# Patient Record
Sex: Female | Born: 1935 | Race: White | Hispanic: No | Smoking: Never smoker
Health system: Southern US, Community
[De-identification: ages and names within clinical notes are randomized; demographics above are authoritative.]

## PROBLEM LIST (undated history)

## (undated) DIAGNOSIS — R634 Abnormal weight loss: Secondary | ICD-10-CM

## (undated) DIAGNOSIS — K922 Gastrointestinal hemorrhage, unspecified: Secondary | ICD-10-CM

## (undated) DIAGNOSIS — E46 Unspecified protein-calorie malnutrition: Secondary | ICD-10-CM

## (undated) DIAGNOSIS — M069 Rheumatoid arthritis, unspecified: Secondary | ICD-10-CM

## (undated) DIAGNOSIS — K649 Unspecified hemorrhoids: Secondary | ICD-10-CM

## (undated) DIAGNOSIS — W19XXXA Unspecified fall, initial encounter: Secondary | ICD-10-CM

## (undated) DIAGNOSIS — A419 Sepsis, unspecified organism: Secondary | ICD-10-CM

## (undated) DIAGNOSIS — R778 Other specified abnormalities of plasma proteins: Secondary | ICD-10-CM

## (undated) DIAGNOSIS — E039 Hypothyroidism, unspecified: Secondary | ICD-10-CM

## (undated) DIAGNOSIS — A0471 Enterocolitis due to Clostridium difficile, recurrent: Secondary | ICD-10-CM

## (undated) DIAGNOSIS — M1990 Unspecified osteoarthritis, unspecified site: Secondary | ICD-10-CM

## (undated) DIAGNOSIS — J189 Pneumonia, unspecified organism: Secondary | ICD-10-CM

## (undated) DIAGNOSIS — R5383 Other fatigue: Secondary | ICD-10-CM

## (undated) DIAGNOSIS — F329 Major depressive disorder, single episode, unspecified: Secondary | ICD-10-CM

## (undated) DIAGNOSIS — I82409 Acute embolism and thrombosis of unspecified deep veins of unspecified lower extremity: Secondary | ICD-10-CM

## (undated) DIAGNOSIS — I2510 Atherosclerotic heart disease of native coronary artery without angina pectoris: Secondary | ICD-10-CM

## (undated) DIAGNOSIS — I10 Essential (primary) hypertension: Secondary | ICD-10-CM

## (undated) DIAGNOSIS — K449 Diaphragmatic hernia without obstruction or gangrene: Secondary | ICD-10-CM

## (undated) DIAGNOSIS — F419 Anxiety disorder, unspecified: Secondary | ICD-10-CM

## (undated) DIAGNOSIS — G459 Transient cerebral ischemic attack, unspecified: Secondary | ICD-10-CM

## (undated) DIAGNOSIS — I639 Cerebral infarction, unspecified: Secondary | ICD-10-CM

## (undated) DIAGNOSIS — R7989 Other specified abnormal findings of blood chemistry: Secondary | ICD-10-CM

## (undated) DIAGNOSIS — N289 Disorder of kidney and ureter, unspecified: Secondary | ICD-10-CM

## (undated) DIAGNOSIS — K279 Peptic ulcer, site unspecified, unspecified as acute or chronic, without hemorrhage or perforation: Secondary | ICD-10-CM

## (undated) DIAGNOSIS — I421 Obstructive hypertrophic cardiomyopathy: Secondary | ICD-10-CM

## (undated) DIAGNOSIS — F32A Depression, unspecified: Secondary | ICD-10-CM

## (undated) DIAGNOSIS — A0472 Enterocolitis due to Clostridium difficile, not specified as recurrent: Secondary | ICD-10-CM

## (undated) DIAGNOSIS — I5032 Chronic diastolic (congestive) heart failure: Secondary | ICD-10-CM

## (undated) DIAGNOSIS — H9201 Otalgia, right ear: Secondary | ICD-10-CM

## (undated) DIAGNOSIS — K219 Gastro-esophageal reflux disease without esophagitis: Secondary | ICD-10-CM

## (undated) DIAGNOSIS — E785 Hyperlipidemia, unspecified: Secondary | ICD-10-CM

## (undated) DIAGNOSIS — R0600 Dyspnea, unspecified: Secondary | ICD-10-CM

## (undated) HISTORY — DX: Gastro-esophageal reflux disease without esophagitis: K21.9

## (undated) HISTORY — DX: Enterocolitis due to Clostridium difficile, recurrent: A04.71

## (undated) HISTORY — DX: Peptic ulcer, site unspecified, unspecified as acute or chronic, without hemorrhage or perforation: K27.9

## (undated) HISTORY — PX: OTHER SURGICAL HISTORY: SHX169

## (undated) HISTORY — DX: Depression, unspecified: F32.A

## (undated) HISTORY — DX: Unspecified protein-calorie malnutrition: E46

## (undated) HISTORY — DX: Otalgia, right ear: H92.01

## (undated) HISTORY — DX: Unspecified hemorrhoids: K64.9

## (undated) HISTORY — PX: COLONOSCOPY: SHX174

## (undated) HISTORY — DX: Disorder of kidney and ureter, unspecified: N28.9

## (undated) HISTORY — DX: Unspecified osteoarthritis, unspecified site: M19.90

## (undated) HISTORY — DX: Essential (primary) hypertension: I10

## (undated) HISTORY — DX: Sepsis, unspecified organism: A41.9

## (undated) HISTORY — DX: Other specified abnormal findings of blood chemistry: R79.89

## (undated) HISTORY — DX: Hyperlipidemia, unspecified: E78.5

## (undated) HISTORY — PX: BACK SURGERY: SHX140

## (undated) HISTORY — DX: Hypothyroidism, unspecified: E03.9

## (undated) HISTORY — DX: Enterocolitis due to Clostridium difficile, not specified as recurrent: A04.72

## (undated) HISTORY — DX: Abnormal weight loss: R63.4

## (undated) HISTORY — DX: Chronic diastolic (congestive) heart failure: I50.32

## (undated) HISTORY — PX: ANKLE SURGERY: SHX546

## (undated) HISTORY — PX: CARDIAC CATHETERIZATION: SHX172

## (undated) HISTORY — DX: Obstructive hypertrophic cardiomyopathy: I42.1

## (undated) HISTORY — DX: Dyspnea, unspecified: R06.00

## (undated) HISTORY — DX: Major depressive disorder, single episode, unspecified: F32.9

## (undated) HISTORY — DX: Other specified abnormalities of plasma proteins: R77.8

## (undated) HISTORY — DX: Rheumatoid arthritis, unspecified: M06.9

## (undated) HISTORY — DX: Other fatigue: R53.83

## (undated) HISTORY — DX: Pneumonia, unspecified organism: J18.9

## (undated) HISTORY — DX: Gastrointestinal hemorrhage, unspecified: K92.2

## (undated) HISTORY — DX: Anxiety disorder, unspecified: F41.9

## (undated) HISTORY — DX: Diaphragmatic hernia without obstruction or gangrene: K44.9

## (undated) HISTORY — PX: KNEE SURGERY: SHX244

---

## 1962-06-30 HISTORY — PX: BREAST SURGERY: SHX581

## 1998-09-10 ENCOUNTER — Encounter: Payer: Self-pay | Admitting: *Deleted

## 1998-09-10 ENCOUNTER — Ambulatory Visit (HOSPITAL_COMMUNITY): Admission: RE | Admit: 1998-09-10 | Discharge: 1998-09-10 | Payer: Self-pay | Admitting: *Deleted

## 1999-02-13 ENCOUNTER — Emergency Department (HOSPITAL_COMMUNITY): Admission: EM | Admit: 1999-02-13 | Discharge: 1999-02-13 | Payer: Self-pay | Admitting: Emergency Medicine

## 1999-02-13 ENCOUNTER — Encounter: Payer: Self-pay | Admitting: Emergency Medicine

## 1999-12-24 ENCOUNTER — Ambulatory Visit (HOSPITAL_COMMUNITY): Admission: RE | Admit: 1999-12-24 | Discharge: 1999-12-24 | Payer: Self-pay | Admitting: Internal Medicine

## 1999-12-24 ENCOUNTER — Encounter: Payer: Self-pay | Admitting: Internal Medicine

## 2000-02-25 ENCOUNTER — Ambulatory Visit (HOSPITAL_COMMUNITY): Admission: RE | Admit: 2000-02-25 | Discharge: 2000-02-25 | Payer: Self-pay | Admitting: Neurology

## 2000-02-25 ENCOUNTER — Encounter: Payer: Self-pay | Admitting: Neurology

## 2000-08-31 ENCOUNTER — Other Ambulatory Visit: Admission: RE | Admit: 2000-08-31 | Discharge: 2000-08-31 | Payer: Self-pay | Admitting: Obstetrics and Gynecology

## 2000-09-13 ENCOUNTER — Encounter: Payer: Self-pay | Admitting: Emergency Medicine

## 2000-09-13 ENCOUNTER — Emergency Department (HOSPITAL_COMMUNITY): Admission: EM | Admit: 2000-09-13 | Discharge: 2000-09-13 | Payer: Self-pay | Admitting: Emergency Medicine

## 2000-09-16 ENCOUNTER — Emergency Department (HOSPITAL_COMMUNITY): Admission: EM | Admit: 2000-09-16 | Discharge: 2000-09-16 | Payer: Self-pay | Admitting: Emergency Medicine

## 2000-09-16 ENCOUNTER — Encounter: Payer: Self-pay | Admitting: Emergency Medicine

## 2000-10-06 ENCOUNTER — Encounter: Payer: Self-pay | Admitting: Orthopaedic Surgery

## 2000-10-08 ENCOUNTER — Inpatient Hospital Stay (HOSPITAL_COMMUNITY): Admission: RE | Admit: 2000-10-08 | Discharge: 2000-10-12 | Payer: Self-pay | Admitting: Orthopaedic Surgery

## 2000-10-12 ENCOUNTER — Inpatient Hospital Stay (HOSPITAL_COMMUNITY): Admission: RE | Admit: 2000-10-12 | Discharge: 2000-10-21 | Payer: Self-pay | Admitting: Cardiology

## 2000-10-20 ENCOUNTER — Encounter: Payer: Self-pay | Admitting: Physical Medicine & Rehabilitation

## 2001-01-06 ENCOUNTER — Ambulatory Visit (HOSPITAL_COMMUNITY): Admission: RE | Admit: 2001-01-06 | Discharge: 2001-01-06 | Payer: Self-pay | Admitting: Gastroenterology

## 2001-01-07 ENCOUNTER — Encounter: Payer: Self-pay | Admitting: Gastroenterology

## 2001-01-07 ENCOUNTER — Ambulatory Visit (HOSPITAL_COMMUNITY): Admission: RE | Admit: 2001-01-07 | Discharge: 2001-01-07 | Payer: Self-pay | Admitting: Gastroenterology

## 2001-03-10 ENCOUNTER — Encounter: Payer: Self-pay | Admitting: Neurological Surgery

## 2001-03-10 ENCOUNTER — Encounter: Admission: RE | Admit: 2001-03-10 | Discharge: 2001-03-10 | Payer: Self-pay | Admitting: Neurological Surgery

## 2001-03-31 ENCOUNTER — Ambulatory Visit (HOSPITAL_COMMUNITY): Admission: RE | Admit: 2001-03-31 | Discharge: 2001-03-31 | Payer: Self-pay | Admitting: *Deleted

## 2001-03-31 ENCOUNTER — Encounter: Payer: Self-pay | Admitting: *Deleted

## 2002-02-04 ENCOUNTER — Ambulatory Visit (HOSPITAL_COMMUNITY): Admission: RE | Admit: 2002-02-04 | Discharge: 2002-02-04 | Payer: Self-pay | Admitting: Neurology

## 2002-07-04 ENCOUNTER — Ambulatory Visit (HOSPITAL_COMMUNITY): Admission: RE | Admit: 2002-07-04 | Discharge: 2002-07-04 | Payer: Self-pay | Admitting: *Deleted

## 2002-07-04 ENCOUNTER — Encounter: Payer: Self-pay | Admitting: *Deleted

## 2002-09-30 ENCOUNTER — Ambulatory Visit (HOSPITAL_COMMUNITY): Admission: RE | Admit: 2002-09-30 | Discharge: 2002-09-30 | Payer: Self-pay | Admitting: Orthopedic Surgery

## 2002-09-30 ENCOUNTER — Encounter: Payer: Self-pay | Admitting: Orthopedic Surgery

## 2002-12-27 ENCOUNTER — Ambulatory Visit: Admission: RE | Admit: 2002-12-27 | Discharge: 2002-12-27 | Payer: Self-pay | Admitting: Orthopedic Surgery

## 2002-12-31 ENCOUNTER — Emergency Department (HOSPITAL_COMMUNITY): Admission: EM | Admit: 2002-12-31 | Discharge: 2002-12-31 | Payer: Self-pay | Admitting: *Deleted

## 2003-07-01 DIAGNOSIS — K922 Gastrointestinal hemorrhage, unspecified: Secondary | ICD-10-CM

## 2003-07-01 HISTORY — DX: Gastrointestinal hemorrhage, unspecified: K92.2

## 2003-08-31 ENCOUNTER — Encounter: Admission: RE | Admit: 2003-08-31 | Discharge: 2003-08-31 | Payer: Self-pay | Admitting: Orthopedic Surgery

## 2003-09-05 ENCOUNTER — Ambulatory Visit (HOSPITAL_COMMUNITY): Admission: RE | Admit: 2003-09-05 | Discharge: 2003-09-05 | Payer: Self-pay | Admitting: Orthopedic Surgery

## 2003-09-05 ENCOUNTER — Ambulatory Visit (HOSPITAL_BASED_OUTPATIENT_CLINIC_OR_DEPARTMENT_OTHER): Admission: RE | Admit: 2003-09-05 | Discharge: 2003-09-05 | Payer: Self-pay | Admitting: Orthopedic Surgery

## 2003-09-07 ENCOUNTER — Inpatient Hospital Stay (HOSPITAL_COMMUNITY): Admission: AD | Admit: 2003-09-07 | Discharge: 2003-09-09 | Payer: Self-pay | Admitting: Internal Medicine

## 2003-09-08 ENCOUNTER — Encounter: Payer: Self-pay | Admitting: Cardiology

## 2003-10-10 ENCOUNTER — Encounter (HOSPITAL_COMMUNITY): Admission: RE | Admit: 2003-10-10 | Discharge: 2004-01-08 | Payer: Self-pay | Admitting: Internal Medicine

## 2004-01-11 ENCOUNTER — Ambulatory Visit (HOSPITAL_COMMUNITY): Admission: RE | Admit: 2004-01-11 | Discharge: 2004-01-11 | Payer: Self-pay | Admitting: Neurology

## 2004-01-24 ENCOUNTER — Inpatient Hospital Stay (HOSPITAL_COMMUNITY): Admission: RE | Admit: 2004-01-24 | Discharge: 2004-01-27 | Payer: Self-pay | Admitting: Orthopedic Surgery

## 2004-06-04 ENCOUNTER — Ambulatory Visit (HOSPITAL_COMMUNITY): Admission: RE | Admit: 2004-06-04 | Discharge: 2004-06-04 | Payer: Self-pay | Admitting: Surgery

## 2004-06-09 ENCOUNTER — Emergency Department (HOSPITAL_COMMUNITY): Admission: EM | Admit: 2004-06-09 | Discharge: 2004-06-09 | Payer: Self-pay | Admitting: Emergency Medicine

## 2004-06-11 ENCOUNTER — Ambulatory Visit (HOSPITAL_COMMUNITY): Admission: RE | Admit: 2004-06-11 | Discharge: 2004-06-11 | Payer: Self-pay | Admitting: Ophthalmology

## 2004-07-23 ENCOUNTER — Ambulatory Visit (HOSPITAL_COMMUNITY): Admission: RE | Admit: 2004-07-23 | Discharge: 2004-07-23 | Payer: Self-pay | Admitting: Gastroenterology

## 2004-09-14 ENCOUNTER — Inpatient Hospital Stay (HOSPITAL_COMMUNITY): Admission: EM | Admit: 2004-09-14 | Discharge: 2004-09-17 | Payer: Self-pay | Admitting: Emergency Medicine

## 2005-05-27 ENCOUNTER — Ambulatory Visit (HOSPITAL_COMMUNITY): Admission: RE | Admit: 2005-05-27 | Discharge: 2005-05-27 | Payer: Self-pay | Admitting: Internal Medicine

## 2005-08-28 ENCOUNTER — Ambulatory Visit: Payer: Self-pay | Admitting: Internal Medicine

## 2006-04-23 ENCOUNTER — Encounter: Admission: RE | Admit: 2006-04-23 | Discharge: 2006-04-23 | Payer: Self-pay | Admitting: Obstetrics and Gynecology

## 2006-10-26 ENCOUNTER — Emergency Department (HOSPITAL_COMMUNITY): Admission: EM | Admit: 2006-10-26 | Discharge: 2006-10-27 | Payer: Self-pay | Admitting: Emergency Medicine

## 2007-01-24 ENCOUNTER — Inpatient Hospital Stay (HOSPITAL_COMMUNITY): Admission: EM | Admit: 2007-01-24 | Discharge: 2007-01-26 | Payer: Self-pay | Admitting: Emergency Medicine

## 2007-03-18 ENCOUNTER — Ambulatory Visit (HOSPITAL_COMMUNITY): Admission: RE | Admit: 2007-03-18 | Discharge: 2007-03-18 | Payer: Self-pay | Admitting: Gastroenterology

## 2007-08-19 ENCOUNTER — Inpatient Hospital Stay (HOSPITAL_COMMUNITY): Admission: RE | Admit: 2007-08-19 | Discharge: 2007-08-23 | Payer: Self-pay | Admitting: Orthopaedic Surgery

## 2007-08-27 ENCOUNTER — Inpatient Hospital Stay (HOSPITAL_COMMUNITY): Admission: EM | Admit: 2007-08-27 | Discharge: 2007-08-31 | Payer: Self-pay | Admitting: Emergency Medicine

## 2007-11-23 ENCOUNTER — Encounter: Admission: RE | Admit: 2007-11-23 | Discharge: 2007-11-23 | Payer: Self-pay | Admitting: Internal Medicine

## 2007-11-27 ENCOUNTER — Emergency Department (HOSPITAL_COMMUNITY): Admission: EM | Admit: 2007-11-27 | Discharge: 2007-11-28 | Payer: Self-pay | Admitting: Emergency Medicine

## 2007-12-03 ENCOUNTER — Ambulatory Visit (HOSPITAL_COMMUNITY): Admission: RE | Admit: 2007-12-03 | Discharge: 2007-12-03 | Payer: Self-pay | Admitting: Internal Medicine

## 2007-12-07 ENCOUNTER — Encounter: Admission: RE | Admit: 2007-12-07 | Discharge: 2007-12-07 | Payer: Self-pay | Admitting: Internal Medicine

## 2008-02-24 ENCOUNTER — Emergency Department (HOSPITAL_COMMUNITY): Admission: EM | Admit: 2008-02-24 | Discharge: 2008-02-24 | Payer: Self-pay | Admitting: Emergency Medicine

## 2010-06-30 HISTORY — PX: OTHER SURGICAL HISTORY: SHX169

## 2010-07-06 ENCOUNTER — Inpatient Hospital Stay (HOSPITAL_COMMUNITY)
Admission: EM | Admit: 2010-07-06 | Discharge: 2010-07-12 | Disposition: A | Discharge status: Other Acute Inpatient Hospital | Payer: Self-pay | Source: Home / Self Care | Attending: Cardiology | Admitting: Cardiology

## 2010-07-09 ENCOUNTER — Encounter (INDEPENDENT_AMBULATORY_CARE_PROVIDER_SITE_OTHER): Payer: Self-pay | Admitting: Internal Medicine

## 2010-07-15 LAB — CBC
HCT: 32.3 % — ABNORMAL LOW (ref 36.0–46.0)
HCT: 30.1 % — ABNORMAL LOW (ref 36.0–46.0)
HCT: 32.2 % — ABNORMAL LOW (ref 36.0–46.0)
HCT: 33.1 % — ABNORMAL LOW (ref 36.0–46.0)
HCT: 32.8 % — ABNORMAL LOW (ref 36.0–46.0)
Hemoglobin: 10.6 g/dL — ABNORMAL LOW (ref 12.0–15.0)
Hemoglobin: 9.8 g/dL — ABNORMAL LOW (ref 12.0–15.0)
Hemoglobin: 10.3 g/dL — ABNORMAL LOW (ref 12.0–15.0)
Hemoglobin: 10.8 g/dL — ABNORMAL LOW (ref 12.0–15.0)
Hemoglobin: 10.7 g/dL — ABNORMAL LOW (ref 12.0–15.0)
MCH: 30.1 pg (ref 26.0–34.0)
MCH: 30.5 pg (ref 26.0–34.0)
MCH: 29.8 pg (ref 26.0–34.0)
MCH: 30 pg (ref 26.0–34.0)
MCH: 29.7 pg (ref 26.0–34.0)
MCHC: 32.8 g/dL (ref 30.0–36.0)
MCHC: 32.6 g/dL (ref 30.0–36.0)
MCHC: 32 g/dL (ref 30.0–36.0)
MCHC: 32.6 g/dL (ref 30.0–36.0)
MCHC: 32.6 g/dL (ref 30.0–36.0)
MCV: 91.8 fL (ref 78.0–100.0)
MCV: 93.8 fL (ref 78.0–100.0)
MCV: 93.1 fL (ref 78.0–100.0)
MCV: 91.9 fL (ref 78.0–100.0)
MCV: 91.1 fL (ref 78.0–100.0)
Platelets: 210 10*3/uL (ref 150–400)
Platelets: 195 10*3/uL (ref 150–400)
Platelets: 206 10*3/uL (ref 150–400)
Platelets: 205 10*3/uL (ref 150–400)
Platelets: 195 10*3/uL (ref 150–400)
RBC: 3.52 MIL/uL — ABNORMAL LOW (ref 3.87–5.11)
RBC: 3.21 MIL/uL — ABNORMAL LOW (ref 3.87–5.11)
RBC: 3.46 MIL/uL — ABNORMAL LOW (ref 3.87–5.11)
RBC: 3.6 MIL/uL — ABNORMAL LOW (ref 3.87–5.11)
RBC: 3.6 MIL/uL — ABNORMAL LOW (ref 3.87–5.11)
RDW: 14.1 % (ref 11.5–15.5)
RDW: 14.4 % (ref 11.5–15.5)
RDW: 14.2 % (ref 11.5–15.5)
RDW: 14.2 % (ref 11.5–15.5)
RDW: 14.1 % (ref 11.5–15.5)
WBC: 9.7 10*3/uL (ref 4.0–10.5)
WBC: 8.8 10*3/uL (ref 4.0–10.5)
WBC: 10.5 10*3/uL (ref 4.0–10.5)
WBC: 8.5 10*3/uL (ref 4.0–10.5)
WBC: 12 10*3/uL — ABNORMAL HIGH (ref 4.0–10.5)

## 2010-07-15 LAB — BASIC METABOLIC PANEL
BUN: 19 mg/dL (ref 6–23)
BUN: 16 mg/dL (ref 6–23)
BUN: 20 mg/dL (ref 6–23)
BUN: 26 mg/dL — ABNORMAL HIGH (ref 6–23)
CO2: 25 mEq/L (ref 19–32)
CO2: 27 mEq/L (ref 19–32)
CO2: 26 mEq/L (ref 19–32)
CO2: 26 mEq/L (ref 19–32)
Calcium: 8.3 mg/dL — ABNORMAL LOW (ref 8.4–10.5)
Calcium: 8.8 mg/dL (ref 8.4–10.5)
Calcium: 8.7 mg/dL (ref 8.4–10.5)
Calcium: 9 mg/dL (ref 8.4–10.5)
Chloride: 109 mEq/L (ref 96–112)
Chloride: 108 mEq/L (ref 96–112)
Chloride: 106 mEq/L (ref 96–112)
Chloride: 106 mEq/L (ref 96–112)
Creatinine, Ser: 1.16 mg/dL (ref 0.4–1.2)
Creatinine, Ser: 1.21 mg/dL — ABNORMAL HIGH (ref 0.4–1.2)
Creatinine, Ser: 1.31 mg/dL — ABNORMAL HIGH (ref 0.4–1.2)
Creatinine, Ser: 1.39 mg/dL — ABNORMAL HIGH (ref 0.4–1.2)
GFR calc Af Amer: 55 mL/min — ABNORMAL LOW (ref >60)
GFR calc Af Amer: 52 mL/min — ABNORMAL LOW (ref >60)
GFR calc Af Amer: 48 mL/min — ABNORMAL LOW (ref >60)
GFR calc Af Amer: 44 mL/min — ABNORMAL LOW (ref >60)
GFR calc non Af Amer: 45 mL/min — ABNORMAL LOW (ref >60)
GFR calc non Af Amer: 43 mL/min — ABNORMAL LOW (ref >60)
GFR calc non Af Amer: 39 mL/min — ABNORMAL LOW (ref >60)
GFR calc non Af Amer: 37 mL/min — ABNORMAL LOW (ref >60)
Glucose, Bld: 98 mg/dL (ref 70–99)
Glucose, Bld: 83 mg/dL (ref 70–99)
Glucose, Bld: 89 mg/dL (ref 70–99)
Glucose, Bld: 90 mg/dL (ref 70–99)
Potassium: 3.9 mEq/L (ref 3.5–5.1)
Potassium: 4.1 mEq/L (ref 3.5–5.1)
Potassium: 4 mEq/L (ref 3.5–5.1)
Potassium: 4.2 mEq/L (ref 3.5–5.1)
Sodium: 141 mEq/L (ref 135–145)
Sodium: 141 mEq/L (ref 135–145)
Sodium: 140 mEq/L (ref 135–145)
Sodium: 139 mEq/L (ref 135–145)

## 2010-07-15 LAB — POCT I-STAT, CHEM 8
BUN: 27 mg/dL — ABNORMAL HIGH (ref 6–23)
Calcium, Ion: 1.11 mmol/L — ABNORMAL LOW (ref 1.12–1.32)
Chloride: 107 mEq/L (ref 96–112)
Creatinine, Ser: 1.9 mg/dL — ABNORMAL HIGH (ref 0.4–1.2)
Glucose, Bld: 132 mg/dL — ABNORMAL HIGH (ref 70–99)
HCT: 31 % — ABNORMAL LOW (ref 36.0–46.0)
Hemoglobin: 10.5 g/dL — ABNORMAL LOW (ref 12.0–15.0)
Potassium: 3.8 mEq/L (ref 3.5–5.1)
Sodium: 139 mEq/L (ref 135–145)
TCO2: 27 mmol/L (ref 0–100)

## 2010-07-15 LAB — HEPARIN LEVEL (UNFRACTIONATED)
Heparin Unfractionated: 0.37 IU/mL (ref 0.30–0.70)
Heparin Unfractionated: 0.35 IU/mL (ref 0.30–0.70)
Heparin Unfractionated: 0.23 IU/mL — ABNORMAL LOW (ref 0.30–0.70)

## 2010-07-15 LAB — POCT CARDIAC MARKERS
CKMB, poc: 6.3 ng/mL (ref 1.0–8.0)
Myoglobin, poc: 256 ng/mL (ref 12–200)
Troponin i, poc: 0.31 ng/mL (ref 0.00–0.09)

## 2010-07-15 LAB — MRSA PCR SCREENING
MRSA by PCR: NEGATIVE
MRSA by PCR: NEGATIVE

## 2010-07-15 LAB — CARDIAC PANEL(CRET KIN+CKTOT+MB+TROPI)
CK, MB: 10.8 ng/mL (ref 0.3–4.0)
Relative Index: INVALID (ref 0.0–2.5)
Total CK: 85 U/L (ref 7–177)
Troponin I: 1.56 ng/mL (ref 0.00–0.06)

## 2010-07-15 LAB — PROTIME-INR
INR: 0.92 (ref 0.00–1.49)
Prothrombin Time: 12.6 seconds (ref 11.6–15.2)

## 2010-07-15 LAB — MAGNESIUM: Magnesium: 1.5 mg/dL (ref 1.5–2.5)

## 2010-07-15 LAB — TROPONIN I: Troponin I: 0.72 ng/mL (ref 0.00–0.06)

## 2010-07-21 ENCOUNTER — Encounter: Payer: Self-pay | Admitting: Internal Medicine

## 2010-07-21 NOTE — Discharge Summary (Addendum)
[name], [name]               ACCOUNT NO.:  [id number]  MEDICAL RECORD NO.:  [id number]          PATIENT TYPE:  INP  LOCATION:  2005                         FACILITY:  [facility]  PHYSICIAN:  Bevelyn Buckles. Bensimhon, MDDATE OF BIRTH:  10-Feb-1930  DATE OF ADMISSION:  07/06/2010 DATE OF DISCHARGE:  07/12/2010                              DISCHARGE SUMMARY   PRIMARY CARDIOLOGIST:  Colleen Can. Deborah Chalk, M.D.  DISCHARGE DIAGNOSES: 1. Non-ST-elevation myocardial infarction. 2. Nonobstructive ruptured plaque in the left anterior descending     artery.  SECONDARY DIAGNOSES: 1. Rheumatoid arthritis on chronic prednisone. 2. Hypertension. 3. Peptic ulcer disease with significant gastrointestinal bleed in     2005. 4. Renal insufficiency. 5. Deconditioning.  HOSPITAL COURSE:  [name] is a very pleasant 77 year old woman with a history of rheumatoid arthritis, hypertension, and previous GI bleed. She was admitted on July 06, 2010, with chest pain radiating to her left shoulder.  She was started on heparin.  Cardiac markers came back positive for a non-ST-elevation myocardial infarction with a peak troponin of 1.56.  Her EKG was nonacute.  Echocardiogram showed normal LV function.  Initially there was some hesitation to proceed with cardiac catheterization due to her renal insufficiency.  Her creatinine on admission was 1.9.  However, with hydration this came down to the 1.2 range.  She underwent cardiac catheterization by me on July 08, 2010. This showed the left main was normal.  The LAD had what appeared to be 2 areas of ruptured plaque which were nonobstructive with only a 30% to 40% stenosis.  There was TIMI 3 flow throughout the whole vessel.  The distal LAD had a 95% lesion near the apex.  The left circumflex gave off an OM-1 and OM-2 and was angiographically normal.  The right coronary artery was a large dominant vessel with just 30% and 40% lesions in the midsection.  An  LV-gram was not performed due to efforts to limit contrast exposure.  In the cath lab, we had a long discussion with Dr. Swaziland and myself about the correct approach to her coronary anatomy.  Ideally we would have considered stenting her LAD in the setting of the ruptured plaque. However, the plaque was nonobstructive, she was pain-free, and we felt she would be a high risk for recurrent GI bleeding in the setting of long-term dual antiplatelet therapy.  We thus decided on a course of medical therapy.  She was treated with aspirin and Brilinta with close monitoring initially in the step-down unit and on the telemetry floor. It was felt that if she had recurrent pain that we would take her back to the cath lab and stent.  Fortunately she remained symptom-free and progressed quite nicely.  Her post cath course was relatively uncomplicated except for significant deconditioning.  She was seen by initially cardiac rehab and then physical therapy, who suggested a short stay a skilled nursing facility to build her strength back up.Fortunately her renal function did not worsen.  Her creatinine stayed in the 1.2 to 1.4 range.  We did have some problems with her blood pressure and her Lopressor was titrated up  to 75 mg b.i.d.  She was felt stable for discharge on July 12, 2010, and a bed at [name] was secured.  This was discussed with her and her family, who are in agreement.  MEDICATIONS ON DISCHARGE: 1. Lopressor 75 mg b.i.d. 2. Aspirin 81 mg a day. 3. Brilinta 90 mg b.i.d.  We will continue this for hopefully at least     a month, if not slightly longer.  Other medicines on discharge will be: 1. Prednisone 5 mg a day, which is her chronic dose. 2. Valium 5 mg p.o. at bedtime. 3. Synthroid 50 mcg a day. 4. Sertraline 50 mg a day. 5. Nexium 40 mg a day. 6. Meclizine 12.5 mg p.r.n. for vertigo. 7. Lisinopril/hydrochlorothiazide 20/12.5 a day. 8. Imipramine 50 mg once a day. 9.  Also we will add Crestor 40 mg once a day. 10.Nitroglycerin 0.4 mg sublingual p.r.n. for chest pain.  Of note, we are stopping her diltiazem.  FOLLOWUP AFTER DISCHARGE:  She will be discharged the [facility] for skilled nursing and rehabilitation.  When she gets discharged from there, hopefully she will be able to attend cardiac rehab.  She will follow up with Dr. Deborah Chalk in 2 to 4 weeks after discharge.  I would recommend getting a BMET to recheck her renal function in 1 week at the [facility].     Bevelyn Buckles. Bensimhon, MD     DRB/MEDQ  D:  07/12/2010  T:  07/12/2010  Job:  [id number]  Electronically Signed by Arvilla Meres MD on 07/21/2010 07:09:17 PM

## 2010-07-21 NOTE — Procedures (Addendum)
NAMEDECLYN, OFFIELD               ACCOUNT NO.:  [id number]  MEDICAL RECORD NO.:  [id number]           PATIENT TYPE:  LOCATION:                                 FACILITY:  PHYSICIAN:  Bevelyn Buckles. Bensimhon, MDDATE OF BIRTH:  09/20/1929  DATE OF PROCEDURE:  07/08/2010 DATE OF DISCHARGE:                           CARDIAC CATHETERIZATION   THE PATIENT IDENTIFICATION:  [name] is a 77 year old woman with multiple medical problems including rheumatoid arthritis, on chronic prednisone; hypertension; gastroesophageal reflux disease; and spinal fusion.  She has a history of peptic ulcer disease dating back to 2005. She was admitted with non-ST-elevation myocardial infarction.  She is referred for cardiac catheterization.  Of note, prior to the catheterization, her creatinine was elevated at 1.9.  She was hydrated on the day of catheterization.  Her creatinine was back down in the 1.2 range.  There was a long discussion regarding the risks and indications of cardiac catheterization with specific attention to the risk of renal failure.  She has agreed to proceed.  PROCEDURES PERFORMED: 1. Selective coronary angiography. 2. Left heart cath.  DESCRIPTION OF THE PROCEDURE:  The right groin area was prepped and draped in routine sterile fashion, anesthetized with 1% local lidocaine. Standard catheters, including a JL-4 and JR-4, were used for procedure. All catheter exchanges made over wire.  There were no apparent complications.  Central aortic pressure 169/86 with a mean of 125.  LV pressure 180/6 with an EDP of 12.  There was no aortic stenosis.  Total contrast used was 40 mL.  Left main was short, angiographically normal.  The LAD had an ostial 20% lesion, followed by a very mild filling defect/ruptured plaque in the proximal portion.  In the midsection of the LAD, there was an area of a ruptured plaque with about 30-40% stenosis.  This was nonobstructive.  There was TIMI-3 flow  and following this, in the mid LAD, there was a 40% lesion.  In the apical LAD, there was a 95% tubular lesion which was small.  There were two diagonal branches.  In the ostial portion of the second diagonal, there was a 50- 60% lesion.  Left circumflex is made up of an OM-1 and OM-2 and was angiographically normal.  Right coronary artery was a dominant vessel and gave off acute marginal, PDA, and a posterolateral.  It had a 30% lesion proximally and 40% lesion in the midsection.  Left ventriculogram was not done in an effort to limit contrast exposure.  ASSESSMENT:  Coronary artery disease with evidence of two ruptured plaques in the proximal and mid left anterior descending with high-grade distal left anterior descending disease.  Otherwise, there is mild nonobstructive plaquing in the other vessels.  Given that her plaques are currently nonobstructive and she is at high risk for gastrointestinal bleeding with long-term dual antiplatelet therapy, we have decided to treat her medically.  We initially considered using Integrilin but once again, we are worried about her risk of gastrointestinal bleeding. We will load her on Plavix and try to treat her for at least several weeks with Plavix.  Should she develop recurrent symptoms, she  will likely need relook cath and possible stenting.  I have discussed this with doctors, Dr. Peter Swaziland and Dr. Tonny Bollman.     Bevelyn Buckles. Bensimhon, MD     DRB/MEDQ  D:  07/08/2010  T:  07/09/2010  Job:  [id number]  Electronically Signed by Arvilla Meres MD on 07/21/2010 07:09:21 PM

## 2010-07-29 ENCOUNTER — Ambulatory Visit: Payer: Self-pay | Admitting: Cardiology

## 2010-08-14 NOTE — H&P (Signed)
NAMEILLEANA, EDICK NO.:  [id number]  MEDICAL RECORD NO.:  [id number]          PATIENT TYPE:  EMS  LOCATION:  MAJO                         FACILITY:  [facility]  PHYSICIAN:  Karn Cassis, MD  DATE OF BIRTH:  [birth year]  DATE OF ADMISSION:  07/06/2010 DATE OF DISCHARGE:                             HISTORY & PHYSICAL   CARDIOLOGIST:  Colleen Can. Deborah Chalk, M.D.  CHIEF COMPLAINT:  Chest pain radiating to the jaw, arm, and back.  HISTORY OF PRESENT ILLNESS:  Ms. Basaldua is a 77 year old lady with history of rheumatoid arthritis, hypertension, osteoarthritis, who is being admitted for unstable angina.  HISTORY OF PRESENT ILLNESS:  Ms. Kittleson was in her usual state of health up until 5:00 p.m. tonight when she developed back pain which is described as dull, achy sensation radiating up to the left jaw and down to the left arm.  The pain lasted for up to 1 hour until she came to the emergency department.  She reports that there is no exertional component to the pain.  The pain occurred at rest.  She has had similar pain in the past which has resolved spontaneously.  She mentions that the last time this occurred was about a month ago but she did not seek medical attention at that point.  When she arrived to the emergency department, her first set of cardiac markers showed a troponin of 0.7.  CT of the chest was performed which shows a large hiatal hernia, although the CT was without contrast secondary to her chronic kidney disease with a creatinine of 1.9.  The CT of the chest also showed significant amounts of coronary calcification.  Ms. Pascarella has no known history of coronary disease.  She recalls that she had stress test several years ago and reports that she was told that the results were normal.  Our electronic medical system also shows transthoracic echo which was performed in 2005.  At that point, her high EF was read as normal with EF range between  55-65%, normal LV size, and no significant valvular disease except for mild mitral regurgitation. In the emergency department, she was started on heparin and nitroglycerin drip.  On my interview, she is chest pain free.  REVIEW OF SYSTEMS:  A 12-point review of systems is negative except for symptoms outlined in HPI above in.  In addition, she complains of history of significant lower back pain.  She has on spinal fusion surgery at [facility].  She also complains of history of bilateral knee pain and has had knee surgery as well.  PAST MEDICAL HISTORY: 1. Rheumatoid arthritis, on chronic prednisone. 2. Hypertension. 3. Gastroesophageal reflux disease. 4. Hiatal hernia. 5. Hypothyroidism. 6. Previous H and P from 2009 notes of remote CVA, however the patient     denies this diagnosis. 7. History of chronic back pain, status post spinal fusion and     compression fractures. 8. Osteoporosis. 9. Depression. 10.History of peptic ulcer in 2005.  SOCIAL HISTORY:  She lives alone.  She is a widow since April 2008.  She has three children.  Her son, Ramon Dredge, has her power for  attorney.  She has eight grandchildren.  She denies any history of tobacco, alcohol, or illicit drug use.  FAMILY HISTORY:  Noncontributory.  ALLERGIES:  She is allergic to SULFA which causes moodiness; CODEINE, unknown reaction; PLAVIX which causes a rash; and MORPHINE which causes hallucinations.  MEDICATIONS:  Her home medications are: 1. Diazepam 5 mg once a day. 2. Aspirin 81 mg once a day. 3. Synthroid 50 mcg once a day. 4. Sertraline 50 mg once a day. 5. Prednisone 5 mg once a day. 6. Nexium 40 mg once a day. 7. Meclizine 12.5 mg once a day as needed for vertigo. 8. Lisinopril and HCTZ combination 20/12.5 mg once a day. 9. Diltiazem CD 240 mg once a day. 10.Imipramine 50 mg once a day.  PHYSICAL EXAM:  VITAL SIGNS:  Her blood pressure in the right arm was 127/82, blood pressure in the left arm was  130/84, heart rate of 104, respiratory rate is 19.  She saturating 96% on room air. GENERAL:  The patient is a well-appearing lady in no apparent distress. CARDIOVASCULAR:  Regular tachycardia.  Normal S1, S2.  I did not hear any murmurs, gallops, or rubs. LUNGS: Clear to auscultation bilaterally. ABDOMEN:  Soft, nontender, nondistended.  Normal bowel sounds. EXTREMITIES: No signs of clubbing, cyanosis, or edema. SKIN: No rashes or lesions. PSYCH:  Alert and oriented to time, place, and person.  LABORATORY DATA:  Her blood work shows a sodium of 139, potassium 3.8, chloride 107, bicarb is 27, creatinine of 1.9, BUN 27.  Her first set of cardiac markers show a troponin of 0.7, MB of 6.3 and myoglobin level of 256.  CBC shows a hematocrit of 31.0.  White count is currently pending.  EKG shows sinus rhythm with inferior old infarct.  ASSESSMENT AND PLAN:  In summary, this is a 77 year old lady with no known coronary disease who presents to the emergency department with chest pain radiating to the jaw, back, and down the left arm.  The pain resolved after nitroglycerin.  Pain is consistent with unstable angina and resolved after initiation of nitroglycerin drip. 1. Unstable angina:  The patient's CT scan shows a coronary artery     calcification, although, she does not carry a diagnosis of     obstructive coronary artery disease.  We will treat with aspirin,     beta blockers, statin, heparin, and nitroglycerin tonight.  I will     also start her on gentle fluid hydration since her creatinine level     is elevated at 1.9 and she may need cardiac catheterization in the     near future.  We will continue to cycle her cardiac markers.  I had     also considered aortic dissection in the differential diagnosis.     Her blood pressure which is equal in both arms, absence of aortic     aneurysm or coarctation on the CT without contrast, absence of     mediastinal widening, and relief of  symptoms with nitroglycerin,     indicates that her symptoms are more consistent with coronary     artery disease. 2. History of rheumatoid arthritis:  Continue her on low-dose     prednisone 5 mg once a day. 3. Kidney disease:  The patient does not carry a diagnosis of chronic     kidney disease and her last creatinine during her last     hospitalization and August 2009 was 1.3 with a GFR of 40.  For now,  I will decrease the dose of her ACE inhibitor from lisinopril 20 mg     to 10 mg and hold her thiazide diuretics.  These could be restarted     once her creatinine is closer towards her baseline.  I will start     her on normal saline at 80 mL an hour for the next 24 hours. 4. History of hypothyroidism:  Continue Synthroid 50 mcg once a day. 5. History of hiatal hernia and gastroesophageal reflux disease:     Continue Nexium 40 mg once a day.     Karn Cassis, MD     PV/MEDQ  D:  07/07/2010  T:  07/07/2010  Job:  [id number]  Electronically Signed by Karn Cassis MD on 08/14/2010 01:22:29 PM

## 2010-08-30 ENCOUNTER — Ambulatory Visit (INDEPENDENT_AMBULATORY_CARE_PROVIDER_SITE_OTHER): Payer: Medicare Other | Admitting: Cardiology

## 2010-08-30 DIAGNOSIS — I214 Non-ST elevation (NSTEMI) myocardial infarction: Secondary | ICD-10-CM

## 2010-08-30 DIAGNOSIS — R0602 Shortness of breath: Secondary | ICD-10-CM

## 2010-08-30 DIAGNOSIS — I2510 Atherosclerotic heart disease of native coronary artery without angina pectoris: Secondary | ICD-10-CM

## 2010-08-30 DIAGNOSIS — E7800 Pure hypercholesterolemia, unspecified: Secondary | ICD-10-CM

## 2010-10-28 ENCOUNTER — Other Ambulatory Visit: Payer: Self-pay | Admitting: *Deleted

## 2010-10-28 DIAGNOSIS — E7800 Pure hypercholesterolemia, unspecified: Secondary | ICD-10-CM

## 2010-11-08 ENCOUNTER — Ambulatory Visit: Payer: PRIVATE HEALTH INSURANCE | Admitting: Cardiology

## 2010-11-08 ENCOUNTER — Telehealth: Payer: Self-pay | Admitting: Cardiology

## 2010-11-08 NOTE — Telephone Encounter (Signed)
Calling because she wanted to have a hemoglobin blood test done on her visit next Wednesday. Please call back. I couldn't find the file.

## 2010-11-08 NOTE — Telephone Encounter (Signed)
RN set pt up for CBC on 11/13/10.  Pt notified.

## 2010-11-11 ENCOUNTER — Encounter: Payer: Self-pay | Admitting: Cardiology

## 2010-11-11 DIAGNOSIS — M1990 Unspecified osteoarthritis, unspecified site: Secondary | ICD-10-CM | POA: Insufficient documentation

## 2010-11-11 DIAGNOSIS — K922 Gastrointestinal hemorrhage, unspecified: Secondary | ICD-10-CM | POA: Insufficient documentation

## 2010-11-11 DIAGNOSIS — R5383 Other fatigue: Secondary | ICD-10-CM | POA: Insufficient documentation

## 2010-11-11 DIAGNOSIS — F329 Major depressive disorder, single episode, unspecified: Secondary | ICD-10-CM | POA: Insufficient documentation

## 2010-11-11 DIAGNOSIS — N289 Disorder of kidney and ureter, unspecified: Secondary | ICD-10-CM | POA: Insufficient documentation

## 2010-11-11 DIAGNOSIS — I219 Acute myocardial infarction, unspecified: Secondary | ICD-10-CM | POA: Insufficient documentation

## 2010-11-11 DIAGNOSIS — K279 Peptic ulcer, site unspecified, unspecified as acute or chronic, without hemorrhage or perforation: Secondary | ICD-10-CM | POA: Insufficient documentation

## 2010-11-11 DIAGNOSIS — K219 Gastro-esophageal reflux disease without esophagitis: Secondary | ICD-10-CM | POA: Insufficient documentation

## 2010-11-11 DIAGNOSIS — H9201 Otalgia, right ear: Secondary | ICD-10-CM | POA: Insufficient documentation

## 2010-11-11 DIAGNOSIS — E039 Hypothyroidism, unspecified: Secondary | ICD-10-CM | POA: Insufficient documentation

## 2010-11-11 DIAGNOSIS — R630 Anorexia: Secondary | ICD-10-CM | POA: Insufficient documentation

## 2010-11-11 DIAGNOSIS — F419 Anxiety disorder, unspecified: Secondary | ICD-10-CM | POA: Insufficient documentation

## 2010-11-11 DIAGNOSIS — K449 Diaphragmatic hernia without obstruction or gangrene: Secondary | ICD-10-CM | POA: Insufficient documentation

## 2010-11-11 DIAGNOSIS — M069 Rheumatoid arthritis, unspecified: Secondary | ICD-10-CM | POA: Insufficient documentation

## 2010-11-11 DIAGNOSIS — R0602 Shortness of breath: Secondary | ICD-10-CM | POA: Insufficient documentation

## 2010-11-11 DIAGNOSIS — M810 Age-related osteoporosis without current pathological fracture: Secondary | ICD-10-CM | POA: Insufficient documentation

## 2010-11-12 NOTE — Discharge Summary (Signed)
[name], [name] NO.:  [id number]   MEDICAL RECORD NO.:  [id number]          PATIENT TYPE:  INP   LOCATION:  1334                         FACILITY:  [facility]   PHYSICIAN:  Geoffry Paradise, M.D.  DATE OF BIRTH:  [birth year]   DATE OF ADMISSION:  08/27/2007  DATE OF DISCHARGE:  08/31/2007                               DISCHARGE SUMMARY   DISCHARGE DIAGNOSES:  1. Enterobacter urinary tract infection sensitive to Rocephin and      Cipro.  2. Sepsis with abnormal liver function testing.  3. Anemia of chronic disease requiring transfusion.  4. Probable mild adrenal insufficiency.  5. Steroid dependent rheumatoid arthritis.  6. Essential hypertension.  7. Status post left total knee replacement on Coumadin.  8. Acute renal failure secondary to sepsis, resolved.  9. Gastroesophageal reflux disease.   HISTORY OF PRESENT ILLNESS:  [name] is a pleasant, 77 year old who  is status post hospitalization from February 19  to February 23 and  ultimate discharge to [facility] readmitted at this time with  mental status changes and fever.  She had a fairly uneventful left total  knee replacement, did well and was sent to [facility] nursing [facility] for  further management.  According to her son who was the best historian at  the time of admission, she had been ill for 2-3 days with some subtle  mental status changes, but as of the day of admission presented somewhat  toxic-appearing, tachycardic and febrile for further evaluation.  She  was admitted with acute renal failure, fever and mental status changes  felt secondary to her urinary tract infection and septic syndrome.  For  details, see the dictated summary on the chart August 27, 2007.   DATA:  Urinalysis positive nitrate, large leukocyte esterase.  Culture,  Enterobacter cloacae sensitive to Cipro and Rocephin.  Chemistries,  sodium 135, potassium 4.9, chloride 99, CO2 27, glucose 134, BUN 45,  creatinine  1.28, calcium 8.6.  CBC, hemoglobin 8.1, hematocrit 23.7,  white blood count 18.1, platelet count is 391,000.  Chest x-ray mild  atelectasis, small left effusion, no acute disease.   HOSPITAL COURSE:  The patient was admitted, placed on stress doses of IV  steroids, combination of Rocephin and Cipro and continuation of some  basic outpatient medications. Within 24-48 hours,  she mentally cleared,  blood pressure remained stable, renal function normalized.  Steroids  were converted to low-dose oral steroids, largely for her rheumatoid  arthritis.  She continued on a combination of Tylenol and Vicodin for  fever and pain control.  At no point did the left knee appear infected.  She was transfused two rounds, total of 4 units of packed red blood  cells, some of it was chronic disease,  some of it was postop blood  loss. No evidence of any GI bleeding.  Incidentally she has had a fairly  extensive GI workup from Dr. Matthias Hughs for GI blood loss which has been  negative.  She is chronically on proton pump inhibitors.  There is no  contraindication for Coumadin and this will be continued for DVT  prophylaxis.  She is discharged back to skilled nursing center medically  stable to resume PT and OT.  Diet is a no added salt diet.   DISCHARGE MEDICATIONS:  1. Cardizem CD 240 one daily.  2. Protonix 40 mg daily.  3. Synthroid 50 mcg daily.  4. Zoloft has been increased to 75 mg daily.  5. Coumadin as per pharmacy protocol.  6. Cipro 500 p.o. b.i.d. x7 additional days following readmission to      [facility].  7. Prednisone 10 mg daily.  8. Vicodin 5/500 one p.o. q.6 h p.r.n. pain.  9. Tylenol 650 p.o. q.4 h p.r.n. mild pain. She has been on prior to      this lisinopril HCT 20/12.5.  She has not needed this and this has      not been ordered. Imipramine has been discontinued and Valium has      been discontinued.  She should receive Ambien 5 mg p.o. q.h.s.      p.r.n. insomnia and aspirin 81 mg daily  as well indefinitely.           ______________________________  Geoffry Paradise, M.D.     RA/MEDQ  D:  08/30/2007  T:  08/30/2007  Job:  [id number]

## 2010-11-12 NOTE — H&P (Signed)
[name]               ACCOUNT NO.:  [id number]   MEDICAL RECORD NO.:  [id number]          PATIENT TYPE:  INP   LOCATION:  5739                         FACILITY:  [facility]   PHYSICIAN:  Mark A. Perini, M.D.   DATE OF BIRTH:  1930/01/21   DATE OF ADMISSION:  01/23/2007  DATE OF DISCHARGE:                              HISTORY & PHYSICAL   CHIEF COMPLAINT:  Nausea, vomiting.   HISTORY OF PRESENT ILLNESS:  [name] is a pleasant 77 year old female with  past history as listed below.  She developed a gastrointestinal virus  with vomiting and diarrhea of 4-5 days prior to admission.  She has had  chills, fever and some shortness of breath in the prior 24 hours before  admission.  She is weak and feels like she may faint when she stands up.  She kept thinking that she would improve.  She did have a bowl of cereal  yesterday and 1/2 bowel of cereal today, and that is all she has eaten.  Her last vomiting was 2 days prior to admission, and her last bowel  movement was 2 days prior to admission.  She will require admission.   PAST HISTORY:  1. Admitted in 2006 with vertigo.  2. Hypertension.  3. Hypothyroid.  4. Rheumatoid arthritis on chronic steroids.  5. Chronic back pain status post back fusion and compression      fractures.  6. Osteoporosis.  7. Anemia of chronic disease.  8. Depression.  9. Possible history of a nondescript arrhythmia.  10.Gastroesophageal reflux.  11.Status post bleeding ulcer in 2005.   ALLERGIES:  CODEINE, PLAVIX AND SULFA.   MEDICATIONS:  1. Aspirin 81 mg daily.  2. Cardizem CD 240 mg daily.  3. Folic acid 1 mg daily.  4. Lisinopril.  5. HCTZ 20/12.51 daily.  6. Nexium 40 mg daily.  7. Synthroid 50 mcg daily.  8. Valium 5 mg each evening.  9. Zoloft 50 mg daily.  10.Imipramine 50 mg q.h.s.  11.Prednisone 5 mg daily.  12.In the emergency room, she was given IV normal saline.   SOCIAL HISTORY:  She lives alone.  She has been a widow since  April  2008.  She has three children.  Her son Ramon Dredge is her power of attorney.  She has eight grandchildren.  No tobacco, no alcohol or drug use.   FAMILY HISTORY:  Noncontributory.   REVIEW OF SYSTEMS:  No chest pain.  She has had some right hip pain  lately.  There has been no blood above or below.   PHYSICAL EXAMINATION:  VITAL SIGNS:  Temperature 97, blood pressure  111/61, pulse 112 and then dropped to 97, respiratory 28, 98% saturation  on room air.  GENERAL:  She is in no acute distress.  HEENT:  Normocephalic, atraumatic.  She is slightly pale.  There is no  icterus, no JVD.  Oropharynx is dry.  LUNGS: Clear to auscultation bilaterally with no wheezes, rales or  rhonchi.  HEART:  Regular rate and rhythm with no murmur or gallop.  ABDOMEN:  Soft, nontender, nondistended.  There are normoactive bowel  sounds present.  There is no abdominal mass.  There is no edema.  She  moves extremities x4.   LABORATORY DATA:  Urinalysis is negative.  Chest x-ray shows no acute  findings.  White count of 11.4, 81% segs, 8% lymphocytes, 11% monocytes.  Hemoglobin is 10.9.  She states that it was 12 in the office not too  long ago, platelet count 185,000, PTT 29, INR 1.1.  GFR estimated at 30  ml per minute.  Sodium 138, potassium 3.8, chloride 106, CO2 25, BUN 24,  creatinine 1.6.  In April 2008, BUN was 31 and creatinine was 1.5,  glucose is now 125, calcium 8.5, AST 14 and total protein 6.1.  BNP is  less than 30.  Blood cultures x2 are pending.  Urine culture is pending.  EKG shows sinus tachycardia with possibly some old inferior T-waves but  no ST or T-wave changes.   ASSESSMENT/PLAN:  Probable adrenal crisis secondary to gastrointestinal  virus and the fact that she did not increase her prednisone dosing.  Will admit.  Will give IV fluids and some IV hydrocortisone.  Will place  on a full liquid diet.  Will give antibiotics if needed.  Will treat  with Lovenox for DVT prophylaxis  and continue her Nexium.  Will follow  her lab work.  Will follow her ins and outs carefully, and we will give  her bedside commode.  We will obtain a PT and OT evaluation and treat.  She is a full code status.           ______________________________  [name], M.D.     MAP/MEDQ  D:  01/24/2007  T:  01/25/2007  Job:  [id number]   cc:   Geoffry Paradise, M.D.  Areatha Keas, M.D.

## 2010-11-12 NOTE — Op Note (Signed)
NAMECHANDREA, [name] NO.:  [id number]   MEDICAL RECORD NO.:  [id number]          PATIENT TYPE:  INP   LOCATION:  5034                         FACILITY:  [facility]   PHYSICIAN:  Claude Manges. Whitfield, M.D.DATE OF BIRTH:  1929-07-08   DATE OF PROCEDURE:  08/19/2007  DATE OF DISCHARGE:                               OPERATIVE REPORT   PREOPERATIVE DIAGNOSIS:  End-stage rheumatoid arthritis, left knee.   POSTOPERATIVE DIAGNOSIS:  End-stage rheumatoid arthritis, left knee.   PROCEDURE:  Left total knee replacement with computer navigation.   SURGEON:  Claude Manges. Cleophas Dunker, M.D.   ASSISTANT:  Arlys John D. Petrarca, P.A.-C.   ANESTHESIA:  General with supplemental femoral nerve block.   COMPLICATIONS:  None.   COMPONENTS:  DePuy LCS standard plus femoral component, #4 rotating  keeled tibial tray with a 12.5 mm bridging bearing, a metal backed three  peg rotating patella.  All were secured with polymethyl methacrylate.   PROCEDURE:  With the patient comfortable on the operating table under  general orotracheal anesthesia, the nursing staff inserted a Foley  catheter.  Urine was clear.  The patient did receive preoperative  femoral nerve block for postoperative pain control.   The tourniquet was then applied to the left thigh.  The leg was then  prepped with Betadine scrub and then DuraPrep from the tourniquet to the  midfoot.  Sterile draping was performed.   With the extremity still elevated, it was Esmarch exsanguinated with a  proximal tourniquet at 250 mmHg.   A midline longitudinal incision was made centered from the superior  pouch crossing the patella to the tibial tubercle.  Via sharp dissection  incision carried down to subcutaneous tissue.  First layer of capsule  was incised in the midline and medial parapatellar incision was then  made with the Bovie.  The joint was entered.  There was a small clear  yellow joint effusion.  The patella was everted 180  degrees, the knee  flexed to 90 degrees.  There was approximately 10-12 degrees of valgus  preoperatively.  There were moderate osteophytes along the medial  lateral femoral condyle but complete absence of articular cartilage in  the femoral condyle laterally and tibial plateau laterally.  I was able  to correct the knee to neutral.   Computer navigation was employed.  The two Shantz pins were placed in  the proximal tibia and two in the distal femur.  The computer arrays  were then applied.  The normal anatomical axis was established, morphed  points were established on the femur and tibia.   Initial cut was then made on the proximal tibia, given the established  computer points with about an 8 degree posterior declination.  We had a  very nice cut that we confirmed with the computer.  We then established  flexion/extension gaps.  Subsequent cuts were made on the femur with  approximately 2 degrees of valgus.  MCL and LCL remained intact.  Lamina  spreaders were then inserted to remove PCL, ACL and any remnants of  medial and lateral menisci, osteophytes removed from the posterior  femoral condyles.   Based on the computer points,the final cuts and the femur were  performed.  Flexion/extension gaps were symmetrical at 12.5 mm.  Final  tapered cut was made on the femur.   Retractor was then placed about the tibia.  We measured a #4 tibial  tray.  This was applied, fixed in place with pins.  The center hole was  then made followed by the keeled cut.  With a #4 trial tibial tray in  place, a 12.5 mm bridging bearing was applied.  The trial standard plus  femoral component was then applied the knee was then reduced.  We had  approximately 2 degrees of valgus.  There was no opening with varus or  valgus stress.  We had very nice alignment of the components without  malrotation of the tibial tray.   The patella was then prepared by removing 12 mm of bone leaving 13 mm of  patella  thickness.  The trial patellar jig was then applied, three holes  made, the trial patella was applied.  Towel clips were then placed  across the capsule proximally and distally and through full range of  motion patella would not sublux.   Trial components were removed.  The joint was copiously irrigated with  jet saline.  The final components were then impacted with polymethyl  methacrylate.  We initially applied the tibia removing extraneous  methacrylate from its periphery followed by the 12.5 mm bridging bearing  and finally the standard plus femoral component.  Extraneous  methacrylate was removed from its periphery.   The patella was applied with methacrylate and patella clamped.   After complete maturation of methacrylate, the joint was inspected.  Any  hardened methacrylate was removed with a small osteotome.  Bone wax was  placed along the bleeding bone surfaces.  Marcaine with epinephrine was  injected in the deep capsule.  The wound was irrigated with saline  solution.  The tourniquet deflated and gross bleeders were Bovie  coagulated.  We had nice dry feel and a Hemovac was not necessary.   Joint was again irrigated with saline solution.  Deep capsule was closed  with interrupted #1 Ethibond.  I again trialed the components in full  range of motion.  We had full extension, very nice flexion, no  malrotation of the components or subluxation of patella and no opening  with a valgus stress medially.   The deep capsule was closed with 0-0 Vicryl, subcu with 2-0 Vicryl, skin  closed with skin clips.  The sterile bulky dressing was applied followed  by the patient's compressive stocking.   The patient tolerated without complications.      Claude Manges. Cleophas Dunker, M.D.  Electronically Signed     PWW/MEDQ  D:  08/19/2007  T:  08/20/2007  Job:  [id number]

## 2010-11-12 NOTE — Discharge Summary (Signed)
[name], [name] NO.:  [id number]   MEDICAL RECORD NO.:  [id number]          PATIENT TYPE:  INP   LOCATION:  5034                         FACILITY:  [facility]   PHYSICIAN:  Claude Manges. Whitfield, M.D.DATE OF BIRTH:  Mar 02, 1930   DATE OF ADMISSION:  08/19/2007  DATE OF DISCHARGE:  08/23/2007                               DISCHARGE SUMMARY   ADMISSION DIAGNOSIS:  Osteoarthritis of the left knee.   DISCHARGE DIAGNOSES:  1. Osteoarthritis of the left knee.  2. Status post right total knee arthroplasty.  3. History of cerebrovascular accident.  4. History of tachycardia.  5. History of hypothyroidism.  6. History of ulcers.  7. Rheumatoid arthritis.  8. Hypertension.  9. Acute postoperative anemia.  10.Hyponatremia.  11.Hypokalemia.   PROCEDURE:  Left total knee arthroplasty.   HISTORY:  A 77 year old white widowed female with a history of  rheumatoid arthritis, cared for by Areatha Keas, M.D., who now presents  with left knee pain.  Status post right total knee arthroplasty in 2001  with good results.  He has had progressive pain over the past several  months.  He has tried injections with minimal relief.  The pain is now  constant and severe.  Radiographic end-stage osteoarthritis of the left  knee.  Indicated now for left total knee arthroplasty.   HOSPITAL COURSE:  A 77 year old white female admitted on August 19, 2007, after appropriate laboratory studies were obtained as well as 1  gram of Ancef IV oncall to the operating room.  She was taken to the  operating room where she underwent a left total knee arthroplasty by Dr.  Cleophas Dunker and assisted by Oris Drone. Petrarca, P.A.-C.  She tolerated the  procedure well.  She was continued on Ancef 1 gram IV q.6 hours x3  doses.  PCA Dilaudid pump was used in a reduced manner for pain  management.  She was started on Lovenox 300 mg subcu q.12 hours on  February 20, at 8 a.m.  Coumadin was used also.  Foley was  placed  intraoperatively.  Consultation with PT, OT, and care management were  made.  She was allowed to be 50% weightbearing on the left.  CPM from 0  to 60 degrees for 6-8 hours per day was started, increasing the CPM by 5-  10 degrees per day until she reached a maximum of 110 degrees.  She was  allowed out of bed to a chair the following day.  She was then weaned  off of her PCA pump and off of her O2 keeping her saturations greater  than 92%.  Solu-Cortef was used to protect her secondary to her  prednisone and surgery.  Her dressing was changed on February 21, and  her wounds were benign.  Her Foley was discontinued on February 21 also.  A knee immobilizer was used for ambulation only.  Social services were  consulted and they checked on a bed for her at [facility].  Unfortunately she  was unable to have her Foley remain out and had to be catheterized and  finally the Foley was  replaced.  She did have some mental status changes  according to the family and on February 22, her Percocet was  discontinued and she was placed on Norco 5/325 one to two tablets every  4-6 hours p.r.n. pain.  A CT scan was ordered to rule out infarction.  This was noncontrast.  She did have hyponatremia and hypochloremia.  Osmolality was ordered on the blood.  Her hydrochlorothiazide was  discontinued.  Lovenox was also discontinued since her INR was near  therapeutic and prophylactic.  She was fluid restricted to 1200 mL for  24 hours.  On February 23, she was noted to have improvement in her  sodium as well as her potassium.  It was felt that she had improved  enough to be discharged to [facility] where she will continue with her  physical therapy until she is stable enough to return to home.   Radiographic studies revealed a chest x-ray of January 23, 2007, revealing  no active lung disease and a moderate size hiatal hernia.  CT scan in  comparison to September 14, 2004, there was a stable appearance to severe  small  vessel ischemic changes in the periventricular white matter as  well as old lacunas of bilateral thalami and bilateral basal ganglia.  Stable atrophy.  There is no evidence of visible acute infarction,  abnormal mass effect, hydrocephalus, or cerebral edema.  No extra-axial  fluid collections.  Bone windows are unremarkable.   EKG revealed sinus tachycardia, cannot rule out inferior infarct.  This  was from January 23, 2007.  On September 08, 2007, revealed normal sinus  rhythm, inferior infarct age undetermined.   LABORATORY DATA:  Admitted with a hemoglobin of 11.8, hematocrit 34.9%,  white count 9900, and platelets 251,000.  Discharge hemoglobin 9.1,  hematocrit 27.0%, white count 14,600, and platelets 205,000.  Electrolytes preoperative revealed sodium 137, potassium 4.2, chloride  101, CO2 27, glucose 147, BUN 29, creatinine 1.33.  GFR was 39.  Discharge labs reveal sodium 133, potassium 3.9, chloride 97, CO2 26,  glucose 151, BUN 17, and creatinine 1.03.  Preoperative pro-time was  13.2, INR 1.0, and PTT 25.  Discharge PT 27.3, INR 2.4.  Preoperative  calcium was 9.6.  Calcium at discharge was 8.3.  Urine culture revealed  no growth.  Serum osmolality of August 22, 2007, was 281.  Blood type  was O negative and antibody screen negative.  Urinalysis of February 19,  revealed small leukocyte esterase with 3-6 white blood cells, rare  squamous.   DISCHARGE INSTRUCTIONS:  There is no restriction in her diet.  She is to  increase her activity slowly.  She is to use a walker and she can be 50%  weightbearing as taught in PT.  She may shower with Saran Wrap about the  incision as soon as she desires.  No lifting or driving for six weeks.  A CPM is to be placed from 0-70 degrees for 6-8 hours per day,  increasing 5-10 degrees a day until she maximize to 110 degrees.  She  will follow the instruction sheet, keep the incision clean and dry.  Change her dressing daily.  TED hose to the operative  leg during the day  and may remove at nighttime for comfort bilaterally.   DISCHARGE MEDICATIONS:  1. Nexium 40 mg daily.  2. Lisinopril/hydrochlorothiazide 20/12.5 daily.  3. Diltiazem 240 mg daily.  4. Prednisone 5 mg daily.  5. Synthroid 50 mg daily.  6. Valium 5 mg q.h.s.  7. Zoloft 50 mg q.h.s.  8. Centrum Silver multivitamin one daily.  9. Caltrate 600 with D daily.  10.Imipramine 50 mg q.h.s.  11.Percocet 5/325 one to two tablets every 4 hours as needed for pain.  12.Robaxin 500 mg one tablet every 6-8 hours p.r.n. spasms.  13.Coumadin will need to be titrated by the pharmacist to keeping INR      between 2 and 3.   CONDITION ON DISCHARGE:  She was discharged in improved condition.      Oris Drone Petrarca, P.A.-C.      Claude Manges. Cleophas Dunker, M.D.  Electronically Signed    BDP/MEDQ  D:  08/23/2007  T:  08/23/2007  Job:  [id number]

## 2010-11-12 NOTE — H&P (Signed)
[name], [name] NO.:  [id number]   MEDICAL RECORD NO.:  [id number]          PATIENT TYPE:  INP   LOCATION:  1334                         FACILITY:  [facility]   PHYSICIAN:  Geoffry Paradise, M.D.  DATE OF BIRTH:  [birth year]   DATE OF ADMISSION:  08/27/2007  DATE OF DISCHARGE:                              HISTORY & PHYSICAL   CHIEF COMPLAINT:  Mental status changes and fever.   HISTORY OF PRESENT ILLNESS:  [name] is a very pleasant 77 year old  with essential hypertension, gastroesophageal reflux disease,  hypothyroidism, anemia of chronic disease and steroid-dependent  rheumatoid arthritis, presenting at this time with fever, mental status  changes and dehydration.  She evidently underwent a left total knee  replacement by Orthopedic Surgery and has been at [facility]  for rehab for approximately 6 days.  According to her son, [name], she  had an acute clinical decline going on 48-72 hours to include mental  status changes or back at least 2-3 nights ago.  As of today, she  presented to the emergency room tachycardic and febrile for further  evaluation.  Lab work in the nursing home did reveal clinical findings  compatible with a UTI; however, antibiotics had not been started as  these results had just now returned.  She denies any chest pain or  shortness of breath.  She denies any nausea or vomiting.  She relates  that she is having some left knee pain, but this as well contained.  She  has already received a dose of Rocephin and had packed red blood cells  ordered.  Also noteworthy, she does have a history of a mild anemia of  chronic disease and has had an extensive GI workup by Dr. Matthias Hughs in the  past.   PAST MEDICAL HISTORY:   ALLERGIES:  Intolerance to COUMADIN, PLAVIX and SULFA.   MEDICATIONS:  1. Aspirin 81 mg daily.  2. Cardizem CD 240 daily.  3. Folate 1 mg daily.  4. Lisinopril/hydrochlorothiazide 20/12.5 daily.  5. Nexium 40  mg daily.  6. Synthroid 50 mcg daily.  7. Valium 5 mg nightly.  8. Zoloft 50 mg daily.  9. Imipramine 50 mg daily.  10.Prednisone 5 mg daily.  11.Robaxin p.r.n.  12.Coumadin 4 mg daily.   MEDICAL ILLNESSES:  1. Hypertension.  2. Remote CVA.  3. Gastroesophageal reflux disease.  4. Hypothyroidism.  5. Rheumatoid arthritis.  6. Chronic anemia.   SURGICAL ILLNESSES:  Well outlined in the discharge summary, but recent  left total knee replacement, remote right total knee replacement,  multiple endoscopies and colonoscopies.   PHYSICAL EXAM:  VITAL SIGNS:  Temperature is 99, blood pressure is  139/54, pulse 120 and regular, respiratory rate is 18, O2 saturation 93%  on room air.  GENERAL:  The patient at this time is awake, alert, supine, in no  distress.  She is a bit confused regarding circumstances, but recognizes  me immediately and ask about my children.  She does have recollection of  surgery, but believes it is June.  HEENT:  Anicteric.  Pupils round and reactive.  Oropharynx:  Dry mucosa.  NECK:  No JVD or bruits.  LUNGS:  Clear bilaterally.  CARDIOVASCULAR:  Regular rate and rhythm, tachycardiac, 2/6 systolic  ejection murmur, no S3 or S4.  ABDOMEN: Good bowel sounds.  No hepatosplenomegaly.  Soft, nontender.  No rebound or guarding.  BACK:  No spinal tenderness.  Questionable left CVA tenderness.  Right  is clear.  EXTREMITIES:  No cyanosis, clubbing or edema.  Intact distal pulses.  No  mottling.  Stable intact left knee.  Right knee scar intact.  No  erythema or drainage for the knee.  NEUROLOGICAL:  She is awake, alert, knows she is in the hospital and  knows me, knows self, does not know date, circumstance or timing, moves  extremities x4.   ASSESSMENT:  1. Urinary tract infection.  2. Dehydration, acute.  3. Acute renal failure secondary to dehydration.  4. Anemia, postop blood loss superimposed on chronic disease, prior      gastrointestinal workup  negative.  5. Essential hypertension, stable.  6. Hypothyroidism, stable.  7. Steroid-dependent rheumatoid arthritis.  8. Questionable mild adrenal insufficiency.   PLAN:  The patient will be admitted for IV fluids, IV antibiotics,  stress steroids and transfusion.  For further see orders.           ______________________________  Geoffry Paradise, M.D.     RA/MEDQ  D:  08/27/2007  T:  08/29/2007  Job:  [id number]

## 2010-11-12 NOTE — Discharge Summary (Signed)
[name], [name] NO.:  [id number]   MEDICAL RECORD NO.:  [id number]          PATIENT TYPE:  INP   LOCATION:  5739                         FACILITY:  [facility]   PHYSICIAN:  Geoffry Paradise, M.D.  DATE OF BIRTH:  [birth year]   DATE OF ADMISSION:  01/23/2007  DATE OF DISCHARGE:                               DISCHARGE SUMMARY   DIAGNOSES AT TIME OF DISCHARGE:  1. Nausea and vomiting secondary to adrenal insufficiency/crisis.  2. Anemia of chronic disease.  3. Gastroenteritis precipitating adrenal crisis.  4. Steroid-dependent rheumatoid arthritis.  5. Chronic pain syndrome secondary to rheumatoid arthritis and      compression fractures.  6. Hypothyroidism.  7. Depression.   HISTORY OF PRESENT ILLNESS:  [name] is a 77 year old with  longstanding multiple illnesses including steroid-dependent rheumatoid  arthritis, chronic back pain with compression fractures, hypertension,  hypothyroidism, recently lost her husband, presenting at this time with  several days of nausea, vomiting and diarrhea.  She additionally,  subsequent to development of these symptoms, has developed  lightheadedness, weakness, poor p.o. intake, and subjective dyspnea.  In  the emergency room, she was intolerant of p.o. intake, felt to be in the  midst of an adrenal crisis precipitated by her GI viral illness,  admitted for IV steroids and IV fluids.  For further details, see the  dictated summary by my partner on January 23, 2007.   DATA:  EKG:  Sinus tachycardia, no acute changes.  Chest x-ray:  No  active disease, moderate hiatal hernia.  Urine culture:  No growth.  Blood cultures:  No growth.  Urinalysis was negative.  TSH normal at  0.421.  BNP less than 30.  Liver function testing:  Protein 5.1, albumin  2.1, SGOT 15, SGPT 10, alk phos 60, bilirubin 0.6.  Calciums were 8.5,  8.2, and 8.6 respectively.  Chemistry:  Sodium 138, potassium 3.8,  chloride 106, CO2 25, glucose 125, BUN 24,  creatinine 1.6.  Repeat  creatinine was 1.1.  CBC:  Hemoglobin 10.9; hematocrit 32.6; white blood  cell count 11.4; platelet count 185,000.  Magnesium was 2.2.   HOSPITAL COURSE:  The patient was admitted, treated with antiemetics,  aggressively hydrated, potassium and magnesium replaced.  Steroids were  provided via hydrocortisone 100 b.i.d. with continuation of home  medications.  She responded well, p.o. intake restabilized, steroids  were converted to oral form.  She has been maintained on a little bit of  higher dose at this point, working towards 10 mg daily which is her  maintenance dose.  She did have some mild glucose intolerance which was  treated with sliding scale insulin, but remains weak, deconditioned, and  daughter now expresses concerns about return home.  The viral illness  combined with the adrenal insufficiency superimposed upon her baseline  marginal status secondary to rheumatoid arthritis and chronic back pain  have deconditioned her to the extent that physical therapy has become  involved and daughter now raises concerns of safety at home.  I did have  an extensive discussion with the patient with this regards, and she will  talk to daughter today about return home today versus skilled nursing.  At this point she is tolerating orals well with no further nausea,  vomiting, or diarrhea, and is stable from a cardiopulmonary standpoint.   DISCHARGE DIET:  Regular diet.   MEDICATIONS:  1. Aspirin 81 mg daily.  2. Cardizem CD 240 daily.  3. Folate 1 mg daily.  4. Lisinopril/hydrochlorothiazide 20/12.5 daily.  5. Nexium 40 mg daily.  6. Synthroid 50 mcg daily.  7. Valium 5 mg p.o. q.h.s.  8. Zoloft 50 mg p.o. daily.  9. Prednisone will be discharged at 10 mg daily indefinitely.   She will need some OT and PT either through skilled nursing or home  health.  She is also on imipramine 50 mg p.o. q.h.s. with regards to  what she has been on this for some time.  Her  anemia of chronic disease  is currently at her baseline.  She has had an extensive GI workup by Dr.  Madilyn Fireman in the past.  There has been no need for transfusion and she has  not dropped to the extent that she needs any Aranesp.  At some point as  an outpatient we will reevaluate her bone disease, as she has been  intolerant to bisphosphonates in the past.           ______________________________  Geoffry Paradise, M.D.     RA/MEDQ  D:  01/26/2007  T:  01/26/2007  Job:  [id number]

## 2010-11-13 ENCOUNTER — Other Ambulatory Visit (INDEPENDENT_AMBULATORY_CARE_PROVIDER_SITE_OTHER): Payer: Medicare Other | Admitting: *Deleted

## 2010-11-13 ENCOUNTER — Ambulatory Visit (INDEPENDENT_AMBULATORY_CARE_PROVIDER_SITE_OTHER): Payer: Medicare Other | Admitting: Cardiology

## 2010-11-13 ENCOUNTER — Encounter: Payer: Self-pay | Admitting: Cardiology

## 2010-11-13 DIAGNOSIS — I219 Acute myocardial infarction, unspecified: Secondary | ICD-10-CM

## 2010-11-13 DIAGNOSIS — Z13220 Encounter for screening for lipoid disorders: Secondary | ICD-10-CM

## 2010-11-13 DIAGNOSIS — E7800 Pure hypercholesterolemia, unspecified: Secondary | ICD-10-CM

## 2010-11-13 DIAGNOSIS — D539 Nutritional anemia, unspecified: Secondary | ICD-10-CM

## 2010-11-13 LAB — CBC WITH DIFFERENTIAL/PLATELET
Basophils Relative: 0.4 % (ref 0.0–3.0)
Eosinophils Relative: 4.1 % (ref 0.0–5.0)
HCT: 32.2 % — ABNORMAL LOW (ref 36.0–46.0)
Lymphs Abs: 3.1 10*3/uL (ref 0.7–4.0)
MCV: 90.4 fl (ref 78.0–100.0)
Monocytes Absolute: 0.9 10*3/uL (ref 0.1–1.0)
Platelets: 202 10*3/uL (ref 150.0–400.0)
WBC: 11 10*3/uL — ABNORMAL HIGH (ref 4.5–10.5)

## 2010-11-13 LAB — BASIC METABOLIC PANEL
BUN: 31 mg/dL — ABNORMAL HIGH (ref 6–23)
CO2: 29 mEq/L (ref 19–32)
Chloride: 106 mEq/L (ref 96–112)
Creatinine, Ser: 1.4 mg/dL — ABNORMAL HIGH (ref 0.4–1.2)
Glucose, Bld: 67 mg/dL — ABNORMAL LOW (ref 70–99)

## 2010-11-13 LAB — HEPATIC FUNCTION PANEL
ALT: 18 U/L (ref 0–35)
Bilirubin, Direct: 0.1 mg/dL (ref 0.0–0.3)
Total Protein: 6.2 g/dL (ref 6.0–8.3)

## 2010-11-13 LAB — LIPID PANEL: Triglycerides: 205 mg/dL — ABNORMAL HIGH (ref 0.0–149.0)

## 2010-11-13 LAB — LDL CHOLESTEROL, DIRECT: Direct LDL: 60.7 mg/dL

## 2010-11-13 NOTE — Assessment & Plan Note (Signed)
She had cardiac catheterization in January 2012 and was felt to have ruptured plaques in the proximal and mid left anterior descending with high-grade distal LAD disease. He was felt that she could be managed medically. Overall, she's done reasonably well. I'll have her continue to gradually increase her activity. We'll have followup with Dr. Erskine Emery Smithin 2 months.

## 2010-11-13 NOTE — Progress Notes (Signed)
Subjective:   [name] is seen today for followup of her non-ST segment elevation MI in January 2012. In general, she's been doing well with no recurrent angina but only chronic shortness of breath and dyspnea on exertion. She's had a poor appetite and low back pain but otherwise is doing well.  She has a history of spinal fusion surgery, knee surgery, gastroesophageal reflux, hilar hernia, hypothyroidism, osteoporosis, depression and anxiety. She is off of Brilinta. She still mourns her husband's death 4 years ago.  Current Outpatient Prescriptions  Medication Sig Dispense Refill  . acetaminophen (TYLENOL) 500 MG tablet Take 500 mg by mouth every 6 (six) hours as needed.        . Ascorbic Acid (VITAMIN C PO) Take by mouth daily.        Marland Kitchen aspirin 81 MG tablet Take 81 mg by mouth daily.        . Calcium Carbonate (CALTRATE 600 PO) Take by mouth daily.        . Cholecalciferol (VITAMIN D PO) Take by mouth daily.        . diazepam (VALIUM) 5 MG tablet Take 5 mg by mouth at bedtime.        Marland Kitchen esomeprazole (NEXIUM) 40 MG capsule Take 40 mg by mouth daily before breakfast.        . fish oil-omega-3 fatty acids 1000 MG capsule Take 1 g by mouth daily.        . folic acid (FOLVITE) 1 MG tablet Take 1 mg by mouth daily.        Marland Kitchen HYDROcodone-acetaminophen (VICODIN) 5-500 MG per tablet 1 tablet Daily.      Marland Kitchen imipramine (TOFRANIL) 50 MG tablet Take 1 tablet by mouth Daily.      Marland Kitchen levothyroxine (SYNTHROID, LEVOTHROID) 50 MCG tablet Take 50 mcg by mouth daily.        Marland Kitchen lisinopril-hydrochlorothiazide (PRINZIDE,ZESTORETIC) 20-12.5 MG per tablet Take 1 tablet by mouth daily.        . metoprolol (LOPRESSOR) 50 MG tablet Take 50 mg by mouth 2 (two) times daily.        . Multiple Vitamin (MULTIVITAMIN) tablet Take 1 tablet by mouth daily.        . predniSONE (DELTASONE) 5 MG tablet Take 5 mg by mouth daily.        . rosuvastatin (CRESTOR) 20 MG tablet Take 20 mg by mouth daily.        . sertraline (ZOLOFT) 50  MG tablet Take 50 mg by mouth daily.          Allergies  Allergen Reactions  . Codeine   . Percocet (Oxycodone-Acetaminophen)   . Plavix (Clopidogrel Bisulfate)   . Sulfur     Patient Active Problem List  Diagnoses  . SOB (shortness of breath)  . MI (myocardial infarction)  . Poor appetite  . Arthritis  . Fatigue  . Right ear pain  . Renal insufficiency  . RA (rheumatoid arthritis)  . PUD (peptic ulcer disease)  . GI bleed  . GERD (gastroesophageal reflux disease)  . Hiatal hernia  . Hypothyroidism  . Osteoporosis  . Depression  . Anxiety    History  Smoking status  . Never Smoker   Smokeless tobacco  . Never Used    History  Alcohol Use No    No family history on file.  Review of Systems:   The patient denies any heat or cold intolerance.  No weight gain or weight loss.  The  patient denies headaches or blurry vision.  There is no cough or sputum production.  The patient denies dizziness.  There is no hematuria or hematochezia.  The patient denies any muscle aches or arthritis.  The patient denies any rash.  The patient denies frequent falling or instability.  There is no history of depression or anxiety.  All other systems were reviewed and are negative.   Physical Exam:   Blood pressure is 150/70. Heart rate 64. Weight is 145.The head is normocephalic and atraumatic.  Pupils are equally round and reactive to light.  Sclerae nonicteric.  Conjunctiva is clear.  Oropharynx is unremarkable.  There's adequate oral airway.  Neck is supple there are no masses.  Thyroid is not enlarged.  There is no lymphadenopathy.  Lungs are clear.  Chest is symmetric.  Heart shows a regular rate and rhythm.  S1 and S2 are normal.  There is no murmur click or gallop.  Abdomen is soft normal bowel sounds.  There is no organomegaly.  Genital and rectal deferred.  Extremities are without edema.  Peripheral pulses are adequate.  Neurologically intact.  Full range of motion.  The patient is  not depressed.  Skin is warm and dry.  Assessment / Plan:

## 2010-11-14 ENCOUNTER — Telehealth: Payer: Self-pay | Admitting: Cardiology

## 2010-11-14 DIAGNOSIS — E785 Hyperlipidemia, unspecified: Secondary | ICD-10-CM

## 2010-11-14 DIAGNOSIS — Z79899 Other long term (current) drug therapy: Secondary | ICD-10-CM

## 2010-11-14 NOTE — Telephone Encounter (Signed)
Left message for pt.  Lab results back. Left call back number. Pt to follow up with Dr Katrinka Blazing in 6 months records to be sent.

## 2010-11-14 NOTE — Telephone Encounter (Signed)
Returned phone call. Labs reported. Verbalizes understanding of need for recheck in 6 months

## 2010-11-15 NOTE — H&P (Signed)
NAME:  [name], [name]                         ACCOUNT NO.:  [id number]   MEDICAL RECORD NO.:  [id number]                   PATIENT TYPE:  INP   LOCATION:  5508                                 FACILITY:  [facility]   PHYSICIAN:  Mark A. Perini, M.D.                DATE OF BIRTH:  05/18/1930   DATE OF ADMISSION:  09/07/2003  DATE OF DISCHARGE:                                HISTORY & PHYSICAL   CHIEF COMPLAINT:  Shortness of breath and anemia.   HISTORY OF PRESENT ILLNESS:  [name] is a pleasant 77 year old female  with a longstanding history of rheumatoid arthritis, hypertension,  hypothyroidism, and a history of anemia of no definite cause.  She was to  have a right ankle surgery for a fallen right ankle; however, this was  cancelled this week due to a hemoglobin of 6.8.  This was rechecked and  found to be 7.6 just prior to surgery.  Apparently a couple of days passed  and then the patient presented to the office.  A recheck hemoglobin today  was 6.8 with a low MCV.  She denies any melena, hematochezia, or abdominal  pains.  But, she has had eight weeks of progressive weakness, fatigue, and  feeling cold.  She will require admission for further evaluation and care.   PAST MEDICAL HISTORY:  1. Rheumatoid arthritis since age 15.  2. Total abdominal hysterectomy in 1980.  3. Rectocele repair in 1980.  4. Remote history of complicated back fusion, which did require 13 units of     blood after the operation.  This was done at [facility]  5. History of three breast biopsies, which were all apparently benign.  6. History of anemia in 2002.  She underwent colonoscopy and barium enema.     She was found to have diverticulosis at that time, but there was no clear     cause of her anemia.  She was managed expectantly.  7. Hypothyroidism.  8. Hypertension.  9. History of right total knee arthroplasty.  10.      History of CVA in approximately 2001, which was confirmed by MRI.     She had some  speech difficulties, but has had no residual problems since     that time.  11.      Osteoarthritis.  12.      History of pelvic fracture in June 2004.  13.      Osteoporosis.   ALLERGIES:  SULFA, CODEINE, PLAVIX, CORTISONE.   MEDICATIONS:  1. Valium 5 mg each evening.  2. Prednisone 5 mg q.a.m.  3. Cardizem CD 240 mg q.d.  4. Synthroid 50 mcg q.d.  5. Aspirin 81 mg q.d.  6. Tofranil 3 mg each evening.  7. Caltrate two tablets daily.  8. Hydrocodone rarely.  9. Centrum multivitamin q.d.  10.      Nexium 40 mg q.d.  11.  Voltaren-XR 100 mg q.d.  12.      Osteo Bioflex daily.  13.      Lisinopril-HCT 20/12.5 mg one q.d.  14.      Miacalcin nose spray.  She was to start Forte soon, but has not done so yet.  She uses rare  Miralax.  She uses an occasional stool softener.  She does have  __________ iron supplement at home, but has not really been taking this (she  did take one yesterday).   SOCIAL HISTORY:  She is married, her husband's name is Greggory Stallion.  She has been  married since 1948; he is a dialysis patient and requires 24-hr supervision  and care.  No tobacco, no alcohol and no drug use history.  She has three  grown children and eight grandchildren.  She retired from [employer] in 1993  after 25 years of service.   FAMILY HISTORY:  Father died at age 46 of lung cancer.  Mother is alive at  age 40.  She has a living brother at age 39 and a living sister at age 79,  who are in generally good health.   REVIEW OF SYMPTOMS:  The patient feels tired and fatigued, cold sometimes.  No definite fever.  She has had somewhat decreased appetite lately.  She  denies any dysphagia.  No chest pains.  Some shortness of breath and dyspnea  on exertion.  She has had no change in her mild right lower extremity edema.  She denies any significant abdominal pains. Her bowel movements have been  normal for her, although she is occasionally constipated.  She denies any  genitourinary symptoms.   Denies any blood from above or below.  She has  never had a cardiac stress test.   PHYSICAL EXAMINATION:  VITAL SIGNS:  Temperature 97.5, pulse 117,  respiratory rate 22, blood pressure 143/71, oxygen saturation 98% on room  air, weight 68.9 kg.  GENERAL:  She is in no acute distress.  HEENT:  Normocephalic and atraumatic.  There is pallor of the conjunctivae.  Oropharynx does reveal some posterior erythema, and she states she has had a  sore throat as of today.  NECK:  There is no JVD.  No carotid bruits.  Neck is supple.  LUNGS:  Clear to auscultation bilaterally, with no wheezes, rales or  rhonchi.  HEART:  Tachycardic with no significant murmur.  ABDOMEN:  Soft and nontender, with no masses. There are normoactive bowel  sounds.  EXTREMITIES:  There is no peripheral edema.  NEUROLOGIC:  Grossly intact.  RECTAL:  Reveals some firm stool in the vault.  Stool is brown.  Heme  testing is negative, or only very slightly trace-positive.   LABORATORY DATA:  White count 14.9, with 12,000 segs, 2,000 lymphocytes and  600 monocytes.  Her last hemoglobin was 10.9 back in August 2004.  Hemoglobin now at 6.8 with an MCV of 69.8.  Last hemoglobin in our office  was 10.5 in August 2004, with an MCV of 81.8.  Platelet count 413,000.  On  August 31, 2003 -- Sodium 134, potassium 4.5, chloride 103, CO2 23, BUN 32,  creatinine 1.5 (which is stable).  Her last BUN and creatinine were 30 and  1.5 in August 2004.  Glucose 130, calcium 8.6.   EKG:  Sinus tachycardia, left atrial enlargement.  No significant ST or T-  wave changes.   CHEST X-RAY:  (August 31, 2003)  Showed no acute disease.  She does have  Harrington rods  in the thoracolumbar spine, and a hiatal hernia is  incidentally noted.   ASSESSMENT AND PLAN:  A 77 year old female with profound iron deficiency  anemia.  We will admit, start an IV and check laboratory data.  We will type and cross and transfuse two units of packed red blood cells.   Will obtain a  gastroenterology consult as well.  I suspect she has some form of gastritis,  given the fact that she is on prednisone, aspirin and Voltaren.  However, we  will continue her current medications for now.  The patient is a FULL CODE  STATUS.                                                Mark A. Waynard Edwards, M.D.    MAP/MEDQ  D:  09/07/2003  T:  09/08/2003  Job:  [id number]   cc:   Geoffry Paradise, M.D.  [address]  [place]  [place] [address]  Fax: [phone]   Everardo All. Madilyn Fireman, M.D.  [address]  [place]  [place] [address]  Fax: [phone]   Areatha Keas, M.D.  [address]  [address]  [place]  [place] [address]  Fax: [phone]   Leonides Grills, M.D.  [address]  [place]  [place] [address]  Fax: [phone]   Stefani Dama, M.D.  [address]  [place]  [place] [address]  Fax: [phone]

## 2010-11-15 NOTE — Op Note (Signed)
NAMECONNELLY, SPRUELL               ACCOUNT NO.:  [id number]   MEDICAL RECORD NO.:  [id number]          PATIENT TYPE:  AMB   LOCATION:  ENDO                         [facility]ACILITY[facility]  [facility]   PHYSICIAN:  John C. Madilyn Fireman, M.D.    DATE OF BIRTH:  08/31/1929   DATE OF PROCEDURE:  07/23/2004  DATE OF DISCHARGE:                                 OPERATIVE REPORT   PROCEDURE:  Colonoscopy.   ENDOSCOPIST:  Everardo All. Madilyn Fireman, M.D.   INDICATIONS FOR PROCEDURE:  Rectal bleeding and anemia.   DESCRIPTION OF PROCEDURE:  The patient was placed in the left lateral  decubitus position, and placed on the pulse monitor with continuous low-flow  oxygen delivered by nasal cannula.  She was sedated with 50 mcg of IV  fentanyl and 4 mg of IV Versed, in addition to the medicine given for the  previous EGD.  The Olympus video colonoscope was inserted into the rectum  and advanced to the cecum.  Confirmed by transillumination of McBurney's  point and visualization of the ileocecal valve and the appendiceal orifice.  The prep was good.  The cecum, ascending, transverse, descending and sigmoid  colon all appeared normal, with no masses, polyps, diverticula, or other  mucosal abnormalities.  The rectum likewise appeared normal.  A retroflexed  view of the anus revealed some small internal hemorrhoids.  The scope was  then withdrawn, and the patient returned to the recovery room in stable  condition.  She tolerated the procedure well, and there were no immediate  complications.   IMPRESSION:  Small internal hemorrhoids.   PLAN:  Will await the CLO test on the EGD done prior to this procedure, and  treat hemorrhoids symptomatically.      JCH/MEDQ  D:  07/23/2004  T:  07/23/2004  Job:  [id number]   cc:   Geoffry Paradise, M.D.  [address]  [place]  [place] [address]  Fax: [phone]

## 2010-11-15 NOTE — Op Note (Signed)
[facility]  Patient:    [name], [name]                    MRN: [id number] Proc. Date: 10/08/00 Adm. Date:  [id number] Attending:  Randolm Idol                           Operative Report  PREOPERATIVE DIAGNOSIS:  End-stage rheumatoid arthritis, right knee.  POSTOPERATIVE DIAGNOSIS:  End-stage rheumatoid arthritis, right knee  OPERATION:  Right total knee replacement.  SURGEON:  Claude Manges. Cleophas Dunker, M.D.  ASSISTANT:  1. Jerolyn Shin. Tresa Res, M.D.             2. Jamelle Rushing, P.A.-C.  ANESTHESIA:  General orotracheal anesthesia  COMPLICATIONS:  None.  COMPONENTS:  Depuy LCS complete standard plus femoral component, No. 3 tibial tray with a 17.5 mm insert and a standard plus cruciate design metal-backed patella component.  All were secured with polymethyl methacrylate.  DESCRIPTION OF PROCEDURE:  With the patient comfortable on the operating table and under general orotracheal anesthesia, the right lower extremity was placed in thigh tourniquet.  The left leg was then prepped with Betadine scrub and then DuraPrep from the tourniquet to the ankle.  Sterile draping was performed.  With the extremity still elevated, it was Esmarch exsanguinated with the proximal tourniquet inflated at 350 mmHg.  A midline longitudinal incision was made centered over the patella, extending from the superior pouch to the tibial tubercle.  By sharp dissection, the incision was carried down to subcutaneous tissue and through  the first layer of capsule.  A medial peripatella incision was then made through the deep capsule.  There was a clear yellow joint effusion of approximately 20 cc.  The patella was everted 180 degrees.  The knee was then flexed at 90 degrees. There was a significant valgus deformity of the knee with laxity medially. There was a depression in the lateral tibial plateau and complete absence of articular cartilage along the entire lateral  femoral condyle.  There was moderate rheumatoid synovitis, and this was resected.  Preoperatively, we had measured either a standard plus or a large femoral and tibial component.  We measured standard plus components intraoperatively.  The appropriate tibial and femoral jigs were then applied to the tibia and femur to make the appropriate cuts.  We initially tried a 12.5 and then a 15 mm flexion and extension gap.  We felt they were lax medially.  Accordingly, a 17.5 mm flexion and extension gap appeared to be symmetrical.  I did do a lateral release, releasing the capsule from the posterior tibia and lateral tibia and anterior tibia laterally and also removed osteophytes from the posterior aspect of the femur.  I used a 4 degree distal valgus femoral cut. PCL and ACL were sacrificed.  MCL and LCL remained intact.  A retractor was placed above the tibia.  Using the Bovie, I removed remnants of mediolateral menisci and any remaining osteophytes.  We measured a standard plus tibial component, and the appropriate jig was applied to make a center hole.  The trial components were then applied, and we felt that a 17.5 mm tibial bearing gave [place] excellent stability medially and laterally.  It was a little tight in extension but felt that placing the heel on the table that it would stretch.  The patella was then prepared by removing 10 mm of bone and removing approximately 12.5 mm  of patella.  The cruciate guide was applied to make the appropriate cut.  The patella was then applied.  The knee was placed through a full range of motion with the trial components, and there was no lateral subluxation. Patella clips were applied to the capsule, and the knee was placed through a full range of motion, and there was no malrotation of the tibia nor any opening in the varus or valgus stress.  We had perhaps 3 to 4 degrees lacking full extension.  All the trial components were removed.  The joint was  copiously irrigated with saline and antibiotic solution.  Each of the final components were then secured with polymethyl methacrylate.  I used the standard plus tibial component, the standard plus femoral component, and the patella with a patella clamp.  A 17.5 mm bridging bearing was utilized.  The knee was maintained in extension while the methacrylate matured.  Extraneous methacrylate was removed initially with a Glorious Peach and then with an osteotome after it hardened.  The joint was again irrigated with saline and antibiotic solution.  The knee was again placed through a full range of motion.  I thought we had excellent range of motion, again lacking just a few degrees to full extension, but I did not think that was going to be a problem because she had significant limitation preoperatively with a very lax knee laterally.  Tourniquet was deflated.  Gross bleeders were Bovie coagulated.  There was minimal bleeding, but I did insert a Hemovac.  The deep capsule was closed with interrupted #1 Tycron.  The superficial capsule was closed with 2-0 Vicryl.  The subcutaneous tissue was closed with 2-0 Vicryl, skin closed with skin clips.  Sterile bulky dressing was applied followed by an Ace bandage and a knee immobilizer.  The patient tolerated the procedure without complications. DD:  10/08/00 TD:  10/08/00 Job: [id number]

## 2010-11-15 NOTE — Discharge Summary (Signed)
[facility]  Patient:    [name], [name]                    MRN: [id number] Adm. Date:  [id number] Disc. Date: [id number] Attending:  Herold Harms Dictator:   Arnoldo Morale, P.A. CC:         Richard A. Jacky Kindle, M.D.   Discharge Summary  ADMISSION DIAGNOSES: 1. End-stage rheumatoid arthritis, right knee, with valgus deformity. 2. Hypertension. 3. Hypothyroidism. 4. Hiatal hernia. 5. History of stroke.  DISCHARGE DIAGNOSES: 1. End-stage rheumatoid arthritis, right knee, with valgus deformity. 2. Hypertension. 3. Hypothyroidism. 4. Hiatal hernia. 5. History of stroke. 6. Posthemorrhagic anemia.  SURGICAL PROCEDURE:  On October 08, 2000, she underwent a right total knee arthroplasty by Dr. Claude Manges. Whitfield assisted by Dr. Vear Clock and Arlyn Leak, P.A.-C.  COMPLICATIONS:  None.  CONSULTS: 1. Rehab medicine consult, October 09, 2000. 2. Internal medicine consult by Dr. Jacky Kindle on October 09, 2000. 3. Pharmacy consult for Coumadin therapy, October 08, 2000. 4. Physical therapy consult on October 09, 2000. 5. Occupational therapy consult, October 10, 2000.  HISTORY OF PRESENT ILLNESS:  This 77 year old female presented to Dr. Cleophas Dunker with a history of rheumatoid arthritis since age 89.  Her right knee pain developed about 1979 and she was treated with a cortisone shot with marked relief into the last two to three years.  At this time, the pain has returned and she is starting to develop mechanical symptoms such as puffing, cracking, and swelling in her knee. These symptoms are worse with ambulation and decreased with rest.  The pain does keep her awake at night and irradiates into her calf.  She is currently ambulatimg without any assistive devices. She has failed conservative treatment at this time and, because of this, she is presenting for a right total knee arthroplasty.  HOSPITAL COURSE:  [name] tolerated her surgical procedure well  without immediate postoperative complications.  She was subsequently transferred to [facility] [place].  Dr. Jacky Kindle was consulted for medical management and he followed her throughout her hospitalization.  She did well on the first night postoperatively.  On postoperative day #1, she was afebrile, vital signs stable.  Right knee incision was well approximated with staples with moderate drainage.  Her Hemovac was discontinued.  Hemoglobin was 8.8 with hematocrit of 25.4, and she was subsequently transfused with two units of packed red blood cells.  She was started on therapy per protocol.  On postoperative day #2, pain was well controlled with current medications. Hemoglobin had improved to 11.5 with hematocrit of 33.5.  Her wound was unchanged.  She was continued on therapy per protocol.  She continued to make good progress with therapy over the next several days.  There were no immediate complications.  Finally, on postoperative day #4, she remained afebrile, vital signs stable.  Pain was well controlled with current medications.  She did have significant weakness in her quads and hamstrings due to her chronic rheumatoid arthritis and it was believed she was a good candidate for rehab.  She was able to be transferred to the rehab unit at that time.  Her PT at that point was 21.1 with an INR of 2.4.  DISCHARGE INSTRUCTIONS: 1. She is to continue her current hospitalization medications and diet with    adjustments to be made per the rehab medicine doctors. 2. She is to be partial weightbearing 50% or less on the right leg with the  use of the walker and she is to continue physical therapy and occupational    therapy per rehab protocols. 3. She needs to have her staples removed at about postoperative day #14 and,    if she is still hospitalized, it can be done there.  Otherwise, she needs    to follow up with Dr. Cleophas Dunker in our office at that time.  If staples are    removed while she is  hospitalized, then she needs follow-up with with    Dr. Cleophas Dunker in about two to three weeks after her discharge.  Please call    [phone] to set up that appointment. 4. Please notify Dr. Cleophas Dunker of temperature greater than 101.5, chills, pain    unrelieved by pain medications, or foul, smelly drainage from the wounds.  LABORATORY DATA:  On April 9, hemoglobin 8.9, hematocrit 26.4.  On April 11, hematocrit 28.  On April 12, white count 14.8, hemoglobin 8.8, hematocrit 25.4.  On April 13, white count 16.5, hemoglobin 11.4, hematocrit 33.5.  On April 14, white count 17, hemoglobin 10.8, hematocrit 32, and platelets 203.  On October 06, 2000, her PT was 14.1 with an INR of 1.2 and a PTT of 29.  On April 13, PT 17.2, INR 1.7.  On April 14, PT 23.8, INR 3.  On April 15, PT 21.1, INR 2.4.  On April 9, glucose 148, BUN 24, creatinine 1.5.  On April 12, sodium 135, potassium 4.4, chloride 104, CO2 25, glucose 135, BUN 16, creatinine 1.1, and calcium 7.8.  HIV test was nonreactive.  Urinalysis on October 06, 2000, showed cloudy yellow urine and repeat on April 14 showed amber, clear urine with protein of 30 mg per dl, and rare epithelials and granular casts.  All other laboratory studies were within normal limits. DD:  10/23/00 TD:  10/25/00 Job: [id number]

## 2010-11-15 NOTE — H&P (Signed)
[name], [name]               ACCOUNT NO.:  [id number]   MEDICAL RECORD NO.:  [id number]          PATIENT TYPE:  INP   LOCATION:  6703                         FACILITY:  [facility]   PHYSICIAN:  Larina Earthly, M.D.        DATE OF BIRTH:  [birth year]   DATE OF ADMISSION:  09/14/2004  DATE OF DISCHARGE:                                HISTORY & PHYSICAL   EMERGENCY ROOM HISTORY AND PHYSICAL   CHIEF COMPLAINT:  Fall with scalp contusion associated with nausea,  vomiting, vertigo, and back pain.   HISTORY OF PRESENT ILLNESS:  This is a 77 year old Caucasian female, who has  multiple medical problems well-outlined below, but includes rheumatoid  arthritis, degenerative joint disease status post multiple surgeries, and on  chronic steroid use with a history of osteoporosis and anemia, who now  presents to the emergency room after falling back from a squatting position  of approximately three feet with no loss of consciousness.  She suffered a  contusion on the occiput of the head followed by significant bleeding.  She  presented to the emergency room where she also complained of back pain,  dizziness with sitting up associated with nausea and vomiting.  A head CT  was unremarkable for any internal cranial bleeding, but positive for the  scalp contusion.  An ISTAT-8 was unremarkable, however, despite giving the  patient Antivert and Valium, the patient continued to complain of dizziness,  nausea, and vomiting.  Patient was also given Zofran.  On my arrival, the  patient also complained of back pain, which is worse than her normal.  She  also has a history of a significant back surgery at [facility] and is concerned about a fracture in her back given her history of  osteoporosis.  In the emergency room, the patient remained alert and  oriented x3, however, was unable to sit up without significant dizziness  that was felt to be secondary to vertigo as well as general malaise  and  weakness.  Given that the patient lives at home with her disabled husband,  who has end-stage renal disease and dialysis and has dementia and the family  is unable to care for this couple 24/7 and the fact that the patient is the  primary caregiver for the husband, it was deemed appropriate for further  admission and evaluation of her multiple complaints.   REVIEW OF SYSTEMS:  Negative for fevers, chills, chest pain, visual changes,  focal neurological deficit, but positive for vertigo, nausea, vomiting,  worsening back pain, controlled gastroesophageal reflux disease, and also  positive for chronic shortness of breath, which is relatively unchanged.   PROBLEM LIST:  1.  Rheumatoid arthritis since the age of 35.  2.  History of total abdominal hysterectomy in 1980.  3.  Rectocele repair in 1980.  4.  Remote history of complicated back fusion, which required 13 units of      blood after operation done at [facility].  5.  History of three breast biopsies, all benign.  6.  History of anemia with  a workup by Dr. Madilyn Fireman and Dr. Ewing Schlein, with the      findings of internal hemorrhoids, diverticulosis, gastritis, and      gastroesophageal reflux disease.  7.  Hypothyroidism  8.  Hypertension.  9.  History of right total knee replacement.  10. History of right ankle repair by Dr. Lestine Box.  11. History of a cerebrovascular accident in 2001 confirmed by MRI, with no      residual problems since that time.  12. Osteoarthritis.  13. Rheumatoid arthritis followed by Dr. Phylliss Bob.  14. History of pelvic fracture in June 2004.  15. Osteoporosis.  16. History of peptic ulcer disease.  17. History of rapid heart rate.   ALLERGIES:  1.  SULFA.  2.  CODEINE.  3.  PLAVIX.  4.  CORTISONE.   CURRENT MEDICATIONS:  1.  Synthroid 50 mcg p.o. daily.  2.  Cardizem-CD 240 mg p.o. daily.  3.  Zestoretic 20/12.5 mg one p.o. daily.  4.  Nexium 40 mg p.o. daily.  5.  Caltrate b.i.d.   6.  Tofranil 3 mg p.o. q.h.s.  7.  Prednisone 5 mg p.o. daily.  8.  Valium 5 mg p.o. q.h.s.  9.  Aspirin one p.o. daily.   SOCIAL HISTORY:  The patient is married.  Her husband is a dialysis patient  and requires 24-hour supervision and care.  The patient denies any tobacco  or alcohol abuse, and no drug use history.  The patient does have children,  who live locally.  She is retired from [employer] after 25 years of service.   FAMILY HISTORY:  Significant for lung cancer.   LABORATORY DATA:  Sodium 139, potassium 4.0, BUN 28, creatinine 1.3, glucose  99.  Hemoglobin 12.2, hematocrit 36% obtained from an ISTAT-8, along with a  creatinine measurement in the lab.   PHYSICAL EXAMINATION:  GENERAL:  We have a pleasant Caucasian female lying  in a bed, alert and oriented x3, answering all questions appropriately,  moving her head in all directions without distress.  VITAL SIGNS:  Blood pressure 146/75, pulse 88 and regular, respirations 16  and nonlabored, oxygen saturation 95% on room air, temperature 98.8 degrees  Fahrenheit.  HEENT:  Sclerae anicteric.  Extraocular movements are intact.  There is no  sinus tenderness.  There are no oropharyngeal lesions.  There is no  nystagmus.  Ears reveal cerumen impaction on the right.  NECK:  There is no cervical lymphadenopathy.  There are no carotid bruits.  There is no JVD.  LUNGS:  Clear to auscultation bilaterally when auscultated anteriorly.  CARDIOVASCULAR:  Regular rate and rhythm.  ABDOMEN:  A soft, nontender, and nondistended abdomen.  Bowel sounds are  present.  There is no hepatosplenomegaly.  EXTREMITIES:  There is a compression stocking on the right lower extremity,  trace edema.  Pedal pulses are intact.  NEUROLOGICAL:  Grossly nonfocal, with cranial nerves II-XII being grossly  intact.  The patient can move all four extremities.  Strength is intact grossly.  Light touch is intact and symmetric in the lower extremities and  upper  extremities.  The patient is alert and oriented x3.   ASSESSMENT AND PLAN:  1.  Scalp contusion with a negative head CT for internal bleeding.  Will      provide conservative management.  2.  Vertigo with nausea and vomiting.  Will provide gentle intravenous      fluids, Antivert, and Valium as needed.  This is possibly complicated by  cerumen in the right ear canal, with office visit pending with Dr.      Lazarus Salines.  Will check EKG, orthostatics, and assess the need for stress      dose steroids.  3.  Back pain with a history of extensive back surgery at [facility] remotely, with a history of  osteoporosis followed by Dr.      Danielle Dess in the past.  Will check back films to rule out fracture.  May      need to use a muscle relaxer to decrease spasms if this is evident.  4.  Rheumatoid arthritis, stable on current medications, with no active      synovitis on exam in the peripheral extremities.  5.  Anemia.  Currently will continue proton pump inhibitor.  Hemoglobin is      stable.  6.  Hypertension.  Will continue ACE inhibitor and calcium channel blockade,      but will hold hydrochlorothiazide given dizziness, and assess the need      again for stress dose steroids if the patient is orthostatic and provide      intravenous fluids.  7.  Hypothyroidism:  Again, will continue thyroid supplementation.  8.  Disposition:  Again, the social situation is concerning given that      patient is a primary caregiver for her debilitated husband, with the      family unable to provide significant care at this time.  Will work up      the above and involve a sub-specialist as indicated.  Please note that      the patient is seen by multiple doctors including Dr. Dorena Cookey, Dr.      Danielle Dess, Dr. Lestine Box, Dr. Lazarus Salines, Dr. Deborah Chalk, Dr. Phylliss Bob, and Dr.      Jacky Kindle.      RA/MEDQ  D:  09/14/2004  T:  09/15/2004  Job:  [id number]   cc:   Geoffry Paradise, M.D.  [address]   [place]  [place] [address]  Fax: [phone]

## 2010-11-15 NOTE — Op Note (Signed)
NAME:  [name], [name]                         ACCOUNT NO.:  [id number]   MEDICAL RECORD NO.:  [id number]                   PATIENT TYPE:  INP   LOCATION:  5508                                 FACILITY:  [facility]   PHYSICIAN:  Graylin Shiver, M.D.                DATE OF BIRTH:  Jul 07, 1929   DATE OF PROCEDURE:  DATE OF DISCHARGE:  09/09/2003                                 OPERATIVE REPORT   PROCEDURE PERFORMED:  Esophagogastroduodenoscopy.   ENDOSCOPIST:  Graylin Shiver, M.D.   INDICATIONS FOR PROCEDURE:  The patient has been found to be anemic.  She  has been on Voltaren, aspirin and prednisone for rheumatoid arthritis.  Upper endoscopy is being done to evaluate the upper GI tract for a source of  possible blood loss.  Informed consent was obtained after the risks of  bleeding, infection and perforation.   PREMEDICATIONS:  Fentanyl  50 mcg IV, Versed 10 mg IV.   DESCRIPTION OF PROCEDURE:  With the patient in the left lateral decubitus  position the Olympus gastroscope was inserted into the oropharynx and passed  into the esophagus.  It was advanced down the esophagus and into the stomach  and into the duodenum.  The second portion and bulb of the duodenum were  normal.  The stomach revealed ulceration in the distal prepyloric antrum  with some oozing of blood from the mucosa.  There was no obvious visible  vessel.  The area was washed and the mucosa seems to ooze slightly.  I see  nothing specifically at this point to cauterize.  The rest of the stomach  looked normal.  The esophagus looked normal.  The patient tolerated the  procedure well without complications.   IMPRESSION:  Ulceration in distal prepyloric antrum with some oozing of  blood from the mucosa. I suspect  that this is related to Voltaren and  aspirin and prednisone.  I would recommend holding voltaren an aspirin for  now and treating her with Protonix 40 mg p.o. twice daily.  We will recheck  the H&H  tomorrow.                                               Graylin Shiver, M.D.    SFG/MEDQ  D:  09/09/2003  T:  09/11/2003  Job:  [id number]   cc:   Loraine Leriche A. Waynard Edwards, M.D.  [address]  [place]  [place] [address]  Fax: [phone]   Colleen Can. Deborah Chalk, M.D.  Fax: [phone]

## 2010-11-15 NOTE — Discharge Summary (Signed)
[name], [name]               ACCOUNT NO.:  [id number]   MEDICAL RECORD NO.:  [id number]          PATIENT TYPE:  INP   LOCATION:  6703                         FACILITY:  [facility]   PHYSICIAN:  Geoffry Paradise, M.D.  DATE OF BIRTH:  [birth year]   DATE OF ADMISSION:  09/14/2004  DATE OF DISCHARGE:  09/17/2004                                 DISCHARGE SUMMARY   DIAGNOSES AT THE TIME OF DISCHARGE:  1.  Vertigo most likely secondary labyrinthitis with no evidence of central      nervous system event.  2.  Fall with contusions and acute superimposed on chronic pain.  3.  Essential hypertension.  4.  Hypothyroidism.  5.  Rheumatoid arthritis, on steroids.  6.  Chronic back status post complicated back fusion with compression      fractures.   HISTORY OF PRESENT ILLNESS:  [name] is a 77 year old with multiple  medical problems including rheumatoid arthritis, osteoarthritis, chronic  back status post immobilization and multiple surgeries, osteoporosis with  compression fractures, anemia of chronic disease, chronic steroid  utilization presenting at this time with fall with associated head trauma,  vertigo and vomiting.  For details, see the dictated summary on the chart.   DATA:  CT scan revealed no acute changes, only chronic ischemic changes.  Thoracic spine: No hardware complications, chronic appearing compression  deformity T10, unchanged from July, 2005.  Lumbar Spine: Multi-level  degenerative changes of the thoracolumbar spine with associated scoliosis  deformity, transpedicular screws and Harrington rods unchanged, fracture of  the right-sided rod.  Abdominal series: Nonobstructive bowel gas pattern.  CT of the Head: Left occipital scalp soft tissue swelling, no fracture,  atrophy, periventricular changes.   CHEMISTRY:  Sodium 140, potassium 3.8, chloride 105, CO2 30, glucose 96, BUN  25, creatinine 1.1, calcium 8.9, protein 6.4, albumin 3.3, SGOT 25, SGPT 20,  alkaline  phosphatase 90, bilirubin 0.5.  CBC: White blood cell count 16.5  with hemoglobin 11.8, hematocrit 34, platelet count 221,000.   HOSPITAL COURSE:  The patient was admitted with vertigo, head and back pain,  nausea and vomiting, intolerance to p.o. fluids.  A CT scan of the brain  revealed no evidence of CNS event and thorough thoracic and lumbar workup  revealed good stability compared with July, 2005.  GI symptoms continued  approximately 24-48 hours and settled down with good tolerance of p.o.  intake and discontinuation of IV fluids.  She was continued on her baseline  regimen of prednisone for her rheumatoid arthritis as well as her usual home  medications.  She does have chronic pain, this was well managed with her  usual home regimen.  Prior to discharge she was ambulated back to baseline,  demonstrated no evidence of loss of consciousness with stable neurologic  exams.  She is discharged in much improved activity for further management  as an outpatient.   DISCHARGE MEDICATIONS INCLUDE:  1.  Synthroid 0.05 daily.  2.  Cardizem CD 240 daily.  3.  Zestoretic 20/12.5 daily.  4.  Nexium 40 mg daily.  5.  Caltrate 600 one b.i.d.  6.  Prednisone 5 mg daily.  7.  Valium 5 mg q.h.s.  8.  Aspirin one daily.  9.  Tofranil 50 mg q.h.s.  10. Vicodin p.r.n. pain.  11. The only new medication being meclizine 25 mg t.i.d. as needed for      dizziness.   She is to follow up in the office as scheduled and she was instructed on  caution with regards to ambulation and fall precautions.      RA/MEDQ  D:  09/17/2004  T:  09/17/2004  Job:  [id number]

## 2010-11-15 NOTE — H&P (Signed)
NAME:  [name], [name]                         ACCOUNT NO.:  [id number]   MEDICAL RECORD NO.:  [id number]                   PATIENT TYPE:  INP   LOCATION:  NA                                   [facility]ACILITY:  [facility]   PHYSICIAN:  Leonides Grills, M.D.                  DATE OF BIRTH:  1930-05-04   DATE OF ADMISSION:  DATE OF DISCHARGE:                                HISTORY & PHYSICAL   DIAGNOSES:  1. Right subtalar arthritis, hindfoot.  2. Varus rheumatoid arthritis.  3. Bilateral tight Achilles tendon.   CHIEF COMPLAINT:  Right ankle and hindfoot pain that has gotten worse over  the past year or more.  All conservative treatment has failed at this point.  Now she is using a walker for ambulation and wearing a brace on her right  ankle at all times for support.   PAST SURGICAL HISTORY:  1. Bilateral feet surgery.  2. Breast biopsies x3.  3. Hysterectomy.  4. Back surgery.  5. Right total knee.   PAST MEDICAL HISTORY:  1. Rheumatoid arthritis.  2. GERD.  3. High blood pressure.  4. Hypothyroidism.  5. History of TIA's.   ALLERGIES:  1. CODEINE.  2. SULFA.  3. PLAVIX.   MEDICATIONS:  1. Cardizem.  2. Lisinopril.  3. Celebrex.  4. Imipramine.  5. Nexium.  6. Synthroid.  7. Prednisone.  8. Calcitrate.  9. Osteo-Bio-Flex.  10.      Vitamin E.  11.      Baby aspirin.  12.      Skelaxin currently for back pain.   SOCIAL HISTORY:  She is a nonsmoker.   FAMILY HISTORY:  Father died of lung cancer.   REVIEW OF SYSTEMS:  NEUROLOGIC:  No history of seizures, no paralysis, no  migraines, no anxiety or depression.  PULMONARY:  No asthma, no difficulty  breathing, no COPD, no shortness of breath.  CARDIOVASCULAR:  No  arrhythmias, no angina, no difficulty breathing while laying flat, no  shortness of breath during exertion.  GASTROINTESTINAL:  No bowel or bladder  loss.  Recent loss of appetite.  No nausea or vomiting.  No fevers, no  chills.  She does state she has hot  flashes periodically.   PHYSICAL EXAMINATION:  VITAL SIGNS:  Temperature was 98.8, pulse was 80,  respirations were 20, blood pressure was 120/80.  GENERAL:  She looks well-developed, well-nourished.  HEENT:  Pupils equal, round, reactive to light.  NECK:  Supple, no masses noted.  CHEST:  Clear to auscultation bilaterally.  No wheezes.  BREASTS:  Not pertinent to procedure.  HEART:  Regular rate and rhythm, no murmur detected.  ABDOMEN:  Soft, nontender to palpation.  GENITOURINARY:  Not pertinent to procedure.  EXTREMITIES:  She has hindfoot varus of approximately 7 degrees on the right  and about 3 degrees on the left.  She has tenderness around the subtalar  joint area on the right.  Any motion of the subtalar joint causes severe  pain.  Dorsiflexion and plantar flexion does not cause any pain.  She has  tight heel cords bilaterally and palpable pulses.  DP and PT are equal  bilaterally.  Sensation is intact to light touch and equally bilaterally.  SKIN:  She does have some ecchymoses in certain spots secondary to aspirin  and prednisone, otherwise, no rashes.   LABORATORY DATA:  On x-ray AP and lateral shows severe arthritis of the  right subtalar joint with collapse of the horizontal talus.  There is no  arthritis of the ankle, no talar tilt at the ankle, and no significant  arthritis of the midfoot or forefoot as well.  She has had previous evidence  of stress fractures involving the left forefoot that are shield at this  point.   ASSESSMENT:  1. Right subtalar arthritis with hindfoot varus and collapse with a     horizontal talus secondary to rheumatoid arthritis.  2. Bilateral tight Achilles tendon.   PLAN:  Recommending surgery for hindfoot deformity, right subtalar  distraction, bone block fusion with femoral distractor placed medial.  Also,  percutaneous tendon Achilles lengthening, right iliac crest bone graft, and  this will be done by Dr. Leonides Grills.      Lianne Cure, P.A.                     Leonides Grills, M.D.    MC/MEDQ  D:  12/28/2002  T:  12/28/2002  Job:  [id number]

## 2010-11-15 NOTE — H&P (Signed)
NAME:  [name], [name]                         ACCOUNT NO.:  [id number]   MEDICAL RECORD NO.:  [id number]                   PATIENT TYPE:  INP   LOCATION:  0484                                 FACILITY:  [facility]   PHYSICIAN:  Leonides Grills, M.D.                  DATE OF BIRTH:  May 13, 1930   DATE OF ADMISSION:  01/23/2004  DATE OF DISCHARGE:  01/27/2004                                HISTORY & PHYSICAL   She is a 77 year old female who was seen at the request of W. Danella Maiers, M.D.,  secondary to right ankle hindfoot pain.  She has had this pain for the last  three to four months, and it has been progressively worse.  It can be  severe, almost always painful.  She has known rheumatoid arthritis for 38  years now, has had progressive pain in this area to the point where she can  no longer do daily activities without limitations.  She can walk less than a  block.  She has severe difficulty on uneven terrain, stairs, inclines,  ladders.  She does have an obvious limp to her gait.  She wears comfortable  shoes.  Currently she is wearing postop shoe for the past two weeks, which  has helped some, but she has persistent pain.  Her pain is described as a  10/10, interfering with her life.   PAST SURGICAL HISTORY:  1.  Back surgery at [facility] in 1998.  2.  Knee replacement surgery in 2001 on the right side.   CURRENT MEDICATIONS:  Cardizem, lisinopril, Celebrex, imipramine, Nexium,  Synthroid, prednisone, Caltrate, Osteo Bi-Flex, vitamin E, and a baby  aspirin daily.   Drug allergies include CODEINE, SULFA, PLAVIX.   SOCIAL HISTORY:  She is a nonsmoker, non-alcohol use.   PHYSICAL EXAMINATION:  VITAL SIGNS:  She is approximately 5 feet 4 inches,  weighs 160 pounds.  Respirations are 18.  GENERAL:  She is well-developed, well-nourished, no apparent distress.  A  very pleasant female.  CARDIAC:  Regular rate and rhythm.  CHEST:  Lungs clear to auscultation bilaterally.  ABDOMEN:  Soft, nontender  to palpation.  BREASTS:  Not pertinent to procedure.  GENITOURINARY:  Not pertinent to procedure.  EXTREMITIES:  She has hindfoot varus approximately 7 degrees on the right  and about 3 degrees on the left in a stance.  She has tenderness around the  subtalar joint area when motion is incurred on the subtalar.  Dorsiflexion,  plantar flexion do not cause pain in the ankle area.  She does have  bilateral tight heel cords.  She has palpable DP and PT pulses, which are  equal bilaterally.  Sensation to light touch is intact and equal  bilaterally.   REVIEW OF SYSTEMS:  She has no history of seizure, blurred vision,  dizziness.  Hearing is intact.  Speech is clear.  No difficulties with  swallowing.  No  chronic cough.  No shortness of breath.  No chest pain,  angina.  No urinary incontinence, bowel incontinence.  No melena or bloody  stools noted.   X-rays were taken.  Weightbearing AP, lateral bilateral foot and ankle views  as well as bilateral oblique feet views showed severe arthritis of the right  subtalar joint with collapse in the horizontal talus.  There is no arthritis  of the ankle, no tilt at the ankle.  Mortise is intact.  No significant  arthritis of the midfoot or forefoot.  She does have previous evidence of  stress fracture involving the left forefoot that has healed.   ASSESSMENT:  Right subtalar arthritis with hindfoot varus and collapse of  the horizontal talus secondary to rheumatoid arthritis.   PLAN:  She is going to have surgery by Dr. Leonides Grills on January 23, 2004.  We plan on subtalar fusion with iliac bone graft.  She is going to be  nonweightbearing on the right lower extremity for as far as out as three  months.  She will be admitted to [facility] for a 23-hour stay initially  for stabilization, pain control, and advanced to  p.o. medication.  She will  follow up in the office in two weeks with Dr. Lestine Box.  She will start  aspirin 325 mg b.i.d. for one month  for DVT prophylaxis.     Lianne Cure, P.A.                     Leonides Grills, M.D.    MC/MEDQ  D:  02/29/2004  T:  02/29/2004  Job:  [id number]

## 2010-11-15 NOTE — H&P (Signed)
[facility]  Patient:    [name], [name]                     MRN: [id number] Adm. Date:  10/08/00 Attending:  Claude Manges. Cleophas Dunker, M.D. Dictator:   Jamelle Rushing, P.A.                         History and Physical  DATE OF BIRTH:  12/20/1929  CHIEF COMPLAINT:  Right knee pain.  HISTORY OF PRESENT ILLNESS:  The patient is a 77 year old female with a history of being diagnosed with rheumatoid arthritis at about the age 51.  The patient started developing right knee pain around 1979 and her rheumatologist gave her a cortisone injection, which gave her significant improvement, up until the last 2-3 years.  Over the last 2-3 years, the patient started developing return of her pain.  She started developing mechanical symptoms such as popping, cracking, and swelling in her knee.  It worsened with ambulation and over the last six months she started developing a significant valgus deformity.  The patient describes the pain as a constant aching sensation, a 10/10.  It does bother her at night and it does radiate down to the calf.  She is not currently using any ambulation device.  ALLERGIES:  1. CODEINE.  2. SULFA.  3. PLAVIX.  CURRENT MEDICATIONS:  1. Cardizem 240 mg q.d.  2. Voltaren Time Release 100 mg p.o. q.d.  3. Zestoretic 20/12.5 q.d.  4. Prednisone 5 mg q.d.  5. Synthroid 5 mg p.o. q.d.  6. Prilosec 20 mg p.o. q.d.  7. Imipramine 50 mg p.o. q.d.  8. Premarin 0.625 mg p.o. q.d.  9. Diazepam 5 mg p.o. q.p.m. 10. Aspirin 81 mg p.o. q.d.  PAST MEDICAL HISTORY:  The patient has been diagnosed with hypertension for at least 10 years now.  Her medication has been stable for about the last six months and she is being treated by Dr. Jacky Kindle.  The patient also has a history of thyroid disease and hiatal hernia.  The patient also had a stroke affecting her right side of her brain and the left side of her body.  She has no residual side effects.   She was managed by Dr. Anne Hahn for this stroke. Otherwise, the patient denies any diabetes, heart disease, or asthma.  PAST SURGICAL HISTORY:  Hysterectomy in 1980, a rectocele repair in 1980, three breast biopsies, and a complicated back fusion.  The patient states that the back fusion required 13 units of blood to be transfused and this was done at [facility].  Otherwise, she denies any other complications.  SOCIAL HISTORY:  The patient is a 77 year old white female who denies any smoking or alcohol use.  She is married.  She has three grown children.  She lives in a [place] house.  She is currently employed at [employer] for the last 25 years.  Family physician is Dr. Geoffry Paradise, [phone].  Neurosurgeon Dr. Danielle Dess.  FAMILY HISTORY:  Mother is alive in fairly good health with only arthritis at 11.  Father is deceased at 45 years of age from lung cancer.  She has one brother and one sister alive and both in good health.  REVIEW OF SYSTEMS:  The patient does have glasses, for which she uses for reading.  She also has occasional problems with reflux, which has been fairly well-controlled with the Prilosec.  Otherwise, the review of  systems is negative and noncontributory in all other categories.  PHYSICAL EXAMINATION:  VITAL SIGNS:  Pulse 92, respirations 14, temperature 98.4, blood pressure 130/68, height 5 feet 6 inches, weight 140 pounds.  GENERAL:  This is an elderly female who appears slightly older than stated age.  She appears to be in a moderate amount of discomfort with ambulation with a significant valgus deformity of her right leg.  She has difficulty getting on and off the exam table and she has significant deformities of the upper hands related to the rheumatoid arthritis.  HEENT:  Head is normocephalic, atraumatic.  Nontender over maxillary or frontal sinuses.  Pupils are equal, round and reactive to light accommodating to light.  Extraocular movements are intact.  Sclera  is not icteric. Conjunctiva is pink and moist.  External ears without deformities.  Canals patent.  TMs pearly-gray and intact.  Gross hearing is intact.  Nasal septum was midline.  Mucous membranes pink and moist.  No polyps noted.  Oral buccal mucosa was pink and moist without lesions.  Dentition was in fair repair.  The patient is able to swallow without any difficulty.  NECK:  Supple.  No palpable lymphadenopathy.  Thyroid gland was nontender. The patient had excellent range of motion of her cervical spine without any difficulty or tenderness.  CHEST:  Lung sounds were clear and equal bilaterally.  No wheezes, rales, rhonchi, or rubs noted.  HEART:  Regular rate and rhythm.  S1 and S2 was auscultated.  No murmurs, rubs, or gallops noted.  ABDOMEN:  Round, soft, nontender.  No hepatosplenomegaly.  Bowel sounds were normoactive throughout.  CVA was nontender to percussion.  EXTREMITIES:  Upper extremities:  Bilateral hands had rheumatoid arthritic-appearing MCP joints with the beginnings of ulnar deviation on bilateral hands.  Otherwise, she had good range of motion of her shoulders, elbows, and wrists without any difficulty and she had 5/5 motor strength. Bilateral hips had full extension-flexion back to 120 degrees with 20-30 degrees internal-external rotation without any difficulty or tenderness. The left knee had no obvious erythema, ecchymosis, or soft tissue swelling. There was no palpable effusion.  She had no medial or lateral joint line tenderness.  No anterior or posterior draw but she had about 10 degrees valgus-varus laxity with stressing.  She had no calf tenderness.  Right knee: There was no sign of erythema or ecchymosis.  She had no soft tissue swelling. She had no palpable effusion.  She had no anterior-posterior draw.  She had about 20 degrees of valgus-varus laxity with stress.  With standing, she had about 16 to 18 degrees of valgus deformity at the knee.  Calf  was nontender. Bilateral ankles were symmetrical and good dorsi- and plantiflexion.   PERIPHERAL VASCULATURE:  Carotid pulses were 2+, no bruits.  Radial pulses were 1+, femoral pulses were 2+, dorsalis pedis pulses were 1+, and she had no lower extremity edema or venostasis changes.  NEUROLOGIC:  The patient was conscious, alert, and appropriate.  Held an easy conversation with the examiner.  Cranial nerves II-XII were grossly intact. Deep tendon reflexes of the upper and lower extremities were symmetrical and intact.  The patient was grossly intact to light touch sensation from head to toe.  BREASTS/RECTAL/GENITOURINARY:  Deferred at this time.  IMPRESSION: 1. Severe end-stage rheumatoid arthritis right knee with valgus deformity. 2. Hypertension. 3. Thyroid disease. 4. Hiatal hernia. 5. History of stroke.  PLAN:  The patient will be admitted to [facility] October 08, 2000  under the care of Dr. Norlene Campbell.  The patient will undergo a right total knee arthroplasty.  The patient will undergo all routine labs and tests prior to this procedure.  The patient has not donated any blood for this surgical procedure. DD:  10/06/00 TD:  10/06/00 Job: [id number]

## 2010-11-15 NOTE — Discharge Summary (Signed)
NAME:  [name], [name]                         ACCOUNT NO.:  [id number]   MEDICAL RECORD NO.:  [id number]                   PATIENT TYPE:  INP   LOCATION:  5508                                 FACILITY:  [facility]   PHYSICIAN:  Barry Dienes. Eloise Harman, M.D.            DATE OF BIRTH:  09/01/1929   DATE OF ADMISSION:  09/07/2003  DATE OF DISCHARGE:  09/09/2003                                 DISCHARGE SUMMARY   PERTINENT FINDINGS:  The patient is a 77 year old white female with multiple  medical problems, including a longstanding history of rheumatoid arthritis  for which she is chronically on prednisone, hypertension, hypothyroidism,  and chronic anemia.  She was to have right ankle surgery; however, this was  postponed due to a preop hemoglobin of 6.8.  The patient presented to the  office for evaluation, where a recheck hemoglobin level was 6.8 with a low  MCV.  She denied melena, hematochezia, or abdominal pain; however, she did  note progressive weakness, fatigue, and feeling cold over the past eight  weeks.  She was admitted for further evaluation.   See Dr. Laurey Morale extensive history and physical for details of her past  medical history.   Medications prior to admission were notable for:  1. Chronic prednisone treatment at 5 mg daily.  2. Aspirin 81 mg daily.  3. Nexium 40 mg daily.  4. Voltaren XR 100 mg daily.   A full list of her medications was noted in the admission history and  physical.   INITIAL PHYSICAL EXAMINATION:  VITAL SIGNS:  Temperature 97.5, pulse 117,  respiratory rate 22, blood pressure 143/71.  Oxygen saturation 98% on room  air.  Weight 68.9 kg.  GENERAL:  She is a well-nourished, well-developed white female who was  somewhat pale.  HEENT:  Within normal limits.  NECK:  Without jugular venous distention or carotid bruit.  CHEST:  Clear to auscultation.  CARDIAC:  Regular rate and rhythm with tachycardia.  ABDOMEN:  Normal bowel sounds with no  hepatosplenomegaly or tenderness.  EXTREMITIES:  Without peripheral edema.  NEUROLOGIC:  Nonfocal.  RECTAL:  Normal stool, which was brown and only trace Hemoccult-positive (on  ongoing aspirin treatment).   LABORATORY DATA:  White blood cell count 14.9, hemoglobin 6.8 with MCV 69.8,  platelet count 413.  Serum sodium 134, potassium 4.5, chloride 103, CO2 23,  BUN 32, creatinine 1.5, glucose 130.  EKG showed sinus tachycardia, left  atrial enlargement, possible old inferior wall myocardial infarction, no  acute ST segment or T-wave abnormalities.   HOSPITAL COURSE:  The patient was admitted to a medical bed for further  evaluation.  She was seen by consultants from GI and cardiology.  She was  given a transfusion of two units of packed red blood cells without  difficulty.  An EGD was done on March 12.  It showed ulcerations in the  distal prepyloric region and antrum with minimal oozing  from the mucosa but  no visible vessel.  A recommendation was made to hold aspirin and Voltaren  and double her dose of proton pump inhibitor treatment.  The patient also  had an echocardiogram that showed normal left ventricular systolic function  with normal regional wall motion and mild mitral regurgitation.   On the day of discharge she was without shortness of breath, abdominal pain,  chest pain.  She was eating well and her stools had a normal appearance.  Her most recent vital signs included a blood pressure of 138/70, pulse 88,  respirations 20, temperature 97.5.  Her most recent hemoglobin was 8.9 with  hematocrit 27.8, and her most recent BUN and creatinine were 20 and 1.1.  Her abdomen was soft with normal bowel sounds and no tenderness.   DISCHARGE DIAGNOSIS:  1. Upper gastrointestinal bleed due to nonsteroidal anti-inflammatory drug-     induced mucosal ulceration.  2. Iron-deficiency anemia.  3. Rheumatoid arthritis.  4. Chronic prednisone treatment.  5. Lumbar spine degenerative disk  disease.  6. Hypothyroidism.  7. Hypertension.  8. History of 2001 cerebrovascular accident.  9. Osteoarthritis.  10.      Osteoporosis.   DISCHARGE MEDICATIONS:  1. Darvocet-N 100 one tablet p.o. q.i.d. p.r.n. pain, #120.  2. Miralax 17 g p.o. daily.  3. Nexium 40 mg p.o. b.i.d.  4. Valium 5 mg p.o. q.p.m.  5. Prednisone 5 mg p.o. daily.  6. Cardizem CD 240 mg p.o. daily.  7. Synthroid 50 mcg p.o. daily.  8. Tofranil 3 mg p.o. daily.  9. Caltrate-D 600 mg one tablet p.o. b.i.d.  10.      Centrum Silver one tablet p.o. daily.  11.      Lisinopril/HCT 20/12.5 mg one tablet p.o. daily.  12.      Miacalcin 200 units intranasally once daily.  13.      Osteo Bi-Flex as needed.  14.      Nu-Iron 150 mg one tablet p.o. daily.   SPECIAL INSTRUCTIONS:  She was advised to no longer take Voltaren and to  hold her aspirin treatment for seven days and then restart aspirin with  Ecotrin 81 mg daily.   FOLLOW-UP PLANS:  She should have a follow-up appointment with Dr. Jacky Kindle  within one to two weeks and was advised to call [phone].  She was also  advised to schedule a follow-up outpatient adenosine Cardiolite test with  Dr. Deborah Chalk in one to two weeks following discharge and was advised to call  [phone] to schedule this appointment.   Please note that the process of discharge required 45 minutes.                                                Barry Dienes Eloise Harman, M.D.    DGP/MEDQ  D:  09/09/2003  T:  09/09/2003  Job:  [id number]   cc:   Geoffry Paradise, M.D.  [address]  [place]  [place] [address]  Fax: [phone]   Petra Kuba, M.D.  [address]  [place]  [place] [address]  Fax: [phone]   Areatha Keas, M.D.  [address]  [address]  [place]  [place] [address]  Fax: [phone]   Leonides Grills, M.D.  [address]  [place]  [place] [address]  Fax: [phone]   Colleen Can. Deborah Chalk, M.D.  Fax: [phone]

## 2010-11-15 NOTE — Op Note (Signed)
NAME:  [name], [name]                         ACCOUNT NO.:  [id number]   MEDICAL RECORD NO.:  [id number]                   PATIENT TYPE:  OBV   LOCATION:  0272                                 FACILITY:  [facility]   PHYSICIAN:  Leonides Grills, M.D.                  DATE OF BIRTH:  January 23, 1930   DATE OF PROCEDURE:  01/23/2004  DATE OF DISCHARGE:                                 OPERATIVE REPORT   PREOPERATIVE DIAGNOSES:  1. Right subtalar arthritis.  2. Right-sided Achilles tendon.  3. Right flexor hallucis longus synovitis.   POSTOPERATIVE DIAGNOSIS:  1. Right subtalar arthritis.  2. Right-sided Achilles tendon.  3. Right flexor hallucis longus synovitis.   OPERATIONS:  1. Right subtalar distraction bone block fusion.  2. Right iliac crest bone graft.  3. Right stress x-rays, ankle.  4. Right percutaneous tendon lengthening.  5. Right sural nerve neurolysis.  6. Right flexor hallucis longus tenolysis.   ANESTHESIA:  General endotracheal tube.   SURGEON:  Leonides Grills, M.D.   ASSISTANT:  Lianne Cure, P.A.   ESTIMATED BLOOD LOSS:  Minimal.   TOURNIQUET TIME:  Approximately two hours.   COMPLICATIONS:  None.   DISPOSITION:  Stable.   INDICATIONS:  This is a 77 year old female with longstanding rheumatoid  arthritis with the above pathology.  She was consented for the above  procedure.  All risks, which include infection, vessel injury, nonunion,  malunion, __________ or failure, persistent pain, worsening pain, stiffness,  arthritis of the neighboring joints were all explained.  Questions were  encouraged and answered.   Patient was operating room and placed in supine position after good general  endotracheal anesthesia was administered as well as Ancef 1 gm IV piggyback.  Patient was then placed in the lateral decubitus position with the operative  side up.  All bony prominences were well padded.  An axial roll was placed.  This was done on a beanbag.  The right lower  extremity as well as the right  iliac crest bone graft site were prepped and draped in a sterile manner.  With a proximally placed thigh tourniquet the limb was exsanguinated.  The  tourniquet was elevated to 209 mmHg.  A longitudinal incision was made in  the midline between the posterolateral aspect and the peroneal tendon, and  the anterolateral aspect of the Achilles tendon was then made.  Dissection  was carried down through the skin.  Hemostasis was obtained.  A formal sural  nerve neurolysis was then performed.  Once this was done and the nerve was  freed, we then approached the posterolateral aspect of the ankle.  A  longitudinal capsulotomy was then made within the ankle, and synovitis was  then debrided with the rongeur.  The subtalar joint was then entered.  With  laminar spreaders stretching the medial capsule, the medial capsule was  released.  FHL was identified, and there was a  large amount of synovitis in  this area.  A formal FHL tenolysis was then performed down to the _________  tenaculum.  This was completely freed and retracted out of harm's way.  With  the FHL protected the entire lateral aspect of the subtalar joint, the  capsule was then released.  We were able to restore the hindfoot height  adequately and plantar flex the talus adequately as well.  We then went to  harvest the iliac crest bone graft.  A longitudinal incision was then made  over the iliac crest graft, over the crest tuber.  Dissection was carried  through the skin, and hemostasis was obtained.  We then directly dissected  down to the crest.  Soft tissue was elevated along the inner and outer  table.  Tricortical bone blocks were obtained with one being approximately  15 mm in height and trapezoidal in shape.  Cancellous graft was also  obtained.  Bone wax was then applied with the knee-exposed surfaces.  Deep  tissues were closed with 0 Vicryl.  The subcu was closed with 2-0 Vicryl.  The skin was  closed with 4-0 Monocryl subcuticular stitch.  The Steri-Strips  were applied.  Prior to closure, the wound was copiously irrigated with  normal saline.  Marcaine 0.5% without epinephrine was placed in the wound,  10 cc.  We then placed the bone block into the subtalar joint using laminar  spurs after a cancellous graft was first place as well as multiple 2 mm  drill holes were placed on either side of the joints respectively.  Once the  graft was tamped into place, x-rays were obtained that showed that this was  in a proper position.  We then placed two 6.5 mm 16 mm partially threaded  cancellous screws, one into the body and one into the neck through a stab  wound in the midline longitudinal in the labral skin border of the heel.  This had excellent purchase and also helped correct the hindfoot in a proper  position.  Visualization of the hindfoot:  The hindfoot was in about 5  degrees of hindfoot valgus and in excellent position.  At this point, stress  x-rays were obtained and showed that this was a stable construct, and there  was no gross motion at the fusion site, which was solid.  Final x-rays are  obtained in the AP ankle, lateral hindfoot, canalis view, and showed that  the fixation was in the proper position and adequate correction as well.  At  this point, we then performed a percutaneous tendon lengthening with two  medial and one lateral hemisection of the Achilles tendon and had excellent  release of the tight Achilles tendon.  The wound was copiously irrigated  with normal saline.  The tourniquet was deflated.  Hemostasis as obtained.  The subcuticular was closed with 3-0 Vicryl.  The skin was closed with 4-0  nylon.  Sterile dressing was applied.  A modified Jones dressing was  applied.  Patient was stable to the PAR.                                               Leonides Grills, M.D.   PB/MEDQ  D:  01/23/2004  T:  01/23/2004  Job:  [id number]

## 2010-11-15 NOTE — Procedures (Signed)
[facility]  Patient:    [name], [name]                    MRN: [id number] Proc. Date: 01/06/01 Adm. Date:  [id number] Disc. Date: [id number] Attending:  Herold Harms CC:         Pearletha Furl. Jacky Kindle, M.D.  Oley Balm Georgina Pillion, M.D.   Procedure Report  PROCEDURE:  Colonoscopy.  INDICATION FOR PROCEDURE:  Recent hemoglobin of 8 with last colonoscopy five years ago.  DESCRIPTION OF PROCEDURE:  The patient was placed in the left lateral decubitus position then placed on the pulse monitor with continuous low flow oxygen delivered by nasal cannula. She was sedated with 100 mg IV Demerol and 11 mg IV Versed. The Olympus video colonoscope was inserted into the rectum and advanced to what I felt was the cecum but the right lower quadrant colonic anatomy was distorted and confusing. There were two sequential concentric fibrotic appearing rings, one about 2 cm and the other about 5 cm from what appeared to be a blind termination of the cecum. I could not clearly see appendiceal orifice and the patient informed me while sedated during the procedure, she had had an appendectomy some time in the past. I could not clearly identify the ileocecal valve but thought it was probably involved with the more distal of two strictured appeared areas. The prep was excellent. Other than the above mentioned abnormalities which were photographed, no abnormalities were seen in the right colon and whats presumed to be the transverse, descending, sigmoid and rectum. The scope was withdrawn. The patient returned to the recovery room in stable condition. The patient tolerated the procedure well and there were no immediate complications. After the procedure, I reviewed my previous colonoscopy report from 1997 and there was no mention of the abnormalities I saw on this procedure. I also was able to determine that her appendectomy had been done around 1980 and she is not aware of any  other intestinal surgery.  IMPRESSION:  Unusual alteration of right colonic anatomy with what appeared to be two concentric rings or stricture within 6 cm of the cecum with distortion of the anatomy precluding precise identification of the ileocecal valve and ileal orifice.  PLAN:  Will inquire further about any past surgical history and if the changes between her previous colonoscopy now cannot be clearly attributed to any recent colonic surgery will obtain barium enema. DD:  01/06/01 TD:  01/06/01 Job: [id number]

## 2010-11-15 NOTE — Op Note (Signed)
[name], [name]               ACCOUNT NO.:  [id number]   MEDICAL RECORD NO.:  [id number]          PATIENT TYPE:  AMB   LOCATION:  ENDO                         FACILITY:  [facility]   PHYSICIAN:  John C. Madilyn Fireman, M.D.    DATE OF BIRTH:  02-Nov-1929   DATE OF PROCEDURE:  07/23/2004  DATE OF DISCHARGE:                                 OPERATIVE REPORT   PROCEDURE:  Esophagogastroduodenoscopy with biopsy.   INDICATION FOR PROCEDURE:  Anemia and rectal bleeding with history of  gastric ulcer on previous EGD in March 2005.   PROCEDURE:  The patient was placed in the left lateral decubitus position  and placed on the pulse monitor with continuous low flow oxygen delivered by  nasal cannula.  She was sedated with 87.5 mcg IV fentanyl and 9 mg IV  Versed.  The Olympus video endoscope was advanced under direct vision into  the oropharynx and esophagus. The esophagus was straight and of normal  caliber with the squamocolumnar line at 30 cm above a 5-cm hiatal hernia.  There was no obvious ring, stricture, or other abnormality of the GE  junction.  I did not see any visible esophagitis.  The stomach was entered  and a small amount of liquid secretions were suctioned from the fundus.  Retroflexed view of the cardia confirmed a large hiatal hernia and was  otherwise unremarkable.  The fundus and body appeared normal.  The antrum  showed some mild erythema and granularity consistent with mild antral  gastritis.  A CLO test was obtained.  The duodenum was entered and both the  bulb and second portion were well inspected and appeared to be within normal  limits.  The scope was then withdrawn and the patient prepared for  colonoscopy.  She tolerated the procedure well and there were no immediate  complications.   IMPRESSION:  1.  Large hiatal hernia.  2.  Mild antral gastritis.   PLAN:  Will await CLO test and proceed with colonoscopy.      JCH/MEDQ  D:  07/23/2004  T:  07/23/2004  Job:   [id number]   cc:   Geoffry Paradise, M.D.  [address]  [place]  [place] [address]  Fax: [phone]

## 2010-11-15 NOTE — Discharge Summary (Signed)
[facility]  Patient:    [name], [name]                    MRN: [id number] Adm. Date:  [id number] Disc. Date: [id number] Attending:  Herold Harms Dictator:   Junie Bame, P.A. CC:         Claude Manges. Cleophas Dunker, M.D.  Richard A. Jacky Kindle, M.D.  Sherry A. Rosalio Macadamia, M.D.   Discharge Summary  DISCHARGE DIAGNOSES: 1. Hypothyroidism. 2. Rheumatoid arthritis. 3. Status post right total knee secondary to end-stage rheumatoid arthritis. 4. Hypertension. 5. Constipation. 6. Hiatal hernia. 7. History of cerebrovascular accident. 8. Right forearm fracture (right wrist fracture).  HISTORY OF PRESENT ILLNESS:  Mrs. Merida is a 77 year old white female with a past medical history of rheumatoid arthritis since age 32 and hypertension was admitted to [facility] on October 08, 2000 for right total knee arthroplasty by Dr. Cleophas Dunker due to progressing right knee pain x 2-3 years (end-stage RA). Postoperative complications include post hemorrhagic anemia for which she received 2 units of packed red blood cells.  Patient presently on Coumadin for DVT prophylaxis.  PT reports that she was ambulating with moderate assist 5 feet with platform rolling walker and she transfers sit to stand with maximum assist, 50% partial weightbearing.  Patient broke her right arm approximately two weeks ago after a fall and she has a cast.  Primary care physician is Dr. Jacky Kindle.  PAST MEDICAL HISTORY:  Significant for rheumatoid arthritis, hypertension, thyroid disease, hiatal hernia, CVA, three breast biopsies, fracture right forearm.  PAST SURGICAL HISTORY:  Significant for hysterectomy, rectocele repair, and back fusion.  ALLERGIES:  CODEINE, SULFA, and PLAVIX.  SOCIAL HISTORY:  Patient is married with three grown children, lives in [place] home.  One step to entry.  Employed by [employer] for 25 years.  She has no history of alcohol or tobacco abuse.  MEDICATIONS  PRIOR TO ADMISSION:  Cardizem, Zestoretic, prednisone, Synthroid, Prilosec, imipramine, Tenormin, diazepam, aspirin, Voltaren.  NEUROLOGIST:  Dr. Anne Hahn.  HOSPITAL COURSE:  Mrs. Oconnor was admitted to rehab on October 12, 2000 for comprehensive inpatient rehabilitation where she received more than three hours of physical therapy and occupational therapy daily.  Mrs. Cumbo did fairly well during her nine-day stay in rehab.  Her hospital course is significant for constipation, insomnia, and anemia.  Patient remained on Coumadin for DVT prophylaxis during her entire stay.  She was placed on multivitamin daily and Restoril 15 mg as needed for sleep.  Her pain was controlled with appropriate analgesics.  Her high blood pressure remained stable on diazepam, HCTZ, and Lisinopril.  Patient did not have any RA flares and she was on prednisone 5 mg daily for RA.  She was placed on Colace 100 mg p.o. b.i.d. for constipation.  Latest labs indicate her potassium was 4.4, sodium 138, BUN 28, glucose 83.  White blood count 8.8, hemoglobin 10.1, and hematocrit 30.3.  Prior to discharge her right arm cast was removed and x-rayed.  X-ray still revealed a right lower wrist fracture.  Patient was recast and will have to follow up with Dr. Cleophas Dunker within two weeks.  Vital signs were stable. Patient was ambulating with platform walker with minimal assist 120 feet and she had 90 degrees flexion.  She performed independently all the ADLs. Staples were removed and surgical incision showed no sign of infection.  DISCHARGE MEDICATIONS: 1. Colace 100 mg one tablet twice daily. 2. Cardizem 240 mg one tablet daily.  3. Zestoretic 20/1.5 one tablet daily. 4. Synthroid 50 mcg one tablet daily. 5. Prilosec 20 mg one tablet in a.m. 6. Premarin 0.625 mg one tablet daily. 7. Prednisone 5 mg one tablet daily. 8. Imipramine 50 mg one tablet daily. 9. Oxycodone 5 mg 1-2 tablet as needed q.4-6h. for pain.  ACTIVITY:   She is to use the walker and observe knee precautions.  DIET:  Regular.  FOLLOW-UP:  She will receive [facility] for physical therapy, occupational therapy, and home health nurse to monitor INR.  First draw Friday, October 23, 2000.  She is to follow up with Dr. Cleophas Dunker within two weeks.  Follow up with Dr. Jacky Kindle 4-6 weeks.  Follow up with Dr. Lamar Benes as needed.  Follow up with Dr. Floyde Parkins, OB/GYN, within six weeks if needed. DD:  10/21/00 TD:  10/22/00 Job: [id number]

## 2010-11-15 NOTE — Discharge Summary (Signed)
NAME:  [name], [name]                         ACCOUNT NO.:  [id number]   MEDICAL RECORD NO.:  [id number]                   PATIENT TYPE:  INP   LOCATION:  0484                                 FACILITY:  [facility]   PHYSICIAN:  Leonides Grills, M.D.                  DATE OF BIRTH:  1929/07/28   DATE OF ADMISSION:  01/23/2004  DATE OF DISCHARGE:  01/26/2004                                 DISCHARGE SUMMARY   /ADMISSION DIAGNOSES/>  1. Right sub talar arthritis.  2. Long-standing rheumatoid arthritis.  3. Hypertension.  4. Hypothyroidism.  5. Chronic anemia.    DISCHARGE DIAGNOSES:  1. Sub talar bone block fusion of the right foot with iliac bone crest     graft.  2. Chronic anemia.  3. Hypertension.  4. Rheumatoid arthritis.  5. Hypothyroidism.   PROCEDURE:  Under general anesthesia she was taken to the operating room on  January 22, 2004 by Dr. Leonides Grills to perform sub talar bone block fusion of  the right foot with iliac bone graft.   CONSULTATIONS:  None.   BRIEF HISTORY:  This is a 77 year old female with long-standing rheumatoid  arthritis.  Conservative management attempted and it was decided in the end  results that rheumatoid arthritis sub talar arthritis was interfering with  her life; she was unable to do activities of daily living as well as social  activity secondary the pain.  Therefore it was decided to do a surgical  procedure as above the remedy the pain and discomfort.   LABORATORY DATA:  On admission, January 23, 2004, she had a white cell count of  10.2 thousand, red blood cells 3.8, hemoglobin was 11.7, hematocrit 34.2,  platelet count 255,000.  Sodium was 140, potassium 3.9, chloride 104, CO2  was 32, glucose 88, BUN 23, creatinine 1.2, calcium 9.5.  An ECG showed  normal sinus rhythm, inferior infarct, age undetermined.  Abnormal ECG, no  significant change since last tracing.  This was confirmed by Dr. Viann Fish (date performed was September 08, 2003).   Outside x-rays were brought in  from out office by Dr. Leonides Grills.   HOSPITAL COURSE:  The patient was taken to surgery by Dr. Leonides Grills on  January 23, 2004 to perform right sub talar bone block fusion with iliac crest  bone graft, placement of hardware.  The patient was then admitted to [facility] on the fourth floor.  The patient tolerated the procedure well with no  complications.  Over night the patient was unable to eat or drinks secondary  to some bouts of nausea and pain control issues.  She was receiving Morphine  1 to 4 q.2 to 4h p.r.n. for pain control.  On postoperative day 1, January 24, 2004, the patient was able to drink minimal clear liquids.  We did get a  physical therapy consult.  The patient was unable  to be independently  mobile.  It was then decided to do a rehab consult to plan for discharge.  The patient was change from a 23 hour admission on postoperative day 1  secondary to pain control and inability to eat, and non independent  mobility.  On day 2, January 25, 2004 the patient was afebrile in the a.m.  A T-  max of 101.1. Her hemoglobin was stable at 10.1, hematocrit 29.4.  Sodium  was 135, potassium 3.8, CO2 99, BUN 13, creatinine 1.3, glucose slightly  elevated at 118.  Right lower extremity splint was clean, dry and intact.  Sensation to light touch was intact, active range of motion of toes intact.  Hip dressing without active drainage, clean, dry and intact.  Heart had a  regular rate and rhythm.  Lungs were clear to auscultation bilaterally.  The  patient was in no apparent distress.  Rehabilitation consult was actually  carried out on January 25, 2004.  The decision is still pending whether or not  she will go to [facility] rehab.  The patient has requested to go to a  nursing facility, [facility] home.  That will be decided today  based on bed availability and physical therapy is going to work with her  again today.   DISPOSITION:  The patient  is stable.  She will be discharged today, January 26, 2004.   DISCHARGE MEDICATIONS:  1. Will include current home medications taken as before.  2. We are going to send her on Mepergan fortis p.o. 1-2 q.4-6h p.r.n. for     pain.  3. Full strength aspirin 325 b.i.d. for 1 month for deep venous thrombosis     prophylaxis.  4. Robaxin 500 mg p.o. q.6h p.r.n. for muscle spasms.   PLAN:  Right lower extremity non-weight bearing, elevation above the level  of the heart is encouraged when at rest.  Active range of motion of the toes  is encouraged.  The dressing should remain clean, dry and intact.  The hip  dressing need to stay on x7 days from date of surgery which was January 23, 2004, it can then be removed and a clean dry dressing applied until there is  no drainage whatsoever.   She will follow up in the office of Dr. Leonides Grills in 2 weeks from date of  surgery.  At that time we will remove the stitches, remove the dressing and  place her in short leg non weight bearing cast.   DIET:  As tolerated.     Lianne Cure, P.A.                     Leonides Grills, M.D.    MC/MEDQ  D:  01/26/2004  T:  01/26/2004  Job:  [id number]

## 2010-11-15 NOTE — Consult Note (Signed)
NAME:  [name], [name]                         ACCOUNT NO.:  [id number]   MEDICAL RECORD NO.:  [id number]                   PATIENT TYPE:  INP   LOCATION:  5508                                 FACILITY:  [facility]   PHYSICIAN:  Graylin Shiver, M.D.                DATE OF BIRTH:  [birth year]   DATE OF CONSULTATION:  DATE OF DISCHARGE:  09/09/2003                                   CONSULTATION   REASON FOR CONSULTATION:  This patient is a 77 year old female who was  experiencing progressive weakness and shortness of breath over the last  couple of weeks.  She was going to have surgery on her right ankle, but as  part of a preoperative evaluation, she was found to be anemic with a  hemoglobin of 6.8.  Because of this, she was admitted to the hospital.  She  has received a couple of units of packed red cells and feels better today.  Her post-transfusion hemoglobin is 8.9.  The patient does give a history of  rheumatoid arthritis and has been taken Voltaren XR and also aspirin.  She  has also been taking Nexium 40 mg daily.  She gives no history of epigastric  pain.  She denies dysphagia, odynophagia, nausea, vomiting, hematemesis,  melena or hematochezia.  She had a colonoscopy done in 2002 by Dr. Madilyn Fireman  with findings of diverticulosis.  She also had a barium enema back then also  showing diverticulosis.  The etiology of her recent anemia is unclear.  The  patient does give a history of anemia in the past.   PAST HISTORY:  Rheumatoid arthritis, history of anemia, hypothyroidism,  hypertension, history of a CVA in the past, osteoarthritis, osteoporosis.   SURGERIES:  Hysterectomy, rectocele repair, breast biopsies, total right  knee.   ALLERGIES:  SULFA, CODEINE, PLAVIX, CORTISONE.   MEDICATIONS:  1. Valium.  2. Prednisone.  3. Cardizem CD.  4. Synthroid.  5. Aspirin.  6. Tofranil.  7. Caltrate.  8. Hydrocodone.  9. Centrum multivitamin.  10.      Nexium.  11.      Voltaren XR.  12.      Osteo.  13.      Miflex.  14.      Lisinopril/ HCTZ.  15.      Miacalcin.   SOCIAL HISTORY:  She does not smoke.  She does not drink alcohol.   REVIEW OF SYSTEMS:  Denies anginal chest pains.  Shortness of breath and  weakness recently.  GI history as above.   PHYSICAL EXAMINATION:  GENERAL:  She does not appear in any acute distress.  She is nonicteric.  NECK:  Supple.  HEART:  Regular rhythm, no murmurs are heard.  LUNGS:  Clear.  ABDOMEN:  Bowel sounds are normal, soft, nontender, no hepatosplenomegaly.   IMPRESSION:  Anemia of uncertain etiology.  It is possible that the  patient's anemia is  secondary to slow GI blood loss from the GI tract.  She  has been Voltaren, aspirin and prednisone for her rheumatoid arthritis.   PLAN:  I will schedule her for an EGD to evaluate the upper GI tract and  make sure there is not an abnormality in the upper GI tract which could  explain her anemia.  The patient has had colonoscopies in the past, and at  the present time, I think it would be of more yield to evaluate her upper GI  tract in view of the medications that she has been on to rule out a source  for anemia.                                               Graylin Shiver, M.D.    SFG/MEDQ  D:  09/08/2003  T:  09/10/2003  Job:  [id number]   cc:   Loraine Leriche A. Waynard Edwards, M.D.  [address]  [place]  [place] [address]  Fax: [phone]   Everardo All. Madilyn Fireman, M.D.  [address]  [place]  [place] [address]  Fax: [phone]   Geoffry Paradise, M.D.  [address]  [place]  [place] [address]  Fax: [phone]   Areatha Keas, M.D.  [address]  [address]  [place]  [place] [address]  Fax: [phone]   Leonides Grills, M.D.  [address]  [place]  [place] [address]  Fax: [phone]   Stefani Dama, M.D.  [address]  [place]  [place] [address]  Fax: [phone]   Colleen Can. Deborah Chalk, M.D.  Fax: [phone]

## 2011-03-21 LAB — DIFFERENTIAL
Basophils Relative: 0
Basophils Relative: 1
Eosinophils Absolute: 0
Eosinophils Absolute: 0.1
Eosinophils Absolute: 0.2
Eosinophils Relative: 1
Lymphocytes Relative: 3 — ABNORMAL LOW
Lymphs Abs: 0.5 — ABNORMAL LOW
Lymphs Abs: 1.1
Monocytes Absolute: 0.8
Monocytes Absolute: 1.4 — ABNORMAL HIGH
Monocytes Relative: 8
Monocytes Relative: 8
Neutro Abs: 14.3 — ABNORMAL HIGH
Neutro Abs: 15.3 — ABNORMAL HIGH
Neutrophils Relative %: 85 — ABNORMAL HIGH
Neutrophils Relative %: 94 — ABNORMAL HIGH

## 2011-03-21 LAB — CROSSMATCH
ABO/RH(D): O NEG
ABO/RH(D): O NEG
Antibody Screen: NEGATIVE

## 2011-03-21 LAB — CBC
HCT: 27 — ABNORMAL LOW
HCT: 27.9 — ABNORMAL LOW
HCT: 29.9 — ABNORMAL LOW
Hemoglobin: 11.8 — ABNORMAL LOW
Hemoglobin: 8.1 — ABNORMAL LOW
Hemoglobin: 9.1 — ABNORMAL LOW
MCHC: 33.7
MCHC: 33.8
MCHC: 33.8
MCHC: 34
MCV: 88.3
MCV: 88.7
MCV: 89.8
MCV: 89.8
MCV: 90.2
Platelets: 196
Platelets: 205
Platelets: 211
Platelets: 218
Platelets: 251
Platelets: 389
RBC: 2.68 — ABNORMAL LOW
RBC: 2.99 — ABNORMAL LOW
RBC: 3.11 — ABNORMAL LOW
RBC: 3.31 — ABNORMAL LOW
RDW: 14.1
RDW: 14.3
RDW: 14.4
WBC: 14.6 — ABNORMAL HIGH
WBC: 15.3 — ABNORMAL HIGH
WBC: 17.4 — ABNORMAL HIGH
WBC: 18.1 — ABNORMAL HIGH
WBC: 19.3 — ABNORMAL HIGH
WBC: 20.5 — ABNORMAL HIGH

## 2011-03-21 LAB — BASIC METABOLIC PANEL
BUN: 16
BUN: 17
BUN: 40 — ABNORMAL HIGH
BUN: 45 — ABNORMAL HIGH
CO2: 27
CO2: 28
Calcium: 8.4
Calcium: 8.7
Chloride: 97
Chloride: 97
Chloride: 99
Creatinine, Ser: 0.85
Creatinine, Ser: 1.05
Creatinine, Ser: 1.16
Creatinine, Ser: 1.28 — ABNORMAL HIGH
GFR calc Af Amer: 55 — ABNORMAL LOW
GFR calc Af Amer: >60
GFR calc Af Amer: >60
GFR calc Af Amer: >60
GFR calc non Af Amer: 46 — ABNORMAL LOW
GFR calc non Af Amer: 52 — ABNORMAL LOW
GFR calc non Af Amer: 57 — ABNORMAL LOW
GFR calc non Af Amer: >60
Glucose, Bld: 109 — ABNORMAL HIGH
Glucose, Bld: 134 — ABNORMAL HIGH
Potassium: 3.4 — ABNORMAL LOW
Potassium: 3.9
Potassium: 4.2
Sodium: 133 — ABNORMAL LOW

## 2011-03-21 LAB — URINE CULTURE
Colony Count: >=100000
Colony Count: NO GROWTH
Culture: NO GROWTH

## 2011-03-21 LAB — COMPREHENSIVE METABOLIC PANEL
ALT: 18
Albumin: 3.8
Alkaline Phosphatase: 70
Potassium: 4.2
Sodium: 137
Total Protein: 6.3

## 2011-03-21 LAB — PROTIME-INR
INR: 1.6 — ABNORMAL HIGH
INR: 2.4 — ABNORMAL HIGH
INR: 4.3 — ABNORMAL HIGH
INR: 5 — ABNORMAL HIGH
Prothrombin Time: 19.7 — ABNORMAL HIGH
Prothrombin Time: 22.7 — ABNORMAL HIGH
Prothrombin Time: 27.3 — ABNORMAL HIGH
Prothrombin Time: 42.8 — ABNORMAL HIGH

## 2011-03-21 LAB — URINALYSIS, ROUTINE W REFLEX MICROSCOPIC
Glucose, UA: NEGATIVE
Hgb urine dipstick: NEGATIVE
Protein, ur: 30 — AB
Protein, ur: NEGATIVE
Urobilinogen, UA: 0.2
pH: 7

## 2011-03-21 LAB — ABO/RH: ABO/RH(D): O NEG

## 2011-03-21 LAB — URINE MICROSCOPIC-ADD ON

## 2011-03-24 LAB — COMPREHENSIVE METABOLIC PANEL
ALT: 301 — ABNORMAL HIGH
AST: 147 — ABNORMAL HIGH
AST: 375 — ABNORMAL HIGH
Albumin: 2 — ABNORMAL LOW
Alkaline Phosphatase: 110
Alkaline Phosphatase: 113
BUN: 41 — ABNORMAL HIGH
CO2: 24
CO2: 28
Calcium: 8.7
Chloride: 107
Creatinine, Ser: 0.95
Creatinine, Ser: 1.01
GFR calc Af Amer: >60
GFR calc Af Amer: >60
GFR calc non Af Amer: 53 — ABNORMAL LOW
Glucose, Bld: 161 — ABNORMAL HIGH
Potassium: 4.1
Potassium: 4.7
Total Bilirubin: 0.7
Total Bilirubin: 0.7
Total Protein: 5.9 — ABNORMAL LOW

## 2011-03-24 LAB — CBC
HCT: 29.4 — ABNORMAL LOW
Hemoglobin: 10.1 — ABNORMAL LOW
MCHC: 34.1
MCV: 88.7
MCV: 89.5
Platelets: 379
Platelets: 384
RBC: 3.15 — ABNORMAL LOW
RBC: 4.59
RDW: 14.8
RDW: 15.2
WBC: 14.4 — ABNORMAL HIGH

## 2011-03-24 LAB — DIFFERENTIAL
Basophils Absolute: 0
Basophils Absolute: 0.1
Basophils Relative: 0
Basophils Relative: 0
Eosinophils Absolute: 0
Eosinophils Relative: 0
Lymphocytes Relative: 4 — ABNORMAL LOW
Monocytes Absolute: 0.9
Monocytes Relative: 3
Neutro Abs: 17.7 — ABNORMAL HIGH
Neutrophils Relative %: 93 — ABNORMAL HIGH

## 2011-03-24 LAB — PROTIME-INR
INR: 4.2 — ABNORMAL HIGH
Prothrombin Time: 23.6 — ABNORMAL HIGH

## 2011-03-26 LAB — URINALYSIS, ROUTINE W REFLEX MICROSCOPIC
Bilirubin Urine: NEGATIVE
Glucose, UA: NEGATIVE
Hgb urine dipstick: NEGATIVE
Ketones, ur: NEGATIVE
Nitrite: NEGATIVE
Protein, ur: NEGATIVE
Specific Gravity, Urine: 1.024
Urobilinogen, UA: 0.2
pH: 5.5

## 2011-03-26 LAB — CBC
HCT: 36.1
Hemoglobin: 12.4
MCHC: 34.3
MCV: 90.4
Platelets: 266
RBC: 3.99
RDW: 14.7
WBC: 11.9 — ABNORMAL HIGH

## 2011-03-26 LAB — DIFFERENTIAL
Basophils Absolute: 0
Basophils Relative: 0
Eosinophils Absolute: 0.1
Eosinophils Relative: 1
Lymphocytes Relative: 10 — ABNORMAL LOW
Lymphs Abs: 1.2
Monocytes Absolute: 0.8
Monocytes Relative: 7
Neutro Abs: 9.7 — ABNORMAL HIGH
Neutrophils Relative %: 82 — ABNORMAL HIGH

## 2011-03-26 LAB — POCT CARDIAC MARKERS
CKMB, poc: 1.7
CKMB, poc: 2.1
Myoglobin, poc: 86
Myoglobin, poc: 95.9
Operator id: 151321
Operator id: 151321
Troponin i, poc: <0.05
Troponin i, poc: <0.05

## 2011-03-26 LAB — URINE CULTURE: Colony Count: 15000

## 2011-03-26 LAB — BASIC METABOLIC PANEL WITH GFR
BUN: 26 — ABNORMAL HIGH
CO2: 26
GFR calc non Af Amer: 40 — ABNORMAL LOW
Glucose, Bld: 115 — ABNORMAL HIGH
Potassium: 4
Sodium: 138

## 2011-03-26 LAB — URINE MICROSCOPIC-ADD ON

## 2011-03-26 LAB — BASIC METABOLIC PANEL
Calcium: 9.7
Chloride: 103
Creatinine, Ser: 1.3 — ABNORMAL HIGH
GFR calc Af Amer: 48 — ABNORMAL LOW

## 2011-03-26 LAB — SAMPLE TO BLOOD BANK

## 2011-03-26 LAB — B-NATRIURETIC PEPTIDE (CONVERTED LAB): Pro B Natriuretic peptide (BNP): <30

## 2011-04-14 LAB — COMPREHENSIVE METABOLIC PANEL
ALT: 10
ALT: 14
ALT: 20
AST: 14
AST: 14
AST: 15
Albumin: 2 — ABNORMAL LOW
Albumin: 2.2 — ABNORMAL LOW
Albumin: 2.6 — ABNORMAL LOW
Alkaline Phosphatase: 64
Alkaline Phosphatase: 70
CO2: 24
CO2: 25
Calcium: 8.2 — ABNORMAL LOW
Calcium: 8.5
Chloride: 106
Chloride: 106
Creatinine, Ser: 1.13
GFR calc Af Amer: 37 — ABNORMAL LOW
GFR calc Af Amer: 57 — ABNORMAL LOW
GFR calc Af Amer: 59 — ABNORMAL LOW
GFR calc non Af Amer: 30 — ABNORMAL LOW
GFR calc non Af Amer: 47 — ABNORMAL LOW
GFR calc non Af Amer: 49 — ABNORMAL LOW
Potassium: 3.2 — ABNORMAL LOW
Potassium: 4.1
Sodium: 135
Sodium: 138
Sodium: 138
Total Bilirubin: 0.4
Total Protein: 5.1 — ABNORMAL LOW
Total Protein: 5.2 — ABNORMAL LOW

## 2011-04-14 LAB — CULTURE, BLOOD (ROUTINE X 2)
Culture: NO GROWTH
Culture: NO GROWTH

## 2011-04-14 LAB — CBC
HCT: 31.2 — ABNORMAL LOW
MCHC: 33.5
MCHC: 33.5
MCHC: 34.4
MCV: 92.2
MCV: 92.6
Platelets: 175
Platelets: 185
Platelets: 222
Platelets: 262
RBC: 3.1 — ABNORMAL LOW
RBC: 3.39 — ABNORMAL LOW
RBC: 3.59 — ABNORMAL LOW
RDW: 14
RDW: 14.2 — ABNORMAL HIGH
WBC: 11.4 — ABNORMAL HIGH
WBC: 8.7

## 2011-04-14 LAB — DIFFERENTIAL
Basophils Absolute: 0
Basophils Relative: 0
Basophils Relative: 0
Eosinophils Absolute: 0
Eosinophils Absolute: 0
Eosinophils Absolute: 0
Eosinophils Absolute: 0.1
Eosinophils Relative: 0
Eosinophils Relative: 0
Eosinophils Relative: 1
Lymphocytes Relative: 5 — ABNORMAL LOW
Lymphs Abs: 0.4 — ABNORMAL LOW
Lymphs Abs: 0.8
Monocytes Absolute: 0.1 — ABNORMAL LOW
Monocytes Absolute: 0.5
Monocytes Absolute: 1.3 — ABNORMAL HIGH
Monocytes Relative: 4
Monocytes Relative: 4
Neutro Abs: 12.3 — ABNORMAL HIGH

## 2011-04-14 LAB — MAGNESIUM
Magnesium: 1.4 — ABNORMAL LOW
Magnesium: 2.2

## 2011-04-14 LAB — URINALYSIS, ROUTINE W REFLEX MICROSCOPIC
Protein, ur: NEGATIVE
Urobilinogen, UA: 0.2

## 2011-04-14 LAB — TYPE AND SCREEN
ABO/RH(D): O NEG
Antibody Screen: NEGATIVE

## 2011-04-14 LAB — PHOSPHORUS: Phosphorus: 3.9

## 2011-04-14 LAB — PROTIME-INR: Prothrombin Time: 14.1

## 2011-04-14 LAB — ABO/RH: ABO/RH(D): O NEG

## 2011-04-14 LAB — URINE CULTURE

## 2011-05-10 ENCOUNTER — Encounter (HOSPITAL_COMMUNITY): Payer: Self-pay | Admitting: *Deleted

## 2011-05-10 ENCOUNTER — Emergency Department (HOSPITAL_COMMUNITY): Payer: Medicare Other

## 2011-05-10 ENCOUNTER — Other Ambulatory Visit: Payer: Self-pay

## 2011-05-10 ENCOUNTER — Inpatient Hospital Stay (HOSPITAL_COMMUNITY)
Admission: EM | Admit: 2011-05-10 | Discharge: 2011-05-12 | DRG: 552 | Disposition: A | Discharge status: Home / Self Care | Payer: Medicare Other | Attending: Interventional Cardiology | Admitting: Interventional Cardiology

## 2011-05-10 DIAGNOSIS — E860 Dehydration: Secondary | ICD-10-CM

## 2011-05-10 DIAGNOSIS — R079 Chest pain, unspecified: Secondary | ICD-10-CM | POA: Diagnosis present

## 2011-05-10 DIAGNOSIS — K219 Gastro-esophageal reflux disease without esophagitis: Secondary | ICD-10-CM | POA: Diagnosis present

## 2011-05-10 DIAGNOSIS — I2510 Atherosclerotic heart disease of native coronary artery without angina pectoris: Secondary | ICD-10-CM | POA: Diagnosis present

## 2011-05-10 DIAGNOSIS — Z8711 Personal history of peptic ulcer disease: Secondary | ICD-10-CM

## 2011-05-10 DIAGNOSIS — I200 Unstable angina: Secondary | ICD-10-CM

## 2011-05-10 DIAGNOSIS — I252 Old myocardial infarction: Secondary | ICD-10-CM

## 2011-05-10 DIAGNOSIS — M549 Dorsalgia, unspecified: Principal | ICD-10-CM | POA: Diagnosis present

## 2011-05-10 DIAGNOSIS — R0602 Shortness of breath: Secondary | ICD-10-CM | POA: Diagnosis present

## 2011-05-10 HISTORY — DX: Atherosclerotic heart disease of native coronary artery without angina pectoris: I25.10

## 2011-05-10 LAB — CBC
HCT: 34 % — ABNORMAL LOW (ref 36.0–46.0)
MCH: 30 pg (ref 26.0–34.0)
MCHC: 33.5 g/dL (ref 30.0–36.0)
RDW: 14 % (ref 11.5–15.5)

## 2011-05-10 LAB — BASIC METABOLIC PANEL
BUN: 30 mg/dL — ABNORMAL HIGH (ref 6–23)
Chloride: 102 mEq/L (ref 96–112)
Creatinine, Ser: 1.27 mg/dL — ABNORMAL HIGH (ref 0.50–1.10)
GFR calc Af Amer: 45 mL/min — ABNORMAL LOW (ref >90)
Glucose, Bld: 162 mg/dL — ABNORMAL HIGH (ref 70–99)

## 2011-05-10 LAB — DIFFERENTIAL
Basophils Absolute: 0.1 10*3/uL (ref 0.0–0.1)
Basophils Relative: 1 % (ref 0–1)
Eosinophils Absolute: 0.2 10*3/uL (ref 0.0–0.7)
Eosinophils Relative: 2 % (ref 0–5)
Monocytes Absolute: 0.8 10*3/uL (ref 0.1–1.0)
Neutro Abs: 8.7 10*3/uL — ABNORMAL HIGH (ref 1.7–7.7)

## 2011-05-10 MED ORDER — ASPIRIN 81 MG PO CHEW
324.0000 mg | CHEWABLE_TABLET | Freq: Once | ORAL | Status: AC
  Administered 2011-05-10: 324 mg via ORAL
  Filled 2011-05-10: qty 4

## 2011-05-10 NOTE — ED Provider Notes (Signed)
History     CSN: [id number] Arrival date & time: 05/10/2011  6:08 PM   First MD Initiated Contact with Patient 05/10/11 1848      Chief Complaint  Patient presents with  . Back Pain    upper  . Shortness of Breath    Patient is a 77 y.o. female presenting with back pain. The history is provided by the patient and a relative.  Back Pain  This is a new problem. The current episode started more than 2 days ago. The problem occurs hourly. The problem has been gradually improving. The pain is associated with no known injury. The pain is present in the thoracic spine. Quality: pressure. Radiates to: axilla. The pain is moderate. Associated symptoms comments: Weakness and shortness of breath . Treatments tried: aspirin.  Pt reports several days of upper back pain, shortness of breath.  Pain is not sharp.  Somewhat worse with position, but she feels fatigued while walking.  No syncope.  No diaphoresis.  H/o MI last yr and reports she only had jaw pain  Past Medical History  Diagnosis Date  . SOB (shortness of breath)   . MI (myocardial infarction)   . Poor appetite   . Arthritis   . Fatigue   . Right ear pain   . Renal insufficiency   . RA (rheumatoid arthritis)   . PUD (peptic ulcer disease)   . GI bleed 2005  . GERD (gastroesophageal reflux disease)   . Hiatal hernia   . Hypothyroidism   . Osteoporosis   . Depression   . Anxiety   . Coronary artery disease     Past Surgical History  Procedure Date  . Cardiac catheterization     SHOWED RUPTURE PLAQUE IN THE LAD. THE LAD IS NONOBSTRUCTIVE WITH ONLY 30-40% STENOSIS  . Nstemi 06/2010  . Spinal fusion surgery   . Knee surgery     No family history on file.  History  Substance Use Topics  . Smoking status: Never Smoker   . Smokeless tobacco: Never Used  . Alcohol Use: No    OB History    Grav Para Term Preterm Abortions TAB SAB Ect Mult Living                  Review of Systems  Musculoskeletal: Positive for back  pain.  All other systems reviewed and are negative.    Allergies  Codeine; Percocet; Plavix; and Sulfur  Home Medications   Current Outpatient Rx  Name Route Sig Dispense Refill  . ACETAMINOPHEN 500 MG PO TABS Oral Take 500 mg by mouth every 6 (six) hours as needed. For pain    . VITAMIN C PO Oral Take 500 mg by mouth daily.     . ASPIRIN 81 MG PO TABS Oral Take 81 mg by mouth daily.      Marland Kitchen CALTRATE 600 PO Oral Take 1 tablet by mouth 2 (two) times daily.      Marland Kitchen VITAMIN D PO Oral Take by mouth daily.      Marland Kitchen DIAZEPAM 5 MG PO TABS Oral Take 5 mg by mouth at bedtime.      Marland Kitchen ESOMEPRAZOLE MAGNESIUM 40 MG PO CPDR Oral Take 40 mg by mouth daily before breakfast.      . OMEGA-3 FATTY ACIDS 1000 MG PO CAPS Oral Take 1 g by mouth daily.     Marland Kitchen FOLIC ACID 1 MG PO TABS Oral Take 1 mg by mouth daily.      Marland Kitchen  HYDROCODONE-ACETAMINOPHEN 5-500 MG PO TABS  1 tablet every 4 (four) hours as needed. For pain    . IMIPRAMINE HCL 50 MG PO TABS Oral Take 1 tablet by mouth Daily.    Marland Kitchen LEVOTHYROXINE SODIUM 50 MCG PO TABS Oral Take 50 mcg by mouth daily.     Marland Kitchen LISINOPRIL-HYDROCHLOROTHIAZIDE 20-12.5 MG PO TABS Oral Take 1 tablet by mouth daily.      Marland Kitchen METOPROLOL TARTRATE 50 MG PO TABS Oral Take 50 mg by mouth 2 (two) times daily.      Marland Kitchen ONE-DAILY MULTI VITAMINS PO TABS Oral Take 1 tablet by mouth daily.      Marland Kitchen PREDNISONE 5 MG PO TABS Oral Take 5 mg by mouth daily.      Marland Kitchen ROSUVASTATIN CALCIUM 20 MG PO TABS Oral Take 20 mg by mouth daily.      . SERTRALINE HCL 50 MG PO TABS Oral Take 50 mg by mouth daily.        BP 130/68  Pulse 81  Temp(Src) 97.7 F (36.5 C) (Oral)  Resp 20  SpO2 100%  Physical Exam  CONSTITUTIONAL: Well developed/well nourished HEAD AND FACE: Normocephalic/atraumatic EYES: EOMI/PERRL ENMT: Mucous membranes moist NECK: supple no meningeal signs SPINE:entire spine nontender, mild paraspinal thoracic tenderness CV: no murmurs/rubs/gallops noted LUNGS: Lungs are clear to auscultation  bilaterally, no apparent distress ABDOMEN: soft, nontender, no rebound or guarding GU:no cva tenderness NEURO: Pt is awake/alert, moves all extremitiesx4 EXTREMITIES: pulses normal, full ROM SKIN: warm, color normal PSYCH: no abnormalities of mood noted   ED Course  Procedures   Nursing notes reviewed and considered in documentation xrays reviewed and considered All labs/vitals reviewed and considered Previous records reviewed and considered   Labs Reviewed  BASIC METABOLIC PANEL - Abnormal; Notable for the following:    Glucose, Bld 162 (*)    BUN 30 (*)    Creatinine, Ser 1.27 (*)    GFR calc non Af Amer 39 (*)    GFR calc Af Amer 45 (*)    All other components within normal limits  CBC - Abnormal; Notable for the following:    WBC 11.0 (*)    RBC 3.80 (*)    Hemoglobin 11.4 (*)    HCT 34.0 (*)    All other components within normal limits  DIFFERENTIAL - Abnormal; Notable for the following:    Neutrophils Relative 79 (*)    Neutro Abs 8.7 (*)    All other components within normal limits  POCT I-STAT TROPONIN I  I-STAT TROPONIN I   Dg Chest 2 View  05/10/2011  *RADIOLOGY REPORT*  Clinical Data: Shortness of breath and bilateral posterior shoulder pain for 2 days, radiating into the neck.  CHEST - 2 VIEW  Comparison: Chest radiograph and CT of the chest performed 07/06/2010  Findings: The lungs are relatively well-aerated and clear.  There is no evidence of focal opacification, pleural effusion or pneumothorax.  The heart is normal in size; the mediastinal contour is within normal limits.  No acute osseous abnormalities are seen. The fractured right-sided thoracolumbar spinal rod is again seen; the thoracolumbar rods are stable in appearance.  A large hiatal hernia is again noted, filled with fluid and air.  IMPRESSION:  1.  No acute cardiopulmonary process seen. 2.  Large hiatal hernia again noted, filled with fluid and air. 3.  Stable appearance to fractured right-sided  thoracolumbar spinal rod.  Original Report Authenticated By: Tonia Ghent, M.D.  MDM  Pt with h/o non-stemi due to acute plaque rupture   11:17 PM Pt stable, but did report upper back pain/weakness/sob, and h/o atypical MI.  Would benefit from admission as she did not have clear injury to cause this pain.  Suspicion for dissection/PE is low D/w dr Terressa Koyanagi, cardiology, will admit   Date: 05/10/2011  Rate: 83  Rhythm: normal sinus rhythm  QRS Axis: normal  Intervals: normal  ST/T Wave abnormalities: nonspecific ST changes  Conduction Disutrbances:none  Narrative Interpretation:   Old EKG Reviewed: unchanged          Joya Gaskins, MD 05/10/11 [phone]

## 2011-05-10 NOTE — ED Notes (Signed)
Pt has been having mid upper back pain between her shoulder blades since Thursday and it wraps around under her left lung and into axilla.  Pt has pain on deep inspiration. No chest pain.  Last time she had mi it went from back to jaw.  Pt sts now difficulty getting deep breath

## 2011-05-10 NOTE — ED Notes (Signed)
Pt c/o back/neck pain intermittent since Thursday.  States with the pain she also feels SOB.  Pt reports MI in January 2012 with similar pain in her back/shoulder area.  No pain at this time.  Pt also has osteoarthritis and was difficult to move from bed to beside commode.

## 2011-05-10 NOTE — ED Notes (Signed)
Called xray, they state pt is on the list and are aware of need for 2-view chest.

## 2011-05-11 ENCOUNTER — Encounter (HOSPITAL_COMMUNITY): Payer: Self-pay | Admitting: *Deleted

## 2011-05-11 DIAGNOSIS — I200 Unstable angina: Secondary | ICD-10-CM

## 2011-05-11 LAB — BASIC METABOLIC PANEL
GFR calc Af Amer: 51 mL/min — ABNORMAL LOW (ref >90)
GFR calc non Af Amer: 44 mL/min — ABNORMAL LOW (ref >90)
Potassium: 3.7 mEq/L (ref 3.5–5.1)
Sodium: 140 mEq/L (ref 135–145)

## 2011-05-11 LAB — PROTIME-INR: INR: 1 (ref 0.00–1.49)

## 2011-05-11 LAB — CBC
MCHC: 32.8 g/dL (ref 30.0–36.0)
RDW: 14 % (ref 11.5–15.5)

## 2011-05-11 LAB — LIPID PANEL
HDL: 52 mg/dL (ref >39)
LDL Cholesterol: 50 mg/dL (ref 0–99)
Triglycerides: 292 mg/dL — ABNORMAL HIGH (ref <150)
VLDL: 58 mg/dL — ABNORMAL HIGH (ref 0–40)

## 2011-05-11 LAB — CARDIAC PANEL(CRET KIN+CKTOT+MB+TROPI)
CK, MB: 3.4 ng/mL (ref 0.3–4.0)
CK, MB: 2.9 ng/mL (ref 0.3–4.0)
CK, MB: 2.9 ng/mL (ref 0.3–4.0)
Total CK: 52 U/L (ref 7–177)
Troponin I: <0.3 ng/mL (ref <0.30)

## 2011-05-11 MED ORDER — SERTRALINE HCL 50 MG PO TABS
50.0000 mg | ORAL_TABLET | Freq: Once | ORAL | Status: AC
  Administered 2011-05-11: 50 mg via ORAL
  Filled 2011-05-11: qty 1

## 2011-05-11 MED ORDER — SERTRALINE HCL 50 MG PO TABS
50.0000 mg | ORAL_TABLET | Freq: Every day | ORAL | Status: DC
  Filled 2011-05-11: qty 1

## 2011-05-11 MED ORDER — ROSUVASTATIN CALCIUM 20 MG PO TABS
20.0000 mg | ORAL_TABLET | Freq: Every day | ORAL | Status: DC
  Filled 2011-05-11: qty 1

## 2011-05-11 MED ORDER — IMIPRAMINE HCL 50 MG PO TABS
50.0000 mg | ORAL_TABLET | Freq: Every day | ORAL | Status: DC
  Administered 2011-05-11: 50 mg via ORAL
  Filled 2011-05-11 (×2): qty 1

## 2011-05-11 MED ORDER — HEPARIN SODIUM (PORCINE) 5000 UNIT/ML IJ SOLN
5000.0000 [IU] | Freq: Three times a day (TID) | INTRAMUSCULAR | Status: DC
  Administered 2011-05-11 – 2011-05-12 (×5): 5000 [IU] via SUBCUTANEOUS
  Filled 2011-05-11 (×7): qty 1

## 2011-05-11 MED ORDER — ACETAMINOPHEN 500 MG PO TABS
500.0000 mg | ORAL_TABLET | Freq: Four times a day (QID) | ORAL | Status: DC | PRN
  Administered 2011-05-11: 500 mg via ORAL
  Filled 2011-05-11: qty 1

## 2011-05-11 MED ORDER — OMEGA-3-ACID ETHYL ESTERS 1 G PO CAPS
1.0000 g | ORAL_CAPSULE | Freq: Every day | ORAL | Status: DC
  Administered 2011-05-11 – 2011-05-12 (×2): 1 g via ORAL
  Filled 2011-05-11 (×2): qty 1

## 2011-05-11 MED ORDER — PREDNISONE 5 MG PO TABS
5.0000 mg | ORAL_TABLET | Freq: Every day | ORAL | Status: DC
  Administered 2011-05-11 – 2011-05-12 (×2): 5 mg via ORAL
  Filled 2011-05-11 (×2): qty 1

## 2011-05-11 MED ORDER — LISINOPRIL 20 MG PO TABS
20.0000 mg | ORAL_TABLET | Freq: Every day | ORAL | Status: DC
  Administered 2011-05-11 – 2011-05-12 (×2): 20 mg via ORAL
  Filled 2011-05-11 (×2): qty 1

## 2011-05-11 MED ORDER — ROSUVASTATIN CALCIUM 20 MG PO TABS
20.0000 mg | ORAL_TABLET | Freq: Every day | ORAL | Status: DC
  Administered 2011-05-11: 20 mg via ORAL
  Filled 2011-05-11: qty 1

## 2011-05-11 MED ORDER — SODIUM CHLORIDE 0.9 % IJ SOLN
3.0000 mL | Freq: Two times a day (BID) | INTRAMUSCULAR | Status: DC
  Administered 2011-05-11 – 2011-05-12 (×4): 3 mL via INTRAVENOUS

## 2011-05-11 MED ORDER — ASPIRIN 300 MG RE SUPP
300.0000 mg | RECTAL | Status: AC
  Filled 2011-05-11: qty 1

## 2011-05-11 MED ORDER — LEVOTHYROXINE SODIUM 50 MCG PO TABS
50.0000 ug | ORAL_TABLET | Freq: Every day | ORAL | Status: DC
  Administered 2011-05-11: 50 ug via ORAL
  Filled 2011-05-11 (×3): qty 1

## 2011-05-11 MED ORDER — ASPIRIN 81 MG PO CHEW: 81.0000 mg | CHEWABLE_TABLET | Freq: Every day | ORAL | Status: DC

## 2011-05-11 MED ORDER — HYDROCHLOROTHIAZIDE 12.5 MG PO CAPS
12.5000 mg | ORAL_CAPSULE | Freq: Every day | ORAL | Status: DC
  Administered 2011-05-11 – 2011-05-12 (×2): 12.5 mg via ORAL
  Filled 2011-05-11 (×2): qty 1

## 2011-05-11 MED ORDER — NITROGLYCERIN 0.4 MG SL SUBL: 0.4000 mg | SUBLINGUAL_TABLET | SUBLINGUAL | Status: DC | PRN

## 2011-05-11 MED ORDER — PANTOPRAZOLE SODIUM 40 MG PO TBEC
40.0000 mg | DELAYED_RELEASE_TABLET | Freq: Every day | ORAL | Status: DC
  Administered 2011-05-11 – 2011-05-12 (×2): 40 mg via ORAL
  Filled 2011-05-11 (×2): qty 1

## 2011-05-11 MED ORDER — SODIUM CHLORIDE 0.9 % IV SOLN
INTRAVENOUS | Status: AC
  Administered 2011-05-11: 05:00:00 via INTRAVENOUS

## 2011-05-11 MED ORDER — LISINOPRIL-HYDROCHLOROTHIAZIDE 20-12.5 MG PO TABS: 1.0000 | ORAL_TABLET | Freq: Every day | ORAL | Status: DC

## 2011-05-11 MED ORDER — ONDANSETRON HCL 4 MG/2ML IJ SOLN: 4.0000 mg | Freq: Four times a day (QID) | INTRAMUSCULAR | Status: DC | PRN

## 2011-05-11 MED ORDER — ASPIRIN EC 81 MG PO TBEC
81.0000 mg | DELAYED_RELEASE_TABLET | Freq: Every day | ORAL | Status: DC
  Administered 2011-05-12: 81 mg via ORAL
  Filled 2011-05-11: qty 1

## 2011-05-11 MED ORDER — DIAZEPAM 5 MG PO TABS
5.0000 mg | ORAL_TABLET | Freq: Every day | ORAL | Status: DC
  Administered 2011-05-11: 5 mg via ORAL
  Filled 2011-05-11: qty 1

## 2011-05-11 MED ORDER — FOLIC ACID 1 MG PO TABS
1.0000 mg | ORAL_TABLET | Freq: Every day | ORAL | Status: DC
  Administered 2011-05-11 – 2011-05-12 (×2): 1 mg via ORAL
  Filled 2011-05-11 (×2): qty 1

## 2011-05-11 MED ORDER — THERA M PLUS PO TABS
1.0000 | ORAL_TABLET | Freq: Every day | ORAL | Status: DC
  Administered 2011-05-11 – 2011-05-12 (×2): 1 via ORAL
  Filled 2011-05-11 (×2): qty 1

## 2011-05-11 MED ORDER — SODIUM CHLORIDE 0.9 % IJ SOLN: 3.0000 mL | INTRAMUSCULAR | Status: DC | PRN

## 2011-05-11 MED ORDER — METOPROLOL TARTRATE 50 MG PO TABS
50.0000 mg | ORAL_TABLET | Freq: Two times a day (BID) | ORAL | Status: DC
  Administered 2011-05-11 – 2011-05-12 (×3): 50 mg via ORAL
  Filled 2011-05-11 (×4): qty 1

## 2011-05-11 NOTE — ED Notes (Signed)
Attempted to call report x 1.  Given name and number and nurse to call back.

## 2011-05-11 NOTE — Progress Notes (Signed)
Patient Name: [name] Date of Encounter: 05/11/2011    SUBJECTIVE: For 5 days the patient has had recurring interscapular discomfort. There is no exertional component. It feels similar to the discomfort that she had piperonyl and ST elevation myocardial infarction. Since admission to the hospital she has had recurrences of chest pain while lying in bed. Nitroglycerin has not been tried. There is no associated dyspnea.  TELEMETRY: No significant arrhythmias noted appear: Filed Vitals:   05/11/11 0700 05/11/11 0753 05/11/11 0800 05/11/11 0941  BP: 164/77  141/65 131/59  Pulse: 88  82   Temp:  97.3 F (36.3 C)    TempSrc:  Oral    Resp:   17   Height:      Weight:      SpO2: 100%  96%     Intake/Output Summary (Last 24 hours) at 05/11/11 1150 Last data filed at 05/11/11 0600  Gross per 24 hour  Intake    150 ml  Output    150 ml  Net      0 ml    LABS: Basic Metabolic Panel:  Basename 05/11/11 0417 05/10/11 1900  NA 140 138  K 3.7 4.2  CL 104 102  CO2 25 25  GLUCOSE 123* 162*  BUN 28* 30*  CREATININE 1.16* 1.27*  CALCIUM 9.4 9.7  MG -- --  PHOS -- --   CBC:  Basename 05/11/11 0417 05/10/11 1900  WBC 9.5 11.0*  NEUTROABS -- 8.7*  HGB 11.0* 11.4*  HCT 33.5* 34.0*  MCV 88.9 89.5  PLT 172 180   Cardiac Enzymes:  Basename 05/11/11 1050 05/11/11 0414  CKTOTAL 44 51  CKMB 2.9 3.4  CKMBINDEX -- --  TROPONINI <0.30 <0.30     Basename 05/11/11 0414  CHOL 160  HDL 52  LDLCALC 50  TRIG 292*  CHOLHDL 3.1  LDLDIRECT --      Physical Exam: Patient is lying comfortably in bed. Her neck veins not distended. The lungs are clear the auscultation and percussion. The cardiac exam reveals no S4 gallop no murmur. Abdomen soft. The extremities reveal no edema. Neuro exam is unremarkable.  ASSESSMENT :  1. Recurrent back discomfort similar to prior chest pain/symptom associated with non-ST elevation myocardial infarction.  2. Peptic ulcer disease.  3.  Gastroesophageal reflux disease  Plan:  1. Pharmacologic stress test in a.m.  2. Review prior images.  3. Up and ambulating today. Selinda Eon 05/11/2011, 11:50 AM

## 2011-05-11 NOTE — H&P (Signed)
[name] is an 77 y.o. female.   Chief Complaint: chest pain HPI:  80F who presents to the ED with chest pain.  Pain began on Thursday.  She describes her pain as similar to prior NSTEMI with left sided chest pain with radiation to neck, upper back.  Pain has been off and on and worsens with activity and relieved with rest.  She has not taken an NTG.  She has associated shortness of breath.  She rated her pain 5-6/10.  Past Medical History  Diagnosis Date  . SOB (shortness of breath)   . MI (myocardial infarction)   . Poor appetite   . Arthritis   . Fatigue   . Right ear pain   . Renal insufficiency   . RA (rheumatoid arthritis)   . PUD (peptic ulcer disease)   . GI bleed 2005  . GERD (gastroesophageal reflux disease)   . Hiatal hernia   . Hypothyroidism   . Osteoporosis   . Depression   . Anxiety   . Coronary artery disease     Past Surgical History  Procedure Date  . Cardiac catheterization     SHOWED RUPTURE PLAQUE IN THE LAD. THE LAD IS NONOBSTRUCTIVE WITH ONLY 30-40% STENOSIS  . Nstemi 06/2010  . Spinal fusion surgery   . Knee surgery     No family history on file. Social History:  reports that she has never smoked. She has never used smokeless tobacco. She reports that she does not drink alcohol or use illicit drugs.  Allergies:  Allergies  Allergen Reactions  . Codeine   . Percocet (Oxycodone-Acetaminophen)   . Plavix (Clopidogrel Bisulfate)   . Sulfur     Medications Prior to Admission  Medication Dose Route Frequency Provider Last Rate Last Dose  . aspirin chewable tablet 324 mg  324 mg Oral Once Joya Gaskins, MD   324 mg at 05/10/11 2056   Medications Prior to Admission  Medication Sig Dispense Refill  . acetaminophen (TYLENOL) 500 MG tablet Take 500 mg by mouth every 6 (six) hours as needed. For pain      . Ascorbic Acid (VITAMIN C PO) Take 500 mg by mouth daily.       Marland Kitchen aspirin 81 MG tablet Take 81 mg by mouth daily.        .  Cholecalciferol (VITAMIN D PO) Take by mouth daily.        . diazepam (VALIUM) 5 MG tablet Take 5 mg by mouth at bedtime.        Marland Kitchen esomeprazole (NEXIUM) 40 MG capsule Take 40 mg by mouth daily before breakfast.        . fish oil-omega-3 fatty acids 1000 MG capsule Take 1 g by mouth daily.       . folic acid (FOLVITE) 1 MG tablet Take 1 mg by mouth daily.        Marland Kitchen HYDROcodone-acetaminophen (VICODIN) 5-500 MG per tablet 1 tablet every 4 (four) hours as needed. For pain      . imipramine (TOFRANIL) 50 MG tablet Take 1 tablet by mouth Daily.      Marland Kitchen levothyroxine (SYNTHROID, LEVOTHROID) 50 MCG tablet Take 50 mcg by mouth daily.       Marland Kitchen lisinopril-hydrochlorothiazide (PRINZIDE,ZESTORETIC) 20-12.5 MG per tablet Take 1 tablet by mouth daily.        . metoprolol (LOPRESSOR) 50 MG tablet Take 50 mg by mouth 2 (two) times daily.        Marland Kitchen  Multiple Vitamin (MULTIVITAMIN) tablet Take 1 tablet by mouth daily.        . predniSONE (DELTASONE) 5 MG tablet Take 5 mg by mouth daily.        . rosuvastatin (CRESTOR) 20 MG tablet Take 20 mg by mouth daily.        . sertraline (ZOLOFT) 50 MG tablet Take 50 mg by mouth daily.          Results for orders placed during the hospital encounter of 05/10/11 (from the past 48 hour(s))  BASIC METABOLIC PANEL     Status: Abnormal   Collection Time   05/10/11  7:00 PM      Component Value Range Comment   Sodium 138  135 - 145 (mEq/L)    Potassium 4.2  3.5 - 5.1 (mEq/L)    Chloride 102  96 - 112 (mEq/L)    CO2 25  19 - 32 (mEq/L)    Glucose, Bld 162 (*) 70 - 99 (mg/dL)    BUN 30 (*) 6 - 23 (mg/dL)    Creatinine, Ser 1.91 (*) 0.50 - 1.10 (mg/dL)    Calcium 9.7  8.4 - 10.5 (mg/dL)    GFR calc non Af Amer 39 (*) >90 (mL/min)    GFR calc Af Amer 45 (*) >90 (mL/min)   CBC     Status: Abnormal   Collection Time   05/10/11  7:00 PM      Component Value Range Comment   WBC 11.0 (*) 4.0 - 10.5 (K/uL)    RBC 3.80 (*) 3.87 - 5.11 (MIL/uL)    Hemoglobin 11.4 (*) 12.0 - 15.0  (g/dL)    HCT 47.8 (*) 29.5 - 46.0 (%)    MCV 89.5  78.0 - 100.0 (fL)    MCH 30.0  26.0 - 34.0 (pg)    MCHC 33.5  30.0 - 36.0 (g/dL)    RDW 62.1  30.8 - 65.7 (%)    Platelets 180  150 - 400 (K/uL)   DIFFERENTIAL     Status: Abnormal   Collection Time   05/10/11  7:00 PM      Component Value Range Comment   Neutrophils Relative 79 (*) 43 - 77 (%)    Neutro Abs 8.7 (*) 1.7 - 7.7 (K/uL)    Lymphocytes Relative 12  12 - 46 (%)    Lymphs Abs 1.3  0.7 - 4.0 (K/uL)    Monocytes Relative 7  3 - 12 (%)    Monocytes Absolute 0.8  0.1 - 1.0 (K/uL)    Eosinophils Relative 2  0 - 5 (%)    Eosinophils Absolute 0.2  0.0 - 0.7 (K/uL)    Basophils Relative 1  0 - 1 (%)    Basophils Absolute 0.1  0.0 - 0.1 (K/uL)   POCT I-STAT TROPONIN I     Status: Normal   Collection Time   05/10/11 10:38 PM      Component Value Range Comment   Troponin i, poc 0.01  0.00 - 0.08 (ng/mL)    Comment 3             Dg Chest 2 View  05/10/2011  *RADIOLOGY REPORT*  Clinical Data: Shortness of breath and bilateral posterior shoulder pain for 2 days, radiating into the neck.  CHEST - 2 VIEW  Comparison: Chest radiograph and CT of the chest performed 07/06/2010  Findings: The lungs are relatively well-aerated and clear.  There is no evidence of focal opacification, pleural effusion or  pneumothorax.  The heart is normal in size; the mediastinal contour is within normal limits.  No acute osseous abnormalities are seen. The fractured right-sided thoracolumbar spinal rod is again seen; the thoracolumbar rods are stable in appearance.  A large hiatal hernia is again noted, filled with fluid and air.  IMPRESSION:  1.  No acute cardiopulmonary process seen. 2.  Large hiatal hernia again noted, filled with fluid and air. 3.  Stable appearance to fractured right-sided thoracolumbar spinal rod.  Original Report Authenticated By: Tonia Ghent, M.D.    ROS All other review of systems negative other than stated in HPI  Blood pressure  153/91, pulse 81, temperature 97.8 F (36.6 C), temperature source Oral, resp. rate 14, SpO2 100.00%. Physical Exam  BP 153/91  Pulse 81  Temp(Src) 97.8 F (36.6 C) (Oral)  Resp 14  SpO2 100%  General Appearance:  Alert, cooperative, no distress, appears stated age  Head:  Normocephalic, without obvious abnormality, atraumatic  Eyes:  PERRL, conjunctiva/corneas clear, EOM's intact, fundi benign, both eyes  Ears:  Normal TM's and external ear canals, both ears  Nose: Nares normal, septum midline,mucosa normal, no drainage or sinus tenderness  Throat: Lips, mucosa, and tongue normal; teeth and gums normal  Neck: Supple, symmetrical, trachea midline, no adenopathy;  thyroid: not enlarged, symmetric, no tenderness/mass/nodules; no carotid bruit or JVD  Back:   Symmetric, no curvature, ROM normal, no CVA tenderness  Lungs:   Clear to auscultation bilaterally, respirations unlabored  Breasts:  No masses or tenderness  Heart:  Regular rate and rhythm, S1 and S2 normal, no murmur, rub, or gallop  Abdomen:   Soft, non-tender, bowel sounds active all four quadrants,  no masses, no organomegaly  Pelvic: Deferred  Extremities: Extremities normal, atraumatic, no cyanosis or edema  Pulses: 2+ and symmetric  Skin: Skin color, texture, turgor normal, no rashes or lesions  Lymph nodes: Cervical, supraclavicular, and axillary nodes normal  Neurologic: Normal   EKG SR HR 83, no acute ischemic changes   Assessment/Plan Unstable angina   -Admit to Cardiology, Telemetry unit -EKG prn -Serial biomarkers r/o MI -ASA/BB/Statin/ACEI/Nitro prn -check lipids, hgba1c, tsh -NPO, gentle IVFs -Further ischemic evaluation with stress vs cath depending on results studies and clinical course      Itha Kroeker C. Cielo Arias 05/11/2011, 12:36 AM

## 2011-05-11 NOTE — ED Notes (Signed)
Admitting MD in to speak with pt at this time.  

## 2011-05-12 ENCOUNTER — Inpatient Hospital Stay (HOSPITAL_COMMUNITY): Payer: Medicare Other

## 2011-05-12 ENCOUNTER — Encounter (HOSPITAL_COMMUNITY): Payer: Self-pay | Admitting: *Deleted

## 2011-05-12 LAB — D-DIMER, QUANTITATIVE: D-Dimer, Quant: 0.75 ug{FEU}/mL — ABNORMAL HIGH (ref 0.00–0.48)

## 2011-05-12 MED ORDER — TECHNETIUM TC 99M TETROFOSMIN IV KIT
30.0000 | PACK | Freq: Once | INTRAVENOUS | Status: AC | PRN
  Administered 2011-05-12: 30 via INTRAVENOUS

## 2011-05-12 MED ORDER — TECHNETIUM TC 99M TETROFOSMIN IV KIT
10.0000 | PACK | Freq: Once | INTRAVENOUS | Status: AC | PRN
  Administered 2011-05-12: 10 via INTRAVENOUS

## 2011-05-12 MED ORDER — REGADENOSON 0.4 MG/5ML IV SOLN
0.4000 mg | Freq: Once | INTRAVENOUS | Status: AC
  Administered 2011-05-12: 0.4 mg via INTRAVENOUS

## 2011-05-12 MED ORDER — IOHEXOL 350 MG/ML SOLN
80.0000 mL | Freq: Once | INTRAVENOUS | Status: AC | PRN
  Administered 2011-05-12: 80 mL via INTRAVENOUS

## 2011-05-12 NOTE — Discharge Summary (Signed)
Patient ID: [name] MRN: [id number] DOB/AGE: [birth year] 77 y.o.  Admit date: 05/10/2011 Discharge date: 05/12/2011  Primary Discharge Diagnosis infrascapular back pain  Secondary Discharge Diagnosis shortness of breath  Significant Diagnostic Studies: nuclear medicine: Lexiscan Cardiolite, CT: Chest CT angiogram with contrast  Consults: none  Hospital Course: This is a 77 year old female with a history of non-ST elevation MI in January 2012 with That time showing a ruptured plaque in the mid LAD with 30-40% stenosis. She presented to the emergency room with complaints of left-sided chest pain with radiation to the upper back between her scapula. This pain was similar to her pain with her prior non-ST elevation MI. The pain was off and on worse when she sat up and with activity and relieved with rest and lying down. She also complained of some mild shortness of breath. Of note her chest pain is pleuritic in nature. She was admitted to the hospital and ruled out for myocardial infarction by serial cardiac enzymes. She underwent Lexis and Cardiolite study on 05/12/2011 which revealed no evidence of ischemia and normal LV function. A 2-D echocardiogram showed normal LV function.  A d-dimer was elevated and she underwent chest CT angio which revealed no evidence of pulmonary embolism. She was subsequently discharged home in stable condition.   Discharge Exam: Blood pressure 133/78, pulse 81, temperature 98 F (36.7 C), temperature source Oral, resp. rate 20, height 5\' 4"  (1.626 m), weight 69 kg (152 lb 1.9 oz), SpO2 99.00%.   General appearance: alert, cooperative and appears stated age Head: Normocephalic, without obvious abnormality, atraumatic Neck: no adenopathy, no carotid bruit, no JVD, supple, symmetrical, trachea midline and thyroid not enlarged, symmetric, no tenderness/mass/nodules Resp: clear to auscultation bilaterally Cardio: regular rate and rhythm, S1, S2 normal, no murmur,  click, rub or gallop Extremities: extremities normal, atraumatic, no cyanosis or edema Labs:   Lab Results  Component Value Date   WBC 9.5 05/11/2011   HGB 11.0* 05/11/2011   HCT 33.5* 05/11/2011   MCV 88.9 05/11/2011   PLT 172 05/11/2011    Lab 05/11/11 0417  NA 140  K 3.7  CL 104  CO2 25  BUN 28*  CREATININE 1.16*  CALCIUM 9.4  PROT --  BILITOT --  ALKPHOS --  ALT --  AST --  GLUCOSE 123*   Lab Results  Component Value Date   CKTOTAL 52 05/11/2011   CKMB 2.9 05/11/2011   TROPONINI <0.30 05/11/2011    Lab Results  Component Value Date   CHOL 160 05/11/2011   CHOL 130 11/13/2010   Lab Results  Component Value Date   HDL 52 05/11/2011   HDL 51.50 11/13/2010   Lab Results  Component Value Date   LDLCALC 50 05/11/2011   Lab Results  Component Value Date   TRIG 292* 05/11/2011   TRIG 205.0* 11/13/2010   Lab Results  Component Value Date   CHOLHDL 3.1 05/11/2011   CHOLHDL 3 11/13/2010   Lab Results  Component Value Date   LDLDIRECT 60.7 11/13/2010      Radiology: *RADIOLOGY REPORT*  Clinical Data: Chest pain, shortness of breath and elevated D-  dimer.  CT ANGIOGRAPHY CHEST WITH CONTRAST  Technique: Multidetector CT imaging of the chest was performed  using the standard protocol during bolus administration of  intravenous contrast. Multiplanar CT image reconstructions  including MIPs were obtained to evaluate the vascular anatomy.  Contrast: 80mL OMNIPAQUE IOHEXOL 350 MG/ML IV SOLN  Comparison: Chest radiographs dated 05/10/2011 and chest CT  dated  07/06/2010.  Findings: Normally opacified pulmonary arteries with no pulmonary  arterial filling defects. No lung masses or enlarged lymph nodes.  Linear density in the lingula and both lower lobes. Some of this  was previously present and some is new. Large hiatal hernia.  Previously noted spinal fixation hardware and chronic vertebral  compression deformities.  Review of the MIP images confirms the  above findings.  IMPRESSION:  1. No pulmonary emboli or acute abnormality.  2. Bilateral linear atelectasis and scarring.  3. Chronic thoracic vertebral compression deformities and fixation  hardware.  Original Report Authenticated By: Darrol Angel, M.D.   *RADIOLOGY REPORT*  Clinical Data: Coronary artery disease and chest pain  Technique: Standard myocardial SPECT imaging performed after  resting intravenous injection of 10 mCi Tc-41m tetrofosmin.  Subsequently, intravenous infusion of Lexiscan performed under the  supervision of the Cardiology staff. At peak effect of the drug,  30 mCi Tc-11mtetrofosmin injected intravenously and standard  myocardial SPECT imaging performed. Quantitative gated imaging  also performed to evaluate left ventricular wall motion and  estimate left ventricular ejection fraction.  Comparison: None  MYOCARDIAL IMAGING WITH SPECT (REST AND PHARMACOLOGIC-STRESS)  Findings: Mild to moderate fixed defect involves the mid and  basilar segments of the inferior wall.  GATED LEFT VENTRICULAR WALL MOTION STUDY  Findings: Review of the gated images demonstrates normal left  ventricular wall motion and thickening.  LEFT VENTRICULAR EJECTION FRACTION  Findings: QGS ejection fraction measures 79%% , with an end-  diastolic volume of 35 ml and an end-systolic volume of 7 ml.  IMPRESSION:  1. Fixed defect involving the inferior wall, likely representing  diaphragmatic attenuation artifact. No evidence for ischemia.  2. Normal left ventricular systolic function.  3. Left ventricular ejection fraction equals 79%.  Original Report Authenticated By: Rosealee Albee, M.D.          EKG: Sinus rhythm  FOLLOW UP PLANS AND APPOINTMENTS Discharge Orders    Future Orders Please Complete By Expires   Diet - low sodium heart healthy      Diet - low sodium heart healthy      Increase activity slowly      No wound care      Call MD for:  severe uncontrolled pain        Call MD for:  difficulty breathing, headache or visual disturbances      Increase activity slowly        Current Discharge Medication List    CONTINUE these medications which have NOT CHANGED   Details  acetaminophen (TYLENOL) 500 MG tablet Take 500 mg by mouth every 6 (six) hours as needed. For pain    Ascorbic Acid (VITAMIN C PO) Take 500 mg by mouth daily.     aspirin 81 MG tablet Take 81 mg by mouth daily.      Calcium Carbonate (CALTRATE 600 PO) Take 1 tablet by mouth 2 (two) times daily.      Cholecalciferol (VITAMIN D PO) Take by mouth daily.      diazepam (VALIUM) 5 MG tablet Take 5 mg by mouth at bedtime.      esomeprazole (NEXIUM) 40 MG capsule Take 40 mg by mouth daily before breakfast.      fish oil-omega-3 fatty acids 1000 MG capsule Take 1 g by mouth daily.     folic acid (FOLVITE) 1 MG tablet Take 1 mg by mouth daily.      HYDROcodone-acetaminophen (VICODIN) 5-500 MG per tablet 1  tablet every 4 (four) hours as needed. For pain    imipramine (TOFRANIL) 50 MG tablet Take 1 tablet by mouth Daily.    levothyroxine (SYNTHROID, LEVOTHROID) 50 MCG tablet Take 50 mcg by mouth daily.     lisinopril-hydrochlorothiazide (PRINZIDE,ZESTORETIC) 20-12.5 MG per tablet Take 1 tablet by mouth daily.      metoprolol (LOPRESSOR) 50 MG tablet Take 50 mg by mouth 2 (two) times daily.      Multiple Vitamin (MULTIVITAMIN) tablet Take 1 tablet by mouth daily.      predniSONE (DELTASONE) 5 MG tablet Take 5 mg by mouth daily.      rosuvastatin (CRESTOR) 20 MG tablet Take 20 mg by mouth daily.      sertraline (ZOLOFT) 50 MG tablet Take 50 mg by mouth daily.         Follow-up Information    Make an appointment with Lesleigh Noe. (call for appointment in 2 weeks)    Contact information:   [address] [facility], [place]. [facility] [place] [address] [phone]          BRING ALL MEDICATIONS WITH YOU TO FOLLOW UP  APPOINTMENTS  Time spent with patient to include physician time:45 minutes Signed: Quintella Reichert 05/12/2011, 8:14 PM

## 2011-05-12 NOTE — Plan of Care (Signed)
Problem: Phase II Progression Outcomes Goal: Stress Test if indicated Outcome: Completed/Met Date Met:  05/12/11 Stress test 05/12/11

## 2011-05-12 NOTE — Progress Notes (Signed)
Patient discharge per MD Turner. Patient discharged to son in law. Patient aware of a follow up appointment needed in 2weeks. Also all medications gone over with patient and all discharge instructions gone over with patient.  A.Jabre Heo RN

## 2011-05-12 NOTE — Progress Notes (Signed)
SUBJECTIVE:  The patient is doing well today. She denies any chest pain but continues to have intermittent back pain.  She says that the back pain is worse sitting up and worse taking a deep breath in.  She denies any SOB.  OBJECTIVE:   Vitals:   Filed Vitals:   05/12/11 0941 05/12/11 1002 05/12/11 1004 05/12/11 1007  BP: 184/75 171/73 173/82 184/85  Pulse: 77 91 100 98  Temp:      TempSrc:      Resp: 16     Height:      Weight:      SpO2:       I&O's:   Intake/Output Summary (Last 24 hours) at 05/12/11 1009 Last data filed at 05/11/11 2241  Gross per 24 hour  Intake    708 ml  Output    901 ml  Net   -193 ml   TELEMETRY: Reviewed telemetry pt in NSR  PHYSICAL EXAM General: Well developed, well nourished, in no acute distress Head: Eyes PERRLA, No xanthomas.   Normal cephalic and atramatic  Lungs:   Clear bilaterally to auscultation and percussion. Heart:   HRRR S1 S2 Pulses are 2+ & equal.            No carotid bruit. No JVD.  No abdominal bruits. No femoral bruits. Abdomen: Bowel sounds are positive, abdomen soft and non-tender without masses or                  Hernia's noted. Extremities: No clubbing, cyanosis or edema.  DP +1 Neuro: Alert and oriented X 3. Psych:  Good affect, responds appropriately   LABS: Basic Metabolic Panel:  Basename 05/11/11 0417 05/10/11 1900  NA 140 138  K 3.7 4.2  CL 104 102  CO2 25 25  GLUCOSE 123* 162*  BUN 28* 30*  CREATININE 1.16* 1.27*  CALCIUM 9.4 9.7  MG -- --  PHOS -- --    CBC:  Basename 05/11/11 0417 05/10/11 1900  WBC 9.5 11.0*  NEUTROABS -- 8.7*  HGB 11.0* 11.4*  HCT 33.5* 34.0*  MCV 88.9 89.5  PLT 172 180   Cardiac Enzymes:  Basename 05/11/11 1620 05/11/11 1050 05/11/11 0414  CKTOTAL 52 44 51  CKMB 2.9 2.9 3.4  CKMBINDEX -- -- --  TROPONINI <0.30 <0.30 <0.30    Fasting Lipid Panel:  Basename 05/11/11 0414  CHOL 160  HDL 52  LDLCALC 50  TRIG 292*  CHOLHDL 3.1  LDLDIRECT --   Coag Panel:     Lab Results  Component Value Date   INR 1.00 05/11/2011   INR 0.92 07/06/2010   INR 2.0* 08/31/2007    RADIOLOGY: Dg Chest 2 View  05/10/2011  *RADIOLOGY REPORT*  Clinical Data: Shortness of breath and bilateral posterior shoulder pain for 2 days, radiating into the neck.  CHEST - 2 VIEW  Comparison: Chest radiograph and CT of the chest performed 07/06/2010  Findings: The lungs are relatively well-aerated and clear.  There is no evidence of focal opacification, pleural effusion or pneumothorax.  The heart is normal in size; the mediastinal contour is within normal limits.  No acute osseous abnormalities are seen. The fractured right-sided thoracolumbar spinal rod is again seen; the thoracolumbar rods are stable in appearance.  A large hiatal hernia is again noted, filled with fluid and air.  IMPRESSION:  1.  No acute cardiopulmonary process seen. 2.  Large hiatal hernia again noted, filled with fluid and air. 3.  Stable appearance  to fractured right-sided thoracolumbar spinal rod.  Original Report Authenticated By: Tonia Ghent, M.D.   ASSESSMENT:  1.  Infrascapular back pain  worse with inspiration with negative cardiac enzymes x3. 2.  CAD with NSTEMI 06/2010  PLAN:   1.  Lexiscan cardiolyte to rule out ischemia 2.  D-Dimer 3. 2D echo to rule out pericardial effusion  Donel Osowski R  05/12/2011  10:09 AM

## 2011-05-12 NOTE — Progress Notes (Signed)
*  PRELIMINARY RESULTS* Echocardiogram 2D Echocardiogram has been performed.  Glean Salen [facility] RDCS 05/12/2011, 4:08 PM

## 2011-05-12 NOTE — Progress Notes (Signed)
Patients daughter was called and informed of discharge. Patients son in law Jorja Loa will pick up patient.

## 2011-05-14 MED FILL — Perflutren Lipid Microsphere IV Susp 6.52 MG/ML: INTRAVENOUS | Qty: 2 | Status: AC

## 2011-08-11 DIAGNOSIS — R002 Palpitations: Secondary | ICD-10-CM | POA: Diagnosis not present

## 2011-08-11 DIAGNOSIS — I214 Non-ST elevation (NSTEMI) myocardial infarction: Secondary | ICD-10-CM | POA: Diagnosis not present

## 2011-08-11 DIAGNOSIS — I10 Essential (primary) hypertension: Secondary | ICD-10-CM | POA: Diagnosis not present

## 2011-08-11 DIAGNOSIS — I2510 Atherosclerotic heart disease of native coronary artery without angina pectoris: Secondary | ICD-10-CM | POA: Diagnosis not present

## 2011-09-02 DIAGNOSIS — M810 Age-related osteoporosis without current pathological fracture: Secondary | ICD-10-CM | POA: Diagnosis not present

## 2011-09-02 DIAGNOSIS — M159 Polyosteoarthritis, unspecified: Secondary | ICD-10-CM | POA: Diagnosis not present

## 2011-09-02 DIAGNOSIS — IMO0002 Reserved for concepts with insufficient information to code with codable children: Secondary | ICD-10-CM | POA: Diagnosis not present

## 2011-10-07 DIAGNOSIS — F329 Major depressive disorder, single episode, unspecified: Secondary | ICD-10-CM | POA: Diagnosis not present

## 2011-10-07 DIAGNOSIS — R0609 Other forms of dyspnea: Secondary | ICD-10-CM | POA: Diagnosis not present

## 2011-10-07 DIAGNOSIS — I219 Acute myocardial infarction, unspecified: Secondary | ICD-10-CM | POA: Diagnosis not present

## 2011-10-07 DIAGNOSIS — R0989 Other specified symptoms and signs involving the circulatory and respiratory systems: Secondary | ICD-10-CM | POA: Diagnosis not present

## 2011-10-07 DIAGNOSIS — I2510 Atherosclerotic heart disease of native coronary artery without angina pectoris: Secondary | ICD-10-CM | POA: Diagnosis not present

## 2011-12-01 ENCOUNTER — Inpatient Hospital Stay (HOSPITAL_COMMUNITY)
Admission: EM | Admit: 2011-12-01 | Discharge: 2011-12-09 | DRG: 064 | Disposition: A | Discharge status: Skilled Nursing Facility | Payer: Medicare Other | Attending: Internal Medicine | Admitting: Internal Medicine

## 2011-12-01 ENCOUNTER — Emergency Department (HOSPITAL_COMMUNITY): Payer: Medicare Other

## 2011-12-01 ENCOUNTER — Encounter (HOSPITAL_COMMUNITY): Payer: Self-pay | Admitting: Emergency Medicine

## 2011-12-01 DIAGNOSIS — I10 Essential (primary) hypertension: Secondary | ICD-10-CM | POA: Diagnosis not present

## 2011-12-01 DIAGNOSIS — R2681 Unsteadiness on feet: Secondary | ICD-10-CM

## 2011-12-01 DIAGNOSIS — I252 Old myocardial infarction: Secondary | ICD-10-CM | POA: Diagnosis not present

## 2011-12-01 DIAGNOSIS — K219 Gastro-esophageal reflux disease without esophagitis: Secondary | ICD-10-CM

## 2011-12-01 DIAGNOSIS — I129 Hypertensive chronic kidney disease with stage 1 through stage 4 chronic kidney disease, or unspecified chronic kidney disease: Secondary | ICD-10-CM | POA: Diagnosis present

## 2011-12-01 DIAGNOSIS — R29898 Other symptoms and signs involving the musculoskeletal system: Secondary | ICD-10-CM | POA: Diagnosis not present

## 2011-12-01 DIAGNOSIS — Z5189 Encounter for other specified aftercare: Secondary | ICD-10-CM | POA: Diagnosis not present

## 2011-12-01 DIAGNOSIS — R4789 Other speech disturbances: Secondary | ICD-10-CM | POA: Diagnosis not present

## 2011-12-01 DIAGNOSIS — F329 Major depressive disorder, single episode, unspecified: Secondary | ICD-10-CM | POA: Diagnosis present

## 2011-12-01 DIAGNOSIS — R197 Diarrhea, unspecified: Secondary | ICD-10-CM | POA: Diagnosis not present

## 2011-12-01 DIAGNOSIS — R5383 Other fatigue: Secondary | ICD-10-CM

## 2011-12-01 DIAGNOSIS — R630 Anorexia: Secondary | ICD-10-CM

## 2011-12-01 DIAGNOSIS — I69959 Hemiplegia and hemiparesis following unspecified cerebrovascular disease affecting unspecified side: Secondary | ICD-10-CM | POA: Diagnosis not present

## 2011-12-01 DIAGNOSIS — N182 Chronic kidney disease, stage 2 (mild): Secondary | ICD-10-CM | POA: Diagnosis not present

## 2011-12-01 DIAGNOSIS — I219 Acute myocardial infarction, unspecified: Secondary | ICD-10-CM | POA: Diagnosis not present

## 2011-12-01 DIAGNOSIS — M069 Rheumatoid arthritis, unspecified: Secondary | ICD-10-CM

## 2011-12-01 DIAGNOSIS — I2510 Atherosclerotic heart disease of native coronary artery without angina pectoris: Secondary | ICD-10-CM | POA: Diagnosis not present

## 2011-12-01 DIAGNOSIS — M1990 Unspecified osteoarthritis, unspecified site: Secondary | ICD-10-CM

## 2011-12-01 DIAGNOSIS — Z8673 Personal history of transient ischemic attack (TIA), and cerebral infarction without residual deficits: Secondary | ICD-10-CM | POA: Diagnosis not present

## 2011-12-01 DIAGNOSIS — E039 Hypothyroidism, unspecified: Secondary | ICD-10-CM | POA: Diagnosis not present

## 2011-12-01 DIAGNOSIS — E2740 Unspecified adrenocortical insufficiency: Secondary | ICD-10-CM

## 2011-12-01 DIAGNOSIS — R109 Unspecified abdominal pain: Secondary | ICD-10-CM | POA: Diagnosis not present

## 2011-12-01 DIAGNOSIS — F8089 Other developmental disorders of speech and language: Secondary | ICD-10-CM | POA: Diagnosis not present

## 2011-12-01 DIAGNOSIS — E46 Unspecified protein-calorie malnutrition: Secondary | ICD-10-CM | POA: Diagnosis not present

## 2011-12-01 DIAGNOSIS — R509 Fever, unspecified: Secondary | ICD-10-CM | POA: Diagnosis not present

## 2011-12-01 DIAGNOSIS — I2109 ST elevation (STEMI) myocardial infarction involving other coronary artery of anterior wall: Secondary | ICD-10-CM | POA: Diagnosis not present

## 2011-12-01 DIAGNOSIS — I672 Cerebral atherosclerosis: Secondary | ICD-10-CM | POA: Diagnosis not present

## 2011-12-01 DIAGNOSIS — E2749 Other adrenocortical insufficiency: Secondary | ICD-10-CM | POA: Diagnosis not present

## 2011-12-01 DIAGNOSIS — E876 Hypokalemia: Secondary | ICD-10-CM | POA: Diagnosis present

## 2011-12-01 DIAGNOSIS — R112 Nausea with vomiting, unspecified: Secondary | ICD-10-CM | POA: Diagnosis not present

## 2011-12-01 DIAGNOSIS — K922 Gastrointestinal hemorrhage, unspecified: Secondary | ICD-10-CM

## 2011-12-01 DIAGNOSIS — R1310 Dysphagia, unspecified: Secondary | ICD-10-CM | POA: Diagnosis not present

## 2011-12-01 DIAGNOSIS — I6350 Cerebral infarction due to unspecified occlusion or stenosis of unspecified cerebral artery: Principal | ICD-10-CM | POA: Diagnosis present

## 2011-12-01 DIAGNOSIS — M6281 Muscle weakness (generalized): Secondary | ICD-10-CM | POA: Diagnosis not present

## 2011-12-01 DIAGNOSIS — F419 Anxiety disorder, unspecified: Secondary | ICD-10-CM

## 2011-12-01 DIAGNOSIS — R0602 Shortness of breath: Secondary | ICD-10-CM

## 2011-12-01 DIAGNOSIS — K279 Peptic ulcer, site unspecified, unspecified as acute or chronic, without hemorrhage or perforation: Secondary | ICD-10-CM

## 2011-12-01 DIAGNOSIS — R279 Unspecified lack of coordination: Secondary | ICD-10-CM | POA: Diagnosis not present

## 2011-12-01 DIAGNOSIS — K449 Diaphragmatic hernia without obstruction or gangrene: Secondary | ICD-10-CM

## 2011-12-01 DIAGNOSIS — E785 Hyperlipidemia, unspecified: Secondary | ICD-10-CM | POA: Diagnosis present

## 2011-12-01 DIAGNOSIS — G319 Degenerative disease of nervous system, unspecified: Secondary | ICD-10-CM | POA: Diagnosis not present

## 2011-12-01 DIAGNOSIS — I639 Cerebral infarction, unspecified: Secondary | ICD-10-CM

## 2011-12-01 DIAGNOSIS — N289 Disorder of kidney and ureter, unspecified: Secondary | ICD-10-CM

## 2011-12-01 DIAGNOSIS — R1110 Vomiting, unspecified: Secondary | ICD-10-CM | POA: Diagnosis not present

## 2011-12-01 DIAGNOSIS — R5381 Other malaise: Secondary | ICD-10-CM | POA: Diagnosis present

## 2011-12-01 DIAGNOSIS — J189 Pneumonia, unspecified organism: Secondary | ICD-10-CM | POA: Diagnosis not present

## 2011-12-01 DIAGNOSIS — R918 Other nonspecific abnormal finding of lung field: Secondary | ICD-10-CM | POA: Diagnosis not present

## 2011-12-01 DIAGNOSIS — E440 Moderate protein-calorie malnutrition: Secondary | ICD-10-CM

## 2011-12-01 DIAGNOSIS — H9201 Otalgia, right ear: Secondary | ICD-10-CM

## 2011-12-01 DIAGNOSIS — R262 Difficulty in walking, not elsewhere classified: Secondary | ICD-10-CM | POA: Diagnosis not present

## 2011-12-01 DIAGNOSIS — F32A Depression, unspecified: Secondary | ICD-10-CM

## 2011-12-01 DIAGNOSIS — R079 Chest pain, unspecified: Secondary | ICD-10-CM | POA: Diagnosis not present

## 2011-12-01 DIAGNOSIS — I6789 Other cerebrovascular disease: Secondary | ICD-10-CM | POA: Diagnosis not present

## 2011-12-01 DIAGNOSIS — R4781 Slurred speech: Secondary | ICD-10-CM

## 2011-12-01 DIAGNOSIS — F3289 Other specified depressive episodes: Secondary | ICD-10-CM | POA: Diagnosis present

## 2011-12-01 DIAGNOSIS — G459 Transient cerebral ischemic attack, unspecified: Secondary | ICD-10-CM | POA: Diagnosis not present

## 2011-12-01 DIAGNOSIS — I6529 Occlusion and stenosis of unspecified carotid artery: Secondary | ICD-10-CM | POA: Diagnosis not present

## 2011-12-01 HISTORY — DX: Transient cerebral ischemic attack, unspecified: G45.9

## 2011-12-01 HISTORY — DX: Cerebral infarction, unspecified: I63.9

## 2011-12-01 LAB — CBC
MCV: 88.1 fL (ref 78.0–100.0)
Platelets: 174 10*3/uL (ref 150–400)
RDW: 14.2 % (ref 11.5–15.5)
WBC: 11.4 10*3/uL — ABNORMAL HIGH (ref 4.0–10.5)

## 2011-12-01 LAB — URINE MICROSCOPIC-ADD ON

## 2011-12-01 LAB — DIFFERENTIAL
Basophils Absolute: 0 10*3/uL (ref 0.0–0.1)
Basophils Relative: 0 % (ref 0–1)
Eosinophils Relative: 2 % (ref 0–5)
Lymphocytes Relative: 22 % (ref 12–46)
Neutro Abs: 7.7 10*3/uL (ref 1.7–7.7)

## 2011-12-01 LAB — COMPREHENSIVE METABOLIC PANEL
ALT: 15 U/L (ref 0–35)
AST: 20 U/L (ref 0–37)
Albumin: 3.8 g/dL (ref 3.5–5.2)
CO2: 27 mEq/L (ref 19–32)
Calcium: 9.4 mg/dL (ref 8.4–10.5)
GFR calc non Af Amer: 26 mL/min — ABNORMAL LOW (ref >90)
Sodium: 140 mEq/L (ref 135–145)
Total Protein: 6.9 g/dL (ref 6.0–8.3)

## 2011-12-01 LAB — URINALYSIS, ROUTINE W REFLEX MICROSCOPIC
Bilirubin Urine: NEGATIVE
Glucose, UA: NEGATIVE mg/dL
Ketones, ur: NEGATIVE mg/dL
pH: 5 (ref 5.0–8.0)

## 2011-12-01 MED ORDER — LISINOPRIL 20 MG PO TABS
20.0000 mg | ORAL_TABLET | Freq: Every day | ORAL | Status: DC
  Administered 2011-12-02 – 2011-12-09 (×7): 20 mg via ORAL
  Filled 2011-12-01 (×8): qty 1

## 2011-12-01 MED ORDER — POTASSIUM CHLORIDE IN NACL 20-0.9 MEQ/L-% IV SOLN
INTRAVENOUS | Status: DC
  Administered 2011-12-01: 23:00:00 via INTRAVENOUS
  Filled 2011-12-01 (×21): qty 1000

## 2011-12-01 MED ORDER — OMEGA-3-ACID ETHYL ESTERS 1 G PO CAPS
2.0000 g | ORAL_CAPSULE | Freq: Two times a day (BID) | ORAL | Status: DC
  Administered 2011-12-02 – 2011-12-09 (×11): 2 g via ORAL
  Filled 2011-12-01 (×17): qty 2

## 2011-12-01 MED ORDER — FOLIC ACID 1 MG PO TABS
1.0000 mg | ORAL_TABLET | Freq: Every day | ORAL | Status: DC
  Administered 2011-12-02 – 2011-12-09 (×7): 1 mg via ORAL
  Filled 2011-12-01 (×8): qty 1

## 2011-12-01 MED ORDER — SERTRALINE HCL 50 MG PO TABS
50.0000 mg | ORAL_TABLET | Freq: Every day | ORAL | Status: DC
  Administered 2011-12-01: 50 mg via ORAL
  Filled 2011-12-01 (×2): qty 1

## 2011-12-01 MED ORDER — ONDANSETRON HCL 4 MG/2ML IJ SOLN
4.0000 mg | Freq: Four times a day (QID) | INTRAMUSCULAR | Status: DC | PRN
  Administered 2011-12-05 – 2011-12-06 (×2): 4 mg via INTRAVENOUS
  Filled 2011-12-01 (×2): qty 2

## 2011-12-01 MED ORDER — ATORVASTATIN CALCIUM 80 MG PO TABS
80.0000 mg | ORAL_TABLET | Freq: Every day | ORAL | Status: DC
  Administered 2011-12-08: 80 mg via ORAL
  Filled 2011-12-01 (×8): qty 1

## 2011-12-01 MED ORDER — LEVOTHYROXINE SODIUM 50 MCG PO TABS
50.0000 ug | ORAL_TABLET | Freq: Every day | ORAL | Status: DC
  Administered 2011-12-02 – 2011-12-09 (×8): 50 ug via ORAL
  Filled 2011-12-01 (×9): qty 1

## 2011-12-01 MED ORDER — ONDANSETRON HCL 4 MG PO TABS: 4.0000 mg | ORAL_TABLET | Freq: Four times a day (QID) | ORAL | Status: DC | PRN

## 2011-12-01 MED ORDER — PANTOPRAZOLE SODIUM 40 MG PO TBEC
40.0000 mg | DELAYED_RELEASE_TABLET | Freq: Every day | ORAL | Status: DC
  Administered 2011-12-02 – 2011-12-09 (×7): 40 mg via ORAL
  Filled 2011-12-01 (×3): qty 1

## 2011-12-01 MED ORDER — ACETAMINOPHEN 325 MG PO TABS
ORAL_TABLET | ORAL | Status: AC
  Administered 2011-12-01: 650 mg via ORAL
  Filled 2011-12-01: qty 2

## 2011-12-01 MED ORDER — VITAMIN D3 25 MCG (1000 UNIT) PO TABS
1000.0000 [IU] | ORAL_TABLET | Freq: Every day | ORAL | Status: DC
  Administered 2011-12-02 – 2011-12-09 (×7): 1000 [IU] via ORAL
  Filled 2011-12-01 (×8): qty 1

## 2011-12-01 MED ORDER — ONE-DAILY MULTI VITAMINS PO TABS: 1.0000 | ORAL_TABLET | Freq: Every day | ORAL | Status: DC

## 2011-12-01 MED ORDER — HYDROCHLOROTHIAZIDE 12.5 MG PO CAPS
12.5000 mg | ORAL_CAPSULE | Freq: Every day | ORAL | Status: DC
  Administered 2011-12-02 – 2011-12-09 (×8): 12.5 mg via ORAL
  Filled 2011-12-01 (×8): qty 1

## 2011-12-01 MED ORDER — DIAZEPAM 5 MG PO TABS
5.0000 mg | ORAL_TABLET | Freq: Every day | ORAL | Status: DC
  Administered 2011-12-01 – 2011-12-08 (×7): 5 mg via ORAL
  Filled 2011-12-01 (×8): qty 1

## 2011-12-01 MED ORDER — SODIUM CHLORIDE 0.9 % IJ SOLN
3.0000 mL | Freq: Two times a day (BID) | INTRAMUSCULAR | Status: DC
  Administered 2011-12-02 – 2011-12-09 (×12): 3 mL via INTRAVENOUS

## 2011-12-01 MED ORDER — ADULT MULTIVITAMIN W/MINERALS CH
1.0000 | ORAL_TABLET | Freq: Every day | ORAL | Status: DC
  Administered 2011-12-02 – 2011-12-09 (×6): 1 via ORAL
  Filled 2011-12-01 (×8): qty 1

## 2011-12-01 MED ORDER — LISINOPRIL-HYDROCHLOROTHIAZIDE 20-12.5 MG PO TABS: 1.0000 | ORAL_TABLET | Freq: Every day | ORAL | Status: DC

## 2011-12-01 MED ORDER — ACETAMINOPHEN 500 MG PO TABS
500.0000 mg | ORAL_TABLET | Freq: Four times a day (QID) | ORAL | Status: DC | PRN
  Administered 2011-12-01: 650 mg via ORAL
  Administered 2011-12-03 – 2011-12-04 (×2): 500 mg via ORAL
  Filled 2011-12-01 (×2): qty 1

## 2011-12-01 MED ORDER — DOCUSATE SODIUM 100 MG PO CAPS
100.0000 mg | ORAL_CAPSULE | Freq: Two times a day (BID) | ORAL | Status: DC
  Administered 2011-12-02 – 2011-12-09 (×11): 100 mg via ORAL
  Filled 2011-12-01 (×7): qty 1

## 2011-12-01 MED ORDER — HYDROCODONE-ACETAMINOPHEN 5-325 MG PO TABS
1.0000 | ORAL_TABLET | ORAL | Status: DC | PRN
  Administered 2011-12-07: 1 via ORAL
  Filled 2011-12-01 (×3): qty 1

## 2011-12-01 MED ORDER — PREDNISONE 5 MG PO TABS
5.0000 mg | ORAL_TABLET | Freq: Every day | ORAL | Status: DC
  Administered 2011-12-02 – 2011-12-07 (×5): 5 mg via ORAL
  Filled 2011-12-01 (×6): qty 1

## 2011-12-01 MED ORDER — METOPROLOL TARTRATE 50 MG PO TABS
50.0000 mg | ORAL_TABLET | Freq: Two times a day (BID) | ORAL | Status: DC
  Administered 2011-12-01 – 2011-12-09 (×14): 50 mg via ORAL
  Filled 2011-12-01 (×17): qty 1

## 2011-12-01 MED ORDER — ENOXAPARIN SODIUM 40 MG/0.4ML ~~LOC~~ SOLN
40.0000 mg | Freq: Every day | SUBCUTANEOUS | Status: DC
  Administered 2011-12-01 – 2011-12-05 (×5): 40 mg via SUBCUTANEOUS
  Filled 2011-12-01 (×7): qty 0.4

## 2011-12-01 MED ORDER — IMIPRAMINE HCL 50 MG PO TABS
50.0000 mg | ORAL_TABLET | Freq: Every day | ORAL | Status: DC
  Administered 2011-12-01 – 2011-12-08 (×7): 50 mg via ORAL
  Filled 2011-12-01 (×11): qty 1

## 2011-12-01 MED ORDER — POTASSIUM CHLORIDE CRYS ER 20 MEQ PO TBCR
40.0000 meq | EXTENDED_RELEASE_TABLET | Freq: Once | ORAL | Status: AC
  Administered 2011-12-01: 40 meq via ORAL
  Filled 2011-12-01: qty 2

## 2011-12-01 MED ORDER — ASPIRIN EC 325 MG PO TBEC
325.0000 mg | DELAYED_RELEASE_TABLET | Freq: Every day | ORAL | Status: DC
  Administered 2011-12-01 – 2011-12-09 (×8): 325 mg via ORAL
  Filled 2011-12-01 (×9): qty 1

## 2011-12-01 MED ORDER — VITAMIN C 500 MG PO TABS
500.0000 mg | ORAL_TABLET | Freq: Every day | ORAL | Status: DC
  Administered 2011-12-02 – 2011-12-09 (×7): 500 mg via ORAL
  Filled 2011-12-01 (×8): qty 1

## 2011-12-01 MED ORDER — CHOLECALCIFEROL 250 MCG (10000 UT) PO CAPS: 1000.0000 [IU] | ORAL_CAPSULE | Freq: Every day | ORAL | Status: DC

## 2011-12-01 NOTE — ED Notes (Signed)
Patient transported to CT 

## 2011-12-01 NOTE — ED Notes (Signed)
Daughter, Elease Hashimoto phone number [phone]. Elnora Morrison phone number [phone]

## 2011-12-01 NOTE — ED Notes (Signed)
Per ems. Pt. Woke up this morning with weakness and limited mobility. Family reports slurred speech since yesterday. Pt. Has previous hx of tia and cva. Pt. Right shoulder is broken from previous injury.

## 2011-12-01 NOTE — H&P (Signed)
PCP:   Minda Meo, MD, MD   Chief Complaint:  TIA  HPI: Patient is a 78 year old female who has had slurred speech for the past couple of days.  Her family considered tohe possibility that this was TIA as she has had in the past, but waited to see if it would improve.  It has not to the best of their knowledge, so she was brought to the [facility] ER, where assessment was nonacute by CT, she is not code stroke due to timing, and infection workup negative.  Only a bit dry per her renal indices.  She has slurred speech per family, but I am not familiar with her and she does not seem to have a considerable deficit.  Requires admission for further workup.  It is noted however that she is intolerant to Plavix (listed as allergy in her office chart)  Review of Systems:  Review of Systems - Negative except slurred speech on a 10 point review Past Medical History: Past Medical History  Diagnosis Date  . SOB (shortness of breath)   . MI (myocardial infarction)   . Poor appetite   . Arthritis   . Fatigue   . Right ear pain   . Renal insufficiency   . RA (rheumatoid arthritis)   . PUD (peptic ulcer disease)   . GI bleed 2005  . GERD (gastroesophageal reflux disease)   . Hiatal hernia   . Hypothyroidism   . Osteoporosis   . Depression   . Anxiety   . Coronary artery disease   . CVA (cerebral vascular accident)   . TIA (transient ischemic attack)    Past Surgical History  Procedure Date  . Cardiac catheterization     SHOWED RUPTURE PLAQUE IN THE LAD. THE LAD IS NONOBSTRUCTIVE WITH ONLY 30-40% STENOSIS  . Nstemi 06/2010  . Spinal fusion surgery   . Knee surgery     Medications: Prior to Admission medications   Medication Sig Start Date End Date Taking? Authorizing Provider  acetaminophen (TYLENOL) 500 MG tablet Take 500 mg by mouth every 6 (six) hours as needed. For pain   Yes Historical Provider, MD  Ascorbic Acid (VITAMIN C PO) Take 500 mg by mouth daily.    Yes Historical  Provider, MD  aspirin 81 MG tablet Take 81 mg by mouth daily.     Yes Historical Provider, MD  CALCIUM PO Take 1 tablet by mouth 2 (two) times daily.   Yes Historical Provider, MD  Cholecalciferol 10000 UNITS CAPS Take 1,000 Units by mouth daily.   Yes Historical Provider, MD  diazepam (VALIUM) 5 MG tablet Take 5 mg by mouth at bedtime.     Yes Historical Provider, MD  esomeprazole (NEXIUM) 40 MG capsule Take 40 mg by mouth daily before breakfast.     Yes Historical Provider, MD  folic acid (FOLVITE) 1 MG tablet Take 1 mg by mouth daily.     Yes Historical Provider, MD  HYDROcodone-acetaminophen (VICODIN) 5-500 MG per tablet Take 1 tablet by mouth every 4 (four) hours as needed. For pain 11/01/10  Yes Historical Provider, MD  imipramine (TOFRANIL) 50 MG tablet Take 1 tablet by mouth Daily. 09/14/10  Yes Historical Provider, MD  levothyroxine (SYNTHROID, LEVOTHROID) 50 MCG tablet Take 50 mcg by mouth daily.    Yes Historical Provider, MD  lisinopril-hydrochlorothiazide (PRINZIDE,ZESTORETIC) 20-12.5 MG per tablet Take 1 tablet by mouth daily.     Yes Historical Provider, MD  metoprolol (LOPRESSOR) 50 MG tablet Take  50 mg by mouth 2 (two) times daily.     Yes Historical Provider, MD  Multiple Vitamin (MULTIVITAMIN) tablet Take 1 tablet by mouth daily.     Yes Historical Provider, MD  Omega-3 Fatty Acids (FISH OIL) 1200 MG CAPS Take 1,200 mg by mouth daily.   Yes Historical Provider, MD  predniSONE (DELTASONE) 5 MG tablet Take 5 mg by mouth daily.     Yes Historical Provider, MD  rosuvastatin (CRESTOR) 20 MG tablet Take 20 mg by mouth daily.     Yes Historical Provider, MD  sertraline (ZOLOFT) 50 MG tablet Take 50 mg by mouth daily.     Yes Historical Provider, MD    Allergies:   Allergies  Allergen Reactions  . Codeine     sick  . Percocet (Oxycodone-Acetaminophen)     unknown  . Plavix (Clopidogrel Bisulfate)   . Sulfur     Social History:  reports that she has never smoked. She has never  used smokeless tobacco. She reports that she does not drink alcohol or use illicit drugs.  Family History: Family History  Problem Relation Age of Onset  . Kidney disease Mother   . Kidney disease Brother     Physical Exam: Filed Vitals:   12/01/11 1556 12/01/11 1601 12/01/11 1606 12/01/11 1746  BP:  123/67  141/62  Pulse:  88  80  Temp:  98.6 F (37 C) 98.3 F (36.8 C)   TempSrc:  Oral    Resp:  15  19  SpO2: 99% 100%  97%   General appearance: alert, cooperative and appears stated age Head: Normocephalic, without obvious abnormality, atraumatic Eyes: conjunctivae/corneas clear. PERRL, EOM's intact.  Nose: Nares normal. Septum midline. Mucosa normal. No drainage or sinus tenderness. Throat: lips, mucosa, and tongue normal; teeth and gums normal Neck: no adenopathy, no carotid bruit, no JVD and thyroid not enlarged, symmetric, no tenderness/mass/nodules Resp: clear to auscultation bilaterally Cardio: regular rate and rhythm, S1, S2 normal, no murmur, click, rub or gallop GI: soft, non-tender; bowel sounds normal; no masses,  no organomegaly Extremities: extremities normal, atraumatic, no cyanosis or edema Pulses: 2+ and symmetric Lymph nodes: Cervical adenopathy: no cervical lymphadenopathy Neurologic: Alert and oriented X 3, normal strength and tone. Normal symmetric reflexes.     Labs on Admission:   [facility] 12/01/11 1622  NA 140  K 3.4*  CL 102  CO2 27  GLUCOSE 154*  BUN 38*  CREATININE 1.77*  CALCIUM 9.4  MG --  PHOS --    Basename 12/01/11 1622  AST 20  ALT 15  ALKPHOS 52  BILITOT 0.4  PROT 6.9  ALBUMIN 3.8   No results found for this basename: CKTOTAL:3,CKMB:3,CKMBINDEX:3,TROPONINI:3 in the last 72 hours Lab Results  Component Value Date   INR 1.03 12/01/2011   INR 1.00 05/11/2011   INR 0.92 07/06/2010  Radiological Exams on Admission: Dg Chest 2 View  12/01/2011  *RADIOLOGY REPORT*  Clinical Data: Chest pain.  CHEST - 2 VIEW  Comparison: Chest CT  05/12/2011 and chest radiograph 05/10/2011  Findings:  Large hiatal hernia with air-fluid level appears similar to prior studies.  Lung volumes are low.  Pulmonary vascularity is normal. No airspace disease, effusion, or pneumothorax.  At least two thoracic spine compression fractures appear stable compared to a CT of November 2012. Thoracic and upper lumbar fixation hardware noted.  No acute bony abnormality identified.  IMPRESSION: 1.  No acute findings. 2.  Large hiatal hernia. 3.  Stable chronic thoracic  spine compression deformities and spinal fixation hardware.  Original Report Authenticated By: Britta Mccreedy, M.D.   Ct Head Wo Contrast  12/01/2011  *RADIOLOGY REPORT*  Clinical Data: Slurred speech.  Stroke-like symptoms.  CT HEAD WITHOUT CONTRAST  Technique:  Contiguous axial images were obtained from the base of the skull through the vertex without contrast.  Comparison: Head CT 08/22/2007.  Findings: There is again mild cerebellar and mild - moderate cerebral atrophy with associated ex vacuo dilatation of the ventricular system.  Scattered throughout the deep and periventricular white matter of the cerebral hemispheres bilaterally there are patchy and confluent areas of decreased attenuation, most compatible with chronic microvascular ischemic disease. Today's study demonstrates no definite acute intracranial abnormality.  Specifically, no overt signs of acute/subacute cerebral ischemia, no hemorrhage, no focal mass, mass effect, hydrocephalus or abnormal intra or extra-axial fluid collections. Well defined foci of low attenuation in the thalami bilaterally and in the left basal ganglia are compatible with old lacunar infarctions.  No acute displaced skull fractures are identified. Visualized paranasal sinuses and mastoids are well pneumatized.  IMPRESSION: 1.  No acute intracranial abnormalities. 2.  Multiple old lacunar infarctions, extensive chronic microvascular ischemic disease of the white matter,  and cerebral and cerebellar atrophy redemonstrated, as detailed above.  Original Report Authenticated By: Florencia Reasons, M.D.  Assessment/Plan 1) TIA-  Will increase ASA to full tab.  MRI, ECHO and Carotids ordered as is Speech, PT, OT and soc work consults.  Options very limited other than risk factor management, which is being achieved due to other CV morbidities 2) CAD  Med mgmt, no new issues. 3) HTN  Controlled 4) Hyperlipidemia   Check in AM 5) Hypothyroid  Check in AM 6)  RA  Continue low dose prednisone 7) GERD  Protonix substituted per formulary. 8) Hypokalemia- noted in ER but no order to replace, will replace orally.  Hailei Besser W 12/01/2011, 6:22 PM

## 2011-12-01 NOTE — ED Provider Notes (Addendum)
I have seen and examined this patient with the resident.  I agree with the resident's note, assessment and plan except as indicated.    Patient presents with her daughter for onset of slurred speech that appears to have begun last night.  Patient does have a known history of TIAs.  The daughter states that another family member had seen the patient last night and noted some slurred speech last night.  When the daughter saw her today she also noted the slurred speech and brought her in for further evaluation.  There's been no other complaints of weakness, numbness or facial droop.  Patient reports dizziness which she describes as a room spinning sensation but this is been intermittent in nature for the last several months. Patient did not have a fall today but she felt more unsteady today despite using a walker at baseline.  Patient also notes that she may have some dysuria and urinary frequency which was treated with antibiotics by phone 2 weeks ago by her primary care physician.  The daughter also notes that the patient appears to be mildly confused and not as alert as her normal self today.   Nat Christen, MD 12/01/11 1625  I agree with the resident ECG read.    Nat Christen, MD 01/15/12 [phone]

## 2011-12-01 NOTE — ED Provider Notes (Signed)
History     CSN: [id number]  Arrival date & time 12/01/11  1552   First MD Initiated Contact with Patient 12/01/11 1605      Chief Complaint  Patient presents with  . Stroke Symptoms    (Consider location/radiation/quality/duration/timing/severity/associated sxs/prior treatment) Patient is a 78 y.o. female presenting with neurologic complaint. The history is provided by the patient, the EMS personnel and a relative.  Neurologic Problem The primary symptoms include speech change. Primary symptoms do not include headaches, dizziness, fever, nausea or vomiting. Primary symptoms comment: weakness The symptoms began yesterday. Episode duration: btw 16-24 hrs. Progression since onset: intermittent. The neurological symptoms are focal. Context: spontaneously.  Change in speech began 12 - 24 hours ago. The speech change is improving. Description of speech change: mild slurring.  Additional symptoms include weakness (diffusely). Associated medical issues comments: hx of TIA's.    Past Medical History  Diagnosis Date  . SOB (shortness of breath)   . MI (myocardial infarction)   . Poor appetite   . Arthritis   . Fatigue   . Right ear pain   . Renal insufficiency   . RA (rheumatoid arthritis)   . PUD (peptic ulcer disease)   . GI bleed 2005  . GERD (gastroesophageal reflux disease)   . Hiatal hernia   . Hypothyroidism   . Osteoporosis   . Depression   . Anxiety   . Coronary artery disease   . CVA (cerebral vascular accident)   . TIA (transient ischemic attack)     Past Surgical History  Procedure Date  . Cardiac catheterization     SHOWED RUPTURE PLAQUE IN THE LAD. THE LAD IS NONOBSTRUCTIVE WITH ONLY 30-40% STENOSIS  . Nstemi 06/2010  . Spinal fusion surgery   . Knee surgery     Family History  Problem Relation Age of Onset  . Kidney disease Mother   . Kidney disease Brother     History  Substance Use Topics  . Smoking status: Never Smoker   . Smokeless tobacco: Never  Used  . Alcohol Use: No    OB History    Grav Para Term Preterm Abortions TAB SAB Ect Mult Living                  Review of Systems  Constitutional: Negative for fever and fatigue.  HENT: Negative for congestion, drooling and neck pain.   Eyes: Negative for pain.  Respiratory: Negative for cough and shortness of breath.   Cardiovascular: Negative for chest pain.  Gastrointestinal: Negative for nausea, vomiting, abdominal pain and diarrhea.  Genitourinary: Negative for dysuria and hematuria.  Musculoskeletal: Negative for back pain and gait problem.  Skin: Negative for color change.  Neurological: Positive for speech change and weakness (diffusely). Negative for dizziness and headaches.  Hematological: Negative for adenopathy.  Psychiatric/Behavioral: Negative for behavioral problems.  All other systems reviewed and are negative.    Allergies  Codeine; Percocet; Plavix; and Sulfur  Home Medications   Current Outpatient Rx  Name Route Sig Dispense Refill  . ACETAMINOPHEN 500 MG PO TABS Oral Take 500 mg by mouth every 6 (six) hours as needed. For pain    . VITAMIN C PO Oral Take 500 mg by mouth daily.     . ASPIRIN 81 MG PO TABS Oral Take 81 mg by mouth daily.      Marland Kitchen CALTRATE 600 PO Oral Take 1 tablet by mouth 2 (two) times daily.      Marland Kitchen VITAMIN D  PO Oral Take by mouth daily.      Marland Kitchen DIAZEPAM 5 MG PO TABS Oral Take 5 mg by mouth at bedtime.      Marland Kitchen ESOMEPRAZOLE MAGNESIUM 40 MG PO CPDR Oral Take 40 mg by mouth daily before breakfast.      . OMEGA-3 FATTY ACIDS 1000 MG PO CAPS Oral Take 1 g by mouth daily.     Marland Kitchen FOLIC ACID 1 MG PO TABS Oral Take 1 mg by mouth daily.      Marland Kitchen HYDROCODONE-ACETAMINOPHEN 5-500 MG PO TABS  1 tablet every 4 (four) hours as needed. For pain    . IMIPRAMINE HCL 50 MG PO TABS Oral Take 1 tablet by mouth Daily.    Marland Kitchen LEVOTHYROXINE SODIUM 50 MCG PO TABS Oral Take 50 mcg by mouth daily.     Marland Kitchen LISINOPRIL-HYDROCHLOROTHIAZIDE 20-12.5 MG PO TABS Oral Take 1  tablet by mouth daily.      Marland Kitchen METOPROLOL TARTRATE 50 MG PO TABS Oral Take 50 mg by mouth 2 (two) times daily.      Marland Kitchen ONE-DAILY MULTI VITAMINS PO TABS Oral Take 1 tablet by mouth daily.      Marland Kitchen PREDNISONE 5 MG PO TABS Oral Take 5 mg by mouth daily.      Marland Kitchen ROSUVASTATIN CALCIUM 20 MG PO TABS Oral Take 20 mg by mouth daily.      . SERTRALINE HCL 50 MG PO TABS Oral Take 50 mg by mouth daily.        BP 123/67  Pulse 88  Temp(Src) 98.6 F (37 C) (Oral)  Resp 15  SpO2 100%  Physical Exam  Nursing note and vitals reviewed. Constitutional: She is oriented to person, place, and time. She appears well-developed and well-nourished.  HENT:  Head: Normocephalic.  Mouth/Throat: No oropharyngeal exudate.  Eyes: Conjunctivae and EOM are normal. Pupils are equal, round, and reactive to light.  Neck: Normal range of motion. Neck supple.  Cardiovascular: Normal rate, regular rhythm, normal heart sounds and intact distal pulses.  Exam reveals no gallop and no friction rub.   No murmur heard. Pulmonary/Chest: Effort normal and breath sounds normal. No respiratory distress. She has no wheezes.  Abdominal: Soft. Bowel sounds are normal. She exhibits no distension. There is no tenderness. There is no rebound and no guarding.  Musculoskeletal: Normal range of motion. She exhibits no edema and no tenderness.  Neurological: She is alert and oriented to person, place, and time. She has normal strength. No cranial nerve deficit or sensory deficit. Coordination normal.       No pronator drift. No truncal ataxia noted.   Skin: Skin is warm and dry.  Psychiatric: She has a normal mood and affect. Her behavior is normal.    ED Course  Procedures (including critical care time)  Labs Reviewed - No data to display No results found.   No diagnosis found.   Date: 12/01/2011  Rate: 90  Rhythm: normal sinus rhythm  QRS Axis: normal  Intervals: QT prolonged  ST/T Wave abnormalities: nonspecific T wave changes   Conduction Disutrbances:none  Narrative Interpretation: Flipped T wave in III also seen on previous, No ST or T wave changes cw ischemia  Old EKG Reviewed: unchanged    MDM  4:05 PM 78 y.o. female w hx of NSTEMI, TIA's pw slurring of speech which was first noted yesterday. Pt notes diffuse mild weakness today and difficulty ambulating w/ walker. Family saw her at 2pm today and noted worsening slurring of  speech. Pt AFVSS here, A/O x3, mild slurring of speech, no other neurologic deficits noted on exam. Will get CT head and pursue infectious work up.  Will admit to [facility] for TIA workup.   Clinical Impression 1. TIA (transient ischemic attack)   2. Slurring of speech          Purvis Sheffield, MD 12/01/11 2325

## 2011-12-01 NOTE — ED Notes (Signed)
Called to give report- nurses in report unable to receive report.

## 2011-12-01 NOTE — ED Notes (Signed)
Patient transported to X-ray 

## 2011-12-02 ENCOUNTER — Inpatient Hospital Stay (HOSPITAL_COMMUNITY): Payer: Medicare Other

## 2011-12-02 DIAGNOSIS — I6350 Cerebral infarction due to unspecified occlusion or stenosis of unspecified cerebral artery: Secondary | ICD-10-CM

## 2011-12-02 LAB — LIPID PANEL
Cholesterol: 143 mg/dL (ref 0–200)
HDL: 50 mg/dL (ref >39)
LDL Cholesterol: 37 mg/dL (ref 0–99)
Triglycerides: 280 mg/dL — ABNORMAL HIGH (ref <150)

## 2011-12-02 LAB — COMPREHENSIVE METABOLIC PANEL
ALT: 15 U/L (ref 0–35)
Albumin: 3.6 g/dL (ref 3.5–5.2)
Alkaline Phosphatase: 47 U/L (ref 39–117)
Potassium: 4.4 mEq/L (ref 3.5–5.1)
Sodium: 141 mEq/L (ref 135–145)
Total Protein: 6.4 g/dL (ref 6.0–8.3)

## 2011-12-02 LAB — CBC
MCHC: 33.1 g/dL (ref 30.0–36.0)
RDW: 14.5 % (ref 11.5–15.5)

## 2011-12-02 LAB — URINE CULTURE
Colony Count: NO GROWTH
Culture  Setup Time: 201306031709

## 2011-12-02 LAB — TSH: TSH: 0.742 u[IU]/mL (ref 0.350–4.500)

## 2011-12-02 MED ORDER — SERTRALINE HCL 50 MG PO TABS
50.0000 mg | ORAL_TABLET | Freq: Every day | ORAL | Status: DC
  Administered 2011-12-02 – 2011-12-08 (×6): 50 mg via ORAL
  Filled 2011-12-02 (×8): qty 1

## 2011-12-02 MED ORDER — SERTRALINE HCL 50 MG PO TABS: 50.0000 mg | ORAL_TABLET | Freq: Every day | ORAL | Status: DC

## 2011-12-02 NOTE — Evaluation (Signed)
Physical Therapy Evaluation Patient Details Name: [name] MRN: [id number] DOB: 1929/08/07 Today's Date: 12/02/2011 Time: 8295-6213 PT Time Calculation (min): 36 min  PT Assessment / Plan / Recommendation Clinical Impression  Pt is a 78 y.o. female admitted s/p TIA recently diagnosed as L CVA as well as PT problem list below. Pt is displaying weakness in both  LE with weakness in R > L. Pt would benefit from continued PT to maximize independence to facilitate a safe D/C to SNF.     PT Assessment  Patient needs continued PT services    Follow Up Recommendations  Skilled nursing facility    Barriers to Discharge Decreased caregiver support Lives alone.     lEquipment Recommendations  Defer to next venue    Recommendations for Other Services     Frequency Min 4X/week    Precautions / Restrictions Precautions Precautions: Fall Restrictions Weight Bearing Restrictions: No   Pertinent Vitals/Pain N/A      Mobility  Bed Mobility Bed Mobility: Supine to Sit;Sitting - Scoot to Edge of Bed Supine to Sit: 3: Mod assist;HOB elevated;With rails Sitting - Scoot to Edge of Bed: 3: Mod assist Details for Bed Mobility Assistance: Assist for balance and to translate trunk anterior. Assist with pad to bring legs around to EOB. Cues for sequence and safe hand placement.  Transfers Transfers: Sit to Stand;Stand to Sit Sit to Stand: 1: +2 Total assist;With upper extremity assist;From bed Sit to Stand: Patient Percentage: 70% Stand to Sit: 4: Min assist;To chair/3-in-1;With upper extremity assist;With armrests Details for Transfer Assistance: Assist to bring body weight over BOS. +2 for safety. Cues for sequence and safe hand and LE placement.  Ambulation/Gait Ambulation/Gait Assistance: 4: Min assist Ambulation Distance (Feet): 15 Feet Assistive device: Rolling walker Ambulation/Gait Assistance Details: Assist for balance. Verbal and tactile cues for good posture. Cues to stay inside  the walker while ambulating, requiring a foot stop by PT for safe RW placement.  Gait Pattern: Shuffle Stairs: No Wheelchair Mobility Wheelchair Mobility: No    Exercises     PT Diagnosis: Difficulty walking;Generalized weakness  PT Problem List: Decreased strength;Decreased activity tolerance;Decreased balance;Decreased mobility;Decreased coordination;Decreased knowledge of use of DME PT Treatment Interventions: DME instruction;Gait training;Functional mobility training;Therapeutic activities;Therapeutic exercise;Balance training;Patient/family education   PT Goals Acute Rehab PT Goals PT Goal Formulation: With patient Time For Goal Achievement: 12/16/11 Potential to Achieve Goals: Good Pt will go Supine/Side to Sit: with supervision PT Goal: Supine/Side to Sit - Progress: Goal set today Pt will go Sit to Supine/Side: with supervision PT Goal: Sit to Supine/Side - Progress: Goal set today Pt will go Sit to Stand: with supervision PT Goal: Sit to Stand - Progress: Goal set today Pt will go Stand to Sit: with supervision PT Goal: Stand to Sit - Progress: Goal set today Pt will Ambulate: 51 - 150 feet;with supervision;with rolling walker PT Goal: Ambulate - Progress: Goal set today  Visit Information  Last PT Received On: 12/02/11 Assistance Needed: +1    Subjective Data  Subjective: "I didn't sleep at all last night." Patient Stated Goal: Go home eventually.   Prior Functioning  Home Living Lives With: Alone Type of Home: House Home Access: Level entry Home Layout: One level Bathroom Shower/Tub: Tub/shower unit;Curtain Firefighter: Standard Bathroom Accessibility: Yes Home Adaptive Equipment: Bedside commode/3-in-1;Walker - rolling Prior Function Level of Independence: Independent with assistive device(s) Able to Take Stairs?: No Driving: No Vocation: Retired Musician: No difficulties Dominant Hand: Right  Cognition  Overall Cognitive  Status: Appears within functional limits for tasks assessed/performed Arousal/Alertness: Awake/alert Orientation Level: Oriented X4 / Intact Behavior During Session: WFL for tasks performed    Extremity/Trunk Assessment Right Upper Extremity Assessment RUE ROM/Strength/Tone: Within functional levels RUE Sensation: WFL - Light Touch RUE Coordination: WFL - gross/fine motor Left Upper Extremity Assessment LUE ROM/Strength/Tone: Within functional levels LUE Sensation: WFL - Light Touch LUE Coordination: WFL - gross/fine motor Right Lower Extremity Assessment RLE ROM/Strength/Tone: Deficits RLE ROM/Strength/Tone Deficits: 3+/5 throughout RLE Sensation: WFL - Light Touch RLE Coordination: WFL - gross/fine motor Left Lower Extremity Assessment LLE ROM/Strength/Tone: Deficits LLE ROM/Strength/Tone Deficits: 4/5 throughout. 3/5 for ant tib LLE Sensation: WFL - Light Touch LLE Coordination: WFL - gross/fine motor Trunk Assessment Trunk Assessment: Kyphotic   Balance Balance Balance Assessed: No  End of Session PT - End of Session Equipment Utilized During Treatment: Gait belt Activity Tolerance: Patient tolerated treatment well;Patient limited by fatigue Patient left: in chair;with call bell/phone within reach Nurse Communication: Mobility status   [name] 12/02/2011, 1:43 PM

## 2011-12-02 NOTE — Evaluation (Signed)
Occupational Therapy Evaluation Patient Details Name: [name] MRN: [id number] DOB: 04-Apr-1930 Today's Date: 12/02/2011 Time: 4098-1191 OT Time Calculation (min): 33 min  OT Assessment / Plan / Recommendation Clinical Impression  This 78 y.o. female admitted with new onset CVA.  MRI revealed Acute small non hemorrhagic infarct mid body left corpus.  Pt. demonstrates the below listed deficits and will benefit from OT to maximize safety and independence with BADLs to allow pt. to return to supervision to min A level after SNF level rehab    OT Assessment  Patient needs continued OT Services    Follow Up Recommendations  Supervision/Assistance - 24 hour;Skilled nursing facility    Barriers to Discharge Decreased caregiver support    Equipment Recommendations  Defer to next venue    Recommendations for Other Services    Frequency  Min 2X/week    Precautions / Restrictions Precautions Precautions: Fall Restrictions Weight Bearing Restrictions: No       ADL  Eating/Feeding: Simulated;Set up Where Assessed - Eating/Feeding: Chair Grooming: Simulated;Wash/dry hands;Wash/dry face;Supervision/safety Where Assessed - Grooming: Supported sitting Upper Body Bathing: Simulated;Minimal assistance Where Assessed - Upper Body Bathing: Supported sitting Lower Body Bathing: Simulated;Moderate assistance Where Assessed - Lower Body Bathing: Supported sit to stand Upper Body Dressing: Simulated;Minimal assistance Where Assessed - Upper Body Dressing: Unsupported sitting Lower Body Dressing: Simulated;Moderate assistance (Able to don/doff socks) Where Assessed - Lower Body Dressing: Sopported sit to stand Toilet Transfer: Performed;Minimal assistance Toilet Transfer Method: Stand pivot Toilet Transfer Equipment: Bedside commode Toileting - Clothing Manipulation and Hygiene: Performed;Moderate assistance Where Assessed - Toileting Clothing Manipulation and Hygiene:  Standing Transfers/Ambulation Related to ADLs: Pt. requires min A and increased time for transfers.  Demonstrates flexed posture with lateral flexion to Rt.  pt. requires step by step instruction for transfer ADL Comments: Pt requires encouragement to do as much for self as possible    OT Diagnosis: Generalized weakness;Cognitive deficits  OT Problem List: Decreased strength;Decreased activity tolerance;Impaired balance (sitting and/or standing);Decreased coordination;Decreased cognition;Decreased knowledge of use of DME or AE OT Treatment Interventions: Self-care/ADL training;Neuromuscular education;DME and/or AE instruction;Therapeutic activities;Cognitive remediation/compensation;Patient/family education;Balance training   OT Goals Acute Rehab OT Goals OT Goal Formulation: With patient Time For Goal Achievement: 12/09/11 Potential to Achieve Goals: Good ADL Goals Pt Will Perform Grooming: with min assist;Standing at sink;Supported ADL Goal: Grooming - Progress: Goal set today Pt Will Perform Upper Body Bathing: with supervision;Sitting, chair ADL Goal: Upper Body Bathing - Progress: Goal set today Pt Will Perform Lower Body Bathing: with min assist;Sit to stand from chair ADL Goal: Lower Body Bathing - Progress: Goal set today Pt Will Transfer to Toilet: with min assist;Ambulation;Comfort height toilet ADL Goal: Toilet Transfer - Progress: Goal set today Pt Will Perform Toileting - Clothing Manipulation: Standing (min guard assist) ADL Goal: Toileting - Clothing Manipulation - Progress: Goal set today Pt Will Perform Toileting - Hygiene: with supervision;Sitting on 3-in-1 or toilet ADL Goal: Toileting - Hygiene - Progress: Goal set today  Visit Information  Last OT Received On: 12/02/11 Assistance Needed: +1    Subjective Data  Subjective: "I think you're going to have to help me."  re:  LB ADLs Patient Stated Goal: To regain indpendence   Prior Functioning  Home Living Lives  With: Alone Type of Home: House Home Access: Level entry Home Layout: One level Bathroom Shower/Tub: Tub/shower unit;Curtain Firefighter: Standard Bathroom Accessibility: Yes Home Adaptive Equipment: Bedside commode/3-in-1;Walker - rolling Additional Comments: dtr reports that they may investigate assisted living  after SNF level rehab Prior Function Level of Independence: Independent with assistive device(s) Able to Take Stairs?: No Driving: No Vocation: Retired Comments: ambulated with RW  Communication Communication:  (mild dysarthria) Dominant Hand: Right    Cognition  Overall Cognitive Status: Impaired Area of Impairment: Attention;Problem solving;Memory Arousal/Alertness: Awake/alert Orientation Level: Oriented X4 / Intact Behavior During Session: WFL for tasks performed Current Attention Level: Selective (with min verbal cues) Memory:  (pt. mixing up current events with past) Problem Solving: min cues for basic problem solving    Extremity/Trunk Assessment Right Upper Extremity Assessment RUE ROM/Strength/Tone: Within functional levels RUE Sensation: WFL - Light Touch RUE Coordination: WFL - gross/fine motor Left Upper Extremity Assessment LUE ROM/Strength/Tone: Deficits LUE ROM/Strength/Tone Deficits: Pt. reports history of Lt. shoulder fracture limiting shoulder elevation LUE Sensation: WFL - Light Touch LUE Coordination: WFL - gross/fine motor Trunk Assessment Trunk Assessment: Kyphotic (passive left lateral flexion)   Mobility Transfers Transfers: Sit to Stand;Stand to Sit Sit to Stand: 4: Min assist;From chair/3-in-1;With upper extremity assist Stand to Sit: 4: Min assist;To chair/3-in-1;With upper extremity assist   Exercise    Balance    End of Session OT - End of Session Activity Tolerance: Patient tolerated treatment well Patient left: in chair;with call bell/phone within reach;with family/visitor present Nurse Communication: Mobility status    Alliene Klugh, Ursula Alert M 12/02/2011, 4:06 PM

## 2011-12-02 NOTE — Progress Notes (Signed)
Subjective: Patient seems well this morning. She evidently has some slurred speech upon admission which was not perceived by my admitting partner. At this point she is clearly at baseline. Claims she has not slept well last evening. He had a workup is pending at this point.  Objective: Vital signs in last 24 hours: Temp:  [97.9 F (36.6 C)-98.6 F (37 C)] 97.9 F (36.6 C) (06/04 0603) Pulse Rate:  [57-88] 81  (06/04 0603) Resp:  [15-20] 16  (06/04 0603) BP: (123-171)/(61-89) 150/78 mmHg (06/04 0638) SpO2:  [94 %-100 %] 95 % (06/04 0603) Weight:  [68.08 kg (150 lb 1.4 oz)-68.085 kg (150 lb 1.6 oz)] 68.08 kg (150 lb 1.4 oz) (06/03 2321) Weight change:   CBG (last 3)   Basename 12/01/11 1625  GLUCAP 142*    Intake/Output from previous day:    Physical Exam: Patient is awake alert no distress. Good facial symmetry. Cranial nerves intact. Neck no JVD or bruits. Lungs are clear. Cardiovascular exam regular rate and rhythm no murmur. Abdomen is soft nontender benign. Extremities without edema. Neurologically higher cortical functioning is intact. Cranial nerves intact. Motor is 5 out of 5 no pronator drift. Gait was not assessed   Lab Results:  [facility] 12/01/11 1622  NA 140  K 3.4*  CL 102  CO2 27  GLUCOSE 154*  BUN 38*  CREATININE 1.77*  CALCIUM 9.4  MG --  PHOS --    Basename 12/01/11 1622  AST 20  ALT 15  ALKPHOS 52  BILITOT 0.4  PROT 6.9  ALBUMIN 3.8    Basename 12/02/11 0655 12/01/11 1622  WBC 9.5 11.4*  NEUTROABS -- 7.7  HGB 11.4* 11.7*  HCT 34.4* 35.4*  MCV 88.7 88.1  PLT 168 174   Lab Results  Component Value Date   INR 1.03 12/01/2011   INR 1.00 05/11/2011   INR 0.92 07/06/2010   No results found for this basename: CKTOTAL:3,CKMB:3,CKMBINDEX:3,TROPONINI:3 in the last 72 hours No results found for this basename: TSH,T4TOTAL,FREET3,T3FREE,THYROIDAB in the last 72 hours No results found for this basename:  VITAMINB12:2,FOLATE:2,FERRITIN:2,TIBC:2,IRON:2,RETICCTPCT:2 in the last 72 hours  Studies/Results: Dg Chest 2 View  12/01/2011  *RADIOLOGY REPORT*  Clinical Data: Chest pain.  CHEST - 2 VIEW  Comparison: Chest CT 05/12/2011 and chest radiograph 05/10/2011  Findings:  Large hiatal hernia with air-fluid level appears similar to prior studies.  Lung volumes are low.  Pulmonary vascularity is normal. No airspace disease, effusion, or pneumothorax.  At least two thoracic spine compression fractures appear stable compared to a CT of November 2012. Thoracic and upper lumbar fixation hardware noted.  No acute bony abnormality identified.  IMPRESSION: 1.  No acute findings. 2.  Large hiatal hernia. 3.  Stable chronic thoracic spine compression deformities and spinal fixation hardware.  Original Report Authenticated By: Britta Mccreedy, M.D.   Ct Head Wo Contrast  12/01/2011  *RADIOLOGY REPORT*  Clinical Data: Slurred speech.  Stroke-like symptoms.  CT HEAD WITHOUT CONTRAST  Technique:  Contiguous axial images were obtained from the base of the skull through the vertex without contrast.  Comparison: Head CT 08/22/2007.  Findings: There is again mild cerebellar and mild - moderate cerebral atrophy with associated ex vacuo dilatation of the ventricular system.  Scattered throughout the deep and periventricular white matter of the cerebral hemispheres bilaterally there are patchy and confluent areas of decreased attenuation, most compatible with chronic microvascular ischemic disease. Today's study demonstrates no definite acute intracranial abnormality.  Specifically, no overt signs of acute/subacute cerebral  ischemia, no hemorrhage, no focal mass, mass effect, hydrocephalus or abnormal intra or extra-axial fluid collections. Well defined foci of low attenuation in the thalami bilaterally and in the left basal ganglia are compatible with old lacunar infarctions.  No acute displaced skull fractures are identified. Visualized  paranasal sinuses and mastoids are well pneumatized.  IMPRESSION: 1.  No acute intracranial abnormalities. 2.  Multiple old lacunar infarctions, extensive chronic microvascular ischemic disease of the white matter, and cerebral and cerebellar atrophy redemonstrated, as detailed above.  Original Report Authenticated By: Florencia Reasons, M.D.     Assessment/Plan: #1 probable TIA with baseline old lacunar infarcts arty restful aspirin intolerant to Plavix. MRI, echo carotids ordered and we await hopeful disposition later today if all can be done  #2 coronary artery disease with remote MI stable  #3 essential hypertension stable  #4 hyperlipidemia stable  #5 hypothyroidism stable  #6 rheumatoid arthritis on low-dose prednisone  Plan as long as the facility can coordinate the TIA workup noted above we should be able to make a disposition later today. There is likely very little else we can do beyond risk factor modification.   LOS: 1 day   Casha Estupinan A 12/02/2011, 8:06 AM

## 2011-12-02 NOTE — Progress Notes (Signed)
SLP Cancellation Note  Evaluation cancelled today due to patient receiving procedure or test.  Patient in MRI. Will f/u.   Ferdinand Lango MA, CCC-SLP [phone]   Ferdinand Lango [name] 12/02/2011, 9:55 AM

## 2011-12-02 NOTE — Evaluation (Signed)
Speech Language Pathology Evaluation Patient Details Name: [name] MRN: [id number] DOB: 1929-12-22 Today's Date: 12/02/2011 Time: 1435-1500 SLP Time Calculation (min): 25 min  Problem List:  Patient Active Problem List  Diagnoses  . SOB (shortness of breath)  . MI (myocardial infarction)  . Poor appetite  . Arthritis  . Fatigue  . Right ear pain  . Renal insufficiency  . RA (rheumatoid arthritis)  . PUD (peptic ulcer disease)  . GI bleed  . GERD (gastroesophageal reflux disease)  . Hiatal hernia  . Hypothyroidism  . Osteoporosis  . Depression  . Anxiety   Past Medical History:  Past Medical History  Diagnosis Date  . SOB (shortness of breath)   . MI (myocardial infarction)   . Poor appetite   . Arthritis   . Fatigue   . Right ear pain   . Renal insufficiency   . RA (rheumatoid arthritis)   . PUD (peptic ulcer disease)   . GI bleed 2005  . GERD (gastroesophageal reflux disease)   . Hiatal hernia   . Hypothyroidism   . Osteoporosis   . Depression   . Anxiety   . Coronary artery disease   . CVA (cerebral vascular accident)   . TIA (transient ischemic attack)    Past Surgical History:  Past Surgical History  Procedure Date  . Cardiac catheterization     SHOWED RUPTURE PLAQUE IN THE LAD. THE LAD IS NONOBSTRUCTIVE WITH ONLY 30-40% STENOSIS  . Nstemi 06/2010  . Spinal fusion surgery   . Knee surgery    HPI:  Patient is a 78 year old female who has had slurred speech for the past couple of days.  Her family considered tohe possibility that this was TIA as she has had in the past, but waited to see if it would improve.  It has not to the best of their knowledge, so she was brought to the [facility] ER, where assessment was nonacute by CT, she is not code stroke due to timing, and infection workup negative. MRI 6/4 indicated acute left corpus collosum infarct, and remote right frontal, right temproral, infereior left occipital, and small bilateral thalamic infarcts.      Assessment / Plan / Recommendation Clinical Impression  Pleasant 78 year old female presents with deficits in the areas of selective attention, complex problem solving, processing, and speech intelligibility s/p acute CVA. Will benefit from f/u SLP to address above areas. REcommend SNF after d/c as patient lives alone.     SLP Assessment  Patient needs continued Speech Lanaguage Pathology Services    Follow Up Recommendations  Skilled Nursing facility    Frequency and Duration min 2x/week  2 weeks   Pertinent Vitals/Pain n/a   SLP Goals  SLP Goals Potential to Achieve Goals: Good Progress/Goals/Alternative treatment plan discussed with pt/caregiver and they: Agree SLP Goal #1: Patient will utilize speech intelligiblity strategies at the conversation level with min verbal cues.   SLP Goal #1 - Progress: Not met SLP Goal #2: Patient will demonstrate functional problem solving for moderately complex ADLs with supervision cues.  SLP Goal #2 - Progress: Not met SLP Goal #3: Patient will demonstrate selective attention to moderately complex functional ADL for 15 minutes with supervision cues to redirect.  SLP Goal #3 - Progress: Not met  SLP Evaluation Prior Functioning  Cognitive/Linguistic Baseline: Within functional limits Type of Home: House Lives With: Alone Vocation: Retired   IT consultant  Overall Cognitive Status: Impaired Arousal/Alertness: Awake/alert Orientation Level: Oriented X4 Attention:  Focused;Sustained;Selective Focused Attention: Appears intact Sustained Attention: Appears intact Selective Attention: Impaired Selective Attention Impairment: Verbal basic;Functional basic Memory: Appears intact Awareness: Appears intact Problem Solving: Impaired Problem Solving Impairment: Functional complex Executive Function: Self Monitoring Self Monitoring: Impaired Self Monitoring Impairment: Functional complex Safety/Judgment: Appears intact    Comprehension   Auditory Comprehension Overall Auditory Comprehension: Appears within functional limits for tasks assessed Visual Recognition/Discrimination Discrimination: Within Function Limits Reading Comprehension Reading Status: Within funtional limits    Expression Expression Primary Mode of Expression: Verbal Verbal Expression Overall Verbal Expression: Appears within functional limits for tasks assessed Written Expression Dominant Hand: Right Written Expression: Not tested   Oral / Motor Oral Motor/Sensory Function Overall Oral Motor/Sensory Function: Impaired Labial ROM: Within Functional Limits Labial Symmetry: Within Functional Limits Labial Strength: Within Functional Limits Labial Sensation: Within Functional Limits Lingual ROM: Other (Comment) (decreaed coordination) Lingual Symmetry: Within Functional Limits Lingual Strength: Within Functional Limits Lingual Sensation: Within Functional Limits Facial ROM: Within Functional Limits Facial Symmetry: Within Functional Limits Facial Strength: Within Functional Limits Facial Sensation: Within Functional Limits Velum: Within Functional Limits Mandible: Impaired (decreased ROM; decreased depression during speech ) Motor Speech Overall Motor Speech: Impaired Respiration: Within functional limits Phonation: Normal Resonance: Within functional limits Articulation: Impaired Level of Impairment: Word Intelligibility: Intelligibility reduced Word: 75-100% accurate Phrase: 75-100% accurate Sentence: 75-100% accurate Conversation: 75-100% accurate   Ferdinand Lango MA, CCC-SLP [phone]   [name] 12/02/2011, 3:18 PM

## 2011-12-02 NOTE — Discharge Instructions (Signed)
STROKE/TIA DISCHARGE INSTRUCTIONS SMOKING Cigarette smoking nearly doubles your risk of having a stroke & is the single most alterable risk factor  If you smoke or have smoked in the last 12 months, you are advised to quit smoking for your health.  Most of the excess cardiovascular risk related to smoking disappears within a year of stopping.  Ask you doctor about anti-smoking medications  [place] Quit Line: 1-800-QUIT NOW  Free Smoking Cessation Classes [phone]  CHOLESTEROL Know your levels; limit fat & cholesterol in your diet  Lipid Panel     Component Value Date/Time   CHOL 160 05/11/2011 0414   TRIG 292* 05/11/2011 0414   HDL 52 05/11/2011 0414   CHOLHDL 3.1 05/11/2011 0414   VLDL 58* 05/11/2011 0414   LDLCALC 50 05/11/2011 0414      Many patients benefit from treatment even if their cholesterol is at goal.  Goal: Total Cholesterol (CHOL) less than 160  Goal:  Triglycerides (TRIG) less than 150  Goal:  HDL greater than 40  Goal:  LDL (LDLCALC) less than 100   BLOOD PRESSURE American Stroke Association blood pressure target is less that 120/80 mm/Hg  Your discharge blood pressure is:  BP: 150/78 mmHg  Monitor your blood pressure  Limit your salt and alcohol intake  Many individuals will require more than one medication for high blood pressure  DIABETES (A1c is a blood sugar average for last 3 months) Goal HGBA1c is under 7% (HBGA1c is blood sugar average for last 3 months)  Diabetes: {STROKE DC DIABETES:22357}    No results found for this basename: HGBA1C     Your HGBA1c can be lowered with medications, healthy diet, and exercise.  Check your blood sugar as directed by your physician  Call your physician if you experience unexplained or low blood sugars.  PHYSICAL ACTIVITY/REHABILITATION Goal is 30 minutes at least 4 days per week    {STROKE DC ACTIVITY/REHAB:22359}  Activity decreases your risk of heart attack and stroke and makes your heart stronger.  It  helps control your weight and blood pressure; helps you relax and can improve your mood.  Participate in a regular exercise program.  Talk with your doctor about the best form of exercise for you (dancing, walking, swimming, cycling).  DIET/WEIGHT Goal is to maintain a healthy weight  Your discharge diet is: Cardiac *** liquids Your height is:  Height: 5\' 6"  (167.6 cm) Your current weight is: Weight: 68.08 kg (150 lb 1.4 oz) Your Body Mass Index (BMI) is:  BMI (Calculated): 24.3   Following the type of diet specifically designed for you will help prevent another stroke.  Your goal weight range is:  ***  Your goal Body Mass Index (BMI) is 19-24.  Healthy food habits can help reduce 3 risk factors for stroke:  High cholesterol, hypertension, and excess weight.  RESOURCES Stroke/Support Group:  Call [phone]  they meet the 3rd Sunday of the month on the Rehab Unit at [facility] [place], [place] ( no meetings June, July & Aug).  STROKE EDUCATION PROVIDED/REVIEWED AND GIVEN TO PATIENT Stroke warning signs and symptoms How to activate emergency medical system (call 911). Medications prescribed at discharge. Need for follow-up after discharge. Personal risk factors for stroke. Pneumonia vaccine given:   {STROKE DC YES/NO/DATE:22363} Flu vaccine given:   {STROKE DC YES/NO/DATE:22363} My questions have been answered, the writing is legible, and I understand these instructions.  I will adhere to these goals & educational materials that have been provided to me  after my discharge from the hospital.

## 2011-12-02 NOTE — Clinical Social Work Psychosocial (Signed)
     Clinical Social Work Department BRIEF PSYCHOSOCIAL ASSESSMENT 12/02/2011  Patient:  [name], [name]     Account Number:  [id number]     Admit date:  12/01/2011  Clinical Social Worker:  Peggyann Shoals  Date/Time:  12/02/2011 03:30 PM  Referred by:  Physician  Date Referred:  12/02/2011 Referred for  Other - See comment   Other Referral:   Initial consult was for "Home Needs."  However, PT is recommending SNF at discharge.   Interview type:  Family Other interview type:    PSYCHOSOCIAL DATA Living Status:  ALONE Admitted from facility:   Level of care:   Primary support name:  Cathlean Cower May Primary support relationship to patient:  CHILD, ADULT Degree of support available:   Very supportive. [phone] and [phone].    CURRENT CONCERNS Current Concerns  Post-Acute Placement   Other Concerns:    SOCIAL WORK ASSESSMENT / PLAN CSW met with pt to address consult and discharge recommendations. CSW introduced herself and explained role of social work. PT is currently recommending ST-SNF at discharge. Pt lives alone and has a supportive daughter. Pt reported that she is has been to SNF in the past and is agreeable to discharge plan. Pt's daughter inquired about the possibility of ALFs in the future. Pt has applied for VA benefits and has a pending VA application to assist in paying for the ALF. CSW also explained the process of applying for Medicaid.    CSW will initate SNF referral to [facility] and follow with bed offers. CSW will continue to follow.   Assessment/plan status:  Psychosocial Support/Ongoing Assessment of Needs Other assessment/ plan:   Information/referral to community resources:    PATIENTS/FAMILYS RESPONSE TO PLAN OF CARE: Pt was pleasant and alert and oriented. Pt agreeable to discharge to SNF. Pt's daughter is agreeable as well.

## 2011-12-02 NOTE — Evaluation (Signed)
Agree with evaluation and goals.  Pt pre-morbid Rankin no symptoms with current Modified Rankin Moderately sever disability.  To follow.  12/02/2011 Cephus Shelling, PT, DPT [phone]

## 2011-12-02 NOTE — Progress Notes (Signed)
Called by Rankin Sink of the stroke service, was told that MD needs to order ischemic stroke order set. MD Jacky Kindle called and told stroke service's request; declined ordering the ischemic stroke order set. Charge RN Warden Fillers made aware and asked to call Raymond Sink of stroke service back. Will cont to monitor patient.  Minor, Yvette Rack

## 2011-12-02 NOTE — Clinical Social Work Placement (Addendum)
    Clinical Social Work Department CLINICAL SOCIAL WORK PLACEMENT NOTE 12/02/2011  Patient:  Samantha, [name]  Account Number:  [id number] Admit date:  12/01/2011  Clinical Social Worker:  Peggyann Shoals  Date/time:  12/02/2011 01:30 PM  Clinical Social Work is seeking post-discharge placement for this patient at the following level of care:   SKILLED NURSING   (*CSW will update this form in Epic as items are completed)   12/02/2011  Patient/family provided with [facility] Department of Clinical Social Work's list of facilities offering this level of care within the geographic area requested by the patient (or if unable, by the patient's family).  12/02/2011  Patient/family informed of their freedom to choose among providers that offer the needed level of care, that participate in Medicare, Medicaid or managed care program needed by the patient, have an available bed and are willing to accept the patient.  12/02/2011  Patient/family informed of [facility]' ownership interest in [facility], as well as of the fact that they are under no obligation to receive care at this facility.  PASARR submitted to EDS on 12/02/2011 PASARR number received from EDS on 12/02/2011  FL2 transmitted to all facilities in geographic area requested by pt/family on  12/02/2011 FL2 transmitted to all facilities within larger geographic area on   Patient informed that his/her managed care company has contracts with or will negotiate with  certain facilities, including the following:     Patient/family informed of bed offers received: 12/03/2011 Patient chooses bed at [facility] Physician recommends and patient chooses bed at  [facility]  Patient to be transferred to [facility] on 12/08/2011 Patient to be transferred to facility by [facility]  The following physician request were entered in Epic:   Additional Comments:

## 2011-12-02 NOTE — Progress Notes (Signed)
VASCULAR LAB PRELIMINARY  PRELIMINARY  PRELIMINARY  PRELIMINARY  Carotid duplex completed.    Preliminary report:  Bilateral:  No evidence of hemodynamically significant internal carotid artery stenosis.   Vertebral artery flow is antegrade.     Gavino Fouch D, RVS 12/02/2011, 5:14 PM

## 2011-12-02 NOTE — Care Management Note (Signed)
    Page 1 of 2   12/09/2011     4:27:51 PM   CARE MANAGEMENT NOTE 12/09/2011  Patient:  DELITHA, ELMS   Account Number:  [id number]  Date Initiated:  12/02/2011  Documentation initiated by:  Onnie Boer  Subjective/Objective Assessment:   PT WAS ADMITTED WITH WEAKNESS AND SLURRED SPEECH     Action/Plan:   PROGRESSION OF CARE AND DISCHARGE PLANNING   Anticipated DC Date:  12/04/2011   Anticipated DC Plan:  SKILLED NURSING FACILITY  In-house referral  Clinical Social Worker      DC Planning Services  CM consult      Choice offered to / List presented to:             Status of service:  Completed, signed off Medicare Important Message given?  YES (If response is "NO", the following Medicare IM given date fields will be blank) Date Medicare IM given:  12/04/2011 Date Additional Medicare IM given:    Discharge Disposition:  SKILLED NURSING FACILITY  Per UR Regulation:  Reviewed for med. necessity/level of care/duration of stay  If discussed at Long Length of Stay Meetings, dates discussed:    Comments:  12/09/11 Onnie Boer, RN, BSN 1626 PT WAS DC'D TO [facility]  12/05/11 Onnie Boer, RN ,BSN 1713 PT HAS A CHANGE IN STATUS.  ATTENDING HAS ORDERED AN MRI AND RETRACTED THE DC ORDER.  WILL F/U AFTER TESTS AND EXAM OF PT.  12/04/11 Onnie Boer, RN, BSN 1700 PT AND FAMILY HAS STARTED THE APPEAL PROCESS AND IS WAITING FOR A REPLY.  WILL F/U ON DC PLANS.  12/02/11 Onnie Boer, RN, BSN 1412 PTA PT WAS AT HOME WITH SELF CARE.  PT RECOMMENDATIONS IS FOR SNF.  WILL F/U ON RECOMMENDATION AND DC PLANS.

## 2011-12-02 NOTE — Progress Notes (Signed)
Jane in radiology called with MRI result as follows: small acute non hemorrhagic infarct mid body left corpus collosum and adjacent singulate gyrus. Report called to MD Jacky Kindle. No orders at this time.  Minor, Yvette Rack

## 2011-12-03 MED ORDER — ASPIRIN 325 MG PO TBEC: 325.0000 mg | DELAYED_RELEASE_TABLET | Freq: Every day | ORAL | Status: AC

## 2011-12-03 MED ORDER — HYDROCODONE-ACETAMINOPHEN 5-325 MG PO TABS: 1.0000 | ORAL_TABLET | ORAL | Status: AC | PRN

## 2011-12-03 MED ORDER — DIAZEPAM 5 MG PO TABS: 5.0000 mg | ORAL_TABLET | Freq: Every day | ORAL | Status: AC

## 2011-12-03 NOTE — Progress Notes (Signed)
Agree with treatment session.  12/03/2011 Chemeka Filice M Willem Klingensmith, PT, DPT [phone]   

## 2011-12-03 NOTE — Progress Notes (Signed)
Physical Therapy Treatment Patient Details Name: [name] MRN: [id number] DOB: 1930-01-28 Today's Date: 12/03/2011 Time: 4098-1191 PT Time Calculation (min): 37 min  PT Assessment / Plan / Recommendation Comments on Treatment Session  Pt is s/p L CVA and progressing well. Tolerated greater ambulation distance as well as 3x sit to stand activities. Pt would continue to benefit from PT to facilitate a safe D/C to SNF.     Follow Up Recommendations  Skilled nursing facility    Barriers to Discharge        Equipment Recommendations  Defer to next venue    Recommendations for Other Services    Frequency Min 4X/week   Plan Discharge plan remains appropriate;Frequency remains appropriate    Precautions / Restrictions Precautions Precautions: Fall Restrictions Weight Bearing Restrictions: No   Pertinent Vitals/Pain N/A    Mobility  Bed Mobility Bed Mobility: Supine to Sit;Sitting - Scoot to Edge of Bed Supine to Sit: 4: Min assist Sitting - Scoot to [place] of Bed: 4: Min assist Details for Bed Mobility Assistance: Assist for balance and to translate trunk anterior as well as scoot to EOB with use of pad. Cues for sequence and safe hand placement.  Transfers Transfers: Sit to Stand;Stand to Sit (Trials x 3) Sit to Stand: 4: Min assist;From bed;From chair/3-in-1;With armrests;With upper extremity assist Stand to Sit: 4: Min assist;To chair/3-in-1;With armrests;With upper extremity assist Details for Transfer Assistance: Assist for balance and to translate weight over BOS. Cues for safe hand placement.  Ambulation/Gait Ambulation/Gait Assistance: 4: Min assist Ambulation Distance (Feet): 20 Feet Assistive device: Rolling walker Ambulation/Gait Assistance Details: Assist for balance. Cues for tall posture and to stand inside walker with each step.  Gait Pattern: Shuffle Stairs: No Wheelchair Mobility Wheelchair Mobility: No Modified Rankin (Stroke Patients Only) Pre-Morbid  Rankin Score: No symptoms Modified Rankin: Moderately severe disability    Exercises     PT Diagnosis:    PT Problem List:   PT Treatment Interventions:     PT Goals Acute Rehab PT Goals PT Goal Formulation: With patient Time For Goal Achievement: 12/16/11 Potential to Achieve Goals: Good PT Goal: Supine/Side to Sit - Progress: Progressing toward goal PT Goal: Sit to Stand - Progress: Progressing toward goal PT Goal: Stand to Sit - Progress: Progressing toward goal PT Goal: Ambulate - Progress: Progressing toward goal  Visit Information  Last PT Received On: 12/03/11 Assistance Needed: +1    Subjective Data  Subjective: "I need to go to the bathroom." Patient Stated Goal: Go home eventually.   Cognition  Overall Cognitive Status: Appears within functional limits for tasks assessed/performed Arousal/Alertness: Awake/alert Orientation Level: Oriented X4 / Intact Behavior During Session: [name] for tasks performed    Balance  Balance Balance Assessed: No  End of Session PT - End of Session Equipment Utilized During Treatment: Gait belt Activity Tolerance: Patient tolerated treatment well;Patient limited by fatigue Patient left: in chair;with call bell/phone within reach Nurse Communication: Mobility status    [name] 12/03/2011, 2:49 PM

## 2011-12-03 NOTE — Progress Notes (Signed)
OT Cancellation Note  Treatment cancelled today due to Pt. speaking with insurance company.  Will reattempt today as able, or will try tomorrow.  Boykin Reaper [phone] 12/03/2011, 4:39 PM

## 2011-12-03 NOTE — Discharge Summary (Signed)
DISCHARGE SUMMARY  [name]  MR#: [id number]  DOB:1930/03/25  Date of Admission: 12/01/2011 Date of Discharge: 12/03/2011  Attending Physician:Marlei Glomski A  Patient's WUJ:WJXBJYN,WGNFAOZ A, MD, MD  Consults:  none  Discharge Diagnoses: #1 acute left subcortical lacunar infarct corpus callosum with dysarthria  #2 essential hypertension  #3 hyperlipidemia  #4 osteoarthritis  #5 rheumatoid arthritis on low-dose prednisone  #6 coronary artery disease with remote myocardial infarction  #7 chronic kidney disease stage II  #8 chronic depression  #9 anemia of chronic disease  #10 gastroesophageal reflux disease with remote GI bleed on chronic proton pump inhibitor   Discharge Medications: Medication List  As of 12/03/2011  6:50 AM   STOP taking these medications         aspirin 81 MG tablet      CALCIUM PO      HYDROcodone-acetaminophen 5-500 MG per tablet      imipramine 50 MG tablet         TAKE these medications         acetaminophen 500 MG tablet   Commonly known as: TYLENOL   Take 500 mg by mouth every 6 (six) hours as needed. For pain      aspirin 325 MG EC tablet   Take 1 tablet (325 mg total) by mouth daily.      Cholecalciferol 10000 UNITS Caps   Take 1,000 Units by mouth daily.      diazepam 5 MG tablet   Commonly known as: VALIUM   Take 1 tablet (5 mg total) by mouth at bedtime.      esomeprazole 40 MG capsule   Commonly known as: NEXIUM   Take 40 mg by mouth daily before breakfast.      Fish Oil 1200 MG Caps   Take 1,200 mg by mouth daily.      folic acid 1 MG tablet   Commonly known as: FOLVITE   Take 1 mg by mouth daily.      HYDROcodone-acetaminophen 5-325 MG per tablet   Commonly known as: NORCO   Take 1 tablet by mouth every 4 (four) hours as needed.      levothyroxine 50 MCG tablet   Commonly known as: SYNTHROID, LEVOTHROID   Take 50 mcg by mouth daily.      lisinopril-hydrochlorothiazide 20-12.5 MG per tablet   Commonly known as: PRINZIDE,ZESTORETIC   Take 1 tablet by mouth daily.      metoprolol 50 MG tablet   Commonly known as: LOPRESSOR   Take 50 mg by mouth 2 (two) times daily.      multivitamin tablet   Take 1 tablet by mouth daily.      predniSONE 5 MG tablet   Commonly known as: DELTASONE   Take 5 mg by mouth daily.      rosuvastatin 20 MG tablet   Commonly known as: CRESTOR   Take 20 mg by mouth daily.      sertraline 50 MG tablet   Commonly known as: ZOLOFT   Take 50 mg by mouth daily.      VITAMIN C PO   Take 500 mg by mouth daily.            Hospital Procedures: Dg Chest 2 View  12/01/2011  *RADIOLOGY REPORT*  Clinical Data: Chest pain.  CHEST - 2 VIEW  Comparison: Chest CT 05/12/2011 and chest radiograph 05/10/2011  Findings:  Large hiatal hernia with air-fluid level appears similar to prior studies.  Lung volumes are low.  Pulmonary vascularity is normal. No airspace disease, effusion, or pneumothorax.  At least two thoracic spine compression fractures appear stable compared to a CT of November 2012. Thoracic and upper lumbar fixation hardware noted.  No acute bony abnormality identified.  IMPRESSION: 1.  No acute findings. 2.  Large hiatal hernia. 3.  Stable chronic thoracic spine compression deformities and spinal fixation hardware.  Original Report Authenticated By: Britta Mccreedy, M.D.   Ct Head Wo Contrast  12/01/2011  *RADIOLOGY REPORT*  Clinical Data: Slurred speech.  Stroke-like symptoms.  CT HEAD WITHOUT CONTRAST  Technique:  Contiguous axial images were obtained from the base of the skull through the vertex without contrast.  Comparison: Head CT 08/22/2007.  Findings: There is again mild cerebellar and mild - moderate cerebral atrophy with associated ex vacuo dilatation of the ventricular system.  Scattered throughout the deep and periventricular white matter of the cerebral hemispheres bilaterally there are patchy and confluent areas of decreased attenuation, most  compatible with chronic microvascular ischemic disease. Today's study demonstrates no definite acute intracranial abnormality.  Specifically, no overt signs of acute/subacute cerebral ischemia, no hemorrhage, no focal mass, mass effect, hydrocephalus or abnormal intra or extra-axial fluid collections. Well defined foci of low attenuation in the thalami bilaterally and in the left basal ganglia are compatible with old lacunar infarctions.  No acute displaced skull fractures are identified. Visualized paranasal sinuses and mastoids are well pneumatized.  IMPRESSION: 1.  No acute intracranial abnormalities. 2.  Multiple old lacunar infarctions, extensive chronic microvascular ischemic disease of the white matter, and cerebral and cerebellar atrophy redemonstrated, as detailed above.  Original Report Authenticated By: Florencia Reasons, M.D.   Mr Maxine Glenn Head Wo Contrast  12/02/2011  *RADIOLOGY REPORT*  Clinical Data:  Slurred speech for the past 2 days.  MRI HEAD WITHOUT CONTRAST MRA HEAD WITHOUT CONTRAST  Technique: Multiplanar, multiecho pulse sequences of the brain and surrounding structures were obtained according to standard protocol without intravenous contrast.  Angiographic images of the head were obtained using MRA technique without contrast.  Comparison: 12/01/2011 CT.  06/11/2004 MR orbits.  MRI HEAD  Findings:  Acute small non hemorrhagic infarct mid body left corpus callosum and adjacent cingulate gyrus.  No intracranial hemorrhage.  Remote right frontal lobe infarct with encephalomalacia. Remote right periatrial/posterior right temporal lobe infarct.  Remote inferior left occipital lobe infarct.  Remote small bilateral thalamic infarcts.  Prominent small vessel disease type changes.  Global atrophy without hydrocephalus.  No intracranial mass lesion detected on this unenhanced exam.  IMPRESSION: Acute small non hemorrhagic infarct mid body left corpus callosum and adjacent cingulate gyrus.  Remote infarcts  and small vessel disease type changes as noted above.  Global atrophy without hydrocephalus.  MRA HEAD  Findings: Mild to moderate narrowing supraclinoid aspect right internal carotid artery.  Mild narrowing supraclinoid aspect left internal carotid artery.  Moderate to marked tandem stenoses A1 segment right anterior cerebral artery.  Moderate to marked focal stenosis mid aspect of the M1 segment of the right middle cerebral artery.  Mild to moderate tandem stenosis A1 segment left anterior cerebral artery.  Moderate to marked tandem stenosis M1 segment left middle cerebral artery.  Middle cerebral artery and the anterior cerebral artery moderate branch vessel irregularity.  Right vertebral artery is dominant.  Moderate to marked stenosis involving portions of the vertebral arteries bilaterally.  Moderate narrowing portions of the basilar artery.  Nonvisualization AICAs.  Mild to moderate narrowing PICAs.  Marked tandem stenoses posterior cerebral artery more  notable on the right.  No aneurysm or vascular malformation noted.  IMPRESSION: Significant intracranial atherosclerotic type changes as detailed above.  This has been made a PRA call report utilizing dashboard call feature.  Original Report Authenticated By: Fuller Canada, M.D.   Mr Brain Wo Contrast  12/02/2011  *RADIOLOGY REPORT*  Clinical Data:  Slurred speech for the past 2 days.  MRI HEAD WITHOUT CONTRAST MRA HEAD WITHOUT CONTRAST  Technique: Multiplanar, multiecho pulse sequences of the brain and surrounding structures were obtained according to standard protocol without intravenous contrast.  Angiographic images of the head were obtained using MRA technique without contrast.  Comparison: 12/01/2011 CT.  06/11/2004 MR orbits.  MRI HEAD  Findings:  Acute small non hemorrhagic infarct mid body left corpus callosum and adjacent cingulate gyrus.  No intracranial hemorrhage.  Remote right frontal lobe infarct with encephalomalacia. Remote right  periatrial/posterior right temporal lobe infarct.  Remote inferior left occipital lobe infarct.  Remote small bilateral thalamic infarcts.  Prominent small vessel disease type changes.  Global atrophy without hydrocephalus.  No intracranial mass lesion detected on this unenhanced exam.  IMPRESSION: Acute small non hemorrhagic infarct mid body left corpus callosum and adjacent cingulate gyrus.  Remote infarcts and small vessel disease type changes as noted above.  Global atrophy without hydrocephalus.  MRA HEAD  Findings: Mild to moderate narrowing supraclinoid aspect right internal carotid artery.  Mild narrowing supraclinoid aspect left internal carotid artery.  Moderate to marked tandem stenoses A1 segment right anterior cerebral artery.  Moderate to marked focal stenosis mid aspect of the M1 segment of the right middle cerebral artery.  Mild to moderate tandem stenosis A1 segment left anterior cerebral artery.  Moderate to marked tandem stenosis M1 segment left middle cerebral artery.  Middle cerebral artery and the anterior cerebral artery moderate branch vessel irregularity.  Right vertebral artery is dominant.  Moderate to marked stenosis involving portions of the vertebral arteries bilaterally.  Moderate narrowing portions of the basilar artery.  Nonvisualization AICAs.  Mild to moderate narrowing PICAs.  Marked tandem stenoses posterior cerebral artery more notable on the right.  No aneurysm or vascular malformation noted.  IMPRESSION: Significant intracranial atherosclerotic type changes as detailed above.  This has been made a PRA call report utilizing dashboard call feature.  Original Report Authenticated By: Fuller Canada, M.D.    History of Present Illness:  Patient is a 78 year old female who has had slurred speech for the past couple of days. Her family considered tohe possibility that this was TIA as she has had in the past, but waited to see if it would improve. It has not to the best of  their knowledge, so she was brought to the [facility] ER, where assessment was nonacute by CT, she is not code stroke due to timing, and infection workup negative. Only a bit dry per her renal indices.  She has slurred speech per family, but I am not familiar with her and she does not seem to have a considerable deficit. Requires admission for further workup. It is noted however that she is intolerant to Plavix (listed as allergy in her office chart)     Hospital Course: Reis is well-known to me with multiple medical problems as stated above and prior remote lacunar infarcts. Risk factors for the most part have been well managed and she has been on low-dose aspirin with a known intolerance to Plavix.  As a baseline she does live at home utilizing a walker and  has some assistance from her family which is local. She was admitted through the emergency room by my partner. Carotid Dopplers revealed no evidence of significant disease. Echocardiogram revealed normal left ventricular function no evidence of thrombotic foci CT scan demonstrated multiple old lacunar infarcts nothing acute and certainly no bleed. MRI/MRA revealed intracranial disease and a small left acute infarct subcortical adjacent to the corpus callosum. Her 2 deficits seem to be related to some mild dysarthria. She is generally weak and debilitated with no further focal findings have changed she has been evaluated by all rehabilitation modalities to include speech therapy, physical therapy and occupational therapy with indications that should benefit from 24-hour supervision and skilled nursing. She was aggressively hydrated in the front end and her aspirin advanced to a full dose with known intolerance to Plavix. She remained stable from a cardiovascular and hemodynamic standpoint with stable blood pressures. Risk factor modification was arty ongoing in the outpatient setting. She is discharged to short-term skilled nursing at this time for  rehabilitation. She is on a regular diet and stable.  Day of Discharge Exam BP 156/83  Pulse 80  Temp(Src) 98.6 F (37 C) (Oral)  Resp 18  Ht 5\' 6"  (1.676 m)  Wt 68.08 kg (150 lb 1.4 oz)  BMI 24.23 kg/m2  SpO2 96%  Physical Exam: General appearance: alert, cooperative and no distress Eyes: no scleral icterus Throat: oropharynx moist without erythema Resp: clear to auscultation bilaterally Cardio: regular rate and rhythm, S1, S2 normal and systolic murmur: early systolic 1/6, crescendo at lower left sternal border Extremities: no clubbing, cyanosis or edema Abdomen is soft nontender good bowel sounds. Neurologically cranial nerves appear intact perhaps mild subtle facial asymmetry clearly some very subtle mild dysarthria. No pronator drift no focal weakness involving upper lower extremities but she is diffusely weak. Gait is stay on stable and keep in mind this is baseline and she utilizes a walker at baseline largely because of her rheumatoid and osteoarthritis.  Discharge Labs:  [facility] 12/02/11 0655 12/01/11 1622  NA 141 140  K 4.4 3.4*  CL 104 102  CO2 25 27  GLUCOSE 79 154*  BUN 32* 38*  CREATININE 1.42* 1.77*  CALCIUM 9.2 9.4  MG -- --  PHOS -- --    Basename 12/02/11 0655 12/01/11 1622  AST 19 20  ALT 15 15  ALKPHOS 47 52  BILITOT 0.5 0.4  PROT 6.4 6.9  ALBUMIN 3.6 3.8    Basename 12/02/11 0655 12/01/11 1622  WBC 9.5 11.4*  NEUTROABS -- 7.7  HGB 11.4* 11.7*  HCT 34.4* 35.4*  MCV 88.7 88.1  PLT 168 174   No results found for this basename: CKTOTAL:3,CKMB:3,CKMBINDEX:3,TROPONINI:3 in the last 72 hours  Basename 12/02/11 0655  TSH 0.742  T4TOTAL --  T3FREE --  THYROIDAB --    Basename 12/02/11 0655  VITAMINB12 834  FOLATE --  FERRITIN --  TIBC --  IRON --  RETICCTPCT --    Discharge instructions: Discharge Orders    Future Orders Please Complete By Expires   Diet - low sodium heart healthy      Increase activity slowly          Disposition: Stable  Follow-up Appts: Skilled nursing physicians attached to that facility Condition on Discharge: Improved and stable condition  Tests Needing Follow-up: Nothing specific patient will require OT PT and speech therapy for at least one to 2 weeks for restoration of ADLs and functionality to get back to independent living. As  stated above she is arty under hypertensive and lipid management.  Signed: Chastin Garlitz A 12/03/2011, 6:50 AM

## 2011-12-03 NOTE — Clinical Social Work Note (Signed)
Pt is ready for discharge today to SNF. However, pt does not have a 3 night qualifying stay that Medicare requires in order to pay for SNF. CSW notified MD and discharge is still planned as pt is medically stable for discharge. RNCM is aware as well. CSW and RNCM met with pt and cousin to address discharge. CSW explained the issue with SNF placement in great detail. CSW also shared that lacking 3 night qualifying stay would not prohibit pt from entering a SNF, but Medicare would not cover the SNF stay and pt would have to pay out of pocket. Pt shared that she would not be able to afford the SNF stay. CSW explained other discharge options, such as home with home health services. RNCM also explained the process of appealing pt's discharge.   CSW and RNCM spoke with pt's daughter on the phone and explained the discharge situation. Pt's daughter was very upset as pt would be discharging home and would not have 24 hour care. CSW and pt's daughter spoke about possibility of discharging to an ALF. Pt's daughter shared that she would have liked to transition after SNF. CSW will provide a list of ALFs to pt and daughter for review. CSW also shared that a home health social worker could also assist in that transition.   RNCM spoke with pt's son, who will be calling the number provided to appeal discharge.   CSW will continue to follow to assist in facilitation of discharge.   Dede Query, MSW, Theresia Majors [phone]

## 2011-12-04 NOTE — Progress Notes (Signed)
I am visiting [name] today strictly as an outpatient. As the records will reflect she was discharged yesterday however daughter refused to comply with this discharge. Based upon medical necessity which included severity and intensity she met all criteria for discharge did not meet inpatient criteria for the additional third day as I tried to explain to the family. They seem very convinced and loosely between discussion the bedside today and a fairly aggressive called my nurse yesterday that this is all within my power at all I had to do was change date of discharge patient of Dr. third day. I tried to explain that it would be fraudulent for me to do so and is medically ready for discharge his based upon her medical needs not merely just a swipe of the pen it has to be intensity of service backing this up.  While I sympathize with challenges I did explain that should she not of the money and truly need post acute care she should apply for Medicaid. I also explained that with family support and home health she can probably make it back at home as she's not far from her baseline. The difficulty is that she was marginal at her baseline and that even a transition from here to an institutional noncervical long-term issues regardless, this is a fairly small stroke and she is at high risk for additional strokes we modify all risk factors and have her on the appropriate treatment. Her vital signs and exam are quite stable the only residual is mild dysarthria and its minimal at best. Her general ambulatory skills and functional skills not far from her baseline she was on a walker prior to admission. She remained stable from a hemodynamic and blood pressure standpoint with one excursion but no need for any changes. Vital signs and exam remain stable. Again today there no acute inpatient needs at all she has a need for his general care and therapy she is discharged.

## 2011-12-04 NOTE — Progress Notes (Signed)
Speech Language Pathology Treatment Patient Details Name: [name] MRN: [id number] DOB: 1930-02-02 Today's Date: 12/04/2011 Time: 4098-1191 SLP Time Calculation (min): 12 min  Assessment / Plan / Recommendation Clinical Impression   Treatment focused on speech intelligibility, selective attention, and problem solving, Patient able to [employer] speech intelligibility strategies at the conversation level with mod-max verbal and tactile clinician cues for increased mandibular depression, selectively attend to functional moderaly complex conversation with supervision, and carryout moderately complex problem solving task related to functional but non-familiar ADL with moderate SLP questioning cues. Patient making functional progress with goals and will continue to benefit from SLP f/u in the acute care setting.       SLP Plan  Continue with current plan of care    Pertinent Vitals/Pain n/a  SLP Goals  SLP Goals Potential to Achieve Goals: Good Progress/Goals/Alternative treatment plan discussed with pt/caregiver and they: Agree SLP Goal #1: Patient will utilize speech intelligiblity strategies at the conversation level with min verbal cues.   SLP Goal #1 - Progress: Progressing toward goal SLP Goal #2: Patient will demonstrate functional problem solving for moderately complex ADLs with supervision cues.  SLP Goal #2 - Progress: Progressing toward goal SLP Goal #3: Patient will demonstrate selective attention to moderately complex functional ADL for 15 minutes with supervision cues to redirect.  SLP Goal #3 - Progress: Progressing toward goal  General Behavior/Cognition: Alert;Cooperative;Pleasant mood Patient Positioning: Upright in bed      Treatment Treatment focused on: Cognition;Dysarthria;Patient/family/caregiver education Skilled Treatment: Treatment focused on speech intelligibility, selective attention, and problem solving, Patient able to [employer] speech intelligibility  strategies at the conversation level with mod-max verbal and tactile clinician cues for increased mandibular depression, selectively attend to functional moderaly complex conversation with supervision, and carryout moderately complex problem solving task related to functional but non-familiar ADL with moderate SLP questioning cues. Patient making functional progress with goals and will continue to benefit from SLP f/u in the acute care setting.     Ferdinand Lango MA, CCC-SLP [phone]  [name] [name] 12/04/2011, 11:47 AM

## 2011-12-04 NOTE — Progress Notes (Signed)
PT Note:  Noted pt d/c'ed per MD yesterday.  Unclear of pt's current admit status.  Pt still requires physical therapy to increase independence and safety with mobility.  Will hold therapy today and follow-up tomorrow for update in plan of care.  Will gladly continue to see pt to work toward physical therapy goals as able.  12/04/2011 Cephus Shelling, PT, DPT [phone]

## 2011-12-05 ENCOUNTER — Inpatient Hospital Stay (HOSPITAL_COMMUNITY): Payer: Medicare Other

## 2011-12-05 NOTE — Progress Notes (Signed)
Subjective: This morning the patient was seen and felt weaker than usual.  She is not having shortness of breath but have weakness in both lower extremities.  She has not been eating very much.  I reviewed the note from the occupational therapy specialist today who noted that her requirements for assistance with greatly increased from previously.  Objective: Vital signs in last 24 hours: Temp:  [98.6 F (37 C)-98.8 F (37.1 C)] 98.8 F (37.1 C) (06/07 0631) Pulse Rate:  [86-88] 88  (06/07 0631) Resp:  [18] 18  (06/07 0631) BP: (120-128)/(73-78) 120/78 mmHg (06/07 0631) SpO2:  [96 %-98 %] 96 % (06/07 0631) Weight change:    Intake/Output from previous day: 06/06 0701 - 06/07 0700 In: 243 [P.O.:240; I.V.:3] Out: -    General appearance: alert, cooperative and speech is somewhat slow Resp: clear to auscultation bilaterally Cardio: regular rate and rhythm, S1, S2 normal, no murmur, click, rub or gallop GI: soft, non-tender; bowel sounds normal; no masses,  no organomegaly normal bowel sounds  Lab Results: No results found for this basename: WBC:2,HGB:2,HCT:2,PLT:2 in the last 72 hours BMET No results found for this basename: NA:2,K:2,CL:2,CO2:2,GLUCOSE:2,BUN:2,CREATININE:2,CALCIUM:2 in the last 72 hours CMET CMP     Component Value Date/Time   NA 141 12/02/2011 0655   K 4.4 12/02/2011 0655   CL 104 12/02/2011 0655   CO2 25 12/02/2011 0655   GLUCOSE 79 12/02/2011 0655   BUN 32* 12/02/2011 0655   CREATININE 1.42* 12/02/2011 0655   CALCIUM 9.2 12/02/2011 0655   PROT 6.4 12/02/2011 0655   ALBUMIN 3.6 12/02/2011 0655   AST 19 12/02/2011 0655   ALT 15 12/02/2011 0655   ALKPHOS 47 12/02/2011 0655   BILITOT 0.5 12/02/2011 0655   GFRNONAA 34* 12/02/2011 0655   GFRAA 40* 12/02/2011 0655    CBG (last 3)  No results found for this basename: GLUCAP:3 in the last 72 hours  INR RESULTS:   Lab Results  Component Value Date   INR 1.03 12/01/2011   INR 1.00 05/11/2011   INR 0.92 07/06/2010    Neurologic exam:  She was alert with slow speech.  Cranial nerves II through XII were grossly normal, she had bilateral 4-5 strength in the hip flexor group and bilateral 5 out of 5 strength in the upper extremities.  Gait was not assessed at this time. Studies/Results: No results found.  Medications: I have reviewed the patient's current medications.  Assessment/Plan: #1 weakness: She has had an interval change in her exam from previously.  Although I did not retest her gait is appears that she would be at high risk for falls.  She may have had progression of versed small stroke to her larger infarction.  Accordingly, we will recheck a brain MRI scan and if possible, reinstate her inpatient status given her increased weakness and difficulty with ambulation.  She likely will require skilled nursing facility rehabilitation given this change in her status.  LOS: 4 days   Kit Mollett G 12/05/2011, 2:13 PM

## 2011-12-05 NOTE — Progress Notes (Signed)
Family states "they feel that the Pt is different from yesterday in that she is weak and just doesn't look good." On my assessment Pt c/o left leg pain with movement and stated she just doesn't feel good. Pt took assist x 2 to get OOB to the bedside commode. Once to the bedside commode Pt had 1 episode of vomiting, zofran was given. Will continue to monitor Pt. Rema Fendt, RN

## 2011-12-05 NOTE — Clinical Social Work Note (Signed)
CSW and RNCM met with pt's son Greggory Stallion. CSW and RNCM explained the reason why pt did not qualify for Medicare to pay for SNF placement and addressed other discharge options. CSW provided a list of ALFs in [place].   CSW contacted [employer] to inquire about bed avialability and possible amount pt will be charged. At admission without Medicare paying for SNF placement, pt will have to pay $6000. CSW informed pt' family of this and what the daily rate for SNF placement at [facility] would be. The earliest pt will be able to enter [facility] would be Monday. RNCM is aware. This CSW recommends that if pt were to discharge home with home health services, to add a social work to treatment team to address possible SNF placement and long term placement options, such as ALF. CSW will continue to follow during this admission.   Dede Query, MSW, Theresia Majors [phone]

## 2011-12-05 NOTE — Progress Notes (Signed)
Physical Therapy Treatment Patient Details Name: [name] MRN: [id number] DOB: [birth year] Today's Date: 12/05/2011 Time: 4098-1191 PT Time Calculation (min): 25 min  PT Assessment / Plan / Recommendation Comments on Treatment Session  Patient having increased difficulty with mobility and movement of LLE.  Patient requiring more assistance with sitting and unable to stand.  MRI pending per chart.    Follow Up Recommendations  Skilled nursing facility    Barriers to Discharge        Equipment Recommendations  Defer to next venue    Recommendations for Other Services    Frequency Min 4X/week   Plan Discharge plan remains appropriate;Frequency remains appropriate    Precautions / Restrictions Precautions Precautions: Fall Restrictions Weight Bearing Restrictions: No       Mobility  Bed Mobility Bed Mobility: Rolling Right;Right Sidelying to Sit;Sitting - Scoot to [place] of Bed;Sit to Supine Rolling Right: 3: Mod assist;With rail Right Sidelying to Sit: 2: Max assist;HOB flat Sitting - Scoot to [place] of Bed: 2: Max assist Sit to Supine: 1: +2 Total assist;HOB flat Sit to Supine: Patient Percentage: 10% Details for Bed Mobility Assistance: Patient having difficulty following commands to perform tasks.  Needed max assist to move LLE. Transfers Transfers: Sit to Stand Sit to Stand: 2: Max assist;From bed Sit to Stand: Patient Percentage: 20% Details for Transfer Assistance: Patient unable to reach a standing position (unable to clear hips from bed) with max assist.  Required assist maintaining static sitting balance prior to standing attempts x3. Ambulation/Gait Ambulation/Gait Assistance: Not tested (comment) Ambulation/Gait Assistance Details: Patient unable to stand or ambulate today.     PT Goals Acute Rehab PT Goals PT Goal: Supine/Side to Sit - Progress: Not progressing PT Goal: Sit to Supine/Side - Progress: Not progressing PT Goal: Sit to Stand - Progress: Not  progressing PT Goal: Ambulate - Progress: Not progressing  Visit Information  Last PT Received On: 12/05/11 Assistance Needed: +2    Subjective Data  Subjective: Minimal conversation.   Cognition  Overall Cognitive Status: Difficult to assess Area of Impairment: Attention;Problem solving;Memory Arousal/Alertness: Awake/alert Orientation Level: Appears intact for tasks assessed Behavior During Session: Flat affect Current Attention Level: Sustained Attention - Other Comments: Difficulty focusing on task with family talking in room Problem Solving: max cues and assist for basic problem solving Cognition - Other Comments: Difficulty following commands "Hold the rail with your left hand"    Balance  Balance Balance Assessed: Yes Static Sitting Balance Static Sitting - Balance Support: Right upper extremity supported;Feet supported Static Sitting - Level of Assistance: 3: Mod assist Static Sitting - Comment/# of Minutes: Patient sat on EOB x 6 minutes working on midline upright sitting posture.  Patient leaning posteriorly and to right.   End of Session PT - End of Session Equipment Utilized During Treatment: Gait belt Activity Tolerance: Patient limited by fatigue Patient left: in bed;with call bell/phone within reach;with family/visitor present Nurse Communication: Mobility status    Vena Austria 12/05/2011, 6:30 PM Durenda Hurt. Renaldo Fiddler, [facility] Acute Rehab Services Pager [phone]

## 2011-12-05 NOTE — Progress Notes (Signed)
Occupational Therapy Treatment Patient Details Name: [name] MRN: [id number] DOB: [birth year] Today's Date: 12/05/2011 Time: 5621-3086 OT Time Calculation (min): 34 min  OT Assessment / Plan / Recommendation Comments on Treatment Session Pt. is requiring signifcantly more assistance today.  Problem solving is poor, and pt. requiring max cues and assist for basic bed mobility.  Pt. appears to putting forth little to no effort, but difficult to tell if this is volitional or due to a true change in cognition.  Pt. very conversant during session, and able to recall details of conversation at end of session, but unable to initiate or motor plan through basic movement.  Inconsitencies in performance noted    Follow Up Recommendations  Supervision/Assistance - 24 hour;Skilled nursing facility    Barriers to Discharge       Equipment Recommendations  Defer to next venue    Recommendations for Other Services    Frequency Min 2X/week   Plan Discharge plan remains appropriate    Precautions / Restrictions Precautions Precautions: Fall Restrictions Weight Bearing Restrictions: No   Pertinent Vitals/Pain     ADL  Lower Body Dressing: Performed;+1 Total assistance Where Assessed - Lower Body Dressing: Unsupported sitting ADL Comments: Pt. initially refusing OT due to nausea and complaining that Lt. LE not moving well today.  Spoke with RN and pt. convinced to attempt OT.  Pt agreed.  Pt. did not initiate movement with Lt. LE, but when cues and min A provided, did assist with sliding it over bed.  Pt. demonstrated maximal difficulty reaching for rail with Lt. UE and initiating rolling to Rt. - min A provided.  Pt. moved Rt. sidelying to sit, but  leaned Rt. into rail requiring max assis to sit fully erect.  At that time, pt. adjusted gown and reached for items needed with Lt. UE and no difficulty.  Pt. noted to lean Rt. and poseteriorly when seated.  Would not attempt donning socks saying she  couldn't.  Then pt. insisting she could do no more, and requested assist to return to supine.  Assiste pt LEs onto bed.  Pt. lying on Rt. side hugging the rail.  When cued to roll on her back, she stated she couldn't and needed help.  Pt. would not let go of rails and did not initiate rolling back - she kept asking for therapist to help her head get to the pillow.   Finally, after max instruction and max cues, pt. released rail and rolled onto her back.     OT Diagnosis:    OT Problem List:   OT Treatment Interventions:     OT Goals ADL Goals ADL Goal: Grooming - Progress: Not progressing ADL Goal: Upper Body Bathing - Progress: Not progressing ADL Goal: Lower Body Bathing - Progress: Not progressing ADL Goal: Toilet Transfer - Progress: Not progressing ADL Goal: Toileting - Clothing Manipulation - Progress: Not progressing ADL Goal: Toileting - Hygiene - Progress: Not progressing  Visit Information  Last OT Received On: 12/05/11 Assistance Needed: +1    Subjective Data      Prior Functioning       Cognition  Overall Cognitive Status: Difficult to assess (pt. slow to process information) Area of Impairment: Attention;Problem solving;Memory Arousal/Alertness: Awake/alert Orientation Level: Oriented X4 / Intact Behavior During Session: [facility] for tasks performed Current Attention Level: Selective Problem Solving: max cues and assist for basic problem solving    Mobility Bed Mobility Bed Mobility: Rolling Right;Right Sidelying to Sit;Sitting - Scoot to [employer]  of Bed;Sit to Sidelying Right Rolling Right: 3: Mod assist Right Sidelying to Sit: 2: Max assist Sitting - Scoot to Edge of Bed: 2: Max assist Sit to Sidelying Right: 2: Max assist Details for Bed Mobility Assistance: see comments under ADL section   Exercises    Balance    End of Session OT - End of Session Activity Tolerance: Patient limited by pain;Patient limited by fatigue Patient left: in chair;with call bell/phone  within reach Nurse Communication: Other (comment) (change in status and pt. performance)   Taylan Marez, Ursula Alert M 12/05/2011, 1:25 PM

## 2011-12-06 ENCOUNTER — Inpatient Hospital Stay (HOSPITAL_COMMUNITY): Payer: Medicare Other

## 2011-12-06 NOTE — Progress Notes (Signed)
Subjective: Continues to have moderate LLE weakness and had emesis x 1 today. She is not having dyspnea or chest pain.   Objective: Vital signs in last 24 hours: Temp:  [98.5 F (36.9 C)-100.5 F (38.1 C)] 99.9 F (37.7 C) (06/08 0618) Pulse Rate:  [82-117] 105  (06/08 1026) Resp:  [18-20] 18  (06/08 1026) BP: (112-125)/(64-75) 121/74 mmHg (06/08 1026) SpO2:  [90 %-98 %] 90 % (06/08 1026) Weight change:    Intake/Output from previous day:     General appearance: alert, cooperative and no distress Resp: clear to auscultation bilaterally Cardio: regular rate and rhythm, S1, S2 normal, no murmur, click, rub or gallop GI: soft, non-tender; bowel sounds normal; no masses,  no organomegaly  Lab Results: No results found for this basename: WBC:2,HGB:2,HCT:2,PLT:2 in the last 72 hours BMET No results found for this basename: NA:2,K:2,CL:2,CO2:2,GLUCOSE:2,BUN:2,CREATININE:2,CALCIUM:2 in the last 72 hours CMET CMP     Component Value Date/Time   NA 141 12/02/2011 0655   K 4.4 12/02/2011 0655   CL 104 12/02/2011 0655   CO2 25 12/02/2011 0655   GLUCOSE 79 12/02/2011 0655   BUN 32* 12/02/2011 0655   CREATININE 1.42* 12/02/2011 0655   CALCIUM 9.2 12/02/2011 0655   PROT 6.4 12/02/2011 0655   ALBUMIN 3.6 12/02/2011 0655   AST 19 12/02/2011 0655   ALT 15 12/02/2011 0655   ALKPHOS 47 12/02/2011 0655   BILITOT 0.5 12/02/2011 0655   GFRNONAA 34* 12/02/2011 0655   GFRAA 40* 12/02/2011 0655    CBG (last 3)  No results found for this basename: GLUCAP:3 in the last 72 hours  INR RESULTS:   Lab Results  Component Value Date   INR 1.03 12/01/2011   INR 1.00 05/11/2011   INR 0.92 07/06/2010     Studies/Results: Mr Brain Wo Contrast  12/05/2011  *RADIOLOGY REPORT*  Clinical Data: Recent onset of weakness in both lower extremities. Recent small infarct.  Question progression of disease.  MRI HEAD WITHOUT CONTRAST  Technique:  Multiplanar, multiecho pulse sequences of the brain and surrounding structures were obtained  according to standard protocol without intravenous contrast.  Comparison: MRI brain 12/02/2011.  Findings: The area of restricted diffusion involving the left corpus callosum and adjacent cingulate gyrus is not significantly changed.  A punctate area of restricted diffusion in the posterior right sphenoid gyrus is also stable.  There is a single punctate area of restricted diffusion which is new in the more posterior medial left frontal lobe.  Confluent periventricular white matter changes are otherwise stable bilaterally.  Remote subcortical white matter encephalomalacia of the posterior temporal lobe is stable.  Lacunar infarcts of the basal ganglia are stable bilaterally.  A remote infarct of the anterior right frontal lobe is unchanged.  Flow is present in the major intracranial arteries.  The patient is status post bilateral lens extractions.  The paranasal sinuses and mastoid air cells are clear.  IMPRESSION:  1.  Punctate new area of restricted diffusion in the more posterior left medial frontal lobe.  This could account for the increase and right leg weakness. 2.  Otherwise stable infarcts of the corpus callosum neck and left cingulate gyrus with a punctate area in the right cingulate gyrus. 3.  Atrophy and extensive white matter disease is otherwise stable. 4.  Remote to infarcts of the anterior right frontal lobe and posterior right temporal lobe are stable.  Original Report Authenticated By: Jamesetta Orleans. MATTERN, M.D.    Medications: I have reviewed the patient's current  medications.  Assessment/Plan: #1 Weakness:  Due primarily to new strokes. She has significant gait instability with skilled needs (PT and OT rehab), so we will plan on eventual SNF transfer and rehab likely early next week.  #2 Fever and Nausea:  Unclear cause and possibly from UTI. We will check abdomen X-rays and urine culture and U/A. She will be given IV antiemetics as needed.   LOS: 5 days   [name] 12/06/2011,  12:01 PM

## 2011-12-06 NOTE — Progress Notes (Signed)
Moderate amount of emesis. Pale, eyes closed. Will open with command. Daughter fears pt has given up. Pt expresses discouragement about strokes.

## 2011-12-07 ENCOUNTER — Inpatient Hospital Stay (HOSPITAL_COMMUNITY): Payer: Medicare Other

## 2011-12-07 DIAGNOSIS — Z8673 Personal history of transient ischemic attack (TIA), and cerebral infarction without residual deficits: Secondary | ICD-10-CM | POA: Diagnosis not present

## 2011-12-07 DIAGNOSIS — R2681 Unsteadiness on feet: Secondary | ICD-10-CM | POA: Diagnosis present

## 2011-12-07 DIAGNOSIS — R1310 Dysphagia, unspecified: Secondary | ICD-10-CM | POA: Diagnosis not present

## 2011-12-07 DIAGNOSIS — E440 Moderate protein-calorie malnutrition: Secondary | ICD-10-CM | POA: Diagnosis present

## 2011-12-07 LAB — CBC
MCH: 29.5 pg (ref 26.0–34.0)
MCV: 90 fL (ref 78.0–100.0)
Platelets: 154 10*3/uL (ref 150–400)
RDW: 14.8 % (ref 11.5–15.5)

## 2011-12-07 LAB — URINALYSIS, ROUTINE W REFLEX MICROSCOPIC
Glucose, UA: NEGATIVE mg/dL
Leukocytes, UA: NEGATIVE
pH: 5.5 (ref 5.0–8.0)

## 2011-12-07 LAB — COMPREHENSIVE METABOLIC PANEL
AST: 18 U/L (ref 0–37)
Albumin: 3 g/dL — ABNORMAL LOW (ref 3.5–5.2)
CO2: 22 mEq/L (ref 19–32)
Calcium: 9.9 mg/dL (ref 8.4–10.5)
Creatinine, Ser: 1.76 mg/dL — ABNORMAL HIGH (ref 0.50–1.10)
GFR calc non Af Amer: 26 mL/min — ABNORMAL LOW (ref >90)

## 2011-12-07 MED ORDER — ENOXAPARIN SODIUM 30 MG/0.3ML ~~LOC~~ SOLN
30.0000 mg | Freq: Every day | SUBCUTANEOUS | Status: DC
  Administered 2011-12-07 – 2011-12-08 (×2): 30 mg via SUBCUTANEOUS
  Filled 2011-12-07 (×3): qty 0.3

## 2011-12-07 MED ORDER — DEXTROSE 5 % IV SOLN
1.0000 g | INTRAVENOUS | Status: DC
  Administered 2011-12-07 – 2011-12-08 (×2): 1 g via INTRAVENOUS
  Filled 2011-12-07 (×3): qty 10

## 2011-12-07 MED ORDER — METHYLPREDNISOLONE SODIUM SUCC 125 MG IJ SOLR
60.0000 mg | Freq: Four times a day (QID) | INTRAMUSCULAR | Status: DC
  Administered 2011-12-07 – 2011-12-08 (×5): 60 mg via INTRAVENOUS
  Filled 2011-12-07 (×7): qty 0.96

## 2011-12-07 NOTE — Progress Notes (Signed)
Patient was I&O catheterized in order to obtain specimen for UA.  Procedure performed using sterile technique and 10 ml of clear, yellow urine obtained and tubed to lab. Patient tolerated procedure well. Will continue to monitor.

## 2011-12-07 NOTE — Progress Notes (Signed)
UA results negative

## 2011-12-07 NOTE — Progress Notes (Signed)
Subjective: Lethargic today, not eating much at all, had fever earlier today. No further vomiting or nausea or abdominal pain.   Objective: Vital signs in last 24 hours: Temp:  [97.6 F (36.4 C)-100.5 F (38.1 C)] 97.7 F (36.5 C) (06/09 0945) Pulse Rate:  [103-119] 119  (06/09 0945) Resp:  [20-22] 20  (06/09 0945) BP: (111-135)/(72-80) 125/80 mmHg (06/09 0945) SpO2:  [93 %-99 %] 95 % (06/09 0945) Weight change:    Intake/Output from previous day: 06/08 0701 - 06/09 0700 In: -  Out: 10 [Urine:10]   General appearance: sleepy, briefly opens eyes with exam, speech is soft Resp: clear to auscultation bilaterally Cardio: regular rate and rhythm, S1, S2 normal, no murmur, click, rub or gallop GI: soft, non-tender; bowel sounds normal; no masses,  no organomegaly Neurologic: Mental status: sleepy, briefly opens eyes with exam Motor: would not or could not move both lower extremities  Lab Results:  [facility] 12/07/11 0640  WBC 16.4*  HGB 12.7  HCT 38.8  PLT 154   BMET  Basename 12/07/11 0640  NA 135  K 4.3  CL 97  CO2 22  GLUCOSE 133*  BUN 31*  CREATININE 1.76*  CALCIUM 9.9   CMET CMP     Component Value Date/Time   NA 135 12/07/2011 0640   K 4.3 12/07/2011 0640   CL 97 12/07/2011 0640   CO2 22 12/07/2011 0640   GLUCOSE 133* 12/07/2011 0640   BUN 31* 12/07/2011 0640   CREATININE 1.76* 12/07/2011 0640   CALCIUM 9.9 12/07/2011 0640   PROT 7.2 12/07/2011 0640   ALBUMIN 3.0* 12/07/2011 0640   AST 18 12/07/2011 0640   ALT 14 12/07/2011 0640   ALKPHOS 63 12/07/2011 0640   BILITOT 0.7 12/07/2011 0640   GFRNONAA 26* 12/07/2011 0640   GFRAA 31* 12/07/2011 0640    CBG (last 3)  No results found for this basename: GLUCAP:3 in the last 72 hours  INR RESULTS:   Lab Results  Component Value Date   INR 1.03 12/01/2011   INR 1.00 05/11/2011   INR 0.92 07/06/2010     Studies/Results: Mr Brain Wo Contrast  12/05/2011  *RADIOLOGY REPORT*  Clinical Data: Recent onset of weakness in both lower  extremities. Recent small infarct.  Question progression of disease.  MRI HEAD WITHOUT CONTRAST  Technique:  Multiplanar, multiecho pulse sequences of the brain and surrounding structures were obtained according to standard protocol without intravenous contrast.  Comparison: MRI brain 12/02/2011.  Findings: The area of restricted diffusion involving the left corpus callosum and adjacent cingulate gyrus is not significantly changed.  A punctate area of restricted diffusion in the posterior right sphenoid gyrus is also stable.  There is a single punctate area of restricted diffusion which is new in the more posterior medial left frontal lobe.  Confluent periventricular white matter changes are otherwise stable bilaterally.  Remote subcortical white matter encephalomalacia of the posterior temporal lobe is stable.  Lacunar infarcts of the basal ganglia are stable bilaterally.  A remote infarct of the anterior right frontal lobe is unchanged.  Flow is present in the major intracranial arteries.  The patient is status post bilateral lens extractions.  The paranasal sinuses and mastoid air cells are clear.  IMPRESSION:  1.  Punctate new area of restricted diffusion in the more posterior left medial frontal lobe.  This could account for the increase and right leg weakness. 2.  Otherwise stable infarcts of the corpus callosum neck and left cingulate gyrus with a  punctate area in the right cingulate gyrus. 3.  Atrophy and extensive white matter disease is otherwise stable. 4.  Remote to infarcts of the anterior right frontal lobe and posterior right temporal lobe are stable.  Original Report Authenticated By: Jamesetta Orleans. MATTERN, M.D.   Dg Abd 2 Views  12/06/2011  *RADIOLOGY REPORT*  Clinical Data: Nausea vomiting.  Abdominal pain loose stools.  ABDOMEN - 2 VIEW  Comparison: Abdominal radiograph 03/19/2006CT abdomen pelvis 06/09/2004.  Findings: The bowel gas pattern is nonobstructive.  No significant stool burden.   Chronic sclerosis of the right iliac bone adjacent to the sacroiliac joint.  This is stable compared to prior abdominal pelvic CT of 2009.  Focal defect in the right iliac bone is also a chronic and may be postsurgical.  Remote fracture deformity of the right pubic bone.  Spinal fusion rods in the thoracic spine.  No evidence of free intraperitoneal air.  Atherosclerotic calcification of the abdominal aorta.  IMPRESSION: Nonobstructive bowel gas pattern.  Chronic changes of the bony pelvis, stable.  Original Report Authenticated By: Britta Mccreedy, M.D.    Medications: I have reviewed the patient's current medications.  Assessment/Plan: #1 Fever:  Unclear source and likely due to aspiration pneumonia given normal urinalysis and recent vomiting. #2 Fatigue: due to fever and adrenal insufficiency. Will add antibiotic and IV corticosteroids. #3 Dysphagia:  Due to strokes and extreme fatigue. Will have speech pathologist evaluate her.  LOS: 6 days   Fawne Hughley G 12/07/2011, 1:35 PM

## 2011-12-07 NOTE — Progress Notes (Signed)
SLP Cancellation Note ST received order for Cognitive Evaluation this date. Patient is currently on ST caseload for treatment in area of cognition.  Moreen Fowler MS CCC-SLP [phone]  [facility] 12/07/2011, 1:54 PM

## 2011-12-08 ENCOUNTER — Inpatient Hospital Stay (HOSPITAL_COMMUNITY): Payer: Medicare Other

## 2011-12-08 DIAGNOSIS — J189 Pneumonia, unspecified organism: Secondary | ICD-10-CM | POA: Diagnosis not present

## 2011-12-08 DIAGNOSIS — E2740 Unspecified adrenocortical insufficiency: Secondary | ICD-10-CM | POA: Diagnosis present

## 2011-12-08 LAB — COMPREHENSIVE METABOLIC PANEL
ALT: 12 U/L (ref 0–35)
Albumin: 2.5 g/dL — ABNORMAL LOW (ref 3.5–5.2)
Alkaline Phosphatase: 62 U/L (ref 39–117)
BUN: 42 mg/dL — ABNORMAL HIGH (ref 6–23)
Chloride: 102 mEq/L (ref 96–112)
Potassium: 4.8 mEq/L (ref 3.5–5.1)
Total Bilirubin: 0.3 mg/dL (ref 0.3–1.2)

## 2011-12-08 LAB — CBC
MCHC: 32.6 g/dL (ref 30.0–36.0)
Platelets: 147 10*3/uL — ABNORMAL LOW (ref 150–400)
RDW: 14.9 % (ref 11.5–15.5)
WBC: 15.2 10*3/uL — ABNORMAL HIGH (ref 4.0–10.5)

## 2011-12-08 LAB — URINE CULTURE
Colony Count: NO GROWTH
Culture  Setup Time: 201306091121

## 2011-12-08 MED ORDER — PREDNISONE 20 MG PO TABS
20.0000 mg | ORAL_TABLET | Freq: Every day | ORAL | Status: DC
  Administered 2011-12-09: 20 mg via ORAL
  Filled 2011-12-08 (×2): qty 1

## 2011-12-08 NOTE — Progress Notes (Signed)
Physical Therapy Treatment Patient Details Name: [name] MRN: [id number] DOB: 1929-09-03 Today's Date: 12/08/2011 Time: 4098-1191 PT Time Calculation (min): 32 min  PT Assessment / Plan / Recommendation Comments on Treatment Session  Pt s/p L CVA and has improved since last session. Tolerated stand pivot transfer. Pt would continue to benefit from PT to facilitate a safe D/C to SNF.     Follow Up Recommendations  Skilled nursing facility    Barriers to Discharge        Equipment Recommendations  Defer to next venue    Recommendations for Other Services    Frequency Min 4X/week   Plan Discharge plan remains appropriate;Frequency remains appropriate    Precautions / Restrictions Precautions Precautions: Fall Restrictions Weight Bearing Restrictions: No   Pertinent Vitals/Pain N/A    Mobility  Bed Mobility Bed Mobility: Supine to Sit;Sitting - Scoot to Edge of Bed Supine to Sit: 1: +2 Total assist Supine to Sit: Patient Percentage: 10% Sitting - Scoot to Edge of Bed: 1: +2 Total assist Sitting - Scoot to Edge of Bed: Patient Percentage: 10% Details for Bed Mobility Assistance: Assist to translate trunk anterior as well as bring LE's to EOB. Cues for sequence and safe hand placement.  Transfers Transfers: Sit to Stand;Stand to Sit;Stand Pivot Transfers Sit to Stand: 1: +2 Total assist;From bed;With upper extremity assist Sit to Stand: Patient Percentage: 10% Stand to Sit: 1: +2 Total assist;To chair/3-in-1;With armrests;With upper extremity assist Stand to Sit: Patient Percentage: 10% Stand Pivot Transfers: 1: +2 Total assist Stand Pivot Transfers: Patient Percentage: 10% Details for Transfer Assistance: Pt barely able to use legs to come to stand. + 2 for safety . Assist to bring body weight over BOS. Cues for tall posture, sequence, and safe LE placement.  Ambulation/Gait Ambulation/Gait Assistance: Not tested (comment) Stairs: No Wheelchair Mobility Wheelchair  Mobility: No Modified Rankin (Stroke Patients Only) Pre-Morbid Rankin Score: No symptoms Modified Rankin: Moderately severe disability    Exercises     PT Diagnosis:    PT Problem List:   PT Treatment Interventions:     PT Goals Acute Rehab PT Goals PT Goal Formulation: With patient Time For Goal Achievement: 12/16/11 Potential to Achieve Goals: Good PT Goal: Supine/Side to Sit - Progress: Progressing toward goal PT Goal: Sit to Stand - Progress: Progressing toward goal PT Goal: Stand to Sit - Progress: Progressing toward goal  Visit Information  Last PT Received On: 12/08/11 Assistance Needed: +2    Subjective Data  Subjective: "I will try." Patient Stated Goal: Go home eventually.    Cognition  Overall Cognitive Status: Difficult to assess Area of Impairment: Problem solving;Following commands Arousal/Alertness: Awake/alert Orientation Level: Appears intact for tasks assessed Behavior During Session: [facility] for tasks performed Following Commands: Follows one step commands inconsistently (Increased verbal and tactile cues to follow commands correct) Problem Solving: Cues and assist for problem solving    Balance  Balance Balance Assessed: Yes Static Sitting Balance Static Sitting - Balance Support: Bilateral upper extremity supported;Feet supported Static Sitting - Level of Assistance: 3: Mod assist Static Sitting - Comment/# of Minutes: Pt able to tolerate sitting balance about 5 minutes at EOB. Cues for upright posture and coming to midline.   End of Session PT - End of Session Equipment Utilized During Treatment: Gait belt Activity Tolerance: Patient limited by fatigue Patient left: in chair;with call bell/phone within reach;with family/visitor present Nurse Communication: Mobility status    [name] 12/08/2011, 11:00 AM

## 2011-12-08 NOTE — Progress Notes (Signed)
Subjective: Strength is improving as well as ability to pivot with PT. She passed her bedside swallow test today. She had mild difficulty voiding today, but did not need a catheter.  Objective: Vital signs in last 24 hours: Temp:  [98.1 F (36.7 C)-99.4 F (37.4 C)] 98.6 F (37 C) (06/10 1800) Pulse Rate:  [95-102] 98  (06/10 1800) Resp:  [17-20] 17  (06/10 1800) BP: (109-148)/(70-84) 133/76 mmHg (06/10 1800) SpO2:  [93 %-96 %] 96 % (06/10 1800) Weight change:    Intake/Output from previous day: 06/09 0701 - 06/10 0700 In: 50 [P.O.:50] Out: -    General appearance: alert, cooperative and no distress Resp: clear to auscultation bilaterally Cardio: regular rate and rhythm, S1, S2 normal, no murmur, click, rub or gallop GI: soft, non-tender; bowel sounds normal; no masses,  no organomegaly Extremities: extremities normal, atraumatic, no cyanosis or edema  Lab Results:  Basename 12/08/11 0510 12/07/11 0640  WBC 15.2* 16.4*  HGB 10.9* 12.7  HCT 33.4* 38.8  PLT 147* 154   BMET  Basename 12/08/11 0510 12/07/11 0640  NA 137 135  K 4.8 4.3  CL 102 97  CO2 23 22  GLUCOSE 143* 133*  BUN 42* 31*  CREATININE 1.68* 1.76*  CALCIUM 9.2 9.9   CMET CMP     Component Value Date/Time   NA 137 12/08/2011 0510   K 4.8 12/08/2011 0510   CL 102 12/08/2011 0510   CO2 23 12/08/2011 0510   GLUCOSE 143* 12/08/2011 0510   BUN 42* 12/08/2011 0510   CREATININE 1.68* 12/08/2011 0510   CALCIUM 9.2 12/08/2011 0510   PROT 6.5 12/08/2011 0510   ALBUMIN 2.5* 12/08/2011 0510   AST 14 12/08/2011 0510   ALT 12 12/08/2011 0510   ALKPHOS 62 12/08/2011 0510   BILITOT 0.3 12/08/2011 0510   GFRNONAA 28* 12/08/2011 0510   GFRAA 32* 12/08/2011 0510    CBG (last 3)  No results found for this basename: GLUCAP:3 in the last 72 hours  INR RESULTS:   Lab Results  Component Value Date   INR 1.03 12/01/2011   INR 1.00 05/11/2011   INR 0.92 07/06/2010     Studies/Results: Dg Chest Port 1 View  12/07/2011   *RADIOLOGY REPORT*  Clinical Data: Fever, recent vomiting.  Question aspiration.  PORTABLE CHEST - 1 VIEW  Comparison: Chest radiograph 12/01/2011 and chest CT 05/12/2011  Findings: Thoracic spine fixation rods are present.  Heart size is within normal limits and stable.  Probable hiatal hernia contour, but not as well visualized on today's study as on the recent two- view chest radiograph.  Lung volumes are low bilaterally.  There is a small lateral left basilar opacity.  Right lung appears clear. Upper abdomen shows a nonobstructive bowel gas pattern.  IMPRESSION:  1. Small opacity lateral left lung base could reflect atelectasis or airspace disease.  2. Probable persistent hiatal hernia.  Original Report Authenticated By: Britta Mccreedy, M.D.    Medications: I have reviewed the patient's current medications.  Assessment/Plan: #1 s/p Stroke: much improved, and we will plan on transfer to a SNF tomorrow. #2 Fever: resolved and cause is unclear. Possibly due to LLL pneumonia. Stable on rocephin. #3 Adrenal insufficiency:  Improved with IV corticosteroids. Will change to prednisone.  LOS: 7 days   Gwynn Crossley G 12/08/2011, 6:52 PM

## 2011-12-08 NOTE — Progress Notes (Signed)
[name] had some difficulty for voiding this morning. Bladder scan her again around 10:30 am, and found 500 ml of urine. She couldn't void in bed side commode, then took her back in bed and she voided in the bed pan 550 ml. So didn't put the foley in. Will keep close monitoring and bladder scan her if she doesn't void again.

## 2011-12-08 NOTE — Progress Notes (Signed)
Agree with treatment session.  12/08/2011 Amely Voorheis M Laree Garron, PT, DPT [phone]   

## 2011-12-08 NOTE — Evaluation (Signed)
Clinical/Bedside Swallow Evaluation Patient Details  Name: [name] MRN: [id number] Date of Birth: [birth year]  Today's Date: 12/08/2011 Time: 4098-1191 SLP Time Calculation (min): 21 min  Past Medical History:  Past Medical History  Diagnosis Date  . SOB (shortness of breath)   . MI (myocardial infarction)   . Poor appetite   . Arthritis   . Fatigue   . Right ear pain   . Renal insufficiency   . RA (rheumatoid arthritis)   . PUD (peptic ulcer disease)   . GI bleed 2005  . GERD (gastroesophageal reflux disease)   . Hiatal hernia   . Hypothyroidism   . Osteoporosis   . Depression   . Anxiety   . Coronary artery disease   . CVA (cerebral vascular accident)   . TIA (transient ischemic attack)    Past Surgical History:  Past Surgical History  Procedure Date  . Cardiac catheterization     SHOWED RUPTURE PLAQUE IN THE LAD. THE LAD IS NONOBSTRUCTIVE WITH ONLY 30-40% STENOSIS  . Nstemi 06/2010  . Spinal fusion surgery   . Knee surgery    HPI:  Patient is a 78 year old female who has had slurred speech for the past couple of days.  Her family considered tohe possibility that this was TIA as she has had in the past, but waited to see if it would improve.  It has not to the best of their knowledge, so she was brought to the [facility] ER, where assessment was nonacute by CT, she is not code stroke due to timing, and infection workup negative. MRI 6/4 indicated acute left corpus collosum infarct, and remote right frontal, right temproral, infereior left occipital, and small bilateral thalamic infarcts. Patient developed increasing lethargy, nausea, vomitting, abdominal pain on 6/7. New diagnosis of potential aspiration PNA noted. Swallow evaluation ordered.      Assessment / Plan / Recommendation Clinical Impression  Patient presents with what appears to be a safe and functional oropharyngeal swallow without overt indication of aspiration observed. Suspect that if patient does have  aspiration PNA, then recent vomitting episodes may have contributed as patient does not appear to have dysphagia at this time. Will defer MBS unless MD feels necessary. No further SLP needs for swallowing noted. Will continue to f/u for cognitive-linguistic skills development.     Aspiration Risk  Mild    Diet Recommendation Regular;Thin liquid   Liquid Administration via: Cup;Straw Medication Administration: Whole meds with liquid Supervision: Patient able to self feed;Intermittent supervision to cue for compensatory strategies Compensations: Slow rate;Small sips/bites Postural Changes and/or Swallow Maneuvers: Seated upright 90 degrees    Other  Recommendations Oral Care Recommendations: Oral care BID   Follow Up Recommendations  Skilled Nursing facility       Pertinent Vitals/Pain n/a    SLP Swallow Goals     Swallow Study Prior Functional Status       General HPI: Patient is a 78 year old female who has had slurred speech for the past couple of days.  Her family considered tohe possibility that this was TIA as she has had in the past, but waited to see if it would improve.  It has not to the best of their knowledge, so she was brought to the [facility] ER, where assessment was nonacute by CT, she is not code stroke due to timing, and infection workup negative. MRI 6/4 indicated acute left corpus collosum infarct, and remote right frontal, right temproral, infereior left occipital, and small  bilateral thalamic infarcts. Patient developed increasing lethargy, nausea, vomitting, abdominal pain on 6/7. New diagnosis of potential aspiration PNA noted. Swallow evaluation ordered.    Type of Study: Bedside swallow evaluation Previous Swallow Assessment: n/a; patient passed RN stroke swallow screen on admission Diet Prior to this Study: Regular;Thin liquids Temperature Spikes Noted: Yes Respiratory Status: Room air History of Recent Intubation: No Behavior/Cognition:  Alert;Cooperative;Pleasant mood Oral Cavity - Dentition: Adequate natural dentition Self-Feeding Abilities: Able to feed self Patient Positioning: Upright in chair Baseline Vocal Quality: Clear Volitional Cough: Strong Volitional Swallow: Able to elicit    Oral/Motor/Sensory Function Overall Oral Motor/Sensory Function: Impaired (see initial cognitive-linguistic evaluation)   Ice Chips Ice chips: Not tested   Thin Liquid Thin Liquid: Within functional limits Presentation: Straw;Self Fed    Nectar Thick Nectar Thick Liquid: Not tested   Honey Thick Honey Thick Liquid: Not tested   Puree Puree: Within functional limits Presentation: Self Fed;Spoon   Solid Solid: Within functional limits Presentation: Self Fed   Samantha Damewood MA, CCC-SLP [phone]  [name] 12/08/2011,9:11 AM

## 2011-12-08 NOTE — Progress Notes (Signed)
Patient received very lethargic, but alert, responsive and follows commands;  Tech reports low urine output this AM; MD made aware, order received for foley catheter insertion; was unsuccessful with cath. placement due to felt resistance; bladder scanned -revealed .  Endorsed to oncoming nurse to attempt placement later.

## 2011-12-09 DIAGNOSIS — M25559 Pain in unspecified hip: Secondary | ICD-10-CM | POA: Diagnosis not present

## 2011-12-09 DIAGNOSIS — E46 Unspecified protein-calorie malnutrition: Secondary | ICD-10-CM | POA: Diagnosis not present

## 2011-12-09 DIAGNOSIS — G459 Transient cerebral ischemic attack, unspecified: Secondary | ICD-10-CM | POA: Diagnosis not present

## 2011-12-09 DIAGNOSIS — R0602 Shortness of breath: Secondary | ICD-10-CM | POA: Diagnosis not present

## 2011-12-09 DIAGNOSIS — E785 Hyperlipidemia, unspecified: Secondary | ICD-10-CM | POA: Diagnosis not present

## 2011-12-09 DIAGNOSIS — E039 Hypothyroidism, unspecified: Secondary | ICD-10-CM | POA: Diagnosis not present

## 2011-12-09 DIAGNOSIS — Z7901 Long term (current) use of anticoagulants: Secondary | ICD-10-CM | POA: Diagnosis not present

## 2011-12-09 DIAGNOSIS — I749 Embolism and thrombosis of unspecified artery: Secondary | ICD-10-CM | POA: Diagnosis not present

## 2011-12-09 DIAGNOSIS — M129 Arthropathy, unspecified: Secondary | ICD-10-CM | POA: Diagnosis not present

## 2011-12-09 DIAGNOSIS — N179 Acute kidney failure, unspecified: Secondary | ICD-10-CM | POA: Diagnosis present

## 2011-12-09 DIAGNOSIS — Z5189 Encounter for other specified aftercare: Secondary | ICD-10-CM | POA: Diagnosis not present

## 2011-12-09 DIAGNOSIS — K274 Chronic or unspecified peptic ulcer, site unspecified, with hemorrhage: Secondary | ICD-10-CM | POA: Diagnosis not present

## 2011-12-09 DIAGNOSIS — M810 Age-related osteoporosis without current pathological fracture: Secondary | ICD-10-CM | POA: Diagnosis present

## 2011-12-09 DIAGNOSIS — R279 Unspecified lack of coordination: Secondary | ICD-10-CM | POA: Diagnosis not present

## 2011-12-09 DIAGNOSIS — R509 Fever, unspecified: Secondary | ICD-10-CM | POA: Diagnosis not present

## 2011-12-09 DIAGNOSIS — I6789 Other cerebrovascular disease: Secondary | ICD-10-CM | POA: Diagnosis not present

## 2011-12-09 DIAGNOSIS — R042 Hemoptysis: Secondary | ICD-10-CM | POA: Diagnosis not present

## 2011-12-09 DIAGNOSIS — M25549 Pain in joints of unspecified hand: Secondary | ICD-10-CM | POA: Diagnosis not present

## 2011-12-09 DIAGNOSIS — F411 Generalized anxiety disorder: Secondary | ICD-10-CM | POA: Diagnosis present

## 2011-12-09 DIAGNOSIS — R6889 Other general symptoms and signs: Secondary | ICD-10-CM | POA: Diagnosis not present

## 2011-12-09 DIAGNOSIS — S42209A Unspecified fracture of upper end of unspecified humerus, initial encounter for closed fracture: Secondary | ICD-10-CM | POA: Diagnosis not present

## 2011-12-09 DIAGNOSIS — K5289 Other specified noninfective gastroenteritis and colitis: Secondary | ICD-10-CM | POA: Diagnosis present

## 2011-12-09 DIAGNOSIS — I219 Acute myocardial infarction, unspecified: Secondary | ICD-10-CM | POA: Diagnosis not present

## 2011-12-09 DIAGNOSIS — R000 Tachycardia, unspecified: Secondary | ICD-10-CM | POA: Diagnosis not present

## 2011-12-09 DIAGNOSIS — M25579 Pain in unspecified ankle and joints of unspecified foot: Secondary | ICD-10-CM | POA: Diagnosis not present

## 2011-12-09 DIAGNOSIS — F3289 Other specified depressive episodes: Secondary | ICD-10-CM | POA: Diagnosis not present

## 2011-12-09 DIAGNOSIS — M25519 Pain in unspecified shoulder: Secondary | ICD-10-CM | POA: Diagnosis not present

## 2011-12-09 DIAGNOSIS — M7989 Other specified soft tissue disorders: Secondary | ICD-10-CM | POA: Diagnosis not present

## 2011-12-09 DIAGNOSIS — F341 Dysthymic disorder: Secondary | ICD-10-CM | POA: Diagnosis not present

## 2011-12-09 DIAGNOSIS — A419 Sepsis, unspecified organism: Secondary | ICD-10-CM | POA: Diagnosis not present

## 2011-12-09 DIAGNOSIS — M79609 Pain in unspecified limb: Secondary | ICD-10-CM | POA: Diagnosis not present

## 2011-12-09 DIAGNOSIS — F8089 Other developmental disorders of speech and language: Secondary | ICD-10-CM | POA: Diagnosis not present

## 2011-12-09 DIAGNOSIS — R1310 Dysphagia, unspecified: Secondary | ICD-10-CM | POA: Diagnosis not present

## 2011-12-09 DIAGNOSIS — E2749 Other adrenocortical insufficiency: Secondary | ICD-10-CM | POA: Diagnosis not present

## 2011-12-09 DIAGNOSIS — Z981 Arthrodesis status: Secondary | ICD-10-CM | POA: Diagnosis not present

## 2011-12-09 DIAGNOSIS — Z8673 Personal history of transient ischemic attack (TIA), and cerebral infarction without residual deficits: Secondary | ICD-10-CM | POA: Diagnosis not present

## 2011-12-09 DIAGNOSIS — R079 Chest pain, unspecified: Secondary | ICD-10-CM | POA: Diagnosis not present

## 2011-12-09 DIAGNOSIS — N390 Urinary tract infection, site not specified: Secondary | ICD-10-CM | POA: Diagnosis not present

## 2011-12-09 DIAGNOSIS — I6990 Unspecified sequelae of unspecified cerebrovascular disease: Secondary | ICD-10-CM | POA: Diagnosis not present

## 2011-12-09 DIAGNOSIS — J189 Pneumonia, unspecified organism: Secondary | ICD-10-CM | POA: Diagnosis present

## 2011-12-09 DIAGNOSIS — I679 Cerebrovascular disease, unspecified: Secondary | ICD-10-CM | POA: Diagnosis not present

## 2011-12-09 DIAGNOSIS — M25529 Pain in unspecified elbow: Secondary | ICD-10-CM | POA: Diagnosis not present

## 2011-12-09 DIAGNOSIS — S79929A Unspecified injury of unspecified thigh, initial encounter: Secondary | ICD-10-CM | POA: Diagnosis not present

## 2011-12-09 DIAGNOSIS — M6281 Muscle weakness (generalized): Secondary | ICD-10-CM | POA: Diagnosis not present

## 2011-12-09 DIAGNOSIS — I69959 Hemiplegia and hemiparesis following unspecified cerebrovascular disease affecting unspecified side: Secondary | ICD-10-CM | POA: Diagnosis not present

## 2011-12-09 DIAGNOSIS — R52 Pain, unspecified: Secondary | ICD-10-CM | POA: Diagnosis not present

## 2011-12-09 DIAGNOSIS — I80299 Phlebitis and thrombophlebitis of other deep vessels of unspecified lower extremity: Secondary | ICD-10-CM | POA: Diagnosis not present

## 2011-12-09 DIAGNOSIS — K449 Diaphragmatic hernia without obstruction or gangrene: Secondary | ICD-10-CM | POA: Diagnosis not present

## 2011-12-09 DIAGNOSIS — I252 Old myocardial infarction: Secondary | ICD-10-CM | POA: Diagnosis not present

## 2011-12-09 DIAGNOSIS — Z79899 Other long term (current) drug therapy: Secondary | ICD-10-CM | POA: Diagnosis not present

## 2011-12-09 DIAGNOSIS — J9819 Other pulmonary collapse: Secondary | ICD-10-CM | POA: Diagnosis not present

## 2011-12-09 DIAGNOSIS — L89509 Pressure ulcer of unspecified ankle, unspecified stage: Secondary | ICD-10-CM | POA: Diagnosis not present

## 2011-12-09 DIAGNOSIS — S5290XA Unspecified fracture of unspecified forearm, initial encounter for closed fracture: Secondary | ICD-10-CM | POA: Diagnosis not present

## 2011-12-09 DIAGNOSIS — I10 Essential (primary) hypertension: Secondary | ICD-10-CM | POA: Diagnosis not present

## 2011-12-09 DIAGNOSIS — M542 Cervicalgia: Secondary | ICD-10-CM | POA: Diagnosis not present

## 2011-12-09 DIAGNOSIS — S42213A Unspecified displaced fracture of surgical neck of unspecified humerus, initial encounter for closed fracture: Secondary | ICD-10-CM | POA: Diagnosis not present

## 2011-12-09 DIAGNOSIS — M069 Rheumatoid arthritis, unspecified: Secondary | ICD-10-CM | POA: Diagnosis not present

## 2011-12-09 DIAGNOSIS — E860 Dehydration: Secondary | ICD-10-CM | POA: Diagnosis not present

## 2011-12-09 DIAGNOSIS — D649 Anemia, unspecified: Secondary | ICD-10-CM | POA: Diagnosis not present

## 2011-12-09 DIAGNOSIS — F329 Major depressive disorder, single episode, unspecified: Secondary | ICD-10-CM | POA: Diagnosis present

## 2011-12-09 DIAGNOSIS — R0902 Hypoxemia: Secondary | ICD-10-CM | POA: Diagnosis not present

## 2011-12-09 DIAGNOSIS — G8911 Acute pain due to trauma: Secondary | ICD-10-CM | POA: Diagnosis not present

## 2011-12-09 DIAGNOSIS — R262 Difficulty in walking, not elsewhere classified: Secondary | ICD-10-CM | POA: Diagnosis not present

## 2011-12-09 DIAGNOSIS — I672 Cerebral atherosclerosis: Secondary | ICD-10-CM | POA: Diagnosis not present

## 2011-12-09 DIAGNOSIS — K219 Gastro-esophageal reflux disease without esophagitis: Secondary | ICD-10-CM | POA: Diagnosis present

## 2011-12-09 DIAGNOSIS — M25539 Pain in unspecified wrist: Secondary | ICD-10-CM | POA: Diagnosis not present

## 2011-12-09 DIAGNOSIS — I2510 Atherosclerotic heart disease of native coronary artery without angina pectoris: Secondary | ICD-10-CM | POA: Diagnosis not present

## 2011-12-09 DIAGNOSIS — S42293A Other displaced fracture of upper end of unspecified humerus, initial encounter for closed fracture: Secondary | ICD-10-CM | POA: Diagnosis not present

## 2011-12-09 DIAGNOSIS — I6350 Cerebral infarction due to unspecified occlusion or stenosis of unspecified cerebral artery: Secondary | ICD-10-CM | POA: Diagnosis not present

## 2011-12-09 LAB — COMPREHENSIVE METABOLIC PANEL
AST: 41 U/L — ABNORMAL HIGH (ref 0–37)
Albumin: 2.6 g/dL — ABNORMAL LOW (ref 3.5–5.2)
Alkaline Phosphatase: 64 U/L (ref 39–117)
Chloride: 102 mEq/L (ref 96–112)
Potassium: 4.2 mEq/L (ref 3.5–5.1)
Sodium: 137 mEq/L (ref 135–145)
Total Bilirubin: 0.2 mg/dL — ABNORMAL LOW (ref 0.3–1.2)
Total Protein: 6.6 g/dL (ref 6.0–8.3)

## 2011-12-09 MED ORDER — CEFTRIAXONE SODIUM 1 G IJ SOLR: 1.0000 g | Freq: Once | INTRAMUSCULAR | Status: AC

## 2011-12-09 MED ORDER — PREDNISONE 20 MG PO TABS: 20.0000 mg | ORAL_TABLET | Freq: Every day | ORAL | Status: AC

## 2011-12-09 MED ORDER — DIPHENHYDRAMINE HCL 12.5 MG/5ML PO ELIX
12.5000 mg | ORAL_SOLUTION | Freq: Once | ORAL | Status: AC
  Administered 2011-12-09: 12.5 mg via ORAL
  Filled 2011-12-09: qty 5

## 2011-12-09 NOTE — Progress Notes (Signed)
Have noticed a redness and allergic like swelling on her face, informed the attending provider and get the order for Benadryl. Pt is alert and fully orient with out any other problem, vitals signs is normal.

## 2011-12-09 NOTE — Progress Notes (Signed)
Upon d/c gave her a good bath and removed the IV. Called [facility] 4 times to give the report they have been connecting me to different numbers no one answer the call, left a message and informed this to the CSW.

## 2011-12-09 NOTE — Progress Notes (Signed)
Agree with treatment session.  12/09/2011 Cephus Shelling, PT, DPT [phone]

## 2011-12-09 NOTE — Clinical Social Work Note (Signed)
Pt is ready for discharge today to [facility]. Pt has had her 3 night qualifying stay needed for Medicare, per RNCM. Facility has received discharge summary and is ready to accept pt. Paperwork has been completed. Pt and family are agreeable to discharge plan. [facility] will provide transportation to facility. CSW is signing off as no further needs identified.   Dede Query, MSW, Theresia Majors [phone]

## 2011-12-09 NOTE — Progress Notes (Signed)
Pt's face look much better now swelling and redness reduced.

## 2011-12-09 NOTE — Discharge Summary (Signed)
Physician Discharge Summary  Patient ID: [name] MRN: [id number] DOB/AGE: [birth year] 78 y.o.  Admit date: 12/01/2011 Discharge date: 12/09/2011   Discharge Diagnoses:  Principal Problem:  *Stroke Active Problems:  Dysphagia  Protein-calorie malnutrition, moderate  Gait instability  Pneumonia  SOB (shortness of breath)  Poor appetite  Renal insufficiency  Adrenal insufficiency   Discharged Condition: fair  Hospital Course: The patient is a 78 year old Caucasian woman who presented to the emergency room with a complaint of slurred speech for the past few days prior. They noted that she was also weaker than usual and having trouble swallowing. Her emergency room evaluation included a head CT scan that did not show any acute abnormalities, so she was admitted for further evaluation. Initially she did well with improvement in her speech, however she subsequently developed significant weakness in both lower extremities, so a repeat head MRI exam was done which showed an acute stroke. She had several procedures during her hospitalization including a CT scan of the head without contrast that showed multiple old lacunar infarctions and cerebral and cerebellar atrophy without acute intracranial abnormalities. She had a brain MRA exam that showed significant intracranial atherosclerosis without proximal vessel stenosis. She had 2 brain MRI exams, the first of which showed acute nonhemorrhagic infarct in the mid body of the left corpus callosum and adjacent cingulate gyrus. Three days later, because of a significant change in her neurologic exam, she had a repeat MRI of the brain that showed a punctate new area of restricted diffusion in the most posterior left medial frontal lobe that was felt to account for her weakness in the right lower extremity, and stable infarcts of the corpus callosum and left cingulate gyrus and a punctate area of the right cingulate gyrus, and remote infarcts of the  anterior right frontal lobe and posterior right temporal lobe. She had a transthoracic echocardiogram that showed normal left ventricular systolic function with an estimated ejection fraction of 55-60% with normal wall motion. A carotid ultrasound exam showed no significant stenoses of the internal carotid arteries and showed normal vertebral artery flow. She was seen by consultants from physical therapy, occupational therapy, and speech language pathology. She has been able to do some stand and pivots, and was able to swallow without significant difficulty. At one point she had nausea and vomiting with workup including abdomen x-rays that were normal and a chest x-ray that suggested a left lower lobe atelectasis versus infiltrate, so she was started on Rocephin. Complications of her hospitalization: None. The plan is to have her transferred from the hospital to a skilled nursing facility for continued rehabilitation of her physical deconditioning and gait instability. Of note, her prednisone dose was increased during the hospitalization and will need to be tapered at the SNF.  Consul and pivot turnts: neurology  Significant Diagnostic Studies:  Dg Chest Port 1 View  12/07/2011  *RADIOLOGY REPORT*  Clinical Data: Fever, recent vomiting.  Question aspiration.  PORTABLE CHEST - 1 VIEW  Comparison: Chest radiograph 12/01/2011 and chest CT 05/12/2011  Findings: Thoracic spine fixation rods are present.  Heart size is within normal limits and stable.  Probable hiatal hernia contour, but not as well visualized on today's study as on the recent two- view chest radiograph.  Lung volumes are low bilaterally.  There is a small lateral left basilar opacity.  Right lung appears clear. Upper abdomen shows a nonobstructive bowel gas pattern.  IMPRESSION:  1. Small opacity lateral left lung base could reflect atelectasis or  airspace disease.  2. Probable persistent hiatal hernia.  Original Report Authenticated By: Britta Mccreedy, M.D.    Labs: Lab Results  Component Value Date   WBC 15.2* 12/08/2011   HGB 10.9* 12/08/2011   HCT 33.4* 12/08/2011   MCV 90.0 12/08/2011   PLT 147* 12/08/2011     Lab 12/09/11 0500  NA 137  K 4.2  CL 102  CO2 23  BUN 48*  CREATININE 1.34*  CALCIUM 9.1  PROT 6.6  BILITOT 0.2*  ALKPHOS 64  ALT 30  AST 41*  GLUCOSE 146*       Lab Results  Component Value Date   INR 1.03 12/01/2011   INR 1.00 05/11/2011   INR 0.92 07/06/2010     Recent Results (from the past 240 hour(s))  URINE CULTURE     Status: Normal   Collection Time   12/01/11  4:32 PM      Component Value Range Status Comment   Specimen Description URINE, CATHETERIZED   Final    Special Requests NONE   Final    Culture  Setup Time [id number]   Final    Colony Count NO GROWTH   Final    Culture NO GROWTH   Final    Report Status 12/02/2011 FINAL   Final   URINE CULTURE     Status: Normal   Collection Time   12/07/11  2:10 AM      Component Value Range Status Comment   Specimen Description URINE, CATHETERIZED   Final    Special Requests NONE   Final    Culture  Setup Time [id number]   Final    Colony Count NO GROWTH   Final    Culture NO GROWTH   Final    Report Status 12/08/2011 FINAL   Final       Discharge Exam: Blood pressure 146/84, pulse 90, temperature 98.2 F (36.8 C), temperature source Oral, resp. rate 18, height 5\' 6"  (1.676 m), weight 68.08 kg (150 lb 1.4 oz), SpO2 97.00%.  Physical Exam:  in general, she is a elderly white woman who was sleepy but easily awoken while lying at 30 elevation head of bed. Her speech is soft but understandable. HEENT exam was within normal limits and without facial droop. Neck was supple and without jugular venous distention or carotid bruit, chest was clear to auscultation, heart had a regular rate and rhythm and was without significant murmur or gallop, abdomen had normal bowel sounds no tenderness, extremities were without cyanosis, clubbing, or edema.  Neurologic exam: She was sleepy but easily awoken and able to answer questions appropriately, cranial nerves II through XII are normal, she had 4-5 left lower extremity strength, 5 out of 5 left upper extremity strength, 4-5 right upper extremity strength, and 3/5 right lower extremity strength.   Disposition: she will be discharged from the hospital and transferred to a skilled nursing facility for rehabilitation. At the completion a skilled nursing facility rehabilitation she should  Schedule a followup visit with Dr. Geoffry Paradise as described below.   Discharge Orders    Future Orders Please Complete By Expires   Diet - low sodium heart healthy      Diet - low sodium heart healthy      Increase activity slowly      Increase activity slowly      Discharge instructions      Comments:   She'll be discharged to a skilled nursing facility for continued rehabilitation after her  recent stroke. Following completion of her skilled nursing facility stay she should schedule a followup visit with her primary care physician. She can schedule that visit by calling [phone]     Medication List  As of 12/09/2011 10:02 AM   STOP taking these medications         aspirin 81 MG tablet      CALCIUM PO      HYDROcodone-acetaminophen 5-500 MG per tablet      imipramine 50 MG tablet         TAKE these medications         acetaminophen 500 MG tablet   Commonly known as: TYLENOL   Take 500 mg by mouth every 6 (six) hours as needed. For pain      aspirin 325 MG EC tablet   Take 1 tablet (325 mg total) by mouth daily.      cefTRIAXone 1 G injection   Commonly known as: ROCEPHIN   Inject 1 g into the muscle once.      Cholecalciferol 10000 UNITS Caps   Take 1,000 Units by mouth daily.      diazepam 5 MG tablet   Commonly known as: VALIUM   Take 1 tablet (5 mg total) by mouth at bedtime.      esomeprazole 40 MG capsule   Commonly known as: NEXIUM   Take 40 mg by mouth daily before breakfast.       Fish Oil 1200 MG Caps   Take 1,200 mg by mouth daily.      folic acid 1 MG tablet   Commonly known as: FOLVITE   Take 1 mg by mouth daily.      HYDROcodone-acetaminophen 5-325 MG per tablet   Commonly known as: NORCO   Take 1 tablet by mouth every 4 (four) hours as needed.      levothyroxine 50 MCG tablet   Commonly known as: SYNTHROID, LEVOTHROID   Take 50 mcg by mouth daily.      lisinopril-hydrochlorothiazide 20-12.5 MG per tablet   Commonly known as: PRINZIDE,ZESTORETIC   Take 1 tablet by mouth daily.      metoprolol 50 MG tablet   Commonly known as: LOPRESSOR   Take 50 mg by mouth 2 (two) times daily.      multivitamin tablet   Take 1 tablet by mouth daily.      predniSONE 5 MG tablet   Commonly known as: DELTASONE   Take 5 mg by mouth daily.      predniSONE 20 MG tablet   Commonly known as: DELTASONE   Take 1 tablet (20 mg total) by mouth daily before breakfast.      rosuvastatin 20 MG tablet   Commonly known as: CRESTOR   Take 20 mg by mouth daily.      sertraline 50 MG tablet   Commonly known as: ZOLOFT   Take 50 mg by mouth daily.      VITAMIN C PO   Take 500 mg by mouth daily.             Signed: Garlan Fillers 12/09/2011, 10:02 AM

## 2011-12-09 NOTE — Progress Notes (Signed)
Physical Therapy Treatment Patient Details Name: [name] MRN: [id number] DOB: [birth year] Today's Date: 12/09/2011 Time: 9528-4132 PT Time Calculation (min): 21 min  PT Assessment / Plan / Recommendation Comments on Treatment Session  Pt s/p L CVA and c/o fatigue today. Willing to do bed mobility to get her cleaned up, needing lots of  verbal cues and assist to trunk. Pt would continue to benefit from PT to facilitate a safe D/C to SNF.     Follow Up Recommendations  Skilled nursing facility    Barriers to Discharge        Equipment Recommendations  Defer to next venue    Recommendations for Other Services    Frequency Min 4X/week   Plan Discharge plan remains appropriate;Frequency remains appropriate    Precautions / Restrictions Precautions Precautions: Fall Restrictions Weight Bearing Restrictions: No   Pertinent Vitals/Pain N/A    Mobility  Bed Mobility Bed Mobility: Rolling Right;Rolling Left Rolling Right: 4: Min assist;With rail Rolling Left: 4: Min assist;With rail Details for Bed Mobility Assistance: Assist to roll each direction.  Cues for hand placement and sequence.  Transfers Transfers: Not assessed Ambulation/Gait Ambulation/Gait Assistance: Not tested (comment) Stairs: No Wheelchair Mobility Wheelchair Mobility: No Modified Rankin (Stroke Patients Only) Pre-Morbid Rankin Score: No symptoms Modified Rankin: Moderately severe disability    Exercises     PT Diagnosis:    PT Problem List:   PT Treatment Interventions:     PT Goals Acute Rehab PT Goals PT Goal Formulation: With patient Time For Goal Achievement: 12/16/11 Potential to Achieve Goals: Good  Visit Information  Last PT Received On: 12/09/11 Assistance Needed: +2    Subjective Data  Subjective: "I'll try for you all." Patient Stated Goal: Go home eventually.    Cognition  Overall Cognitive Status: Appears within functional limits for tasks  assessed/performed Arousal/Alertness: Awake/alert Orientation Level: Appears intact for tasks assessed Behavior During Session: [facility] for tasks performed    Balance  Balance Balance Assessed: No  End of Session PT - End of Session Activity Tolerance: Patient limited by fatigue;Patient limited by pain Patient left: in bed;with call bell/phone within reach Nurse Communication: Mobility status    [name] 12/09/2011, 2:27 PM

## 2011-12-09 NOTE — Progress Notes (Signed)
Patient's alert and oriented, soft spoken; assisted with activities and ADL's as needed; right peripheral IV access positional causing constant alarming of IV pump- offered to restart site, however patient refused IV restart; stated she might go home today; PO fluids offered and patient tolerated liquids without problem.

## 2011-12-15 DIAGNOSIS — M069 Rheumatoid arthritis, unspecified: Secondary | ICD-10-CM | POA: Diagnosis not present

## 2011-12-15 DIAGNOSIS — I6789 Other cerebrovascular disease: Secondary | ICD-10-CM | POA: Diagnosis not present

## 2011-12-15 DIAGNOSIS — I10 Essential (primary) hypertension: Secondary | ICD-10-CM | POA: Diagnosis not present

## 2011-12-15 DIAGNOSIS — I2510 Atherosclerotic heart disease of native coronary artery without angina pectoris: Secondary | ICD-10-CM | POA: Diagnosis not present

## 2011-12-15 DIAGNOSIS — K274 Chronic or unspecified peptic ulcer, site unspecified, with hemorrhage: Secondary | ICD-10-CM | POA: Diagnosis not present

## 2011-12-29 ENCOUNTER — Emergency Department (HOSPITAL_COMMUNITY)
Admission: EM | Admit: 2011-12-29 | Discharge: 2011-12-29 | Disposition: A | Discharge status: Skilled Nursing Facility | Payer: Medicare Other | Attending: Emergency Medicine | Admitting: Emergency Medicine

## 2011-12-29 ENCOUNTER — Emergency Department (HOSPITAL_COMMUNITY): Payer: Medicare Other

## 2011-12-29 ENCOUNTER — Encounter (HOSPITAL_COMMUNITY): Payer: Self-pay | Admitting: *Deleted

## 2011-12-29 DIAGNOSIS — W19XXXA Unspecified fall, initial encounter: Secondary | ICD-10-CM

## 2011-12-29 DIAGNOSIS — M25559 Pain in unspecified hip: Secondary | ICD-10-CM | POA: Insufficient documentation

## 2011-12-29 DIAGNOSIS — S42213A Unspecified displaced fracture of surgical neck of unspecified humerus, initial encounter for closed fracture: Secondary | ICD-10-CM | POA: Diagnosis not present

## 2011-12-29 DIAGNOSIS — S42209A Unspecified fracture of upper end of unspecified humerus, initial encounter for closed fracture: Secondary | ICD-10-CM | POA: Diagnosis not present

## 2011-12-29 DIAGNOSIS — M129 Arthropathy, unspecified: Secondary | ICD-10-CM | POA: Insufficient documentation

## 2011-12-29 DIAGNOSIS — M25519 Pain in unspecified shoulder: Secondary | ICD-10-CM | POA: Diagnosis not present

## 2011-12-29 DIAGNOSIS — Z79899 Other long term (current) drug therapy: Secondary | ICD-10-CM | POA: Insufficient documentation

## 2011-12-29 DIAGNOSIS — M069 Rheumatoid arthritis, unspecified: Secondary | ICD-10-CM | POA: Insufficient documentation

## 2011-12-29 DIAGNOSIS — I252 Old myocardial infarction: Secondary | ICD-10-CM | POA: Insufficient documentation

## 2011-12-29 DIAGNOSIS — E039 Hypothyroidism, unspecified: Secondary | ICD-10-CM | POA: Insufficient documentation

## 2011-12-29 DIAGNOSIS — K219 Gastro-esophageal reflux disease without esophagitis: Secondary | ICD-10-CM | POA: Insufficient documentation

## 2011-12-29 DIAGNOSIS — S42293A Other displaced fracture of upper end of unspecified humerus, initial encounter for closed fracture: Secondary | ICD-10-CM | POA: Diagnosis not present

## 2011-12-29 DIAGNOSIS — G8911 Acute pain due to trauma: Secondary | ICD-10-CM | POA: Diagnosis not present

## 2011-12-29 DIAGNOSIS — W010XXA Fall on same level from slipping, tripping and stumbling without subsequent striking against object, initial encounter: Secondary | ICD-10-CM | POA: Insufficient documentation

## 2011-12-29 DIAGNOSIS — Z8673 Personal history of transient ischemic attack (TIA), and cerebral infarction without residual deficits: Secondary | ICD-10-CM | POA: Insufficient documentation

## 2011-12-29 DIAGNOSIS — M25529 Pain in unspecified elbow: Secondary | ICD-10-CM | POA: Diagnosis not present

## 2011-12-29 DIAGNOSIS — I2510 Atherosclerotic heart disease of native coronary artery without angina pectoris: Secondary | ICD-10-CM | POA: Insufficient documentation

## 2011-12-29 DIAGNOSIS — M810 Age-related osteoporosis without current pathological fracture: Secondary | ICD-10-CM | POA: Insufficient documentation

## 2011-12-29 DIAGNOSIS — F411 Generalized anxiety disorder: Secondary | ICD-10-CM | POA: Insufficient documentation

## 2011-12-29 DIAGNOSIS — M79609 Pain in unspecified limb: Secondary | ICD-10-CM | POA: Diagnosis not present

## 2011-12-29 DIAGNOSIS — F329 Major depressive disorder, single episode, unspecified: Secondary | ICD-10-CM | POA: Insufficient documentation

## 2011-12-29 DIAGNOSIS — F3289 Other specified depressive episodes: Secondary | ICD-10-CM | POA: Insufficient documentation

## 2011-12-29 DIAGNOSIS — S79919A Unspecified injury of unspecified hip, initial encounter: Secondary | ICD-10-CM | POA: Diagnosis not present

## 2011-12-29 HISTORY — DX: Unspecified fall, initial encounter: W19.XXXA

## 2011-12-29 MED ORDER — ONDANSETRON HCL 8 MG PO TABS: 8.0000 mg | ORAL_TABLET | Freq: Three times a day (TID) | ORAL | Status: AC | PRN

## 2011-12-29 MED ORDER — HYDROCODONE-ACETAMINOPHEN 5-500 MG PO TABS: 1.0000 | ORAL_TABLET | Freq: Four times a day (QID) | ORAL | Status: AC | PRN

## 2011-12-29 MED ORDER — ONDANSETRON 8 MG PO TBDP
8.0000 mg | ORAL_TABLET | Freq: Once | ORAL | Status: AC
  Administered 2011-12-29: 8 mg via ORAL
  Filled 2011-12-29: qty 1

## 2011-12-29 MED ORDER — HYDROCODONE-ACETAMINOPHEN 5-325 MG PO TABS
2.0000 | ORAL_TABLET | Freq: Once | ORAL | Status: AC
  Administered 2011-12-29: 2 via ORAL
  Filled 2011-12-29: qty 2

## 2011-12-29 NOTE — ED Notes (Signed)
[facility] contacted for transport back to [facility] 

## 2011-12-29 NOTE — Discharge Instructions (Signed)
Wear shoulder immobilizer/sling. Ice/coldpack to sore area. Take motrin or aleve as need for pain. You may also take vicodin as need for pain. Also, do not take tylenol or acetaminophen containing medication when taking vicodin. You may take zofran as need for nausea (as the pain medication may cause nausea). Follow up with orthopedist in 1 week - call office to arrange appointment.  Return to ER if worse, new symptoms, numbness/weakness, other concern.      Shoulder Fracture (Proximal Humerus or Glenoid) A shoulder fracture is a broken upper arm bone or a broken socket bone. The humerus is the upper arm bone and the glenoid is the shoulder socket. Proximal means the humerus is broken near the shoulder. Most of the time the bones of a broken shoulder are in an acceptable position. Usually, the injury can be treated with a shoulder immobilizer or sling and swath bandage. These devices support the arm and prevent any shoulder movement. If the bones are not in a good position, then surgery is sometimes needed. Shoulder fractures usually initially cause swelling, pain, and discoloration around the upper arm. They heal in 8 to 12 weeks with proper treatment. SYMPTOMS  At the time of injury:  Pain.   Tenderness.   Regular body contours are not normal.  Later symptoms may include:  Swelling and bruising of the elbow and hand.   Swelling and bruising of the arm or chest.  Other symptoms include:  Pain when lifting or turning the arm.   Paralysis below the fracture.   Numbness or coldness below the fracture.  CAUSES   Indirect force from falling on an outstretched arm.   A blow to the shoulder.  RISK INCREASES WITH:  Not being in shape.   Playing contact sports, such as football, soccer, hockey, or rugby.   Sports where falling on an outstretched arm occurs, such as basketball, skateboarding, or volleyball.   History of bone or joint disease.   History of shoulder injury.    PREVENTION  Warm up before activity.   Stretch before activity.   Stay in shape with your:   Heart fitness.   Flexibility.   Shoulder Strength.   Falling with the proper technique.  PROGNOSIS  In adults, healing time is about 7 weeks. For children, healing time is about 5 weeks. Surgery may be needed. RELATED COMPLICATIONS  The bones do not heal together (nonunion).   The bones do not align properly when they heal (malunion).   Long-term problems with pain, stiffness, swelling, or loss of motion.   The injured arm heals shorter than the other.   Nerves are injured in the arm.   Arthritis in the shoulder.   Normal bone growth is interrupted in children.   Blood supply to the shoulder joint is diminished.  TREATMENT If the bones are aligned, then initial treatment will be with ice and medicine to help with pain. The shoulder will be held in place with a sling (immobilization). The shoulder will be allowed to heal for up to 6 weeks. Injuries that may need surgery include:  Severe fractures.   Fractures that are not in appropriate alignment (displaced).   Non-displaced fractures (not common).  Surgery helps the bones align correctly. The bones may be held in place with:  Sutures.   Wires.   Rods.   Plates.   Screws.   Pins.  If you have had surgery or not, you will likely be assisted by a physical therapist or athletic trainer to  get the best results with your injured shoulder. This will likely include exercises to strengthen and stretch the injured and surrounding areas. MEDICATION  If pain medicine is needed, nonsteroidal anti-inflammatory medicines (such as aspirin or ibuprofen) or other minor pain relievers (such as acetaminophen) are often advised.   Do not take pain medicine for 7 days before surgery.   Stronger pain relievers may be prescribed. Use only as directed and take only as much as you need.  COLD THERAPY Cold treatment (icing) relieves  pain and reduces inflammation. Cold treatment should be applied for 10 to 15 minutes every 2 to 3 hours, and immediately after activity that aggravates your symptoms. Use ice packs or an ice massage. SEEK IMMEDIATE MEDICAL CARE IF:  You have severe shoulder pain unrelieved by rest and taking pain medicine.   You have pain, numbness, tingling, or weakness in the hand or wrist.   You have shortness of breath, chest pain, severe weakness, or fainting.   You have severe pain with motion of the fingers or wrist.   Blue, gray, or dark color appears in the fingernails on injured extremity.  Document Released: 06/16/2005 Document Revised: 06/05/2011 Document Reviewed: 09/28/2008 [facility] Patient Information 2012 [place], [place].      Cryotherapy Cryotherapy means treatment with cold. Ice or gel packs can be used to reduce both pain and swelling. Ice is the most helpful within the first 24 to 48 hours after an injury or flareup from overusing a muscle or joint. Sprains, strains, spasms, burning pain, shooting pain, and aches can all be eased with ice. Ice can also be used when recovering from surgery. Ice is effective, has very few side effects, and is safe for most people to use. PRECAUTIONS  Ice is not a safe treatment option for people with:  Raynaud's phenomenon. This is a condition affecting small blood vessels in the extremities. Exposure to cold may cause your problems to return.   Cold hypersensitivity. There are many forms of cold hypersensitivity, including:   Cold urticaria. Red, itchy hives appear on the skin when the tissues begin to warm after being iced.   Cold erythema. This is a red, itchy rash caused by exposure to cold.   Cold hemoglobinuria. Red blood cells break down when the tissues begin to warm after being iced. The hemoglobin that carry oxygen are passed into the urine because they cannot combine with blood proteins fast enough.   Numbness or altered sensitivity in  the area being iced.  If you have any of the following conditions, do not use ice until you have discussed cryotherapy with your caregiver:  Heart conditions, such as arrhythmia, angina, or chronic heart disease.   High blood pressure.   Healing wounds or open skin in the area being iced.   Current infections.   Rheumatoid arthritis.   Poor circulation.   Diabetes.  Ice slows the blood flow in the region it is applied. This is beneficial when trying to stop inflamed tissues from spreading irritating chemicals to surrounding tissues. However, if you expose your skin to cold temperatures for too long or without the proper protection, you can damage your skin or nerves. Watch for signs of skin damage due to cold. HOME CARE INSTRUCTIONS Follow these tips to use ice and cold packs safely.  Place a dry or damp towel between the ice and skin. A damp towel will cool the skin more quickly, so you may need to shorten the time that the ice is used.  For a more rapid response, add gentle compression to the ice.   Ice for no more than 10 to 20 minutes at a time. The bonier the area you are icing, the less time it will take to get the benefits of ice.   Check your skin after 5 minutes to make sure there are no signs of a poor response to cold or skin damage.   Rest 20 minutes or more in between uses.   Once your skin is numb, you can end your treatment. You can test numbness by very lightly touching your skin. The touch should be so light that you do not see the skin dimple from the pressure of your fingertip. When using ice, most people will feel these normal sensations in this order: cold, burning, aching, and numbness.   Do not use ice on someone who cannot communicate their responses to pain, such as small children or people with dementia.  HOW TO MAKE AN ICE PACK Ice packs are the most common way to use ice therapy. Other methods include ice massage, ice baths, and cryo-sprays. Muscle  creams that cause a cold, tingly feeling do not offer the same benefits that ice offers and should not be used as a substitute unless recommended by your caregiver. To make an ice pack, do one of the following:  Place crushed ice or a bag of frozen vegetables in a sealable plastic bag. Squeeze out the excess air. Place this bag inside another plastic bag. Slide the bag into a pillowcase or place a damp towel between your skin and the bag.   Mix 3 parts water with 1 part rubbing alcohol. Freeze the mixture in a sealable plastic bag. When you remove the mixture from the freezer, it will be slushy. Squeeze out the excess air. Place this bag inside another plastic bag. Slide the bag into a pillowcase or place a damp towel between your skin and the bag.  SEEK MEDICAL CARE IF:  You develop white spots on your skin. This may give the skin a blotchy (mottled) appearance.   Your skin turns blue or pale.   Your skin becomes waxy or hard.   Your swelling gets worse.  MAKE SURE YOU:   Understand these instructions.   Will watch your condition.   Will get help right away if you are not doing well or get worse.  Document Released: 02/10/2011 Document Revised: 06/05/2011 Document Reviewed: 02/10/2011 [facility] Patient Information 2012 [place], [place].     Shoulder Immobilizer Your doctor has given you a shoulder immobilizer. This can be used to treat shoulder fractures and dislocations. It keeps the arm supported next to the body, and prevents it from swinging loose and from further injury or pain. Shoulder fractures and dislocations usually take 4-6 weeks to heal. HOME CARE INSTRUCTIONS  To reduce irritation in your armpit, use powder or pads to absorb any sweat.   Your immobilizer may be removed and washed as directed, but do not use your arm for any work out of the immobilizer unless your doctor approves.   Always wear your immobilizer at night.   Call your doctor if you have any questions  about your injury or how to use this device.  Document Released: 07/24/2004 Document Revised: 06/05/2011 Document Reviewed: 06/03/2007 [facility] Patient Information 2012 [place], [place].      Home Safety and Preventing Falls Falls are a leading cause of injury and while they affect all age groups, falls have greater short-term and long-term impact  on older age groups. However, falls should not be a part of life or aging. It is possible for individuals and their families to use preventive measures to significantly decrease the likelihood that anyone, especially an older adult, will fall. There are many simple measures which can make your home safer with respect to preventing falls. The following actions can help reduce falls among all members of your family and are especially important as you age, when your balance, lower limb strength, coordination, and eyesight may be declining. The use of preventive measures will help to reduce you and your family's risk of falls and serious medical consequences. OUTDOORS  Repair cracks and edges of walkways and driveways.   Remove high doorway thresholds and trim shrubbery on the main path into your home.   Ensure there is good outside lighting at main entrances and along main walkways.   Clear walkways of tools, rocks, debris, and clutter.   Check that handrails are not broken and are securely fastened. Both sides of steps should have handrails.   In the garage, be attentive to and clean up grease or oil spills on the cement. This can make the surface extremely slippery.   In winter, have leaves, snow, and ice cleared regularly.   Use sand or salt on walkways during winter months.  BATHROOM  Install grab bars by the toilet and in the tub and shower.   Use non-skid mats or decals in the tub or shower.   If unable to easily stand unsupported while showering, place a plastic non slip stool in the shower to sit on when needed.   Install night  lights.   Keep floors dry and clean up all water on the floor immediately.   Remove soap buildup in tub or shower on a regular basis.   Secure bath mats with non-slip, double-sided rug tape.   Remove tripping hazards from the floors.  BEDROOMS  Install night lights.   Do not use oversized bedding.   Make sure a bedside light is easy to reach.   Keep a telephone by your bedside.   Make sure that you can get in and out of your bed easily.   Have a firm chair, with side arms, to use for getting dressed.   Remove clutter from around closets.   Store clothing, bed coverings, and other household items where you can reach them comfortably.   Remove tripping hazards from the floor.  LIVING AREAS AND STAIRWAYS  Turn on lights to avoid having to walk through dark areas.   Keep lighting uniform in each room. Place brighter lightbulbs in darker areas, including stairways.   Replace lightbulbs that burn out in stairways immediately.   Arrange furniture to provide for clear pathways.   Keep furniture in the same place.   Eliminate or tape down electrical cables in high traffic areas.   Place handrails on both sides of stairways. Use handrails when going up or down stairs.   Most falls occur on the top or bottom 3 steps.   Fix any loose handrails. Make sure handrails on both sides of the stairways are as long as the stairs.   Remove all walkway obstacles.   Coil or tape electrical cords off to the side of walking areas and out of the way. If using many extension cords, have an electrician put in a new wall outlet to reduce or eliminate them.   Make sure spills are cleaned up quickly and allow time for drying before  walking on freshly cleaned floors.   Firmly attach carpet with non-skid or two-sided tape.   Keep frequently used items within easy reach.   Remove tripping hazards such as throw rugs and clutter in walkways. Never leave objects on stairs.   Get rid of throw  rugs elsewhere if possible.   Eliminate uneven floor surfaces.   Make sure couches and chairs are easy to get into and out of.   Check carpeting to make sure it is firmly attached along stairs.   Make repairs to worn or loose carpet promptly.   Select a carpet pattern that does not visually hide the edge of steps.   Avoid placing throw rugs or scatter rugs at the top or bottom of stairways, or properly secure with carpet tape to prevent slippage.   Have an electrician put in a light switch at the top and bottom of the stairs.   Get light switches that glow.   Avoid the following practices: hurrying, inattention, obscured vision, carrying large loads, and wearing slip-on shoes.   Be aware of all pets.  KITCHEN  Place items that are used frequently, such as dishes and food, within easy reach.   Keep handles on pots and pans toward the center of the stove. Use back burners when possible.   Make sure spills are cleaned up quickly and allow time for drying.   Avoid walking on wet floors.   Avoid hot utensils and knives.   Position shelves so they are not too high or low.   Place commonly used objects within easy reach.   If necessary, use a sturdy step stool with a grab bar when reaching.   Make sure electrical cables are out of the way.   Do not use floor polish or wax that makes floors slippery.  OTHER HOME FALL PREVENTION STRATEGIES  Wear low heel or rubber sole shoes that are supportive and fit well.   Wear closed toe shoes.   Know and watch for side effects of medications. Have your caregiver or pharmacist look at all your medicines, even over-the-counter medicines. Some medicines can make you sleepy or dizzy.   Exercise regularly. Exercise makes you stronger and improves your balance and coordination.   Limit use of alcohol.   Use eyeglasses if necessary and keep them clean. Have your vision checked every year.   Organize your household in a manner that  minimizes the need to walk distances when hurried, or go up and down stairs unnecessarily. For example, have a phone placed on at least each floor of your home. If possible, have a phone beside each sitting or lying area where you spend the most time at home. Keep emergency numbers posted at all phones.   Use non-skid floor wax.   When using a ladder, make sure:   The base is firm.   All ladder feet are on level ground.   The ladder is angled against the wall properly.   When climbing a ladder, face the ladder and hold the ladder rungs firmly.   If reaching, always keep your hips and body weight centered between the rails.   When using a stepladder, make sure it is fully opened and both spreaders are firmly locked.   Do not climb a closed stepladder.   Avoid climbing beyond the second step from the top of a stepladder and the 4th rung from the top of an extension ladder.   Learn and use mobility aids as needed.   Change  positions slowly. Arise slowly from sitting and lying positions. Sit on the edge of your bed before getting to your feet.   If you have a history of falls, ask someone to add color or contrast paint or tape to grab bars and handrails in your home.   If you have a history of falls, ask someone to place contrasting color strips on first and last steps.   Install an electrical emergency response system if you need one, and know how to use it.   If you have a medical or other condition that causes you to have limited physical strength, it is important that you reach out to family and friends for occasional help.  FOR CHILDREN:  If young children are in the home, use safety gates. At the top of stairs use screw-mounted gates; use pressure-mounted gates for the bottom of the stairs and doorways between rooms.   Young children should be taught to descend stairs on their stomachs, feet first, and later using the handrail.   Keep drawers fully closed to prevent them from  being climbed on or pulled out entirely.   Move chairs, cribs, beds and other furniture away from windows.   Consider installing window guards on windows ground floor and up, unless they are emergency fire exits. Make sure they have easy release mechanisms.   Consider installing special locks that only allow the window to be opened to a certain height.   Never rely on window screens to prevent falls.   Never leave babies alone on changing tables, beds or sofas. Use a changing table that has a restraining strap.   When a child can pull to a standing position, the crib mattress should be adjusted to its lowest position. There should be at least 26 inches between the top rails of the crib drop side and the mattress. Toys, bumper pads, and other objects that can be used as steps to climb out should be removed from the crib.   On bunk beds never allow a child under age 48 to sleep on the top bunk. For older children, if the upper bunk is not against a wall, use guard rails on both sides. No matter how old a child is, keep the guard rails in place on the top bunk since children roll during sleep. Do not permit horseplay on bunks.   Grass and soil surfaces beneath backyard playground equipment should be replaced with hardwood chips, shredded wood mulch, sand, pea gravel, rubber, crushed stone, or another safer material at depths of at least 9 to 12 inches.   When riding bikes or using skates, skateboards, skis, or snowboards, require children to wear helmets. Look for those that have stickers stating that they meet or exceed safety standards.   Vertical posts or pickets in deck, balcony, and stairway railings should be no more than 3 1/2 inches apart if a young baby will have access to the area. The space between horizontal rails or bars, and between the floor and the first horizontal rail or bar, should be no more than 3 1/2 inches.  Document Released: 06/06/2002 Document Revised: 06/05/2011 Document  Reviewed: 04/05/2009 [facility] Patient Information 2012 [place], [place].

## 2011-12-29 NOTE — ED Notes (Signed)
Daughter: Cathlean Cower May  Cell  [phone] Home  [phone]

## 2011-12-29 NOTE — ED Notes (Signed)
Bed:WHALG<BR> Expected date:<BR> Expected time:<BR> Means of arrival:<BR> Comments:<BR> closed

## 2011-12-29 NOTE — ED Notes (Signed)
Per EMS pt from rehab facility, fell last Thursday during rehab, they did xrays there and came back negative, but pt still complaining of R shoulder/elbow and hip pain, shoulder and elbow pain are worse than hip, during movement pt rates pain a 9/10. Hx of CVA.

## 2011-12-29 NOTE — ED Notes (Signed)
Pt states she's at a rehab facility x 3 weeks d/t 2 CVA's, was walking with the OT last Thursday when she fell on R side, complaining of R shoulder/elbow and hip pain, did xrays at the rehab facility that came back negative but pt still states she's hurting, pt states R shoulder/elbow hurts worse than hip. Bruising noted to R arm, no bruising seen to R hip.

## 2011-12-29 NOTE — ED Provider Notes (Signed)
History     CSN: [id number]  Arrival date & time 12/29/11  1329   First MD Initiated Contact with Patient 12/29/11 1508      Chief Complaint  Patient presents with  . Fall  . Hip Pain    right  . Shoulder Pain    right  . Elbow Pain    right    (Consider location/radiation/quality/duration/timing/severity/associated sxs/prior treatment) Patient is a 78 y.o. female presenting with fall, hip pain, and shoulder pain. The history is provided by the patient.  Fall Pertinent negatives include no fever, no numbness, no abdominal pain, no vomiting and no headaches.  Hip Pain Pertinent negatives include no chest pain, no abdominal pain, no headaches and no shortness of breath.  Shoulder Pain Pertinent negatives include no chest pain, no abdominal pain, no headaches and no shortness of breath.  pt c/o fall 4 days ago at [facility]. States had recent prior stroke, states not ambulatory by herself at baseline, had tried to get up, lost balance and fell. Denies loc. Denies faintness or dizziness. States had mobile xrays w no definite fx but pain persists. No recurrent fall. No headache. No nv. No neck or back pain. No cp or sob. No abd pain. C/o right shoulder, right elbow and right hip pain. No numbness/weakness.     Past Medical History  Diagnosis Date  . SOB (shortness of breath)   . MI (myocardial infarction)   . Poor appetite   . Arthritis   . Fatigue   . Right ear pain   . Renal insufficiency   . RA (rheumatoid arthritis)   . PUD (peptic ulcer disease)   . GI bleed 2005  . GERD (gastroesophageal reflux disease)   . Hiatal hernia   . Hypothyroidism   . Osteoporosis   . Depression   . Anxiety   . Coronary artery disease   . CVA (cerebral vascular accident)   . TIA (transient ischemic attack)   . Fall     Past Surgical History  Procedure Date  . Cardiac catheterization     SHOWED RUPTURE PLAQUE IN THE LAD. THE LAD IS NONOBSTRUCTIVE WITH ONLY 30-40% STENOSIS  . Nstemi 06/2010    . Spinal fusion surgery   . Knee surgery     Family History  Problem Relation Age of Onset  . Kidney disease Mother   . Kidney disease Brother     History  Substance Use Topics  . Smoking status: Never Smoker   . Smokeless tobacco: Never Used  . Alcohol Use: No    OB History    Grav Para Term Preterm Abortions TAB SAB Ect Mult Living                  Review of Systems  Constitutional: Negative for fever and chills.  HENT: Negative for neck pain.   Eyes: Negative for visual disturbance.  Respiratory: Negative for shortness of breath.   Cardiovascular: Negative for chest pain and leg swelling.  Gastrointestinal: Negative for vomiting and abdominal pain.  Genitourinary: Negative for flank pain.  Musculoskeletal: Negative for back pain.  Skin: Negative for rash.  Neurological: Negative for weakness, numbness and headaches.  Hematological: Does not bruise/bleed easily.    Allergies  Codeine; Percocet; Plavix; and Sulfur  Home Medications   Current Outpatient Rx  Name Route Sig Dispense Refill  . ACETAMINOPHEN 500 MG PO TABS Oral Take 500 mg by mouth every 6 (six) hours as needed. For pain    .  VITAMIN C PO Oral Take 500 mg by mouth daily.     . CHOLECALCIFEROL 10000 UNITS PO CAPS Oral Take 1,000 Units by mouth daily.    Marland Kitchen DIAZEPAM 5 MG PO TABS Oral Take 5 mg by mouth at bedtime.    Marland Kitchen ESOMEPRAZOLE MAGNESIUM 40 MG PO CPDR Oral Take 40 mg by mouth daily before breakfast.      . FOLIC ACID 1 MG PO TABS Oral Take 1 mg by mouth daily.      Marland Kitchen HYDROCODONE-ACETAMINOPHEN 5-325 MG PO TABS Oral Take 1 tablet by mouth every 6 (six) hours as needed. Pain    . LEVOTHYROXINE SODIUM 50 MCG PO TABS Oral Take 50 mcg by mouth daily.     Marland Kitchen LISINOPRIL-HYDROCHLOROTHIAZIDE 20-12.5 MG PO TABS Oral Take 1 tablet by mouth daily.      Marland Kitchen METOPROLOL TARTRATE 50 MG PO TABS Oral Take 50 mg by mouth 2 (two) times daily.      Marland Kitchen ONE-DAILY MULTI VITAMINS PO TABS Oral Take 1 tablet by mouth daily.       Marland Kitchen FISH OIL 1200 MG PO CAPS Oral Take 1,200 mg by mouth daily.    Marland Kitchen PREDNISONE 5 MG PO TABS Oral Take 10 mg by mouth daily.     Marland Kitchen ROSUVASTATIN CALCIUM 20 MG PO TABS Oral Take 20 mg by mouth daily.     . SERTRALINE HCL 50 MG PO TABS Oral Take 50 mg by mouth daily.        BP 117/56  Pulse 85  Temp 98.8 F (37.1 C) (Oral)  Resp 18  SpO2 97%  Physical Exam  Nursing note and vitals reviewed. Constitutional: She appears well-developed and well-nourished. No distress.  HENT:  Head: Atraumatic.  Eyes: Conjunctivae are normal. Pupils are equal, round, and reactive to light. No scleral icterus.  Neck: Neck supple. No tracheal deviation present.  Cardiovascular: Normal rate, regular rhythm, normal heart sounds and intact distal pulses.   Pulmonary/Chest: Effort normal and breath sounds normal. No respiratory distress. She exhibits no tenderness.  Abdominal: Soft. Normal appearance and bowel sounds are normal. She exhibits no distension. There is no tenderness.  Musculoskeletal:       Bruising right upper arm. Tenderness right shoulder, upper arm and elbow. Radial pulse 2+. Pt with mild tenderness right hip. Limited rom right shoulder and elbow due to pain. Good rom other extremities without pain or focal bony tenderness. CTLS spine, non tender, aligned, no step off.   Neurological: She is alert.       Pt alert, oriented. Ms at baseline per family. Motor intact bil.   Skin: Skin is warm and dry. No rash noted.    ED Course  Procedures (including critical care time)    Dg Shoulder Right  12/29/2011  *RADIOLOGY REPORT*  Clinical Data: Fall.  Pain  RIGHT SHOULDER - 2+ VIEW  Comparison: None  Findings: There is an acute fracture deformity involving the humeral head. Mild lateral displacement of the greater tuberosity.  No dislocation identified  IMPRESSION:  1. Acute comminuted fracture involves the humeral head.  More detailed assessment of the fracture may be obtained with CT of the shoulder.   Original Report Authenticated By: Rosealee Albee, M.D.   Dg Elbow Complete Right  12/29/2011  *RADIOLOGY REPORT*  Clinical Data: Fall.  Pain  RIGHT ELBOW - COMPLETE 3+ VIEW  Comparison: None  Findings: There is no evidence of fracture or dislocation.  There is no evidence of arthropathy or  other focal bone abnormality. Soft tissues are unremarkable.  IMPRESSION: Negative exam.  Original Report Authenticated By: Rosealee Albee, M.D.   Dg Hip Complete Right  12/29/2011  *RADIOLOGY REPORT*  Clinical Data: Fall  RIGHT HIP - COMPLETE 2+ VIEW  Comparison: 12/11/2009  Findings: There is chronic deformity of the right pubic body.  No acute fracture and no dislocation. Osteopenia.  Postoperative changes in the lumbar spine.  IMPRESSION: No acute bony pathology.  Original Report Authenticated By: Donavan Burnet, M.D.    Dg Humerus Right  12/29/2011  *RADIOLOGY REPORT*  Clinical Data: Fall.  Pain  RIGHT HUMERUS - 2+ VIEW  Comparison: None  Findings: There is an acute fracture deformity involving the greater tuberosity of the proximal humerus.  The mid and distal portions of the femurs appear intact.  No dislocations.  IMPRESSION:  1.  Proximal humerus fracture.  Original Report Authenticated By: Rosealee Albee, M.D.      MDM  Xrays.   Reviewed nursing notes and prior charts for additional history.   Discussed xr w pt.  vicodin po. zofran po.   Shoulder immobilizer.         Suzi Roots, MD 12/29/11 [phone]

## 2011-12-29 NOTE — ED Notes (Signed)
[id number]<AV> Expected date:12/29/11<BR> Expected time: 1:06 PM<BR> Means of arrival:Ambulance<BR> Comments:<BR> 62yoF, R arm pain and R hip pain.

## 2011-12-29 NOTE — ED Notes (Signed)
Patient transported to X-ray 

## 2012-01-06 DIAGNOSIS — L89509 Pressure ulcer of unspecified ankle, unspecified stage: Secondary | ICD-10-CM | POA: Diagnosis not present

## 2012-01-12 DIAGNOSIS — S42209A Unspecified fracture of upper end of unspecified humerus, initial encounter for closed fracture: Secondary | ICD-10-CM | POA: Diagnosis not present

## 2012-01-28 DIAGNOSIS — I2510 Atherosclerotic heart disease of native coronary artery without angina pectoris: Secondary | ICD-10-CM | POA: Diagnosis not present

## 2012-01-28 DIAGNOSIS — E785 Hyperlipidemia, unspecified: Secondary | ICD-10-CM | POA: Diagnosis not present

## 2012-01-28 DIAGNOSIS — F329 Major depressive disorder, single episode, unspecified: Secondary | ICD-10-CM | POA: Diagnosis not present

## 2012-01-28 DIAGNOSIS — I10 Essential (primary) hypertension: Secondary | ICD-10-CM | POA: Diagnosis not present

## 2012-01-28 DIAGNOSIS — M069 Rheumatoid arthritis, unspecified: Secondary | ICD-10-CM | POA: Diagnosis not present

## 2012-01-28 DIAGNOSIS — I6990 Unspecified sequelae of unspecified cerebrovascular disease: Secondary | ICD-10-CM | POA: Diagnosis not present

## 2012-02-24 DIAGNOSIS — Z7901 Long term (current) use of anticoagulants: Secondary | ICD-10-CM | POA: Diagnosis not present

## 2012-02-24 DIAGNOSIS — I80299 Phlebitis and thrombophlebitis of other deep vessels of unspecified lower extremity: Secondary | ICD-10-CM | POA: Diagnosis not present

## 2012-02-27 ENCOUNTER — Emergency Department (HOSPITAL_COMMUNITY)
Admission: EM | Admit: 2012-02-27 | Discharge: 2012-02-27 | Disposition: A | Discharge status: Skilled Nursing Facility | Payer: Medicare Other | Attending: Emergency Medicine | Admitting: Emergency Medicine

## 2012-02-27 ENCOUNTER — Encounter (HOSPITAL_COMMUNITY): Payer: Self-pay | Admitting: Emergency Medicine

## 2012-02-27 ENCOUNTER — Emergency Department (HOSPITAL_COMMUNITY): Payer: Medicare Other

## 2012-02-27 DIAGNOSIS — I252 Old myocardial infarction: Secondary | ICD-10-CM | POA: Insufficient documentation

## 2012-02-27 DIAGNOSIS — Z79899 Other long term (current) drug therapy: Secondary | ICD-10-CM | POA: Insufficient documentation

## 2012-02-27 DIAGNOSIS — I2510 Atherosclerotic heart disease of native coronary artery without angina pectoris: Secondary | ICD-10-CM | POA: Insufficient documentation

## 2012-02-27 DIAGNOSIS — N390 Urinary tract infection, site not specified: Secondary | ICD-10-CM | POA: Diagnosis not present

## 2012-02-27 DIAGNOSIS — K219 Gastro-esophageal reflux disease without esophagitis: Secondary | ICD-10-CM | POA: Insufficient documentation

## 2012-02-27 DIAGNOSIS — E039 Hypothyroidism, unspecified: Secondary | ICD-10-CM | POA: Insufficient documentation

## 2012-02-27 DIAGNOSIS — M810 Age-related osteoporosis without current pathological fracture: Secondary | ICD-10-CM | POA: Insufficient documentation

## 2012-02-27 DIAGNOSIS — R0602 Shortness of breath: Secondary | ICD-10-CM | POA: Insufficient documentation

## 2012-02-27 DIAGNOSIS — J9819 Other pulmonary collapse: Secondary | ICD-10-CM | POA: Diagnosis not present

## 2012-02-27 DIAGNOSIS — F341 Dysthymic disorder: Secondary | ICD-10-CM | POA: Insufficient documentation

## 2012-02-27 DIAGNOSIS — M069 Rheumatoid arthritis, unspecified: Secondary | ICD-10-CM | POA: Insufficient documentation

## 2012-02-27 DIAGNOSIS — Z8673 Personal history of transient ischemic attack (TIA), and cerebral infarction without residual deficits: Secondary | ICD-10-CM | POA: Insufficient documentation

## 2012-02-27 LAB — URINALYSIS, ROUTINE W REFLEX MICROSCOPIC
Glucose, UA: NEGATIVE mg/dL
Nitrite: NEGATIVE
Protein, ur: NEGATIVE mg/dL

## 2012-02-27 LAB — COMPREHENSIVE METABOLIC PANEL
AST: 17 U/L (ref 0–37)
Albumin: 3.2 g/dL — ABNORMAL LOW (ref 3.5–5.2)
Alkaline Phosphatase: 74 U/L (ref 39–117)
Chloride: 102 mEq/L (ref 96–112)
Creatinine, Ser: 1.09 mg/dL (ref 0.50–1.10)
Potassium: 4 mEq/L (ref 3.5–5.1)
Total Bilirubin: 0.2 mg/dL — ABNORMAL LOW (ref 0.3–1.2)
Total Protein: 6.2 g/dL (ref 6.0–8.3)

## 2012-02-27 LAB — CBC WITH DIFFERENTIAL/PLATELET
Basophils Absolute: 0 10*3/uL (ref 0.0–0.1)
Basophils Relative: 0 % (ref 0–1)
MCHC: 31.8 g/dL (ref 30.0–36.0)
Neutro Abs: 7.3 10*3/uL (ref 1.7–7.7)
Neutrophils Relative %: 77 % (ref 43–77)
RDW: 15.3 % (ref 11.5–15.5)

## 2012-02-27 LAB — URINE MICROSCOPIC-ADD ON

## 2012-02-27 LAB — PROTIME-INR: INR: 2.9 — ABNORMAL HIGH (ref 0.00–1.49)

## 2012-02-27 LAB — LACTIC ACID, PLASMA: Lactic Acid, Venous: 1.4 mmol/L (ref 0.5–2.2)

## 2012-02-27 MED ORDER — CEPHALEXIN 500 MG PO CAPS
500.0000 mg | ORAL_CAPSULE | Freq: Once | ORAL | Status: AC
  Administered 2012-02-27: 500 mg via ORAL
  Filled 2012-02-27: qty 1

## 2012-02-27 MED ORDER — CEPHALEXIN 500 MG PO CAPS: 500.0000 mg | ORAL_CAPSULE | Freq: Two times a day (BID) | ORAL | Status: AC

## 2012-02-27 NOTE — ED Provider Notes (Addendum)
History     CSN: [id number]  Arrival date & time 02/27/12  1506   First MD Initiated Contact with Patient 02/27/12 1552      Chief Complaint  Patient presents with  . Abnormal Lab    Hgb    (Consider location/radiation/quality/duration/timing/severity/associated sxs/prior treatment) HPI The patient presents with concerns of abnormal lab values.  Notably, the patient had multiple medical issues over the past few months, including stroke, pneumonia, DVT.  She is a resident of a rehabilitation facility.  She notes that one week ago she had an episode of black stool after her medications were changed to include blood.  Aspirin, Coumadin, Lovenox.  She denies any ongoing melena, black stool, abdominal pain, nausea, vomiting, diarrhea. Per report the patient had a hemoglobin value of 9.7 today. Past Medical History  Diagnosis Date  . SOB (shortness of breath)   . MI (myocardial infarction)   . Poor appetite   . Arthritis   . Fatigue   . Right ear pain   . Renal insufficiency   . RA (rheumatoid arthritis)   . PUD (peptic ulcer disease)   . GI bleed 2005  . GERD (gastroesophageal reflux disease)   . Hiatal hernia   . Hypothyroidism   . Osteoporosis   . Depression   . Anxiety   . Coronary artery disease   . CVA (cerebral vascular accident)   . TIA (transient ischemic attack)   . Fall     Past Surgical History  Procedure Date  . Cardiac catheterization     SHOWED RUPTURE PLAQUE IN THE LAD. THE LAD IS NONOBSTRUCTIVE WITH ONLY 30-40% STENOSIS  . Nstemi 06/2010  . Spinal fusion surgery   . Knee surgery     Family History  Problem Relation Age of Onset  . Kidney disease Mother   . Kidney disease Brother     History  Substance Use Topics  . Smoking status: Never Smoker   . Smokeless tobacco: Never Used  . Alcohol Use: No    OB History    Grav Para Term Preterm Abortions TAB SAB Ect Mult Living                  Review of Systems  Constitutional:       HPI    HENT:       HPI otherwise negative  Eyes: Negative.   Respiratory:       HPI, otherwise negative  Cardiovascular:       HPI, otherwise nmegative  Gastrointestinal: Negative for vomiting.  Genitourinary:       HPI, otherwise negative  Musculoskeletal:       HPI, otherwise negative  Skin: Negative.   Neurological: Negative for syncope.    Allergies  Codeine; Morphine and related; Percocet; Plavix; and Sulfur  Home Medications   Current Outpatient Rx  Name Route Sig Dispense Refill  . ACETAMINOPHEN 500 MG PO TABS Oral Take 500 mg by mouth every 6 (six) hours as needed. For pain    . VITAMIN C PO Oral Take 500 mg by mouth daily.     . CHOLECALCIFEROL 10000 UNITS PO CAPS Oral Take 5,000 Units by mouth daily.     Marland Kitchen DIAZEPAM 5 MG PO TABS Oral Take 5 mg by mouth at bedtime.    Marland Kitchen ENOXAPARIN SODIUM 30 MG/0.3ML  SOLN Subcutaneous Inject 30 mg into the skin every 12 (twelve) hours.    Marland Kitchen ESOMEPRAZOLE MAGNESIUM 40 MG PO CPDR Oral Take 40 mg by  mouth daily before breakfast.      . FOLIC ACID 1 MG PO TABS Oral Take 1 mg by mouth daily.      Marland Kitchen HYDROCODONE-ACETAMINOPHEN 5-325 MG PO TABS Oral Take 1 tablet by mouth every 6 (six) hours as needed. Pain    . LEVOTHYROXINE SODIUM 50 MCG PO TABS Oral Take 50 mcg by mouth daily.     Marland Kitchen LISINOPRIL-HYDROCHLOROTHIAZIDE 20-12.5 MG PO TABS Oral Take 1 tablet by mouth daily.      Marland Kitchen METOPROLOL TARTRATE 50 MG PO TABS Oral Take 50 mg by mouth 2 (two) times daily.      Marland Kitchen ONE-DAILY MULTI VITAMINS PO TABS Oral Take 1 tablet by mouth daily.      Marland Kitchen FISH OIL 1200 MG PO CAPS Oral Take 1,200 mg by mouth daily.    Marland Kitchen PREDNISONE 10 MG PO TABS Oral Take 10 mg by mouth daily.    Marland Kitchen ROSUVASTATIN CALCIUM 20 MG PO TABS Oral Take 20 mg by mouth daily.     . SERTRALINE HCL 50 MG PO TABS Oral Take 50 mg by mouth daily.      . WARFARIN SODIUM 5 MG PO TABS Oral Take 5 mg by mouth daily.      BP 136/61  Pulse 81  Temp 98.3 F (36.8 C) (Oral)  Resp 18  SpO2 98%  Physical  Exam  Nursing note and vitals reviewed. Constitutional: She is oriented to person, place, and time. She appears well-developed and well-nourished. No distress.  HENT:  Head: Normocephalic and atraumatic.  Eyes: Conjunctivae and EOM are normal.  Cardiovascular: Normal rate and regular rhythm.   Pulmonary/Chest: Effort normal and breath sounds normal. No stridor. No respiratory distress.  Abdominal: She exhibits no distension.  Musculoskeletal: She exhibits no edema.  Neurological: She is alert and oriented to person, place, and time. No cranial nerve deficit. She exhibits abnormal muscle tone. Coordination normal.       Patient can move all to be spontaneously, has minimally deficient strength in the right side.  No facial asymmetry, no dysphasia.  Skin: Skin is warm and dry.  Psychiatric: She has a normal mood and affect.    ED Course  Procedures (including critical care time)   Labs Reviewed  CBC WITH DIFFERENTIAL  COMPREHENSIVE METABOLIC PANEL  PROTIME-INR  TROPONIN I  LACTIC ACID, PLASMA  URINALYSIS, ROUTINE W REFLEX MICROSCOPIC   Dg Chest 2 View  02/27/2012  *RADIOLOGY REPORT*  Clinical Data: Cough.  History of DVT.  Anemia.  CHEST - 2 VIEW  Comparison: Portable chest x-ray 12/07/2011.  Two-view chest x-ray 12/01/2011, 12/03/2007.  Findings: Suboptimal inspiration due to body habitus which accounts for crowded bronchovascular markings at the bases and accentuates the cardiac silhouette.  Taking this into account, cardiac silhouette mildly enlarged but stable.  Thoracic aorta atherosclerotic and ectatic, unchanged from the examination 1 month ago but increased in caliber since 2009.  Large hiatal hernia, also increased since 2009.  Linear atelectasis in the inferior right upper lobe.  Lungs otherwise clear.  No pleural effusion. Thoracolumbar Harrington rod fusion with generalized osteopenia and old compression fractures of 2 adjacent lower thoracic vertebrae.  IMPRESSION: Suboptimal  inspiration.  Stable mild cardiomegaly without pulmonary edema.  Minimal linear atelectasis in the right upper lobe.  No acute cardiopulmonary disease otherwise.   Original Report Authenticated By: Arnell Sieving, M.D.      No diagnosis found.  Pulse ox 99%ra, normal   Date: 04/08/2012  Rate: 82  Rhythm: normal sinus rhythm  QRS Axis: left  Intervals: normal  ST/T Wave abnormalities: nonspecific T wave changes  Conduction Disutrbances:none  Narrative Interpretation:   Old EKG Reviewed: unchanged ABNORMAL ECG  MDM  This pleasant elderly female in no distress presents with concerns of abnormal labs.  Patient has multiple medical problems, denies any notable new weakness, fatigue, chest pain or dyspnea.  On my exam the patient speaks clearly, has no new focal neurologic deficiencies, and her denial of any ongoing melena or black stool is reassuring.  The patient's hemoglobin has increased from baseline, and her labs are notable mostly for a urinary tract infection.  She was discharged in stable condition.  Gerhard Munch, MD 02/27/12 8119  Gerhard Munch, MD 04/08/12 [phone]

## 2012-02-27 NOTE — ED Notes (Signed)
Pt attempted to urinate but wasn't able to obtain a sample at this time

## 2012-02-27 NOTE — ED Notes (Signed)
Per EMS, pt from [facility] reports low Hgb.  Per report with pt, pt's Hgb is 9.7.  Pt reports R leg pain, being treated for DVT at present.

## 2012-02-27 NOTE — ED Notes (Signed)
[facility] called for transport.  

## 2012-02-27 NOTE — ED Notes (Signed)
To ED via [facility] Medic [facility]-- with c/o abnormal labs  From [facility]. Had recent DVT and stroke and pneumonia -- slight Right sided weakness, Alert/ Oriented x 4

## 2012-02-27 NOTE — Discharge Instructions (Signed)
Urinary Tract Infection Infections of the urinary tract can start in several places. A bladder infection (cystitis), a kidney infection (pyelonephritis), and a prostate infection (prostatitis) are different types of urinary tract infections (UTIs). They usually get better if treated with medicines (antibiotics) that kill germs. Take all the medicine until it is gone. You or your child may feel better in a few days, but TAKE ALL MEDICINE or the infection may not respond and may become more difficult to treat. HOME CARE INSTRUCTIONS   Drink enough water and fluids to keep the urine clear or pale yellow. Cranberry juice is especially recommended, in addition to large amounts of water.   Avoid caffeine, tea, and carbonated beverages. They tend to irritate the bladder.   Alcohol may irritate the prostate.   Only take over-the-counter or prescription medicines for pain, discomfort, or fever as directed by your caregiver.  To prevent further infections:  Empty the bladder often. Avoid holding urine for long periods of time.   After a bowel movement, women should cleanse from front to back. Use each tissue only once.   Empty the bladder before and after sexual intercourse.  FINDING OUT THE RESULTS OF YOUR TEST Not all test results are available during your visit. If your or your child's test results are not back during the visit, make an appointment with your caregiver to find out the results. Do not assume everything is normal if you have not heard from your caregiver or the medical facility. It is important for you to follow up on all test results. SEEK MEDICAL CARE IF:   There is back pain.   Your baby is older than 3 months with a rectal temperature of 100.5 F (38.1 C) or higher for more than 1 day.   Your or your child's problems (symptoms) are no better in 3 days. Return sooner if you or your child is getting worse.  SEEK IMMEDIATE MEDICAL CARE IF:   There is severe back pain or lower  abdominal pain.   You or your child develops chills.   You have a fever.   Your baby is older than 3 months with a rectal temperature of 102 F (38.9 C) or higher.   Your baby is 54 months old or younger with a rectal temperature of 100.4 F (38 C) or higher.   There is nausea or vomiting.   There is continued burning or discomfort with urination.  MAKE SURE YOU:   Understand these instructions.   Will watch your condition.   Will get help right away if you are not doing well or get worse.  Document Released: 03/26/2005 Document Revised: 06/05/2011 Document Reviewed: 10/29/2006 [facility] Patient Information 2012 [place], [place].Urine Culture Collection, Female  You will collect a sample of pee (urine) in a cup. Read the instructions below before beginning. If you have any questions, ask the nurse before you begin. Follow the instructions carefully. 1. Wash your hands with soap and water and dry them thoroughly.  2. Open the lid of the cup. Be careful not to touch the inside.  3. Clean the private (genital) area. 1. Sit over the toilet. Use the fingers of one hand to separate and hold open the folds of the skin in your private area.  2. Clean the pee (urinary) opening and surrounding area with the gauze, wiping from front to back. Throw away the gauze in the trash, not the toilet.  3. Repeat step "b"2 more times.  4. With the folds of  skin still separated, pee a small amount into toilet. STOP, then pee into the cup. Fill the cup half way.  5. Put the lid on the cup tightly.  6. Wash your hands with soap and water.  7. If you were given a label, put the label on the cup.  8. Give the cup to the nurse.  Document Released: 05/29/2008 Document Revised: 06/05/2011 Document Reviewed: 05/29/2008 [facility] Patient Information 2012 [place], [place].

## 2012-02-27 NOTE — ED Notes (Addendum)
[name] called to inform of pt's return but no answer.

## 2012-03-05 DIAGNOSIS — I679 Cerebrovascular disease, unspecified: Secondary | ICD-10-CM | POA: Diagnosis not present

## 2012-03-10 DIAGNOSIS — F411 Generalized anxiety disorder: Secondary | ICD-10-CM | POA: Diagnosis not present

## 2012-03-10 DIAGNOSIS — F329 Major depressive disorder, single episode, unspecified: Secondary | ICD-10-CM | POA: Diagnosis not present

## 2012-03-11 DIAGNOSIS — M25579 Pain in unspecified ankle and joints of unspecified foot: Secondary | ICD-10-CM | POA: Diagnosis not present

## 2012-03-16 ENCOUNTER — Encounter (HOSPITAL_COMMUNITY): Payer: Self-pay | Admitting: *Deleted

## 2012-03-16 ENCOUNTER — Emergency Department (HOSPITAL_COMMUNITY): Payer: Medicare Other

## 2012-03-16 ENCOUNTER — Inpatient Hospital Stay (HOSPITAL_COMMUNITY)
Admission: EM | Admit: 2012-03-16 | Discharge: 2012-03-19 | DRG: 871 | Disposition: A | Discharge status: Skilled Nursing Facility | Payer: Medicare Other | Attending: Internal Medicine | Admitting: Internal Medicine

## 2012-03-16 DIAGNOSIS — R630 Anorexia: Secondary | ICD-10-CM

## 2012-03-16 DIAGNOSIS — K5289 Other specified noninfective gastroenteritis and colitis: Secondary | ICD-10-CM | POA: Diagnosis present

## 2012-03-16 DIAGNOSIS — K449 Diaphragmatic hernia without obstruction or gangrene: Secondary | ICD-10-CM

## 2012-03-16 DIAGNOSIS — R6520 Severe sepsis without septic shock: Secondary | ICD-10-CM | POA: Diagnosis present

## 2012-03-16 DIAGNOSIS — E860 Dehydration: Secondary | ICD-10-CM | POA: Diagnosis not present

## 2012-03-16 DIAGNOSIS — Z7901 Long term (current) use of anticoagulants: Secondary | ICD-10-CM

## 2012-03-16 DIAGNOSIS — R1310 Dysphagia, unspecified: Secondary | ICD-10-CM

## 2012-03-16 DIAGNOSIS — M069 Rheumatoid arthritis, unspecified: Secondary | ICD-10-CM | POA: Diagnosis present

## 2012-03-16 DIAGNOSIS — R5383 Other fatigue: Secondary | ICD-10-CM

## 2012-03-16 DIAGNOSIS — Z981 Arthrodesis status: Secondary | ICD-10-CM | POA: Diagnosis not present

## 2012-03-16 DIAGNOSIS — H9201 Otalgia, right ear: Secondary | ICD-10-CM

## 2012-03-16 DIAGNOSIS — N289 Disorder of kidney and ureter, unspecified: Secondary | ICD-10-CM

## 2012-03-16 DIAGNOSIS — A419 Sepsis, unspecified organism: Secondary | ICD-10-CM | POA: Diagnosis not present

## 2012-03-16 DIAGNOSIS — Z8673 Personal history of transient ischemic attack (TIA), and cerebral infarction without residual deficits: Secondary | ICD-10-CM

## 2012-03-16 DIAGNOSIS — K922 Gastrointestinal hemorrhage, unspecified: Secondary | ICD-10-CM

## 2012-03-16 DIAGNOSIS — Z4682 Encounter for fitting and adjustment of non-vascular catheter: Secondary | ICD-10-CM | POA: Diagnosis not present

## 2012-03-16 DIAGNOSIS — J189 Pneumonia, unspecified organism: Secondary | ICD-10-CM

## 2012-03-16 DIAGNOSIS — F411 Generalized anxiety disorder: Secondary | ICD-10-CM | POA: Diagnosis present

## 2012-03-16 DIAGNOSIS — M542 Cervicalgia: Secondary | ICD-10-CM | POA: Diagnosis not present

## 2012-03-16 DIAGNOSIS — M1990 Unspecified osteoarthritis, unspecified site: Secondary | ICD-10-CM

## 2012-03-16 DIAGNOSIS — J9819 Other pulmonary collapse: Secondary | ICD-10-CM | POA: Diagnosis not present

## 2012-03-16 DIAGNOSIS — E039 Hypothyroidism, unspecified: Secondary | ICD-10-CM | POA: Diagnosis present

## 2012-03-16 DIAGNOSIS — R2681 Unsteadiness on feet: Secondary | ICD-10-CM

## 2012-03-16 DIAGNOSIS — E2749 Other adrenocortical insufficiency: Secondary | ICD-10-CM | POA: Diagnosis present

## 2012-03-16 DIAGNOSIS — R509 Fever, unspecified: Secondary | ICD-10-CM | POA: Diagnosis not present

## 2012-03-16 DIAGNOSIS — I639 Cerebral infarction, unspecified: Secondary | ICD-10-CM

## 2012-03-16 DIAGNOSIS — M810 Age-related osteoporosis without current pathological fracture: Secondary | ICD-10-CM | POA: Diagnosis present

## 2012-03-16 DIAGNOSIS — F329 Major depressive disorder, single episode, unspecified: Secondary | ICD-10-CM | POA: Diagnosis present

## 2012-03-16 DIAGNOSIS — F419 Anxiety disorder, unspecified: Secondary | ICD-10-CM

## 2012-03-16 DIAGNOSIS — G459 Transient cerebral ischemic attack, unspecified: Secondary | ICD-10-CM | POA: Diagnosis not present

## 2012-03-16 DIAGNOSIS — R0602 Shortness of breath: Secondary | ICD-10-CM

## 2012-03-16 DIAGNOSIS — K279 Peptic ulcer, site unspecified, unspecified as acute or chronic, without hemorrhage or perforation: Secondary | ICD-10-CM

## 2012-03-16 DIAGNOSIS — Z79899 Other long term (current) drug therapy: Secondary | ICD-10-CM | POA: Diagnosis not present

## 2012-03-16 DIAGNOSIS — I219 Acute myocardial infarction, unspecified: Secondary | ICD-10-CM

## 2012-03-16 DIAGNOSIS — K219 Gastro-esophageal reflux disease without esophagitis: Secondary | ICD-10-CM | POA: Diagnosis present

## 2012-03-16 DIAGNOSIS — R579 Shock, unspecified: Secondary | ICD-10-CM

## 2012-03-16 DIAGNOSIS — F3289 Other specified depressive episodes: Secondary | ICD-10-CM | POA: Diagnosis present

## 2012-03-16 DIAGNOSIS — E2740 Unspecified adrenocortical insufficiency: Secondary | ICD-10-CM

## 2012-03-16 DIAGNOSIS — M25519 Pain in unspecified shoulder: Secondary | ICD-10-CM | POA: Diagnosis not present

## 2012-03-16 DIAGNOSIS — N179 Acute kidney failure, unspecified: Secondary | ICD-10-CM | POA: Diagnosis not present

## 2012-03-16 DIAGNOSIS — R000 Tachycardia, unspecified: Secondary | ICD-10-CM | POA: Diagnosis not present

## 2012-03-16 DIAGNOSIS — R042 Hemoptysis: Secondary | ICD-10-CM | POA: Diagnosis not present

## 2012-03-16 DIAGNOSIS — R6521 Severe sepsis with septic shock: Secondary | ICD-10-CM | POA: Diagnosis present

## 2012-03-16 DIAGNOSIS — R6889 Other general symptoms and signs: Secondary | ICD-10-CM | POA: Diagnosis not present

## 2012-03-16 DIAGNOSIS — I6350 Cerebral infarction due to unspecified occlusion or stenosis of unspecified cerebral artery: Secondary | ICD-10-CM | POA: Diagnosis not present

## 2012-03-16 DIAGNOSIS — R0902 Hypoxemia: Secondary | ICD-10-CM | POA: Diagnosis not present

## 2012-03-16 DIAGNOSIS — R079 Chest pain, unspecified: Secondary | ICD-10-CM | POA: Diagnosis not present

## 2012-03-16 DIAGNOSIS — E440 Moderate protein-calorie malnutrition: Secondary | ICD-10-CM

## 2012-03-16 DIAGNOSIS — F32A Depression, unspecified: Secondary | ICD-10-CM

## 2012-03-16 DIAGNOSIS — R918 Other nonspecific abnormal finding of lung field: Secondary | ICD-10-CM | POA: Diagnosis not present

## 2012-03-16 HISTORY — DX: Acute embolism and thrombosis of unspecified deep veins of unspecified lower extremity: I82.409

## 2012-03-16 LAB — URINALYSIS, ROUTINE W REFLEX MICROSCOPIC
Bilirubin Urine: NEGATIVE
Glucose, UA: NEGATIVE mg/dL
Hgb urine dipstick: NEGATIVE
Ketones, ur: NEGATIVE mg/dL
Nitrite: NEGATIVE
Protein, ur: NEGATIVE mg/dL
Specific Gravity, Urine: 1.023 (ref 1.005–1.030)
Urobilinogen, UA: 0.2 mg/dL (ref 0.0–1.0)
pH: 5.5 (ref 5.0–8.0)

## 2012-03-16 LAB — CBC WITH DIFFERENTIAL/PLATELET
Basophils Absolute: 0 10*3/uL (ref 0.0–0.1)
Basophils Absolute: 0 10*3/uL (ref 0.0–0.1)
Basophils Relative: 0 % (ref 0–1)
Eosinophils Absolute: 0.2 10*3/uL (ref 0.0–0.7)
Eosinophils Relative: 1 % (ref 0–5)
Eosinophils Relative: 0 % (ref 0–5)
HCT: 32.9 % — ABNORMAL LOW (ref 36.0–46.0)
Hemoglobin: 10.4 g/dL — ABNORMAL LOW (ref 12.0–15.0)
Lymphocytes Relative: 2 % — ABNORMAL LOW (ref 12–46)
Lymphocytes Relative: 3 % — ABNORMAL LOW (ref 12–46)
Lymphs Abs: 0.5 10*3/uL — ABNORMAL LOW (ref 0.7–4.0)
MCH: 27.8 pg (ref 26.0–34.0)
MCHC: 31.6 g/dL (ref 30.0–36.0)
MCV: 88 fL (ref 78.0–100.0)
MCV: 88.7 fL (ref 78.0–100.0)
Monocytes Absolute: 1.4 10*3/uL — ABNORMAL HIGH (ref 0.1–1.0)
Monocytes Relative: 6 % (ref 3–12)
Neutro Abs: 20.7 10*3/uL — ABNORMAL HIGH (ref 1.7–7.7)
Neutrophils Relative %: 91 % — ABNORMAL HIGH (ref 43–77)
Neutrophils Relative %: 91 % — ABNORMAL HIGH (ref 43–77)
Platelets: 226 10*3/uL (ref 150–400)
Platelets: 186 10*3/uL (ref 150–400)
RBC: 3.74 MIL/uL — ABNORMAL LOW (ref 3.87–5.11)
RDW: 15.6 % — ABNORMAL HIGH (ref 11.5–15.5)
RDW: 15.7 % — ABNORMAL HIGH (ref 11.5–15.5)
WBC: 22.8 10*3/uL — ABNORMAL HIGH (ref 4.0–10.5)
WBC: 18.9 10*3/uL — ABNORMAL HIGH (ref 4.0–10.5)

## 2012-03-16 LAB — BASIC METABOLIC PANEL
BUN: 44 mg/dL — ABNORMAL HIGH (ref 6–23)
CO2: 28 mEq/L (ref 19–32)
Calcium: 9.9 mg/dL (ref 8.4–10.5)
Chloride: 97 mEq/L (ref 96–112)
Creatinine, Ser: 1.51 mg/dL — ABNORMAL HIGH (ref 0.50–1.10)
GFR calc Af Amer: 36 mL/min — ABNORMAL LOW (ref >90)
GFR calc non Af Amer: 31 mL/min — ABNORMAL LOW (ref >90)
Glucose, Bld: 153 mg/dL — ABNORMAL HIGH (ref 70–99)
Potassium: 4.6 mEq/L (ref 3.5–5.1)
Sodium: 137 mEq/L (ref 135–145)

## 2012-03-16 LAB — URINE MICROSCOPIC-ADD ON

## 2012-03-16 LAB — LACTIC ACID, PLASMA: Lactic Acid, Venous: 1.9 mmol/L (ref 0.5–2.2)

## 2012-03-16 LAB — PROTIME-INR: Prothrombin Time: 28 seconds — ABNORMAL HIGH (ref 11.6–15.2)

## 2012-03-16 LAB — PROCALCITONIN: Procalcitonin: 2.26 ng/mL

## 2012-03-16 MED ORDER — ACETAMINOPHEN 325 MG PO TABS
325.0000 mg | ORAL_TABLET | Freq: Once | ORAL | Status: AC
  Administered 2012-03-16: 325 mg via ORAL
  Filled 2012-03-16: qty 1

## 2012-03-16 MED ORDER — DEXTROSE 5 % IV SOLN
1.0000 g | INTRAVENOUS | Status: DC
  Administered 2012-03-16 – 2012-03-17 (×2): 1 g via INTRAVENOUS
  Filled 2012-03-16 (×3): qty 1

## 2012-03-16 MED ORDER — ACETAMINOPHEN 325 MG PO TABS
650.0000 mg | ORAL_TABLET | Freq: Once | ORAL | Status: AC
  Administered 2012-03-16: 650 mg via ORAL
  Filled 2012-03-16: qty 2

## 2012-03-16 MED ORDER — METRONIDAZOLE IN NACL 5-0.79 MG/ML-% IV SOLN
500.0000 mg | Freq: Three times a day (TID) | INTRAVENOUS | Status: DC
  Administered 2012-03-16 – 2012-03-18 (×5): 500 mg via INTRAVENOUS
  Filled 2012-03-16 (×7): qty 100

## 2012-03-16 MED ORDER — FAMOTIDINE IN NACL 20-0.9 MG/50ML-% IV SOLN
20.0000 mg | INTRAVENOUS | Status: DC
  Administered 2012-03-16: 20 mg via INTRAVENOUS
  Filled 2012-03-16: qty 50

## 2012-03-16 MED ORDER — DEXTROSE 5 % IV SOLN
1.0000 g | Freq: Three times a day (TID) | INTRAVENOUS | Status: DC
  Filled 2012-03-16: qty 1

## 2012-03-16 MED ORDER — LEVOFLOXACIN IN D5W 750 MG/150ML IV SOLN
750.0000 mg | INTRAVENOUS | Status: DC
  Filled 2012-03-16: qty 150

## 2012-03-16 MED ORDER — SODIUM CHLORIDE 0.9 % IV BOLUS (SEPSIS)
1500.0000 mL | Freq: Once | INTRAVENOUS | Status: AC
  Administered 2012-03-16: 1500 mL via INTRAVENOUS

## 2012-03-16 MED ORDER — SODIUM CHLORIDE 0.9 % IV BOLUS (SEPSIS)
500.0000 mL | Freq: Once | INTRAVENOUS | Status: AC
  Administered 2012-03-16: 500 mL via INTRAVENOUS

## 2012-03-16 MED ORDER — SODIUM CHLORIDE 0.9 % IV BOLUS (SEPSIS)
1000.0000 mL | Freq: Once | INTRAVENOUS | Status: AC
  Administered 2012-03-16: 1000 mL via INTRAVENOUS

## 2012-03-16 MED ORDER — HYDROCORTISONE SOD SUCCINATE 100 MG IJ SOLR
100.0000 mg | Freq: Four times a day (QID) | INTRAMUSCULAR | Status: DC
  Administered 2012-03-16 – 2012-03-17 (×3): 100 mg via INTRAVENOUS
  Filled 2012-03-16 (×7): qty 2

## 2012-03-16 MED ORDER — VANCOMYCIN HCL IN DEXTROSE 1-5 GM/200ML-% IV SOLN: 1000.0000 mg | INTRAVENOUS | Status: DC

## 2012-03-16 MED ORDER — VANCOMYCIN HCL 1000 MG IV SOLR
1250.0000 mg | INTRAVENOUS | Status: AC
  Administered 2012-03-16: 1250 mg via INTRAVENOUS
  Filled 2012-03-16: qty 1250

## 2012-03-16 MED ORDER — SODIUM CHLORIDE 0.9 % IV SOLN
INTRAVENOUS | Status: DC
  Administered 2012-03-17: 05:00:00 via INTRAVENOUS

## 2012-03-16 NOTE — ED Provider Notes (Addendum)
History     CSN: [id number]  Arrival date & time 03/16/12  1231   First MD Initiated Contact with Patient 03/16/12 1313      Chief Complaint  Patient presents with  . Weakness    (Consider location/radiation/quality/duration/timing/severity/associated sxs/prior treatment) HPI Comments: Pt resides in a nursing facility, has h/o prior stroke, has had N/V and some loose stools last night, treated with zofran which has improved nausea, decreased appetite, mild cough that is non productive, no sig HA or stiff neck or rash.  She feels weak all over.  Denies CP, SOB.  Reports small stroke.  She complains of achy pain in shoulder and upper legs.  No dysuria.  Patient is a 78 y.o. female presenting with weakness. The history is provided by the patient, a relative and medical records.  Weakness The primary symptoms include nausea and vomiting.  Additional symptoms include weakness. Additional symptoms do not include neck stiffness.    Past Medical History  Diagnosis Date  . SOB (shortness of breath)   . MI (myocardial infarction)   . Poor appetite   . Arthritis   . Fatigue   . Right ear pain   . Renal insufficiency   . RA (rheumatoid arthritis)   . PUD (peptic ulcer disease)   . GI bleed 2005  . GERD (gastroesophageal reflux disease)   . Hiatal hernia   . Hypothyroidism   . Osteoporosis   . Depression   . Anxiety   . Coronary artery disease   . CVA (cerebral vascular accident)   . TIA (transient ischemic attack)   . Fall     Past Surgical History  Procedure Date  . Cardiac catheterization     SHOWED RUPTURE PLAQUE IN THE LAD. THE LAD IS NONOBSTRUCTIVE WITH ONLY 30-40% STENOSIS  . Nstemi 06/2010  . Spinal fusion surgery   . Knee surgery     Family History  Problem Relation Age of Onset  . Kidney disease Mother   . Kidney disease Brother     History  Substance Use Topics  . Smoking status: Never Smoker   . Smokeless tobacco: Never Used  . Alcohol Use: No    OB  History    Grav Para Term Preterm Abortions TAB SAB Ect Mult Living                  Review of Systems  Constitutional: Positive for fatigue.  HENT: Negative for neck pain and neck stiffness.   Respiratory: Negative for cough and shortness of breath.   Cardiovascular: Negative for chest pain.  Gastrointestinal: Positive for nausea, vomiting and diarrhea.  Neurological: Positive for weakness.  All other systems reviewed and are negative.    Allergies  Codeine; Morphine and related; Percocet; Plavix; and Sulfur  Home Medications   Current Outpatient Rx  Name Route Sig Dispense Refill  . ACETAMINOPHEN 500 MG PO TABS Oral Take 500 mg by mouth every 6 (six) hours as needed. For pain    . VITAMIN C PO Oral Take 500 mg by mouth daily.     . ASPIRIN 81 MG PO CHEW Oral Chew 81 mg by mouth daily.    Marland Kitchen VITAMIN D-3 5000 UNITS PO TABS Oral Take 2 capsules by mouth daily before supper.    Marland Kitchen DIAZEPAM 5 MG PO TABS Oral Take 5 mg by mouth at bedtime.    Marland Kitchen ESOMEPRAZOLE MAGNESIUM 40 MG PO CPDR Oral Take 40 mg by mouth daily before breakfast.     .  FOLIC ACID 1 MG PO TABS Oral Take 1 mg by mouth daily.     Marland Kitchen HYDROCODONE-ACETAMINOPHEN 5-500 MG PO TABS Oral Take 1 tablet by mouth every 6 (six) hours as needed. Pain    . LEVOTHYROXINE SODIUM 50 MCG PO TABS Oral Take 50 mcg by mouth daily.     Marland Kitchen LISINOPRIL-HYDROCHLOROTHIAZIDE 20-12.5 MG PO TABS Oral Take 1 tablet by mouth daily.     Marland Kitchen METOPROLOL TARTRATE 50 MG PO TABS Oral Take 50 mg by mouth 2 (two) times daily.     Marland Kitchen FISH OIL 1200 MG PO CAPS Oral Take 1,200 mg by mouth daily.    Marland Kitchen PREDNISONE 10 MG PO TABS Oral Take 10 mg by mouth daily.    Marland Kitchen ROSUVASTATIN CALCIUM 20 MG PO TABS Oral Take 20 mg by mouth daily.     . SERTRALINE HCL 50 MG PO TABS Oral Take 50 mg by mouth daily.     Marland Kitchen ONE-DAILY MULTI VITAMINS PO TABS Oral Take 1 tablet by mouth daily.      . WARFARIN SODIUM 5 MG PO TABS Oral Take 5 mg by mouth daily.      BP 112/58  Pulse 92  Temp  102.4 F (39.1 C) (Rectal)  Resp 18  SpO2 97%  Physical Exam  Nursing note and vitals reviewed. Constitutional: She appears well-developed.  HENT:  Head: Normocephalic and atraumatic.  Eyes: Pupils are equal, round, and reactive to light. No scleral icterus.  Neck: Normal range of motion. Neck supple.  Cardiovascular: Normal rate and regular rhythm.   No murmur heard. Pulmonary/Chest: Effort normal. No respiratory distress. She has no wheezes. She has rales.  Abdominal: Soft. Normal appearance. She exhibits no distension. There is tenderness. There is no rebound, no guarding, no CVA tenderness, no tenderness at McBurney's point and negative Murphy's sign.  Neurological: She is alert. She displays no atrophy. No cranial nerve deficit or sensory deficit. She exhibits normal muscle tone. Coordination normal. GCS eye subscore is 4. GCS verbal subscore is 4. GCS motor subscore is 6.  Skin: Skin is warm. No rash noted. She is diaphoretic. No cyanosis. There is pallor. Nails show no clubbing.       Hot to touch  Psychiatric: She has a normal mood and affect.    ED Course  Procedures (including critical care time)   CRITICAL CARE Performed by: Lear Ng.   Total critical care time: 30 min  Critical care time was exclusive of separately billable procedures and treating other patients.  Critical care was necessary to treat or prevent imminent or life-threatening deterioration.  Critical care was time spent personally by me on the following activities: development of treatment plan with patient and/or surrogate as well as nursing, discussions with consultants, evaluation of patient's response to treatment, examination of patient, obtaining history from patient or surrogate, ordering and performing treatments and interventions, ordering and review of laboratory studies, ordering and review of radiographic studies, pulse oximetry and re-evaluation of patient's condition.   Labs  Reviewed  CBC WITH DIFFERENTIAL - Abnormal; Notable for the following:    WBC 22.8 (*)     RBC 3.74 (*)     Hemoglobin 10.4 (*)     HCT 32.9 (*)     RDW 15.6 (*)     Neutrophils Relative 91 (*)     Lymphocytes Relative 2 (*)     Neutro Abs 20.7 (*)     Lymphs Abs 0.5 (*)     Monocytes  Absolute 1.4 (*)     All other components within normal limits  BASIC METABOLIC PANEL - Abnormal; Notable for the following:    Glucose, Bld 153 (*)     BUN 44 (*)     Creatinine, Ser 1.51 (*)     GFR calc non Af Amer 31 (*)     GFR calc Af Amer 36 (*)     All other components within normal limits  URINALYSIS, ROUTINE W REFLEX MICROSCOPIC - Abnormal; Notable for the following:    APPearance CLOUDY (*)     Leukocytes, UA SMALL (*)     All other components within normal limits  URINE MICROSCOPIC-ADD ON - Abnormal; Notable for the following:    Squamous Epithelial / LPF FEW (*)     All other components within normal limits  LACTIC ACID, PLASMA  URINE CULTURE  CULTURE, BLOOD (ROUTINE X 2)  CULTURE, BLOOD (ROUTINE X 2)  PROCALCITONIN   No results found.   No diagnosis found.   97% sat is normal by my interpretation  ECG at time 1306 shows SR at rate 93, normal axis, no ST or T wave abn, borderline Q waves inferior leads, no change from ECG on 02/27/12.  2:55 PM Pt's temp is 102, BP is normal, although suggestive of dehydration esp with elevated BUN and Cr.  Given WBC is 22, SIRS/sepsis criteria is borderline.  Not hypotensive, but likely will need admission.  Due to abd pain, vomiting, will get CT of abd/pelvis as well.     4:24 PM abd CT shows no sig abn's, acutely.  CXR suggests lower infiltrate atelecatsis versus infiltrate.  Given no other clear source, will presume pneumonia.  Pt has been coughing by her report and family notes pt has had pneumonia in the past, will start on abx for health care assocaited pneumonia.     4:27 PM Repeat BP shows drop to 94/39 suggestive of sepsis  worsening.  Will give additional IV bolus of 1 L.  abx as above.  Will consult PCCM as well to see pt in the ED.    MDM  Suspect fever, source my be GI infection.  Will get CXR, UA, cultures, check lactic acid and procalcitonin.        Gavin Pound. Oletta Lamas, MD 03/16/12 1610  Gavin Pound. Oletta Lamas, MD 03/21/12 [phone]

## 2012-03-16 NOTE — ED Notes (Signed)
[id number]<AV> Expected date:<BR> Expected time:12:29 PM<BR> Means of arrival:Ambulance<BR> Comments:<BR> Elderly female; ?uti

## 2012-03-16 NOTE — Progress Notes (Signed)
ANTIBIOTIC CONSULT NOTE - INITIAL  Pharmacy Consult for vancomycin/cefepime/levofloxacin Indication: rule out pneumonia  Allergies  Allergen Reactions  . Codeine     sick  . Morphine And Related   . Percocet (Oxycodone-Acetaminophen)     unknown  . Plavix (Clopidogrel Bisulfate)   . Sulfur     Patient Measurements:   Adjusted Body Weight:   Vital Signs: Temp: 101.2 F (38.4 C) (09/17 1625) Temp src: Core (Comment) (09/17 1625) BP: 94/39 mmHg (09/17 1625) Pulse Rate: 86  (09/17 1615) Intake/Output from previous day:   Intake/Output from this shift:    Labs:  Basename 03/16/12 1320  WBC 22.8*  HGB 10.4*  PLT 226  LABCREA --  CREATININE 1.51*   The CrCl is unknown because both a height and weight (above a minimum accepted value) are required for this calculation. No results found for this basename: VANCOTROUGH:2,VANCOPEAK:2,VANCORANDOM:2,GENTTROUGH:2,GENTPEAK:2,GENTRANDOM:2,TOBRATROUGH:2,TOBRAPEAK:2,TOBRARND:2,AMIKACINPEAK:2,AMIKACINTROU:2,AMIKACIN:2, in the last 72 hours   Microbiology: No results found for this or any previous visit (from the past 720 hour(s)).  Medical History: Past Medical History  Diagnosis Date  . SOB (shortness of breath)   . MI (myocardial infarction)   . Poor appetite   . Arthritis   . Fatigue   . Right ear pain   . Renal insufficiency   . RA (rheumatoid arthritis)   . PUD (peptic ulcer disease)   . GI bleed 2005  . GERD (gastroesophageal reflux disease)   . Hiatal hernia   . Hypothyroidism   . Osteoporosis   . Depression   . Anxiety   . Coronary artery disease   . CVA (cerebral vascular accident)   . TIA (transient ischemic attack)   . Fall     Medications:  Scheduled:    . acetaminophen  650 mg Oral Once  . ceFEPime (MAXIPIME) IV  1 g Intravenous Q8H  . levofloxacin (LEVAQUIN) IV  750 mg Intravenous Q24H  . sodium chloride  1,000 mL Intravenous Once  . sodium chloride  500 mL Intravenous Once   Assessment: 82  YOF admitted with weakness from NH.  Complaints of N/V and loose stools. WBC elevated. Tmax in ED = 102 F, PCT=2.26. CXR reveals atelectasis vs infiltrate.  Orders for cultures and broad spectrum antibiotics. Baseline SCr elevated.   9/17 >> vanco >> 9/17 >> cefepime >> 9/17 >> levofloxacin >>  9/17: blood culture: pending 9/17: Urine culture: pending (UA not overwhelming for UTI)  Goal of Therapy:  Vancomycin trough level 15-20 mcg/ml  Plan:  1. Vancomycin 1250mg  IV x 1, then 1gm IV q24h 2. Change Cefepime to 1gm IV q24h 3. Change levofloxacin to 750mg  IV q24h 4. Follow renal function and check trough as needed, Check Css if remains on vancomycin for > 3 days.  5. Follow-up culture data  Dannielle Huh 03/16/2012,4:27 PM

## 2012-03-16 NOTE — Consult Note (Signed)
Name: [name] MRN: [id number] DOB: 1929-11-01    LOS: 0  Referring Provider:  [facility] ER/ [place] Reason for Referral:  Shock  PULMONARY / CRITICAL CARE MEDICINE  HPI:  42 yowf with steroid dep RA sp L hem cva 11/2011 > NHP with persistent weakness, memory issues, slurred speech on multple antihypertensives presented to [facility] er pm 9/17 with sev days n and v and d but no chest or abd pain with elevated procalcitonin and ? pna on pcxr but ct abd just showed atx and lactate level only 1.9 so PCCM service asked to evaluate for persistent hypotension p 1.5 liter fluid rx.   Sleeping ok without nocturnal  or early am exacerbation  of respiratory  c/o's or need for noct saba. Also denies any obvious fluctuation of symptoms with weather or environmental changes or other aggravating or alleviating factors except as outlined above   ROS  The following are not active complaints unless bolded sore throat, dysphagia, dental problems, itching, sneezing,  nasal congestion or excess/ purulent secretions, ear ache,   fever, chills, sweats, unintended wt loss, pleuritic or exertional cp, hemoptysis,  orthopnea pnd or leg swelling, presyncope, palpitations, heartburn, abdominal pain, anorexia, nausea, vomiting, diarrhea  or change in bowel or urinary habits, change in stools or urine, dysuria,hematuria,  rash, arthralgias, visual complaints, headache, numbness weakness or ataxia or problems with walking or coordination,  change in mood/affect or memory.         Past Medical History  Diagnosis Date  . SOB (shortness of breath)   . MI (myocardial infarction)   . Poor appetite   . Arthritis   . Fatigue   . Right ear pain   . Renal insufficiency   . RA (rheumatoid arthritis)   . PUD (peptic ulcer disease)   . GI bleed 2005  . GERD (gastroesophageal reflux disease)   . Hiatal hernia   . Hypothyroidism   . Osteoporosis   . Depression   . Anxiety   . Coronary artery disease   . CVA (cerebral vascular  accident)   . TIA (transient ischemic attack)   . Fall    Past Surgical History  Procedure Date  . Cardiac catheterization     SHOWED RUPTURE PLAQUE IN THE LAD. THE LAD IS NONOBSTRUCTIVE WITH ONLY 30-40% STENOSIS  . Nstemi 06/2010  . Spinal fusion surgery   . Knee surgery    Prior to Admission medications   Medication Sig Start Date End Date Taking? Authorizing Provider  acetaminophen (TYLENOL) 500 MG tablet Take 500 mg by mouth every 6 (six) hours as needed. For pain   Yes Historical Provider, MD  Ascorbic Acid (VITAMIN C PO) Take 500 mg by mouth daily.    Yes Historical Provider, MD  aspirin 81 MG chewable tablet Chew 81 mg by mouth daily.   Yes Historical Provider, MD  Cholecalciferol (VITAMIN D-3) 5000 UNITS TABS Take 2 capsules by mouth daily before supper.   Yes Historical Provider, MD  diazepam (VALIUM) 5 MG tablet Take 5 mg by mouth at bedtime.   Yes Historical Provider, MD  esomeprazole (NEXIUM) 40 MG capsule Take 40 mg by mouth daily before breakfast.    Yes Historical Provider, MD  folic acid (FOLVITE) 1 MG tablet Take 1 mg by mouth daily.    Yes Historical Provider, MD  HYDROcodone-acetaminophen (VICODIN) 5-500 MG per tablet Take 1 tablet by mouth every 6 (six) hours as needed. Pain   Yes Historical Provider, MD  levothyroxine (SYNTHROID,  LEVOTHROID) 50 MCG tablet Take 50 mcg by mouth daily.    Yes Historical Provider, MD  lisinopril-hydrochlorothiazide (PRINZIDE,ZESTORETIC) 20-12.5 MG per tablet Take 1 tablet by mouth daily.    Yes Historical Provider, MD  metoprolol (LOPRESSOR) 50 MG tablet Take 50 mg by mouth 2 (two) times daily.    Yes Historical Provider, MD  Multiple Vitamin (MULTIVITAMIN) tablet Take 1 tablet by mouth daily.     Yes Historical Provider, MD  Omega-3 Fatty Acids (FISH OIL) 1200 MG CAPS Take 1,200 mg by mouth daily.   Yes Historical Provider, MD  predniSONE (DELTASONE) 10 MG tablet Take 10 mg by mouth daily.   Yes Historical Provider, MD  rosuvastatin  (CRESTOR) 20 MG tablet Take 20 mg by mouth daily.    Yes Historical Provider, MD  sertraline (ZOLOFT) 50 MG tablet Take 50 mg by mouth daily.    Yes Historical Provider, MD  warfarin (COUMADIN) 1 MG tablet Take 1.5 mg by mouth daily. Take 1.5 tablet (1.5 mg total) daily   Yes Historical Provider, MD   Allergies Allergies  Allergen Reactions  . Codeine     sick  . Morphine And Related   . Percocet (Oxycodone-Acetaminophen)     unknown  . Plavix (Clopidogrel Bisulfate)   . Sulfur     Family History Family History  Problem Relation Age of Onset  . Kidney disease Mother   . Kidney disease Brother    Social History  reports that she has never smoked. She has never used smokeless tobacco. She reports that she does not drink alcohol or use illicit drugs.  Current Status: In ER appears comfortable aty 30 degrees hob. RA  Vital Signs: Temp:  [99.2 F (37.3 C)-102.4 F (39.1 C)] 100.8 F (38.2 C) (09/17 1658) Pulse Rate:  [85-96] 85  (09/17 1658) Resp:  [16-18] 16  (09/17 1658) BP: (94-128)/(39-65) 105/43 mmHg (09/17 1658) SpO2:  [97 %-99 %] 98 % (09/17 1658) FIO2 RA  Physical Examination: General:  Pale chronically not acutely ill  Neuro:  Alert slowed mentation, knows day of week not date HEENT:  oropharynx dry Neck:  Supple, no nodes Cardiovascular:  RRR P 85 Lungs: clear to a and p Abdomen:  Mildly distended, soft Musculoskeletal:  No obvious joint Skin:  Dry/pale  Active Problems:  Renal insufficiency  RA (rheumatoid arthritis)  Adrenal insufficiency  Shock  Dehydration   ASSESSMENT AND PLAN  PULMONARY No results found for this basename: PHART:5,PCO2:5,PCO2ART:5,PO2ART:5,HCO3:5,O2SAT:5 in the last 168 hours    CXR:  No def as dz 9/17  A: Atelectasis L base by CT abd 9/17, no pna rx mobilize/ IS when feasible, no need for abx for this though is also at risk asp/ ali  CARDIOVASCULAR  Lab 03/16/12 1400  TROPONINI --  LATICACIDVEN 1.9  PROBNP --    ECG: NSR no def ischemic change     A: Shock likely due to vol depletion/ adrenal insuff/ low grade sepsis while on mulitple hbp meds  Plan: Vol expand x 4-6 liters then recheck cbc  RENAL  Lab 03/16/12 1320  NA 137  K 4.6  CL 97  CO2 28  BUN 44*  CREATININE 1.51*  CALCIUM 9.9  MG --  PHOS --   Intake/Output    None    Foley:  Placed in er   A:   Acute renal failure secondary to shock, baseline creat 1.09 02/27/12 Plan: vol expand, d/c ACEI  GASTROINTESTINAL No results found for this basename: AST:5,ALT:5,ALKPHOS:5,BILITOT:5,PROT:5,ALBUMIN:5 in  the last 168 hours  A: Gastroenteritis vs [employer] Rx flagyl per ID section  HEMATOLOGIC  Lab 03/16/12 1320  HGB 10.4*  HCT 32.9*  PLT 226  INR --  APTT --   A:  On coumadin and looks quite pale P Check inr, recheck cbc p fluid  INFECTIOUS  Lab 03/16/12 1400 03/16/12 1320  WBC -- 22.8*  PROCALCITON 2.26 --   Cultures: Urine 9/17>> Blood x 2 9/17 >>>  Antibiotics: Vanc 9/17 > ER only Levaquin 9/17 > ER only Flagyl 9/17 >> Maxepime 9/17 >>  A: Possible pmc in setting of abx 3 weeks pta for uti P:   Continue flagyl, stop all other abx x maxepime until r/o gi sespsis from alternate source (unlikely in absence of any abd pain) or uti sepsis  ENDOCRINE No results found for this basename: GLUCAP:5 in the last 168 hours A:  On prenisone chronically so likely adrenally insufficient P  Hydrocortisone, stress doses  NEUROLOGIC  A: s/p L lacunar cva  11/2101  BEST PRACTICE / DISPOSITION Level of Care: ICU Primary Service:  PCCM Consultants:  None Code Status:  FUll Diet:  npo DVT Px:  pas GI Px: H2 as ? pmc Skin Integrity: ok Social / Family:  Daughter at bedside  The patient is critically ill with multiple organ systems failure and requires high complexity decision making for assessment and support, frequent evaluation and titration of therapies, application of advanced monitoring technologies and extensive  interpretation of multiple databases. Critical Care Time devoted to patient care services described in this note is 45 minutes.   Sandrea Hughs, M.D. Pulmonary and Critical Care Medicine [facility] Pager: [phone]  03/16/2012, 5:15 PM

## 2012-03-16 NOTE — ED Notes (Signed)
Patient unable to stand for orthostatic vital signs.

## 2012-03-16 NOTE — ED Notes (Signed)
Pts daughter Alesia-[phone]

## 2012-03-16 NOTE — ED Notes (Signed)
Per EMS pt in from [facility] c/o generalized weakness, and left shoulder/neck pain. Pt recently diagnosed with uti. Family reports similar symptoms with previous UTI.

## 2012-03-16 NOTE — ED Notes (Signed)
IV started. 1 light green, 1 lavender, 1 light blue, 1 dark green drawn to hold.

## 2012-03-17 ENCOUNTER — Inpatient Hospital Stay (HOSPITAL_COMMUNITY): Payer: Medicare Other

## 2012-03-17 LAB — CBC
HCT: 27 % — ABNORMAL LOW (ref 36.0–46.0)
Hemoglobin: 8.7 g/dL — ABNORMAL LOW (ref 12.0–15.0)
MCH: 28.6 pg (ref 26.0–34.0)
MCHC: 32.2 g/dL (ref 30.0–36.0)

## 2012-03-17 LAB — BASIC METABOLIC PANEL
BUN: 32 mg/dL — ABNORMAL HIGH (ref 6–23)
Calcium: 8.1 mg/dL — ABNORMAL LOW (ref 8.4–10.5)
GFR calc non Af Amer: 42 mL/min — ABNORMAL LOW (ref >90)
Glucose, Bld: 135 mg/dL — ABNORMAL HIGH (ref 70–99)

## 2012-03-17 LAB — PRO B NATRIURETIC PEPTIDE: Pro B Natriuretic peptide (BNP): 1429 pg/mL — ABNORMAL HIGH (ref 0–450)

## 2012-03-17 MED ORDER — METOPROLOL TARTRATE 25 MG PO TABS
25.0000 mg | ORAL_TABLET | Freq: Two times a day (BID) | ORAL | Status: DC
  Administered 2012-03-17 – 2012-03-19 (×5): 25 mg via ORAL
  Filled 2012-03-17 (×6): qty 1

## 2012-03-17 MED ORDER — PREDNISONE 10 MG PO TABS
10.0000 mg | ORAL_TABLET | Freq: Every day | ORAL | Status: DC
  Administered 2012-03-17 – 2012-03-19 (×3): 10 mg via ORAL
  Filled 2012-03-17 (×3): qty 1

## 2012-03-17 MED ORDER — DIAZEPAM 5 MG PO TABS
5.0000 mg | ORAL_TABLET | Freq: Every day | ORAL | Status: DC
  Administered 2012-03-17 – 2012-03-18 (×2): 5 mg via ORAL
  Filled 2012-03-17 (×2): qty 1

## 2012-03-17 MED ORDER — ASPIRIN 81 MG PO CHEW
81.0000 mg | CHEWABLE_TABLET | Freq: Every day | ORAL | Status: DC
  Administered 2012-03-18 – 2012-03-19 (×2): 81 mg via ORAL
  Filled 2012-03-17 (×3): qty 1

## 2012-03-17 MED ORDER — LEVOTHYROXINE SODIUM 50 MCG PO TABS
50.0000 ug | ORAL_TABLET | Freq: Every day | ORAL | Status: DC
  Administered 2012-03-17 – 2012-03-19 (×3): 50 ug via ORAL
  Filled 2012-03-17 (×3): qty 1

## 2012-03-17 MED ORDER — PANTOPRAZOLE SODIUM 40 MG PO TBEC
80.0000 mg | DELAYED_RELEASE_TABLET | Freq: Every day | ORAL | Status: DC
  Administered 2012-03-17 – 2012-03-19 (×3): 80 mg via ORAL
  Filled 2012-03-17 (×3): qty 2

## 2012-03-17 MED ORDER — HYDROCORTISONE SOD SUCCINATE 100 MG IJ SOLR: 50.0000 mg | Freq: Four times a day (QID) | INTRAMUSCULAR | Status: DC

## 2012-03-17 MED ORDER — SERTRALINE HCL 50 MG PO TABS
50.0000 mg | ORAL_TABLET | Freq: Every day | ORAL | Status: DC
  Administered 2012-03-17 – 2012-03-19 (×3): 50 mg via ORAL
  Filled 2012-03-17 (×3): qty 1

## 2012-03-17 MED ORDER — HYDROCODONE-ACETAMINOPHEN 5-325 MG PO TABS
1.0000 | ORAL_TABLET | Freq: Four times a day (QID) | ORAL | Status: DC | PRN
  Administered 2012-03-17 – 2012-03-19 (×3): 1 via ORAL
  Filled 2012-03-17 (×3): qty 1

## 2012-03-17 NOTE — Clinical Social Work Psychosocial (Signed)
Clinical Social Work Department BRIEF PSYCHOSOCIAL ASSESSMENT 03/17/2012  Patient:  [name], [name]     Account Number:  [id number]     Admit date:  03/16/2012  Clinical Social Worker:  Jodelle Red  Date/Time:  03/17/2012 09:30 AM  Referred by:  Physician  Date Referred:  03/17/2012 Referred for  SNF Placement   Other Referral:   Interview type:  Patient Other interview type:   chart review    PSYCHOSOCIAL DATA Living Status:  FACILITY Admitted from facility:  [facility] Level of care:  Skilled Nursing Facility Primary support name:  Ramon Dredge Primary support relationship to patient:  CHILD, ADULT Degree of support available:   good    CURRENT CONCERNS Current Concerns  Post-Acute Placement   Other Concerns:    SOCIAL WORK ASSESSMENT / PLAN Pt from SNF and plans to return there at d/c. Pt will need PT/OT consult to assist with return to SNF. Pt states things were going well there and she is eager to return to SNF. CSW will confirm plan with SNF and update needed paperwork.   Assessment/plan status:  Psychosocial Support/Ongoing Assessment of Needs Other assessment/ plan:   return to [facility]   Information/referral to community resources:    PATIENT'S/FAMILY'S RESPONSE TO PLAN OF CARE: Pt agrees to return to [facility] at d/c. CSW to begin process.    [name], LCSWA ICU/Stepdown Clinical Social Worker [facility] [phone] Hours 8am-1200pm M-F

## 2012-03-17 NOTE — Progress Notes (Signed)
Pt requesting medications which she usually takes at home for sleep and pain.  Notified MD.  Received new orders.  Will continue to monitor pt. [name] 9:15 PM 03/17/2012

## 2012-03-17 NOTE — Progress Notes (Signed)
LM for [facility] Admissions.  Providence Crosby, LCSWA Clinical Social Work [phone]

## 2012-03-17 NOTE — Progress Notes (Addendum)
Name: [name] MRN: [id number] DOB: July 10, 1929    LOS: 1  Referring Provider:  [facility] ER/ [place] Reason for Referral:  Shock  PULMONARY / CRITICAL CARE MEDICINE  HPI:  69 yowf with steroid dep RA sp L hem cva 11/2011 > NHP with persistent weakness, memory issues, slurred speech on multple antihypertensives presented to [facility] pm 9/17 with sev days n and v and d but no chest or abd pain with elevated procalcitonin and ? pna on pcxr but ct abd just showed atx and lactate level only 1.9 so PCCM service asked to evaluate for persistent hypotension p 1.5 liter fluid rx.  UA 7-10 WCs, h/o interstitial cystitis   Current Status: Comfortable  VSS BP improved, good UO  Vital Signs: Temp:  [97.3 F (36.3 C)-102.4 F (39.1 C)] 97.8 F (36.6 C) (09/18 0400) Pulse Rate:  [78-96] 79  (09/18 0600) Resp:  [15-21] 15  (09/18 0600) BP: (94-136)/(39-65) 136/63 mmHg (09/18 0600) SpO2:  [90 %-100 %] 93 % (09/18 0600) Weight:  [74.027 kg (163 lb 3.2 oz)-74.4 kg (164 lb 0.4 oz)] 74.4 kg (164 lb 0.4 oz) (09/18 0000)   Physical Examination: General:  Pale chronically not acutely ill  Neuro:  Alert slowed mentation, knows day of week not date, speech slow HEENT:  oropharynx dry Neck:  Supple, no nodes, -lan Cardiovascular:  RRR P Lungs:  decreased air movement, faint basilar crackles Abdomen:  Mildly distended, soft, +bs Musculoskeletal:  No obvious joint deformity Skin:  Dry/pale/warm  Active Problems:  Renal insufficiency  RA (rheumatoid arthritis)  Adrenal insufficiency  Shock  Dehydration   ASSESSMENT AND PLAN  PULMONARY No results found for this basename: PHART:5,PCO2:5,PCO2ART:5,PO2ART:5,HCO3:5,O2SAT:5 in the last 168 hours    CXR:  No def as dz 9/17 9/18: Poor aeration. Probable atelectasis and mild pulmonary vascular  congestion A: Atelectasis L base by CT abd 9/17, no pna rx mobilize/ IS when feasible, no need for specific  abx for this(on Maxipen) though is also at risk asp/  ali  CARDIOVASCULAR  Lab 03/17/12 0530 03/16/12 2352 03/16/12 1755 03/16/12 1400  TROPONINI <0.30 <0.30 <0.30 --  LATICACIDVEN -- -- -- 1.9  PROBNP -- -- -- --   ECG: NSR no def ischemic change     A: Shock likely due to vol depletion/ adrenal insuff/ low grade sepsis while on mulitple hbp meds  Plan: Vol expanded x 4-6 liters then rechecked cbc h/h adequate. Shock resolved 9/18 with fluid resuscitation.            9/18 dc IVF to prevent overload            RENAL  Lab 03/17/12 0530 03/16/12 1320  NA 140 137  K 4.2 4.6  CL 108 97  CO2 23 28  BUN 32* 44*  CREATININE 1.19* 1.51*  CALCIUM 8.1* 9.9  MG 1.7 --  PHOS 2.6 --   Intake/Output      09/17 0701 - 09/18 0700 09/18 0701 - 09/19 0700   I.V. (mL/kg) 3200 (43)    IV Piggyback 300    Total Intake(mL/kg) 3500 (47)    Urine (mL/kg/hr) 875 (0.5)    Total Output 875    Net +2625          Foley:  Placed in er  Lab Results  Component Value Date   CREATININE 1.19* 03/17/2012   CREATININE 1.51* 03/16/2012   CREATININE 1.09 02/27/2012    A:   Acute renal failure secondary to shock, baseline  creat 1.09 02/27/12 Plan: vol expand, d/c ACEI  GASTROINTESTINAL No results found for this basename: AST:5,ALT:5,ALKPHOS:5,BILITOT:5,PROT:5,ALBUMIN:5 in the last 168 hours  A: Gastroenteritis vs [facility] Rx flagyl per ID section  HEMATOLOGIC  Lab 03/17/12 0530 03/16/12 1755 03/16/12 1320  HGB 8.7* 8.6* 10.4*  HCT 27.0* 26.7* 32.9*  PLT 173 186 226  INR -- 2.79* --  APTT -- 65* --   A:  On coumadin, fall in Hb P  Await guiaic  INFECTIOUS  Lab 03/17/12 0530 03/16/12 1755 03/16/12 1400 03/16/12 1320  WBC 14.4* 18.9* -- 22.8*  PROCALCITON -- -- 2.26 --   Cultures: Urine 9/17>> Blood x 2 9/17 >>>  Antibiotics: Vanc 9/17 > ER only Levaquin 9/17 > ER only Flagyl 9/17 >> Maxepime 9/17 >>  A: Possible pmc in setting of abx 3 weeks pta for uti P:   Continue flagyl, maxepime until r/o gi sespsis  (unlikely in absence of any  abd pain) or uti   ENDOCRINE No results found for this basename: GLUCAP:5 in the last 168 hours A:  On prenisone chronically so likely adrenally insufficient P  Hydrocortisone, stress doses given - change back to PO pred  NEUROLOGIC  A: s/p L lacunar cva  11/2101  Will ask dr Jacky Kindle to take over, PCCM to sign off, transfer to tele Resume BP meds soon, fu cx & simplify abx  BEST PRACTICE / DISPOSITION Level of Care: SDU as of 9/18 Primary Service:  PCCM Consultants:  None Code Status:  FUll Diet:  Heart healthy DVT Px:  pas GI Px: H2 as ? pmc Skin Integrity: ok Social / Family:  No family at bedside  [facility] Minor ACNP Adolph Pollack PCCM Pager [phone] till 3 pm If no answer page [phone] 03/17/2012, 8:10 AM  ALVA,RAKESH V.  [phone]

## 2012-03-17 NOTE — Progress Notes (Signed)
CARE MANAGEMENT NOTE 03/17/2012  Patient:  [name], [name]   Account Number:  [id number]  Date Initiated:  03/17/2012  Documentation initiated by:  Ananda Sitzer  Subjective/Objective Assessment:   patient admitted with temp great than 102, wbc over 14, akf, elvated bun and creat, iv abx and iv volume expander started to support soft bp.     Action/Plan:   home   Anticipated DC Date:  03/20/2012   Anticipated DC Plan:  HOME/SELF CARE  In-house referral  NA      DC Planning Services  NA      [facility] Choice  NA   Choice offered to / List presented to:  NA   DME arranged  NA      DME agency  NA     HH arranged  NA      HH agency  NA   Status of service:  In process, will continue to follow Medicare Important Message given?  NA - LOS <3 / Initial given by admissions (If response is "NO", the following Medicare IM given date fields will be blank) Date Medicare IM given:   Date Additional Medicare IM given:    Discharge Disposition:    Per UR Regulation:  Reviewed for med. necessity/level of care/duration of stay  If discussed at Long Length of Stay Meetings, dates discussed:    Comments:  [id number] Earlene Plater, RN, BSN, CCM: CHART REVIEWED AND UPDATED. NO DISCHARGE NEEDS PRESENT AT THIS TIME. CASE MANAGEMENT [phone]

## 2012-03-18 ENCOUNTER — Inpatient Hospital Stay (HOSPITAL_COMMUNITY): Payer: Medicare Other

## 2012-03-18 DIAGNOSIS — R0602 Shortness of breath: Secondary | ICD-10-CM

## 2012-03-18 LAB — BASIC METABOLIC PANEL
BUN: 30 mg/dL — ABNORMAL HIGH (ref 6–23)
CO2: 24 mEq/L (ref 19–32)
Chloride: 109 mEq/L (ref 96–112)
Creatinine, Ser: 1.09 mg/dL (ref 0.50–1.10)
Glucose, Bld: 95 mg/dL (ref 70–99)
Potassium: 3.5 mEq/L (ref 3.5–5.1)

## 2012-03-18 LAB — CBC
HCT: 25.4 % — ABNORMAL LOW (ref 36.0–46.0)
Hemoglobin: 8.2 g/dL — ABNORMAL LOW (ref 12.0–15.0)
MCV: 88.2 fL (ref 78.0–100.0)
RBC: 2.88 MIL/uL — ABNORMAL LOW (ref 3.87–5.11)
RDW: 15.6 % — ABNORMAL HIGH (ref 11.5–15.5)
WBC: 14.5 10*3/uL — ABNORMAL HIGH (ref 4.0–10.5)

## 2012-03-18 LAB — PROTIME-INR
INR: 2.59 — ABNORMAL HIGH (ref 0.00–1.49)
Prothrombin Time: 26.5 seconds — ABNORMAL HIGH (ref 11.6–15.2)

## 2012-03-18 LAB — URINE CULTURE
Colony Count: NO GROWTH
Culture: NO GROWTH

## 2012-03-18 LAB — PRO B NATRIURETIC PEPTIDE: Pro B Natriuretic peptide (BNP): 3700 pg/mL — ABNORMAL HIGH (ref 0–450)

## 2012-03-18 MED ORDER — LEVOFLOXACIN 750 MG PO TABS
750.0000 mg | ORAL_TABLET | ORAL | Status: DC
  Administered 2012-03-18: 750 mg via ORAL
  Filled 2012-03-18: qty 2

## 2012-03-18 MED ORDER — POTASSIUM CHLORIDE CRYS ER 20 MEQ PO TBCR
30.0000 meq | EXTENDED_RELEASE_TABLET | Freq: Two times a day (BID) | ORAL | Status: DC
  Administered 2012-03-18 – 2012-03-19 (×3): 30 meq via ORAL
  Filled 2012-03-18 (×4): qty 1

## 2012-03-18 MED ORDER — FUROSEMIDE 10 MG/ML IJ SOLN
40.0000 mg | Freq: Once | INTRAMUSCULAR | Status: DC
  Filled 2012-03-18: qty 4

## 2012-03-18 NOTE — Progress Notes (Signed)
Name: [name] MRN: [id number] DOB: 23-Aug-1929    LOS: 2  Referring Provider:  [facility] ER/ [place] Reason for Referral:  Shock  PULMONARY / CRITICAL CARE MEDICINE  HPI:  77 yowf with steroid dep RA sp L hem cva 11/2011 > NHP with persistent weakness, memory issues, slurred speech on multple antihypertensives presented to [facility] er pm 9/17 with sev days n and v and d but no chest or abd pain with elevated procalcitonin and ? pna on pcxr but ct abd just showed atx and lactate level only 1.9 so PCCM service asked to evaluate for persistent hypotension p 1.5 liter fluid rx.  UA 7-10 WCs, h/o interstitial cystitis   Current Status: Comfortable  VSS BP improved,  UO poor Denies pain  Vital Signs: Temp:  [97.8 F (36.6 C)-98.6 F (37 C)] 98.6 F (37 C) (09/19 1107) Pulse Rate:  [73-94] 88  (09/19 1107) Resp:  [17-18] 18  (09/19 1107) BP: (132-167)/(64-77) 162/64 mmHg (09/19 1107) SpO2:  [94 %-100 %] 100 % (09/19 1107) Weight:  [76.8 kg (169 lb 5 oz)-77.202 kg (170 lb 3.2 oz)] 77.202 kg (170 lb 3.2 oz) (09/19 0515)   Physical Examination: General:  Pale chronically not acutely ill  Neuro:  Alert, oriented x 3, non focal HEENT:  oropharynx dry Neck:  Supple, no nodes, -lan Cardiovascular:  RRR P Lungs:  decreased air movement, faint basilar crackles Abdomen:  Mildly distended, soft, +bs Musculoskeletal:  No obvious joint deformity Skin:  Dry/pale/warm  Active Problems:  Renal insufficiency  RA (rheumatoid arthritis)  Adrenal insufficiency  Shock  Dehydration   ASSESSMENT AND PLAN  PULMONARY No results found for this basename: PHART:5,PCO2:5,PCO2ART:5,PO2ART:5,HCO3:5,O2SAT:5 in the last 168 hours    CXR:  No def as dz 9/17 9/19:Lt basal ASD worse - Probable atelectasis and mild pulmonary vascular congestion A: Atelectasis L base by CT abd 9/17, no pna rx mobilize/ IS when feasible, no need for specific  abx for this though is also at risk asp/ ali  CARDIOVASCULAR  Lab  03/17/12 0530 03/16/12 2352 03/16/12 1755 03/16/12 1400  TROPONINI <0.30 <0.30 <0.30 --  LATICACIDVEN -- -- -- 1.9  PROBNP 1429.0* -- -- --   ECG: NSR no def ischemic change     A: Shock likely due to vol depletion/ adrenal insuff/ low grade sepsis while on mulitple hbp meds  Plan: Vol expanded x 4-6 liters . Shock resolved 9/18 with fluid resuscitation.            9/18 dc IVF to prevent overload            RENAL  Lab 03/18/12 0343 03/17/12 0530 03/16/12 1320  NA 141 140 137  K 3.5 4.2 --  CL 109 108 97  CO2 24 23 28   BUN 30* 32* 44*  CREATININE 1.09 1.19* 1.51*  CALCIUM 8.3* 8.1* 9.9  MG 1.7 1.7 --  PHOS 2.9 2.6 --   Intake/Output      09/18 0701 - 09/19 0700 09/19 0701 - 09/20 0700   P.O. 540    I.V. (mL/kg) 450 (5.8)    IV Piggyback 357    Total Intake(mL/kg) 1347 (17.4)    Urine (mL/kg/hr) 400 (0.2)    Total Output 400    Net +947         Urine Occurrence 4 x    Stool Occurrence 1 x     Foley:  Placed in er  Lab Results  Component Value Date   CREATININE 1.09  03/18/2012   CREATININE 1.19* 03/17/2012   CREATININE 1.51* 03/16/2012    A:   Acute renal failure secondary to shock, baseline creat 1.09 02/27/12 Plan: back to baseline, d/c ACEI  GASTROINTESTINAL No results found for this basename: AST:5,ALT:5,ALKPHOS:5,BILITOT:5,PROT:5,ALBUMIN:5 in the last 168 hours  A: ? Gastroenteritis vs [facility] Rx flagyl per ID section  HEMATOLOGIC  Lab 03/18/12 0343 03/17/12 0530 03/16/12 1755 03/16/12 1320  HGB 8.2* 8.7* 8.6* 10.4*  HCT 25.4* 27.0* 26.7* 32.9*  PLT 174 173 186 226  INR 2.59* -- 2.79* --  APTT -- -- 65* --   A:  On coumadin, fall in Hb, ? dilution P  Await guiaic  INFECTIOUS  Lab 03/18/12 0343 03/17/12 0530 03/16/12 1755 03/16/12 1400 03/16/12 1320  WBC 14.5* 14.4* 18.9* -- 22.8*  PROCALCITON -- -- -- 2.26 --   Cultures: Urine 9/17>>ng Blood x 2 9/17 ng>>>  Antibiotics: Vanc 9/17 > ER only Levaquin 9/17 > ER only Flagyl 9/17 >>9/19 Maxepime  9/17 >>9/19  A: Possible pmc in setting of abx 3 weeks pta for uti P:   dc flagyl, maxepime  Change to PO levaquin x 5 more ds for uti /LLL atx & GI  ENDOCRINE No results found for this basename: GLUCAP:5 in the last 168 hours A:  On prenisone chronically so likely adrenally insufficient P  Hydrocortisone, stress doses given - change back to PO pred  NEUROLOGIC  A: s/p L lacunar cva  11/2101   PCCM to sign off - she sees [place] [facility] -dr Glade Lloyd at [facility] Resume home meds  Theia Dezeeuw V.  [phone]

## 2012-03-18 NOTE — Progress Notes (Deleted)
Name: [name] MRN: [id number] DOB: March 22, 1930    LOS: 2  Referring Provider:  [facility] ER/ [place] Reason for Referral:  Shock  PULMONARY / CRITICAL CARE MEDICINE  HPI:  41 yowf with steroid dep RA sp L hem cva 11/2011 > NHP with persistent weakness, memory issues, slurred speech on multple antihypertensives presented to [facility] pm 9/17 with sev days n and v and d but no chest or abd pain with elevated procalcitonin and ? pna on pcxr but ct abd just showed atx and lactate level only 1.9 so PCCM service asked to evaluate for persistent hypotension p 1.5 liter fluid rx.  UA 7-10 WCs, h/o interstitial cystitis   Current Status:  She took O2 off and became sob.   Vital Signs: Temp:  [97.8 F (36.6 C)-98.6 F (37 C)] 97.8 F (36.6 C) (09/19 0515) Pulse Rate:  [73-94] 73  (09/19 0515) Resp:  [17-18] 17  (09/19 0515) BP: (132-167)/(74-77) 154/76 mmHg (09/19 0515) SpO2:  [94 %-99 %] 99 % (09/19 0515) Weight:  [76.8 kg (169 lb 5 oz)-77.202 kg (170 lb 3.2 oz)] 77.202 kg (170 lb 3.2 oz) (09/19 0515)   Physical Examination: General:  Pale chronically not acutely ill  Neuro:  Alert more awake HEENT:  oropharynx dry Neck:  Supple, no nodes, -lan Cardiovascular:  RRR P Lungs:  decreased air movement, faint basilar crackles.  Abdomen:  Mildly distended, soft, +bs Musculoskeletal:  No obvious joint deformity Skin:  Dry/pale/warm  Active Problems:  Renal insufficiency  RA (rheumatoid arthritis)  Adrenal insufficiency  Shock  Dehydration   ASSESSMENT AND PLAN  PULMONARY No results found for this basename: PHART:5,PCO2:5,PCO2ART:5,PO2ART:5,HCO3:5,O2SAT:5 in the last 168 hours    CXR:  No def as dz 9/17 9/18: Poor aeration. Probable atelectasis and mild pulmonary vascular  Congestion 9/19 Worsened left basilar airspace disease could be due to atelectasis  or pneumonia.  A: Atelectasis L base by CT abd 9/17, no pna, hypoxia. Questionable fluid overload from fluid resuscitation. rx  mobilize/ IS when feasible, no need for specific  abx for this(on Maxipen) though is also at risk asp/ ali continue max for now kvo fluids Gentle diuresis may be needed.  CARDIOVASCULAR  Lab 03/17/12 0530 03/16/12 2352 03/16/12 1755 03/16/12 1400  TROPONINI <0.30 <0.30 <0.30 --  LATICACIDVEN -- -- -- 1.9  PROBNP 1429.0* -- -- --   ECG: NSR no def ischemic change     A: Shock likely due to vol depletion/ adrenal insuff/ low grade sepsis while on mulitple hbp meds  Plan: Vol expanded x 4-6 liters then rechecked cbc h/h adequate. Shock resolved 9/18 with fluid resuscitation.            9/18 dc IVF to prevent overload            RENAL  Lab 03/18/12 0343 03/17/12 0530 03/16/12 1320  NA 141 140 137  K 3.5 4.2 --  CL 109 108 97  CO2 24 23 28   BUN 30* 32* 44*  CREATININE 1.09 1.19* 1.51*  CALCIUM 8.3* 8.1* 9.9  MG 1.7 1.7 --  PHOS 2.9 2.6 --   Intake/Output      09/18 0701 - 09/19 0700 09/19 0701 - 09/20 0700   P.O. 540    I.V. (mL/kg) 450 (5.8)    IV Piggyback 357    Total Intake(mL/kg) 1347 (17.4)    Urine (mL/kg/hr) 400 (0.2)    Total Output 400    Net +947  Urine Occurrence 4 x    Stool Occurrence 1 x     Foley:  Placed in er  Lab Results  Component Value Date   CREATININE 1.09 03/18/2012   CREATININE 1.19* 03/17/2012   CREATININE 1.51* 03/16/2012    A:   Acute renal failure secondary to shock, baseline creat 1.09 02/27/12 Plan: vol expand, d/c ACEI  GASTROINTESTINAL No results found for this basename: AST:5,ALT:5,ALKPHOS:5,BILITOT:5,PROT:5,ALBUMIN:5 in the last 168 hours  A: Gastroenteritis vs [facility] Rx flagyl per ID section  HEMATOLOGIC  Lab 03/18/12 0343 03/17/12 0530 03/16/12 1755 03/16/12 1320  HGB 8.2* 8.7* 8.6* 10.4*  HCT 25.4* 27.0* 26.7* 32.9*  PLT 174 173 186 226  INR 2.59* -- 2.79* --  APTT -- -- 65* --   A:  On coumadin, fall in Hb P  Await guiaic  INFECTIOUS  Lab 03/18/12 0343 03/17/12 0530 03/16/12 1755 03/16/12 1400 03/16/12 1320    WBC 14.5* 14.4* 18.9* -- 22.8*  PROCALCITON -- -- -- 2.26 --   Cultures: Urine 9/17>>neg Blood x 2 9/17 >>>  Antibiotics: Vanc 9/17 > ER only Levaquin 9/17 > ER only Flagyl 9/17 >> Maxepime 9/17 >>  A: Possible pmc in setting of abx 3 weeks pta for uti P:   Continue flagyl, maxepime until r/o gi sespsis  (unlikely in absence of any abd pain) or uti .   ENDOCRINE No results found for this basename: GLUCAP:5 in the last 168 hours A:  On prenisone chronically so likely adrenally insufficient P  Hydrocortisone, stress doses given - changed back to PO pred  NEUROLOGIC  A: s/p L lacunar cva  11/2101  Will ask [facility] to take over 9/20, PCCM to sign off. Resume BP meds soon, fu cx & simplify abx  BEST PRACTICE / DISPOSITION Level of Care: SDU as of 9/18 Primary Service:  PCCM Consultants:  None Code Status:  FUll Diet:  Heart healthy DVT Px:  pas GI Px: H2 as ? pmc Skin Integrity: ok Social / Family:  No family at bedside  [facility] Minor ACNP Adolph Pollack PCCM Pager [phone] till 3 pm If no answer page [phone] 03/18/2012, 11:07 AM  MINOR, Chrissie Noa  [phone]

## 2012-03-18 NOTE — Progress Notes (Signed)
T/c from Tammie at [facility].  Per Tammie, Pt has a bed hold and can return upon d/c.  CSW thanked Tammie for her time.  Providence Crosby, LCSWA Clinical Social Work [phone]

## 2012-03-18 NOTE — Progress Notes (Signed)
ANTIBIOTIC CONSULT NOTE - FOLLOW UP  Pharmacy Consult for adjust antibiotics Indication: ? PNA/UTI/intra-abd  -Levaquin PO started, adjust dose to 750mg  PO q48h based on renal function and indication  Allergies  Allergen Reactions  . Codeine     sick  . Morphine And Related   . Percocet (Oxycodone-Acetaminophen)     unknown  . Plavix (Clopidogrel Bisulfate)   . Sulfur     Patient Measurements: Height: 5\' 5"  (165.1 cm) Weight: 170 lb 3.2 oz (77.202 kg) IBW/kg (Calculated) : 57  Adjusted Body Weight:   Vital Signs: Temp: 98.6 F (37 C) (09/19 1107) Temp src: Oral (09/19 1107) BP: 162/64 mmHg (09/19 1107) Pulse Rate: 88  (09/19 1107) Intake/Output from previous day: 09/18 0701 - 09/19 0700 In: 1347 [P.O.:540; I.V.:450; IV Piggyback:357] Out: 400 [Urine:400] Intake/Output from this shift:    Labs:  Basename 03/18/12 0343 03/17/12 0530 03/16/12 1755 03/16/12 1320  WBC 14.5* 14.4* 18.9* --  HGB 8.2* 8.7* 8.6* --  PLT 174 173 186 --  LABCREA -- -- -- --  CREATININE 1.09 1.19* -- 1.51*   Estimated Creatinine Clearance: 42.3 ml/min (by C-G formula based on Cr of 1.09). No results found for this basename: VANCOTROUGH:2,VANCOPEAK:2,VANCORANDOM:2,GENTTROUGH:2,GENTPEAK:2,GENTRANDOM:2,TOBRATROUGH:2,TOBRAPEAK:2,TOBRARND:2,AMIKACINPEAK:2,AMIKACINTROU:2,AMIKACIN:2, in the last 72 hours   Microbiology: Recent Results (from the past 720 hour(s))  CULTURE, BLOOD (ROUTINE X 2)     Status: Normal (Preliminary result)   Collection Time   03/16/12  2:00 PM      Component Value Range Status Comment   Specimen Description BLOOD LEFT ARM   Final    Special Requests BOTTLES DRAWN AEROBIC AND ANAEROBIC   Final    Culture  Setup Time 03/17/2012 01:08   Final    Culture     Final    Value:        BLOOD CULTURE RECEIVED NO GROWTH TO DATE CULTURE WILL BE HELD FOR 5 DAYS BEFORE ISSUING A FINAL NEGATIVE REPORT   Report Status PENDING   Incomplete   CULTURE, BLOOD (ROUTINE X 2)      Status: Normal (Preliminary result)   Collection Time   03/16/12  2:05 PM      Component Value Range Status Comment   Specimen Description BLOOD LEFT ARM   Final    Special Requests BOTTLES DRAWN AEROBIC ONLY   Final    Culture  Setup Time 03/17/2012 01:08   Final    Culture     Final    Value:        BLOOD CULTURE RECEIVED NO GROWTH TO DATE CULTURE WILL BE HELD FOR 5 DAYS BEFORE ISSUING A FINAL NEGATIVE REPORT   Report Status PENDING   Incomplete   URINE CULTURE     Status: Normal   Collection Time   03/16/12  2:26 PM      Component Value Range Status Comment   Specimen Description URINE, CATHETERIZED   Final    Special Requests NONE   Final    Culture  Setup Time 03/17/2012 01:17   Final    Colony Count NO GROWTH   Final    Culture NO GROWTH   Final    Report Status 03/18/2012 FINAL   Final   MRSA PCR SCREENING     Status: Normal   Collection Time   03/16/12  6:56 PM      Component Value Range Status Comment   MRSA by PCR NEGATIVE  NEGATIVE Final     Anti-infectives     Start  Dose/Rate Route Frequency Ordered Stop   03/18/12 1300   levofloxacin (LEVAQUIN) tablet 750 mg        750 mg Oral Every 48 hours 03/18/12 1122     03/17/12 1600   vancomycin (VANCOCIN) IVPB 1000 mg/200 mL premix  Status:  Discontinued        1,000 mg 200 mL/hr over 60 Minutes Intravenous Every 24 hours 03/16/12 1648 03/16/12 1743   03/16/12 1800   metroNIDAZOLE (FLAGYL) IVPB 500 mg  Status:  Discontinued        500 mg 100 mL/hr over 60 Minutes Intravenous Every 8 hours 03/16/12 1743 03/18/12 1122   03/16/12 1730   ceFEPIme (MAXIPIME) 1 g in dextrose 5 % 50 mL IVPB  Status:  Discontinued        1 g 100 mL/hr over 30 Minutes Intravenous Every 8 hours 03/16/12 1622 03/16/12 1639   03/16/12 1700   levofloxacin (LEVAQUIN) IVPB 750 mg  Status:  Discontinued        750 mg 100 mL/hr over 90 Minutes Intravenous Every 48 hours 03/16/12 1642 03/16/12 1743   03/16/12 1645   ceFEPIme (MAXIPIME) 1 g in  dextrose 5 % 50 mL IVPB  Status:  Discontinued        1 g 100 mL/hr over 30 Minutes Intravenous Every 24 hours 03/16/12 1639 03/18/12 1122   03/16/12 1630   levofloxacin (LEVAQUIN) IVPB 750 mg  Status:  Discontinued        750 mg 100 mL/hr over 90 Minutes Intravenous Every 24 hours 03/16/12 1622 03/16/12 1642   03/16/12 1630   vancomycin (VANCOCIN) 1,250 mg in sodium chloride 0.9 % 250 mL IVPB        1,250 mg 166.7 mL/hr over 90 Minutes Intravenous To Emergency Dept 03/16/12 1635 03/16/12 1852          Suzette Battiest, Tad Moore 03/18/2012,11:26 AM

## 2012-03-19 ENCOUNTER — Inpatient Hospital Stay (HOSPITAL_COMMUNITY): Payer: Medicare Other

## 2012-03-19 DIAGNOSIS — J189 Pneumonia, unspecified organism: Secondary | ICD-10-CM

## 2012-03-19 MED ORDER — POTASSIUM CHLORIDE CRYS ER 15 MEQ PO TBCR: 30.0000 meq | EXTENDED_RELEASE_TABLET | Freq: Every day | ORAL | Status: DC

## 2012-03-19 MED ORDER — HYDROCODONE-ACETAMINOPHEN 5-500 MG PO TABS: 1.0000 | ORAL_TABLET | Freq: Four times a day (QID) | ORAL | Status: DC | PRN

## 2012-03-19 MED ORDER — WARFARIN SODIUM 1 MG PO TABS: 1.5000 mg | ORAL_TABLET | Freq: Every day | ORAL | Status: DC

## 2012-03-19 MED ORDER — FUROSEMIDE 40 MG PO TABS
40.0000 mg | ORAL_TABLET | Freq: Once | ORAL | Status: DC
  Filled 2012-03-19 (×2): qty 1

## 2012-03-19 MED ORDER — LEVOFLOXACIN 750 MG PO TABS: 750.0000 mg | ORAL_TABLET | ORAL | Status: DC

## 2012-03-19 NOTE — Progress Notes (Signed)
Name: [name] MRN: [id number] DOB: [birth year]    LOS: 3  Referring Provider:  [facility]/ [place] Reason for Referral:  Shock  PULMONARY / CRITICAL CARE MEDICINE  HPI:  50 yowf with steroid dep RA sp L hem cva 11/2011 > NHP with persistent weakness, memory issues, slurred speech on multple antihypertensives presented to [facility] er pm 9/17 with sev days n and v and d but no chest or abd pain with elevated procalcitonin and ? pna on pcxr but ct abd just showed atx and lactate level only 1.9 so PCCM service asked to evaluate for persistent hypotension p 1.5 liter fluid rx.  UA 7-10 WCs, h/o interstitial cystitis   Current Status: Comfortable  VSS O2 improved Denies pain  Vital Signs: Temp:  [97.9 F (36.6 C)-98.6 F (37 C)] 97.9 F (36.6 C) (09/20 0548) Pulse Rate:  [77-88] 84  (09/20 0548) Resp:  [16-18] 16  (09/20 0548) BP: (159-182)/(64-102) 159/86 mmHg (09/20 0548) SpO2:  [94 %-100 %] 94 % (09/20 0548) Weight:  [77.565 kg (171 lb)] 77.565 kg (171 lb) (09/20 0548)   Physical Examination: General:  Pale chronically not acutely ill  Neuro:  Alert, oriented x 3, non focal HEENT:  oropharynx dry Neck:  Supple, no nodes, -lan Cardiovascular:  RRR P Lungs:  decreased air movement, faint basilar crackles Abdomen:  Mildly distended, soft, +bs Musculoskeletal:  No obvious joint deformity Skin:  Dry/pale/warm  Active Problems:  Renal insufficiency  RA (rheumatoid arthritis)  Adrenal insufficiency  Shock  Dehydration   ASSESSMENT AND PLAN  PULMONARY No results found for this basename: PHART:5,PCO2:5,PCO2ART:5,PO2ART:5,HCO3:5,O2SAT:5 in the last 168 hours    CXR:  No def as dz 9/17 9/19:Lt basal ASD worse - Probable atelectasis and mild pulmonary vascular congestion A: Atelectasis L base by CT abd 9/17, doubt pna  mobilize oob/ IS -poor effort  CARDIOVASCULAR  Lab 03/18/12 0400 03/17/12 0530 03/16/12 2352 03/16/12 1755 03/16/12 1400  TROPONINI -- <0.30 <0.30 <0.30 --    LATICACIDVEN -- -- -- -- 1.9  PROBNP 3700.0* 1429.0* -- -- --   ECG: NSR no def ischemic change     A: Shock likely due to vol depletion/ adrenal insuff/ low grade sepsis while on mulitple hbp meds  Plan: Vol expanded x 4-6 liters . Shock resolved 9/18 with fluid resuscitation.          Lasix 40 x 1 , bnp high            RENAL  Lab 03/18/12 0343 03/17/12 0530 03/16/12 1320  NA 141 140 137  K 3.5 4.2 --  CL 109 108 97  CO2 24 23 28   BUN 30* 32* 44*  CREATININE 1.09 1.19* 1.51*  CALCIUM 8.3* 8.1* 9.9  MG 1.7 1.7 --  PHOS 2.9 2.6 --   Intake/Output      09/19 0701 - 09/20 0700 09/20 0701 - 09/21 0700   P.O. 120    I.V. (mL/kg)     IV Piggyback     Total Intake(mL/kg) 120 (1.5)    Urine (mL/kg/hr)     Total Output     Net +120         Urine Occurrence 5 x    Stool Occurrence 1 x     Foley:  Placed in er  Lab Results  Component Value Date   CREATININE 1.09 03/18/2012   CREATININE 1.19* 03/17/2012   CREATININE 1.51* 03/16/2012    A:   Acute renal failure secondary to shock,  baseline creat 1.09 02/27/12 Plan: back to baseline, d/c ACEI  GASTROINTESTINAL No results found for this basename: AST:5,ALT:5,ALKPHOS:5,BILITOT:5,PROT:5,ALBUMIN:5 in the last 168 hours  A: ? Gastroenteritis vs PMC resolved  HEMATOLOGIC  Lab 03/18/12 0343 03/17/12 0530 03/16/12 1755 03/16/12 1320  HGB 8.2* 8.7* 8.6* 10.4*  HCT 25.4* 27.0* 26.7* 32.9*  PLT 174 173 186 226  INR 2.59* -- 2.79* --  APTT -- -- 65* --   A:  On coumadin, fall in Hb, ? dilution P  Await guiaic  INFECTIOUS  Lab 03/18/12 0343 03/17/12 0530 03/16/12 1755 03/16/12 1400 03/16/12 1320  WBC 14.5* 14.4* 18.9* -- 22.8*  PROCALCITON -- -- -- 2.26 --   Cultures: Urine 9/17>>ng Blood x 2 9/17 ng>>>  Antibiotics: Vanc 9/17 > ER only Levaquin 9/17 > ER only Flagyl 9/17 >>9/19 Maxepime 9/17 >>9/19  A: Possible pmc in setting of abx 3 weeks pta for uti P:   dc flagyl, maxepime  Change to PO levaquin x 5 more ds  for uti /LLL atx & GI - may have to rechk INR at [facility]  ENDOCRINE No results found for this basename: GLUCAP:5 in the last 168 hours A:  On prenisone chronically so likely adrenally insufficient P  Hydrocortisone, stress doses given - changed back to PO pred  NEUROLOGIC  A: s/p L lacunar cva  11/2101   PCCM to sign off - she sees [place] [facility] -dr Glade Lloyd at [facility] Resume home meds Chk satn on RA- OK to dc back to [facility]  ALVA,RAKESH V.  [phone]

## 2012-03-19 NOTE — Progress Notes (Signed)
Awaiting medical clearance for d/c back to [facility]- noted PCCM signing off- will f/u with Dr Suanne Marker. Reece Levy, MSW, Theresia Majors [phone]

## 2012-03-19 NOTE — Progress Notes (Signed)
Patient for d/c today to SNF bed at  [facility] as prior to admit. Patient agreeable to this plan- plan transfer via EMS. Reece Levy, MSW, Theresia Majors [phone]

## 2012-03-19 NOTE — Evaluation (Signed)
Physical Therapy Evaluation Patient Details Name: [name] MRN: [id number] DOB: 04-13-1930 Today's Date: 03/19/2012 Time: 4098-1191 PT Time Calculation (min): 24 min  PT Assessment / Plan / Recommendation Clinical Impression  78 yo female with history of CVA's admitted to  [facility] with weakness.  She now has deconditioning.  She will benefit from continued PT at SNF to improve strength and functional mobility and decrease burden of care    PT Assessment  Patient needs continued PT services    Follow Up Recommendations  Skilled nursing facility    Barriers to Discharge        Equipment Recommendations  Defer to next venue    Recommendations for Other Services     Frequency Min 3X/week    Precautions / Restrictions Precautions Precautions: Fall Precaution Comments: pt reports she has had 2 strokes Restrictions Weight Bearing Restrictions: No   Pertinent Vitals/Pain No c/o pain. Nasal O2 maintained      Mobility  Bed Mobility Bed Mobility: Rolling Right;Rolling Left;Supine to Sit Rolling Right: 3: Mod assist Rolling Left: 3: Mod assist Supine to Sit: 3: Mod assist Details for Bed Mobility Assistance: needs assist to complete supine to sit Transfers Transfers: Sit to Stand;Stand to Sit Sit to Stand: 1: +2 Total assist Sit to Stand: Patient Percentage: 40% Stand to Sit: 1: +2 Total assist Stand to Sit: Patient Percentage: 40% Details for Transfer Assistance:  pt has difficulty extending all the way up to get hips underneath her in standing Ambulation/Gait Ambulation Distance (Feet): 2 Feet Assistive device: Rolling walker Ambulation/Gait Assistance Details: pt needs assist to weight shift at pelvis to be able to step and move to chair Gait Pattern: Trunk flexed;Step-to pattern;Decreased step length - right;Decreased step length - left;Shuffle Gait velocity: decreased General Gait Details: very limited in gait Stairs: No Wheelchair Mobility Wheelchair Mobility: No     Exercises     PT Diagnosis: Difficulty walking;Generalized weakness  PT Problem List: Decreased strength;Decreased activity tolerance;Decreased balance;Decreased mobility;Decreased safety awareness PT Treatment Interventions: DME instruction;Gait training;Functional mobility training;Therapeutic activities   PT Goals Acute Rehab PT Goals PT Goal Formulation: With patient Time For Goal Achievement: 04/02/12 Potential to Achieve Goals: Good Pt will go Supine/Side to Sit: with min assist PT Goal: Supine/Side to Sit - Progress: Goal set today Pt will go Sit to Supine/Side: with min assist PT Goal: Sit to Supine/Side - Progress: Goal set today Pt will go Sit to Stand: with min assist PT Goal: Sit to Stand - Progress: Goal set today Pt will go Stand to Sit: with min assist PT Goal: Stand to Sit - Progress: Goal set today Pt will Transfer Bed to Chair/Chair to Bed: with min assist PT Transfer Goal: Bed to Chair/Chair to Bed - Progress: Goal set today Pt will Stand: with supervision;6 - 10 min;with bilateral upper extremity support PT Goal: Stand - Progress: Goal set today  Visit Information  Last PT Received On: 03/19/12 Assistance Needed: +2    Subjective Data  Subjective: "I don't know if I can do that" Patient Stated Goal: none stated   Prior Functioning  Home Living Available Help at Discharge: Skilled Nursing Facility Home Adaptive Equipment: Walker - rolling Prior Function Level of Independence: Needs assistance (pt states she used a RW, but did not state how much assist ) Communication Communication: No difficulties    Cognition  Overall Cognitive Status: Appears within functional limits for tasks assessed/performed Arousal/Alertness: Awake/alert Behavior During Session: [facility] for tasks performed Cognition - Other Comments:  pt repsonded to encouragement and was able to do more mobility than she initially thought she could    Extremity/Trunk Assessment Right Lower  Extremity Assessment RLE ROM/Strength/Tone: Deficits RLE ROM/Strength/Tone Deficits: pt with muscular atrophy, lackes final degrees of knee extension Left Lower Extremity Assessment LLE ROM/Strength/Tone: Deficits LLE ROM/Strength/Tone Deficits: muscular atrophy, lacks final degrees of extension    Balance Balance Balance Assessed: Yes Static Sitting Balance Static Sitting - Balance Support: Bilateral upper extremity supported Static Sitting - Level of Assistance: 5: Stand by assistance Static Sitting - Comment/# of Minutes: 5 Static Standing Balance Static Standing - Balance Support: Bilateral upper extremity supported;During functional activity Static Standing - Level of Assistance: 1: +2 Total assist;Other (comment) (pt ~ 50%) Static Standing - Comment/# of Minutes: 3  End of Session PT - End of Session Equipment Utilized During Treatment: Gait belt Activity Tolerance: Patient tolerated treatment well Patient left: in chair;with call bell/phone within reach Nurse Communication: Need for lift equipment  GP    Sandford Diop K. Manson Passey, [place] [phone] 03/19/2012, 1:29 PM

## 2012-03-19 NOTE — Progress Notes (Signed)
Pt  D/C back to [facility] . Alert and oriented. No new complains. Pt is Stable and vital signs are within range for pts normal values. pts sats 96% without oxygen and 100. On 2 l

## 2012-03-19 NOTE — Discharge Summary (Signed)
Physician Discharge Summary  [name] ZOX:[id number] DOB: 1930-02-06 DOA: 03/16/2012  PCP: Minda Meo, MD  Admit date: 03/16/2012 Discharge date: 03/19/2012  Recommendations for Outpatient Follow-up:      Follow-up Information    Please follow up. (SNF MD in 1-2 days)         Followup labs: Patient to have PT/INR done in a.m. 9/21 Discharge Diagnoses:  Active Problems:  Renal insufficiency  RA (rheumatoid arthritis)  Adrenal insufficiency  Shock  Dehydration   Discharge Condition: Improved/stable  Diet recommendation: 2 g sodium  Filed Weights   03/17/12 1401 03/18/12 0515 03/19/12 0548  Weight: 76.8 kg (169 lb 5 oz) 77.202 kg (170 lb 3.2 oz) 77.565 kg (171 lb)    History of present illness:  Pt is an 78 yo wf with steroid dependent RA, s/p L hem cva 11/2011 > NHP with persistent weakness, memory issues, slurred speech on multple antihypertensives presented to [facility] er pm 9/17 with several days n and v and d but no chest or abd pain with elevated procalcitonin and ? pna on pcxr but ct abd just showed atx and lactate level only 1.9 so PCCM service asked to evaluate for persistent hypotension after 1.5 liter fluid rx.  Sleeping ok without nocturnal or early am exacerbation of respiratory c/o's or need for noct saba. Also denies any obvious fluctuation of symptoms with weather or environmental changes or other aggravating or alleviating factors except as outlined above    Hospital Course:  1. Acute renal failure secondary to shock, baseline creat 1.09 02/27/12  -Upon admission patient was hydrated/fluid resuscitated and also her ACE inhibitor /HCTZ was held in the hospital. -With these interventions her renal function normalized-creatinine 1.09 with a BUN of 30 her to discharge -Her outpatient meds/ACE inhibitors have been resumed upon discharge and nursing home M.D. to monitor renal function and if creatinine beginning to worsen again the ACE inhibitors/HCTZ will need  to be switched to alternative meds started for optimal blood pressure control.  2. ? Gastroenteritis vs PMC  -Likely gastroenteritis as this resolved with supportive care. She was empirically covered with flagyl for possible pseudomembranous colitis as it was noted had been on antibiotics for UTI prior to admission, but she did not have any further diarrhea and so no samples were sent, and thus no  C. difficile documented and the Flagyl was discontinued  3. probable pneumonia, left lower lobe infiltrate -Per chest x-ray. She was treated with empiric antibiotics while in the hospital. Blood cultures to date show no growth. She is asymptomatic at this time and she'll be discharged on oral Levaquin for 5 more days. Her last white cell count prior to discharge is 14.5-the patient has been on chronic steroids which is likely contributing to the leukocytosis. 4.Shock -Secondary to above, blood and urine options were obtained upon admission and patient treated with appropriate broad-spectrum antibiotics per CCM. She was also aggressively fluid resuscitated and her hypotension resolved. With Cultures remaining negative and patient improving clinically her antibiotics where he escalated and as above should be discharged on oral Levaquin. She has remained afebrile and hemodynamically stable, and is to follow up outpatient upon discharge.  5. On prenisone chronically so likely adrenally insufficient  Was placed on stress dose Hydrocortisone while in the hospital. As she improved medically she was changed back to pred  which she is to continue upon discharge.    6. s/p L lacunar cva 11/2011 -Patient's INR remained therapeutic during this hospital stay-last INR  2.59 on 9/19. She is to have PT/INR done at the nursing facility in a.m. 9/21 and her Coumadin to further dose per nursing home M.D.  Procedures:  none  Consultations:  The patient was on the critical-care service from 9/17 through 9/19  Discharge  Exam: Filed Vitals:   03/18/12 1419 03/18/12 2140 03/18/12 2316 03/19/12 0548  BP: 160/73 181/88 182/102 159/86  Pulse: 77 88  84  Temp: 98.1 F (36.7 C) 98.6 F (37 C)  97.9 F (36.6 C)  TempSrc: Oral Oral  Oral  Resp: 18 18  16   Height:      Weight:    77.565 kg (171 lb)  SpO2: 100% 100%  94%   Physical Examination:  General: Pale chronically not acutely ill  Neuro: Alert, oriented x 3, non focal  Neck: Supple, no adenopathy  Cardiovascular: RRR normal S1-S2  Lungs: decreased air movement, no crackles,no wheezes  Abdomen: Mildly distended, soft, +bs  Extremities: No cyanosis and no edema Skin: Dry/pale/warm   Discharge Instructions  Discharge Orders    Future Orders Please Complete By Expires   Diet - low sodium heart healthy      Increase activity slowly      Discharge instructions      Comments:   PT/INR in am 9/21 and further coumadin dosing per SNF MD       Medication List     As of 03/19/2012 12:58 PM    TAKE these medications         acetaminophen 500 MG tablet   Commonly known as: TYLENOL   Take 500 mg by mouth every 6 (six) hours as needed. For pain      aspirin 81 MG chewable tablet   Chew 81 mg by mouth daily.      diazepam 5 MG tablet   Commonly known as: VALIUM   Take 5 mg by mouth at bedtime.      esomeprazole 40 MG capsule   Commonly known as: NEXIUM   Take 40 mg by mouth daily before breakfast.      Fish Oil 1200 MG Caps   Take 1,200 mg by mouth daily.      folic acid 1 MG tablet   Commonly known as: FOLVITE   Take 1 mg by mouth daily.      HYDROcodone-acetaminophen 5-500 MG per tablet   Commonly known as: VICODIN   Take 1 tablet by mouth every 6 (six) hours as needed. Pain      levofloxacin 750 MG tablet   Commonly known as: LEVAQUIN   Take 1 tablet (750 mg total) by mouth every other day.      levothyroxine 50 MCG tablet   Commonly known as: SYNTHROID, LEVOTHROID   Take 50 mcg by mouth daily.       lisinopril-hydrochlorothiazide 20-12.5 MG per tablet   Commonly known as: PRINZIDE,ZESTORETIC   Take 1 tablet by mouth daily.      metoprolol 50 MG tablet   Commonly known as: LOPRESSOR   Take 50 mg by mouth 2 (two) times daily.      multivitamin tablet   Take 1 tablet by mouth daily.      potassium chloride SA 15 MEQ tablet   Commonly known as: KLOR-CON M15   Take 2 tablets (30 mEq total) by mouth daily.      predniSONE 10 MG tablet   Commonly known as: DELTASONE   Take 10 mg by mouth daily.  rosuvastatin 20 MG tablet   Commonly known as: CRESTOR   Take 20 mg by mouth daily.      sertraline 50 MG tablet   Commonly known as: ZOLOFT   Take 50 mg by mouth daily.      VITAMIN C PO   Take 500 mg by mouth daily.      Vitamin D-3 5000 UNITS Tabs   Take 2 capsules by mouth daily before supper.      warfarin 1 MG tablet   Commonly known as: COUMADIN   Take 1.5 tablets (1.5 mg total) by mouth daily. Take 1.5 tablet (1.5 mg total) daily           Follow-up Information    Please follow up. (SNF MD in 1-2 days)           The results of significant diagnostics from this hospitalization (including imaging, microbiology, ancillary and laboratory) are listed below for reference.    Significant Diagnostic Studies: Ct Abdomen Pelvis Wo Contrast  03/16/2012  *RADIOLOGY REPORT*  Clinical Data: Weakness.  Left shoulder and neck pain.  Urinary tract infection.  Fever.  Vomiting.  Abdominal pain.  Renal insufficiency.  CT ABDOMEN AND PELVIS WITHOUT CONTRAST  Technique:  Multidetector CT imaging of the abdomen and pelvis was performed following the standard protocol without intravenous contrast.  Comparison: Multiple exams, including 12/06/2011 and 06/09/2004  Findings: Posterolateral thoracolumbar spinal rod fixation with thoracic laminar hooks and lumbar pedicle screws noted.  There is chronic lucency around the L5 pedicle screws suggesting loosening or infection.  T9, T10, and T12  compression fractures appear old and similar to lumbar spine radiographs from 12/11/2009.  A large hiatal hernia is present.  Mild to moderate atelectasis noted medially in the lower lobes.  Coronary artery atherosclerotic calcification noted along with aortic valve calcification.  The visualized portion of the liver, spleen, pancreas, and adrenal glands appear unremarkable in noncontrast CT appearance.  The gallbladder and biliary system appear unremarkable.  The kidneys appear unremarkable, as do the proximal ureters.  Aortoiliac atherosclerotic calcification noted.  Levoconvex lumbar scoliosis noted with rotary component.  Appendix poorly seen.  Scattered colonic diverticula are observed.  Bone graft harvesting site from the right iliac bone noted.  A Foley catheter is coiled in the urinary bladder with the balloon inflated in the urinary bladder.  Urinary bladder otherwise in the. Nitrogen gas phenomenon noted in the sacroiliac joints.  There is been some bony remodeling along the right pubic ramus fractures and pubic body fracture indicating interval healing response compared to 2005.  No abnormal fluid collection in the vicinity of the urinary bladder observed.  No free pelvic fluid identified  Degenerative arthropathy of the hips noted.  Uterus absent.  No adnexal lesion noted.  IMPRESSION:  1.  Large hiatal hernia. 2.  Mild atelectasis in both lung bases. 3.  Atherosclerosis.  4.  Interval healing response along the remote pelvic fractures compared to the 2005 examination. 5.  Chronic lucency around the L5 pedicle screws suggesting loosening or infection. 6.  Old compression fractures at T9, T10, and T12. 7.  Lumbar scoliosis with rotary component. 8.  Degenerative arthropathy of the hips.   Original Report Authenticated By: Dellia Cloud, M.D.    Dg Chest 2 View  03/19/2012  *RADIOLOGY REPORT*  Clinical Data: Sepsis, fever.  History of stroke and weakness on the left side.  CHEST - 2 VIEW   Comparison:   03/18/2012  Findings: The patient has had  prior vertebral fusion.  Heart is enlarged.  There is dense opacity at the left lung base consistent with infiltrate.  Bilateral small effusions are suspected.  There has been some improvement in overall aeration since previous exam.  IMPRESSION: Persistent left lower lobe infiltrate and probable bilateral pleural effusions.  Question of aspiration.   Original Report Authenticated By: Patterson Hammersmith, M.D.    Dg Chest 2 View  02/27/2012  *RADIOLOGY REPORT*  Clinical Data: Cough.  History of DVT.  Anemia.  CHEST - 2 VIEW  Comparison: Portable chest x-ray 12/07/2011.  Two-view chest x-ray 12/01/2011, 12/03/2007.  Findings: Suboptimal inspiration due to body habitus which accounts for crowded bronchovascular markings at the bases and accentuates the cardiac silhouette.  Taking this into account, cardiac silhouette mildly enlarged but stable.  Thoracic aorta atherosclerotic and ectatic, unchanged from the examination 1 month ago but increased in caliber since 2009.  Large hiatal hernia, also increased since 2009.  Linear atelectasis in the inferior right upper lobe.  Lungs otherwise clear.  No pleural effusion. Thoracolumbar Harrington rod fusion with generalized osteopenia and old compression fractures of 2 adjacent lower thoracic vertebrae.  IMPRESSION: Suboptimal inspiration.  Stable mild cardiomegaly without pulmonary edema.  Minimal linear atelectasis in the right upper lobe.  No acute cardiopulmonary disease otherwise.   Original Report Authenticated By: Arnell Sieving, M.D.    Dg Chest Port 1 View  03/18/2012  *RADIOLOGY REPORT*  Clinical Data: Atelectasis.  PORTABLE CHEST - 1 VIEW  Comparison: Chest 03/16/2012 and 03/17/2012.  Findings: Left basilar airspace disease has worsened.  Right lung remains clear.  Lung volumes are low.  No pneumothorax.  Heart size normal.  IMPRESSION: Worsened left basilar airspace disease could be due to atelectasis  or pneumonia.   Original Report Authenticated By: Bernadene Bell. Maricela Curet, M.D.    Dg Chest Port 1 View  03/17/2012  *RADIOLOGY REPORT*  Clinical Data: Intubation, atelectasis  PORTABLE CHEST - 1 VIEW  Comparison: Portable chest x-ray of 03/16/2012  Findings: No endotracheal tube is visible.  The lungs remain poorly aerated with probable atelectasis and pulmonary vascular congestion.  The heart is mildly enlarged.  Harrington rods are noted throughout the thoracic spine.  IMPRESSION: Poor aeration.  Probable atelectasis and mild pulmonary vascular congestion.   Original Report Authenticated By: Juline Patch, M.D.    Dg Chest Port 1 View  03/16/2012  *RADIOLOGY REPORT*  Clinical Data: Chest pain, fever.  PORTABLE CHEST - 1 VIEW  Comparison: 02/27/2012  Findings: Hypoaeration.  Hiatal hernia.  Heart size upper normal. Mild left lung base opacity is likely atelectasis.  Interstitial and vascular crowding. Mild right paratracheal opacity is likely vasculature.  No pneumothorax.  No definite pleural effusion. Multilevel degenerative change.  Fusion hardware throughout the thoracic vertebrae.  IMPRESSION: Mild left lung base opacity; atelectasis (favored) versus infiltrate.  Hiatal hernia.   Original Report Authenticated By: Waneta Martins, M.D.     Microbiology: Recent Results (from the past 240 hour(s))  CULTURE, BLOOD (ROUTINE X 2)     Status: Normal (Preliminary result)   Collection Time   03/16/12  2:00 PM      Component Value Range Status Comment   Specimen Description BLOOD LEFT ARM   Final    Special Requests BOTTLES DRAWN AEROBIC AND ANAEROBIC   Final    Culture  Setup Time 03/17/2012 01:08   Final    Culture     Final    Value:  BLOOD CULTURE RECEIVED NO GROWTH TO DATE CULTURE WILL BE HELD FOR 5 DAYS BEFORE ISSUING A FINAL NEGATIVE REPORT   Report Status PENDING   Incomplete   CULTURE, BLOOD (ROUTINE X 2)     Status: Normal (Preliminary result)   Collection Time   03/16/12  2:05  PM      Component Value Range Status Comment   Specimen Description BLOOD LEFT ARM   Final    Special Requests BOTTLES DRAWN AEROBIC ONLY   Final    Culture  Setup Time 03/17/2012 01:08   Final    Culture     Final    Value:        BLOOD CULTURE RECEIVED NO GROWTH TO DATE CULTURE WILL BE HELD FOR 5 DAYS BEFORE ISSUING A FINAL NEGATIVE REPORT   Report Status PENDING   Incomplete   URINE CULTURE     Status: Normal   Collection Time   03/16/12  2:26 PM      Component Value Range Status Comment   Specimen Description URINE, CATHETERIZED   Final    Special Requests NONE   Final    Culture  Setup Time 03/17/2012 01:17   Final    Colony Count NO GROWTH   Final    Culture NO GROWTH   Final    Report Status 03/18/2012 FINAL   Final   MRSA PCR SCREENING     Status: Normal   Collection Time   03/16/12  6:56 PM      Component Value Range Status Comment   MRSA by PCR NEGATIVE  NEGATIVE Final      Labs: Basic Metabolic Panel:  Lab 03/18/12 4696 03/17/12 0530 03/16/12 1320  NA 141 140 137  K 3.5 4.2 4.6  CL 109 108 97  CO2 24 23 28   GLUCOSE 95 135* 153*  BUN 30* 32* 44*  CREATININE 1.09 1.19* 1.51*  CALCIUM 8.3* 8.1* 9.9  MG 1.7 1.7 --  PHOS 2.9 2.6 --   Liver Function Tests: No results found for this basename: AST:5,ALT:5,ALKPHOS:5,BILITOT:5,PROT:5,ALBUMIN:5 in the last 168 hours No results found for this basename: LIPASE:5,AMYLASE:5 in the last 168 hours No results found for this basename: AMMONIA:5 in the last 168 hours CBC:  Lab 03/18/12 0343 03/17/12 0530 03/16/12 1755 03/16/12 1320  WBC 14.5* 14.4* 18.9* 22.8*  NEUTROABS -- -- 17.2* 20.7*  HGB 8.2* 8.7* 8.6* 10.4*  HCT 25.4* 27.0* 26.7* 32.9*  MCV 88.2 88.8 88.7 88.0  PLT 174 173 186 226   Cardiac Enzymes:  Lab 03/17/12 0530 03/16/12 2352 03/16/12 1755  CKTOTAL -- -- --  CKMB -- -- --  CKMBINDEX -- -- --  TROPONINI <0.30 <0.30 <0.30   BNP: BNP (last 3 results)  Basename 03/18/12 0400 03/17/12 0530  PROBNP  3700.0* 1429.0*   CBG: No results found for this basename: GLUCAP:5 in the last 168 hours  Time coordinating discharge: >30 minutes  Signed:  Hailea Eaglin C  [facility] 03/19/2012, 12:58 PM

## 2012-03-22 DIAGNOSIS — E876 Hypokalemia: Secondary | ICD-10-CM | POA: Diagnosis not present

## 2012-03-22 DIAGNOSIS — R269 Unspecified abnormalities of gait and mobility: Secondary | ICD-10-CM | POA: Diagnosis not present

## 2012-03-22 DIAGNOSIS — N390 Urinary tract infection, site not specified: Secondary | ICD-10-CM | POA: Diagnosis not present

## 2012-03-22 DIAGNOSIS — R110 Nausea: Secondary | ICD-10-CM | POA: Diagnosis not present

## 2012-03-22 DIAGNOSIS — F329 Major depressive disorder, single episode, unspecified: Secondary | ICD-10-CM | POA: Diagnosis not present

## 2012-03-22 DIAGNOSIS — M6281 Muscle weakness (generalized): Secondary | ICD-10-CM | POA: Diagnosis not present

## 2012-03-22 DIAGNOSIS — I6990 Unspecified sequelae of unspecified cerebrovascular disease: Secondary | ICD-10-CM | POA: Diagnosis not present

## 2012-03-23 DIAGNOSIS — R269 Unspecified abnormalities of gait and mobility: Secondary | ICD-10-CM | POA: Diagnosis not present

## 2012-03-23 DIAGNOSIS — M6281 Muscle weakness (generalized): Secondary | ICD-10-CM | POA: Diagnosis not present

## 2012-03-23 LAB — CULTURE, BLOOD (ROUTINE X 2)
Culture: NO GROWTH
Culture: NO GROWTH

## 2012-03-24 DIAGNOSIS — R269 Unspecified abnormalities of gait and mobility: Secondary | ICD-10-CM | POA: Diagnosis not present

## 2012-03-24 DIAGNOSIS — M6281 Muscle weakness (generalized): Secondary | ICD-10-CM | POA: Diagnosis not present

## 2012-03-25 DIAGNOSIS — R269 Unspecified abnormalities of gait and mobility: Secondary | ICD-10-CM | POA: Diagnosis not present

## 2012-03-25 DIAGNOSIS — M6281 Muscle weakness (generalized): Secondary | ICD-10-CM | POA: Diagnosis not present

## 2012-03-26 DIAGNOSIS — R269 Unspecified abnormalities of gait and mobility: Secondary | ICD-10-CM | POA: Diagnosis not present

## 2012-03-26 DIAGNOSIS — M6281 Muscle weakness (generalized): Secondary | ICD-10-CM | POA: Diagnosis not present

## 2012-03-29 DIAGNOSIS — R269 Unspecified abnormalities of gait and mobility: Secondary | ICD-10-CM | POA: Diagnosis not present

## 2012-03-29 DIAGNOSIS — M6281 Muscle weakness (generalized): Secondary | ICD-10-CM | POA: Diagnosis not present

## 2012-03-30 DIAGNOSIS — F329 Major depressive disorder, single episode, unspecified: Secondary | ICD-10-CM | POA: Diagnosis not present

## 2012-03-30 DIAGNOSIS — R269 Unspecified abnormalities of gait and mobility: Secondary | ICD-10-CM | POA: Diagnosis not present

## 2012-03-30 DIAGNOSIS — F411 Generalized anxiety disorder: Secondary | ICD-10-CM | POA: Diagnosis not present

## 2012-03-30 DIAGNOSIS — M6281 Muscle weakness (generalized): Secondary | ICD-10-CM | POA: Diagnosis not present

## 2012-03-31 ENCOUNTER — Emergency Department (HOSPITAL_COMMUNITY): Payer: Medicare Other

## 2012-03-31 ENCOUNTER — Inpatient Hospital Stay (HOSPITAL_COMMUNITY)
Admission: EM | Admit: 2012-03-31 | Discharge: 2012-04-05 | DRG: 871 | Disposition: A | Discharge status: Skilled Nursing Facility | Payer: Medicare Other | Attending: Family Medicine | Admitting: Family Medicine

## 2012-03-31 ENCOUNTER — Inpatient Hospital Stay (HOSPITAL_COMMUNITY): Payer: Medicare Other

## 2012-03-31 ENCOUNTER — Encounter (HOSPITAL_COMMUNITY): Payer: Self-pay | Admitting: Emergency Medicine

## 2012-03-31 DIAGNOSIS — Z7901 Long term (current) use of anticoagulants: Secondary | ICD-10-CM | POA: Diagnosis not present

## 2012-03-31 DIAGNOSIS — R6521 Severe sepsis with septic shock: Secondary | ICD-10-CM | POA: Diagnosis present

## 2012-03-31 DIAGNOSIS — R1110 Vomiting, unspecified: Secondary | ICD-10-CM | POA: Diagnosis not present

## 2012-03-31 DIAGNOSIS — F3289 Other specified depressive episodes: Secondary | ICD-10-CM | POA: Diagnosis present

## 2012-03-31 DIAGNOSIS — A419 Sepsis, unspecified organism: Principal | ICD-10-CM

## 2012-03-31 DIAGNOSIS — R0602 Shortness of breath: Secondary | ICD-10-CM | POA: Diagnosis not present

## 2012-03-31 DIAGNOSIS — IMO0002 Reserved for concepts with insufficient information to code with codable children: Secondary | ICD-10-CM

## 2012-03-31 DIAGNOSIS — N289 Disorder of kidney and ureter, unspecified: Secondary | ICD-10-CM | POA: Diagnosis present

## 2012-03-31 DIAGNOSIS — E869 Volume depletion, unspecified: Secondary | ICD-10-CM | POA: Diagnosis present

## 2012-03-31 DIAGNOSIS — R2681 Unsteadiness on feet: Secondary | ICD-10-CM

## 2012-03-31 DIAGNOSIS — K219 Gastro-esophageal reflux disease without esophagitis: Secondary | ICD-10-CM

## 2012-03-31 DIAGNOSIS — R7989 Other specified abnormal findings of blood chemistry: Secondary | ICD-10-CM

## 2012-03-31 DIAGNOSIS — I129 Hypertensive chronic kidney disease with stage 1 through stage 4 chronic kidney disease, or unspecified chronic kidney disease: Secondary | ICD-10-CM | POA: Diagnosis present

## 2012-03-31 DIAGNOSIS — J189 Pneumonia, unspecified organism: Secondary | ICD-10-CM

## 2012-03-31 DIAGNOSIS — F32A Depression, unspecified: Secondary | ICD-10-CM

## 2012-03-31 DIAGNOSIS — Z888 Allergy status to other drugs, medicaments and biological substances status: Secondary | ICD-10-CM

## 2012-03-31 DIAGNOSIS — H9201 Otalgia, right ear: Secondary | ICD-10-CM

## 2012-03-31 DIAGNOSIS — D72829 Elevated white blood cell count, unspecified: Secondary | ICD-10-CM | POA: Diagnosis present

## 2012-03-31 DIAGNOSIS — A088 Other specified intestinal infections: Secondary | ICD-10-CM | POA: Diagnosis present

## 2012-03-31 DIAGNOSIS — E860 Dehydration: Secondary | ICD-10-CM

## 2012-03-31 DIAGNOSIS — E440 Moderate protein-calorie malnutrition: Secondary | ICD-10-CM | POA: Diagnosis not present

## 2012-03-31 DIAGNOSIS — IMO0001 Reserved for inherently not codable concepts without codable children: Secondary | ICD-10-CM | POA: Diagnosis not present

## 2012-03-31 DIAGNOSIS — I639 Cerebral infarction, unspecified: Secondary | ICD-10-CM

## 2012-03-31 DIAGNOSIS — M069 Rheumatoid arthritis, unspecified: Secondary | ICD-10-CM

## 2012-03-31 DIAGNOSIS — F411 Generalized anxiety disorder: Secondary | ICD-10-CM | POA: Diagnosis present

## 2012-03-31 DIAGNOSIS — I059 Rheumatic mitral valve disease, unspecified: Secondary | ICD-10-CM | POA: Diagnosis present

## 2012-03-31 DIAGNOSIS — E039 Hypothyroidism, unspecified: Secondary | ICD-10-CM

## 2012-03-31 DIAGNOSIS — E2749 Other adrenocortical insufficiency: Secondary | ICD-10-CM | POA: Diagnosis present

## 2012-03-31 DIAGNOSIS — R579 Shock, unspecified: Secondary | ICD-10-CM

## 2012-03-31 DIAGNOSIS — R630 Anorexia: Secondary | ICD-10-CM

## 2012-03-31 DIAGNOSIS — R1310 Dysphagia, unspecified: Secondary | ICD-10-CM

## 2012-03-31 DIAGNOSIS — M810 Age-related osteoporosis without current pathological fracture: Secondary | ICD-10-CM | POA: Diagnosis present

## 2012-03-31 DIAGNOSIS — R748 Abnormal levels of other serum enzymes: Secondary | ICD-10-CM | POA: Diagnosis not present

## 2012-03-31 DIAGNOSIS — R269 Unspecified abnormalities of gait and mobility: Secondary | ICD-10-CM | POA: Diagnosis present

## 2012-03-31 DIAGNOSIS — I2510 Atherosclerotic heart disease of native coronary artery without angina pectoris: Secondary | ICD-10-CM

## 2012-03-31 DIAGNOSIS — E785 Hyperlipidemia, unspecified: Secondary | ICD-10-CM | POA: Diagnosis present

## 2012-03-31 DIAGNOSIS — Z90710 Acquired absence of both cervix and uterus: Secondary | ICD-10-CM

## 2012-03-31 DIAGNOSIS — Z885 Allergy status to narcotic agent status: Secondary | ICD-10-CM

## 2012-03-31 DIAGNOSIS — M1990 Unspecified osteoarthritis, unspecified site: Secondary | ICD-10-CM | POA: Diagnosis present

## 2012-03-31 DIAGNOSIS — I214 Non-ST elevation (NSTEMI) myocardial infarction: Secondary | ICD-10-CM | POA: Diagnosis not present

## 2012-03-31 DIAGNOSIS — Z96659 Presence of unspecified artificial knee joint: Secondary | ICD-10-CM

## 2012-03-31 DIAGNOSIS — F419 Anxiety disorder, unspecified: Secondary | ICD-10-CM

## 2012-03-31 DIAGNOSIS — K449 Diaphragmatic hernia without obstruction or gangrene: Secondary | ICD-10-CM

## 2012-03-31 DIAGNOSIS — D638 Anemia in other chronic diseases classified elsewhere: Secondary | ICD-10-CM | POA: Diagnosis present

## 2012-03-31 DIAGNOSIS — E2740 Unspecified adrenocortical insufficiency: Secondary | ICD-10-CM

## 2012-03-31 DIAGNOSIS — G8929 Other chronic pain: Secondary | ICD-10-CM | POA: Diagnosis present

## 2012-03-31 DIAGNOSIS — I252 Old myocardial infarction: Secondary | ICD-10-CM

## 2012-03-31 DIAGNOSIS — R0902 Hypoxemia: Secondary | ICD-10-CM | POA: Diagnosis present

## 2012-03-31 DIAGNOSIS — R5383 Other fatigue: Secondary | ICD-10-CM

## 2012-03-31 DIAGNOSIS — Y9210 Unspecified residential institution as the place of occurrence of the external cause: Secondary | ICD-10-CM | POA: Diagnosis present

## 2012-03-31 DIAGNOSIS — Z8711 Personal history of peptic ulcer disease: Secondary | ICD-10-CM

## 2012-03-31 DIAGNOSIS — Z882 Allergy status to sulfonamides status: Secondary | ICD-10-CM

## 2012-03-31 DIAGNOSIS — K922 Gastrointestinal hemorrhage, unspecified: Secondary | ICD-10-CM

## 2012-03-31 DIAGNOSIS — K279 Peptic ulcer, site unspecified, unspecified as acute or chronic, without hemorrhage or perforation: Secondary | ICD-10-CM

## 2012-03-31 DIAGNOSIS — F329 Major depressive disorder, single episode, unspecified: Secondary | ICD-10-CM | POA: Diagnosis present

## 2012-03-31 DIAGNOSIS — Z86718 Personal history of other venous thrombosis and embolism: Secondary | ICD-10-CM

## 2012-03-31 DIAGNOSIS — Z79899 Other long term (current) drug therapy: Secondary | ICD-10-CM

## 2012-03-31 DIAGNOSIS — M25569 Pain in unspecified knee: Secondary | ICD-10-CM | POA: Diagnosis not present

## 2012-03-31 DIAGNOSIS — T380X5A Adverse effect of glucocorticoids and synthetic analogues, initial encounter: Secondary | ICD-10-CM | POA: Diagnosis present

## 2012-03-31 DIAGNOSIS — N183 Chronic kidney disease, stage 3 unspecified: Secondary | ICD-10-CM | POA: Diagnosis present

## 2012-03-31 DIAGNOSIS — R6520 Severe sepsis without septic shock: Secondary | ICD-10-CM

## 2012-03-31 DIAGNOSIS — Z7982 Long term (current) use of aspirin: Secondary | ICD-10-CM

## 2012-03-31 DIAGNOSIS — R110 Nausea: Secondary | ICD-10-CM | POA: Diagnosis not present

## 2012-03-31 DIAGNOSIS — J9819 Other pulmonary collapse: Secondary | ICD-10-CM | POA: Diagnosis not present

## 2012-03-31 DIAGNOSIS — I219 Acute myocardial infarction, unspecified: Secondary | ICD-10-CM

## 2012-03-31 DIAGNOSIS — Z981 Arthrodesis status: Secondary | ICD-10-CM | POA: Diagnosis not present

## 2012-03-31 DIAGNOSIS — M549 Dorsalgia, unspecified: Secondary | ICD-10-CM | POA: Diagnosis not present

## 2012-03-31 LAB — COMPREHENSIVE METABOLIC PANEL
ALT: 15 U/L (ref 0–35)
CO2: 23 mEq/L (ref 19–32)
Calcium: 9.5 mg/dL (ref 8.4–10.5)
Creatinine, Ser: 1.3 mg/dL — ABNORMAL HIGH (ref 0.50–1.10)
GFR calc Af Amer: 44 mL/min — ABNORMAL LOW (ref >90)
GFR calc non Af Amer: 38 mL/min — ABNORMAL LOW (ref >90)
Glucose, Bld: 103 mg/dL — ABNORMAL HIGH (ref 70–99)
Sodium: 135 mEq/L (ref 135–145)

## 2012-03-31 LAB — CBC WITH DIFFERENTIAL/PLATELET
Eosinophils Relative: 1 % (ref 0–5)
HCT: 32.8 % — ABNORMAL LOW (ref 36.0–46.0)
Hemoglobin: 10.6 g/dL — ABNORMAL LOW (ref 12.0–15.0)
Lymphocytes Relative: 8 % — ABNORMAL LOW (ref 12–46)
Lymphs Abs: 1.9 10*3/uL (ref 0.7–4.0)
MCV: 85 fL (ref 78.0–100.0)
Monocytes Absolute: 1.6 10*3/uL — ABNORMAL HIGH (ref 0.1–1.0)
RBC: 3.86 MIL/uL — ABNORMAL LOW (ref 3.87–5.11)
WBC: 24.1 10*3/uL — ABNORMAL HIGH (ref 4.0–10.5)

## 2012-03-31 LAB — URINALYSIS, MICROSCOPIC ONLY
Bilirubin Urine: NEGATIVE
Nitrite: NEGATIVE
Protein, ur: NEGATIVE mg/dL
Specific Gravity, Urine: 1.02 (ref 1.005–1.030)
Urobilinogen, UA: 0.2 mg/dL (ref 0.0–1.0)

## 2012-03-31 LAB — LACTIC ACID, PLASMA: Lactic Acid, Venous: 2.5 mmol/L — ABNORMAL HIGH (ref 0.5–2.2)

## 2012-03-31 LAB — PROTIME-INR: Prothrombin Time: 26.4 seconds — ABNORMAL HIGH (ref 11.6–15.2)

## 2012-03-31 MED ORDER — SODIUM CHLORIDE 0.9 % IJ SOLN
3.0000 mL | Freq: Two times a day (BID) | INTRAMUSCULAR | Status: DC
  Administered 2012-04-01 – 2012-04-05 (×8): 3 mL via INTRAVENOUS

## 2012-03-31 MED ORDER — SODIUM CHLORIDE 0.9 % IV BOLUS (SEPSIS)
1000.0000 mL | Freq: Once | INTRAVENOUS | Status: AC
  Administered 2012-03-31: 1000 mL via INTRAVENOUS

## 2012-03-31 MED ORDER — ACETAMINOPHEN 325 MG PO TABS
ORAL_TABLET | ORAL | Status: AC
  Filled 2012-03-31: qty 2

## 2012-03-31 MED ORDER — METHYLPREDNISOLONE SODIUM SUCC 125 MG IJ SOLR
125.0000 mg | Freq: Four times a day (QID) | INTRAMUSCULAR | Status: DC
  Administered 2012-03-31 – 2012-04-01 (×3): 125 mg via INTRAVENOUS
  Filled 2012-03-31 (×6): qty 2

## 2012-03-31 MED ORDER — METHYLPREDNISOLONE SODIUM SUCC 125 MG IJ SOLR
125.0000 mg | Freq: Once | INTRAMUSCULAR | Status: AC
  Administered 2012-03-31: 125 mg via INTRAVENOUS
  Filled 2012-03-31: qty 2

## 2012-03-31 MED ORDER — ACETAMINOPHEN 325 MG PO TABS
650.0000 mg | ORAL_TABLET | Freq: Once | ORAL | Status: AC
  Administered 2012-03-31: 650 mg via ORAL

## 2012-03-31 MED ORDER — WARFARIN SODIUM 1 MG PO TABS: 1.5000 mg | ORAL_TABLET | Freq: Every day | ORAL | Status: DC

## 2012-03-31 MED ORDER — VANCOMYCIN HCL IN DEXTROSE 1-5 GM/200ML-% IV SOLN
1000.0000 mg | INTRAVENOUS | Status: DC
  Administered 2012-03-31 – 2012-04-03 (×4): 1000 mg via INTRAVENOUS
  Filled 2012-03-31 (×6): qty 200

## 2012-03-31 MED ORDER — SERTRALINE HCL 50 MG PO TABS
50.0000 mg | ORAL_TABLET | Freq: Every day | ORAL | Status: DC
  Administered 2012-03-31 – 2012-04-05 (×6): 50 mg via ORAL
  Filled 2012-03-31 (×6): qty 1

## 2012-03-31 MED ORDER — PANTOPRAZOLE SODIUM 40 MG PO TBEC
40.0000 mg | DELAYED_RELEASE_TABLET | Freq: Every day | ORAL | Status: DC
  Administered 2012-03-31 – 2012-04-03 (×4): 40 mg via ORAL
  Filled 2012-03-31 (×4): qty 1

## 2012-03-31 MED ORDER — METOPROLOL TARTRATE 50 MG PO TABS
50.0000 mg | ORAL_TABLET | Freq: Two times a day (BID) | ORAL | Status: DC
  Administered 2012-04-01 – 2012-04-05 (×9): 50 mg via ORAL
  Filled 2012-03-31 (×11): qty 1

## 2012-03-31 MED ORDER — WARFARIN - PHARMACIST DOSING INPATIENT: Freq: Every day | Status: DC

## 2012-03-31 MED ORDER — PIPERACILLIN-TAZOBACTAM 3.375 G IVPB
3.3750 g | Freq: Three times a day (TID) | INTRAVENOUS | Status: DC
  Administered 2012-03-31 – 2012-04-02 (×5): 3.375 g via INTRAVENOUS
  Filled 2012-03-31 (×6): qty 50

## 2012-03-31 MED ORDER — WARFARIN SODIUM 1 MG PO TABS
1.5000 mg | ORAL_TABLET | Freq: Once | ORAL | Status: AC
  Administered 2012-03-31: 1.5 mg via ORAL
  Filled 2012-03-31: qty 1

## 2012-03-31 MED ORDER — SODIUM CHLORIDE 0.9 % IV SOLN
INTRAVENOUS | Status: DC
  Administered 2012-03-31 – 2012-04-01 (×2): via INTRAVENOUS

## 2012-03-31 MED ORDER — LEVOTHYROXINE SODIUM 50 MCG PO TABS
50.0000 ug | ORAL_TABLET | Freq: Every day | ORAL | Status: DC
  Administered 2012-04-01 – 2012-04-05 (×5): 50 ug via ORAL
  Filled 2012-03-31 (×7): qty 1

## 2012-03-31 MED ORDER — DIAZEPAM 5 MG PO TABS
5.0000 mg | ORAL_TABLET | Freq: Every day | ORAL | Status: DC
  Administered 2012-03-31 – 2012-04-04 (×5): 5 mg via ORAL
  Filled 2012-03-31 (×5): qty 1

## 2012-03-31 MED ORDER — DEXTROSE 5 % IV SOLN
1.0000 g | Freq: Once | INTRAVENOUS | Status: AC
  Administered 2012-03-31: 1 g via INTRAVENOUS
  Filled 2012-03-31: qty 10

## 2012-03-31 MED ORDER — SODIUM CHLORIDE 0.9 % IV SOLN
1000.0000 mL | Freq: Once | INTRAVENOUS | Status: AC
  Administered 2012-03-31: 1000 mL via INTRAVENOUS

## 2012-03-31 NOTE — ED Notes (Signed)
Pt stated she started feeling nauseated and vomited x3 times since around 9am this morning. At facility pt's temperature was 101.8 per EMS. Pt denies any diarrhea and denies nausea right now.

## 2012-03-31 NOTE — Progress Notes (Signed)
ANTIBIOTIC CONSULT NOTE - INITIAL  Pharmacy Consult for Vancomycin, Zosyn Indication: rule out pneumonia  Allergies  Allergen Reactions  . Codeine     sick  . Morphine And Related Other (See Comments)    unknown  . Percocet (Oxycodone-Acetaminophen)     unknown  . Plavix (Clopidogrel Bisulfate) Other (See Comments)    unknown  . Sulfur Other (See Comments)    unknown    Patient Measurements:   Adjusted Body Weight:   Vital Signs: Temp: 99.4 F (37.4 C) (10/02 1930) Temp src: Other (Comment) (10/02 1536) BP: 95/40 mmHg (10/02 1907) Pulse Rate: 99  (10/02 1930) Intake/Output from previous day:    Labs:  Basename 03/31/12 1400  WBC 24.1*  HGB 10.6*  PLT 258  LABCREA --  CREATININE 1.30*   The CrCl is unknown because both a height and weight (above a minimum accepted value) are required for this calculation. No results found for this basename: VANCOTROUGH:2,VANCOPEAK:2,VANCORANDOM:2,GENTTROUGH:2,GENTPEAK:2,GENTRANDOM:2,TOBRATROUGH:2,TOBRAPEAK:2,TOBRARND:2,AMIKACINPEAK:2,AMIKACINTROU:2,AMIKACIN:2, in the last 72 hours   Microbiology: Recent Results (from the past 720 hour(s))  CULTURE, BLOOD (ROUTINE X 2)     Status: Normal   Collection Time   03/16/12  2:00 PM      Component Value Range Status Comment   Specimen Description BLOOD LEFT ARM   Final    Special Requests BOTTLES DRAWN AEROBIC AND ANAEROBIC   Final    Culture  Setup Time 03/17/2012 01:08   Final    Culture NO GROWTH 5 DAYS   Final    Report Status 03/23/2012 FINAL   Final   CULTURE, BLOOD (ROUTINE X 2)     Status: Normal   Collection Time   03/16/12  2:05 PM      Component Value Range Status Comment   Specimen Description BLOOD LEFT ARM   Final    Special Requests BOTTLES DRAWN AEROBIC ONLY   Final    Culture  Setup Time 03/17/2012 01:08   Final    Culture NO GROWTH 5 DAYS   Final    Report Status 03/23/2012 FINAL   Final   URINE CULTURE     Status: Normal   Collection Time   03/16/12   2:26 PM      Component Value Range Status Comment   Specimen Description URINE, CATHETERIZED   Final    Special Requests NONE   Final    Culture  Setup Time 03/17/2012 01:17   Final    Colony Count NO GROWTH   Final    Culture NO GROWTH   Final    Report Status 03/18/2012 FINAL   Final   MRSA PCR SCREENING     Status: Normal   Collection Time   03/16/12  6:56 PM      Component Value Range Status Comment   MRSA by PCR NEGATIVE  NEGATIVE Final     Medical History: Past Medical History  Diagnosis Date  . SOB (shortness of breath)   . MI (myocardial infarction)   . Poor appetite   . Arthritis   . Fatigue   . Right ear pain   . Renal insufficiency   . RA (rheumatoid arthritis)   . PUD (peptic ulcer disease)   . GI bleed 2005  . GERD (gastroesophageal reflux disease)   . Hiatal hernia   . Hypothyroidism   . Osteoporosis   . Depression   . Anxiety   . Coronary artery disease   . CVA (cerebral vascular accident)   . TIA (transient  ischemic attack)   . Fall   . DVT of lower extremity (deep venous thrombosis)     Medications:  Anti-infectives     Start     Dose/Rate Route Frequency Ordered Stop   03/31/12 1515   cefTRIAXone (ROCEPHIN) 1 g in dextrose 5 % 50 mL IVPB        1 g 100 mL/hr over 30 Minutes Intravenous  Once 03/31/12 1503 03/31/12 1603         Assessment:  82 YOF admit from nursing home with suspected pneumonia  Tm 103, WBC 24.1, CrCl (n) ~ 39 ml/min  One dose of ceftriaxone given in ED  Blood and urine cultures pending  Goal of Therapy:  Vancomycin trough level 15-20 mcg/ml Appropriate Zosyn dosing per renal function  Plan:   Zosyn 3.375g IV Q8H infused over 4hrs.  Vancomycin 1g IV q24h.  Measure Vanc trough at steady state.  Follow up renal fxn and culture results.   Lynann Beaver PharmD, BCPS Pager [phone] 03/31/2012 7:40 PM

## 2012-03-31 NOTE — ED Provider Notes (Signed)
History     CSN: [id number]  Arrival date & time 03/31/12  1339   First MD Initiated Contact with Patient 03/31/12 1348      Chief Complaint  Patient presents with  . Nausea    vomiting    (Consider location/radiation/quality/duration/timing/severity/associated sxs/prior treatment) Patient is a 78 y.o. female presenting with vomiting. The history is provided by the patient (the pt states she has vomited 3 times today).  Emesis  This is a new problem. The current episode started 1 to 2 hours ago. The problem occurs 2 to 4 times per day. The problem has not changed since onset.The emesis has an appearance of stomach contents. The maximum temperature recorded prior to her arrival was 101 to 101.9 F. Associated symptoms include a fever. Pertinent negatives include no abdominal pain, no chills, no cough, no diarrhea and no headaches. Risk factors: previous uro sepsis.    Past Medical History  Diagnosis Date  . SOB (shortness of breath)   . MI (myocardial infarction)   . Poor appetite   . Arthritis   . Fatigue   . Right ear pain   . Renal insufficiency   . RA (rheumatoid arthritis)   . PUD (peptic ulcer disease)   . GI bleed 2005  . GERD (gastroesophageal reflux disease)   . Hiatal hernia   . Hypothyroidism   . Osteoporosis   . Depression   . Anxiety   . Coronary artery disease   . CVA (cerebral vascular accident)   . TIA (transient ischemic attack)   . Fall   . DVT of lower extremity (deep venous thrombosis)     Past Surgical History  Procedure Date  . Cardiac catheterization     SHOWED RUPTURE PLAQUE IN THE LAD. THE LAD IS NONOBSTRUCTIVE WITH ONLY 30-40% STENOSIS  . Nstemi 06/2010  . Spinal fusion surgery   . Knee surgery     Family History  Problem Relation Age of Onset  . Kidney disease Mother   . Kidney disease Brother     History  Substance Use Topics  . Smoking status: Never Smoker   . Smokeless tobacco: Never Used  . Alcohol Use: No    OB History      Grav Para Term Preterm Abortions TAB SAB Ect Mult Living                  Review of Systems  Constitutional: Positive for fever and fatigue. Negative for chills.  HENT: Negative for congestion, sinus pressure and ear discharge.   Eyes: Negative for discharge.  Respiratory: Negative for cough.   Cardiovascular: Negative for chest pain.  Gastrointestinal: Positive for vomiting. Negative for abdominal pain and diarrhea.  Genitourinary: Negative for frequency and hematuria.  Musculoskeletal: Negative for back pain.  Skin: Negative for rash.  Neurological: Negative for seizures and headaches.  Hematological: Negative.   Psychiatric/Behavioral: Negative for hallucinations.    Allergies  Codeine; Morphine and related; Percocet; Plavix; and Sulfur  Home Medications   Current Outpatient Rx  Name Route Sig Dispense Refill  . ACETAMINOPHEN 500 MG PO TABS Oral Take 500 mg by mouth every 6 (six) hours as needed. For pain    . VITAMIN C PO Oral Take 500 mg by mouth daily.     . ASPIRIN 81 MG PO CHEW Oral Chew 81 mg by mouth daily.    Marland Kitchen DIAZEPAM 5 MG PO TABS Oral Take 5 mg by mouth at bedtime.    Marland Kitchen ESOMEPRAZOLE MAGNESIUM 40  MG PO CPDR Oral Take 40 mg by mouth daily before breakfast.     . FOLIC ACID 1 MG PO TABS Oral Take 1 mg by mouth daily.     Marland Kitchen HYDROCODONE-ACETAMINOPHEN 5-500 MG PO TABS Oral Take 1 tablet by mouth every 6 (six) hours as needed. Pain 30 tablet 0  . LEVOTHYROXINE SODIUM 50 MCG PO TABS Oral Take 50 mcg by mouth daily.     Marland Kitchen LISINOPRIL-HYDROCHLOROTHIAZIDE 20-12.5 MG PO TABS Oral Take 1 tablet by mouth daily.     Marland Kitchen METOPROLOL TARTRATE 50 MG PO TABS Oral Take 50 mg by mouth 2 (two) times daily.     Marland Kitchen ONE-DAILY MULTI VITAMINS PO TABS Oral Take 1 tablet by mouth daily.      Marland Kitchen FISH OIL 1200 MG PO CAPS Oral Take 1,200 mg by mouth daily.    Marland Kitchen POTASSIUM CHLORIDE CRYS ER 15 MEQ PO TBCR Oral Take 2 tablets (30 mEq total) by mouth daily.    Marland Kitchen PREDNISONE 10 MG PO TABS Oral Take 10 mg  by mouth daily.    Marland Kitchen PROMETHAZINE HCL 25 MG PO TABS Oral Take 25 mg by mouth every 6 (six) hours as needed. nausea    . ROSUVASTATIN CALCIUM 20 MG PO TABS Oral Take 20 mg by mouth daily.     . SERTRALINE HCL 50 MG PO TABS Oral Take 50 mg by mouth daily.     . WARFARIN SODIUM 1 MG PO TABS Oral Take 1.5 tablets (1.5 mg total) by mouth daily. Take 1.5 tablet (1.5 mg total) daily      PT/INR in am 9/21 and further coumadin dosing per  ...    BP 91/43  Pulse 106  Temp 100.9 F (38.3 C) (Other (Comment))  Resp 22  SpO2 98%  Physical Exam  Constitutional: She is oriented to person, place, and time. She appears well-developed.  HENT:  Head: Normocephalic and atraumatic.  Eyes: Conjunctivae normal and EOM are normal. No scleral icterus.  Neck: Neck supple. No thyromegaly present.  Cardiovascular: Normal rate and regular rhythm.  Exam reveals no gallop and no friction rub.   No murmur heard. Pulmonary/Chest: No stridor. She has no wheezes. She has no rales. She exhibits no tenderness.  Abdominal: She exhibits no distension. There is no tenderness. There is no rebound.  Musculoskeletal: Normal range of motion. She exhibits no edema.  Lymphadenopathy:    She has no cervical adenopathy.  Neurological: She is oriented to person, place, and time.  Skin: No rash noted. No erythema.  Psychiatric: She has a normal mood and affect. Her behavior is normal.    ED Course  Procedures (including critical care time)  Labs Reviewed  CBC WITH DIFFERENTIAL - Abnormal; Notable for the following:    WBC 24.1 (*)     RBC 3.86 (*)     Hemoglobin 10.6 (*)     HCT 32.8 (*)     RDW 15.6 (*)     Neutrophils Relative 84 (*)     Neutro Abs 20.3 (*)     Lymphocytes Relative 8 (*)     Monocytes Absolute 1.6 (*)     All other components within normal limits  COMPREHENSIVE METABOLIC PANEL - Abnormal; Notable for the following:    Glucose, Bld 103 (*)     BUN 31 (*)     Creatinine, Ser 1.30 (*)     Albumin  2.8 (*)     GFR calc non Af Amer 38 (*)  GFR calc Af Amer 44 (*)     All other components within normal limits  URINALYSIS, MICROSCOPIC ONLY - Abnormal; Notable for the following:    Leukocytes, UA TRACE (*)     Squamous Epithelial / LPF FEW (*)     All other components within normal limits  LACTIC ACID, PLASMA - Abnormal; Notable for the following:    Lactic Acid, Venous 2.5 (*)     All other components within normal limits  PROTIME-INR - Abnormal; Notable for the following:    Prothrombin Time 26.4 (*)     INR 2.58 (*)     All other components within normal limits  URINE CULTURE  CULTURE, BLOOD (ROUTINE X 2)  CULTURE, BLOOD (ROUTINE X 2)  URINALYSIS, ROUTINE W REFLEX MICROSCOPIC   Dg Chest Port 1 View  03/31/2012  *RADIOLOGY REPORT*  Clinical Data: Shortness of breath.  PORTABLE CHEST - 1 VIEW  Comparison: 03/19/2012 and CT 03/16/2012  Findings: There is a stable retrocardiac density consistent with a hiatal hernia.  The patient has fusion rods within the thoracic spine.  There are persistent low lung volumes without focal disease.  Atelectasis or scarring at the left lung base.  IMPRESSION: Low lung volumes without acute findings.  Hiatal hernia and suspect chronic volume loss and atelectasis at the left lung base.   Original Report Authenticated By: Richarda Overlie, M.D.      No diagnosis found.   Date: 03/31/2012  Rate:125  Rhythm: sinus tachycardia  QRS Axis: normal  Intervals: normal  ST/T Wave abnormalities: normal  Conduction Disutrbances:none  Narrative Interpretation:   Old EKG Reviewed: none available  Dr. Jacky Kindle called and he stated the pt is not his pt and the pt belongs to [facility].      I spoke with [facility] and they will come see the pt and call critical care if needed CRITICAL CARE Performed by: Jonnette Nuon L   Total critical care time: 35  Critical care time was exclusive of separately billable procedures and treating other patients.  Critical  care was necessary to treat or prevent imminent or life-threatening deterioration.  Critical care was time spent personally by me on the following activities: development of treatment plan with patient and/or surrogate as well as nursing, discussions with consultants, evaluation of patient's response to treatment, examination of patient, obtaining history from patient or surrogate, ordering and performing treatments and interventions, ordering and review of laboratory studies, ordering and review of radiographic studies, pulse oximetry and re-evaluation of patient's condition.  MDM          Benny Lennert, MD 03/31/12 [id number]

## 2012-03-31 NOTE — ED Notes (Signed)
Per EMS pt having n/v. Pt temperature at facility 101.8,

## 2012-03-31 NOTE — H&P (Signed)
[facility] History and Physical  [name] ZOX:[id number] DOB: Aug 01, 1929 DOA: 03/31/2012  Referring physician: Estell Harpin, EDP PCP: Minda Meo, MD  Specialists: [facility]  Chief Complaint: Nausea and vomiting x 1/7  HPI: [name] is a caucasian 78 y.o. female recently admitted to [facility] hospital and d/c 03/19/2012 with potential gastroenteritis versus pseudomembranous colitis versus probable pneumonia with left lower lobe infiltrate-she claims she completed a full course of oral Levaquin-at that admission she seemed to be in septic shock with persistent hypotension and this was completely treated with broad-spectrum antibiotics and aggressive IV fluid repletion.Marland Kitchen She was evaluated by critical care and ultimately discharged by [facility] back to the nursing facility where she comes from. She repairs and did to the emergency room today from her nursing facility-documented that she had a temperature of 101.8, and experienced some N/V x 2 this am and was sen tover to the ED for further evaluation- [name] RN reports she had a large Emesis, non-bloody (?) prior to breakfast with temp Night nurse charted no issues overnight 03/30/2012 She developed a DVT at [facility] and is on coumadin therapy 1.5 mg daily Her other vitals at [facility] per documentation P=130, Sp02 90%, BP 114/55-she received only tylenol 1000 mg and Phenergan 25 po and all of her other medications were held Patient is somewhat sleepy but can give some-she claims that she only had 2 episodes of vomiting and fever. She denies any specific nausea at present does state that she's had maybe some mild burning in her urine. She denies any cough or cold or fever. She denies any dark stool or his stool dark vomitus or other issues. She can relay clearly to me that she is at [facility], this October, this is fall, this is 2013 the president is Obama.  Emergency room workup revealed BUN to  creatinine ratio 31/1.30, albumin 2.8, lactic acid 2.5 Leukocytosis of 24,000, hemoglobin 10.6 RDW 15.6 predominant neutrophilia 84% Portable chest x-ray = low lung volumes without acute findings, hiatal hernia and suspect chronic volume loss and atelectasis at left lung base INR 2.58 Urinalysis = diffuse  epithelial cells trace leukocytes (cath specimen)  Review of Systems:  Negative except as above-she categorically denies any cough any cold any fever any dark stool any tarry stool any blurred vision or double vision any weakness subsided ordinary, she does have some positive pain in the lower back, no blurred vision no double vision no sweats or chills  Past Medical History  Diagnosis Date  . SOB (shortness of breath)   . MI (myocardial infarction)   . Poor appetite   . Arthritis   . Fatigue   . Right ear pain   . Renal insufficiency   . RA (rheumatoid arthritis)   . PUD (peptic ulcer disease)   . GI bleed 2005  . GERD (gastroesophageal reflux disease)   . Hiatal hernia   . Hypothyroidism   . Osteoporosis   . Depression   . Anxiety   . Coronary artery disease   . CVA (cerebral vascular accident)   . TIA (transient ischemic attack)   . Fall   . DVT of lower extremity (deep venous thrombosis)    Chart review  Rheumatoid arthritis diagnosed at age 46  History of right total knee arthroplasty 10/08/2000  Reported history of 3 breast biopsies  Reported history of remote CVA which affected right side of her brain left side of her body and was managed by  Dr. Anne Hahn for this unclear as to when this occurred  Hysterectomy 1980, rectocele repair 1980  Complicated back surgery and fusion done in the past at [facility]  Admission 09/09/2003 for symptomatic normocytic anemia-EGD done at that time showed prepyloric antral ulcer in with no visible vessel-she was scoped by Dr. Evette Cristal at that time of [facility]  Admission 01/23/2004 for right subpatellar arthritis-had surgery  at that time  Admission 09/17/2004 with vertigo secondary to possible labyrinthitis with no evidence of CNS event  Admission 01/23/2007 and with nausea and vomiting secondary to adrenal insufficiency and crisis and gastroenteritis precipitating adrenal crisis with steroid-dependent rheumatoid arthritis   admitted 08/19/2007 for a left total knee arthroplasty  Admitted 08/27/2007 Enterobacter UTI and sepsis with abnormal liver function at that time   Admitted 07/06/2010 with non-ST segment elevation MI and nonobstructive ruptured plaque in the LAD-she was not candidate for stenting and has a plaque was nonobstructive given the fact that she had a high risk for recurrent GI bleed in setting of dual antiplatelet therapy it was decided that cardiology at that time to continue medical therapy and she is treated with aspirin and presented initially   Admission 05/10/2011 with infrascapular back pain-she underwent lexis and Cardiolite study showing no evidence of dysfunction   Admission 12/01/2011 with acute left subcortical lacunar infarct corpus callosum with dysarthria-at that time she had carotid Dopplers which were nonrevealing normal echocardiogram MRI showing what was noted earlier her aspirin is advanced to full dose 325 mg given she has poor tolerance to Plavix-she also had another MRI during that admission showing new A. of restricted diffusion in most posterior left medial frontal lobe felt to call for weakness of right extremity and stable infarct corpus callosum left singulate gyrus  Last admission was 03/16/2012 for nausea vomiting and septic shock and she was admitted by critical care medicine at that time    Past Surgical History  Procedure Date  . Cardiac catheterization     SHOWED RUPTURE PLAQUE IN THE LAD. THE LAD IS NONOBSTRUCTIVE WITH ONLY 30-40% STENOSIS  . Nstemi 06/2010  . Spinal fusion surgery   . Knee surgery    Social History:  reports that she has never smoked. She has  never used smokeless tobacco. She reports that she does not drink alcohol or use illicit drugs. History   Social History Narrative   Patient used to work at [employer] for 25 years until she retiredShe lives by herself in [place] until she had a stroke earlier in 2013 JuneHer next of kin is her daughterShe does get physical therapy was everyday at [facility] nursing home    Allergies  Allergen Reactions  . Codeine     sick  . Morphine And Related Other (See Comments)    unknown  . Percocet (Oxycodone-Acetaminophen)     unknown  . Plavix (Clopidogrel Bisulfate) Other (See Comments)    unknown  . Sulfur Other (See Comments)    unknown    Family History  Problem Relation Age of Onset  . Kidney disease Mother   . Kidney disease Brother      Prior to Admission medications   Medication Sig Start Date End Date Taking? Authorizing Provider  acetaminophen (TYLENOL) 500 MG tablet Take 500 mg by mouth every 6 (six) hours as needed. For pain   Yes Historical Provider, MD  Ascorbic Acid (VITAMIN C PO) Take 500 mg by mouth daily.    Yes Historical Provider, MD  aspirin 81 MG  chewable tablet Chew 81 mg by mouth daily.   Yes Historical Provider, MD  diazepam (VALIUM) 5 MG tablet Take 5 mg by mouth at bedtime.   Yes Historical Provider, MD  esomeprazole (NEXIUM) 40 MG capsule Take 40 mg by mouth daily before breakfast.    Yes Historical Provider, MD  folic acid (FOLVITE) 1 MG tablet Take 1 mg by mouth daily.    Yes Historical Provider, MD  HYDROcodone-acetaminophen (VICODIN) 5-500 MG per tablet Take 1 tablet by mouth every 6 (six) hours as needed. Pain 03/19/12  Yes Adeline C Viyuoh, MD  levothyroxine (SYNTHROID, LEVOTHROID) 50 MCG tablet Take 50 mcg by mouth daily.    Yes Historical Provider, MD  lisinopril-hydrochlorothiazide (PRINZIDE,ZESTORETIC) 20-12.5 MG per tablet Take 1 tablet by mouth daily.    Yes Historical Provider, MD  metoprolol (LOPRESSOR) 50 MG tablet Take 50 mg by mouth 2 (two)  times daily.    Yes Historical Provider, MD  Multiple Vitamin (MULTIVITAMIN) tablet Take 1 tablet by mouth daily.     Yes Historical Provider, MD  Omega-3 Fatty Acids (FISH OIL) 1200 MG CAPS Take 1,200 mg by mouth daily.   Yes Historical Provider, MD  potassium chloride (KLOR-CON M15) 15 MEQ tablet Take 2 tablets (30 mEq total) by mouth daily. 03/19/12  Yes Adeline C Viyuoh, MD  predniSONE (DELTASONE) 10 MG tablet Take 10 mg by mouth daily.   Yes Historical Provider, MD  promethazine (PHENERGAN) 25 MG tablet Take 25 mg by mouth every 6 (six) hours as needed. nausea   Yes Historical Provider, MD  rosuvastatin (CRESTOR) 20 MG tablet Take 20 mg by mouth daily.    Yes Historical Provider, MD  sertraline (ZOLOFT) 50 MG tablet Take 50 mg by mouth daily.    Yes Historical Provider, MD  warfarin (COUMADIN) 1 MG tablet Take 1.5 tablets (1.5 mg total) by mouth daily. Take 1.5 tablet (1.5 mg total) daily 03/19/12  Yes Kela Millin, MD   Physical Exam: Filed Vitals:   03/31/12 1342 03/31/12 1344 03/31/12 1403 03/31/12 1536  BP:  99/54  91/43  Pulse:  128  106  Temp:  99.5 F (37.5 C) 103 F (39.4 C) 100.9 F (38.3 C)  TempSrc:  Oral Rectal Other (Comment)  Resp:  22  22  SpO2: 90% 92%  98%     General:  Somnolent on initial evaluation however more alert and oriented on secondary evaluation in the emergency room  Eyes:  No icterus or pallor, slightly slow speech, sclera has arcus senilis  ENT:  Moderate dentition no JVD  Neck:  No thyromegaly  Cardiovascular:  S1-S2 tachycardic? Murmur  Respiratory:  Clinically clear with no added sound  Abdomen:  Soft nontender nondistended no rebound or guarding, no CVA tenderness  Skin:  Patient has no edema  Musculoskeletal:  Classic rheumatoid arthritis changes to fingers  Psychiatric:  Euthymic but flat affect  Neurologic:  5/5 power bilaterally smile symmetrical  Labs on Admission:  Basic Metabolic Panel:  Lab 03/31/12 4782  NA 135  K  4.7  CL 100  CO2 23  GLUCOSE 103*  BUN 31*  CREATININE 1.30*  CALCIUM 9.5  MG --  PHOS --   Liver Function Tests:  Lab 03/31/12 1400  AST 16  ALT 15  ALKPHOS 59  BILITOT 0.3  PROT 6.0  ALBUMIN 2.8*   No results found for this basename: LIPASE:5,AMYLASE:5 in the last 168 hours No results found for this basename: AMMONIA:5 in the last 168  hours CBC:  Lab 03/31/12 1400  WBC 24.1*  NEUTROABS 20.3*  HGB 10.6*  HCT 32.8*  MCV 85.0  PLT 258   Cardiac Enzymes: No results found for this basename: CKTOTAL:5,CKMB:5,CKMBINDEX:5,TROPONINI:5 in the last 168 hours  BNP (last 3 results)  Basename 03/18/12 0400 03/17/12 0530  PROBNP 3700.0* 1429.0*   CBG: No results found for this basename: GLUCAP:5 in the last 168 hours  Radiological Exams on Admission: Dg Chest Port 1 View  03/31/2012  *RADIOLOGY REPORT*  Clinical Data: Shortness of breath.  PORTABLE CHEST - 1 VIEW  Comparison: 03/19/2012 and CT 03/16/2012  Findings: There is a stable retrocardiac density consistent with a hiatal hernia.  The patient has fusion rods within the thoracic spine.  There are persistent low lung volumes without focal disease.  Atelectasis or scarring at the left lung base.  IMPRESSION: Low lung volumes without acute findings.  Hiatal hernia and suspect chronic volume loss and atelectasis at the left lung base.   Original Report Authenticated By: Richarda Overlie, M.D.     EKG: Independently reviewed.  Sinus tachycardia PR interval 0.08 QRS axis 40 slightly peaked T waves in V2 but not throughout the precordial leads no ST-T wave acute elevations some T-wave inversions in lead 3 and aVR however this is probably normal, no J-point elevation-impression sinus tachycardia with likely rate related ST elevations Compared to prior EKG done 03/16/12, this is not a major change except for the rate  Assessment/Plan Principal Problem:  *Shock Active Problems:  Renal insufficiency  RA (rheumatoid arthritis)  GERD  (gastroesophageal reflux disease)  Hiatal hernia  Depression  Anxiety  Protein-calorie malnutrition, moderate  Gait instability  Adrenal insufficiency   1.  undifferentiated shock-differential diagnosis includes adrenal insufficiency versus secondary to sepsis from an organism given lactic acid is 2.5-blood and urine cultures have been drawn. I will cover her broadly with vancomycin and Zosyn at this time. She received one dose of Rocephin in the emergency room for what seems to have been a urinary tract infection-her urinalysis is not consistent with these findings however. I did speak to critical care medicine who will consult on the patient and assist with management-appreciate in advance 2. possible adrenal insufficiency contributing to #1-patient unfortunately has already received one dose of IV Solu-Medrol 125 mg daily. She's been admitted in the past with similar symptoms. I suspect her multiple components to her shocklike picture and we will reorder Solu-Medrol every 6 hourly. There may be limited utility in getting a free a.m. cortisol but we will obtain the same in the morning 3. Hiatal hernia and reflux disease-possible precipitant for finding seen today. She is presented with a similar fashion in the past-as she is not complaining of emesis or dark stool at present we will hold on further workup at this time-[facility]. 4. Sinus tachycardia-likely rebound effect of withdrawal of beta blocker as she has not received any medications today per nursing home documentation. 5. Relative hypoxia her SpO2 was 90% range and dropped to 88% off of a. She does not use oxygen at home. I have a relatively high threshold to get a CT of the chest at present time given the fact that she is anticoagulated with an INR today of 2.58 and her tachycardia seems to be resolving with IV fluids and is currently down in the low 100 range down from 130s on admission. If this persists however I will  order a CT angiogram of the chest versus a VQ scan given  she has some mild renal insufficiency 6. Chronic kidney disease stage 2-3-her BUN/creatinine are baseline at present time 7. Leukocytosis-likely secondary to infectious process versus chronic steroid therapy. We will repeat labs in the morning 8. Rheumatoid arthritis-sees Dr. Dierdre Forth for the same. We will monitor currently at this time  Code Status: Full Family Communication:  none at bedside I did try to call her daughter Gladstone Lighter at phone number in the chart (indicate person spoken with, if applicable, with phone number if by telephone) Disposition Plan:  step down (indicate anticipated LOS)  Time spent:  85 minutes  Marlie Kuennen, [facility] Pager [phone]  If 7PM-7AM, please contact night-coverage [email or web address] Password [facility] 03/31/2012, 3:43 PM

## 2012-03-31 NOTE — ED Notes (Signed)
[id number]<QM> Expected date:03/31/12<BR> Expected time: 1:32 PM<BR> Means of arrival:Ambulance<BR> Comments:<BR> 82yoF, n/v

## 2012-03-31 NOTE — Progress Notes (Signed)
Clinical Social Work Department BRIEF PSYCHOSOCIAL ASSESSMENT 03/31/2012  Patient:  [name], [name]     Account Number:  [id number]     Admit date:  03/31/2012  Clinical Social Worker:  Doree Albee  Date/Time:  03/31/2012 09:50 PM  Referred by:  RN  Date Referred:  03/31/2012 Referred for  SNF Placement   Other Referral:   Interview type:  Patient Other interview type:    PSYCHOSOCIAL DATA Living Status:  FACILITY Admitted from facility:  [facility] Level of care:  Skilled Nursing Facility Primary support name:  Serina Cowper May Primary support relationship to patient:  CHILD, ADULT Degree of support available:   strong    CURRENT CONCERNS Current Concerns  Post-Acute Placement   Other Concerns:    SOCIAL WORK ASSESSMENT / PLAN CSW met with pt at bedside to complete psychosocial assessment and assist with pt dc plans.    Pt shared that she is a resident at [facility], and has been since June. Pt stated that her plan is to return when medically stable.    CSW and pt discussed that pt was being admitted per chart review and that the unit csw will assist with pt return to [facility] when medically stable.    Pt shared that her daughter is her primary support person.    CSW will complete fl2 for MD signature and place in patient shadow chart.   Assessment/plan status:  Psychosocial Support/Ongoing Assessment of Needs Other assessment/ plan:   discharge planning   Information/referral to community resources:   none identified at this time    PATIENT'S/FAMILY'S RESPONSE TO PLAN OF CARE: Patient thanked csw for concern and support. Pt is motivated to return to [facility] when medically stable.    Catha Gosselin, LCSWA  [phone] .03/31/2012 9:54pm

## 2012-03-31 NOTE — Consult Note (Signed)
Name: [name] MRN: [id number] DOB: April 01, 1930    LOS: 0  Referring Provider:  Dr. Mahala Menghini Reason for Referral:  Sepsis  PULMONARY / CRITICAL CARE MEDICINE  HPI:  78 yo female admitted from nursing home 03/31/2012 with fever (Tm 103), nausea/vomiting, and hypotension.  She was d/c from hospital 9/20 after tx for septic shock from possible pneumonia vs adrenal insufficiency.  She has hx of RA on chronic prednisone therapy.  She has NH bound since CVA June 2013.  She was in usual state of health until earlier this morning.  She became nauseous, and then had several episodes of vomiting.  She has not had abdominal pain or diarrhea.  She was noted to have temp 101.8 in NH.  As a result she was transferred to ER.  She was noted to have low SBP in the 90's.  She was given 3 liters IV fluid and solumedrol in the ER with improvement in her SBP to > 100.  She also reported symptomatic improvement.  She was noted to have lactic acid of 2.5.  She was given antibiotics in the ER, and blood and urine cultures were obtained.  PCCM asked by [facility] admitting team to assist with sepsis management.  She denies headache, sinus congestion, sore throat, dental pain, chest pain, cough, sputum, dysuria, or skin rash.  Her nausea has improved.   Past Medical History  Diagnosis Date  . SOB (shortness of breath)   . MI (myocardial infarction)   . Poor appetite   . Arthritis   . Fatigue   . Right ear pain   . Renal insufficiency   . RA (rheumatoid arthritis)   . PUD (peptic ulcer disease)   . GI bleed 2005  . GERD (gastroesophageal reflux disease)   . Hiatal hernia   . Hypothyroidism   . Osteoporosis   . Depression   . Anxiety   . Coronary artery disease   . CVA (cerebral vascular accident)   . TIA (transient ischemic attack)   . Fall   . DVT of lower extremity (deep venous thrombosis)    Past Surgical History  Procedure Date  . Cardiac catheterization     SHOWED RUPTURE PLAQUE IN THE LAD. THE  LAD IS NONOBSTRUCTIVE WITH ONLY 30-40% STENOSIS  . Nstemi 06/2010  . Spinal fusion surgery   . Knee surgery    Prior to Admission medications   Medication Sig Start Date End Date Taking? Authorizing Provider  acetaminophen (TYLENOL) 500 MG tablet Take 500 mg by mouth every 6 (six) hours as needed. For pain   Yes Historical Provider, MD  Ascorbic Acid (VITAMIN C PO) Take 500 mg by mouth daily.    Yes Historical Provider, MD  aspirin 81 MG chewable tablet Chew 81 mg by mouth daily.   Yes Historical Provider, MD  diazepam (VALIUM) 5 MG tablet Take 5 mg by mouth at bedtime.   Yes Historical Provider, MD  esomeprazole (NEXIUM) 40 MG capsule Take 40 mg by mouth daily before breakfast.    Yes Historical Provider, MD  folic acid (FOLVITE) 1 MG tablet Take 1 mg by mouth daily.    Yes Historical Provider, MD  HYDROcodone-acetaminophen (VICODIN) 5-500 MG per tablet Take 1 tablet by mouth every 6 (six) hours as needed. Pain 03/19/12  Yes Adeline C Viyuoh, MD  levothyroxine (SYNTHROID, LEVOTHROID) 50 MCG tablet Take 50 mcg by mouth daily.    Yes Historical Provider, MD  lisinopril-hydrochlorothiazide (PRINZIDE,ZESTORETIC) 20-12.5 MG per tablet Take 1 tablet  by mouth daily.    Yes Historical Provider, MD  metoprolol (LOPRESSOR) 50 MG tablet Take 50 mg by mouth 2 (two) times daily.    Yes Historical Provider, MD  Multiple Vitamin (MULTIVITAMIN) tablet Take 1 tablet by mouth daily.     Yes Historical Provider, MD  Omega-3 Fatty Acids (FISH OIL) 1200 MG CAPS Take 1,200 mg by mouth daily.   Yes Historical Provider, MD  potassium chloride (KLOR-CON M15) 15 MEQ tablet Take 2 tablets (30 mEq total) by mouth daily. 03/19/12  Yes Adeline C Viyuoh, MD  predniSONE (DELTASONE) 10 MG tablet Take 10 mg by mouth daily.   Yes Historical Provider, MD  promethazine (PHENERGAN) 25 MG tablet Take 25 mg by mouth every 6 (six) hours as needed. nausea   Yes Historical Provider, MD  rosuvastatin (CRESTOR) 20 MG tablet Take 20 mg by  mouth daily.    Yes Historical Provider, MD  sertraline (ZOLOFT) 50 MG tablet Take 50 mg by mouth daily.    Yes Historical Provider, MD  warfarin (COUMADIN) 1 MG tablet Take 1.5 tablets (1.5 mg total) by mouth daily. Take 1.5 tablet (1.5 mg total) daily 03/19/12  Yes Adeline Joselyn Glassman, MD   Allergies Allergies  Allergen Reactions  . Codeine     sick  . Morphine And Related Other (See Comments)    unknown  . Percocet (Oxycodone-Acetaminophen)     unknown  . Plavix (Clopidogrel Bisulfate) Other (See Comments)    unknown  . Sulfur Other (See Comments)    unknown    Family History Family History  Problem Relation Age of Onset  . Kidney disease Mother   . Kidney disease Brother    Social History  reports that she has never smoked. She has never used smokeless tobacco. She reports that she does not drink alcohol or use illicit drugs.  Review Of Systems:  Negative except above  Events Since Admission:  Current Status: Denies chest pain, abdominal pain, nausea.  Vital Signs: Temp:  [99.5 F (37.5 C)-103 F (39.4 C)] 100.9 F (38.3 C) (10/02 1536) Pulse Rate:  [106-128] 106  (10/02 1536) Resp:  [22] 22  (10/02 1536) BP: (91-99)/(43-54) 91/43 mmHg (10/02 1536) SpO2:  [90 %-98 %] 98 % (10/02 1536)  Physical Examination: General - no distress HEENT - PERRL, wears glasses, no sinus tenderness, no oral exudate, no LAN Cardiac - s1s2 regular with occasional extra beats, no murmur, pulses symmetric Chest - no wheeze/rales Abdomen - soft, non-tender, no organomegaly, + bowel sounds Ext - no e/c/c Neuro - Awake, alert, slight slurred speech, good comprehension Psych - normal mood, behavior  Urinalysis    Component Value Date/Time   COLORURINE YELLOW 03/31/2012 1437   APPEARANCEUR CLEAR 03/31/2012 1437   LABSPEC 1.020 03/31/2012 1437   PHURINE 5.5 03/31/2012 1437   GLUCOSEU NEGATIVE 03/31/2012 1437   HGBUR NEGATIVE 03/31/2012 1437   BILIRUBINUR NEGATIVE 03/31/2012 1437    KETONESUR NEGATIVE 03/31/2012 1437   PROTEINUR NEGATIVE 03/31/2012 1437   UROBILINOGEN 0.2 03/31/2012 1437   NITRITE NEGATIVE 03/31/2012 1437   LEUKOCYTESUR TRACE* 03/31/2012 1437     Dg Chest Port 1 View  03/31/2012  *RADIOLOGY REPORT*  Clinical Data: Shortness of breath.  PORTABLE CHEST - 1 VIEW  Comparison: 03/19/2012 and CT 03/16/2012  Findings: There is a stable retrocardiac density consistent with a hiatal hernia.  The patient has fusion rods within the thoracic spine.  There are persistent low lung volumes without focal disease.  Atelectasis or scarring  at the left lung base.  IMPRESSION: Low lung volumes without acute findings.  Hiatal hernia and suspect chronic volume loss and atelectasis at the left lung base.   Original Report Authenticated By: Richarda Overlie, M.D.     ASSESSMENT AND PLAN  PULMONARY  A: No active issues. P:   Oxygen as needed to keep SpO2 > 92% Bronchial hygiene  CARDIOVASCULAR  Lab 03/31/12 1400  TROPONINI --  LATICACIDVEN 2.5*  PROBNP --   Lines: PIV  A: Hypotension from sepsis w/o septic shock. Recent hx of DVT on coumadin therapy. Hx of HTN, CAD, hyperlipidemia. P:  Continue IV fluids to keep SBP > 90, MAP > 65 No indication for EGDT or CVL placement at this time Hold lisinopril, HCTZ, metoprolol for now Anticoagulation per primary team  RENAL  Lab 03/31/12 1400  NA 135  K 4.7  CL 100  CO2 23  BUN 31*  CREATININE 1.30*  CALCIUM 9.5  MG --  PHOS --   Intake/Output      10/01 0701 - 10/02 0700 10/02 0701 - 10/03 0700   Urine  20   Total Output  20   Net  -20         Foley:  10/02>>  A:  Mild renal insufficiency likely from volume depletion. P:   Monitor renal fx, urine outpt, electrolytes while giving IV fluid  GASTROINTESTINAL  Lab 03/31/12 1400  AST 16  ALT 15  ALKPHOS 59  BILITOT 0.3  PROT 6.0  ALBUMIN 2.8*    A:  Nausea/vomiting. Hx of GERD. P:   PRN zofran Continue PPI  HEMATOLOGIC  Lab 03/31/12 1400  HGB  10.6*  HCT 32.8*  PLT 258  INR 2.58*  APTT --   A:  Anemia of chronic disease. P:  F/u CBC Transfuse for Hb < 7  INFECTIOUS  Lab 03/31/12 1400  WBC 24.1*  PROCALCITON --   Cultures: Blood 10/02>> Urine 10/02>>  Antibiotics: Rocephin 10/02>> Vancomycin 10/02>>  A:  Sepsis ?source. P:   Abx as above Adjust based on culture results If no obvious source, then may need imaging of abdomen  ENDOCRINE  A:  Chronic prednisone therapy for RA with probable relative adrenal insufficiency. Hx of hypothyroidism. P:   Stress dose steroids per primary team Continue levothyroxine per primary team  NEUROLOGIC  A: Hx of CVA, Depression, chronic pain. P:   Zoloft, valium, pain meds per primary team  BEST PRACTICE / DISPOSITION Level of Care:  SDU Primary Service:  [facility] Consultants:  PCCM Code Status:  Full Diet:  Clear liquids DVT Px:  Chronic coumadin GI Px:  PPI  Coralyn Helling, MD [facility] Pulmonary/Critical Care 03/31/2012, 4:54 PM Pager:  [phone] After 3pm call: [phone]

## 2012-03-31 NOTE — ED Notes (Signed)
Daughter Cathlean Cower [phone]; [phone]

## 2012-03-31 NOTE — Progress Notes (Signed)
ANTICOAGULATION CONSULT NOTE - Initial Consult  Pharmacy Consult for Warfarin Indication: VTE  Allergies  Allergen Reactions  . Codeine     sick  . Morphine And Related Other (See Comments)    unknown  . Percocet (Oxycodone-Acetaminophen)     unknown  . Plavix (Clopidogrel Bisulfate) Other (See Comments)    unknown  . Sulfur Other (See Comments)    unknown    Patient Measurements:    Vital Signs: Temp: 99.4 F (37.4 C) (10/02 1930) Temp src: Other (Comment) (10/02 1536) BP: 95/40 mmHg (10/02 1907) Pulse Rate: 99  (10/02 1930)  Labs:  Basename 03/31/12 1400  HGB 10.6*  HCT 32.8*  PLT 258  APTT --  LABPROT 26.4*  INR 2.58*  HEPARINUNFRC --  CREATININE 1.30*  CKTOTAL --  CKMB --  TROPONINI --    The CrCl is unknown because both a height and weight (above a minimum accepted value) are required for this calculation.   Medical History: Past Medical History  Diagnosis Date  . SOB (shortness of breath)   . MI (myocardial infarction)   . Poor appetite   . Arthritis   . Fatigue   . Right ear pain   . Renal insufficiency   . RA (rheumatoid arthritis)   . PUD (peptic ulcer disease)   . GI bleed 2005  . GERD (gastroesophageal reflux disease)   . Hiatal hernia   . Hypothyroidism   . Osteoporosis   . Depression   . Anxiety   . Coronary artery disease   . CVA (cerebral vascular accident)   . TIA (transient ischemic attack)   . Fall   . DVT of lower extremity (deep venous thrombosis)     Medications:  Scheduled:    . sodium chloride  1,000 mL Intravenous Once  . acetaminophen  650 mg Oral Once  . cefTRIAXone (ROCEPHIN)  IV  1 g Intravenous Once  . diazepam  5 mg Oral QHS  . levothyroxine  50 mcg Oral QAC breakfast  . methylPREDNISolone (SOLU-MEDROL) injection  125 mg Intravenous Once  . methylPREDNISolone (SOLU-MEDROL) injection  125 mg Intravenous Q6H  . metoprolol  50 mg Oral BID  . pantoprazole  40 mg Oral Daily  . piperacillin-tazobactam  (ZOSYN)  IV  3.375 g Intravenous Q8H  . sertraline  50 mg Oral Daily  . sodium chloride  1,000 mL Intravenous Once  . sodium chloride  1,000 mL Intravenous Once  . sodium chloride  3 mL Intravenous Q12H  . vancomycin  1,000 mg Intravenous Q24H  . DISCONTD: warfarin  1.5 mg Oral Daily   Infusions:    . sodium chloride 125 mL/hr at 03/31/12 1953    Assessment:  82 YOF admit on warfarin for hx of recent DVT  PTA warfarin dose was 1.5mg  PO daily.  INR on admission is therapeutic at 2.58  Hgb 10.6; Per MD, anemia is from chronic disease.    Goal of Therapy:  INR 2-3   Plan:   Warfarin 1.5mg  PO tonight x1 dose  PT/INR daily  Pharmacy to f/u daily   Lynann Beaver PharmD, BCPS Pager [phone] 03/31/2012 8:26 PM

## 2012-04-01 ENCOUNTER — Encounter (HOSPITAL_COMMUNITY): Payer: Self-pay | Admitting: *Deleted

## 2012-04-01 DIAGNOSIS — E2749 Other adrenocortical insufficiency: Secondary | ICD-10-CM

## 2012-04-01 DIAGNOSIS — I2510 Atherosclerotic heart disease of native coronary artery without angina pectoris: Secondary | ICD-10-CM | POA: Diagnosis present

## 2012-04-01 DIAGNOSIS — N289 Disorder of kidney and ureter, unspecified: Secondary | ICD-10-CM

## 2012-04-01 DIAGNOSIS — M069 Rheumatoid arthritis, unspecified: Secondary | ICD-10-CM

## 2012-04-01 DIAGNOSIS — Z7901 Long term (current) use of anticoagulants: Secondary | ICD-10-CM

## 2012-04-01 LAB — GLUCOSE, CAPILLARY: Glucose-Capillary: 137 mg/dL — ABNORMAL HIGH (ref 70–99)

## 2012-04-01 LAB — COMPREHENSIVE METABOLIC PANEL
AST: 16 U/L (ref 0–37)
Albumin: 2.4 g/dL — ABNORMAL LOW (ref 3.5–5.2)
Alkaline Phosphatase: 51 U/L (ref 39–117)
BUN: 31 mg/dL — ABNORMAL HIGH (ref 6–23)
CO2: 23 mEq/L (ref 19–32)
Chloride: 103 mEq/L (ref 96–112)
Creatinine, Ser: 1.25 mg/dL — ABNORMAL HIGH (ref 0.50–1.10)
GFR calc non Af Amer: 40 mL/min — ABNORMAL LOW (ref >90)
Potassium: 4.6 mEq/L (ref 3.5–5.1)
Total Bilirubin: 0.3 mg/dL (ref 0.3–1.2)

## 2012-04-01 LAB — CBC
MCH: 27.2 pg (ref 26.0–34.0)
MCV: 86.1 fL (ref 78.0–100.0)
Platelets: 216 10*3/uL (ref 150–400)
RDW: 15.8 % — ABNORMAL HIGH (ref 11.5–15.5)
WBC: 17.9 10*3/uL — ABNORMAL HIGH (ref 4.0–10.5)

## 2012-04-01 LAB — TROPONIN I: Troponin I: 0.41 ng/mL (ref <0.30)

## 2012-04-01 LAB — CK TOTAL AND CKMB (NOT AT ARMC)
CK, MB: 10.5 ng/mL (ref 0.3–4.0)
Relative Index: 4.4 — ABNORMAL HIGH (ref 0.0–2.5)
Relative Index: 4.3 — ABNORMAL HIGH (ref 0.0–2.5)
Total CK: 239 U/L — ABNORMAL HIGH (ref 7–177)

## 2012-04-01 LAB — MRSA PCR SCREENING: MRSA by PCR: NEGATIVE

## 2012-04-01 LAB — PROTIME-INR: Prothrombin Time: 29.6 seconds — ABNORMAL HIGH (ref 11.6–15.2)

## 2012-04-01 MED ORDER — HYDROXYZINE HCL 10 MG PO TABS
10.0000 mg | ORAL_TABLET | Freq: Three times a day (TID) | ORAL | Status: DC | PRN
  Filled 2012-04-01: qty 1

## 2012-04-01 MED ORDER — HYDROCORTISONE SOD SUCCINATE 100 MG IJ SOLR: 100.0000 mg | Freq: Three times a day (TID) | INTRAMUSCULAR | Status: DC

## 2012-04-01 MED ORDER — ACETAMINOPHEN 325 MG PO TABS
650.0000 mg | ORAL_TABLET | Freq: Four times a day (QID) | ORAL | Status: DC | PRN
  Administered 2012-04-01 – 2012-04-03 (×2): 650 mg via ORAL
  Filled 2012-04-01 (×2): qty 2

## 2012-04-01 NOTE — Progress Notes (Signed)
Pt has slowly began to develop a slight to moderate red rash to her lower neck and throat area. It is itching slightly but pt denies the need for any medications to help resolve it. The pt and family stated that the last time she was in the hospital and on high dose IV steroids this happened. She is not having any trouble breathing,VSS, MD aware and putting in orders to d/c steroids and prn med for itching. Will continue to monitor and report this off to night RN.

## 2012-04-01 NOTE — ED Notes (Signed)
Critical Troponin 0.41, reported by Stanton Kidney in lab. Dr. Conley Rolls notified.

## 2012-04-01 NOTE — Progress Notes (Signed)
INITIAL ADULT NUTRITION ASSESSMENT Date: 04/01/2012   Time: 1:57 PM Reason for Assessment: Nutrition risk due to unintentional weight loss and decreased intake  ASSESSMENT: Female 78 y.o.  Dx: Shock  Hx:  Past Medical History  Diagnosis Date  . SOB (shortness of breath)   . MI (myocardial infarction)   . Poor appetite   . Arthritis   . Fatigue   . Right ear pain   . Renal insufficiency   . RA (rheumatoid arthritis)   . PUD (peptic ulcer disease)   . GI bleed 2005  . GERD (gastroesophageal reflux disease)   . Hiatal hernia   . Hypothyroidism   . Osteoporosis   . Depression   . Anxiety   . Coronary artery disease   . CVA (cerebral vascular accident)   . TIA (transient ischemic attack)   . Fall   . DVT of lower extremity (deep venous thrombosis)    Past Surgical History  Procedure Date  . Cardiac catheterization     SHOWED RUPTURE PLAQUE IN THE LAD. THE LAD IS NONOBSTRUCTIVE WITH ONLY 30-40% STENOSIS  . Nstemi 06/2010  . Spinal fusion surgery   . Knee surgery    Related Meds:     . sodium chloride  1,000 mL Intravenous Once  . acetaminophen  650 mg Oral Once  . cefTRIAXone (ROCEPHIN)  IV  1 g Intravenous Once  . diazepam  5 mg Oral QHS  . levothyroxine  50 mcg Oral QAC breakfast  . methylPREDNISolone (SOLU-MEDROL) injection  125 mg Intravenous Once  . methylPREDNISolone (SOLU-MEDROL) injection  125 mg Intravenous Q6H  . metoprolol  50 mg Oral BID  . pantoprazole  40 mg Oral Daily  . piperacillin-tazobactam (ZOSYN)  IV  3.375 g Intravenous Q8H  . sertraline  50 mg Oral Daily  . sodium chloride  1,000 mL Intravenous Once  . sodium chloride  1,000 mL Intravenous Once  . sodium chloride  3 mL Intravenous Q12H  . vancomycin  1,000 mg Intravenous Q24H  . warfarin  1.5 mg Oral Once  . Warfarin - Pharmacist Dosing Inpatient   Does not apply q1800  . DISCONTD: warfarin  1.5 mg Oral Daily   Ht: 5\' 5"  (165.1 cm)  Wt: 158 lb 11.7 oz (72 kg)  Ideal Wt: 57 kg %  Ideal Wt: 126  Usual Wt: 159# Wt Readings from Last 10 Encounters:  04/01/12 158 lb 11.7 oz (72 kg)  03/19/12 171 lb (77.565 kg)  12/01/11 150 lb 1.4 oz (68.08 kg)  05/12/11 152 lb 1.9 oz (69 kg)  11/13/10 145 lb (65.772 kg)    % Usual Wt: 100  Body mass index is 26.41 kg/(m^2).  Labs:  CMP     Component Value Date/Time   NA 138 04/01/2012 0324   K 4.6 04/01/2012 0324   CL 103 04/01/2012 0324   CO2 23 04/01/2012 0324   GLUCOSE 178* 04/01/2012 0324   BUN 31* 04/01/2012 0324   CREATININE 1.25* 04/01/2012 0324   CALCIUM 8.6 04/01/2012 0324   PROT 5.6* 04/01/2012 0324   ALBUMIN 2.4* 04/01/2012 0324   AST 16 04/01/2012 0324   ALT 15 04/01/2012 0324   ALKPHOS 51 04/01/2012 0324   BILITOT 0.3 04/01/2012 0324   GFRNONAA 40* 04/01/2012 0324   GFRAA 46* 04/01/2012 0324    Diet Order: Cardiac  Supplements/Tube Feeding:  none  IVF:    sodium chloride Last Rate: 125 mL/hr at 03/31/12 1953    Estimated Nutritional Needs:  Kcal: 1600-1700 Protein: 75-85 gm protein Fluid: >1.6L  Food/Nutrition Related Hx: Pt has been living at [facility] for rehab.  Regular diet.  Fair intake.  Does not like the food there.  Currently tolerating diet with good intake and good appetite per pt,  No recent weight loss per pt.  NUTRITION DIAGNOSIS: No dx at this time  MONITORING/EVALUATION(Goals): Monitor:  Intake, weight, labs Goal: >75% intake of meals.  EDUCATION NEEDS: -No education needs identified at this time  INTERVENTION: Continue current diet.    Dietitian [phone]  DOCUMENTATION CODES Per approved criteria  -Not Applicable    Jeoffrey Massed 04/01/2012, 1:57 PM

## 2012-04-01 NOTE — Progress Notes (Signed)
Pt c/o h/a. Pt does not have any prn pain meds ordered. Midlevel paged awaiting call back.

## 2012-04-01 NOTE — Progress Notes (Signed)
ANTICOAGULATION CONSULT NOTE - Follow up  Pharmacy Consult for Warfarin Indication: VTE  Allergies  Allergen Reactions  . Codeine     sick  . Morphine And Related Other (See Comments)    unknown  . Percocet (Oxycodone-Acetaminophen)     unknown  . Plavix (Clopidogrel Bisulfate) Other (See Comments)    unknown  . Sulfur Other (See Comments)    unknown    Patient Measurements: Height: 5\' 5"  (165.1 cm) Weight: 158 lb 11.7 oz (72 kg) IBW/kg (Calculated) : 57   Vital Signs: Temp: 96.4 F (35.8 C) (10/03 0500) Temp src: Core (Comment) (10/03 0415) BP: 101/54 mmHg (10/03 0500) Pulse Rate: 81  (10/03 0500)  Labs:  Basename 04/01/12 0720 04/01/12 0324 04/01/12 0122 03/31/12 1945 03/31/12 1400  HGB -- 9.4* -- -- 10.6*  HCT -- 29.8* -- -- 32.8*  PLT -- 216 -- -- 258  APTT -- -- -- -- --  LABPROT -- 29.6* -- -- 26.4*  INR -- 3.01* -- -- 2.58*  HEPARINUNFRC -- -- -- -- --  CREATININE -- 1.25* -- -- 1.30*  CKTOTAL -- -- -- -- --  CKMB -- -- -- -- --  TROPONINI 0.39* -- 0.41* <0.30 --    Estimated Creatinine Clearance: 35.7 ml/min (by C-G formula based on Cr of 1.25).   Medical History: Past Medical History  Diagnosis Date  . SOB (shortness of breath)   . MI (myocardial infarction)   . Poor appetite   . Arthritis   . Fatigue   . Right ear pain   . Renal insufficiency   . RA (rheumatoid arthritis)   . PUD (peptic ulcer disease)   . GI bleed 2005  . GERD (gastroesophageal reflux disease)   . Hiatal hernia   . Hypothyroidism   . Osteoporosis   . Depression   . Anxiety   . Coronary artery disease   . CVA (cerebral vascular accident)   . TIA (transient ischemic attack)   . Fall   . DVT of lower extremity (deep venous thrombosis)     Medications:  Scheduled:     . sodium chloride  1,000 mL Intravenous Once  . acetaminophen  650 mg Oral Once  . cefTRIAXone (ROCEPHIN)  IV  1 g Intravenous Once  . diazepam  5 mg Oral QHS  . levothyroxine  50 mcg Oral QAC  breakfast  . methylPREDNISolone (SOLU-MEDROL) injection  125 mg Intravenous Once  . methylPREDNISolone (SOLU-MEDROL) injection  125 mg Intravenous Q6H  . metoprolol  50 mg Oral BID  . pantoprazole  40 mg Oral Daily  . piperacillin-tazobactam (ZOSYN)  IV  3.375 g Intravenous Q8H  . sertraline  50 mg Oral Daily  . sodium chloride  1,000 mL Intravenous Once  . sodium chloride  1,000 mL Intravenous Once  . sodium chloride  3 mL Intravenous Q12H  . vancomycin  1,000 mg Intravenous Q24H  . warfarin  1.5 mg Oral Once  . Warfarin - Pharmacist Dosing Inpatient   Does not apply q1800  . DISCONTD: warfarin  1.5 mg Oral Daily   Infusions:     . sodium chloride 125 mL/hr at 03/31/12 1953    Assessment:  82 YOF admitted with suspected pna, on warfarin for hx of recent DVT.  PTA warfarin dose was 1.5mg  PO daily.  INR rose overnight, now slightly supratherapeutic.  Broad-spectrum abx will increase response to warfarin.   Hx GIB, GERD noted. On PPI.  Hgb down to 9.4. No bleeding reported/documented. Per  MD, anemia is from chronic disease.    Goal of Therapy:  INR 2-3   Plan:   No warfarin today.   F/u PT/INR daily.  Charolotte Eke, PharmD, pager [phone]. 04/01/2012,9:10 AM.

## 2012-04-01 NOTE — Progress Notes (Signed)
Name: [name] MRN: [id number] DOB: [birth year]    LOS: 1  Referring Provider:  Dr. Mahala Menghini Reason for Referral:  Sepsis  PULMONARY / CRITICAL CARE MEDICINE  HPI:  78 yo female admitted from nursing home 03/31/2012 with fever (Tm 103), nausea/vomiting, and hypotension.  She was d/c from hospital 9/20 after tx for septic shock from possible pneumonia vs adrenal insufficiency.  She has hx of RA on chronic prednisone therapy.  She has NH bound since CVA June 2013.  She was in usual state of health until earlier this morning.  She became nauseous, and then had several episodes of vomiting.  She has not had abdominal pain or diarrhea.  She was noted to have temp 101.8 in NH.  As a result she was transferred to ER.  She was noted to have low SBP in the 90's.  She was given 3 liters IV fluid and solumedrol in the ER with improvement in her SBP to > 100.  She also reported symptomatic improvement.  She was noted to have lactic acid of 2.5.  She was given antibiotics in the ER, and blood and urine cultures were obtained.  PCCM asked by [facility] admitting team to assist with sepsis management.  She denies headache, sinus congestion, sore throat, dental pain, chest pain, cough, sputum, dysuria, or skin rash.  Her nausea has improved.  Events Since Admission: 10/3 BP improved this morning  Current Status: Feeling better this morning, wants to eat  Vital Signs: Temp:  [96.4 F (35.8 C)-103 F (39.4 C)] 96.4 F (35.8 C) (10/03 0500) Pulse Rate:  [81-128] 81  (10/03 0500) Resp:  [14-22] 15  (10/03 0500) BP: (86-112)/(40-70) 101/54 mmHg (10/03 0500) SpO2:  [90 %-100 %] 96 % (10/03 0500) Weight:  [72 kg (158 lb 11.7 oz)] 72 kg (158 lb 11.7 oz) (10/03 0500)  Physical Examination: Gen: no acute distress HEENT: NCAT, MMM PULM: CTA B CV: RRR, slight systolic murmur AB: BS+, soft, nontender Ext: trace edema, warm Neuro: A&O4, conversant  Urinalysis    Component Value Date/Time   COLORURINE  YELLOW 03/31/2012 1437   APPEARANCEUR CLEAR 03/31/2012 1437   LABSPEC 1.020 03/31/2012 1437   PHURINE 5.5 03/31/2012 1437   GLUCOSEU NEGATIVE 03/31/2012 1437   HGBUR NEGATIVE 03/31/2012 1437   BILIRUBINUR NEGATIVE 03/31/2012 1437   KETONESUR NEGATIVE 03/31/2012 1437   PROTEINUR NEGATIVE 03/31/2012 1437   UROBILINOGEN 0.2 03/31/2012 1437   NITRITE NEGATIVE 03/31/2012 1437   LEUKOCYTESUR TRACE* 03/31/2012 1437     X-ray Chest Pa And Lateral   03/31/2012  *RADIOLOGY REPORT*  Clinical Data: Pneumonia.  Shortness of breath.  CHEST - 2 VIEW  Comparison: 10/02/2013earlier the same day no  Findings: No focal consolidation. Streaky opacity the medial retrocardiac region is probably atelectatic, adjacent to the hiatal hernia.  No pulmonary edema.  No pleural effusion. Cardiopericardial silhouette is at upper limits of normal for size. Hiatal hernia again noted.  Bilateral thoracolumbar fusion rods evident. Telemetry leads overlie the chest.  IMPRESSION: Stable exam. Retrocardiac atelectasis.  Hiatal hernia.   Original Report Authenticated By: ERIC A. MANSELL, M.D.    Dg Chest Port 1 View  03/31/2012  *RADIOLOGY REPORT*  Clinical Data: Shortness of breath.  PORTABLE CHEST - 1 VIEW  Comparison: 03/19/2012 and CT 03/16/2012  Findings: There is a stable retrocardiac density consistent with a hiatal hernia.  The patient has fusion rods within the thoracic spine.  There are persistent low lung volumes without focal disease.  Atelectasis or  scarring at the left lung base.  IMPRESSION: Low lung volumes without acute findings.  Hiatal hernia and suspect chronic volume loss and atelectasis at the left lung base.   Original Report Authenticated By: Richarda Overlie, M.D.     ASSESSMENT AND PLAN  PULMONARY  A: No active issues. P:   Oxygen as needed to keep SpO2 > 92% Bronchial hygiene  CARDIOVASCULAR  Lab 04/01/12 0720 04/01/12 0122 03/31/12 1945 03/31/12 1400  TROPONINI 0.39* 0.41* <0.30 --  LATICACIDVEN -- -- -- 2.5*    PROBNP -- -- -- --   Lines: PIV  A: Hypotension from sepsis w/o septic shock resolved Recent hx of DVT on coumadin therapy. Hx of HTN, CAD, hyperlipidemia. P:  Could KVO fluids BP meds per primary team Anticoagulation per primary team  RENAL  Lab 04/01/12 0324 03/31/12 1400  NA 138 135  K 4.6 4.7  CL 103 100  CO2 23 23  BUN 31* 31*  CREATININE 1.25* 1.30*  CALCIUM 8.6 9.5  MG -- --  PHOS -- --   Intake/Output      10/02 0701 - 10/03 0700 10/03 0701 - 10/04 0700   I.V. (mL/kg) 500 (6.9)    IV Piggyback 300    Total Intake(mL/kg) 800 (11.1)    Urine (mL/kg/hr) 595 (0.3)    Total Output 595    Net +205          Foley:  10/02>>  A:  Mild renal insufficiency likely from volume depletion, stable P:   Monitor renal fx, urine outpt, electrolytes while giving IV fluid  GASTROINTESTINAL  Lab 04/01/12 0324 03/31/12 1400  AST 16 16  ALT 15 15  ALKPHOS 51 59  BILITOT 0.3 0.3  PROT 5.6* 6.0  ALBUMIN 2.4* 2.8*    A:  Nausea/vomiting. Hx of GERD. P:   PRN zofran Continue PPI  HEMATOLOGIC  Lab 04/01/12 0324 03/31/12 1400  HGB 9.4* 10.6*  HCT 29.8* 32.8*  PLT 216 258  INR 3.01* 2.58*  APTT -- --   A:  Anemia of chronic disease. DVT P:  F/u CBC Transfuse for Hb < 7 Anticoagulation per primary team  INFECTIOUS  Lab 04/01/12 0324 03/31/12 1400  WBC 17.9* 24.1*  PROCALCITON -- --   Cultures: Blood 10/02>> Urine 10/02>>  Antibiotics: Rocephin 10/02>> Vancomycin 10/02>>  A:  Sepsis ?source. Overall improving P:   Abx as above Adjust based on culture results If no obvious source, then may need imaging of abdomen  ENDOCRINE  A:  Chronic prednisone therapy for RA with probable relative adrenal insufficiency. Hx of hypothyroidism. P:   Stress dose steroids per primary team Continue levothyroxine per primary team  NEUROLOGIC  A: Hx of CVA, Depression, chronic pain. P:   Zoloft, valium, pain meds per primary team  BEST PRACTICE /  DISPOSITION Level of Care:  SDU Primary Service:  [facility]:  PCCM Code Status:  Full Diet:  Clear liquids DVT Px:  Chronic coumadin GI Px:  PPI  Improved today, PCCM to sign off.  Yolonda Kida PCCM Pager: [phone] Cell: [phone] If no response, call [phone]

## 2012-04-01 NOTE — Consult Note (Signed)
Admit date: 03/31/2012 Referring Physician  Dr. Mahala Menghini Primary Physician Oneal Grout, MD Primary Cardiologist  Dr. Katrinka Blazing Reason for Consultation  elevated troponin  HPI: 78 year old female with coronary artery disease, moderate LAD lesion discovered 1/12 in the setting of non-ST elevation myocardial infarction treated medically secondary to high risk for recurrent GI bleeding, readmission November 2012 with Cardiolite showing no ischemia who is currently being admitted with possible sepsis/urosepsis in the setting of rheumatoid arthritis, chronic immunosuppression due to steroids. Troponin was obtained and initially was less than 0.30 but second set was 0.41 and third set was 0.39. An MB at that time was 10.5 and a total CK was 239. Her creatinine is 1.3, white count was 24 down to 17. INR is 3.01.  She is hungry, no chest pain, no nausea. She feels better than on admission. Mild hypotension has resolved.   PMH:   Past Medical History  Diagnosis Date  . SOB (shortness of breath)   . MI (myocardial infarction)   . Poor appetite   . Arthritis   . Fatigue   . Right ear pain   . Renal insufficiency   . RA (rheumatoid arthritis)   . PUD (peptic ulcer disease)   . GI bleed 2005  . GERD (gastroesophageal reflux disease)   . Hiatal hernia   . Hypothyroidism   . Osteoporosis   . Depression   . Anxiety   . Coronary artery disease   . CVA (cerebral vascular accident)   . TIA (transient ischemic attack)   . Fall   . DVT of lower extremity (deep venous thrombosis)     PSH:   Past Surgical History  Procedure Date  . Cardiac catheterization     SHOWED RUPTURE PLAQUE IN THE LAD. THE LAD IS NONOBSTRUCTIVE WITH ONLY 30-40% STENOSIS  . Nstemi 06/2010  . Spinal fusion surgery   . Knee surgery    Allergies:  Codeine; Morphine and related; Percocet; Plavix; and Sulfur Prior to Admit Meds:   Prescriptions prior to admission  Medication Sig Dispense Refill  . acetaminophen (TYLENOL) 500  MG tablet Take 500 mg by mouth every 6 (six) hours as needed. For pain      . Ascorbic Acid (VITAMIN C PO) Take 500 mg by mouth daily.       Marland Kitchen aspirin 81 MG chewable tablet Chew 81 mg by mouth daily.      . diazepam (VALIUM) 5 MG tablet Take 5 mg by mouth at bedtime.      Marland Kitchen esomeprazole (NEXIUM) 40 MG capsule Take 40 mg by mouth daily before breakfast.       . folic acid (FOLVITE) 1 MG tablet Take 1 mg by mouth daily.       Marland Kitchen HYDROcodone-acetaminophen (VICODIN) 5-500 MG per tablet Take 1 tablet by mouth every 6 (six) hours as needed. Pain  30 tablet  0  . levothyroxine (SYNTHROID, LEVOTHROID) 50 MCG tablet Take 50 mcg by mouth daily.       Marland Kitchen lisinopril-hydrochlorothiazide (PRINZIDE,ZESTORETIC) 20-12.5 MG per tablet Take 1 tablet by mouth daily.       . metoprolol (LOPRESSOR) 50 MG tablet Take 50 mg by mouth 2 (two) times daily.       . Multiple Vitamin (MULTIVITAMIN) tablet Take 1 tablet by mouth daily.        . Omega-3 Fatty Acids (FISH OIL) 1200 MG CAPS Take 1,200 mg by mouth daily.      . potassium chloride (KLOR-CON M15) 15 MEQ tablet Take  2 tablets (30 mEq total) by mouth daily.      . predniSONE (DELTASONE) 10 MG tablet Take 10 mg by mouth daily.      . promethazine (PHENERGAN) 25 MG tablet Take 25 mg by mouth every 6 (six) hours as needed. nausea      . rosuvastatin (CRESTOR) 20 MG tablet Take 20 mg by mouth daily.       . sertraline (ZOLOFT) 50 MG tablet Take 50 mg by mouth daily.       Marland Kitchen warfarin (COUMADIN) 1 MG tablet Take 1.5 tablets (1.5 mg total) by mouth daily. Take 1.5 tablet (1.5 mg total) daily       Fam HX:    Family History  Problem Relation Age of Onset  . Kidney disease Mother   . Kidney disease Brother    Social HX:    History   Social History  . Marital Status: Widowed    Spouse Name: N/A    Number of Children: N/A  . Years of Education: N/A   Occupational History  . Not on file.   Social History Main Topics  . Smoking status: Never Smoker   . Smokeless  tobacco: Never Used  . Alcohol Use: No  . Drug Use: No  . Sexually Active: No   Other Topics Concern  . Not on file   Social History Narrative   Patient used to work at [employer] for 25 years until she retiredShe [place] herself [place] until she had a stroke earlier in 2013 JuneHer next of kin is her daughterShe does get physical therapy was everyday at [facility] nursing home     ROS: Denies any chest pain, shortness of breath, strokelike symptoms. Positive for recent weakness, lethargy. All 11 ROS were addressed and are negative except what is stated in the HPI  Physical Exam: Blood pressure 134/65, pulse 94, temperature 98.8 F (37.1 C), temperature source Core (Comment), resp. rate 19, height 5\' 5"  (1.651 m), weight 72 kg (158 lb 11.7 oz), SpO2 96.00%.    General: Well developed, well nourished, in no acute distress Head: Eyes PERRLA, No xanthomas.   Normal cephalic and atramatic  Lungs:  Clear bilaterally to auscultation and percussion. Normal respiratory effort. No wheezes, no rales. Heart:   HRRR S1 S2 Pulses are 2+ & equal. No murmur.            No carotid bruit. No JVD.  No abdominal bruits. No femoral bruits. Abdomen: Bowel sounds are positive, abdomen soft and non-tender without masses. No hepatosplenomegaly. Msk:  Back normal. Normal strength and tone for age. Extremities:  No clubbing, cyanosis or edema.  DP +1 Neuro: Alert and oriented X 3, non-focal, MAE x 4 GU: Deferred Rectal: Deferred Psych:  Good affect, responds appropriately  Labs:   Lab Results  Component Value Date   WBC 17.9* 04/01/2012   HGB 9.4* 04/01/2012   HCT 29.8* 04/01/2012   MCV 86.1 04/01/2012   PLT 216 04/01/2012    Lab 04/01/12 0324  NA 138  K 4.6  CL 103  CO2 23  BUN 31*  CREATININE 1.25*  CALCIUM 8.6  PROT 5.6*  BILITOT 0.3  ALKPHOS 51  ALT 15  AST 16  GLUCOSE 178*   No results found for this basename: PTT   Lab Results  Component Value Date   INR 3.01* 04/01/2012   INR  2.58* 03/31/2012   INR 1.61* 03/19/2012   Lab Results  Component Value Date  CKTOTAL 239* 04/01/2012   CKMB 10.5* 04/01/2012   TROPONINI 0.39* 04/01/2012     Lab Results  Component Value Date   CHOL 143 12/02/2011   CHOL 160 05/11/2011   CHOL 130 11/13/2010   Lab Results  Component Value Date   HDL 50 12/02/2011   HDL 52 05/11/2011   HDL 51.50 11/13/2010   Lab Results  Component Value Date   LDLCALC 37 12/02/2011   LDLCALC 50 05/11/2011   Lab Results  Component Value Date   TRIG 280* 12/02/2011   TRIG 292* 05/11/2011   TRIG 205.0* 11/13/2010   Lab Results  Component Value Date   CHOLHDL 2.9 12/02/2011   CHOLHDL 3.1 05/11/2011   CHOLHDL 3 11/13/2010   Lab Results  Component Value Date   LDLDIRECT 60.7 11/13/2010      Radiology:    CXR: IMPRESSION:  Stable exam. Retrocardiac atelectasis  EKG:  Sinus tachycardia rate 126 on admission with Q waves inferiorly, possible old inferior infarct, no significant change from prior. Personally viewed.   ASSESSMENT/PLAN:   78 year old female with mild to moderate nonobstructive coronary artery disease, nuclear stress test in November 2012 with no ischemia here with hypotension, possible sepsis, adrenal insufficiency with mildly elevated troponin trending down.  1. Elevated troponin-I. agree with assessment that this is likely a type II NSTEMI in the setting of hypotension/acute medical illness/infection. Ejection fraction has been normal in the past. I will obtain echocardiogram to ensure proper structure and function. Continue with medical management. Currently anticoagulated with warfarin.   2. Chronic anticoagulation - [place].   3. Mild to moderate CAD - recent reassuring NUC.  Will follow. Will notify Dr. Katrinka Blazing.   Donato Schultz, MD  04/01/2012  2:07 PM

## 2012-04-01 NOTE — Progress Notes (Signed)
PROGRESS NOTE  [name]:[id number] DOB: 09-28-1929 DOA: 03/31/2012 PCP: Oneal Grout, MD  Brief narrative: 78 yr old CF admitted with potentiatial sepsis, likely of urinary cause with hypotension and tachycardia in a setting of chronic immunosuppression with steroids for RA  Past medical history-As per Problem list Chart review  Rheumatoid arthritis diagnosed at age 6  History of right total knee arthroplasty 10/08/2000  Reported history of 3 breast biopsies  Reported history of remote CVA which affected right side of her brain left side of her body and was managed by Dr. Anne Hahn for this unclear as to when this occurred  Hysterectomy 1980, rectocele repair 1980  Complicated back surgery and fusion done in the past at [facility]  Admission 09/09/2003 for symptomatic normocytic anemia-EGD done at that time showed prepyloric antral ulcer in with no visible vessel-she was scoped by Dr. Evette Cristal at that time of [facility]  Admission 01/23/2004 for right subpatellar arthritis-had surgery at that time  Admission 09/17/2004 with vertigo secondary to possible labyrinthitis with no evidence of CNS event  Admission 01/23/2007 and with nausea and vomiting secondary to adrenal insufficiency and crisis and gastroenteritis precipitating adrenal crisis with steroid-dependent rheumatoid arthritis  admitted 08/19/2007 for a left total knee arthroplasty  Admitted 08/27/2007 Enterobacter UTI and sepsis with abnormal liver function at that time  Admitted 07/06/2010 with non-ST segment elevation MI and nonobstructive ruptured plaque in the LAD-she was not candidate for stenting and has a plaque was nonobstructive given the fact that she had a high risk for recurrent GI bleed in setting of dual antiplatelet therapy it was decided that cardiology at that time to continue medical therapy and she is treated with aspirin and presented initially  Admission 05/10/2011 with infrascapular back pain-she  underwent lexis and Cardiolite study showing no evidence of dysfunction  Admission 12/01/2011 with acute left subcortical lacunar infarct corpus callosum with dysarthria-at that time she had carotid Dopplers which were nonrevealing normal echocardiogram MRI showing what was noted earlier her aspirin is advanced to full dose 325 mg given she has poor tolerance to Plavix-she also had another MRI during that admission showing new A. of restricted diffusion in most posterior left medial frontal lobe felt to call for weakness of right extremity and stable infarct corpus callosum left singulate gyrus  Last admission was 03/16/2012 for nausea vomiting and septic shock and she was admitted by critical care medicine at that time   Consultants:  PCCM  Procedures:  Portable chest x-ray 04/01/12 = low lung volumes without acute findings, hiatal hernia and suspect chronic volume loss and atelectasis at left lung base   Antibiotics:  Cefepime 10/2  Zosyn 10/02  Vancomycin 10/02  Urine culture pending   Subjective  Looks and feels much better than yesterday. Feels hungry and wishes to eat. Does not want to have clear liquids. Actually wishes to eat solid food. States that she has no chest pain despite her troponin being elevated overnight at 0.44 No nausea no vomiting no blurred or double vision no overall weakness Daughter relates that the right lower extremity had some redness on it which has since disappeared. Patient has had instrumentation and hardware of the ankle and knee in the past   Objective    Interim History: Noted troponin of 0.44 Noted moderate hypothermia 35.9C Noted mild hypotension which has since resolved.  Telemetry: Sinus rhythm  Objective: Filed Vitals:   04/01/12 0330 04/01/12 0400 04/01/12 0415 04/01/12 0500  BP: 95/53 86/47 110/54 101/54  Pulse: 83 82 83 81  Temp: 96.8 F (36 C) 96.6 F (35.9 C) 96.6 F (35.9 C) 96.4 F (35.8 C)  TempSrc: Core (Comment)  Core (Comment) Core (Comment)   Resp: 16 17 14 15   Height:      Weight:    158 lb 11.7 oz (72 kg)  SpO2: 96% 95% 97% 96%    Intake/Output Summary (Last 24 hours) at 04/01/12 0925 Last data filed at 04/01/12 1610  Gross per 24 hour  Intake    800 ml  Output    595 ml  Net    205 ml    Exam:  General:  alert and oriented on secondary evaluation in the emergency room  Eyes: No icterus or pallor, slightly slow speech, sclera has arcus senilis  ENT: Moderate dentition no JVD  Neck: No thyromegaly  Cardiovascular: S1-S2 tachycardic? Murmur  Respiratory: Clinically clear with no added sound  Abdomen: Soft nontender nondistended no rebound or guarding, no CVA tenderness   Data Reviewed: Basic Metabolic Panel:  Lab 04/01/12 9604 03/31/12 1400  NA 138 135  K 4.6 4.7  CL 103 100  CO2 23 23  GLUCOSE 178* 103*  BUN 31* 31*  CREATININE 1.25* 1.30*  CALCIUM 8.6 9.5  MG -- --  PHOS -- --   Liver Function Tests:  Lab 04/01/12 0324 03/31/12 1400  AST 16 16  ALT 15 15  ALKPHOS 51 59  BILITOT 0.3 0.3  PROT 5.6* 6.0  ALBUMIN 2.4* 2.8*   No results found for this basename: LIPASE:5,AMYLASE:5 in the last 168 hours No results found for this basename: AMMONIA:5 in the last 168 hours CBC:  Lab 04/01/12 0324 03/31/12 1400  WBC 17.9* 24.1*  NEUTROABS -- 20.3*  HGB 9.4* 10.6*  HCT 29.8* 32.8*  MCV 86.1 85.0  PLT 216 258   Cardiac Enzymes:  Lab 04/01/12 0720 04/01/12 0122 03/31/12 1945  CKTOTAL -- -- --  CKMB -- -- --  CKMBINDEX -- -- --  TROPONINI 0.39* 0.41* <0.30   BNP: No components found with this basename: POCBNP:5 CBG: No results found for this basename: GLUCAP:5 in the last 168 hours  Recent Results (from the past 240 hour(s))  MRSA PCR SCREENING     Status: Normal   Collection Time   04/01/12  2:44 AM      Component Value Range Status Comment   MRSA by PCR NEGATIVE  NEGATIVE Final      Studies:              All Imaging reviewed and is as per above  notation   Scheduled Meds:   . sodium chloride  1,000 mL Intravenous Once  . acetaminophen  650 mg Oral Once  . cefTRIAXone (ROCEPHIN)  IV  1 g Intravenous Once  . diazepam  5 mg Oral QHS  . levothyroxine  50 mcg Oral QAC breakfast  . methylPREDNISolone (SOLU-MEDROL) injection  125 mg Intravenous Once  . methylPREDNISolone (SOLU-MEDROL) injection  125 mg Intravenous Q6H  . metoprolol  50 mg Oral BID  . pantoprazole  40 mg Oral Daily  . piperacillin-tazobactam (ZOSYN)  IV  3.375 g Intravenous Q8H  . sertraline  50 mg Oral Daily  . sodium chloride  1,000 mL Intravenous Once  . sodium chloride  1,000 mL Intravenous Once  . sodium chloride  3 mL Intravenous Q12H  . vancomycin  1,000 mg Intravenous Q24H  . warfarin  1.5 mg Oral Once  . Warfarin - Pharmacist Dosing  Inpatient   Does not apply q1800  . DISCONTD: warfarin  1.5 mg Oral Daily   Continuous Infusions:   . sodium chloride 125 mL/hr at 03/31/12 1953     Assessment/Plan: Principal Problem:  *Shock  Active Problems:  Renal insufficiency  RA (rheumatoid arthritis)  GERD (gastroesophageal reflux disease)  Hiatal hernia  Depression  Anxiety  Protein-calorie malnutrition, moderate  Gait instability  Adrenal insufficiency   1. Probable septic shock-differential diagnosis includes adrenal insufficiency versus secondary to sepsis from an organism given lactic acid is 2.5-blood and urine cultures have been drawn-is still pending. I will cover her broadly with vancomycin and Zosyn at this time. She received one dose of Rocephin in the emergency room for what seems to have been a urinary tract infection-her urinalysis is not consistent with these findings however. Appreciate critical care assistance in her case and white count has trended downwards although patient still slightly hypothermic and tachycardic her pressure has rebounded nicely and I suspect her hypotension overnight was because she was sleeping. 2. possible adrenal  insufficiency contributing to #1-patient unfortunately has already received one dose of IV Solu-Medrol 125 mg daily. She's been admitted in the past with similar symptoms. I suspect her multiple components to her shocklike picture and we will reorder Solu-Medrol every 6 hourly. Random a.m. cortisol is still pending this morning 10/3 3. Hiatal hernia and reflux disease-possible precipitant for finding seen today. She is presented with a similar fashion in the past-as she is not complaining of emesis or dark stool at present we will hold on further workup at this time-[facility]. 4. Elevated troponin-? NSTEMI-? Type II myocardial infarction-get stat EKG, cycle cardiac enzymes, troponin seems to be trending downward and this may be secondary to renal insufficiency which is mild however cannot ignore the fact that she has a history of CAD in the past in 2012. Her cardiologist is Dr. Mendel Ryder and we will get him involved if there any changes on EKG 5. Sinus tachycardia-likely rebound effect of withdrawal of beta blocker as she has not received any medications today per nursing home documentation-we will cautiously reimplement beta blocker with hold parameters. 6. Relative hypoxia her SpO2 was 90% range and dropped to 88% off of a. She does not use oxygen at home. I have a relatively high threshold to get a CT of the chest at present time given the fact that she is anticoagulated with an INR today of 2.58 and her tachycardia seems to be resolving with IV fluids and is currently down in the low 100 range down from 130s on admission. If this persists however I will order a CT angiogram of the chest versus a VQ scan given she has some mild renal insufficiency 7. Chronic kidney disease stage 2-3-her BUN/creatinine are baseline at present time 8. Leukocytosis-likely secondary to infectious process versus chronic steroid therapy. We will repeat labs in the morning this is trended  downward 9. Rheumatoid arthritis-sees Dr. Dierdre Forth for the same. We will monitor currently at this time. Continue stress dose steroids as Solu-Medrol can potentially transitioned to prednisone taper in one to 2 days   Code Status: full Family Communication: Updated Alicea at bedside in detail Disposition Plan: Step down   Pleas Koch, MD  [facility] Pager [phone] 04/01/2012, 9:25 AM    LOS: 1 day

## 2012-04-02 LAB — URINE CULTURE
Colony Count: NO GROWTH
Culture: NO GROWTH

## 2012-04-02 LAB — PROTIME-INR: Prothrombin Time: 45.2 seconds — ABNORMAL HIGH (ref 11.6–15.2)

## 2012-04-02 LAB — CBC WITH DIFFERENTIAL/PLATELET
Lymphocytes Relative: 3 % — ABNORMAL LOW (ref 12–46)
Lymphs Abs: 0.6 10*3/uL — ABNORMAL LOW (ref 0.7–4.0)
MCV: 84.8 fL (ref 78.0–100.0)
Neutrophils Relative %: 92 % — ABNORMAL HIGH (ref 43–77)
Platelets: 210 10*3/uL (ref 150–400)
RBC: 2.96 MIL/uL — ABNORMAL LOW (ref 3.87–5.11)
WBC: 20.8 10*3/uL — ABNORMAL HIGH (ref 4.0–10.5)

## 2012-04-02 NOTE — Progress Notes (Signed)
Pt with inr of 5.32 midlevel called awaiting call back.

## 2012-04-02 NOTE — Progress Notes (Signed)
  Echocardiogram 2D Echocardiogram has been performed.  Aizlyn Schifano 04/02/2012, 9:55 AM

## 2012-04-02 NOTE — Progress Notes (Signed)
Pharmacy called and informed of pt's INR of 5.32

## 2012-04-02 NOTE — Progress Notes (Signed)
PROGRESS NOTE  [name] ZOX:[id number] DOB: 13-Mar-1930 DOA: 03/31/2012 PCP: Oneal Grout, MD  Brief narrative: 78 yr old CF admitted with potentiatial sepsis, likely of urinary cause with hypotension and tachycardia in a setting of chronic immunosuppression with steroids for RA  Past medical history-As per Problem list Chart review  Rheumatoid arthritis diagnosed at age 14  History of right total knee arthroplasty 10/08/2000  Reported history of 3 breast biopsies  Reported history of remote CVA which affected right side of her brain left side of her body and was managed by Dr. Anne Hahn for this unclear as to when this occurred  Hysterectomy 1980, rectocele repair 1980  Complicated back surgery and fusion done in the past at [facility]  Admission 09/09/2003 for symptomatic normocytic anemia-EGD done at that time showed prepyloric antral ulcer in with no visible vessel-she was scoped by Dr. Evette Cristal at that time of [facility]  Admission 01/23/2004 for right subpatellar arthritis-had surgery at that time  Admission 09/17/2004 with vertigo secondary to possible labyrinthitis with no evidence of CNS event  Admission 01/23/2007 and with nausea and vomiting secondary to adrenal insufficiency and crisis and gastroenteritis precipitating adrenal crisis with steroid-dependent rheumatoid arthritis  admitted 08/19/2007 for a left total knee arthroplasty  Admitted 08/27/2007 Enterobacter UTI and sepsis with abnormal liver function at that time  Admitted 07/06/2010 with non-ST segment elevation MI and nonobstructive ruptured plaque in the LAD-she was not candidate for stenting and has a plaque was nonobstructive given the fact that she had a high risk for recurrent GI bleed in setting of dual antiplatelet therapy it was decided that cardiology at that time to continue medical therapy and she is treated with aspirin and presented initially  Admission 05/10/2011 with infrascapular back pain-she  underwent lexis and Cardiolite study showing no evidence of dysfunction  Admission 12/01/2011 with acute left subcortical lacunar infarct corpus callosum with dysarthria-at that time she had carotid Dopplers which were nonrevealing normal echocardiogram MRI showing what was noted earlier her aspirin is advanced to full dose 325 mg given she has poor tolerance to Plavix-she also had another MRI during that admission showing new A. of restricted diffusion in most posterior left medial frontal lobe felt to call for weakness of right extremity and stable infarct corpus callosum left singulate gyrus  Last admission was 03/16/2012 for nausea vomiting and septic shock and she was admitted by critical care medicine at that time   Consultants:  PCCM  Cardiology-[place]  Procedures:  Portable chest x-ray 04/01/12 = low lung volumes without acute findings, hiatal hernia and suspect chronic volume loss and atelectasis at left lung base   Antibiotics:  Cefepime 10/2  Zosyn 10/02  Vancomycin 10/02  Urine culture pending   Subjective  Looks and feels much better than yesterday.  Tol eat solid foods well No cp/n/v/sob No stool yet.  No SOB   Objective    Interim History: Low grade temps noted.  Telemetry: Sinus rhythm  Objective: Filed Vitals:   04/02/12 0000 04/02/12 0400 04/02/12 0700 04/02/12 0800  BP:   154/77 130/108  Pulse:   87 92  Temp: 98.2 F (36.8 C) 98.6 F (37 C) 99.1 F (37.3 C) 99.3 F (37.4 C)  TempSrc: Core (Comment) Core (Comment)    Resp:   18 17  Height:      Weight: 74.2 kg (163 lb 9.3 oz)     SpO2:   94% 95%    Intake/Output Summary (Last 24 hours) at 04/02/12  1610 Last data filed at 04/02/12 0600  Gross per 24 hour  Intake   3910 ml  Output   1226 ml  Net   2684 ml    Exam:  General:  alert and oriented. Eyes: No icterus or pallor, slightly slow speech, sclera has arcus senilis  ENT: Moderate dentition no JVD  Neck: No thyromegaly   Cardiovascular: S1-S2 tachycardic? Murmur  Respiratory: Clinically clear with no added sound  Abdomen: Soft nontender nondistended no rebound or guarding, no CVA tenderness   Data Reviewed: Basic Metabolic Panel:  Lab 04/01/12 9604 03/31/12 1400  NA 138 135  K 4.6 4.7  CL 103 100  CO2 23 23  GLUCOSE 178* 103*  BUN 31* 31*  CREATININE 1.25* 1.30*  CALCIUM 8.6 9.5  MG -- --  PHOS -- --   Liver Function Tests:  Lab 04/01/12 0324 03/31/12 1400  AST 16 16  ALT 15 15  ALKPHOS 51 59  BILITOT 0.3 0.3  PROT 5.6* 6.0  ALBUMIN 2.4* 2.8*   No results found for this basename: LIPASE:5,AMYLASE:5 in the last 168 hours No results found for this basename: AMMONIA:5 in the last 168 hours CBC:  Lab 04/02/12 0905 04/01/12 0324 03/31/12 1400  WBC 20.8* 17.9* 24.1*  NEUTROABS 19.2* -- 20.3*  HGB 8.3* 9.4* 10.6*  HCT 25.1* 29.8* 32.8*  MCV 84.8 86.1 85.0  PLT 210 216 258   Cardiac Enzymes:  Lab 04/01/12 1414 04/01/12 0720 04/01/12 0122 03/31/12 1945  CKTOTAL 209* 239* -- --  CKMB 8.9* 10.5* -- --  CKMBINDEX -- -- -- --  TROPONINI -- 0.39* 0.41* <0.30   BNP: No components found with this basename: POCBNP:5 CBG:  Lab 04/01/12 0756  GLUCAP 137*    Recent Results (from the past 240 hour(s))  CULTURE, BLOOD (ROUTINE X 2)     Status: Normal (Preliminary result)   Collection Time   03/31/12  2:00 PM      Component Value Range Status Comment   Specimen Description BLOOD LEFT ARM   Final    Special Requests BOTTLES DRAWN AEROBIC AND ANAEROBIC 5CC   Final    Culture  Setup Time 04/01/2012 01:22   Final    Culture     Final    Value: GRAM POSITIVE COCCI IN CLUSTERS     Note: Gram Stain Report Called to,Read Back By and Verified With: CRYSTAL MITCHELL 04/02/2012 2:07AM YIM[facility]   Report Status PENDING   Incomplete   CULTURE, BLOOD (ROUTINE X 2)     Status: Normal (Preliminary result)   Collection Time   03/31/12  2:10 PM      Component Value Range Status Comment   Specimen  Description BLOOD RIGHT ARM   Final    Special Requests BOTTLES DRAWN AEROBIC AND ANAEROBIC 5CC   Final    Culture  Setup Time 04/01/2012 01:23   Final    Culture     Final    Value:        BLOOD CULTURE RECEIVED NO GROWTH TO DATE CULTURE WILL BE HELD FOR 5 DAYS BEFORE ISSUING A FINAL NEGATIVE REPORT   Report Status PENDING   Incomplete   URINE CULTURE     Status: Normal   Collection Time   03/31/12  2:37 PM      Component Value Range Status Comment   Specimen Description URINE, CLEAN CATCH   Final    Special Requests NONE   Final    Culture  Setup Time 04/01/2012  07:27   Final    Colony Count NO GROWTH   Final    Culture NO GROWTH   Final    Report Status 04/02/2012 FINAL   Final   MRSA PCR SCREENING     Status: Normal   Collection Time   04/01/12  2:44 AM      Component Value Range Status Comment   MRSA by PCR NEGATIVE  NEGATIVE Final      Studies:              All Imaging reviewed and is as per above notation   Scheduled Meds:    . diazepam  5 mg Oral QHS  . levothyroxine  50 mcg Oral QAC breakfast  . metoprolol  50 mg Oral BID  . pantoprazole  40 mg Oral Daily  . piperacillin-tazobactam (ZOSYN)  IV  3.375 g Intravenous Q8H  . sertraline  50 mg Oral Daily  . sodium chloride  3 mL Intravenous Q12H  . vancomycin  1,000 mg Intravenous Q24H  . Warfarin - Pharmacist Dosing Inpatient   Does not apply q1800  . DISCONTD: hydrocortisone sod succinate (SOLU-CORTEF) injection  100 mg Intravenous Q8H  . DISCONTD: methylPREDNISolone (SOLU-MEDROL) injection  125 mg Intravenous Q6H   Continuous Infusions:    . sodium chloride 125 mL/hr at 04/01/12 2113     Assessment/Plan: Principal Problem:  *Shock  Active Problems:  Renal insufficiency  RA (rheumatoid arthritis)  GERD (gastroesophageal reflux disease)  Hiatal hernia  Depression  Anxiety  Protein-calorie malnutrition, moderate  Gait instability  Adrenal insufficiency   1. Probable septic shock-cover her broadly with  Vancomycin-Zosyn d/c 04/02/12. CCM signed off-Blood cult growing 1/2 bottles Gram + Cocci-if this turns out to be coag neg, would disregard and work-up with TEE/Hardware review-Daughter reports had a bad bed sore in 2 months in the ankle. 2. possible adrenal insufficiency contributing to #1-resolved.  Am cortisol was 25.0.  D/c IV Steroids and monitor-keep on Prednisone 20 mg and slow taper.  Will need f/u C Dr. Dierdre Forth as outpatient 3. Hiatal hernia and reflux disease-possible precipitant for finding seen today. She is presented with a similar fashion in the past-as she is not complaining of emesis or dark stool at present we will hold on further workup at this time-[facility]. 4. Elevated troponin-? NSTEMI-? Type II myocardial infarction-get stat EKG, cycle cardiac enzymes, troponin seems to be trending Appreicate Dr. Anne Fu input 04/01/12.  No further work-up 5. Elevated INR 6. Sinus tachycardia-likely rebound effect of withdrawal of beta blocker as she has not received any medications today per nursing home documentation-we will cautiously reimplement beta blocker with hold parameters. 7. Relative hypoxia her SpO2 was 90% range and dropped to 88% off of a. She does not use oxygen at home. I have a relatively high threshold to get a CT of the chest-INr supratherapeutic 8. Chronic kidney disease stage 2-3-her BUN/creatinine are baseline at present time 9. Leukocytosis-likely secondary to infectious process versus chronic steroid therapy.  10. Rheumatoid arthritis-sees Dr. Dierdre Forth for the same. We will monitor currently at this time. Continue stress dose steroids as Solu-Medrol can potentially transitioned to prednisone taper in one to 2 days   Code Status: full Family Communication: Updated [place] on cell phone. Disposition Plan: Step down   Pleas Koch, MD  [facility] Pager [phone] 04/02/2012, 9:29 AM    LOS: 2 days

## 2012-04-02 NOTE — Progress Notes (Signed)
Reviewed the chart and data. I agree with Dr. Anne Fu' note. ECG this AM is unchanged showing old IMI. Troponin levels have peaked. In absence of overt acute ischemia and or symptoms, no further cardiac w/u at this time.

## 2012-04-02 NOTE — Progress Notes (Signed)
Report called to R[place], RN; pt transferred to [phone]

## 2012-04-02 NOTE — Progress Notes (Signed)
Lab called and stated that pt's aerobic bottle showed  Gram + cocci with clusters. Called came from [facility]. Midlevel paged and informed awaiting call back.

## 2012-04-03 ENCOUNTER — Inpatient Hospital Stay (HOSPITAL_COMMUNITY): Payer: Medicare Other

## 2012-04-03 LAB — PROTIME-INR
INR: 2.98 — ABNORMAL HIGH (ref 0.00–1.49)
Prothrombin Time: 29.4 seconds — ABNORMAL HIGH (ref 11.6–15.2)

## 2012-04-03 LAB — CULTURE, BLOOD (ROUTINE X 2)

## 2012-04-03 LAB — BASIC METABOLIC PANEL
Chloride: 104 mEq/L (ref 96–112)
GFR calc Af Amer: 55 mL/min — ABNORMAL LOW (ref >90)
Potassium: 3.6 mEq/L (ref 3.5–5.1)

## 2012-04-03 LAB — VANCOMYCIN, TROUGH: Vancomycin Tr: 14.9 ug/mL (ref 10.0–20.0)

## 2012-04-03 LAB — CBC
Platelets: 233 10*3/uL (ref 150–400)
RDW: 15.7 % — ABNORMAL HIGH (ref 11.5–15.5)
WBC: 14.4 10*3/uL — ABNORMAL HIGH (ref 4.0–10.5)

## 2012-04-03 LAB — CLOSTRIDIUM DIFFICILE BY PCR: Toxigenic C. Difficile by PCR: NEGATIVE

## 2012-04-03 LAB — GLUCOSE, CAPILLARY: Glucose-Capillary: 75 mg/dL (ref 70–99)

## 2012-04-03 LAB — C-REACTIVE PROTEIN: CRP: 4.3 mg/dL — ABNORMAL HIGH (ref <0.60)

## 2012-04-03 MED ORDER — WARFARIN SODIUM 1 MG PO TABS
1.0000 mg | ORAL_TABLET | Freq: Once | ORAL | Status: AC
  Administered 2012-04-03: 1 mg via ORAL
  Filled 2012-04-03: qty 1

## 2012-04-03 MED ORDER — LISINOPRIL 10 MG PO TABS
10.0000 mg | ORAL_TABLET | Freq: Every day | ORAL | Status: DC
  Administered 2012-04-03 – 2012-04-05 (×3): 10 mg via ORAL
  Filled 2012-04-03 (×3): qty 1

## 2012-04-03 MED ORDER — HYDRALAZINE HCL 20 MG/ML IJ SOLN
10.0000 mg | INTRAMUSCULAR | Status: DC | PRN
  Administered 2012-04-03: 10 mg via INTRAVENOUS
  Filled 2012-04-03: qty 1

## 2012-04-03 MED ORDER — PANTOPRAZOLE SODIUM 40 MG PO TBEC
40.0000 mg | DELAYED_RELEASE_TABLET | Freq: Two times a day (BID) | ORAL | Status: DC
  Administered 2012-04-03 – 2012-04-05 (×4): 40 mg via ORAL
  Filled 2012-04-03 (×8): qty 1

## 2012-04-03 MED ORDER — HYDROCODONE-ACETAMINOPHEN 5-325 MG PO TABS
1.0000 | ORAL_TABLET | Freq: Four times a day (QID) | ORAL | Status: DC | PRN
  Administered 2012-04-04 (×2): 1 via ORAL
  Filled 2012-04-03 (×2): qty 1

## 2012-04-03 NOTE — Progress Notes (Signed)
Dr. Mahala Menghini aware of increased bp this a.m.,.  Informed bp retaken at approx. 1914 and 193/84 result.  Stated to go ahead and give routine a.m. Dose of lopressor and will add lisinopril .

## 2012-04-03 NOTE — Progress Notes (Signed)
Notified Dr. Izola Price (only MD on call for team 4 at this time) about pt's high BP.  Dinamap BP was 196/104 and manual was 210/100.  No PRN BP meds to administer and no new orders, instructed to wait until Dr. Mahala Menghini is on call at 0800 am since he is the pt's Attending MD.  Will continue to monitor pt's BP, relayed to dayshift RN.

## 2012-04-03 NOTE — Consult Note (Signed)
Reason for Consult:fever unknown origin Referring Physician: Dr. Clearence Cheek [name] an 78 y.o. female.  HPI: [name] an 78 year old female with multiple medical comorbidities who was in her usual state of reasonable health when she was admitted [facility] and 3 days ago with sepsis. She presented with elevated white count low-blood pressure and tachycardia at that time her septic shock has resolved on antibiotics but a source remains elusive despite extensive workup. Consultation [name] made to evaluate an orthopedic source of her potential sepsis several days ago. [name] has had ankle fusion on Samantha right along with bilateral knee replacements done by Dr. Cleophas Dunker and back fusion done at [facility]. She denies any ankle pain knee pain and states she has sporadic back pain but it's nothing significant. She denies any  joint symptoms. She's followed by Dr. Lajean Silvius who has her on a steroid taper. [name] does take occasional hydrocodone a time and has requested that be filled here as well.  Past Medical History  Diagnosis Date  . SOB (shortness of breath)   . MI (myocardial infarction)   . Poor appetite   . Arthritis   . Fatigue   . Right ear pain   . Renal insufficiency   . RA (rheumatoid arthritis)   . PUD (peptic ulcer disease)   . GI bleed 2005  . GERD (gastroesophageal reflux disease)   . Hiatal hernia   . Hypothyroidism   . Osteoporosis   . Depression   . Anxiety   . Coronary artery disease   . CVA (cerebral vascular accident)   . TIA (transient ischemic attack)   . Fall   . DVT of lower extremity (deep venous thrombosis)     Past Surgical History  Procedure Date  . Cardiac catheterization     SHOWED RUPTURE PLAQUE IN Samantha LAD. Samantha LAD [name] NONOBSTRUCTIVE WITH ONLY 30-40% STENOSIS  . Nstemi 06/2010  . Spinal fusion surgery   . Knee surgery     Family History  Problem Relation Age of Onset  . Kidney disease Mother   . Kidney disease Brother     Social  History:  reports that she has never smoked. She has never used smokeless tobacco. She reports that she does not drink alcohol or use illicit drugs.  Allergies:  Allergies  Allergen Reactions  . Codeine     sick  . Morphine And Related Other (See Comments)    unknown  . Percocet (Oxycodone-Acetaminophen)     unknown  . Plavix (Clopidogrel Bisulfate) Other (See Comments)    unknown  . Sulfur Other (See Comments)    unknown    Medications: I have reviewed [name]'s current medications.  Results for orders placed during Samantha hospital encounter of 03/31/12 (from Samantha past 48 hour(s))  PROTIME-INR     Status: Abnormal   Collection Time   04/02/12  3:10 AM      Component Value Range Comment   Prothrombin Time 45.2 (*) 11.6 - 15.2 seconds    INR 5.32 (*) 0.00 - 1.49   GLUCOSE, CAPILLARY     Status: Abnormal   Collection Time   04/02/12  8:25 AM      Component Value Range Comment   Glucose-Capillary 130 (*) 70 - 99 mg/dL   CBC WITH DIFFERENTIAL     Status: Abnormal   Collection Time   04/02/12  9:05 AM      Component Value Range Comment   WBC 20.8 (*)  4.0 - 10.5 K/uL    RBC 2.96 (*) 3.87 - 5.11 MIL/uL    Hemoglobin 8.3 (*) 12.0 - 15.0 g/dL    HCT 16.1 (*) 09.6 - 46.0 %    MCV 84.8  78.0 - 100.0 fL    MCH 28.0  26.0 - 34.0 pg    MCHC 33.1  30.0 - 36.0 g/dL    RDW 04.5 (*) 40.9 - 15.5 %    Platelets 210  150 - 400 K/uL    Neutrophils Relative 92 (*) 43 - 77 %    Neutro Abs 19.2 (*) 1.7 - 7.7 K/uL    Lymphocytes Relative 3 (*) 12 - 46 %    Lymphs Abs 0.6 (*) 0.7 - 4.0 K/uL    Monocytes Relative 5  3 - 12 %    Monocytes Absolute 1.0  0.1 - 1.0 K/uL    Eosinophils Relative 0  0 - 5 %    Eosinophils Absolute 0.0  0.0 - 0.7 K/uL    Basophils Relative 0  0 - 1 %    Basophils Absolute 0.0  0.0 - 0.1 K/uL   PROTIME-INR     Status: Abnormal   Collection Time   04/03/12  5:02 AM      Component Value Range Comment   Prothrombin Time 29.4 (*) 11.6 - 15.2 seconds    INR 2.98 (*)  0.00 - 1.49   GLUCOSE, CAPILLARY     Status: Normal   Collection Time   04/03/12  7:37 AM      Component Value Range Comment   Glucose-Capillary 75  70 - 99 mg/dL   CBC     Status: Abnormal   Collection Time   04/03/12  9:41 AM      Component Value Range Comment   WBC 14.4 (*) 4.0 - 10.5 K/uL    RBC 3.30 (*) 3.87 - 5.11 MIL/uL    Hemoglobin 9.1 (*) 12.0 - 15.0 g/dL    HCT 81.1 (*) 91.4 - 46.0 %    MCV 85.2  78.0 - 100.0 fL    MCH 27.6  26.0 - 34.0 pg    MCHC 32.4  30.0 - 36.0 g/dL    RDW 78.2 (*) 95.6 - 15.5 %    Platelets 233  150 - 400 K/uL   BASIC METABOLIC PANEL     Status: Abnormal   Collection Time   04/03/12  9:41 AM      Component Value Range Comment   Sodium 140  135 - 145 mEq/L    Potassium 3.6  3.5 - 5.1 mEq/L    Chloride 104  96 - 112 mEq/L    CO2 27  19 - 32 mEq/L    Glucose, Bld 76  70 - 99 mg/dL    BUN 23  6 - 23 mg/dL    Creatinine, Ser 2.13  0.50 - 1.10 mg/dL    Calcium 8.8  8.4 - 08.6 mg/dL    GFR calc non Af Amer 48 (*) >90 mL/min    GFR calc Af Amer 55 (*) >90 mL/min   CLOSTRIDIUM DIFFICILE BY PCR     Status: Normal   Collection Time   04/03/12  2:54 PM      Component Value Range Comment   C difficile by pcr NEGATIVE  NEGATIVE   VANCOMYCIN, TROUGH     Status: Normal   Collection Time   04/03/12  6:59 PM      Component Value Range Comment  Vancomycin Tr 14.9  10.0 - 20.0 ug/mL     Dg Knee 1-2 Views Right  04/03/2012  *RADIOLOGY REPORT*  Clinical Data: Pain.  RIGHT KNEE - 1-2 VIEW  Comparison: None.  Findings: There has been previous total knee arthroplasty.  No joint effusion visible.  No evidence of loosening or other complication.  Arterial calcification incidentally noted.  IMPRESSION: Good appearance following total knee arthroplasty.   Original Report Authenticated By: Thomasenia Sales, M.D.    Dg Ankle 2 Views Right  04/03/2012  *RADIOLOGY REPORT*  Clinical Data: Pain  RIGHT ANKLE - 2 VIEW  Comparison: None.  Findings: [name] has had previous  subtalar fusion with placement of two screws across Samantha subtalar joint.  Fusion appears solid.  Tibiotalar articulation remains fairly normal.  There are osteoarthritic changes in Samantha midfoot.  No evidence of fracture. No plain radiographic evidence by osteomyelitis.  IMPRESSION: Previous hindfoot fusion.  No acute finding.  See above.   Original Report Authenticated By: Thomasenia Sales, M.D.     Review of Systems  Constitutional: Positive for fever and chills.  HENT: Negative.   Eyes: Negative.   Respiratory: Negative.   Cardiovascular: Negative.   Gastrointestinal: Positive for diarrhea.  Genitourinary: Negative.   Musculoskeletal: Negative.   Skin: Negative.   Neurological: Negative.   Endo/Heme/Allergies: Negative.   Psychiatric/Behavioral: Negative.    Blood pressure 183/78, pulse 86, temperature 98.1 F (36.7 C), temperature source Oral, resp. rate 18, height 5\' 5"  (1.651 m), weight 74.3 kg (163 lb 12.8 oz), SpO2 96.00%. Physical Exam  Constitutional: She appears well-developed.  HENT:  Head: Normocephalic.  Eyes: Pupils are equal, round, and reactive to light.  Neck: Normal range of motion.  Cardiovascular: Normal rate.   Respiratory: Effort normal.  Neurological: She [name] alert.  Skin: Skin [name] warm.   [name] has bilateral anterior knee incisions. There [name] no effusion in either knee. Range of motion [name] nontender in both knees. Extensor mechanism [name] intact bilaterally. No groin pain with internal extra rotation of Samantha legs. No nerve retention signs. Right ankle [name] examined. Pedal pulses palpable. [name] has decreased hindfoot motion consistent with her fusion. Incisions in these area are intact. Slight redness on Samantha heel consistent with early pressure but no definite areas of drainage are full thickness breakdown.  Assessment/Plan: Impression [name] sepsis without definite orthopedic localization. [name] really doesn't have any axial lower extremity pain which would create this  septic shock. Radiographs of Samantha right knee and ankle are unremarkable. I would like to obtain radiographs of Samantha left knee and of Samantha TLS spine. [name] does have a low albumin which would increase her risk for infection. General he [name] continuing to her immunosuppressed state. Do not see a definite orthopedic calls to Samantha resolve septic shock [name] presented with 3 days ago. Unless will complete Samantha workup with plain radiographs of Samantha knee and back.  DEAN,GREGORY SCOTT 04/03/2012, 8:29 PM

## 2012-04-03 NOTE — Progress Notes (Signed)
c-diff negative.  Stated she did not want to try to dangle at bedside this afternoon. Didn't "feel like it".  Med for bp 183/78.

## 2012-04-03 NOTE — Progress Notes (Signed)
ANTICOAGULATION CONSULT NOTE - Follow up  Pharmacy Consult for Warfarin Indication: VTE  Patient Measurements: Height: 5\' 5"  (165.1 cm) Weight: 163 lb 12.8 oz (74.3 kg) IBW/kg (Calculated) : 57   Vital Signs: Temp: 98.2 F (36.8 C) (10/05 1610) Temp src: Other (Comment) (10/05 9604) BP: 210/100 mmHg (10/05 0655) Pulse Rate: 80  (10/05 0638)  Labs:  Alvira Philips 04/03/12 0502 04/02/12 0905 04/02/12 0310 04/01/12 1414 04/01/12 0720 04/01/12 0324 04/01/12 0122 03/31/12 1945 03/31/12 1400  HGB -- 8.3* -- -- -- 9.4* -- -- --  HCT -- 25.1* -- -- -- 29.8* -- -- 32.8*  PLT -- 210 -- -- -- 216 -- -- 258  APTT -- -- -- -- -- -- -- -- --  LABPROT 29.4* -- 45.2* -- -- 29.6* -- -- --  INR 2.98* -- 5.32* -- -- 3.01* -- -- --  HEPARINUNFRC -- -- -- -- -- -- -- -- --  CREATININE -- -- -- -- -- 1.25* -- -- 1.30*  CKTOTAL -- -- -- 209* 239* -- -- -- --  CKMB -- -- -- 8.9* 10.5* -- -- -- --  TROPONINI -- -- -- -- 0.39* -- 0.41* <0.30 --   Estimated Creatinine Clearance: 36.2 ml/min (by C-G formula based on Cr of 1.25).  Medications:  Scheduled:     . diazepam  5 mg Oral QHS  . levothyroxine  50 mcg Oral QAC breakfast  . lisinopril  10 mg Oral Daily  . metoprolol  50 mg Oral BID  . pantoprazole  40 mg Oral Daily  . sertraline  50 mg Oral Daily  . sodium chloride  3 mL Intravenous Q12H  . vancomycin  1,000 mg Intravenous Q24H  . Warfarin - Pharmacist Dosing Inpatient   Does not apply q1800  . DISCONTD: piperacillin-tazobactam (ZOSYN)  IV  3.375 g Intravenous Q8H   Assessment:  82 YOF admitted with suspected pna, on warfarin for hx of recent DVT.  PTA warfarin dose was 1.5mg  PO daily.  INR in goal range today (previously supratherapeutic from 10/3)  Broad-spectrum abx can increase response to warfarin.   Hx GIB, GERD noted. On PPI.  Hgb decreased. No bleeding reported/documented. Per MD, anemia is from chronic disease.    Goal of Therapy:  INR 2-3   Plan:   Can resume  Warfarin today with 1mg  dose.  F/u PT/INR daily.  Otho Bellows  PharmD, Pager [phone] 04/03/2012,8:44 AM.

## 2012-04-03 NOTE — Progress Notes (Signed)
PROGRESS NOTE  [name]:[id number] DOB: [birth year] DOA: 03/31/2012 PCP: Oneal Grout, MD  Brief narrative: 78 yr old CF admitted with potentiatial sepsis, likely of urinary cause with hypotension and tachycardia in a setting of chronic immunosuppression with steroids for RA  Past medical history-As per Problem list Chart review  Rheumatoid arthritis diagnosed at age 69  History of right total knee arthroplasty 10/08/2000  Reported history of 3 breast biopsies  Reported history of remote CVA which affected right side of her brain left side of her body and was managed by Dr. Anne Hahn for this unclear as to when this occurred  Hysterectomy 1980, rectocele repair 1980  Complicated back surgery and fusion done in the past at [facility]  Admission 09/09/2003 for symptomatic normocytic anemia-EGD done at that time showed prepyloric antral ulcer in with no visible vessel-she was scoped by Dr. Evette Cristal at that time of [facility]  Admission 01/23/2004 for right subpatellar arthritis-had surgery at that time  Admission 09/17/2004 with vertigo secondary to possible labyrinthitis with no evidence of CNS event  Admission 01/23/2007 and with nausea and vomiting secondary to adrenal insufficiency and crisis and gastroenteritis precipitating adrenal crisis with steroid-dependent rheumatoid arthritis  admitted 08/19/2007 for a left total knee arthroplasty  Admitted 08/27/2007 Enterobacter UTI and sepsis with abnormal liver function at that time  Admitted 07/06/2010 with non-ST segment elevation MI and nonobstructive ruptured plaque in the LAD-she was not candidate for stenting and has a plaque was nonobstructive given the fact that she had a high risk for recurrent GI bleed in setting of dual antiplatelet therapy it was decided that cardiology at that time to continue medical therapy and she is treated with aspirin and presented initially  Admission 05/10/2011 with infrascapular back pain-she  underwent lexis and Cardiolite study showing no evidence of dysfunction  Admission 12/01/2011 with acute left subcortical lacunar infarct corpus callosum with dysarthria-at that time she had carotid Dopplers which were nonrevealing normal echocardiogram MRI showing what was noted earlier her aspirin is advanced to full dose 325 mg given she has poor tolerance to Plavix-she also had another MRI during that admission showing new A. of restricted diffusion in most posterior left medial frontal lobe felt to call for weakness of right extremity and stable infarct corpus callosum left singulate gyrus  Last admission was 03/16/2012 for nausea vomiting and septic shock and she was admitted by critical care medicine at that time   Consultants:  PCCM  Cardiology-[facility]  Orthopedics-Dr Renato Gails   Procedures:  Portable chest x-ray 03/31/12 = low lung volumes without acute findings, hiatal hernia and suspect chronic volume loss and atelectasis at left lung base   Antibiotics:  Cefepime 10/2  Zosyn 10/02  Vancomycin 10/02  Urine culture NG   Subjective  Has nausea and states she has had 4 episodes of loose stools-nursing reports only 1 loose formed stool today No cp/n/v/sob  A little anxious today Doesn't;t feel as good today Worried about her hi blood pressure   Objective    Interim History: Low grade temps noted.  Telemetry: Sinus rhythm  Objective: Filed Vitals:   04/02/12 2123 04/03/12 0638 04/03/12 0655 04/03/12 1042  BP: 178/80 196/104 210/100 191/87  Pulse: 82 80  77  Temp: 98.2 F (36.8 C) 98.2 F (36.8 C)  97.9 F (36.6 C)  TempSrc: Oral Other (Comment)  Oral  Resp: 20 16  18   Height:      Weight:  74.3 kg (163 lb 12.8 oz)  SpO2: 95% 96%  96%    Intake/Output Summary (Last 24 hours) at 04/03/12 1414 Last data filed at 04/03/12 0742  Gross per 24 hour  Intake    660 ml  Output   1903 ml  Net  -1243 ml    Exam:  General:  alert and oriented. Eyes: No  icterus or pallor, slightly slow speech, sclera has arcus senilis  ENT: Moderate dentition no JVD  Neck: No thyromegaly  Cardiovascular: S1-S2 tachycardic? Murmur  Respiratory: Clinically clear with no added sound  Abdomen: Soft nontender nondistended no rebound or guarding, no CVA tenderness   Data Reviewed: Basic Metabolic Panel:  Lab 04/03/12 5621 04/01/12 0324 03/31/12 1400  NA 140 138 135  K 3.6 4.6 --  CL 104 103 100  CO2 27 23 23   GLUCOSE 76 178* 103*  BUN 23 31* 31*  CREATININE 1.07 1.25* 1.30*  CALCIUM 8.8 8.6 9.5  MG -- -- --  PHOS -- -- --   Liver Function Tests:  Lab 04/01/12 0324 03/31/12 1400  AST 16 16  ALT 15 15  ALKPHOS 51 59  BILITOT 0.3 0.3  PROT 5.6* 6.0  ALBUMIN 2.4* 2.8*   No results found for this basename: LIPASE:5,AMYLASE:5 in the last 168 hours No results found for this basename: AMMONIA:5 in the last 168 hours CBC:  Lab 04/03/12 0941 04/02/12 0905 04/01/12 0324 03/31/12 1400  WBC 14.4* 20.8* 17.9* 24.1*  NEUTROABS -- 19.2* -- 20.3*  HGB 9.1* 8.3* 9.4* 10.6*  HCT 28.1* 25.1* 29.8* 32.8*  MCV 85.2 84.8 86.1 85.0  PLT 233 210 216 258   Cardiac Enzymes:  Lab 04/01/12 1414 04/01/12 0720 04/01/12 0122 03/31/12 1945  CKTOTAL 209* 239* -- --  CKMB 8.9* 10.5* -- --  CKMBINDEX -- -- -- --  TROPONINI -- 0.39* 0.41* <0.30   BNP: No components found with this basename: POCBNP:5 CBG:  Lab 04/03/12 0737 04/02/12 0825 04/01/12 0756  GLUCAP 75 130* 137*    Recent Results (from the past 240 hour(s))  CULTURE, BLOOD (ROUTINE X 2)     Status: Normal   Collection Time   03/31/12  2:00 PM      Component Value Range Status Comment   Specimen Description BLOOD LEFT ARM   Final    Special Requests BOTTLES DRAWN AEROBIC AND ANAEROBIC 5CC   Final    Culture  Setup Time 04/01/2012 01:22   Final    Culture     Final    Value: STAPHYLOCOCCUS SPECIES (COAGULASE NEGATIVE)     Note: THE SIGNIFICANCE OF ISOLATING THIS ORGANISM FROM A SINGLE SET OF BLOOD  CULTURES WHEN MULTIPLE SETS ARE DRAWN IS UNCERTAIN. PLEASE NOTIFY THE MICROBIOLOGY DEPARTMENT WITHIN ONE WEEK IF SPECIATION AND SENSITIVITIES ARE REQUIRED.     Note: Gram Stain Report Called to,Read Back By and Verified With: CRYSTAL MITCHELL 04/02/2012 2:07AM YIMSU   Report Status 04/03/2012 FINAL   Final   CULTURE, BLOOD (ROUTINE X 2)     Status: Normal (Preliminary result)   Collection Time   03/31/12  2:10 PM      Component Value Range Status Comment   Specimen Description BLOOD RIGHT ARM   Final    Special Requests BOTTLES DRAWN AEROBIC AND ANAEROBIC 5CC   Final    Culture  Setup Time 04/01/2012 01:23   Final    Culture     Final    Value:        BLOOD CULTURE RECEIVED NO GROWTH TO DATE  CULTURE WILL BE HELD FOR 5 DAYS BEFORE ISSUING A FINAL NEGATIVE REPORT   Report Status PENDING   Incomplete   URINE CULTURE     Status: Normal   Collection Time   03/31/12  2:37 PM      Component Value Range Status Comment   Specimen Description URINE, CLEAN CATCH   Final    Special Requests NONE   Final    Culture  Setup Time 04/01/2012 07:27   Final    Colony Count NO GROWTH   Final    Culture NO GROWTH   Final    Report Status 04/02/2012 FINAL   Final   MRSA PCR SCREENING     Status: Normal   Collection Time   04/01/12  2:44 AM      Component Value Range Status Comment   MRSA by PCR NEGATIVE  NEGATIVE Final      Studies:              All Imaging reviewed and is as per above notation   Scheduled Meds:    . diazepam  5 mg Oral QHS  . levothyroxine  50 mcg Oral QAC breakfast  . lisinopril  10 mg Oral Daily  . metoprolol  50 mg Oral BID  . pantoprazole  40 mg Oral Daily  . sertraline  50 mg Oral Daily  . sodium chloride  3 mL Intravenous Q12H  . vancomycin  1,000 mg Intravenous Q24H  . warfarin  1 mg Oral ONCE-1800  . Warfarin - Pharmacist Dosing Inpatient   Does not apply q1800   Continuous Infusions:     Assessment/Plan: Principal Problem:  *Shock  Active Problems:  Renal  insufficiency  RA (rheumatoid arthritis)  GERD (gastroesophageal reflux disease)  Hiatal hernia  Depression  Anxiety  Protein-calorie malnutrition, moderate  Gait instability  Adrenal insufficiency   1. Probable septic shock-resolved-initally broadly with Vancomycin-Zosyn d/c 04/02/12. CCM signed off-Blood cult growing 1/2 bottles Gram + Cocci-coag neg, would disregard.  Daughter reports had a bad bed sore  in R ankle.  I spoke with Orthopedics Dr. Renato Gails who will graciously review the patient 10/5 after Knee and ankle xrays are done-TTE showed no vegetation 2. Possible adrenal insufficiency contributing to #1-resolved.  Am cortisol was 25.0.  D/c IV Steroids and monitor-keep on Prednisone 20 mg and slow taper.  Will need f/u C Dr. Dierdre Forth as outpatient 3. ?Diarrhoea-get fecal lactoferrin and CDIFF PCR ONLY IF SHE HAS diarrhea documented 4. Hiatal hernia and reflux disease-possible precipitant. She is presented with a similar fashion in the past-as she is not complaining of emesis or dark stool at present we will hold on further workup at this time-[facility]ology group is [facility]. 5. Elevated troponin-? NSTEMI-? Type II myocardial infarction-get stat EKG, cycle cardiac enzymes, troponin seems to be trending Appreicate Dr. Anne Fu input 04/01/12.  Echocardiogram showed no vegetations 6. Mild Mitral regurgitation-stable 7. DVT-continue coumadin per pharmacy 8. Elevated INR-down to 2.28 today 9. Htn-blood pressure poorly controlled.  Re-implemented Lisinopril @ 10 mg 10/413.  Added prn hydralazine 10 mg for bp above 180/100 10. Sinus tachycardia-likely rebound effect of withdrawal of beta blocker as she has not received any medications today per nursing home documentation-we will cautiously reimplement beta blocker with hold parameters. 11. Relative hypoxia her SpO2 was 90% range and dropped to 88% off of it. She does not use oxygen at home. I have a relatively high threshold to get a CT of the  chest-INr supratherapeutic 12. Chronic kidney  disease stage 2-3-her BUN/creatinine are baseline at present time 13. Leukocytosis-likely secondary to infectious process versus chronic steroid therapy.  14. HLD-hold Crestor at this time until d/c home 15. Rheumatoid arthritis-sees Dr. Dierdre Forth for the same. We will monitor currently at this time. contineu prednisone taper   Code Status: full Family Communication: Updated Alicea on cell phone. Disposition Plan: Tele   Pleas Koch, MD  [facility] Pager [phone] 04/03/2012, 2:14 PM    LOS: 3 days

## 2012-04-03 NOTE — Progress Notes (Signed)
ANTIBIOTIC CONSULT NOTE - FOLLOW UP  Pharmacy Consult for Vancomycin Indication: pneumonia  Allergies  Allergen Reactions  . Codeine     sick  . Morphine And Related Other (See Comments)    unknown  . Percocet (Oxycodone-Acetaminophen)     unknown  . Plavix (Clopidogrel Bisulfate) Other (See Comments)    unknown  . Sulfur Other (See Comments)    unknown   Patient Measurements: Height: 5\' 5"  (165.1 cm) Weight: 163 lb 12.8 oz (74.3 kg) IBW/kg (Calculated) : 57   Vital Signs: Temp: 98.2 F (36.8 C) (10/05 1610) Temp src: Other (Comment) (10/05 9604) BP: 210/100 mmHg (10/05 0655) Pulse Rate: 80  (10/05 5409) Intake/Output from previous day: 10/04 0701 - 10/05 0700 In: 1368.8 [P.O.:700; I.V.:468.8; IV Piggyback:200] Out: 1902 [Urine:1900; Stool:2] Intake/Output from this shift: Total I/O In: -  Out: 1 [Stool:1]  Labs:  [facility] 04/02/12 0905 04/01/12 0324 03/31/12 1400  WBC 20.8* 17.9* 24.1*  HGB 8.3* 9.4* 10.6*  PLT 210 216 258  LABCREA -- -- --  CREATININE -- 1.25* 1.30*   Estimated Creatinine Clearance: 36.2 ml/min (by C-G formula based on Cr of 1.25). No results found for this basename: VANCOTROUGH:2,VANCOPEAK:2,VANCORANDOM:2,GENTTROUGH:2,GENTPEAK:2,GENTRANDOM:2,TOBRATROUGH:2,TOBRAPEAK:2,TOBRARND:2,AMIKACINPEAK:2,AMIKACINTROU:2,AMIKACIN:2, in the last 72 hours   Microbiology: Recent Results (from the past 720 hour(s))  CULTURE, BLOOD (ROUTINE X 2)     Status: Normal   Collection Time   03/16/12  2:00 PM      Component Value Range Status Comment   Specimen Description BLOOD LEFT ARM   Final    Special Requests BOTTLES DRAWN AEROBIC AND ANAEROBIC   Final    Culture  Setup Time 03/17/2012 01:08   Final    Culture NO GROWTH 5 DAYS   Final    Report Status 03/23/2012 FINAL   Final   CULTURE, BLOOD (ROUTINE X 2)     Status: Normal   Collection Time   03/16/12  2:05 PM      Component Value Range Status Comment   Specimen Description BLOOD LEFT ARM   Final      Special Requests BOTTLES DRAWN AEROBIC ONLY   Final    Culture  Setup Time 03/17/2012 01:08   Final    Culture NO GROWTH 5 DAYS   Final    Report Status 03/23/2012 FINAL   Final   URINE CULTURE     Status: Normal   Collection Time   03/16/12  2:26 PM      Component Value Range Status Comment   Specimen Description URINE, CATHETERIZED   Final    Special Requests NONE   Final    Culture  Setup Time 03/17/2012 01:17   Final    Colony Count NO GROWTH   Final    Culture NO GROWTH   Final    Report Status 03/18/2012 FINAL   Final   MRSA PCR SCREENING     Status: Normal   Collection Time   03/16/12  6:56 PM      Component Value Range Status Comment   MRSA by PCR NEGATIVE  NEGATIVE Final   CULTURE, BLOOD (ROUTINE X 2)     Status: Normal (Preliminary result)   Collection Time   03/31/12  2:00 PM      Component Value Range Status Comment   Specimen Description BLOOD LEFT ARM   Final    Special Requests BOTTLES DRAWN AEROBIC AND ANAEROBIC 5CC   Final    Culture  Setup Time 04/01/2012 01:22   Final  Culture     Final    Value: GRAM POSITIVE COCCI IN CLUSTERS     Note: Gram Stain Report Called to,Read Back By and Verified With: CRYSTAL MITCHELL 04/02/2012 2:07AM YIMSU   Report Status PENDING   Incomplete   CULTURE, BLOOD (ROUTINE X 2)     Status: Normal (Preliminary result)   Collection Time   03/31/12  2:10 PM      Component Value Range Status Comment   Specimen Description BLOOD RIGHT ARM   Final    Special Requests BOTTLES DRAWN AEROBIC AND ANAEROBIC 5CC   Final    Culture  Setup Time 04/01/2012 01:23   Final    Culture     Final    Value:        BLOOD CULTURE RECEIVED NO GROWTH TO DATE CULTURE WILL BE HELD FOR 5 DAYS BEFORE ISSUING A FINAL NEGATIVE REPORT   Report Status PENDING   Incomplete   URINE CULTURE     Status: Normal   Collection Time   03/31/12  2:37 PM      Component Value Range Status Comment   Specimen Description URINE, CLEAN CATCH   Final    Special Requests  NONE   Final    Culture  Setup Time 04/01/2012 07:27   Final    Colony Count NO GROWTH   Final    Culture NO GROWTH   Final    Report Status 04/02/2012 FINAL   Final   MRSA PCR SCREENING     Status: Normal   Collection Time   04/01/12  2:44 AM      Component Value Range Status Comment   MRSA by PCR NEGATIVE  NEGATIVE Final     Anti-infectives     Start     Dose/Rate Route Frequency Ordered Stop   03/31/12 2000   piperacillin-tazobactam (ZOSYN) IVPB 3.375 g  Status:  Discontinued        3.375 g 12.5 mL/hr over 240 Minutes Intravenous 3 times per day 03/31/12 1950 04/02/12 0939   03/31/12 2000   vancomycin (VANCOCIN) IVPB 1000 mg/200 mL premix        1,000 mg 200 mL/hr over 60 Minutes Intravenous Every 24 hours 03/31/12 1950     03/31/12 1515   cefTRIAXone (ROCEPHIN) 1 g in dextrose 5 % 50 mL IVPB        1 g 100 mL/hr over 30 Minutes Intravenous  Once 03/31/12 1503 03/31/12 1603          Assessment:  Day 4 Vancomycin 1gm q24 in SNF patient  Urine culture no growth  Blood cultures: 1/2 with GPC, (consider may be a contaminant)  WBC remains elevated-pt was on steroids (discontinued due to rash thought secondary to IV steroids).  Goal of Therapy:  Vancomycin trough level 15-20 mcg/ml  Plan:  Continue Vancomycin 1gm q24 Trough tonight BMET in am.  Perlie Gold LPharmD Pager [phone] 04/03/2012,8:58 AM

## 2012-04-04 ENCOUNTER — Inpatient Hospital Stay (HOSPITAL_COMMUNITY): Payer: Medicare Other

## 2012-04-04 LAB — CBC WITH DIFFERENTIAL/PLATELET
Basophils Relative: 0 % (ref 0–1)
Eosinophils Absolute: 0.5 10*3/uL (ref 0.0–0.7)
Eosinophils Relative: 5 % (ref 0–5)
Hemoglobin: 9.9 g/dL — ABNORMAL LOW (ref 12.0–15.0)
Lymphs Abs: 2.1 10*3/uL (ref 0.7–4.0)
MCH: 26.6 pg (ref 26.0–34.0)
MCHC: 31.5 g/dL (ref 30.0–36.0)
MCV: 84.4 fL (ref 78.0–100.0)
Monocytes Relative: 8 % (ref 3–12)
Neutrophils Relative %: 67 % (ref 43–77)
RBC: 3.72 MIL/uL — ABNORMAL LOW (ref 3.87–5.11)

## 2012-04-04 LAB — BASIC METABOLIC PANEL
BUN: 19 mg/dL (ref 6–23)
CO2: 27 mEq/L (ref 19–32)
Calcium: 9 mg/dL (ref 8.4–10.5)
GFR calc non Af Amer: 52 mL/min — ABNORMAL LOW (ref >90)
Glucose, Bld: 77 mg/dL (ref 70–99)

## 2012-04-04 LAB — CBC
MCH: 26.7 pg (ref 26.0–34.0)
MCHC: 31.6 g/dL (ref 30.0–36.0)
MCV: 84.4 fL (ref 78.0–100.0)
Platelets: 228 10*3/uL (ref 150–400)
RBC: 3.6 MIL/uL — ABNORMAL LOW (ref 3.87–5.11)

## 2012-04-04 LAB — GLUCOSE, CAPILLARY: Glucose-Capillary: 80 mg/dL (ref 70–99)

## 2012-04-04 MED ORDER — WARFARIN SODIUM 2 MG PO TABS
2.0000 mg | ORAL_TABLET | Freq: Once | ORAL | Status: AC
  Administered 2012-04-04: 2 mg via ORAL
  Filled 2012-04-04: qty 1

## 2012-04-04 NOTE — Progress Notes (Signed)
PROGRESS NOTE  [name] ZOX:[id number] DOB: Apr 06, 1930 DOA: 03/31/2012 PCP: Oneal Grout, MD  Brief narrative: 78 yr old CF admitted with potentiatial sepsis, likely of urinary cause with hypotension and tachycardia in a setting of chronic immunosuppression with steroids for RA-patient was pancultured and kept on broad-spectrum antibiotics which were narrowed on 04/02/2012 given she grew one of 2 bottles in her blood culture of Coagulase negative Staphylococcus. She continued to improve but experienced bouts of nausea no overt diarrhea but rather loose stool.  Past medical history-As per Problem list Chart review  Rheumatoid arthritis diagnosed at age 29  History of right total knee arthroplasty 10/08/2000  Reported history of 3 breast biopsies  Reported history of remote CVA which affected right side of her brain left side of her body and was managed by Dr. Anne Hahn for this unclear as to when this occurred  Hysterectomy 1980, rectocele repair 1980  Complicated back surgery and fusion done in the past at [facility]  Admission 09/09/2003 for symptomatic normocytic anemia-EGD done at that time showed prepyloric antral ulcer in with no visible vessel-she was scoped by Dr. Evette Cristal at that time of [facility]  Admission 01/23/2004 for right subpatellar arthritis-had surgery at that time  Admission 09/17/2004 with vertigo secondary to possible labyrinthitis with no evidence of CNS event  Admission 01/23/2007 and with nausea and vomiting secondary to adrenal insufficiency and crisis and gastroenteritis precipitating adrenal crisis with steroid-dependent rheumatoid arthritis  admitted 08/19/2007 for a left total knee arthroplasty  Admitted 08/27/2007 Enterobacter UTI and sepsis with abnormal liver function at that time  Admitted 07/06/2010 with non-ST segment elevation MI and nonobstructive ruptured plaque in the LAD-she was not candidate for stenting and has a plaque was nonobstructive given  the fact that she had a high risk for recurrent GI bleed in setting of dual antiplatelet therapy it was decided that cardiology at that time to continue medical therapy and she is treated with aspirin and presented initially  Admission 05/10/2011 with infrascapular back pain-she underwent lexis and Cardiolite study showing no evidence of dysfunction  Admission 12/01/2011 with acute left subcortical lacunar infarct corpus callosum with dysarthria-at that time she had carotid Dopplers which were nonrevealing normal echocardiogram MRI showing what was noted earlier her aspirin is advanced to full dose 325 mg given she has poor tolerance to Plavix-she also had another MRI during that admission showing new A. of restricted diffusion in most posterior left medial frontal lobe felt to call for weakness of right extremity and stable infarct corpus callosum left singulate gyrus  Last admission was 03/16/2012 for nausea vomiting and septic shock and she was admitted by critical care medicine at that time   Consultants:  PCCM  Cardiology-Skains  Orthopedics-Dr Renato Gails   Telephone consulted ID Dr. Luciana Axe 04/04/12  Procedures:  Portable chest x-ray 03/31/12 = low lung volumes without acute findings, hiatal hernia and suspect chronic volume loss and atelectasis at left lung base   Antibiotics:  Cefepime 10/2  Zosyn 10/02  Vancomycin 10/02  Urine culture NG\  C. difficile is negative   Subjective  Looks and feels better multiple family members in the room No cp/n/v/sob  Denies any fever or chills   Objective    Interim History: Nursing reports loose stool but not diarrhea  Telemetry: Sinus rhythm  Objective: Filed Vitals:   04/03/12 2008 04/04/12 0627 04/04/12 1155 04/04/12 1415  BP: 151/66 172/85 155/73 148/73  Pulse: 81 78 85 80  Temp: 98.4 F (36.9 C)  98.3 F (36.8 C) 98.3 F (36.8 C) 98.6 F (37 C)  TempSrc: Oral Oral Oral Oral  Resp: 16 18  18   Height:      Weight:  69.1  kg (152 lb 5.4 oz)    SpO2: 95% 94%  96%    Intake/Output Summary (Last 24 hours) at 04/04/12 1608 Last data filed at 04/04/12 1500  Gross per 24 hour  Intake    360 ml  Output   2264 ml  Net  -1904 ml    Exam:  General:  alert and oriented. Eyes: No icterus or pallor, slightly slow speech, sclera has arcus senilis  ENT: Moderate dentition no JVD  Neck: No thyromegaly  Cardiovascular: S1-S2 tachycardic? Murmur  Respiratory: Clinically clear with no added sound  Abdomen: Soft nontender nondistended no rebound or guarding, no CVA tenderness   Data Reviewed: Basic Metabolic Panel:  Lab 04/04/12 4098 04/03/12 0941 04/01/12 0324 03/31/12 1400  NA 141 140 138 135  K 3.6 3.6 -- --  CL 103 104 103 100  CO2 27 27 23 23   GLUCOSE 77 76 178* 103*  BUN 19 23 31* 31*  CREATININE 1.00 1.07 1.25* 1.30*  CALCIUM 9.0 8.8 8.6 9.5  MG -- -- -- --  PHOS -- -- -- --   Liver Function Tests:  Lab 04/01/12 0324 03/31/12 1400  AST 16 16  ALT 15 15  ALKPHOS 51 59  BILITOT 0.3 0.3  PROT 5.6* 6.0  ALBUMIN 2.4* 2.8*   No results found for this basename: LIPASE:5,AMYLASE:5 in the last 168 hours No results found for this basename: AMMONIA:5 in the last 168 hours CBC:  Lab 04/04/12 0520 04/03/12 0941 04/02/12 0905 04/01/12 0324 03/31/12 1400  WBC 10.9* 14.4* 20.8* 17.9* 24.1*  NEUTROABS -- -- 19.2* -- 20.3*  HGB 9.6* 9.1* 8.3* 9.4* 10.6*  HCT 30.4* 28.1* 25.1* 29.8* 32.8*  MCV 84.4 85.2 84.8 86.1 85.0  PLT 228 233 210 216 258   Cardiac Enzymes:  Lab 04/01/12 1414 04/01/12 0720 04/01/12 0122 03/31/12 1945  CKTOTAL 209* 239* -- --  CKMB 8.9* 10.5* -- --  CKMBINDEX -- -- -- --  TROPONINI -- 0.39* 0.41* <0.30   BNP: No components found with this basename: POCBNP:5 CBG:  Lab 04/04/12 0754 04/03/12 0737 04/02/12 0825 04/01/12 0756  GLUCAP 80 75 130* 137*    Recent Results (from the past 240 hour(s))  CULTURE, BLOOD (ROUTINE X 2)     Status: Normal   Collection Time   03/31/12   2:00 PM      Component Value Range Status Comment   Specimen Description BLOOD LEFT ARM   Final    Special Requests BOTTLES DRAWN AEROBIC AND ANAEROBIC 5CC   Final    Culture  Setup Time 04/01/2012 01:22   Final    Culture     Final    Value: STAPHYLOCOCCUS SPECIES (COAGULASE NEGATIVE)     Note: THE SIGNIFICANCE OF ISOLATING THIS ORGANISM FROM A SINGLE SET OF BLOOD CULTURES WHEN MULTIPLE SETS ARE DRAWN IS UNCERTAIN. PLEASE NOTIFY THE MICROBIOLOGY DEPARTMENT WITHIN ONE WEEK IF SPECIATION AND SENSITIVITIES ARE REQUIRED.     Note: Gram Stain Report Called to,Read Back By and Verified With: CRYSTAL MITCHELL 04/02/2012 2:07AM YIMSU   Report Status 04/03/2012 FINAL   Final   CULTURE, BLOOD (ROUTINE X 2)     Status: Normal (Preliminary result)   Collection Time   03/31/12  2:10 PM      Component Value Range  Status Comment   Specimen Description BLOOD RIGHT ARM   Final    Special Requests BOTTLES DRAWN AEROBIC AND ANAEROBIC 5CC   Final    Culture  Setup Time 04/01/2012 01:23   Final    Culture     Final    Value:        BLOOD CULTURE RECEIVED NO GROWTH TO DATE CULTURE WILL BE HELD FOR 5 DAYS BEFORE ISSUING A FINAL NEGATIVE REPORT   Report Status PENDING   Incomplete   URINE CULTURE     Status: Normal   Collection Time   03/31/12  2:37 PM      Component Value Range Status Comment   Specimen Description URINE, CLEAN CATCH   Final    Special Requests NONE   Final    Culture  Setup Time 04/01/2012 07:27   Final    Colony Count NO GROWTH   Final    Culture NO GROWTH   Final    Report Status 04/02/2012 FINAL   Final   MRSA PCR SCREENING     Status: Normal   Collection Time   04/01/12  2:44 AM      Component Value Range Status Comment   MRSA by PCR NEGATIVE  NEGATIVE Final   CLOSTRIDIUM DIFFICILE BY PCR     Status: Normal   Collection Time   04/03/12  2:54 PM      Component Value Range Status Comment   C difficile by pcr NEGATIVE  NEGATIVE Final      Studies:              All Imaging reviewed  and is as per above notation   Scheduled Meds:    . diazepam  5 mg Oral QHS  . levothyroxine  50 mcg Oral QAC breakfast  . lisinopril  10 mg Oral Daily  . metoprolol  50 mg Oral BID  . pantoprazole  40 mg Oral BID AC  . sertraline  50 mg Oral Daily  . sodium chloride  3 mL Intravenous Q12H  . warfarin  1 mg Oral ONCE-1800  . warfarin  2 mg Oral ONCE-1800  . Warfarin - Pharmacist Dosing Inpatient   Does not apply q1800  . DISCONTD: vancomycin  1,000 mg Intravenous Q24H   Continuous Infusions:     Assessment/Plan: Principal Problem:  *Shock  Active Problems:  Renal insufficiency  RA (rheumatoid arthritis)  GERD (gastroesophageal reflux disease)  Hiatal hernia  Depression  Anxiety  Protein-calorie malnutrition, moderate  Gait instability  Adrenal insufficiency   1. Probable septic shock-resolved-initally broadly with Vancomycin-Zosyn d/c 04/02/12. CCM signed off-Blood cult growing 1/2 bottles Gram + Cocci-coag neg, would disregard.  Daughter reports had a bad bed sore  in R ankle.  I spoke with Orthopedics Dr. Renato Gails who will graciously review the patient 10/5 after Knee and ankle xrays are done-TTE showed no vegetation-x-rays done of the lower extremities on 04/03/2029 +04/04/2012 showed no overt acute findings and I discussed the patient's care with infectious disease specialist on the telephone 04/05/1999 team who recommended discontinuing all antibiotics and reassess 2. Possible adrenal insufficiency contributing to #1-resolved.  Am cortisol was 25.0.  D/c IV Steroids and monitor-keep on Prednisone 20 mg and slow taper.  Will need f/u C Dr. Dierdre Forth as outpatient 3. ?Diarrhoea-get fecal lactoferrin and CDIFF PCR ONLY IF SHE HAS diarrhea documented 4. Hiatal hernia and reflux disease-possible precipitant. She is presented with a similar fashion in the past-as she is not complaining of emesis  or dark stool at present we will hold on further workup at this time-[facility]ology group  [facility]. 5. Elevated troponin-? NSTEMI-? Type II myocardial infarction-get stat EKG, cycle cardiac enzymes, troponin seems to be trending Appreicate Dr. Anne Fu input 04/01/12.  Echocardiogram showed no vegetations 6. Mild Mitral regurgitation-stable 7. DVT-continue coumadin per pharmacy 8. Elevated INR-down to 2.28 today 9. Htn-blood pressure poorly controlled.  Re-implemented Lisinopril @ 10 mg 10/413.  Added prn hydralazine 10 mg for bp above 180/100 10. Sinus tachycardia-likely rebound effect of withdrawal of beta blocker as she has not received any medications today per nursing home documentation-we will cautiously reimplement beta blocker with hold parameters. 11. Relative hypoxia resolved was hypoxic on admission.  She does not use oxygen at home. I have a relatively high threshold to get a CT of the chest-INr supratherapeutic 12. Chronic kidney disease stage 2-3-her BUN/creatinine are baseline at present time 13. Leukocytosis-likely secondary to infectious process versus chronic steroid therapy.  14. HLD-hold Crestor at this time until d/c home 15. Rheumatoid arthritis-sees Dr. Dierdre Forth for the same. We will monitor currently at this time. contineu prednisone taper   Code Status: full Family Communication: Updated Alicea on cell phone. Disposition Plan: Tele   Pleas Koch, MD  [facility] Pager [phone] 04/04/2012, 4:08 PM    LOS: 4 days

## 2012-04-04 NOTE — Progress Notes (Signed)
ANTICOAGULATION CONSULT NOTE - Follow up  Pharmacy Consult for Warfarin Indication: VTE  Patient Measurements: Height: 5\' 5"  (165.1 cm) Weight: 152 lb 5.4 oz (69.1 kg) IBW/kg (Calculated) : 57   Vital Signs: Temp: 98.3 F (36.8 C) (10/06 0627) Temp src: Oral (10/06 0627) BP: 172/85 mmHg (10/06 0627) Pulse Rate: 78  (10/06 0627)  Labs:  Alvira Philips 04/04/12 0520 04/03/12 0941 04/03/12 0502 04/02/12 0905 04/02/12 0310 04/01/12 1414  HGB 9.6* 9.1* -- -- -- --  HCT 30.4* 28.1* -- 25.1* -- --  PLT 228 233 -- 210 -- --  APTT -- -- -- -- -- --  LABPROT 20.1* -- 29.4* -- 45.2* --  INR 1.78* -- 2.98* -- 5.32* --  HEPARINUNFRC -- -- -- -- -- --  CREATININE 1.00 1.07 -- -- -- --  CKTOTAL -- -- -- -- -- 209*  CKMB -- -- -- -- -- 8.9*  TROPONINI -- -- -- -- -- --   Estimated Creatinine Clearance: 43.8 ml/min (by C-G formula based on Cr of 1).  Medications:  Scheduled:     . diazepam  5 mg Oral QHS  . levothyroxine  50 mcg Oral QAC breakfast  . lisinopril  10 mg Oral Daily  . metoprolol  50 mg Oral BID  . pantoprazole  40 mg Oral BID AC  . sertraline  50 mg Oral Daily  . sodium chloride  3 mL Intravenous Q12H  . vancomycin  1,000 mg Intravenous Q24H  . warfarin  1 mg Oral ONCE-1800  . Warfarin - Pharmacist Dosing Inpatient   Does not apply q1800  . DISCONTD: pantoprazole  40 mg Oral Daily   Assessment:  82 YOF admitted with suspected pna, on warfarin for hx of recent DVT; home dose 1.5mg  daily  INR  supratherapeutic 10/3 & 10/4  Warfarin 1mg  10/5, INR now < 2  Broad-spectrum abx can increase response to warfarin.   Hx GIB, GERD noted. On PPI.  Hgb low but stable. No bleeding reported/documented. Per MD, anemia is from chronic disease.    Goal of Therapy:  INR 2-3   Plan:   Warfarin 2mg  today  F/u PT/INR daily.  Otho Bellows  PharmD, Pager [phone] 04/04/2012,10:21 AM.

## 2012-04-05 LAB — COMPREHENSIVE METABOLIC PANEL
BUN: 21 mg/dL (ref 6–23)
CO2: 29 mEq/L (ref 19–32)
Calcium: 8.8 mg/dL (ref 8.4–10.5)
Chloride: 101 mEq/L (ref 96–112)
Creatinine, Ser: 1.26 mg/dL — ABNORMAL HIGH (ref 0.50–1.10)
GFR calc Af Amer: 45 mL/min — ABNORMAL LOW (ref >90)
GFR calc non Af Amer: 39 mL/min — ABNORMAL LOW (ref >90)
Glucose, Bld: 102 mg/dL — ABNORMAL HIGH (ref 70–99)
Total Bilirubin: 0.4 mg/dL (ref 0.3–1.2)

## 2012-04-05 LAB — GLUCOSE, CAPILLARY
Glucose-Capillary: 112 mg/dL — ABNORMAL HIGH (ref 70–99)
Glucose-Capillary: 145 mg/dL — ABNORMAL HIGH (ref 70–99)

## 2012-04-05 MED ORDER — HYDROCODONE-ACETAMINOPHEN 5-500 MG PO TABS: 1.0000 | ORAL_TABLET | Freq: Four times a day (QID) | ORAL | Status: DC | PRN

## 2012-04-05 MED ORDER — WARFARIN SODIUM 1 MG PO TABS
1.5000 mg | ORAL_TABLET | Freq: Once | ORAL | Status: DC
  Filled 2012-04-05: qty 1

## 2012-04-05 MED ORDER — PREDNISONE 10 MG PO TABS
10.0000 mg | ORAL_TABLET | Freq: Every day | ORAL | Status: DC
  Administered 2012-04-05: 10 mg via ORAL
  Filled 2012-04-05: qty 1

## 2012-04-05 NOTE — Progress Notes (Signed)
ANTICOAGULATION CONSULT NOTE - Follow up  Pharmacy Consult for Warfarin Indication: VTE  Patient Measurements: Height: 5\' 5"  (165.1 cm) Weight: 152 lb 5.4 oz (69.1 kg) IBW/kg (Calculated) : 57   Vital Signs: Temp: 97.8 F (36.6 C) (10/07 0459) Temp src: Oral (10/07 0459) BP: 143/71 mmHg (10/07 0459) Pulse Rate: 89  (10/07 0459)  Labs:  Basename 04/05/12 0422 04/04/12 1709 04/04/12 0520 04/03/12 0941 04/03/12 0502  HGB -- 9.9* 9.6* -- --  HCT -- 31.4* 30.4* 28.1* --  PLT -- 211 228 233 --  APTT -- -- -- -- --  LABPROT 19.0* -- 20.1* -- 29.4*  INR 1.65* -- 1.78* -- 2.98*  HEPARINUNFRC -- -- -- -- --  CREATININE 1.26* -- 1.00 1.07 --  CKTOTAL -- -- -- -- --  CKMB -- -- -- -- --  TROPONINI -- -- -- -- --   Estimated Creatinine Clearance: 34.7 ml/min (by C-G formula based on Cr of 1.26).  Medications:  Scheduled:     . diazepam  5 mg Oral QHS  . levothyroxine  50 mcg Oral QAC breakfast  . lisinopril  10 mg Oral Daily  . metoprolol  50 mg Oral BID  . pantoprazole  40 mg Oral BID AC  . sertraline  50 mg Oral Daily  . sodium chloride  3 mL Intravenous Q12H  . warfarin  2 mg Oral ONCE-1800  . Warfarin - Pharmacist Dosing Inpatient   Does not apply q1800  . DISCONTD: vancomycin  1,000 mg Intravenous Q24H   Assessment:  82 YOF admitted with suspected pna, on warfarin for hx of recent DVT; home dose 1.5mg  daily  INR  supratherapeutic 10/3 & 10/4  INR now < 2  Anticipate will take several days to reestablish therapeutic INR due to missing multiple doses of warfarin due to INR >3.  Hx GIB, GERD noted. On PPI.  Hgb low but stable. No bleeding reported/documented. Per MD, anemia is from chronic disease.    Goal of Therapy:  INR 2-3   Plan:   Warfarin 1.5 mg today  F/u PT/INR daily.  Darrol Angel, PharmD Pager: [phone] 04/05/2012,10:38 AM.

## 2012-04-05 NOTE — Evaluation (Addendum)
Occupational Therapy Evaluation Patient Details Name: [name] MRN: [id number] DOB: [birth year] Today's Date: 04/05/2012 Time: 4098-1191 OT Time Calculation (min): 18 min  OT Assessment / Plan / Recommendation Clinical Impression  Pt presents with shock from urinary cause with history of several spine and ortho surgeries and CVA.  Pt presents with overall deconditioning and activity tolerance. Will benefit from continued OT services while on acute and after return to SNF.    OT Assessment  Patient needs continued OT Services    Follow Up Recommendations  Skilled nursing facility    Barriers to Discharge      Equipment Recommendations  Rolling walker with 5" wheels;3 in 1 bedside comode    Recommendations for Other Services    Frequency  Min 2X/week    Precautions / Restrictions Precautions Precautions: Fall (careful with R UE; pt reports fall and fracture R UE at SNF) Restrictions Weight Bearing Restrictions: No        ADL  Eating/Feeding: Simulated;Independent Where Assessed - Eating/Feeding: Chair Grooming: Simulated;Set up;Wash/dry hands Where Assessed - Grooming: Supported sitting Upper Body Bathing: Simulated;Chest;Right arm;Left arm;Abdomen;Minimal assistance Where Assessed - Upper Body Bathing: Unsupported sitting Lower Body Bathing: Simulated;Moderate assistance Where Assessed - Lower Body Bathing: Supported sit to stand Upper Body Dressing: Simulated;Minimal assistance Where Assessed - Upper Body Dressing: Unsupported sitting Lower Body Dressing: Simulated;Moderate assistance Where Assessed - Lower Body Dressing: Supported sit to stand Toilet Transfer: Mining engineer Method: Other (comment) (took steps across room with RW and chair pulled up) Toileting - Clothing Manipulation and Hygiene: Simulated;Moderate assistance Where Assessed - Toileting Clothing Manipulation and Hygiene: Standing Tub/Shower Transfer Method: Not  assessed Equipment Used: Rolling walker    OT Diagnosis: Generalized weakness  OT Problem List: Decreased strength;Decreased activity tolerance;Decreased knowledge of use of DME or AE OT Treatment Interventions: Self-care/ADL training;Therapeutic activities;DME and/or AE instruction;Patient/family education   OT Goals Acute Rehab OT Goals OT Goal Formulation: With patient Time For Goal Achievement: 04/19/12 Potential to Achieve Goals: Good ADL Goals Pt Will Perform Grooming: with supervision;Standing at sink ADL Goal: Grooming - Progress: Goal set today Pt Will Perform Lower Body Bathing: with supervision;Sit to stand from chair;Sit to stand from bed ADL Goal: Lower Body Bathing - Progress: Goal set today Pt Will Perform Lower Body Dressing: with supervision;Sit to stand from chair;Sit to stand from bed ADL Goal: Lower Body Dressing - Progress: Goal set today Pt Will Transfer to Toilet: with supervision;Ambulation;with DME;3-in-1 ADL Goal: Toilet Transfer - Progress: Goal set today Pt Will Perform Toileting - Clothing Manipulation: with supervision;Standing ADL Goal: Toileting - Clothing Manipulation - Progress: Goal set today  Visit Information  Last OT Received On: 04/05/12 Assistance Needed: +1 PT/OT Co-Evaluation/Treatment: Yes    Subjective Data  Subjective: I can try Patient Stated Goal: agreeable to work with PT/OT    Prior Functioning     Home Living Available Help at Discharge: Skilled Nursing Facility Type of Home: Skilled Nursing Facility Home Adaptive Equipment: Wheelchair - manual;Walker - rolling Additional Comments: Used w/c a lot of the time.  she was able to propel herself around, but was receiving meals in her room.  Was receiving PT/OT and was ambulating with RW with them.  Prior Function Level of Independence: Needs assistance Needs Assistance: Gait;Bathing;Dressing;Toileting;Meal Prep;Light Housekeeping Bath: Minimal Dressing: Minimal Toileting:  Minimal Meal Prep: Total Light Housekeeping: Total Able to Take Stairs?: No Vocation: Retired Comments: Pt was pivoting with assist from w/c to restroom.  Communication Communication: No difficulties Dominant  Hand: Right         Vision/Perception     Cognition  Overall Cognitive Status: Appears within functional limits for tasks assessed/performed Arousal/Alertness: Awake/alert Orientation Level: Appears intact for tasks assessed Behavior During Session: [facility] for tasks performed    Extremity/Trunk Assessment Right Upper Extremity Assessment RUE ROM/Strength/Tone: Deficits RUE ROM/Strength/Tone Deficits: pt reports a fall at SNF this year and a fracture at that time in R UE. Therefore didnt MMT. AROM to about 120 then some discomfort. Elbow to distal grossy 3+/5 and fair grip. pt states still hard to write with R hand. Left Upper Extremity Assessment LUE ROM/Strength/Tone: Deficits LUE ROM/Strength/Tone Deficits: overall 3+/5 strength and fair grip Right Lower Extremity Assessment RLE ROM/Strength/Tone: Deficits RLE ROM/Strength/Tone Deficits: Generalized muscle weakness, grossly 3+/5 per functional assessment RLE Sensation: WFL - Light Touch Left Lower Extremity Assessment LLE ROM/Strength/Tone: Deficits LLE ROM/Strength/Tone Deficits: Generalized weakness, grossly 3+/5 per functional assessment.  LLE Sensation: WFL - Light Touch Trunk Assessment Trunk Assessment: Kyphotic     Mobility Bed Mobility Bed Mobility: Not assessed Details for Bed Mobility Assistance: Pt in recliner when PT arrived.  Transfers Transfers: Sit to Stand;Stand to Sit Sit to Stand: 4: Min assist;With upper extremity assist;From chair/3-in-1 Stand to Sit: 4: Min assist;With upper extremity assist;To chair/3-in-1 Details for Transfer Assistance: assist to rise and steady with cues for upright posture once standing and hand placement/safety     Shoulder Instructions     Exercise     Balance      End of Session OT - End of Session Activity Tolerance: Patient tolerated treatment well Patient left: in chair;with call bell/phone within reach  GO     Lennox Laity [phone] 04/05/2012, 12:57 PM

## 2012-04-05 NOTE — Evaluation (Signed)
Physical Therapy Evaluation Patient Details Name: [name] MRN: [id number] DOB: 1930/06/01 Today's Date: 04/05/2012 Time: 4098-1191 PT Time Calculation (min): 18 min  PT Assessment / Plan / Recommendation Clinical Impression  Pt presents with shock from urinary cause with history of several spine and ortho surgeries and CVA.  Tolerated very limited ambulation in room only due to weakness/fatigue.  Pt will benefit from skilled PT in acute venue to address deficits.  PT recommends returning to SNF for continued therapy to increase pt independence.      PT Assessment  Patient needs continued PT services    Follow Up Recommendations  Supervision/Assistance - 24 hour;Post acute inpatient    Does the patient have the potential to tolerate intense rehabilitation   No, Recommend SNF  Barriers to Discharge None      Equipment Recommendations  Rolling walker with 5" wheels    Recommendations for Other Services     Frequency Min 3X/week    Precautions / Restrictions Precautions Precautions: Fall Restrictions Weight Bearing Restrictions: No   Pertinent Vitals/Pain       Mobility  Bed Mobility Bed Mobility: Not assessed Details for Bed Mobility Assistance: Pt in recliner when PT arrived.  Transfers Transfers: Sit to Stand;Stand to Sit Sit to Stand: 4: Min assist;With upper extremity assist;With armrests;From chair/3-in-1 Stand to Sit: 4: Min assist;With upper extremity assist;With armrests;To chair/3-in-1 Details for Transfer Assistance: Assist to rise and steady with cues for upright posture once standing and hand placement/safety.  Ambulation/Gait Ambulation/Gait Assistance: 4: Min assist Ambulation Distance (Feet): 15 Feet Assistive device: Rolling walker Ambulation/Gait Assistance Details: Mod cues for sequencing/technique with RW, maintaining position inside of RW, increasing stride length and to maintain upright posture as much as possible.  Gait Pattern: Step-to  pattern;Decreased stride length;Trunk flexed;Narrow base of support;Shuffle Gait velocity: decreased General Gait Details: Limited gait due to weakness/fatigue.  Stairs: No Wheelchair Mobility Wheelchair Mobility: No    Shoulder Instructions     Exercises     PT Diagnosis: Difficulty walking;Generalized weakness;Acute pain  PT Problem List: Decreased strength;Decreased range of motion;Decreased activity tolerance;Decreased balance;Decreased mobility;Decreased knowledge of use of DME;Decreased safety awareness;Pain PT Treatment Interventions: DME instruction;Gait training;Functional mobility training;Therapeutic activities;Therapeutic exercise;Balance training;Patient/family education   PT Goals Acute Rehab PT Goals PT Goal Formulation: With patient Time For Goal Achievement: 04/12/12 Potential to Achieve Goals: Fair Pt will go Supine/Side to Sit: with min assist PT Goal: Supine/Side to Sit - Progress: Goal set today Pt will go Sit to Supine/Side: with min assist PT Goal: Sit to Supine/Side - Progress: Goal set today Pt will go Sit to Stand: with supervision PT Goal: Sit to Stand - Progress: Goal set today Pt will go Stand to Sit: with supervision PT Goal: Stand to Sit - Progress: Goal set today Pt will Transfer Bed to Chair/Chair to Bed: with supervision PT Transfer Goal: Bed to Chair/Chair to Bed - Progress: Goal set today Pt will Ambulate: 16 - 50 feet;with supervision;with least restrictive assistive device PT Goal: Ambulate - Progress: Goal set today  Visit Information  Last PT Received On: 04/05/12 Assistance Needed: +1    Subjective Data  Subjective: I'm feeling better than earlier Patient Stated Goal: return to [facility]   Prior Functioning  Home Living Available Help at Discharge: Skilled Nursing Facility Type of Home: Skilled Nursing Facility Home Adaptive Equipment: Wheelchair - manual;Walker - rolling Additional Comments: Used w/c a lot of the time.  she  was able to propel herself around, but was  receiving meals in her room.  Was receiving PT/OT and was ambulating with RW with them.  Prior Function Level of Independence: Needs assistance Needs Assistance: Gait Able to Take Stairs?: No Vocation: Retired Comments: Pt was pivoting with assist from w/c to restroom.  Communication Communication: No difficulties Dominant Hand: Right    Cognition  Overall Cognitive Status: Appears within functional limits for tasks assessed/performed Arousal/Alertness: Awake/alert Orientation Level: Appears intact for tasks assessed Behavior During Session: [facility] for tasks performed    Extremity/Trunk Assessment Right Lower Extremity Assessment RLE ROM/Strength/Tone: Deficits RLE ROM/Strength/Tone Deficits: Generalized muscle weakness, grossly 3+/5 per functional assessment RLE Sensation: WFL - Light Touch Left Lower Extremity Assessment LLE ROM/Strength/Tone: Deficits LLE ROM/Strength/Tone Deficits: Generalized weakness, grossly 3+/5 per functional assessment.  LLE Sensation: WFL - Light Touch Trunk Assessment Trunk Assessment: Kyphotic   Balance    End of Session PT - End of Session Activity Tolerance: Patient limited by fatigue Patient left: in chair;with call bell/phone within reach Nurse Communication: Mobility status  GP     Page, Meribeth Mattes 04/05/2012, 12:02 PM

## 2012-04-05 NOTE — Progress Notes (Signed)
Patient is set to discharge back to [facility] today. Patient & family aware. [facility] called for transport.   Unice Bailey, LCSW [facility] Clinical Social Worker cell #: [phone]

## 2012-04-05 NOTE — Discharge Summary (Signed)
Physician Discharge Summary  [name] WUJ:[id number] DOB: Apr 07, 1930 DOA: 03/31/2012  PCP: Oneal Grout, MD  Admit date: 03/31/2012 Discharge date: 04/05/2012  Recommendations for Outpatient Follow-up:  1. Patient will need close followup with her regular physician and endocrinologist as an outpatient 2. She should have anxiety addressed-please consider discontinuation of her benzodiazepine 3. Would recommend INR/CBC/complex metabolic panel in 2-3 days-we'll need to have her Coumadin dosage adjusted and potentially as a candidate for xarelto given wide swings of INr in hospital  Discharge Diagnoses:  Principal Problem:  *Shock Active Problems:  Renal insufficiency  RA (rheumatoid arthritis)  GERD (gastroesophageal reflux disease)  Hiatal hernia  Depression  Anxiety  Protein-calorie malnutrition, moderate  Gait instability  Adrenal insufficiency  Sepsis associated hypotension  Elevated troponin  Coronary atherosclerosis of native coronary artery  Long term (current) use of anticoagulants   Discharge Condition: Good  Diet recommendation: htn  Filed Weights   04/02/12 1500 04/03/12 0638 04/04/12 0627  Weight: 74 kg (163 lb 2.3 oz) 74.3 kg (163 lb 12.8 oz) 69.1 kg (152 lb 5.4 oz)    Brief narrative:  78 yr old CF admitted with potentiatial sepsis, likely of urinary cause with hypotension and tachycardia in a setting of chronic immunosuppression with steroids for RA-patient was pancultured and kept on broad-spectrum antibiotics which were narrowed on 04/02/2012 given she grew one of 2 bottles in her blood culture of Coagulase negative Staphylococcus. She continued to improve but experienced bouts of nausea no overt diarrhea but rather loose stool. She had a full workup for infectious disease including an orthopedic consultation because she has retained hardware in her ankle/knee on the right side as well as prior history of complicated back surgery at [facility] in the 90s. She was  found to have no source of infection and infectious disease was telephone consulted who recommended discontinuing all antibiotics and she was afebrile and had a normal white count on discharge.  1. Probable septic shock-resolved, potentially a viral gastroenteritis was the culprit-initally broadly with Vancomycin-Zosyn d/c 04/02/12. CCM signed off-Blood cult growing 1/2 bottles Gram + Cocci-coag neg, would disregard. Daughter reports had a bad bed sore in R ankle. I spoke with Orthopedics Dr. Renato Gails who will graciously reviewed the patient 10/5 after Knee and ankle xrays are done-TTE showed no vegetation-x-rays done of the lower extremity joints on 04/03/2029 +04/04/2012 showed no overt acute findings and I discussed the patient's care with infectious disease specialist on the telephone 04/05/1999 team who recommended discontinuing all antibiotics and reassess-patient had no white count no fever no chills on day of discharge 04/05/2012-please see #4 2. Possible adrenal insufficiency contributing to #1-resolved. Am cortisol was 25.0. Transiently was on IV D/c IV Steroids and monitor-keep on Prednisone 10 mg which is her home dose. Will need f/u C Dr. Dierdre Forth as outpatient 3. ?Diarrhoea- fecal lactoferrin still pending on day of discharge and CDIFF PCr was negative 4. Hiatal hernia and reflux disease-possible precipitant. She is presented with a similar fashion in the past wherein she was admitted and was found to have a potential gastroenteritis and no source of fever was found-as she is not complaining of emesis or dark stool at present we will hold on further workup at this time-[facility]. 5. Elevated troponin-? NSTEMI-? Type II myocardial infarction- EKG, cycle cardiac enzymes-these were felt to be acute phase reactants versus secondary to renal insufficiency and cardiologist  Dr. Anne Fu gave input 04/01/12. Echocardiogram showed no vegetations and no obvious cardiac source of infectious  process. 6. Mild Mitral regurgitation-stable 7. Recent diagnosis of DVT at the nursing home-continue coumadin per pharmacy-INR on discharge was 1.65-this will need to be monitored and uptitrated as an outpatient 8. Htn-blood pressure poorly controlled. Re-implemented Lisinopril @ 10 mg 10/413. Added prn hydralazine 10 mg during hospital stay and on discharge she was reimplemented on her thiazide diuretic combination with ACE inhibitor 9. Sinus tachycardia-likely rebound effect of withdrawal of beta blocker as she has not received any medications today per nursing home documentation. This resolved 10. Relative hypoxia resolved was hypoxic on admission. She does not use oxygen at home. I have a relatively high threshold to get a CT of the chest-INr supratherapeutic on admission 11. Chronic kidney disease stage 2-3-her BUN/creatinine are baseline at present time 12. Leukocytosis-likely secondary to infectious process versus chronic steroid therapy.  13. HLD-hold Crestor at this time until d/c home 14. Rheumatoid arthritis-sees Dr. Dierdre Forth for the same. We will monitor currently at this time. contineu prednisone taper  Past medical history-As per Problem list  Chart review  Rheumatoid arthritis diagnosed at age 78  History of right total knee arthroplasty 10/08/2000  Reported history of 3 breast biopsies  Reported history of remote CVA which affected right side of her brain left side of her body and was managed by Dr. Anne Hahn for this unclear as to when this occurred  Hysterectomy 1980, rectocele repair 1980  Complicated back surgery and fusion done in the past at [facility]  Admission 09/09/2003 for symptomatic normocytic anemia-EGD done at that time showed prepyloric antral ulcer in with no visible vessel-she was scoped by Dr. Evette Cristal at that time of [facility]  Admission 01/23/2004 for right subpatellar arthritis-had surgery at that time  Admission 09/17/2004 with vertigo secondary to  possible labyrinthitis with no evidence of CNS event  Admission 01/23/2007 and with nausea and vomiting secondary to adrenal insufficiency and crisis and gastroenteritis precipitating adrenal crisis with steroid-dependent rheumatoid arthritis  admitted 08/19/2007 for a left total knee arthroplasty  Admitted 08/27/2007 Enterobacter UTI and sepsis with abnormal liver function at that time  Admitted 07/06/2010 with non-ST segment elevation MI and nonobstructive ruptured plaque in the LAD-she was not candidate for stenting and has a plaque was nonobstructive given the fact that she had a high risk for recurrent GI bleed in setting of dual antiplatelet therapy it was decided that cardiology at that time to continue medical therapy and she is treated with aspirin and presented initially  Admission 05/10/2011 with infrascapular back pain-she underwent lexis and Cardiolite study showing no evidence of dysfunction  Admission 12/01/2011 with acute left subcortical lacunar infarct corpus callosum with dysarthria-at that time she had carotid Dopplers which were nonrevealing normal echocardiogram MRI showing what was noted earlier her aspirin is advanced to full dose 325 mg given she has poor tolerance to Plavix-she also had another MRI during that admission showing new A. of restricted diffusion in most posterior left medial frontal lobe felt to call for weakness of right extremity and stable infarct corpus callosum left singulate gyrus  Last admission was 03/16/2012 for nausea vomiting and septic shock and she was admitted by critical care medicine at that time   Consultants:  PCCM  Cardiology-[facility]  Orthopedics-Dr Renato Gails  Telephone consulted ID Dr. Luciana Axe 04/04/12   Procedures:  Portable chest x-ray 03/31/12 = low lung volumes without acute findings, hiatal hernia and suspect chronic volume loss and atelectasis at left lung base  Antibiotics:  Cefepime 10/2>>> Zosyn 10/02>>> Vancomycin 10/02>>>  Urine  culture NG  C. difficile is negative  Discharge Exam: Filed Vitals:   04/04/12 1155 04/04/12 1415 04/04/12 2020 04/05/12 0459  BP: 155/73 148/73 152/73 143/71  Pulse: 85 80 84 89  Temp: 98.3 F (36.8 C) 98.6 F (37 C) 98.1 F (36.7 C) 97.8 F (36.6 C)  TempSrc: Oral Oral Oral Oral  Resp:  18 18 18   Height:      Weight:      SpO2:  96% 97% 96%     Discharge Instructions     Medication List     As of 04/05/2012 11:13 AM    TAKE these medications         acetaminophen 500 MG tablet   Commonly known as: TYLENOL   Take 500 mg by mouth every 6 (six) hours as needed. For pain      aspirin 81 MG chewable tablet   Chew 81 mg by mouth daily.      diazepam 5 MG tablet   Commonly known as: VALIUM   Take 5 mg by mouth at bedtime.      esomeprazole 40 MG capsule   Commonly known as: NEXIUM   Take 40 mg by mouth daily before breakfast.      Fish Oil 1200 MG Caps   Take 1,200 mg by mouth daily.      folic acid 1 MG tablet   Commonly known as: FOLVITE   Take 1 mg by mouth daily.      HYDROcodone-acetaminophen 5-500 MG per tablet   Commonly known as: VICODIN   Take 1 tablet by mouth every 6 (six) hours as needed for pain. Pain      levothyroxine 50 MCG tablet   Commonly known as: SYNTHROID, LEVOTHROID   Take 50 mcg by mouth daily.      lisinopril-hydrochlorothiazide 20-12.5 MG per tablet   Commonly known as: PRINZIDE,ZESTORETIC   Take 1 tablet by mouth daily.      metoprolol 50 MG tablet   Commonly known as: LOPRESSOR   Take 50 mg by mouth 2 (two) times daily.      multivitamin tablet   Take 1 tablet by mouth daily.      potassium chloride SA 15 MEQ tablet   Commonly known as: KLOR-CON M15   Take 2 tablets (30 mEq total) by mouth daily.      predniSONE 10 MG tablet   Commonly known as: DELTASONE   Take 10 mg by mouth daily.      promethazine 25 MG tablet   Commonly known as: PHENERGAN   Take 25 mg by mouth every 6 (six) hours as needed. nausea       rosuvastatin 20 MG tablet   Commonly known as: CRESTOR   Take 20 mg by mouth daily.      sertraline 50 MG tablet   Commonly known as: ZOLOFT   Take 50 mg by mouth daily.      VITAMIN C PO   Take 500 mg by mouth daily.      warfarin 1 MG tablet   Commonly known as: COUMADIN   Take 1.5 tablets (1.5 mg total) by mouth daily. Take 1.5 tablet (1.5 mg total) daily            The results of significant diagnostics from this hospitalization (including imaging, microbiology, ancillary and laboratory) are listed below for reference.    Significant Diagnostic Studies: Ct Abdomen Pelvis Wo Contrast  03/16/2012  *RADIOLOGY REPORT*  Clinical Data: Weakness.  Left shoulder and neck pain.  Urinary tract infection.  Fever.  Vomiting.  Abdominal pain.  Renal insufficiency.  CT ABDOMEN AND PELVIS WITHOUT CONTRAST  Technique:  Multidetector CT imaging of the abdomen and pelvis was performed following the standard protocol without intravenous contrast.  Comparison: Multiple exams, including 12/06/2011 and 06/09/2004  Findings: Posterolateral thoracolumbar spinal rod fixation with thoracic laminar hooks and lumbar pedicle screws noted.  There is chronic lucency around the L5 pedicle screws suggesting loosening or infection.  T9, T10, and T12 compression fractures appear old and similar to lumbar spine radiographs from 12/11/2009.  A large hiatal hernia is present.  Mild to moderate atelectasis noted medially in the lower lobes.  Coronary artery atherosclerotic calcification noted along with aortic valve calcification.  The visualized portion of the liver, spleen, pancreas, and adrenal glands appear unremarkable in noncontrast CT appearance.  The gallbladder and biliary system appear unremarkable.  The kidneys appear unremarkable, as do the proximal ureters.  Aortoiliac atherosclerotic calcification noted.  Levoconvex lumbar scoliosis noted with rotary component.  Appendix poorly seen.  Scattered colonic  diverticula are observed.  Bone graft harvesting site from the right iliac bone noted.  A Foley catheter is coiled in the urinary bladder with the balloon inflated in the urinary bladder.  Urinary bladder otherwise in the. Nitrogen gas phenomenon noted in the sacroiliac joints.  There is been some bony remodeling along the right pubic ramus fractures and pubic body fracture indicating interval healing response compared to 2005.  No abnormal fluid collection in the vicinity of the urinary bladder observed.  No free pelvic fluid identified  Degenerative arthropathy of the hips noted.  Uterus absent.  No adnexal lesion noted.  IMPRESSION:  1.  Large hiatal hernia. 2.  Mild atelectasis in both lung bases. 3.  Atherosclerosis.  4.  Interval healing response along the remote pelvic fractures compared to the 2005 examination. 5.  Chronic lucency around the L5 pedicle screws suggesting loosening or infection. 6.  Old compression fractures at T9, T10, and T12. 7.  Lumbar scoliosis with rotary component. 8.  Degenerative arthropathy of the hips.   Original Report Authenticated By: Dellia Cloud, M.D.    X-ray Chest Pa And Lateral   03/31/2012  *RADIOLOGY REPORT*  Clinical Data: Pneumonia.  Shortness of breath.  CHEST - 2 VIEW  Comparison: 10/02/2013earlier the same day no  Findings: No focal consolidation. Streaky opacity the medial retrocardiac region is probably atelectatic, adjacent to the hiatal hernia.  No pulmonary edema.  No pleural effusion. Cardiopericardial silhouette is at upper limits of normal for size. Hiatal hernia again noted.  Bilateral thoracolumbar fusion rods evident. Telemetry leads overlie the chest.  IMPRESSION: Stable exam. Retrocardiac atelectasis.  Hiatal hernia.   Original Report Authenticated By: ERIC A. MANSELL, M.D.    Dg Chest 2 View  03/19/2012  *RADIOLOGY REPORT*  Clinical Data: Sepsis, fever.  History of stroke and weakness on the left side.  CHEST - 2 VIEW  Comparison:    03/18/2012  Findings: The patient has had prior vertebral fusion.  Heart is enlarged.  There is dense opacity at the left lung base consistent with infiltrate.  Bilateral small effusions are suspected.  There has been some improvement in overall aeration since previous exam.  IMPRESSION: Persistent left lower lobe infiltrate and probable bilateral pleural effusions.  Question of aspiration.   Original Report Authenticated By: Patterson Hammersmith, M.D.    Dg Thoracic Spine 2 View  04/04/2012  *RADIOLOGY REPORT*  Clinical Data: Back pain.  Fever.  THORACIC SPINE - 2 VIEW  Comparison: 03/31/2012 and multiple previous  Findings: There has been spinal fusion rods.  The configuration is the same as was seen previously.  No evidence of rod fracture or dislodged hooks.  There are multiple compression deformities throughout the mid and lower thoracic region, not visibly changed since previous exams.  No plain radiographic finding to suggest diagnosable infection.  IMPRESSION: Extensive thoracic fusion with posterior rods, hooks and screws. Multiple thoracic compression deformities.  No identifiably acute finding.   Original Report Authenticated By: Thomasenia Sales, M.D.    Dg Lumbar Spine 2-3 Views  04/04/2012  *RADIOLOGY REPORT*  Clinical Data: Spinal fusion.  Fever.  LUMBAR SPINE - 2-3 VIEW  Comparison: CT 03/16/2012  Findings: Fusion procedure extends from the thoracic region all way down to L5.  No fracture in the lumbar region.  There is chronic rod fracture on the right just above the L4 pedicle screw.  There is evidence of screw motion on the right at L5 with peri screw lucency.  Chronic degenerative changes effect the L5-S1 disc level in the sacroiliac joints.  No findings to suggest regional infection.  IMPRESSION: No acute finding.  Previous thoracolumbar fusion.  Rod fracture on the right above L4.  Screw loosening on the right at L5.   Original Report Authenticated By: Thomasenia Sales, M.D.    Dg Knee 1-2  Views Left  04/04/2012  *RADIOLOGY REPORT*  Clinical Data: Pain.  Fever.  LEFT KNEE - 1-2 VIEW  Comparison: A  Findings: Previous total knee replacement.  No apparent joint effusion.  Components appear well positioned.  No significant plain radiographic finding.  Arterial calcification.  IMPRESSION: Unremarkable appearance following total knee replacement.   Original Report Authenticated By: Thomasenia Sales, M.D.    Dg Knee 1-2 Views Right  04/03/2012  *RADIOLOGY REPORT*  Clinical Data: Pain.  RIGHT KNEE - 1-2 VIEW  Comparison: None.  Findings: There has been previous total knee arthroplasty.  No joint effusion visible.  No evidence of loosening or other complication.  Arterial calcification incidentally noted.  IMPRESSION: Good appearance following total knee arthroplasty.   Original Report Authenticated By: Thomasenia Sales, M.D.    Dg Ankle 2 Views Right  04/03/2012  *RADIOLOGY REPORT*  Clinical Data: Pain  RIGHT ANKLE - 2 VIEW  Comparison: None.  Findings: The patient has had previous subtalar fusion with placement of two screws across the subtalar joint.  Fusion appears solid.  Tibiotalar articulation remains fairly normal.  There are osteoarthritic changes in the midfoot.  No evidence of fracture. No plain radiographic evidence by osteomyelitis.  IMPRESSION: Previous hindfoot fusion.  No acute finding.  See above.   Original Report Authenticated By: Thomasenia Sales, M.D.    Dg Chest Port 1 View  03/31/2012  *RADIOLOGY REPORT*  Clinical Data: Shortness of breath.  PORTABLE CHEST - 1 VIEW  Comparison: 03/19/2012 and CT 03/16/2012  Findings: There is a stable retrocardiac density consistent with a hiatal hernia.  The patient has fusion rods within the thoracic spine.  There are persistent low lung volumes without focal disease.  Atelectasis or scarring at the left lung base.  IMPRESSION: Low lung volumes without acute findings.  Hiatal hernia and suspect chronic volume loss and atelectasis at the left lung  base.   Original Report Authenticated By: Richarda Overlie, M.D.    Dg Chest Port 1 View  03/18/2012  *RADIOLOGY REPORT*  Clinical Data:  Atelectasis.  PORTABLE CHEST - 1 VIEW  Comparison: Chest 03/16/2012 and 03/17/2012.  Findings: Left basilar airspace disease has worsened.  Right lung remains clear.  Lung volumes are low.  No pneumothorax.  Heart size normal.  IMPRESSION: Worsened left basilar airspace disease could be due to atelectasis or pneumonia.   Original Report Authenticated By: Bernadene Bell. Maricela Curet, M.D.    Dg Chest Port 1 View  03/17/2012  *RADIOLOGY REPORT*  Clinical Data: Intubation, atelectasis  PORTABLE CHEST - 1 VIEW  Comparison: Portable chest x-ray of 03/16/2012  Findings: No endotracheal tube is visible.  The lungs remain poorly aerated with probable atelectasis and pulmonary vascular congestion.  The heart is mildly enlarged.  Harrington rods are noted throughout the thoracic spine.  IMPRESSION: Poor aeration.  Probable atelectasis and mild pulmonary vascular congestion.   Original Report Authenticated By: Juline Patch, M.D.    Dg Chest Port 1 View  03/16/2012  *RADIOLOGY REPORT*  Clinical Data: Chest pain, fever.  PORTABLE CHEST - 1 VIEW  Comparison: 02/27/2012  Findings: Hypoaeration.  Hiatal hernia.  Heart size upper normal. Mild left lung base opacity is likely atelectasis.  Interstitial and vascular crowding. Mild right paratracheal opacity is likely vasculature.  No pneumothorax.  No definite pleural effusion. Multilevel degenerative change.  Fusion hardware throughout the thoracic vertebrae.  IMPRESSION: Mild left lung base opacity; atelectasis (favored) versus infiltrate.  Hiatal hernia.   Original Report Authenticated By: Waneta Martins, M.D.     Microbiology: Recent Results (from the past 240 hour(s))  CULTURE, BLOOD (ROUTINE X 2)     Status: Normal   Collection Time   03/31/12  2:00 PM      Component Value Range Status Comment   Specimen Description BLOOD LEFT ARM    Final    Special Requests BOTTLES DRAWN AEROBIC AND ANAEROBIC 5CC   Final    Culture  Setup Time 04/01/2012 01:22   Final    Culture     Final    Value: STAPHYLOCOCCUS SPECIES (COAGULASE NEGATIVE)     Note: THE SIGNIFICANCE OF ISOLATING THIS ORGANISM FROM A SINGLE SET OF BLOOD CULTURES WHEN MULTIPLE SETS ARE DRAWN IS UNCERTAIN. PLEASE NOTIFY THE MICROBIOLOGY DEPARTMENT WITHIN ONE WEEK IF SPECIATION AND SENSITIVITIES ARE REQUIRED.     Note: Gram Stain Report Called to,Read Back By and Verified With: CRYSTAL MITCHELL 04/02/2012 2:07AM YIMSU   Report Status 04/03/2012 FINAL   Final   CULTURE, BLOOD (ROUTINE X 2)     Status: Normal (Preliminary result)   Collection Time   03/31/12  2:10 PM      Component Value Range Status Comment   Specimen Description BLOOD RIGHT ARM   Final    Special Requests BOTTLES DRAWN AEROBIC AND ANAEROBIC 5CC   Final    Culture  Setup Time 04/01/2012 01:23   Final    Culture     Final    Value:        BLOOD CULTURE RECEIVED NO GROWTH TO DATE CULTURE WILL BE HELD FOR 5 DAYS BEFORE ISSUING A FINAL NEGATIVE REPORT   Report Status PENDING   Incomplete   URINE CULTURE     Status: Normal   Collection Time   03/31/12  2:37 PM      Component Value Range Status Comment   Specimen Description URINE, CLEAN CATCH   Final    Special Requests NONE   Final    Culture  Setup Time 04/01/2012 07:27   Final  Colony Count NO GROWTH   Final    Culture NO GROWTH   Final    Report Status 04/02/2012 FINAL   Final   MRSA PCR SCREENING     Status: Normal   Collection Time   04/01/12  2:44 AM      Component Value Range Status Comment   MRSA by PCR NEGATIVE  NEGATIVE Final   CLOSTRIDIUM DIFFICILE BY PCR     Status: Normal   Collection Time   04/03/12  2:54 PM      Component Value Range Status Comment   C difficile by pcr NEGATIVE  NEGATIVE Final      Labs: Basic Metabolic Panel:  Lab 04/05/12 1610 04/04/12 0520 04/03/12 0941 04/01/12 0324 03/31/12 1400  NA 140 141 140 138 135  K  3.6 3.6 3.6 4.6 4.7  CL 101 103 104 103 100  CO2 29 27 27 23 23   GLUCOSE 102* 77 76 178* 103*  BUN 21 19 23  31* 31*  CREATININE 1.26* 1.00 1.07 1.25* 1.30*  CALCIUM 8.8 9.0 8.8 8.6 9.5  MG -- -- -- -- --  PHOS -- -- -- -- --   Liver Function Tests:  Lab 04/05/12 0422 04/01/12 0324 03/31/12 1400  AST 16 16 16   ALT 17 15 15   ALKPHOS 55 51 59  BILITOT 0.4 0.3 0.3  PROT 5.8* 5.6* 6.0  ALBUMIN 2.7* 2.4* 2.8*   No results found for this basename: LIPASE:5,AMYLASE:5 in the last 168 hours No results found for this basename: AMMONIA:5 in the last 168 hours CBC:  Lab 04/04/12 1709 04/04/12 0520 04/03/12 0941 04/02/12 0905 04/01/12 0324 03/31/12 1400  WBC 10.6* 10.9* 14.4* 20.8* 17.9* --  NEUTROABS 7.1 -- -- 19.2* -- 20.3*  HGB 9.9* 9.6* 9.1* 8.3* 9.4* --  HCT 31.4* 30.4* 28.1* 25.1* 29.8* --  MCV 84.4 84.4 85.2 84.8 86.1 --  PLT 211 228 233 210 216 --   Cardiac Enzymes:  Lab 04/01/12 1414 04/01/12 0720 04/01/12 0122 03/31/12 1945  CKTOTAL 209* 239* -- --  CKMB 8.9* 10.5* -- --  CKMBINDEX -- -- -- --  TROPONINI -- 0.39* 0.41* <0.30   BNP: BNP (last 3 results)  Basename 03/18/12 0400 03/17/12 0530  PROBNP 3700.0* 1429.0*   CBG:  Lab 04/05/12 0748 04/04/12 0754 04/03/12 0737 04/02/12 0825 04/01/12 0756  GLUCAP 112* 80 75 130* 137*    Time coordinating discharge: 50 minutes  Signed:  Rhetta Mura  [facility] 04/05/2012, 10:57 AM

## 2012-04-06 DIAGNOSIS — E785 Hyperlipidemia, unspecified: Secondary | ICD-10-CM | POA: Diagnosis not present

## 2012-04-06 DIAGNOSIS — N289 Disorder of kidney and ureter, unspecified: Secondary | ICD-10-CM | POA: Diagnosis not present

## 2012-04-06 DIAGNOSIS — R279 Unspecified lack of coordination: Secondary | ICD-10-CM | POA: Diagnosis not present

## 2012-04-06 DIAGNOSIS — J90 Pleural effusion, not elsewhere classified: Secondary | ICD-10-CM | POA: Diagnosis not present

## 2012-04-06 DIAGNOSIS — M6281 Muscle weakness (generalized): Secondary | ICD-10-CM | POA: Diagnosis not present

## 2012-04-06 DIAGNOSIS — E039 Hypothyroidism, unspecified: Secondary | ICD-10-CM | POA: Diagnosis not present

## 2012-04-06 DIAGNOSIS — D649 Anemia, unspecified: Secondary | ICD-10-CM | POA: Diagnosis not present

## 2012-04-06 DIAGNOSIS — Z5189 Encounter for other specified aftercare: Secondary | ICD-10-CM | POA: Diagnosis not present

## 2012-04-06 LAB — FECAL LACTOFERRIN, QUANT: Fecal Lactoferrin: POSITIVE

## 2012-04-07 LAB — CULTURE, BLOOD (ROUTINE X 2)

## 2012-04-22 DIAGNOSIS — I10 Essential (primary) hypertension: Secondary | ICD-10-CM | POA: Diagnosis not present

## 2012-04-22 DIAGNOSIS — M069 Rheumatoid arthritis, unspecified: Secondary | ICD-10-CM | POA: Diagnosis not present

## 2012-04-22 DIAGNOSIS — M6281 Muscle weakness (generalized): Secondary | ICD-10-CM | POA: Diagnosis not present

## 2012-04-22 DIAGNOSIS — E039 Hypothyroidism, unspecified: Secondary | ICD-10-CM | POA: Diagnosis not present

## 2012-04-22 DIAGNOSIS — I80299 Phlebitis and thrombophlebitis of other deep vessels of unspecified lower extremity: Secondary | ICD-10-CM | POA: Diagnosis not present

## 2012-05-05 DIAGNOSIS — F411 Generalized anxiety disorder: Secondary | ICD-10-CM | POA: Diagnosis not present

## 2012-05-05 DIAGNOSIS — F329 Major depressive disorder, single episode, unspecified: Secondary | ICD-10-CM | POA: Diagnosis not present

## 2012-05-13 DIAGNOSIS — D649 Anemia, unspecified: Secondary | ICD-10-CM | POA: Diagnosis not present

## 2012-06-01 ENCOUNTER — Inpatient Hospital Stay (HOSPITAL_COMMUNITY)
Admission: EM | Admit: 2012-06-01 | Discharge: 2012-06-04 | DRG: 372 | Disposition: A | Discharge status: Skilled Nursing Facility | Payer: Medicare Other | Attending: Internal Medicine | Admitting: Internal Medicine

## 2012-06-01 ENCOUNTER — Emergency Department (HOSPITAL_COMMUNITY): Payer: Medicare Other

## 2012-06-01 ENCOUNTER — Encounter (HOSPITAL_COMMUNITY): Payer: Self-pay | Admitting: Emergency Medicine

## 2012-06-01 DIAGNOSIS — K279 Peptic ulcer, site unspecified, unspecified as acute or chronic, without hemorrhage or perforation: Secondary | ICD-10-CM | POA: Diagnosis present

## 2012-06-01 DIAGNOSIS — R112 Nausea with vomiting, unspecified: Secondary | ICD-10-CM | POA: Diagnosis not present

## 2012-06-01 DIAGNOSIS — D72829 Elevated white blood cell count, unspecified: Secondary | ICD-10-CM

## 2012-06-01 DIAGNOSIS — R579 Shock, unspecified: Secondary | ICD-10-CM

## 2012-06-01 DIAGNOSIS — Z7901 Long term (current) use of anticoagulants: Secondary | ICD-10-CM

## 2012-06-01 DIAGNOSIS — K922 Gastrointestinal hemorrhage, unspecified: Secondary | ICD-10-CM

## 2012-06-01 DIAGNOSIS — IMO0002 Reserved for concepts with insufficient information to code with codable children: Secondary | ICD-10-CM | POA: Diagnosis not present

## 2012-06-01 DIAGNOSIS — R630 Anorexia: Secondary | ICD-10-CM

## 2012-06-01 DIAGNOSIS — E039 Hypothyroidism, unspecified: Secondary | ICD-10-CM

## 2012-06-01 DIAGNOSIS — Z7982 Long term (current) use of aspirin: Secondary | ICD-10-CM | POA: Diagnosis not present

## 2012-06-01 DIAGNOSIS — M069 Rheumatoid arthritis, unspecified: Secondary | ICD-10-CM | POA: Diagnosis present

## 2012-06-01 DIAGNOSIS — I10 Essential (primary) hypertension: Secondary | ICD-10-CM | POA: Diagnosis present

## 2012-06-01 DIAGNOSIS — Z8673 Personal history of transient ischemic attack (TIA), and cerebral infarction without residual deficits: Secondary | ICD-10-CM | POA: Diagnosis present

## 2012-06-01 DIAGNOSIS — E860 Dehydration: Secondary | ICD-10-CM

## 2012-06-01 DIAGNOSIS — R6889 Other general symptoms and signs: Secondary | ICD-10-CM | POA: Diagnosis not present

## 2012-06-01 DIAGNOSIS — N289 Disorder of kidney and ureter, unspecified: Secondary | ICD-10-CM | POA: Diagnosis not present

## 2012-06-01 DIAGNOSIS — M1990 Unspecified osteoarthritis, unspecified site: Secondary | ICD-10-CM

## 2012-06-01 DIAGNOSIS — N179 Acute kidney failure, unspecified: Secondary | ICD-10-CM | POA: Diagnosis present

## 2012-06-01 DIAGNOSIS — R1310 Dysphagia, unspecified: Secondary | ICD-10-CM

## 2012-06-01 DIAGNOSIS — R509 Fever, unspecified: Secondary | ICD-10-CM

## 2012-06-01 DIAGNOSIS — K529 Noninfective gastroenteritis and colitis, unspecified: Secondary | ICD-10-CM | POA: Diagnosis present

## 2012-06-01 DIAGNOSIS — R2681 Unsteadiness on feet: Secondary | ICD-10-CM

## 2012-06-01 DIAGNOSIS — B3749 Other urogenital candidiasis: Secondary | ICD-10-CM | POA: Diagnosis present

## 2012-06-01 DIAGNOSIS — H9201 Otalgia, right ear: Secondary | ICD-10-CM

## 2012-06-01 DIAGNOSIS — I69959 Hemiplegia and hemiparesis following unspecified cerebrovascular disease affecting unspecified side: Secondary | ICD-10-CM | POA: Diagnosis not present

## 2012-06-01 DIAGNOSIS — K449 Diaphragmatic hernia without obstruction or gangrene: Secondary | ICD-10-CM

## 2012-06-01 DIAGNOSIS — E440 Moderate protein-calorie malnutrition: Secondary | ICD-10-CM

## 2012-06-01 DIAGNOSIS — M810 Age-related osteoporosis without current pathological fracture: Secondary | ICD-10-CM | POA: Diagnosis present

## 2012-06-01 DIAGNOSIS — N390 Urinary tract infection, site not specified: Secondary | ICD-10-CM | POA: Diagnosis not present

## 2012-06-01 DIAGNOSIS — A0471 Enterocolitis due to Clostridium difficile, recurrent: Secondary | ICD-10-CM | POA: Diagnosis present

## 2012-06-01 DIAGNOSIS — A0472 Enterocolitis due to Clostridium difficile, not specified as recurrent: Secondary | ICD-10-CM | POA: Diagnosis not present

## 2012-06-01 DIAGNOSIS — J069 Acute upper respiratory infection, unspecified: Secondary | ICD-10-CM | POA: Diagnosis present

## 2012-06-01 DIAGNOSIS — I252 Old myocardial infarction: Secondary | ICD-10-CM

## 2012-06-01 DIAGNOSIS — I219 Acute myocardial infarction, unspecified: Secondary | ICD-10-CM

## 2012-06-01 DIAGNOSIS — R7989 Other specified abnormal findings of blood chemistry: Secondary | ICD-10-CM

## 2012-06-01 DIAGNOSIS — R5381 Other malaise: Secondary | ICD-10-CM | POA: Diagnosis not present

## 2012-06-01 DIAGNOSIS — F32A Depression, unspecified: Secondary | ICD-10-CM

## 2012-06-01 DIAGNOSIS — K5289 Other specified noninfective gastroenteritis and colitis: Secondary | ICD-10-CM | POA: Diagnosis not present

## 2012-06-01 DIAGNOSIS — D638 Anemia in other chronic diseases classified elsewhere: Secondary | ICD-10-CM | POA: Diagnosis present

## 2012-06-01 DIAGNOSIS — I2510 Atherosclerotic heart disease of native coronary artery without angina pectoris: Secondary | ICD-10-CM

## 2012-06-01 DIAGNOSIS — D649 Anemia, unspecified: Secondary | ICD-10-CM | POA: Diagnosis not present

## 2012-06-01 DIAGNOSIS — J189 Pneumonia, unspecified organism: Secondary | ICD-10-CM

## 2012-06-01 DIAGNOSIS — A419 Sepsis, unspecified organism: Secondary | ICD-10-CM

## 2012-06-01 DIAGNOSIS — F419 Anxiety disorder, unspecified: Secondary | ICD-10-CM

## 2012-06-01 DIAGNOSIS — R5383 Other fatigue: Secondary | ICD-10-CM

## 2012-06-01 DIAGNOSIS — E2740 Unspecified adrenocortical insufficiency: Secondary | ICD-10-CM

## 2012-06-01 DIAGNOSIS — F329 Major depressive disorder, single episode, unspecified: Secondary | ICD-10-CM

## 2012-06-01 DIAGNOSIS — R0602 Shortness of breath: Secondary | ICD-10-CM

## 2012-06-01 DIAGNOSIS — R197 Diarrhea, unspecified: Secondary | ICD-10-CM | POA: Diagnosis not present

## 2012-06-01 DIAGNOSIS — I639 Cerebral infarction, unspecified: Secondary | ICD-10-CM

## 2012-06-01 DIAGNOSIS — K219 Gastro-esophageal reflux disease without esophagitis: Secondary | ICD-10-CM

## 2012-06-01 LAB — CBC WITH DIFFERENTIAL/PLATELET
HCT: 32.3 % — ABNORMAL LOW (ref 36.0–46.0)
Hemoglobin: 10.2 g/dL — ABNORMAL LOW (ref 12.0–15.0)
Lymphs Abs: 1.3 10*3/uL (ref 0.7–4.0)
Monocytes Absolute: 1.6 10*3/uL — ABNORMAL HIGH (ref 0.1–1.0)
Monocytes Relative: 8 % (ref 3–12)
Neutro Abs: 16.1 10*3/uL — ABNORMAL HIGH (ref 1.7–7.7)
Neutrophils Relative %: 84 % — ABNORMAL HIGH (ref 43–77)
RBC: 3.74 MIL/uL — ABNORMAL LOW (ref 3.87–5.11)

## 2012-06-01 LAB — BASIC METABOLIC PANEL
BUN: 29 mg/dL — ABNORMAL HIGH (ref 6–23)
CO2: 27 mEq/L (ref 19–32)
Chloride: 100 mEq/L (ref 96–112)
Creatinine, Ser: 1.26 mg/dL — ABNORMAL HIGH (ref 0.50–1.10)
Glucose, Bld: 117 mg/dL — ABNORMAL HIGH (ref 70–99)
Potassium: 4.1 mEq/L (ref 3.5–5.1)

## 2012-06-01 LAB — URINALYSIS, ROUTINE W REFLEX MICROSCOPIC
Bilirubin Urine: NEGATIVE
Glucose, UA: NEGATIVE mg/dL
Hgb urine dipstick: NEGATIVE
Ketones, ur: NEGATIVE mg/dL
Nitrite: NEGATIVE
Specific Gravity, Urine: 1.02 (ref 1.005–1.030)
pH: 5.5 (ref 5.0–8.0)

## 2012-06-01 LAB — URINE MICROSCOPIC-ADD ON

## 2012-06-01 MED ORDER — ONDANSETRON HCL 4 MG PO TABS: 4.0000 mg | ORAL_TABLET | Freq: Four times a day (QID) | ORAL | Status: DC | PRN

## 2012-06-01 MED ORDER — POTASSIUM CHLORIDE CRYS ER 20 MEQ PO TBCR
30.0000 meq | EXTENDED_RELEASE_TABLET | Freq: Every day | ORAL | Status: DC
  Administered 2012-06-02 – 2012-06-04 (×3): 30 meq via ORAL
  Filled 2012-06-01 (×3): qty 1

## 2012-06-01 MED ORDER — ACETAMINOPHEN 650 MG RE SUPP
650.0000 mg | RECTAL | Status: DC | PRN
  Administered 2012-06-01: 650 mg via RECTAL
  Filled 2012-06-01: qty 1

## 2012-06-01 MED ORDER — SODIUM CHLORIDE 0.9 % IV BOLUS (SEPSIS)
1000.0000 mL | Freq: Once | INTRAVENOUS | Status: AC
  Administered 2012-06-01: 1000 mL via INTRAVENOUS

## 2012-06-01 MED ORDER — PREDNISONE 10 MG PO TABS
10.0000 mg | ORAL_TABLET | Freq: Every day | ORAL | Status: DC
  Administered 2012-06-02 (×2): 10 mg via ORAL
  Filled 2012-06-01 (×2): qty 1

## 2012-06-01 MED ORDER — HYDROCODONE-ACETAMINOPHEN 5-325 MG PO TABS
1.0000 | ORAL_TABLET | ORAL | Status: DC | PRN
  Administered 2012-06-02 – 2012-06-03 (×2): 1 via ORAL
  Filled 2012-06-01 (×2): qty 1

## 2012-06-01 MED ORDER — METOPROLOL TARTRATE 50 MG PO TABS
50.0000 mg | ORAL_TABLET | Freq: Two times a day (BID) | ORAL | Status: DC
  Administered 2012-06-02 (×2): 50 mg via ORAL
  Filled 2012-06-01 (×3): qty 1

## 2012-06-01 MED ORDER — SERTRALINE HCL 50 MG PO TABS
50.0000 mg | ORAL_TABLET | Freq: Every day | ORAL | Status: DC
  Administered 2012-06-02 – 2012-06-04 (×4): 50 mg via ORAL
  Filled 2012-06-01 (×4): qty 1

## 2012-06-01 MED ORDER — PANTOPRAZOLE SODIUM 40 MG PO TBEC: 40.0000 mg | DELAYED_RELEASE_TABLET | Freq: Every day | ORAL | Status: DC

## 2012-06-01 MED ORDER — RIVAROXABAN 20 MG PO TABS
20.0000 mg | ORAL_TABLET | Freq: Every day | ORAL | Status: DC
  Administered 2012-06-02 – 2012-06-03 (×3): 20 mg via ORAL
  Filled 2012-06-01 (×4): qty 1

## 2012-06-01 MED ORDER — PANTOPRAZOLE SODIUM 40 MG PO TBEC
40.0000 mg | DELAYED_RELEASE_TABLET | Freq: Two times a day (BID) | ORAL | Status: DC
  Administered 2012-06-02: 40 mg via ORAL
  Filled 2012-06-01 (×3): qty 1

## 2012-06-01 MED ORDER — ONDANSETRON HCL 4 MG/2ML IJ SOLN: 4.0000 mg | Freq: Four times a day (QID) | INTRAMUSCULAR | Status: DC | PRN

## 2012-06-01 MED ORDER — ASPIRIN 81 MG PO CHEW
81.0000 mg | CHEWABLE_TABLET | Freq: Every day | ORAL | Status: DC
  Administered 2012-06-02 – 2012-06-04 (×3): 81 mg via ORAL
  Filled 2012-06-01 (×3): qty 1

## 2012-06-01 MED ORDER — FOLIC ACID 1 MG PO TABS
1.0000 mg | ORAL_TABLET | Freq: Every day | ORAL | Status: DC
  Administered 2012-06-02 – 2012-06-04 (×4): 1 mg via ORAL
  Filled 2012-06-01 (×4): qty 1

## 2012-06-01 MED ORDER — SODIUM CHLORIDE 0.9 % IV SOLN: INTRAVENOUS | Status: DC

## 2012-06-01 MED ORDER — DIAZEPAM 5 MG PO TABS
5.0000 mg | ORAL_TABLET | Freq: Every day | ORAL | Status: DC
  Administered 2012-06-02 – 2012-06-03 (×3): 5 mg via ORAL
  Filled 2012-06-01 (×3): qty 1

## 2012-06-01 MED ORDER — ONDANSETRON HCL 4 MG/2ML IJ SOLN
4.0000 mg | Freq: Once | INTRAMUSCULAR | Status: AC
  Administered 2012-06-01: 4 mg via INTRAVENOUS
  Filled 2012-06-01: qty 2

## 2012-06-01 MED ORDER — DEXTROSE 5 % IV SOLN
1.0000 g | INTRAVENOUS | Status: AC
  Administered 2012-06-01 – 2012-06-03 (×3): 1 g via INTRAVENOUS
  Filled 2012-06-01 (×3): qty 10

## 2012-06-01 MED ORDER — PROMETHAZINE HCL 25 MG PO TABS: 25.0000 mg | ORAL_TABLET | Freq: Four times a day (QID) | ORAL | Status: DC | PRN

## 2012-06-01 MED ORDER — LEVOTHYROXINE SODIUM 50 MCG PO TABS
50.0000 ug | ORAL_TABLET | Freq: Every day | ORAL | Status: DC
  Administered 2012-06-02 – 2012-06-04 (×3): 50 ug via ORAL
  Filled 2012-06-01 (×5): qty 1

## 2012-06-01 MED ORDER — ATORVASTATIN CALCIUM 40 MG PO TABS
40.0000 mg | ORAL_TABLET | Freq: Every day | ORAL | Status: DC
  Administered 2012-06-02 – 2012-06-03 (×3): 40 mg via ORAL
  Filled 2012-06-01 (×4): qty 1

## 2012-06-01 NOTE — ED Notes (Signed)
Called to give report, receiving nurse in doing a sterile procedure, will call back.

## 2012-06-01 NOTE — Progress Notes (Signed)
Clinical Social Work Department BRIEF PSYCHOSOCIAL ASSESSMENT 06/01/2012  Patient:  [name], [name]     Account Number:  [id number]     Admit date:  06/01/2012  Clinical Social Worker:  Doree Albee  Date/Time:  06/01/2012 05:06 PM  Referred by:  RN  Date Referred:  06/01/2012 Referred for  SNF Placement   Other Referral:   Interview type:  Patient Other interview type:    PSYCHOSOCIAL DATA Living Status:  FACILITY Admitted from facility:  [facility] Level of care:  Skilled Nursing Facility Primary support name:  Fawn Desrocher Primary support relationship to patient:  CHILD, ADULT Degree of support available:   Shyne Resch [phone] is pt Stephani Police May-Daughter [phone]  Zyriah Mask son- lives in [place] but present at ED bedside [phone]    CURRENT CONCERNS Current Concerns  Post-Acute Placement   Other Concerns:    SOCIAL WORK ASSESSMENT / PLAN CSW met with pt and pt son Greggory Stallion at bedside to complete psychosocial assessment. Pt shared that she is a resident at [facility] and plans to return when medically stable. Pt shared that she has three children that are very supportive and work together regarding pt decisions. Pt son Ramon Dredge is patient HCPOA however per discussion with pt and pt son Greggory Stallion pt children all discuss decisions together. Pt daughter Cathlean Cower is able to visit most of the children on a daily basis.   Assessment/plan status:  Psychosocial Support/Ongoing Assessment of Needs Other assessment/ plan:   and discharge planning   Information/referral to community resources:   none identified at this time    PATIENT'S/FAMILY'S RESPONSE TO PLAN OF CARE: Pt and pt son Greggory Stallion thanked CSW for concern and support. Pt is motivated to return to [facility] when medically stable.   Catha Gosselin, LCSWA  [phone] .06/01/2012 1710pm

## 2012-06-01 NOTE — ED Notes (Signed)
ZOX:[id number]<AV> Expected date:<BR> Expected time:<BR> Means of arrival:Ambulance<BR> Comments:<BR> weakness

## 2012-06-01 NOTE — ED Provider Notes (Signed)
History     CSN: [id number]  Arrival date & time 06/01/12  1134   First MD Initiated Contact with Patient 06/01/12 1225      Chief Complaint  Patient presents with  . Weakness    (Consider location/radiation/quality/duration/timing/severity/associated sxs/prior treatment) Patient is a 78 y.o. female presenting with weakness. The history is provided by the patient and the nursing home.  Weakness  Additional symptoms include weakness.  She started feeling bad about 7 AM. This complaining of weakness, nausea, vomiting, fever, chills. She denies rhinorrhea or sore throat or cough. She vomited several times but no longer is complaining of nausea. She noted within that may have been blood in the stool but Hemoccult was negative. She denies abdominal pain, arthralgias, myalgias. She was given acetaminophen for fever, but she is back to a time that was.  Past Medical History  Diagnosis Date  . SOB (shortness of breath)   . MI (myocardial infarction)   . Poor appetite   . Arthritis   . Fatigue   . Right ear pain   . Renal insufficiency   . RA (rheumatoid arthritis)   . PUD (peptic ulcer disease)   . GI bleed 2005  . GERD (gastroesophageal reflux disease)   . Hiatal hernia   . Hypothyroidism   . Osteoporosis   . Depression   . Anxiety   . Coronary artery disease   . CVA (cerebral vascular accident)   . TIA (transient ischemic attack)   . Fall   . DVT of lower extremity (deep venous thrombosis)     Past Surgical History  Procedure Date  . Cardiac catheterization     SHOWED RUPTURE PLAQUE IN THE LAD. THE LAD IS NONOBSTRUCTIVE WITH ONLY 30-40% STENOSIS  . Nstemi 06/2010  . Spinal fusion surgery   . Knee surgery     Family History  Problem Relation Age of Onset  . Kidney disease Mother   . Kidney disease Brother     History  Substance Use Topics  . Smoking status: Never Smoker   . Smokeless tobacco: Never Used  . Alcohol Use: No    OB History    Grav Para Term  Preterm Abortions TAB SAB Ect Mult Living                  Review of Systems  Neurological: Positive for weakness.  All other systems reviewed and are negative.    Allergies  Codeine; Morphine and related; Percocet; Plavix; and Sulfur  Home Medications   Current Outpatient Rx  Name  Route  Sig  Dispense  Refill  . ACETAMINOPHEN 650 MG RE SUPP   Rectal   Place 650 mg rectally every 4 (four) hours as needed. Pain         . VITAMIN C PO   Oral   Take 500 mg by mouth daily.          . ASPIRIN 81 MG PO CHEW   Oral   Chew 81 mg by mouth daily.         Marland Kitchen DIAZEPAM 5 MG PO TABS   Oral   Take 5 mg by mouth at bedtime.         Marland Kitchen ESOMEPRAZOLE MAGNESIUM 40 MG PO CPDR   Oral   Take 40 mg by mouth daily before breakfast.          . FERROUS SULFATE 325 (65 FE) MG PO TABS   Oral   Take 325 mg by mouth  daily with breakfast.         . FOLIC ACID 1 MG PO TABS   Oral   Take 1 mg by mouth daily.          Marland Kitchen HYDROCODONE-ACETAMINOPHEN 5-500 MG PO TABS   Oral   Take 1 tablet by mouth every 6 (six) hours as needed for pain. Pain   30 tablet   0   . LEVOTHYROXINE SODIUM 50 MCG PO TABS   Oral   Take 50 mcg by mouth daily.          Marland Kitchen LISINOPRIL-HYDROCHLOROTHIAZIDE 20-12.5 MG PO TABS   Oral   Take 1 tablet by mouth daily.          Marland Kitchen METOPROLOL TARTRATE 50 MG PO TABS   Oral   Take 50 mg by mouth 2 (two) times daily.          Marland Kitchen MICONAZOLE NITRATE 2 % EX CREA   Topical   Apply 1 application topically 2 (two) times daily. Buttocks and Groin until healed         . ONE-DAILY MULTI VITAMINS PO TABS   Oral   Take 1 tablet by mouth daily.           Marland Kitchen FISH OIL 1200 MG PO CAPS   Oral   Take 1,200 mg by mouth daily.         Marland Kitchen POTASSIUM CHLORIDE CRYS ER 15 MEQ PO TBCR   Oral   Take 2 tablets (30 mEq total) by mouth daily.         Marland Kitchen PREDNISONE 10 MG PO TABS   Oral   Take 10 mg by mouth daily.         Marland Kitchen PROMETHAZINE HCL 25 MG PO TABS   Oral   Take  25 mg by mouth every 6 (six) hours as needed. nausea         . RIVAROXABAN 10 MG PO TABS   Oral   Take 20 mg by mouth daily.         Marland Kitchen ROSUVASTATIN CALCIUM 20 MG PO TABS   Oral   Take 20 mg by mouth daily.          . SERTRALINE HCL 50 MG PO TABS   Oral   Take 50 mg by mouth daily.            BP 151/60  Pulse 113  Temp 102.9 F (39.4 C) (Rectal)  Resp 20  SpO2 96%  Physical Exam  Nursing note and vitals reviewed. 78 year old female, resting comfortably and in no acute distress. Vital signs are significant for fever with temperature 102.9, tachycardia with heart rate of 113, and hypertension with blood pressure 131/60.Marland Kitchen Oxygen saturation is 96%, which is normal. Head is normocephalic and atraumatic. PERRLA, EOMI. Oropharynx is clear. Neck is nontender and supple without adenopathy or JVD. Back is nontender and there is no CVA tenderness. Lungs are clear without rales, wheezes, or rhonchi. Chest is nontender. Heart has regular rate and rhythm without murmur. Abdomen is soft, flat, nontender without masses or hepatosplenomegaly and peristalsis is hypoactive. Extremities have no cyanosis or edema, full range of motion is present. Skin is warm and dry without rash. Neurologic: Mental status is normal, cranial nerves are intact, there are no motor or sensory deficits.   ED Course  Procedures (including critical care time)  Results for orders placed during the hospital encounter of 06/01/12  CBC WITH DIFFERENTIAL  Component Value Range   WBC 19.1 (*) 4.0 - 10.5 K/uL   RBC 3.74 (*) 3.87 - 5.11 MIL/uL   Hemoglobin 10.2 (*) 12.0 - 15.0 g/dL   HCT 41.3 (*) 24.4 - 01.0 %   MCV 86.4  78.0 - 100.0 fL   MCH 27.3  26.0 - 34.0 pg   MCHC 31.6  30.0 - 36.0 g/dL   RDW 27.2 (*) 53.6 - 64.4 %   Platelets 186  150 - 400 K/uL   Neutrophils Relative 84 (*) 43 - 77 %   Neutro Abs 16.1 (*) 1.7 - 7.7 K/uL   Lymphocytes Relative 7 (*) 12 - 46 %   Lymphs Abs 1.3  0.7 - 4.0 K/uL    Monocytes Relative 8  3 - 12 %   Monocytes Absolute 1.6 (*) 0.1 - 1.0 K/uL   Eosinophils Relative 1  0 - 5 %   Eosinophils Absolute 0.1  0.0 - 0.7 K/uL   Basophils Relative 0  0 - 1 %   Basophils Absolute 0.0  0.0 - 0.1 K/uL  BASIC METABOLIC PANEL      Component Value Range   Sodium 138  135 - 145 mEq/L   Potassium 4.1  3.5 - 5.1 mEq/L   Chloride 100  96 - 112 mEq/L   CO2 27  19 - 32 mEq/L   Glucose, Bld 117 (*) 70 - 99 mg/dL   BUN 29 (*) 6 - 23 mg/dL   Creatinine, Ser 0.34 (*) 0.50 - 1.10 mg/dL   Calcium 9.6  8.4 - 74.2 mg/dL   GFR calc non Af Amer 39 (*) >90 mL/min   GFR calc Af Amer 45 (*) >90 mL/min  URINALYSIS, ROUTINE W REFLEX MICROSCOPIC      Component Value Range   Color, Urine YELLOW  YELLOW   APPearance CLEAR  CLEAR   Specific Gravity, Urine 1.020  1.005 - 1.030   pH 5.5  5.0 - 8.0   Glucose, UA NEGATIVE  NEGATIVE mg/dL   Hgb urine dipstick NEGATIVE  NEGATIVE   Bilirubin Urine NEGATIVE  NEGATIVE   Ketones, ur NEGATIVE  NEGATIVE mg/dL   Protein, ur NEGATIVE  NEGATIVE mg/dL   Urobilinogen, UA 0.2  0.0 - 1.0 mg/dL   Nitrite NEGATIVE  NEGATIVE   Leukocytes, UA MODERATE (*) NEGATIVE  LACTIC ACID, PLASMA      Component Value Range   Lactic Acid, Venous 2.4 (*) 0.5 - 2.2 mmol/L  URINE MICROSCOPIC-ADD ON      Component Value Range   Squamous Epithelial / LPF RARE  RARE   WBC, UA 7-10  <3 WBC/hpf   Urine-Other MUCOUS PRESENT     Dg Chest Portable 1 View  06/01/2012  *RADIOLOGY REPORT*  Clinical Data: Weakness, shortness of breath.  PORTABLE CHEST - 1 VIEW  Comparison: 03/31/2012  Findings: Low lung volumes.  Moderate sized hiatal hernia.  No confluent airspace opacities or effusions.  Heart size is borderline, accentuated by the low volumes.  Posterior spinal rods noted in the visualized thoracolumbar spine.  IMPRESSION: Very low lung volumes.   Original Report Authenticated By: Charlett Nose, M.D.       1. Urinary tract infection   2. Leukocytosis   3. Renal  insufficiency       MDM  Fever or with less likely sources being pneumonia and urinary tract infection. She'll be given IV fluids and chest x-ray obtained as well as urinalysis. Review of old chart shows several admissions for fever  and infection.  Chest x-ray does not show evidence of pneumonia. Urinalysis does show mild pyuria which is the only obvious source for infection. Lactate was mildly elevated at 2.4. She remains hemodynamically stable and heart rate has come down under 110 with IV hydration. She started on Rocephin to treat presumed UTI. Because of her being immunosuppressed on long-standing prednisone therapy, it was elected to admit her to the hospital for additional treatment. Case is discussed with Dr. Blake Divine of [facility] who agrees to admit the patient.      Dione Booze, MD 06/01/12 [phone]

## 2012-06-01 NOTE — ED Notes (Addendum)
PER EMS- pt picked up from [facility] with c/o fever and "not feeling good" since 7a.  Facility also reports suspected blood in stool, however, hemoccult was negative.  Pt denies pain.  Pt has hx of sepsis, stroke, and HTN.  Pt alert and oriented.   Denies LOC.

## 2012-06-01 NOTE — H&P (Signed)
[facility] History and Physical  [name] ZOX:[id number] DOB: 20-Nov-1929 DOA: 06/01/2012  Referring physician: dr Preston Fleeting PCP: Oneal Grout, MD    Chief Complaint: sent from NH for fever  HPI: [name] is a 78 y.o. female with prior h/o MI, RA, Hypothyroidism, PUD, dvt on xarelto was sent in for fever, nauseated, vomiting, cold and congestion. Pt denies cough, chest pain, . Her son at bedside reports that she complained of loose stools since yesterday. On arrival to ED , she was found to have a temp of 102, in acute renal failure, abnormal urine. She is being admitted to hospitalist service for evaluation of fever and possible gastroenteritis.   Review of Systems: The patient denies anorexia, fever, weight loss,, vision loss, decreased hearing, hoarseness, chest pain, syncope,  peripheral edema, balance deficits, hemoptysis, abdominal pain, melena, hematochezia, severe indigestion/heartburn, hematuria, incontinence, genital sores, muscle weakness, suspicious skin lesions, transient blindness, difficulty walking, depression, unusual weight change, abnormal bleeding, enlarged lymph nodes, angioedema, and breast masses.    Past Medical History  Diagnosis Date  . SOB (shortness of breath)   . MI (myocardial infarction)   . Poor appetite   . Arthritis   . Fatigue   . Right ear pain   . Renal insufficiency   . RA (rheumatoid arthritis)   . PUD (peptic ulcer disease)   . GI bleed 2005  . GERD (gastroesophageal reflux disease)   . Hiatal hernia   . Hypothyroidism   . Osteoporosis   . Depression   . Anxiety   . Coronary artery disease   . CVA (cerebral vascular accident)   . TIA (transient ischemic attack)   . Fall   . DVT of lower extremity (deep venous thrombosis)    Past Surgical History  Procedure Date  . Cardiac catheterization     SHOWED RUPTURE PLAQUE IN THE LAD. THE LAD IS NONOBSTRUCTIVE WITH ONLY 30-40% STENOSIS  . Nstemi 06/2010  . Spinal fusion surgery    . Knee surgery    Social History:  reports that she has never smoked. She has never used smokeless tobacco. She reports that she does not drink alcohol or use illicit drugs. where does patient live--home,  Allergies  Allergen Reactions  . Codeine     sick  . Morphine And Related Other (See Comments)    unknown  . Percocet (Oxycodone-Acetaminophen)     unknown  . Plavix (Clopidogrel Bisulfate) Other (See Comments)    unknown  . Sulfur Other (See Comments)    unknown    Family History  Problem Relation Age of Onset  . Kidney disease Mother   . Kidney disease Brother     Prior to Admission medications   Medication Sig Start Date End Date Taking? Authorizing Provider  acetaminophen (TYLENOL) 650 MG suppository Place 650 mg rectally every 4 (four) hours as needed. Pain   Yes Historical Provider, MD  Ascorbic Acid (VITAMIN C PO) Take 500 mg by mouth daily.    Yes Historical Provider, MD  aspirin 81 MG chewable tablet Chew 81 mg by mouth daily.   Yes Historical Provider, MD  diazepam (VALIUM) 5 MG tablet Take 5 mg by mouth at bedtime.   Yes Historical Provider, MD  esomeprazole (NEXIUM) 40 MG capsule Take 40 mg by mouth daily before breakfast.    Yes Historical Provider, MD  ferrous sulfate 325 (65 FE) MG tablet Take 325 mg by mouth daily with breakfast.   Yes Historical Provider, MD  folic  acid (FOLVITE) 1 MG tablet Take 1 mg by mouth daily.    Yes Historical Provider, MD  HYDROcodone-acetaminophen (VICODIN) 5-500 MG per tablet Take 1 tablet by mouth every 6 (six) hours as needed for pain. Pain 04/05/12  Yes Rhetta Mura, MD  levothyroxine (SYNTHROID, LEVOTHROID) 50 MCG tablet Take 50 mcg by mouth daily.    Yes Historical Provider, MD  lisinopril-hydrochlorothiazide (PRINZIDE,ZESTORETIC) 20-12.5 MG per tablet Take 1 tablet by mouth daily.    Yes Historical Provider, MD  metoprolol (LOPRESSOR) 50 MG tablet Take 50 mg by mouth 2 (two) times daily.    Yes Historical Provider, MD   miconazole (MICOTIN) 2 % cream Apply 1 application topically 2 (two) times daily. Buttocks and Groin until healed   Yes Historical Provider, MD  Multiple Vitamin (MULTIVITAMIN) tablet Take 1 tablet by mouth daily.     Yes Historical Provider, MD  Omega-3 Fatty Acids (FISH OIL) 1200 MG CAPS Take 1,200 mg by mouth daily.   Yes Historical Provider, MD  potassium chloride (KLOR-CON M15) 15 MEQ tablet Take 2 tablets (30 mEq total) by mouth daily. 03/19/12  Yes Adeline C Viyuoh, MD  predniSONE (DELTASONE) 10 MG tablet Take 10 mg by mouth daily.   Yes Historical Provider, MD  promethazine (PHENERGAN) 25 MG tablet Take 25 mg by mouth every 6 (six) hours as needed. nausea   Yes Historical Provider, MD  rivaroxaban (XARELTO) 10 MG TABS tablet Take 20 mg by mouth daily.   Yes Historical Provider, MD  rosuvastatin (CRESTOR) 20 MG tablet Take 20 mg by mouth daily.    Yes Historical Provider, MD  sertraline (ZOLOFT) 50 MG tablet Take 50 mg by mouth daily.    Yes Historical Provider, MD   Physical Exam: Filed Vitals:   06/01/12 1500 06/01/12 1600 06/01/12 1700 06/01/12 1903  BP: 122/58 124/57 120/55 115/50  Pulse: 111 111 108 106  Temp:    100.8 F (38.2 C)  TempSrc:    Oral  Resp: 21 24 25 20   Height:    5\' 5"  (1.651 m)  Weight:    68.947 kg (152 lb)  SpO2: 100% 100% 100% 98%   Constitutional: Vital signs reviewed.  Patient is a well-developed and well-nourished in no acute distress and cooperative with exam. Alert and oriented x3.  Head: Normocephalic and atraumatic Mouth: no erythema or exudates, MMM Eyes: PERRL, EOMI, conjunctivae normal, No scleral icterus.  Neck: Supple, Trachea midline normal ROM, No JVD, mass, thyromegaly, or carotid bruit present.  Cardiovascular: RRR, S1 normal, S2 normal, no MRG, pulses symmetric and intact bilaterally Pulmonary/Chest: CTAB, no wheezes, rales, or rhonchi Abdominal: Soft. Non-tender, non-distended, bowel sounds are normal, no masses, organomegaly, or  guarding present.  Musculoskeletal: No joint deformities, erythema, or stiffness, ROM full and no nontender Neurological:alert , slightly confused. left hemiparesis.  Skin: Warm, dry and intact. No rash, cyanosis, or clubbing.  Psychiatric: Normal mood and affect. speech and behavior is normal.  Labs on Admission:  Basic Metabolic Panel:  Lab 06/01/12 1610  NA 138  K 4.1  CL 100  CO2 27  GLUCOSE 117*  BUN 29*  CREATININE 1.26*  CALCIUM 9.6  MG --  PHOS --   Liver Function Tests: No results found for this basename: AST:5,ALT:5,ALKPHOS:5,BILITOT:5,PROT:5,ALBUMIN:5 in the last 168 hours No results found for this basename: LIPASE:5,AMYLASE:5 in the last 168 hours No results found for this basename: AMMONIA:5 in the last 168 hours CBC:  Lab 06/01/12 1245  WBC 19.1*  NEUTROABS 16.1*  HGB 10.2*  HCT 32.3*  MCV 86.4  PLT 186   Cardiac Enzymes: No results found for this basename: CKTOTAL:5,CKMB:5,CKMBINDEX:5,TROPONINI:5 in the last 168 hours  BNP (last 3 results)  Basename 03/18/12 0400 03/17/12 0530  PROBNP 3700.0* 1429.0*   CBG: No results found for this basename: GLUCAP:5 in the last 168 hours  Radiological Exams on Admission: Dg Chest Portable 1 View  06/01/2012  *RADIOLOGY REPORT*  Clinical Data: Weakness, shortness of breath.  PORTABLE CHEST - 1 VIEW  Comparison: 03/31/2012  Findings: Low lung volumes.  Moderate sized hiatal hernia.  No confluent airspace opacities or effusions.  Heart size is borderline, accentuated by the low volumes.  Posterior spinal rods noted in the visualized thoracolumbar spine.  IMPRESSION: Very low lung volumes.   Original Report Authenticated By: Charlett Nose, M.D.     EKG: pending   Assessment/Plan Active Problems: 1. Fever/congestion: will order influenza PCR. Blood cultures done, UA slightly abnormal, urine cultures are ordered. She got a dose of rocephin in ED. Continue rocephin till urine cultures are negative.  2. Nausea/  vomiting/ loose stools: possibly acute gastroenteritis vs gastritis from chronic steroids. Will order c diff pcr as she is from SNF. Fluids and anti emetics. Clear liquid diet for now . Advance diet as tolerated.  3. Acute renal failure: probably from dehydration. Gentle hydration and repeat labs in am.   4.  Leukocytosis: probably from gastroenteritis vs UTI. Patient is also on chronic steroids, but her last WBC count on steroids is 10.9.  5 . Anemia : normocytic probably AOCD. Will continue to monitor.   6. Tachycardia; probably from fever. 12 lead EKG ordered.   7. Hypothyroidism: resume home synthroid.   8. Hypertension; controlled resme lopressor. Held lisinopril for renal insufficiency.   9. H/o CVA : on aspirin. Pt has left hemiparesis. Get PT/OT EVAL   10 RA: on chronic steroids.   11, DVT : on xarelto.      Code Status: FULL CODE Family Communication: SON AT BEDSIDE Disposition Plan: 1-2 DAYS.    [facility] Pager [phone]  If 7PM-7AM, please contact night-coverage [email or web address] Password [email or web address] 06/01/2012, 7:40 PM

## 2012-06-02 DIAGNOSIS — A0471 Enterocolitis due to Clostridium difficile, recurrent: Secondary | ICD-10-CM

## 2012-06-02 DIAGNOSIS — E860 Dehydration: Secondary | ICD-10-CM

## 2012-06-02 DIAGNOSIS — A0472 Enterocolitis due to Clostridium difficile, not specified as recurrent: Principal | ICD-10-CM

## 2012-06-02 HISTORY — DX: Enterocolitis due to Clostridium difficile, recurrent: A04.71

## 2012-06-02 LAB — BASIC METABOLIC PANEL
BUN: 26 mg/dL — ABNORMAL HIGH (ref 6–23)
Chloride: 106 mEq/L (ref 96–112)
GFR calc Af Amer: 40 mL/min — ABNORMAL LOW (ref >90)
Glucose, Bld: 129 mg/dL — ABNORMAL HIGH (ref 70–99)
Potassium: 4.3 mEq/L (ref 3.5–5.1)
Sodium: 140 mEq/L (ref 135–145)

## 2012-06-02 LAB — CLOSTRIDIUM DIFFICILE BY PCR: Toxigenic C. Difficile by PCR: POSITIVE — AB

## 2012-06-02 LAB — CBC
HCT: 26.9 % — ABNORMAL LOW (ref 36.0–46.0)
Hemoglobin: 8.3 g/dL — ABNORMAL LOW (ref 12.0–15.0)
MCH: 27.4 pg (ref 26.0–34.0)
MCHC: 30.9 g/dL (ref 30.0–36.0)
RBC: 3.03 MIL/uL — ABNORMAL LOW (ref 3.87–5.11)

## 2012-06-02 LAB — MRSA PCR SCREENING: MRSA by PCR: NEGATIVE

## 2012-06-02 MED ORDER — BIOTENE DRY MOUTH MT LIQD
15.0000 mL | Freq: Two times a day (BID) | OROMUCOSAL | Status: DC
  Administered 2012-06-03: 15 mL via OROMUCOSAL

## 2012-06-02 MED ORDER — SODIUM CHLORIDE 0.9 % IV SOLN
INTRAVENOUS | Status: DC
  Administered 2012-06-02: 04:00:00 via INTRAVENOUS

## 2012-06-02 MED ORDER — CHLORHEXIDINE GLUCONATE 0.12 % MT SOLN
15.0000 mL | Freq: Two times a day (BID) | OROMUCOSAL | Status: DC
  Administered 2012-06-02 – 2012-06-04 (×4): 15 mL via OROMUCOSAL
  Filled 2012-06-02 (×8): qty 15

## 2012-06-02 MED ORDER — HYDROCORTISONE SOD SUCCINATE 100 MG IJ SOLR
50.0000 mg | Freq: Two times a day (BID) | INTRAMUSCULAR | Status: AC
  Administered 2012-06-02 – 2012-06-03 (×3): 50 mg via INTRAVENOUS
  Filled 2012-06-02 (×4): qty 1

## 2012-06-02 MED ORDER — METRONIDAZOLE 500 MG PO TABS
500.0000 mg | ORAL_TABLET | Freq: Three times a day (TID) | ORAL | Status: DC
  Administered 2012-06-02 – 2012-06-04 (×7): 500 mg via ORAL
  Filled 2012-06-02 (×9): qty 1

## 2012-06-02 NOTE — Progress Notes (Addendum)
[facility] PROGRESS NOTE  [name] ZOX:[id number] DOB: 04-Nov-1929 DOA: 06/01/2012 PCP: Oneal Grout, MD  Assessment/Plan: Active Problems:  Renal insufficiency  RA (rheumatoid arthritis)  PUD (peptic ulcer disease)  Hypothyroidism  Stroke  Dehydration  Gastroenteritis  Leukocytosis  Hypertension  Fever    1. Fever/congestion: Patient presented with URI symptoms, and was found to have a pyrexia of 102 degrees Farenheit in the ED. Wcc was elevated at 19.1.n Influenza PCR is negative and blood cultures are pending. Managing with supportive treatment. Patient was afebrile overnight, and wcc is already trending down.  2. Acute gastroenteritis/C. Difficile infection: Patient had loose stools pre-admission, as well as nausea. C. Difficile PCR is positive. This may be responsible for patient's leukocytosis. Managing with clears, iv fluids and oral Flagyl. Diet will be advanced as tolerated. Have discontinued PPI.  3. Dehydration/Acute renal failure: Creatinine was 1.26, BUN 29 on presentation, due to dehydration. Managing with iv fluids as described above, and improvement is anticipated. dehydration. Following renal indices.  4. Anemia: This is normocytic. And due to chronic disease. AOCD. At presentation, Hb was 10.2, and today, it is down to 8.3, probably due to rehydration. As patient is on Xarelto, will check stool for FOBT. She has no clinically overt bleeding at this time.  5. Hypothyroidism: On thyroxine replacement therapy. TSH is 0.916. 6. Hypertension: Borderline low, due to volume depletion. Lopressor/Lisinopril on hold. Held lisinopril for renal insufficiency.  7. Query UTI: U/A demonstrates a mild pyuria. Patient was commenced on iv rocephin on 06/01/12, which we shall continue for now. Urine culture is pending. Aiming on 3 days of Rocephin, in view of C. Difficile infection.  8. H/o CVA : On Aspirin. Pt has residual left hemiparesis. PT/OT EVAL  9.  RA: On chronic  steroids therapy. Will place patient on stress doses of steroids, for now.  10. History of CAD: Stable/asymptomic.    Code Status: Full Code.  Family Communication:  Disposition Plan: To be determined.    Brief narrative: 78 y.o. female with history of HTN, CAD, s/p NSTEMI, s/p CVA, previous TIA, depression/Anxiety, GERD, previous GI bleed, hiatal hernia, PUD, OA, osteoporosis, hypothyroidism, RA on chronic steroid therapy, Hypothyroidism, PUD, DVT on Xarelto, presenting with fever, nausea, vomiting, cold and congestion. Pt denies cough, chest pain. Per son, she had loose stools since 05/31/12. On arrival to ED , she was found to have a temp of 102, in acute renal failure, with abnormal urine. She was admitted for further management.    Consultants:  N/A  Procedures:  CXR.  Antibiotics: HPI/Subjective: Feels better.   Objective: Vital signs in last 24 hours: Temp:  [98.7 F (37.1 C)-102.4 F (39.1 C)] 98.7 F (37.1 C) (12/04 0620) Pulse Rate:  [86-111] 86  (12/04 0620) Resp:  [18-28] 18  (12/04 0620) BP: (98-136)/(50-67) 98/58 mmHg (12/04 0620) SpO2:  [94 %-100 %] 94 % (12/04 0620) Weight:  [68.947 kg (152 lb)] 68.947 kg (152 lb) (12/03 1903) Weight change:  Last BM Date: 06/01/12  Intake/Output from previous day:       Physical Exam: General: Comfortable, alert, communicative, not short of breath at rest.  HEENT:  Mild clinical pallor, no jaundice, no conjunctival injection or discharge. NECK:  Supple, JVP not seen, no carotid bruits, no palpable lymphadenopathy, no palpable goiter. CHEST:  Clinically clear to auscultation, no wheezes, no crackles. HEART:  Sounds 1 and 2 heard, normal, regular, no murmurs. ABDOMEN:  Full, soft, non-tender, no palpable organomegaly, no palpable masses, normal  bowel sounds. GENITALIA:  Not examined. LOWER EXTREMITIES:  No pitting edema, palpable peripheral pulses. MUSCULOSKELETAL SYSTEM:  Generalized osteoarthritic changes,  otherwise, normal. CENTRAL NERVOUS SYSTEM:  Residual left hemiparesis.   Lab Results:  Basename 06/02/12 0355 06/01/12 1245  WBC 15.1* 19.1*  HGB 8.3* 10.2*  HCT 26.9* 32.3*  PLT 167 186    Basename 06/02/12 0355 06/01/12 1245  NA 140 138  K 4.3 4.1  CL 106 100  CO2 25 27  GLUCOSE 129* 117*  BUN 26* 29*  CREATININE 1.39* 1.26*  CALCIUM 8.3* 9.6   Recent Results (from the past 240 hour(s))  CULTURE, BLOOD (ROUTINE X 2)     Status: Normal (Preliminary result)   Collection Time   06/01/12 12:45 PM      Component Value Range Status Comment   Specimen Description BLOOD LEFT ANTECUBITAL   Final    Special Requests BOTTLES DRAWN AEROBIC AND ANAEROBIC   Final    Culture  Setup Time 06/01/2012 23:05   Final    Culture     Final    Value:        BLOOD CULTURE RECEIVED NO GROWTH TO DATE CULTURE WILL BE HELD FOR 5 DAYS BEFORE ISSUING A FINAL NEGATIVE REPORT   Report Status PENDING   Incomplete   CULTURE, BLOOD (ROUTINE X 2)     Status: Normal (Preliminary result)   Collection Time   06/01/12 12:50 PM      Component Value Range Status Comment   Specimen Description BLOOD LEFT WRIST   Final    Special Requests BOTTLES DRAWN AEROBIC ONLY   Final    Culture  Setup Time 06/01/2012 23:06   Final    Culture     Final    Value:        BLOOD CULTURE RECEIVED NO GROWTH TO DATE CULTURE WILL BE HELD FOR 5 DAYS BEFORE ISSUING A FINAL NEGATIVE REPORT   Report Status PENDING   Incomplete   CLOSTRIDIUM DIFFICILE BY PCR     Status: Abnormal   Collection Time   06/02/12  3:11 AM      Component Value Range Status Comment   C difficile by pcr POSITIVE (*) NEGATIVE Final   MRSA PCR SCREENING     Status: Normal   Collection Time   06/02/12  7:57 AM      Component Value Range Status Comment   MRSA by PCR NEGATIVE  NEGATIVE Final      Studies/Results: Dg Chest Portable 1 View  06/01/2012  *RADIOLOGY REPORT*  Clinical Data: Weakness, shortness of breath.  PORTABLE CHEST - 1 VIEW  Comparison:  03/31/2012  Findings: Low lung volumes.  Moderate sized hiatal hernia.  No confluent airspace opacities or effusions.  Heart size is borderline, accentuated by the low volumes.  Posterior spinal rods noted in the visualized thoracolumbar spine.  IMPRESSION: Very low lung volumes.   Original Report Authenticated By: Charlett Nose, M.D.     Medications: Scheduled Meds:   . antiseptic oral rinse  15 mL Mouth Rinse q12n4p  . aspirin  81 mg Oral Daily  . atorvastatin  40 mg Oral q1800  . cefTRIAXone (ROCEPHIN) IVPB 1 gram/50 mL D5W  1 g Intravenous Q24H  . chlorhexidine  15 mL Mouth Rinse BID  . diazepam  5 mg Oral QHS  . folic acid  1 mg Oral Daily  . levothyroxine  50 mcg Oral QAC breakfast  . metoprolol  50 mg Oral BID  .  metroNIDAZOLE  500 mg Oral Q8H  . pantoprazole  40 mg Oral BID AC  . potassium chloride  30 mEq Oral Daily  . predniSONE  10 mg Oral Daily  . rivaroxaban  20 mg Oral Q supper  . sertraline  50 mg Oral Daily  . [COMPLETED] sodium chloride  1,000 mL Intravenous Once  . [COMPLETED] sodium chloride  1,000 mL Intravenous Once  . [DISCONTINUED] sodium chloride   Intravenous STAT  . [DISCONTINUED] pantoprazole  40 mg Oral Daily   Continuous Infusions:   . sodium chloride 75 mL/hr at 06/02/12 0334   PRN Meds:.acetaminophen, HYDROcodone-acetaminophen, ondansetron (ZOFRAN) IV, ondansetron, promethazine    LOS: 1 day   Voula Waln,CHRISTOPHER  [facility] Pager [phone]. If 8PM-8AM, please contact night-coverage at [email or web address], password [facility] 06/02/2012, 12:46 PM  LOS: 1 day

## 2012-06-02 NOTE — Progress Notes (Signed)
Clinical Social Worker continuing to follow for disposition planning. Pt is from [facility] and plan is to return at discharge. Clinical Social Worker spoke with [employer] who stated pt family has placed a bed hold and pt can return to [facility] when medically stable. Clinical Social Worker to continue to follow and facilitate pt discharge needs when pt medically stable for discharge.  Jacklynn Lewis, MSW, LCSWA  Clinical Social Work [phone]

## 2012-06-02 NOTE — Evaluation (Signed)
Physical Therapy Evaluation Patient Details Name: [name] MRN: [id number] DOB: 19-May-1930 Today's Date: 06/02/2012 Time: 4132-4401 PT Time Calculation (min): 31 min  PT Assessment / Plan / Recommendation Clinical Impression  78 yo female  resident of [facility] who is admitted with fever, nausea and vomiting.  She is found to have ARF and +cdiff. Pt has history of CVA with some residual L hemiparesis with mobility impairment.  Recommend another session of PT to return to previous level and decrease current burden of care    PT Assessment  Patient needs continued PT services    Follow Up Recommendations  SNF    Does the patient have the potential to tolerate intense rehabilitation      Barriers to Discharge        Equipment Recommendations  None recommended by PT    Recommendations for Other Services OT consult   Frequency Min 3X/week    Precautions / Restrictions Precautions Precautions: Fall Precaution Comments: Left sided residual hemiparesis Restrictions Weight Bearing Restrictions: No   Pertinent Vitals/Pain No c/o pain       Mobility  Bed Mobility Bed Mobility: Sit to Supine Sit to Supine: 4: Min assist Details for Bed Mobility Assistance: Needs assist to lift legs up onto bed and square shoulders up  Transfers Transfers: Sit to Stand;Stand to Sit Sit to Stand: 3: Mod assist Stand to Sit: 3: Mod assist Details for Transfer Assistance: pt needs cues to push up with arms and has decreased ability to achieve full trunk extension in standing Pt transferred to [facility] for small BM and then ambulated back to bed Ambulation/Gait Ambulation/Gait Assistance: 3: Mod assist Ambulation Distance (Feet): 5 Feet Assistive device: Rolling walker Ambulation/Gait Assistance Details: Assist to step around to [facility] and then back to bed. Needs help to direct RW  and provide assist for trunk stability Gait Pattern: Decreased step length - right;Decreased step length -  left;Step-to pattern;Trunk flexed Gait velocity: decreased General Gait Details: pt stays bent over RW and had difficulty with weight shift and stepping. She is unable to activate core to maintain trunk extension for repeated steps in ambulation Stairs: No Wheelchair Mobility Wheelchair Mobility: No    Shoulder Instructions     Exercises Other Exercises Other Exercises:   knee to chest and then full leg extension to stretch out hip and knee flexors Other Exercises: bridging to activate core and hip extensors   PT Diagnosis: Difficulty walking;Abnormality of gait;Generalized weakness  PT Problem List: Decreased strength;Decreased range of motion;Decreased activity tolerance;Decreased mobility;Decreased safety awareness;Decreased knowledge of use of DME PT Treatment Interventions: DME instruction;Gait training;Functional mobility training;Therapeutic activities;Therapeutic exercise;Balance training;Patient/family education   PT Goals Acute Rehab PT Goals PT Goal Formulation: With patient Time For Goal Achievement: 06/16/12 Potential to Achieve Goals: Good Pt will go Supine/Side to Sit: with modified independence;with rail PT Goal: Supine/Side to Sit - Progress: Goal set today Pt will go Sit to Supine/Side: with modified independence;with rail PT Goal: Sit to Supine/Side - Progress: Goal set today Pt will go Sit to Stand: with modified independence;with upper extremity assist PT Goal: Sit to Stand - Progress: Goal set today Pt will go Stand to Sit: with modified independence;with upper extremity assist PT Goal: Stand to Sit - Progress: Goal set today Pt will Stand: with modified independence;3 - 5 min;with unilateral upper extremity support PT Goal: Stand - Progress: Goal set today Pt will Ambulate: 16 - 50 feet;with supervision;with least restrictive assistive device PT Goal: Ambulate - Progress: Goal  set today  Visit Information  Last PT Received On: 06/02/12 Assistance Needed:  +2    Subjective Data  Subjective: "I walk a little bit with my walker" Patient Stated Goal: to get stronger   Prior Functioning  Home Living Lives With: Alone Available Help at Discharge: Skilled Nursing Facility Home Adaptive Equipment: Walker - rolling Prior Function Level of Independence: Needs assistance Communication Communication: Expressive difficulties;Receptive difficulties;Other (comment) (pt states she has residual problems from stroke)    Cognition  Overall Cognitive Status: Appears within functional limits for tasks assessed/performed Arousal/Alertness: Awake/alert Orientation Level: Appears intact for tasks assessed Behavior During Session: [facility] for tasks performed Cognition - Other Comments: Pt pleasant with good sense of humor, joking with staff    Extremity/Trunk Assessment Right Lower Extremity Assessment RLE ROM/Strength/Tone: Deficits RLE ROM/Strength/Tone Deficits: pt with some tightness in hip flexors and knee flexors, but able to achieve nearly full extension in supine RLE Sensation: WFL - Light Touch RLE Coordination: WFL - gross/fine motor Left Lower Extremity Assessment LLE ROM/Strength/Tone: Deficits LLE ROM/Strength/Tone Deficits: tightness in hip and knee flexors with decrease in terminal extension.  pt lack dorsiflexion to neutral .  Some lag in muscle response to movement Trunk Assessment Trunk Assessment: Kyphotic;Other exceptions Trunk Exceptions: obese abdomen. flat low back with decrease lumbar ROM.  pt reports history of back surgery??   Balance Balance Balance Assessed: Yes Static Sitting Balance Static Sitting - Balance Support: No upper extremity supported;Feet supported Static Sitting - Level of Assistance: 7: Independent Static Sitting - Comment/# of Minutes: pt keeps trunk flexed  End of Session PT - End of Session Equipment Utilized During Treatment: Gait belt Activity Tolerance: Patient tolerated treatment well Patient left: in  bed;with call bell/phone within reach;with nursing in room Nurse Communication: Mobility status  GP     Rosey Bath K. [place], [place]  [phone] 06/02/2012, 3:52 PM

## 2012-06-03 DIAGNOSIS — D649 Anemia, unspecified: Secondary | ICD-10-CM

## 2012-06-03 LAB — CBC
Hemoglobin: 7.3 g/dL — ABNORMAL LOW (ref 12.0–15.0)
MCHC: 31.7 g/dL (ref 30.0–36.0)
RBC: 2.62 MIL/uL — ABNORMAL LOW (ref 3.87–5.11)
WBC: 10 10*3/uL (ref 4.0–10.5)

## 2012-06-03 LAB — URINE CULTURE

## 2012-06-03 LAB — BASIC METABOLIC PANEL
GFR calc non Af Amer: 43 mL/min — ABNORMAL LOW (ref >90)
Glucose, Bld: 110 mg/dL — ABNORMAL HIGH (ref 70–99)
Potassium: 4.2 mEq/L (ref 3.5–5.1)
Sodium: 141 mEq/L (ref 135–145)

## 2012-06-03 MED ORDER — METOPROLOL TARTRATE 50 MG PO TABS
50.0000 mg | ORAL_TABLET | Freq: Two times a day (BID) | ORAL | Status: DC
  Administered 2012-06-03 – 2012-06-04 (×2): 50 mg via ORAL
  Filled 2012-06-03 (×3): qty 1

## 2012-06-03 MED ORDER — FUROSEMIDE 10 MG/ML IJ SOLN
20.0000 mg | Freq: Once | INTRAMUSCULAR | Status: AC
  Administered 2012-06-03: 20 mg via INTRAVENOUS
  Filled 2012-06-03: qty 2

## 2012-06-03 MED ORDER — PREDNISONE 20 MG PO TABS
20.0000 mg | ORAL_TABLET | Freq: Every day | ORAL | Status: DC
  Administered 2012-06-04: 20 mg via ORAL
  Filled 2012-06-03 (×4): qty 1

## 2012-06-03 NOTE — Discharge Summary (Addendum)
Physician Discharge Summary  [name] XBM:[id number] DOB: 1929-07-13 DOA: 06/01/2012  PCP: Oneal Grout, MD  Admit date: 06/01/2012 Discharge date: 06/04/2012  Time spent: 40 minutes  Recommendations for Outpatient Follow-up:  1. Follow up with primary MD.   Discharge Diagnoses:  Active Problems:  Renal insufficiency  RA (rheumatoid arthritis)  PUD (peptic ulcer disease)  Hypothyroidism  Stroke  Dehydration  Gastroenteritis  Leukocytosis  Hypertension  Fever  Clostridium difficile diarrhea  Anemia   Discharge Condition: Satisfactory  Diet recommendation: Heart-Healthy.   Filed Weights   06/01/12 1903  Weight: 68.947 kg (152 lb)    History of present illness:  78 y.o. female with history of HTN, CAD, s/p NSTEMI, s/p CVA, previous TIA, depression/Anxiety, GERD, previous GI bleed, hiatal hernia, PUD, OA, osteoporosis, hypothyroidism, RA on chronic steroid therapy, Hypothyroidism, PUD, DVT on Xarelto, presenting with fever, nausea, vomiting, cold and congestion. Pt denies cough, chest pain. Per son, she had loose stools since 05/31/12. On arrival to ED , she was found to have a temp of 102, in acute renal failure, with abnormal urine. She was admitted for further management.    Hospital Course:  1. Fever/congestion: Patient presented with URI symptoms, and was found to have a pyrexia of 102 degrees Farenheit in the ED. Wcc was elevated at 19.1. Influenza PCR was negative and blood cultures remained negative. She was managed with supportive treatment, remained afebrile since admission, and wcc normalized at 10.0 on 06/03/12.  2. Acute gastroenteritis/C. Difficile infection: Patient had loose stools pre-admission, as well as nausea. C. Difficile PCR was positive. This was likely the culprit for patient's leukocytosis. She was placed initially on clears, but by 06/03/12, was able to tolerate an advanced diet without any deleterious effects. She had no further episodes of  diarrhea since 06/02/12 and was managed with oral Flagyl. A 10-day course of therapy is planned, to be concluded on 06/11/12. PPI was discontinued on 06/02/12. Patient has been commenced on Probiotics.  3. Dehydration/Acute renal failure: Creatinine was 1.26, BUN 29 on presentation, due to dehydration. Managed with iv fluids as described above, and and ACE-i, held. As of 06/03/12, creatine was 1.17. IV fluids were discontinued on that date. Creatinine was 1.13 on 06/04/12.  4. Anemia: This is normocytic and due to chronic disease. At presentation, Hb was 10.2, and then drifted down, due to iv fluid hydration. On 06/03/12, HB was 7.3, necessitating transfusion of 1 unit PRBC, with a bump in HB to 8.7 on 06/04/12. Patient is on Xarelto, but she had no clinically overt bleeding during her hopitalization.  5. Hypothyroidism: On thyroxine replacement therapy. TSH was 0.916.  6. Hypertension: Patient's BP was initially borderline low, due to volume depletion, so Lopressor/Lisinopril were placed on hold. As BP started creeping up on 06/03/12, so we have have reinstated Lopressor. ACE-i remains on hold, until reviewed by PMD.  7. Query UTI: U/A demonstrated a mild pyuria. Patient was commenced on iv Rocephin on 06/01/12. Urine culture demonstrated 6000 colonies of yeast, which does not appear significant. Rocephin was discontinued on 06/03/12.  8. H/o CVA : On Aspirin. Pt has residual left hemiparesis. PT/OT evaluation was carried out, and recommendations, implemented.  9. RA: On chronic steroids therapy. She was placed on stress doses of steroids during this hospitalization, to cover her acute illness.  10. History of CAD: Stable/asymptomatic.      Procedures:  See below.   Consultations:  N/A.   Discharge Exam: Filed Vitals:   06/04/12 0007 06/04/12  0107 06/04/12 0207 06/04/12 0502  BP: 139/78 143/76 154/86 153/63  Pulse: 79 75 76 73  Temp: 98.2 F (36.8 C) 98.1 F (36.7 C) 97.6 F (36.4 C) 98.6 F (37  C)  TempSrc:  Oral Oral Oral  Resp: 16 15 16 18   Height:      Weight:      SpO2:  98%  100%    General: Comfortable, alert, communicative, not short of breath at rest.  HEENT: Moderate clinical pallor, no jaundice, no conjunctival injection or discharge.  NECK: Supple, JVP not seen, no carotid bruits, no palpable lymphadenopathy, no palpable goiter.  CHEST: Clinically clear to auscultation, no wheezes, no crackles.  HEART: Sounds 1 and 2 heard, normal, regular, no murmurs.  ABDOMEN: Full, soft, non-tender, no palpable organomegaly, no palpable masses, normal bowel sounds.  GENITALIA: Not examined.  LOWER EXTREMITIES: No pitting edema, palpable peripheral pulses.  MUSCULOSKELETAL SYSTEM: Generalized osteoarthritic changes, otherwise, normal.  CENTRAL NERVOUS SYSTEM: Residual left hemiparesis.      Discharge Instructions      Discharge Orders    Future Orders Please Complete By Expires   Diet - low sodium heart healthy      Increase activity slowly          Medication List     As of 06/04/2012 11:30 AM    STOP taking these medications         lisinopril-hydrochlorothiazide 20-12.5 MG per tablet   Commonly known as: PRINZIDE,ZESTORETIC      potassium chloride SA 15 MEQ tablet   Commonly known as: KLOR-CON M15      TAKE these medications         acetaminophen 650 MG suppository   Commonly known as: TYLENOL   Place 650 mg rectally every 4 (four) hours as needed. Pain      aspirin 81 MG chewable tablet   Chew 81 mg by mouth daily.      diazepam 5 MG tablet   Commonly known as: VALIUM   Take 1 tablet (5 mg total) by mouth at bedtime.      esomeprazole 40 MG capsule   Commonly known as: NEXIUM   Take 40 mg by mouth daily before breakfast.      ferrous sulfate 325 (65 FE) MG tablet   Take 325 mg by mouth daily with breakfast.      Fish Oil 1200 MG Caps   Take 1,200 mg by mouth daily.      folic acid 1 MG tablet   Commonly known as: FOLVITE   Take 1 mg by  mouth daily.      HYDROcodone-acetaminophen 5-500 MG per tablet   Commonly known as: VICODIN   Take 1 tablet by mouth every 6 (six) hours as needed for pain. Pain      levothyroxine 50 MCG tablet   Commonly known as: SYNTHROID, LEVOTHROID   Take 50 mcg by mouth daily.      metoprolol 50 MG tablet   Commonly known as: LOPRESSOR   Take 50 mg by mouth 2 (two) times daily.      metroNIDAZOLE 500 MG tablet   Commonly known as: FLAGYL   Take 1 tablet (500 mg total) by mouth every 8 (eight) hours.      miconazole 2 % cream   Commonly known as: MICOTIN   Apply 1 application topically 2 (two) times daily. Buttocks and Groin until healed      multivitamin tablet   Take 1 tablet by  mouth daily.      predniSONE 20 MG tablet   Commonly known as: DELTASONE   Take 1 tablet (20 mg total) by mouth daily with breakfast.      predniSONE 10 MG tablet   Commonly known as: DELTASONE   Take 10 mg by mouth daily.      promethazine 25 MG tablet   Commonly known as: PHENERGAN   Take 25 mg by mouth every 6 (six) hours as needed. nausea      rivaroxaban 10 MG Tabs tablet   Commonly known as: XARELTO   Take 20 mg by mouth daily.      rosuvastatin 20 MG tablet   Commonly known as: CRESTOR   Take 20 mg by mouth daily.      saccharomyces boulardii 250 MG capsule   Commonly known as: FLORASTOR   Take 1 capsule (250 mg total) by mouth 2 (two) times daily.      sertraline 50 MG tablet   Commonly known as: ZOLOFT   Take 50 mg by mouth daily.      VITAMIN C PO   Take 500 mg by mouth daily.         Follow-up Information    Schedule an appointment as soon as possible for a visit with Oneal Grout, MD.   Contact information:   [address] [facility] [place] [address] [phone]           The results of significant diagnostics from this hospitalization (including imaging, microbiology, ancillary and laboratory) are listed below for reference.    Significant  Diagnostic Studies: Dg Chest Portable 1 View  06/01/2012  *RADIOLOGY REPORT*  Clinical Data: Weakness, shortness of breath.  PORTABLE CHEST - 1 VIEW  Comparison: 03/31/2012  Findings: Low lung volumes.  Moderate sized hiatal hernia.  No confluent airspace opacities or effusions.  Heart size is borderline, accentuated by the low volumes.  Posterior spinal rods noted in the visualized thoracolumbar spine.  IMPRESSION: Very low lung volumes.   Original Report Authenticated By: Charlett Nose, M.D.     Microbiology: Recent Results (from the past 240 hour(s))  CULTURE, BLOOD (ROUTINE X 2)     Status: Normal (Preliminary result)   Collection Time   06/01/12 12:45 PM      Component Value Range Status Comment   Specimen Description BLOOD LEFT ANTECUBITAL   Final    Special Requests BOTTLES DRAWN AEROBIC AND ANAEROBIC   Final    Culture  Setup Time 06/01/2012 23:05   Final    Culture     Final    Value:        BLOOD CULTURE RECEIVED NO GROWTH TO DATE CULTURE WILL BE HELD FOR 5 DAYS BEFORE ISSUING A FINAL NEGATIVE REPORT   Report Status PENDING   Incomplete   CULTURE, BLOOD (ROUTINE X 2)     Status: Normal (Preliminary result)   Collection Time   06/01/12 12:50 PM      Component Value Range Status Comment   Specimen Description BLOOD LEFT WRIST   Final    Special Requests BOTTLES DRAWN AEROBIC ONLY   Final    Culture  Setup Time 06/01/2012 23:06   Final    Culture     Final    Value:        BLOOD CULTURE RECEIVED NO GROWTH TO DATE CULTURE WILL BE HELD FOR 5 DAYS BEFORE ISSUING A FINAL NEGATIVE REPORT   Report Status PENDING  Incomplete   URINE CULTURE     Status: Normal   Collection Time   06/01/12  4:00 PM      Component Value Range Status Comment   Specimen Description URINE, CATHETERIZED   Final    Special Requests NONE   Final    Culture  Setup Time 06/02/2012 00:19   Final    Colony Count 6,000 COLONIES/ML   Final    Culture YEAST   Final    Report Status 06/03/2012 FINAL   Final    CLOSTRIDIUM DIFFICILE BY PCR     Status: Abnormal   Collection Time   06/02/12  3:11 AM      Component Value Range Status Comment   C difficile by pcr POSITIVE (*) NEGATIVE Final   MRSA PCR SCREENING     Status: Normal   Collection Time   06/02/12  7:57 AM      Component Value Range Status Comment   MRSA by PCR NEGATIVE  NEGATIVE Final      Labs: Basic Metabolic Panel:  Lab 06/04/12 1610 06/03/12 0408 06/02/12 0355 06/01/12 1245  NA 139 141 140 138  K 3.5 4.2 4.3 4.1  CL 106 109 106 100  CO2 25 25 25 27   GLUCOSE 91 110* 129* 117*  BUN 20 24* 26* 29*  CREATININE 1.13* 1.17* 1.39* 1.26*  CALCIUM 8.4 8.2* 8.3* 9.6  MG -- -- -- --  PHOS -- -- -- --   Liver Function Tests: No results found for this basename: AST:5,ALT:5,ALKPHOS:5,BILITOT:5,PROT:5,ALBUMIN:5 in the last 168 hours No results found for this basename: LIPASE:5,AMYLASE:5 in the last 168 hours No results found for this basename: AMMONIA:5 in the last 168 hours CBC:  Lab 06/04/12 0540 06/03/12 0408 06/02/12 0355 06/01/12 1245  WBC 8.5 10.0 15.1* 19.1*  NEUTROABS -- -- -- 16.1*  HGB 8.7* 7.3* 8.3* 10.2*  HCT 27.3* 23.0* 26.9* 32.3*  MCV 84.8 87.8 88.8 86.4  PLT 156 140* 167 186   Cardiac Enzymes: No results found for this basename: CKTOTAL:5,CKMB:5,CKMBINDEX:5,TROPONINI:5 in the last 168 hours BNP: BNP (last 3 results)  Basename 03/18/12 0400 03/17/12 0530  PROBNP 3700.0* 1429.0*   CBG: No results found for this basename: GLUCAP:5 in the last 168 hours     Signed:  Jowell Bossi,CHRISTOPHER  [facility] 06/04/2012, 11:30 AM

## 2012-06-03 NOTE — Progress Notes (Signed)
[facility] PROGRESS NOTE  CALLEEN ALVIS ZOX:[id number] DOB: [birth year] DOA: 06/01/2012 PCP: Oneal Grout, MD  Assessment/Plan: Active Problems:  Renal insufficiency  RA (rheumatoid arthritis)  PUD (peptic ulcer disease)  Hypothyroidism  Stroke  Dehydration  Gastroenteritis  Leukocytosis  Hypertension  Fever  Clostridium difficile diarrhea    1. Fever/congestion: Patient presented with URI symptoms, and was found to have a pyrexia of 102 degrees Farenheit in the ED. Wcc was elevated at 19.1.n Influenza PCR is negative and blood cultures have remained negative. Managing with supportive treatment. Patient was has remained afebrile since admission, and wcc has normalized at 10.0 today.  2. Acute gastroenteritis/C. Difficile infection: Patient had loose stools pre-admission, as well as nausea. C. Difficile PCR is positive. This was likely the culprit for patient's leukocytosis. Managed initially with clears, but by 06/03/12, patient was able to tolerate an advanced diet, without any deleterious effects. She has had no further episodes of diarrhea since 06/02/12. Managing with oral Flagyl, and a 10-day course is planned, to be concluded on 07/02/11. PPI ws discontinued on 06/02/12.  3. Dehydration/Acute renal failure: Creatinine was 1.26, BUN 29 on presentation, due to dehydration. Managed with iv fluids as described above, and as of 06/03/12, creatine was 1.17. Iv fluids have been discontinued.  4. Anemia: This is normocytic. And due to chronic disease. AOCD. At presentation, Hb was 10.2, and has drifted down since, due to iv fluid hydration. Today, HB is 7.3. Transfusing 1 unit PRBC. Patient is on Xarelto, but she has no clinically overt bleeding at this time. FOBT is pending.  5. Hypothyroidism: On thyroxine replacement therapy. TSH is 0.916. 6. Hypertension: Borderline low, due to volume depletion. Lopressor/Lisinopril on hold. Held lisinopril for renal insufficiency. As BP started  creeping up today, have restarted Lopressor.  7. Query UTI: U/A demonstrated a mild pyuria. Patient was commenced on iv Rocephin on 06/01/12. Urine culture demonstrated 6000 colonies of yeast, which does not appear significant. Rocephin was discontinued on 06/03/12.  8. H/o CVA : On Aspirin. Pt has residual left hemiparesis. PT/OT evaluation was carried out, and SNF is recommended.   9.  RA: On chronic steroids therapy. She was placed on stress doses of steroids, to cover her acute illness.  10. History of CAD: Stable/asymptomic.    Code Status: Full Code.  Family Communication:  Disposition Plan: To be determined.    Brief narrative: 78 y.o. female with history of HTN, CAD, s/p NSTEMI, s/p CVA, previous TIA, depression/Anxiety, GERD, previous GI bleed, hiatal hernia, PUD, OA, osteoporosis, hypothyroidism, RA on chronic steroid therapy, Hypothyroidism, PUD, DVT on Xarelto, presenting with fever, nausea, vomiting, cold and congestion. Pt denies cough, chest pain. Per son, she had loose stools since 05/31/12. On arrival to ED , she was found to have a temp of 102, in acute renal failure, with abnormal urine. She was admitted for further management.    Consultants:  N/A  Procedures:  CXR.  Antibiotics: HPI/Subjective: Asymptomatic.  Objective: Vital signs in last 24 hours: Temp:  [97.4 F (36.3 C)-98.5 F (36.9 C)] 98.5 F (36.9 C) (12/05 1333) Pulse Rate:  [81-96] 96  (12/05 1333) Resp:  [18] 18  (12/05 1333) BP: (128-142)/(56-67) 142/62 mmHg (12/05 1333) SpO2:  [97 %-99 %] 97 % (12/05 1333) Weight change:  Last BM Date: 06/02/12  Intake/Output from previous day: 12/04 0701 - 12/05 0700 In: 1150.3 [P.O.:360; IV Piggyback:790.3] Out: -  Total I/O In: 120 [P.O.:120] Out: -    Physical Exam: General: Comfortable,  alert, communicative, not short of breath at rest.  HEENT:  Moderate clinical pallor, no jaundice, no conjunctival injection or discharge. NECK:  Supple, JVP not  seen, no carotid bruits, no palpable lymphadenopathy, no palpable goiter. CHEST:  Clinically clear to auscultation, no wheezes, no crackles. HEART:  Sounds 1 and 2 heard, normal, regular, no murmurs. ABDOMEN:  Full, soft, non-tender, no palpable organomegaly, no palpable masses, normal bowel sounds. GENITALIA:  Not examined. LOWER EXTREMITIES:  No pitting edema, palpable peripheral pulses. MUSCULOSKELETAL SYSTEM:  Generalized osteoarthritic changes, otherwise, normal. CENTRAL NERVOUS SYSTEM:  Residual left hemiparesis.   Lab Results:  Basename 06/03/12 0408 06/02/12 0355  WBC 10.0 15.1*  HGB 7.3* 8.3*  HCT 23.0* 26.9*  PLT 140* 167    Basename 06/03/12 0408 06/02/12 0355  NA 141 140  K 4.2 4.3  CL 109 106  CO2 25 25  GLUCOSE 110* 129*  BUN 24* 26*  CREATININE 1.17* 1.39*  CALCIUM 8.2* 8.3*   Recent Results (from the past 240 hour(s))  CULTURE, BLOOD (ROUTINE X 2)     Status: Normal (Preliminary result)   Collection Time   06/01/12 12:45 PM      Component Value Range Status Comment   Specimen Description BLOOD LEFT ANTECUBITAL   Final    Special Requests BOTTLES DRAWN AEROBIC AND ANAEROBIC   Final    Culture  Setup Time 06/01/2012 23:05   Final    Culture     Final    Value:        BLOOD CULTURE RECEIVED NO GROWTH TO DATE CULTURE WILL BE HELD FOR 5 DAYS BEFORE ISSUING A FINAL NEGATIVE REPORT   Report Status PENDING   Incomplete   CULTURE, BLOOD (ROUTINE X 2)     Status: Normal (Preliminary result)   Collection Time   06/01/12 12:50 PM      Component Value Range Status Comment   Specimen Description BLOOD LEFT WRIST   Final    Special Requests BOTTLES DRAWN AEROBIC ONLY   Final    Culture  Setup Time 06/01/2012 23:06   Final    Culture     Final    Value:        BLOOD CULTURE RECEIVED NO GROWTH TO DATE CULTURE WILL BE HELD FOR 5 DAYS BEFORE ISSUING A FINAL NEGATIVE REPORT   Report Status PENDING   Incomplete   URINE CULTURE     Status: Normal   Collection Time    06/01/12  4:00 PM      Component Value Range Status Comment   Specimen Description URINE, CATHETERIZED   Final    Special Requests NONE   Final    Culture  Setup Time 06/02/2012 00:19   Final    Colony Count 6,000 COLONIES/ML   Final    Culture YEAST   Final    Report Status 06/03/2012 FINAL   Final   CLOSTRIDIUM DIFFICILE BY PCR     Status: Abnormal   Collection Time   06/02/12  3:11 AM      Component Value Range Status Comment   C difficile by pcr POSITIVE (*) NEGATIVE Final   MRSA PCR SCREENING     Status: Normal   Collection Time   06/02/12  7:57 AM      Component Value Range Status Comment   MRSA by PCR NEGATIVE  NEGATIVE Final      Studies/Results: No results found.  Medications: Scheduled Meds:    . antiseptic oral rinse  15 mL Mouth Rinse q12n4p  . aspirin  81 mg Oral Daily  . atorvastatin  40 mg Oral q1800  . [COMPLETED] cefTRIAXone (ROCEPHIN) IVPB 1 gram/50 mL D5W  1 g Intravenous Q24H  . chlorhexidine  15 mL Mouth Rinse BID  . diazepam  5 mg Oral QHS  . folic acid  1 mg Oral Daily  . furosemide  20 mg Intravenous Once  . hydrocortisone sod succinate (SOLU-CORTEF) injection  50 mg Intravenous Q12H  . levothyroxine  50 mcg Oral QAC breakfast  . metroNIDAZOLE  500 mg Oral Q8H  . potassium chloride  30 mEq Oral Daily  . rivaroxaban  20 mg Oral Q supper  . sertraline  50 mg Oral Daily   Continuous Infusions:    . [DISCONTINUED] sodium chloride 75 mL/hr at 06/02/12 0334   PRN Meds:.acetaminophen, HYDROcodone-acetaminophen, ondansetron (ZOFRAN) IV, ondansetron, promethazine    LOS: 2 days   Malonie Tatum,CHRISTOPHER  [facility] Pager [phone]. If 8PM-8AM, please contact night-coverage at [email or web address], password [facility] 06/03/2012, 6:13 PM  LOS: 2 days

## 2012-06-03 NOTE — Progress Notes (Signed)
Physical Therapy Treatment Patient Details Name: [name] MRN: [id number] DOB: Jan 11, 1930 Today's Date: 06/03/2012 Time: 4098-1191 PT Time Calculation (min): 19 min  PT Assessment / Plan / Recommendation Comments on Treatment Session  Pt improved today in mobility.  Recommend continued PT at SNF to decrease burden of care    Follow Up Recommendations  SNF     Does the patient have the potential to tolerate intense rehabilitation     Barriers to Discharge        Equipment Recommendations  None recommended by PT    Recommendations for Other Services OT consult  Frequency Min 3X/week   Plan      Precautions / Restrictions Precautions Precautions: Fall Precaution Comments: Left sided residual hemiparesis Restrictions Weight Bearing Restrictions: No   Pertinent Vitals/Pain No c/o pain    Mobility  Bed Mobility Bed Mobility: Sit to Supine Sit to Supine: 4: Min assist;HOB elevated Details for Bed Mobility Assistance: pt able to pull upper body up using left arm and assist with scooting around to sit on EOB Transfers Transfers: Sit to Stand;Stand to Sit Sit to Stand: 3: Mod assist Stand to Sit: 4: Min assist Details for Transfer Assistance: pt needs cues to push up with arms and has decreased ability to achieve full trunk extension in standing Ambulation/Gait Ambulation/Gait Assistance: 3: Mod assist Ambulation Distance (Feet): 150 Feet (75x2) Assistive device: Rolling walker Ambulation/Gait Assistance Details: frequent cues to extend trunk by bringing chest up, bringing hips uderneath her with encouragement to step up into walker Gait Pattern: Decreased step length - right;Decreased step length - left;Step-to pattern;Trunk flexed Gait velocity: decreased General Gait Details: pt stays bent over RW and had difficulty with weight shift and stepping. Better effort with trunk extension and increased endurance for ambulation today Stairs: No Wheelchair  Mobility Wheelchair Mobility: No    Exercises     PT Diagnosis:    PT Problem List:   PT Treatment Interventions:     PT Goals Acute Rehab PT Goals PT Goal Formulation: With patient Time For Goal Achievement: 06/16/12 Potential to Achieve Goals: Good Pt will go Supine/Side to Sit: with modified independence;with rail PT Goal: Supine/Side to Sit - Progress: Progressing toward goal Pt will go Sit to Supine/Side: with modified independence;with rail Pt will go Sit to Stand: with modified independence;with upper extremity assist PT Goal: Sit to Stand - Progress: Progressing toward goal Pt will go Stand to Sit: with modified independence;with upper extremity assist PT Goal: Stand to Sit - Progress: Progressing toward goal Pt will Stand: with modified independence;3 - 5 min;with unilateral upper extremity support Pt will Ambulate: 16 - 50 feet;with supervision;with least restrictive assistive device PT Goal: Ambulate - Progress: Progressing toward goal  Visit Information  Last PT Received On: 06/03/12 Assistance Needed: +2    Subjective Data  Subjective: "Can I have some cereal? "  pt still on a clear liquid diet Patient Stated Goal: to eat solid food   Cognition  Overall Cognitive Status: Appears within functional limits for tasks assessed/performed Arousal/Alertness: Awake/alert Orientation Level: Appears intact for tasks assessed Behavior During Session: [facility] for tasks performed    Balance  Balance Balance Assessed: Yes Static Sitting Balance Static Sitting - Level of Assistance: 5: Stand by assistance Static Sitting - Comment/# of Minutes: pt sitting up on EOB to take her pills.  Needed cues to keep weight shifted forward to put weight on feet, shift pelvis anteriorly to extend chest   End of Session PT -  End of Session Equipment Utilized During Treatment: Gait belt Activity Tolerance: Patient tolerated treatment well Patient left: with call bell/phone within reach;in  chair Nurse Communication: Mobility status   GP     Bayard Hugger. Manson Passey, [place] [phone] 06/03/2012, 10:56 AM

## 2012-06-04 LAB — BASIC METABOLIC PANEL
CO2: 25 mEq/L (ref 19–32)
Glucose, Bld: 91 mg/dL (ref 70–99)
Potassium: 3.5 mEq/L (ref 3.5–5.1)
Sodium: 139 mEq/L (ref 135–145)

## 2012-06-04 LAB — CBC
Hemoglobin: 8.7 g/dL — ABNORMAL LOW (ref 12.0–15.0)
MCH: 27 pg (ref 26.0–34.0)
RBC: 3.22 MIL/uL — ABNORMAL LOW (ref 3.87–5.11)

## 2012-06-04 LAB — TYPE AND SCREEN
Antibody Screen: NEGATIVE
Unit division: 0

## 2012-06-04 MED ORDER — SACCHAROMYCES BOULARDII 250 MG PO CAPS: 250.0000 mg | ORAL_CAPSULE | Freq: Two times a day (BID) | ORAL | Status: DC

## 2012-06-04 MED ORDER — HYDROCODONE-ACETAMINOPHEN 5-500 MG PO TABS: 1.0000 | ORAL_TABLET | Freq: Four times a day (QID) | ORAL | Status: DC | PRN

## 2012-06-04 MED ORDER — METRONIDAZOLE 500 MG PO TABS: 500.0000 mg | ORAL_TABLET | Freq: Three times a day (TID) | ORAL | Status: AC

## 2012-06-04 MED ORDER — PREDNISONE 20 MG PO TABS: 20.0000 mg | ORAL_TABLET | Freq: Every day | ORAL | Status: AC

## 2012-06-04 MED ORDER — DIAZEPAM 5 MG PO TABS: 5.0000 mg | ORAL_TABLET | Freq: Every day | ORAL | Status: DC

## 2012-06-04 NOTE — Progress Notes (Signed)
Clinical Social Worker facilitated pt discharge needs to [employer] including contacting facility, faxing pt discharge information, providing contact number to RN to call report, and discussing with pt at bedside. Pt did not want ambulance transport called at this time as pt wanted to eat her lunch. Clinical Social Worker discussed with RN who is agreeable to contacted [facility] [phone]) when pt finishes lunch. No further social work needs identified at this time. Clinical Social Worker signing off   Jacklynn Lewis, MSW, [employer]  Clinical Social Work [phone]

## 2012-06-04 NOTE — Progress Notes (Signed)
Called report to Malvin Johns, Shanda Bumps, RN. Pt to d/c today

## 2012-06-04 NOTE — Progress Notes (Signed)
PT Cancellation Note  Patient Details Name: SANTOS HARDWICK MRN: [id number] DOB: 1929-09-27   Cancelled Treatment: Pt politely refusing PT at this time. Will follow up later today as time allows vs another day.   Sallyanne Kuster 06/04/2012, 12:47 PM  Sallyanne Kuster, PTA Office- [phone]

## 2012-06-07 DIAGNOSIS — I10 Essential (primary) hypertension: Secondary | ICD-10-CM | POA: Diagnosis not present

## 2012-06-07 DIAGNOSIS — I6990 Unspecified sequelae of unspecified cerebrovascular disease: Secondary | ICD-10-CM | POA: Diagnosis not present

## 2012-06-07 DIAGNOSIS — F329 Major depressive disorder, single episode, unspecified: Secondary | ICD-10-CM | POA: Diagnosis not present

## 2012-06-07 DIAGNOSIS — M069 Rheumatoid arthritis, unspecified: Secondary | ICD-10-CM | POA: Diagnosis not present

## 2012-06-07 DIAGNOSIS — A0472 Enterocolitis due to Clostridium difficile, not specified as recurrent: Secondary | ICD-10-CM | POA: Diagnosis not present

## 2012-06-07 LAB — CULTURE, BLOOD (ROUTINE X 2)
Culture: NO GROWTH
Culture: NO GROWTH

## 2012-06-18 DIAGNOSIS — Z79899 Other long term (current) drug therapy: Secondary | ICD-10-CM | POA: Diagnosis not present

## 2012-06-24 DIAGNOSIS — R6889 Other general symptoms and signs: Secondary | ICD-10-CM | POA: Diagnosis not present

## 2012-06-25 DIAGNOSIS — N390 Urinary tract infection, site not specified: Secondary | ICD-10-CM | POA: Diagnosis not present

## 2012-07-01 DIAGNOSIS — D649 Anemia, unspecified: Secondary | ICD-10-CM | POA: Diagnosis not present

## 2012-07-01 DIAGNOSIS — E785 Hyperlipidemia, unspecified: Secondary | ICD-10-CM | POA: Diagnosis not present

## 2012-07-01 DIAGNOSIS — R279 Unspecified lack of coordination: Secondary | ICD-10-CM | POA: Diagnosis not present

## 2012-07-01 DIAGNOSIS — Z0000 Encounter for general adult medical examination without abnormal findings: Secondary | ICD-10-CM | POA: Diagnosis not present

## 2012-07-01 DIAGNOSIS — M6281 Muscle weakness (generalized): Secondary | ICD-10-CM | POA: Diagnosis not present

## 2012-07-01 DIAGNOSIS — R269 Unspecified abnormalities of gait and mobility: Secondary | ICD-10-CM | POA: Diagnosis not present

## 2012-07-02 DIAGNOSIS — N390 Urinary tract infection, site not specified: Secondary | ICD-10-CM | POA: Diagnosis not present

## 2012-07-02 DIAGNOSIS — R279 Unspecified lack of coordination: Secondary | ICD-10-CM | POA: Diagnosis not present

## 2012-07-02 DIAGNOSIS — M6281 Muscle weakness (generalized): Secondary | ICD-10-CM | POA: Diagnosis not present

## 2012-07-02 DIAGNOSIS — R269 Unspecified abnormalities of gait and mobility: Secondary | ICD-10-CM | POA: Diagnosis not present

## 2012-07-05 DIAGNOSIS — R279 Unspecified lack of coordination: Secondary | ICD-10-CM | POA: Diagnosis not present

## 2012-07-05 DIAGNOSIS — M6281 Muscle weakness (generalized): Secondary | ICD-10-CM | POA: Diagnosis not present

## 2012-07-05 DIAGNOSIS — R269 Unspecified abnormalities of gait and mobility: Secondary | ICD-10-CM | POA: Diagnosis not present

## 2012-07-06 ENCOUNTER — Emergency Department (HOSPITAL_COMMUNITY): Payer: Medicare Other

## 2012-07-06 ENCOUNTER — Encounter (HOSPITAL_COMMUNITY): Payer: Self-pay | Admitting: Emergency Medicine

## 2012-07-06 ENCOUNTER — Inpatient Hospital Stay (HOSPITAL_COMMUNITY)
Admission: EM | Admit: 2012-07-06 | Discharge: 2012-07-08 | DRG: 641 | Disposition: A | Discharge status: Skilled Nursing Facility | Payer: Medicare Other | Attending: Family Medicine | Admitting: Family Medicine

## 2012-07-06 DIAGNOSIS — D72829 Elevated white blood cell count, unspecified: Secondary | ICD-10-CM

## 2012-07-06 DIAGNOSIS — R0602 Shortness of breath: Secondary | ICD-10-CM | POA: Diagnosis not present

## 2012-07-06 DIAGNOSIS — I219 Acute myocardial infarction, unspecified: Secondary | ICD-10-CM

## 2012-07-06 DIAGNOSIS — E875 Hyperkalemia: Secondary | ICD-10-CM | POA: Diagnosis not present

## 2012-07-06 DIAGNOSIS — J189 Pneumonia, unspecified organism: Secondary | ICD-10-CM

## 2012-07-06 DIAGNOSIS — R579 Shock, unspecified: Secondary | ICD-10-CM

## 2012-07-06 DIAGNOSIS — E039 Hypothyroidism, unspecified: Secondary | ICD-10-CM | POA: Diagnosis present

## 2012-07-06 DIAGNOSIS — M1990 Unspecified osteoarthritis, unspecified site: Secondary | ICD-10-CM

## 2012-07-06 DIAGNOSIS — R7989 Other specified abnormal findings of blood chemistry: Secondary | ICD-10-CM

## 2012-07-06 DIAGNOSIS — D649 Anemia, unspecified: Secondary | ICD-10-CM | POA: Diagnosis not present

## 2012-07-06 DIAGNOSIS — E2740 Unspecified adrenocortical insufficiency: Secondary | ICD-10-CM | POA: Diagnosis present

## 2012-07-06 DIAGNOSIS — R509 Fever, unspecified: Secondary | ICD-10-CM

## 2012-07-06 DIAGNOSIS — M549 Dorsalgia, unspecified: Secondary | ICD-10-CM | POA: Diagnosis present

## 2012-07-06 DIAGNOSIS — R630 Anorexia: Secondary | ICD-10-CM

## 2012-07-06 DIAGNOSIS — R2681 Unsteadiness on feet: Secondary | ICD-10-CM

## 2012-07-06 DIAGNOSIS — F329 Major depressive disorder, single episode, unspecified: Secondary | ICD-10-CM

## 2012-07-06 DIAGNOSIS — N390 Urinary tract infection, site not specified: Secondary | ICD-10-CM | POA: Diagnosis present

## 2012-07-06 DIAGNOSIS — Z7901 Long term (current) use of anticoagulants: Secondary | ICD-10-CM

## 2012-07-06 DIAGNOSIS — M069 Rheumatoid arthritis, unspecified: Secondary | ICD-10-CM | POA: Diagnosis present

## 2012-07-06 DIAGNOSIS — R1310 Dysphagia, unspecified: Secondary | ICD-10-CM

## 2012-07-06 DIAGNOSIS — I639 Cerebral infarction, unspecified: Secondary | ICD-10-CM

## 2012-07-06 DIAGNOSIS — E440 Moderate protein-calorie malnutrition: Secondary | ICD-10-CM

## 2012-07-06 DIAGNOSIS — A0472 Enterocolitis due to Clostridium difficile, not specified as recurrent: Secondary | ICD-10-CM

## 2012-07-06 DIAGNOSIS — E2749 Other adrenocortical insufficiency: Secondary | ICD-10-CM | POA: Diagnosis not present

## 2012-07-06 DIAGNOSIS — R52 Pain, unspecified: Secondary | ICD-10-CM | POA: Diagnosis not present

## 2012-07-06 DIAGNOSIS — A419 Sepsis, unspecified organism: Secondary | ICD-10-CM

## 2012-07-06 DIAGNOSIS — F419 Anxiety disorder, unspecified: Secondary | ICD-10-CM

## 2012-07-06 DIAGNOSIS — K449 Diaphragmatic hernia without obstruction or gangrene: Secondary | ICD-10-CM

## 2012-07-06 DIAGNOSIS — K922 Gastrointestinal hemorrhage, unspecified: Secondary | ICD-10-CM

## 2012-07-06 DIAGNOSIS — M5137 Other intervertebral disc degeneration, lumbosacral region: Secondary | ICD-10-CM | POA: Diagnosis not present

## 2012-07-06 DIAGNOSIS — G8929 Other chronic pain: Secondary | ICD-10-CM | POA: Diagnosis not present

## 2012-07-06 DIAGNOSIS — K279 Peptic ulcer, site unspecified, unspecified as acute or chronic, without hemorrhage or perforation: Secondary | ICD-10-CM

## 2012-07-06 DIAGNOSIS — I959 Hypotension, unspecified: Secondary | ICD-10-CM

## 2012-07-06 DIAGNOSIS — F32A Depression, unspecified: Secondary | ICD-10-CM

## 2012-07-06 DIAGNOSIS — IMO0002 Reserved for concepts with insufficient information to code with codable children: Secondary | ICD-10-CM

## 2012-07-06 DIAGNOSIS — M545 Low back pain: Secondary | ICD-10-CM | POA: Diagnosis not present

## 2012-07-06 DIAGNOSIS — H9201 Otalgia, right ear: Secondary | ICD-10-CM

## 2012-07-06 DIAGNOSIS — I2510 Atherosclerotic heart disease of native coronary artery without angina pectoris: Secondary | ICD-10-CM

## 2012-07-06 DIAGNOSIS — N289 Disorder of kidney and ureter, unspecified: Secondary | ICD-10-CM

## 2012-07-06 DIAGNOSIS — E860 Dehydration: Secondary | ICD-10-CM

## 2012-07-06 DIAGNOSIS — R5383 Other fatigue: Secondary | ICD-10-CM

## 2012-07-06 DIAGNOSIS — K219 Gastro-esophageal reflux disease without esophagitis: Secondary | ICD-10-CM

## 2012-07-06 DIAGNOSIS — I10 Essential (primary) hypertension: Secondary | ICD-10-CM | POA: Diagnosis present

## 2012-07-06 DIAGNOSIS — K529 Noninfective gastroenteritis and colitis, unspecified: Secondary | ICD-10-CM

## 2012-07-06 LAB — CBC
HCT: 29.8 % — ABNORMAL LOW (ref 36.0–46.0)
Hemoglobin: 9.5 g/dL — ABNORMAL LOW (ref 12.0–15.0)
MCH: 27.2 pg (ref 26.0–34.0)
MCV: 85.4 fL (ref 78.0–100.0)
Platelets: 240 10*3/uL (ref 150–400)
RBC: 3.49 MIL/uL — ABNORMAL LOW (ref 3.87–5.11)

## 2012-07-06 LAB — POCT I-STAT, CHEM 8
BUN: 30 mg/dL — ABNORMAL HIGH (ref 6–23)
Calcium, Ion: 1.27 mmol/L (ref 1.13–1.30)
Chloride: 106 mEq/L (ref 96–112)
Glucose, Bld: 120 mg/dL — ABNORMAL HIGH (ref 70–99)
HCT: 29 % — ABNORMAL LOW (ref 36.0–46.0)
Potassium: 6.2 mEq/L — ABNORMAL HIGH (ref 3.5–5.1)

## 2012-07-06 LAB — URINALYSIS, ROUTINE W REFLEX MICROSCOPIC
Glucose, UA: NEGATIVE mg/dL
Hgb urine dipstick: NEGATIVE
Ketones, ur: NEGATIVE mg/dL
Protein, ur: NEGATIVE mg/dL
pH: 6 (ref 5.0–8.0)

## 2012-07-06 LAB — BASIC METABOLIC PANEL
BUN: 29 mg/dL — ABNORMAL HIGH (ref 6–23)
CO2: 28 mEq/L (ref 19–32)
Calcium: 9.7 mg/dL (ref 8.4–10.5)
Chloride: 101 mEq/L (ref 96–112)
Creatinine, Ser: 1.15 mg/dL — ABNORMAL HIGH (ref 0.50–1.10)
Glucose, Bld: 188 mg/dL — ABNORMAL HIGH (ref 70–99)

## 2012-07-06 LAB — URINE MICROSCOPIC-ADD ON

## 2012-07-06 LAB — POTASSIUM: Potassium: 6.7 mEq/L (ref 3.5–5.1)

## 2012-07-06 MED ORDER — LEVOTHYROXINE SODIUM 50 MCG PO TABS
50.0000 ug | ORAL_TABLET | Freq: Every day | ORAL | Status: DC
  Administered 2012-07-07 – 2012-07-08 (×2): 50 ug via ORAL
  Filled 2012-07-06 (×3): qty 1

## 2012-07-06 MED ORDER — ACETAMINOPHEN 650 MG RE SUPP
650.0000 mg | RECTAL | Status: DC | PRN
  Filled 2012-07-06: qty 1

## 2012-07-06 MED ORDER — PREDNISONE 10 MG PO TABS
10.0000 mg | ORAL_TABLET | Freq: Every day | ORAL | Status: DC
  Filled 2012-07-06: qty 1

## 2012-07-06 MED ORDER — HYDROCORTISONE SOD SUCCINATE 100 MG IJ SOLR
50.0000 mg | Freq: Three times a day (TID) | INTRAMUSCULAR | Status: DC
  Administered 2012-07-07 (×2): 50 mg via INTRAVENOUS
  Filled 2012-07-06 (×3): qty 1

## 2012-07-06 MED ORDER — HYDROCODONE-ACETAMINOPHEN 5-325 MG PO TABS
1.0000 | ORAL_TABLET | Freq: Once | ORAL | Status: AC
  Administered 2012-07-06: 1 via ORAL
  Filled 2012-07-06: qty 1

## 2012-07-06 MED ORDER — ATORVASTATIN CALCIUM 40 MG PO TABS
40.0000 mg | ORAL_TABLET | Freq: Every day | ORAL | Status: DC
  Filled 2012-07-06 (×2): qty 1

## 2012-07-06 MED ORDER — METOPROLOL TARTRATE 50 MG PO TABS
50.0000 mg | ORAL_TABLET | Freq: Two times a day (BID) | ORAL | Status: DC
  Administered 2012-07-06 – 2012-07-08 (×4): 50 mg via ORAL
  Filled 2012-07-06 (×3): qty 1
  Filled 2012-07-06: qty 2
  Filled 2012-07-06: qty 1

## 2012-07-06 MED ORDER — INSULIN REGULAR HUMAN 100 UNIT/ML IJ SOLN
10.0000 [IU] | Freq: Once | INTRAMUSCULAR | Status: DC
  Filled 2012-07-06: qty 0.1

## 2012-07-06 MED ORDER — HYDROMORPHONE HCL PF 1 MG/ML IJ SOLN: 0.5000 mg | INTRAMUSCULAR | Status: DC | PRN

## 2012-07-06 MED ORDER — PANTOPRAZOLE SODIUM 40 MG PO TBEC
40.0000 mg | DELAYED_RELEASE_TABLET | Freq: Every day | ORAL | Status: DC
  Administered 2012-07-07 – 2012-07-08 (×2): 40 mg via ORAL
  Filled 2012-07-06 (×2): qty 1

## 2012-07-06 MED ORDER — ONDANSETRON HCL 4 MG PO TABS: 4.0000 mg | ORAL_TABLET | Freq: Four times a day (QID) | ORAL | Status: DC | PRN

## 2012-07-06 MED ORDER — SODIUM CHLORIDE 0.9 % IJ SOLN
3.0000 mL | Freq: Two times a day (BID) | INTRAMUSCULAR | Status: DC
  Administered 2012-07-07 (×2): 3 mL via INTRAVENOUS

## 2012-07-06 MED ORDER — OMEGA-3-ACID ETHYL ESTERS 1 G PO CAPS
1.0000 g | ORAL_CAPSULE | Freq: Every day | ORAL | Status: DC
  Administered 2012-07-07 – 2012-07-08 (×2): 1 g via ORAL
  Filled 2012-07-06 (×2): qty 1

## 2012-07-06 MED ORDER — SODIUM POLYSTYRENE SULFONATE 15 GM/60ML PO SUSP
30.0000 g | Freq: Once | ORAL | Status: AC
  Administered 2012-07-06: 30 g via ORAL
  Filled 2012-07-06: qty 120

## 2012-07-06 MED ORDER — SODIUM CHLORIDE 0.9 % IV SOLN: 250.0000 mL | INTRAVENOUS | Status: DC | PRN

## 2012-07-06 MED ORDER — SODIUM CHLORIDE 0.9 % IJ SOLN: 3.0000 mL | INTRAMUSCULAR | Status: DC | PRN

## 2012-07-06 MED ORDER — SERTRALINE HCL 50 MG PO TABS
50.0000 mg | ORAL_TABLET | Freq: Every evening | ORAL | Status: DC
  Administered 2012-07-07: 50 mg via ORAL
  Filled 2012-07-06 (×3): qty 1

## 2012-07-06 MED ORDER — DEXTROSE 5 % IV SOLN
1.0000 g | INTRAVENOUS | Status: DC
  Administered 2012-07-07: 1 g via INTRAVENOUS
  Filled 2012-07-06 (×2): qty 10

## 2012-07-06 MED ORDER — INSULIN ASPART 100 UNIT/ML ~~LOC~~ SOLN
10.0000 [IU] | Freq: Once | SUBCUTANEOUS | Status: AC
  Administered 2012-07-06: 10 [IU] via SUBCUTANEOUS
  Filled 2012-07-06 (×3): qty 1

## 2012-07-06 MED ORDER — ONDANSETRON HCL 4 MG/2ML IJ SOLN: 4.0000 mg | Freq: Four times a day (QID) | INTRAMUSCULAR | Status: DC | PRN

## 2012-07-06 MED ORDER — ADULT MULTIVITAMIN W/MINERALS CH
1.0000 | ORAL_TABLET | Freq: Every day | ORAL | Status: DC
  Administered 2012-07-07 – 2012-07-08 (×2): 1 via ORAL
  Filled 2012-07-06 (×2): qty 1

## 2012-07-06 MED ORDER — PREDNISONE 10 MG PO TABS
10.0000 mg | ORAL_TABLET | Freq: Every day | ORAL | Status: DC
  Administered 2012-07-08: 10 mg via ORAL
  Filled 2012-07-06 (×2): qty 1

## 2012-07-06 MED ORDER — FOLIC ACID 1 MG PO TABS
1.0000 mg | ORAL_TABLET | Freq: Every day | ORAL | Status: DC
  Administered 2012-07-07 – 2012-07-08 (×2): 1 mg via ORAL
  Filled 2012-07-06 (×2): qty 1

## 2012-07-06 MED ORDER — ASPIRIN 81 MG PO CHEW
81.0000 mg | CHEWABLE_TABLET | Freq: Every day | ORAL | Status: DC
  Administered 2012-07-07 – 2012-07-08 (×2): 81 mg via ORAL
  Filled 2012-07-06 (×2): qty 1

## 2012-07-06 MED ORDER — DEXTROSE 5 % IV SOLN
1.0000 g | Freq: Once | INTRAVENOUS | Status: AC
  Administered 2012-07-06: 22:00:00 via INTRAVENOUS
  Filled 2012-07-06: qty 10

## 2012-07-06 MED ORDER — DIAZEPAM 5 MG PO TABS
5.0000 mg | ORAL_TABLET | Freq: Every day | ORAL | Status: DC
  Administered 2012-07-07 (×2): 5 mg via ORAL
  Filled 2012-07-06 (×2): qty 1

## 2012-07-06 MED ORDER — SODIUM CHLORIDE 0.9 % IV SOLN
1.0000 g | Freq: Once | INTRAVENOUS | Status: AC
  Administered 2012-07-07: 1 g via INTRAVENOUS
  Filled 2012-07-06: qty 10

## 2012-07-06 MED ORDER — FERROUS SULFATE 325 (65 FE) MG PO TABS
325.0000 mg | ORAL_TABLET | Freq: Every day | ORAL | Status: DC
  Administered 2012-07-07 – 2012-07-08 (×2): 325 mg via ORAL
  Filled 2012-07-06 (×3): qty 1

## 2012-07-06 MED ORDER — MICONAZOLE NITRATE 2 % EX CREA
1.0000 "application " | TOPICAL_CREAM | Freq: Two times a day (BID) | CUTANEOUS | Status: DC
  Administered 2012-07-07 – 2012-07-08 (×4): 1 via TOPICAL
  Filled 2012-07-06: qty 14

## 2012-07-06 MED ORDER — FENTANYL CITRATE 0.05 MG/ML IJ SOLN
65.0000 ug | Freq: Once | INTRAMUSCULAR | Status: AC
  Administered 2012-07-06: 65 ug via INTRAVENOUS
  Filled 2012-07-06: qty 2

## 2012-07-06 MED ORDER — SODIUM CHLORIDE 0.9 % IJ SOLN: 3.0000 mL | Freq: Two times a day (BID) | INTRAMUSCULAR | Status: DC

## 2012-07-06 MED ORDER — SACCHAROMYCES BOULARDII 250 MG PO CAPS
250.0000 mg | ORAL_CAPSULE | Freq: Two times a day (BID) | ORAL | Status: DC
  Administered 2012-07-07 – 2012-07-08 (×4): 250 mg via ORAL
  Filled 2012-07-06 (×5): qty 1

## 2012-07-06 MED ORDER — DOCUSATE SODIUM 100 MG PO CAPS
100.0000 mg | ORAL_CAPSULE | Freq: Two times a day (BID) | ORAL | Status: DC
  Administered 2012-07-07 – 2012-07-08 (×4): 100 mg via ORAL
  Filled 2012-07-06 (×5): qty 1

## 2012-07-06 MED ORDER — VITAMIN C 500 MG PO TABS
500.0000 mg | ORAL_TABLET | Freq: Every day | ORAL | Status: DC
  Administered 2012-07-07 – 2012-07-08 (×2): 500 mg via ORAL
  Filled 2012-07-06 (×3): qty 1

## 2012-07-06 MED ORDER — HYDROCODONE-ACETAMINOPHEN 5-325 MG PO TABS
1.0000 | ORAL_TABLET | ORAL | Status: DC | PRN
  Administered 2012-07-07 – 2012-07-08 (×3): 1 via ORAL
  Filled 2012-07-06 (×3): qty 1

## 2012-07-06 MED ORDER — DEXTROSE 50 % IV SOLN
1.0000 | Freq: Once | INTRAVENOUS | Status: AC
  Administered 2012-07-07: 50 mL via INTRAVENOUS

## 2012-07-06 MED ORDER — RIVAROXABAN 20 MG PO TABS
20.0000 mg | ORAL_TABLET | Freq: Every evening | ORAL | Status: DC
  Administered 2012-07-07 (×2): 20 mg via ORAL
  Filled 2012-07-06 (×3): qty 1

## 2012-07-06 NOTE — ED Provider Notes (Signed)
History     CSN: [id number]  Arrival date & time 07/06/12  1402   First MD Initiated Contact with Patient 07/06/12 1511      Chief Complaint  Patient presents with  . Back Pain    (Consider location/radiation/quality/duration/timing/severity/associated sxs/prior treatment) HPI Comments: [name] presents for evaluation of atraumatic lower back pain.  She reports a steadily increasing discomfort for the last week.  It has mad it difficult to even get out of bed.  She reports feeling weak and tired but denies fever, cough, SOB, NVD, constipation, dysuria, incontinence, and rashes.  Patient is a 79 y.o. female presenting with back pain. The history is provided by the patient.  Back Pain  This is a recurrent problem. The current episode started more than 1 week ago. The problem occurs constantly. The problem has been gradually worsening. The pain is associated with no known injury. The pain is present in the lumbar spine. The quality of the pain is described as aching. The pain does not radiate. The pain is at a severity of 8/10. The pain is severe. The symptoms are aggravated by bending and certain positions. Stiffness is present all day. Pertinent negatives include no chest pain, no fever, no numbness, no headaches, no abdominal pain, no abdominal swelling, no bowel incontinence, no perianal numbness, no bladder incontinence, no dysuria, no pelvic pain, no leg pain, no paresthesias, no paresis and no tingling. She has tried analgesics and bed rest for the symptoms. The treatment provided no relief.    Past Medical History  Diagnosis Date  . SOB (shortness of breath)   . MI (myocardial infarction)   . Poor appetite   . Arthritis   . Fatigue   . Right ear pain   . Renal insufficiency   . RA (rheumatoid arthritis)   . PUD (peptic ulcer disease)   . GI bleed 2005  . GERD (gastroesophageal reflux disease)   . Hiatal hernia   . Hypothyroidism   . Osteoporosis   . Depression   .  Anxiety   . Coronary artery disease   . CVA (cerebral vascular accident)   . TIA (transient ischemic attack)   . Fall   . DVT of lower extremity (deep venous thrombosis)     Past Surgical History  Procedure Date  . Cardiac catheterization     SHOWED RUPTURE PLAQUE IN THE LAD. THE LAD IS NONOBSTRUCTIVE WITH ONLY 30-40% STENOSIS  . Nstemi 06/2010  . Spinal fusion surgery   . Knee surgery     Family History  Problem Relation Age of Onset  . Kidney disease Mother   . Kidney disease Brother     History  Substance Use Topics  . Smoking status: Never Smoker   . Smokeless tobacco: Never Used  . Alcohol Use: No    OB History    Grav Para Term Preterm Abortions TAB SAB Ect Mult Living                  Review of Systems  Constitutional: Negative for fever.  Cardiovascular: Negative for chest pain.  Gastrointestinal: Negative for abdominal pain and bowel incontinence.  Genitourinary: Negative for bladder incontinence, dysuria and pelvic pain.  Musculoskeletal: Positive for back pain.  Neurological: Negative for tingling, numbness, headaches and paresthesias.  All other systems reviewed and are negative.    Allergies  Codeine; Morphine and related; Percocet; Plavix; and Sulfur  Home Medications   Current Outpatient Rx  Name  Route  Sig  Dispense  Refill  . ACETAMINOPHEN 650 MG RE SUPP   Rectal   Place 650 mg rectally every 4 (four) hours as needed. Pain         . VITAMIN C PO   Oral   Take 500 mg by mouth daily.          . ASPIRIN 81 MG PO CHEW   Oral   Chew 81 mg by mouth daily.         Marland Kitchen DIAZEPAM 5 MG PO TABS   Oral   Take 1 tablet (5 mg total) by mouth at bedtime.   30 tablet   0   . ESOMEPRAZOLE MAGNESIUM 40 MG PO CPDR   Oral   Take 40 mg by mouth daily before breakfast.          . FERROUS SULFATE 325 (65 FE) MG PO TABS   Oral   Take 325 mg by mouth daily with breakfast.         . FOLIC ACID 1 MG PO TABS   Oral   Take 1 mg by mouth  daily.          Marland Kitchen HYDROCODONE-ACETAMINOPHEN 5-500 MG PO TABS   Oral   Take 1 tablet by mouth at bedtime. 1 tablet every 6 hrs as needed for pain         . LEVOTHYROXINE SODIUM 50 MCG PO TABS   Oral   Take 50 mcg by mouth daily.          Marland Kitchen METOPROLOL TARTRATE 50 MG PO TABS   Oral   Take 50 mg by mouth 2 (two) times daily.          Marland Kitchen MICONAZOLE NITRATE 2 % EX CREA   Topical   Apply 1 application topically 2 (two) times daily. Buttocks and Groin until healed         . ONE-DAILY MULTI VITAMINS PO TABS   Oral   Take 1 tablet by mouth daily.           Marland Kitchen FISH OIL 1200 MG PO CAPS   Oral   Take 1,200 mg by mouth daily.         Marland Kitchen PREDNISONE 10 MG PO TABS   Oral   Take 10 mg by mouth daily.         Marland Kitchen PROMETHAZINE HCL 25 MG PO TABS   Oral   Take 25 mg by mouth every 6 (six) hours as needed. nausea         . RIVAROXABAN 10 MG PO TABS   Oral   Take 20 mg by mouth every evening.          Marland Kitchen ROSUVASTATIN CALCIUM 20 MG PO TABS   Oral   Take 20 mg by mouth every evening.          Marland Kitchen SACCHAROMYCES BOULARDII 250 MG PO CAPS   Oral   Take 1 capsule (250 mg total) by mouth 2 (two) times daily.   60 capsule   0   . SERTRALINE HCL 50 MG PO TABS   Oral   Take 50 mg by mouth every evening.            BP 129/63  Pulse 79  Temp 98 F (36.7 C)  Resp 16  SpO2 95%  Physical Exam  Nursing note and vitals reviewed. Constitutional: She is oriented to person, place, and time. She appears well-developed and well-nourished. She is sleeping and cooperative. She is  easily aroused.  Non-toxic appearance. She does not have a sickly appearance. She does not appear ill. No distress.  HENT:  Head: Normocephalic and atraumatic.  Right Ear: External ear normal.  Left Ear: External ear normal.  Nose: Nose normal.  Mouth/Throat: Oropharynx is clear and moist. No oropharyngeal exudate.  Eyes: Conjunctivae normal are normal. Pupils are equal, round, and reactive to light.  Right eye exhibits no discharge. Left eye exhibits no discharge. No scleral icterus.  Neck: Normal range of motion. Neck supple. No JVD present. No tracheal deviation present.  Cardiovascular: Normal rate, regular rhythm, normal heart sounds, intact distal pulses and normal pulses.   No extrasystoles are present. Exam reveals no gallop, no friction rub and no decreased pulses.   Pulmonary/Chest: Effort normal and breath sounds normal. No stridor. No respiratory distress. She has no wheezes. She has no rales. She exhibits no tenderness.  Abdominal: Soft. Bowel sounds are normal. She exhibits no distension and no mass. There is no tenderness. There is no rebound and no guarding.  Musculoskeletal: Normal range of motion. She exhibits edema (trace) and tenderness.       Right hip: Normal.       Left hip: Normal.       Cervical back: Normal.       Thoracic back: Normal.       Lumbar back: She exhibits tenderness and pain. She exhibits normal range of motion, no bony tenderness and no swelling.  Lymphadenopathy:    She has no cervical adenopathy.  Neurological: She is alert, oriented to person, place, and time and easily aroused. She has normal strength. She displays no atrophy and no tremor. A cranial nerve deficit is present. No sensory deficit. She exhibits normal muscle tone. She displays no seizure activity. GCS eye subscore is 4. GCS verbal subscore is 5. GCS motor subscore is 6. She displays no Babinski's sign on the right side. She displays no Babinski's sign on the left side.       Note pain with left-sided leg raise.  She is able to raise both legs to >30 degrees  Skin: Skin is warm and dry. No rash noted. She is not diaphoretic. No erythema.  Psychiatric: She has a normal mood and affect. Her behavior is normal.    ED Course  Procedures (including critical care time)   Labs Reviewed  CBC  BASIC METABOLIC PANEL  URINALYSIS, ROUTINE W REFLEX MICROSCOPIC   No results found.   No  diagnosis found.   Date: 07/06/2012 @ 1829  Rate: 74 bpm  Rhythm: sinus  QRS Axis: normal  Intervals: normal  ST/T Wave abnormalities: nonspecific T wave changes isolated to lead III  Conduction Disutrbances:none  Narrative Interpretation: sinus rhythm with inferior q waves  Old EKG Reviewed: unchanged (inf q waves seen on previous study also)     MDM  Pt presents for evaluation of lower back pain.  She denies trauma but has a hx of chronic back issues.  She appears nontoxic, note stable VS, NAD.  Pt is able to lift both legs but has significant discomfort when raising the left leg and is unable to sit upright secondary to the discomfort.  Will obtain basic labs, a urinalysis, and x-rays of the lumbar spine.  Will administer IV fentanyl and then attempt to transition to PO medication for pain.  1840.  Pt stable, NAD.  Pt has elevated potassium on 2 separate lab draws.  She has no significant renal insufficiency/failure.  She is  not taking potassium supplements or a potassium sparing diuretic.  She has no EKG changes to suggest hyperkalemia.  I believe these abnormal values are likely secondary to hemolysis.  Will repeat the lab again and follow closely. 2125.  Pt is unable to ambulate.  Ordered rocephin secondary to a UTI.  Plan admit for titration of pain medication, treatment of a UTI, and further evaluation of hyperkalemia.  The K+ was again elevated on the 3rd check.  Ordered kayexalate.  Discussed her evaluation with Dr. Conley Rolls (hospitalist).     Tobin Chad, MD 07/06/12 2136

## 2012-07-06 NOTE — ED Notes (Signed)
Critical results given to Dr Lorenso Courier

## 2012-07-06 NOTE — H&P (Signed)
[facility] History and Physical  [name] NWG:[id number] DOB: 1930-06-22    PCP:   Oneal Grout, MD   Chief Complaint: back pain.  HPI: [name] is an 79 y.o. female with hx of chronic low back pain, s/p prior surgery many years ago, Hx of RA on prednisone, secondary adrenal insufficiency, PUD, DVT on Xarelto, HTN, presents to ER with increase back pain, nausea and dysuria.  She was found in the ER to have K of 6.7 but with no concerning EKG changes.  She has normal renal fx.  The K was repeated and confirmed to be elevated.  Her work up also showed a UTI also.  She has significant back pain and hospitalist was asked to admit her for pain control, evaluate and Tx hyperkalemia, and for UTI.  Rewiew of Systems:  Constitutional: Negative for malaise, fever and chills. No significant weight loss or weight gain Eyes: Negative for eye pain, redness and discharge, diplopia, visual changes, or flashes of light. ENMT: Negative for ear pain, hoarseness, nasal congestion, sinus pressure and sore throat. No headaches; tinnitus, drooling, or problem swallowing. Cardiovascular: Negative for chest pain, palpitations, diaphoresis, dyspnea and peripheral edema. ; No orthopnea, PND Respiratory: Negative for cough, hemoptysis, wheezing and stridor. No pleuritic chestpain. Gastrointestinal: Negative for nausea, vomiting, diarrhea, constipation, abdominal pain, melena, blood in stool, hematemesis, jaundice and rectal bleeding.    Genitourinary: Negative for frequency ,incontinence,flank pain and hematuria; Musculoskeletal: Negative for and neck pain. Negative for swelling and trauma.;  Skin: . Negative for pruritus, rash, abrasions, bruising and skin lesion.; ulcerations Neuro: Negative for headache, lightheadedness and neck stiffness. Negative for weakness, altered level of consciousness , altered mental status, extremity weakness, burning feet, involuntary movement, seizure and syncope.    Psych: negative for anxiety, depression, insomnia, tearfulness, panic attacks, hallucinations, paranoia, suicidal or homicidal ideation    Past Medical History  Diagnosis Date  . SOB (shortness of breath)   . MI (myocardial infarction)   . Poor appetite   . Arthritis   . Fatigue   . Right ear pain   . Renal insufficiency   . RA (rheumatoid arthritis)   . PUD (peptic ulcer disease)   . GI bleed 2005  . GERD (gastroesophageal reflux disease)   . Hiatal hernia   . Hypothyroidism   . Osteoporosis   . Depression   . Anxiety   . Coronary artery disease   . CVA (cerebral vascular accident)   . TIA (transient ischemic attack)   . Fall   . DVT of lower extremity (deep venous thrombosis)     Past Surgical History  Procedure Date  . Cardiac catheterization     SHOWED RUPTURE PLAQUE IN THE LAD. THE LAD IS NONOBSTRUCTIVE WITH ONLY 30-40% STENOSIS  . Nstemi 06/2010  . Spinal fusion surgery   . Knee surgery     Medications:  HOME MEDS: Prior to Admission medications   Medication Sig Start Date End Date Taking? Authorizing Provider  acetaminophen (TYLENOL) 650 MG suppository Place 650 mg rectally every 4 (four) hours as needed. Pain   Yes Historical Provider, MD  Ascorbic Acid (VITAMIN C PO) Take 500 mg by mouth daily.    Yes Historical Provider, MD  aspirin 81 MG chewable tablet Chew 81 mg by mouth daily.   Yes Historical Provider, MD  diazepam (VALIUM) 5 MG tablet Take 1 tablet (5 mg total) by mouth at bedtime. 06/04/12  Yes Laveda Norman, MD  esomeprazole (NEXIUM) 40 MG  capsule Take 40 mg by mouth daily before breakfast.    Yes Historical Provider, MD  ferrous sulfate 325 (65 FE) MG tablet Take 325 mg by mouth daily with breakfast.   Yes Historical Provider, MD  folic acid (FOLVITE) 1 MG tablet Take 1 mg by mouth daily.    Yes Historical Provider, MD  HYDROcodone-acetaminophen (VICODIN) 5-500 MG per tablet Take 1 tablet by mouth at bedtime. 1 tablet every 6 hrs as needed for pain  06/04/12  Yes Laveda Norman, MD  levothyroxine (SYNTHROID, LEVOTHROID) 50 MCG tablet Take 50 mcg by mouth daily.    Yes Historical Provider, MD  metoprolol (LOPRESSOR) 50 MG tablet Take 50 mg by mouth 2 (two) times daily.    Yes Historical Provider, MD  miconazole (MICOTIN) 2 % cream Apply 1 application topically 2 (two) times daily. Buttocks and Groin until healed   Yes Historical Provider, MD  Multiple Vitamin (MULTIVITAMIN) tablet Take 1 tablet by mouth daily.     Yes Historical Provider, MD  Omega-3 Fatty Acids (FISH OIL) 1200 MG CAPS Take 1,200 mg by mouth daily.   Yes Historical Provider, MD  predniSONE (DELTASONE) 10 MG tablet Take 10 mg by mouth daily.   Yes Historical Provider, MD  promethazine (PHENERGAN) 25 MG tablet Take 25 mg by mouth every 6 (six) hours as needed. nausea   Yes Historical Provider, MD  rivaroxaban (XARELTO) 10 MG TABS tablet Take 20 mg by mouth every evening.    Yes Historical Provider, MD  rosuvastatin (CRESTOR) 20 MG tablet Take 20 mg by mouth every evening.    Yes Historical Provider, MD  saccharomyces boulardii (FLORASTOR) 250 MG capsule Take 1 capsule (250 mg total) by mouth 2 (two) times daily. 06/04/12  Yes Laveda Norman, MD  sertraline (ZOLOFT) 50 MG tablet Take 50 mg by mouth every evening.    Yes Historical Provider, MD     Allergies:  Allergies  Allergen Reactions  . Codeine     sick  . Morphine And Related Other (See Comments)    unknown  . Percocet (Oxycodone-Acetaminophen)     unknown  . Plavix (Clopidogrel Bisulfate) Other (See Comments)    unknown  . Sulfur Other (See Comments)    unknown    Social History:   reports that she has never smoked. She has never used smokeless tobacco. She reports that she does not drink alcohol or use illicit drugs.  Family History: Family History  Problem Relation Age of Onset  . Kidney disease Mother   . Kidney disease Brother      Physical Exam: Filed Vitals:   07/06/12 1417 07/06/12 1835 07/06/12 1838   BP: 129/63 153/66   Pulse: 79 77   Temp: 98 F (36.7 C)    Resp: 16    SpO2: 95%  94%   Blood pressure 153/66, pulse 77, temperature 98 F (36.7 C), resp. rate 16, SpO2 94.00%.  GEN:  Pleasant patient lying in the stretcher in no acute distress; cooperative with exam. PSYCH:  alert and oriented x4; does not appear anxious or depressed; affect is appropriate. HEENT: Mucous membranes pink and anicteric; PERRLA; EOM intact; no cervical lymphadenopathy nor thyromegaly or carotid bruit; no JVD; There were no stridor. Neck is very supple. Breasts:: Not examined CHEST WALL: No tenderness CHEST: Normal respiration, clear to auscultation bilaterally.  HEART: Regular rate and rhythm.  There are no murmur, rub, or gallops.   BACK: No kyphosis or scoliosis; no CVA tenderness ABDOMEN: soft  and non-tender; no masses, no organomegaly, normal abdominal bowel sounds; no pannus; no intertriginous candida. There is no rebound and no distention. Rectal Exam: Not done EXTREMITIES: No bone or joint deformity; age-appropriate arthropathy of the hands and knees; no edema; no ulcerations.  There is no calf tenderness. Genitalia: not examined PULSES: 2+ and symmetric SKIN: Normal hydration no rash or ulceration CNS: Cranial nerves 2-12 grossly intact no focal lateralizing neurologic deficit.  Speech is fluent; uvula elevated with phonation, facial symmetry and tongue midline. DTR are absent, cerebella exam is intact, barbinski is negative and strengths are equaled bilaterally.  No sensory loss.   Labs on Admission:  Basic Metabolic Panel:  Lab 07/06/12 1610 07/06/12 1730 07/06/12 1600  NA 138 -- 137  K 6.2* 6.7* 5.9*  CL 106 -- 101  CO2 -- -- 28  GLUCOSE 120* -- 188*  BUN 30* -- 29*  CREATININE 1.20* -- 1.15*  CALCIUM -- -- 9.7  MG -- -- --  PHOS -- -- --   Liver Function Tests: No results found for this basename: AST:5,ALT:5,ALKPHOS:5,BILITOT:5,PROT:5,ALBUMIN:5 in the last 168 hours No results  found for this basename: LIPASE:5,AMYLASE:5 in the last 168 hours No results found for this basename: AMMONIA:5 in the last 168 hours CBC:  Lab 07/06/12 1932 07/06/12 1600  WBC -- 12.9*  NEUTROABS -- --  HGB 9.9* 9.5*  HCT 29.0* 29.8*  MCV -- 85.4  PLT -- 240   Cardiac Enzymes: No results found for this basename: CKTOTAL:5,CKMB:5,CKMBINDEX:5,TROPONINI:5 in the last 168 hours  CBG: No results found for this basename: GLUCAP:5 in the last 168 hours   Radiological Exams on Admission: Dg Lumbar Spine Complete  07/06/2012  *RADIOLOGY REPORT*  Clinical Data: Retaining low back pain.  History of chronic low back pain.  LUMBAR SPINE - COMPLETE 4+ VIEW  Comparison: 04/04/2012.03/16/2012 CT abdomen.  Findings: Thoracolumbar posterior rod and pedicle screw fixation is present extending from the lower thoracic spine to L5. Fractured fixation rod superior to the right L4 pedicle screw appears similar.  Osteopenia.  Chronic T12 compression fracture with about 50% loss of vertebral body height.  There is also a chronic T10 compression fracture, previously demonstrated by CT. Severe L5-S1 degenerative disc disease appears similar to prior exam with vacuum disc.   Atherosclerosis.  Sacral arcades appear intact.  Dysmorphic appearance of the iliac bone representing bone graft harvesting site.  IMPRESSION: 1.  No interval change.  Fractured right fixation rods superior to the bright L4 pedicle screw. 2. Unchanged loosening of both L5 pedicle screws.   Original Report Authenticated By: Andreas Newport, M.D.     EKG: Independently reviewed. No peaked Ts, no widen QRS.  No acute ischemic changes.   Assessment/Plan Present on Admission:  . RA (rheumatoid arthritis) . Hypothyroidism . Adrenal insufficiency . Hypertension . UTI (lower urinary tract infection) . Hyperkalemia . Back pain, acute  PLAN:  Hyperkalemia of unclear etiology.  She was on ACE-I and K supplement before, but not now.  She has hx of  adrenal insufficiency, but her hemodynamic stability is arguing against it.  In any event, I have started full tx including D50, Insulin, Calcium, and Kayexalate.  For her adrenal insufficiency, will continue her current Pred dose, and add fast acting solucortef IV.  Her back pain could be from pyelo, or musculoskeletal pain, but her lower extremity strengths are still strong.  Will treat her UTI with Rocephin.  I have continued her anticoagulation with Xarelto for her hx of DVT.  She is stable, full code, and will be admitted to telemetry under [facility] service.  Thank you for allowing me to partake in the care of your nice patient.    Other plans as per orders.  Code Status: FULL Unk Lightning, MD. [facility] Pager [phone] 7pm to 7am.  07/06/2012, 10:36 PM

## 2012-07-06 NOTE — ED Notes (Signed)
Daughter [name] phone number [phone] and [phone] (home).  Pt state it is fine to speak with daughters about care.

## 2012-07-06 NOTE — Progress Notes (Signed)
CSW met with pt at bedside. CSW confirmed with patient that she is a resident at [facility]. Pt stated that her daughter visits daily along with her other children. Please consult CSW for further needs. Pt plans to return to [facility] when medically stable.   Catha Gosselin, LCSWA  [phone] .07/06/2012 1821pm

## 2012-07-06 NOTE — ED Notes (Signed)
Note from nursing home states that she has been having increasing weakness and has been unable to stand or get out of bed.  PMH of CVA.  Note also state pt has been having elevated renal function labs with hx of renal insufficiency.  Pt states she has lower back pain that moves around.  Pt able to move limbs.

## 2012-07-06 NOTE — ED Notes (Signed)
ZOX:[id number]<AV> Expected date:07/06/12<BR> Expected time: 1:54 PM<BR> Means of arrival:Ambulance<BR> Comments:<BR> 82yoF/low back pain

## 2012-07-06 NOTE — ED Notes (Signed)
Pt unable to void at this time. 

## 2012-07-06 NOTE — ED Notes (Signed)
Pt from [facility].  Per EMs pt complaining of lower back pain for past week.  Describes as sharp.  Denies injury.  PMH of lower back surgery and fused discs.

## 2012-07-06 NOTE — ED Notes (Signed)
Dr. Conley Rolls notified of pt's BP of 186/90. No additional orders received. Will give scheduled dose of Metoprolol.

## 2012-07-07 DIAGNOSIS — E039 Hypothyroidism, unspecified: Secondary | ICD-10-CM | POA: Diagnosis present

## 2012-07-07 DIAGNOSIS — D649 Anemia, unspecified: Secondary | ICD-10-CM

## 2012-07-07 DIAGNOSIS — IMO0002 Reserved for concepts with insufficient information to code with codable children: Secondary | ICD-10-CM | POA: Diagnosis not present

## 2012-07-07 DIAGNOSIS — G8929 Other chronic pain: Secondary | ICD-10-CM | POA: Diagnosis not present

## 2012-07-07 DIAGNOSIS — M549 Dorsalgia, unspecified: Secondary | ICD-10-CM | POA: Diagnosis present

## 2012-07-07 DIAGNOSIS — M545 Low back pain, unspecified: Secondary | ICD-10-CM | POA: Diagnosis not present

## 2012-07-07 DIAGNOSIS — R52 Pain, unspecified: Secondary | ICD-10-CM | POA: Diagnosis not present

## 2012-07-07 DIAGNOSIS — M069 Rheumatoid arthritis, unspecified: Secondary | ICD-10-CM | POA: Diagnosis present

## 2012-07-07 DIAGNOSIS — I119 Hypertensive heart disease without heart failure: Secondary | ICD-10-CM | POA: Diagnosis not present

## 2012-07-07 DIAGNOSIS — N390 Urinary tract infection, site not specified: Secondary | ICD-10-CM | POA: Diagnosis not present

## 2012-07-07 DIAGNOSIS — E875 Hyperkalemia: Secondary | ICD-10-CM | POA: Diagnosis present

## 2012-07-07 DIAGNOSIS — M129 Arthropathy, unspecified: Secondary | ICD-10-CM

## 2012-07-07 DIAGNOSIS — I10 Essential (primary) hypertension: Secondary | ICD-10-CM | POA: Diagnosis present

## 2012-07-07 DIAGNOSIS — E2749 Other adrenocortical insufficiency: Secondary | ICD-10-CM | POA: Diagnosis not present

## 2012-07-07 LAB — MRSA PCR SCREENING: MRSA by PCR: NEGATIVE

## 2012-07-07 MED ORDER — SODIUM POLYSTYRENE SULFONATE 15 GM/60ML PO SUSP
15.0000 g | Freq: Once | ORAL | Status: AC
  Administered 2012-07-07: 15 g via ORAL
  Filled 2012-07-07: qty 60

## 2012-07-07 NOTE — Progress Notes (Signed)
[facility] PROGRESS NOTE  [name] ZOX:[id number] DOB: [birth year] DOA: 07/06/2012 PCP: Oneal Grout, MD  Assessment/Plan: 1. Hyperkalemia: Will administer kayexalate x 1 time today.  Encourage oral po intake. Recheck level next am. 2. UTI: continue current antibiotic regimen (Rocephin).  Would plan on three total days of treatment. F/u with urine culture which is pending 3. HTN: Continue metoprolol and monitor blood pressure readings closely.  May need to adjust medications pending further values. 4. Hypothyroidism: Stable continue levothyroxine 5. Adrenal insufficiency: At this juncture will plan on discontinuing the solucortef and continuing home regimen.  Will monitor patient response. 6. Chronic back pain: May be worse due to prolonged bed rest. Place order for out of bed to chair with assistance.  Will have physical therapy evaluate patient further.  X ray of lumbar spine showed no interval change. Patient has had extensive problems with her back with prior orthopaedic intervention.  Has chronic t 12 compression fracture with 50% loss of vertebral body height, DJD, and fractured fixation rod superior to the right L4 pedicle screw (which appeared similar).   Code Status: full Family Communication: Spoke with pt and daughter in room Disposition Plan: Pending further improvement in potassium levels.  Likely d/c in 1-2 days.  Would recommend hospital follow up with orthopaedic surgeon.   Consultants:  none  Procedures:  X ray of lumbar spine  Antibiotics:  Rocephin  HPI/Subjective: Patient complaining of chronic back pain.  Otherwise no new complaints.  Is inquiring about discharge.  Daughter had many questions which were answered to her and patient's satisfaction.  Objective: Filed Vitals:   07/06/12 2249 07/06/12 2345 07/07/12 0456 07/07/12 1416  BP: 186/90 171/82 171/88 156/60  Pulse:  80 78 90  Temp:  97.5 F (36.4 C) 98.3 F (36.8 C) 98.1 F (36.7 C)    TempSrc:  Oral Oral Oral  Resp:  16 18 18   Height:  5\' 5"  (1.651 m)    Weight:  69.945 kg (154 lb 3.2 oz)    SpO2:  98% 95% 97%    Intake/Output Summary (Last 24 hours) at 07/07/12 1512 Last data filed at 07/07/12 0900  Gross per 24 hour  Intake    120 ml  Output      0 ml  Net    120 ml   Filed Weights   07/06/12 2345  Weight: 69.945 kg (154 lb 3.2 oz)    Exam:   General:  Pt in NAD, Alert and Awake  Cardiovascular: RRR, No MRG  Respiratory: CTA BL, no wheezes  Abdomen: soft, NT, ND  Musculoskeletal: discomfort with palpation over paraspinal muscles of lower back.  Data Reviewed: Basic Metabolic Panel:  Lab 07/06/12 8119 07/06/12 1730 07/06/12 1600  NA 138 -- 137  K 6.2* 6.7* 5.9*  CL 106 -- 101  CO2 -- -- 28  GLUCOSE 120* -- 188*  BUN 30* -- 29*  CREATININE 1.20* -- 1.15*  CALCIUM -- -- 9.7  MG -- -- --  PHOS -- -- --   Liver Function Tests: No results found for this basename: AST:5,ALT:5,ALKPHOS:5,BILITOT:5,PROT:5,ALBUMIN:5 in the last 168 hours No results found for this basename: LIPASE:5,AMYLASE:5 in the last 168 hours No results found for this basename: AMMONIA:5 in the last 168 hours CBC:  Lab 07/06/12 1932 07/06/12 1600  WBC -- 12.9*  NEUTROABS -- --  HGB 9.9* 9.5*  HCT 29.0* 29.8*  MCV -- 85.4  PLT -- 240   Cardiac Enzymes: No results found for this basename:  CKTOTAL:5,CKMB:5,CKMBINDEX:5,TROPONINI:5 in the last 168 hours BNP (last 3 results)  Basename 03/18/12 0400 03/17/12 0530  PROBNP 3700.0* 1429.0*   CBG: No results found for this basename: GLUCAP:5 in the last 168 hours  Recent Results (from the past 240 hour(s))  MRSA PCR SCREENING     Status: Normal   Collection Time   07/07/12  2:08 AM      Component Value Range Status Comment   MRSA by PCR NEGATIVE  NEGATIVE Final      Studies: Dg Lumbar Spine Complete  07/06/2012  *RADIOLOGY REPORT*  Clinical Data: Retaining low back pain.  History of chronic low back pain.  LUMBAR  SPINE - COMPLETE 4+ VIEW  Comparison: 04/04/2012.03/16/2012 CT abdomen.  Findings: Thoracolumbar posterior rod and pedicle screw fixation is present extending from the lower thoracic spine to L5. Fractured fixation rod superior to the right L4 pedicle screw appears similar.  Osteopenia.  Chronic T12 compression fracture with about 50% loss of vertebral body height.  There is also a chronic T10 compression fracture, previously demonstrated by CT. Severe L5-S1 degenerative disc disease appears similar to prior exam with vacuum disc.   Atherosclerosis.  Sacral arcades appear intact.  Dysmorphic appearance of the iliac bone representing bone graft harvesting site.  IMPRESSION: 1.  No interval change.  Fractured right fixation rods superior to the bright L4 pedicle screw. 2. Unchanged loosening of both L5 pedicle screws.   Original Report Authenticated By: Andreas Newport, M.D.     Scheduled Meds:   . aspirin  81 mg Oral Daily  . atorvastatin  40 mg Oral q1800  . cefTRIAXone (ROCEPHIN)  IV  1 g Intravenous Q24H  . diazepam  5 mg Oral QHS  . docusate sodium  100 mg Oral BID  . ferrous sulfate  325 mg Oral QPC breakfast  . folic acid  1 mg Oral Daily  . hydrocortisone sod succinate (SOLU-CORTEF) injection  50 mg Intravenous Q8H  . levothyroxine  50 mcg Oral QAC breakfast  . metoprolol  50 mg Oral BID  . miconazole  1 application Topical BID  . multivitamin with minerals  1 tablet Oral Daily  . omega-3 acid ethyl esters  1 g Oral Daily  . pantoprazole  40 mg Oral Daily  . predniSONE  10 mg Oral Q breakfast  . rivaroxaban  20 mg Oral QPM  . saccharomyces boulardii  250 mg Oral BID  . sertraline  50 mg Oral QPM  . sodium chloride  3 mL Intravenous Q12H  . sodium chloride  3 mL Intravenous Q12H  . sodium polystyrene  15 g Oral Once  . vitamin C  500 mg Oral Daily   Continuous Infusions:   Active Problems:  RA (rheumatoid arthritis)  Hypothyroidism  Adrenal insufficiency  Hypertension  UTI  (lower urinary tract infection)  Hyperkalemia  Back pain, acute    Time spent: > 35 minutes    Penny Pia  [facility] Pager [phone] If 8PM-8AM, please contact night-coverage at [email or web address], password [facility] 07/07/2012, 3:12 PM  LOS: 1 day

## 2012-07-08 DIAGNOSIS — A0472 Enterocolitis due to Clostridium difficile, not specified as recurrent: Secondary | ICD-10-CM

## 2012-07-08 DIAGNOSIS — E2749 Other adrenocortical insufficiency: Secondary | ICD-10-CM | POA: Diagnosis not present

## 2012-07-08 DIAGNOSIS — N289 Disorder of kidney and ureter, unspecified: Secondary | ICD-10-CM

## 2012-07-08 DIAGNOSIS — R52 Pain, unspecified: Secondary | ICD-10-CM | POA: Diagnosis not present

## 2012-07-08 DIAGNOSIS — R269 Unspecified abnormalities of gait and mobility: Secondary | ICD-10-CM

## 2012-07-08 DIAGNOSIS — F329 Major depressive disorder, single episode, unspecified: Secondary | ICD-10-CM

## 2012-07-08 DIAGNOSIS — M545 Low back pain: Secondary | ICD-10-CM | POA: Diagnosis not present

## 2012-07-08 DIAGNOSIS — G8929 Other chronic pain: Secondary | ICD-10-CM | POA: Diagnosis not present

## 2012-07-08 DIAGNOSIS — I119 Hypertensive heart disease without heart failure: Secondary | ICD-10-CM | POA: Diagnosis not present

## 2012-07-08 DIAGNOSIS — M069 Rheumatoid arthritis, unspecified: Secondary | ICD-10-CM

## 2012-07-08 DIAGNOSIS — N390 Urinary tract infection, site not specified: Secondary | ICD-10-CM | POA: Diagnosis not present

## 2012-07-08 LAB — BASIC METABOLIC PANEL
BUN: 25 mg/dL — ABNORMAL HIGH (ref 6–23)
Chloride: 101 mEq/L (ref 96–112)
GFR calc non Af Amer: 46 mL/min — ABNORMAL LOW (ref >90)
Glucose, Bld: 96 mg/dL (ref 70–99)
Potassium: 3.4 mEq/L — ABNORMAL LOW (ref 3.5–5.1)

## 2012-07-08 LAB — URINE CULTURE: Colony Count: >=100000

## 2012-07-08 LAB — CBC
HCT: 30.3 % — ABNORMAL LOW (ref 36.0–46.0)
Hemoglobin: 9.5 g/dL — ABNORMAL LOW (ref 12.0–15.0)
MCHC: 31.4 g/dL (ref 30.0–36.0)

## 2012-07-08 MED ORDER — RIVAROXABAN 15 MG PO TABS
15.0000 mg | ORAL_TABLET | Freq: Every evening | ORAL | Status: DC
  Filled 2012-07-08: qty 1

## 2012-07-08 MED ORDER — AMLODIPINE BESYLATE 10 MG PO TABS
10.0000 mg | ORAL_TABLET | Freq: Every day | ORAL | Status: DC
  Administered 2012-07-08: 10 mg via ORAL
  Filled 2012-07-08: qty 1

## 2012-07-08 MED ORDER — AMLODIPINE BESYLATE 10 MG PO TABS: 10.0000 mg | ORAL_TABLET | Freq: Every day | ORAL | Status: DC

## 2012-07-08 MED ORDER — CEPHALEXIN 500 MG PO CAPS: 500.0000 mg | ORAL_CAPSULE | Freq: Two times a day (BID) | ORAL | Status: DC

## 2012-07-08 NOTE — Evaluation (Signed)
Physical Therapy Evaluation Patient Details Name: [name] MRN: [id number] DOB: 1929/11/22 Today's Date: 07/08/2012 Time: 4098-1191 PT Time Calculation (min): 19 min  PT Assessment / Plan / Recommendation Clinical Impression  [name] presents for evaluation of atraumatic lower back pain.  She reports a steadily increasing discomfort for the last week.  It has made it difficult to even get out of bed.  She reports feeling weak and tired but denies fever, etc. Pt reports pain was limiting  therapy at [facility] prior to adm;  Pt requires max to total assist for basic mobility at time of eval; She will need to return to SNF level of care at D/C    PT Assessment  Patient needs continued PT services    Follow Up Recommendations  SNF    Does the patient have the potential to tolerate intense rehabilitation      Barriers to Discharge        Equipment Recommendations  None recommended by PT    Recommendations for Other Services     Frequency Min 3X/week    Precautions / Restrictions Precautions Precautions: Back   Pertinent Vitals/Pain       Mobility  Bed Mobility Bed Mobility: Rolling Left;Left Sidelying to Sit;Sitting - Scoot to [place] of Bed;Sit to Sidelying Left Rolling Left: 2: Max assist;With rail Left Sidelying to Sit: 2: Max assist;HOB flat Sitting - Scoot to [place] of Bed:  (scooting along EOB with max assist) Sit to Sidelying Left: 1: +1 Total assist;HOB flat Details for Bed Mobility Assistance: pt requires assist with trunk and LEs throughout; diminished pt effort due to back pain for which pt was premedicated; cues for self assist, log roll Transfers Transfers: Not assessed Details for Transfer Assistance: due to pain and no +2 assist available;     Shoulder Instructions     Exercises     PT Diagnosis: Generalized weakness  PT Problem List: Decreased strength;Decreased activity tolerance;Decreased balance;Decreased mobility;Decreased knowledge of use of  DME PT Treatment Interventions: Functional mobility training;Therapeutic activities;Therapeutic exercise;Patient/family education;DME instruction;Gait training   PT Goals Acute Rehab PT Goals PT Goal Formulation: With patient Time For Goal Achievement: 07/22/12 Potential to Achieve Goals: Fair Pt will Roll Supine to Left Side: with mod assist PT Goal: Rolling Supine to Left Side - Progress: Goal set today Pt will go Supine/Side to Sit: with mod assist PT Goal: Supine/Side to Sit - Progress: Goal set today Pt will go Sit to Supine/Side: with mod assist PT Goal: Sit to Supine/Side - Progress: Goal set today Pt will go Sit to Stand: with mod assist PT Goal: Sit to Stand - Progress: Goal set today Pt will Transfer Bed to Chair/Chair to Bed: with mod assist PT Transfer Goal: Bed to Chair/Chair to Bed - Progress: Goal set today Pt will Ambulate: 1 - 15 feet;with mod assist PT Goal: Ambulate - Progress: Goal set today  Visit Information  Last PT Received On: 07/08/12    Subjective Data  Subjective: my back is hurting Patient Stated Goal: return to [facility]   Prior Functioning  Home Living Available Help at Discharge: Skilled Nursing Facility (at [facility] x 6 mos) Home Adaptive Equipment: Dan Humphreys - four wheeled Prior Function Level of Independence: Needs assistance Needs Assistance: Gait;Bathing;Dressing;Toileting Gait Assistance: pt reports she was not cleared to be I in room at [facility], still needed assist with amb    Cognition  Overall Cognitive Status: Appears within functional limits for tasks assessed/performed Arousal/Alertness: Awake/alert Orientation Level: Appears intact  for tasks assessed Behavior During Session: [facility] for tasks performed    Extremity/Trunk Assessment Right Upper Extremity Assessment RUE ROM/Strength/Tone: Deficits RUE ROM/Strength/Tone Deficits: grossly 3 to 3+/5; AROM grossly WFL Left Upper Extremity Assessment LUE ROM/Strength/Tone: Deficits LUE  ROM/Strength/Tone Deficits: same as above Right Lower Extremity Assessment RLE ROM/Strength/Tone: Deficits RLE ROM/Strength/Tone Deficits: ankle grossly WFL,  knee and hp 3/5 grossly;  A/AROM grossly WFL Left Lower Extremity Assessment LLE ROM/Strength/Tone: Deficits LLE ROM/Strength/Tone Deficits: ankle WFL; knee and hip 2+/5 grossly; A/AROM grossly [facility]   Balance Static Sitting Balance Static Sitting - Balance Support: Bilateral upper extremity supported;Feet supported Static Sitting - Level of Assistance: 4: Min assist;5: Stand by assistance  End of Session PT - End of Session Activity Tolerance: Patient limited by fatigue;Patient limited by pain Patient left: in bed;with call bell/phone within reach;with bed alarm set;with family/visitor present Nurse Communication: Other (comment) (pt incontinent of stool upon return to bed)  GP     [facility] 07/08/2012, 10:38 AM

## 2012-07-08 NOTE — Discharge Summary (Signed)
Physician Discharge Summary  [name] XBJ:[id number] DOB: [birth year] DOA: 07/06/2012  PCP: Oneal Grout, MD  Admit date: 07/06/2012 Discharge date: 07/08/2012  Time spent: > 35 minutes  Recommendations for Outpatient Follow-up:  1. Please be sure to follow up with your PCP in 1-2 weeks 2. F/u with urine culture sensitivities and adjust antibiotics if needed 3. F/u with blood pressure and adjust medications as needed.  Discharge Diagnoses:  Active Problems:  RA (rheumatoid arthritis)  Hypothyroidism  Adrenal insufficiency  Hypertension  UTI (lower urinary tract infection)  Hyperkalemia  Back pain, acute   Discharge Condition: Stable  Diet recommendation: Dysphagia 3 diet  Filed Weights   07/06/12 2345  Weight: 69.945 kg (154 lb 3.2 oz)    History of present illness:  From original HPI: [name] is an 79 y.o. female with hx of chronic low back pain, s/p prior surgery many years ago, Hx of RA on prednisone, secondary adrenal insufficiency, PUD, DVT on Xarelto, HTN, presents to ER with increase back pain, nausea and dysuria. She was found in the ER to have K of 6.7 but with no concerning EKG changes. She has normal renal fx. The K was repeated and confirmed to be elevated. Her work up also showed a UTI also. She has significant back pain and hospitalist was asked to admit her for pain control, evaluate and Tx hyperkalemia, and for UTI.   Hospital Course:  1. Hyperkalemia: Resolved and was most likely due to ACE I and potassium replacement.  Given that patient's potassium is only mildly hypokalemic at this juncture at 3.4 would plan on patient having her levels repeated as outpatient.   2. UTI: At this point will plan on discharging patient on Keflex.  Have recommended that patient have her urine sensitivities followed up with. Will treat with oral antibiotics for 6 more days on keflex.  F/u with urine culture which is pending.  Also patient will continue florastor to  help against c diff which patient has had in the past.  3. HTN: Continue metoprolol and monitor blood pressure readings closely.  Have added amlodipine for better blood pressure control 4. Hypothyroidism: Stable continue levothyroxine 5. Adrenal insufficiency: Patient was initially placed on solucortef which was discontinued after initial visit.  Continuing home regimen once patient is transitioned home. 6. Chronic back pain: May be worse due to prolonged bed rest. Physical therapy evaluated patient and recommended continued PT services while in SNF. Will have physical therapy evaluate patient further. X ray of lumbar spine showed no interval change. Patient has had extensive problems with her back with prior orthopaedic intervention. Has chronic t 12 compression fracture with 50% loss of vertebral body height, DJD, and fractured fixation rod superior to the right L4 pedicle screw (which appeared similar).  Would recommend patient follow up with orthopeadic surgeon as outpatient and have discussed this with daughter.    Procedures:  DG lumbar spine  Consultations:  none  Discharge Exam: Filed Vitals:   07/07/12 2138 07/08/12 0512 07/08/12 0855 07/08/12 1337  BP: 171/74 188/83 176/75 155/70  Pulse: 84 84 83 92  Temp: 98.1 F (36.7 C) 98 F (36.7 C) 97.7 F (36.5 C) 98.4 F (36.9 C)  TempSrc: Oral Oral Oral Oral  Resp: 20 18  18   Height:      Weight:      SpO2: 97% 97%  95%    General: Pt in NAD, Alert and Oriented Cardiovascular: RRR, No MRG Respiratory: CTA BL no wheezes  Neuro: Patient has equal strength BL at lower extremities, moves all extremities equally.  Discharge Instructions  Discharge Orders    Future Orders Please Complete By Expires   Diet - low sodium heart healthy      Increase activity slowly      Discharge instructions      Comments:   Please be sure to follow up with your primary care physician in 1-2 weeks or sooner should any new concerns arise.   Call  MD for:  temperature >100.4      Call MD for:  severe uncontrolled pain      Call MD for:  extreme fatigue          Medication List     As of 07/08/2012  2:24 PM    TAKE these medications         acetaminophen 650 MG suppository   Commonly known as: TYLENOL   Place 650 mg rectally every 4 (four) hours as needed. Pain      aspirin 81 MG chewable tablet   Chew 81 mg by mouth daily.      cephALEXin 500 MG capsule   Commonly known as: KEFLEX   Take 1 capsule (500 mg total) by mouth 2 (two) times daily.      diazepam 5 MG tablet   Commonly known as: VALIUM   Take 1 tablet (5 mg total) by mouth at bedtime.      esomeprazole 40 MG capsule   Commonly known as: NEXIUM   Take 40 mg by mouth daily before breakfast.      ferrous sulfate 325 (65 FE) MG tablet   Take 325 mg by mouth daily with breakfast.      Fish Oil 1200 MG Caps   Take 1,200 mg by mouth daily.      folic acid 1 MG tablet   Commonly known as: FOLVITE   Take 1 mg by mouth daily.      HYDROcodone-acetaminophen 5-500 MG per tablet   Commonly known as: VICODIN   Take 1 tablet by mouth at bedtime. 1 tablet every 6 hrs as needed for pain      levothyroxine 50 MCG tablet   Commonly known as: SYNTHROID, LEVOTHROID   Take 50 mcg by mouth daily.      metoprolol 50 MG tablet   Commonly known as: LOPRESSOR   Take 50 mg by mouth 2 (two) times daily.      miconazole 2 % cream   Commonly known as: MICOTIN   Apply 1 application topically 2 (two) times daily. Buttocks and Groin until healed      multivitamin tablet   Take 1 tablet by mouth daily.      predniSONE 10 MG tablet   Commonly known as: DELTASONE   Take 10 mg by mouth daily.      promethazine 25 MG tablet   Commonly known as: PHENERGAN   Take 25 mg by mouth every 6 (six) hours as needed. nausea      rivaroxaban 10 MG Tabs tablet   Commonly known as: XARELTO   Take 20 mg by mouth every evening.      rosuvastatin 20 MG tablet   Commonly known as:  CRESTOR   Take 20 mg by mouth every evening.      saccharomyces boulardii 250 MG capsule   Commonly known as: FLORASTOR   Take 1 capsule (250 mg total) by mouth 2 (two) times daily.      sertraline  50 MG tablet   Commonly known as: ZOLOFT   Take 50 mg by mouth every evening.      VITAMIN C PO   Take 500 mg by mouth daily.          The results of significant diagnostics from this hospitalization (including imaging, microbiology, ancillary and laboratory) are listed below for reference.    Significant Diagnostic Studies: Dg Lumbar Spine Complete  07/06/2012  *RADIOLOGY REPORT*  Clinical Data: Retaining low back pain.  History of chronic low back pain.  LUMBAR SPINE - COMPLETE 4+ VIEW  Comparison: 04/04/2012.03/16/2012 CT abdomen.  Findings: Thoracolumbar posterior rod and pedicle screw fixation is present extending from the lower thoracic spine to L5. Fractured fixation rod superior to the right L4 pedicle screw appears similar.  Osteopenia.  Chronic T12 compression fracture with about 50% loss of vertebral body height.  There is also a chronic T10 compression fracture, previously demonstrated by CT. Severe L5-S1 degenerative disc disease appears similar to prior exam with vacuum disc.   Atherosclerosis.  Sacral arcades appear intact.  Dysmorphic appearance of the iliac bone representing bone graft harvesting site.  IMPRESSION: 1.  No interval change.  Fractured right fixation rods superior to the bright L4 pedicle screw. 2. Unchanged loosening of both L5 pedicle screws.   Original Report Authenticated By: Andreas Newport, M.D.     Microbiology: Recent Results (from the past 240 hour(s))  URINE CULTURE     Status: Normal (Preliminary result)   Collection Time   07/06/12  4:51 PM      Component Value Range Status Comment   Specimen Description URINE, CLEAN CATCH   Final    Special Requests NONE   Final    Culture  Setup Time 07/07/2012 01:31   Final    Colony Count >=100,000 COLONIES/ML    Final    Culture PROTEUS MIRABILIS   Final    Report Status PENDING   Incomplete   MRSA PCR SCREENING     Status: Normal   Collection Time   07/07/12  2:08 AM      Component Value Range Status Comment   MRSA by PCR NEGATIVE  NEGATIVE Final      Labs: Basic Metabolic Panel:  Lab 07/08/12 5784 07/06/12 1932 07/06/12 1730 07/06/12 1600  NA 141 138 -- 137  K 3.4* 6.2* 6.7* 5.9*  CL 101 106 -- 101  CO2 32 -- -- 28  GLUCOSE 96 120* -- 188*  BUN 25* 30* -- 29*  CREATININE 1.10 1.20* -- 1.15*  CALCIUM 9.1 -- -- 9.7  MG -- -- -- --  PHOS -- -- -- --   Liver Function Tests: No results found for this basename: AST:5,ALT:5,ALKPHOS:5,BILITOT:5,PROT:5,ALBUMIN:5 in the last 168 hours No results found for this basename: LIPASE:5,AMYLASE:5 in the last 168 hours No results found for this basename: AMMONIA:5 in the last 168 hours CBC:  Lab 07/08/12 0815 07/06/12 1932 07/06/12 1600  WBC 11.6* -- 12.9*  NEUTROABS -- -- --  HGB 9.5* 9.9* 9.5*  HCT 30.3* 29.0* 29.8*  MCV 85.8 -- 85.4  PLT 229 -- 240   Cardiac Enzymes: No results found for this basename: CKTOTAL:5,CKMB:5,CKMBINDEX:5,TROPONINI:5 in the last 168 hours BNP: BNP (last 3 results)  Basename 03/18/12 0400 03/17/12 0530  PROBNP 3700.0* 1429.0*   CBG: No results found for this basename: GLUCAP:5 in the last 168 hours     Signed:  Penny Pia  [facility] 07/08/2012, 2:24 PM

## 2012-07-08 NOTE — Progress Notes (Signed)
   CARE MANAGEMENT NOTE 07/08/2012  Patient:  [name], [name]   Account Number:  [id number]  Date Initiated:  07/08/2012  Documentation initiated by:  Jiles Crocker  Subjective/Objective Assessment:   ADMITTED WITH HYPERKALEMIA     Action/Plan:   PCP: Oneal Grout, MD  PATIENT RESIDES IN A NURSING FACILITY; SOC WORKER REFERRAL PLACED   Anticipated DC Date:  07/15/2012   Anticipated DC Plan:  SKILLED NURSING FACILITY  In-house referral  Clinical Social Worker      DC Planning Services  CM consult          Status of service:  In process, will continue to follow Medicare Important Message given?  NA - LOS <3 / Initial given by admissions (If response is "NO", the following Medicare IM given date fields will be blank)  Per UR Regulation:  Reviewed for med. necessity/level of care/duration of stay  Comments:  07/08/2012- B Vika Buske RN,BSN,MHA

## 2012-07-08 NOTE — Progress Notes (Signed)
Patient is set to discharge back to [facility] today. CSW left message for patient's son, Ramon Dredge. [facility] called for transport. Discharge packet in [place] with FL2, Prescription, chart copy and AVS.   Unice Bailey, LCSW [facility] Clinical Social Worker cell #: [phone]

## 2012-07-09 DIAGNOSIS — R1312 Dysphagia, oropharyngeal phase: Secondary | ICD-10-CM | POA: Diagnosis not present

## 2012-07-09 DIAGNOSIS — E039 Hypothyroidism, unspecified: Secondary | ICD-10-CM | POA: Diagnosis not present

## 2012-07-09 DIAGNOSIS — I10 Essential (primary) hypertension: Secondary | ICD-10-CM | POA: Diagnosis not present

## 2012-07-09 DIAGNOSIS — M6281 Muscle weakness (generalized): Secondary | ICD-10-CM | POA: Diagnosis not present

## 2012-07-09 DIAGNOSIS — I6990 Unspecified sequelae of unspecified cerebrovascular disease: Secondary | ICD-10-CM | POA: Diagnosis not present

## 2012-07-09 DIAGNOSIS — R269 Unspecified abnormalities of gait and mobility: Secondary | ICD-10-CM | POA: Diagnosis not present

## 2012-07-09 DIAGNOSIS — M545 Low back pain: Secondary | ICD-10-CM | POA: Diagnosis not present

## 2012-07-09 DIAGNOSIS — N390 Urinary tract infection, site not specified: Secondary | ICD-10-CM | POA: Diagnosis not present

## 2012-07-12 DIAGNOSIS — M6281 Muscle weakness (generalized): Secondary | ICD-10-CM | POA: Diagnosis not present

## 2012-07-12 DIAGNOSIS — R1312 Dysphagia, oropharyngeal phase: Secondary | ICD-10-CM | POA: Diagnosis not present

## 2012-07-12 DIAGNOSIS — R269 Unspecified abnormalities of gait and mobility: Secondary | ICD-10-CM | POA: Diagnosis not present

## 2012-07-13 DIAGNOSIS — E785 Hyperlipidemia, unspecified: Secondary | ICD-10-CM | POA: Diagnosis not present

## 2012-07-13 DIAGNOSIS — M6281 Muscle weakness (generalized): Secondary | ICD-10-CM | POA: Diagnosis not present

## 2012-07-13 DIAGNOSIS — R1312 Dysphagia, oropharyngeal phase: Secondary | ICD-10-CM | POA: Diagnosis not present

## 2012-07-13 DIAGNOSIS — E875 Hyperkalemia: Secondary | ICD-10-CM | POA: Diagnosis not present

## 2012-07-13 DIAGNOSIS — D649 Anemia, unspecified: Secondary | ICD-10-CM | POA: Diagnosis not present

## 2012-07-13 DIAGNOSIS — N390 Urinary tract infection, site not specified: Secondary | ICD-10-CM | POA: Diagnosis not present

## 2012-07-13 DIAGNOSIS — E039 Hypothyroidism, unspecified: Secondary | ICD-10-CM | POA: Diagnosis not present

## 2012-07-13 DIAGNOSIS — G92 Toxic encephalopathy: Secondary | ICD-10-CM | POA: Diagnosis not present

## 2012-07-13 DIAGNOSIS — R269 Unspecified abnormalities of gait and mobility: Secondary | ICD-10-CM | POA: Diagnosis not present

## 2012-07-14 DIAGNOSIS — F329 Major depressive disorder, single episode, unspecified: Secondary | ICD-10-CM | POA: Diagnosis not present

## 2012-07-14 DIAGNOSIS — F411 Generalized anxiety disorder: Secondary | ICD-10-CM | POA: Diagnosis not present

## 2012-07-15 DIAGNOSIS — E875 Hyperkalemia: Secondary | ICD-10-CM | POA: Diagnosis not present

## 2012-07-15 DIAGNOSIS — R269 Unspecified abnormalities of gait and mobility: Secondary | ICD-10-CM | POA: Diagnosis not present

## 2012-07-15 DIAGNOSIS — M6281 Muscle weakness (generalized): Secondary | ICD-10-CM | POA: Diagnosis not present

## 2012-07-15 DIAGNOSIS — D649 Anemia, unspecified: Secondary | ICD-10-CM | POA: Diagnosis not present

## 2012-07-15 DIAGNOSIS — R1312 Dysphagia, oropharyngeal phase: Secondary | ICD-10-CM | POA: Diagnosis not present

## 2012-07-16 DIAGNOSIS — M6281 Muscle weakness (generalized): Secondary | ICD-10-CM | POA: Diagnosis not present

## 2012-07-16 DIAGNOSIS — R269 Unspecified abnormalities of gait and mobility: Secondary | ICD-10-CM | POA: Diagnosis not present

## 2012-07-16 DIAGNOSIS — R1312 Dysphagia, oropharyngeal phase: Secondary | ICD-10-CM | POA: Diagnosis not present

## 2012-07-17 DIAGNOSIS — M6281 Muscle weakness (generalized): Secondary | ICD-10-CM | POA: Diagnosis not present

## 2012-07-17 DIAGNOSIS — R1312 Dysphagia, oropharyngeal phase: Secondary | ICD-10-CM | POA: Diagnosis not present

## 2012-07-17 DIAGNOSIS — R269 Unspecified abnormalities of gait and mobility: Secondary | ICD-10-CM | POA: Diagnosis not present

## 2012-07-18 DIAGNOSIS — M6281 Muscle weakness (generalized): Secondary | ICD-10-CM | POA: Diagnosis not present

## 2012-07-18 DIAGNOSIS — R1312 Dysphagia, oropharyngeal phase: Secondary | ICD-10-CM | POA: Diagnosis not present

## 2012-07-18 DIAGNOSIS — R269 Unspecified abnormalities of gait and mobility: Secondary | ICD-10-CM | POA: Diagnosis not present

## 2012-07-19 DIAGNOSIS — R1312 Dysphagia, oropharyngeal phase: Secondary | ICD-10-CM | POA: Diagnosis not present

## 2012-07-19 DIAGNOSIS — R269 Unspecified abnormalities of gait and mobility: Secondary | ICD-10-CM | POA: Diagnosis not present

## 2012-07-19 DIAGNOSIS — M6281 Muscle weakness (generalized): Secondary | ICD-10-CM | POA: Diagnosis not present

## 2012-07-20 DIAGNOSIS — M6281 Muscle weakness (generalized): Secondary | ICD-10-CM | POA: Diagnosis not present

## 2012-07-20 DIAGNOSIS — R1312 Dysphagia, oropharyngeal phase: Secondary | ICD-10-CM | POA: Diagnosis not present

## 2012-07-20 DIAGNOSIS — R269 Unspecified abnormalities of gait and mobility: Secondary | ICD-10-CM | POA: Diagnosis not present

## 2012-07-21 DIAGNOSIS — R1312 Dysphagia, oropharyngeal phase: Secondary | ICD-10-CM | POA: Diagnosis not present

## 2012-07-21 DIAGNOSIS — M6281 Muscle weakness (generalized): Secondary | ICD-10-CM | POA: Diagnosis not present

## 2012-07-21 DIAGNOSIS — R269 Unspecified abnormalities of gait and mobility: Secondary | ICD-10-CM | POA: Diagnosis not present

## 2012-07-22 DIAGNOSIS — M546 Pain in thoracic spine: Secondary | ICD-10-CM | POA: Diagnosis not present

## 2012-07-22 DIAGNOSIS — M25559 Pain in unspecified hip: Secondary | ICD-10-CM | POA: Diagnosis not present

## 2012-07-22 DIAGNOSIS — M545 Low back pain, unspecified: Secondary | ICD-10-CM | POA: Diagnosis not present

## 2012-07-22 DIAGNOSIS — M542 Cervicalgia: Secondary | ICD-10-CM | POA: Diagnosis not present

## 2012-07-23 DIAGNOSIS — R1312 Dysphagia, oropharyngeal phase: Secondary | ICD-10-CM | POA: Diagnosis not present

## 2012-07-23 DIAGNOSIS — M6281 Muscle weakness (generalized): Secondary | ICD-10-CM | POA: Diagnosis not present

## 2012-07-23 DIAGNOSIS — R269 Unspecified abnormalities of gait and mobility: Secondary | ICD-10-CM | POA: Diagnosis not present

## 2012-07-24 DIAGNOSIS — R269 Unspecified abnormalities of gait and mobility: Secondary | ICD-10-CM | POA: Diagnosis not present

## 2012-07-24 DIAGNOSIS — M6281 Muscle weakness (generalized): Secondary | ICD-10-CM | POA: Diagnosis not present

## 2012-07-24 DIAGNOSIS — R1312 Dysphagia, oropharyngeal phase: Secondary | ICD-10-CM | POA: Diagnosis not present

## 2012-07-26 DIAGNOSIS — R269 Unspecified abnormalities of gait and mobility: Secondary | ICD-10-CM | POA: Diagnosis not present

## 2012-07-26 DIAGNOSIS — R1312 Dysphagia, oropharyngeal phase: Secondary | ICD-10-CM | POA: Diagnosis not present

## 2012-07-26 DIAGNOSIS — M6281 Muscle weakness (generalized): Secondary | ICD-10-CM | POA: Diagnosis not present

## 2012-07-27 DIAGNOSIS — M6281 Muscle weakness (generalized): Secondary | ICD-10-CM | POA: Diagnosis not present

## 2012-07-27 DIAGNOSIS — R1312 Dysphagia, oropharyngeal phase: Secondary | ICD-10-CM | POA: Diagnosis not present

## 2012-07-27 DIAGNOSIS — L820 Inflamed seborrheic keratosis: Secondary | ICD-10-CM | POA: Diagnosis not present

## 2012-07-27 DIAGNOSIS — R269 Unspecified abnormalities of gait and mobility: Secondary | ICD-10-CM | POA: Diagnosis not present

## 2012-07-27 DIAGNOSIS — L919 Hypertrophic disorder of the skin, unspecified: Secondary | ICD-10-CM | POA: Diagnosis not present

## 2012-07-28 DIAGNOSIS — Z7901 Long term (current) use of anticoagulants: Secondary | ICD-10-CM | POA: Diagnosis not present

## 2012-07-28 DIAGNOSIS — Z8673 Personal history of transient ischemic attack (TIA), and cerebral infarction without residual deficits: Secondary | ICD-10-CM | POA: Diagnosis not present

## 2012-07-28 DIAGNOSIS — I10 Essential (primary) hypertension: Secondary | ICD-10-CM | POA: Diagnosis not present

## 2012-07-28 DIAGNOSIS — I214 Non-ST elevation (NSTEMI) myocardial infarction: Secondary | ICD-10-CM | POA: Diagnosis not present

## 2012-07-28 DIAGNOSIS — R269 Unspecified abnormalities of gait and mobility: Secondary | ICD-10-CM | POA: Diagnosis not present

## 2012-07-28 DIAGNOSIS — R1312 Dysphagia, oropharyngeal phase: Secondary | ICD-10-CM | POA: Diagnosis not present

## 2012-07-28 DIAGNOSIS — I2510 Atherosclerotic heart disease of native coronary artery without angina pectoris: Secondary | ICD-10-CM | POA: Diagnosis not present

## 2012-07-28 DIAGNOSIS — M069 Rheumatoid arthritis, unspecified: Secondary | ICD-10-CM | POA: Diagnosis not present

## 2012-07-28 DIAGNOSIS — M6281 Muscle weakness (generalized): Secondary | ICD-10-CM | POA: Diagnosis not present

## 2012-07-29 DIAGNOSIS — R269 Unspecified abnormalities of gait and mobility: Secondary | ICD-10-CM | POA: Diagnosis not present

## 2012-07-29 DIAGNOSIS — M6281 Muscle weakness (generalized): Secondary | ICD-10-CM | POA: Diagnosis not present

## 2012-07-29 DIAGNOSIS — R1312 Dysphagia, oropharyngeal phase: Secondary | ICD-10-CM | POA: Diagnosis not present

## 2012-07-30 DIAGNOSIS — R1312 Dysphagia, oropharyngeal phase: Secondary | ICD-10-CM | POA: Diagnosis not present

## 2012-07-30 DIAGNOSIS — M6281 Muscle weakness (generalized): Secondary | ICD-10-CM | POA: Diagnosis not present

## 2012-07-30 DIAGNOSIS — R269 Unspecified abnormalities of gait and mobility: Secondary | ICD-10-CM | POA: Diagnosis not present

## 2012-08-03 DIAGNOSIS — R279 Unspecified lack of coordination: Secondary | ICD-10-CM | POA: Diagnosis not present

## 2012-08-03 DIAGNOSIS — R1311 Dysphagia, oral phase: Secondary | ICD-10-CM | POA: Diagnosis not present

## 2012-08-03 DIAGNOSIS — R269 Unspecified abnormalities of gait and mobility: Secondary | ICD-10-CM | POA: Diagnosis not present

## 2012-08-03 DIAGNOSIS — M6281 Muscle weakness (generalized): Secondary | ICD-10-CM | POA: Diagnosis not present

## 2012-08-04 DIAGNOSIS — R269 Unspecified abnormalities of gait and mobility: Secondary | ICD-10-CM | POA: Diagnosis not present

## 2012-08-04 DIAGNOSIS — R1311 Dysphagia, oral phase: Secondary | ICD-10-CM | POA: Diagnosis not present

## 2012-08-04 DIAGNOSIS — M6281 Muscle weakness (generalized): Secondary | ICD-10-CM | POA: Diagnosis not present

## 2012-08-04 DIAGNOSIS — R279 Unspecified lack of coordination: Secondary | ICD-10-CM | POA: Diagnosis not present

## 2012-08-05 DIAGNOSIS — M6281 Muscle weakness (generalized): Secondary | ICD-10-CM | POA: Diagnosis not present

## 2012-08-05 DIAGNOSIS — R1311 Dysphagia, oral phase: Secondary | ICD-10-CM | POA: Diagnosis not present

## 2012-08-05 DIAGNOSIS — R269 Unspecified abnormalities of gait and mobility: Secondary | ICD-10-CM | POA: Diagnosis not present

## 2012-08-05 DIAGNOSIS — R279 Unspecified lack of coordination: Secondary | ICD-10-CM | POA: Diagnosis not present

## 2012-08-06 DIAGNOSIS — R279 Unspecified lack of coordination: Secondary | ICD-10-CM | POA: Diagnosis not present

## 2012-08-06 DIAGNOSIS — R1311 Dysphagia, oral phase: Secondary | ICD-10-CM | POA: Diagnosis not present

## 2012-08-06 DIAGNOSIS — R269 Unspecified abnormalities of gait and mobility: Secondary | ICD-10-CM | POA: Diagnosis not present

## 2012-08-06 DIAGNOSIS — M6281 Muscle weakness (generalized): Secondary | ICD-10-CM | POA: Diagnosis not present

## 2012-08-09 DIAGNOSIS — R279 Unspecified lack of coordination: Secondary | ICD-10-CM | POA: Diagnosis not present

## 2012-08-09 DIAGNOSIS — M6281 Muscle weakness (generalized): Secondary | ICD-10-CM | POA: Diagnosis not present

## 2012-08-09 DIAGNOSIS — R1311 Dysphagia, oral phase: Secondary | ICD-10-CM | POA: Diagnosis not present

## 2012-08-09 DIAGNOSIS — R269 Unspecified abnormalities of gait and mobility: Secondary | ICD-10-CM | POA: Diagnosis not present

## 2012-08-11 DIAGNOSIS — R1311 Dysphagia, oral phase: Secondary | ICD-10-CM | POA: Diagnosis not present

## 2012-08-11 DIAGNOSIS — R269 Unspecified abnormalities of gait and mobility: Secondary | ICD-10-CM | POA: Diagnosis not present

## 2012-08-11 DIAGNOSIS — R279 Unspecified lack of coordination: Secondary | ICD-10-CM | POA: Diagnosis not present

## 2012-08-11 DIAGNOSIS — M6281 Muscle weakness (generalized): Secondary | ICD-10-CM | POA: Diagnosis not present

## 2012-08-12 DIAGNOSIS — M6281 Muscle weakness (generalized): Secondary | ICD-10-CM | POA: Diagnosis not present

## 2012-08-12 DIAGNOSIS — R269 Unspecified abnormalities of gait and mobility: Secondary | ICD-10-CM | POA: Diagnosis not present

## 2012-08-12 DIAGNOSIS — R1311 Dysphagia, oral phase: Secondary | ICD-10-CM | POA: Diagnosis not present

## 2012-08-12 DIAGNOSIS — R279 Unspecified lack of coordination: Secondary | ICD-10-CM | POA: Diagnosis not present

## 2012-08-25 DIAGNOSIS — D649 Anemia, unspecified: Secondary | ICD-10-CM | POA: Diagnosis not present

## 2012-08-25 DIAGNOSIS — E875 Hyperkalemia: Secondary | ICD-10-CM | POA: Diagnosis not present

## 2012-08-26 DIAGNOSIS — I10 Essential (primary) hypertension: Secondary | ICD-10-CM | POA: Diagnosis not present

## 2012-08-27 DIAGNOSIS — N390 Urinary tract infection, site not specified: Secondary | ICD-10-CM | POA: Diagnosis not present

## 2012-09-22 DIAGNOSIS — L909 Atrophic disorder of skin, unspecified: Secondary | ICD-10-CM | POA: Diagnosis not present

## 2012-09-22 DIAGNOSIS — L820 Inflamed seborrheic keratosis: Secondary | ICD-10-CM | POA: Diagnosis not present

## 2012-10-25 ENCOUNTER — Other Ambulatory Visit: Payer: Self-pay | Admitting: *Deleted

## 2012-10-25 MED ORDER — LORAZEPAM 0.5 MG PO TABS: ORAL_TABLET | ORAL | Status: DC

## 2012-10-27 DIAGNOSIS — F411 Generalized anxiety disorder: Secondary | ICD-10-CM | POA: Diagnosis not present

## 2012-10-27 DIAGNOSIS — F329 Major depressive disorder, single episode, unspecified: Secondary | ICD-10-CM | POA: Diagnosis not present

## 2012-10-28 IMAGING — CR DG SHOULDER 2+V*R*
4 series · 4 of 4 positions shown · non-contrast
Comparison: None

CLINICAL DATA: Fall.  Pain

RIGHT SHOULDER - 2+ VIEW

[t shoulder internal right]
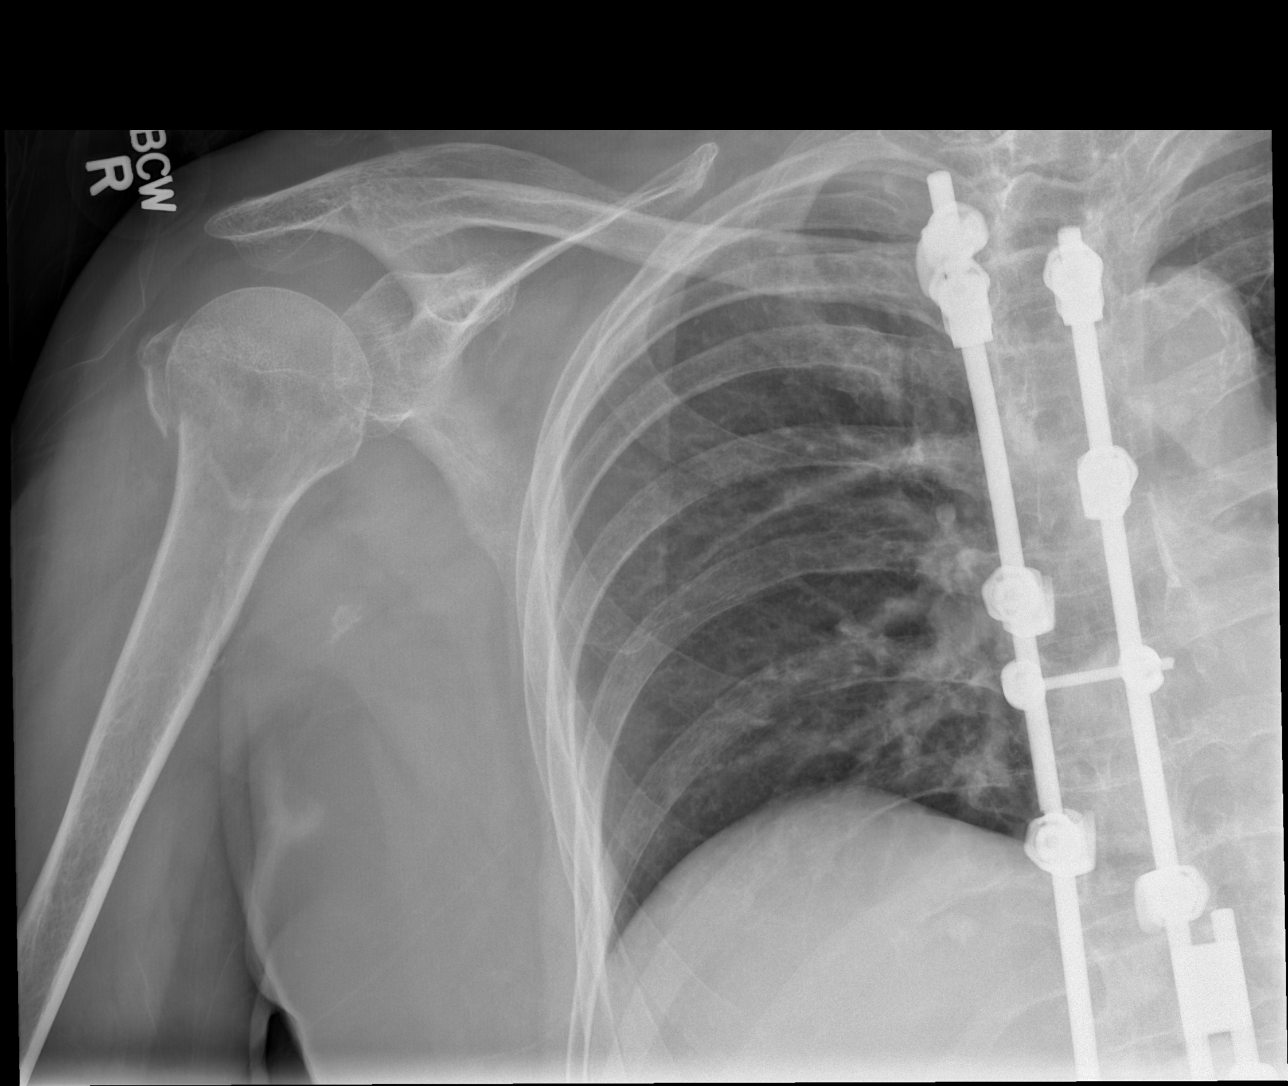

[t shoulder external right]
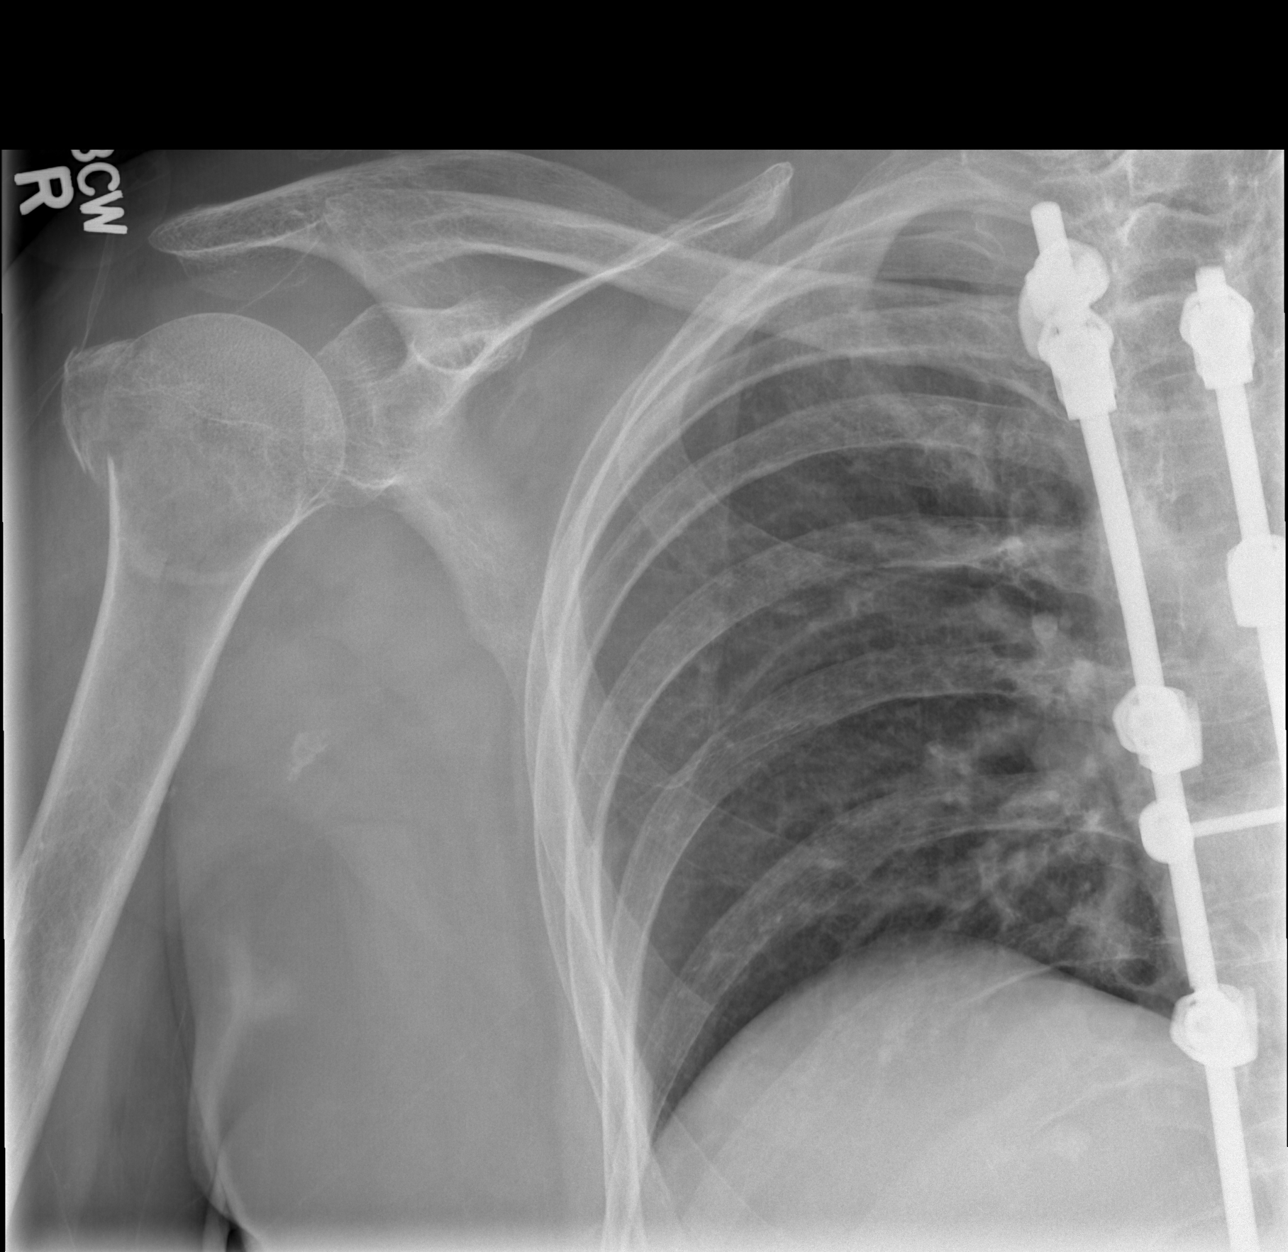

[t scapula y-view right (1 of 2)]
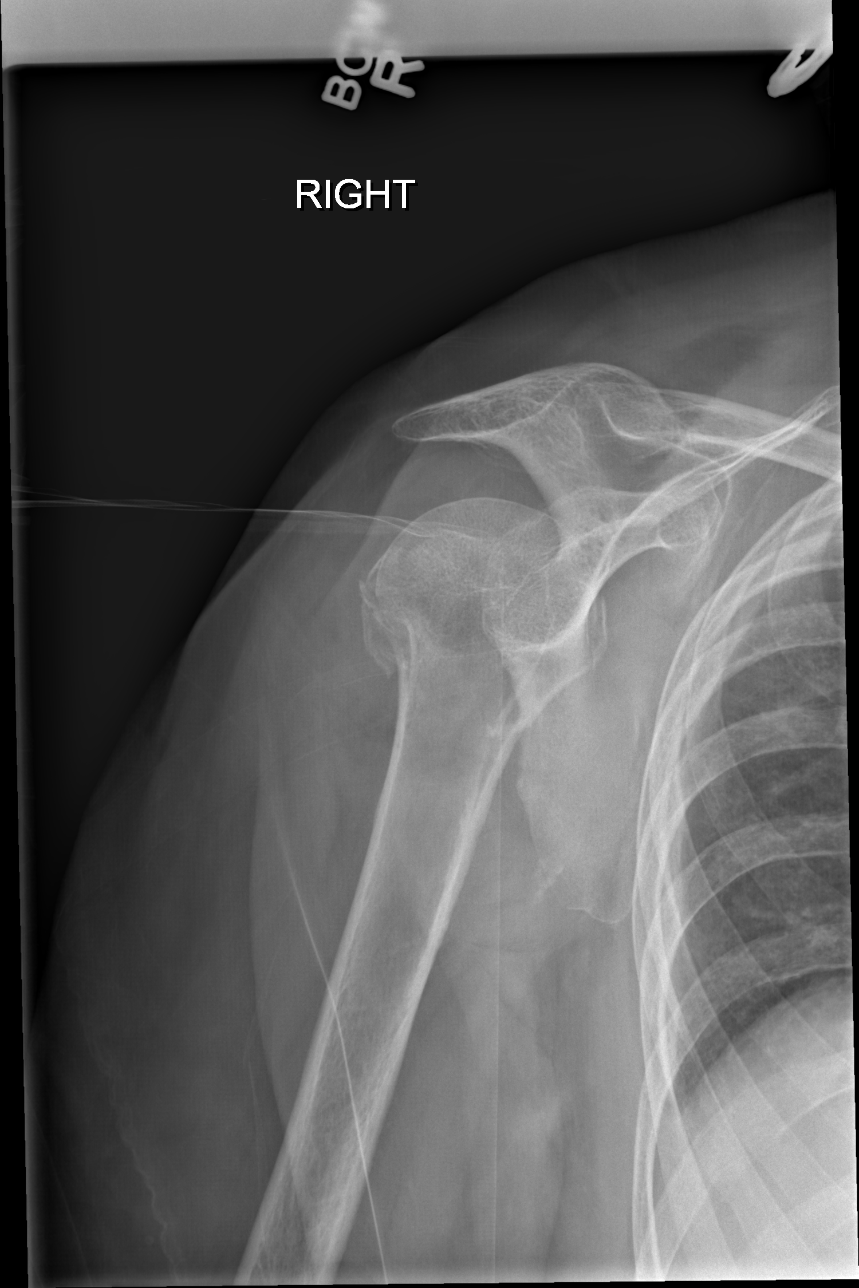

[t scapula y-view right (2 of 2)]
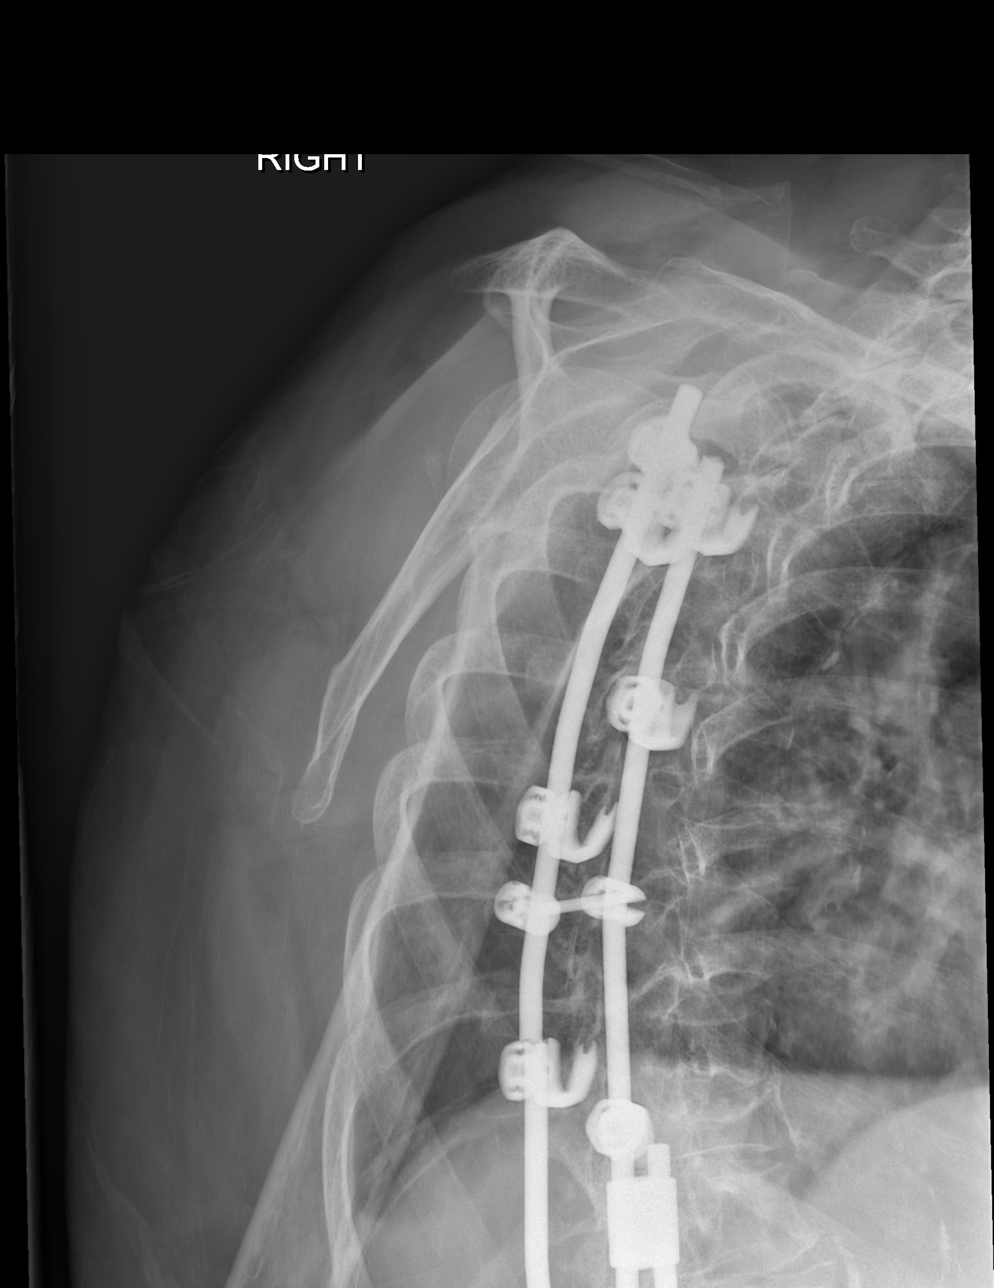

[4 of 4 positions shown; findings below may reference images not displayed]

FINDINGS: There is an acute fracture deformity involving the
humeral head. Mild lateral displacement of the greater tuberosity.

No dislocation identified
IMPRESSION: 1. Acute comminuted fracture involves the humeral head.  More
detailed assessment of the fracture may be obtained with CT of the
shoulder.

## 2012-10-28 IMAGING — CR DG ELBOW COMPLETE 3+V*R*
4 series · 4 of 4 positions shown · non-contrast
Comparison: None

CLINICAL DATA: Fall.  Pain

RIGHT ELBOW - COMPLETE 3+ VIEW

[t elbow ap right]
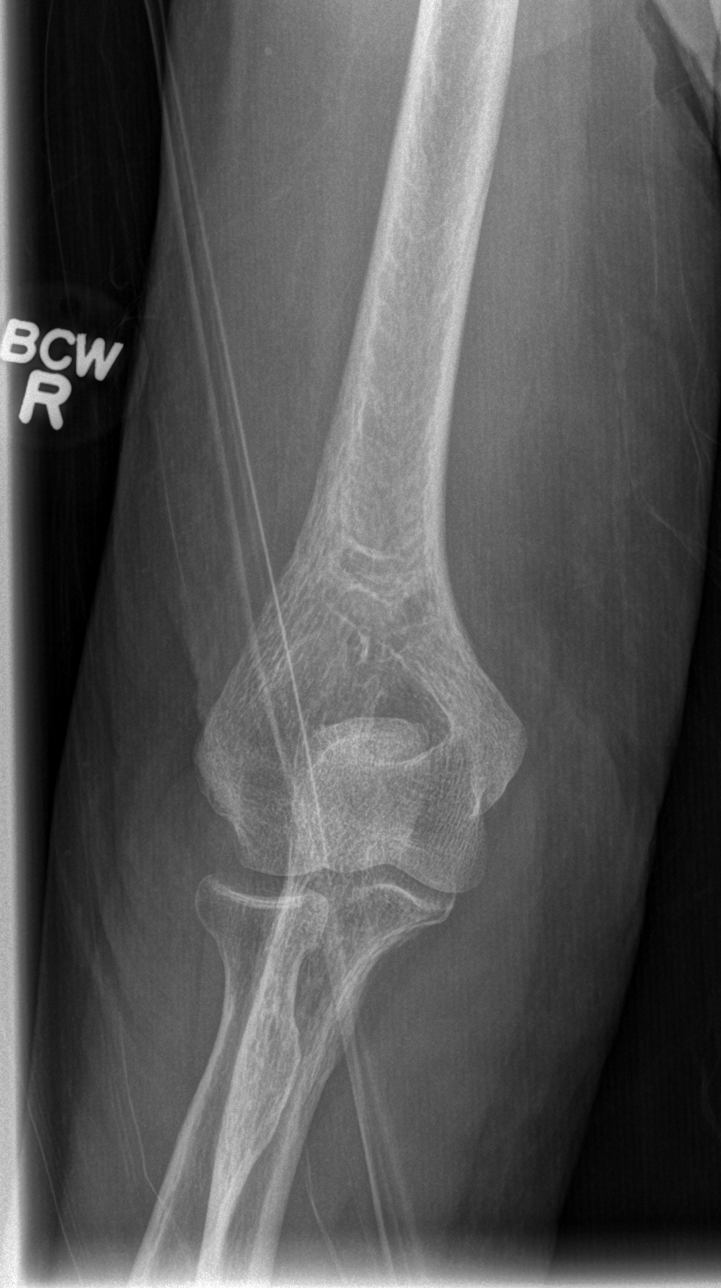

[t elbow obl right (1 of 2)]
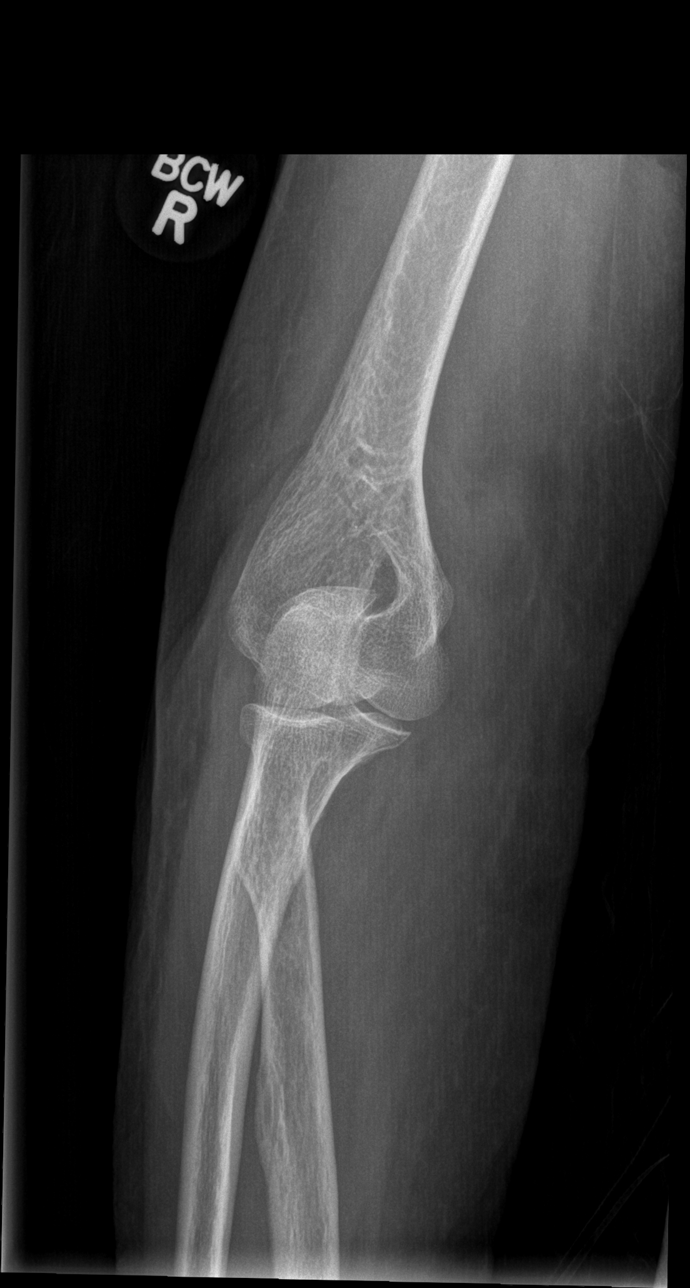

[t elbow obl right (2 of 2)]
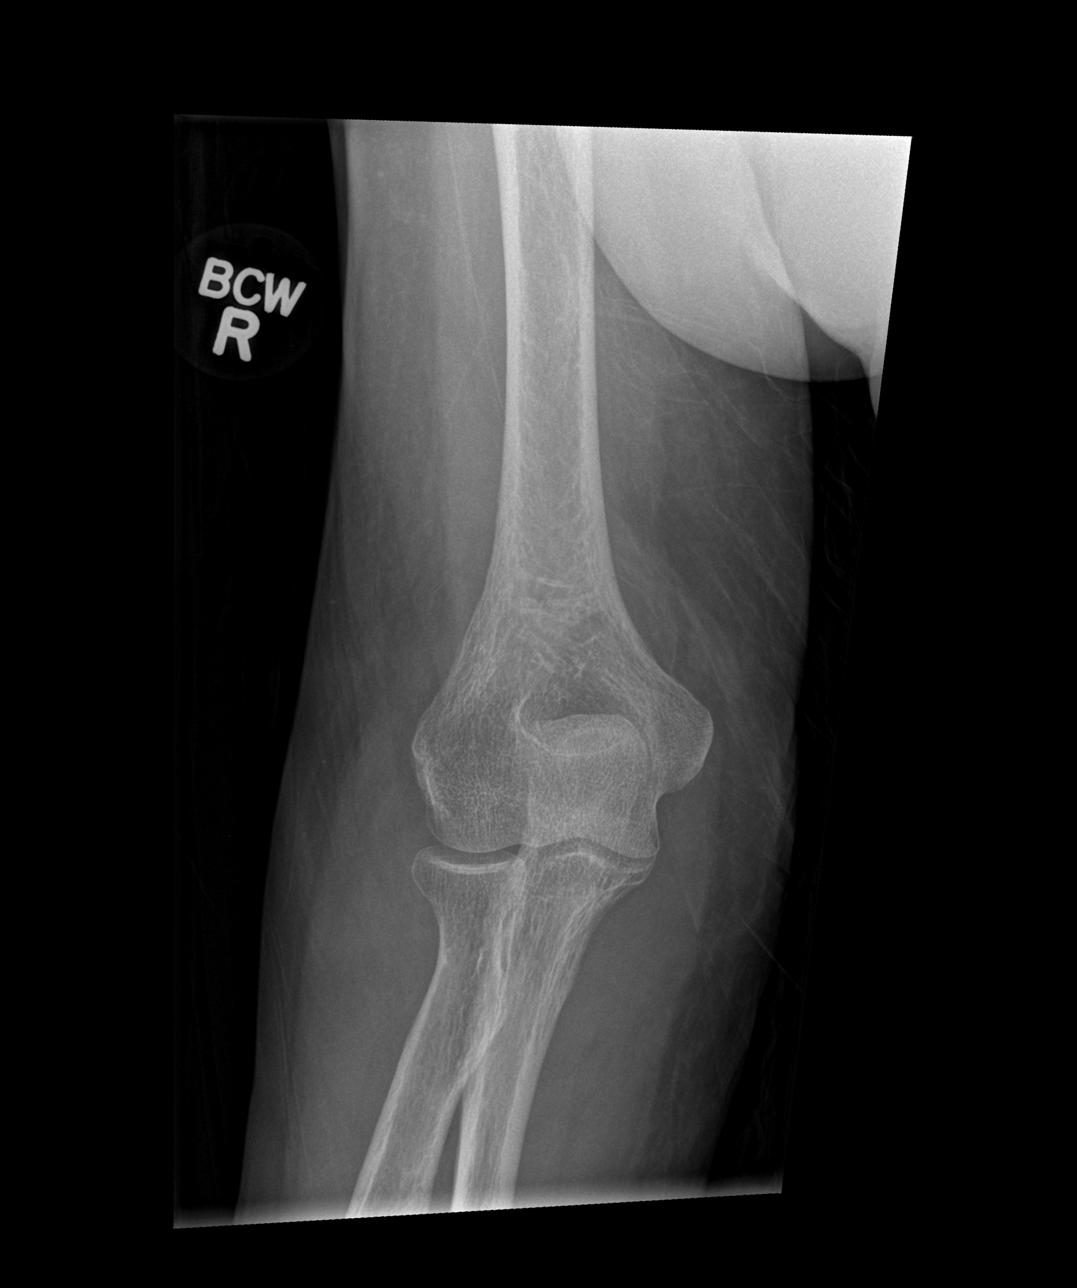

[x elbow lat right]
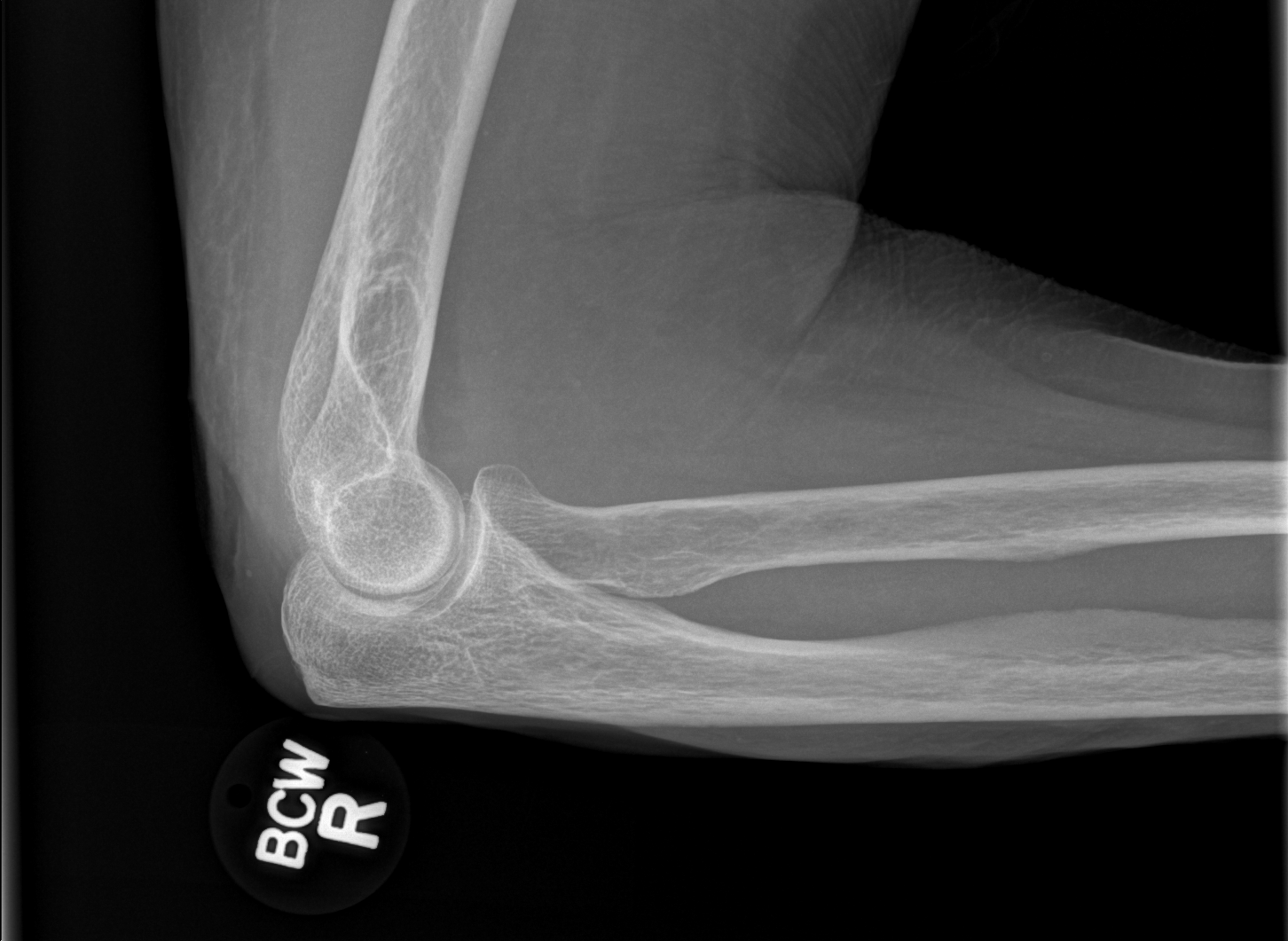

[4 of 4 positions shown; findings below may reference images not displayed]

FINDINGS: There is no evidence of fracture or dislocation.  There
is no evidence of arthropathy or other focal bone abnormality.
Soft tissues are unremarkable.
IMPRESSION: Negative exam.

## 2012-11-01 DIAGNOSIS — M79609 Pain in unspecified limb: Secondary | ICD-10-CM | POA: Diagnosis not present

## 2012-11-01 DIAGNOSIS — B351 Tinea unguium: Secondary | ICD-10-CM | POA: Diagnosis not present

## 2012-11-04 DIAGNOSIS — M810 Age-related osteoporosis without current pathological fracture: Secondary | ICD-10-CM | POA: Diagnosis not present

## 2012-11-04 DIAGNOSIS — M159 Polyosteoarthritis, unspecified: Secondary | ICD-10-CM | POA: Diagnosis not present

## 2012-11-04 DIAGNOSIS — IMO0002 Reserved for concepts with insufficient information to code with codable children: Secondary | ICD-10-CM | POA: Diagnosis not present

## 2012-11-09 ENCOUNTER — Encounter: Payer: Self-pay | Admitting: Nurse Practitioner

## 2012-11-09 DIAGNOSIS — R1310 Dysphagia, unspecified: Secondary | ICD-10-CM | POA: Diagnosis not present

## 2012-11-09 DIAGNOSIS — I69959 Hemiplegia and hemiparesis following unspecified cerebrovascular disease affecting unspecified side: Secondary | ICD-10-CM | POA: Diagnosis not present

## 2012-11-09 DIAGNOSIS — Z5189 Encounter for other specified aftercare: Secondary | ICD-10-CM | POA: Diagnosis not present

## 2012-11-11 DIAGNOSIS — N390 Urinary tract infection, site not specified: Secondary | ICD-10-CM | POA: Diagnosis not present

## 2012-11-30 ENCOUNTER — Encounter: Payer: Self-pay | Admitting: Nurse Practitioner

## 2012-12-18 ENCOUNTER — Encounter (HOSPITAL_COMMUNITY): Payer: Self-pay

## 2012-12-18 ENCOUNTER — Emergency Department (HOSPITAL_COMMUNITY): Payer: Medicare Other

## 2012-12-18 ENCOUNTER — Other Ambulatory Visit: Payer: Self-pay

## 2012-12-18 ENCOUNTER — Inpatient Hospital Stay (HOSPITAL_COMMUNITY)
Admission: EM | Admit: 2012-12-18 | Discharge: 2012-12-22 | DRG: 872 | Disposition: A | Discharge status: Skilled Nursing Facility | Payer: Medicare Other | Attending: Internal Medicine | Admitting: Internal Medicine

## 2012-12-18 DIAGNOSIS — M7989 Other specified soft tissue disorders: Secondary | ICD-10-CM | POA: Diagnosis not present

## 2012-12-18 DIAGNOSIS — R0602 Shortness of breath: Secondary | ICD-10-CM

## 2012-12-18 DIAGNOSIS — E875 Hyperkalemia: Secondary | ICD-10-CM

## 2012-12-18 DIAGNOSIS — E440 Moderate protein-calorie malnutrition: Secondary | ICD-10-CM | POA: Diagnosis present

## 2012-12-18 DIAGNOSIS — E2749 Other adrenocortical insufficiency: Secondary | ICD-10-CM | POA: Diagnosis present

## 2012-12-18 DIAGNOSIS — I69959 Hemiplegia and hemiparesis following unspecified cerebrovascular disease affecting unspecified side: Secondary | ICD-10-CM | POA: Diagnosis not present

## 2012-12-18 DIAGNOSIS — E039 Hypothyroidism, unspecified: Secondary | ICD-10-CM | POA: Diagnosis present

## 2012-12-18 DIAGNOSIS — E876 Hypokalemia: Secondary | ICD-10-CM | POA: Diagnosis present

## 2012-12-18 DIAGNOSIS — A419 Sepsis, unspecified organism: Secondary | ICD-10-CM | POA: Diagnosis not present

## 2012-12-18 DIAGNOSIS — K5289 Other specified noninfective gastroenteritis and colitis: Secondary | ICD-10-CM | POA: Diagnosis not present

## 2012-12-18 DIAGNOSIS — I10 Essential (primary) hypertension: Secondary | ICD-10-CM | POA: Diagnosis not present

## 2012-12-18 DIAGNOSIS — R509 Fever, unspecified: Secondary | ICD-10-CM | POA: Diagnosis not present

## 2012-12-18 DIAGNOSIS — M069 Rheumatoid arthritis, unspecified: Secondary | ICD-10-CM | POA: Diagnosis not present

## 2012-12-18 DIAGNOSIS — K922 Gastrointestinal hemorrhage, unspecified: Secondary | ICD-10-CM

## 2012-12-18 DIAGNOSIS — K219 Gastro-esophageal reflux disease without esophagitis: Secondary | ICD-10-CM | POA: Diagnosis present

## 2012-12-18 DIAGNOSIS — K449 Diaphragmatic hernia without obstruction or gangrene: Secondary | ICD-10-CM

## 2012-12-18 DIAGNOSIS — R1032 Left lower quadrant pain: Secondary | ICD-10-CM | POA: Diagnosis not present

## 2012-12-18 DIAGNOSIS — A0472 Enterocolitis due to Clostridium difficile, not specified as recurrent: Secondary | ICD-10-CM | POA: Diagnosis not present

## 2012-12-18 DIAGNOSIS — M549 Dorsalgia, unspecified: Secondary | ICD-10-CM

## 2012-12-18 DIAGNOSIS — D72829 Elevated white blood cell count, unspecified: Secondary | ICD-10-CM | POA: Diagnosis not present

## 2012-12-18 DIAGNOSIS — R262 Difficulty in walking, not elsewhere classified: Secondary | ICD-10-CM | POA: Diagnosis not present

## 2012-12-18 DIAGNOSIS — I639 Cerebral infarction, unspecified: Secondary | ICD-10-CM

## 2012-12-18 DIAGNOSIS — E2740 Unspecified adrenocortical insufficiency: Secondary | ICD-10-CM

## 2012-12-18 DIAGNOSIS — F329 Major depressive disorder, single episode, unspecified: Secondary | ICD-10-CM | POA: Diagnosis present

## 2012-12-18 DIAGNOSIS — N179 Acute kidney failure, unspecified: Secondary | ICD-10-CM | POA: Diagnosis present

## 2012-12-18 DIAGNOSIS — R2681 Unsteadiness on feet: Secondary | ICD-10-CM

## 2012-12-18 DIAGNOSIS — N390 Urinary tract infection, site not specified: Secondary | ICD-10-CM

## 2012-12-18 DIAGNOSIS — M1990 Unspecified osteoarthritis, unspecified site: Secondary | ICD-10-CM

## 2012-12-18 DIAGNOSIS — R300 Dysuria: Secondary | ICD-10-CM | POA: Diagnosis not present

## 2012-12-18 DIAGNOSIS — B999 Unspecified infectious disease: Secondary | ICD-10-CM | POA: Diagnosis not present

## 2012-12-18 DIAGNOSIS — I679 Cerebrovascular disease, unspecified: Secondary | ICD-10-CM | POA: Diagnosis not present

## 2012-12-18 DIAGNOSIS — K529 Noninfective gastroenteritis and colitis, unspecified: Secondary | ICD-10-CM

## 2012-12-18 DIAGNOSIS — Z9181 History of falling: Secondary | ICD-10-CM | POA: Diagnosis not present

## 2012-12-18 DIAGNOSIS — F3289 Other specified depressive episodes: Secondary | ICD-10-CM | POA: Diagnosis present

## 2012-12-18 DIAGNOSIS — Z7901 Long term (current) use of anticoagulants: Secondary | ICD-10-CM

## 2012-12-18 DIAGNOSIS — M79609 Pain in unspecified limb: Secondary | ICD-10-CM

## 2012-12-18 DIAGNOSIS — R6889 Other general symptoms and signs: Secondary | ICD-10-CM | POA: Diagnosis not present

## 2012-12-18 DIAGNOSIS — D649 Anemia, unspecified: Secondary | ICD-10-CM | POA: Diagnosis not present

## 2012-12-18 DIAGNOSIS — E785 Hyperlipidemia, unspecified: Secondary | ICD-10-CM | POA: Diagnosis not present

## 2012-12-18 DIAGNOSIS — G459 Transient cerebral ischemic attack, unspecified: Secondary | ICD-10-CM | POA: Diagnosis not present

## 2012-12-18 DIAGNOSIS — R109 Unspecified abdominal pain: Secondary | ICD-10-CM | POA: Diagnosis not present

## 2012-12-18 DIAGNOSIS — K279 Peptic ulcer, site unspecified, unspecified as acute or chronic, without hemorrhage or perforation: Secondary | ICD-10-CM

## 2012-12-18 DIAGNOSIS — I219 Acute myocardial infarction, unspecified: Secondary | ICD-10-CM

## 2012-12-18 DIAGNOSIS — I2510 Atherosclerotic heart disease of native coronary artery without angina pectoris: Secondary | ICD-10-CM

## 2012-12-18 DIAGNOSIS — J9819 Other pulmonary collapse: Secondary | ICD-10-CM | POA: Diagnosis not present

## 2012-12-18 DIAGNOSIS — F419 Anxiety disorder, unspecified: Secondary | ICD-10-CM

## 2012-12-18 DIAGNOSIS — M62838 Other muscle spasm: Secondary | ICD-10-CM | POA: Diagnosis not present

## 2012-12-18 DIAGNOSIS — A0471 Enterocolitis due to Clostridium difficile, recurrent: Secondary | ICD-10-CM

## 2012-12-18 DIAGNOSIS — F411 Generalized anxiety disorder: Secondary | ICD-10-CM | POA: Diagnosis present

## 2012-12-18 DIAGNOSIS — I6990 Unspecified sequelae of unspecified cerebrovascular disease: Secondary | ICD-10-CM | POA: Diagnosis not present

## 2012-12-18 DIAGNOSIS — N289 Disorder of kidney and ureter, unspecified: Secondary | ICD-10-CM

## 2012-12-18 DIAGNOSIS — R1310 Dysphagia, unspecified: Secondary | ICD-10-CM

## 2012-12-18 DIAGNOSIS — M6281 Muscle weakness (generalized): Secondary | ICD-10-CM | POA: Diagnosis not present

## 2012-12-18 LAB — CBC WITH DIFFERENTIAL/PLATELET
Basophils Absolute: 0.1 10*3/uL (ref 0.0–0.1)
Basophils Relative: 0 % (ref 0–1)
Eosinophils Relative: 2 % (ref 0–5)
HCT: 36.7 % (ref 36.0–46.0)
MCH: 27.9 pg (ref 26.0–34.0)
MCHC: 32.4 g/dL (ref 30.0–36.0)
MCV: 86.2 fL (ref 78.0–100.0)
Monocytes Absolute: 1.2 10*3/uL — ABNORMAL HIGH (ref 0.1–1.0)
RDW: 15.3 % (ref 11.5–15.5)

## 2012-12-18 LAB — BASIC METABOLIC PANEL
BUN: 26 mg/dL — ABNORMAL HIGH (ref 6–23)
CO2: 29 mEq/L (ref 19–32)
Chloride: 100 mEq/L (ref 96–112)
Creatinine, Ser: 1.32 mg/dL — ABNORMAL HIGH (ref 0.50–1.10)

## 2012-12-18 LAB — URINALYSIS, ROUTINE W REFLEX MICROSCOPIC
Bilirubin Urine: NEGATIVE
Hgb urine dipstick: NEGATIVE
Protein, ur: NEGATIVE mg/dL
Urobilinogen, UA: 0.2 mg/dL (ref 0.0–1.0)

## 2012-12-18 LAB — URINE MICROSCOPIC-ADD ON

## 2012-12-18 MED ORDER — HYDROCODONE-ACETAMINOPHEN 5-325 MG PO TABS
1.0000 | ORAL_TABLET | Freq: Four times a day (QID) | ORAL | Status: DC | PRN
  Administered 2012-12-18 – 2012-12-21 (×4): 1 via ORAL
  Filled 2012-12-18 (×4): qty 1

## 2012-12-18 MED ORDER — ATORVASTATIN CALCIUM 40 MG PO TABS
40.0000 mg | ORAL_TABLET | Freq: Every day | ORAL | Status: DC
  Administered 2012-12-19 – 2012-12-21 (×3): 40 mg via ORAL
  Filled 2012-12-18 (×4): qty 1

## 2012-12-18 MED ORDER — SODIUM CHLORIDE 0.9 % IV SOLN
Freq: Once | INTRAVENOUS | Status: AC
  Administered 2012-12-18: 12:00:00 via INTRAVENOUS

## 2012-12-18 MED ORDER — ENOXAPARIN SODIUM 40 MG/0.4ML ~~LOC~~ SOLN
40.0000 mg | SUBCUTANEOUS | Status: DC
  Administered 2012-12-18 – 2012-12-21 (×4): 40 mg via SUBCUTANEOUS
  Filled 2012-12-18 (×5): qty 0.4

## 2012-12-18 MED ORDER — PIPERACILLIN-TAZOBACTAM 3.375 G IVPB
3.3750 g | Freq: Three times a day (TID) | INTRAVENOUS | Status: DC
  Administered 2012-12-18 – 2012-12-20 (×6): 3.375 g via INTRAVENOUS
  Filled 2012-12-18 (×9): qty 50

## 2012-12-18 MED ORDER — LORAZEPAM 0.5 MG PO TABS
0.5000 mg | ORAL_TABLET | Freq: Every evening | ORAL | Status: DC | PRN
  Administered 2012-12-18 – 2012-12-21 (×4): 0.5 mg via ORAL
  Filled 2012-12-18 (×4): qty 1

## 2012-12-18 MED ORDER — POLYETHYLENE GLYCOL 3350 17 G PO PACK
17.0000 g | PACK | Freq: Every day | ORAL | Status: DC | PRN
  Filled 2012-12-18: qty 1

## 2012-12-18 MED ORDER — PREDNISONE 10 MG PO TABS
10.0000 mg | ORAL_TABLET | Freq: Every day | ORAL | Status: DC
  Administered 2012-12-19 – 2012-12-22 (×4): 10 mg via ORAL
  Filled 2012-12-18 (×4): qty 1

## 2012-12-18 MED ORDER — FERROUS SULFATE 325 (65 FE) MG PO TABS
325.0000 mg | ORAL_TABLET | Freq: Every day | ORAL | Status: DC
  Administered 2012-12-19 – 2012-12-22 (×4): 325 mg via ORAL
  Filled 2012-12-18 (×5): qty 1

## 2012-12-18 MED ORDER — LEVOTHYROXINE SODIUM 50 MCG PO TABS
50.0000 ug | ORAL_TABLET | Freq: Every day | ORAL | Status: DC
  Administered 2012-12-19 – 2012-12-22 (×4): 50 ug via ORAL
  Filled 2012-12-18 (×5): qty 1

## 2012-12-18 MED ORDER — SERTRALINE HCL 50 MG PO TABS
50.0000 mg | ORAL_TABLET | Freq: Every evening | ORAL | Status: DC
  Administered 2012-12-18 – 2012-12-21 (×4): 50 mg via ORAL
  Filled 2012-12-18 (×5): qty 1

## 2012-12-18 MED ORDER — PROMETHAZINE HCL 25 MG PO TABS: 25.0000 mg | ORAL_TABLET | Freq: Four times a day (QID) | ORAL | Status: DC | PRN

## 2012-12-18 MED ORDER — SODIUM CHLORIDE 0.9 % IV SOLN
INTRAVENOUS | Status: DC
  Administered 2012-12-18 – 2012-12-21 (×4): via INTRAVENOUS

## 2012-12-18 MED ORDER — OMEGA-3-ACID ETHYL ESTERS 1 G PO CAPS
1.0000 g | ORAL_CAPSULE | Freq: Two times a day (BID) | ORAL | Status: DC
  Administered 2012-12-18 – 2012-12-22 (×6): 1 g via ORAL
  Filled 2012-12-18 (×9): qty 1

## 2012-12-18 MED ORDER — ALUM & MAG HYDROXIDE-SIMETH 200-200-20 MG/5ML PO SUSP: 30.0000 mL | Freq: Four times a day (QID) | ORAL | Status: DC | PRN

## 2012-12-18 MED ORDER — ASPIRIN EC 81 MG PO TBEC
81.0000 mg | DELAYED_RELEASE_TABLET | Freq: Every day | ORAL | Status: DC
  Administered 2012-12-19 – 2012-12-22 (×4): 81 mg via ORAL
  Filled 2012-12-18 (×5): qty 1

## 2012-12-18 MED ORDER — SACCHAROMYCES BOULARDII 250 MG PO CAPS
250.0000 mg | ORAL_CAPSULE | Freq: Two times a day (BID) | ORAL | Status: DC
  Administered 2012-12-18 – 2012-12-22 (×8): 250 mg via ORAL
  Filled 2012-12-18 (×9): qty 1

## 2012-12-18 MED ORDER — VITAMIN C 500 MG PO TABS
500.0000 mg | ORAL_TABLET | Freq: Every day | ORAL | Status: DC
  Administered 2012-12-19 – 2012-12-22 (×4): 500 mg via ORAL
  Filled 2012-12-18 (×4): qty 1

## 2012-12-18 MED ORDER — METOPROLOL TARTRATE 50 MG PO TABS
50.0000 mg | ORAL_TABLET | Freq: Two times a day (BID) | ORAL | Status: DC
  Administered 2012-12-18 – 2012-12-22 (×8): 50 mg via ORAL
  Filled 2012-12-18 (×9): qty 1

## 2012-12-18 MED ORDER — IOHEXOL 300 MG/ML  SOLN
50.0000 mL | Freq: Once | INTRAMUSCULAR | Status: AC | PRN
  Administered 2012-12-18: 50 mL via ORAL

## 2012-12-18 MED ORDER — SODIUM CHLORIDE 0.9 % IV SOLN
3.0000 g | Freq: Once | INTRAVENOUS | Status: AC
  Administered 2012-12-18: 3 g via INTRAVENOUS
  Filled 2012-12-18: qty 3

## 2012-12-18 MED ORDER — SODIUM CHLORIDE 0.9 % IV BOLUS (SEPSIS)
1000.0000 mL | Freq: Once | INTRAVENOUS | Status: AC
  Administered 2012-12-18: 1000 mL via INTRAVENOUS

## 2012-12-18 MED ORDER — BACLOFEN 10 MG PO TABS
10.0000 mg | ORAL_TABLET | Freq: Every day | ORAL | Status: DC
  Filled 2012-12-18 (×5): qty 1

## 2012-12-18 MED ORDER — PANTOPRAZOLE SODIUM 40 MG PO TBEC
80.0000 mg | DELAYED_RELEASE_TABLET | Freq: Every day | ORAL | Status: DC
  Administered 2012-12-19 – 2012-12-22 (×4): 80 mg via ORAL
  Filled 2012-12-18 (×5): qty 2

## 2012-12-18 MED ORDER — FOLIC ACID-VIT B6-VIT B12 2.5-25-1 MG PO TABS
1.0000 | ORAL_TABLET | Freq: Every day | ORAL | Status: DC
  Administered 2012-12-19 – 2012-12-22 (×4): 1 via ORAL
  Filled 2012-12-18 (×4): qty 1

## 2012-12-18 MED ORDER — AMLODIPINE BESYLATE 10 MG PO TABS
10.0000 mg | ORAL_TABLET | Freq: Every day | ORAL | Status: DC
  Administered 2012-12-19 – 2012-12-22 (×4): 10 mg via ORAL
  Filled 2012-12-18 (×4): qty 1

## 2012-12-18 NOTE — ED Notes (Signed)
Patient transported to X-ray 

## 2012-12-18 NOTE — ED Notes (Signed)
Per EMS patient from [facility].Patient had dysuria last night, complaining of pain upon urination. She has a hx of UTI. Febrile this morning at 101.3 PO. Patient is alert and oriented X4. Nursing home gave phenergan 25mg  PO and 650mg  tylenol rectally at 630.

## 2012-12-18 NOTE — Progress Notes (Signed)
ANTIBIOTIC CONSULT NOTE - INITIAL  Pharmacy Consult for zosyn Indication: fever, unknown source  Allergies  Allergen Reactions  . Codeine     sick  . Morphine And Related Other (See Comments)    unknown  . Percocet (Oxycodone-Acetaminophen)     unknown  . Plavix (Clopidogrel Bisulfate) Other (See Comments)    unknown  . Sulfur Other (See Comments)    unknown    Patient Measurements:     Vital Signs: Temp: 98.7 F (37.1 C) (06/21 1908) Temp src: Oral (06/21 1908) BP: 147/69 mmHg (06/21 1908) Pulse Rate: 95 (06/21 1908) Intake/Output from previous day:   Intake/Output from this shift:    Labs:  Recent Labs  12/18/12 1115  WBC 21.1*  HGB 11.9*  PLT 159  CREATININE 1.32*   The CrCl is unknown because both a height and weight (above a minimum accepted value) are required for this calculation. No results found for this basename: VANCOTROUGH, VANCOPEAK, VANCORANDOM, GENTTROUGH, GENTPEAK, GENTRANDOM, TOBRATROUGH, TOBRAPEAK, TOBRARND, AMIKACINPEAK, AMIKACINTROU, AMIKACIN,  in the last 72 hours   Microbiology: No results found for this or any previous visit (from the past 720 hour(s)).  Medical History: Past Medical History  Diagnosis Date  . SOB (shortness of breath)   . MI (myocardial infarction)   . Poor appetite   . Arthritis   . Fatigue   . Right ear pain   . Renal insufficiency   . RA (rheumatoid arthritis)   . PUD (peptic ulcer disease)   . GI bleed 2005  . GERD (gastroesophageal reflux disease)   . Hiatal hernia   . Hypothyroidism   . Osteoporosis   . Depression   . Anxiety   . Coronary artery disease   . CVA (cerebral vascular accident)   . TIA (transient ischemic attack)   . Fall   . DVT of lower extremity (deep venous thrombosis)     Medications:  Anti-infectives   Start     Dose/Rate Route Frequency Ordered Stop   12/18/12 2000  piperacillin-tazobactam (ZOSYN) IVPB 3.375 g     3.375 g 12.5 mL/hr over 240 Minutes Intravenous Every 8  hours 12/18/12 1918     12/18/12 1630  Ampicillin-Sulbactam (UNASYN) 3 g in sodium chloride 0.9 % 100 mL IVPB     3 g 100 mL/hr over 60 Minutes Intravenous  Once 12/18/12 1622 12/18/12 1827     Assessment: 82 yof admitted with fever and dysuria. PMH of RA and back hardware. Zosyn per pharmacy ordered  Scr 1.32, CrCl ~38 ml/min (using wt 70kg from 06/2012)  WBC wnl, TM 100.1 (reportedly 101.3 @ SNF)  Blood and urine cx ordered  Goal of Therapy:  Appropriate dose of Zosyn  Plan:  Zosyn 3.375g IV q8h, each dose over 4 hours Follow labs and cultures Adjust dose as necessary  Gwen Her PharmD  [phone] 12/18/2012 7:24 PM

## 2012-12-18 NOTE — ED Notes (Signed)
Dr. Rhunette Croft is aware of the critical value CG4 I - stat result.

## 2012-12-18 NOTE — ED Provider Notes (Signed)
History     CSN: [id number]  Arrival date & time 12/18/12  1027   First MD Initiated Contact with Patient 12/18/12 1042      Chief Complaint  Patient presents with  . Fever  . Dysuria    (Consider location/radiation/quality/duration/timing/severity/associated sxs/prior treatment) HPI Comments: 79 y.o. female with Hx of RA on prednisone, secondary adrenal insufficiency, PUD, DVT off of Xarelto, HTN, presents to ER with cc of fever, dysuria. Pt is a resident at [facility], and they reports a fever of 101.3. Pt states that she has some burning with urination, but denies any polyutia or hematuria. There is no n/v/f/c/chest pain/dib, headaches, rash, cough.  Patient is a 79 y.o. female presenting with fever and dysuria. The history is provided by the patient and medical records.  Fever Associated symptoms: dysuria   Associated symptoms: no chest pain, no confusion, no cough, no diarrhea, no nausea and no vomiting   Dysuria Associated symptoms: fever   Associated symptoms: no abdominal pain, no nausea and no vomiting     Past Medical History  Diagnosis Date  . SOB (shortness of breath)   . MI (myocardial infarction)   . Poor appetite   . Arthritis   . Fatigue   . Right ear pain   . Renal insufficiency   . RA (rheumatoid arthritis)   . PUD (peptic ulcer disease)   . GI bleed 2005  . GERD (gastroesophageal reflux disease)   . Hiatal hernia   . Hypothyroidism   . Osteoporosis   . Depression   . Anxiety   . Coronary artery disease   . CVA (cerebral vascular accident)   . TIA (transient ischemic attack)   . Fall   . DVT of lower extremity (deep venous thrombosis)     Past Surgical History  Procedure Laterality Date  . Nstemi  06/2010  . Spinal fusion surgery    . Knee surgery    . Cardiac catheterization      SHOWED RUPTURE PLAQUE IN THE LAD. THE LAD IS NONOBSTRUCTIVE WITH ONLY 30-40% STENOSIS    Family History  Problem Relation Age of Onset  . Kidney disease  Mother   . Kidney disease Brother     History  Substance Use Topics  . Smoking status: Never Smoker   . Smokeless tobacco: Never Used  . Alcohol Use: No    OB History   Grav Para Term Preterm Abortions TAB SAB Ect Mult Living                  Review of Systems  Constitutional: Positive for fever. Negative for activity change.  HENT: Negative for facial swelling and neck pain.   Respiratory: Negative for cough, shortness of breath and wheezing.   Cardiovascular: Negative for chest pain.  Gastrointestinal: Negative for nausea, vomiting, abdominal pain, diarrhea, constipation, blood in stool and abdominal distention.  Genitourinary: Positive for dysuria. Negative for hematuria and difficulty urinating.  Skin: Negative for color change.  Neurological: Negative for speech difficulty.  Hematological: Does not bruise/bleed easily.  Psychiatric/Behavioral: Negative for confusion.    Allergies  Codeine; Morphine and related; Percocet; Plavix; and Sulfur  Home Medications   Current Outpatient Rx  Name  Route  Sig  Dispense  Refill  . acetaminophen (TYLENOL) 650 MG suppository   Rectal   Place 650 mg rectally every 4 (four) hours as needed. Pain         . amLODipine (NORVASC) 10 MG tablet   Oral  Take 1 tablet (10 mg total) by mouth daily.   30 tablet   0   . Ascorbic Acid (VITAMIN C PO)   Oral   Take 500 mg by mouth daily.          Marland Kitchen aspirin 81 MG chewable tablet   Oral   Chew 81 mg by mouth daily.         . cephALEXin (KEFLEX) 500 MG capsule   Oral   Take 1 capsule (500 mg total) by mouth 2 (two) times daily.   12 capsule   0   . diazepam (VALIUM) 5 MG tablet   Oral   Take 1 tablet (5 mg total) by mouth at bedtime.   30 tablet   0   . esomeprazole (NEXIUM) 40 MG capsule   Oral   Take 40 mg by mouth daily before breakfast.          . ferrous sulfate 325 (65 FE) MG tablet   Oral   Take 325 mg by mouth daily with breakfast.         . folic  acid (FOLVITE) 1 MG tablet   Oral   Take 1 mg by mouth daily.          Marland Kitchen HYDROcodone-acetaminophen (VICODIN) 5-500 MG per tablet   Oral   Take 1 tablet by mouth at bedtime. 1 tablet every 6 hrs as needed for pain         . levothyroxine (SYNTHROID, LEVOTHROID) 50 MCG tablet   Oral   Take 50 mcg by mouth daily.          Marland Kitchen LORazepam (ATIVAN) 0.5 MG tablet      Take 1 tablet every night at bedtime as needed for anxiety   30 tablet   5   . metoprolol (LOPRESSOR) 50 MG tablet   Oral   Take 50 mg by mouth 2 (two) times daily.          . miconazole (MICOTIN) 2 % cream   Topical   Apply 1 application topically 2 (two) times daily. Buttocks and Groin until healed         . Multiple Vitamin (MULTIVITAMIN) tablet   Oral   Take 1 tablet by mouth daily.           . Omega-3 Fatty Acids (FISH OIL) 1200 MG CAPS   Oral   Take 1,200 mg by mouth daily.         . predniSONE (DELTASONE) 10 MG tablet   Oral   Take 10 mg by mouth daily.         . promethazine (PHENERGAN) 25 MG tablet   Oral   Take 25 mg by mouth every 6 (six) hours as needed. nausea         . rivaroxaban (XARELTO) 10 MG TABS tablet   Oral   Take 20 mg by mouth every evening.          . rosuvastatin (CRESTOR) 20 MG tablet   Oral   Take 20 mg by mouth every evening.          Marland Kitchen saccharomyces boulardii (FLORASTOR) 250 MG capsule   Oral   Take 1 capsule (250 mg total) by mouth 2 (two) times daily.   60 capsule   0   . sertraline (ZOLOFT) 50 MG tablet   Oral   Take 50 mg by mouth every evening.            There were  no vitals taken for this visit.  Physical Exam  Nursing note and vitals reviewed. Constitutional: She is oriented to person, place, and time. She appears well-developed and well-nourished.  HENT:  Head: Normocephalic and atraumatic.  Eyes: EOM are normal. Pupils are equal, round, and reactive to light.  Neck: Neck supple.  Cardiovascular: Normal rate and regular rhythm.    Murmur heard. Pulmonary/Chest: Effort normal. No respiratory distress.  Abdominal: Soft. She exhibits no distension. There is tenderness. There is no rebound and no guarding.  Lower quadrant diffuse tenderness, worst over the LLQ  Musculoskeletal:  RLE unilateral swelling  Neurological: She is alert and oriented to person, place, and time.  Skin: Skin is warm and dry.    ED Course  Procedures (including critical care time)  Labs Reviewed - No data to display No results found.   No diagnosis found.    MDM  Pt comes in with cc of fever and dysuria.  DDx: Sepsis syndrome ACS syndrome Infection - pneumonia/UTI/Cellulitis DVT/PE Dehydration Electrolyte abnormality Tox syndrome  Pt was febrile at the nursing home, temp of 101.3, and is tachycardic.  Has intermittent dysuria, and has abd tenderness in the lower quadrants - only discernible during exam - as she denies abd pain on ROS. Also has hx of DVT, LEFT side, with currently right sided unilateral swelling.  Plan is to get US DVT for the leg swelling. UA to evaluate for UTI. If [place] is clean - will still treat as an infection, given symptoms, age and fevers. Has abd pain - will likely need imaging to ensure there is no infection. 2 SIRs criteria - so lactate ordered, hydration started.  Derwood Kaplan, MD 12/20/12 [phone]

## 2012-12-18 NOTE — ED Notes (Signed)
Dr

## 2012-12-18 NOTE — Progress Notes (Signed)
VASCULAR LAB PRELIMINARY  PRELIMINARY  PRELIMINARY  PRELIMINARY  Right lower extremity venous Doppler completed.    Preliminary report:  There is no DVT or SVT noted in the right lower extremity.  [name], RVT 12/18/2012, 1:13 PM

## 2012-12-18 NOTE — ED Notes (Signed)
Patient's daughter made aware of patient admission

## 2012-12-18 NOTE — ED Notes (Signed)
Attempted to call report

## 2012-12-18 NOTE — ED Notes (Signed)
Patient has slight discomfort when pressing on lower abdomen

## 2012-12-18 NOTE — H&P (Signed)
Physician Admission History and Physical     PCP:   Oneal Grout, MD   Chief Complaint:  Presents from [facility] for fevers and lower quandrant abdominal pain   HPI: [name] is an 79 y.o. female w/ PMH of CAD, CVA, HLD, HTN, RA and DDD who presents from [facility] with the chief complaint of fevers up to 102 and some abdominal pain w/ dysuria since yesterday. She also states that she developed some upset stomach w/ emesis x 1. This symptom has resolved since this morning. She is on prednisone chronically 2/2 RA at 10mg  daily (increased from 5mg  daily about 6 mo prior). In the ED, CT A/P, CXR and U/A show no clear source of this infection. She has extensive back hardware present on her CXR that shows no obvious signs of infection (this was placed 2/2 scoliosis in the past). She also has a knee replacement in the past. She is afebrile at this time but does have leukocytosis at 21 w/ a left shift, slight elevation of her Cr at 1.3 and a lactate of 2.6. Otherwise unremarkable w/u. The patient was given unasyn for abx coverage in the ED ( considering her abd pain as possible source) and started on fluids. Blood cultures have yet to be drawn. LE U/S was neg on prelim.  She denies any CP, dyspnea, diarrhea, severe abd pain, cough, head congestion, chills, sweats, back pain, skin ulcer/lacerations.  She only endorsed having what sounds like a GI illness starting 24 hours prior w/ emesis and presents from a SNF at [facility]   Review of Systems:  Neg except as noted above   Past Medical History: Past Medical History  Diagnosis Date  . SOB (shortness of breath)   . MI (myocardial infarction)   . Poor appetite   . Arthritis   . Fatigue   . Right ear pain   . Renal insufficiency   . RA (rheumatoid arthritis)   . PUD (peptic ulcer disease)   . GI bleed 2005  . GERD (gastroesophageal reflux disease)   . Hiatal hernia   . Hypothyroidism   . Osteoporosis   . Depression   .  Anxiety   . Coronary artery disease   . CVA (cerebral vascular accident)   . TIA (transient ischemic attack)   . Fall   . DVT of lower extremity (deep venous thrombosis)    Past Surgical History  Procedure Laterality Date  . Nstemi  06/2010  . Spinal fusion surgery    . Knee surgery    . Cardiac catheterization      SHOWED RUPTURE PLAQUE IN THE LAD. THE LAD IS NONOBSTRUCTIVE WITH ONLY 30-40% STENOSIS    Medications: Prior to Admission medications   Medication Sig Start Date End Date Taking? Authorizing Provider  acetaminophen (TYLENOL) 650 MG suppository Place 650 mg rectally every 4 (four) hours as needed. Pain   Yes Historical Provider, MD  amLODipine (NORVASC) 10 MG tablet Take 1 tablet (10 mg total) by mouth daily. 07/08/12  Yes Penny Pia, MD  Ascorbic Acid (VITAMIN C PO) Take 500 mg by mouth daily.    Yes Historical Provider, MD  aspirin 81 MG chewable tablet Chew 81 mg by mouth daily.   Yes Historical Provider, MD  baclofen (LIORESAL) 10 MG tablet Take 10 mg by mouth 3 (three) times daily - between meals and at bedtime.   Yes Historical Provider, MD  esomeprazole (NEXIUM) 40 MG capsule Take 40 mg by mouth daily before  breakfast.    Yes Historical Provider, MD  ferrous sulfate 325 (65 FE) MG tablet Take 325 mg by mouth daily with breakfast.   Yes Historical Provider, MD  Folic Acid-Vit B6-Vit B12 (FOLBEE) 2.5-25-1 MG TABS Take 1 tablet by mouth daily.   Yes Historical Provider, MD  HYDROcodone-acetaminophen (VICODIN) 5-500 MG per tablet Take 1 tablet by mouth at bedtime. 1 tablet every 6 hrs as needed for pain 06/04/12  Yes Laveda Norman, MD  levothyroxine (SYNTHROID, LEVOTHROID) 50 MCG tablet Take 50 mcg by mouth daily.    Yes Historical Provider, MD  LORazepam (ATIVAN) 0.5 MG tablet Take 1 tablet every night at bedtime as needed for anxiety 10/25/12  Yes Tiffany L Reed, DO  metoprolol (LOPRESSOR) 50 MG tablet Take 50 mg by mouth 2 (two) times daily.    Yes Historical Provider, MD   Multiple Vitamin (MULTIVITAMIN) tablet Take 1 tablet by mouth daily.     Yes Historical Provider, MD  NON FORMULARY Take 1 tablet by mouth daily. FOLIC ACID 1 MG/VITAMIN B 9   Yes Historical Provider, MD  Omega-3 Fatty Acids (FISH OIL) 1200 MG CAPS Take 1,200 mg by mouth daily.   Yes Historical Provider, MD  predniSONE (DELTASONE) 10 MG tablet Take 10 mg by mouth daily.   Yes Historical Provider, MD  promethazine (PHENERGAN) 25 MG tablet Take 25 mg by mouth every 6 (six) hours as needed. nausea   Yes Historical Provider, MD  rosuvastatin (CRESTOR) 20 MG tablet Take 20 mg by mouth every evening.    Yes Historical Provider, MD  saccharomyces boulardii (FLORASTOR) 250 MG capsule Take 1 capsule (250 mg total) by mouth 2 (two) times daily. 06/04/12  Yes Laveda Norman, MD  sertraline (ZOLOFT) 50 MG tablet Take 50 mg by mouth every evening.    Yes Historical Provider, MD    Allergies:   Allergies  Allergen Reactions  . Codeine     sick  . Morphine And Related Other (See Comments)    unknown  . Percocet (Oxycodone-Acetaminophen)     unknown  . Plavix (Clopidogrel Bisulfate) Other (See Comments)    unknown  . Sulfur Other (See Comments)    unknown    Social History:  reports that she has never smoked. She has never used smokeless tobacco. She reports that she does not drink alcohol or use illicit drugs.  Family History: Family History  Problem Relation Age of Onset  . Kidney disease Mother   . Kidney disease Brother     Physical Exam: Filed Vitals:   12/18/12 1400 12/18/12 1430 12/18/12 1500 12/18/12 1524  BP: 132/60 132/66 147/70 132/70  Pulse: 91 95 94 88  Temp:      TempSrc:      Resp:      SpO2: 97% 98% 98% 97%   Gen: WF in NAD resting comfortably. AAOx4.  HEENT: dry mucous membranes. PERRL, EOMI. Trachea midline. Neck supple. No LAD.  Lungs: CATB. No rales,crackles, wheezes  Cardio: RRR, no MRG, 2+ DP pulses B/L w/ good cap refill.  Abd: Soft, nontender, nondistended, no  masses, no HSM, no lesions  MSK: 5/5 strength in UE/LE w/ normal reflexes throughout  Neuro: AAOx4. Normal mood. Normal reflexes.  Skin: normal on my thorough exam. No sacral ulcers. No skin lacerations.     Labs on Admission:   Recent Labs  12/18/12 1115  NA 139  K 3.6  CL 100  CO2 29  GLUCOSE 114*  BUN 26*  CREATININE 1.32*  CALCIUM 10.0   No results found for this basename: AST, ALT, ALKPHOS, BILITOT, PROT, ALBUMIN,  in the last 72 hours No results found for this basename: LIPASE, AMYLASE,  in the last 72 hours  Recent Labs  12/18/12 1115  WBC 21.1*  NEUTROABS 17.9*  HGB 11.9*  HCT 36.7  MCV 86.2  PLT 159    Recent Labs  12/18/12 1115  TROPONINI <0.30   Lab Results  Component Value Date   INR 1.65* 04/05/2012   INR 1.78* 04/04/2012   INR 2.98* 04/03/2012   No results found for this basename: TSH, T4TOTAL, FREET3, T3FREE, THYROIDAB,  in the last 72 hours No results found for this basename: VITAMINB12, FOLATE, FERRITIN, TIBC, IRON, RETICCTPCT,  in the last 72 hours  Radiological Exams on Admission: Ct Abdomen Pelvis Wo Contrast  12/18/2012   *RADIOLOGY REPORT*  Clinical Data: lower quadrant pain.  Leukocytosis.  CT ABDOMEN AND PELVIS WITHOUT CONTRAST  Technique:  Multidetector CT imaging of the abdomen and pelvis was performed following the standard protocol without intravenous contrast.  Comparison:  03/16/2012  Findings: Pleural thickening is noted in the medial right lung base.  There is mild subsegmental atelectasis within both lower lobes.  No focal liver abnormalities identified.  The gallbladder appears normal.  No biliary dilatation.  Normal appearance of the pancreas.  Calcified granuloma noted within the spleen.  The adrenal glands are both normal.  Bilateral renal cortical thinning is identified.  No hydronephrosis noted.  No nephrolithiasis.  The urinary bladder appears normal.  Previous hysterectomy.  Normal caliber of the abdominal aorta.  There is no  aneurysm.  No enlarged upper abdominal lymph nodes.  There is no pelvic or inguinal adenopathy noted.  Large hiatal hernia noted.  The small bowel loops are within normal limits.  Scattered colonic diverticula noted.  No evidence for acute colonic inflammation.  There is no free fluid or abnormal fluid collections identified.  Review of the visualized bony structures is remarkable for osteopenia.  The patient is status post hardware fixation of the thoracic and lumbar spine. Again noted are compression deformities involving the T10 and T12 vertebra. Chronic right superior and inferior pubic rami fractures are again identified.  IMPRESSION:  1.  No acute findings identified within the abdomen or pelvis. 2.  Large hiatal hernia.   Original Report Authenticated By: Signa Kell, M.D.   Dg Chest 2 View  12/18/2012   *RADIOLOGY REPORT*  Clinical Data: chest pain  CHEST - 2 VIEW  Comparison: 06/01/2012  Findings: Mild to moderate cardiac enlargement.  No effusion or edema.  The lung volumes are low.  Scar or plate- like atelectasis is noted in the left base.  No airspace consolidation.  Previous hardware fixation of the thoracic and lumbar spine.  Compression deformity within the lower thoracic spine appears similar to previous exam.  IMPRESSION: 1.  Low lung volumes. 2.  Scar versus plate-like atelectasis present in the left base.   Original Report Authenticated By: Signa Kell, M.D.   Orders placed during the hospital encounter of 07/06/12  . ED EKG  . ED EKG  . EKG 12-LEAD  . EKG 12-LEAD  . EKG    Assessment/Plan Fever and leukocytosis, unclear source - While my main suspicion at this point is a viral GI illness, we need to ensure no other source of bacterial infection exists, given she has extensive hardware that would be at risk of seeding infection in one is present as well  as fact she is immunosuppressed on prednisone chronically. Unfortunately, blood cultures were not obtained prior the initial  unasyn dose in the ED, but will obtain them to ensure no persistent infection is present, granted w/ much less sensitivity 2/2 timing of cultures . Will place her on broad spec coverage w/ zosyn for intial 48 hours while cultures are pending. No need for vanc given normal skin exam decreasing likelihood of Gram Pos infection. Place on IVF at 100cc/hr for first night to rehydrate in setting of possible infection, given elevated lactate w/ Cr bump on admission. Tylenol prn fever. Phenergan prn nausea. Cont home PPI to help w/ gastritis. Will also place on incentive spirometry to ensure atelectasis not complicating picture of fever. Things that have been ruled out are PNA, UTI, abd source, DVT w/ doppler of RLE.   AKI, mild - Cr slightly elevated from baseline. Hydrate overnight HTN - cont home meds , given normotensive currently RA - cont home prednisone 10mg  daily. Not initiating stress dose steroids at this time given her non-septic appearance GERD - cont home meds  Hypothyroid - cont home meds  HLD - cont home meds  Depression - cont home meds  Ethics : daughter can be reached at [phone]. No portable DNR present w/ patient, so assumed full code at this point  PPx : lovenox  Dispo : if cultures remain negative and pt asymptomatic in 2-3 days, will consider discharge   Lavonta Tillis 12/18/2012, 5:04 PM

## 2012-12-18 NOTE — ED Notes (Signed)
Bed:WA10<BR> Expected date:<BR> Expected time:<BR> Means of arrival:<BR> Comments:<BR> EMS

## 2012-12-19 DIAGNOSIS — A419 Sepsis, unspecified organism: Secondary | ICD-10-CM

## 2012-12-19 DIAGNOSIS — D72829 Elevated white blood cell count, unspecified: Secondary | ICD-10-CM

## 2012-12-19 DIAGNOSIS — M069 Rheumatoid arthritis, unspecified: Secondary | ICD-10-CM | POA: Diagnosis not present

## 2012-12-19 HISTORY — DX: Sepsis, unspecified organism: A41.9

## 2012-12-19 LAB — URINE CULTURE: Culture: NO GROWTH

## 2012-12-19 LAB — CBC
MCH: 28.6 pg (ref 26.0–34.0)
MCHC: 32.6 g/dL (ref 30.0–36.0)
MCV: 87.7 fL (ref 78.0–100.0)
Platelets: 142 10*3/uL — ABNORMAL LOW (ref 150–400)
RBC: 3.67 MIL/uL — ABNORMAL LOW (ref 3.87–5.11)
RDW: 15.8 % — ABNORMAL HIGH (ref 11.5–15.5)

## 2012-12-19 LAB — BASIC METABOLIC PANEL
BUN: 17 mg/dL (ref 6–23)
CO2: 26 mEq/L (ref 19–32)
Calcium: 8.6 mg/dL (ref 8.4–10.5)
Creatinine, Ser: 1.19 mg/dL — ABNORMAL HIGH (ref 0.50–1.10)
Glucose, Bld: 90 mg/dL (ref 70–99)

## 2012-12-19 NOTE — Evaluation (Signed)
Physical Therapy Evaluation Patient Details Name: [name] MRN: [id number] DOB: August 10, 1929 Today's Date: 12/19/2012 Time: 4098-1191 PT Time Calculation (min): 35 min  PT Assessment / Plan / Recommendation Clinical Impression  Pt presents with fever from [facility].  Note history of CAD, CVA, HTN, RA and scoliosis with hardware placement.  Tolerated several reps of rolling in bed for self care and also 2 reps of transfers to 3in1 and to recliner, however requires mod to max assist for mobility.  Pt will benefit from skilled PT in acute venue to address deficits.  PT recommends returning to SNF at D/C to maximize pts safety and function.     PT Assessment  Patient needs continued PT services    Follow Up Recommendations  SNF;Supervision/Assistance - 24 hour    Does the patient have the potential to tolerate intense rehabilitation      Barriers to Discharge None      Equipment Recommendations  None recommended by PT    Recommendations for Other Services OT consult   Frequency Min 3X/week    Precautions / Restrictions Precautions Precautions: Fall Restrictions Weight Bearing Restrictions: No   Pertinent Vitals/Pain No pain       Mobility  Bed Mobility Bed Mobility: Rolling Right;Rolling Left;Supine to Sit Rolling Right: 4: Min assist;With rail Rolling Left: 4: Min assist;With rail Supine to Sit: 3: Mod assist Details for Bed Mobility Assistance: Pt able to roll mostly with min/guard, however did require min assist to fully get on R or L side for performing self care.  Performed several times for self care.  Pt able to utilize hand rails when sitting up, but does require some assist for trunk.   Transfers Transfers: Sit to Stand;Stand to [employer] Transfers Sit to Stand: 2: Max assist;With upper extremity assist;From bed Stand to Sit: 3: Mod assist;With upper extremity assist;With armrests;To chair/3-in-1 Stand Pivot Transfers: 2: Max assist Details for  Transfer Assistance: Performed x 2 in order to use 3in1 by bedside and then get to recliner.  Requires max assist at worst, however on second attempt did much better with mod assist.  Performed stand pivot with max assist with cues for technique.  Did not utilize RW, however feel that she may be able to use in future with +2 assist for safety.  Ambulation/Gait Ambulation/Gait Assistance: Not tested (comment)    Exercises     PT Diagnosis: Difficulty walking;Generalized weakness  PT Problem List: Decreased strength;Decreased range of motion;Decreased activity tolerance;Decreased balance;Decreased mobility;Decreased coordination;Decreased knowledge of use of DME;Decreased safety awareness;Decreased knowledge of precautions PT Treatment Interventions: DME instruction;Gait training;Functional mobility training;Therapeutic activities;Therapeutic exercise;Balance training;Patient/family education   PT Goals Acute Rehab PT Goals PT Goal Formulation: With patient Time For Goal Achievement: 12/26/12 Potential to Achieve Goals: Good Pt will go Supine/Side to Sit: with min assist PT Goal: Supine/Side to Sit - Progress: Goal set today Pt will go Sit to Supine/Side: with min assist PT Goal: Sit to Supine/Side - Progress: Goal set today Pt will go Sit to Stand: with min assist PT Goal: Sit to Stand - Progress: Goal set today Pt will go Stand to Sit: with min assist PT Goal: Stand to Sit - Progress: Goal set today Pt will Transfer Bed to Chair/Chair to Bed: with min assist PT Transfer Goal: Bed to Chair/Chair to Bed - Progress: Goal set today Pt will Ambulate: 1 - 15 feet;with mod assist;with least restrictive assistive device PT Goal: Ambulate - Progress: Goal set today  Visit Information  Last PT Received On: 12/19/12 Assistance Needed: +2 (+2 if going to amb at all)    Subjective Data  Subjective: I'll try Patient Stated Goal: to return to [facility] place   Prior Functioning  Home  Living Available Help at Discharge: Skilled Nursing Facility Type of Home: Skilled Nursing Facility Bathroom Shower/Tub: Walk-in shower Bathroom Toilet: Handicapped height Home Adaptive Equipment: Wheelchair - manual;Walker - rolling Prior Function Level of Independence: Needs assistance Needs Assistance: Bathing;Dressing;Toileting;Transfers Able to Take Stairs?: No Driving: No Vocation: Retired Comments: Pt was only performing stand pivot transfers to w/c, toilet, chair with assist.  She states that sometimes she would push w/c and sometimes she would get asssist.  Communication Communication: No difficulties    Cognition  Cognition Arousal/Alertness: Awake/alert Behavior During Therapy: WFL for tasks assessed/performed Overall Cognitive Status: Within Functional Limits for tasks assessed    Extremity/Trunk Assessment Right Lower Extremity Assessment RLE ROM/Strength/Tone: Deficits RLE ROM/Strength/Tone Deficits: Pt with generalized weakness, grossly 3/5 per functional observation.  RLE Sensation: WFL - Light Touch Left Lower Extremity Assessment LLE ROM/Strength/Tone: Deficits LLE ROM/Strength/Tone Deficits: Pt with generalized weakness, grossly 3/5 per functional observation.  LLE Sensation: WFL - Light Touch Trunk Assessment Trunk Assessment: Kyphotic;Other exceptions Trunk Exceptions: hx of scoliosis with hardware placement in spine   Balance    End of Session PT - End of Session Activity Tolerance: Patient tolerated treatment well Patient left: in chair;with call bell/phone within reach Nurse Communication: Mobility status  GP     Vista Deck 12/19/2012, 11:02 AM

## 2012-12-19 NOTE — Progress Notes (Signed)
[facility] PROGRESS NOTE Interim History: 79 y.o. female w/ PMH of CAD, CVA, HLD, HTN, RA on steroids and DDD who presents from [facility] with the chief complaint of fevers up to 102 and some abdominal pain w/ dysuria since yesterday. She also states that she developed some upset stomach w/ emesis x 1. This symptom has resolved since this morning.    Assessment/Plan: Fever/Sepsis: - afebrile since yesterday. Lactic on admission 2.6. Leukocytosis improving. - BC and UC pending, drawn after antibiotics given. - CT Abd 6.22.2014, no acute finding. - CXR 6.21.2014 low lung volume , ? ATX on left. - Lower extremity doppler 6.21.2014, negative for DVT. - zosyn 6.21.2014.  AKI: - improving with IV fluids.  Hypertension -cont home meds , given normotensive currently  RA (rheumatoid arthritis) - cont steroids.  GERD (gastroesophageal reflux disease) - PPI.  Hypothyroidism - cont synthroid.    Code Status: full Family Communication: daughter can be reached at [phone] Disposition Plan: inpatiet   Consultants:  none  Procedures:  none  Antibiotics:  Unasyn on dose 6.21.2014  Zosyn 6.21.2014  HPI/Subjective: No complains, want to try something to eat today.  Objective: Filed Vitals:   12/18/12 1851 12/18/12 1908 12/19/12 0452 12/19/12 0511  BP: 146/61 147/69 144/73   Pulse: 97 95 88   Temp:  98.7 F (37.1 C) 99.5 F (37.5 C)   TempSrc:  Oral Oral   Resp: 18 16 16    Height:    5\' 5"  (1.651 m)  SpO2: 99% 100% 94%     Intake/Output Summary (Last 24 hours) at 12/19/12 0748 Last data filed at 12/19/12 4540  Gross per 24 hour  Intake    664 ml  Output      0 ml  Net    664 ml   Filed Weights    Exam:  General: Alert, awake, oriented x3, in no acute distress.  HEENT: No bruits, no goiter.  Heart: Regular rate and rhythm, without murmurs, rubs, gallops.  Lungs: Good air movement, clear to auscultation Abdomen: Soft, nontender, nondistended,  positive bowel sounds.  Neuro: Grossly intact, nonfocal.   Data Reviewed: Basic Metabolic Panel:  Recent Labs Lab 12/18/12 1115 12/19/12 0458  NA 139 139  K 3.6 3.5  CL 100 105  CO2 29 26  GLUCOSE 114* 90  BUN 26* 17  CREATININE 1.32* 1.19*  CALCIUM 10.0 8.6   Liver Function Tests: No results found for this basename: AST, ALT, ALKPHOS, BILITOT, PROT, ALBUMIN,  in the last 168 hours No results found for this basename: LIPASE, AMYLASE,  in the last 168 hours No results found for this basename: AMMONIA,  in the last 168 hours CBC:  Recent Labs Lab 12/18/12 1115 12/19/12 0458  WBC 21.1* 13.2*  NEUTROABS 17.9*  --   HGB 11.9* 10.5*  HCT 36.7 32.2*  MCV 86.2 87.7  PLT 159 142*   Cardiac Enzymes:  Recent Labs Lab 12/18/12 1115  TROPONINI <0.30   BNP (last 3 results)  Recent Labs  03/17/12 0530 03/18/12 0400  PROBNP 1429.0* 3700.0*   CBG: No results found for this basename: GLUCAP,  in the last 168 hours  No results found for this or any previous visit (from the past 240 hour(s)).   Studies: Ct Abdomen Pelvis Wo Contrast  12/18/2012   *RADIOLOGY REPORT*  Clinical Data: lower quadrant pain.  Leukocytosis.  CT ABDOMEN AND PELVIS WITHOUT CONTRAST  Technique:  Multidetector CT imaging of the abdomen and pelvis was performed following  the standard protocol without intravenous contrast.  Comparison:  03/16/2012  Findings: Pleural thickening is noted in the medial right lung base.  There is mild subsegmental atelectasis within both lower lobes.  No focal liver abnormalities identified.  The gallbladder appears normal.  No biliary dilatation.  Normal appearance of the pancreas.  Calcified granuloma noted within the spleen.  The adrenal glands are both normal.  Bilateral renal cortical thinning is identified.  No hydronephrosis noted.  No nephrolithiasis.  The urinary bladder appears normal.  Previous hysterectomy.  Normal caliber of the abdominal aorta.  There is no  aneurysm.  No enlarged upper abdominal lymph nodes.  There is no pelvic or inguinal adenopathy noted.  Large hiatal hernia noted.  The small bowel loops are within normal limits.  Scattered colonic diverticula noted.  No evidence for acute colonic inflammation.  There is no free fluid or abnormal fluid collections identified.  Review of the visualized bony structures is remarkable for osteopenia.  The patient is status post hardware fixation of the thoracic and lumbar spine. Again noted are compression deformities involving the T10 and T12 vertebra. Chronic right superior and inferior pubic rami fractures are again identified.  IMPRESSION:  1.  No acute findings identified within the abdomen or pelvis. 2.  Large hiatal hernia.   Original Report Authenticated By: Signa Kell, M.D.   Dg Chest 2 View  12/18/2012   *RADIOLOGY REPORT*  Clinical Data: chest pain  CHEST - 2 VIEW  Comparison: 06/01/2012  Findings: Mild to moderate cardiac enlargement.  No effusion or edema.  The lung volumes are low.  Scar or plate- like atelectasis is noted in the left base.  No airspace consolidation.  Previous hardware fixation of the thoracic and lumbar spine.  Compression deformity within the lower thoracic spine appears similar to previous exam.  IMPRESSION: 1.  Low lung volumes. 2.  Scar versus plate-like atelectasis present in the left base.   Original Report Authenticated By: Signa Kell, M.D.    Scheduled Meds: . amLODipine  10 mg Oral Daily  . aspirin EC  81 mg Oral Daily  . atorvastatin  40 mg Oral q1800  . baclofen  10 mg Oral QHS  . enoxaparin (LOVENOX) injection  40 mg Subcutaneous Q24H  . ferrous sulfate  325 mg Oral Q breakfast  . Folic Acid-Vit B6-Vit B12  1 tablet Oral Daily  . levothyroxine  50 mcg Oral QAC breakfast  . metoprolol  50 mg Oral BID  . omega-3 acid ethyl esters  1 g Oral BID  . pantoprazole  80 mg Oral Q1200  . piperacillin-tazobactam (ZOSYN)  IV  3.375 g Intravenous Q8H  . predniSONE   10 mg Oral Daily  . saccharomyces boulardii  250 mg Oral BID  . sertraline  50 mg Oral QPM  . vitamin C  500 mg Oral Daily   Continuous Infusions: . sodium chloride 100 mL/hr at 12/18/12 2034     Marinda Elk  [facility] Pager [phone]. If 8PM-8AM, please contact night-coverage at [email or web address], password [email or web address] 12/19/2012, 7:48 AM  LOS: 1 day

## 2012-12-20 DIAGNOSIS — R109 Unspecified abdominal pain: Secondary | ICD-10-CM

## 2012-12-20 DIAGNOSIS — K5289 Other specified noninfective gastroenteritis and colitis: Secondary | ICD-10-CM

## 2012-12-20 DIAGNOSIS — E2749 Other adrenocortical insufficiency: Secondary | ICD-10-CM

## 2012-12-20 DIAGNOSIS — A419 Sepsis, unspecified organism: Secondary | ICD-10-CM | POA: Diagnosis not present

## 2012-12-20 LAB — CBC
MCV: 86.5 fL (ref 78.0–100.0)
Platelets: 132 10*3/uL — ABNORMAL LOW (ref 150–400)
RDW: 15.3 % (ref 11.5–15.5)
WBC: 8.6 10*3/uL (ref 4.0–10.5)

## 2012-12-20 LAB — BASIC METABOLIC PANEL
CO2: 26 mEq/L (ref 19–32)
Calcium: 8.5 mg/dL (ref 8.4–10.5)
Creatinine, Ser: 1.25 mg/dL — ABNORMAL HIGH (ref 0.50–1.10)
GFR calc non Af Amer: 40 mL/min — ABNORMAL LOW (ref >90)

## 2012-12-20 NOTE — Progress Notes (Signed)
[facility] PROGRESS NOTE  [name] ZOX:[id number] DOB: [birth year] DOA: 12/18/2012 PCP: Oneal Grout, MD  Assessment/Plan: 1. Fever- ? Cause, resolved for now. Urine culture is negative, blood culture is pending. Will order stool for C diff, and also obtain ID consult. Continue with zosyn. 2. Adrenal insufficiency- continue with po prednisone. 3. Hypertension- continue amlodipine, metoprolol. 4. Hypothyroidism- Continue synthroid. 5. Acute Kidney injury-  Cr is at baseline. 6. DVT prophylaxis- Lovenox.  Code Status:  Family Communication: Discussed with patient in detail Disposition Plan: SNF   Consultants:  ID  Procedures:  None  Antibiotics:  Zosyn 6/21>>  HPI/Subjective: Patient seen and examined, admitted with fever, leukocytosis, WBC is now back to normal, she is afebrile. Urine culture is negative. Objective: Filed Vitals:   12/19/12 0944 12/19/12 1331 12/19/12 2100 12/20/12 0500  BP: 145/83 118/64 120/60 124/69  Pulse: 100 82 81 77  Temp:  98.9 F (37.2 C) 98.4 F (36.9 C) 97 F (36.1 C)  TempSrc:  Oral Oral Oral  Resp: 20 16 18 16   Height:      SpO2: 95% 98% 98% 99%    Intake/Output Summary (Last 24 hours) at 12/20/12 1208 Last data filed at 12/20/12 0900  Gross per 24 hour  Intake   2937 ml  Output      0 ml  Net   2937 ml   Filed Weights    Exam:   General: Appear in no acute distress  Cardiovascular: S1S2 RRR  Respiratory: Clear bilaterally  Abdomen: Soft, non tender, no organomegaly  Musculoskeletal: No edema of lower extremities  Data Reviewed: Basic Metabolic Panel:  Recent Labs Lab 12/18/12 1115 12/19/12 0458 12/20/12 0430  NA 139 139 140  K 3.6 3.5 3.5  CL 100 105 107  CO2 29 26 26   GLUCOSE 114* 90 105*  BUN 26* 17 14  CREATININE 1.32* 1.19* 1.25*  CALCIUM 10.0 8.6 8.5   Liver Function Tests: No results found for this basename: AST, ALT, ALKPHOS, BILITOT, PROT, ALBUMIN,  in the last 168 hours No  results found for this basename: LIPASE, AMYLASE,  in the last 168 hours No results found for this basename: AMMONIA,  in the last 168 hours CBC:  Recent Labs Lab 12/18/12 1115 12/19/12 0458 12/20/12 0430  WBC 21.1* 13.2* 8.6  NEUTROABS 17.9*  --   --   HGB 11.9* 10.5* 9.9*  HCT 36.7 32.2* 30.0*  MCV 86.2 87.7 86.5  PLT 159 142* 132*   Cardiac Enzymes:  Recent Labs Lab 12/18/12 1115  TROPONINI <0.30   BNP (last 3 results)  Recent Labs  03/17/12 0530 03/18/12 0400  PROBNP 1429.0* 3700.0*   CBG: No results found for this basename: GLUCAP,  in the last 168 hours  Recent Results (from the past 240 hour(s))  URINE CULTURE     Status: None   Collection Time    12/18/12 12:03 PM      Result Value Range Status   Specimen Description URINE, CATHETERIZED   Final   Special Requests NONE   Final   Culture  Setup Time 12/18/2012 19:36   Final   Colony Count NO GROWTH   Final   Culture NO GROWTH   Final   Report Status 12/19/2012 FINAL   Final  CULTURE, BLOOD (ROUTINE X 2)     Status: None   Collection Time    12/18/12  6:00 PM      Result Value Range Status   Specimen Description BLOOD RIGHT FOREARM  4 ML IN [facility] BOTTLE   Final   Special Requests Immunocompromised   Final   Culture  Setup Time 12/18/2012 23:01   Final   Culture     Final   Value:        BLOOD CULTURE RECEIVED NO GROWTH TO DATE CULTURE WILL BE HELD FOR 5 DAYS BEFORE ISSUING A FINAL NEGATIVE REPORT   Report Status PENDING   Incomplete  CULTURE, BLOOD (ROUTINE X 2)     Status: None   Collection Time    12/18/12  6:10 PM      Result Value Range Status   Specimen Description BLOOD RIGHT HAND  4 ML IN [facility] BOTTLE   Final   Special Requests Immunocompromised ON PREDNISONE 5 MG DAILY   Final   Culture  Setup Time 12/18/2012 23:01   Final   Culture     Final   Value:        BLOOD CULTURE RECEIVED NO GROWTH TO DATE CULTURE WILL BE HELD FOR 5 DAYS BEFORE ISSUING A FINAL NEGATIVE REPORT   Report Status PENDING    Incomplete     Studies: Ct Abdomen Pelvis Wo Contrast  12/18/2012   *RADIOLOGY REPORT*  Clinical Data: lower quadrant pain.  Leukocytosis.  CT ABDOMEN AND PELVIS WITHOUT CONTRAST  Technique:  Multidetector CT imaging of the abdomen and pelvis was performed following the standard protocol without intravenous contrast.  Comparison:  03/16/2012  Findings: Pleural thickening is noted in the medial right lung base.  There is mild subsegmental atelectasis within both lower lobes.  No focal liver abnormalities identified.  The gallbladder appears normal.  No biliary dilatation.  Normal appearance of the pancreas.  Calcified granuloma noted within the spleen.  The adrenal glands are both normal.  Bilateral renal cortical thinning is identified.  No hydronephrosis noted.  No nephrolithiasis.  The urinary bladder appears normal.  Previous hysterectomy.  Normal caliber of the abdominal aorta.  There is no aneurysm.  No enlarged upper abdominal lymph nodes.  There is no pelvic or inguinal adenopathy noted.  Large hiatal hernia noted.  The small bowel loops are within normal limits.  Scattered colonic diverticula noted.  No evidence for acute colonic inflammation.  There is no free fluid or abnormal fluid collections identified.  Review of the visualized bony structures is remarkable for osteopenia.  The patient is status post hardware fixation of the thoracic and lumbar spine. Again noted are compression deformities involving the T10 and T12 vertebra. Chronic right superior and inferior pubic rami fractures are again identified.  IMPRESSION:  1.  No acute findings identified within the abdomen or pelvis. 2.  Large hiatal hernia.   Original Report Authenticated By: Signa Kell, M.D.    Scheduled Meds: . amLODipine  10 mg Oral Daily  . aspirin EC  81 mg Oral Daily  . atorvastatin  40 mg Oral q1800  . baclofen  10 mg Oral QHS  . enoxaparin (LOVENOX) injection  40 mg Subcutaneous Q24H  . ferrous sulfate  325 mg  Oral Q breakfast  . Folic Acid-Vit B6-Vit B12  1 tablet Oral Daily  . levothyroxine  50 mcg Oral QAC breakfast  . metoprolol  50 mg Oral BID  . omega-3 acid ethyl esters  1 g Oral BID  . pantoprazole  80 mg Oral Q1200  . piperacillin-tazobactam (ZOSYN)  IV  3.375 g Intravenous Q8H  . predniSONE  10 mg Oral Daily  . saccharomyces boulardii  250 mg Oral BID  .  sertraline  50 mg Oral QPM  . vitamin C  500 mg Oral Daily   Continuous Infusions: . sodium chloride 100 mL/hr at 12/20/12 0424    Principal Problem:   Fever Active Problems:   RA (rheumatoid arthritis)   GERD (gastroesophageal reflux disease)   Hypothyroidism   Depression   Anxiety   Hypertension   Sepsis    Time spent: 25 min    [facility]  [facility] Pager [phone]. If 7PM-7AM, please contact night-coverage at [email or web address], password [facility] 12/20/2012, 12:08 PM  LOS: 2 days

## 2012-12-20 NOTE — Progress Notes (Signed)
Physical Therapy Treatment Patient Details Name: [name] MRN: [id number] DOB: Dec 22, 1929 Today's Date: 12/20/2012 Time: 1207-1225 PT Time Calculation (min): 18 min  PT Assessment / Plan / Recommendation Comments on Treatment Session  Decreased assist required for bed to chair transfer. Pt performed BLE exercises. Progressing well. Plans to return to [facility]     Follow Up Recommendations  SNF;Supervision/Assistance - 24 hour     Does the patient have the potential to tolerate intense rehabilitation     Barriers to Discharge        Equipment Recommendations  None recommended by PT    Recommendations for Other Services OT consult  Frequency Min 3X/week   Plan Discharge plan remains appropriate;Frequency remains appropriate    Precautions / Restrictions Precautions Precautions: Fall Restrictions Weight Bearing Restrictions: No   Pertinent Vitals/Pain **no c/o pain*    Mobility  Bed Mobility Bed Mobility: Supine to Sit;Sitting - Scoot to Edge of Bed Supine to Sit: 4: Min assist Details for Bed Mobility Assistance: Min A to rise and to pivot hips to EOB Transfers Transfers: Sit to Stand;Stand to Sit;Stand Pivot Transfers Sit to Stand: With upper extremity assist;From bed;4: Min assist Stand to Sit: With upper extremity assist;With armrests;To chair/3-in-1;4: Min assist Stand Pivot Transfers: 4: Min assist Details for Transfer Assistance: Min A to pivot bed to chair Ambulation/Gait Ambulation/Gait Assistance: Not tested (comment) General Gait Details: pt non ambulatory PTA    Exercises General Exercises - Lower Extremity Long Arc Quad: AROM;Both;10 reps;Seated Hip Flexion/Marching: AROM;Both;Seated;10 reps   PT Diagnosis:    PT Problem List:   PT Treatment Interventions:     PT Goals Acute Rehab PT Goals PT Goal Formulation: With patient Time For Goal Achievement: 12/26/12 Potential to Achieve Goals: Good Pt will go Supine/Side to Sit: with min assist PT  Goal: Supine/Side to Sit - Progress: Met Pt will go Sit to Supine/Side: with min assist Pt will go Sit to Stand: with min assist PT Goal: Sit to Stand - Progress: Met Pt will go Stand to Sit: with min assist PT Goal: Stand to Sit - Progress: Met Pt will Transfer Bed to Chair/Chair to Bed: with min assist PT Transfer Goal: Bed to Chair/Chair to Bed - Progress: Met Pt will Ambulate: 1 - 15 feet;with mod assist;with least restrictive assistive device  Visit Information  Last PT Received On: 12/20/12 Assistance Needed: +1    Subjective Data  Subjective: I can get up.  Patient Stated Goal: to return to [place] [facility]   Cognition  Cognition Arousal/Alertness: Awake/alert Behavior During Therapy: WFL for tasks assessed/performed Overall Cognitive Status: Within Functional Limits for tasks assessed    Balance  Balance Balance Assessed: Yes Static Sitting Balance Static Sitting - Balance Support: Bilateral upper extremity supported;Feet supported Static Sitting - Level of Assistance: 5: Stand by assistance Static Sitting - Comment/# of Minutes: 4  End of Session PT - End of Session Activity Tolerance: Patient tolerated treatment well Patient left: in chair;with call bell/phone within reach Nurse Communication: Mobility status   GP     [name] 12/20/2012, 12:48 PM [phone]

## 2012-12-20 NOTE — Consult Note (Signed)
[facility]    Date of Admission:  12/18/2012           Day 3 piperacillin tazobactam       Reason for Consult: Acute gastroenteritis    Referring Physician: Dr. Mauro Kaufmann  Principal Problem:   Acute gastroenteritis Active Problems:   RA (rheumatoid arthritis)   GERD (gastroesophageal reflux disease)   Hypothyroidism   Depression   Anxiety   Hypertension   Sepsis   . amLODipine  10 mg Oral Daily  . aspirin EC  81 mg Oral Daily  . atorvastatin  40 mg Oral q1800  . baclofen  10 mg Oral QHS  . enoxaparin (LOVENOX) injection  40 mg Subcutaneous Q24H  . ferrous sulfate  325 mg Oral Q breakfast  . Folic Acid-Vit B6-Vit B12  1 tablet Oral Daily  . levothyroxine  50 mcg Oral QAC breakfast  . metoprolol  50 mg Oral BID  . omega-3 acid ethyl esters  1 g Oral BID  . pantoprazole  80 mg Oral Q1200  . piperacillin-tazobactam (ZOSYN)  IV  3.375 g Intravenous Q8H  . predniSONE  10 mg Oral Daily  . saccharomyces boulardii  250 mg Oral BID  . sertraline  50 mg Oral QPM  . vitamin C  500 mg Oral Daily    Recommendations: 1. Discontinue piperacillin tazobactam and observe off of antibiotics   Assessment: Despite having mild dysuria on admission she has no evidence of urinary tract infection by urinalysis or culture. I suspect that she had acute viral gastroenteritis and probably acquired it from her roommate at her skilled nursing facility. I will discontinue piperacillin tazobactam.    HPI: [name] is a 79 y.o. female who was admitted 2 days ago with a recent history of nausea progressing to vomiting, diarrhea, fever and leukocytosis. She says that her roommate at her nursing home had been having problems with nausea, vomiting and diarrhea for one week before she became ill. She did notice some very mild dysuria as well. She is feeling markedly better today and has only mild nausea but has been able to eat breakfast. She's not had any  further vomiting or diarrhea for the past 36 hours.   Review of Systems: Constitutional: positive for fevers, negative for anorexia, chills, sweats and weight loss Eyes: negative Ears, nose, mouth, throat, and face: negative Respiratory: negative Cardiovascular: negative Gastrointestinal: positive for diarrhea, nausea and vomiting, negative for abdominal pain, constipation and odynophagia Genitourinary:positive for dysuria, negative for frequency and urinary incontinence  Past Medical History  Diagnosis Date  . SOB (shortness of breath)   . MI (myocardial infarction)   . Poor appetite   . Arthritis   . Fatigue   . Right ear pain   . Renal insufficiency   . RA (rheumatoid arthritis)   . PUD (peptic ulcer disease)   . GI bleed 2005  . GERD (gastroesophageal reflux disease)   . Hiatal hernia   . Hypothyroidism   . Osteoporosis   . Depression   . Anxiety   . Coronary artery disease   . CVA (cerebral vascular accident)   . TIA (transient ischemic attack)   . Fall   . DVT of lower extremity (deep venous thrombosis)     History  Substance Use Topics  . Smoking status: Never Smoker   . Smokeless tobacco: Never Used  . Alcohol Use: No    Family History  Problem  Relation Age of Onset  . Kidney disease Mother   . Kidney disease Brother    Allergies  Allergen Reactions  . Codeine     sick  . Morphine And Related Other (See Comments)    unknown  . Percocet (Oxycodone-Acetaminophen)     unknown  . Plavix (Clopidogrel Bisulfate) Other (See Comments)    unknown  . Sulfur Other (See Comments)    unknown    OBJECTIVE: Blood pressure 124/69, pulse 77, temperature 97 F (36.1 C), temperature source Oral, resp. rate 16, height 5\' 5"  (1.651 m), weight 0 kg (0 lb), SpO2 99.00%. General: She is alert and comfortable sitting up eating lunch Oral: No oropharyngeal lesions Skin: Scattered ecchymoses Lungs: Clear Cor: Regular S1 and S2 no murmurs Abdomen:  Soft and  nontender Neuro: Alert and oriented with normal speech Joints and extremities: No acute abnormalities. Chronic changes of rheumatoid arthritis Mood and affect: Normal  Microbiology: Recent Results (from the past 240 hour(s))  URINE CULTURE     Status: None   Collection Time    12/18/12 12:03 PM      Result Value Range Status   Specimen Description URINE, CATHETERIZED   Final   Special Requests NONE   Final   Culture  Setup Time 12/18/2012 19:36   Final   Colony Count NO GROWTH   Final   Culture NO GROWTH   Final   Report Status 12/19/2012 FINAL   Final  CULTURE, BLOOD (ROUTINE X 2)     Status: None   Collection Time    12/18/12  6:00 PM      Result Value Range Status   Specimen Description BLOOD RIGHT FOREARM  4 ML IN [facility] BOTTLE   Final   Special Requests Immunocompromised   Final   Culture  Setup Time 12/18/2012 23:01   Final   Culture     Final   Value:        BLOOD CULTURE RECEIVED NO GROWTH TO DATE CULTURE WILL BE HELD FOR 5 DAYS BEFORE ISSUING A FINAL NEGATIVE REPORT   Report Status PENDING   Incomplete  CULTURE, BLOOD (ROUTINE X 2)     Status: None   Collection Time    12/18/12  6:10 PM      Result Value Range Status   Specimen Description BLOOD RIGHT HAND  4 ML IN [facility] BOTTLE   Final   Special Requests Immunocompromised ON PREDNISONE 5 MG DAILY   Final   Culture  Setup Time 12/18/2012 23:01   Final   Culture     Final   Value:        BLOOD CULTURE RECEIVED NO GROWTH TO DATE CULTURE WILL BE HELD FOR 5 DAYS BEFORE ISSUING A FINAL NEGATIVE REPORT   Report Status PENDING   Incomplete    Cliffton Asters, MD [facility] [phone] pager   [phone] cell 12/20/2012, 12:35 PM

## 2012-12-21 DIAGNOSIS — A419 Sepsis, unspecified organism: Secondary | ICD-10-CM

## 2012-12-21 DIAGNOSIS — A0472 Enterocolitis due to Clostridium difficile, not specified as recurrent: Secondary | ICD-10-CM | POA: Diagnosis not present

## 2012-12-21 LAB — CBC
Hemoglobin: 10 g/dL — ABNORMAL LOW (ref 12.0–15.0)
MCHC: 33.8 g/dL (ref 30.0–36.0)
RDW: 14.9 % (ref 11.5–15.5)

## 2012-12-21 LAB — PRO B NATRIURETIC PEPTIDE: Pro B Natriuretic peptide (BNP): 1164 pg/mL — ABNORMAL HIGH (ref 0–450)

## 2012-12-21 LAB — BASIC METABOLIC PANEL
BUN: 10 mg/dL (ref 6–23)
Creatinine, Ser: 1.04 mg/dL (ref 0.50–1.10)
GFR calc Af Amer: 57 mL/min — ABNORMAL LOW (ref >90)
GFR calc non Af Amer: 49 mL/min — ABNORMAL LOW (ref >90)
Potassium: 3.2 mEq/L — ABNORMAL LOW (ref 3.5–5.1)

## 2012-12-21 LAB — TSH: TSH: 0.374 u[IU]/mL (ref 0.350–4.500)

## 2012-12-21 MED ORDER — FUROSEMIDE 10 MG/ML IJ SOLN
20.0000 mg | Freq: Once | INTRAMUSCULAR | Status: AC
  Administered 2012-12-21: 20 mg via INTRAVENOUS
  Filled 2012-12-21: qty 2

## 2012-12-21 MED ORDER — FUROSEMIDE 20 MG PO TABS
20.0000 mg | ORAL_TABLET | Freq: Every day | ORAL | Status: DC
  Administered 2012-12-22: 20 mg via ORAL
  Filled 2012-12-21: qty 1

## 2012-12-21 MED ORDER — METRONIDAZOLE IN NACL 5-0.79 MG/ML-% IV SOLN
500.0000 mg | Freq: Three times a day (TID) | INTRAVENOUS | Status: DC
  Administered 2012-12-21: 500 mg via INTRAVENOUS
  Filled 2012-12-21 (×2): qty 100

## 2012-12-21 MED ORDER — VANCOMYCIN 50 MG/ML ORAL SOLUTION
125.0000 mg | Freq: Four times a day (QID) | ORAL | Status: DC
  Filled 2012-12-21 (×2): qty 2.5

## 2012-12-21 MED ORDER — VANCOMYCIN 50 MG/ML ORAL SOLUTION
125.0000 mg | Freq: Four times a day (QID) | ORAL | Status: DC
  Administered 2012-12-21 – 2012-12-22 (×4): 125 mg via ORAL
  Filled 2012-12-21 (×6): qty 2.5

## 2012-12-21 MED ORDER — POTASSIUM CHLORIDE 10 MEQ/100ML IV SOLN
10.0000 meq | INTRAVENOUS | Status: AC
  Administered 2012-12-21 (×3): 10 meq via INTRAVENOUS
  Filled 2012-12-21 (×3): qty 100

## 2012-12-21 NOTE — Progress Notes (Signed)
Clinical Social Work Department BRIEF PSYCHOSOCIAL ASSESSMENT 12/21/2012  Patient:  [name], [name]     Account Number:  [id number]     Admit date:  12/18/2012  Clinical Social Worker:  Jacelyn Grip  Date/Time:  12/21/2012 11:00 AM  Referred by:  Physician  Date Referred:  12/21/2012 Referred for  SNF Placement   Other Referral:   Interview type:  Patient Other interview type:    PSYCHOSOCIAL DATA Living Status:  FACILITY Admitted from facility:   Level of care:   Primary support name:  Greggory Stallion Rubert/son/[phone] Primary support relationship to patient:  CHILD, ADULT Degree of support available:   adequate    CURRENT CONCERNS Current Concerns  Post-Acute Placement   Other Concerns:    SOCIAL WORK ASSESSMENT / PLAN CSW received notification from MD that pt admitted from SNF.    CSW met with pt at bedside.    Pt reports that she is a resident at [facility] and plans to return when medically stable for discharge.    Pt reports that per MD, potential for d/c tomorrow.    CSW contacted [facility] and facility can accept pt back when pt medically ready.    CSW to continue to follow and facilitate pt discharge needs when pt medically stable for discharge.   Assessment/plan status:  Psychosocial Support/Ongoing Assessment of Needs Other assessment/ plan:   discharge planning   Information/referral to community resources:   Referral back to [facility]    PATIENT'S/FAMILY'S RESPONSE TO PLAN OF CARE: Pt alert and oriented x 4. Pt reports that she has been at [facility] for some time and is agreeable to return to facility at d/c.       Jacklynn Lewis, MSW, LCSWA  Clinical Social Work [phone]

## 2012-12-21 NOTE — Progress Notes (Signed)
CRITICAL VALUE ALERT  Critical value received:  Positive C  Diff  Date of notification:  12/22/10  Time of notification:  1150  Critical value read back:yes  Nurse who received alert:  Orlene Och RN  MD notified (1st page):  [place]  Time of first page:  1220  MD notified (2nd page):  Time of second page:  Responding MD:  Sharl Ma  Time MD responded:  1240

## 2012-12-21 NOTE — Progress Notes (Signed)
Physical Therapy Treatment Patient Details Name: [name] MRN: [id number] DOB: 25-May-1930 Today's Date: 12/21/2012 Time: 4098-1191 PT Time Calculation (min): 24 min  PT Assessment / Plan / Recommendation Comments on Treatment Session  Pt performed pivot transfer x 2, and BLE exercises. Acitivty tolerance limited by fatigue.  REady to try ambulation next visit.     Follow Up Recommendations  SNF;Supervision/Assistance - 24 hour     Does the patient have the potential to tolerate intense rehabilitation     Barriers to Discharge        Equipment Recommendations  None recommended by PT    Recommendations for Other Services OT consult  Frequency Min 3X/week   Plan Discharge plan remains appropriate;Frequency remains appropriate    Precautions / Restrictions Precautions Precautions: Fall Restrictions Weight Bearing Restrictions: No   Pertinent Vitals/Pain *0/10 pain**    Mobility  Bed Mobility Bed Mobility: Supine to Sit;Sitting - Scoot to Edge of Bed Supine to Sit: 4: Min assist Details for Bed Mobility Assistance: Min A to rise and to pivot hips to EOB Transfers Transfers: Sit to Stand;Stand to Sit;Stand Pivot Transfers Sit to Stand: With upper extremity assist;From bed;4: Min assist;From chair/3-in-1 Stand to Sit: With upper extremity assist;With armrests;To chair/3-in-1;4: Min assist Stand Pivot Transfers: 4: Min assist Details for Transfer Assistance: Min A to pivot bed to 3 in 1, then 3 in 1 to recliner using RW Ambulation/Gait Ambulation/Gait Assistance: Not tested (comment) General Gait Details: pt non ambulatory PTA    Exercises General Exercises - Lower Extremity Ankle Circles/Pumps: AROM;Both;10 reps;Supine Heel Slides: AROM;AAROM;Both;10 reps;Supine Hip ABduction/ADduction: AROM;AAROM;Both;10 reps   PT Diagnosis:    PT Problem List:   PT Treatment Interventions:     PT Goals Acute Rehab PT Goals PT Goal Formulation: With patient Time For Goal  Achievement: 12/26/12 Potential to Achieve Goals: Good Pt will go Supine/Side to Sit: with supervision PT Goal: Supine/Side to Sit - Progress: Updated due to goal met Pt will go Sit to Supine/Side: with min assist Pt will go Sit to Stand: with supervision PT Goal: Sit to Stand - Progress: Updated due to goal met Pt will go Stand to Sit: with supervision PT Goal: Stand to Sit - Progress: Updated due to goals met Pt will Transfer Bed to Chair/Chair to Bed: with supervision PT Transfer Goal: Bed to Chair/Chair to Bed - Progress: Updated due to goal met Pt will Ambulate: 1 - 15 feet;with mod assist;with least restrictive assistive device  Visit Information  Last PT Received On: 12/21/12 Assistance Needed: +1 Reason Eval/Treat Not Completed:  (pt refused x 2)    Subjective Data  Subjective: I think I can walk next time. Patient Stated Goal: to return to [place] place   Cognition  Cognition Arousal/Alertness: Awake/alert Behavior During Therapy: WFL for tasks assessed/performed Overall Cognitive Status: Within Functional Limits for tasks assessed    Balance     End of Session PT - End of Session Equipment Utilized During Treatment: Gait belt Activity Tolerance: Patient tolerated treatment well Patient left: in chair;with call bell/phone within reach Nurse Communication: Mobility status   GP     [name] 12/21/2012, 2:35 PM [phone]

## 2012-12-21 NOTE — Progress Notes (Signed)
PT Cancellation Note  Patient Details Name: [name] MRN: [id number] DOB: Nov 09, 1929   Cancelled Treatment:    Reason Eval/Treat Not Completed:  (pt refused x 2). Will follow.    [name] 12/21/2012, 1:07 PM [phone]

## 2012-12-21 NOTE — Progress Notes (Signed)
[facility] PROGRESS NOTE  [name] ZOX:[id number] DOB: [birth year] DOA: 12/18/2012 PCP: Oneal Grout, MD  Assessment/Plan: 1. Fever- Resolved, she does have positive c diff, will start her on Flagyl.  Urine culture is negative, blood culture is pending.ID following.                                                    2. Adrenal insufficiency- continue with po prednisone.                                                3. Hypertension- BP is reasonably controlled, continue amlodipine, metoprolol.                                     4. Hypothyroidism- Continue synthroid.  5. Dyspnea- Patient has mild fluid overload, will obtain BNP. Give one dose of lasix 20 mg IV x 1  6. Hypokalemia: will replace the potassium and check BMP in am.                                           7. Acute Kidney injury-  Cr is at baseline.  8. DVT prophylaxis- Lovenox.  Code Status: Full code Family Communication: Discussed with patient in detail Disposition Plan: SNF   Consultants:  ID  Procedures:  None  Antibiotics:  Zosyn 6/21>>  HPI/Subjective: Patient seen and examined, admitted with fever, leukocytosis, WBC is now back to normal, she is afebrile. Urine culture is negative. Her stool for C diff is positive.  Objective: Filed Vitals:   12/20/12 0500 12/20/12 1350 12/20/12 2017 12/21/12 0451  BP: 124/69 127/63 151/72 154/72  Pulse: 77 87 85 84  Temp: 97 F (36.1 C) 98.9 F (37.2 C) 97.5 F (36.4 C) 98.1 F (36.7 C)  TempSrc: Oral Oral Oral Oral  Resp: 16 18 18 18   Height:      Weight:      SpO2: 99% 100% 97% 92%    Intake/Output Summary (Last 24 hours) at 12/21/12 1300 Last data filed at 12/21/12 0900  Gross per 24 hour  Intake 1956.67 ml  Output      0 ml  Net 1956.67 ml   Filed Weights   12/19/12 0511  Weight: 67.994 kg (149 lb 14.4 oz)    Exam:   General: Appear in no acute distress  Cardiovascular: S1S2 RRR  Respiratory: Clear  bilaterally  Abdomen: Soft, non tender, no organomegaly  Musculoskeletal: No edema of lower extremities  Data Reviewed: Basic Metabolic Panel:  Recent Labs Lab 12/18/12 1115 12/19/12 0458 12/20/12 0430 12/21/12 0359  NA 139 139 140 140  K 3.6 3.5 3.5 3.2*  CL 100 105 107 107  CO2 29 26 26 26   GLUCOSE 114* 90 105* 93  BUN 26* 17 14 10   CREATININE 1.32* 1.19* 1.25* 1.04  CALCIUM 10.0 8.6 8.5 8.3*   Liver Function Tests: No results found for this basename: AST, ALT, ALKPHOS, BILITOT, PROT, ALBUMIN,  in the last 168 hours No results found  for this basename: LIPASE, AMYLASE,  in the last 168 hours No results found for this basename: AMMONIA,  in the last 168 hours CBC:  Recent Labs Lab 12/18/12 1115 12/19/12 0458 12/20/12 0430 12/21/12 0359  WBC 21.1* 13.2* 8.6 8.7  NEUTROABS 17.9*  --   --   --   HGB 11.9* 10.5* 9.9* 10.0*  HCT 36.7 32.2* 30.0* 29.6*  MCV 86.2 87.7 86.5 84.8  PLT 159 142* 132* 144*   Cardiac Enzymes:  Recent Labs Lab 12/18/12 1115  TROPONINI <0.30   BNP (last 3 results)  Recent Labs  03/17/12 0530 03/18/12 0400 12/21/12 0359  PROBNP 1429.0* 3700.0* 1164.0*   CBG: No results found for this basename: GLUCAP,  in the last 168 hours  Recent Results (from the past 240 hour(s))  URINE CULTURE     Status: None   Collection Time    12/18/12 12:03 PM      Result Value Range Status   Specimen Description URINE, CATHETERIZED   Final   Special Requests NONE   Final   Culture  Setup Time 12/18/2012 19:36   Final   Colony Count NO GROWTH   Final   Culture NO GROWTH   Final   Report Status 12/19/2012 FINAL   Final  CULTURE, BLOOD (ROUTINE X 2)     Status: None   Collection Time    12/18/12  6:00 PM      Result Value Range Status   Specimen Description BLOOD RIGHT FOREARM  4 ML IN [facility] BOTTLE   Final   Special Requests Immunocompromised   Final   Culture  Setup Time 12/18/2012 23:01   Final   Culture     Final   Value:        BLOOD CULTURE  RECEIVED NO GROWTH TO DATE CULTURE WILL BE HELD FOR 5 DAYS BEFORE ISSUING A FINAL NEGATIVE REPORT   Report Status PENDING   Incomplete  CULTURE, BLOOD (ROUTINE X 2)     Status: None   Collection Time    12/18/12  6:10 PM      Result Value Range Status   Specimen Description BLOOD RIGHT HAND  4 ML IN [facility] BOTTLE   Final   Special Requests Immunocompromised ON PREDNISONE 5 MG DAILY   Final   Culture  Setup Time 12/18/2012 23:01   Final   Culture     Final   Value:        BLOOD CULTURE RECEIVED NO GROWTH TO DATE CULTURE WILL BE HELD FOR 5 DAYS BEFORE ISSUING A FINAL NEGATIVE REPORT   Report Status PENDING   Incomplete  CLOSTRIDIUM DIFFICILE BY PCR     Status: Abnormal   Collection Time    12/21/12  9:21 AM      Result Value Range Status   C difficile by pcr POSITIVE (*) NEGATIVE Final   Comment: CRITICAL RESULT CALLED TO, READ BACK BY AND VERIFIED WITH:     Jesse Sans RN 12/21/12 1156 COSTELLO B     Studies: No results found.  Scheduled Meds: . amLODipine  10 mg Oral Daily  . aspirin EC  81 mg Oral Daily  . atorvastatin  40 mg Oral q1800  . baclofen  10 mg Oral QHS  . enoxaparin (LOVENOX) injection  40 mg Subcutaneous Q24H  . ferrous sulfate  325 mg Oral Q breakfast  . Folic Acid-Vit B6-Vit B12  1 tablet Oral Daily  . [START ON 12/22/2012] furosemide  20 mg Oral  Daily  . levothyroxine  50 mcg Oral QAC breakfast  . metoprolol  50 mg Oral BID  . metronidazole  500 mg Intravenous Q8H  . omega-3 acid ethyl esters  1 g Oral BID  . pantoprazole  80 mg Oral Q1200  . potassium chloride  10 mEq Intravenous Q1 Hr x 3  . predniSONE  10 mg Oral Daily  . saccharomyces boulardii  250 mg Oral BID  . sertraline  50 mg Oral QPM  . vitamin C  500 mg Oral Daily   Continuous Infusions: . sodium chloride 10 mL/hr at 12/21/12 2130    Principal Problem:   Acute gastroenteritis Active Problems:   RA (rheumatoid arthritis)   GERD (gastroesophageal reflux disease)   Hypothyroidism   Depression    Anxiety   Hypertension   Sepsis    Time spent: 25 min    [facility]  [facility] Pager [phone]. If 7PM-7AM, please contact night-coverage at [email or web address], password [facility] 12/21/2012, 1:00 PM  LOS: 3 days

## 2012-12-21 NOTE — Progress Notes (Signed)
Patient ID: [name], female   DOB: 1929/07/07, 79 y.o.   MRN: [id number]         [facility]    Date of Admission:  12/18/2012           Day 1 IV metronidazole  Principal Problem:   Acute gastroenteritis Active Problems:   RA (rheumatoid arthritis)   GERD (gastroesophageal reflux disease)   Hypothyroidism   Depression   Anxiety   Hypertension   Sepsis   . amLODipine  10 mg Oral Daily  . aspirin EC  81 mg Oral Daily  . atorvastatin  40 mg Oral q1800  . baclofen  10 mg Oral QHS  . enoxaparin (LOVENOX) injection  40 mg Subcutaneous Q24H  . ferrous sulfate  325 mg Oral Q breakfast  . Folic Acid-Vit B6-Vit B12  1 tablet Oral Daily  . [START ON 12/22/2012] furosemide  20 mg Oral Daily  . levothyroxine  50 mcg Oral QAC breakfast  . metoprolol  50 mg Oral BID  . metronidazole  500 mg Intravenous Q8H  . omega-3 acid ethyl esters  1 g Oral BID  . pantoprazole  80 mg Oral Q1200  . predniSONE  10 mg Oral Daily  . saccharomyces boulardii  250 mg Oral BID  . sertraline  50 mg Oral QPM  . vitamin C  500 mg Oral Daily    Subjective: She is feeling much better. She's not having any more abdominal cramps, nausea, vomiting or diarrhea. She has a history of C. difficile colitis last December treated with oral metronidazole. She is the primary caregiver for her husband who had severe her and frequent recurrences of C. difficile colitis in the past. Today her C. difficile PCR was reported to be positive.  Review of Systems: Pertinent items are noted in HPI.  Past Medical History  Diagnosis Date  . SOB (shortness of breath)   . MI (myocardial infarction)   . Poor appetite   . Arthritis   . Fatigue   . Right ear pain   . Renal insufficiency   . RA (rheumatoid arthritis)   . PUD (peptic ulcer disease)   . GI bleed 2005  . GERD (gastroesophageal reflux disease)   . Hiatal hernia   . Hypothyroidism   . Osteoporosis   . Depression   . Anxiety   .  Coronary artery disease   . CVA (cerebral vascular accident)   . TIA (transient ischemic attack)   . Fall   . DVT of lower extremity (deep venous thrombosis)     History  Substance Use Topics  . Smoking status: Never Smoker   . Smokeless tobacco: Never Used  . Alcohol Use: No    Family History  Problem Relation Age of Onset  . Kidney disease Mother   . Kidney disease Brother     Allergies  Allergen Reactions  . Codeine     sick  . Morphine And Related Other (See Comments)    unknown  . Percocet (Oxycodone-Acetaminophen)     unknown  . Plavix (Clopidogrel Bisulfate) Other (See Comments)    unknown  . Sulfur Other (See Comments)    unknown    Objective: Temp:  [97.5 F (36.4 C)-98.2 F (36.8 C)] 98.2 F (36.8 C) (06/24 1414) Pulse Rate:  [80-85] 80 (06/24 1414) Resp:  [18] 18 (06/24 1414) BP: (136-154)/(72-74) 136/74 mmHg (06/24 1414) SpO2:  [92 %-98 %] 98 % (06/24 1414)  General: She is in good  spirits sitting up in a chair eating dinner Skin: No rash Lungs: Clear Cor: Regular S1 and S2 no murmurs Abdomen: Soft and nontender  Lab Results Lab Results  Component Value Date   WBC 8.7 12/21/2012   HGB 10.0* 12/21/2012   HCT 29.6* 12/21/2012   MCV 84.8 12/21/2012   PLT 144* 12/21/2012    Lab Results  Component Value Date   CREATININE 1.04 12/21/2012   BUN 10 12/21/2012   NA 140 12/21/2012   K 3.2* 12/21/2012   CL 107 12/21/2012   CO2 26 12/21/2012    Lab Results  Component Value Date   ALT 17 04/05/2012   AST 16 04/05/2012   ALKPHOS 55 04/05/2012   BILITOT 0.4 04/05/2012      Microbiology: Recent Results (from the past 240 hour(s))  URINE CULTURE     Status: None   Collection Time    12/18/12 12:03 PM      Result Value Range Status   Specimen Description URINE, CATHETERIZED   Final   Special Requests NONE   Final   Culture  Setup Time 12/18/2012 19:36   Final   Colony Count NO GROWTH   Final   Culture NO GROWTH   Final   Report Status 12/19/2012 FINAL    Final  CULTURE, BLOOD (ROUTINE X 2)     Status: None   Collection Time    12/18/12  6:00 PM      Result Value Range Status   Specimen Description BLOOD RIGHT FOREARM  4 ML IN [facility] BOTTLE   Final   Special Requests Immunocompromised   Final   Culture  Setup Time 12/18/2012 23:01   Final   Culture     Final   Value:        BLOOD CULTURE RECEIVED NO GROWTH TO DATE CULTURE WILL BE HELD FOR 5 DAYS BEFORE ISSUING A FINAL NEGATIVE REPORT   Report Status PENDING   Incomplete  CULTURE, BLOOD (ROUTINE X 2)     Status: None   Collection Time    12/18/12  6:10 PM      Result Value Range Status   Specimen Description BLOOD RIGHT HAND  4 ML IN [facility] BOTTLE   Final   Special Requests Immunocompromised ON PREDNISONE 5 MG DAILY   Final   Culture  Setup Time 12/18/2012 23:01   Final   Culture     Final   Value:        BLOOD CULTURE RECEIVED NO GROWTH TO DATE CULTURE WILL BE HELD FOR 5 DAYS BEFORE ISSUING A FINAL NEGATIVE REPORT   Report Status PENDING   Incomplete  CLOSTRIDIUM DIFFICILE BY PCR     Status: Abnormal   Collection Time    12/21/12  9:21 AM      Result Value Range Status   C difficile by pcr POSITIVE (*) NEGATIVE Final   Comment: CRITICAL RESULT CALLED TO, READ BACK BY AND VERIFIED WITH:     Jesse Sans RN 12/21/12 1156 COSTELLO B    Studies/Results: No results found.  Assessment: She has recurrent C. difficile infection. Fortunately she has improved without any specific therapy but certainly is at risk for progression and further recurrences. I favor treatment with oral vancomycin for 14 days.  Plan: 1. Change IV metronidazole to oral vancomycin  Cliffton Asters, MD [facility] for [facility] Disease [facility] [phone] pager   [phone] cell 12/21/2012, 5:52 PM

## 2012-12-22 DIAGNOSIS — K219 Gastro-esophageal reflux disease without esophagitis: Secondary | ICD-10-CM | POA: Diagnosis not present

## 2012-12-22 DIAGNOSIS — J189 Pneumonia, unspecified organism: Secondary | ICD-10-CM | POA: Diagnosis not present

## 2012-12-22 DIAGNOSIS — Z791 Long term (current) use of non-steroidal anti-inflammatories (NSAID): Secondary | ICD-10-CM | POA: Diagnosis not present

## 2012-12-22 DIAGNOSIS — R197 Diarrhea, unspecified: Secondary | ICD-10-CM | POA: Diagnosis not present

## 2012-12-22 DIAGNOSIS — R5381 Other malaise: Secondary | ICD-10-CM | POA: Diagnosis not present

## 2012-12-22 DIAGNOSIS — B351 Tinea unguium: Secondary | ICD-10-CM | POA: Diagnosis not present

## 2012-12-22 DIAGNOSIS — G459 Transient cerebral ischemic attack, unspecified: Secondary | ICD-10-CM | POA: Diagnosis not present

## 2012-12-22 DIAGNOSIS — M810 Age-related osteoporosis without current pathological fracture: Secondary | ICD-10-CM | POA: Diagnosis present

## 2012-12-22 DIAGNOSIS — N183 Chronic kidney disease, stage 3 unspecified: Secondary | ICD-10-CM | POA: Diagnosis present

## 2012-12-22 DIAGNOSIS — E785 Hyperlipidemia, unspecified: Secondary | ICD-10-CM | POA: Diagnosis not present

## 2012-12-22 DIAGNOSIS — M069 Rheumatoid arthritis, unspecified: Secondary | ICD-10-CM | POA: Diagnosis present

## 2012-12-22 DIAGNOSIS — R262 Difficulty in walking, not elsewhere classified: Secondary | ICD-10-CM | POA: Diagnosis not present

## 2012-12-22 DIAGNOSIS — F411 Generalized anxiety disorder: Secondary | ICD-10-CM | POA: Diagnosis present

## 2012-12-22 DIAGNOSIS — E039 Hypothyroidism, unspecified: Secondary | ICD-10-CM | POA: Diagnosis present

## 2012-12-22 DIAGNOSIS — E2749 Other adrenocortical insufficiency: Secondary | ICD-10-CM | POA: Diagnosis not present

## 2012-12-22 DIAGNOSIS — E860 Dehydration: Secondary | ICD-10-CM | POA: Diagnosis present

## 2012-12-22 DIAGNOSIS — D649 Anemia, unspecified: Secondary | ICD-10-CM | POA: Diagnosis not present

## 2012-12-22 DIAGNOSIS — K449 Diaphragmatic hernia without obstruction or gangrene: Secondary | ICD-10-CM | POA: Diagnosis not present

## 2012-12-22 DIAGNOSIS — R110 Nausea: Secondary | ICD-10-CM | POA: Diagnosis not present

## 2012-12-22 DIAGNOSIS — S37009A Unspecified injury of unspecified kidney, initial encounter: Secondary | ICD-10-CM | POA: Diagnosis not present

## 2012-12-22 DIAGNOSIS — D72828 Other elevated white blood cell count: Secondary | ICD-10-CM | POA: Diagnosis not present

## 2012-12-22 DIAGNOSIS — D631 Anemia in chronic kidney disease: Secondary | ICD-10-CM | POA: Diagnosis present

## 2012-12-22 DIAGNOSIS — R109 Unspecified abdominal pain: Secondary | ICD-10-CM | POA: Diagnosis not present

## 2012-12-22 DIAGNOSIS — N179 Acute kidney failure, unspecified: Secondary | ICD-10-CM | POA: Diagnosis not present

## 2012-12-22 DIAGNOSIS — I6990 Unspecified sequelae of unspecified cerebrovascular disease: Secondary | ICD-10-CM | POA: Diagnosis not present

## 2012-12-22 DIAGNOSIS — I10 Essential (primary) hypertension: Secondary | ICD-10-CM | POA: Diagnosis not present

## 2012-12-22 DIAGNOSIS — R1310 Dysphagia, unspecified: Secondary | ICD-10-CM | POA: Diagnosis present

## 2012-12-22 DIAGNOSIS — F3289 Other specified depressive episodes: Secondary | ICD-10-CM | POA: Diagnosis present

## 2012-12-22 DIAGNOSIS — IMO0002 Reserved for concepts with insufficient information to code with codable children: Secondary | ICD-10-CM | POA: Diagnosis not present

## 2012-12-22 DIAGNOSIS — B999 Unspecified infectious disease: Secondary | ICD-10-CM | POA: Diagnosis not present

## 2012-12-22 DIAGNOSIS — Z8673 Personal history of transient ischemic attack (TIA), and cerebral infarction without residual deficits: Secondary | ICD-10-CM | POA: Diagnosis not present

## 2012-12-22 DIAGNOSIS — E876 Hypokalemia: Secondary | ICD-10-CM | POA: Diagnosis present

## 2012-12-22 DIAGNOSIS — I82409 Acute embolism and thrombosis of unspecified deep veins of unspecified lower extremity: Secondary | ICD-10-CM | POA: Diagnosis not present

## 2012-12-22 DIAGNOSIS — I252 Old myocardial infarction: Secondary | ICD-10-CM | POA: Diagnosis not present

## 2012-12-22 DIAGNOSIS — M62838 Other muscle spasm: Secondary | ICD-10-CM | POA: Diagnosis not present

## 2012-12-22 DIAGNOSIS — Z9181 History of falling: Secondary | ICD-10-CM | POA: Diagnosis not present

## 2012-12-22 DIAGNOSIS — R6889 Other general symptoms and signs: Secondary | ICD-10-CM | POA: Diagnosis not present

## 2012-12-22 DIAGNOSIS — I129 Hypertensive chronic kidney disease with stage 1 through stage 4 chronic kidney disease, or unspecified chronic kidney disease: Secondary | ICD-10-CM | POA: Diagnosis present

## 2012-12-22 DIAGNOSIS — A0472 Enterocolitis due to Clostridium difficile, not specified as recurrent: Secondary | ICD-10-CM | POA: Diagnosis not present

## 2012-12-22 DIAGNOSIS — I2510 Atherosclerotic heart disease of native coronary artery without angina pectoris: Secondary | ICD-10-CM | POA: Diagnosis present

## 2012-12-22 DIAGNOSIS — G8929 Other chronic pain: Secondary | ICD-10-CM | POA: Diagnosis present

## 2012-12-22 DIAGNOSIS — A419 Sepsis, unspecified organism: Secondary | ICD-10-CM | POA: Diagnosis not present

## 2012-12-22 DIAGNOSIS — M6281 Muscle weakness (generalized): Secondary | ICD-10-CM | POA: Diagnosis not present

## 2012-12-22 DIAGNOSIS — R112 Nausea with vomiting, unspecified: Secondary | ICD-10-CM | POA: Diagnosis not present

## 2012-12-22 DIAGNOSIS — I69959 Hemiplegia and hemiparesis following unspecified cerebrovascular disease affecting unspecified side: Secondary | ICD-10-CM | POA: Diagnosis not present

## 2012-12-22 LAB — BASIC METABOLIC PANEL
Calcium: 8.9 mg/dL (ref 8.4–10.5)
Creatinine, Ser: 1.13 mg/dL — ABNORMAL HIGH (ref 0.50–1.10)
GFR calc Af Amer: 52 mL/min — ABNORMAL LOW (ref >90)

## 2012-12-22 LAB — CBC
Platelets: 173 10*3/uL (ref 150–400)
RDW: 14.6 % (ref 11.5–15.5)
WBC: 9.2 10*3/uL (ref 4.0–10.5)

## 2012-12-22 MED ORDER — LORAZEPAM 0.5 MG PO TABS: ORAL_TABLET | ORAL | Status: DC

## 2012-12-22 MED ORDER — SERTRALINE HCL 50 MG PO TABS: 50.0000 mg | ORAL_TABLET | Freq: Every evening | ORAL | Status: DC

## 2012-12-22 MED ORDER — HYDROCODONE-ACETAMINOPHEN 5-500 MG PO TABS: 1.0000 | ORAL_TABLET | Freq: Every day | ORAL | Status: DC

## 2012-12-22 MED ORDER — VANCOMYCIN 50 MG/ML ORAL SOLUTION: 125.0000 mg | Freq: Four times a day (QID) | ORAL | Status: DC

## 2012-12-22 MED ORDER — POLYETHYLENE GLYCOL 3350 17 G PO PACK: 17.0000 g | PACK | Freq: Every day | ORAL | Status: DC | PRN

## 2012-12-22 MED ORDER — POTASSIUM CHLORIDE CRYS ER 20 MEQ PO TBCR
40.0000 meq | EXTENDED_RELEASE_TABLET | Freq: Once | ORAL | Status: AC
  Administered 2012-12-22: 40 meq via ORAL
  Filled 2012-12-22: qty 2

## 2012-12-22 NOTE — Progress Notes (Signed)
Pt for discharge to [facility].  CSW facilitated pt d/c needs including contacting facility, faxing pt d/c information via [facility], providing RN phone number to call report, discussing with pt at bedside, and arranging ambulance transportation for pt to [facility] at 2 pm per pt request (Service Request ID #: [id number]).  No further social work needs identified at this time.  CSW signing off.   Jacklynn Lewis, MSW, LCSWA  Clinical Social Work [phone]

## 2012-12-22 NOTE — Progress Notes (Signed)
12/22/12 1300 Reviewed discharge instructions with patient. Patient verbalized understanding of discharge instructions. Copy of discharge instructions given to patient.

## 2012-12-22 NOTE — Discharge Summary (Signed)
Physician Discharge Summary  [name] GNF:[id number] DOB: Oct 21, 1929 DOA: 12/18/2012  PCP: Oneal Grout, MD  Admit date: 12/18/2012 Discharge date: 12/22/2012  Recommendations for Outpatient Follow-up:  1. Please continue prednisone 10 mg daily per nursing home instructions. If he was supposed to be discontinued the nursing home can please stop taking this medication. 2. Please continue vancomycin by mouth for next 14 days on discharge 3. Please check potassium level at least every 2 days until potassium stabilizes. Potassium has been repleted prior to discharge.  Discharge Diagnoses:  Principal Problem:   Acute gastroenteritis Active Problems:   RA (rheumatoid arthritis)   GERD (gastroesophageal reflux disease)   Hypothyroidism   Depression   Anxiety   Hypertension   Sepsis   Discharge Condition: medically stable to be discharged to SNF today  Diet recommendation: As tolerated  History of present illness:  79 y.o. female with past medical history of CAD, CVA, HLD, HTN, RA, adrenal insufficiency who presents from [facility] to [facility] ED 12/18/2012 with the chief complaint of fevers of up to 102 F vague abdominal pain for one day prior to this admission. Patient also reported associated diarrhea, nausea and vomiting. Patient reported having C. difficile colitis 1 year prior to this presentation and she was treated with Flagyl at that time.  Assessment and plan:  Principal problem: C. difficile colitis - Patient will continue taking vancomycin for 14 days on discharge. - C. difficile PCR positive - Diarrhea resolved - Appreciate infectious disease following while patient is in the hospital. Appreciate infectious disease recommendations.  Active problems: Fever - In the setting of C. difficile colitis Adrenal insufficiency - continue with po prednisone. Hypertension - Continue amlodipine, metoprolol Hypothyroidism - Continue synthroid. Hypokalemia - Secondary to GI  losses, nausea, vomiting and diarrhea - Potassium being repleted. Please note that potassium was repleted prior to discharge. - Mentation is that patient still has potassium checks while in the nursing home and replete as indicated Acute Renal Failure  Cr is at baseline.  Code Status: Full code  Family Communication: no family at the bedside  Consultants:  ID Procedures:  None Antibiotics:  Vancomycin for 14 days on discharge    Discharge Exam: Filed Vitals:   12/22/12 0954  BP: 138/69  Pulse: 78  Temp:   Resp: 20   Filed Vitals:   12/21/12 1414 12/21/12 2100 12/22/12 0500 12/22/12 0954  BP: 136/74 151/75 133/65 138/69  Pulse: 80 52 66 78  Temp: 98.2 F (36.8 C) 98.6 F (37 C) 97.5 F (36.4 C)   TempSrc: Oral Oral Oral   Resp: 18 18 20 20   Height:      Weight:      SpO2: 98% 97% 98%     General: Pt is alert, follows commands appropriately, not in acute distress Cardiovascular: Regular rate and rhythm, S1/S2 +, no murmurs, no rubs, no gallops Respiratory: Clear to auscultation bilaterally, no wheezing, no crackles, no rhonchi Abdominal: Soft, non tender, non distended, bowel sounds +, no guarding Extremities: no edema, no cyanosis, pulses palpable bilaterally DP and PT Neuro: Grossly nonfocal  Discharge Instructions  Discharge Orders   Future Orders Complete By Expires     Call MD for:  difficulty breathing, headache or visual disturbances  As directed     Call MD for:  persistant dizziness or light-headedness  As directed     Call MD for:  persistant nausea and vomiting  As directed     Call MD for:  severe uncontrolled  pain  As directed     Diet - low sodium heart healthy  As directed     Discharge instructions  As directed     Comments:      1. Please continue prednisone 10 mg daily per nursing home instructions. If he was supposed to be discontinued the nursing home can please stop taking this medication. 2. Please continue vancomycin by mouth for next 14  days on discahrge    Increase activity slowly  As directed         Medication List    TAKE these medications       acetaminophen 650 MG suppository  Commonly known as:  TYLENOL  Place 650 mg rectally every 4 (four) hours as needed. Pain     amLODipine 10 MG tablet  Commonly known as:  NORVASC  Take 1 tablet (10 mg total) by mouth daily.     aspirin 81 MG chewable tablet  Chew 81 mg by mouth daily.     baclofen 10 MG tablet  Commonly known as:  LIORESAL  Take 10 mg by mouth 3 (three) times daily - between meals and at bedtime.     esomeprazole 40 MG capsule  Commonly known as:  NEXIUM  Take 40 mg by mouth daily before breakfast.     ferrous sulfate 325 (65 FE) MG tablet  Take 325 mg by mouth daily with breakfast.     Fish Oil 1200 MG Caps  Take 1,200 mg by mouth daily.     Folic Acid-Vit B6-Vit B12 2.5-25-1 MG Tabs  Commonly known as:  FOLBEE  Take 1 tablet by mouth daily.     HYDROcodone-acetaminophen 5-500 MG per tablet  Commonly known as:  VICODIN  Take 1 tablet by mouth at bedtime. 1 tablet every 6 hrs as needed for pain     levothyroxine 50 MCG tablet  Commonly known as:  SYNTHROID, LEVOTHROID  Take 50 mcg by mouth daily.     LORazepam 0.5 MG tablet  Commonly known as:  ATIVAN  Take 1 tablet every night at bedtime as needed for anxiety     metoprolol 50 MG tablet  Commonly known as:  LOPRESSOR  Take 50 mg by mouth 2 (two) times daily.     multivitamin tablet  Take 1 tablet by mouth daily.     NON FORMULARY  Take 1 tablet by mouth daily. FOLIC ACID 1 MG/VITAMIN B 9     polyethylene glycol packet  Commonly known as:  MIRALAX / GLYCOLAX  Take 17 g by mouth daily as needed.     predniSONE 10 MG tablet  Commonly known as:  DELTASONE  Take 10 mg by mouth daily.     promethazine 25 MG tablet  Commonly known as:  PHENERGAN  Take 25 mg by mouth every 6 (six) hours as needed. nausea     rosuvastatin 20 MG tablet  Commonly known as:  CRESTOR  Take 20  mg by mouth every evening.     saccharomyces boulardii 250 MG capsule  Commonly known as:  FLORASTOR  Take 1 capsule (250 mg total) by mouth 2 (two) times daily.     sertraline 50 MG tablet  Commonly known as:  ZOLOFT  Take 1 tablet (50 mg total) by mouth every evening.     vancomycin 50 mg/mL oral solution  Commonly known as:  VANCOCIN  Take 2.5 mLs (125 mg total) by mouth 4 (four) times daily.     VITAMIN C  PO  Take 500 mg by mouth daily.           Follow-up Information   Follow up with Oneal Grout, MD In 2 weeks.   Contact information:   [address] [place] [address] [phone]        The results of significant diagnostics from this hospitalization (including imaging, microbiology, ancillary and laboratory) are listed below for reference.    Significant Diagnostic Studies: Ct Abdomen Pelvis Wo Contrast  12/18/2012   *RADIOLOGY REPORT*  Clinical Data: lower quadrant pain.  Leukocytosis.  CT ABDOMEN AND PELVIS WITHOUT CONTRAST  Technique:  Multidetector CT imaging of the abdomen and pelvis was performed following the standard protocol without intravenous contrast.  Comparison:  03/16/2012  Findings: Pleural thickening is noted in the medial right lung base.  There is mild subsegmental atelectasis within both lower lobes.  No focal liver abnormalities identified.  The gallbladder appears normal.  No biliary dilatation.  Normal appearance of the pancreas.  Calcified granuloma noted within the spleen.  The adrenal glands are both normal.  Bilateral renal cortical thinning is identified.  No hydronephrosis noted.  No nephrolithiasis.  The urinary bladder appears normal.  Previous hysterectomy.  Normal caliber of the abdominal aorta.  There is no aneurysm.  No enlarged upper abdominal lymph nodes.  There is no pelvic or inguinal adenopathy noted.  Large hiatal hernia noted.  The small bowel loops are within normal limits.  Scattered colonic diverticula noted.  No evidence for  acute colonic inflammation.  There is no free fluid or abnormal fluid collections identified.  Review of the visualized bony structures is remarkable for osteopenia.  The patient is status post hardware fixation of the thoracic and lumbar spine. Again noted are compression deformities involving the T10 and T12 vertebra. Chronic right superior and inferior pubic rami fractures are again identified.  IMPRESSION:  1.  No acute findings identified within the abdomen or pelvis. 2.  Large hiatal hernia.   Original Report Authenticated By: Signa Kell, M.D.   Dg Chest 2 View  12/18/2012   *RADIOLOGY REPORT*  Clinical Data: chest pain  CHEST - 2 VIEW  Comparison: 06/01/2012  Findings: Mild to moderate cardiac enlargement.  No effusion or edema.  The lung volumes are low.  Scar or plate- like atelectasis is noted in the left base.  No airspace consolidation.  Previous hardware fixation of the thoracic and lumbar spine.  Compression deformity within the lower thoracic spine appears similar to previous exam.  IMPRESSION: 1.  Low lung volumes. 2.  Scar versus plate-like atelectasis present in the left base.   Original Report Authenticated By: Signa Kell, M.D.    Microbiology: Recent Results (from the past 240 hour(s))  URINE CULTURE     Status: None   Collection Time    12/18/12 12:03 PM      Result Value Range Status   Specimen Description URINE, CATHETERIZED   Final   Special Requests NONE   Final   Culture  Setup Time 12/18/2012 19:36   Final   Colony Count NO GROWTH   Final   Culture NO GROWTH   Final   Report Status 12/19/2012 FINAL   Final  CULTURE, BLOOD (ROUTINE X 2)     Status: None   Collection Time    12/18/12  6:00 PM      Result Value Range Status   Specimen Description BLOOD RIGHT FOREARM  4 ML IN [facility] BOTTLE   Final  Special Requests Immunocompromised   Final   Culture  Setup Time 12/18/2012 23:01   Final   Culture     Final   Value:        BLOOD CULTURE RECEIVED NO GROWTH TO DATE  CULTURE WILL BE HELD FOR 5 DAYS BEFORE ISSUING A FINAL NEGATIVE REPORT   Report Status PENDING   Incomplete  CULTURE, BLOOD (ROUTINE X 2)     Status: None   Collection Time    12/18/12  6:10 PM      Result Value Range Status   Specimen Description BLOOD RIGHT HAND  4 ML IN [facility] BOTTLE   Final   Special Requests Immunocompromised ON PREDNISONE 5 MG DAILY   Final   Culture  Setup Time 12/18/2012 23:01   Final   Culture     Final   Value:        BLOOD CULTURE RECEIVED NO GROWTH TO DATE CULTURE WILL BE HELD FOR 5 DAYS BEFORE ISSUING A FINAL NEGATIVE REPORT   Report Status PENDING   Incomplete  CLOSTRIDIUM DIFFICILE BY PCR     Status: Abnormal   Collection Time    12/21/12  9:21 AM      Result Value Range Status   C difficile by pcr POSITIVE (*) NEGATIVE Final   Comment: CRITICAL RESULT CALLED TO, READ BACK BY AND VERIFIED WITH:     THOMAS C RN 12/21/12 1156 COSTELLO B     Labs: Basic Metabolic Panel:  Recent Labs Lab 12/18/12 1115 12/19/12 0458 12/20/12 0430 12/21/12 0359 12/22/12 0350  NA 139 139 140 140 137  K 3.6 3.5 3.5 3.2* 3.1*  CL 100 105 107 107 101  CO2 29 26 26 26 29   GLUCOSE 114* 90 105* 93 99  BUN 26* 17 14 10 9   CREATININE 1.32* 1.19* 1.25* 1.04 1.13*  CALCIUM 10.0 8.6 8.5 8.3* 8.9   Liver Function Tests: No results found for this basename: AST, ALT, ALKPHOS, BILITOT, PROT, ALBUMIN,  in the last 168 hours No results found for this basename: LIPASE, AMYLASE,  in the last 168 hours No results found for this basename: AMMONIA,  in the last 168 hours CBC:  Recent Labs Lab 12/18/12 1115 12/19/12 0458 12/20/12 0430 12/21/12 0359 12/22/12 0350  WBC 21.1* 13.2* 8.6 8.7 9.2  NEUTROABS 17.9*  --   --   --   --   HGB 11.9* 10.5* 9.9* 10.0* 10.5*  HCT 36.7 32.2* 30.0* 29.6* 31.4*  MCV 86.2 87.7 86.5 84.8 83.5  PLT 159 142* 132* 144* 173   Cardiac Enzymes:  Recent Labs Lab 12/18/12 1115  TROPONINI <0.30   BNP: BNP (last 3 results)  Recent Labs   03/17/12 0530 03/18/12 0400 12/21/12 0359  PROBNP 1429.0* 3700.0* 1164.0*   CBG: No results found for this basename: GLUCAP,  in the last 168 hours  Time coordinating discharge: Over 30 minutes  Signed:  Manson Passey, MD  [facility]  12/22/2012, 10:22 AM  Pager #: [phone]

## 2012-12-22 NOTE — Progress Notes (Signed)
Patient ID: [name], female   DOB: 05/08/1930, 79 y.o.   MRN: [id number]         [facility]    Date of Admission:  12/18/2012           Day 2 oral vancomycin   Patient Active Problem List   Diagnosis Date Noted  . Sepsis 12/19/2012  . UTI (lower urinary tract infection) 07/06/2012  . Hyperkalemia 07/06/2012  . Back pain, acute 07/06/2012  . Anemia 06/03/2012  . Recurrent colitis due to Clostridium difficile 06/02/2012  . Leukocytosis 06/01/2012  . Hypertension 06/01/2012  . Coronary atherosclerosis of native coronary artery 04/01/2012  . Long term (current) use of anticoagulants 04/01/2012  . Adrenal insufficiency 12/08/2011    Class: Chronic  . Stroke 12/07/2011    Class: Acute  . Dysphagia 12/07/2011    Class: Acute  . Protein-calorie malnutrition, moderate 12/07/2011    Class: Acute  . Gait instability 12/07/2011    Class: Acute  . SOB (shortness of breath)   . MI (myocardial infarction)   . Arthritis   . Renal insufficiency   . RA (rheumatoid arthritis)   . PUD (peptic ulcer disease)   . GI bleed   . GERD (gastroesophageal reflux disease)   . Hiatal hernia   . Hypothyroidism   . Osteoporosis   . Depression   . Anxiety     Subjective: She has had some mild intermittent nausea without any vomiting. She has not had any further diarrhea or abdominal cramping. She had one soft bowel movement this morning. Is able to eat and tolerate her full breakfast area  Objective: Temp:  [97.5 F (36.4 C)-98.6 F (37 C)] 97.5 F (36.4 C) (06/25 0500) Pulse Rate:  [52-80] 78 (06/25 0954) Resp:  [18-20] 20 (06/25 0954) BP: (133-151)/(65-75) 138/69 mmHg (06/25 0954) SpO2:  [97 %-98 %] 98 % (06/25 0500)  General: She is in good spirits and in no distress Abdomen: Soft and nontender with quiet bowel sounds   Lab Results Lab Results  Component Value Date   WBC 9.2 12/22/2012   HGB 10.5* 12/22/2012   HCT 31.4* 12/22/2012   MCV 83.5  12/22/2012   PLT 173 12/22/2012    Lab Results  Component Value Date   CREATININE 1.13* 12/22/2012   BUN 9 12/22/2012   NA 137 12/22/2012   K 3.1* 12/22/2012   CL 101 12/22/2012   CO2 29 12/22/2012    Assessment: She has mild, recurrent C. difficile colitis.  Plan: 1. Continue oral vancomycin for 14 days total 2. Please call if I can be of further assistance  Cliffton Asters, MD [facility] [phone] pager   [phone] cell 12/22/2012, 11:18 AM

## 2012-12-24 LAB — CULTURE, BLOOD (ROUTINE X 2): Culture: NO GROWTH

## 2012-12-27 ENCOUNTER — Non-Acute Institutional Stay (SKILLED_NURSING_FACILITY): Payer: Medicare Other | Admitting: Internal Medicine

## 2012-12-27 DIAGNOSIS — A0472 Enterocolitis due to Clostridium difficile, not specified as recurrent: Secondary | ICD-10-CM

## 2012-12-27 DIAGNOSIS — K449 Diaphragmatic hernia without obstruction or gangrene: Secondary | ICD-10-CM | POA: Diagnosis not present

## 2012-12-27 DIAGNOSIS — D649 Anemia, unspecified: Secondary | ICD-10-CM

## 2012-12-27 DIAGNOSIS — E039 Hypothyroidism, unspecified: Secondary | ICD-10-CM

## 2012-12-27 DIAGNOSIS — K219 Gastro-esophageal reflux disease without esophagitis: Secondary | ICD-10-CM

## 2012-12-27 DIAGNOSIS — M069 Rheumatoid arthritis, unspecified: Secondary | ICD-10-CM

## 2012-12-27 DIAGNOSIS — I10 Essential (primary) hypertension: Secondary | ICD-10-CM

## 2012-12-27 NOTE — Progress Notes (Signed)
Patient ID: [name], female   DOB: [birth year], 79 y.o.   MRN: [id number]   Code Status: full code  Allergies  Allergen Reactions  . Codeine     sick  . Morphine And Related Other (See Comments)    unknown  . Percocet (Oxycodone-Acetaminophen)     unknown  . Plavix (Clopidogrel Bisulfate) Other (See Comments)    unknown  . Sulfur Other (See Comments)    unknown    Chief Complaint: re-admit post hospitalization 12/18/12- 12/22/12  HPI:  79 y/o female patient with history of CAD, CVA, HLD, HTN, RA, adrenal insufficiency among others is a long term resident of the facility. She was admitted to hospital with fever, abdominal pain and diarrhea. She was diagnosed to have c.diff  Colitis and was started on po vancomycin for 2 weeks. Her chronic medical issues were followed. She was sent back to the facility Patient was seen in her room today. Her diarrhea has resolved. Denies any abdominal pain. No other complaints  Review of Systems:  Denies fever or chills No nausea or vomiting or abdominal pain No chest pain or SOB No constipation Appetite is fair  Past Medical History  Diagnosis Date  . SOB (shortness of breath)   . MI (myocardial infarction)   . Poor appetite   . Arthritis   . Fatigue   . Right ear pain   . Renal insufficiency   . RA (rheumatoid arthritis)   . PUD (peptic ulcer disease)   . GI bleed 2005  . GERD (gastroesophageal reflux disease)   . Hiatal hernia   . Hypothyroidism   . Osteoporosis   . Depression   . Anxiety   . Coronary artery disease   . CVA (cerebral vascular accident)   . TIA (transient ischemic attack)   . Fall   . DVT of lower extremity (deep venous thrombosis)    Past Surgical History  Procedure Laterality Date  . Nstemi  06/2010  . Spinal fusion surgery    . Knee surgery    . Cardiac catheterization      SHOWED RUPTURE PLAQUE IN THE LAD. THE LAD IS NONOBSTRUCTIVE WITH ONLY 30-40% STENOSIS   Social History:   reports that she has  never smoked. She has never used smokeless tobacco. She reports that she does not drink alcohol or use illicit drugs.  Family History  Problem Relation Age of Onset  . Kidney disease Mother   . Kidney disease Brother     Medications: Patient's Medications  New Prescriptions   No medications on file  Previous Medications   ACETAMINOPHEN (TYLENOL) 650 MG SUPPOSITORY    Place 650 mg rectally every 4 (four) hours as needed. Pain   AMLODIPINE (NORVASC) 10 MG TABLET    Take 1 tablet (10 mg total) by mouth daily.   ASCORBIC ACID (VITAMIN C PO)    Take 500 mg by mouth daily.    ASPIRIN 81 MG CHEWABLE TABLET    Chew 81 mg by mouth daily.   BACLOFEN (LIORESAL) 10 MG TABLET    Take 10 mg by mouth 3 (three) times daily - between meals and at bedtime.   ESOMEPRAZOLE (NEXIUM) 40 MG CAPSULE    Take 40 mg by mouth daily before breakfast.    FERROUS SULFATE 325 (65 FE) MG TABLET    Take 325 mg by mouth daily with breakfast.   FOLIC ACID-VIT B6-VIT B12 (FOLBEE) 2.5-25-1 MG TABS    Take 1 tablet by mouth daily.  HYDROCODONE-ACETAMINOPHEN (VICODIN) 5-500 MG PER TABLET    Take 1 tablet by mouth at bedtime. 1 tablet every 6 hrs as needed for pain   LEVOTHYROXINE (SYNTHROID, LEVOTHROID) 50 MCG TABLET    Take 50 mcg by mouth daily.    LORAZEPAM (ATIVAN) 0.5 MG TABLET    Take 1 tablet every night at bedtime as needed for anxiety   METOPROLOL (LOPRESSOR) 50 MG TABLET    Take 50 mg by mouth 2 (two) times daily.    MULTIPLE VITAMIN (MULTIVITAMIN) TABLET    Take 1 tablet by mouth daily.     NON FORMULARY    Take 1 tablet by mouth daily. FOLIC ACID 1 MG/VITAMIN B 9   OMEGA-3 FATTY ACIDS (FISH OIL) 1200 MG CAPS    Take 1,200 mg by mouth daily.   POLYETHYLENE GLYCOL (MIRALAX / GLYCOLAX) PACKET    Take 17 g by mouth daily as needed.   PREDNISONE (DELTASONE) 10 MG TABLET    Take 10 mg by mouth daily.   PROMETHAZINE (PHENERGAN) 25 MG TABLET    Take 25 mg by mouth every 6 (six) hours as needed. nausea   ROSUVASTATIN  (CRESTOR) 20 MG TABLET    Take 20 mg by mouth every evening.    SACCHAROMYCES BOULARDII (FLORASTOR) 250 MG CAPSULE    Take 1 capsule (250 mg total) by mouth 2 (two) times daily.   SERTRALINE (ZOLOFT) 50 MG TABLET    Take 1 tablet (50 mg total) by mouth every evening.   VANCOMYCIN (VANCOCIN) 50 MG/ML ORAL SOLUTION    Take 2.5 mLs (125 mg total) by mouth 4 (four) times daily.  Modified Medications   No medications on file  Discontinued Medications   No medications on file     Physical Exam: Filed Vitals:   12/27/12 1839  BP: 141/72  Pulse: 84  Temp: 97.5 F (36.4 C)  Resp: 16  SpO2: 96%   General:  is alert and oriented and in no acute distress HEENT: MMM, no LAD, PERRLA Cardiovascular: Regular rate and rhythm, S1/S2 +, no murmurs, no rubs, no gallops Respiratory: Clear to auscultation bilaterally, no wheezing, no crackles, no rhonchi Abdominal: Soft, non tender, non distended, bowel sounds +, no guarding Extremities: no edema, no cyanosis, pulses palpable bilaterally DP and PT Neuro: Grossly nonfocal  In hospital consultation- ID Labs reviewed: Basic Metabolic Panel:  Recent Labs  16/10/96 0530 03/18/12 0343  12/20/12 0430 12/21/12 0359 12/22/12 0350  NA 140 141  < > 140 140 137  K 4.2 3.5  < > 3.5 3.2* 3.1*  CL 108 109  < > 107 107 101  CO2 23 24  < > 26 26 29   GLUCOSE 135* 95  < > 105* 93 99  BUN 32* 30*  < > 14 10 9   CREATININE 1.19* 1.09  < > 1.25* 1.04 1.13*  CALCIUM 8.1* 8.3*  < > 8.5 8.3* 8.9  MG 1.7 1.7  --   --   --   --   PHOS 2.6 2.9  --   --   --   --   < > = values in this interval not displayed. Liver Function Tests:  Recent Labs  03/31/12 1400 04/01/12 0324 04/05/12 0422  AST 16 16 16   ALT 15 15 17   ALKPHOS 59 51 55  BILITOT 0.3 0.3 0.4  PROT 6.0 5.6* 5.8*  ALBUMIN 2.8* 2.4* 2.7*   No results found for this basename: LIPASE, AMYLASE,  in the last  8760 hours No results found for this basename: AMMONIA,  in the last 8760  hours CBC:  Recent Labs  04/04/12 1709 06/01/12 1245  12/18/12 1115  12/20/12 0430 12/21/12 0359 12/22/12 0350  WBC 10.6* 19.1*  < > 21.1*  < > 8.6 8.7 9.2  NEUTROABS 7.1 16.1*  --  17.9*  --   --   --   --   HGB 9.9* 10.2*  < > 11.9*  < > 9.9* 10.0* 10.5*  HCT 31.4* 32.3*  < > 36.7  < > 30.0* 29.6* 31.4*  MCV 84.4 86.4  < > 86.2  < > 86.5 84.8 83.5  PLT 211 186  < > 159  < > 132* 144* 173  < > = values in this interval not displayed. Cardiac Enzymes:  Recent Labs  04/01/12 0122 04/01/12 0720 04/01/12 1414 12/18/12 1115  CKTOTAL  --  239* 209*  --   CKMB  --  10.5* 8.9*  --   TROPONINI 0.41* 0.39*  --  <0.30   BNP: No components found with this basename: POCBNP,  CBG:  Recent Labs  04/04/12 0754 04/05/12 0748 04/05/12 1149  GLUCAP 80 112* 145*    Radiological Exams:  Ct Abdomen Pelvis Wo Contrast  12/18/2012   *RADIOLOGY REPORT*  Clinical Data: lower quadrant pain.  Leukocytosis.  CT ABDOMEN AND PELVIS WITHOUT CONTRAST  Technique:  Multidetector CT imaging of the abdomen and pelvis was performed following the standard protocol without intravenous contrast.  Comparison:  03/16/2012  Findings: Pleural thickening is noted in the medial right lung base.  There is mild subsegmental atelectasis within both lower lobes.  No focal liver abnormalities identified.  The gallbladder appears normal.  No biliary dilatation.  Normal appearance of the pancreas.  Calcified granuloma noted within the spleen.  The adrenal glands are both normal.  Bilateral renal cortical thinning is identified.  No hydronephrosis noted.  No nephrolithiasis.  The urinary bladder appears normal.  Previous hysterectomy.  Normal caliber of the abdominal aorta.  There is no aneurysm.  No enlarged upper abdominal lymph nodes.  There is no pelvic or inguinal adenopathy noted.  Large hiatal hernia noted.  The small bowel loops are within normal limits.  Scattered colonic diverticula noted.  No evidence for acute  colonic inflammation.  There is no free fluid or abnormal fluid collections identified.  Review of the visualized bony structures is remarkable for osteopenia.  The patient is status post hardware fixation of the thoracic and lumbar spine. Again noted are compression deformities involving the T10 and T12 vertebra. Chronic right superior and inferior pubic rami fractures are again identified.  IMPRESSION:  1.  No acute findings identified within the abdomen or pelvis. 2.  Large hiatal hernia.   Original Report Authenticated By: Signa Kell, M.D.   Dg Chest 2 View  12/18/2012   *RADIOLOGY REPORT*  Clinical Data: chest pain  CHEST - 2 VIEW  Comparison: 06/01/2012  Findings: Mild to moderate cardiac enlargement.  No effusion or edema.  The lung volumes are low.  Scar or plate- like atelectasis is noted in the left base.  No airspace consolidation.  Previous hardware fixation of the thoracic and lumbar spine.  Compression deformity within the lower thoracic spine appears similar to previous exam.  IMPRESSION: 1.  Low lung volumes. 2.  Scar versus plate-like atelectasis present in the left base.   Original Report Authenticated By: Signa Kell, M.D.   Assessment/Plan  C.diff colitis- complete course of vancomycin  for 2 weeks. Monitor for loose stools. Check bmp. Also to continue florasotr  RA- continue long term prednisone course and pain medications  Hiatal hernia- symptoms of reflux under control, continue nexium for now  Hypothyroidism- continue levothyroxine for now, monitor clinically  HTN- bp remains stable. Continue asa with amlodipine and metoprolol  Chronic back pain- continue current pain regimen  Anemia- continue iron supplement and folic acid  Labs/tests ordered- cbc , cmp

## 2012-12-28 DIAGNOSIS — I2510 Atherosclerotic heart disease of native coronary artery without angina pectoris: Secondary | ICD-10-CM | POA: Diagnosis present

## 2012-12-28 DIAGNOSIS — K449 Diaphragmatic hernia without obstruction or gangrene: Secondary | ICD-10-CM | POA: Diagnosis present

## 2012-12-28 DIAGNOSIS — G8929 Other chronic pain: Secondary | ICD-10-CM | POA: Diagnosis present

## 2012-12-28 DIAGNOSIS — F411 Generalized anxiety disorder: Secondary | ICD-10-CM | POA: Diagnosis not present

## 2012-12-28 DIAGNOSIS — IMO0002 Reserved for concepts with insufficient information to code with codable children: Secondary | ICD-10-CM | POA: Diagnosis not present

## 2012-12-28 DIAGNOSIS — R5383 Other fatigue: Secondary | ICD-10-CM | POA: Diagnosis not present

## 2012-12-28 DIAGNOSIS — I10 Essential (primary) hypertension: Secondary | ICD-10-CM | POA: Diagnosis not present

## 2012-12-28 DIAGNOSIS — D649 Anemia, unspecified: Secondary | ICD-10-CM | POA: Diagnosis not present

## 2012-12-28 DIAGNOSIS — D631 Anemia in chronic kidney disease: Secondary | ICD-10-CM | POA: Diagnosis present

## 2012-12-28 DIAGNOSIS — R112 Nausea with vomiting, unspecified: Secondary | ICD-10-CM | POA: Diagnosis not present

## 2012-12-28 DIAGNOSIS — J189 Pneumonia, unspecified organism: Secondary | ICD-10-CM | POA: Diagnosis present

## 2012-12-28 DIAGNOSIS — I6990 Unspecified sequelae of unspecified cerebrovascular disease: Secondary | ICD-10-CM | POA: Diagnosis not present

## 2012-12-28 DIAGNOSIS — M810 Age-related osteoporosis without current pathological fracture: Secondary | ICD-10-CM | POA: Diagnosis present

## 2012-12-28 DIAGNOSIS — M6281 Muscle weakness (generalized): Secondary | ICD-10-CM | POA: Diagnosis not present

## 2012-12-28 DIAGNOSIS — Z9181 History of falling: Secondary | ICD-10-CM | POA: Diagnosis not present

## 2012-12-28 DIAGNOSIS — I129 Hypertensive chronic kidney disease with stage 1 through stage 4 chronic kidney disease, or unspecified chronic kidney disease: Secondary | ICD-10-CM | POA: Diagnosis present

## 2012-12-28 DIAGNOSIS — A419 Sepsis, unspecified organism: Secondary | ICD-10-CM | POA: Diagnosis not present

## 2012-12-28 DIAGNOSIS — E2749 Other adrenocortical insufficiency: Secondary | ICD-10-CM | POA: Diagnosis present

## 2012-12-28 DIAGNOSIS — K219 Gastro-esophageal reflux disease without esophagitis: Secondary | ICD-10-CM | POA: Diagnosis not present

## 2012-12-28 DIAGNOSIS — R110 Nausea: Secondary | ICD-10-CM | POA: Diagnosis not present

## 2012-12-28 DIAGNOSIS — I82409 Acute embolism and thrombosis of unspecified deep veins of unspecified lower extremity: Secondary | ICD-10-CM | POA: Diagnosis not present

## 2012-12-28 DIAGNOSIS — Z791 Long term (current) use of non-steroidal anti-inflammatories (NSAID): Secondary | ICD-10-CM | POA: Diagnosis not present

## 2012-12-28 DIAGNOSIS — E039 Hypothyroidism, unspecified: Secondary | ICD-10-CM | POA: Diagnosis not present

## 2012-12-28 DIAGNOSIS — I69959 Hemiplegia and hemiparesis following unspecified cerebrovascular disease affecting unspecified side: Secondary | ICD-10-CM | POA: Diagnosis not present

## 2012-12-28 DIAGNOSIS — M069 Rheumatoid arthritis, unspecified: Secondary | ICD-10-CM | POA: Diagnosis present

## 2012-12-28 DIAGNOSIS — B351 Tinea unguium: Secondary | ICD-10-CM | POA: Diagnosis not present

## 2012-12-28 DIAGNOSIS — S37009A Unspecified injury of unspecified kidney, initial encounter: Secondary | ICD-10-CM | POA: Diagnosis not present

## 2012-12-28 DIAGNOSIS — R197 Diarrhea, unspecified: Secondary | ICD-10-CM | POA: Diagnosis not present

## 2012-12-28 DIAGNOSIS — F329 Major depressive disorder, single episode, unspecified: Secondary | ICD-10-CM | POA: Diagnosis present

## 2012-12-28 DIAGNOSIS — Z8673 Personal history of transient ischemic attack (TIA), and cerebral infarction without residual deficits: Secondary | ICD-10-CM | POA: Diagnosis not present

## 2012-12-28 DIAGNOSIS — E785 Hyperlipidemia, unspecified: Secondary | ICD-10-CM | POA: Diagnosis not present

## 2012-12-28 DIAGNOSIS — R1310 Dysphagia, unspecified: Secondary | ICD-10-CM | POA: Diagnosis present

## 2012-12-28 DIAGNOSIS — N183 Chronic kidney disease, stage 3 unspecified: Secondary | ICD-10-CM | POA: Diagnosis present

## 2012-12-28 DIAGNOSIS — N179 Acute kidney failure, unspecified: Secondary | ICD-10-CM | POA: Diagnosis not present

## 2012-12-28 DIAGNOSIS — I252 Old myocardial infarction: Secondary | ICD-10-CM | POA: Diagnosis not present

## 2012-12-28 DIAGNOSIS — A0472 Enterocolitis due to Clostridium difficile, not specified as recurrent: Secondary | ICD-10-CM | POA: Diagnosis not present

## 2012-12-28 DIAGNOSIS — R6889 Other general symptoms and signs: Secondary | ICD-10-CM | POA: Diagnosis not present

## 2012-12-28 DIAGNOSIS — E860 Dehydration: Secondary | ICD-10-CM | POA: Diagnosis present

## 2012-12-28 DIAGNOSIS — D72828 Other elevated white blood cell count: Secondary | ICD-10-CM | POA: Diagnosis not present

## 2012-12-28 DIAGNOSIS — M62838 Other muscle spasm: Secondary | ICD-10-CM | POA: Diagnosis not present

## 2012-12-28 DIAGNOSIS — R262 Difficulty in walking, not elsewhere classified: Secondary | ICD-10-CM | POA: Diagnosis not present

## 2012-12-28 DIAGNOSIS — E876 Hypokalemia: Secondary | ICD-10-CM | POA: Diagnosis present

## 2013-01-04 DIAGNOSIS — A0472 Enterocolitis due to Clostridium difficile, not specified as recurrent: Secondary | ICD-10-CM | POA: Insufficient documentation

## 2013-01-04 HISTORY — DX: Enterocolitis due to Clostridium difficile, not specified as recurrent: A04.72

## 2013-01-18 ENCOUNTER — Other Ambulatory Visit: Payer: Self-pay | Admitting: Geriatric Medicine

## 2013-01-18 MED ORDER — HYDROCODONE-ACETAMINOPHEN 5-325 MG PO TABS: ORAL_TABLET | ORAL | Status: DC

## 2013-01-22 ENCOUNTER — Other Ambulatory Visit: Payer: Self-pay

## 2013-01-22 ENCOUNTER — Inpatient Hospital Stay (HOSPITAL_COMMUNITY)
Admission: EM | Admit: 2013-01-22 | Discharge: 2013-01-25 | DRG: 871 | Disposition: A | Discharge status: Skilled Nursing Facility | Payer: Medicare Other | Attending: Internal Medicine | Admitting: Internal Medicine

## 2013-01-22 ENCOUNTER — Encounter (HOSPITAL_COMMUNITY): Payer: Self-pay | Admitting: Emergency Medicine

## 2013-01-22 DIAGNOSIS — K279 Peptic ulcer, site unspecified, unspecified as acute or chronic, without hemorrhage or perforation: Secondary | ICD-10-CM

## 2013-01-22 DIAGNOSIS — N179 Acute kidney failure, unspecified: Secondary | ICD-10-CM | POA: Diagnosis present

## 2013-01-22 DIAGNOSIS — F329 Major depressive disorder, single episode, unspecified: Secondary | ICD-10-CM | POA: Diagnosis present

## 2013-01-22 DIAGNOSIS — R5383 Other fatigue: Secondary | ICD-10-CM | POA: Diagnosis not present

## 2013-01-22 DIAGNOSIS — S37009A Unspecified injury of unspecified kidney, initial encounter: Secondary | ICD-10-CM | POA: Diagnosis not present

## 2013-01-22 DIAGNOSIS — D72828 Other elevated white blood cell count: Secondary | ICD-10-CM | POA: Diagnosis not present

## 2013-01-22 DIAGNOSIS — F411 Generalized anxiety disorder: Secondary | ICD-10-CM | POA: Diagnosis present

## 2013-01-22 DIAGNOSIS — D649 Anemia, unspecified: Secondary | ICD-10-CM

## 2013-01-22 DIAGNOSIS — A0472 Enterocolitis due to Clostridium difficile, not specified as recurrent: Secondary | ICD-10-CM | POA: Diagnosis present

## 2013-01-22 DIAGNOSIS — F3289 Other specified depressive episodes: Secondary | ICD-10-CM | POA: Diagnosis present

## 2013-01-22 DIAGNOSIS — A419 Sepsis, unspecified organism: Secondary | ICD-10-CM | POA: Diagnosis not present

## 2013-01-22 DIAGNOSIS — D72829 Elevated white blood cell count, unspecified: Secondary | ICD-10-CM

## 2013-01-22 DIAGNOSIS — R0602 Shortness of breath: Secondary | ICD-10-CM | POA: Diagnosis not present

## 2013-01-22 DIAGNOSIS — E860 Dehydration: Secondary | ICD-10-CM | POA: Diagnosis not present

## 2013-01-22 DIAGNOSIS — I129 Hypertensive chronic kidney disease with stage 1 through stage 4 chronic kidney disease, or unspecified chronic kidney disease: Secondary | ICD-10-CM | POA: Diagnosis present

## 2013-01-22 DIAGNOSIS — R6889 Other general symptoms and signs: Secondary | ICD-10-CM | POA: Diagnosis not present

## 2013-01-22 DIAGNOSIS — IMO0002 Reserved for concepts with insufficient information to code with codable children: Secondary | ICD-10-CM

## 2013-01-22 DIAGNOSIS — N289 Disorder of kidney and ureter, unspecified: Secondary | ICD-10-CM

## 2013-01-22 DIAGNOSIS — R1314 Dysphagia, pharyngoesophageal phase: Secondary | ICD-10-CM | POA: Diagnosis not present

## 2013-01-22 DIAGNOSIS — E039 Hypothyroidism, unspecified: Secondary | ICD-10-CM | POA: Diagnosis present

## 2013-01-22 DIAGNOSIS — F419 Anxiety disorder, unspecified: Secondary | ICD-10-CM

## 2013-01-22 DIAGNOSIS — M810 Age-related osteoporosis without current pathological fracture: Secondary | ICD-10-CM | POA: Diagnosis present

## 2013-01-22 DIAGNOSIS — R112 Nausea with vomiting, unspecified: Secondary | ICD-10-CM

## 2013-01-22 DIAGNOSIS — J9819 Other pulmonary collapse: Secondary | ICD-10-CM | POA: Diagnosis not present

## 2013-01-22 DIAGNOSIS — E2740 Unspecified adrenocortical insufficiency: Secondary | ICD-10-CM

## 2013-01-22 DIAGNOSIS — N189 Chronic kidney disease, unspecified: Secondary | ICD-10-CM | POA: Diagnosis present

## 2013-01-22 DIAGNOSIS — R5381 Other malaise: Secondary | ICD-10-CM | POA: Diagnosis not present

## 2013-01-22 DIAGNOSIS — R1310 Dysphagia, unspecified: Secondary | ICD-10-CM | POA: Diagnosis present

## 2013-01-22 DIAGNOSIS — M069 Rheumatoid arthritis, unspecified: Secondary | ICD-10-CM

## 2013-01-22 DIAGNOSIS — I252 Old myocardial infarction: Secondary | ICD-10-CM | POA: Diagnosis not present

## 2013-01-22 DIAGNOSIS — E785 Hyperlipidemia, unspecified: Secondary | ICD-10-CM | POA: Diagnosis not present

## 2013-01-22 DIAGNOSIS — Z791 Long term (current) use of non-steroidal anti-inflammatories (NSAID): Secondary | ICD-10-CM

## 2013-01-22 DIAGNOSIS — E876 Hypokalemia: Secondary | ICD-10-CM

## 2013-01-22 DIAGNOSIS — R531 Weakness: Secondary | ICD-10-CM

## 2013-01-22 DIAGNOSIS — M1990 Unspecified osteoarthritis, unspecified site: Secondary | ICD-10-CM

## 2013-01-22 DIAGNOSIS — K219 Gastro-esophageal reflux disease without esophagitis: Secondary | ICD-10-CM | POA: Diagnosis present

## 2013-01-22 DIAGNOSIS — I6990 Unspecified sequelae of unspecified cerebrovascular disease: Secondary | ICD-10-CM | POA: Diagnosis not present

## 2013-01-22 DIAGNOSIS — I2510 Atherosclerotic heart disease of native coronary artery without angina pectoris: Secondary | ICD-10-CM | POA: Diagnosis present

## 2013-01-22 DIAGNOSIS — Z7901 Long term (current) use of anticoagulants: Secondary | ICD-10-CM

## 2013-01-22 DIAGNOSIS — N183 Chronic kidney disease, stage 3 unspecified: Secondary | ICD-10-CM | POA: Diagnosis present

## 2013-01-22 DIAGNOSIS — Z8673 Personal history of transient ischemic attack (TIA), and cerebral infarction without residual deficits: Secondary | ICD-10-CM

## 2013-01-22 DIAGNOSIS — N390 Urinary tract infection, site not specified: Secondary | ICD-10-CM

## 2013-01-22 DIAGNOSIS — Z9181 History of falling: Secondary | ICD-10-CM | POA: Diagnosis not present

## 2013-01-22 DIAGNOSIS — R1312 Dysphagia, oropharyngeal phase: Secondary | ICD-10-CM | POA: Diagnosis not present

## 2013-01-22 DIAGNOSIS — R2681 Unsteadiness on feet: Secondary | ICD-10-CM

## 2013-01-22 DIAGNOSIS — D631 Anemia in chronic kidney disease: Secondary | ICD-10-CM | POA: Diagnosis present

## 2013-01-22 DIAGNOSIS — K449 Diaphragmatic hernia without obstruction or gangrene: Secondary | ICD-10-CM

## 2013-01-22 DIAGNOSIS — E2749 Other adrenocortical insufficiency: Secondary | ICD-10-CM | POA: Diagnosis present

## 2013-01-22 DIAGNOSIS — I10 Essential (primary) hypertension: Secondary | ICD-10-CM | POA: Diagnosis not present

## 2013-01-22 DIAGNOSIS — J189 Pneumonia, unspecified organism: Secondary | ICD-10-CM | POA: Diagnosis not present

## 2013-01-22 DIAGNOSIS — I639 Cerebral infarction, unspecified: Secondary | ICD-10-CM

## 2013-01-22 DIAGNOSIS — R197 Diarrhea, unspecified: Secondary | ICD-10-CM | POA: Diagnosis not present

## 2013-01-22 DIAGNOSIS — G8929 Other chronic pain: Secondary | ICD-10-CM | POA: Diagnosis present

## 2013-01-22 DIAGNOSIS — F32A Depression, unspecified: Secondary | ICD-10-CM

## 2013-01-22 DIAGNOSIS — E875 Hyperkalemia: Secondary | ICD-10-CM

## 2013-01-22 DIAGNOSIS — M549 Dorsalgia, unspecified: Secondary | ICD-10-CM

## 2013-01-22 DIAGNOSIS — I69959 Hemiplegia and hemiparesis following unspecified cerebrovascular disease affecting unspecified side: Secondary | ICD-10-CM | POA: Diagnosis not present

## 2013-01-22 DIAGNOSIS — M62838 Other muscle spasm: Secondary | ICD-10-CM | POA: Diagnosis not present

## 2013-01-22 DIAGNOSIS — I219 Acute myocardial infarction, unspecified: Secondary | ICD-10-CM

## 2013-01-22 DIAGNOSIS — E440 Moderate protein-calorie malnutrition: Secondary | ICD-10-CM

## 2013-01-22 DIAGNOSIS — A0471 Enterocolitis due to Clostridium difficile, recurrent: Secondary | ICD-10-CM | POA: Diagnosis present

## 2013-01-22 DIAGNOSIS — N039 Chronic nephritic syndrome with unspecified morphologic changes: Secondary | ICD-10-CM | POA: Diagnosis present

## 2013-01-22 DIAGNOSIS — R110 Nausea: Secondary | ICD-10-CM | POA: Diagnosis not present

## 2013-01-22 DIAGNOSIS — K922 Gastrointestinal hemorrhage, unspecified: Secondary | ICD-10-CM

## 2013-01-22 HISTORY — DX: Pneumonia, unspecified organism: J18.9

## 2013-01-22 LAB — URINALYSIS, COMPLETE
Blood: NEGATIVE
Ketone: NEGATIVE
Nitrite: NEGATIVE
Ph: 9 (ref 4.5–8.0)
RBC,UR: 373 /HPF (ref 0–5)
Specific Gravity: 1.017 (ref 1.003–1.030)
Squamous Epithelial: NONE SEEN

## 2013-01-22 LAB — CBC WITH DIFFERENTIAL/PLATELET
Basophil #: 0.1 10*3/uL (ref 0.0–0.1)
Eosinophil #: 0.2 10*3/uL (ref 0.0–0.7)
HGB: 11.2 g/dL — ABNORMAL LOW (ref 12.0–16.0)
Lymphocyte #: 1.2 10*3/uL (ref 1.0–3.6)
Lymphocyte %: 6.5 %
MCH: 28.5 pg (ref 26.0–34.0)
MCHC: 33.1 g/dL (ref 32.0–36.0)
MCV: 86 fL (ref 80–100)
Neutrophil #: 16 10*3/uL — ABNORMAL HIGH (ref 1.4–6.5)
Neutrophil %: 88 %
Platelet: 205 10*3/uL (ref 150–440)
RBC: 3.91 10*6/uL (ref 3.80–5.20)
RDW: 15 % — ABNORMAL HIGH (ref 11.5–14.5)
WBC: 18.2 10*3/uL — ABNORMAL HIGH (ref 3.6–11.0)

## 2013-01-22 LAB — BASIC METABOLIC PANEL
Anion Gap: 12 (ref 7–16)
Calcium, Total: 8.9 mg/dL (ref 8.5–10.1)
Creatinine: 1.65 mg/dL — ABNORMAL HIGH (ref 0.60–1.30)
EGFR (African American): 34 — ABNORMAL LOW
EGFR (Non-African Amer.): 29 — ABNORMAL LOW
Glucose: 141 mg/dL — ABNORMAL HIGH (ref 65–99)
Osmolality: 288 (ref 275–301)
Sodium: 141 mmol/L (ref 136–145)

## 2013-01-22 MED ORDER — DEXTROSE 5 % IV SOLN
1.0000 g | Freq: Three times a day (TID) | INTRAVENOUS | Status: DC
  Administered 2013-01-23: 1 g via INTRAVENOUS
  Filled 2013-01-22 (×3): qty 1

## 2013-01-22 MED ORDER — HYDROCODONE-ACETAMINOPHEN 5-325 MG PO TABS
1.0000 | ORAL_TABLET | Freq: Four times a day (QID) | ORAL | Status: DC | PRN
  Administered 2013-01-23 – 2013-01-24 (×3): 1 via ORAL
  Filled 2013-01-22 (×3): qty 1

## 2013-01-22 MED ORDER — SODIUM CHLORIDE 0.9 % IV SOLN
INTRAVENOUS | Status: DC
  Administered 2013-01-23 – 2013-01-25 (×2): via INTRAVENOUS

## 2013-01-22 MED ORDER — PREDNISONE 10 MG PO TABS
10.0000 mg | ORAL_TABLET | Freq: Every day | ORAL | Status: DC
  Administered 2013-01-23 – 2013-01-25 (×3): 10 mg via ORAL
  Filled 2013-01-22 (×3): qty 1

## 2013-01-22 MED ORDER — PROMETHAZINE HCL 25 MG PO TABS
25.0000 mg | ORAL_TABLET | Freq: Four times a day (QID) | ORAL | Status: DC | PRN
  Filled 2013-01-22: qty 1

## 2013-01-22 MED ORDER — ADULT MULTIVITAMIN W/MINERALS CH
1.0000 | ORAL_TABLET | Freq: Every day | ORAL | Status: DC
  Administered 2013-01-23 – 2013-01-25 (×3): 1 via ORAL
  Filled 2013-01-22 (×3): qty 1

## 2013-01-22 MED ORDER — FERROUS SULFATE 325 (65 FE) MG PO TABS
325.0000 mg | ORAL_TABLET | Freq: Every day | ORAL | Status: DC
  Administered 2013-01-23 – 2013-01-25 (×3): 325 mg via ORAL
  Filled 2013-01-22 (×5): qty 1

## 2013-01-22 MED ORDER — VITAMIN C 500 MG PO TABS
500.0000 mg | ORAL_TABLET | Freq: Every day | ORAL | Status: DC
  Administered 2013-01-23 – 2013-01-25 (×3): 500 mg via ORAL
  Filled 2013-01-22 (×3): qty 1

## 2013-01-22 MED ORDER — ATORVASTATIN CALCIUM 40 MG PO TABS
40.0000 mg | ORAL_TABLET | Freq: Every day | ORAL | Status: DC
  Filled 2013-01-22: qty 1

## 2013-01-22 MED ORDER — HEPARIN SODIUM (PORCINE) 5000 UNIT/ML IJ SOLN
5000.0000 [IU] | Freq: Three times a day (TID) | INTRAMUSCULAR | Status: DC
  Administered 2013-01-23 – 2013-01-25 (×7): 5000 [IU] via SUBCUTANEOUS
  Filled 2013-01-22 (×11): qty 1

## 2013-01-22 MED ORDER — SERTRALINE HCL 50 MG PO TABS
50.0000 mg | ORAL_TABLET | Freq: Every morning | ORAL | Status: DC
  Administered 2013-01-23 – 2013-01-25 (×3): 50 mg via ORAL
  Filled 2013-01-22 (×3): qty 1

## 2013-01-22 MED ORDER — LEVOTHYROXINE SODIUM 50 MCG PO TABS
50.0000 ug | ORAL_TABLET | Freq: Every day | ORAL | Status: DC
  Administered 2013-01-23 – 2013-01-25 (×3): 50 ug via ORAL
  Filled 2013-01-22 (×5): qty 1

## 2013-01-22 MED ORDER — METOPROLOL TARTRATE 50 MG PO TABS
50.0000 mg | ORAL_TABLET | Freq: Two times a day (BID) | ORAL | Status: DC
  Administered 2013-01-23 – 2013-01-25 (×5): 50 mg via ORAL
  Filled 2013-01-22 (×6): qty 1

## 2013-01-22 MED ORDER — ACETAMINOPHEN 325 MG PO TABS: 650.0000 mg | ORAL_TABLET | Freq: Four times a day (QID) | ORAL | Status: DC | PRN

## 2013-01-22 MED ORDER — SACCHAROMYCES BOULARDII 250 MG PO CAPS
250.0000 mg | ORAL_CAPSULE | Freq: Two times a day (BID) | ORAL | Status: DC
  Administered 2013-01-23 – 2013-01-25 (×6): 250 mg via ORAL
  Filled 2013-01-22 (×7): qty 1

## 2013-01-22 MED ORDER — FOLIC ACID-VIT B6-VIT B12 2.5-25-1 MG PO TABS
1.0000 | ORAL_TABLET | Freq: Every day | ORAL | Status: DC
  Administered 2013-01-23 – 2013-01-25 (×3): 1 via ORAL
  Filled 2013-01-22 (×3): qty 1

## 2013-01-22 MED ORDER — ASPIRIN 81 MG PO CHEW
81.0000 mg | CHEWABLE_TABLET | Freq: Every day | ORAL | Status: DC
  Administered 2013-01-23 – 2013-01-25 (×3): 81 mg via ORAL
  Filled 2013-01-22 (×3): qty 1

## 2013-01-22 MED ORDER — PANTOPRAZOLE SODIUM 40 MG PO TBEC
80.0000 mg | DELAYED_RELEASE_TABLET | Freq: Every day | ORAL | Status: DC
  Administered 2013-01-23: 80 mg via ORAL
  Filled 2013-01-22: qty 2

## 2013-01-22 MED ORDER — PIPERACILLIN-TAZOBACTAM 3.375 G IVPB
3.3750 g | Freq: Once | INTRAVENOUS | Status: AC
  Administered 2013-01-22: 3.375 g via INTRAVENOUS
  Filled 2013-01-22: qty 50

## 2013-01-22 MED ORDER — ONDANSETRON HCL 4 MG/2ML IJ SOLN
4.0000 mg | Freq: Four times a day (QID) | INTRAMUSCULAR | Status: DC | PRN
  Administered 2013-01-23: 4 mg via INTRAVENOUS
  Filled 2013-01-22 (×2): qty 2

## 2013-01-22 MED ORDER — VANCOMYCIN HCL IN DEXTROSE 1-5 GM/200ML-% IV SOLN
1000.0000 mg | Freq: Once | INTRAVENOUS | Status: AC
  Administered 2013-01-22: 1000 mg via INTRAVENOUS
  Filled 2013-01-22: qty 200

## 2013-01-22 MED ORDER — AMLODIPINE BESYLATE 10 MG PO TABS
10.0000 mg | ORAL_TABLET | Freq: Every day | ORAL | Status: DC
  Administered 2013-01-23 – 2013-01-25 (×3): 10 mg via ORAL
  Filled 2013-01-22 (×3): qty 1

## 2013-01-22 NOTE — ED Notes (Signed)
Pt arrived via EMS from [facility].  Pt has had nausea for a week with emesis for the last two days.  Pt and pt's family are concerned of possible pneumonia.  Pt has a hx of c-diff.  Family is concern pt has not recovered from said.  EMS were told by Nursing staff at facility that pt also had cloudy urine.  Pt has had a complaint of shortness of breath intermittent during the last week.

## 2013-01-22 NOTE — ED Notes (Signed)
ZOX:[id number]<AV> Expected date:01/22/13<BR> Expected time: 9:12 PM<BR> Means of arrival:Ambulance<BR> Comments:<BR> Nausea/fever; c diff

## 2013-01-22 NOTE — ED Provider Notes (Signed)
CSN: [id number]     Arrival date & time 01/22/13  2134 History     First MD Initiated Contact with Patient 01/22/13 2139     Chief Complaint  Patient presents with  . Nausea  . Emesis   (Consider location/radiation/quality/duration/timing/severity/associated sxs/prior Treatment) Patient is a 79 y.o. female presenting with vomiting. The history is provided by the patient and the EMS personnel.  Emesis Associated symptoms: no abdominal pain and no headaches   pt w hx cad, cva, arrives via ems from [facility] w c/o generalized weakness for past week, intermittent nv (?color, pt unsure), productive cough, and mild sob.  gen weakness constant. Cough episodic. Symptoms moderate. Pt denies specific exacerbating or alleviating factors. Pt was hospitalized last month w diarrhea/c diff. No current diarrhea. Denies abd pain or distension.  Pt denies cp. No orthopnea or pnd. Subjective fever/chills.      Past Medical History  Diagnosis Date  . SOB (shortness of breath)   . MI (myocardial infarction)   . Poor appetite   . Arthritis   . Fatigue   . Right ear pain   . Renal insufficiency   . RA (rheumatoid arthritis)   . PUD (peptic ulcer disease)   . GI bleed 2005  . GERD (gastroesophageal reflux disease)   . Hiatal hernia   . Hypothyroidism   . Osteoporosis   . Depression   . Anxiety   . Coronary artery disease   . CVA (cerebral vascular accident)   . TIA (transient ischemic attack)   . Fall   . DVT of lower extremity (deep venous thrombosis)    Past Surgical History  Procedure Laterality Date  . Nstemi  06/2010  . Spinal fusion surgery    . Knee surgery    . Cardiac catheterization      SHOWED RUPTURE PLAQUE IN THE LAD. THE LAD IS NONOBSTRUCTIVE WITH ONLY 30-40% STENOSIS   Family History  Problem Relation Age of Onset  . Kidney disease Mother   . Kidney disease Brother    History  Substance Use Topics  . Smoking status: Never Smoker   . Smokeless tobacco: Never Used  .  Alcohol Use: No   OB History   Grav Para Term Preterm Abortions TAB SAB Ect Mult Living                 Review of Systems  Constitutional: Positive for fever.  HENT: Negative for neck pain and neck stiffness.   Eyes: Negative for pain and visual disturbance.  Respiratory: Positive for cough and shortness of breath.   Cardiovascular: Negative for chest pain and leg swelling.  Gastrointestinal: Positive for nausea and vomiting. Negative for abdominal pain.  Genitourinary: Negative for dysuria and flank pain.  Musculoskeletal: Negative for back pain.  Skin: Negative for rash.  Neurological: Negative for headaches.  Hematological: Does not bruise/bleed easily.  Psychiatric/Behavioral: Negative for confusion.    Allergies  Codeine; Morphine and related; Percocet; Plavix; and Sulfur  Home Medications   Current Outpatient Rx  Name  Route  Sig  Dispense  Refill  . acetaminophen (TYLENOL) 650 MG suppository   Rectal   Place 650 mg rectally every 4 (four) hours as needed. Pain         . amLODipine (NORVASC) 10 MG tablet   Oral   Take 1 tablet (10 mg total) by mouth daily.   30 tablet   0   . aspirin 81 MG chewable tablet   Oral   Chew  81 mg by mouth daily.         . baclofen (LIORESAL) 10 MG tablet   Oral   Take 10 mg by mouth 3 (three) times daily - between meals and at bedtime.         Marland Kitchen esomeprazole (NEXIUM) 40 MG capsule   Oral   Take 40 mg by mouth daily before breakfast.          . ferrous sulfate 325 (65 FE) MG tablet   Oral   Take 325 mg by mouth daily with breakfast.         . Folic Acid-Vit B6-Vit B12 (FOLBEE) 2.5-25-1 MG TABS   Oral   Take 1 tablet by mouth daily.         Marland Kitchen HYDROcodone-acetaminophen (NORCO/VICODIN) 5-325 MG per tablet      Take one tablet by mouth every night at bedtime for pain. Take one tablet by mouth every 6 hours as needed for pain.   150 tablet   5   . HYDROcodone-acetaminophen (VICODIN) 5-500 MG per tablet   Oral    Take 1 tablet by mouth at bedtime. 1 tablet every 6 hrs as needed for pain   30 tablet   0   . levothyroxine (SYNTHROID, LEVOTHROID) 50 MCG tablet   Oral   Take 50 mcg by mouth daily.          Marland Kitchen LORazepam (ATIVAN) 0.5 MG tablet      Take 1 tablet every night at bedtime as needed for anxiety   30 tablet   0   . metoprolol (LOPRESSOR) 50 MG tablet   Oral   Take 50 mg by mouth 2 (two) times daily.          . Multiple Vitamin (MULTIVITAMIN) tablet   Oral   Take 1 tablet by mouth daily.           . NON FORMULARY   Oral   Take 1 tablet by mouth daily. FOLIC ACID 1 MG/VITAMIN B 9         . polyethylene glycol (MIRALAX / GLYCOLAX) packet   Oral   Take 17 g by mouth daily as needed.   14 each   0   . predniSONE (DELTASONE) 10 MG tablet   Oral   Take 10 mg by mouth daily.         . promethazine (PHENERGAN) 25 MG tablet   Oral   Take 25 mg by mouth every 6 (six) hours as needed. nausea         . rosuvastatin (CRESTOR) 20 MG tablet   Oral   Take 20 mg by mouth every evening.          Marland Kitchen saccharomyces boulardii (FLORASTOR) 250 MG capsule   Oral   Take 1 capsule (250 mg total) by mouth 2 (two) times daily.   60 capsule   0    BP 125/54  Pulse 116  Temp(Src) 99.8 F (37.7 C) (Oral)  Resp 16  SpO2 93% Physical Exam  Nursing note and vitals reviewed. Constitutional: She appears well-developed and well-nourished. No distress.  HENT:  Mouth/Throat: Oropharynx is clear and moist.  Eyes: Conjunctivae are normal. No scleral icterus.  Neck: Neck supple. No tracheal deviation present.  Cardiovascular: Normal heart sounds and intact distal pulses.   Pulmonary/Chest: Effort normal. No respiratory distress.  Rales bases  Abdominal: Soft. Normal appearance and bowel sounds are normal. She exhibits no distension. There is no tenderness.  Genitourinary:  No cva tenderness  Musculoskeletal: She exhibits no edema and no tenderness.  Neurological: She is alert.   Skin: Skin is warm and dry. No rash noted. She is not diaphoretic.  Psychiatric: She has a normal mood and affect.    ED Course   Procedures (including critical care time)  pts labs and cxr printed copies on chart.   MDM  Iv ns.    Reviewed results from earlier today from ecf, wbc 18, AKI w Cr 1.65 increased from recent baseline, cxr w bil infiltrates concerning for pna.    Zosyn iv. vanc iv.  Med service called to admit.   Reviewed nursing notes and prior charts for additional history.     Suzi Roots, MD 01/22/13 2225

## 2013-01-22 NOTE — H&P (Signed)
[facility] History and Physical  Frimy Uffelman ZOX:[id number] DOB: August 21, 1929 DOA: 01/22/2013  Referring physician: ED PCP: Oneal Grout, MD   Chief Complaint: Nausea, Emesis  HPI: [name] is a 79 y.o. female who presents to the ED from her SNF with c/o generalized weakness for the past week, intermittent n/v (patient unsure of color), productive cough, SOB.  Symptoms have been constant and moderate in severity.  Nothing seems to make this better or worse, she denies diarrhea recently (she just got over a C.Diff infection and was hospitalized on our service with this earlier this month and completed a course of PO vanc).  Does have subjective fever and chills.  She thinks she urinated this morning but does not remember for sure.  She presented initially to her doctor at her SNF who had labs drawn and an abdominal CT performed.  WBC was 18k, CT scan showed B lower lobe infiltrates L > R, and no acute intra-abdominal process.  She was sent to the ED for her symptoms where she was found to be running a temperature of 99.9, be tachycardic in the 110s, and have an O2 sat of 93%.  Hospitalist has been asked to admit for HCAP.  Review of Systems: 12 systems reviewed and otherwise negative.  Past Medical History  Diagnosis Date  . SOB (shortness of breath)   . MI (myocardial infarction)   . Poor appetite   . Arthritis   . Fatigue   . Right ear pain   . Renal insufficiency   . RA (rheumatoid arthritis)   . PUD (peptic ulcer disease)   . GI bleed 2005  . GERD (gastroesophageal reflux disease)   . Hiatal hernia   . Hypothyroidism   . Osteoporosis   . Depression   . Anxiety   . Coronary artery disease   . CVA (cerebral vascular accident)   . TIA (transient ischemic attack)   . Fall   . DVT of lower extremity (deep venous thrombosis)    Past Surgical History  Procedure Laterality Date  . Nstemi  06/2010  . Spinal fusion surgery    . Knee surgery    . Cardiac  catheterization      SHOWED RUPTURE PLAQUE IN THE LAD. THE LAD IS NONOBSTRUCTIVE WITH ONLY 30-40% STENOSIS   Social History:  reports that she has never smoked. She has never used smokeless tobacco. She reports that she does not drink alcohol or use illicit drugs.   Allergies  Allergen Reactions  . Codeine     sick  . Morphine And Related Other (See Comments)    unknown  . Percocet (Oxycodone-Acetaminophen)     unknown  . Plavix (Clopidogrel Bisulfate) Other (See Comments)    unknown  . Sulfur Other (See Comments)    unknown    Family History  Problem Relation Age of Onset  . Kidney disease Mother   . Kidney disease Brother    Prior to Admission medications   Medication Sig Start Date End Date Taking? Authorizing Provider  amLODipine (NORVASC) 10 MG tablet Take 1 tablet (10 mg total) by mouth daily. 07/08/12  Yes Penny Pia, MD  aspirin 81 MG chewable tablet Chew 81 mg by mouth daily.   Yes Historical Provider, MD  esomeprazole (NEXIUM) 40 MG capsule Take 40 mg by mouth daily before breakfast.    Yes Historical Provider, MD  ferrous sulfate 325 (65 FE) MG tablet Take 325 mg by mouth daily with breakfast.   Yes Historical Provider,  MD  Folic Acid-Vit B6-Vit B12 (FOLBEE) 2.5-25-1 MG TABS Take 1 tablet by mouth daily.   Yes Historical Provider, MD  HYDROcodone-acetaminophen (NORCO/VICODIN) 5-325 MG per tablet Take one tablet by mouth every night at bedtime for pain. Take one tablet by mouth every 6 hours as needed for pain. 01/18/13  Yes Claudie Revering, NP  levothyroxine (SYNTHROID, LEVOTHROID) 50 MCG tablet Take 50 mcg by mouth daily.    Yes Historical Provider, MD  LORazepam (ATIVAN) 0.5 MG tablet Take 1 tablet every night at bedtime as needed for anxiety 12/22/12  Yes Alison Murray, MD  metoprolol (LOPRESSOR) 50 MG tablet Take 50 mg by mouth 2 (two) times daily.    Yes Historical Provider, MD  Multiple Vitamin (MULTIVITAMIN) tablet Take 1 tablet by mouth daily.     Yes Historical  Provider, MD  predniSONE (DELTASONE) 10 MG tablet Take 10 mg by mouth daily.   Yes Historical Provider, MD  promethazine (PHENERGAN) 25 MG tablet Take 25 mg by mouth every 6 (six) hours as needed. nausea   Yes Historical Provider, MD  rosuvastatin (CRESTOR) 20 MG tablet Take 20 mg by mouth every evening.    Yes Historical Provider, MD  saccharomyces boulardii (FLORASTOR) 250 MG capsule Take 1 capsule (250 mg total) by mouth 2 (two) times daily. 06/04/12  Yes Laveda Norman, MD  sertraline (ZOLOFT) 50 MG tablet Take 50 mg by mouth every morning.   Yes Historical Provider, MD  vitamin C (ASCORBIC ACID) 500 MG tablet Take 500 mg by mouth daily.   Yes Historical Provider, MD  vancomycin (VANCOCIN) 50 mg/mL oral solution Take 125 mg by mouth 4 (four) times daily.    Historical Provider, MD   Physical Exam: Filed Vitals:   01/22/13 2136  BP: 125/54  Pulse: 116  Temp: 99.8 F (37.7 C)  TempSrc: Oral  Resp: 16  SpO2: 93%    General:  NAD, resting comfortably in bed Eyes: PEERLA EOMI ENT: mucous membranes moist Neck: supple w/o JVD Cardiovascular: RRR w/o MRG Respiratory: rales in B bases Abdomen: soft, nt, nd, bs+ Skin: no rash nor lesion Musculoskeletal: MAE, full ROM all 4 extremities Psychiatric: normal tone and affect Neurologic: AAOx3, grossly non-focal   Labs on Admission:  Basic Metabolic Panel: No results found for this basename: NA, K, CL, CO2, GLUCOSE, BUN, CREATININE, CALCIUM, MG, PHOS,  in the last 168 hours Liver Function Tests: No results found for this basename: AST, ALT, ALKPHOS, BILITOT, PROT, ALBUMIN,  in the last 168 hours No results found for this basename: LIPASE, AMYLASE,  in the last 168 hours No results found for this basename: AMMONIA,  in the last 168 hours CBC: No results found for this basename: WBC, NEUTROABS, HGB, HCT, MCV, PLT,  in the last 168 hours Cardiac Enzymes: No results found for this basename: CKTOTAL, CKMB, CKMBINDEX, TROPONINI,  in the last  168 hours  BNP (last 3 results)  Recent Labs  03/17/12 0530 03/18/12 0400 12/21/12 0359  PROBNP 1429.0* 3700.0* 1164.0*   CBG: No results found for this basename: GLUCAP,  in the last 168 hours  Radiological Exams on Admission: No results found.  EKG: Independently reviewed.  Assessment/Plan Principal Problem:   HCAP (healthcare-associated pneumonia) Active Problems:   Hypertension   Recurrent colitis due to Clostridium difficile   Sepsis   AKI (acute kidney injury)   1. HCAP with sepsis - putting patient on HCAP coverage and PNA pathway, blood and sputum cultures pending, repeat labs in  AM, IVF at 125 cc/hr.  No hypotension at this point so wont hold BP meds yet, tele monitor for her tachycardia, tylenol if she develops fever, treat her N/V with zofran and phenergan.  Pulse ox and O2 per protocol if needed. 2. AKI - likely due to #1 creatinine 1.6 baseline is between 1.0-1.3, strict intake and output, IVF ordered, repeat BMP in AM. 3. H/o recurrent colitis due to C.Diff - just got over a hospital stay for C.Diff earlier this month, at very high risk to re-develop this as we are forced to expose her to broad spectrum ABx therapy to treat the HCAP.  Monitor for signs of development of this and treat if it reoccurs.    Code Status: Full Code (must indicate code status--if unknown or must be presumed, indicate so) Family Communication: No family in room (indicate person spoken with, if applicable, with phone number if by telephone) Disposition Plan: Admit to inpatient (indicate anticipated LOS)  Time spent: 70 min  Lyndi Holbein M. [facility] Pager [phone]  If 7PM-7AM, please contact night-coverage [email or web address] Password [facility] 01/22/2013, 10:58 PM

## 2013-01-23 ENCOUNTER — Inpatient Hospital Stay (HOSPITAL_COMMUNITY): Payer: Medicare Other

## 2013-01-23 DIAGNOSIS — N189 Chronic kidney disease, unspecified: Secondary | ICD-10-CM | POA: Diagnosis present

## 2013-01-23 DIAGNOSIS — R197 Diarrhea, unspecified: Secondary | ICD-10-CM | POA: Diagnosis not present

## 2013-01-23 DIAGNOSIS — J9819 Other pulmonary collapse: Secondary | ICD-10-CM | POA: Diagnosis not present

## 2013-01-23 DIAGNOSIS — E860 Dehydration: Secondary | ICD-10-CM | POA: Diagnosis present

## 2013-01-23 DIAGNOSIS — N179 Acute kidney failure, unspecified: Secondary | ICD-10-CM | POA: Diagnosis not present

## 2013-01-23 DIAGNOSIS — E876 Hypokalemia: Secondary | ICD-10-CM | POA: Diagnosis not present

## 2013-01-23 DIAGNOSIS — R112 Nausea with vomiting, unspecified: Secondary | ICD-10-CM | POA: Diagnosis present

## 2013-01-23 LAB — CLOSTRIDIUM DIFFICILE BY PCR: Toxigenic C. Difficile by PCR: NEGATIVE

## 2013-01-23 LAB — CBC
Platelets: 182 10*3/uL (ref 150–400)
RDW: 15.4 % (ref 11.5–15.5)
WBC: 17.5 10*3/uL — ABNORMAL HIGH (ref 4.0–10.5)

## 2013-01-23 LAB — COMPREHENSIVE METABOLIC PANEL
AST: 13 U/L (ref 0–37)
Albumin: 2.5 g/dL — ABNORMAL LOW (ref 3.5–5.2)
Alkaline Phosphatase: 55 U/L (ref 39–117)
Chloride: 99 mEq/L (ref 96–112)
Potassium: 3.4 mEq/L — ABNORMAL LOW (ref 3.5–5.1)
Sodium: 133 mEq/L — ABNORMAL LOW (ref 135–145)
Total Bilirubin: 0.8 mg/dL (ref 0.3–1.2)
Total Protein: 5.6 g/dL — ABNORMAL LOW (ref 6.0–8.3)

## 2013-01-23 LAB — HIV ANTIBODY (ROUTINE TESTING W REFLEX): HIV: NONREACTIVE

## 2013-01-23 MED ORDER — LORAZEPAM 0.5 MG PO TABS
0.5000 mg | ORAL_TABLET | Freq: Every evening | ORAL | Status: DC | PRN
  Administered 2013-01-23 – 2013-01-24 (×2): 0.5 mg via ORAL
  Filled 2013-01-23 (×2): qty 1

## 2013-01-23 MED ORDER — CEFEPIME HCL 1 G IJ SOLR
1.0000 g | INTRAMUSCULAR | Status: DC
  Filled 2013-01-23: qty 1

## 2013-01-23 MED ORDER — FAMOTIDINE 20 MG PO TABS
20.0000 mg | ORAL_TABLET | Freq: Two times a day (BID) | ORAL | Status: DC
  Administered 2013-01-23: 20 mg via ORAL

## 2013-01-23 MED ORDER — ROSUVASTATIN CALCIUM 20 MG PO TABS
20.0000 mg | ORAL_TABLET | Freq: Every day | ORAL | Status: DC
  Administered 2013-01-23 – 2013-01-24 (×2): 20 mg via ORAL
  Filled 2013-01-23 (×3): qty 1

## 2013-01-23 MED ORDER — FAMOTIDINE 10 MG PO TABS
10.0000 mg | ORAL_TABLET | Freq: Two times a day (BID) | ORAL | Status: DC
  Administered 2013-01-23 – 2013-01-25 (×4): 10 mg via ORAL
  Filled 2013-01-23 (×7): qty 1

## 2013-01-23 MED ORDER — ACETAMINOPHEN 325 MG PO TABS: 650.0000 mg | ORAL_TABLET | Freq: Four times a day (QID) | ORAL | Status: DC | PRN

## 2013-01-23 MED ORDER — VANCOMYCIN HCL IN DEXTROSE 750-5 MG/150ML-% IV SOLN
750.0000 mg | Freq: Every day | INTRAVENOUS | Status: DC
  Filled 2013-01-23: qty 150

## 2013-01-23 MED ORDER — POTASSIUM CHLORIDE CRYS ER 20 MEQ PO TBCR
20.0000 meq | EXTENDED_RELEASE_TABLET | Freq: Once | ORAL | Status: AC
  Administered 2013-01-23: 20 meq via ORAL
  Filled 2013-01-23: qty 1

## 2013-01-23 NOTE — Progress Notes (Signed)
Pt weaned off oxygen. Pt oxygen level was 96% on room air. Will continue to monitor.

## 2013-01-23 NOTE — Progress Notes (Signed)
Pt had 1 loose BM this morning. Pt stated that her last BM was 1 week ago at [facility]. Will continue to monitor.

## 2013-01-23 NOTE — Progress Notes (Signed)
Pharmacy - Brief note   Adjusted Cefepime to 1g IV q24h due to CrCl ~66ml/min.  F/u daily.  Charolotte Eke, PharmD, pager [phone]. 01/23/2013,10:39 AM.

## 2013-01-23 NOTE — Progress Notes (Signed)
ANTIBIOTIC CONSULT NOTE - INITIAL  Pharmacy Consult for Vancomycin Indication: PNA  Allergies  Allergen Reactions  . Codeine     sick  . Morphine And Related Other (See Comments)    unknown  . Percocet (Oxycodone-Acetaminophen)     unknown  . Plavix (Clopidogrel Bisulfate) Other (See Comments)    unknown  . Sulfur Other (See Comments)    unknown    Patient Measurements: Height: 5\' 5"  (165.1 cm) Weight: 159 lb 13.3 oz (72.5 kg) IBW/kg (Calculated) : 57 Adjusted Body Weight:   Vital Signs: Temp: 98.5 F (36.9 C) (07/27 0444) Temp src: Oral (07/27 0444) BP: 126/59 mmHg (07/27 0444) Pulse Rate: 100 (07/27 0444) Intake/Output from previous day: 07/26 0701 - 07/27 0700 In: 1170 [P.O.:120; I.V.:1000; IV Piggyback:50] Out: 1 [Urine:1] Intake/Output from this shift: Total I/O In: 1170 [P.O.:120; I.V.:1000; IV Piggyback:50] Out: 1 [Urine:1]  Labs:  Recent Labs  01/23/13 0447  WBC 17.5*  HGB 10.1*  PLT 182  CREATININE 1.72*   Estimated Creatinine Clearance: 26 ml/min (by C-G formula based on Cr of 1.72). No results found for this basename: VANCOTROUGH, Leodis Binet, VANCORANDOM, GENTTROUGH, GENTPEAK, GENTRANDOM, TOBRATROUGH, TOBRAPEAK, TOBRARND, AMIKACINPEAK, AMIKACINTROU, AMIKACIN,  in the last 72 hours   Microbiology: Recent Results (from the past 720 hour(s))  MRSA PCR SCREENING     Status: None   Collection Time    01/23/13 12:46 AM      Result Value Range Status   MRSA by PCR NEGATIVE  NEGATIVE Final   Comment:            The GeneXpert MRSA Assay (FDA     approved for NASAL specimens     only), is one component of a     comprehensive MRSA colonization     surveillance program. It is not     intended to diagnose MRSA     infection nor to guide or     monitor treatment for     MRSA infections.    Medical History: Past Medical History  Diagnosis Date  . SOB (shortness of breath)   . MI (myocardial infarction)   . Poor appetite   . Arthritis   .  Fatigue   . Right ear pain   . Renal insufficiency   . RA (rheumatoid arthritis)   . PUD (peptic ulcer disease)   . GI bleed 2005  . GERD (gastroesophageal reflux disease)   . Hiatal hernia   . Hypothyroidism   . Osteoporosis   . Depression   . Anxiety   . Coronary artery disease   . CVA (cerebral vascular accident)   . TIA (transient ischemic attack)   . Fall   . DVT of lower extremity (deep venous thrombosis)     Medications:  Anti-infectives   Start     Dose/Rate Route Frequency Ordered Stop   01/23/13 2200  vancomycin (VANCOCIN) IVPB 750 mg/150 ml premix     750 mg 150 mL/hr over 60 Minutes Intravenous Daily at bedtime 01/23/13 0628     01/22/13 2300  ceFEPIme (MAXIPIME) 1 g in dextrose 5 % 50 mL IVPB     1 g 100 mL/hr over 30 Minutes Intravenous 3 times per day 01/22/13 2256 01/30/13 2159   01/22/13 2200  piperacillin-tazobactam (ZOSYN) IVPB 3.375 g     3.375 g 100 mL/hr over 30 Minutes Intravenous  Once 01/22/13 2148 01/22/13 2315   01/22/13 2200  vancomycin (VANCOCIN) IVPB 1000 mg/200 mL premix     1,000  mg 200 mL/hr over 60 Minutes Intravenous  Once 01/22/13 2148 01/23/13 0015     Assessment: Patient with PNA.  First dose of antibiotics already given in ED.  Patient with poor renal function (<53mL/min)  Goal of Therapy:  Vancomycin trough level 15-20 mcg/ml  Plan:  Measure antibiotic drug levels at steady state Follow up culture results Vancomycin 750mg  iv q24hr  Darlina Guys, Lakyn Alsteen Crowford 01/23/2013,6:30 AM

## 2013-01-23 NOTE — Progress Notes (Addendum)
[facility] PROGRESS NOTE  [name] ZOX:[id number] DOB: 08-08-1929 DOA: 01/22/2013 PCP: Oneal Grout, MD  Brief narrative 79 year old female patient with history of recurrent C. difficile (December 2013 & June 2014) and completed 14 day course of oral vancomycin on 01/06/13, other extensive PMH-chronic kidney disease, rheumatoid arthritis, chronic prednisone treatment, GERD, hypothyroidism, anxiety and depression, CAD, CVA was transferred from nursing facility on 01/22/13 to the [facility] with complaints of generalized weakness, nausea and emesis. Patient states that she has mild intermittent nonproductive cough and denies dyspnea. According to patient, her last BM was a week ago and then she had a loose BM in the hospital on 7/27. Subjective fever and chills. Workup at [facility] 18 K, up from 14 K, creatinine 1.6, negative abdominal x-ray and chest x-ray showed bibasal infiltrates. No record of CT abdomen being done as OP. In the ED, temperature 99.9, pulse 110s and O2 sat 93%. Hospitalist admission was requested for presumed healthcare acquired pneumonia.   Assessment/Plan: 1. Diarrhea, nausea & vomiting, R/O recurrent C Diff: Patient is at high risk for recurrent C. difficile. Discussed with infectious disease M.D. on call and agree that she does not have picture convincing for pneumonia & thereby, will DC all antibiotics. Check stool for C. difficile PCR and if positive, slow vancomycin taper over 6 weeks. Continue probiotics. DC PPIs. Would be very conservative and careful in starting antibiotics for other indications unless deemed absolutely necessary. 2. Dehydration: Secondary to nausea, vomiting and diarrhea. Gentle IV fluids. 3. Acute on stage III chronic kidney disease: Creatinine up from 1.13 on 6/25-1.72. Likely secondary to dehydration. IV fluids and follow up BMP in a.m. 4. Hypokalemia: Replete and follow. 5. Anemia: Likely secondary to chronic kidney disease.  Stable. 6. Hypertension: Controlled. Continue home amlodipine and metoprolol. 7. Hypothyroidism: Continue Synthroid. 8. Leukocytosis: Secondary to problem #1. Management as above. 9. Rheumatoid arthritis/chronic prednisone/Adrenal insufficiency: Continue home dose of prednisone. 10. GERD: DC PPIs and place on by mouth Pepcid. 11. Hypothyroidism:   Code Status: Full Family Communication: Discussed with daughter Ms. Alesia May Disposition Plan: Return to SNF when medically stable.   Consultants:  None  Procedures:  None  Antibiotics:  IV cefepime 7/26 > 7/27  IV vancomycin 7/26 > 7/27   HPI/Subjective: Complains of arthritic chronic pains. Minimal intermittent nonproductive cough. Denies dyspnea or chest pain. One episode of loose stools this morning. No nausea or vomiting. No abdominal pain.  Objective: Filed Vitals:   01/22/13 2136 01/23/13 0015 01/23/13 0135 01/23/13 0444  BP: 125/54 125/56  126/59  Pulse: 116 110  100  Temp: 99.8 F (37.7 C) 98.4 F (36.9 C)  98.5 F (36.9 C)  TempSrc: Oral Oral  Oral  Resp: 16 16  18   Height:  5\' 5"  (1.651 m)    Weight:  72.5 kg (159 lb 13.3 oz)    SpO2: 93% 94% 98% 98%    Intake/Output Summary (Last 24 hours) at 01/23/13 1236 Last data filed at 01/23/13 0500  Gross per 24 hour  Intake   1170 ml  Output      1 ml  Net   1169 ml   Filed Weights   01/23/13 0015  Weight: 72.5 kg (159 lb 13.3 oz)    Exam:   General exam: Comfortable. Elderly chronically ill-looking female patient.  Respiratory system: Slightly reduced breath sounds in the bases but otherwise clear to auscultation. No increased work of breathing.  Cardiovascular system: S1 & S2 heard, RRR. No JVD, murmurs,  gallops, clicks or pedal edema. Telemetry: Sinus tachycardia in the 100s.  Gastrointestinal system: Abdomen is nondistended, soft and nontender. Normal bowel sounds heard.  Central nervous system: Alert and oriented. No focal neurological  deficits.  Extremities: Symmetric 5 x 5 power. Limited movement of upper extremity secondary to pain. No acute findings.   Data Reviewed: Basic Metabolic Panel:  Recent Labs Lab 01/23/13 0447  NA 133*  K 3.4*  CL 99  CO2 24  GLUCOSE 125*  BUN 27*  CREATININE 1.72*  CALCIUM 8.3*   Liver Function Tests:  Recent Labs Lab 01/23/13 0447  AST 13  ALT 9  ALKPHOS 55  BILITOT 0.8  PROT 5.6*  ALBUMIN 2.5*   No results found for this basename: LIPASE, AMYLASE,  in the last 168 hours No results found for this basename: AMMONIA,  in the last 168 hours CBC:  Recent Labs Lab 01/23/13 0447  WBC 17.5*  HGB 10.1*  HCT 31.3*  MCV 89.2  PLT 182   Cardiac Enzymes: No results found for this basename: CKTOTAL, CKMB, CKMBINDEX, TROPONINI,  in the last 168 hours BNP (last 3 results)  Recent Labs  03/17/12 0530 03/18/12 0400 12/21/12 0359  PROBNP 1429.0* 3700.0* 1164.0*   CBG: No results found for this basename: GLUCAP,  in the last 168 hours  Recent Results (from the past 240 hour(s))  MRSA PCR SCREENING     Status: None   Collection Time    01/23/13 12:46 AM      Result Value Range Status   MRSA by PCR NEGATIVE  NEGATIVE Final   Comment:            The GeneXpert MRSA Assay (FDA     approved for NASAL specimens     only), is one component of a     comprehensive MRSA colonization     surveillance program. It is not     intended to diagnose MRSA     infection nor to guide or     monitor treatment for     MRSA infections.     Studies: Dg Chest 2 View  01/23/2013   *RADIOLOGY REPORT*  Clinical Data: Fever and cough.  CHEST - 2 VIEW  Comparison: Chest x-ray 12/18/2012.  Findings: Lung volumes are low.  Linear opacity in the right mid lung is most compatible with subsegmental atelectasis.  Linear opacity the periphery of the left base is unchanged and most compatible with chronic scarring.  No acute consolidative airspace disease.  No pleural effusions.  No evidence of  pulmonary edema. Heart size is upper limits of normal.  Large hiatal hernia again noted.  Upper mediastinal contours are within normal limits. Atherosclerosis in the thoracic aorta.  Orthopedic fixation hardware throughout the visualized thoracolumbar spine.  IMPRESSION: 1.  Low lung volumes without radiographic evidence of acute cardiopulmonary disease. 2.  Increased subsegmental atelectasis in the right mid lung. 3.  Large hiatal hernia. 4.  Atherosclerosis.   Original Report Authenticated By: Trudie Reed, M.D.     Additional labs:   Scheduled Meds: . amLODipine  10 mg Oral Daily  . aspirin  81 mg Oral Daily  . ferrous sulfate  325 mg Oral Q breakfast  . Folic Acid-Vit B6-Vit B12  1 tablet Oral Daily  . heparin  5,000 Units Subcutaneous Q8H  . levothyroxine  50 mcg Oral QAC breakfast  . metoprolol  50 mg Oral BID  . multivitamin with minerals  1 tablet Oral Daily  . pantoprazole  80 mg Oral Q1200  . predniSONE  10 mg Oral Daily  . rosuvastatin  20 mg Oral q1800  . saccharomyces boulardii  250 mg Oral BID  . sertraline  50 mg Oral q morning - 10a  . vitamin C  500 mg Oral Daily   Continuous Infusions: . sodium chloride      Principal Problem:   HCAP (healthcare-associated pneumonia) Active Problems:   Hypertension   Recurrent colitis due to Clostridium difficile   Sepsis   AKI (acute kidney injury)    Time spent: 45 minutes.    [facility]  [facility] Pager [phone].   If 8PM-8AM, please contact night-coverage at [email or web address], password [facility] 01/23/2013, 12:36 PM  LOS: 1 day

## 2013-01-23 NOTE — Progress Notes (Signed)
Was incontinent in her diaper upon admission to the floor at 0015, has not had urge to urinate since.  We are awaiting void to collect specimens.  Large possibility pt will be incont ... Doctor do you want [place] to do an in and out cath to collect urine specimens??

## 2013-01-24 DIAGNOSIS — N179 Acute kidney failure, unspecified: Secondary | ICD-10-CM | POA: Diagnosis not present

## 2013-01-24 DIAGNOSIS — E860 Dehydration: Secondary | ICD-10-CM | POA: Diagnosis not present

## 2013-01-24 DIAGNOSIS — E876 Hypokalemia: Secondary | ICD-10-CM | POA: Diagnosis not present

## 2013-01-24 DIAGNOSIS — R197 Diarrhea, unspecified: Secondary | ICD-10-CM | POA: Diagnosis not present

## 2013-01-24 LAB — BASIC METABOLIC PANEL
Calcium: 7.7 mg/dL — ABNORMAL LOW (ref 8.4–10.5)
Creatinine, Ser: 1.37 mg/dL — ABNORMAL HIGH (ref 0.50–1.10)
GFR calc non Af Amer: 35 mL/min — ABNORMAL LOW (ref >90)
Glucose, Bld: 99 mg/dL (ref 70–99)
Sodium: 138 mEq/L (ref 135–145)

## 2013-01-24 LAB — CBC
HCT: 26.5 % — ABNORMAL LOW (ref 36.0–46.0)
Hemoglobin: 8.7 g/dL — ABNORMAL LOW (ref 12.0–15.0)
MCH: 29.8 pg (ref 26.0–34.0)
MCHC: 32.8 g/dL (ref 30.0–36.0)
MCV: 90.8 fL (ref 78.0–100.0)
Platelets: 149 10*3/uL — ABNORMAL LOW (ref 150–400)
RBC: 2.92 MIL/uL — ABNORMAL LOW (ref 3.87–5.11)
RDW: 15.2 % (ref 11.5–15.5)
WBC: 14.8 10*3/uL — ABNORMAL HIGH (ref 4.0–10.5)

## 2013-01-24 LAB — URINE CULTURE

## 2013-01-24 MED ORDER — GUAIFENESIN-DM 100-10 MG/5ML PO SYRP
5.0000 mL | ORAL_SOLUTION | ORAL | Status: DC | PRN
  Administered 2013-01-24 – 2013-01-25 (×3): 5 mL via ORAL
  Filled 2013-01-24 (×3): qty 10

## 2013-01-24 NOTE — Evaluation (Signed)
Occupational Therapy Evaluation Patient Details Name: [name] MRN: [id number] DOB: [birth year] Today's Date: 01/24/2013 Time: 4098-1191 OT Time Calculation (min): 22 min  OT Assessment / Plan / Recommendation History of present illness 79 year old female patient with history of recurrent C. difficile (December 2013 & June 2014) and completed 14 day course of oral vancomycin on 01/06/13, other extensive PMH-chronic kidney disease, rheumatoid arthritis, chronic prednisone treatment, GERD, hypothyroidism, anxiety and depression, CAD, CVA was transferred from nursing facility on 01/22/13 to the [name] with complaints of generalized weakness, nausea and emesis. Patient states that she has mild intermittent nonproductive cough and denies dyspnea. According to patient, her last BM was a week ago and then she had a loose BM in the hospital on 7/27. Subjective fever and chills. Workup at [facility] 18 K, up from 14 K, creatinine 1.6, negative abdominal x-ray and chest x-ray showed bibasal infiltrates. No record of CT abdomen being done as OP. In the ED, temperature 99.9, pulse 110s and O2 sat 93%. Hospitalist admission was requested for presumed healthcare acquired pneumonia   Clinical Impression   Pt presents to OT with decreased I with ADL activity , but not far from baseline per pt.  Pt lives at [name] and plans on returning there. Recommend pt work with OT at SNF to regain her I and return to [name]    OT Assessment  All further OT needs can be met in the next venue of care    Follow Up Recommendations  SNF       Equipment Recommendations  None recommended by OT       Frequency  Min 2X/week    Precautions / Restrictions Precautions Precautions: Fall       ADL  Grooming: Performed;Wash/dry face Where Assessed - Grooming: Unsupported sitting Upper Body Dressing: Simulated;Minimal assistance Where Assessed - Upper Body Dressing: Unsupported sitting Lower Body Dressing:  Simulated;Maximal assistance Where Assessed - Lower Body Dressing: Unsupported sit to stand Toilet Transfer: Simulated;Minimal assistance Toilet Transfer Method: Sit to stand;Stand pivot Toilet Transfer Equipment: Bedside commode Toileting - Clothing Manipulation and Hygiene: Performed;Minimal assistance Where Assessed - Engineer, mining and Hygiene: Sit to stand from 3-in-1 or toilet    OT Diagnosis: Generalized weakness  OT Problem List: Decreased strength;Decreased activity tolerance OT Treatment Interventions: Self-care/ADL training;Patient/family education;DME and/or AE instruction   OT Goals(Current goals can be found in the care plan section) Acute Rehab OT Goals Patient Stated Goal: go back to [facility] OT Goal Formulation: With patient  Visit Information  Last OT Received On: 01/24/13 Assistance Needed: +1 History of Present Illness: 79 year old female patient with history of recurrent C. difficile (December 2013 & June 2014) and completed 14 day course of oral vancomycin on 01/06/13, other extensive PMH-chronic kidney disease, rheumatoid arthritis, chronic prednisone treatment, GERD, hypothyroidism, anxiety and depression, CAD, CVA was transferred from nursing facility on 01/22/13 to the [name] with complaints of generalized weakness, nausea and emesis. Patient states that she has mild intermittent nonproductive cough and denies dyspnea. According to patient, her last BM was a week ago and then she had a loose BM in the hospital on 7/27. Subjective fever and chills. Workup at [facility] 18 K, up from 14 K, creatinine 1.6, negative abdominal x-ray and chest x-ray showed bibasal infiltrates. No record of CT abdomen being done as OP. In the ED, temperature 99.9, pulse 110s and O2 sat 93%. Hospitalist admission was requested for presumed healthcare acquired pneumonia       Prior  Functioning     Home Living Family/patient expects to be discharged to::  Skilled nursing facility Additional Comments: Pt walked with her walker, but spent most of day in wheelchair Communication Communication: No difficulties         Vision/Perception Vision - History Baseline Vision: Wears glasses all the time Patient Visual Report: No change from baseline   Cognition       Extremity/Trunk Assessment Upper Extremity Assessment Upper Extremity Assessment: Overall WFL for tasks assessed     Mobility Bed Mobility Bed Mobility: Not assessed Transfers Transfers: Sit to Stand;Stand to Sit Sit to Stand: 3: Mod assist;From chair/3-in-1;With upper extremity assist Stand to Sit: 3: Mod assist;With upper extremity assist;To chair/3-in-1           End of Session OT - End of Session Activity Tolerance: Patient tolerated treatment well Patient left: in chair with call bell and phone with in reach  GO      Bethzy Hauck, [place] 01/24/2013, 2:35 PM

## 2013-01-24 NOTE — Progress Notes (Signed)
[facility] PROGRESS NOTE  Meg Niemeier BJY:[id number] DOB: [birth year] DOA: 01/22/2013 PCP: Oneal Grout, MD  Brief narrative 79 year old female patient with history of recurrent C. difficile (December 2013 & June 2014) and completed 14 day course of oral vancomycin on 01/06/13, other extensive PMH-chronic kidney disease, rheumatoid arthritis, chronic prednisone treatment, GERD, hypothyroidism, anxiety and depression, CAD, CVA was transferred from nursing facility on 01/22/13 to the [facility] with complaints of generalized weakness, nausea and emesis. Patient states that she has mild intermittent nonproductive cough and denies dyspnea. According to patient, her last BM was a week ago and then she had a loose BM in the hospital on 7/27. Subjective fever and chills. Workup at [facility] 18 K, up from 14 K, creatinine 1.6, negative abdominal x-ray and chest x-ray showed bibasal infiltrates. No record of CT abdomen being done as OP. In the ED, temperature 99.9, pulse 110s and O2 sat 93%. Hospitalist admission was requested for presumed healthcare acquired pneumonia.   Assessment/Plan: 1. Diarrhea, nausea & vomiting: Patient is at high risk for recurrent C. difficile. C. difficile PCR negative. Continue probiotics. DC PPIs. Antibiotics which were started for presumed pneumonia were discontinued as there was no convincing evidence of pneumonia. Would be very conservative and careful in starting any antibiotics unless deemed absolutely necessary. Some nausea but no vomiting. Had 3 loose BMs over last 24 hours.? Acute viral GE. If patient continues to have chronic nausea, may consider GI consultation as OP. 2. Dehydration: Secondary to nausea, vomiting and diarrhea. Improving. Continue gentle IV fluids for additional 24 hours. 3. Acute on stage III chronic kidney disease: Creatinine up from 1.13 on 6/25-1.72. Likely secondary to dehydration. IV fluids and follow up BMP -improved to 1.3. Continue  gentle IV fluids for additional 24 hours and follow BMP in a.m. 4. Hypokalemia: Replete and follow. Corrected. 5. Anemia: Likely secondary to chronic kidney disease. Hemoglobin dropped from 10.1 > 8.7? Dilutional. No bleeding. Follow up CBC in a.m. Transfuse only if hemoglobin less than 7 g per DL. 6. Hypertension: Controlled. Continue home amlodipine and metoprolol. 7. Hypothyroidism: Continue Synthroid. 8. Leukocytosis: Secondary to problem #1. Management as above. Decreasing. 9. Rheumatoid arthritis/chronic prednisone/Adrenal insufficiency: Continue home dose of prednisone. 10. GERD: DC PPIs and place on by mouth Pepcid. 11. Hypothyroidism:   Code Status: Full Family Communication: Discussed with daughter Ms. Alesia May on 7/27 Disposition Plan: Return to SNF when medically stable-possibly 7/29.   Consultants:  None  Procedures:  None  Antibiotics:  IV cefepime 7/26 > 7/27  IV vancomycin 7/26 > 7/27   HPI/Subjective: Feels much better. Chronic arthritic pains. Nausea but no vomiting. 3 loose BMs over the last 24 hours.  Objective: Filed Vitals:   01/23/13 1400 01/23/13 2023 01/24/13 0622 01/24/13 1428  BP: 116/59 120/55 133/52 108/54  Pulse: 88 72 77 89  Temp: 99.9 F (37.7 C) 99.3 F (37.4 C) 98.3 F (36.8 C) 98.2 F (36.8 C)  TempSrc: Oral Oral Oral Oral  Resp: 18 18 18 20   Height:      Weight:      SpO2: 93% 96% 96% 94%    Intake/Output Summary (Last 24 hours) at 01/24/13 1716 Last data filed at 01/24/13 1024  Gross per 24 hour  Intake 1218.33 ml  Output      0 ml  Net 1218.33 ml   Filed Weights   01/23/13 0015  Weight: 72.5 kg (159 lb 13.3 oz)    Exam:   General exam: Comfortable. Elderly chronically  ill-looking female patient.  Respiratory system: Slightly reduced breath sounds in the bases but otherwise clear to auscultation. No increased work of breathing.  Cardiovascular system: S1 & S2 heard, RRR. No JVD, murmurs, gallops, clicks or  pedal edema. Telemetry: Sinus rhythm.  Gastrointestinal system: Abdomen is nondistended, soft and nontender. Normal bowel sounds heard.  Central nervous system: Alert and oriented. No focal neurological deficits.  Extremities: Symmetric 5 x 5 power. Limited movement of upper extremity secondary to pain. No acute findings.   Data Reviewed: Basic Metabolic Panel:  Recent Labs Lab 01/23/13 0447 01/24/13 0424  NA 133* 138  K 3.4* 4.0  CL 99 110  CO2 24 22  GLUCOSE 125* 99  BUN 27* 24*  CREATININE 1.72* 1.37*  CALCIUM 8.3* 7.7*   Liver Function Tests:  Recent Labs Lab 01/23/13 0447  AST 13  ALT 9  ALKPHOS 55  BILITOT 0.8  PROT 5.6*  ALBUMIN 2.5*   No results found for this basename: LIPASE, AMYLASE,  in the last 168 hours No results found for this basename: AMMONIA,  in the last 168 hours CBC:  Recent Labs Lab 01/23/13 0447 01/24/13 0424  WBC 17.5* 14.8*  HGB 10.1* 8.7*  HCT 31.3* 26.5*  MCV 89.2 90.8  PLT 182 149*   Cardiac Enzymes: No results found for this basename: CKTOTAL, CKMB, CKMBINDEX, TROPONINI,  in the last 168 hours BNP (last 3 results)  Recent Labs  03/17/12 0530 03/18/12 0400 12/21/12 0359  PROBNP 1429.0* 3700.0* 1164.0*   CBG: No results found for this basename: GLUCAP,  in the last 168 hours  Recent Results (from the past 240 hour(s))  CULTURE, BLOOD (ROUTINE X 2)     Status: None   Collection Time    01/22/13 11:20 PM      Result Value Range Status   Specimen Description BLOOD RIGHT ANTECUBITAL   Final   Special Requests BOTTLES DRAWN AEROBIC AND ANAEROBIC 5CC   Final   Culture  Setup Time 01/23/2013 15:16   Final   Culture     Final   Value:        BLOOD CULTURE RECEIVED NO GROWTH TO DATE CULTURE WILL BE HELD FOR 5 DAYS BEFORE ISSUING A FINAL NEGATIVE REPORT   Report Status PENDING   Incomplete  CULTURE, BLOOD (ROUTINE X 2)     Status: None   Collection Time    01/22/13 11:25 PM      Result Value Range Status   Specimen  Description BLOOD RIGHT HAND   Final   Special Requests BOTTLES DRAWN AEROBIC AND ANAEROBIC 5CC   Final   Culture  Setup Time 01/23/2013 15:16   Final   Culture     Final   Value:        BLOOD CULTURE RECEIVED NO GROWTH TO DATE CULTURE WILL BE HELD FOR 5 DAYS BEFORE ISSUING A FINAL NEGATIVE REPORT   Report Status PENDING   Incomplete  MRSA PCR SCREENING     Status: None   Collection Time    01/23/13 12:46 AM      Result Value Range Status   MRSA by PCR NEGATIVE  NEGATIVE Final   Comment:            The GeneXpert MRSA Assay (FDA     approved for NASAL specimens     only), is one component of a     comprehensive MRSA colonization     surveillance program. It is not  intended to diagnose MRSA     infection nor to guide or     monitor treatment for     MRSA infections.  CLOSTRIDIUM DIFFICILE BY PCR     Status: None   Collection Time    01/23/13  8:24 AM      Result Value Range Status   C difficile by pcr NEGATIVE  NEGATIVE Final     Studies: Dg Chest 2 View  01/23/2013   *RADIOLOGY REPORT*  Clinical Data: Fever and cough.  CHEST - 2 VIEW  Comparison: Chest x-ray 12/18/2012.  Findings: Lung volumes are low.  Linear opacity in the right mid lung is most compatible with subsegmental atelectasis.  Linear opacity the periphery of the left base is unchanged and most compatible with chronic scarring.  No acute consolidative airspace disease.  No pleural effusions.  No evidence of pulmonary edema. Heart size is upper limits of normal.  Large hiatal hernia again noted.  Upper mediastinal contours are within normal limits. Atherosclerosis in the thoracic aorta.  Orthopedic fixation hardware throughout the visualized thoracolumbar spine.  IMPRESSION: 1.  Low lung volumes without radiographic evidence of acute cardiopulmonary disease. 2.  Increased subsegmental atelectasis in the right mid lung. 3.  Large hiatal hernia. 4.  Atherosclerosis.   Original Report Authenticated By: Trudie Reed, M.D.      Additional labs:   Scheduled Meds: . amLODipine  10 mg Oral Daily  . aspirin  81 mg Oral Daily  . famotidine  10 mg Oral BID  . ferrous sulfate  325 mg Oral Q breakfast  . Folic Acid-Vit B6-Vit B12  1 tablet Oral Daily  . heparin  5,000 Units Subcutaneous Q8H  . levothyroxine  50 mcg Oral QAC breakfast  . metoprolol  50 mg Oral BID  . multivitamin with minerals  1 tablet Oral Daily  . predniSONE  10 mg Oral Daily  . rosuvastatin  20 mg Oral q1800  . saccharomyces boulardii  250 mg Oral BID  . sertraline  50 mg Oral q morning - 10a  . vitamin C  500 mg Oral Daily   Continuous Infusions: . sodium chloride 50 mL/hr at 01/23/13 2300    Principal Problem:   Nausea, vomiting and diarrhea Active Problems:   RA (rheumatoid arthritis)   Hypertension   Recurrent colitis due to Clostridium difficile   Sepsis   HCAP (healthcare-associated pneumonia)   AKI (acute kidney injury)   Dehydration   Renal failure, acute on chronic   Hypokalemia    Time spent: 25 minutes.    [facility]  [facility] Pager [phone].   If 8PM-8AM, please contact night-coverage at [email or web address], password [facility] 01/24/2013, 5:16 PM  LOS: 2 days

## 2013-01-24 NOTE — Evaluation (Signed)
Physical Therapy Evaluation Patient Details Name: [name] MRN: [id number] DOB: 03-Mar-1930 Today's Date: 01/24/2013 Time: 1425-1440 PT Time Calculation (min): 15 min  PT Assessment / Plan / Recommendation History of Present Illness  79 yo female with multiple medical hx including MI, spinal fusion and recurrent c diff is admitted with HCAP, sepsis and AKI  Clinical Impression  Pt has generalized weakness and decreased activity tolerance with SOB with wheeze with minimal ambulation.     PT Assessment  Patient needs continued PT services    Follow Up Recommendations  SNF    Does the patient have the potential to tolerate intense rehabilitation      Barriers to Discharge        Equipment Recommendations  None recommended by PT    Recommendations for Other Services     Frequency Min 3X/week    Precautions / Restrictions Precautions Precautions: Fall   Pertinent Vitals/Pain No c/o pain      Mobility  Bed Mobility Bed Mobility: Sit to Supine Sit to Supine: 3: Mod assist Details for Bed Mobility Assistance: multimodal cues for technique and hand placement for sitting to supine with assist to lift legs up onto bed. Transfers Transfers: Sit to Stand;Stand to Sit Sit to Stand: 3: Mod assist;With upper extremity assist;From chair/3-in-1 Stand to Sit: 3: Mod assist;With upper extremity assist;To bed Details for Transfer Assistance: pt need assist by pulling up on pad to intiate lift off from chair Ambulation/Gait Ambulation/Gait Assistance: 4: Min assist Ambulation Distance (Feet): 15 Feet Assistive device: Rolling walker Ambulation/Gait Assistance Details: assist for encouragement to continue and to advance RW Gait Pattern: Step-to pattern;Decreased step length - right;Decreased step length - left;Shuffle;Trunk flexed Gait velocity: decreased General Gait Details: Pt very SOB with mild audible wheezing with ambulation.  She sat on edge of bed for a few minute to regain  breathing regularity    Exercises General Exercises - Lower Extremity Ankle Circles/Pumps: AROM;Both;5 reps;Seated Long Arc Quad: AROM;Both;5 reps;Seated Hip Flexion/Marching: AROM;5 reps;Seated   PT Diagnosis: Difficulty walking;Abnormality of gait;Generalized weakness  PT Problem List: Decreased activity tolerance;Cardiopulmonary status limiting activity PT Treatment Interventions: Functional mobility training;Therapeutic activities;Therapeutic exercise     PT Goals(Current goals can be found in the care plan section) Acute Rehab PT Goals Patient Stated Goal: go back to [facility] PT Goal Formulation: With patient Time For Goal Achievement: 02/07/13 Potential to Achieve Goals: Good  Visit Information  Last PT Received On: 01/24/13 Assistance Needed: +1 History of Present Illness: 79 yo female with multiple medical hx including MI, spinal fusion and recurrent c diff is admitted with HCAP, sepsis and AKI       Prior Functioning  Home Living Family/patient expects to be discharged to:: Skilled nursing facility Additional Comments: Pt walked with her walker, but spent most of day in wheelchair Communication Communication: No difficulties    Cognition  Cognition Arousal/Alertness: Awake/alert Behavior During Therapy: WFL for tasks assessed/performed Overall Cognitive Status: Within Functional Limits for tasks assessed    Extremity/Trunk Assessment Upper Extremity Assessment Upper Extremity Assessment: Overall WFL for tasks assessed Lower Extremity Assessment Lower Extremity Assessment: Overall WFL for tasks assessed (generalized mucle atrophy) Cervical / Trunk Assessment Cervical / Trunk Assessment: Kyphotic;Other exceptions Cervical / Trunk Exceptions: generalized atrophy and  well healed scars from spinal surgery   Balance Balance Balance Assessed: Yes Static Sitting Balance Static Sitting - Balance Support: Feet supported Static Sitting - Level of Assistance: 5:  Stand by assistance Static Standing Balance Static Standing -  Balance Support: Bilateral upper extremity supported;During functional activity Static Standing - Level of Assistance: 4: Min assist  End of Session PT - End of Session Activity Tolerance: Treatment limited secondary to medical complications (Comment) (pt SOB with exertion) Patient left: in bed;with call bell/phone within reach  GP    Rosey Bath K. [place], [place] [phone] 01/24/2013, 2:53 PM

## 2013-01-24 NOTE — Progress Notes (Signed)
Clinical Social Work Department BRIEF PSYCHOSOCIAL ASSESSMENT 01/24/2013  Patient:  [name], [name]     Account Number:  [id number]     Admit date:  01/22/2013  Clinical Social Worker:  Hattie Perch  Date/Time:  01/24/2013 12:00 M  Referred by:  Physician  Date Referred:  01/24/2013 Referred for  SNF Placement   Other Referral:   Interview type:  Patient Other interview type:    PSYCHOSOCIAL DATA Living Status:  FACILITY Admitted from facility:  [facility] Level of care:  Skilled Nursing Facility Primary support name:  Lilu Mcglown Primary support relationship to patient:  CHILD, ADULT Degree of support available:   good    CURRENT CONCERNS Current Concerns  Post-Acute Placement   Other Concerns:    SOCIAL WORK ASSESSMENT / PLAN CSW met with patient. patient is alert and oriented X3. patient states that she has lived at [facility] for the last year and has been very happy with her care there. she is excited to return upon medical stability.   Assessment/plan status:   Other assessment/ plan:   Information/referral to community resources:    PATIENT'S/FAMILY'S RESPONSE TO PLAN OF CARE: patient is very happy with her care at [facility] and anxious to return there. CSW spoke with Tammy at [facility] and she confirms that patient is a long term care resident and will be fine to return upon discharge.

## 2013-01-25 DIAGNOSIS — J189 Pneumonia, unspecified organism: Secondary | ICD-10-CM | POA: Diagnosis not present

## 2013-01-25 DIAGNOSIS — D649 Anemia, unspecified: Secondary | ICD-10-CM | POA: Diagnosis not present

## 2013-01-25 DIAGNOSIS — R5383 Other fatigue: Secondary | ICD-10-CM | POA: Diagnosis not present

## 2013-01-25 DIAGNOSIS — F411 Generalized anxiety disorder: Secondary | ICD-10-CM | POA: Diagnosis not present

## 2013-01-25 DIAGNOSIS — K219 Gastro-esophageal reflux disease without esophagitis: Secondary | ICD-10-CM | POA: Diagnosis not present

## 2013-01-25 DIAGNOSIS — M62838 Other muscle spasm: Secondary | ICD-10-CM | POA: Diagnosis not present

## 2013-01-25 DIAGNOSIS — F329 Major depressive disorder, single episode, unspecified: Secondary | ICD-10-CM | POA: Diagnosis not present

## 2013-01-25 DIAGNOSIS — I10 Essential (primary) hypertension: Secondary | ICD-10-CM | POA: Diagnosis not present

## 2013-01-25 DIAGNOSIS — E2749 Other adrenocortical insufficiency: Secondary | ICD-10-CM | POA: Diagnosis not present

## 2013-01-25 DIAGNOSIS — M6281 Muscle weakness (generalized): Secondary | ICD-10-CM | POA: Diagnosis not present

## 2013-01-25 DIAGNOSIS — R197 Diarrhea, unspecified: Secondary | ICD-10-CM | POA: Diagnosis not present

## 2013-01-25 DIAGNOSIS — N179 Acute kidney failure, unspecified: Secondary | ICD-10-CM | POA: Diagnosis not present

## 2013-01-25 DIAGNOSIS — Z9181 History of falling: Secondary | ICD-10-CM | POA: Diagnosis not present

## 2013-01-25 DIAGNOSIS — R1312 Dysphagia, oropharyngeal phase: Secondary | ICD-10-CM | POA: Diagnosis not present

## 2013-01-25 DIAGNOSIS — K449 Diaphragmatic hernia without obstruction or gangrene: Secondary | ICD-10-CM | POA: Diagnosis not present

## 2013-01-25 DIAGNOSIS — R0602 Shortness of breath: Secondary | ICD-10-CM | POA: Diagnosis not present

## 2013-01-25 DIAGNOSIS — I6990 Unspecified sequelae of unspecified cerebrovascular disease: Secondary | ICD-10-CM | POA: Diagnosis not present

## 2013-01-25 DIAGNOSIS — K228 Other specified diseases of esophagus: Secondary | ICD-10-CM | POA: Diagnosis not present

## 2013-01-25 DIAGNOSIS — E785 Hyperlipidemia, unspecified: Secondary | ICD-10-CM | POA: Diagnosis not present

## 2013-01-25 DIAGNOSIS — K279 Peptic ulcer, site unspecified, unspecified as acute or chronic, without hemorrhage or perforation: Secondary | ICD-10-CM | POA: Diagnosis not present

## 2013-01-25 DIAGNOSIS — E860 Dehydration: Secondary | ICD-10-CM | POA: Diagnosis not present

## 2013-01-25 DIAGNOSIS — I69959 Hemiplegia and hemiparesis following unspecified cerebrovascular disease affecting unspecified side: Secondary | ICD-10-CM | POA: Diagnosis not present

## 2013-01-25 DIAGNOSIS — E039 Hypothyroidism, unspecified: Secondary | ICD-10-CM | POA: Diagnosis not present

## 2013-01-25 DIAGNOSIS — R1314 Dysphagia, pharyngoesophageal phase: Secondary | ICD-10-CM | POA: Diagnosis not present

## 2013-01-25 DIAGNOSIS — M069 Rheumatoid arthritis, unspecified: Secondary | ICD-10-CM | POA: Diagnosis not present

## 2013-01-25 DIAGNOSIS — R1310 Dysphagia, unspecified: Secondary | ICD-10-CM | POA: Diagnosis not present

## 2013-01-25 LAB — BASIC METABOLIC PANEL
CO2: 22 mEq/L (ref 19–32)
Calcium: 8.2 mg/dL — ABNORMAL LOW (ref 8.4–10.5)
Creatinine, Ser: 1.18 mg/dL — ABNORMAL HIGH (ref 0.50–1.10)
GFR calc Af Amer: 49 mL/min — ABNORMAL LOW (ref >90)
GFR calc non Af Amer: 42 mL/min — ABNORMAL LOW (ref >90)
Sodium: 141 mEq/L (ref 135–145)

## 2013-01-25 LAB — CBC
MCH: 28.7 pg (ref 26.0–34.0)
MCV: 90 fL (ref 78.0–100.0)
Platelets: 194 10*3/uL (ref 150–400)
RBC: 2.89 MIL/uL — ABNORMAL LOW (ref 3.87–5.11)
RDW: 14.9 % (ref 11.5–15.5)

## 2013-01-25 MED ORDER — HYDROCODONE-ACETAMINOPHEN 5-325 MG PO TABS: 1.0000 | ORAL_TABLET | Freq: Four times a day (QID) | ORAL | Status: DC | PRN

## 2013-01-25 MED ORDER — BENZONATATE 100 MG PO CAPS: 100.0000 mg | ORAL_CAPSULE | Freq: Three times a day (TID) | ORAL | Status: DC | PRN

## 2013-01-25 MED ORDER — FAMOTIDINE 10 MG PO TABS: 10.0000 mg | ORAL_TABLET | Freq: Two times a day (BID) | ORAL | Status: DC

## 2013-01-25 MED ORDER — BENZONATATE 100 MG PO CAPS
100.0000 mg | ORAL_CAPSULE | Freq: Three times a day (TID) | ORAL | Status: DC | PRN
  Filled 2013-01-25: qty 1

## 2013-01-25 MED ORDER — LORAZEPAM 0.5 MG PO TABS: ORAL_TABLET | ORAL | Status: DC

## 2013-01-25 NOTE — Evaluation (Signed)
Clinical/Bedside Swallow Evaluation Patient Details  Name: [name] MRN: [id number] Date of Birth: 09-Apr-1930  Today's Date: 01/25/2013 Time: 0910-1000 SLP Time Calculation (min): 50 min  Past Medical History:  Past Medical History  Diagnosis Date  . SOB (shortness of breath)   . MI (myocardial infarction)   . Poor appetite   . Arthritis   . Fatigue   . Right ear pain   . Renal insufficiency   . RA (rheumatoid arthritis)   . PUD (peptic ulcer disease)   . GI bleed 2005  . GERD (gastroesophageal reflux disease)   . Hiatal hernia   . Hypothyroidism   . Osteoporosis   . Depression   . Anxiety   . Coronary artery disease   . CVA (cerebral vascular accident)   . TIA (transient ischemic attack)   . Fall   . DVT of lower extremity (deep venous thrombosis)    Past Surgical History:  Past Surgical History  Procedure Laterality Date  . Nstemi  06/2010  . Spinal fusion surgery    . Knee surgery    . Cardiac catheterization      SHOWED RUPTURE PLAQUE IN THE LAD. THE LAD IS NONOBSTRUCTIVE WITH ONLY 30-40% STENOSIS   HPI:  79 year old female admitted 01/22/13 from SNF due to N/V and weakness.  PMH significant for MI, decreased po intake, RA, GI Bleed, GERD/hiatal hernia, CVA/TIA, recurrent CDiff.  CXR 01/23/13 revealed Low lung volumes without radiographic evidence of acute cardiopulmonary disease.  Pt reported to MD issues with her thyroid gland. Hypothyroid documented during June 2014 admission.  BSE requested to evaluate swallow function and safety.  Inflammation of the thyroid gland may cause swallowing issues due to pressure placed on the laryngeal mechanism.    Assessment / Plan / Recommendation Clinical Impression  Pt exhibits normal oral motor strength and function. No clinical indications of aspiration with any consistency tested.  Pt has documented thyroid issues, which may result in swallowing difficulty due to pressure placed on the laryngeal mechanism.  Recommend MD to  monitor, as pt is aware of difficulty. No overt s/s aspiration at this time, however.  Pt also has documented history of GERD and hiatal hernia.  Reflux precautions posted at [facility].  RN informed of results and recommendations.    Aspiration Risk  Mild    Diet Recommendation Dysphagia 3 (Mechanical Soft);Thin liquid (chop meats)   Liquid Administration via: Cup Medication Administration: Whole meds with liquid Supervision: Patient able to self feed Compensations: Slow rate;Small sips/bites Postural Changes and/or Swallow Maneuvers: Seated upright 90 degrees;Upright 30-60 min after meal    Other  Recommendations Oral Care Recommendations: Oral care QID Other Recommendations: Clarify dietary restrictions   Follow Up Recommendations  Skilled Nursing facility            Pertinent Vitals/Pain No pain reported    Swallow Study Prior Functional Status   Pt reports tolerating soft diet/chopped meats, thin liquids at SNF    General Date of Onset: 01/22/13 HPI: 79 year old female admitted 01/22/13 from SNF due to N/V and weakness.  PMH significant for MI, decreased po intake, RA, GI Bleed, GERD/hiatal hernia, CVA/TIA, recurrent CDiff.  CXR 01/23/13 revealed Low lung volumes without radiographic evidence of acute cardiopulmonary disease.  Pt reported to MD issues with her thyroid gland. Hypothyroid documented during June 2014 admission.  BSE requested to evaluate swallow function and safety.  Inflammation of the thyroid gland may cause swallowing issues due to pressure placed on the  laryngeal mechanism.  Type of Study: Bedside swallow evaluation Previous Swallow Assessment: none found Diet Prior to this Study: Regular Temperature Spikes Noted: No Respiratory Status: Room air History of Recent Intubation: No Behavior/Cognition: Alert;Cooperative;Pleasant mood Oral Cavity - Dentition: Adequate natural dentition Self-Feeding Abilities: Able to feed self Patient Positioning: Upright in  bed Baseline Vocal Quality: Clear Volitional Cough: Strong Volitional Swallow: Able to elicit    Oral/Motor/Sensory Function Overall Oral Motor/Sensory Function: Appears within functional limits for tasks assessed   Ice Chips Ice chips: Within functional limits   Thin Liquid Thin Liquid: Within functional limits Presentation: Self Fed;Cup    Nectar Thick Nectar Thick Liquid: Not tested   Honey Thick Honey Thick Liquid: Not tested   Puree Puree: Within functional limits Presentation: Self Fed;Spoon   Solid   Ulani Degrasse B. Murvin Natal [facility], CCC-SLP [phone] Solid: Within functional limits Presentation: Self Fed Other Comments: Pt reports difficulty with meats. Will modify diet to accommodate this.       Leigh Aurora 01/25/2013,10:09 AM

## 2013-01-25 NOTE — Evaluation (Deleted)
Clinical/Bedside Swallow Evaluation Patient Details  Name: [name] MRN: [id number] Date of Birth: 22-Aug-1929  Today's Date: 01/25/2013 Time:  -     Past Medical History:  Past Medical History  Diagnosis Date  . SOB (shortness of breath)   . MI (myocardial infarction)   . Poor appetite   . Arthritis   . Fatigue   . Right ear pain   . Renal insufficiency   . RA (rheumatoid arthritis)   . PUD (peptic ulcer disease)   . GI bleed 2005  . GERD (gastroesophageal reflux disease)   . Hiatal hernia   . Hypothyroidism   . Osteoporosis   . Depression   . Anxiety   . Coronary artery disease   . CVA (cerebral vascular accident)   . TIA (transient ischemic attack)   . Fall   . DVT of lower extremity (deep venous thrombosis)    Past Surgical History:  Past Surgical History  Procedure Laterality Date  . Nstemi  06/2010  . Spinal fusion surgery    . Knee surgery    . Cardiac catheterization      SHOWED RUPTURE PLAQUE IN THE LAD. THE LAD IS NONOBSTRUCTIVE WITH ONLY 30-40% STENOSIS   HPI:  79 year old female admitted 01/22/13 from SNF due to N/V and weakness.  PMH significant for MI, decreased po intake, RA, GI Bleed, GERD/hiatal hernia, CVA/TIA, recurrent CDiff.  CXR 01/23/13 revealed Low lung volumes without radiographic evidence of acute cardiopulmonary disease.  Pt reported to MD issues with her thyroid gland. Hypothyroid documented during June 2014 admission.  BSE requested to evaluate swallow function and safety.  Inflammation of the thyroid gland may cause swallowing issues due to pressure placed on the laryngeal mechanism.    Assessment / Plan / Recommendation Clinical Impression  Pt exhibits normal oral motor strength and function. No clinical indications of aspiration with any consistency tested.  Pt has documented thyroid issues, which may result in swallowing difficulty due to pressure placed on the laryngeal mechanism.  Recommend MD to monitor, as pt is aware of difficulty.  No overt s/s aspiration at this time, however.  Pt also has documented history of GERD and hiatal hernia.  Reflux precautions posted at [facility].  RN informed of results and recommendations.    Aspiration Risk  Mild    Diet Recommendation Dysphagia 3 (Mechanical Soft);Thin liquid (chop meats)   Liquid Administration via: Cup Medication Administration: Whole meds with liquid Supervision: Patient able to self feed Compensations: Slow rate;Small sips/bites Postural Changes and/or Swallow Maneuvers: Seated upright 90 degrees;Upright 30-60 min after meal    Other  Recommendations Oral Care Recommendations: Oral care QID Other Recommendations: Clarify dietary restrictions   Follow Up Recommendations  Skilled Nursing facility    Frequency and Duration        Pertinent Vitals/Pain No pain reported   Swallow Study Prior Functional Status   Pt tolerating soft diet with chopped meats, thin liquids at SNF    General Date of Onset: 01/22/13 HPI: 79 year old female admitted 01/22/13 from SNF due to N/V and weakness.  PMH significant for MI, decreased po intake, RA, GI Bleed, GERD/hiatal hernia, CVA/TIA, recurrent CDiff.  CXR 01/23/13 revealed Low lung volumes without radiographic evidence of acute cardiopulmonary disease.  Pt reported to MD issues with her thyroid gland. Hypothyroid documented during June 2014 admission.  BSE requested to evaluate swallow function and safety.  Inflammation of the thyroid gland may cause swallowing issues due to pressure placed on the  laryngeal mechanism.  Type of Study: Bedside swallow evaluation Previous Swallow Assessment: none found Diet Prior to this Study: Regular Temperature Spikes Noted: No Respiratory Status: Room air History of Recent Intubation: No Behavior/Cognition: Alert;Cooperative;Pleasant mood Oral Cavity - Dentition: Adequate natural dentition Self-Feeding Abilities: Able to feed self Patient Positioning: Upright in bed Baseline Vocal Quality:  Clear Volitional Cough: Strong Volitional Swallow: Able to elicit    Oral/Motor/Sensory Function Overall Oral Motor/Sensory Function: Appears within functional limits for tasks assessed   Ice Chips Ice chips: Within functional limits   Thin Liquid Thin Liquid: Within functional limits Presentation: Self Fed;Cup    Nectar Thick Nectar Thick Liquid: Not tested   Honey Thick Honey Thick Liquid: Not tested   Puree Puree: Within functional limits Presentation: Self Fed;Spoon   Solid   GO    Solid: Within functional limits Presentation: Self Fed Other Comments: Pt reports difficulty with meats. Will modify diet to accommodate this.      Annamay Laymon B. Murvin Natal [facility], CCC-SLP [phone]  [name] 01/25/2013,10:06 AM

## 2013-01-25 NOTE — Discharge Summary (Signed)
Physician Discharge Summary  [name] ION:[id number] DOB: October 24, 1929 DOA: 01/22/2013  PCP: Oneal Grout, MD  Admit date: 01/22/2013 Discharge date: 01/25/2013  Time spent: Greater than 30 minutes  Recommendations for Outpatient Follow-up:  1. Dr. Oneal Grout, PCP in 3 days with repeat labs (CBC & BMP). 2. Consider outpatient GI consultation for evaluation of chronic anemia and if she continues to have further nausea.  Discharge Diagnoses:  Principal Problem:   Nausea, vomiting and diarrhea Active Problems:   RA (rheumatoid arthritis)   Hypertension   Recurrent colitis due to Clostridium difficile   Sepsis   HCAP (healthcare-associated pneumonia)   AKI (acute kidney injury)   Dehydration   Renal failure, acute on chronic   Hypokalemia   Discharge Condition: Improved & Stable  Diet recommendation: Dysphagia 3 diet & thin liquids.  Filed Weights   01/23/13 0015  Weight: 72.5 kg (159 lb 13.3 oz)    History of present illness:  79 year old female patient with history of recurrent C. difficile (December 2013 & June 2014) and completed 14 day course of oral vancomycin on 01/06/13, other extensive PMH-chronic kidney disease, rheumatoid arthritis, chronic prednisone treatment, GERD, hypothyroidism, anxiety and depression, CAD, CVA was transferred from nursing facility on 01/22/13 to the [facility] with complaints of generalized weakness, nausea and emesis. Patient states that she has mild intermittent nonproductive cough and denies dyspnea. According to patient, her last BM was a week ago and then she had a loose BM in the hospital on 7/27. Subjective fever and chills. Workup at [facility] 18 K, up from 14 K, creatinine 1.6, negative abdominal x-ray and chest x-ray showed bibasal infiltrates. No record of CT abdomen being done as OP. In the ED, temperature 99.9, pulse 110s and O2 sat 93%. Hospitalist admission was requested for presumed healthcare acquired  pneumonia.  Hospital Course:  1. Diarrhea, nausea & vomiting: Patient is at high risk for recurrent C. difficile. C. difficile PCR negative. Continue probiotics. DCed PPIs. Antibiotics which were started for presumed pneumonia were discontinued as there was no convincing evidence of pneumonia. Would be very conservative and careful in starting any antibiotics unless deemed absolutely necessary. No further nausea or vomiting. ? Acute viral GE. If patient continues to have chronic nausea, may consider GI consultation as OP. 2. Dehydration: Secondary to nausea, vomiting and diarrhea. Improving. Resolved. 3. Acute on stage III chronic kidney disease: Creatinine up from 1.13 on 6/25-1.72. Likely secondary to dehydration. Resolved after IV fluids. 4. Hypokalemia: Replete and follow. Corrected. 5. Anemia: Likely secondary to chronic kidney disease. Hemoglobin dropped from 10.1 > 8.7? Dilutional. No bleeding. Transfuse only if hemoglobin less than 7 g per DL. No melena. Consider outpatient GI consultation for evaluation of anemia, if not recently done. 6. Hypertension: Controlled. Continue home amlodipine and metoprolol. 7. Hypothyroidism: Continue Synthroid. 8. Leukocytosis: Secondary to problem #1. Management as above. Decreasing. 9. Rheumatoid arthritis/chronic prednisone/Adrenal insufficiency: Continue home dose of prednisone. 10. GERD/large hiatal hernia: DC PPIs and place on by mouth Pepcid. 11. Nonproductive cough: No clinical evidence of pneumonia. Tessalon Perles. 12. Dysphagia: Patient was complaining of some difficulty in swallowing. Speech therapy evaluated and recommended dysphagia 3 diet and thin liquids. 13. History of C. difficile x2 (December 2013 & June 2014): Has completed course of vancomycin prior to this hospitalization. C. difficile PCR this admission was negative. Management as above.   Procedures:  None   Consultations:  Speech therapy  Discharge Exam:  Complaints: Mild  intermittent dry cough. No dyspnea. No  nausea or vomiting or abdominal pain. Soft BMs without melena.  Filed Vitals:   01/24/13 2110 01/24/13 2233 01/25/13 0500 01/25/13 1042  BP: 124/60  126/65 129/61  Pulse: 90 96 74 81  Temp: 99.3 F (37.4 C)  98.2 F (36.8 C) 98.4 F (36.9 C)  TempSrc: Oral  Oral   Resp: 20  18 18   Height:      Weight:      SpO2: 96%  100% 96%     General exam: Comfortable. Elderly chronically ill-looking female patient.   Respiratory system: Slightly reduced breath sounds in the bases but otherwise clear to auscultation. No increased work of breathing.   Cardiovascular system: S1 & S2 heard, RRR. No JVD, murmurs, gallops, clicks or pedal edema. Telemetry: Sinus rhythm.   Gastrointestinal system: Abdomen is nondistended, soft and nontender. Normal bowel sounds heard.   Central nervous system: Alert and oriented. No focal neurological deficits.   Extremities: Symmetric 5 x 5 power. Limited movement of upper extremities secondary to arthritic changes. No acute findings.   Discharge Instructions      Discharge Orders   Future Appointments Provider Department Dept Phone   01/28/2013 3:00 PM Carlus Pavlov, MD [facility] PR[facility] ENDOCRINOLOGY [phone]   Future Orders Complete By Expires     Call MD for:  difficulty breathing, headache or visual disturbances  As directed     Call MD for:  extreme fatigue  As directed     Call MD for:  persistant dizziness or light-headedness  As directed     Call MD for:  persistant nausea and vomiting  As directed     Call MD for:  severe uncontrolled pain  As directed     Call MD for:  temperature >100.4  As directed     Call MD for:  As directed     Comments:      Worsening diarrhea.    Discharge instructions  As directed     Comments:      1. DIET: Dysphagia 3 diet and thin liquids    Increase activity slowly  As directed         Medication List    STOP taking these medications       esomeprazole 40  MG capsule  Commonly known as:  NEXIUM     vancomycin 50 mg/mL oral solution  Commonly known as:  VANCOCIN      TAKE these medications       amLODipine 10 MG tablet  Commonly known as:  NORVASC  Take 1 tablet (10 mg total) by mouth daily.     aspirin 81 MG chewable tablet  Chew 81 mg by mouth daily.     benzonatate 100 MG capsule  Commonly known as:  TESSALON  Take 1 capsule (100 mg total) by mouth 3 (three) times daily as needed for cough.     famotidine 10 MG tablet  Commonly known as:  PEPCID  Take 1 tablet (10 mg total) by mouth 2 (two) times daily.     ferrous sulfate 325 (65 FE) MG tablet  Take 325 mg by mouth daily with breakfast.     Folic Acid-Vit B6-Vit B12 2.5-25-1 MG Tabs  Commonly known as:  FOLBEE  Take 1 tablet by mouth daily.     HYDROcodone-acetaminophen 5-325 MG per tablet  Commonly known as:  NORCO/VICODIN  Take 1 tablet by mouth every 6 (six) hours as needed for pain. (Moderate-severe pain)     levothyroxine 50  MCG tablet  Commonly known as:  SYNTHROID, LEVOTHROID  Take 50 mcg by mouth daily.     LORazepam 0.5 MG tablet  Commonly known as:  ATIVAN  Take 1 tablet every night at bedtime as needed for anxiety     metoprolol 50 MG tablet  Commonly known as:  LOPRESSOR  Take 50 mg by mouth 2 (two) times daily.     multivitamin tablet  Take 1 tablet by mouth daily.     predniSONE 10 MG tablet  Commonly known as:  DELTASONE  Take 10 mg by mouth daily.     promethazine 25 MG tablet  Commonly known as:  PHENERGAN  Take 25 mg by mouth every 6 (six) hours as needed. nausea     rosuvastatin 20 MG tablet  Commonly known as:  CRESTOR  Take 20 mg by mouth every evening.     saccharomyces boulardii 250 MG capsule  Commonly known as:  FLORASTOR  Take 1 capsule (250 mg total) by mouth 2 (two) times daily.     sertraline 50 MG tablet  Commonly known as:  ZOLOFT  Take 50 mg by mouth every morning.     vitamin C 500 MG tablet  Commonly known as:   ASCORBIC ACID  Take 500 mg by mouth daily.       Follow-up Information   Follow up with Oneal Grout, MD. Schedule an appointment as soon as possible for a visit in 3 days. (To be seen with repeat labs (CBC & BMP).)    Contact information:   [address] [place] [address] [phone]        The results of significant diagnostics from this hospitalization (including imaging, microbiology, ancillary and laboratory) are listed below for reference.    Significant Diagnostic Studies: Dg Chest 2 View  01/23/2013   *RADIOLOGY REPORT*  Clinical Data: Fever and cough.  CHEST - 2 VIEW  Comparison: Chest x-ray 12/18/2012.  Findings: Lung volumes are low.  Linear opacity in the right mid lung is most compatible with subsegmental atelectasis.  Linear opacity the periphery of the left base is unchanged and most compatible with chronic scarring.  No acute consolidative airspace disease.  No pleural effusions.  No evidence of pulmonary edema. Heart size is upper limits of normal.  Large hiatal hernia again noted.  Upper mediastinal contours are within normal limits. Atherosclerosis in the thoracic aorta.  Orthopedic fixation hardware throughout the visualized thoracolumbar spine.  IMPRESSION: 1.  Low lung volumes without radiographic evidence of acute cardiopulmonary disease. 2.  Increased subsegmental atelectasis in the right mid lung. 3.  Large hiatal hernia. 4.  Atherosclerosis.   Original Report Authenticated By: Trudie Reed, M.D.    Microbiology: Recent Results (from the past 240 hour(s))  CULTURE, BLOOD (ROUTINE X 2)     Status: None   Collection Time    01/22/13 11:20 PM      Result Value Range Status   Specimen Description BLOOD RIGHT ANTECUBITAL   Final   Special Requests BOTTLES DRAWN AEROBIC AND ANAEROBIC 5CC   Final   Culture  Setup Time 01/23/2013 15:16   Final   Culture     Final   Value:        BLOOD CULTURE RECEIVED NO GROWTH TO DATE CULTURE WILL BE HELD FOR 5 DAYS BEFORE  ISSUING A FINAL NEGATIVE REPORT   Report Status PENDING   Incomplete  CULTURE, BLOOD (ROUTINE X 2)     Status: None  Collection Time    01/22/13 11:25 PM      Result Value Range Status   Specimen Description BLOOD RIGHT HAND   Final   Special Requests BOTTLES DRAWN AEROBIC AND ANAEROBIC 5CC   Final   Culture  Setup Time 01/23/2013 15:16   Final   Culture     Final   Value:        BLOOD CULTURE RECEIVED NO GROWTH TO DATE CULTURE WILL BE HELD FOR 5 DAYS BEFORE ISSUING A FINAL NEGATIVE REPORT   Report Status PENDING   Incomplete  MRSA PCR SCREENING     Status: None   Collection Time    01/23/13 12:46 AM      Result Value Range Status   MRSA by PCR NEGATIVE  NEGATIVE Final   Comment:            The GeneXpert MRSA Assay (FDA     approved for NASAL specimens     only), is one component of a     comprehensive MRSA colonization     surveillance program. It is not     intended to diagnose MRSA     infection nor to guide or     monitor treatment for     MRSA infections.  CLOSTRIDIUM DIFFICILE BY PCR     Status: None   Collection Time    01/23/13  8:24 AM      Result Value Range Status   C difficile by pcr NEGATIVE  NEGATIVE Final     Labs: Basic Metabolic Panel:  Recent Labs Lab 01/23/13 0447 01/24/13 0424 01/25/13 0440  NA 133* 138 141  K 3.4* 4.0 4.2  CL 99 110 112  CO2 24 22 22   GLUCOSE 125* 99 98  BUN 27* 24* 19  CREATININE 1.72* 1.37* 1.18*  CALCIUM 8.3* 7.7* 8.2*   Liver Function Tests:  Recent Labs Lab 01/23/13 0447  AST 13  ALT 9  ALKPHOS 55  BILITOT 0.8  PROT 5.6*  ALBUMIN 2.5*   No results found for this basename: LIPASE, AMYLASE,  in the last 168 hours No results found for this basename: AMMONIA,  in the last 168 hours CBC:  Recent Labs Lab 01/23/13 0447 01/24/13 0424 01/25/13 0440  WBC 17.5* 14.8* 12.1*  HGB 10.1* 8.7* 8.3*  HCT 31.3* 26.5* 26.0*  MCV 89.2 90.8 90.0  PLT 182 149* 194   Cardiac Enzymes: No results found for this  basename: CKTOTAL, CKMB, CKMBINDEX, TROPONINI,  in the last 168 hours BNP: BNP (last 3 results)  Recent Labs  03/17/12 0530 03/18/12 0400 12/21/12 0359  PROBNP 1429.0* 3700.0* 1164.0*   CBG: No results found for this basename: GLUCAP,  in the last 168 hours  Additional labs:  HIV antibody: Nonreactive.   Signed:  Tennessee Perra  [facility] 01/25/2013, 1:00 PM

## 2013-01-25 NOTE — Progress Notes (Addendum)
Patient cleared for discharge. Packet copied and placed in [place]. ptar called for transportation. Patient very excited to be going back to [facility]. CSW called patient's son, Ramon Dredge, left voicemail informing of discharge.  Letonya Mangels C. Shubh Chiara MSW, LCSW [phone]

## 2013-01-26 ENCOUNTER — Non-Acute Institutional Stay (SKILLED_NURSING_FACILITY): Payer: Medicare Other | Admitting: Adult Health

## 2013-01-26 ENCOUNTER — Encounter: Payer: Self-pay | Admitting: Adult Health

## 2013-01-26 DIAGNOSIS — E2740 Unspecified adrenocortical insufficiency: Secondary | ICD-10-CM

## 2013-01-26 DIAGNOSIS — M069 Rheumatoid arthritis, unspecified: Secondary | ICD-10-CM | POA: Diagnosis not present

## 2013-01-26 DIAGNOSIS — I10 Essential (primary) hypertension: Secondary | ICD-10-CM | POA: Diagnosis not present

## 2013-01-26 DIAGNOSIS — E2749 Other adrenocortical insufficiency: Secondary | ICD-10-CM

## 2013-01-26 DIAGNOSIS — D649 Anemia, unspecified: Secondary | ICD-10-CM

## 2013-01-26 DIAGNOSIS — E039 Hypothyroidism, unspecified: Secondary | ICD-10-CM

## 2013-01-26 DIAGNOSIS — F329 Major depressive disorder, single episode, unspecified: Secondary | ICD-10-CM

## 2013-01-26 DIAGNOSIS — F32A Depression, unspecified: Secondary | ICD-10-CM

## 2013-01-26 DIAGNOSIS — E785 Hyperlipidemia, unspecified: Secondary | ICD-10-CM

## 2013-01-26 DIAGNOSIS — K279 Peptic ulcer, site unspecified, unspecified as acute or chronic, without hemorrhage or perforation: Secondary | ICD-10-CM | POA: Diagnosis not present

## 2013-01-26 NOTE — Assessment & Plan Note (Signed)
Will continue crestor 20 mg daily

## 2013-01-26 NOTE — Assessment & Plan Note (Signed)
Is stable will continue zoloft 50 mg daily and ativan 0.5 mg nightly as needed for anxiety and will monitor

## 2013-01-26 NOTE — Assessment & Plan Note (Signed)
Will continue iron daily 

## 2013-01-26 NOTE — Assessment & Plan Note (Signed)
Stable will continue pepcid 10 mg twice daily and will monitor

## 2013-01-26 NOTE — Assessment & Plan Note (Signed)
Is stable will continue prednisone 10 mg daily and will monitor status

## 2013-01-26 NOTE — Progress Notes (Signed)
Patient ID: [name], female   DOB: 1930-06-14, 79 y.o.   MRN: [id number]  [facility]  Allergies  Allergen Reactions  . Codeine     sick  . Morphine And Related Other (See Comments)    unknown  . Percocet (Oxycodone-Acetaminophen)     unknown  . Plavix (Clopidogrel Bisulfate) Other (See Comments)    unknown  . Sulfur Other (See Comments)    unknown     Chief Complaint  Patient presents with  . Hospitalization Follow-up    HPI: She has been hospitalized for nausea and vomiting. She has improved and has returned back to the facility this more than likely represent a long term placement for her. She is able to eat and drink and states that she is feeling better.   Past Medical History  Diagnosis Date  . SOB (shortness of breath)   . MI (myocardial infarction)   . Poor appetite   . Arthritis   . Fatigue   . Right ear pain   . Renal insufficiency   . RA (rheumatoid arthritis)   . PUD (peptic ulcer disease)   . GI bleed 2005  . GERD (gastroesophageal reflux disease)   . Hiatal hernia   . Hypothyroidism   . Osteoporosis   . Depression   . Anxiety   . Coronary artery disease   . CVA (cerebral vascular accident)   . TIA (transient ischemic attack)   . Fall   . DVT of lower extremity (deep venous thrombosis)     Past Surgical History  Procedure Laterality Date  . Nstemi  06/2010  . Spinal fusion surgery    . Knee surgery    . Cardiac catheterization      SHOWED RUPTURE PLAQUE IN THE LAD. THE LAD IS NONOBSTRUCTIVE WITH ONLY 30-40% STENOSIS    VITAL SIGNS BP 138/75  Pulse 70  Ht 5\' 6"  (1.676 m)  Wt 158 lb (71.668 kg)  BMI 25.51 kg/m2   Patient's Medications  New Prescriptions   No medications on file  Previous Medications   AMLODIPINE (NORVASC) 10 MG TABLET    Take 1 tablet (10 mg total) by mouth daily.   ASPIRIN 81 MG CHEWABLE TABLET    Chew 81 mg by mouth daily.   BENZONATATE (TESSALON) 100 MG CAPSULE    Take 1 capsule (100 mg total) by mouth 3  (three) times daily as needed for cough.   FAMOTIDINE (PEPCID) 10 MG TABLET    Take 1 tablet (10 mg total) by mouth 2 (two) times daily.   FERROUS SULFATE 325 (65 FE) MG TABLET    Take 325 mg by mouth daily with breakfast.   FOLIC ACID-VIT B6-VIT B12 (FOLBEE) 2.5-25-1 MG TABS    Take 1 tablet by mouth daily.   HYDROCODONE-ACETAMINOPHEN (NORCO/VICODIN) 5-325 MG PER TABLET    Take 1 tablet by mouth every 6 (six) hours as needed for pain. (Moderate-severe pain)   LEVOTHYROXINE (SYNTHROID, LEVOTHROID) 50 MCG TABLET    Take 50 mcg by mouth daily.    LORAZEPAM (ATIVAN) 0.5 MG TABLET    Take 1 tablet every night at bedtime as needed for anxiety   METOPROLOL (LOPRESSOR) 50 MG TABLET    Take 50 mg by mouth 2 (two) times daily.    MULTIPLE VITAMIN (MULTIVITAMIN) TABLET    Take 1 tablet by mouth daily.     PREDNISONE (DELTASONE) 10 MG TABLET    Take 10 mg by mouth daily.   PROMETHAZINE (PHENERGAN) 25 MG  TABLET    Take 25 mg by mouth every 6 (six) hours as needed. nausea   ROSUVASTATIN (CRESTOR) 20 MG TABLET    Take 20 mg by mouth every evening.    SACCHAROMYCES BOULARDII (FLORASTOR) 250 MG CAPSULE    Take 1 capsule (250 mg total) by mouth 2 (two) times daily.   SERTRALINE (ZOLOFT) 50 MG TABLET    Take 50 mg by mouth every morning.   VITAMIN C (ASCORBIC ACID) 500 MG TABLET    Take 500 mg by mouth daily.  Modified Medications   No medications on file  Discontinued Medications   No medications on file    SIGNIFICANT DIAGNOSTIC EXAMS 12-18-12: ct of abdomen and pelvis: 1. No acute findings identified within the abdomen or pelvis. 2.  Large hiatal hernia.  01-23-13: chest x-ray: 1.  Low lung volumes without radiographic evidence of acute cardiopulmonary disease.2.  Increased subsegmental atelectasis in the right mid lung.3.  Large hiatal hernia. 4.  Atherosclerosis.    LABS REVIEWED:    12-20-12: tsh 0.374 01-25-13: wbc 12.1; hgb 8.3; hct 26.0; mcv 90.0; plt 194; glucose 98; bun 19; creat 1.18;  k+4.2;na++141  Review of Systems  Constitutional: Negative for malaise/fatigue.  Respiratory: Negative for cough and shortness of breath.   Cardiovascular: Negative for chest pain.  Gastrointestinal: Positive for nausea. Negative for heartburn, abdominal pain, diarrhea and constipation.  Musculoskeletal: Negative for myalgias and joint pain.  Skin: Negative.   Neurological: Negative for headaches.  Psychiatric/Behavioral: Negative for depression. The patient does not have insomnia.     Physical Exam  Constitutional: She appears well-developed and well-nourished.  Neck: Neck supple. No JVD present. No thyromegaly present.  Cardiovascular: Normal rate, regular rhythm and intact distal pulses.   Respiratory: Effort normal and breath sounds normal. No respiratory distress.  GI: Soft. Bowel sounds are normal. She exhibits no distension. There is no tenderness.  Musculoskeletal: She exhibits no edema.  Is able to move extremities  Neurological: She is alert.  Skin: Skin is warm.      ASSESSMENT/ PLAN:  Adrenal insufficiency Is stable will continue prednisone 10 mg daily and will monitor status   Hypertension Is stable will continue norvasc 10 mg daily; lopressor 50 mg twice daily asa 81 mg daily and will monitor   RA (rheumatoid arthritis) Is stable will continue prednisone 10 mg daily and will monitor   PUD (peptic ulcer disease) Stable will continue pepcid 10 mg twice daily and will monitor  Depression Is stable will continue zoloft 50 mg daily and ativan 0.5 mg nightly as needed for anxiety and will monitor   Hypothyroidism Will continue synthroid 50 mcg daily   Anemia Will continue iron daily   Dyslipidemia Will continue crestor 20 mg daily    Time spent with patient 50 minutes

## 2013-01-26 NOTE — Assessment & Plan Note (Signed)
Is stable will continue prednisone 10 mg daily and will monitor

## 2013-01-26 NOTE — Assessment & Plan Note (Signed)
Is stable will continue norvasc 10 mg daily; lopressor 50 mg twice daily asa 81 mg daily and will monitor

## 2013-01-26 NOTE — Assessment & Plan Note (Signed)
Will continue synthroid 50 mcg daily  

## 2013-01-28 ENCOUNTER — Encounter: Payer: Self-pay | Admitting: Internal Medicine

## 2013-01-28 ENCOUNTER — Ambulatory Visit (INDEPENDENT_AMBULATORY_CARE_PROVIDER_SITE_OTHER): Payer: Medicare Other | Admitting: Internal Medicine

## 2013-01-28 VITALS — BP 118/64 | HR 79 | Temp 98.5°F | Resp 12 | Wt 159.0 lb

## 2013-01-28 DIAGNOSIS — R1310 Dysphagia, unspecified: Secondary | ICD-10-CM | POA: Diagnosis not present

## 2013-01-28 NOTE — Patient Instructions (Addendum)
We will need to check a test named barium swallow to see if the problems with swallowing come from the inside or the outside of your esophagus. We will only schedule a followup appointment if the test shows that the problem comes from the thyroid. In that case, we might need to repeat the thyroid ultrasound.   Please take Levothyroxine EVERY DAY, with water, >30 min before breakfast, separated by >4h from anti acid medication, calcium, iron, multivitamins.

## 2013-01-28 NOTE — Progress Notes (Addendum)
Patient ID: [name], female   DOB: 1929/09/25, 79 y.o.   MRN: [id number]   HPI  [name] is a 79 y.o.-year-old female, referred by her PCP, Dr. Oneal Grout, for evaluation for thyroid nodule.   She was told she had a thyroid nodule in 2011. She has a thyroid U/S in 2011 in Dr. Lanell Matar office. She was told that this would not bother her. She has a complicated medical history, significant for rheumatoid arthritis, on chronic prednisone treatment 10 mg daily, with subsequent secondary adrenal insufficiency, with 3 admissions this year, with sepsis and healthcare associated pneumonia, last being at the end of last month for nausea vomiting and dehydration patient also presented with acute kidney injury.   We obtained the report of her Thyroid U/S from Dr Lanell Matar office - it was performed on 04/30/2010. Right lobe measures 3.4 x 1.1 x 1.3 cm,. There is a tiny nodule within the upper pole that measures up to 4 mm in size. The isthmus appears within normal limits. The left lobe measures 2.9 x 0.6 x 0.9 cm and demonstrates a homogeneous echotexture pattern  I believe she was referred to me to evaluate whether her former diagnosis of a thyroid nodule might be the cause for her dysphagia. She tells me that she feels "something" in her neck, has hoarseness, and also dysphagia with food, not food-specific, nonpermanent, only occasionally; no odynophagia, no SOB with lying down. She has been having GERD for years. A review of the chart, she was not intubated at last admission and the end of last month. He has had dysphagia in the hospital, and upon evaluation by speech therapy, she was started on dysphagia 3 diet. Reviewing the chart, she also has a large hiatal hernia. She mentions that she has seen gastroenterology in the past and she has had an EGD, however not recently. I do not have this report.  Patient also has hypothyroidism. I reviewed pt's thyroid tests: Lab Results  Component Value Date    TSH 0.374 12/20/2012   TSH 0.916 06/01/2012   TSH 0.742 12/02/2011   TSH 0.421 01/24/2007  She is on 50 mcg on Levothyroxine daily, with water, >30 min before b'fast, but also takes antireflux medication, iron, MVI with breakfast.  Pt c/o: - no heat intolerance/cold intolrance - no tremors - + anxiety >depression - no palpitations - no problems with concentration - + fatigue - no hyperdefecation/constipation - no weight loss - no weight gain - no dry skin  Pt does have a FH of thyroid ds: mother, brother. No FH of thyroid cancer. No h/o radiation tx to head or neck.  No seaweed or kelp,+ steroid use: Pred 10 mg a day x 10 years. No herbal supplements.   I reviewed her chart and she also has a history of recurrent C. Difficile colitis, hypertension, anemia, coronary artery disease-status post MI in 2012, TIA in 11/2011, history of spinal fusion surgery.  She c/o blurry vision, fluctuating. No h/o DM.   Past Medical History  Diagnosis Date  . SOB (shortness of breath)   . MI (myocardial infarction)   . Poor appetite   . Arthritis   . Fatigue   . Right ear pain   . Renal insufficiency   . RA (rheumatoid arthritis)   . PUD (peptic ulcer disease)   . GI bleed 2005  . GERD (gastroesophageal reflux disease)   . Hiatal hernia   . Hypothyroidism   . Osteoporosis   . Depression   .  Anxiety   . Coronary artery disease   . CVA (cerebral vascular accident)   . TIA (transient ischemic attack)   . Fall   . DVT of lower extremity (deep venous thrombosis)     Past Surgical History  Procedure Laterality Date  . Nstemi  06/2010  . Spinal fusion surgery    . Knee surgery    . Cardiac catheterization      SHOWED RUPTURE PLAQUE IN THE LAD. THE LAD IS NONOBSTRUCTIVE WITH ONLY 30-40% STENOSIS   History   Social History  . Marital Status: Widowed    Spouse Name: N/A    Number of Children: 3   Occupational History  . retired   Social History Main Topics  . Smoking status:  Never Smoker   . Smokeless tobacco: Never Used  . Alcohol Use: No  . Drug Use: No   Social History Narrative   Patient used to work at [employer] for 25 years until she retired   She lives by herself in [place] until she had a stroke earlier in 2013 June   Her next of kin is her daughter   She does get physical therapy was everyday at [facility] nursing home      Caffeine use: coffee in the AM   Current Outpatient Prescriptions on File Prior to Visit  Medication Sig Dispense Refill  . amLODipine (NORVASC) 10 MG tablet Take 1 tablet (10 mg total) by mouth daily.  30 tablet  0  . aspirin 81 MG chewable tablet Chew 81 mg by mouth daily.      . benzonatate (TESSALON) 100 MG capsule Take 1 capsule (100 mg total) by mouth 3 (three) times daily as needed for cough.      . famotidine (PEPCID) 10 MG tablet Take 1 tablet (10 mg total) by mouth 2 (two) times daily.      . ferrous sulfate 325 (65 FE) MG tablet Take 325 mg by mouth daily with breakfast.      . Folic Acid-Vit B6-Vit B12 (FOLBEE) 2.5-25-1 MG TABS Take 1 tablet by mouth daily.      Marland Kitchen HYDROcodone-acetaminophen (NORCO/VICODIN) 5-325 MG per tablet Take 1 tablet by mouth every 6 (six) hours as needed for pain. (Moderate-severe pain)  15 tablet  0  . levothyroxine (SYNTHROID, LEVOTHROID) 50 MCG tablet Take 50 mcg by mouth daily.       Marland Kitchen LORazepam (ATIVAN) 0.5 MG tablet Take 1 tablet every night at bedtime as needed for anxiety  5 tablet  0  . metoprolol (LOPRESSOR) 50 MG tablet Take 50 mg by mouth 2 (two) times daily.       . Multiple Vitamin (MULTIVITAMIN) tablet Take 1 tablet by mouth daily.        . predniSONE (DELTASONE) 10 MG tablet Take 10 mg by mouth daily.      . promethazine (PHENERGAN) 25 MG tablet Take 25 mg by mouth every 6 (six) hours as needed. nausea      . rosuvastatin (CRESTOR) 20 MG tablet Take 20 mg by mouth every evening.       . saccharomyces boulardii (FLORASTOR) 250 MG capsule Take 1 capsule (250 mg total) by mouth 2  (two) times daily.  60 capsule  0  . sertraline (ZOLOFT) 50 MG tablet Take 50 mg by mouth every morning.      . vitamin C (ASCORBIC ACID) 500 MG tablet Take 500 mg by mouth daily.      . [DISCONTINUED] enoxaparin (LOVENOX) 30  MG/0.3ML injection Inject 30 mg into the skin every 12 (twelve) hours.       No current facility-administered medications on file prior to visit.   Allergies  Allergen Reactions  . Codeine     sick  . Morphine And Related Other (See Comments)    unknown  . Percocet (Oxycodone-Acetaminophen)     unknown  . Plavix (Clopidogrel Bisulfate) Other (See Comments)    unknown  . Sulfur Other (See Comments)    unknown   Family History  Problem Relation Age of Onset  . Kidney disease Mother   . Kidney disease Brother    ROS: Constitutional: no weight gain/loss, no fatigue, no subjective hyperthermia/hypothermia; + nocturia Eyes: occas. blurry vision, no xerophthalmia ENT: no sore throat, no nodules palpated in throat, +  Dysphagia/no odynophagia, + hoarseness Cardiovascular: no CP/+ SOB/no palpitations/+ leg swelling Respiratory: + cough/+ SOB Gastrointestinal: + N/no V/D/C Musculoskeletal: + muscle/+ joint aches Skin: no rashes Neurological: no tremors/numbness/tingling/dizziness, + occasionally HA Psychiatric: no depression/anxiety  PE: BP 118/64  Pulse 79  Temp(Src) 98.5 F (36.9 C) (Oral)  Resp 12  Wt 159 lb (72.122 kg)  BMI 25.68 kg/m2  SpO2 96% Wt Readings from Last 3 Encounters:  01/28/13 159 lb (72.122 kg)  01/26/13 158 lb (71.668 kg)  01/23/13 159 lb 13.3 oz (72.5 kg)   Constitutional: overweight, in wheelchair, NAD Eyes: PERRLA, EOMI, no exophthalmos ENT: moist mucous membranes, no thyromegaly, no cervical lymphadenopathy Cardiovascular: RRR, No MRG Respiratory: CTA B Gastrointestinal: abdomen soft, NT, ND, BS+ Musculoskeletal: radial subluxation of bilateral hands, strength intact in all 4;  Skin: moist, warm, ecchymoses on  arms Neurological: + tremor with outstretched hands, left arm drop  ASSESSMENT: 1. Thyroid nodule - thyroid U/S 04/30/2010: no goiter, tiny, less than  4 mm thyroid nodule in the right lobe  2. Hypothyroidism  1. Thyroid nodule - I reviewed the 2011 thyroid ultrasound report along with the patient. The thyroid nodule is so small, that it very unlikely that this can cause dysphagia. Even if this were to grow in 2.5 uears, I doubt that it could grow enough to cause significant compression on the esophagus. However, since I cannot be 100% sure of this, I discussed with the patient to check a barium esophagogram to elucidate whether she has esophageal strictures as opposed to external compression as the cause for her dysphagia. I considered performing the thyroid ultrasound, however, if this shows a nodule, the next step would be a barium esophagogram to see if the nodule is the culprit for her dysphagia. Therefore, I would like to skip this step for now. Of course, if there is external compression, and this is deemed to come from the region of the thyroid, we can perform a thyroid ultrasound. I have low suspicion of thyroid cancer in her, and also if this were to be thyroid cancer, I would not suggest treatment because of her age and comorbidities. - Patient agrees with the plan - we were able to schedule her appointment increase for imaging in 6 days - patient informed  2. hypothyroidism - patient is taking her multivitamins, iron and acid reflux medication with breakfast, approximately 30 minutes after her levothyroxine, and I advised her to try to separate these more, by at least 4 hours, to improve absorption of levothyroxine   *RADIOLOGY REPORT*  Clinical Data:Dysphagia  ESOPHAGUS/BARIUM SWALLOW/TABLET STUDY  Fluoroscopy Time: 1 minute  Comparison: None.  Findings: This study was very compromised by the patient's inability to been  placed in various positions during the course of the  exam. Rapid sequence spot films of the cervical esophagus show no evidence of aspiration. There is a posterior pharyngeal diverticulum noted of questionable significance. Moderate tertiary contractions are noted in the mid and distal esophagus. There is a moderate sized hiatal hernia present which was noted on the prior CT. Reflux could not be evaluated since the patient could not remain in the proper position. Also, the patient could not swallow the barium pill at the end of the study.  IMPRESSION:  1. Moderate sized hiatal hernia. Could not evaluate for reflux as noted above. 2. Moderate tertiary contractions in the mid and distal esophagus. 3. Prominent cricopharyngeus muscle. 4. No aspiration. Posterior pharyngeal diverticulum of doubtful significance.   Original Report Authenticated By: Dwyane Dee, M.D.  Even though this was not an optimal study, patient's problem with swallowing does not appear to be caused by abnormality in her thyroid. Would suggest evaluation by GI.

## 2013-01-29 LAB — CULTURE, BLOOD (ROUTINE X 2)
Culture: NO GROWTH
Culture: NO GROWTH

## 2013-01-31 ENCOUNTER — Other Ambulatory Visit: Payer: Medicare Other

## 2013-01-31 ENCOUNTER — Non-Acute Institutional Stay (SKILLED_NURSING_FACILITY): Payer: Medicare Other | Admitting: Internal Medicine

## 2013-01-31 DIAGNOSIS — D649 Anemia, unspecified: Secondary | ICD-10-CM | POA: Diagnosis not present

## 2013-01-31 DIAGNOSIS — K219 Gastro-esophageal reflux disease without esophagitis: Secondary | ICD-10-CM

## 2013-01-31 DIAGNOSIS — E2740 Unspecified adrenocortical insufficiency: Secondary | ICD-10-CM

## 2013-01-31 DIAGNOSIS — R531 Weakness: Secondary | ICD-10-CM | POA: Insufficient documentation

## 2013-01-31 DIAGNOSIS — R5381 Other malaise: Secondary | ICD-10-CM | POA: Diagnosis not present

## 2013-01-31 DIAGNOSIS — E2749 Other adrenocortical insufficiency: Secondary | ICD-10-CM

## 2013-01-31 DIAGNOSIS — R5383 Other fatigue: Secondary | ICD-10-CM

## 2013-01-31 DIAGNOSIS — R1310 Dysphagia, unspecified: Secondary | ICD-10-CM

## 2013-01-31 DIAGNOSIS — E785 Hyperlipidemia, unspecified: Secondary | ICD-10-CM

## 2013-01-31 DIAGNOSIS — I10 Essential (primary) hypertension: Secondary | ICD-10-CM

## 2013-01-31 DIAGNOSIS — M069 Rheumatoid arthritis, unspecified: Secondary | ICD-10-CM

## 2013-01-31 NOTE — Progress Notes (Signed)
Patient ID: [name], female   DOB: 05-17-1930, 79 y.o.   MRN: [id number]  [name]  PCP: Oneal Grout, MD  Code Status: full code  Allergies  Allergen Reactions  . Codeine     sick  . Morphine And Related Other (See Comments)    unknown  . Percocet (Oxycodone-Acetaminophen)     unknown  . Plavix (Clopidogrel Bisulfate) Other (See Comments)    unknown  . Sulfur Other (See Comments)    unknown    Chief Complaint: readmit post hospitalization 7/26-7/29/14  HPI:  79 y/o female patient is a long term resident of the facility and was admitted to hospital with nausea, vomiting and generalized weakness. She has hx of recurrent cdiff, ckd, RA, thyroid problem among others. Her c.diff workup was negative. Her PPI was discontinued. She was started on antibiotics for presumed penumonia and were eventually discontinued. Her diarrhea was thought to be from viral gastroenteritis. She also had ARF and responded well to iv fluids. She was put on dysphagia diet and was sent to SNF She was seen in her room today with her family and dog present. She denies any complaint. She has worked with therapy team today and feels tired now  Review of Systems:  Review of Systems  Constitutional: Negative for malaise/fatigue.  Respiratory: Negative for cough and shortness of breath.   Cardiovascular: Negative for chest pain.  Gastrointestinal: Negative for heartburn, abdominal pain, nausea, vomiting,diarrhea and constipation.  Musculoskeletal: Negative for myalgias and joint pain.  Skin: Negative.   Neurological: Negative for headaches.  Psychiatric/Behavioral: Negative for depression. The patient does not have insomnia.     Past Medical History  Diagnosis Date  . SOB (shortness of breath)   . MI (myocardial infarction)   . Poor appetite   . Arthritis   . Fatigue   . Right ear pain   . Renal insufficiency   . RA (rheumatoid arthritis)   . PUD (peptic ulcer disease)   . GI bleed 2005  .  GERD (gastroesophageal reflux disease)   . Hiatal hernia   . Hypothyroidism   . Osteoporosis   . Depression   . Anxiety   . Coronary artery disease   . CVA (cerebral vascular accident)   . TIA (transient ischemic attack)   . Fall   . DVT of lower extremity (deep venous thrombosis)    Past Surgical History  Procedure Laterality Date  . Nstemi  06/2010  . Spinal fusion surgery    . Knee surgery    . Cardiac catheterization      SHOWED RUPTURE PLAQUE IN THE LAD. THE LAD IS NONOBSTRUCTIVE WITH ONLY 30-40% STENOSIS   Social History:   reports that she has never smoked. She has never used smokeless tobacco. She reports that she does not drink alcohol or use illicit drugs.  Family History  Problem Relation Age of Onset  . Kidney disease Mother   . Kidney disease Brother     Medications: Patient's Medications  New Prescriptions   No medications on file  Previous Medications   AMLODIPINE (NORVASC) 10 MG TABLET    Take 1 tablet (10 mg total) by mouth daily.   ASPIRIN 81 MG CHEWABLE TABLET    Chew 81 mg by mouth daily.   BENZONATATE (TESSALON) 100 MG CAPSULE    Take 1 capsule (100 mg total) by mouth 3 (three) times daily as needed for cough.   FAMOTIDINE (PEPCID) 10 MG TABLET    Take 1 tablet (10  mg total) by mouth 2 (two) times daily.   FERROUS SULFATE 325 (65 FE) MG TABLET    Take 325 mg by mouth daily with breakfast.   FOLIC ACID-VIT B6-VIT B12 (FOLBEE) 2.5-25-1 MG TABS    Take 1 tablet by mouth daily.   HYDROCODONE-ACETAMINOPHEN (NORCO/VICODIN) 5-325 MG PER TABLET    Take 1 tablet by mouth every 6 (six) hours as needed for pain. (Moderate-severe pain)   LEVOTHYROXINE (SYNTHROID, LEVOTHROID) 50 MCG TABLET    Take 50 mcg by mouth daily.    LORAZEPAM (ATIVAN) 0.5 MG TABLET    Take 1 tablet every night at bedtime as needed for anxiety   METOPROLOL (LOPRESSOR) 50 MG TABLET    Take 50 mg by mouth 2 (two) times daily.    MULTIPLE VITAMIN (MULTIVITAMIN) TABLET    Take 1 tablet by mouth  daily.     PREDNISONE (DELTASONE) 10 MG TABLET    Take 10 mg by mouth daily.   PROMETHAZINE (PHENERGAN) 25 MG TABLET    Take 25 mg by mouth every 6 (six) hours as needed. nausea   ROSUVASTATIN (CRESTOR) 20 MG TABLET    Take 20 mg by mouth every evening.    SACCHAROMYCES BOULARDII (FLORASTOR) 250 MG CAPSULE    Take 1 capsule (250 mg total) by mouth 2 (two) times daily.   SERTRALINE (ZOLOFT) 50 MG TABLET    Take 50 mg by mouth every morning.   VITAMIN C (ASCORBIC ACID) 500 MG TABLET    Take 500 mg by mouth daily.  Modified Medications   No medications on file  Discontinued Medications   No medications on file     Physical Exam: Filed Vitals:   01/31/13 1813  BP: 119/68  Pulse: 77  Temp: 97.1 F (36.2 C)  Resp: 18  SpO2: 96%   Constitutional: She appears well-developed and well-nourished.  Neck: Neck supple. No JVD present. No thyromegaly present.  Cardiovascular: Normal rate, regular rhythm and intact distal pulses.   Respiratory: Effort normal and breath sounds normal. No respiratory distress. occassional rhonchi present GI: Soft. Bowel sounds are normal. She exhibits no distension. There is no tenderness.  Musculoskeletal: She exhibits no edema.  Is able to move extremities  Neurological: She is alert.  Skin: Skin is warm.    Labs reviewed: Basic Metabolic Panel:  Recent Labs  16/10/96 0530 03/18/12 0343  01/23/13 0447 01/24/13 0424 01/25/13 0440  NA 140 141  < > 133* 138 141  K 4.2 3.5  < > 3.4* 4.0 4.2  CL 108 109  < > 99 110 112  CO2 23 24  < > 24 22 22   GLUCOSE 135* 95  < > 125* 99 98  BUN 32* 30*  < > 27* 24* 19  CREATININE 1.19* 1.09  < > 1.72* 1.37* 1.18*  CALCIUM 8.1* 8.3*  < > 8.3* 7.7* 8.2*  MG 1.7 1.7  --   --   --   --   PHOS 2.6 2.9  --   --   --   --   < > = values in this interval not displayed. Liver Function Tests:  Recent Labs  04/01/12 0324 04/05/12 0422 01/23/13 0447  AST 16 16 13   ALT 15 17 9   ALKPHOS 51 55 55  BILITOT 0.3 0.4 0.8   PROT 5.6* 5.8* 5.6*  ALBUMIN 2.4* 2.7* 2.5*   No results found for this basename: LIPASE, AMYLASE,  in the last 8760 hours No results found for  this basename: AMMONIA,  in the last 8760 hours CBC:  Recent Labs  04/04/12 1709 06/01/12 1245  12/18/12 1115  01/23/13 0447 01/24/13 0424 01/25/13 0440  WBC 10.6* 19.1*  < > 21.1*  < > 17.5* 14.8* 12.1*  NEUTROABS 7.1 16.1*  --  17.9*  --   --   --   --   HGB 9.9* 10.2*  < > 11.9*  < > 10.1* 8.7* 8.3*  HCT 31.4* 32.3*  < > 36.7  < > 31.3* 26.5* 26.0*  MCV 84.4 86.4  < > 86.2  < > 89.2 90.8 90.0  PLT 211 186  < > 159  < > 182 149* 194  < > = values in this interval not displayed. Cardiac Enzymes:  Recent Labs  04/01/12 0122 04/01/12 0720 04/01/12 1414 12/18/12 1115  CKTOTAL  --  239* 209*  --   CKMB  --  10.5* 8.9*  --   TROPONINI 0.41* 0.39*  --  <0.30   CBG:  Recent Labs  04/04/12 0754 04/05/12 0748 04/05/12 1149  GLUCAP 80 112* 145*    Radiological Exams:  Dg Chest 2 View  01/23/2013   *RADIOLOGY REPORT*  Clinical Data: Fever and cough.  CHEST - 2 VIEW Comparison: Chest x-ray 12/18/2012.  Findings: Lung volumes are low.  Linear opacity in the right mid lung is most compatible with subsegmental atelectasis.  Linear opacity the periphery of the left base is unchanged and most compatible with chronic scarring.  No acute consolidative airspace disease.  No pleural effusions.  No evidence of pulmonary edema. Heart size is upper limits of normal.  Large hiatal hernia again noted.  Upper mediastinal contours are within normal limits. Atherosclerosis in the thoracic aorta.  Orthopedic fixation hardware throughout the visualized thoracolumbar spine.  IMPRESSION: 1.  Low lung volumes without radiographic evidence of acute cardiopulmonary disease. 2.  Increased subsegmental atelectasis in the right mid lung. 3.  Large hiatal hernia. 4.  Atherosclerosis.   Original Report Authenticated By: Trudie Reed, M.D.     Assessment/Plan  Adrenal insufficiency continue prednisone 10 mg daily and will monitor status   Dysphagia On dysphagia level 3 diet for now and has barium swallow scheduled this week to assess for strictures/ mass or hernia  Weakness Besides viral gastroenteritis, Her adrenal insufficiency could have contributed some. Infection were ruled out. Deconditioning is another possibility. Will have her work with therapy  Hypertension continue norvasc 10 mg daily, lopressor 50 mg twice daily and asa 81 mg daily and will monitor bp  GERD  pepcid not helping with her reflux. Will d/c this and have her on omeprazole 20 mg daily for now  RA (rheumatoid arthritis) Is stable will continue prednisone 10 mg daily and will monitor. Continue prn norco  Iron def anemia Continue ferrous sulfate for now. Also continue folic acid supplement  Hypothyroidism Will continue synthroid 50 mcg daily   Depression Pleasant and stable. continue zoloft 50 mg daily and ativan 0.5 mg nightly as needed for anxiety and will monitor   Dyslipidemia Will continue crestor 20 mg daily   Family/ staff Communication: reviewed her care plan with pt, nursing supervisor   Labs/tests ordered- cbc, cmp in a month

## 2013-02-01 ENCOUNTER — Encounter: Payer: Self-pay | Admitting: Internal Medicine

## 2013-02-03 ENCOUNTER — Ambulatory Visit
Admission: RE | Admit: 2013-02-03 | Discharge: 2013-02-03 | Disposition: A | Discharge status: Home / Self Care | Payer: Medicare Other | Source: Ambulatory Visit | Attending: Internal Medicine | Admitting: Internal Medicine

## 2013-02-03 DIAGNOSIS — K228 Other specified diseases of esophagus: Secondary | ICD-10-CM | POA: Diagnosis not present

## 2013-02-03 DIAGNOSIS — K449 Diaphragmatic hernia without obstruction or gangrene: Secondary | ICD-10-CM | POA: Diagnosis not present

## 2013-02-04 ENCOUNTER — Encounter: Payer: Self-pay | Admitting: Internal Medicine

## 2013-02-21 NOTE — Progress Notes (Signed)
This encounter was created in error - please disregard.

## 2013-02-23 ENCOUNTER — Other Ambulatory Visit: Payer: Self-pay | Admitting: *Deleted

## 2013-02-23 DIAGNOSIS — H6120 Impacted cerumen, unspecified ear: Secondary | ICD-10-CM | POA: Diagnosis not present

## 2013-02-23 DIAGNOSIS — K219 Gastro-esophageal reflux disease without esophagitis: Secondary | ICD-10-CM | POA: Diagnosis not present

## 2013-02-23 DIAGNOSIS — D449 Neoplasm of uncertain behavior of unspecified endocrine gland: Secondary | ICD-10-CM | POA: Diagnosis not present

## 2013-02-23 MED ORDER — HYDROCODONE-ACETAMINOPHEN 5-325 MG PO TABS: ORAL_TABLET | ORAL | Status: DC

## 2013-02-23 MED ORDER — LORAZEPAM 0.5 MG PO TABS: ORAL_TABLET | ORAL | Status: DC

## 2013-02-24 ENCOUNTER — Non-Acute Institutional Stay (SKILLED_NURSING_FACILITY): Payer: Medicare Other | Admitting: Adult Health

## 2013-02-24 DIAGNOSIS — E2749 Other adrenocortical insufficiency: Secondary | ICD-10-CM | POA: Diagnosis not present

## 2013-02-24 DIAGNOSIS — R5381 Other malaise: Secondary | ICD-10-CM

## 2013-02-24 DIAGNOSIS — D649 Anemia, unspecified: Secondary | ICD-10-CM

## 2013-02-24 DIAGNOSIS — E785 Hyperlipidemia, unspecified: Secondary | ICD-10-CM

## 2013-02-24 DIAGNOSIS — R1310 Dysphagia, unspecified: Secondary | ICD-10-CM

## 2013-02-24 DIAGNOSIS — K279 Peptic ulcer, site unspecified, unspecified as acute or chronic, without hemorrhage or perforation: Secondary | ICD-10-CM

## 2013-02-24 DIAGNOSIS — I10 Essential (primary) hypertension: Secondary | ICD-10-CM

## 2013-02-24 DIAGNOSIS — E2740 Unspecified adrenocortical insufficiency: Secondary | ICD-10-CM

## 2013-02-24 DIAGNOSIS — K449 Diaphragmatic hernia without obstruction or gangrene: Secondary | ICD-10-CM

## 2013-02-24 DIAGNOSIS — F329 Major depressive disorder, single episode, unspecified: Secondary | ICD-10-CM

## 2013-02-24 DIAGNOSIS — E039 Hypothyroidism, unspecified: Secondary | ICD-10-CM

## 2013-02-24 DIAGNOSIS — R531 Weakness: Secondary | ICD-10-CM

## 2013-02-24 DIAGNOSIS — K219 Gastro-esophageal reflux disease without esophagitis: Secondary | ICD-10-CM

## 2013-02-24 DIAGNOSIS — M069 Rheumatoid arthritis, unspecified: Secondary | ICD-10-CM

## 2013-02-25 DIAGNOSIS — R220 Localized swelling, mass and lump, head: Secondary | ICD-10-CM | POA: Diagnosis not present

## 2013-03-01 ENCOUNTER — Encounter: Payer: Self-pay | Admitting: Adult Health

## 2013-03-01 NOTE — Progress Notes (Signed)
Patient ID: [name], female   DOB: 1930/05/22, 79 y.o.   MRN: [id number]  [place]  Allergies  Allergen Reactions  . Codeine     sick  . Morphine And Related Other (See Comments)    unknown  . Percocet [Oxycodone-Acetaminophen]     unknown  . Plavix [Clopidogrel Bisulfate] Other (See Comments)    unknown  . Sulfur Other (See Comments)    unknown    Chief Complaint  Patient presents with  . Medical Managment of Chronic Issues    HPI: Pt is being followed for medical management of chronic illnesses. Pt denies complaints at present.  Nursing staff has not voiced any concerns/issues at present.   Past Medical History  Diagnosis Date  . SOB (shortness of breath)   . MI (myocardial infarction)   . Poor appetite   . Arthritis   . Fatigue   . Right ear pain   . Renal insufficiency   . RA (rheumatoid arthritis)   . PUD (peptic ulcer disease)   . GI bleed 2005  . GERD (gastroesophageal reflux disease)   . Hiatal hernia   . Hypothyroidism   . Osteoporosis   . Depression   . Anxiety   . Coronary artery disease   . CVA (cerebral vascular accident)   . TIA (transient ischemic attack)   . Fall   . DVT of lower extremity (deep venous thrombosis)     Past Surgical History  Procedure Laterality Date  . Nstemi  06/2010  . Spinal fusion surgery    . Knee surgery    . Cardiac catheterization      SHOWED RUPTURE PLAQUE IN THE LAD. THE LAD IS NONOBSTRUCTIVE WITH ONLY 30-40% STENOSIS    VITAL SIGNS BP 140/78  Pulse 70  Ht 5\' 6"  (1.676 m)  Wt 154 lb 6.4 oz (70.035 kg)  BMI 24.93 kg/m2   Patient's Medications  New Prescriptions   No medications on file  Previous Medications   AMLODIPINE (NORVASC) 10 MG TABLET    Take 1 tablet (10 mg total) by mouth daily.   ASPIRIN 81 MG CHEWABLE TABLET    Chew 81 mg by mouth daily.   BENZONATATE (TESSALON) 100 MG CAPSULE    Take 1 capsule (100 mg total) by mouth 3 (three) times daily as needed for cough.   FAMOTIDINE (PEPCID)  10 MG TABLET    Take 1 tablet (10 mg total) by mouth 2 (two) times daily.   FERROUS SULFATE 325 (65 FE) MG TABLET    Take 325 mg by mouth daily with breakfast.   FOLIC ACID-VIT B6-VIT B12 (FOLBEE) 2.5-25-1 MG TABS    Take 1 tablet by mouth daily.   HYDROCODONE-ACETAMINOPHEN (NORCO/VICODIN) 5-325 MG PER TABLET    Take one tablet by mouth every night at bedtime; Take one tablet by mouth every 6 hours as needed for moderate to severe pain.   LEVOTHYROXINE (SYNTHROID, LEVOTHROID) 50 MCG TABLET    Take 50 mcg by mouth daily.    LORAZEPAM (ATIVAN) 0.5 MG TABLET    Take one tablet by mouth every night at bedtime for anxiety   METOPROLOL (LOPRESSOR) 50 MG TABLET    Take 50 mg by mouth 2 (two) times daily.    MULTIPLE VITAMIN (MULTIVITAMIN) TABLET    Take 1 tablet by mouth daily.     PREDNISONE (DELTASONE) 10 MG TABLET    Take 10 mg by mouth daily.   PROMETHAZINE (PHENERGAN) 25 MG TABLET    Take 25  mg by mouth every 6 (six) hours as needed. nausea   ROSUVASTATIN (CRESTOR) 20 MG TABLET    Take 20 mg by mouth every evening.    SACCHAROMYCES BOULARDII (FLORASTOR) 250 MG CAPSULE    Take 1 capsule (250 mg total) by mouth 2 (two) times daily.   SERTRALINE (ZOLOFT) 50 MG TABLET    Take 50 mg by mouth every morning.   VITAMIN C (ASCORBIC ACID) 500 MG TABLET    Take 500 mg by mouth daily.  Modified Medications   No medications on file  Discontinued Medications   No medications on file    SIGNIFICANT DIAGNOSTIC EXAMS   12-18-12: ct of abdomen and pelvis: 1. No acute findings identified within the abdomen or pelvis. 2.  Large hiatal hernia.  01-23-13: chest x-ray: 1.  Low lung volumes without radiographic evidence of acute cardiopulmonary disease.2.  Increased subsegmental atelectasis in the right mid lung.3.  Large hiatal hernia. 4.  Atherosclerosis.    LABS REVIEWED:    12-20-12: tsh 0.374 01-25-13: wbc 12.1; hgb 8.3; hct 26.0; mcv 90.0; plt 194; glucose 98; bun 19; creat 1.18;  k+4.2;na++141    Review of Systems  Constitutional: Negative.   HENT: Positive for sore throat. Negative for tinnitus.        Hoarseness and mild sore throat-followed by Dr. Lazarus Salines  Eyes: Negative.        Corrective lenses  Respiratory: Positive for shortness of breath. Negative for cough, hemoptysis, sputum production and wheezing.        With moderate exertion  Cardiovascular: Positive for leg swelling. Negative for chest pain and palpitations.  Gastrointestinal: Negative.        Reports daily BM  Genitourinary: Positive for urgency and frequency.       Stress incontinence  Musculoskeletal: Positive for back pain.       Chronic Back Pain/Muscle Spasms  Skin:       Tegraderm to RUE-skin tear  Neurological: Negative.  Negative for headaches.  Endo/Heme/Allergies: Negative.   Psychiatric/Behavioral: Negative.      Physical Exam  Constitutional: She is oriented to person, place, and time. She appears well-developed and well-nourished. No distress.  HENT:  Head: Normocephalic.  Right Ear: External ear normal.  Left Ear: External ear normal.  Nose: Nose normal.  Mouth/Throat: Oropharynx is clear and moist.  Eyes: Conjunctivae and EOM are normal. Pupils are equal, round, and reactive to light.  Neck: Normal range of motion. Neck supple. No JVD present. No tracheal deviation present. Thyromegaly present.  Slightly enlarged thyroid-followed by Dr. Lazarus Salines  Cardiovascular: Normal rate, regular rhythm and normal heart sounds.   Respiratory: Effort normal. No stridor. No respiratory distress. She has wheezes. She has no rales. She exhibits no tenderness.  GI: Soft. Bowel sounds are normal. She exhibits no distension and no mass. There is no tenderness.  Musculoskeletal:       Right shoulder: She exhibits deformity.       Right hand: Decreased sensation noted. Decreased sensation is present in the ulnar distribution.       Left hand: Decreased sensation noted. Decreased sensation is  present in the ulnar distribution.  Bilateral ulnar drift of MCJ  Neurological: She is alert and oriented to person, place, and time. GCS eye subscore is 4. GCS verbal subscore is 5. GCS motor subscore is 6.  Skin: Skin is warm and dry. Abrasion noted.  Tegraderm to RUE-granulated tissue noted  Psychiatric: She has a normal mood and affect. Her behavior is  normal. Judgment and thought content normal.       ASSESSMENT/ PLAN:   Adrenal insufficiency Is stable will continue prednisone 10 mg daily and will monitor status   Hypertension Is stable will continue norvasc 10 mg daily; lopressor 50 mg twice daily asa 81 mg daily and will monitor   RA (rheumatoid arthritis) Is stable will continue prednisone 10 mg daily and will monitor   PUD (peptic ulcer disease) Stable will continue pepcid 10 mg twice daily and will monitor  Depression Is stable will continue zoloft 50 mg daily and ativan 0.5 mg nightly as needed for anxiety and will monitor   Hypothyroidism Will continue synthroid 50 mcg daily   Anemia Will continue iron daily   Dyslipidemia Will continue crestor 20 mg daily

## 2013-03-06 ENCOUNTER — Other Ambulatory Visit: Payer: Self-pay

## 2013-03-06 DIAGNOSIS — R82998 Other abnormal findings in urine: Secondary | ICD-10-CM | POA: Diagnosis not present

## 2013-03-06 DIAGNOSIS — R300 Dysuria: Secondary | ICD-10-CM | POA: Diagnosis not present

## 2013-03-06 LAB — URINALYSIS, COMPLETE
Bilirubin,UR: NEGATIVE
Blood: NEGATIVE
Glucose,UR: NEGATIVE mg/dL (ref 0–75)
Ketone: NEGATIVE
Nitrite: NEGATIVE
Protein: NEGATIVE
RBC,UR: 5 /HPF (ref 0–5)
Specific Gravity: 1.013 (ref 1.003–1.030)
Squamous Epithelial: 1
WBC UR: 109 /HPF (ref 0–5)

## 2013-03-08 LAB — URINE CULTURE

## 2013-03-21 DIAGNOSIS — N390 Urinary tract infection, site not specified: Secondary | ICD-10-CM | POA: Diagnosis not present

## 2013-03-28 DIAGNOSIS — IMO0002 Reserved for concepts with insufficient information to code with codable children: Secondary | ICD-10-CM | POA: Diagnosis not present

## 2013-03-28 DIAGNOSIS — H04129 Dry eye syndrome of unspecified lacrimal gland: Secondary | ICD-10-CM | POA: Diagnosis not present

## 2013-03-28 DIAGNOSIS — Z961 Presence of intraocular lens: Secondary | ICD-10-CM | POA: Diagnosis not present

## 2013-03-30 ENCOUNTER — Encounter: Payer: Self-pay | Admitting: Internal Medicine

## 2013-03-31 DIAGNOSIS — D649 Anemia, unspecified: Secondary | ICD-10-CM | POA: Diagnosis not present

## 2013-03-31 DIAGNOSIS — E875 Hyperkalemia: Secondary | ICD-10-CM | POA: Diagnosis not present

## 2013-04-12 DIAGNOSIS — R1314 Dysphagia, pharyngoesophageal phase: Secondary | ICD-10-CM | POA: Diagnosis not present

## 2013-04-14 ENCOUNTER — Other Ambulatory Visit (HOSPITAL_COMMUNITY): Payer: Self-pay | Admitting: Gastroenterology

## 2013-04-14 DIAGNOSIS — R1310 Dysphagia, unspecified: Secondary | ICD-10-CM

## 2013-04-15 ENCOUNTER — Other Ambulatory Visit: Payer: Self-pay | Admitting: *Deleted

## 2013-04-15 DIAGNOSIS — E039 Hypothyroidism, unspecified: Secondary | ICD-10-CM | POA: Diagnosis not present

## 2013-04-15 DIAGNOSIS — D649 Anemia, unspecified: Secondary | ICD-10-CM | POA: Diagnosis not present

## 2013-04-15 MED ORDER — HYDROCODONE-ACETAMINOPHEN 5-325 MG PO TABS: ORAL_TABLET | ORAL | Status: DC

## 2013-04-18 ENCOUNTER — Ambulatory Visit (HOSPITAL_COMMUNITY)
Admission: RE | Admit: 2013-04-18 | Discharge: 2013-04-18 | Disposition: A | Discharge status: Home / Self Care | Payer: Medicare Other | Source: Ambulatory Visit | Attending: Gastroenterology | Admitting: Gastroenterology

## 2013-04-18 DIAGNOSIS — R1310 Dysphagia, unspecified: Secondary | ICD-10-CM

## 2013-04-18 NOTE — Procedures (Signed)
Objective Swallowing Evaluation: Modified Barium Swallowing Study  Patient Details  Name: [name] MRN: [id number] Date of Birth: [birth year]  Today's Date: 04/18/2013 Time: 1300-1340 SLP Time Calculation (min): 40 min  Past Medical History:  Past Medical History  Diagnosis Date  . SOB (shortness of breath)   . MI (myocardial infarction)   . Poor appetite   . Arthritis   . Fatigue   . Right ear pain   . Renal insufficiency   . RA (rheumatoid arthritis)   . PUD (peptic ulcer disease)   . GI bleed 2005  . GERD (gastroesophageal reflux disease)   . Hiatal hernia   . Hypothyroidism   . Osteoporosis   . Depression   . Anxiety   . Coronary artery disease   . CVA (cerebral vascular accident)   . TIA (transient ischemic attack)   . Fall   . DVT of lower extremity (deep venous thrombosis)    Past Surgical History:  Past Surgical History  Procedure Laterality Date  . Nstemi  06/2010  . Spinal fusion surgery    . Knee surgery    . Cardiac catheterization      SHOWED RUPTURE PLAQUE IN THE LAD. THE LAD IS NONOBSTRUCTIVE WITH ONLY 30-40% STENOSIS   HPI:  79 yo female resident of facility referred for MBS due to pharyngoesophageal phase dysphagia.  Pt has undergone esophagrams in the past 02/03/2013 findings of moderate sized hiatal hernia, moderate tertiary contractions in the mid and distal esophagus, prominent cricopharyngeus, no aspiration, posterior pharyngeal diverticulum of doubtful significance.   Esophagram 03/18/2007 large hiatal hernia, suggesting sliding and paraesophageal components, compatible with distal stricture, nonspecific esophageal motility disorder.  Pt has h/o CVA/TIA resulting in left sided weakness, GERD, GI bleed, RA, recurrent Cdiff.  Pt states her primary complaint is hoarseness and she denies significant problems swallowing. 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       Assessment / Plan / Recommendation Clinical Impression  Dysphagia Diagnosis: Mild oral phase dysphagia;Suspected primary esophageal dysphagia (minimal pharyngeal ) pt's kyphosis may contribute to her known esophageal dysphagia Clinical impression: Mild oral and minimal pharyngeal dysphagia with sensorimotor components.  Decreased oral coordination noted with pt extending head upward to aid oral transit into pharynx.   Pharyngeal deficits characterized by sensory impairments with liquids accumulating at pyriform sinus prior to swallow initiation. Pharyngeal swallow was strong with only minimal liquid residuals at vallecular space.  No aspiration or penetration or significant stasis.  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   As pt complains of xerosotmia, advised her to start meals with liquids (? warm or room temperature).  Suspect pt's primary symptoms are consistent with known esophageal deficits - Please note pt reports better tolerance of solids (less discomfort and reflux) since use of PPI BID but denies improvement in voice.    Pt would benefit from follow up SLP briefly for compensation strategy generalization and to treat oral deficits. Skilled intervention included educating pt to findings, reinforcment of compensation strategies found to be helpful.      Treatment Recommendation  Defer treatment plan to SLP at (Comment) (at pt's current primary location)    Diet Recommendation Regular;Thin liquid (consider extra gravy/sauce)   Liquid Administration via: Cup;Straw Medication Administration: Whole meds with liquid Supervision: Patient able to self feed Compensations: Slow rate;Small sips/bites (drink liquids t/o meal, intermittent dry swallow, start po with drink)  Postural Changes and/or Swallow Maneuvers: Seated  upright 90 degrees;Upright 30-60 min after meal (consider several small meals/day)    Other  Recommendations   n/a  Follow Up Recommendations   (consider follow up at pt's current residential  location)           SLP Swallow Goals  n/a   General Date of Onset: 04/18/13 HPI: 79 yo female resident of facility referred for MBS due to pharyngoesophageal phase dysphagia.  Pt has undergone esophagrams in the past 02/03/2013 findings of moderate sized hiatal hernia, moderate tertiary contractions in the mid and distal esophagus, prominent cricopharyngeus, no aspiration, posterior pharyngeal diverticulum of doubtful significance.   Esophagram 03/18/2007 large hiatal hernia, suggesting sliding and paraesophageal components, compatible with distal stricture, nonspecific esophageal motility disorder.  Pt has h/o CVA/TIA resulting in left sided weakness, GERD, GI bleed, RA, recurrent Cdiff.  Pt states her primary complaint is hoarseness and she denies significant problems swallowing. 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   Type of Study: Modified Barium Swallowing Study Reason for Referral: Objectively evaluate swallowing function Diet Prior to this Study: Regular;Thin liquids Respiratory Status: Room air Behavior/Cognition: Alert;Cooperative;Pleasant mood Oral Cavity - Dentition: Adequate natural dentition Oral Motor / Sensory Function: Within functional limits (pt leaning to left during testing) Self-Feeding Abilities: Able to feed self Patient Positioning: Postural control interferes with function (pt is kyphotic) Baseline Vocal Quality: Clear Volitional Cough: Strong Volitional Swallow: Able to elicit (does report xerostomia) Anatomy: Within functional limits Pharyngeal Secretions: Not observed secondary MBS    Reason for Referral Objectively evaluate swallowing function   Oral Phase Oral Preparation/Oral Phase Oral Phase: Impaired Oral - Nectar Oral - Nectar Cup: Delayed oral transit;Weak lingual manipulation Oral - Thin Oral - Thin Teaspoon: Delayed oral transit;Weak lingual manipulation Oral - Thin Cup: Delayed oral transit;Weak lingual  manipulation Oral - Thin Straw: Delayed oral transit;Weak lingual manipulation Oral - Solids Oral - Puree: Delayed oral transit;Weak lingual manipulation Oral - Regular: Delayed oral transit;Weak lingual manipulation Oral - Pill: Delayed oral transit;Weak lingual manipulation;Impaired mastication;Piecemeal swallowing;Holding of bolus Oral Phase - Comment Oral Phase - Comment: suspect pt compensates for her oral deficits by extending head upward, she is not aware of doing this posture   Pharyngeal Phase Pharyngeal Phase Pharyngeal Phase: Impaired Pharyngeal - Nectar Pharyngeal - Nectar Cup: Premature spillage to pyriform sinuses;Pharyngeal residue - valleculae Pharyngeal - Thin Pharyngeal - Thin Cup: Premature spillage to pyriform sinuses;Pharyngeal residue - valleculae Pharyngeal - Thin Straw: Premature spillage to pyriform sinuses;Pharyngeal residue - valleculae Pharyngeal - Solids Pharyngeal - Puree: Premature spillage to valleculae Pharyngeal - Regular: Premature spillage to valleculae Pharyngeal - Pill: Within functional limits Pharyngeal Phase - Comment Pharyngeal Comment: delayed swallow initiation up to 3 seconds with nectar to pyriform sinus, trace stasis of liquids at vallecular space cleared with dry swallowing, pharyngeal swallow strong albeit delayed  Cervical Esophageal Phase    GO    Cervical Esophageal Phase Cervical Esophageal Phase: Impaired Cervical Esophageal Phase - Comment Cervical Esophageal Comment: SLP did not observe prominent cp nor pharyngeal diverticulum in sagittal view diagnosed during Sept 2014 esophagram - radiologist not present to confirm.  Appearance of slow clearance (with puree/solid more than liquid) mostly at central esophagus and tertiary contractions with liquids at distal region without pt sensation.  Liquid and cued dry swallows appeared to aid esophageal clearance.  Barium tablet taken with thin appeared to lodged at GE junction WITHOUT pt  sensation - Further swallow of thin transited barium tablet  into paraesophageal hernia- Radiologist not present to confirm.       Functional Assessment Tool Used: mbs, clinical judgement Functional Limitations: Swallowing Swallow Current Status ([id number]): At least 20 percent but less than 40 percent impaired, limited or restricted Swallow Goal Status [phone]): At least 20 percent but less than 40 percent impaired, limited or restricted Swallow Discharge Status [id number]): At least 20 percent but less than 40 percent impaired, limited or restricted    Donavan Burnet, MS [facility] SLP [phone]

## 2013-04-20 DIAGNOSIS — E039 Hypothyroidism, unspecified: Secondary | ICD-10-CM | POA: Diagnosis not present

## 2013-04-30 DIAGNOSIS — Z23 Encounter for immunization: Secondary | ICD-10-CM | POA: Diagnosis not present

## 2013-05-04 ENCOUNTER — Other Ambulatory Visit: Payer: Self-pay | Admitting: *Deleted

## 2013-05-04 MED ORDER — HYDROCODONE-ACETAMINOPHEN 5-325 MG PO TABS: ORAL_TABLET | ORAL | Status: DC

## 2013-05-18 ENCOUNTER — Non-Acute Institutional Stay (SKILLED_NURSING_FACILITY): Payer: Medicare Other | Admitting: Nurse Practitioner

## 2013-05-18 DIAGNOSIS — J309 Allergic rhinitis, unspecified: Secondary | ICD-10-CM

## 2013-05-18 DIAGNOSIS — D649 Anemia, unspecified: Secondary | ICD-10-CM

## 2013-05-18 DIAGNOSIS — E2749 Other adrenocortical insufficiency: Secondary | ICD-10-CM | POA: Diagnosis not present

## 2013-05-18 DIAGNOSIS — M069 Rheumatoid arthritis, unspecified: Secondary | ICD-10-CM

## 2013-05-18 DIAGNOSIS — E785 Hyperlipidemia, unspecified: Secondary | ICD-10-CM | POA: Diagnosis not present

## 2013-05-18 DIAGNOSIS — E2740 Unspecified adrenocortical insufficiency: Secondary | ICD-10-CM

## 2013-05-18 DIAGNOSIS — E039 Hypothyroidism, unspecified: Secondary | ICD-10-CM

## 2013-05-20 DIAGNOSIS — E039 Hypothyroidism, unspecified: Secondary | ICD-10-CM | POA: Diagnosis not present

## 2013-05-23 DIAGNOSIS — R1314 Dysphagia, pharyngoesophageal phase: Secondary | ICD-10-CM | POA: Diagnosis not present

## 2013-06-20 ENCOUNTER — Non-Acute Institutional Stay (SKILLED_NURSING_FACILITY): Payer: Medicare Other | Admitting: Internal Medicine

## 2013-06-20 ENCOUNTER — Encounter: Payer: Self-pay | Admitting: Internal Medicine

## 2013-06-20 DIAGNOSIS — E2740 Unspecified adrenocortical insufficiency: Secondary | ICD-10-CM

## 2013-06-20 DIAGNOSIS — F32A Depression, unspecified: Secondary | ICD-10-CM

## 2013-06-20 DIAGNOSIS — I10 Essential (primary) hypertension: Secondary | ICD-10-CM | POA: Diagnosis not present

## 2013-06-20 DIAGNOSIS — K219 Gastro-esophageal reflux disease without esophagitis: Secondary | ICD-10-CM | POA: Diagnosis not present

## 2013-06-20 DIAGNOSIS — R0609 Other forms of dyspnea: Secondary | ICD-10-CM

## 2013-06-20 DIAGNOSIS — E039 Hypothyroidism, unspecified: Secondary | ICD-10-CM

## 2013-06-20 DIAGNOSIS — E785 Hyperlipidemia, unspecified: Secondary | ICD-10-CM

## 2013-06-20 DIAGNOSIS — D649 Anemia, unspecified: Secondary | ICD-10-CM

## 2013-06-20 DIAGNOSIS — E2749 Other adrenocortical insufficiency: Secondary | ICD-10-CM

## 2013-06-20 DIAGNOSIS — F329 Major depressive disorder, single episode, unspecified: Secondary | ICD-10-CM

## 2013-06-20 DIAGNOSIS — M069 Rheumatoid arthritis, unspecified: Secondary | ICD-10-CM

## 2013-06-20 NOTE — Progress Notes (Signed)
Patient ID: [name], female   DOB: 03-16-1930, 79 y.o.   MRN: [id number]    [name]  Chief Complaint  Patient presents with  . Medical Managment of Chronic Issues  . Allergies  Allergen Reactions  . Codeine     sick  . Morphine And Related Other (See Comments)    unknown  . Percocet [Oxycodone-Acetaminophen]     unknown  . Plavix [Clopidogrel Bisulfate] Other (See Comments)    unknown  . Sulfur Other (See Comments)    unknown   HPI 79 y/o female patient seen today for routine follow up visit. She complaints of dyspnea on exertion. She has runny nose with clear drainage. No cough or sore throat. Remains afebrile. No other concerns.   Review of Systems  Constitutional: Negative.   HENT: Negative for earache, sore throat, tinnitus.    Eyes: Negative.         Corrective lenses  Respiratory: Positive for shortness of breath with exertion. Negative for cough, hemoptysis, sputum production and wheezing.   Cardiovascular: Positive for leg swelling. Negative for chest pain and palpitations.  Gastrointestinal: Negative for abdominal pain, nausea, vomiting, constipation  Genitourinary: Positive for urgency and frequency.        Stress incontinence  Musculoskeletal: Positive for back pain.       Chronic Back Pain/Muscle Spasms  Skin: no sores, ulcers, rash  Neurological:  Negative for headaches. Psychiatric/Behavioral: Negative for depression, memory loss  Past Medical History  Diagnosis Date  . SOB (shortness of breath)   . MI (myocardial infarction)   . Poor appetite   . Arthritis   . Fatigue   . Right ear pain   . Renal insufficiency   . RA (rheumatoid arthritis)   . PUD (peptic ulcer disease)   . GI bleed 2005  . GERD (gastroesophageal reflux disease)   . Hiatal hernia   . Hypothyroidism   . Osteoporosis   . Depression   . Anxiety   . Coronary artery disease   . CVA (cerebral vascular accident)   . TIA (transient ischemic attack)   . Fall   .  DVT of lower extremity (deep venous thrombosis)    Medication reviewed. See [facility]  Physical exam BP 135/68  Pulse 85  Temp(Src) 98.4 F (36.9 C)  Resp 18  SpO2 96%  General- elderly female in no acute distress Head- atraumatic, normocephalic Eyes- PERRLA, EOMI, no pallor, no icterus, no discharge Neck- has thyromegaly and follows with Dr [name], no lymphadenopathy, no jugular vein distension, no carotid bruit Ears- left ear normal tympanic membrane and normal external ear canal , right ear normal tympanic membrane and normal external ear canal Chest- no chest wall deformities, no chest wall tenderness Cardiovascular- normal s1,s2, no murmurs/ rubs/ gallops Respiratory- bilateral clear to auscultation, no wheeze, no rhonchi, no crackles Abdomen- bowel sounds present, soft, non tender Musculoskeletal- able to move all 4 extremities, has arthritis changes in her hand Neurological- no focal deficit Psychiatry- alert and oriented to person, place and time, normal mood and affect  Labs- 12-20-12: tsh 0.374 01-25-13: wbc 12.1; hgb 8.3; hct 26.0; mcv 90.0; plt 194; glucose 98; bun 19; creat 1.18; k+4.2;na++141 04-15-13 wbc 10.3, hb 11.8, plt 177 05-20-13 tsh 0.580   ASSESSMENT/ PLAN:  Hypertension stable will continue norvasc 10 mg daily, lopressor 50 mg twice daily. Also to continue asa 81 mg daily and lipitor 20 mg daily   Dyspnea on exertion Her progressive RA could be contributing to  this with possible interstitial fibrosis changes. No chest pain making pleuritis less likely. No signs of fluid overload. Normal air entry on lung exam making pneumonia unlikely. Is afebrile. Can monitor wbc and temp curve. Will get chest xray to assess for these changes. Is on chronic low dose prednisone. Can give a burst of prednsione if symptom worsens. Continue atrovent nasal spray and will have her on albuterol MDI prn to help with dyspnea  Adrenal insufficiency Is stable will continue prednisone  10 mg daily and will monitor status   GERD Continue nexium daily with pepcid  Anemia Stable h/h. Will continue iron daily with folic acid  RA (rheumatoid arthritis) Is stable will continue prednisone 10 mg daily, folic acid supplement and norco at bedtime for pain and will monitor   Depression continue zoloft 50 mg daily and ativan 0.5 mg nightly as needed for anxiety and will monitor   Hypothyroidism Will continue synthroid 50 mcg daily, reviewed recent tsh  Dyslipidemia Will continue crestor 20 mg daily and check flp  Spent more than 50 minutes

## 2013-06-21 DIAGNOSIS — R0602 Shortness of breath: Secondary | ICD-10-CM | POA: Diagnosis not present

## 2013-06-21 DIAGNOSIS — E785 Hyperlipidemia, unspecified: Secondary | ICD-10-CM | POA: Diagnosis not present

## 2013-06-27 DIAGNOSIS — N390 Urinary tract infection, site not specified: Secondary | ICD-10-CM | POA: Diagnosis not present

## 2013-07-05 ENCOUNTER — Non-Acute Institutional Stay (SKILLED_NURSING_FACILITY): Payer: Medicare Other | Admitting: Adult Health

## 2013-07-05 ENCOUNTER — Encounter: Payer: Self-pay | Admitting: Adult Health

## 2013-07-05 DIAGNOSIS — J309 Allergic rhinitis, unspecified: Secondary | ICD-10-CM | POA: Insufficient documentation

## 2013-07-05 DIAGNOSIS — E785 Hyperlipidemia, unspecified: Secondary | ICD-10-CM | POA: Diagnosis not present

## 2013-07-05 MED ORDER — FEXOFENADINE HCL 180 MG PO TABS: 180.0000 mg | ORAL_TABLET | Freq: Every day | ORAL | Status: DC

## 2013-07-05 MED ORDER — ROSUVASTATIN CALCIUM 20 MG PO TABS: 20.0000 mg | ORAL_TABLET | Freq: Every evening | ORAL | Status: DC

## 2013-07-05 NOTE — Progress Notes (Signed)
Patient ID: [name], female   DOB: [birth year], 80 y.o.   MRN: [id number]     [facility]  Allergies  Allergen Reactions  . Codeine     sick  . Morphine And Related Other (See Comments)    unknown  . Percocet [Oxycodone-Acetaminophen]     unknown  . Plavix [Clopidogrel Bisulfate] Other (See Comments)    unknown  . Sulfur Other (See Comments)    unknown     Chief Complaint  Patient presents with  . Acute Visit    patient concerns    HPI:  She has had sinus congestion present; she was started on claritin; however; she states that this medication is not working and is just making her nose feel dried out. she took Human resources officer at home and this medication worked well for her. She would like to restart this medication We discussed her chol panel. We will restart the crestor at 20 mg daily.   Past Medical History  Diagnosis Date  . SOB (shortness of breath)   . MI (myocardial infarction)   . Poor appetite   . Arthritis   . Fatigue   . Right ear pain   . Renal insufficiency   . RA (rheumatoid arthritis)   . PUD (peptic ulcer disease)   . GI bleed 2005  . GERD (gastroesophageal reflux disease)   . Hiatal hernia   . Hypothyroidism   . Osteoporosis   . Depression   . Anxiety   . Coronary artery disease   . CVA (cerebral vascular accident)   . TIA (transient ischemic attack)   . Fall   . DVT of lower extremity (deep venous thrombosis)     Past Surgical History  Procedure Laterality Date  . Nstemi  06/2010  . Spinal fusion surgery    . Knee surgery    . Cardiac catheterization      SHOWED RUPTURE PLAQUE IN THE LAD. THE LAD IS NONOBSTRUCTIVE WITH ONLY 30-40% STENOSIS    VITAL SIGNS BP 132/68  Pulse 80  Ht 5\' 6"  (1.676 m)  Wt 151 lb 12.8 oz (68.856 kg)  BMI 24.51 kg/m2   Patient's Medications  New Prescriptions   No medications on file  Previous Medications   ALBUTEROL (PROVENTIL HFA;VENTOLIN HFA) 108 (90 BASE) MCG/ACT INHALER    Inhale 1 puff into the  lungs every 6 (six) hours as needed for wheezing or shortness of breath.   AMLODIPINE (NORVASC) 10 MG TABLET    Take 1 tablet (10 mg total) by mouth daily.   ASPIRIN 81 MG CHEWABLE TABLET    Chew 81 mg by mouth daily.   ESOMEPRAZOLE (NEXIUM) 20 MG CAPSULE    Take 20 mg by mouth 2 (two) times daily before a meal.    FERROUS SULFATE 325 (65 FE) MG TABLET    Take 325 mg by mouth daily with breakfast.   FOLIC ACID (FOLVITE) 1 MG TABLET    Take 1 mg by mouth daily.   IPRATROPIUM (ATROVENT) 0.03 % NASAL SPRAY    Place 2 sprays into both nostrils every 12 (twelve) hours.   LEVOTHYROXINE (SYNTHROID, LEVOTHROID) 50 MCG TABLET    Take 50 mcg by mouth daily.    LORATADINE (CLARITIN) 10 MG TABLET    Take 10 mg by mouth daily.   LORAZEPAM (ATIVAN) 0.5 MG TABLET    Take one tablet by mouth every night at bedtime for anxiety   METOPROLOL (LOPRESSOR) 50 MG TABLET    Take 50 mg  by mouth 2 (two) times daily.    MULTIPLE VITAMIN (MULTIVITAMIN) TABLET    Take 1 tablet by mouth daily.     OMEGA-3 FATTY ACIDS (FISH OIL) 1200 MG CAPS    Take 1,200 mg by mouth daily.   POLYETHYL GLYCOL-PROPYL GLYCOL (SYSTANE) 0.4-0.3 % SOLN    Apply 1 drop to eye 2 (two) times daily.   PREDNISONE (DELTASONE) 10 MG TABLET    Take 10 mg by mouth daily.   ROSUVASTATIN (CRESTOR) 20 MG TABLET    Take 10 mg by mouth every evening.    SERTRALINE (ZOLOFT) 50 MG TABLET    Take 50 mg by mouth every morning.   VITAMIN C (ASCORBIC ACID) 500 MG TABLET    Take 500 mg by mouth daily.  Modified Medications   No medications on file  Discontinued Medications   BENZONATATE (TESSALON) 100 MG CAPSULE    Take 1 capsule (100 mg total) by mouth 3 (three) times daily as needed for cough.   FAMOTIDINE (PEPCID) 10 MG TABLET    Take 1 tablet (10 mg total) by mouth 2 (two) times daily.   FOLIC ACID-VIT C5-YIF O27 (FOLBEE) 2.5-25-1 MG TABS    Take 1 tablet by mouth daily.   HYDROCODONE-ACETAMINOPHEN (NORCO/VICODIN) 5-325 MG PER TABLET    Take 1/2 tabelt by mouth  every night at bedtime for pain   PROMETHAZINE (PHENERGAN) 25 MG TABLET    Take 25 mg by mouth every 6 (six) hours as needed. nausea   SACCHAROMYCES BOULARDII (FLORASTOR) 250 MG CAPSULE    Take 1 capsule (250 mg total) by mouth 2 (two) times daily.    SIGNIFICANT DIAGNOSTIC EXAMS  06-10-13: chest x-ray: no acute cardiopulmonary disease   LABS REVIEWED:   01-10-13; glucose 82; bun 33; creat 1.3; k+4.3; na++141  01-26-13: wbc 9.6; hgb 9.0; hct 28.8 ;mcv 90.9; plt 262; chol 97; ldl 28; trig 178; tsh 1.089  03-31-13: wbc 18.3; hgb 12.1; hct 39.0; mcv 91.3 plt 182; glucose 90; bun 28; creat 1.1;k+ 3.5;na++ 139 04-15-13: wbc 10.3; hgb 11.8; hct 37.9; mcv 90.7. plt 177 tsh 0.369 04-20-13: tsh 0.314 05-20-13: tsh 0.580 06-21-13: chol 169; ldl 87; trig 409 06-27-13: urine culture: p mirabilis: rocephin     Review of Systems  Constitutional: Negative for malaise/fatigue.  Respiratory: Positive for shortness of breath. Negative for cough.   Cardiovascular: Negative for chest pain and palpitations.  Gastrointestinal: Negative for heartburn and constipation.  Genitourinary: Negative for dysuria.  Musculoskeletal: Negative for joint pain and myalgias.  Skin:       Has resolving blister on right great toe  Neurological: Negative for weakness and headaches.    Physical Exam  Constitutional: She is oriented to person, place, and time. She appears well-developed and well-nourished. No distress.  Neck: Neck supple. No JVD present.  Cardiovascular: Normal rate, regular rhythm and intact distal pulses.   Respiratory: Effort normal and breath sounds normal. No respiratory distress. She has no wheezes.  GI: Soft. Bowel sounds are normal. She exhibits no distension. There is no tenderness.  Musculoskeletal: Normal range of motion. She exhibits no edema.  Neurological: She is alert and oriented to person, place, and time.  Skin: Skin is warm and dry. She is not diaphoretic.  Right great toe has  resolving blister present no signs of infection present   Psychiatric: She has a normal mood and affect.     ASSESSMENT/ PLAN:  1. Dyslipidemia: will increase crestor to 20 mg daily and will check  lipids and liver function in 3 months will monitor her status  2. Allergic rhinitis: is worse: will continue atrovent nasal spray; and her albuterol inhaler as needed for shortness of breath; will stop the claritin as it is ineffective and will start allegra 180 mg daily. Will stop her afrin nasal spray.

## 2013-07-07 DIAGNOSIS — E039 Hypothyroidism, unspecified: Secondary | ICD-10-CM | POA: Diagnosis not present

## 2013-07-10 NOTE — Progress Notes (Signed)
Date of visit is 05/18/2013 [name]  Patient ID: [name], female   DOB: [birth year], 80 y.o.   MRN: [id number]  Allergies  Allergen Reactions  . Codeine     sick  . Morphine And Related Other (See Comments)    unknown  . Percocet [Oxycodone-Acetaminophen]     unknown  . Plavix [Clopidogrel Bisulfate] Other (See Comments)    unknown  . Sulfur Other (See Comments)    unknown    Chief Complaint  Patient presents with  . Acute Visit    HPI:  Pt with c/o nasal congestion, cold, mildly sore throat.  Pt believes she may need an antibiotic.  She denies any fever, chills, purulent sputum, or other indicators of acute infection.    Significant for history of Adrenal insufficiency, leucocytosis, Verbalizes no other c/o's.  Past Medical History  Diagnosis Date  . SOB (shortness of breath)   . MI (myocardial infarction)   . Poor appetite   . Arthritis   . Fatigue   . Right ear pain   . Renal insufficiency   . RA (rheumatoid arthritis)   . PUD (peptic ulcer disease)   . GI bleed 2005  . GERD (gastroesophageal reflux disease)   . Hiatal hernia   . Hypothyroidism   . Osteoporosis   . Depression   . Anxiety   . Coronary artery disease   . CVA (cerebral vascular accident)   . TIA (transient ischemic attack)   . Fall   . DVT of lower extremity (deep venous thrombosis)    Past Surgical History  Procedure Laterality Date  . Nstemi  06/2010  . Spinal fusion surgery    . Knee surgery    . Cardiac catheterization      SHOWED RUPTURE PLAQUE IN THE LAD. THE LAD IS NONOBSTRUCTIVE WITH ONLY 30-40% STENOSIS    Medications  New Prescriptions    No medications on file  Previous Medications    ALBUTEROL (PROVENTIL HFA;VENTOLIN HFA) 108 (90 BASE) MCG/ACT INHALER     Inhale 1 puff into the lungs every 6 (six) hours as needed for wheezing or shortness of breath.    AMLODIPINE (NORVASC) 10 MG TABLET     Take 1 tablet (10 mg total) by mouth daily.    ASPIRIN 81 MG  CHEWABLE TABLET     Chew 81 mg by mouth daily.    ESOMEPRAZOLE (NEXIUM) 20 MG CAPSULE     Take 20 mg by mouth 2 (two) times daily before a meal.     FERROUS SULFATE 325 (65 FE) MG TABLET     Take 325 mg by mouth daily with breakfast.    FOLIC ACID (FOLVITE) 1 MG TABLET     Take 1 mg by mouth daily.    IPRATROPIUM (ATROVENT) 0.03 % NASAL SPRAY     Place 2 sprays into both nostrils every 12 (twelve) hours.    LEVOTHYROXINE (SYNTHROID, LEVOTHROID) 50 MCG TABLET     Take 50 mcg by mouth daily.     LORATADINE (CLARITIN) 10 MG TABLET     Take 10 mg by mouth daily.    LORAZEPAM (ATIVAN) 0.5 MG TABLET     Take one tablet by mouth every night at bedtime for anxiety    METOPROLOL (LOPRESSOR) 50 MG TABLET     Take 50 mg by mouth 2 (two) times daily.     MULTIPLE VITAMIN (MULTIVITAMIN) TABLET     Take 1 tablet by mouth daily.  OMEGA-3 FATTY ACIDS (FISH OIL) 1200 MG CAPS     Take 1,200 mg by mouth daily.    POLYETHYL GLYCOL-PROPYL GLYCOL (SYSTANE) 0.4-0.3 % SOLN     Apply 1 drop to eye 2 (two) times daily.    PREDNISONE (DELTASONE) 10 MG TABLET     Take 10 mg by mouth daily.    ROSUVASTATIN (CRESTOR) 20 MG TABLET     Take 10 mg by mouth every evening.     SERTRALINE (ZOLOFT) 50 MG TABLET     Take 50 mg by mouth every morning.    VITAMIN C (ASCORBIC ACID) 500 MG TABLET    Lab Results 03/31/2013 WBC 18.3 RBC 4.3 HGB 12.1 HCT 39 MCV 91.3 MCH 28.3 MCHC 31 RDW 14.9 PLT 182  NA 139 K 3.5 CL 103 CO2 25 AGAP 10 GLUC 90 BUN 28 Cr 1.1 CA 9.2   Vital Signs: BP 123/80, Pulse 78, RR 18, Temp 97.8, Pulse Ox 96%  Alert, verbally appropriate, in NAD, neat and well groomed, oriented x 3 Head is normo cephalic PERRLA, EOMI, positive red reflex.  Conjunctiva is pink TM's unremarkable  Nasal mucousa is boggy, no purulent drainage emerging from posterior nares Oral pharynx with no significant erythema.  Teeth in good repair, no evidence of any oral lesions No  palpable cervical adenopathy, or thyromegaly.  Carotid bruits are not evident Apical pulse RRR,  BBrS entirely clear Abdomen soft, undistended, non-tender to palpation BLE's Palpable PT pulses bilaterally.  RLE, with 1-2+ pitting edema, at medial ankle ending well below the knee, LLE, 1+ pitting edema at medial ankle, ending well below the knee.  Assessment/Plan:  Nasal Rhinitis, viral vs allergy.  Antibiotics not indicated at this time, simply no evidence of any bacterial infection.  Will implement atrovent nasal spray at 0.03%, ii puffs per nare twice a day.   Will continue to monitor very closely.  If any changes, will re-consider use of antibiotics if indicated.  Pt so instructed and verbalizes understanding.   Will also continue laratidine at 10 mg per day  Leucocytosis, will continue to monitor pt's blood counts and for any signs of infection.    Hypothyroidism, continue synthroid at 50 mcg per day, pt is requesting brand name synthroid, will write and continue to monitor TSH  Hypertension, continue Norvasc at 10 mg per day, and Lopressor 50 mg twice a day, will continue to monitor  CAD 81 mg per day and Crestor 20 mg per day will continue to monitor  Rheumatoid arthritis, continue Deltasone 10 mg per day, and continue to monitor  History of GI bleed, continue nexium at 20 mg per day, will continue to monitor  Supplementation, continue the FeSO4, 841 mg each day, Folic Acid 1 mg each day, multi-vitamin, one each day, and Fish Oil 1200 mg each day.    Asthma/shortness of breath, Proventil HFA inhaler, 2 puffs inhaled every 6 hours as needed for shortness of breath  Ativan 0.5 mg one tablet at bedtime for anxiety, will continue to monitor  Zoloft, for depression, 50 mg per day, will continue to monitor

## 2013-07-13 DIAGNOSIS — R197 Diarrhea, unspecified: Secondary | ICD-10-CM | POA: Diagnosis not present

## 2013-07-20 ENCOUNTER — Non-Acute Institutional Stay (SKILLED_NURSING_FACILITY): Payer: Medicare Other | Admitting: Adult Health

## 2013-07-20 DIAGNOSIS — K047 Periapical abscess without sinus: Secondary | ICD-10-CM | POA: Diagnosis not present

## 2013-07-25 ENCOUNTER — Encounter: Payer: Self-pay | Admitting: Adult Health

## 2013-07-25 NOTE — Progress Notes (Signed)
Patient ID: [name], female   DOB: 1930-01-01, 80 y.o.   MRN: [id number]     [facility]  Allergies  Allergen Reactions  . Codeine     sick  . Morphine And Related Other (See Comments)    unknown  . Percocet [Oxycodone-Acetaminophen]     unknown  . Plavix [Clopidogrel Bisulfate] Other (See Comments)    unknown  . Sulfur Other (See Comments)    unknown     Chief Complaint  Patient presents with  . Acute Visit    tooth ache     HPI:  She is complaining of a right upper toothache. She states her tooth has hurt for the past couple of days. She is able to eat and drink without hot or cold sensitivity. There are no reports of bleeding in gums present.   Past Medical History  Diagnosis Date  . SOB (shortness of breath)   . MI (myocardial infarction)   . Poor appetite   . Arthritis   . Fatigue   . Right ear pain   . Renal insufficiency   . RA (rheumatoid arthritis)   . PUD (peptic ulcer disease)   . GI bleed 2005  . GERD (gastroesophageal reflux disease)   . Hiatal hernia   . Hypothyroidism   . Osteoporosis   . Depression   . Anxiety   . Coronary artery disease   . CVA (cerebral vascular accident)   . TIA (transient ischemic attack)   . Fall   . DVT of lower extremity (deep venous thrombosis)     Past Surgical History  Procedure Laterality Date  . Nstemi  06/2010  . Spinal fusion surgery    . Knee surgery    . Cardiac catheterization      SHOWED RUPTURE PLAQUE IN THE LAD. THE LAD IS NONOBSTRUCTIVE WITH ONLY 30-40% STENOSIS    VITAL SIGNS BP 130/78  Pulse 68  Ht 5\' 6"  (1.676 m)  Wt 157 lb 12.8 oz (71.578 kg)  BMI 25.48 kg/m2   Patient's Medications  New Prescriptions   No medications on file  Previous Medications   ALBUTEROL (PROVENTIL HFA;VENTOLIN HFA) 108 (90 BASE) MCG/ACT INHALER    Inhale 1 puff into the lungs every 6 (six) hours as needed for wheezing or shortness of breath.   AMLODIPINE (NORVASC) 10 MG TABLET    Take 1 tablet (10 mg  total) by mouth daily.   ASPIRIN 81 MG CHEWABLE TABLET    Chew 81 mg by mouth daily.   ESOMEPRAZOLE (NEXIUM) 20 MG CAPSULE    Take 20 mg by mouth 2 (two) times daily before a meal.    FERROUS SULFATE 325 (65 FE) MG TABLET    Take 325 mg by mouth daily with breakfast.   FEXOFENADINE (ALLEGRA) 180 MG TABLET    Take 1 tablet (180 mg total) by mouth daily.   FOLIC ACID (FOLVITE) 1 MG TABLET    Take 1 mg by mouth daily.   IPRATROPIUM (ATROVENT) 0.03 % NASAL SPRAY    Place 2 sprays into both nostrils every 12 (twelve) hours.   LEVOTHYROXINE (SYNTHROID, LEVOTHROID) 50 MCG TABLET    Take 50 mcg by mouth daily.    LORAZEPAM (ATIVAN) 0.5 MG TABLET    Take one tablet by mouth every night at bedtime for anxiety   METOPROLOL (LOPRESSOR) 50 MG TABLET    Take 50 mg by mouth 2 (two) times daily.    MULTIPLE VITAMIN (MULTIVITAMIN) TABLET    Take  1 tablet by mouth daily.     OMEGA-3 FATTY ACIDS (FISH OIL) 1200 MG CAPS    Take 1,200 mg by mouth daily.   POLYETHYL GLYCOL-PROPYL GLYCOL (SYSTANE) 0.4-0.3 % SOLN    Apply 1 drop to eye 2 (two) times daily.   PREDNISONE (DELTASONE) 10 MG TABLET    Take 10 mg by mouth daily.   ROSUVASTATIN (CRESTOR) 20 MG TABLET    Take 1 tablet (20 mg total) by mouth every evening.   SERTRALINE (ZOLOFT) 50 MG TABLET    Take 50 mg by mouth every morning.   VITAMIN C (ASCORBIC ACID) 500 MG TABLET    Take 500 mg by mouth daily.  Modified Medications   No medications on file  Discontinued Medications   No medications on file    SIGNIFICANT DIAGNOSTIC EXAMS  06-10-13: chest x-ray: no acute cardiopulmonary disease   LABS REVIEWED:   01-10-13; glucose 82; bun 33; creat 1.3; k+4.3; na++141  01-26-13: wbc 9.6; hgb 9.0; hct 28.8 ;mcv 90.9; plt 262; chol 97; ldl 28; trig 178; tsh 1.089  03-31-13: wbc 18.3; hgb 12.1; hct 39.0; mcv 91.3 plt 182; glucose 90; bun 28; creat 1.1;k+ 3.5;na++ 139 04-15-13: wbc 10.3; hgb 11.8; hct 37.9; mcv 90.7. plt 177 tsh 0.369 04-20-13: tsh 0.314 05-20-13:  tsh 0.580 06-21-13: chol 169; ldl 87; trig 409 06-27-13: urine culture: p mirabilis: rocephin  07-06-13 tsh 0.83 07-13-13: stool for c-diff: neg     Review of Systems  Constitutional: Negative for malaise/fatigue. has right upper tooth ache  Respiratory: negative for shortness of breath . Negative for cough.   Cardiovascular: Negative for chest pain and palpitations.  Gastrointestinal: Negative for heartburn and constipation.  Genitourinary: Negative for dysuria.  Musculoskeletal: Negative for joint pain and myalgias.  Skin: no complaints  Neurological: Negative for weakness and headaches.    Physical Exam  Constitutional: She is oriented to person, place, and time. She appears well-developed and well-nourished. No distress. right upper incisor gum is red and inflamed; with mild swelling present.  Neck: Neck supple. No JVD present.  Cardiovascular: Normal rate, regular rhythm and intact distal pulses.   Respiratory: Effort normal and breath sounds normal. No respiratory distress. She has no wheezes.  GI: Soft. Bowel sounds are normal. She exhibits no distension. There is no tenderness.  Musculoskeletal: Normal range of motion. She exhibits no edema.  Neurological: She is alert and oriented to person, place, and time.  Skin: Skin is warm and dry. She is not diaphoretic.  Psychiatric: She has a normal mood and affect.     ASSESSMENT/ PLAN:  1. Right tooth abscess : will begin amoxicillin 500 mg three times daily for one week with florastor twice daily for one week. Will setup for a dental consult asap will monitor her status.

## 2013-07-29 ENCOUNTER — Encounter: Payer: Self-pay | Admitting: Adult Health

## 2013-07-29 ENCOUNTER — Non-Acute Institutional Stay (SKILLED_NURSING_FACILITY): Payer: Medicare Other | Admitting: Adult Health

## 2013-07-29 DIAGNOSIS — I6350 Cerebral infarction due to unspecified occlusion or stenosis of unspecified cerebral artery: Secondary | ICD-10-CM | POA: Diagnosis not present

## 2013-07-29 DIAGNOSIS — K219 Gastro-esophageal reflux disease without esophagitis: Secondary | ICD-10-CM

## 2013-07-29 DIAGNOSIS — D649 Anemia, unspecified: Secondary | ICD-10-CM

## 2013-07-29 DIAGNOSIS — E2749 Other adrenocortical insufficiency: Secondary | ICD-10-CM

## 2013-07-29 DIAGNOSIS — M069 Rheumatoid arthritis, unspecified: Secondary | ICD-10-CM

## 2013-07-29 DIAGNOSIS — E2740 Unspecified adrenocortical insufficiency: Secondary | ICD-10-CM

## 2013-07-29 DIAGNOSIS — E785 Hyperlipidemia, unspecified: Secondary | ICD-10-CM

## 2013-07-29 DIAGNOSIS — E039 Hypothyroidism, unspecified: Secondary | ICD-10-CM

## 2013-07-29 DIAGNOSIS — F411 Generalized anxiety disorder: Secondary | ICD-10-CM

## 2013-07-29 DIAGNOSIS — F32A Depression, unspecified: Secondary | ICD-10-CM

## 2013-07-29 DIAGNOSIS — F329 Major depressive disorder, single episode, unspecified: Secondary | ICD-10-CM

## 2013-07-29 DIAGNOSIS — I10 Essential (primary) hypertension: Secondary | ICD-10-CM

## 2013-07-29 DIAGNOSIS — F419 Anxiety disorder, unspecified: Secondary | ICD-10-CM

## 2013-07-29 DIAGNOSIS — I639 Cerebral infarction, unspecified: Secondary | ICD-10-CM

## 2013-07-29 DIAGNOSIS — F3289 Other specified depressive episodes: Secondary | ICD-10-CM

## 2013-07-29 NOTE — Progress Notes (Signed)
Patient ID: [name], female   DOB: 08/23/1929, 80 y.o.   MRN: [id number]    [facility]  Allergies  Allergen Reactions  . Codeine     sick  . Morphine And Related Other (See Comments)    unknown  . Percocet [Oxycodone-Acetaminophen]     unknown  . Plavix [Clopidogrel Bisulfate] Other (See Comments)    unknown  . Sulfur Other (See Comments)    unknown     Chief Complaint  Patient presents with  . Medical Managment of Chronic Issues    HPI:  She is being seen for medical management of her chronic illnesses. She is presently on abt on a right tooth abscess; which has resolved. She has seen her dentist and is due for further dental work within the next several weeks. She is not voicing any complaints is able to eat and drink without difficulty. The nursing staff is not voicing concerns.   Past Medical History  Diagnosis Date  . SOB (shortness of breath)   . MI (myocardial infarction)   . Poor appetite   . Arthritis   . Fatigue   . Right ear pain   . Renal insufficiency   . RA (rheumatoid arthritis)   . PUD (peptic ulcer disease)   . GI bleed 2005  . GERD (gastroesophageal reflux disease)   . Hiatal hernia   . Hypothyroidism   . Osteoporosis   . Depression   . Anxiety   . Coronary artery disease   . CVA (cerebral vascular accident)   . TIA (transient ischemic attack)   . Fall   . DVT of lower extremity (deep venous thrombosis)   . HCAP (healthcare-associated pneumonia) 01/22/2013  . Enteritis due to Clostridium difficile 01/04/2013  . Sepsis 12/19/2012    Past Surgical History  Procedure Laterality Date  . Nstemi  06/2010  . Spinal fusion surgery    . Knee surgery    . Cardiac catheterization      SHOWED RUPTURE PLAQUE IN THE LAD. THE LAD IS NONOBSTRUCTIVE WITH ONLY 30-40% STENOSIS    VITAL SIGNS BP 128/78  Pulse 60  Ht 5\' 6"  (1.676 m)  Wt 157 lb 12.8 oz (71.578 kg)  BMI 25.48 kg/m2   Patient's Medications  New Prescriptions   No medications on  file  Previous Medications   ALBUTEROL (PROVENTIL HFA;VENTOLIN HFA) 108 (90 BASE) MCG/ACT INHALER    Inhale 1 puff into the lungs every 6 (six) hours as needed for wheezing or shortness of breath.   AMLODIPINE (NORVASC) 10 MG TABLET    Take 1 tablet (10 mg total) by mouth daily.   ASPIRIN 81 MG CHEWABLE TABLET    Chew 81 mg by mouth daily.   ESOMEPRAZOLE (NEXIUM) 20 MG CAPSULE    Take 20 mg by mouth 2 (two) times daily before a meal.    FERROUS SULFATE 325 (65 FE) MG TABLET    Take 325 mg by mouth daily with breakfast.   FEXOFENADINE (ALLEGRA) 180 MG TABLET    Take 1 tablet (180 mg total) by mouth daily.   FOLIC ACID (FOLVITE) 1 MG TABLET    Take 1 mg by mouth daily.   IPRATROPIUM (ATROVENT) 0.03 % NASAL SPRAY    Place 2 sprays into both nostrils every 12 (twelve) hours.   LEVOTHYROXINE (SYNTHROID, LEVOTHROID) 50 MCG TABLET    Take 50 mcg by mouth daily.    LORAZEPAM (ATIVAN) 0.5 MG TABLET    Take one tablet by mouth every  night at bedtime for anxiety   METOPROLOL (LOPRESSOR) 50 MG TABLET    Take 50 mg by mouth 2 (two) times daily.    MULTIPLE VITAMIN (MULTIVITAMIN) TABLET    Take 1 tablet by mouth daily.     OMEGA-3 FATTY ACIDS (FISH OIL) 1200 MG CAPS    Take 1,200 mg by mouth daily.   POLYETHYL GLYCOL-PROPYL GLYCOL (SYSTANE) 0.4-0.3 % SOLN    Apply 1 drop to eye 2 (two) times daily.   PREDNISONE (DELTASONE) 10 MG TABLET    Take 10 mg by mouth daily.   crestor 20 mg  Take daily    SERTRALINE (ZOLOFT) 50 MG TABLET    Take 50 mg by mouth every morning.   VITAMIN C (ASCORBIC ACID) 500 MG TABLET    Take 500 mg by mouth daily.  Modified Medications   Modified Medication Previous Medication  Discontinued Medications   No medications on file    SIGNIFICANT DIAGNOSTIC EXAMS  06-10-13: chest x-ray: no acute cardiopulmonary disease   LABS REVIEWED:   01-10-13; glucose 82; bun 33; creat 1.3; k+4.3; na++141  01-26-13: wbc 9.6; hgb 9.0; hct 28.8 ;mcv 90.9; plt 262; chol 97; ldl 28; trig 178; tsh  1.089  03-31-13: wbc 18.3; hgb 12.1; hct 39.0; mcv 91.3 plt 182; glucose 90; bun 28; creat 1.1;k+ 3.5;na++ 139 04-15-13: wbc 10.3; hgb 11.8; hct 37.9; mcv 90.7. plt 177 tsh 0.369 04-20-13: tsh 0.314 05-20-13: tsh 0.580 06-21-13: chol 169; ldl 87; trig 409 06-27-13: urine culture: p mirabilis: rocephin  07-06-13 tsh 0.83 07-13-13: stool for c-diff: neg     Review of Systems  Constitutional: Negative for malaise/fatigue.  Respiratory: negative for shortness of breath . Negative for cough.   Cardiovascular: Negative for chest pain and palpitations.  Gastrointestinal: Negative for heartburn and constipation.  Genitourinary: Negative for dysuria.  Musculoskeletal: Negative for joint pain and myalgias.  Skin: no complaints  Neurological: Negative for weakness and headaches.    Physical Exam  Constitutional: She is oriented to person, place, and time. She appears well-developed and well-nourished. No distress.  Neck: Neck supple. No JVD present.  Cardiovascular: Normal rate, regular rhythm and intact distal pulses.   Respiratory: Effort normal and breath sounds normal. No respiratory distress. She has no wheezes.  GI: Soft. Bowel sounds are normal. She exhibits no distension. There is no tenderness.  Musculoskeletal: Normal range of motion. She exhibits no edema.  Neurological: She is alert and oriented to person, place, and time.  Skin: Skin is warm and dry. She is not diaphoretic.  Psychiatric: She has a normal mood and affect.     ASSESSMENT/ PLAN:  1. Dyslipidemia: will continue her crestor 20 mg daily and fish oil 1200 mg daily   2. Hypertension: will continue lopressor 50 mg twice daily  norvac 10 mg daily ans asa 81 mg daily and will monitor   3. Gerd: will continue nexium 20 mg twice daily   4. Anemia; will continue iron daily   5. Adrenal insuffiency: is stable will continue prednisone 10 mg daily and will monitor   6. Allergic rhinitis: will continue allegra 180 mg  daily and atrovent nasal spray twice daily   7. Depression and anxiety she is emotionally stable will continue zoloft 50 mg daily and ativan 0.5 mg nightly and will monitor her status   8. RA: she is stable her pain is stable prednisone 10 mg daily and will monitor her status.   9. Old cva: she is neurology intact  and stable will continue asa 81 mg daily   Will stop her vitamin c at this time.

## 2013-08-04 LAB — BASIC METABOLIC PANEL
BUN: 25 mg/dL — AB (ref 4–21)
CREATININE: 1.3 mg/dL — AB (ref 0.5–1.1)
GLUCOSE: 72 mg/dL
Potassium: 3.7 mmol/L (ref 3.4–5.3)
Sodium: 141 mmol/L (ref 137–147)

## 2013-08-04 LAB — FOLATE: Vitamin B-12: 545

## 2013-08-04 LAB — CBC AND DIFFERENTIAL
HEMATOCRIT: 33 % — AB (ref 36–46)
Hemoglobin: 11.4 g/dL — AB (ref 12.0–16.0)
PLATELETS: 140 10*3/uL — AB (ref 150–399)
WBC: 12 10^3/mL

## 2013-08-26 ENCOUNTER — Non-Acute Institutional Stay (SKILLED_NURSING_FACILITY): Payer: Medicare Other | Admitting: Adult Health

## 2013-08-26 DIAGNOSIS — F3289 Other specified depressive episodes: Secondary | ICD-10-CM

## 2013-08-26 DIAGNOSIS — E2749 Other adrenocortical insufficiency: Secondary | ICD-10-CM

## 2013-08-26 DIAGNOSIS — M069 Rheumatoid arthritis, unspecified: Secondary | ICD-10-CM

## 2013-08-26 DIAGNOSIS — I6350 Cerebral infarction due to unspecified occlusion or stenosis of unspecified cerebral artery: Secondary | ICD-10-CM | POA: Diagnosis not present

## 2013-08-26 DIAGNOSIS — I639 Cerebral infarction, unspecified: Secondary | ICD-10-CM

## 2013-08-26 DIAGNOSIS — F32A Depression, unspecified: Secondary | ICD-10-CM

## 2013-08-26 DIAGNOSIS — F329 Major depressive disorder, single episode, unspecified: Secondary | ICD-10-CM

## 2013-08-26 DIAGNOSIS — I10 Essential (primary) hypertension: Secondary | ICD-10-CM | POA: Diagnosis not present

## 2013-08-26 DIAGNOSIS — E039 Hypothyroidism, unspecified: Secondary | ICD-10-CM | POA: Diagnosis not present

## 2013-08-26 DIAGNOSIS — E785 Hyperlipidemia, unspecified: Secondary | ICD-10-CM

## 2013-08-26 DIAGNOSIS — E2740 Unspecified adrenocortical insufficiency: Secondary | ICD-10-CM

## 2013-08-26 DIAGNOSIS — J309 Allergic rhinitis, unspecified: Secondary | ICD-10-CM

## 2013-08-26 DIAGNOSIS — K219 Gastro-esophageal reflux disease without esophagitis: Secondary | ICD-10-CM

## 2013-08-26 DIAGNOSIS — D649 Anemia, unspecified: Secondary | ICD-10-CM

## 2013-09-01 ENCOUNTER — Other Ambulatory Visit: Payer: Self-pay

## 2013-09-01 ENCOUNTER — Encounter: Payer: Self-pay | Admitting: Adult Health

## 2013-09-01 DIAGNOSIS — E860 Dehydration: Secondary | ICD-10-CM | POA: Diagnosis not present

## 2013-09-01 DIAGNOSIS — D649 Anemia, unspecified: Secondary | ICD-10-CM | POA: Diagnosis not present

## 2013-09-01 DIAGNOSIS — D531 Other megaloblastic anemias, not elsewhere classified: Secondary | ICD-10-CM | POA: Diagnosis not present

## 2013-09-01 LAB — CBC WITH DIFFERENTIAL/PLATELET
Basophil #: 0.1 10*3/uL (ref 0.0–0.1)
Basophil %: 0.8 %
Eosinophil #: 0.2 10*3/uL (ref 0.0–0.7)
Eosinophil %: 1.4 %
HCT: 33.2 % — ABNORMAL LOW (ref 35.0–47.0)
HGB: 11.4 g/dL — ABNORMAL LOW (ref 12.0–16.0)
Lymphocyte #: 2.2 10*3/uL (ref 1.0–3.6)
Lymphocyte %: 18.6 %
MCH: 29.8 pg (ref 26.0–34.0)
MCHC: 34.4 g/dL (ref 32.0–36.0)
MCV: 87 fL (ref 80–100)
MONOS PCT: 9.4 %
Monocyte #: 1.1 x10 3/mm — ABNORMAL HIGH (ref 0.2–0.9)
NEUTROS ABS: 8.3 10*3/uL — AB (ref 1.4–6.5)
Neutrophil %: 69.8 %
Platelet: 150 10*3/uL (ref 150–440)
RBC: 3.83 10*6/uL (ref 3.80–5.20)
RDW: 14.5 % (ref 11.5–14.5)
WBC: 12 10*3/uL — ABNORMAL HIGH (ref 3.6–11.0)

## 2013-09-01 NOTE — Progress Notes (Signed)
Patient ID: [name], female   DOB: Nov 11, 1929, 80 y.o.   MRN: [id number]     [facility]  Allergies  Allergen Reactions  . Codeine     sick  . Morphine And Related Other (See Comments)    unknown  . Percocet [Oxycodone-Acetaminophen]     unknown  . Plavix [Clopidogrel Bisulfate] Other (See Comments)    unknown  . Sulfur Other (See Comments)    unknown     Chief Complaint  Patient presents with  . Medical Managment of Chronic Issues    HPI:  She is being seen for the management of her chronic illnesses. Overall her status remains without significant change. There are no concerns being voiced by the nursing staff at this time. She is not voicing any concerns or complaints at this.    Past Medical History  Diagnosis Date  . SOB (shortness of breath)   . MI (myocardial infarction)   . Poor appetite   . Arthritis   . Fatigue   . Right ear pain   . Renal insufficiency   . RA (rheumatoid arthritis)   . PUD (peptic ulcer disease)   . GI bleed 2005  . GERD (gastroesophageal reflux disease)   . Hiatal hernia   . Hypothyroidism   . Osteoporosis   . Depression   . Anxiety   . Coronary artery disease   . CVA (cerebral vascular accident)   . TIA (transient ischemic attack)   . Fall   . DVT of lower extremity (deep venous thrombosis)   . HCAP (healthcare-associated pneumonia) 01/22/2013  . Enteritis due to Clostridium difficile 01/04/2013  . Sepsis 12/19/2012  . Recurrent colitis due to Clostridium difficile 06/02/2012    Past Surgical History  Procedure Laterality Date  . Nstemi  06/2010  . Spinal fusion surgery    . Knee surgery    . Cardiac catheterization      SHOWED RUPTURE PLAQUE IN THE LAD. THE LAD IS NONOBSTRUCTIVE WITH ONLY 30-40% STENOSIS    VITAL SIGNS BP 114/68  Pulse 74  Ht 5\' 6"  (1.676 m)  Wt 155 lb 6.4 oz (70.489 kg)  BMI 25.09 kg/m2   Patient's Medications  New Prescriptions   No medications on file  Previous Medications   ALBUTEROL  (PROVENTIL HFA;VENTOLIN HFA) 108 (90 BASE) MCG/ACT INHALER    Inhale 1 puff into the lungs every 6 (six) hours as needed for wheezing or shortness of breath.   AMLODIPINE (NORVASC) 10 MG TABLET    Take 1 tablet (10 mg total) by mouth daily.   ASPIRIN 81 MG CHEWABLE TABLET    Chew 81 mg by mouth daily.   DEXTROMETHORPHAN-GUAIFENESIN (MUCINEX DM) 30-600 MG PER 12 HR TABLET    Take 1 tablet by mouth 2 (two) times daily as needed for cough.   ESOMEPRAZOLE (NEXIUM) 20 MG CAPSULE    Take 20 mg by mouth 2 (two) times daily before a meal.    FERROUS SULFATE 325 (65 FE) MG TABLET    Take 325 mg by mouth daily with breakfast.   FEXOFENADINE (ALLEGRA) 180 MG TABLET    Take 1 tablet (180 mg total) by mouth daily.   FOLIC ACID (FOLVITE) 1 MG TABLET    Take 1 mg by mouth daily.   IPRATROPIUM (ATROVENT) 0.03 % NASAL SPRAY    Place 2 sprays into both nostrils every 12 (twelve) hours.   LEVOTHYROXINE (SYNTHROID, LEVOTHROID) 50 MCG TABLET    Take 50 mcg by mouth daily.  LORAZEPAM (ATIVAN) 0.5 MG TABLET    Take one tablet by mouth every night at bedtime for anxiety   METOPROLOL (LOPRESSOR) 50 MG TABLET    Take 50 mg by mouth 2 (two) times daily.    MULTIPLE VITAMIN (MULTIVITAMIN) TABLET    Take 1 tablet by mouth daily.     OMEGA-3 FATTY ACIDS (FISH OIL) 1200 MG CAPS    Take 1,200 mg by mouth daily.   POLYETHYL GLYCOL-PROPYL GLYCOL (SYSTANE) 0.4-0.3 % SOLN    Apply 1 drop to eye 2 (two) times daily.   PREDNISONE (DELTASONE) 10 MG TABLET    Take 10 mg by mouth daily.   ROSUVASTATIN (CRESTOR) 20 MG TABLET    Take 20 mg by mouth every evening.    SERTRALINE (ZOLOFT) 50 MG TABLET    Take 50 mg by mouth every morning.  Modified Medications   No medications on file  Discontinued Medications   No medications on file    SIGNIFICANT DIAGNOSTIC EXAMS   06-10-13: chest x-ray: no acute cardiopulmonary disease   LABS REVIEWED:   01-10-13; glucose 82; bun 33; creat 1.3; k+4.3; na++141  01-26-13: wbc 9.6; hgb 9.0; hct  28.8 ;mcv 90.9; plt 262; chol 97; ldl 28; trig 178; tsh 1.089  03-31-13: wbc 18.3; hgb 12.1; hct 39.0; mcv 91.3 plt 182; glucose 90; bun 28; creat 1.1;k+ 3.5;na++ 139 04-15-13: wbc 10.3; hgb 11.8; hct 37.9; mcv 90.7. plt 177 tsh 0.369 04-20-13: tsh 0.314 05-20-13: tsh 0.580 06-21-13: chol 169; ldl 87; trig 409 06-27-13: urine culture: p mirabilis: rocephin  07-06-13 tsh 0.83 07-13-13: stool for c-diff: neg     Review of Systems  Constitutional: Negative for malaise/fatigue.  Respiratory: negative for shortness of breath . Negative for cough.   Cardiovascular: Negative for chest pain and palpitations.  Gastrointestinal: Negative for heartburn and constipation.  Genitourinary: Negative for dysuria.  Musculoskeletal: Negative for joint pain and myalgias.  Skin: no complaints  Neurological: Negative for weakness and headaches.    Physical Exam  Constitutional: She is oriented to person, place, and time. She appears well-developed and well-nourished. No distress.  Neck: Neck supple. No JVD present.  Cardiovascular: Normal rate, regular rhythm and intact distal pulses.   Respiratory: Effort normal and breath sounds normal. No respiratory distress. She has no wheezes.  GI: Soft. Bowel sounds are normal. She exhibits no distension. There is no tenderness.  Musculoskeletal: Normal range of motion. She exhibits no edema.  Neurological: She is alert and oriented to person, place, and time.  Skin: Skin is warm and dry. She is not diaphoretic.  Psychiatric: She has a normal mood and affect.     ASSESSMENT/ PLAN:  1. Dyslipidemia: will continue her crestor 20 mg daily and fish oil 1200 mg daily   2. Hypertension: will continue lopressor 50 mg twice daily  norvac 10 mg daily ans asa 81 mg daily and will monitor   3. Jerrye Bushy: will continue nexium 20 mg twice daily is on long term florastor for her history of c-diff colitis.   4. Anemia; will continue iron daily   5. Adrenal insuffiency: is  stable will continue prednisone 10 mg daily and will monitor   6. Allergic rhinitis: will continue allegra 180 mg daily and atrovent nasal spray twice daily   7. Depression and anxiety she is emotionally stable will continue zoloft 50 mg daily and ativan 0.5 mg nightly for anxiety and will monitor her status   8. RA: she is stable her pain is  stable prednisone 10 mg daily and will monitor her status.   9. Old cva: she is neurology intact and stable will continue asa 81 mg daily          Ok Edwards NP [facility]  Contact [phone] Monday through Friday 8am- 5pm  After hours call [phone]

## 2013-09-02 ENCOUNTER — Other Ambulatory Visit: Payer: Self-pay | Admitting: *Deleted

## 2013-09-02 MED ORDER — LORAZEPAM 0.5 MG PO TABS: ORAL_TABLET | ORAL | Status: DC

## 2013-09-02 NOTE — Telephone Encounter (Signed)
[facility] 

## 2013-09-15 DIAGNOSIS — B351 Tinea unguium: Secondary | ICD-10-CM | POA: Diagnosis not present

## 2013-09-15 DIAGNOSIS — M79609 Pain in unspecified limb: Secondary | ICD-10-CM | POA: Diagnosis not present

## 2013-09-15 DIAGNOSIS — I70209 Unspecified atherosclerosis of native arteries of extremities, unspecified extremity: Secondary | ICD-10-CM | POA: Diagnosis not present

## 2013-09-15 DIAGNOSIS — G589 Mononeuropathy, unspecified: Secondary | ICD-10-CM | POA: Diagnosis not present

## 2013-09-19 ENCOUNTER — Non-Acute Institutional Stay (SKILLED_NURSING_FACILITY): Payer: Medicare Other | Admitting: Adult Health

## 2013-09-19 DIAGNOSIS — M069 Rheumatoid arthritis, unspecified: Secondary | ICD-10-CM

## 2013-09-19 DIAGNOSIS — F3289 Other specified depressive episodes: Secondary | ICD-10-CM

## 2013-09-19 DIAGNOSIS — J309 Allergic rhinitis, unspecified: Secondary | ICD-10-CM

## 2013-09-19 DIAGNOSIS — K219 Gastro-esophageal reflux disease without esophagitis: Secondary | ICD-10-CM | POA: Diagnosis not present

## 2013-09-19 DIAGNOSIS — I10 Essential (primary) hypertension: Secondary | ICD-10-CM

## 2013-09-19 DIAGNOSIS — E039 Hypothyroidism, unspecified: Secondary | ICD-10-CM

## 2013-09-19 DIAGNOSIS — F411 Generalized anxiety disorder: Secondary | ICD-10-CM

## 2013-09-19 DIAGNOSIS — D649 Anemia, unspecified: Secondary | ICD-10-CM

## 2013-09-19 DIAGNOSIS — F419 Anxiety disorder, unspecified: Secondary | ICD-10-CM

## 2013-09-19 DIAGNOSIS — E2749 Other adrenocortical insufficiency: Secondary | ICD-10-CM | POA: Diagnosis not present

## 2013-09-19 DIAGNOSIS — E2740 Unspecified adrenocortical insufficiency: Secondary | ICD-10-CM

## 2013-09-19 DIAGNOSIS — F32A Depression, unspecified: Secondary | ICD-10-CM

## 2013-09-19 DIAGNOSIS — F329 Major depressive disorder, single episode, unspecified: Secondary | ICD-10-CM

## 2013-09-19 DIAGNOSIS — E785 Hyperlipidemia, unspecified: Secondary | ICD-10-CM

## 2013-09-21 DIAGNOSIS — L820 Inflamed seborrheic keratosis: Secondary | ICD-10-CM | POA: Diagnosis not present

## 2013-09-21 DIAGNOSIS — L909 Atrophic disorder of skin, unspecified: Secondary | ICD-10-CM | POA: Diagnosis not present

## 2013-09-24 NOTE — Progress Notes (Signed)
Patient ID: [name], female   DOB: Feb 24, 1930, 80 y.o.   MRN: [id number]    [facility]  Allergies  Allergen Reactions  . Codeine     sick  . Morphine And Related Other (See Comments)    unknown  . Percocet [Oxycodone-Acetaminophen]     unknown  . Plavix [Clopidogrel Bisulfate] Other (See Comments)    unknown  . Sulfur Other (See Comments)    unknown     Chief Complaint  Patient presents with  . Medical Managment of Chronic Issues    HPI:  She is being seen for the management of her chronic illnesses. Overall her status remains without change. Her folic acid level is >62 and can be stopped at this time. There are no concerns being voiced by the nursing staff at this time. She is not voicing any concerns at this time.    Past Medical History  Diagnosis Date  . SOB (shortness of breath)   . MI (myocardial infarction)   . Poor appetite   . Arthritis   . Fatigue   . Right ear pain   . Renal insufficiency   . RA (rheumatoid arthritis)   . PUD (peptic ulcer disease)   . GI bleed 2005  . GERD (gastroesophageal reflux disease)   . Hiatal hernia   . Hypothyroidism   . Osteoporosis   . Depression   . Anxiety   . Coronary artery disease   . CVA (cerebral vascular accident)   . TIA (transient ischemic attack)   . Fall   . DVT of lower extremity (deep venous thrombosis)   . HCAP (healthcare-associated pneumonia) 01/22/2013  . Enteritis due to Clostridium difficile 01/04/2013  . Sepsis 12/19/2012  . Recurrent colitis due to Clostridium difficile 06/02/2012    Past Surgical History  Procedure Laterality Date  . Nstemi  06/2010  . Spinal fusion surgery    . Knee surgery    . Cardiac catheterization      SHOWED RUPTURE PLAQUE IN THE LAD. THE LAD IS NONOBSTRUCTIVE WITH ONLY 30-40% STENOSIS    VITAL SIGNS BP 129/78  Pulse 69  Ht 5\' 6"  (1.676 m)  Wt 155 lb 9.6 oz (70.58 kg)  BMI 25.13 kg/m2   Patient's Medications  New Prescriptions   No medications on file    Previous Medications   ALBUTEROL (PROVENTIL HFA;VENTOLIN HFA) 108 (90 BASE) MCG/ACT INHALER    Inhale 1 puff into the lungs every 6 (six) hours as needed for wheezing or shortness of breath.   AMLODIPINE (NORVASC) 10 MG TABLET    Take 1 tablet (10 mg total) by mouth daily.   ASPIRIN 81 MG CHEWABLE TABLET    Chew 81 mg by mouth daily.   DEXTROMETHORPHAN-GUAIFENESIN (MUCINEX DM) 30-600 MG PER 12 HR TABLET    Take 1 tablet by mouth 2 (two) times daily as needed for cough.   ESOMEPRAZOLE (NEXIUM) 20 MG CAPSULE    Take 20 mg by mouth daily.    FERROUS SULFATE 325 (65 FE) MG TABLET    Take 325 mg by mouth daily with breakfast.   FEXOFENADINE (ALLEGRA) 180 MG TABLET    Take 1 tablet (180 mg total) by mouth daily.   FOLIC ACID (FOLVITE) 1 MG TABLET    Take 1 mg by mouth daily.   IPRATROPIUM (ATROVENT) 0.03 % NASAL SPRAY    Place 2 sprays into both nostrils every 12 (twelve) hours.   LEVOTHYROXINE (SYNTHROID, LEVOTHROID) 50 MCG TABLET    Take  50 mcg by mouth daily.    LORAZEPAM (ATIVAN) 0.5 MG TABLET    Take one tablet by mouth every night at bedtime for anxiety   METOPROLOL (LOPRESSOR) 50 MG TABLET    Take 50 mg by mouth 2 (two) times daily.    MULTIPLE VITAMIN (MULTIVITAMIN) TABLET    Take 1 tablet by mouth daily.     OMEGA-3 FATTY ACIDS (FISH OIL) 1200 MG CAPS    Take 1,200 mg by mouth daily.   POLYETHYL GLYCOL-PROPYL GLYCOL (SYSTANE) 0.4-0.3 % SOLN    Apply 1 drop to eye 2 (two) times daily.   PREDNISONE (DELTASONE) 10 MG TABLET    Take 10 mg by mouth daily.   ROSUVASTATIN (CRESTOR) 20 MG TABLET    Take 20 mg by mouth every evening.    SERTRALINE (ZOLOFT) 50 MG TABLET    Take 50 mg by mouth every morning.  Modified Medications   No medications on file  Discontinued Medications   No medications on file    SIGNIFICANT DIAGNOSTIC EXAMS   06-10-13: chest x-ray: no acute cardiopulmonary disease   LABS REVIEWED:   01-10-13; glucose 82; bun 33; creat 1.3; k+4.3; na++141  01-26-13: wbc 9.6; hgb  9.0; hct 28.8 ;mcv 90.9; plt 262; chol 97; ldl 28; trig 178; tsh 1.089  03-31-13: wbc 18.3; hgb 12.1; hct 39.0; mcv 91.3 plt 182; glucose 90; bun 28; creat 1.1;k+ 3.5;na++ 139 04-15-13: wbc 10.3; hgb 11.8; hct 37.9; mcv 90.7. plt 177 tsh 0.369 04-20-13: tsh 0.314 05-20-13: tsh 0.580 06-21-13: chol 169; ldl 87; trig 409 06-27-13: urine culture: p mirabilis: rocephin  07-06-13 tsh 0.83 07-13-13: stool for c-diff: neg  08-04-13: wbc 12; hgb 11.4; hct 33.2; mcv 87; plt 150; glucose 72; bun 25; creat 1.3; k+3.7; na++141; liver normal albumin 3.3 Vit W23: 762; folic >83;     Review of Systems  Constitutional: Negative for malaise/fatigue.  Respiratory: negative for shortness of breath . Negative for cough.   Cardiovascular: Negative for chest pain and palpitations.  Gastrointestinal: Negative for heartburn and constipation.  Genitourinary: Negative for dysuria.  Musculoskeletal: Negative for joint pain and myalgias.  Skin: no complaints  Neurological: Negative for weakness and headaches.    Physical Exam  Constitutional: She is oriented to person, place, and time. She appears well-developed and well-nourished. No distress.  Neck: Neck supple. No JVD present.  Cardiovascular: Normal rate, regular rhythm and intact distal pulses.   Respiratory: Effort normal and breath sounds normal. No respiratory distress. She has no wheezes.  GI: Soft. Bowel sounds are normal. She exhibits no distension. There is no tenderness.  Musculoskeletal: Normal range of motion. She exhibits no edema.  Neurological: She is alert and oriented to person, place, and time.  Skin: Skin is warm and dry. She is not diaphoretic.  Psychiatric: She has a normal mood and affect.     ASSESSMENT/ PLAN:  1. Dyslipidemia: will continue her crestor 20 mg daily and fish oil 1200 mg daily   2. Hypertension: will continue lopressor 50 mg twice daily  norvac 10 mg daily ans asa 81 mg daily and will monitor   3. Jerrye Bushy: will continue  nexium 20 mg  daily is on long term florastor for her history of c-diff colitis.   4. Anemia; will continue iron daily   5. Adrenal insuffiency: is stable will continue prednisone 10 mg daily and will monitor   6. Allergic rhinitis: will continue allegra 180 mg daily and atrovent nasal spray twice daily  7. Depression and anxiety she is emotionally stable will continue zoloft 50 mg daily and ativan 0.5 mg nightly for anxiety and will monitor her status   8. RA: she is stable her pain is stable prednisone 10 mg daily and will monitor her status.   9. Old cva: she is neurology intact and stable will continue asa 81 mg daily       Ok Edwards NP [facility]  Contact [phone] Monday through Friday 8am- 5pm  After hours call [phone]

## 2013-10-03 DIAGNOSIS — Z79899 Other long term (current) drug therapy: Secondary | ICD-10-CM | POA: Diagnosis not present

## 2013-10-03 DIAGNOSIS — E785 Hyperlipidemia, unspecified: Secondary | ICD-10-CM | POA: Diagnosis not present

## 2013-10-03 LAB — HEPATIC FUNCTION PANEL
ALK PHOS: 53 U/L (ref 25–125)
ALT: 20 U/L (ref 7–35)
AST: 18 U/L (ref 13–35)
Bilirubin, Total: 0.5 mg/dL

## 2013-10-03 LAB — LIPID PANEL
CHOLESTEROL: 131 mg/dL (ref 0–200)
LDL CALC: 42 mg/dL
Triglycerides: 245 mg/dL — AB (ref 40–160)

## 2013-10-17 ENCOUNTER — Non-Acute Institutional Stay (SKILLED_NURSING_FACILITY): Payer: Medicare Other | Admitting: Adult Health

## 2013-10-17 DIAGNOSIS — D649 Anemia, unspecified: Secondary | ICD-10-CM

## 2013-10-17 DIAGNOSIS — E2749 Other adrenocortical insufficiency: Secondary | ICD-10-CM | POA: Diagnosis not present

## 2013-10-17 DIAGNOSIS — I10 Essential (primary) hypertension: Secondary | ICD-10-CM | POA: Diagnosis not present

## 2013-10-17 DIAGNOSIS — K219 Gastro-esophageal reflux disease without esophagitis: Secondary | ICD-10-CM

## 2013-10-17 DIAGNOSIS — F329 Major depressive disorder, single episode, unspecified: Secondary | ICD-10-CM

## 2013-10-17 DIAGNOSIS — M069 Rheumatoid arthritis, unspecified: Secondary | ICD-10-CM | POA: Diagnosis not present

## 2013-10-17 DIAGNOSIS — I639 Cerebral infarction, unspecified: Secondary | ICD-10-CM

## 2013-10-17 DIAGNOSIS — I6350 Cerebral infarction due to unspecified occlusion or stenosis of unspecified cerebral artery: Secondary | ICD-10-CM

## 2013-10-17 DIAGNOSIS — E2740 Unspecified adrenocortical insufficiency: Secondary | ICD-10-CM

## 2013-10-17 DIAGNOSIS — F32A Depression, unspecified: Secondary | ICD-10-CM

## 2013-10-17 DIAGNOSIS — A0472 Enterocolitis due to Clostridium difficile, not specified as recurrent: Secondary | ICD-10-CM

## 2013-10-17 DIAGNOSIS — J309 Allergic rhinitis, unspecified: Secondary | ICD-10-CM

## 2013-10-17 DIAGNOSIS — A0471 Enterocolitis due to Clostridium difficile, recurrent: Secondary | ICD-10-CM

## 2013-10-17 DIAGNOSIS — F3289 Other specified depressive episodes: Secondary | ICD-10-CM

## 2013-10-18 ENCOUNTER — Other Ambulatory Visit: Payer: Self-pay | Admitting: Internal Medicine

## 2013-10-18 DIAGNOSIS — M810 Age-related osteoporosis without current pathological fracture: Secondary | ICD-10-CM

## 2013-10-18 DIAGNOSIS — Z1231 Encounter for screening mammogram for malignant neoplasm of breast: Secondary | ICD-10-CM

## 2013-10-25 DIAGNOSIS — H40019 Open angle with borderline findings, low risk, unspecified eye: Secondary | ICD-10-CM | POA: Diagnosis not present

## 2013-10-25 DIAGNOSIS — H524 Presbyopia: Secondary | ICD-10-CM | POA: Diagnosis not present

## 2013-10-25 DIAGNOSIS — H353190 Nonexudative age-related macular degeneration, unspecified eye, stage unspecified: Secondary | ICD-10-CM | POA: Diagnosis not present

## 2013-10-25 DIAGNOSIS — Z961 Presence of intraocular lens: Secondary | ICD-10-CM | POA: Diagnosis not present

## 2013-10-27 ENCOUNTER — Encounter: Payer: Self-pay | Admitting: Adult Health

## 2013-10-27 NOTE — Progress Notes (Signed)
Patient ID: [name], female   DOB: 20-Dec-1929, 80 y.o.   MRN: [id number]     [facility]  Allergies  Allergen Reactions  . Codeine     sick  . Morphine And Related Other (See Comments)    unknown  . Percocet [Oxycodone-Acetaminophen]     unknown  . Plavix [Clopidogrel Bisulfate] Other (See Comments)    unknown  . Sulfur Other (See Comments)    unknown     Chief Complaint  Patient presents with  . Annual Exam    HPI:  She is being seen for her annual exam. Overall she has remained stable for the past several months. She is not voicing any complaints at this time. The nursing staff is not voicing any complaints. She will need a screening mammogram and will need a dexa scan done; otherwise her health maintenance is up to date.    Past Medical History  Diagnosis Date  . SOB (shortness of breath)   . MI (myocardial infarction)   . Poor appetite   . Arthritis   . Fatigue   . Right ear pain   . Renal insufficiency   . RA (rheumatoid arthritis)   . PUD (peptic ulcer disease)   . GI bleed 2005  . GERD (gastroesophageal reflux disease)   . Hiatal hernia   . Hypothyroidism   . Osteoporosis   . Depression   . Anxiety   . Coronary artery disease   . CVA (cerebral vascular accident)   . TIA (transient ischemic attack)   . Fall   . DVT of lower extremity (deep venous thrombosis)   . HCAP (healthcare-associated pneumonia) 01/22/2013  . Enteritis due to Clostridium difficile 01/04/2013  . Sepsis 12/19/2012  . Recurrent colitis due to Clostridium difficile 06/02/2012    Past Surgical History  Procedure Laterality Date  . Nstemi  06/2010  . Spinal fusion surgery    . Knee surgery    . Cardiac catheterization      SHOWED RUPTURE PLAQUE IN THE LAD. THE LAD IS NONOBSTRUCTIVE WITH ONLY 30-40% STENOSIS    History   Social History  . Marital Status: Widowed    Spouse Name: N/A    Number of Children: N/A  . Years of Education: N/A   Occupational History  . Not on  file.   Social History Main Topics  . Smoking status: Never Smoker   . Smokeless tobacco: Never Used  . Alcohol Use: No  . Drug Use: No  . Sexual Activity: No   Other Topics Concern  . Not on file   Social History Narrative   Patient used to work at [employer] for 25 years until she retired   She lives by herself in [place] until she had a stroke earlier in 2013 June   Her next of kin is her daughter   She does get physical therapy was everyday at [facility]      Caffeine use: coffee in the AM     Family History  Problem Relation Age of Onset  . Kidney disease Mother   . Kidney disease Brother     CONSULTS [facility] dermatology [facility] oral surgeon [facility]    VITAL SIGNS BP 128/79  Pulse 68  Ht 5\' 6"  (1.676 m)  Wt 155 lb (70.308 kg)  BMI 25.03 kg/m2   Patient's Medications  New Prescriptions   No medications on file  Previous Medications   ALBUTEROL (PROVENTIL HFA;VENTOLIN HFA) 108 (90 BASE)  MCG/ACT INHALER    Inhale 1 puff into the lungs every 6 (six) hours as needed for wheezing or shortness of breath.   AMLODIPINE (NORVASC) 10 MG TABLET    Take 1 tablet (10 mg total) by mouth daily.   ASPIRIN 81 MG CHEWABLE TABLET    Chew 81 mg by mouth daily.   DEXTROMETHORPHAN-GUAIFENESIN (MUCINEX DM) 30-600 MG PER 12 HR TABLET    Take 1 tablet by mouth 2 (two) times daily as needed for cough.   ESOMEPRAZOLE (NEXIUM) 20 MG CAPSULE    Take 20 mg by mouth daily.    FERROUS SULFATE 325 (65 FE) MG TABLET    Take 325 mg by mouth daily with breakfast.   FEXOFENADINE (ALLEGRA) 180 MG TABLET    Take 1 tablet (180 mg total) by mouth daily.   IPRATROPIUM (ATROVENT) 0.03 % NASAL SPRAY    Place 2 sprays into both nostrils every 12 (twelve) hours.   LEVOTHYROXINE (SYNTHROID, LEVOTHROID) 50 MCG TABLET    Take 50 mcg by mouth daily.    LORAZEPAM (ATIVAN) 0.5 MG TABLET    Take one tablet by mouth every night at bedtime for anxiety   METOPROLOL (LOPRESSOR) 50 MG  TABLET    Take 50 mg by mouth 2 (two) times daily.    MULTIPLE VITAMIN (MULTIVITAMIN) TABLET    Take 1 tablet by mouth daily.     OMEGA-3 FATTY ACIDS (FISH OIL) 1200 MG CAPS    Take 1,200 mg by mouth daily.   POLYETHYL GLYCOL-PROPYL GLYCOL (SYSTANE) 0.4-0.3 % SOLN    Apply 1 drop to eye 2 (two) times daily.   PREDNISONE (DELTASONE) 10 MG TABLET    Take 10 mg by mouth daily.   ROSUVASTATIN (CRESTOR) 20 MG TABLET    Take 20 mg by mouth every evening.    SERTRALINE (ZOLOFT) 50 MG TABLET    Take 50 mg by mouth every morning.  Modified Medications   No medications on file  Discontinued Medications   No medications on file    SIGNIFICANT DIAGNOSTIC EXAMS  06-10-13: chest x-ray: no acute cardiopulmonary disease   LABS REVIEWED:   01-10-13; glucose 82; bun 33; creat 1.3; k+4.3; na++141  01-26-13: wbc 9.6; hgb 9.0; hct 28.8 ;mcv 90.9; plt 262; chol 97; ldl 28; trig 178; tsh 1.089  03-31-13: wbc 18.3; hgb 12.1; hct 39.0; mcv 91.3 plt 182; glucose 90; bun 28; creat 1.1;k+ 3.5;na++ 139 04-15-13: wbc 10.3; hgb 11.8; hct 37.9; mcv 90.7. plt 177 tsh 0.369 04-20-13: tsh 0.314 05-20-13: tsh 0.580 06-21-13: chol 169; ldl 87; trig 409 06-27-13: urine culture: p mirabilis: rocephin  07-06-13 tsh 0.83 07-13-13: stool for c-diff: neg  08-04-13: wbc 12; hgb 11.4; hct 33.2; mcv 87; plt 150; glucose 72; bun 25; creat 1.3; k+3.7; na++141; liver normal albumin 3.3 Vit U20: 254; folic >27;     Review of Systems  Constitutional: Negative for malaise/fatigue.  Respiratory: negative for shortness of breath . Negative for cough.   Cardiovascular: Negative for chest pain and palpitations.  Gastrointestinal: Negative for heartburn and constipation.  Genitourinary: Negative for dysuria.  Musculoskeletal: Negative for joint pain and myalgias.  Skin: no complaints  Neurological: Negative for weakness and headaches.    Physical Exam  Constitutional: She is oriented to person, place, and time. She appears  well-developed and well-nourished. No distress.  Neck: Neck supple. No JVD present.  Cardiovascular: Normal rate, regular rhythm and intact distal pulses.   Respiratory: Effort normal and breath sounds normal. No  respiratory distress. She has no wheezes.  GI: Soft. Bowel sounds are normal. She exhibits no distension. There is no tenderness.  Musculoskeletal: Normal range of motion. She exhibits no edema.  Neurological: She is alert and oriented to person, place, and time.  Skin: Skin is warm and dry. She is not diaphoretic.  Psychiatric: She has a normal mood and affect.     ASSESSMENT/ PLAN:  1. Dyslipidemia: will continue her crestor 20 mg daily and fish oil 1200 mg daily   2. Hypertension: will continue lopressor 50 mg twice daily  norvac 10 mg daily ans asa 81 mg daily and will monitor   3. Jerrye Bushy: will continue nexium 20 mg  daily is on long term florastor for her history of c-diff colitis.   4. Anemia; will continue iron daily   5. Adrenal insuffiency: is stable will continue prednisone 10 mg daily and will monitor   6. Allergic rhinitis: will continue allegra 180 mg daily and atrovent nasal spray twice daily   7. Depression and anxiety she is emotionally stable will continue zoloft 50 mg daily and ativan 0.5 mg nightly for anxiety and will monitor her status   8. RA: she is stable her pain is stable prednisone 10 mg daily and will monitor her status.   9. Old cva: she is neurology intact and stable will continue asa 81 mg daily     Will set her up for a screening mammogram and dexa scan         Ok Edwards NP [facility] Adult [facility]  Contact [phone] Monday through Friday 8am- 5pm  After hours call [phone]

## 2013-10-31 ENCOUNTER — Ambulatory Visit (HOSPITAL_COMMUNITY): Payer: Medicare Other

## 2013-10-31 ENCOUNTER — Ambulatory Visit (HOSPITAL_COMMUNITY): Admission: RE | Admit: 2013-10-31 | Payer: Medicare Other | Source: Ambulatory Visit

## 2013-11-03 ENCOUNTER — Ambulatory Visit (HOSPITAL_COMMUNITY): Payer: Medicare Other

## 2013-11-09 ENCOUNTER — Ambulatory Visit (HOSPITAL_COMMUNITY)
Admission: RE | Admit: 2013-11-09 | Discharge: 2013-11-09 | Disposition: A | Discharge status: Home / Self Care | Payer: Medicare Other | Source: Ambulatory Visit | Attending: Internal Medicine | Admitting: Internal Medicine

## 2013-11-09 DIAGNOSIS — Z13820 Encounter for screening for osteoporosis: Secondary | ICD-10-CM | POA: Diagnosis not present

## 2013-11-09 DIAGNOSIS — Z780 Asymptomatic menopausal state: Secondary | ICD-10-CM | POA: Diagnosis not present

## 2013-11-09 DIAGNOSIS — M810 Age-related osteoporosis without current pathological fracture: Secondary | ICD-10-CM | POA: Diagnosis not present

## 2013-11-09 DIAGNOSIS — Z1231 Encounter for screening mammogram for malignant neoplasm of breast: Secondary | ICD-10-CM

## 2013-11-10 ENCOUNTER — Encounter: Payer: Self-pay | Admitting: *Deleted

## 2013-11-14 ENCOUNTER — Non-Acute Institutional Stay (SKILLED_NURSING_FACILITY): Payer: Medicare Other | Admitting: Adult Health

## 2013-11-14 DIAGNOSIS — E2749 Other adrenocortical insufficiency: Secondary | ICD-10-CM

## 2013-11-14 DIAGNOSIS — M069 Rheumatoid arthritis, unspecified: Secondary | ICD-10-CM | POA: Diagnosis not present

## 2013-11-14 DIAGNOSIS — F411 Generalized anxiety disorder: Secondary | ICD-10-CM

## 2013-11-14 DIAGNOSIS — E039 Hypothyroidism, unspecified: Secondary | ICD-10-CM

## 2013-11-14 DIAGNOSIS — F329 Major depressive disorder, single episode, unspecified: Secondary | ICD-10-CM

## 2013-11-14 DIAGNOSIS — D649 Anemia, unspecified: Secondary | ICD-10-CM

## 2013-11-14 DIAGNOSIS — J309 Allergic rhinitis, unspecified: Secondary | ICD-10-CM

## 2013-11-14 DIAGNOSIS — K219 Gastro-esophageal reflux disease without esophagitis: Secondary | ICD-10-CM

## 2013-11-14 DIAGNOSIS — I6350 Cerebral infarction due to unspecified occlusion or stenosis of unspecified cerebral artery: Secondary | ICD-10-CM

## 2013-11-14 DIAGNOSIS — F3289 Other specified depressive episodes: Secondary | ICD-10-CM

## 2013-11-14 DIAGNOSIS — E2740 Unspecified adrenocortical insufficiency: Secondary | ICD-10-CM

## 2013-11-14 DIAGNOSIS — F419 Anxiety disorder, unspecified: Secondary | ICD-10-CM

## 2013-11-14 DIAGNOSIS — I10 Essential (primary) hypertension: Secondary | ICD-10-CM

## 2013-11-14 DIAGNOSIS — I639 Cerebral infarction, unspecified: Secondary | ICD-10-CM

## 2013-11-14 DIAGNOSIS — F32A Depression, unspecified: Secondary | ICD-10-CM

## 2013-11-14 DIAGNOSIS — E785 Hyperlipidemia, unspecified: Secondary | ICD-10-CM

## 2013-11-21 ENCOUNTER — Encounter: Payer: Self-pay | Admitting: Adult Health

## 2013-11-21 MED ORDER — VITAMIN D 50 MCG (2000 UT) PO CAPS: 2000.0000 [IU] | ORAL_CAPSULE | Freq: Every day | ORAL | Status: AC

## 2013-11-21 MED ORDER — CALCIUM CARBONATE ANTACID 500 MG PO CHEW: 1.0000 | CHEWABLE_TABLET | Freq: Two times a day (BID) | ORAL | Status: DC

## 2013-11-21 NOTE — Progress Notes (Signed)
Patient ID: [name], female   DOB: 05-Mar-1930, 80 y.o.   MRN: [id number]     [facility]  Allergies  Allergen Reactions  . Biphosphate   . Codeine     sick  . Morphine And Related Other (See Comments)    unknown  . Percocet [Oxycodone-Acetaminophen]     unknown  . Plavix [Clopidogrel Bisulfate] Other (See Comments)    unknown  . Sulfur Other (See Comments)    unknown      Chief Complaint  Patient presents with  . Medical Management of Chronic Issues    HPI:  She is being seen for the management of her chronic illnesses. There are no significant change in her status. she states she cannot take biphosphates for osteoporosis.  There are no concerns being voiced by the nursing staff at this time.   Past Medical History  Diagnosis Date  . SOB (shortness of breath)   . MI (myocardial infarction)   . Poor appetite   . Arthritis   . Fatigue   . Right ear pain   . Renal insufficiency   . RA (rheumatoid arthritis)   . PUD (peptic ulcer disease)   . GI bleed 2005  . GERD (gastroesophageal reflux disease)   . Hiatal hernia   . Hypothyroidism   . Osteoporosis   . Depression   . Anxiety   . Coronary artery disease   . CVA (cerebral vascular accident)   . TIA (transient ischemic attack)   . Fall   . DVT of lower extremity (deep venous thrombosis)   . HCAP (healthcare-associated pneumonia) 01/22/2013  . Enteritis due to Clostridium difficile 01/04/2013  . Sepsis 12/19/2012  . Recurrent colitis due to Clostridium difficile 06/02/2012    Past Surgical History  Procedure Laterality Date  . Nstemi  06/2010  . Spinal fusion surgery    . Knee surgery    . Cardiac catheterization      SHOWED RUPTURE PLAQUE IN THE LAD. THE LAD IS NONOBSTRUCTIVE WITH ONLY 30-40% STENOSIS    VITAL SIGNS BP 119/70  Pulse 79  Ht 5\' 6"  (1.676 m)  Wt 155 lb (70.308 kg)  BMI 25.03 kg/m2   Patient's Medications  New Prescriptions   No medications on file  Previous Medications   ALBUTEROL (PROVENTIL HFA;VENTOLIN HFA) 108 (90 BASE) MCG/ACT INHALER    Inhale 1 puff into the lungs every 6 (six) hours as needed for wheezing or shortness of breath.   AMLODIPINE (NORVASC) 10 MG TABLET    Take 1 tablet (10 mg total) by mouth daily.   ASPIRIN 81 MG CHEWABLE TABLET    Chew 81 mg by mouth daily.   DEXTROMETHORPHAN-GUAIFENESIN (MUCINEX DM) 30-600 MG PER 12 HR TABLET    Take 1 tablet by mouth 2 (two) times daily as needed for cough.   ESOMEPRAZOLE (NEXIUM) 20 MG CAPSULE    Take 20 mg by mouth daily.    FERROUS SULFATE 325 (65 FE) MG TABLET    Take 325 mg by mouth daily with breakfast.   FEXOFENADINE (ALLEGRA) 180 MG TABLET    Take 1 tablet (180 mg total) by mouth daily.   HYDROCODONE-ACETAMINOPHEN (NORCO/VICODIN) 5-325 MG PER TABLET    Take 1 tablet by mouth every 6 (six) hours as needed for moderate pain.   IPRATROPIUM (ATROVENT) 0.03 % NASAL SPRAY    Place 2 sprays into both nostrils every 12 (twelve) hours.   LEVOTHYROXINE (SYNTHROID, LEVOTHROID) 50 MCG TABLET    Take 50 mcg  by mouth daily.    LORAZEPAM (ATIVAN) 0.5 MG TABLET    Take one tablet by mouth every night at bedtime for anxiety   METHOCARBAMOL (ROBAXIN) 500 MG TABLET    Take 500 mg by mouth at bedtime. And every 8 hours as needed   METOPROLOL (LOPRESSOR) 50 MG TABLET    Take 50 mg by mouth 2 (two) times daily.    MULTIPLE VITAMIN (MULTIVITAMIN) TABLET    Take 1 tablet by mouth daily.     OMEGA-3 FATTY ACIDS (FISH OIL) 1200 MG CAPS    Take 1,200 mg by mouth daily.   POLYETHYL GLYCOL-PROPYL GLYCOL (SYSTANE) 0.4-0.3 % SOLN    Apply 1 drop to eye 2 (two) times daily.   PREDNISONE (DELTASONE) 10 MG TABLET    Take 10 mg by mouth daily.   ROSUVASTATIN (CRESTOR) 20 MG TABLET    Take 20 mg by mouth every evening.    SERTRALINE (ZOLOFT) 50 MG TABLET    Take 50 mg by mouth every morning.  Modified Medications   No medications on file  Discontinued Medications   No medications on file    SIGNIFICANT DIAGNOSTIC  EXAMS  06-10-13: chest x-ray: no acute cardiopulmonary disease  11-09-13: dexa; t score -3.9  11-10-13: mammogram; No mammographic evidence of malignancy.    LABS REVIEWED:   01-10-13; glucose 82; bun 33; creat 1.3; k+4.3; na++141  01-26-13: wbc 9.6; hgb 9.0; hct 28.8 ;mcv 90.9; plt 262; chol 97; ldl 28; trig 178; tsh 1.089  03-31-13: wbc 18.3; hgb 12.1; hct 39.0; mcv 91.3 plt 182; glucose 90; bun 28; creat 1.1;k+ 3.5;na++ 139 04-15-13: wbc 10.3; hgb 11.8; hct 37.9; mcv 90.7. plt 177 tsh 0.369 04-20-13: tsh 0.314 05-20-13: tsh 0.580 06-21-13: chol 169; ldl 87; trig 409 06-27-13: urine culture: p mirabilis: rocephin  07-06-13 tsh 0.83 07-13-13: stool for c-diff: neg  08-04-13: wbc 12; hgb 11.4; hct 33.2; mcv 87; plt 150; glucose 72; bun 25; creat 1.3; k+3.7; na++141; liver normal albumin 3.3 Vit A41: 660; folic >63;  0-1-60: liver normal albumin 3.0; chol 131; ldl 42; trig 245     Review of Systems  Constitutional: Negative for malaise/fatigue.  Respiratory: negative for shortness of breath . Negative for cough.   Cardiovascular: Negative for chest pain and palpitations.  Gastrointestinal: Negative for heartburn and constipation.  Genitourinary: Negative for dysuria.  Musculoskeletal: Negative for joint pain and myalgias.  Skin: no complaints  Neurological: Negative for weakness and headaches.    Physical Exam  Constitutional: She is oriented to person, place, and time. She appears well-developed and well-nourished. No distress.  Neck: Neck supple. No JVD present.  Cardiovascular: Normal rate, regular rhythm and intact distal pulses.   Respiratory: Effort normal and breath sounds normal. No respiratory distress. She has no wheezes.  GI: Soft. Bowel sounds are normal. She exhibits no distension. There is no tenderness.  Musculoskeletal: Normal range of motion. She exhibits no edema.  Neurological: She is alert and oriented to person, place, and time.  Skin: Skin is warm and dry. She  is not diaphoretic.  Psychiatric: She has a normal mood and affect.     ASSESSMENT/ PLAN:  1. Dyslipidemia: will continue her crestor 20 mg daily and fish oil 1200 mg daily   2. Hypertension: will continue lopressor 50 mg twice daily  norvac 10 mg daily ans asa 81 mg daily and will monitor   3. Gerd: will continue nexium 20 mg  daily is on long term florastor for  her history of c-diff colitis.   4. Anemia; will continue iron daily   5. Adrenal insuffiency: is stable will continue prednisone 10 mg daily and will monitor   6. Allergic rhinitis: will continue allegra 180 mg daily and atrovent nasal spray twice daily   7. Depression and anxiety she is emotionally stable will continue zoloft 50 mg daily and ativan 0.5 mg nightly for anxiety and will monitor her status   8. RA: she is stable her pain is stable prednisone 10 mg daily has vicodin 5/325 mg every 6 hours as needed for pain and robaxin 500 mg nightly and every 8 hours as needed  and will monitor her status.   9. Old cva: she is neurology intact and stable will continue asa 81 mg daily   10. Osteoporosis: will begin ca++ 500 mg twice daily and vit d 2000 units daily

## 2013-11-24 DIAGNOSIS — N390 Urinary tract infection, site not specified: Secondary | ICD-10-CM | POA: Diagnosis not present

## 2013-12-13 DIAGNOSIS — B351 Tinea unguium: Secondary | ICD-10-CM | POA: Diagnosis not present

## 2013-12-13 DIAGNOSIS — D649 Anemia, unspecified: Secondary | ICD-10-CM | POA: Diagnosis not present

## 2013-12-13 DIAGNOSIS — M129 Arthropathy, unspecified: Secondary | ICD-10-CM | POA: Diagnosis not present

## 2013-12-13 DIAGNOSIS — M79609 Pain in unspecified limb: Secondary | ICD-10-CM | POA: Diagnosis not present

## 2013-12-16 ENCOUNTER — Non-Acute Institutional Stay (SKILLED_NURSING_FACILITY): Payer: Medicare Other | Admitting: Internal Medicine

## 2013-12-16 DIAGNOSIS — E038 Other specified hypothyroidism: Secondary | ICD-10-CM

## 2013-12-16 DIAGNOSIS — I130 Hypertensive heart and chronic kidney disease with heart failure and stage 1 through stage 4 chronic kidney disease, or unspecified chronic kidney disease: Secondary | ICD-10-CM | POA: Insufficient documentation

## 2013-12-16 DIAGNOSIS — F3289 Other specified depressive episodes: Secondary | ICD-10-CM | POA: Diagnosis not present

## 2013-12-16 DIAGNOSIS — I10 Essential (primary) hypertension: Secondary | ICD-10-CM | POA: Diagnosis not present

## 2013-12-16 DIAGNOSIS — F329 Major depressive disorder, single episode, unspecified: Secondary | ICD-10-CM

## 2013-12-16 DIAGNOSIS — I5030 Unspecified diastolic (congestive) heart failure: Secondary | ICD-10-CM

## 2013-12-16 DIAGNOSIS — R0609 Other forms of dyspnea: Secondary | ICD-10-CM | POA: Diagnosis not present

## 2013-12-16 DIAGNOSIS — E785 Hyperlipidemia, unspecified: Secondary | ICD-10-CM | POA: Diagnosis not present

## 2013-12-16 DIAGNOSIS — R0989 Other specified symptoms and signs involving the circulatory and respiratory systems: Secondary | ICD-10-CM

## 2013-12-16 DIAGNOSIS — F32A Depression, unspecified: Secondary | ICD-10-CM

## 2013-12-16 DIAGNOSIS — N183 Chronic kidney disease, stage 3 unspecified: Secondary | ICD-10-CM | POA: Insufficient documentation

## 2013-12-16 DIAGNOSIS — D72829 Elevated white blood cell count, unspecified: Secondary | ICD-10-CM | POA: Insufficient documentation

## 2013-12-16 DIAGNOSIS — E039 Hypothyroidism, unspecified: Secondary | ICD-10-CM | POA: Insufficient documentation

## 2013-12-16 NOTE — Progress Notes (Signed)
Patient ID: [name], female   DOB: [birth year], 80 y.o.   MRN: [id number]   [name]  Chief Complaint  Patient presents with  . Medical Management of Chronic Issues    HTN, HLD, GERD, Depression, exertional dyspnea, leukocytosis  . Allergies  Allergen Reactions  . Biphosphate   . Codeine     sick  . Morphine And Related Other (See Comments)    unknown  . Percocet [Oxycodone-Acetaminophen]     unknown  . Plavix [Clopidogrel Bisulfate] Other (See Comments)    unknown  . Sulfur Other (See Comments)    unknown   Code Status: Full Code  HPI: 80 y/o female patient seen today for routine follow up visit. She has history of HTN, hyperlipidemia,GERD, osteoporosis, adrenal insufficiency among others. She complaints of dyspnea on exertion and would like for me to check her thyroid because she has been feeling cold. Otherwise, she is feeling good. No recent fall reported. Weight stable. No new concern from nursing staff.   Review of Systems  Constitutional: No weight changes, no fever or chills.    HENT: Negative for earache, sore throat, ringing in ears.    Eyes:  No eye discharge, blurry vision, or double vision Respiratory: Negative for cough, hemoptysis, sputum production and wheezing. Positive for shortness of breath with exertion Cardiovascular: Negative for chest pain and palpitations. Negative for leg edema Gastrointestinal: Negative for abdominal pain, nausea, vomiting, constipation  Genitourinary: Negative for dysuria Musculoskeletal: Positive for back pain       Chronic Back Pain/Muscle Spasms  Skin: no sores, ulcers, rash  Neurological:  Negative for headache and syncope Psychiatric/Behavioral: Negative for depression, memory loss  Past Medical History  Diagnosis Date  . SOB (shortness of breath)   . MI (myocardial infarction)   . Poor appetite   . Arthritis   . Fatigue   . Right ear pain   . Renal insufficiency   . RA (rheumatoid arthritis)   .  PUD (peptic ulcer disease)   . GI bleed 2005  . GERD (gastroesophageal reflux disease)   . Hiatal hernia   . Hypothyroidism   . Osteoporosis   . Depression   . Anxiety   . Coronary artery disease   . CVA (cerebral vascular accident)   . TIA (transient ischemic attack)   . Fall   . DVT of lower extremity (deep venous thrombosis)   . HCAP (healthcare-associated pneumonia) 01/22/2013  . Enteritis due to Clostridium difficile 01/04/2013  . Sepsis 12/19/2012  . Recurrent colitis due to Clostridium difficile 06/02/2012   Past Surgical History  Procedure Laterality Date  . Nstemi  06/2010  . Spinal fusion surgery    . Knee surgery    . Cardiac catheterization      SHOWED RUPTURE PLAQUE IN THE LAD. THE LAD IS NONOBSTRUCTIVE WITH ONLY 30-40% STENOSIS   Outpatient Encounter Prescriptions as of 12/16/2013  Medication Sig  . Cranberry 200 MG CAPS Take 2 capsules by mouth 2 (two) times daily.  Marland Kitchen saccharomyces boulardii (FLORASTOR) 250 MG capsule Take 250 mg by mouth daily.  Marland Kitchen albuterol (PROVENTIL HFA;VENTOLIN HFA) 108 (90 BASE) MCG/ACT inhaler Inhale 1 puff into the lungs every 6 (six) hours as needed for wheezing or shortness of breath.  Marland Kitchen amLODipine (NORVASC) 10 MG tablet Take 1 tablet (10 mg total) by mouth daily.  Marland Kitchen aspirin 81 MG chewable tablet Chew 81 mg by mouth daily.  . calcium carbonate (TUMS) 500 MG chewable tablet Chew 1 tablet (  200 mg of elemental calcium total) by mouth 2 (two) times daily.  . Cholecalciferol (VITAMIN D) 2000 UNITS CAPS Take 1 capsule (2,000 Units total) by mouth daily.  Marland Kitchen dextromethorphan-guaiFENesin (MUCINEX DM) 30-600 MG per 12 hr tablet Take 1 tablet by mouth 2 (two) times daily as needed for cough.  . esomeprazole (NEXIUM) 20 MG capsule Take 20 mg by mouth daily.   . ferrous sulfate 325 (65 FE) MG tablet Take 325 mg by mouth daily with breakfast.  . fexofenadine (ALLEGRA) 180 MG tablet Take 1 tablet (180 mg total) by mouth daily.  Marland Kitchen HYDROcodone-acetaminophen  (NORCO/VICODIN) 5-325 MG per tablet Take 1 tablet by mouth every 6 (six) hours as needed for moderate pain.  Marland Kitchen ipratropium (ATROVENT) 0.03 % nasal spray Place 2 sprays into both nostrils every 12 (twelve) hours.  Marland Kitchen levothyroxine (SYNTHROID, LEVOTHROID) 50 MCG tablet Take 50 mcg by mouth daily.   Marland Kitchen LORazepam (ATIVAN) 0.5 MG tablet Take one tablet by mouth every night at bedtime for anxiety  . methocarbamol (ROBAXIN) 500 MG tablet Take 500 mg by mouth at bedtime. And every 8 hours as needed  . metoprolol (LOPRESSOR) 50 MG tablet Take 50 mg by mouth 2 (two) times daily.   . Multiple Vitamin (MULTIVITAMIN) tablet Take 1 tablet by mouth daily.    . Omega-3 Fatty Acids (FISH OIL) 1200 MG CAPS Take 1,200 mg by mouth daily.  Vladimir Faster Glycol-Propyl Glycol (SYSTANE) 0.4-0.3 % SOLN Apply 1 drop to eye 2 (two) times daily.  . predniSONE (DELTASONE) 10 MG tablet Take 10 mg by mouth daily.  . rosuvastatin (CRESTOR) 20 MG tablet Take 20 mg by mouth every evening.   . sertraline (ZOLOFT) 50 MG tablet Take 50 mg by mouth every morning.    Physical Exam: BP 131/73  Pulse 75  Temp(Src) 97.8 F (36.6 C)  Resp 20  Ht 5\' 6"  (1.676 m)  Wt 154 lb 6.4 oz (70.035 kg)  BMI 24.93 kg/m2  General- well-developed, well-nourished elderly female in no acute distress. Very pleasant Head- atraumatic, normocephalic Eyes- PERRLA, EOMI, no pallor, no discharge Neck- has thyromegaly and follows with Dr Delia Chimes, no lymphadenopathy, no jugular vein distension, no carotid bruit Chest- no chest wall deformities, no chest wall tenderness Cardiovascular- normal s1,s2, no murmurs/ rubs/ gallops. Intact pulses. Trace edema of both lower extremities Respiratory- bilateral clear to auscultation, no wheeze, no rhonchi, no crackles Abdomen- bowel sounds present, soft, non tender Musculoskeletal- able to move all 4 extremities, has arthritis changes in her hand. Has kyphosis of spine Neurological- no focal deficit Psychiatry-  alert and oriented to person, place and time, normal mood and affect Skin:warm and dry. Surgical scar on spine  Labs Reviewed:  12-20-12: tsh 0.374 01-25-13: wbc 12.1; hgb 8.3; hct 26.0; mcv 90.0; plt 194; glucose 98; bun 19; creat 1.18; k+4.2;na++141 04-15-13 wbc 10.3, hb 11.8, plt 177 05-20-13 tsh 0.580 09/01/13 wbc 12.0, hb 11.4, hct 33.2, mcv 87, plt 150, na 141, k 3.7, cl 106, gluc 72, bun 25, cr 1.3  10/03/13 ast 18, alt 20, chol 131, ldl 42, trig 245.    ASSESSMENT/ PLAN:  1. Exertional dyspnea Appears euvolemic. In no distress. No signs of fluid overload. Normal respiratory effort and clear breath sounds bilaterally on lung exam making pneumonia unlikely. Is afebrile. Encourage Albuterol MDI PRN. She hasn't use it for relieve of dyspnea. Monitor clinically  2. Essential hypertension Stable. Will continue Norvasc 10mg  daily and metoprolol 50mg  BID. Also continue asa 81mg  daily and  crestor 20mg  daily  3. HLD (hyperlipidemia) Well-controlled. Total chol 131, LDL 42, Trig 245 on 10/03/13. Continue Crestor 20mg  daily and fish oil 1200mg  daily.  4. Depression Continue zoloft 50 mg daily and ativan 0.5 mg nightly as needed for anxiety and will monitor   5. Leukocytosis, unspecified Afebrile. Likely from being on chronic prednisone. Monitor for fever or signs of infection  6. hypothyroidsim Will recheck her tsh. Continue levothyroxine 50 mcg daily for now  Plan of care discuss with nursing staff. Nursing staff verbalize understanding and agree with plan of care

## 2013-12-19 DIAGNOSIS — E039 Hypothyroidism, unspecified: Secondary | ICD-10-CM | POA: Diagnosis not present

## 2014-01-03 ENCOUNTER — Non-Acute Institutional Stay (SKILLED_NURSING_FACILITY): Payer: Medicare Other | Admitting: Adult Health

## 2014-01-03 DIAGNOSIS — J0100 Acute maxillary sinusitis, unspecified: Secondary | ICD-10-CM

## 2014-01-03 DIAGNOSIS — H8110 Benign paroxysmal vertigo, unspecified ear: Secondary | ICD-10-CM | POA: Diagnosis not present

## 2014-01-04 DIAGNOSIS — I110 Hypertensive heart disease with heart failure: Secondary | ICD-10-CM | POA: Diagnosis not present

## 2014-01-04 LAB — CBC AND DIFFERENTIAL
HCT: 35 % — AB (ref 36–46)
Hemoglobin: 10.9 g/dL — AB (ref 12.0–16.0)
PLATELETS: 164 10*3/uL (ref 150–399)
WBC: 11.5 10^3/mL

## 2014-01-07 ENCOUNTER — Encounter: Payer: Self-pay | Admitting: Adult Health

## 2014-01-07 NOTE — Progress Notes (Signed)
Patient ID: [name], female   DOB: Nov 08, 1929, 80 y.o.   MRN: [id number]     [facility]  Allergies  Allergen Reactions  . Biphosphate   . Codeine     sick  . Morphine And Related Other (See Comments)    unknown  . Percocet [Oxycodone-Acetaminophen]     unknown  . Plavix [Clopidogrel Bisulfate] Other (See Comments)    unknown  . Sulfur Other (See Comments)    unknown     Chief Complaint  Patient presents with  . Acute Visit    vertigo     HPI:  She is having sinus infection symptoms present. She is also having vertigo and staff is concerned about orthostatics. She does not report fevers present. She does have congestion present with cough without sputum production present.   Past Medical History  Diagnosis Date  . SOB (shortness of breath)   . MI (myocardial infarction)   . Poor appetite   . Arthritis   . Fatigue   . Right ear pain   . Renal insufficiency   . RA (rheumatoid arthritis)   . PUD (peptic ulcer disease)   . GI bleed 2005  . GERD (gastroesophageal reflux disease)   . Hiatal hernia   . Hypothyroidism   . Osteoporosis   . Depression   . Anxiety   . Coronary artery disease   . CVA (cerebral vascular accident)   . TIA (transient ischemic attack)   . Fall   . DVT of lower extremity (deep venous thrombosis)   . HCAP (healthcare-associated pneumonia) 01/22/2013  . Enteritis due to Clostridium difficile 01/04/2013  . Sepsis 12/19/2012  . Recurrent colitis due to Clostridium difficile 06/02/2012    Past Surgical History  Procedure Laterality Date  . Nstemi  06/2010  . Spinal fusion surgery    . Knee surgery    . Cardiac catheterization      SHOWED RUPTURE PLAQUE IN THE LAD. THE LAD IS NONOBSTRUCTIVE WITH ONLY 30-40% STENOSIS    VITAL SIGNS BP 133/66  Pulse 84  Temp(Src) 97.1 F (36.2 C)  Ht 5\' 6"  (1.676 m)  Wt 154 lb (69.854 kg)  BMI 24.87 kg/m2  SpO2 98%   Patient's Medications  New Prescriptions   No medications on file  Previous  Medications   ALBUTEROL (PROVENTIL HFA;VENTOLIN HFA) 108 (90 BASE) MCG/ACT INHALER    Inhale 1 puff into the lungs every 6 (six) hours as needed for wheezing or shortness of breath.   AMLODIPINE (NORVASC) 10 MG TABLET    Take 1 tablet (10 mg total) by mouth daily.   ASPIRIN 81 MG CHEWABLE TABLET    Chew 81 mg by mouth daily.   CALCIUM CARBONATE (TUMS) 500 MG CHEWABLE TABLET    Chew 1 tablet (200 mg of elemental calcium total) by mouth 2 (two) times daily.   CHOLECALCIFEROL (VITAMIN D) 2000 UNITS CAPS    Take 1 capsule (2,000 Units total) by mouth daily.   CRANBERRY 200 MG CAPS    Take 2 capsules by mouth 2 (two) times daily.   DEXTROMETHORPHAN-GUAIFENESIN (MUCINEX DM) 30-600 MG PER 12 HR TABLET    Take 1 tablet by mouth 2 (two) times daily as needed for cough.   ESOMEPRAZOLE (NEXIUM) 20 MG CAPSULE    Take 20 mg by mouth daily.    FERROUS SULFATE 325 (65 FE) MG TABLET    Take 325 mg by mouth daily with breakfast.   FEXOFENADINE (ALLEGRA) 180 MG TABLET  Take 1 tablet (180 mg total) by mouth daily.   HYDROCODONE-ACETAMINOPHEN (NORCO/VICODIN) 5-325 MG PER TABLET    Take 1 tablet by mouth every 6 (six) hours as needed for moderate pain.   IPRATROPIUM (ATROVENT) 0.03 % NASAL SPRAY    Place 2 sprays into both nostrils every 12 (twelve) hours.   LEVOTHYROXINE (SYNTHROID, LEVOTHROID) 50 MCG TABLET    Take 50 mcg by mouth daily.    LORAZEPAM (ATIVAN) 0.5 MG TABLET    Take one tablet by mouth every night at bedtime for anxiety   METHOCARBAMOL (ROBAXIN) 500 MG TABLET    Take 500 mg by mouth at bedtime. And every 8 hours as needed   METOPROLOL (LOPRESSOR) 50 MG TABLET    Take 50 mg by mouth 2 (two) times daily.    MULTIPLE VITAMIN (MULTIVITAMIN) TABLET    Take 1 tablet by mouth daily.     OMEGA-3 FATTY ACIDS (FISH OIL) 1200 MG CAPS    Take 1,200 mg by mouth daily.   POLYETHYL GLYCOL-PROPYL GLYCOL (SYSTANE) 0.4-0.3 % SOLN    Apply 1 drop to eye 2 (two) times daily.   PREDNISONE (DELTASONE) 10 MG TABLET     Take 10 mg by mouth daily.   ROSUVASTATIN (CRESTOR) 20 MG TABLET    Take 20 mg by mouth every evening.    SACCHAROMYCES BOULARDII (FLORASTOR) 250 MG CAPSULE    Take 250 mg by mouth daily.   SERTRALINE (ZOLOFT) 50 MG TABLET    Take 50 mg by mouth every morning.  Modified Medications   No medications on file  Discontinued Medications   No medications on file    SIGNIFICANT DIAGNOSTIC EXAMS  06-10-13: chest x-ray: no acute cardiopulmonary disease  11-09-13: dexa; t score -3.9  11-10-13: mammogram; No mammographic evidence of malignancy.    LABS REVIEWED:   01-10-13; glucose 82; bun 33; creat 1.3; k+4.3; na++141  01-26-13: wbc 9.6; hgb 9.0; hct 28.8 ;mcv 90.9; plt 262; chol 97; ldl 28; trig 178; tsh 1.089  03-31-13: wbc 18.3; hgb 12.1; hct 39.0; mcv 91.3 plt 182; glucose 90; bun 28; creat 1.1;k+ 3.5;na++ 139 04-15-13: wbc 10.3; hgb 11.8; hct 37.9; mcv 90.7. plt 177 tsh 0.369 04-20-13: tsh 0.314 05-20-13: tsh 0.580 06-21-13: chol 169; ldl 87; trig 409 06-27-13: urine culture: p mirabilis: rocephin  07-06-13 tsh 0.83 07-13-13: stool for c-diff: neg  08-04-13: wbc 12; hgb 11.4; hct 33.2; mcv 87; plt 150; glucose 72; bun 25; creat 1.3; k+3.7; na++141; liver normal albumin 3.3 Vit N56: 213; folic >08;  12-02-76: liver normal albumin 3.0; chol 131; ldl 42; trig 245    Review of Systems  Constitutional: Positive for malaise/fatigue. Negative for fever.  HENT: Positive for congestion and ear pain. Negative for sore throat.        Has mild right ear pain has mild sinus congestion present   Eyes: Negative for blurred vision, double vision and discharge.  Respiratory: Positive for cough. Negative for sputum production.   Cardiovascular: Negative for chest pain and palpitations.  Gastrointestinal: Negative for heartburn, abdominal pain, diarrhea and constipation.  Musculoskeletal: Negative for joint pain and myalgias.  Skin: Negative.   Neurological: Positive for dizziness. Negative for focal  weakness and headaches.  Psychiatric/Behavioral: Negative for depression. The patient is not nervous/anxious.       Physical Exam  Constitutional: She is oriented to person, place, and time. She appears well-developed and well-nourished. No distress.  Neck: Neck supple. No JVD present. increased right ear wax present  has oropharyngeal redness present without exudate. Has mild sinus puffiness present  Cardiovascular: Normal rate, regular rhythm and intact distal pulses.   Respiratory: Effort normal and breath sounds normal. No respiratory distress. She has no wheezes.  GI: Soft. Bowel sounds are normal. She exhibits no distension. There is no tenderness.  Musculoskeletal: Normal range of motion. She exhibits no edema.  Neurological: She is alert and oriented to person, place, and time.  Skin: Skin is warm and dry. She is not diaphoretic.  Psychiatric: She has a normal mood and affect.     ASSESSMENT/ PLAN:  1. Acute sinusitis with vertigo: will obtain orthostatic vital signs daily; will begin her on a Z-pack; with florastor for one week for her oral thrush will give one dose of diflucan will check cbc in the am and will monitor her status.

## 2014-01-09 ENCOUNTER — Non-Acute Institutional Stay (SKILLED_NURSING_FACILITY): Payer: Medicare Other | Admitting: Adult Health

## 2014-01-09 ENCOUNTER — Encounter: Payer: Self-pay | Admitting: Adult Health

## 2014-01-09 DIAGNOSIS — M810 Age-related osteoporosis without current pathological fracture: Secondary | ICD-10-CM | POA: Diagnosis not present

## 2014-01-09 DIAGNOSIS — K219 Gastro-esophageal reflux disease without esophagitis: Secondary | ICD-10-CM

## 2014-01-09 DIAGNOSIS — H6123 Impacted cerumen, bilateral: Secondary | ICD-10-CM

## 2014-01-09 DIAGNOSIS — I639 Cerebral infarction, unspecified: Secondary | ICD-10-CM

## 2014-01-09 DIAGNOSIS — H6120 Impacted cerumen, unspecified ear: Secondary | ICD-10-CM | POA: Insufficient documentation

## 2014-01-09 DIAGNOSIS — I10 Essential (primary) hypertension: Secondary | ICD-10-CM | POA: Diagnosis not present

## 2014-01-09 DIAGNOSIS — I6350 Cerebral infarction due to unspecified occlusion or stenosis of unspecified cerebral artery: Secondary | ICD-10-CM

## 2014-01-09 DIAGNOSIS — M069 Rheumatoid arthritis, unspecified: Secondary | ICD-10-CM

## 2014-01-09 DIAGNOSIS — E038 Other specified hypothyroidism: Secondary | ICD-10-CM | POA: Diagnosis not present

## 2014-01-09 NOTE — Assessment & Plan Note (Addendum)
She has difficulty swallowing that is mild by her account. Will try increasing Nexium to 40mg  qd. She also has a hx of thyromegaly and a hiatal hernia. She is also on prednisone long term due to RA/adrenal insufficiency. These may be a contributing factors. Last MBS

## 2014-01-09 NOTE — Assessment & Plan Note (Addendum)
Intermittent complaints of pain. I will continue current dose of prednisone. Consider taper in the future due to hx of OP and side effects with prolonged use

## 2014-01-09 NOTE — Progress Notes (Signed)
Patient ID: [name], female   DOB: 1929-11-09, 80 y.o.   MRN: [id number]   [place] (31)  Code Status:  DNR  Chief Complaint  Patient presents with  . Medical Management of Chronic Issues    HPI: This is an 80 y.o. Female residing at [facility]. I am here to review her chronic conditions. She was treated last month for a sinus infection and this has resolved. She was treated last month for thrush with Diflucan and she continues to report a thick coat to her tongue. She reports difficulty swallowing but denies a cough or SOB. She also continues to report dizziness. Orthostatic BP's were ordered last week and they revealed some ortho static hypotension (see below).  She reports dizziness when standing or changes in position of her head. Other times she reports dizziness just lying in bed without movement. She also reports that her ears are "clogged up" and request debrox gtts. Her weight is stable at 154 lbs. She reports that she is eating and drinking well.    Allergies  Allergen Reactions  . Biphosphate   . Codeine     sick  . Morphine And Related Other (See Comments)    unknown  . Percocet [Oxycodone-Acetaminophen]     unknown  . Plavix [Clopidogrel Bisulfate] Other (See Comments)    unknown  . Sulfur Other (See Comments)    unknown   Outpatient Encounter Prescriptions as of 01/09/2014  Medication Sig  . meclizine (ANTIVERT) 25 MG tablet Take 25 mg by mouth 2 (two) times daily as needed for dizziness.  Marland Kitchen albuterol (PROVENTIL HFA;VENTOLIN HFA) 108 (90 BASE) MCG/ACT inhaler Inhale 1 puff into the lungs every 6 (six) hours as needed for wheezing or shortness of breath.  Marland Kitchen amLODipine (NORVASC) 10 MG tablet Take 1 tablet (10 mg total) by mouth daily.  Marland Kitchen aspirin 81 MG chewable tablet Chew 81 mg by mouth daily.  . calcium carbonate (TUMS) 500 MG chewable tablet Chew 1 tablet (200 mg of elemental calcium total) by mouth 2 (two) times daily.  .  Cholecalciferol (VITAMIN D) 2000 UNITS CAPS Take 1 capsule (2,000 Units total) by mouth daily.  . Cranberry 200 MG CAPS Take 2 capsules by mouth 2 (two) times daily.  Marland Kitchen dextromethorphan-guaiFENesin (MUCINEX DM) 30-600 MG per 12 hr tablet Take 1 tablet by mouth 2 (two) times daily as needed for cough.  . esomeprazole (NEXIUM) 20 MG capsule Take 20 mg by mouth daily.   . ferrous sulfate 325 (65 FE) MG tablet Take 325 mg by mouth daily with breakfast.  . fexofenadine (ALLEGRA) 180 MG tablet Take 1 tablet (180 mg total) by mouth daily.  Marland Kitchen HYDROcodone-acetaminophen (NORCO/VICODIN) 5-325 MG per tablet Take 1 tablet by mouth every 6 (six) hours as needed for moderate pain.  Marland Kitchen ipratropium (ATROVENT) 0.03 % nasal spray Place 2 sprays into both nostrils every 12 (twelve) hours.  Marland Kitchen levothyroxine (SYNTHROID, LEVOTHROID) 50 MCG tablet Take 50 mcg by mouth daily.   Marland Kitchen LORazepam (ATIVAN) 0.5 MG tablet Take one tablet by mouth every night at bedtime for anxiety  . methocarbamol (ROBAXIN) 500 MG tablet Take 500 mg by mouth at bedtime. And every 8 hours as needed  . metoprolol (LOPRESSOR) 50 MG tablet Take 50 mg by mouth 2 (two) times daily.   . Multiple Vitamin (MULTIVITAMIN) tablet Take 1 tablet by mouth daily.    . Omega-3 Fatty Acids (FISH OIL) 1200 MG CAPS Take 1,200 mg by mouth  daily.  . Polyethyl Glycol-Propyl Glycol (SYSTANE) 0.4-0.3 % SOLN Apply 1 drop to eye 2 (two) times daily.  . predniSONE (DELTASONE) 10 MG tablet Take 10 mg by mouth daily.  . rosuvastatin (CRESTOR) 20 MG tablet Take 20 mg by mouth every evening.   . saccharomyces boulardii (FLORASTOR) 250 MG capsule Take 250 mg by mouth daily.  . sertraline (ZOLOFT) 50 MG tablet Take 50 mg by mouth every morning.     DATA REVIEWED  Radiologic Exams 10/2013: DEXA scan t score -3.9 03/2013: MBS: Clinical impression: Mild oral and minimal pharyngeal dysphagia with sensorimotor components. Decreased oral coordination noted with pt extending head  upward to aid oral transit into pharynx. Pharyngeal deficits characterized by sensory impairments with liquids accumulating at pyriform sinus prior to swallow initiation. Pharyngeal swallow was strong with only minimal liquid residuals at vallecular space. No aspiration or penetration or significant stasis. 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    Laboratory Studies 01-10-13; glucose 82; bun 33; creat 1.3; k+4.3; na++141  01-26-13: wbc 9.6; hgb 9.0; hct 28.8 ;mcv 90.9; plt 262; chol 97; ldl 28; trig 178; tsh 1.089  03-31-13: wbc 18.3; hgb 12.1; hct 39.0; mcv 91.3 plt 182; glucose 90; bun 28; creat 1.1;k+ 3.5;na++ 139  04-15-13: wbc 10.3; hgb 11.8; hct 37.9; mcv 90.7. plt 177 tsh 0.369  04-20-13: tsh 0.314  05-20-13: tsh 0.580  06-21-13: chol 169; ldl 87; trig 409  06-27-13: urine culture: p mirabilis: rocephin  09/01/13: Folate >25, B12 545, WBC 12, Hgb 11.4, Hct 33.2, MCV-87, Plt-150, Na 141, K 3.7, Cl 106, Co2 26, glucose 72, BUN 25, Cr 1.3, Ca 8.8 10/03/13: total protein 5.2, alb 3.0, AST 18, ALT 20, LDL 42, TC 131, Tri 245, HDL 40 12/19/13: TSH 0.588   REVIEW OF SYSTEMS  DATA OBTAINED: from patient, nurse, medical record GENERAL: Feels well  No recent fever, fatigue, change in activity status, appetite, or weight  EARS: Denies pain or discharge. States they are clogged NOSE: denies drainage or congestion RESPIRATORY: No cough, wheezing, SOB at rest. SOB reported with activity CARDIAC: No chest pain, palpitations. Edema reported in the evening in her legs. GI: No abdominal pain  No Nausea,vomiting,diarrhea or constipation. Difficulty swallowing and reflux  MUSCULOSKELETAL: Joint pain in hands and shoulders due to RA. No back pain    No muscle ache, pain, weakness   Unsteady gait. Needs assistance with ambulation and getting to the [facility] NEUROLOGIC: Dizziness, see HPI. No change in mental status. PSYCHIATRIC: No feelings of anxiety, depression  Sleeps well    No behavior issue  PHYSICAL EXAM Filed Vitals:   01/09/14 1417  BP: 104/60  Pulse: 78  Temp: 97.1 F (36.2 C)  Resp: 20  Weight: 154 lb (69.854 kg)   Body mass index is 24.87 kg/(m^2). Orthostatic BP/pulse: lying 132/77, 90 sitting 112/62, 72, standing 88/62 82  GENERAL APPEARANCE: No acute distress, appropriately groomed, normal body habitus Alert, pleasant, conversant. SKIN: No diaphoresis, rash, wound NOSE: clear with no drainage or redness NECK: supple, no nodules, no lymphadenopathy, thyromegaly  EYES: PEERLA no drainage or redness Oropharynx: no drainage or redness HEAD: Normocephalic, atraumatic EYES: Conjunctiva/lids clear  RESPIRATORY: Breathing is even, unlabored  Lung sounds are clear and full  CARDIOVASCULAR: Heart RRR   No murmur or extra heart sounds   EDEMA: No peripheral edema   ABD: BS x4, non distended, non tender NEURO: CN 2-12 intact. Alert and oriented x3 PSYCHIATRIC: Mood and affect appropriate to  situation   ASSESSMENT/PLAN  Essential hypertension Orthostatic, decreased Lopressor to 25mg  BID and monitor VS qd. Continue ASA no side effects.  GERD (gastroesophageal reflux disease) She has difficulty swallowing that is mild by her account. Will try increasing Nexium to 40mg  qd. She also has a hx of thyromegaly and a hiatal hernia. She is also on prednisone long term due to RA/adrenal insufficiency. These may be a contributing factors. Last MBS   Osteoporosis She is on calcium with Vit D BID, will increase to TID. She is not able to take bisphosphonates. She is on long term prednisone. Her last T score was -3.9 in may of 2015.   Other specified hypothyroidism Stable continue current meds. TSH WNL  RA (rheumatoid arthritis) Intermittent complaints of pain. I will continue current dose of prednisone. Consider taper in the future due to hx of OP and side effects with prolonged use  Cerumen impaction Debrox 5 gtts OU daily for 1 week, then irrigate  ears.  Stroke Continue ASA, no new issues.    Bard Herbert NP/Christina Wert RN, MSN  01/09/2014

## 2014-01-09 NOTE — Assessment & Plan Note (Addendum)
Orthostatic, decreased Lopressor to 25mg  BID and monitor VS qd. Continue ASA no side effects.

## 2014-01-09 NOTE — Assessment & Plan Note (Signed)
Debrox 5 gtts OU daily for 1 week, then irrigate ears.

## 2014-01-09 NOTE — Assessment & Plan Note (Signed)
Stable continue current meds. TSH WNL

## 2014-01-09 NOTE — Assessment & Plan Note (Signed)
Continue ASA, no new issues.

## 2014-01-09 NOTE — Assessment & Plan Note (Addendum)
She is on calcium with Vit D BID, will increase to TID. She is not able to take bisphosphonates. She is on long term prednisone. Her last T score was -3.9 in may of 2015.

## 2014-01-10 DIAGNOSIS — D649 Anemia, unspecified: Secondary | ICD-10-CM | POA: Diagnosis not present

## 2014-01-10 DIAGNOSIS — E785 Hyperlipidemia, unspecified: Secondary | ICD-10-CM | POA: Diagnosis not present

## 2014-01-10 DIAGNOSIS — E875 Hyperkalemia: Secondary | ICD-10-CM | POA: Diagnosis not present

## 2014-01-10 LAB — BASIC METABOLIC PANEL
BUN: 32 mg/dL — AB (ref 4–21)
Creatinine: 1.4 mg/dL — AB (ref 0.5–1.1)
POTASSIUM: 4.5 mmol/L (ref 3.4–5.3)
Sodium: 140 mmol/L (ref 137–147)

## 2014-01-17 DIAGNOSIS — R262 Difficulty in walking, not elsewhere classified: Secondary | ICD-10-CM | POA: Diagnosis not present

## 2014-01-17 DIAGNOSIS — R279 Unspecified lack of coordination: Secondary | ICD-10-CM | POA: Diagnosis not present

## 2014-01-17 DIAGNOSIS — Z9181 History of falling: Secondary | ICD-10-CM | POA: Diagnosis not present

## 2014-01-17 DIAGNOSIS — M6281 Muscle weakness (generalized): Secondary | ICD-10-CM | POA: Diagnosis not present

## 2014-01-18 DIAGNOSIS — R262 Difficulty in walking, not elsewhere classified: Secondary | ICD-10-CM | POA: Diagnosis not present

## 2014-01-18 DIAGNOSIS — M6281 Muscle weakness (generalized): Secondary | ICD-10-CM | POA: Diagnosis not present

## 2014-01-18 DIAGNOSIS — Z9181 History of falling: Secondary | ICD-10-CM | POA: Diagnosis not present

## 2014-01-18 DIAGNOSIS — R279 Unspecified lack of coordination: Secondary | ICD-10-CM | POA: Diagnosis not present

## 2014-01-19 DIAGNOSIS — R279 Unspecified lack of coordination: Secondary | ICD-10-CM | POA: Diagnosis not present

## 2014-01-19 DIAGNOSIS — M6281 Muscle weakness (generalized): Secondary | ICD-10-CM | POA: Diagnosis not present

## 2014-01-19 DIAGNOSIS — Z9181 History of falling: Secondary | ICD-10-CM | POA: Diagnosis not present

## 2014-01-19 DIAGNOSIS — R262 Difficulty in walking, not elsewhere classified: Secondary | ICD-10-CM | POA: Diagnosis not present

## 2014-01-20 DIAGNOSIS — Z9181 History of falling: Secondary | ICD-10-CM | POA: Diagnosis not present

## 2014-01-20 DIAGNOSIS — R262 Difficulty in walking, not elsewhere classified: Secondary | ICD-10-CM | POA: Diagnosis not present

## 2014-01-20 DIAGNOSIS — R279 Unspecified lack of coordination: Secondary | ICD-10-CM | POA: Diagnosis not present

## 2014-01-20 DIAGNOSIS — M6281 Muscle weakness (generalized): Secondary | ICD-10-CM | POA: Diagnosis not present

## 2014-01-23 DIAGNOSIS — Z9181 History of falling: Secondary | ICD-10-CM | POA: Diagnosis not present

## 2014-01-23 DIAGNOSIS — M6281 Muscle weakness (generalized): Secondary | ICD-10-CM | POA: Diagnosis not present

## 2014-01-23 DIAGNOSIS — R262 Difficulty in walking, not elsewhere classified: Secondary | ICD-10-CM | POA: Diagnosis not present

## 2014-01-23 DIAGNOSIS — R279 Unspecified lack of coordination: Secondary | ICD-10-CM | POA: Diagnosis not present

## 2014-01-24 DIAGNOSIS — Z9181 History of falling: Secondary | ICD-10-CM | POA: Diagnosis not present

## 2014-01-24 DIAGNOSIS — R262 Difficulty in walking, not elsewhere classified: Secondary | ICD-10-CM | POA: Diagnosis not present

## 2014-01-24 DIAGNOSIS — R279 Unspecified lack of coordination: Secondary | ICD-10-CM | POA: Diagnosis not present

## 2014-01-24 DIAGNOSIS — M6281 Muscle weakness (generalized): Secondary | ICD-10-CM | POA: Diagnosis not present

## 2014-01-25 DIAGNOSIS — R262 Difficulty in walking, not elsewhere classified: Secondary | ICD-10-CM | POA: Diagnosis not present

## 2014-01-25 DIAGNOSIS — R279 Unspecified lack of coordination: Secondary | ICD-10-CM | POA: Diagnosis not present

## 2014-01-25 DIAGNOSIS — M6281 Muscle weakness (generalized): Secondary | ICD-10-CM | POA: Diagnosis not present

## 2014-01-25 DIAGNOSIS — Z9181 History of falling: Secondary | ICD-10-CM | POA: Diagnosis not present

## 2014-01-26 DIAGNOSIS — M6281 Muscle weakness (generalized): Secondary | ICD-10-CM | POA: Diagnosis not present

## 2014-01-26 DIAGNOSIS — Z9181 History of falling: Secondary | ICD-10-CM | POA: Diagnosis not present

## 2014-01-26 DIAGNOSIS — R262 Difficulty in walking, not elsewhere classified: Secondary | ICD-10-CM | POA: Diagnosis not present

## 2014-01-26 DIAGNOSIS — R279 Unspecified lack of coordination: Secondary | ICD-10-CM | POA: Diagnosis not present

## 2014-01-27 DIAGNOSIS — R262 Difficulty in walking, not elsewhere classified: Secondary | ICD-10-CM | POA: Diagnosis not present

## 2014-01-27 DIAGNOSIS — M6281 Muscle weakness (generalized): Secondary | ICD-10-CM | POA: Diagnosis not present

## 2014-01-27 DIAGNOSIS — Z9181 History of falling: Secondary | ICD-10-CM | POA: Diagnosis not present

## 2014-01-27 DIAGNOSIS — R279 Unspecified lack of coordination: Secondary | ICD-10-CM | POA: Diagnosis not present

## 2014-01-30 DIAGNOSIS — R262 Difficulty in walking, not elsewhere classified: Secondary | ICD-10-CM | POA: Diagnosis not present

## 2014-01-30 DIAGNOSIS — M6281 Muscle weakness (generalized): Secondary | ICD-10-CM | POA: Diagnosis not present

## 2014-01-30 DIAGNOSIS — M069 Rheumatoid arthritis, unspecified: Secondary | ICD-10-CM | POA: Diagnosis not present

## 2014-01-30 DIAGNOSIS — R279 Unspecified lack of coordination: Secondary | ICD-10-CM | POA: Diagnosis not present

## 2014-01-30 DIAGNOSIS — Z9181 History of falling: Secondary | ICD-10-CM | POA: Diagnosis not present

## 2014-01-31 DIAGNOSIS — M6281 Muscle weakness (generalized): Secondary | ICD-10-CM | POA: Diagnosis not present

## 2014-01-31 DIAGNOSIS — R279 Unspecified lack of coordination: Secondary | ICD-10-CM | POA: Diagnosis not present

## 2014-01-31 DIAGNOSIS — Z9181 History of falling: Secondary | ICD-10-CM | POA: Diagnosis not present

## 2014-01-31 DIAGNOSIS — R262 Difficulty in walking, not elsewhere classified: Secondary | ICD-10-CM | POA: Diagnosis not present

## 2014-01-31 DIAGNOSIS — M069 Rheumatoid arthritis, unspecified: Secondary | ICD-10-CM | POA: Diagnosis not present

## 2014-02-01 DIAGNOSIS — Z9181 History of falling: Secondary | ICD-10-CM | POA: Diagnosis not present

## 2014-02-01 DIAGNOSIS — R262 Difficulty in walking, not elsewhere classified: Secondary | ICD-10-CM | POA: Diagnosis not present

## 2014-02-01 DIAGNOSIS — M069 Rheumatoid arthritis, unspecified: Secondary | ICD-10-CM | POA: Diagnosis not present

## 2014-02-01 DIAGNOSIS — R279 Unspecified lack of coordination: Secondary | ICD-10-CM | POA: Diagnosis not present

## 2014-02-01 DIAGNOSIS — M6281 Muscle weakness (generalized): Secondary | ICD-10-CM | POA: Diagnosis not present

## 2014-02-02 DIAGNOSIS — R279 Unspecified lack of coordination: Secondary | ICD-10-CM | POA: Diagnosis not present

## 2014-02-02 DIAGNOSIS — M069 Rheumatoid arthritis, unspecified: Secondary | ICD-10-CM | POA: Diagnosis not present

## 2014-02-02 DIAGNOSIS — Z9181 History of falling: Secondary | ICD-10-CM | POA: Diagnosis not present

## 2014-02-02 DIAGNOSIS — R262 Difficulty in walking, not elsewhere classified: Secondary | ICD-10-CM | POA: Diagnosis not present

## 2014-02-02 DIAGNOSIS — M6281 Muscle weakness (generalized): Secondary | ICD-10-CM | POA: Diagnosis not present

## 2014-02-03 DIAGNOSIS — M6281 Muscle weakness (generalized): Secondary | ICD-10-CM | POA: Diagnosis not present

## 2014-02-03 DIAGNOSIS — R262 Difficulty in walking, not elsewhere classified: Secondary | ICD-10-CM | POA: Diagnosis not present

## 2014-02-03 DIAGNOSIS — Z9181 History of falling: Secondary | ICD-10-CM | POA: Diagnosis not present

## 2014-02-03 DIAGNOSIS — R279 Unspecified lack of coordination: Secondary | ICD-10-CM | POA: Diagnosis not present

## 2014-02-03 DIAGNOSIS — M069 Rheumatoid arthritis, unspecified: Secondary | ICD-10-CM | POA: Diagnosis not present

## 2014-02-06 DIAGNOSIS — M6281 Muscle weakness (generalized): Secondary | ICD-10-CM | POA: Diagnosis not present

## 2014-02-06 DIAGNOSIS — Z9181 History of falling: Secondary | ICD-10-CM | POA: Diagnosis not present

## 2014-02-06 DIAGNOSIS — R262 Difficulty in walking, not elsewhere classified: Secondary | ICD-10-CM | POA: Diagnosis not present

## 2014-02-06 DIAGNOSIS — R279 Unspecified lack of coordination: Secondary | ICD-10-CM | POA: Diagnosis not present

## 2014-02-06 DIAGNOSIS — M069 Rheumatoid arthritis, unspecified: Secondary | ICD-10-CM | POA: Diagnosis not present

## 2014-02-21 ENCOUNTER — Non-Acute Institutional Stay (SKILLED_NURSING_FACILITY): Payer: Medicare Other | Admitting: Adult Health

## 2014-02-21 DIAGNOSIS — I6350 Cerebral infarction due to unspecified occlusion or stenosis of unspecified cerebral artery: Secondary | ICD-10-CM

## 2014-02-21 DIAGNOSIS — I10 Essential (primary) hypertension: Secondary | ICD-10-CM | POA: Diagnosis not present

## 2014-02-21 DIAGNOSIS — M069 Rheumatoid arthritis, unspecified: Secondary | ICD-10-CM

## 2014-02-21 DIAGNOSIS — E038 Other specified hypothyroidism: Secondary | ICD-10-CM

## 2014-02-21 DIAGNOSIS — E2740 Unspecified adrenocortical insufficiency: Secondary | ICD-10-CM

## 2014-02-21 DIAGNOSIS — E2749 Other adrenocortical insufficiency: Secondary | ICD-10-CM | POA: Diagnosis not present

## 2014-02-21 DIAGNOSIS — I639 Cerebral infarction, unspecified: Secondary | ICD-10-CM

## 2014-02-21 DIAGNOSIS — M810 Age-related osteoporosis without current pathological fracture: Secondary | ICD-10-CM

## 2014-02-22 DIAGNOSIS — I219 Acute myocardial infarction, unspecified: Secondary | ICD-10-CM | POA: Diagnosis not present

## 2014-02-22 DIAGNOSIS — F329 Major depressive disorder, single episode, unspecified: Secondary | ICD-10-CM | POA: Diagnosis not present

## 2014-02-22 DIAGNOSIS — F3289 Other specified depressive episodes: Secondary | ICD-10-CM | POA: Diagnosis not present

## 2014-02-28 DIAGNOSIS — N390 Urinary tract infection, site not specified: Secondary | ICD-10-CM | POA: Diagnosis not present

## 2014-03-02 DIAGNOSIS — M26629 Arthralgia of temporomandibular joint, unspecified side: Secondary | ICD-10-CM | POA: Diagnosis not present

## 2014-03-02 DIAGNOSIS — H6120 Impacted cerumen, unspecified ear: Secondary | ICD-10-CM | POA: Diagnosis not present

## 2014-03-03 ENCOUNTER — Other Ambulatory Visit: Payer: Self-pay | Admitting: *Deleted

## 2014-03-03 MED ORDER — LORAZEPAM 0.5 MG PO TABS: ORAL_TABLET | ORAL | Status: DC

## 2014-03-03 NOTE — Telephone Encounter (Signed)
[facility] 

## 2014-03-05 ENCOUNTER — Encounter: Payer: Self-pay | Admitting: Adult Health

## 2014-03-05 NOTE — Progress Notes (Signed)
Patient ID: [name], female   DOB: 1930-05-21, 80 y.o.   MRN: [id number]     [facility]  Allergies  Allergen Reactions  . Biphosphate   . Codeine     sick  . Morphine And Related Other (See Comments)    unknown  . Percocet [Oxycodone-Acetaminophen]     unknown  . Plavix [Clopidogrel Bisulfate] Other (See Comments)    unknown  . Sulfur Other (See Comments)    unknown     Chief Complaint  Patient presents with  . Medical Management of Chronic Issues    HPI:  She is a long term resident being seen for the management of her chronic illnesses. She is complaining of left shoulder pain  which has gotten worse for the pain 2-3 days. She states that she has this pain occasionally but is bothering her more. There are no other concerns being voiced at this time. We have had a prolonged discussion regarding her pain management and at this time she does not want a short burst of prednisone.   Past Medical History  Diagnosis Date  . SOB (shortness of breath)   . MI (myocardial infarction)   . Poor appetite   . Arthritis   . Fatigue   . Right ear pain   . Renal insufficiency   . RA (rheumatoid arthritis)   . PUD (peptic ulcer disease)   . GI bleed 2005  . GERD (gastroesophageal reflux disease)   . Hiatal hernia   . Hypothyroidism   . Osteoporosis   . Depression   . Anxiety   . Coronary artery disease   . CVA (cerebral vascular accident)   . TIA (transient ischemic attack)   . Fall   . DVT of lower extremity (deep venous thrombosis)   . HCAP (healthcare-associated pneumonia) 01/22/2013  . Enteritis due to Clostridium difficile 01/04/2013  . Sepsis 12/19/2012  . Recurrent colitis due to Clostridium difficile 06/02/2012    Past Surgical History  Procedure Laterality Date  . Nstemi  06/2010  . Spinal fusion surgery    . Knee surgery    . Cardiac catheterization      SHOWED RUPTURE PLAQUE IN THE LAD. THE LAD IS NONOBSTRUCTIVE WITH ONLY 30-40% STENOSIS    VITAL  SIGNS BP 128/76  Pulse 54  Ht 5\' 6"  (1.676 m)  Wt 154 lb (69.854 kg)  BMI 24.87 kg/m2   Patient's Medications  New Prescriptions   No medications on file  Previous Medications   ALBUTEROL (PROVENTIL HFA;VENTOLIN HFA) 108 (90 BASE) MCG/ACT INHALER    Inhale 1 puff into the lungs every 6 (six) hours as needed for wheezing or shortness of breath.   AMLODIPINE (NORVASC) 10 MG TABLET    Take 1 tablet (10 mg total) by mouth daily.   ASPIRIN 81 MG CHEWABLE TABLET    Chew 81 mg by mouth daily.   CALCIUM CARBONATE (TUMS) 500 MG CHEWABLE TABLET    Chew 1 tablet (200 mg of elemental calcium total) by mouth 2 (two) times daily.   CHOLECALCIFEROL (VITAMIN D) 2000 UNITS CAPS    Take 1 capsule (2,000 Units total) by mouth daily.   CRANBERRY 200 MG CAPS    Take 2 capsules by mouth 2 (two) times daily.   DEXTROMETHORPHAN-GUAIFENESIN (MUCINEX DM) 30-600 MG PER 12 HR TABLET    Take 1 tablet by mouth 2 (two) times daily as needed for cough.   ESOMEPRAZOLE (NEXIUM) 20 MG CAPSULE    Take 20 mg by  mouth daily.    FERROUS SULFATE 325 (65 FE) MG TABLET    Take 325 mg by mouth daily with breakfast.   FEXOFENADINE (ALLEGRA) 180 MG TABLET    Take 1 tablet (180 mg total) by mouth daily.   HYDROCODONE-ACETAMINOPHEN (NORCO/VICODIN) 5-325 MG PER TABLET    Take 1 tablet by mouth every 6 (six) hours as needed for moderate pain.   IPRATROPIUM (ATROVENT) 0.03 % NASAL SPRAY    Place 2 sprays into both nostrils every 12 (twelve) hours.   LEVOTHYROXINE (SYNTHROID, LEVOTHROID) 50 MCG TABLET    Take 50 mcg by mouth daily.    LORAZEPAM (ATIVAN) 0.5 MG TABLET    Take one tablet by mouth every night at bedtime for anxiety   MECLIZINE (ANTIVERT) 25 MG TABLET    Take 25 mg by mouth 2 (two) times daily as needed for dizziness.   METHOCARBAMOL (ROBAXIN) 500 MG TABLET    Take 500 mg by mouth at bedtime.    METOPROLOL (LOPRESSOR) 50 MG TABLET    Take 50 mg by mouth 2 (two) times daily.    MULTIPLE VITAMIN (MULTIVITAMIN) TABLET    Take 1  tablet by mouth daily.     OMEGA-3 FATTY ACIDS (FISH OIL) 1200 MG CAPS    Take 1,200 mg by mouth daily.   POLYETHYL GLYCOL-PROPYL GLYCOL (SYSTANE) 0.4-0.3 % SOLN    Apply 1 drop to eye 2 (two) times daily.   PREDNISONE (DELTASONE) 10 MG TABLET    Take 10 mg by mouth daily.   ROSUVASTATIN (CRESTOR) 20 MG TABLET    Take 20 mg by mouth every evening.    SACCHAROMYCES BOULARDII (FLORASTOR) 250 MG CAPSULE    Take 250 mg by mouth daily.   SERTRALINE (ZOLOFT) 50 MG TABLET    Take 50 mg by mouth every morning.  Modified Medications   No medications on file  Discontinued Medications   No medications on file    SIGNIFICANT DIAGNOSTIC EXAMS  06-10-13: chest x-ray: no acute cardiopulmonary disease  11-09-13: dexa; t score -3.9  11-10-13: mammogram; No mammographic evidence of malignancy.    LABS REVIEWED:   03-31-13: wbc 18.3; hgb 12.1; hct 39.0; mcv 91.3 plt 182; glucose 90; bun 28; creat 1.1;k+ 3.5 ;na++ 139  04-15-13: wbc 10.3; hgb 11.8; hct 37.9; mcv 90.7. plt 177 tsh 0.369  04-20-13: tsh 0.314  05-20-13: tsh 0.580  06-21-13: chol 169; ldl 87; trig 409  06-27-13: urine culture: p mirabilis: rocephin  09-01-13: wbc 12; hgb 11.4; hct 33.2; mcv 87; plt 150; glucose 72; bun 25; creat 1.3; k+3.7; na++141; folate >25; vit b12: 545  10-03-13: chol 131; ldl 42; trig 245; liver normal albumin 3.0  11-23-13: urine culture: no growth 12-19-13: tsh 0.588  01-04-14: wbc 11.5; hgb 10.9; hct 34.9; mcv 91.1; plt 164      Review of Systems  Constitutional: Positive for malaise/fatigue. Negative for fever.    Eyes: Negative for blurred vision, double vision and discharge.  Respiratory:negative for cough . Negative for sputum production.   Cardiovascular: Negative for chest pain and palpitations.  Gastrointestinal: Negative for heartburn, abdominal pain, diarrhea and constipation.  Musculoskeletal: left shoulder pain  Skin: Negative.   Neurological:Negative for focal weakness and headaches.    Psychiatric/Behavioral: Negative for depression. The patient is not nervous/anxious.       Physical Exam  Constitutional: She is oriented to person, place, and time. She appears well-developed and well-nourished. No distress.  Neck: Neck supple. No JVD present. Cardiovascular: Normal  rate, regular rhythm and intact distal pulses.   Respiratory: Effort normal and breath sounds normal. No respiratory distress. She has no wheezes.  GI: Soft. Bowel sounds are normal. She exhibits no distension. There is no tenderness.  Musculoskeletal: Normal range of motion. She exhibits no edema.  Neurological: She is alert and oriented to person, place, and time.  Skin: Skin is warm and dry. She is not diaphoretic.  Psychiatric: She has a normal mood and affect.    ASSESSMENT/ PLAN:   1. Dyslipidemia: will continue her crestor 20 mg daily and fish oil 1200 mg daily   2. Hypertension: will continue lopressor 50 mg twice daily  norvac 10 mg daily ans asa 81 mg daily and will monitor   3. Jerrye Bushy: will continue nexium 20 mg  daily is on long term florastor for her history of c-diff colitis.   4. Anemia; will continue iron daily   5. Adrenal insuffiency: is stable will continue prednisone 10 mg daily and will monitor   6. Allergic rhinitis: will continue allegra 180 mg daily and atrovent nasal spray twice daily   7. Depression and anxiety she is emotionally stable will continue zoloft 50 mg daily and ativan 0.5 mg nightly for anxiety and will monitor her status   8. RA: she is unchanged  her pain is stable prednisone 10 mg daily has vicodin 5/325 mg every 6 hours as needed for pain and robaxin 500 mg nightly and every 8 hours as needed  and will monitor her status.  She is going to consider a short coarse of increased prednisone to help with her shoulder pain.   9. Old cva: she is neurology intact and stable will continue asa 81 mg daily   10. Osteoporosis: will continue ca++ 500 mg twice daily and  vit d 2000 units daily

## 2014-03-20 ENCOUNTER — Non-Acute Institutional Stay (SKILLED_NURSING_FACILITY): Payer: Medicare Other | Admitting: Adult Health

## 2014-03-20 DIAGNOSIS — J209 Acute bronchitis, unspecified: Secondary | ICD-10-CM | POA: Diagnosis not present

## 2014-03-20 DIAGNOSIS — E785 Hyperlipidemia, unspecified: Secondary | ICD-10-CM

## 2014-03-20 DIAGNOSIS — M069 Rheumatoid arthritis, unspecified: Secondary | ICD-10-CM

## 2014-03-20 DIAGNOSIS — E2749 Other adrenocortical insufficiency: Secondary | ICD-10-CM

## 2014-03-20 DIAGNOSIS — E2740 Unspecified adrenocortical insufficiency: Secondary | ICD-10-CM

## 2014-03-20 DIAGNOSIS — D531 Other megaloblastic anemias, not elsewhere classified: Secondary | ICD-10-CM | POA: Diagnosis not present

## 2014-03-20 DIAGNOSIS — K219 Gastro-esophageal reflux disease without esophagitis: Secondary | ICD-10-CM

## 2014-03-20 DIAGNOSIS — J309 Allergic rhinitis, unspecified: Secondary | ICD-10-CM

## 2014-03-20 DIAGNOSIS — M810 Age-related osteoporosis without current pathological fracture: Secondary | ICD-10-CM

## 2014-03-20 DIAGNOSIS — I639 Cerebral infarction, unspecified: Secondary | ICD-10-CM

## 2014-03-20 DIAGNOSIS — I6350 Cerebral infarction due to unspecified occlusion or stenosis of unspecified cerebral artery: Secondary | ICD-10-CM | POA: Diagnosis not present

## 2014-03-20 DIAGNOSIS — I10 Essential (primary) hypertension: Secondary | ICD-10-CM | POA: Diagnosis not present

## 2014-03-22 ENCOUNTER — Encounter: Payer: Self-pay | Admitting: Adult Health

## 2014-03-22 DIAGNOSIS — J209 Acute bronchitis, unspecified: Secondary | ICD-10-CM | POA: Insufficient documentation

## 2014-03-22 MED ORDER — ESOMEPRAZOLE MAGNESIUM 20 MG PO CPDR: 40.0000 mg | DELAYED_RELEASE_CAPSULE | Freq: Two times a day (BID) | ORAL | Status: DC

## 2014-03-22 NOTE — Progress Notes (Signed)
Patient ID: [name], female   DOB: 1930/04/17, 80 y.o.   MRN: [id number]     [facility]  Allergies  Allergen Reactions  . Biphosphate   . Codeine     sick  . Morphine And Related Other (See Comments)    unknown  . Percocet [Oxycodone-Acetaminophen]     unknown  . Plavix [Clopidogrel Bisulfate] Other (See Comments)    unknown  . Sulfur Other (See Comments)    unknown     Chief Complaint  Patient presents with  . Medical Management of Chronic Issues  . Acute Visit    congested cough    HPI:  She is a long term resident of this facility being seen for the management of her chronic illnesses. She is complaining of a congested cough; with sputum that she cannot cough up. She has increased shortness of breath present and has had wheezing present. She has increased fatigue present. She has had these symptoms for the past 5 days. There are no reports of fever present.    Past Medical History  Diagnosis Date  . SOB (shortness of breath)   . MI (myocardial infarction)   . Poor appetite   . Arthritis   . Fatigue   . Right ear pain   . Renal insufficiency   . RA (rheumatoid arthritis)   . PUD (peptic ulcer disease)   . GI bleed 2005  . GERD (gastroesophageal reflux disease)   . Hiatal hernia   . Hypothyroidism   . Osteoporosis   . Depression   . Anxiety   . Coronary artery disease   . CVA (cerebral vascular accident)   . TIA (transient ischemic attack)   . Fall   . DVT of lower extremity (deep venous thrombosis)   . HCAP (healthcare-associated pneumonia) 01/22/2013  . Enteritis due to Clostridium difficile 01/04/2013  . Sepsis 12/19/2012  . Recurrent colitis due to Clostridium difficile 06/02/2012    Past Surgical History  Procedure Laterality Date  . Nstemi  06/2010  . Spinal fusion surgery    . Knee surgery    . Cardiac catheterization      SHOWED RUPTURE PLAQUE IN THE LAD. THE LAD IS NONOBSTRUCTIVE WITH ONLY 30-40% STENOSIS    VITAL SIGNS BP 128/78   Pulse 74  Ht 5\' 6"  (1.676 m)  Wt 155 lb 12.8 oz (70.67 kg)  BMI 25.16 kg/m2   Patient's Medications  New Prescriptions   No medications on file  Previous Medications   ALBUTEROL (PROVENTIL HFA;VENTOLIN HFA) 108 (90 BASE) MCG/ACT INHALER    Inhale 1 puff into the lungs every 6 (six) hours as needed for wheezing or shortness of breath.   AMLODIPINE (NORVASC) 10 MG TABLET    Take 1 tablet (10 mg total) by mouth daily.   ASPIRIN 81 MG CHEWABLE TABLET    Chew 81 mg by mouth daily.   CALCIUM CARBONATE (TUMS) 500 MG CHEWABLE TABLET    Chew 1 tablet (200 mg of elemental calcium total) by mouth 2 (two) times daily.   CHOLECALCIFEROL (VITAMIN D) 2000 UNITS CAPS    Take 1 capsule (2,000 Units total) by mouth daily.   CRANBERRY 200 MG CAPS    Take 2 capsules by mouth 2 (two) times daily.   DEXTROMETHORPHAN-GUAIFENESIN (MUCINEX DM) 30-600 MG PER 12 HR TABLET    Take 1 tablet by mouth 2 (two) times daily as needed for cough.   ESOMEPRAZOLE (NEXIUM) 20 MG CAPSULE    Take 40 mg by  mouth daily.    FERROUS SULFATE 325 (65 FE) MG TABLET    Take 325 mg by mouth daily with breakfast.   FEXOFENADINE (ALLEGRA) 180 MG TABLET    Take 1 tablet (180 mg total) by mouth daily.   HYDROCODONE-ACETAMINOPHEN (NORCO/VICODIN) 5-325 MG PER TABLET    Take 1 tablet by mouth every 6 (six) hours as needed for moderate pain.   IPRATROPIUM (ATROVENT) 0.03 % NASAL SPRAY    Place 2 sprays into both nostrils every 12 (twelve) hours.   LEVOTHYROXINE (SYNTHROID, LEVOTHROID) 50 MCG TABLET    Take 50 mcg by mouth daily.    LORAZEPAM (ATIVAN) 0.5 MG TABLET    Take one tablet by mouth every night at bedtime for anxiety   MECLIZINE (ANTIVERT) 25 MG TABLET    Take 25 mg by mouth 2 (two) times daily as needed for dizziness.   METHOCARBAMOL (ROBAXIN) 500 MG TABLET    Take 500 mg by mouth at bedtime.    METOPROLOL (LOPRESSOR) 50 MG TABLET    Take 50 mg by mouth 2 (two) times daily.    MULTIPLE VITAMIN (MULTIVITAMIN) TABLET    Take 1 tablet by  mouth daily.     OMEGA-3 FATTY ACIDS (FISH OIL) 1200 MG CAPS    Take 1,200 mg by mouth daily.   POLYETHYL GLYCOL-PROPYL GLYCOL (SYSTANE) 0.4-0.3 % SOLN    Apply 1 drop to eye 2 (two) times daily.   PREDNISONE (DELTASONE) 10 MG TABLET    Take 10 mg by mouth daily.   ROSUVASTATIN (CRESTOR) 20 MG TABLET    Take 20 mg by mouth every evening.    SACCHAROMYCES BOULARDII (FLORASTOR) 250 MG CAPSULE    Take 250 mg by mouth daily.   SERTRALINE (ZOLOFT) 50 MG TABLET    Take 50 mg by mouth every morning.  Modified Medications   No medications on file  Discontinued Medications   No medications on file    SIGNIFICANT DIAGNOSTIC EXAMS  06-10-13: chest x-ray: no acute cardiopulmonary disease  11-09-13: dexa; t score -3.9  11-10-13: mammogram; No mammographic evidence of malignancy.    LABS REVIEWED:   03-31-13: wbc 18.3; hgb 12.1; hct 39.0; mcv 91.3 plt 182; glucose 90; bun 28; creat 1.1;k+ 3.5 ;na++ 139  04-15-13: wbc 10.3; hgb 11.8; hct 37.9; mcv 90.7. plt 177 tsh 0.369  04-20-13: tsh 0.314  05-20-13: tsh 0.580  06-21-13: chol 169; ldl 87; trig 409  06-27-13: urine culture: p mirabilis: rocephin  09-01-13: wbc 12; hgb 11.4; hct 33.2; mcv 87; plt 150; glucose 72; bun 25; creat 1.3; k+3.7; na++141; folate >25; vit b12: 545  10-03-13: chol 131; ldl 42; trig 245; liver normal albumin 3.0  11-23-13: urine culture: no growth 12-19-13: tsh 0.588  01-04-14: wbc 11.5; hgb 10.9; hct 34.9; mcv 91.1; plt 164      Review of Systems  Constitutional: Positive for malaise/fatigue. Negative for fever.    Eyes: Negative for blurred vision, double vision and discharge.  Respiratory:has cough; congestion; no sputum production    Cardiovascular: Negative for chest pain and palpitations.  Gastrointestinal: has cough with increased heart burn, no abdominal pain, diarrhea and constipation.  Musculoskeletal: no joint of muscle pain  Skin: Negative.   Neurological:Negative for focal weakness and headaches.    Psychiatric/Behavioral: Negative for depression. The patient is not nervous/anxious.       Physical Exam  Constitutional: She is oriented to person, place, and time. She appears well-developed and well-nourished. No distress.  Neck: Neck supple.  No JVD present. Cardiovascular: Normal rate, regular rhythm and intact distal pulses.   Respiratory: Effort normal and breath sounds normal. No respiratory distress. She has no wheezes.  GI: Soft. Bowel sounds are normal. She exhibits no distension. There is no tenderness.  Musculoskeletal: Normal range of motion. She exhibits no edema.  Neurological: She is alert and oriented to person, place, and time.  Skin: Skin is warm and dry. She is not diaphoretic.  Psychiatric: She has a normal mood and affect.    ASSESSMENT/ PLAN:   1. Dyslipidemia: will continue her crestor 20 mg daily and fish oil 1200 mg daily  Her last ldl was 42 and trig 245  2. Hypertension: will continue lopressor 50 mg twice daily  norvac 10 mg daily ans asa 81 mg daily and will monitor   3. Gerd: is worse will increase her nexium to 40 mg twice daily is on long term florastor for her history of c-diff colitis.   4. Anemia; will continue iron daily   5. Adrenal insuffiency: is stable will continue prednisone 10 mg daily and will monitor   6. Allergic rhinitis: will continue allegra 180 mg daily and atrovent nasal spray twice daily   7. Depression and anxiety she is emotionally stable will continue zoloft 50 mg daily and ativan 0.5 mg nightly for anxiety and will monitor her status   8. RA: she is unchanged  her pain is stable prednisone 10 mg daily has vicodin 5/325 mg every 6 hours as needed for pain and robaxin 500 mg nightly   and will monitor her status.    9. Old cva: she is neurology intact and stable will continue asa 81 mg daily   10. Osteoporosis: will continue ca++ 500 mg twice daily and vit d 2000 units daily   11. Acute bronchitis: will being levequin  500 mg twice daily for 14 days with florastor twice daily for 14 days; will do a short increase of her prednisone to 40 mg daily for 5 days then return to her base dose of 10 mg daily; will begin advair 10/50 twice daily for one month only and will continue to monitor her status.           Ok Edwards NP [facility]  Contact [phone] Monday through Friday 8am- 5pm  After hours call [phone]

## 2014-03-27 DIAGNOSIS — Z961 Presence of intraocular lens: Secondary | ICD-10-CM | POA: Diagnosis not present

## 2014-03-27 DIAGNOSIS — IMO0002 Reserved for concepts with insufficient information to code with codable children: Secondary | ICD-10-CM | POA: Diagnosis not present

## 2014-03-27 DIAGNOSIS — H04129 Dry eye syndrome of unspecified lacrimal gland: Secondary | ICD-10-CM | POA: Diagnosis not present

## 2014-04-12 DIAGNOSIS — M79674 Pain in right toe(s): Secondary | ICD-10-CM | POA: Diagnosis not present

## 2014-04-12 DIAGNOSIS — B351 Tinea unguium: Secondary | ICD-10-CM | POA: Diagnosis not present

## 2014-04-12 DIAGNOSIS — M79675 Pain in left toe(s): Secondary | ICD-10-CM | POA: Diagnosis not present

## 2014-04-13 ENCOUNTER — Non-Acute Institutional Stay (SKILLED_NURSING_FACILITY): Payer: Medicare Other | Admitting: Adult Health

## 2014-04-13 DIAGNOSIS — K649 Unspecified hemorrhoids: Secondary | ICD-10-CM | POA: Diagnosis not present

## 2014-04-13 DIAGNOSIS — J309 Allergic rhinitis, unspecified: Secondary | ICD-10-CM | POA: Diagnosis not present

## 2014-04-13 DIAGNOSIS — K219 Gastro-esophageal reflux disease without esophagitis: Secondary | ICD-10-CM | POA: Diagnosis not present

## 2014-04-14 LAB — CBC AND DIFFERENTIAL
HEMATOCRIT: 35 % — AB (ref 36–46)
Hemoglobin: 11.3 g/dL — AB (ref 12.0–16.0)
PLATELETS: 173 10*3/uL (ref 150–399)
WBC: 13.9 10*3/mL

## 2014-04-17 ENCOUNTER — Non-Acute Institutional Stay (SKILLED_NURSING_FACILITY): Payer: Medicare Other | Admitting: Adult Health

## 2014-04-17 ENCOUNTER — Encounter: Payer: Self-pay | Admitting: Adult Health

## 2014-04-17 DIAGNOSIS — K219 Gastro-esophageal reflux disease without esophagitis: Secondary | ICD-10-CM

## 2014-04-17 DIAGNOSIS — M069 Rheumatoid arthritis, unspecified: Secondary | ICD-10-CM | POA: Diagnosis not present

## 2014-04-17 DIAGNOSIS — I10 Essential (primary) hypertension: Secondary | ICD-10-CM

## 2014-04-17 DIAGNOSIS — F329 Major depressive disorder, single episode, unspecified: Secondary | ICD-10-CM

## 2014-04-17 DIAGNOSIS — E039 Hypothyroidism, unspecified: Secondary | ICD-10-CM

## 2014-04-17 DIAGNOSIS — E2740 Unspecified adrenocortical insufficiency: Secondary | ICD-10-CM | POA: Diagnosis not present

## 2014-04-17 DIAGNOSIS — R0609 Other forms of dyspnea: Secondary | ICD-10-CM

## 2014-04-17 DIAGNOSIS — J309 Allergic rhinitis, unspecified: Secondary | ICD-10-CM

## 2014-04-17 DIAGNOSIS — D649 Anemia, unspecified: Secondary | ICD-10-CM

## 2014-04-17 DIAGNOSIS — F32A Depression, unspecified: Secondary | ICD-10-CM

## 2014-04-17 DIAGNOSIS — R42 Dizziness and giddiness: Secondary | ICD-10-CM

## 2014-04-17 NOTE — Progress Notes (Signed)
Patient ID: [name], female   DOB: 1930-03-13, 80 y.o.   MRN: [id number]     Location: [facility] Code Status: Full Code  Allergies  Allergen Reactions  . Biphosphate   . Codeine     sick  . Morphine And Related Other (See Comments)    unknown  . Percocet [Oxycodone-Acetaminophen]     unknown  . Plavix [Clopidogrel Bisulfate] Other (See Comments)    unknown  . Sulfur Other (See Comments)    unknown     Chief Complaint  Patient presents with  . Medical Management of Chronic Issues    HLD, HTN, GERD, anemia, adrenal insufficiency, RA, old CVA, osteoporosis    HPI:  80 y.o. female is being seen for a routine visit. She is complaining dizziness and thinks it is from anemia and wants to know if we can check her lab. She also reports dyspnea and nausea occasionally. Weight stable but her appetite has not been too good. Otherwise, no other concerns reported. She participates in therapy. No falls reported. No skin issues. No other concerns reported from nursing staff.   Review of Systems  Constitutional: Positive for malaise/fatigue. Negative for fever.    Eyes: Negative for blurred vision, double vision and discharge.  Respiratory:Postive for dyspnea. No wheezes   Cardiovascular: Negative for chest pain and palpitations.  Gastrointestinal: Positive for decreased in appetite. no abdominal pain, diarrhea and constipation. No bloody stool.  GU: no hematuria  Musculoskeletal: no joint of muscle pain  Skin: Negative for rash or wounds Neurological:Negative for focal weakness and headaches.  Psychiatric/Behavioral: Negative for depression. The patient is not nervous/anxious.    Past Medical History  Diagnosis Date  . SOB (shortness of breath)   . MI (myocardial infarction)   . Poor appetite   . Arthritis   . Fatigue   . Right ear pain   . Renal insufficiency   . RA (rheumatoid arthritis)   . PUD (peptic ulcer disease)   . GI bleed 2005  . GERD  (gastroesophageal reflux disease)   . Hiatal hernia   . Hypothyroidism   . Osteoporosis   . Depression   . Anxiety   . Coronary artery disease   . CVA (cerebral vascular accident)   . TIA (transient ischemic attack)   . Fall   . DVT of lower extremity (deep venous thrombosis)   . HCAP (healthcare-associated pneumonia) 01/22/2013  . Enteritis due to Clostridium difficile 01/04/2013  . Sepsis 12/19/2012  . Recurrent colitis due to Clostridium difficile 06/02/2012    Past Surgical History  Procedure Laterality Date  . Nstemi  06/2010  . Spinal fusion surgery    . Knee surgery    . Cardiac catheterization      SHOWED RUPTURE PLAQUE IN THE LAD. THE LAD IS NONOBSTRUCTIVE WITH ONLY 30-40% STENOSIS      Medication List       This list is accurate as of: 04/17/14  6:25 PM.  Always use your most recent med list.               albuterol 108 (90 BASE) MCG/ACT inhaler  Commonly known as:  PROVENTIL HFA;VENTOLIN HFA  Inhale 1 puff into the lungs every 6 (six) hours as needed for wheezing or shortness of breath.     amLODipine 10 MG tablet  Commonly known as:  NORVASC  Take 1 tablet (10 mg total) by mouth daily.     aspirin 81 MG chewable tablet  Chew  81 mg by mouth daily.     calcium carbonate 500 MG chewable tablet  Commonly known as:  TUMS  Chew 1 tablet (200 mg of elemental calcium total) by mouth 2 (two) times daily.     cetirizine 10 MG tablet  Commonly known as:  ZYRTEC  Take 10 mg by mouth daily.     Cranberry 200 MG Caps  Take 2 capsules by mouth 2 (two) times daily.     dextromethorphan-guaiFENesin 30-600 MG per 12 hr tablet  Commonly known as:  MUCINEX DM  Take 1 tablet by mouth 2 (two) times daily as needed for cough.     esomeprazole 20 MG capsule  Commonly known as:  NEXIUM  Take 2 capsules (40 mg total) by mouth 2 (two) times daily before a meal.     ferrous sulfate 325 (65 FE) MG tablet  Take 325 mg by mouth daily with breakfast.     Fish Oil 1200 MG  Caps  Take 1,200 mg by mouth daily.     HYDROcodone-acetaminophen 5-325 MG per tablet  Commonly known as:  NORCO/VICODIN  Take 1 tablet by mouth every 6 (six) hours as needed for moderate pain.     ipratropium 0.03 % nasal spray  Commonly known as:  ATROVENT  Place 2 sprays into both nostrils every 12 (twelve) hours.     levothyroxine 50 MCG tablet  Commonly known as:  SYNTHROID, LEVOTHROID  Take 50 mcg by mouth daily.     LORazepam 0.5 MG tablet  Commonly known as:  ATIVAN  Take one tablet by mouth every night at bedtime for anxiety     meclizine 25 MG tablet  Commonly known as:  ANTIVERT  Take 25 mg by mouth 2 (two) times daily as needed for dizziness.     methocarbamol 500 MG tablet  Commonly known as:  ROBAXIN  Take 500 mg by mouth at bedtime.     metoprolol 50 MG tablet  Commonly known as:  LOPRESSOR  Take 50 mg by mouth 2 (two) times daily.     multivitamin tablet  Take 1 tablet by mouth daily.     predniSONE 10 MG tablet  Commonly known as:  DELTASONE  Take 10 mg by mouth daily.     rosuvastatin 20 MG tablet  Commonly known as:  CRESTOR  Take 20 mg by mouth every evening.     saccharomyces boulardii 250 MG capsule  Commonly known as:  FLORASTOR  Take 250 mg by mouth daily.     sertraline 50 MG tablet  Commonly known as:  ZOLOFT  Take 50 mg by mouth every morning.     SYSTANE 0.4-0.3 % Soln  Generic drug:  Polyethyl Glycol-Propyl Glycol  Apply 1 drop to eye 2 (two) times daily.     Vitamin D 2000 UNITS Caps  Take 1 capsule (2,000 Units total) by mouth daily.        Physical Exam   Filed Vitals:   04/17/14 1816  BP: 117/61  Pulse: 86  Temp: 97.3 F (36.3 C)  Resp: 18   Constitutional: WDWN elderly female in no acute distress.  HEENT: normocephalic, atraumatic. PERRL, EOM intact. Pale conjunctiva bilaterally. No icterus. No nasal discharge.  Neck: Neck supple. No JVD present. Cardiovascular: Normal rate, regular rhythm. S1, S2, no murmurs,  rubs, or gallops. Intact distal pulses.   Respiratory: Effort normal and breath sounds normal. No respiratory distress. She has no wheezes.  GI: Soft. Bowel sounds are normal.  She exhibits no distension. There is no tenderness.  Musculoskeletal: Normal range of motion. She exhibits no edema.  Neurological: She is alert and oriented to person, place, and time.  Skin: Skin is warm and dry. She is not diaphoretic.  Psychiatric: She has a normal mood and affect.    SIGNIFICANT DIAGNOSTIC EXAMS  06-10-13: chest x-ray: no acute cardiopulmonary disease  11-09-13: dexa; t score -3.9  11-10-13: mammogram; No mammographic evidence of malignancy.      LABS REVIEWED:   04-20-13: tsh 0.314  05-20-13: tsh 0.580  06-21-13: chol 169; ldl 87; trig 409  06-27-13: urine culture: p mirabilis: rocephin  09-01-13: wbc 12; hgb 11.4; hct 33.2; mcv 87; plt 150; glucose 72; bun 25; creat 1.3; k+3.7; na++141; folate >25; vit b12: 545  10-03-13: chol 131; ldl 42; trig 245; liver normal albumin 3.0  11-23-13: urine culture: no growth 12-19-13: tsh 0.588  01-04-14: wbc 11.5; hgb 10.9; hct 34.9; mcv 91.1; plt 164  01-10-14: wbc 12.4; hgb 11.2; hct 35.9; mcv 91.6; plt 140; glucose 84; bun 32; creat 1.4; k+4.5; na+= 140; liver normal albumin 3.3; chol 135; ldl 45; trig 225; tsh 0.559;   ASSESSMENT/ PLAN:  1. Dyslipidemia: Stable-will continue her crestor 20 mg daily and fish oil 1200 mg daily   2. Hypertension: stable. Will continue lopressor 25 mg twice daily  norvac 10 mg daily ans asa 81 mg daily and will monitor   3. GERD: stable. Continue Nexium 20 mg  twice daily and long term Florastor for her history of c-diff colitis.   4. Anemia: will continue ferrous sulfate 325mg  daily. Monitor for signs of bleeding.   5. Adrenal insuffiency: stable-willcontinue prednisone 10 mg daily and will monitor   6. Allergic rhinitis: stable-continue zyrtec 10mg  daily and atrovent nasal spray twice daily   7. Depression and  anxiety: stable-will continue zoloft 50 mg daily and ativan 0.5 mg nightly for anxiety and will monitor her status   8. RA: she is unchanged  her pain is stable prednisone 10 mg daily has vicodin 5/325 mg every 6 hours as needed for pain and robaxin 500 mg nightly  and will monitor her status.    9. Hypothyroidism: stable, continue levothyroxine 44mcg daily, continue to monitor.    10. Dizziness: Recent hbg and hct trending down, but overall stable. Continue to monitor for now. Continue meclizine 25mg  PO twice daily as needed.   11. Dysnpea: Exam negative. encourage patient to use albuterol hfa every 6 hrs as needed. Continue to monitor her status.   Patient/Staff Communication Plan of care discuss with patient and nursing staff. Patient and nursing staff verbalize understanding and agree with plan of care. No other questions or concerns reported.   Arthur Holms, MSN, AGNP-[facility] [address] [place], [place] [address] [phone] [8am-5pm]    Ok Edwards NP [facility]  Contact [phone] Monday through Friday 8am- 5pm  After hours call [phone]

## 2014-04-18 ENCOUNTER — Encounter: Payer: Self-pay | Admitting: *Deleted

## 2014-04-22 DIAGNOSIS — R079 Chest pain, unspecified: Secondary | ICD-10-CM | POA: Diagnosis not present

## 2014-04-24 DIAGNOSIS — D509 Iron deficiency anemia, unspecified: Secondary | ICD-10-CM | POA: Diagnosis not present

## 2014-04-24 DIAGNOSIS — E039 Hypothyroidism, unspecified: Secondary | ICD-10-CM | POA: Diagnosis not present

## 2014-04-24 DIAGNOSIS — K649 Unspecified hemorrhoids: Secondary | ICD-10-CM | POA: Insufficient documentation

## 2014-04-24 MED ORDER — ESOMEPRAZOLE MAGNESIUM 20 MG PO CPDR: 20.0000 mg | DELAYED_RELEASE_CAPSULE | Freq: Two times a day (BID) | ORAL | Status: DC

## 2014-04-24 NOTE — Progress Notes (Signed)
Patient ID: [name], female   DOB: [birth year], 80 y.o.   MRN: [id number]     [facility]  Allergies  Allergen Reactions  . Biphosphate   . Codeine     sick  . Morphine And Related Other (See Comments)    unknown  . Percocet [Oxycodone-Acetaminophen]     unknown  . Plavix [Clopidogrel Bisulfate] Other (See Comments)    unknown  . Sulfur Other (See Comments)    unknown     Chief Complaint  Patient presents with  . Acute Visit    patient concerns     HPI:  She would like her for her nexium to be changed to 20 mg twice daily she felt this helps better. She feels as though the allegra is no longer effective and would like to start on zyrtec daily. Her hemorrhoids are irritating her and she would like medication for this as well. She is also having right ear pain.   Past Medical History  Diagnosis Date  . SOB (shortness of breath)   . MI (myocardial infarction)   . Poor appetite   . Arthritis   . Fatigue   . Right ear pain   . Renal insufficiency   . RA (rheumatoid arthritis)   . PUD (peptic ulcer disease)   . GI bleed 2005  . GERD (gastroesophageal reflux disease)   . Hiatal hernia   . Hypothyroidism   . Osteoporosis   . Depression   . Anxiety   . Coronary artery disease   . CVA (cerebral vascular accident)   . TIA (transient ischemic attack)   . Fall   . DVT of lower extremity (deep venous thrombosis)   . HCAP (healthcare-associated pneumonia) 01/22/2013  . Enteritis due to Clostridium difficile 01/04/2013  . Sepsis 12/19/2012  . Recurrent colitis due to Clostridium difficile 06/02/2012  . Hypertension   . Asthma   . Hyperlipidemia   . Fatigue   . Weight loss   . Hemorrhoids   . Malnutrition   . Dyspnea     Past Surgical History  Procedure Laterality Date  . Nstemi  06/2010  . Spinal fusion surgery    . Knee surgery    . Cardiac catheterization      SHOWED RUPTURE PLAQUE IN THE LAD. THE LAD IS NONOBSTRUCTIVE WITH ONLY 30-40% STENOSIS  . Back  surgery    . Breast surgery  1964    x3  . Ankle surgery    . Colonoscopy      VITAL SIGNS BP 119/79  Pulse 59  Ht 5\' 6"  (1.676 m)  Wt 155 lb (70.308 kg)  BMI 25.03 kg/m2   Patient's Medications  New Prescriptions   No medications on file  Previous Medications   ALBUTEROL (PROVENTIL HFA;VENTOLIN HFA) 108 (90 BASE) MCG/ACT INHALER    Inhale 1 puff into the lungs every 6 (six) hours as needed for wheezing or shortness of breath.   AMLODIPINE (NORVASC) 10 MG TABLET    Take 1 tablet (10 mg total) by mouth daily.   ASPIRIN 81 MG CHEWABLE TABLET    Chew 81 mg by mouth daily.   CALCIUM CARBONATE (TUMS) 500 MG CHEWABLE TABLET    Chew 1 tablet (200 mg of elemental calcium total) by mouth 2 (two) times daily.   CHOLECALCIFEROL (VITAMIN D) 2000 UNITS CAPS    Take 1 capsule (2,000 Units total) by mouth daily.   CRANBERRY 200 MG CAPS    Take 2 capsules by mouth 2 (  two) times daily.   DEXTROMETHORPHAN-GUAIFENESIN (MUCINEX DM) 30-600 MG PER 12 HR TABLET    Take 1 tablet by mouth 2 (two) times daily as needed for cough.   ESOMEPRAZOLE (NEXIUM) 20 MG CAPSULE    Take 40 mg by mouth daily.    FERROUS SULFATE 325 (65 FE) MG TABLET    Take 325 mg by mouth daily with breakfast.   FEXOFENADINE (ALLEGRA) 180 MG TABLET    Take 1 tablet (180 mg total) by mouth daily.   HYDROCODONE-ACETAMINOPHEN (NORCO/VICODIN) 5-325 MG PER TABLET    Take 1 tablet by mouth every 6 (six) hours as needed for moderate pain.   IPRATROPIUM (ATROVENT) 0.03 % NASAL SPRAY    Place 2 sprays into both nostrils every 12 (twelve) hours.   LEVOTHYROXINE (SYNTHROID, LEVOTHROID) 50 MCG TABLET    Take 50 mcg by mouth daily.    LORAZEPAM (ATIVAN) 0.5 MG TABLET    Take one tablet by mouth every night at bedtime for anxiety   MECLIZINE (ANTIVERT) 25 MG TABLET    Take 25 mg by mouth 2 (two) times daily as needed for dizziness.   METHOCARBAMOL (ROBAXIN) 500 MG TABLET    Take 500 mg by mouth at bedtime.    METOPROLOL (LOPRESSOR) 50 MG TABLET     Take 50 mg by mouth 2 (two) times daily.    MULTIPLE VITAMIN (MULTIVITAMIN) TABLET    Take 1 tablet by mouth daily.     OMEGA-3 FATTY ACIDS (FISH OIL) 1200 MG CAPS    Take 1,200 mg by mouth daily.   POLYETHYL GLYCOL-PROPYL GLYCOL (SYSTANE) 0.4-0.3 % SOLN    Apply 1 drop to eye 2 (two) times daily.   PREDNISONE (DELTASONE) 10 MG TABLET    Take 10 mg by mouth daily.   ROSUVASTATIN (CRESTOR) 20 MG TABLET    Take 20 mg by mouth every evening.    SACCHAROMYCES BOULARDII (FLORASTOR) 250 MG CAPSULE    Take 250 mg by mouth daily.   SERTRALINE (ZOLOFT) 50 MG TABLET    Take 50 mg by mouth every morning.  Modified Medications   No medications on file  Discontinued Medications   No medications on file    SIGNIFICANT DIAGNOSTIC EXAMS  06-10-13: chest x-ray: no acute cardiopulmonary disease  11-09-13: dexa; t score -3.9  11-10-13: mammogram; No mammographic evidence of malignancy.    LABS REVIEWED:   04-15-13: wbc 10.3; hgb 11.8; hct 37.9; mcv 90.7. plt 177 tsh 0.369  04-20-13: tsh 0.314  05-20-13: tsh 0.580  06-21-13: chol 169; ldl 87; trig 409  06-27-13: urine culture: p mirabilis: rocephin  09-01-13: wbc 12; hgb 11.4; hct 33.2; mcv 87; plt 150; glucose 72; bun 25; creat 1.3; k+3.7; na++141; folate >25; vit b12: 545  10-03-13: chol 131; ldl 42; trig 245; liver normal albumin 3.0  11-23-13: urine culture: no growth 12-19-13: tsh 0.588  01-04-14: wbc 11.5; hgb 10.9; hct 34.9; mcv 91.1; plt 164  01-10-14: wbc 12.4 hgb 11.2; hct 35.9; mcv 91.6; plt 185; glucose 84; bun 32; creat 1.4; k+4.5 ;na++140; liver normal albumin 3.3; chol 135; ldl 45; trig 225; tsh 0.559      Review of Systems  Constitutional: Positive for malaise/fatigue. Negative for fever.   right ear pain  Eyes: Negative for blurred vision, double vision and discharge.  Respiratory:no cough; wheezing or shortness of breath has sinus congestion    Cardiovascular: Negative for chest pain and palpitations.  Gastrointestinal: no heart burn  no abdominal pain, diarrhea and  constipation.  Musculoskeletal: no joint of muscle pain  Skin: Negative.   Neurological:Negative for focal weakness and headaches.  Psychiatric/Behavioral: Negative for depression. The patient is not nervous/anxious.       Physical Exam  Constitutional: She is oriented to person, place, and time. She appears well-developed and well-nourished. No distress.  Neck: Neck supple. No enlarged lymph nodes present No JVD present.right ear canal inflamed around ear drum no exudate present.  Cardiovascular: Normal rate, regular rhythm and intact distal pulses.   Respiratory: Effort normal and breath sounds normal. No respiratory distress. She has no wheezes.  GI: Soft. Bowel sounds are normal. She exhibits no distension. There is no tenderness.  Musculoskeletal: Normal range of motion. She exhibits no edema.  Neurological: She is alert and oriented to person, place, and time.  Skin: Skin is warm and dry. She is not diaphoretic.  Psychiatric: She has a normal mood and affect.     ASSESSMENT/ PLAN:  1. Allergic rhinitis: will change her to zyrtec 10 mg daily and will monitor her status  2. Otitis media: will being cipro ear drops for one week and will monitor   3. Gerd: will change her nexium back to 20 mg twice daily   4. Hemorrhoids: will use preparation h twice daily as needed

## 2014-04-29 DIAGNOSIS — Z23 Encounter for immunization: Secondary | ICD-10-CM | POA: Diagnosis not present

## 2014-05-03 DIAGNOSIS — R42 Dizziness and giddiness: Secondary | ICD-10-CM | POA: Diagnosis not present

## 2014-05-03 DIAGNOSIS — H8110 Benign paroxysmal vertigo, unspecified ear: Secondary | ICD-10-CM | POA: Diagnosis not present

## 2014-05-04 DIAGNOSIS — M25579 Pain in unspecified ankle and joints of unspecified foot: Secondary | ICD-10-CM | POA: Diagnosis not present

## 2014-05-08 ENCOUNTER — Non-Acute Institutional Stay (SKILLED_NURSING_FACILITY): Payer: Medicare Other | Admitting: Adult Health

## 2014-05-08 DIAGNOSIS — R42 Dizziness and giddiness: Secondary | ICD-10-CM | POA: Diagnosis not present

## 2014-05-09 ENCOUNTER — Other Ambulatory Visit: Payer: Self-pay | Admitting: Internal Medicine

## 2014-05-09 DIAGNOSIS — R42 Dizziness and giddiness: Secondary | ICD-10-CM

## 2014-05-10 ENCOUNTER — Ambulatory Visit (HOSPITAL_COMMUNITY)
Admission: RE | Admit: 2014-05-10 | Discharge: 2014-05-10 | Disposition: A | Discharge status: Home / Self Care | Payer: Medicare Other | Source: Ambulatory Visit | Attending: Internal Medicine | Admitting: Internal Medicine

## 2014-05-10 DIAGNOSIS — R42 Dizziness and giddiness: Secondary | ICD-10-CM | POA: Diagnosis not present

## 2014-05-10 DIAGNOSIS — R112 Nausea with vomiting, unspecified: Secondary | ICD-10-CM | POA: Insufficient documentation

## 2014-05-11 ENCOUNTER — Non-Acute Institutional Stay (SKILLED_NURSING_FACILITY): Payer: Medicare Other | Admitting: Internal Medicine

## 2014-05-11 DIAGNOSIS — R42 Dizziness and giddiness: Secondary | ICD-10-CM

## 2014-05-11 DIAGNOSIS — E2740 Unspecified adrenocortical insufficiency: Secondary | ICD-10-CM

## 2014-05-11 DIAGNOSIS — F329 Major depressive disorder, single episode, unspecified: Secondary | ICD-10-CM | POA: Diagnosis not present

## 2014-05-11 DIAGNOSIS — D509 Iron deficiency anemia, unspecified: Secondary | ICD-10-CM | POA: Diagnosis not present

## 2014-05-11 DIAGNOSIS — I10 Essential (primary) hypertension: Secondary | ICD-10-CM

## 2014-05-11 DIAGNOSIS — F32A Depression, unspecified: Secondary | ICD-10-CM

## 2014-05-11 DIAGNOSIS — E038 Other specified hypothyroidism: Secondary | ICD-10-CM | POA: Diagnosis not present

## 2014-05-12 DIAGNOSIS — E039 Hypothyroidism, unspecified: Secondary | ICD-10-CM | POA: Diagnosis not present

## 2014-05-12 LAB — TSH: TSH: 0.66 u[IU]/mL (ref 0.41–5.90)

## 2014-05-15 ENCOUNTER — Ambulatory Visit (HOSPITAL_COMMUNITY): Admission: RE | Admit: 2014-05-15 | Payer: Medicare Other | Source: Ambulatory Visit

## 2014-05-17 ENCOUNTER — Encounter: Payer: Self-pay | Admitting: Adult Health

## 2014-05-17 DIAGNOSIS — R42 Dizziness and giddiness: Secondary | ICD-10-CM | POA: Insufficient documentation

## 2014-05-17 MED ORDER — MECLIZINE HCL 25 MG PO TABS
25.0000 mg | ORAL_TABLET | Freq: Three times a day (TID) | ORAL | Status: DC
Start: 2014-05-17 — End: 2014-06-08

## 2014-05-17 NOTE — Progress Notes (Signed)
Patient ID: [name], female   DOB: 1929-07-18, 80 y.o.   MRN: [id number]    [facility]   Allergies  Allergen Reactions  . Biphosphate   . Codeine     sick  . Morphine And Related Other (See Comments)    unknown  . Percocet [Oxycodone-Acetaminophen]     unknown  . Plavix [Clopidogrel Bisulfate] Other (See Comments)    unknown  . Sulfur Other (See Comments)    unknown       Chief Complaint  Patient presents with  . Acute Visit    vertigo     HPI:  She continues to have issues with vertigo. She has had an appointment with ENT on 05-03-14 with a negative physical exam and no further orders at that time. She has vertigo most of the time does have some nausea with the vertigo. She is worried that there is more wrong than just vertigo.    Past Medical History  Diagnosis Date  . SOB (shortness of breath)   . MI (myocardial infarction)   . Poor appetite   . Arthritis   . Fatigue   . Right ear pain   . Renal insufficiency   . RA (rheumatoid arthritis)   . PUD (peptic ulcer disease)   . GI bleed 2005  . GERD (gastroesophageal reflux disease)   . Hiatal hernia   . Hypothyroidism   . Osteoporosis   . Depression   . Anxiety   . Coronary artery disease   . CVA (cerebral vascular accident)   . TIA (transient ischemic attack)   . Fall   . DVT of lower extremity (deep venous thrombosis)   . HCAP (healthcare-associated pneumonia) 01/22/2013  . Enteritis due to Clostridium difficile 01/04/2013  . Sepsis 12/19/2012  . Recurrent colitis due to Clostridium difficile 06/02/2012  . Hypertension   . Asthma   . Hyperlipidemia   . Fatigue   . Weight loss   . Hemorrhoids   . Malnutrition   . Dyspnea     Past Surgical History  Procedure Laterality Date  . Nstemi  06/2010  . Spinal fusion surgery    . Knee surgery    . Cardiac catheterization      SHOWED RUPTURE PLAQUE IN THE LAD. THE LAD IS NONOBSTRUCTIVE WITH ONLY 30-40% STENOSIS  . Back surgery    . Breast surgery   1964    x3  . Ankle surgery    . Colonoscopy      VITAL SIGNS BP 126/64 mmHg  Pulse 84  Ht 5\' 6"  (1.676 m)  Wt 160 lb (72.576 kg)  BMI 25.84 kg/m2  SpO2 95%   Outpatient Encounter Prescriptions as of 05/08/2014  Medication Sig  . albuterol (PROVENTIL HFA;VENTOLIN HFA) 108 (90 BASE) MCG/ACT inhaler Inhale 1 puff into the lungs every 6 (six) hours as needed for wheezing or shortness of breath.  Marland Kitchen amLODipine (NORVASC) 10 MG tablet Take 1 tablet (10 mg total) by mouth daily.  . Ascorbic Acid (VITAMIN C) 500 MG CHEW Chew by mouth.  Marland Kitchen aspirin 81 MG chewable tablet Chew 81 mg by mouth daily.  . benzonatate (TESSALON) 100 MG capsule Take by mouth as needed for cough.  . calcium carbonate (TUMS) 500 MG chewable tablet Chew 1 tablet (200 mg of elemental calcium total) by mouth 2 (two) times daily.  . Allegra 180 mg  Take daily.  . Cholecalciferol (VITAMIN D) 2000 UNITS CAPS Take 1 capsule (2,000 Units total) by mouth daily.  Marland Kitchen  Cranberry 200 MG CAPS Take 2 capsules by mouth 2 (two) times daily.  Marland Kitchen dextromethorphan-guaiFENesin (MUCINEX DM) 30-600 MG per 12 hr tablet Take 1 tablet by mouth 2 (two) times daily as needed for cough.  . esomeprazole (NEXIUM) 20 MG capsule Take 1 capsule (20 mg total) by mouth 2 (two) times daily before a meal.  . ferrous sulfate 325 (65 FE) MG tablet Take 325 mg by mouth daily with breakfast.  . folic acid (FOLVITE) 1 MG tablet Take 1 mg by mouth every morning.  Marland Kitchen HYDROcodone-acetaminophen (NORCO/VICODIN) 5-325 MG per tablet Take 1 tablet by mouth every 6 (six) hours as needed for moderate pain.  Marland Kitchen ipratropium (ATROVENT) 0.03 % nasal spray Place 2 sprays into both nostrils every 12 (twelve) hours.  Marland Kitchen levothyroxine (SYNTHROID, LEVOTHROID) 50 MCG tablet Take 50 mcg by mouth daily.   Marland Kitchen LORazepam (ATIVAN) 0.5 MG tablet Take one tablet by mouth every night at bedtime for anxiety  . meclizine (ANTIVERT) 25 MG tablet Take 25 mg by mouth 2 (two) times daily as needed for  dizziness.  . methocarbamol (ROBAXIN) 500 MG tablet Take 500 mg by mouth at bedtime.   . metoprolol (LOPRESSOR) 50 MG tablet Take 50 mg by mouth 2 (two) times daily.   . Multiple Vitamin (MULTIVITAMIN) tablet Take 1 tablet by mouth daily.    . nitroGLYCERIN (NITROSTAT) 0.4 MG SL tablet Place 0.4 mg under the tongue as needed for chest pain.  . Omega-3 Fatty Acids (FISH OIL) 1200 MG CAPS Take 1,200 mg by mouth daily.  Vladimir Faster Glycol-Propyl Glycol (SYSTANE) 0.4-0.3 % SOLN Apply 1 drop to eye 2 (two) times daily.  . predniSONE (DELTASONE) 10 MG tablet Take 10 mg by mouth daily.  . promethazine (PHENERGAN) 25 MG tablet Take 25 mg by mouth every 6 (six) hours as needed for nausea or vomiting.  . rosuvastatin (CRESTOR) 20 MG tablet Take 20 mg by mouth every evening.   . saccharomyces boulardii (FLORASTOR) 250 MG capsule Take 250 mg by mouth daily.  . sertraline (ZOLOFT) 50 MG tablet Take 50 mg by mouth every morning.     SIGNIFICANT DIAGNOSTIC EXAMS  06-10-13: chest x-ray: no acute cardiopulmonary disease  11-09-13: dexa; t score -3.9  11-10-13: mammogram; No mammographic evidence of malignancy.   04-22-14: chest x-ray: no acute abnormalities; ribs negative for fracture  05-04-14: left foot x-ray: negative for fracture     LABS REVIEWED:    06-21-13: chol 169; ldl 87; trig 409  06-27-13: urine culture: p mirabilis: rocephin  09-01-13: wbc 12; hgb 11.4; hct 33.2; mcv 87; plt 150; glucose 72; bun 25; creat 1.3; k+3.7; na++141; folate >25; vit b12: 545  10-03-13: chol 131; ldl 42; trig 245; liver normal albumin 3.0  11-23-13: urine culture: no growth 12-19-13: tsh 0.588  01-04-14: wbc 11.5; hgb 10.9; hct 34.9; mcv 91.1; plt 164  01-10-14: wbc 12.4; hgb 11.2; hct 35.9; mcv 91.6; plt 140; glucose 84; bun 32; creat 1.4; k+4.5; na+= 140; liver normal albumin 3.3; chol 135; ldl 45; trig 225; tsh 6.283;  1-51-76: folic 16.0; vit V37: 106 04-24-14: wbc 13.9; hgb 11.3; hct 34.9; mcv 91.6; plt 173;   tsh 1.16   Review of Systems  Constitutional: Positive for malaise/fatigue.  Respiratory: Negative for cough and shortness of breath.   Cardiovascular: Negative for chest pain, palpitations and leg swelling.  Gastrointestinal: Negative for heartburn, abdominal pain and constipation.  Musculoskeletal: Negative for myalgias and joint pain.  Skin: Negative.   Neurological:  Positive for dizziness. Negative for headaches.  Psychiatric/Behavioral: Negative for depression. The patient is not nervous/anxious.      Physical Exam  Constitutional: She is oriented to person, place, and time. She appears well-developed and well-nourished. No distress.  Neck: Neck supple. No JVD present. No thyromegaly present.  Cardiovascular: Normal rate, regular rhythm and intact distal pulses.   Respiratory: Effort normal and breath sounds normal. No respiratory distress. She has no wheezes.  GI: Soft. Bowel sounds are normal. She exhibits no distension. There is no tenderness.  Musculoskeletal: Normal range of motion. She exhibits no edema.  Neurological: She is alert and oriented to person, place, and time. She displays normal reflexes. No cranial nerve deficit.  Skin: Skin is warm and dry. She is not diaphoretic.       ASSESSMENT/ PLAN:  1. Vertigo: will change her meclizine to 25 mg three times daily; will obtain orthostatic vital signs daily; will get an mri of brain without contrast asap and will continue to monitor her status. We did discuss vertigo the possible causes. She did verbalize knowing that there are times when there is no definite cause of vertigo to be found.    Ok Edwards NP [facility]  Contact [phone] Monday through Friday 8am- 5pm  After hours call [phone]

## 2014-05-23 ENCOUNTER — Ambulatory Visit (HOSPITAL_COMMUNITY): Admission: RE | Admit: 2014-05-23 | Payer: Medicare Other | Source: Ambulatory Visit

## 2014-05-29 NOTE — Progress Notes (Signed)
Patient ID: [name], female   DOB: 02/24/1930, 80 y.o.   MRN: [id number]    Facility: [facility]   Chief Complaint  Patient presents with  . Medical Management of Chronic Issues   Allergies  Allergen Reactions  . Biphosphate   . Codeine     sick  . Morphine And Related Other (See Comments)    unknown  . Percocet [Oxycodone-Acetaminophen]     unknown  . Plavix [Clopidogrel Bisulfate] Other (See Comments)    unknown  . Sulfur Other (See Comments)    unknown   HPI 80 y/o female pt is seen for RV. She has been having dizziness with change of position. She has normal labs, mri brian is normal. Otherwise, no other concerns reported. She participates in therapy. No falls reported. No skin issues. No other concerns reported from nursing staff.   Review of Systems  Constitutional: Negative for fever.    Eyes: Negative for blurred vision, double vision and discharge.  Respiratory:Postive for dyspnea with exertion. No wheezes   Cardiovascular: Negative for chest pain and palpitations.  Gastrointestinal: no abdominal pain, diarrhea and constipation. No bloody stool.   Skin: Negative for rash or wounds Neurological:Negative for focal weakness and headaches.  Psychiatric/Behavioral: Negative for depression. The patient is not nervous/anxious.      Past Medical History  Diagnosis Date  . SOB (shortness of breath)   . MI (myocardial infarction)   . Poor appetite   . Arthritis   . Fatigue   . Right ear pain   . Renal insufficiency   . RA (rheumatoid arthritis)   . PUD (peptic ulcer disease)   . GI bleed 2005  . GERD (gastroesophageal reflux disease)   . Hiatal hernia   . Hypothyroidism   . Osteoporosis   . Depression   . Anxiety   . Coronary artery disease   . CVA (cerebral vascular accident)   . TIA (transient ischemic attack)   . Fall   . DVT of lower extremity (deep venous thrombosis)   . HCAP (healthcare-associated pneumonia) 01/22/2013    . Enteritis due to Clostridium difficile 01/04/2013  . Sepsis 12/19/2012  . Recurrent colitis due to Clostridium difficile 06/02/2012  . Hypertension   . Asthma   . Hyperlipidemia   . Fatigue   . Weight loss   . Hemorrhoids   . Malnutrition   . Dyspnea    Past Surgical History  Procedure Laterality Date  . Nstemi  06/2010  . Spinal fusion surgery    . Knee surgery    . Cardiac catheterization      SHOWED RUPTURE PLAQUE IN THE LAD. THE LAD IS NONOBSTRUCTIVE WITH ONLY 30-40% STENOSIS  . Back surgery    . Breast surgery  1964    x3  . Ankle surgery    . Colonoscopy     Medication reviewed. See [facility]  Physical exam BP 128/61 mmHg  Pulse 78  Temp(Src) 98.5 F (36.9 C)  Resp 18  Constitutional: elderly female in no acute distress.   HEENT: normocephalic, atraumatic. No icterus or pallor Neck: Neck supple. No JVD present. Cardiovascular: Normal rate, regular rhythm. S1, S2, no murmurs, rubs, or gallops. Intact distal pulses.   Respiratory: Effort normal and breath sounds normal. No respiratory distress. She has no wheezes.  GI: Soft. Bowel sounds are normal. She exhibits no distension. There is no tenderness.  Musculoskeletal: Normal range of motion. She exhibits no edema.  Neurological: She is alert and  oriented to person, place, and time.  Skin: Skin is warm and dry. She is not diaphoretic.  Psychiatric: She has a normal mood and affect.   Assessment/plan  Vertigo Labs rule out electrolyte abnormality and acute hb drop. Negative for orthostasis. Mri brain normal. Concern for iatrogenic cause. D/c amlodipine for now and check bp q shift to assess need for bp med. Also increase meclizine to 25 mg q8h. Check tsh and am cortisol given her hx of adrenal insufficiency. If cortisol level low, adjust prednisone dosing  Hypertension continue lopressor 25 mg twice daily    Anemia continue ferrous sulfate 325mg  daily  Adrenal insufficiency ontinue prednisone 10 mg daily and will  monitor   Depression and anxiety:  continue zoloft 50 mg daily and ativan 0.5 mg daily  Hypothyroidism continue levothyroxine 10mcg daily, check tsh

## 2014-06-01 DIAGNOSIS — F341 Dysthymic disorder: Secondary | ICD-10-CM | POA: Diagnosis not present

## 2014-06-01 DIAGNOSIS — F411 Generalized anxiety disorder: Secondary | ICD-10-CM | POA: Diagnosis not present

## 2014-06-06 DIAGNOSIS — F411 Generalized anxiety disorder: Secondary | ICD-10-CM | POA: Diagnosis not present

## 2014-06-06 DIAGNOSIS — F341 Dysthymic disorder: Secondary | ICD-10-CM | POA: Diagnosis not present

## 2014-06-08 ENCOUNTER — Non-Acute Institutional Stay (SKILLED_NURSING_FACILITY): Payer: Medicare Other | Admitting: Registered Nurse

## 2014-06-08 ENCOUNTER — Encounter: Payer: Self-pay | Admitting: Registered Nurse

## 2014-06-08 DIAGNOSIS — F32A Depression, unspecified: Secondary | ICD-10-CM

## 2014-06-08 DIAGNOSIS — J309 Allergic rhinitis, unspecified: Secondary | ICD-10-CM | POA: Diagnosis not present

## 2014-06-08 DIAGNOSIS — E2740 Unspecified adrenocortical insufficiency: Secondary | ICD-10-CM | POA: Diagnosis not present

## 2014-06-08 DIAGNOSIS — K219 Gastro-esophageal reflux disease without esophagitis: Secondary | ICD-10-CM

## 2014-06-08 DIAGNOSIS — I10 Essential (primary) hypertension: Secondary | ICD-10-CM

## 2014-06-08 DIAGNOSIS — E038 Other specified hypothyroidism: Secondary | ICD-10-CM | POA: Diagnosis not present

## 2014-06-08 DIAGNOSIS — F329 Major depressive disorder, single episode, unspecified: Secondary | ICD-10-CM

## 2014-06-08 DIAGNOSIS — E785 Hyperlipidemia, unspecified: Secondary | ICD-10-CM

## 2014-06-08 DIAGNOSIS — M069 Rheumatoid arthritis, unspecified: Secondary | ICD-10-CM | POA: Diagnosis not present

## 2014-06-08 DIAGNOSIS — F419 Anxiety disorder, unspecified: Secondary | ICD-10-CM

## 2014-06-08 DIAGNOSIS — D509 Iron deficiency anemia, unspecified: Secondary | ICD-10-CM | POA: Diagnosis not present

## 2014-06-08 NOTE — Progress Notes (Signed)
Patient ID: [name], female   DOB: [birth year], 79 y.o.   MRN: [id number]   Place of Service: [facility]  Allergies  Allergen Reactions  . Biphosphate   . Codeine     sick  . Morphine And Related Other (See Comments)    unknown  . Percocet [Oxycodone-Acetaminophen]     unknown  . Plavix [Clopidogrel Bisulfate] Other (See Comments)    unknown  . Sulfur Other (See Comments)    unknown    Code Status: Full Code  Goals of Care: Longevity/LTC  Chief Complaint  Patient presents with  . Medical Management of Chronic Issues    RA, adrenal insufficiency, GERD, depression, HTN, AR, hypothyroidism    HPI 80 y.o. female with PMH of RA, adrenal insufficiency, GERD, depression, HTN, AR, hypothyroidism among others is being seen for a routine visit for management of her chronic issues. Weight stable. No recent fall or skin concerns reported. No change in behaviors or functional status reported. No concerns from staff. Vertigo/dizziness has resolved since she stopped taking to the meclizine. Otherwise, no complaints verbalized by patient. HTN is well controlled-BG range between 110s-120s/60s-70s. Adrenal insufficiency stable on prednisone. Hypothyroidism stable on levothyroxine, last tsh normal.   Review of Systems Constitutional: Negative for fever, chills, and fatigue. HENT: Negative for ear pain, congestion, and sore throat Eyes: Negative for eye pain, eye discharge, and visual disturbance  Cardiovascular: Negative for chest pain, palpitations, and leg swelling Respiratory: Negative cough and wheezing. Positive for shortness of breath with exertion Gastrointestinal: Negative for nausea and vomiting. Negative for abdominal pain, diarrhea and constipation.  Genitourinary: Negative for  dysuria and hematuria Endocrine: Negative for polydipsia, polyphagia, and polyuria Musculoskeletal: Negative for back pain, joint pain, and joint swelling  Neurological: Negative for  dizziness and headache  Skin: Negative for rash and wound.   Psychiatric: Negative for depression.   Past Medical History  Diagnosis Date  . SOB (shortness of breath)   . MI (myocardial infarction)   . Poor appetite   . Arthritis   . Fatigue   . Right ear pain   . Renal insufficiency   . RA (rheumatoid arthritis)   . PUD (peptic ulcer disease)   . GI bleed 2005  . GERD (gastroesophageal reflux disease)   . Hiatal hernia   . Hypothyroidism   . Osteoporosis   . Depression   . Anxiety   . Coronary artery disease   . CVA (cerebral vascular accident)   . TIA (transient ischemic attack)   . Fall   . DVT of lower extremity (deep venous thrombosis)   . HCAP (healthcare-associated pneumonia) 01/22/2013  . Enteritis due to Clostridium difficile 01/04/2013  . Sepsis 12/19/2012  . Recurrent colitis due to Clostridium difficile 06/02/2012  . Hypertension   . Asthma   . Hyperlipidemia   . Fatigue   . Weight loss   . Hemorrhoids   . Malnutrition   . Dyspnea     Past Surgical History  Procedure Laterality Date  . Nstemi  06/2010  . Spinal fusion surgery    . Knee surgery    . Cardiac catheterization      SHOWED RUPTURE PLAQUE IN THE LAD. THE LAD IS NONOBSTRUCTIVE WITH ONLY 30-40% STENOSIS  . Back surgery    . Breast surgery  1964    x3  . Ankle surgery    . Colonoscopy      History   Social History  . Marital Status: Widowed  Spouse Name: N/A    Number of Children: N/A  . Years of Education: N/A   Occupational History  . Not on file.   Social History Main Topics  . Smoking status: Never Smoker   . Smokeless tobacco: Never Used  . Alcohol Use: No  . Drug Use: No  . Sexual Activity: No   Other Topics Concern  . Not on file   Social History Narrative   Patient used to work at [employer] for 25 years until she retired   She lives by herself in [place] until she had a stroke earlier in 2013 June   Her next of kin is her daughter   She does get physical therapy was  everyday at [facility]      Caffeine use: coffee in the AM    Family History  Problem Relation Age of Onset  . Kidney disease Mother   . Kidney disease Brother   . Lung cancer Father       Medication List       This list is accurate as of: 06/08/14 11:44 PM.  Always use your most recent med list.               albuterol 108 (90 BASE) MCG/ACT inhaler  Commonly known as:  PROVENTIL HFA;VENTOLIN HFA  Inhale 1 puff into the lungs every 6 (six) hours as needed for wheezing or shortness of breath.     amLODipine 10 MG tablet  Commonly known as:  NORVASC  Take 1 tablet (10 mg total) by mouth daily.     aspirin 81 MG chewable tablet  Chew 81 mg by mouth daily.     benzonatate 100 MG capsule  Commonly known as:  TESSALON  Take by mouth as needed for cough.     calcium carbonate 500 MG chewable tablet  Commonly known as:  TUMS  Chew 1 tablet (200 mg of elemental calcium total) by mouth 2 (two) times daily.     cetirizine 10 MG tablet  Commonly known as:  ZYRTEC  Take 10 mg by mouth daily.     Cranberry 200 MG Caps  Take 2 capsules by mouth 2 (two) times daily.     dextromethorphan-guaiFENesin 30-600 MG per 12 hr tablet  Commonly known as:  MUCINEX DM  Take 1 tablet by mouth 2 (two) times daily as needed for cough.     ferrous sulfate 325 (65 FE) MG tablet  Take 325 mg by mouth daily with breakfast.     Fish Oil 1200 MG Caps  Take 1,200 mg by mouth daily.     folic acid 1 MG tablet  Commonly known as:  FOLVITE  Take 1 mg by mouth every morning.     HYDROcodone-acetaminophen 5-325 MG per tablet  Commonly known as:  NORCO/VICODIN  Take 1 tablet by mouth every 6 (six) hours as needed for moderate pain.     ipratropium 0.03 % nasal spray  Commonly known as:  ATROVENT  Place 2 sprays into both nostrils every 12 (twelve) hours.     levothyroxine 50 MCG tablet  Commonly known as:  SYNTHROID, LEVOTHROID  Take 50 mcg by mouth daily.     LORazepam  0.5 MG tablet  Commonly known as:  ATIVAN  Take one tablet by mouth every night at bedtime for anxiety     methocarbamol 500 MG tablet  Commonly known as:  ROBAXIN  Take 500 mg by mouth at bedtime.  metoprolol 50 MG tablet  Commonly known as:  LOPRESSOR  Take 50 mg by mouth 2 (two) times daily.     multivitamin tablet  Take 1 tablet by mouth daily.     nitroGLYCERIN 0.4 MG SL tablet  Commonly known as:  NITROSTAT  Place 0.4 mg under the tongue as needed for chest pain.     pantoprazole 20 MG tablet  Commonly known as:  PROTONIX  Take 20 mg by mouth 2 (two) times daily.     predniSONE 10 MG tablet  Commonly known as:  DELTASONE  Take 10 mg by mouth daily.     promethazine 25 MG tablet  Commonly known as:  PHENERGAN  Take 25 mg by mouth every 6 (six) hours as needed for nausea or vomiting.     rosuvastatin 20 MG tablet  Commonly known as:  CRESTOR  Take 20 mg by mouth every evening.     saccharomyces boulardii 250 MG capsule  Commonly known as:  FLORASTOR  Take 250 mg by mouth daily.     sertraline 50 MG tablet  Commonly known as:  ZOLOFT  Take 50 mg by mouth every morning.     SYSTANE 0.4-0.3 % Soln  Generic drug:  Polyethyl Glycol-Propyl Glycol  Apply 1 drop to eye 2 (two) times daily.     Vitamin C 500 MG Chew  Chew by mouth.     Vitamin D 2000 UNITS Caps  Take 1 capsule (2,000 Units total) by mouth daily.        Physical Exam  BP 120/80 mmHg  Pulse 86  Temp(Src) 97.4 F (36.3 C)  Resp 18  Ht 5\' 6"  (1.676 m)  Wt 157 lb 6.4 oz (71.396 kg)  BMI 25.42 kg/m2  Constitutional: WDWN elderly female in no acute distress.  HEENT: Normocephalic and atraumatic. PERRL. EOM intact. No icterus. No nasal discharge or sinus tenderness. Oral mucosa moist. Posterior pharynx clear of any exudate or lesions.  Neck: Supple and nontender. No lymphadenopathy, masses, or thyromegaly. No JVD or carotid bruits. Cardiac: Normal S1, S2. RRR without appreciable murmurs,  rubs, or gallops. Distal pulses intact. Trace pitting edema of BLE Lungs: No respiratory distress. Breath sounds clear bilaterally without rales, rhonchi, or wheezes. Abdomen: Audible bowel sounds in all quadrants. Soft, nontender, nondistended.  Musculoskeletal: able to move extremities. No joint erythema or tenderness. Moderate kyphosis present.  Skin: Warm and dry. No rash noted. No erythema.  Neurological: Alert and oriented to person, place, and time.  Psychiatric: Judgment and insight adequate. Appropriate mood and affect.   Labs Reviewed  CBC Latest Ref Rng 04/14/2014 01/04/2014 08/04/2013  WBC - 13.9 11.5 12.0  Hemoglobin 12.0 - 16.0 g/dL 11.3(A) 10.9(A) 11.4(A)  Hematocrit 36 - 46 % 35(A) 35(A) 33(A)  Platelets 150 - 399 K/L 173 164 140(A)    CMP Latest Ref Rng 01/10/2014 10/03/2013 08/04/2013  Glucose 70 - 99 mg/dL - - -  BUN 4 - 21 mg/dL 32(A) - 25(A)  Creatinine 0.5 - 1.1 mg/dL 1.4(A) - 1.3(A)  Sodium 137 - 147 mmol/L 140 - 141  Potassium 3.4 - 5.3 mmol/L 4.5 - 3.7  Chloride 96 - 112 mEq/L - - -  CO2 19 - 32 mEq/L - - -  Calcium 8.4 - 10.5 mg/dL - - -  Total Protein 6.0 - 8.3 g/dL - - -  Total Bilirubin 0.3 - 1.2 mg/dL - - -  Alkaline Phos 25 - 125 U/L - 53 -  AST 13 -  35 U/L - 18 -  ALT 7 - 35 U/L - 20 -    Lab Results  Component Value Date   TSH 0.66 05/12/2014    Lipid Panel     Component Value Date/Time   CHOL 131 10/03/2013   TRIG 245* 10/03/2013   HDL 50 12/02/2011 0655   CHOLHDL 2.9 12/02/2011 0655   VLDL 56* 12/02/2011 0655   LDLCALC 42 10/03/2013   LDLDIRECT 60.7 11/13/2010 1130    Assessment & Plan 1. Essential hypertension BP stable. Continue amlodipine 10mg  daily and lopressor 50mg  twice daily. Continue to monitor  2. Allergic rhinitis, unspecified allergic rhinitis type Stable. Continue zyrtec 10mg  daily and atrovent 0.03% 2 sprays in each nare twice daily. Continue to monitor  3. Gastroesophageal reflux disease without  esophagitis Discontinue nexium 20mg  twice daily. Start protonix 20mg  twice daily. Continue to monitor  4. Adrenal insufficiency Stable. Continue prednisone 10mg  daily and monitor.   5. Other specified hypothyroidism Stable. Continue levothyroxine 51mcg daily and monitor  6. RA (rheumatoid arthritis) Stable. Continue prednisone 10mg  daily, norco 5/325mg  every six hours as needed for pain, and robaxin 500mg  daily at bedtime. Continue to monitor  7. Depression Stable. Continue zoloft 50mg  daily and monitor for change in mood  8. Anemia, iron deficiency Stable. Continue ferrous sulfate 325mg  daily and monitor  9. Anxiety Stable. Continue ativan 0.5mg  daily at bedtime and monitor  10. HLD LDL at goal. Continue crestor 250mg  daily, fish oil 1200mg  daily, and monitor.   Diagnostic Studies/Labs Ordered: cbc w/ diff, cmp  Family/Staff Communication Plan of care discussed with resident and nursing staff. Resident and nursing staff verbalized understanding and agree with plan of care. No additional questions or concerns reported.    Arthur Holms, MSN, AGNP-C [facility] [address] [place], [place] [address] [phone] [8am-5pm] After hours: [phone]

## 2014-06-09 DIAGNOSIS — E0829 Diabetes mellitus due to underlying condition with other diabetic kidney complication: Secondary | ICD-10-CM | POA: Diagnosis not present

## 2014-06-09 LAB — CBC AND DIFFERENTIAL
HEMATOCRIT: 34 % — AB (ref 36–46)
Hemoglobin: 10.7 g/dL — AB (ref 12.0–16.0)
Platelets: 142 10*3/uL — AB (ref 150–399)
WBC: 12 10^3/mL

## 2014-06-09 LAB — BASIC METABOLIC PANEL
BUN: 33 mg/dL — AB (ref 4–21)
Creatinine: 1.5 mg/dL — AB (ref 0.5–1.1)
Glucose: 93 mg/dL
POTASSIUM: 4.5 mmol/L (ref 3.4–5.3)
Sodium: 142 mmol/L (ref 137–147)

## 2014-06-20 DIAGNOSIS — F411 Generalized anxiety disorder: Secondary | ICD-10-CM | POA: Diagnosis not present

## 2014-06-20 DIAGNOSIS — F341 Dysthymic disorder: Secondary | ICD-10-CM | POA: Diagnosis not present

## 2014-06-28 ENCOUNTER — Encounter: Payer: Self-pay | Admitting: Registered Nurse

## 2014-06-28 ENCOUNTER — Non-Acute Institutional Stay (SKILLED_NURSING_FACILITY): Payer: Medicare Other | Admitting: Registered Nurse

## 2014-06-28 DIAGNOSIS — K648 Other hemorrhoids: Secondary | ICD-10-CM | POA: Diagnosis not present

## 2014-06-28 DIAGNOSIS — R0609 Other forms of dyspnea: Secondary | ICD-10-CM

## 2014-06-28 DIAGNOSIS — K219 Gastro-esophageal reflux disease without esophagitis: Secondary | ICD-10-CM | POA: Diagnosis not present

## 2014-06-28 DIAGNOSIS — K644 Residual hemorrhoidal skin tags: Secondary | ICD-10-CM

## 2014-06-28 NOTE — Progress Notes (Signed)
Patient ID: [name], female   DOB: Jun 16, 1930, 80 y.o.   MRN: [id number]   Place of Service: [facility]  Allergies  Allergen Reactions  . Biphosphate   . Codeine     sick  . Morphine And Related Other (See Comments)    unknown  . Percocet [Oxycodone-Acetaminophen]     unknown  . Plavix [Clopidogrel Bisulfate] Other (See Comments)    unknown  . Sulfur Other (See Comments)    unknown    Code Status: Full Code  Goals of Care: Longevity/LTC  Chief Complaint  Patient presents with  . Acute Visit    GERD, exertional dyspnea     HPI 80 y.o. female with PMH of RA, adrenal insufficiency, GERD, depression, HTN, AR, hypothyroidism among others is being for an acute visit at the request of patient for persistent reflux since switching from nexium to protonix. She would like to have nexium back. She aslo reported shortness of breath, especially with physical activities and painful external hemorrhoid. No additional complaints reported.   Review of Systems Constitutional: Negative for fever, chills, and fatigue. HENT: Negative for ear pain, congestion, and sore throat Eyes: Negative for eye pain, eye discharge, and visual disturbance  Cardiovascular: Negative for chest pain, palpitations, and leg swelling Respiratory: Negative cough and wheezing. Positive for shortness of breath with exertion Gastrointestinal: Negative for nausea and vomiting. Negative for abdominal pain, diarrhea and constipation. Positive for hemorrhoid  Musculoskeletal: Negative for back pain, joint pain, and joint swelling  Neurological: Negative for dizziness and headache  Skin: Negative for rash and wound.   Psychiatric: Negative for depression.   Past Medical History  Diagnosis Date  . SOB (shortness of breath)   . MI (myocardial infarction)   . Poor appetite   . Arthritis   . Fatigue   . Right ear pain   . Renal insufficiency   . RA (rheumatoid arthritis)   . PUD (peptic ulcer disease)    . GI bleed 2005  . GERD (gastroesophageal reflux disease)   . Hiatal hernia   . Hypothyroidism   . Osteoporosis   . Depression   . Anxiety   . Coronary artery disease   . CVA (cerebral vascular accident)   . TIA (transient ischemic attack)   . Fall   . DVT of lower extremity (deep venous thrombosis)   . HCAP (healthcare-associated pneumonia) 01/22/2013  . Enteritis due to Clostridium difficile 01/04/2013  . Sepsis 12/19/2012  . Recurrent colitis due to Clostridium difficile 06/02/2012  . Hypertension   . Asthma   . Hyperlipidemia   . Fatigue   . Weight loss   . Hemorrhoids   . Malnutrition   . Dyspnea     Past Surgical History  Procedure Laterality Date  . Nstemi  06/2010  . Spinal fusion surgery    . Knee surgery    . Cardiac catheterization      SHOWED RUPTURE PLAQUE IN THE LAD. THE LAD IS NONOBSTRUCTIVE WITH ONLY 30-40% STENOSIS  . Back surgery    . Breast surgery  1964    x3  . Ankle surgery    . Colonoscopy      History   Social History  . Marital Status: Widowed    Spouse Name: N/A    Number of Children: N/A  . Years of Education: N/A   Occupational History  . Not on file.   Social History Main Topics  . Smoking status: Never Smoker   . Smokeless  tobacco: Never Used  . Alcohol Use: No  . Drug Use: No  . Sexual Activity: No   Other Topics Concern  . Not on file   Social History Narrative   Patient used to work at [employer] for 25 years until she retired   She lives by herself in [place] until she had a stroke earlier in 2013 June   Her next of kin is her daughter   She does get physical therapy was everyday at [facility]      Caffeine use: coffee in the AM    Family History  Problem Relation Age of Onset  . Kidney disease Mother   . Kidney disease Brother   . Lung cancer Father       Medication List       This list is accurate as of: 06/28/14  7:44 PM.  Always use your most recent med list.               albuterol  108 (90 BASE) MCG/ACT inhaler  Commonly known as:  PROVENTIL HFA;VENTOLIN HFA  Inhale 1 puff into the lungs every 6 (six) hours as needed for wheezing or shortness of breath.     amLODipine 10 MG tablet  Commonly known as:  NORVASC  Take 1 tablet (10 mg total) by mouth daily.     aspirin 81 MG chewable tablet  Chew 81 mg by mouth daily.     benzonatate 100 MG capsule  Commonly known as:  TESSALON  Take by mouth as needed for cough.     calcium carbonate 500 MG chewable tablet  Commonly known as:  TUMS  Chew 1 tablet (200 mg of elemental calcium total) by mouth 2 (two) times daily.     cetirizine 10 MG tablet  Commonly known as:  ZYRTEC  Take 10 mg by mouth daily.     Cranberry 200 MG Caps  Take 2 capsules by mouth 2 (two) times daily.     dextromethorphan-guaiFENesin 30-600 MG per 12 hr tablet  Commonly known as:  MUCINEX DM  Take 1 tablet by mouth 2 (two) times daily as needed for cough.     esomeprazole 20 MG capsule  Commonly known as:  NEXIUM  Take 20 mg by mouth 2 (two) times daily before a meal.     ferrous sulfate 325 (65 FE) MG tablet  Take 325 mg by mouth daily with breakfast.     Fish Oil 1200 MG Caps  Take 1,200 mg by mouth daily.     folic acid 1 MG tablet  Commonly known as:  FOLVITE  Take 1 mg by mouth every morning.     HYDROcodone-acetaminophen 5-325 MG per tablet  Commonly known as:  NORCO/VICODIN  Take 1 tablet by mouth every 6 (six) hours as needed for moderate pain.     ipratropium 0.03 % nasal spray  Commonly known as:  ATROVENT  Place 2 sprays into both nostrils every 12 (twelve) hours.     levothyroxine 50 MCG tablet  Commonly known as:  SYNTHROID, LEVOTHROID  Take 50 mcg by mouth daily.     LORazepam 0.5 MG tablet  Commonly known as:  ATIVAN  Take one tablet by mouth every night at bedtime for anxiety     methocarbamol 500 MG tablet  Commonly known as:  ROBAXIN  Take 500 mg by mouth at bedtime.     metoprolol 50 MG tablet    Commonly known as:  LOPRESSOR  Take 50 mg by mouth 2 (two) times daily.     multivitamin tablet  Take 1 tablet by mouth daily.     nitroGLYCERIN 0.4 MG SL tablet  Commonly known as:  NITROSTAT  Place 0.4 mg under the tongue as needed for chest pain.     predniSONE 10 MG tablet  Commonly known as:  DELTASONE  Take 10 mg by mouth daily.     promethazine 25 MG tablet  Commonly known as:  PHENERGAN  Take 25 mg by mouth every 6 (six) hours as needed for nausea or vomiting.     rosuvastatin 20 MG tablet  Commonly known as:  CRESTOR  Take 20 mg by mouth every evening.     saccharomyces boulardii 250 MG capsule  Commonly known as:  FLORASTOR  Take 250 mg by mouth daily.     sertraline 50 MG tablet  Commonly known as:  ZOLOFT  Take 50 mg by mouth every morning.     SYSTANE 0.4-0.3 % Soln  Generic drug:  Polyethyl Glycol-Propyl Glycol  Apply 1 drop to eye 2 (two) times daily.     Vitamin C 500 MG Chew  Chew by mouth.     Vitamin D 2000 UNITS Caps  Take 1 capsule (2,000 Units total) by mouth daily.        Physical Exam  BP 155/79 mmHg  Pulse 79  Temp(Src) 98 F (36.7 C)  Resp 20  Ht 5\' 6"  (1.676 m)  Wt 157 lb 6.4 oz (71.396 kg)  BMI 25.42 kg/m2  Constitutional: WDWN elderly female in no acute distress.  HEENT: Normocephalic and atraumatic. PERRL. EOM intact. No icterus.  Oral mucosa moist. Posterior pharynx clear of any exudate or lesions.  Neck: Supple and nontender. No lymphadenopathy, masses, or thyromegaly. No JVD or carotid bruits. Cardiac: Normal S1, S2. RRR without appreciable murmurs, rubs, or gallops. Distal pulses intact. Trace pitting edema of BLE Lungs: No respiratory distress. Breath sounds clear bilaterally without rales, rhonchi, or wheezes. Abdomen: Audible bowel sounds in all quadrants. Soft, nontender, nondistended.  Musculoskeletal: able to move extremities. No joint erythema or tenderness. Moderate kyphosis present.  Skin: Warm and dry. No rash  noted. No erythema.  Neurological: Alert and oriented to person, place, and time.  Psychiatric: Judgment and insight adequate. Appropriate mood and affect.   Labs Reviewed  CBC Latest Ref Rng 04/14/2014 01/04/2014 08/04/2013  WBC - 13.9 11.5 12.0  Hemoglobin 12.0 - 16.0 g/dL 11.3(A) 10.9(A) 11.4(A)  Hematocrit 36 - 46 % 35(A) 35(A) 33(A)  Platelets 150 - 399 K/L 173 164 140(A)    CMP Latest Ref Rng 01/10/2014 10/03/2013 08/04/2013  Glucose 70 - 99 mg/dL - - -  BUN 4 - 21 mg/dL 32(A) - 25(A)  Creatinine 0.5 - 1.1 mg/dL 1.4(A) - 1.3(A)  Sodium 137 - 147 mmol/L 140 - 141  Potassium 3.4 - 5.3 mmol/L 4.5 - 3.7  Chloride 96 - 112 mEq/L - - -  CO2 19 - 32 mEq/L - - -  Calcium 8.4 - 10.5 mg/dL - - -  Total Protein 6.0 - 8.3 g/dL - - -  Total Bilirubin 0.3 - 1.2 mg/dL - - -  Alkaline Phos 25 - 125 U/L - 53 -  AST 13 - 35 U/L - 18 -  ALT 7 - 35 U/L - 20 -    Lab Results  Component Value Date   TSH 0.66 05/12/2014    Lipid Panel     Component Value Date/Time  CHOL 131 10/03/2013   TRIG 245* 10/03/2013   HDL 50 12/02/2011 0655   CHOLHDL 2.9 12/02/2011 0655   VLDL 56* 12/02/2011 0655   LDLCALC 42 10/03/2013   LDLDIRECT 60.7 11/13/2010 1130    Assessment & Plan 1. Exertional dyspnea Encourage using albuterol inhaler prior to physical exertion and performing in restorative exercises regularly to build up endurance. Continue to monitor   2. Gastroesophageal reflux disease, esophagitis presence not specified Discontinue protonix. Restart nexium 20mg  twice daily. Continue to monitor  3. External hemorrhoid Explain to patient that she may need GI evaluation for possible banding of hemorrhoid, but she is not interested at this time. Continue to monitor for now.   Family/Staff Communication Plan of care discussed with resident and nursing staff. Resident and nursing staff verbalized understanding and agree with plan of care. No additional questions or concerns reported.    Arthur Holms,  MSN, AGNP-C [facility] [address] [place], [place] [address] [phone] [8am-5pm] After hours: [phone]

## 2014-07-06 DIAGNOSIS — F341 Dysthymic disorder: Secondary | ICD-10-CM | POA: Diagnosis not present

## 2014-07-06 DIAGNOSIS — E039 Hypothyroidism, unspecified: Secondary | ICD-10-CM | POA: Diagnosis not present

## 2014-07-06 DIAGNOSIS — F411 Generalized anxiety disorder: Secondary | ICD-10-CM | POA: Diagnosis not present

## 2014-07-06 LAB — TSH: TSH: 1.03 u[IU]/mL (ref 0.41–5.90)

## 2014-07-10 ENCOUNTER — Non-Acute Institutional Stay (SKILLED_NURSING_FACILITY): Payer: Medicare Other | Admitting: Registered Nurse

## 2014-07-10 ENCOUNTER — Encounter: Payer: Self-pay | Admitting: Registered Nurse

## 2014-07-10 DIAGNOSIS — F329 Major depressive disorder, single episode, unspecified: Secondary | ICD-10-CM | POA: Diagnosis not present

## 2014-07-10 DIAGNOSIS — I10 Essential (primary) hypertension: Secondary | ICD-10-CM | POA: Diagnosis not present

## 2014-07-10 DIAGNOSIS — E039 Hypothyroidism, unspecified: Secondary | ICD-10-CM | POA: Diagnosis not present

## 2014-07-10 DIAGNOSIS — F32A Depression, unspecified: Secondary | ICD-10-CM

## 2014-07-10 DIAGNOSIS — E2740 Unspecified adrenocortical insufficiency: Secondary | ICD-10-CM | POA: Diagnosis not present

## 2014-07-10 DIAGNOSIS — M069 Rheumatoid arthritis, unspecified: Secondary | ICD-10-CM

## 2014-07-10 DIAGNOSIS — F419 Anxiety disorder, unspecified: Secondary | ICD-10-CM

## 2014-07-10 DIAGNOSIS — N183 Chronic kidney disease, stage 3 unspecified: Secondary | ICD-10-CM

## 2014-07-10 DIAGNOSIS — M810 Age-related osteoporosis without current pathological fracture: Secondary | ICD-10-CM

## 2014-07-10 DIAGNOSIS — D509 Iron deficiency anemia, unspecified: Secondary | ICD-10-CM | POA: Diagnosis not present

## 2014-07-10 DIAGNOSIS — E785 Hyperlipidemia, unspecified: Secondary | ICD-10-CM | POA: Diagnosis not present

## 2014-07-10 DIAGNOSIS — J309 Allergic rhinitis, unspecified: Secondary | ICD-10-CM

## 2014-07-10 DIAGNOSIS — K219 Gastro-esophageal reflux disease without esophagitis: Secondary | ICD-10-CM

## 2014-07-10 NOTE — Progress Notes (Signed)
Patient ID: [name], female   DOB: [birth year], 81 y.o.   MRN: [id number]   Place of Service: [facility]  Allergies  Allergen Reactions  . Biphosphate   . Codeine     sick  . Morphine And Related Other (See Comments)    unknown  . Percocet [Oxycodone-Acetaminophen]     unknown  . Plavix [Clopidogrel Bisulfate] Other (See Comments)    unknown  . Sulfur Other (See Comments)    unknown    Code Status: Full Code  Goals of Care: Longevity/LTC  Chief Complaint  Patient presents with  . Medical Management of Chronic Issues    GERD, osteoporosis, HTN, AR, adrenal insufficiency, RA, hypothyroidism, depression, anemia, anxiety, HLD    HPI 81 y.o. female with PMH of RA, osteoporosis, adrenal insufficiency, GERD, depression, HTN, AR, hypothyroidism among others is being seen for a routine visit for management of her chronic issues. Weight stable. No recent fall or skin concerns reported. No change in behaviors or functional status reported. No concerns from staff.  BP slightly elevated today. Adrenal insufficiency stable on prednisone. Hypothyroidism stable on levothyroxine, recent TSH normal. GERD stable on ppi. Anemia stable with recent hgb/hct 10.7/33.7 and depression is stable on zoloft. Seen in room today. No complaints verbalized by patient  Review of Systems Constitutional: Negative for fever, chills, and fatigue. HENT: Negative for ear pain, congestion, and sore throat Eyes: Negative for eye pain, eye discharge, and visual disturbance  Cardiovascular: Negative for chest pain, palpitations, and leg swelling Respiratory: Negative cough, shortness of breath, and wheezing Gastrointestinal: Negative for nausea and vomiting. Negative for abdominal pain, diarrhea and constipation.  Genitourinary: Negative for  dysuria and hematuria Endocrine: Negative for polydipsia, polyphagia, and polyuria Musculoskeletal: Negative for back pain, joint pain, and joint swelling    Neurological: Negative for dizziness and headache  Skin: Negative for rash and wound.   Psychiatric: Negative for depression.   Past Medical History  Diagnosis Date  . SOB (shortness of breath)   . MI (myocardial infarction)   . Poor appetite   . Arthritis   . Fatigue   . Right ear pain   . Renal insufficiency   . RA (rheumatoid arthritis)   . PUD (peptic ulcer disease)   . GI bleed 2005  . GERD (gastroesophageal reflux disease)   . Hiatal hernia   . Hypothyroidism   . Osteoporosis   . Depression   . Anxiety   . Coronary artery disease   . CVA (cerebral vascular accident)   . TIA (transient ischemic attack)   . Fall   . DVT of lower extremity (deep venous thrombosis)   . HCAP (healthcare-associated pneumonia) 01/22/2013  . Enteritis due to Clostridium difficile 01/04/2013  . Sepsis 12/19/2012  . Recurrent colitis due to Clostridium difficile 06/02/2012  . Hypertension   . Asthma   . Hyperlipidemia   . Fatigue   . Weight loss   . Hemorrhoids   . Malnutrition   . Dyspnea     Past Surgical History  Procedure Laterality Date  . Nstemi  06/2010  . Spinal fusion surgery    . Knee surgery    . Cardiac catheterization      SHOWED RUPTURE PLAQUE IN THE LAD. THE LAD IS NONOBSTRUCTIVE WITH ONLY 30-40% STENOSIS  . Back surgery    . Breast surgery  1964    x3  . Ankle surgery    . Colonoscopy      History   Social  History  . Marital Status: Widowed    Spouse Name: N/A    Number of Children: N/A  . Years of Education: N/A   Occupational History  . Not on file.   Social History Main Topics  . Smoking status: Never Smoker   . Smokeless tobacco: Never Used  . Alcohol Use: No  . Drug Use: No  . Sexual Activity: No   Other Topics Concern  . Not on file   Social History Narrative   Patient used to work at [employer] for 25 years until she retired   She lives by herself in [place] until she had a stroke earlier in 2013 June   Her next of kin is her daughter   She  does get physical therapy was everyday at [facility]      Caffeine use: coffee in the AM    Family History  Problem Relation Age of Onset  . Kidney disease Mother   . Kidney disease Brother   . Lung cancer Father       Medication List       This list is accurate as of: 07/10/14 11:59 PM.  Always use your most recent med list.               albuterol 108 (90 BASE) MCG/ACT inhaler  Commonly known as:  PROVENTIL HFA;VENTOLIN HFA  Inhale 1 puff into the lungs every 6 (six) hours as needed for wheezing or shortness of breath.     aspirin 81 MG chewable tablet  Chew 81 mg by mouth daily.     calcium-vitamin D 500-200 MG-UNIT per tablet  Commonly known as:  OSCAL WITH D  Take 1 tablet by mouth 3 (three) times daily.     cetirizine 10 MG tablet  Commonly known as:  ZYRTEC  Take 10 mg by mouth daily.     Cranberry 200 MG Caps  Take 2 capsules by mouth 2 (two) times daily.     dextromethorphan-guaiFENesin 30-600 MG per 12 hr tablet  Commonly known as:  MUCINEX DM  Take 1 tablet by mouth 2 (two) times daily as needed for cough.     esomeprazole 20 MG capsule  Commonly known as:  NEXIUM  Take 20 mg by mouth 2 (two) times daily before a meal.     ferrous sulfate 325 (65 FE) MG tablet  Take 325 mg by mouth daily with breakfast.     Fish Oil 1200 MG Caps  Take 1,200 mg by mouth daily.     HYDROcodone-acetaminophen 5-325 MG per tablet  Commonly known as:  NORCO/VICODIN  Take 1 tablet by mouth every 6 (six) hours as needed for moderate pain. Every 4 to 6 hours as needed     ipratropium 0.03 % nasal spray  Commonly known as:  ATROVENT  Place 2 sprays into both nostrils every 12 (twelve) hours.     levothyroxine 50 MCG tablet  Commonly known as:  SYNTHROID, LEVOTHROID  Take 50 mcg by mouth daily.     LORazepam 0.5 MG tablet  Commonly known as:  ATIVAN  Take one tablet by mouth every night at bedtime for anxiety     methocarbamol 500 MG tablet    Commonly known as:  ROBAXIN  Take 500 mg by mouth at bedtime.     metoprolol tartrate 25 MG tablet  Commonly known as:  LOPRESSOR  Take 25 mg by mouth 2 (two) times daily.     multivitamin tablet  Take 1 tablet by mouth daily.     predniSONE 10 MG tablet  Commonly known as:  DELTASONE  Take 10 mg by mouth daily.     promethazine 25 MG tablet  Commonly known as:  PHENERGAN  Take 25 mg by mouth every 6 (six) hours as needed for nausea or vomiting.     rosuvastatin 20 MG tablet  Commonly known as:  CRESTOR  Take 20 mg by mouth every evening.     saccharomyces boulardii 250 MG capsule  Commonly known as:  FLORASTOR  Take 250 mg by mouth 2 (two) times daily.     sertraline 50 MG tablet  Commonly known as:  ZOLOFT  Take 50 mg by mouth every morning.     SYSTANE 0.4-0.3 % Soln  Generic drug:  Polyethyl Glycol-Propyl Glycol  Apply 1 drop to eye 2 (two) times daily.     Vitamin D 2000 UNITS Caps  Take 1 capsule (2,000 Units total) by mouth daily.        Physical Exam  BP 150/80 mmHg  Pulse 77  Temp(Src) 98 F (36.7 C)  Resp 20  Ht _0  (1.676 m)  Wt 157 lb (71.215 kg)  BMI 25.35 kg/m2  Constitutional: WDWN elderly female in no acute distress.  HEENT: Normocephalic and atraumatic. PERRL. EOM intact. No icterus. No nasal discharge or sinus tenderness. Oral mucosa moist. Posterior pharynx clear of any exudate or lesions.  Neck: Supple and nontender. Submandibular lymphadenopathy bilaterally, nontender to palpation. No  masses or thyromegaly noted. No JVD or carotid bruits. Cardiac: Normal S1, S2. RRR without appreciable murmurs, rubs, or gallops. Distal pulses intact. Trace pitting edema of BLE Lungs: No respiratory distress. Breath sounds clear bilaterally without rales, rhonchi, or wheezes. Abdomen: Audible bowel sounds in all quadrants. Soft, nontender, nondistended.  Musculoskeletal: able to move extremities. No joint erythema or tenderness. Moderate kyphosis.   Skin: Warm and dry. No rash noted. No erythema.  Neurological: Alert and oriented to person, place, and time.  Psychiatric: Judgment and insight adequate. Appropriate mood and affect.   Labs Reviewed  CBC Latest Ref Rng 06/09/2014 04/14/2014 01/04/2014  WBC - 12.0 13.9 11.5  Hemoglobin 12.0 - 16.0 g/dL 10.7(A) 11.3(A) 10.9(A)  Hematocrit 36 - 46 % 34(A) 35(A) 35(A)  Platelets 150 - 399 K/L 142(A) 173 164    CMP Latest Ref Rng 06/09/2014 01/10/2014 10/03/2013  Glucose 70 - 99 mg/dL - - -  BUN 4 - 21 mg/dL 33(A) 32(A) -  Creatinine 0.5 - 1.1 mg/dL 1.5(A) 1.4(A) -  Sodium 137 - 147 mmol/L 142 140 -  Potassium 3.4 - 5.3 mmol/L 4.5 4.5 -  Chloride 96 - 112 mEq/L - - -  CO2 19 - 32 mEq/L - - -  Calcium 8.4 - 10.5 mg/dL - - -  Total Protein 6.0 - 8.3 g/dL - - -  Total Bilirubin 0.3 - 1.2 mg/dL - - -  Alkaline Phos 25 - 125 U/L - - 53  AST 13 - 35 U/L - - 18  ALT 7 - 35 U/L - - 20    Lab Results  Component Value Date   TSH 1.03 07/06/2014    Lipid Panel     Component Value Date/Time   CHOL 131 10/03/2013   TRIG 245* 10/03/2013   HDL 50 12/02/2011 0655   CHOLHDL 2.9 12/02/2011 0655   VLDL 56* 12/02/2011 0655   LDLCALC 42 10/03/2013   LDLDIRECT 60.7 11/13/2010 1130    Assessment &  Plan 1. Essential hypertension BP slightly elevated today. Continue lopressor 101m twice daily for now. Nursing staff to check BP qshift x 3 days then QD and notify NP/MD if BP consistently >140/90. Reassess and continue to monitor  2. Allergic rhinitis, unspecified allergic rhinitis type Stable. Continue zyrtec 185mdaily and atrovent 0.03% 2 sprays in each nare twice daily. Continue to monitor  3. Gastroesophageal reflux disease without esophagitis Stable. Continue nexium 2020mwice daily.  4. Adrenal insufficiency Stable. Continue prednisone 50m64mily and monitor.   5. hypothyroidism Stable. Recent tsh 1.03. Continue levothyroxine 50mc47mily and monitor  6. RA (rheumatoid  arthritis) Stable. Continue prednisone 50mg 16my and norco 5/325mg e19m four to six hours as needed for pain with robaxin 500mg da56mat bedtime. Continue to monitor  7. Depression Stable. Continue zoloft 50mg dai63mnd monitor for change in mood  8. Anemia, iron deficiency Stable. Continue ferrous sulfate 325mg dail37md monitor  9. Anxiety Stable. Continue ativan 0.5mg daily 15mbedtime and monitor  10. HLD LDL at goal. Continue crestor 20mg daily 15m  fish oil 1200mg daily, 36mmonitor.   11. Osteoporosis Unable to take bisphosphonate. Continue oscal 500/200mg three ti69mdaily and vit D 2000iu daily. Continue to monitor   12. CKD stage 3 Stable. Recent eGFR 35. Avoids nephrotoxic drugs, especially NSAIDs. Continue to monitor renal function.    Family/Staff Communication Plan of care discussed with resident and nursing staff. Resident and nursing staff verbalized understanding and agree with plan of care. No additional questions or concerns reported.    Griff Badley, MSArthur HolmsPiedmont SenioWestern Pa [facility] [address])-[phone] hours: [phone]

## 2014-07-13 DIAGNOSIS — E784 Other hyperlipidemia: Secondary | ICD-10-CM | POA: Diagnosis not present

## 2014-07-13 DIAGNOSIS — D649 Anemia, unspecified: Secondary | ICD-10-CM | POA: Diagnosis not present

## 2014-07-13 DIAGNOSIS — E875 Hyperkalemia: Secondary | ICD-10-CM | POA: Diagnosis not present

## 2014-07-13 LAB — BASIC METABOLIC PANEL
BUN: 34 mg/dL — AB (ref 4–21)
Creatinine: 1.5 mg/dL — AB (ref 0.5–1.1)
GLUCOSE: 81 mg/dL
POTASSIUM: 4.1 mmol/L (ref 3.4–5.3)
Sodium: 141 mmol/L (ref 137–147)

## 2014-07-13 LAB — LIPID PANEL
Cholesterol: 161 mg/dL (ref 0–200)
HDL: 52 mg/dL (ref 35–70)
LDL Cholesterol: 59 mg/dL
TRIGLYCERIDES: 252 mg/dL — AB (ref 40–160)

## 2014-07-13 LAB — CBC AND DIFFERENTIAL
HEMATOCRIT: 36 % (ref 36–46)
HEMOGLOBIN: 11.7 g/dL — AB (ref 12.0–16.0)
Platelets: 145 10*3/uL — AB (ref 150–399)
WBC: 12.2 10^3/mL

## 2014-07-14 DIAGNOSIS — M79674 Pain in right toe(s): Secondary | ICD-10-CM | POA: Diagnosis not present

## 2014-07-14 DIAGNOSIS — M79675 Pain in left toe(s): Secondary | ICD-10-CM | POA: Diagnosis not present

## 2014-07-14 DIAGNOSIS — B351 Tinea unguium: Secondary | ICD-10-CM | POA: Diagnosis not present

## 2014-07-19 ENCOUNTER — Other Ambulatory Visit: Payer: Self-pay | Admitting: *Deleted

## 2014-07-19 DIAGNOSIS — M25561 Pain in right knee: Secondary | ICD-10-CM | POA: Diagnosis not present

## 2014-07-19 DIAGNOSIS — R278 Other lack of coordination: Secondary | ICD-10-CM | POA: Diagnosis not present

## 2014-07-19 DIAGNOSIS — M6281 Muscle weakness (generalized): Secondary | ICD-10-CM | POA: Diagnosis not present

## 2014-07-19 DIAGNOSIS — M25661 Stiffness of right knee, not elsewhere classified: Secondary | ICD-10-CM | POA: Diagnosis not present

## 2014-07-19 DIAGNOSIS — R262 Difficulty in walking, not elsewhere classified: Secondary | ICD-10-CM | POA: Diagnosis not present

## 2014-07-19 MED ORDER — HYDROCODONE-ACETAMINOPHEN 5-325 MG PO TABS
ORAL_TABLET | ORAL | Status: DC
Start: 2014-07-19 — End: 2014-11-06

## 2014-07-19 NOTE — Telephone Encounter (Signed)
[facility] 

## 2014-07-20 DIAGNOSIS — R278 Other lack of coordination: Secondary | ICD-10-CM | POA: Diagnosis not present

## 2014-07-20 DIAGNOSIS — M6281 Muscle weakness (generalized): Secondary | ICD-10-CM | POA: Diagnosis not present

## 2014-07-20 DIAGNOSIS — M25661 Stiffness of right knee, not elsewhere classified: Secondary | ICD-10-CM | POA: Diagnosis not present

## 2014-07-20 DIAGNOSIS — R262 Difficulty in walking, not elsewhere classified: Secondary | ICD-10-CM | POA: Diagnosis not present

## 2014-07-20 DIAGNOSIS — M25561 Pain in right knee: Secondary | ICD-10-CM | POA: Diagnosis not present

## 2014-07-21 DIAGNOSIS — R262 Difficulty in walking, not elsewhere classified: Secondary | ICD-10-CM | POA: Diagnosis not present

## 2014-07-21 DIAGNOSIS — M25561 Pain in right knee: Secondary | ICD-10-CM | POA: Diagnosis not present

## 2014-07-21 DIAGNOSIS — R278 Other lack of coordination: Secondary | ICD-10-CM | POA: Diagnosis not present

## 2014-07-21 DIAGNOSIS — M25661 Stiffness of right knee, not elsewhere classified: Secondary | ICD-10-CM | POA: Diagnosis not present

## 2014-07-21 DIAGNOSIS — M6281 Muscle weakness (generalized): Secondary | ICD-10-CM | POA: Diagnosis not present

## 2014-07-24 DIAGNOSIS — R278 Other lack of coordination: Secondary | ICD-10-CM | POA: Diagnosis not present

## 2014-07-24 DIAGNOSIS — M6281 Muscle weakness (generalized): Secondary | ICD-10-CM | POA: Diagnosis not present

## 2014-07-24 DIAGNOSIS — M25661 Stiffness of right knee, not elsewhere classified: Secondary | ICD-10-CM | POA: Diagnosis not present

## 2014-07-24 DIAGNOSIS — R262 Difficulty in walking, not elsewhere classified: Secondary | ICD-10-CM | POA: Diagnosis not present

## 2014-07-24 DIAGNOSIS — M25561 Pain in right knee: Secondary | ICD-10-CM | POA: Diagnosis not present

## 2014-07-25 DIAGNOSIS — R262 Difficulty in walking, not elsewhere classified: Secondary | ICD-10-CM | POA: Diagnosis not present

## 2014-07-25 DIAGNOSIS — M6281 Muscle weakness (generalized): Secondary | ICD-10-CM | POA: Diagnosis not present

## 2014-07-25 DIAGNOSIS — R278 Other lack of coordination: Secondary | ICD-10-CM | POA: Diagnosis not present

## 2014-07-25 DIAGNOSIS — M25661 Stiffness of right knee, not elsewhere classified: Secondary | ICD-10-CM | POA: Diagnosis not present

## 2014-07-25 DIAGNOSIS — M25561 Pain in right knee: Secondary | ICD-10-CM | POA: Diagnosis not present

## 2014-07-26 ENCOUNTER — Encounter: Payer: Self-pay | Admitting: Internal Medicine

## 2014-07-26 DIAGNOSIS — M6281 Muscle weakness (generalized): Secondary | ICD-10-CM | POA: Diagnosis not present

## 2014-07-26 DIAGNOSIS — R262 Difficulty in walking, not elsewhere classified: Secondary | ICD-10-CM | POA: Diagnosis not present

## 2014-07-26 DIAGNOSIS — M25561 Pain in right knee: Secondary | ICD-10-CM | POA: Diagnosis not present

## 2014-07-26 DIAGNOSIS — M25661 Stiffness of right knee, not elsewhere classified: Secondary | ICD-10-CM | POA: Diagnosis not present

## 2014-07-26 DIAGNOSIS — R278 Other lack of coordination: Secondary | ICD-10-CM | POA: Diagnosis not present

## 2014-07-27 ENCOUNTER — Non-Acute Institutional Stay (SKILLED_NURSING_FACILITY): Payer: Medicare Other | Admitting: Registered Nurse

## 2014-07-27 ENCOUNTER — Encounter: Payer: Self-pay | Admitting: Registered Nurse

## 2014-07-27 DIAGNOSIS — R278 Other lack of coordination: Secondary | ICD-10-CM | POA: Diagnosis not present

## 2014-07-27 DIAGNOSIS — M25561 Pain in right knee: Secondary | ICD-10-CM

## 2014-07-27 DIAGNOSIS — M25661 Stiffness of right knee, not elsewhere classified: Secondary | ICD-10-CM | POA: Diagnosis not present

## 2014-07-27 DIAGNOSIS — M6281 Muscle weakness (generalized): Secondary | ICD-10-CM | POA: Diagnosis not present

## 2014-07-27 DIAGNOSIS — R262 Difficulty in walking, not elsewhere classified: Secondary | ICD-10-CM | POA: Diagnosis not present

## 2014-07-27 DIAGNOSIS — M25569 Pain in unspecified knee: Secondary | ICD-10-CM | POA: Diagnosis not present

## 2014-07-27 NOTE — Progress Notes (Signed)
Patient ID: [name], female   DOB: Dec 06, 1929, 81 y.o.   MRN: [id number]   Place of Service: [facility]  Allergies  Allergen Reactions  . Biphosphate   . Codeine     sick  . Morphine And Related Other (See Comments)    unknown  . Percocet [Oxycodone-Acetaminophen]     unknown  . Plavix [Clopidogrel Bisulfate] Other (See Comments)    unknown  . Sulfur Other (See Comments)    unknown    Code Status: Full Code  Goals of Care: Longevity/LTC  Chief Complaint  Patient presents with  . Acute Visit    Right knee pain    HPI 81 y.o. female with PMH of RA, osteoporosis, adrenal insufficiency, GERD, depression, HTN, AR, hypothyroidism among others is being seen for an acute visit for the evaluation of right knee pain x 1 week. Pain is worse with movement. She does not want to take her oral pain medication or recently ordered Voltaren gel believing that it is bad for her heart as she is already taking prednisone daily. She has had Right knee surgery in the past and thinks that the pain could be from hardware malfunctions. No other complaints reported.   Review of Systems Constitutional: Negative for fever,chills, and fatigue. HENT: Negative for  congestion, and sore throat Eyes: Negative for eye pai and visual disturbance  Cardiovascular: Negative for chest pain, palpitations, and leg swelling Respiratory: Negative cough, shortness of breath, and wheezing Gastrointestinal: Negative for nausea and vomiting. Musculoskeletal: Negative for back pain. See HPI Neurological: Negative for dizziness and headache  Skin: Negative for rash and wound.   Psychiatric: Negative for depression.   Past Medical History  Diagnosis Date  . SOB (shortness of breath)   . MI (myocardial infarction)   . Poor appetite   . Arthritis   . Fatigue   . Right ear pain   . Renal insufficiency   . RA (rheumatoid arthritis)   . PUD (peptic ulcer disease)   . GI bleed 2005  . GERD  (gastroesophageal reflux disease)   . Hiatal hernia   . Hypothyroidism   . Osteoporosis   . Depression   . Anxiety   . Coronary artery disease   . CVA (cerebral vascular accident)   . TIA (transient ischemic attack)   . Fall   . DVT of lower extremity (deep venous thrombosis)   . HCAP (healthcare-associated pneumonia) 01/22/2013  . Enteritis due to Clostridium difficile 01/04/2013  . Sepsis 12/19/2012  . Recurrent colitis due to Clostridium difficile 06/02/2012  . Hypertension   . Asthma   . Hyperlipidemia   . Fatigue   . Weight loss   . Hemorrhoids   . Malnutrition   . Dyspnea     Past Surgical History  Procedure Laterality Date  . Nstemi  06/2010  . Spinal fusion surgery    . Knee surgery    . Cardiac catheterization      SHOWED RUPTURE PLAQUE IN THE LAD. THE LAD IS NONOBSTRUCTIVE WITH ONLY 30-40% STENOSIS  . Back surgery    . Breast surgery  1964    x3  . Ankle surgery    . Colonoscopy      History   Social History  . Marital Status: Widowed    Spouse Name: N/A    Number of Children: N/A  . Years of Education: N/A   Occupational History  . Not on file.   Social History Main Topics  . Smoking  status: Never Smoker   . Smokeless tobacco: Never Used  . Alcohol Use: No  . Drug Use: No  . Sexual Activity: No   Other Topics Concern  . Not on file   Social History Narrative   Patient used to work at [employer] for 25 years until she retired   She lives by herself in [place] until she had a stroke earlier in 2013 June   Her next of kin is her daughter   She does get physical therapy was everyday at [facility]      Caffeine use: coffee in the AM    Family History  Problem Relation Age of Onset  . Kidney disease Mother   . Kidney disease Brother   . Lung cancer Father       Medication List       This list is accurate as of: 07/27/14  1:46 PM.  Always use your most recent med list.               albuterol 108 (90 BASE) MCG/ACT inhaler   Commonly known as:  PROVENTIL HFA;VENTOLIN HFA  Inhale 1 puff into the lungs every 6 (six) hours as needed for wheezing or shortness of breath.     aspirin 81 MG chewable tablet  Chew 81 mg by mouth daily.     calcium-vitamin D 500-200 MG-UNIT per tablet  Commonly known as:  OSCAL WITH D  Take 1 tablet by mouth 3 (three) times daily.     cetirizine 10 MG tablet  Commonly known as:  ZYRTEC  Take 10 mg by mouth daily.     Cranberry 200 MG Caps  Take 2 capsules by mouth 2 (two) times daily.     dextromethorphan-guaiFENesin 30-600 MG per 12 hr tablet  Commonly known as:  MUCINEX DM  Take 1 tablet by mouth 2 (two) times daily as needed for cough.     esomeprazole 20 MG capsule  Commonly known as:  NEXIUM  Take 20 mg by mouth 2 (two) times daily before a meal.     ferrous sulfate 325 (65 FE) MG tablet  Take 325 mg by mouth daily with breakfast.     Fish Oil 1200 MG Caps  Take 1,200 mg by mouth daily.     HYDROcodone-acetaminophen 5-325 MG per tablet  Commonly known as:  NORCO/VICODIN  Take one tablet by mouth every 4 to 6 hours as needed for pain. Do not exceed 4gm of Tylenol in 24 hours     ipratropium 0.03 % nasal spray  Commonly known as:  ATROVENT  Place 2 sprays into both nostrils every 12 (twelve) hours.     levothyroxine 50 MCG tablet  Commonly known as:  SYNTHROID, LEVOTHROID  Take 50 mcg by mouth daily.     LORazepam 0.5 MG tablet  Commonly known as:  ATIVAN  Take one tablet by mouth every night at bedtime for anxiety     methocarbamol 500 MG tablet  Commonly known as:  ROBAXIN  Take 500 mg by mouth at bedtime.     metoprolol tartrate 25 MG tablet  Commonly known as:  LOPRESSOR  Take 25 mg by mouth 2 (two) times daily.     multivitamin tablet  Take 1 tablet by mouth daily.     predniSONE 10 MG tablet  Commonly known as:  DELTASONE  Take 10 mg by mouth daily.     promethazine 25 MG tablet  Commonly known as:  PHENERGAN  Take 25 mg by mouth every 6  (six) hours as needed for nausea or vomiting.     rosuvastatin 20 MG tablet  Commonly known as:  CRESTOR  Take 20 mg by mouth every evening.     saccharomyces boulardii 250 MG capsule  Commonly known as:  FLORASTOR  Take 250 mg by mouth 2 (two) times daily.     sertraline 50 MG tablet  Commonly known as:  ZOLOFT  Take 50 mg by mouth every morning.     SYSTANE 0.4-0.3 % Soln  Generic drug:  Polyethyl Glycol-Propyl Glycol  Apply 1 drop to eye 2 (two) times daily.     Vitamin D 2000 UNITS Caps  Take 1 capsule (2,000 Units total) by mouth daily.        Physical Exam  BP 137/73 mmHg  Pulse 74  Temp(Src) 97.6 F (36.4 C)  Resp 20  Constitutional: WDWN elderly female in no acute distress. Conversant and pleasant HEENT: Normocephalic and atraumatic. PERRL. EOM intact. No icterus. Neck: Supple and nontender. No  masses or thyromegaly noted. No JVD or carotid bruits. Cardiac: Normal S1, S2. RRR without appreciable murmurs, rubs, or gallops. Distal pulses intact. Trace pitting edema of BLE Lungs: No respiratory distress. Breath sounds clear bilaterally without rales, rhonchi, or wheezes. Abdomen: Audible bowel sounds in all quadrants. Soft, nontender, nondistended.  Musculoskeletal: able to move extremities. Moderate kyphosis. Limited ROM of right knee. No  Lateral-anterior knee tender to palpation. No erythema or edema noted.  Skin: Warm and dry. No rash noted.  Neurological: Alert and oriented to person, place, and time.  Psychiatric: Judgment and insight adequate. Appropriate mood and affect.   Labs Reviewed  CBC Latest Ref Rng 06/09/2014 04/14/2014 01/04/2014  WBC - 12.0 13.9 11.5  Hemoglobin 12.0 - 16.0 g/dL 10.7(A) 11.3(A) 10.9(A)  Hematocrit 36 - 46 % 34(A) 35(A) 35(A)  Platelets 150 - 399 K/L 142(A) 173 164    CMP Latest Ref Rng 06/09/2014 01/10/2014 10/03/2013  Glucose 70 - 99 mg/dL - - -  BUN 4 - 21 mg/dL 33(A) 32(A) -  Creatinine 0.5 - 1.1 mg/dL 1.5(A) 1.4(A) -    Sodium 137 - 147 mmol/L 142 140 -  Potassium 3.4 - 5.3 mmol/L 4.5 4.5 -  Chloride 96 - 112 mEq/L - - -  CO2 19 - 32 mEq/L - - -  Calcium 8.4 - 10.5 mg/dL - - -  Total Protein 6.0 - 8.3 g/dL - - -  Total Bilirubin 0.3 - 1.2 mg/dL - - -  Alkaline Phos 25 - 125 U/L - - 53  AST 13 - 35 U/L - - 18  ALT 7 - 35 U/L - - 20    Lab Results  Component Value Date   TSH 1.03 07/06/2014    Lipid Panel     Component Value Date/Time   CHOL 131 10/03/2013   TRIG 245* 10/03/2013   HDL 50 12/02/2011 0655   CHOLHDL 2.9 12/02/2011 0655   VLDL 56* 12/02/2011 0655   LDLCALC 42 10/03/2013   LDLDIRECT 60.7 11/13/2010 1130    Assessment & Plan 1. Right knee pain Mostly likely OA. Will get xray of right knee for further evaluation per resident's request. Encourage resident to utilize her pain medication for pain relief and provide education regarding side effects of Voltaren gel and Norco. D/c voltaren gel due to refusal. Reassess and continue to monitor for now.    Family/Staff Communication Plan of care discussed with resident and nursing staff.  Resident and nursing staff verbalized understanding and agree with plan of care. No additional questions or concerns reported.    Arthur Holms, MSN, AGNP-C [facility] [address] [place],  [address] [phone] [8am-5pm] After hours: [phone]

## 2014-07-28 DIAGNOSIS — M6281 Muscle weakness (generalized): Secondary | ICD-10-CM | POA: Diagnosis not present

## 2014-07-28 DIAGNOSIS — R278 Other lack of coordination: Secondary | ICD-10-CM | POA: Diagnosis not present

## 2014-07-28 DIAGNOSIS — M25561 Pain in right knee: Secondary | ICD-10-CM | POA: Diagnosis not present

## 2014-07-28 DIAGNOSIS — R262 Difficulty in walking, not elsewhere classified: Secondary | ICD-10-CM | POA: Diagnosis not present

## 2014-07-28 DIAGNOSIS — M25661 Stiffness of right knee, not elsewhere classified: Secondary | ICD-10-CM | POA: Diagnosis not present

## 2014-07-31 DIAGNOSIS — M6281 Muscle weakness (generalized): Secondary | ICD-10-CM | POA: Diagnosis not present

## 2014-07-31 DIAGNOSIS — R278 Other lack of coordination: Secondary | ICD-10-CM | POA: Diagnosis not present

## 2014-07-31 DIAGNOSIS — M25561 Pain in right knee: Secondary | ICD-10-CM | POA: Diagnosis not present

## 2014-07-31 DIAGNOSIS — R262 Difficulty in walking, not elsewhere classified: Secondary | ICD-10-CM | POA: Diagnosis not present

## 2014-08-01 DIAGNOSIS — R262 Difficulty in walking, not elsewhere classified: Secondary | ICD-10-CM | POA: Diagnosis not present

## 2014-08-01 DIAGNOSIS — M6281 Muscle weakness (generalized): Secondary | ICD-10-CM | POA: Diagnosis not present

## 2014-08-01 DIAGNOSIS — M25561 Pain in right knee: Secondary | ICD-10-CM | POA: Diagnosis not present

## 2014-08-01 DIAGNOSIS — R278 Other lack of coordination: Secondary | ICD-10-CM | POA: Diagnosis not present

## 2014-08-02 DIAGNOSIS — M6281 Muscle weakness (generalized): Secondary | ICD-10-CM | POA: Diagnosis not present

## 2014-08-02 DIAGNOSIS — R262 Difficulty in walking, not elsewhere classified: Secondary | ICD-10-CM | POA: Diagnosis not present

## 2014-08-02 DIAGNOSIS — M25561 Pain in right knee: Secondary | ICD-10-CM | POA: Diagnosis not present

## 2014-08-02 DIAGNOSIS — R278 Other lack of coordination: Secondary | ICD-10-CM | POA: Diagnosis not present

## 2014-08-03 DIAGNOSIS — R262 Difficulty in walking, not elsewhere classified: Secondary | ICD-10-CM | POA: Diagnosis not present

## 2014-08-03 DIAGNOSIS — M6281 Muscle weakness (generalized): Secondary | ICD-10-CM | POA: Diagnosis not present

## 2014-08-03 DIAGNOSIS — M25561 Pain in right knee: Secondary | ICD-10-CM | POA: Diagnosis not present

## 2014-08-03 DIAGNOSIS — R278 Other lack of coordination: Secondary | ICD-10-CM | POA: Diagnosis not present

## 2014-08-04 DIAGNOSIS — R262 Difficulty in walking, not elsewhere classified: Secondary | ICD-10-CM | POA: Diagnosis not present

## 2014-08-04 DIAGNOSIS — R278 Other lack of coordination: Secondary | ICD-10-CM | POA: Diagnosis not present

## 2014-08-04 DIAGNOSIS — M25561 Pain in right knee: Secondary | ICD-10-CM | POA: Diagnosis not present

## 2014-08-04 DIAGNOSIS — M6281 Muscle weakness (generalized): Secondary | ICD-10-CM | POA: Diagnosis not present

## 2014-08-07 ENCOUNTER — Non-Acute Institutional Stay (SKILLED_NURSING_FACILITY): Payer: Medicare Other | Admitting: Registered Nurse

## 2014-08-07 DIAGNOSIS — K219 Gastro-esophageal reflux disease without esophagitis: Secondary | ICD-10-CM

## 2014-08-07 DIAGNOSIS — E2740 Unspecified adrenocortical insufficiency: Secondary | ICD-10-CM | POA: Diagnosis not present

## 2014-08-07 DIAGNOSIS — M810 Age-related osteoporosis without current pathological fracture: Secondary | ICD-10-CM

## 2014-08-07 DIAGNOSIS — R278 Other lack of coordination: Secondary | ICD-10-CM | POA: Diagnosis not present

## 2014-08-07 DIAGNOSIS — F329 Major depressive disorder, single episode, unspecified: Secondary | ICD-10-CM

## 2014-08-07 DIAGNOSIS — M069 Rheumatoid arthritis, unspecified: Secondary | ICD-10-CM

## 2014-08-07 DIAGNOSIS — M25561 Pain in right knee: Secondary | ICD-10-CM

## 2014-08-07 DIAGNOSIS — F419 Anxiety disorder, unspecified: Secondary | ICD-10-CM | POA: Diagnosis not present

## 2014-08-07 DIAGNOSIS — E785 Hyperlipidemia, unspecified: Secondary | ICD-10-CM

## 2014-08-07 DIAGNOSIS — N183 Chronic kidney disease, stage 3 unspecified: Secondary | ICD-10-CM

## 2014-08-07 DIAGNOSIS — D509 Iron deficiency anemia, unspecified: Secondary | ICD-10-CM | POA: Diagnosis not present

## 2014-08-07 DIAGNOSIS — J309 Allergic rhinitis, unspecified: Secondary | ICD-10-CM | POA: Diagnosis not present

## 2014-08-07 DIAGNOSIS — E039 Hypothyroidism, unspecified: Secondary | ICD-10-CM

## 2014-08-07 DIAGNOSIS — M6281 Muscle weakness (generalized): Secondary | ICD-10-CM | POA: Diagnosis not present

## 2014-08-07 DIAGNOSIS — R262 Difficulty in walking, not elsewhere classified: Secondary | ICD-10-CM | POA: Diagnosis not present

## 2014-08-07 DIAGNOSIS — I10 Essential (primary) hypertension: Secondary | ICD-10-CM | POA: Diagnosis not present

## 2014-08-07 DIAGNOSIS — F32A Depression, unspecified: Secondary | ICD-10-CM

## 2014-08-08 ENCOUNTER — Encounter: Payer: Self-pay | Admitting: Registered Nurse

## 2014-08-08 NOTE — Progress Notes (Signed)
Patient ID: [name], female   DOB: Nov 02, 1929, 81 y.o.   MRN: [id number]   Place of Service: [facility]  Allergies  Allergen Reactions  . Biphosphate   . Codeine     sick  . Morphine And Related Other (See Comments)    unknown  . Percocet [Oxycodone-Acetaminophen]     unknown  . Plavix [Clopidogrel Bisulfate] Other (See Comments)    unknown  . Sulfur Other (See Comments)    unknown    Code Status: Full Code  Goals of Care: Longevity/LTC  Chief Complaint  Patient presents with  . Medical Management of Chronic Issues    RA, GERD, HTN, AR, depression, anemia, hypothyroidism    HPI 81 y.o. female with PMH of RA, osteoporosis, adrenal insufficiency, GERD, depression, HTN, AR, hypothyroidism among others is being seen for a routine visit for management of her chronic issues. Weight stable. No recent fall or skin concerns reported. No change in behaviors or functional status reported. No concerns from staff.  BP adequately controlled on current regimen. Adrenal insufficiency stable on prednisone. Hypothyroidism stable on levothyroxine, recent TSH normal. GERD stable on ppi. Anemia stable with recent hgb/hct 11.7/36.5. Mood stable on zoloft. Seen in room today. Reported still having right knee pain with adequate relief from biofreeze. Would like to have an order for it to leave at bedside. Denies any other concerns.   Review of Systems Constitutional: Negative for fever, chills, and fatigue. HENT: Negative for ear pain, congestion, and sore throat Eyes: Negative for eye pain, eye discharge, and visual disturbance  Cardiovascular: Negative for chest pain, palpitations, and leg swelling Respiratory: Negative cough, shortness of breath, and wheezing Gastrointestinal: Negative for nausea and vomiting. Negative for abdominal pain, diarrhea and constipation. Positive for GERD Genitourinary: Negative for  dysuria and hematuria Endocrine: Negative for polydipsia, polyphagia,  and polyuria Musculoskeletal: Negative for back pain. Positive for Right knee pain  Neurological: Negative for dizziness and headache  Skin: Negative for rash and wound.   Psychiatric: Negative for depression.   Past Medical History  Diagnosis Date  . SOB (shortness of breath)   . MI (myocardial infarction)   . Poor appetite   . Arthritis   . Fatigue   . Right ear pain   . Renal insufficiency   . RA (rheumatoid arthritis)   . PUD (peptic ulcer disease)   . GI bleed 2005  . GERD (gastroesophageal reflux disease)   . Hiatal hernia   . Hypothyroidism   . Osteoporosis   . Depression   . Anxiety   . Coronary artery disease   . CVA (cerebral vascular accident)   . TIA (transient ischemic attack)   . Fall   . DVT of lower extremity (deep venous thrombosis)   . HCAP (healthcare-associated pneumonia) 01/22/2013  . Enteritis due to Clostridium difficile 01/04/2013  . Sepsis 12/19/2012  . Recurrent colitis due to Clostridium difficile 06/02/2012  . Hypertension   . Asthma   . Hyperlipidemia   . Fatigue   . Weight loss   . Hemorrhoids   . Malnutrition   . Dyspnea     Past Surgical History  Procedure Laterality Date  . Nstemi  06/2010  . Spinal fusion surgery    . Knee surgery    . Cardiac catheterization      SHOWED RUPTURE PLAQUE IN THE LAD. THE LAD IS NONOBSTRUCTIVE WITH ONLY 30-40% STENOSIS  . Back surgery    . Breast surgery  1964  x3  . Ankle surgery    . Colonoscopy      History   Social History  . Marital Status: Widowed    Spouse Name: N/A    Number of Children: N/A  . Years of Education: N/A   Occupational History  . Not on file.   Social History Main Topics  . Smoking status: Never Smoker   . Smokeless tobacco: Never Used  . Alcohol Use: No  . Drug Use: No  . Sexual Activity: No   Other Topics Concern  . Not on file   Social History Narrative   Patient used to work at [employer] for 25 years until she retired   She lives by herself in [place]  until she had a stroke earlier in 2013 June   Her next of kin is her daughter   She does get physical therapy was everyday at [facility]      Caffeine use: coffee in the AM    Family History  Problem Relation Age of Onset  . Kidney disease Mother   . Kidney disease Brother   . Lung cancer Father       Medication List       This list is accurate as of: 08/07/14 11:59 PM.  Always use your most recent med list.               albuterol 108 (90 BASE) MCG/ACT inhaler  Commonly known as:  PROVENTIL HFA;VENTOLIN HFA  Inhale 1 puff into the lungs every 6 (six) hours as needed for wheezing or shortness of breath.     aspirin 81 MG chewable tablet  Chew 81 mg by mouth daily.     calcium-vitamin D 500-200 MG-UNIT per tablet  Commonly known as:  OSCAL WITH D  Take 1 tablet by mouth 3 (three) times daily.     cetirizine 10 MG tablet  Commonly known as:  ZYRTEC  Take 10 mg by mouth daily.     Cranberry 200 MG Caps  Take 2 capsules by mouth 2 (two) times daily.     dextromethorphan-guaiFENesin 30-600 MG per 12 hr tablet  Commonly known as:  MUCINEX DM  Take 1 tablet by mouth 2 (two) times daily as needed for cough.     esomeprazole 20 MG capsule  Commonly known as:  NEXIUM  Take 20 mg by mouth 2 (two) times daily before a meal.     ferrous sulfate 325 (65 FE) MG tablet  Take 325 mg by mouth daily with breakfast.     Fish Oil 1200 MG Caps  Take 1,200 mg by mouth daily.     HYDROcodone-acetaminophen 5-325 MG per tablet  Commonly known as:  NORCO/VICODIN  Take one tablet by mouth every 4 to 6 hours as needed for pain. Do not exceed 4gm of Tylenol in 24 hours     ipratropium 0.03 % nasal spray  Commonly known as:  ATROVENT  Place 2 sprays into both nostrils every 12 (twelve) hours.     levothyroxine 50 MCG tablet  Commonly known as:  SYNTHROID, LEVOTHROID  Take 50 mcg by mouth daily.     LORazepam 0.5 MG tablet  Commonly known as:  ATIVAN  Take one  tablet by mouth every night at bedtime for anxiety     Menthol (Topical Analgesic) 4 % Gel  Apply 1 application topically 3 (three) times daily as needed (may leave at bedside).     methocarbamol 500 MG tablet  Commonly known as:  ROBAXIN  Take 500 mg by mouth at bedtime.     metoprolol tartrate 25 MG tablet  Commonly known as:  LOPRESSOR  Take 25 mg by mouth 2 (two) times daily.     multivitamin tablet  Take 1 tablet by mouth daily.     predniSONE 10 MG tablet  Commonly known as:  DELTASONE  Take 10 mg by mouth daily.     promethazine 25 MG tablet  Commonly known as:  PHENERGAN  Take 25 mg by mouth every 6 (six) hours as needed for nausea or vomiting.     rosuvastatin 20 MG tablet  Commonly known as:  CRESTOR  Take 20 mg by mouth every evening.     saccharomyces boulardii 250 MG capsule  Commonly known as:  FLORASTOR  Take 250 mg by mouth 2 (two) times daily.     sertraline 50 MG tablet  Commonly known as:  ZOLOFT  Take 50 mg by mouth every morning.     SYSTANE 0.4-0.3 % Soln  Generic drug:  Polyethyl Glycol-Propyl Glycol  Apply 1 drop to eye 2 (two) times daily.     Vitamin D 2000 UNITS Caps  Take 1 capsule (2,000 Units total) by mouth daily.        Physical Exam  BP 128/66 mmHg  Pulse 68  Temp(Src) 97.4 F (36.3 C)  Resp 19  Ht $R'5\' 6"'Lh$  (1.676 m)  Wt 156 lb 3.2 oz (70.852 kg)  BMI 25.22 kg/m2  Constitutional: WDWN elderly female in no acute distress.  HEENT: Normocephalic and atraumatic. PERRL. EOM intact. No icterus. No nasal discharge or sinus tenderness. Oral mucosa moist. Posterior pharynx clear of any exudate or lesions.  Neck: Supple and nontender. No lymphadenopathy. No  masses or thyromegaly noted. No JVD or carotid bruits. Cardiac: Normal S1, S2. RRR without appreciable murmurs, rubs, or gallops. Distal pulses intact. Trace pitting edema of BLE Lungs: No respiratory distress. Breath sounds clear bilaterally without rales, rhonchi, or  wheezes. Abdomen: Audible bowel sounds in all quadrants. Soft, nontender, nondistended.  Musculoskeletal: able to move extremities. No joint erythema or tenderness. Moderate kyphosis.  Skin: Warm and dry. No rash noted. No erythema.  Neurological: Alert and oriented to person, place, and time.  Psychiatric: Judgment and insight adequate. Appropriate mood and affect.   Labs Reviewed  CBC Latest Ref Rng 07/13/2014 06/09/2014 04/14/2014  WBC - 12.2 12.0 13.9  Hemoglobin 12.0 - 16.0 g/dL 11.7(A) 10.7(A) 11.3(A)  Hematocrit 36 - 46 % 36 34(A) 35(A)  Platelets 150 - 399 K/L 145(A) 142(A) 173    CMP Latest Ref Rng 07/13/2014 06/09/2014 01/10/2014  Glucose 70 - 99 mg/dL - - -  BUN 4 - 21 mg/dL 34(A) 33(A) 32(A)  Creatinine 0.5 - 1.1 mg/dL 1.5(A) 1.5(A) 1.4(A)  Sodium 137 - 147 mmol/L 141 142 140  Potassium 3.4 - 5.3 mmol/L 4.1 4.5 4.5  Chloride 96 - 112 mEq/L - - -  CO2 19 - 32 mEq/L - - -  Calcium 8.4 - 10.5 mg/dL - - -  Total Protein 6.0 - 8.3 g/dL - - -  Total Bilirubin 0.3 - 1.2 mg/dL - - -  Alkaline Phos 25 - 125 U/L - - -  AST 13 - 35 U/L - - -  ALT 7 - 35 U/L - - -    Lab Results  Component Value Date   TSH 1.03 07/06/2014    Lipid Panel     Component Value Date/Time  CHOL 161 07/13/2014   TRIG 252* 07/13/2014   HDL 52 07/13/2014   CHOLHDL 2.9 12/02/2011 0655   VLDL 56* 12/02/2011 0655   LDLCALC 59 07/13/2014   LDLDIRECT 60.7 11/13/2010 1130    Assessment & Plan 1. Essential hypertension Stable. Continue lopressor 25mg  twice daily and monitor  2. Allergic rhinitis, unspecified allergic rhinitis type Stable. Continue zyrtec 10mg  daily and atrovent 0.03% 2 sprays in each nare twice daily.  3. Gastroesophageal reflux disease without esophagitis Stable. Continue nexium 20mg  twice daily and monitor  4. Adrenal insufficiency Stable. Continue prednisone 10mg  daily    5. Hypothyroidism Stable. Recent tsh normal. Continue levothyroxine 74mcg daily   6. RA  (rheumatoid arthritis) Stable. Continue prednisone 10mg  daily and norco 5/325mg  every four to six hours as needed for pain with robaxin 500mg  daily at bedtime.   7. Depression Stable. Continue zoloft 50mg  daily and monitor for change in mood  8. Anemia, iron deficiency Stable. Continue ferrous sulfate 325mg  daily and monitor h&h  9. Anxiety Stable. Continue ativan 0.5mg  daily at bedtime and monitor  10. HLD LDL at goal. Continue crestor 20mg  daily with  fish oil 1200mg  daily, and monitor.   11. Osteoporosis Unable to take bisphosphonate due to severe GERD. Continue oscal 500/200mg  three times daily and vit D 2000iu daily. Continue to monitor   12. CKD stage 3 Stable. Recent eGFR 35. Avoids nephrotoxic drugs, especially NSAIDs. Continue to monitor renal function.   13. Right knee pain Knee x-ray negative for acute findings. Start biofreeze 1 application to Right knee three time daily as needed (may leave at bedside) per resident's request. Continue to monitor.    Family/Staff Communication Plan of care discussed with resident and nursing staff. Resident and nursing staff verbalized understanding and agree with plan of care. No additional questions or concerns reported.    Arthur Holms, MSN, AGNP-C [facility] [address] [place], [place] [address] [phone] [8am-5pm] After hours: [phone]

## 2014-08-17 DIAGNOSIS — M542 Cervicalgia: Secondary | ICD-10-CM | POA: Diagnosis not present

## 2014-09-04 ENCOUNTER — Other Ambulatory Visit: Payer: Self-pay | Admitting: *Deleted

## 2014-09-04 MED ORDER — LORAZEPAM 0.5 MG PO TABS: ORAL_TABLET | ORAL | Status: DC

## 2014-09-05 ENCOUNTER — Non-Acute Institutional Stay (SKILLED_NURSING_FACILITY): Payer: Medicare Other | Admitting: Registered Nurse

## 2014-09-05 DIAGNOSIS — K219 Gastro-esophageal reflux disease without esophagitis: Secondary | ICD-10-CM

## 2014-09-05 DIAGNOSIS — J309 Allergic rhinitis, unspecified: Secondary | ICD-10-CM

## 2014-09-05 DIAGNOSIS — L989 Disorder of the skin and subcutaneous tissue, unspecified: Secondary | ICD-10-CM

## 2014-09-05 DIAGNOSIS — K644 Residual hemorrhoidal skin tags: Secondary | ICD-10-CM

## 2014-09-05 DIAGNOSIS — I10 Essential (primary) hypertension: Secondary | ICD-10-CM

## 2014-09-05 DIAGNOSIS — M810 Age-related osteoporosis without current pathological fracture: Secondary | ICD-10-CM

## 2014-09-05 DIAGNOSIS — D509 Iron deficiency anemia, unspecified: Secondary | ICD-10-CM

## 2014-09-05 DIAGNOSIS — N183 Chronic kidney disease, stage 3 unspecified: Secondary | ICD-10-CM

## 2014-09-05 DIAGNOSIS — E039 Hypothyroidism, unspecified: Secondary | ICD-10-CM

## 2014-09-05 DIAGNOSIS — M069 Rheumatoid arthritis, unspecified: Secondary | ICD-10-CM

## 2014-09-05 DIAGNOSIS — K648 Other hemorrhoids: Secondary | ICD-10-CM | POA: Diagnosis not present

## 2014-09-05 DIAGNOSIS — E785 Hyperlipidemia, unspecified: Secondary | ICD-10-CM

## 2014-09-05 DIAGNOSIS — E2740 Unspecified adrenocortical insufficiency: Secondary | ICD-10-CM | POA: Diagnosis not present

## 2014-09-05 DIAGNOSIS — F418 Other specified anxiety disorders: Secondary | ICD-10-CM

## 2014-09-06 ENCOUNTER — Encounter: Payer: Self-pay | Admitting: Registered Nurse

## 2014-09-06 NOTE — Progress Notes (Signed)
Patient ID: [name], female   DOB: 1929-11-18, 81 y.o.   MRN: [id number]   Place of Service: [facility]  Allergies  Allergen Reactions  . Biphosphate   . Codeine     sick  . Morphine And Related Other (See Comments)    unknown  . Percocet [Oxycodone-Acetaminophen]     unknown  . Plavix [Clopidogrel Bisulfate] Other (See Comments)    unknown  . Sulfur Other (See Comments)    unknown    Code Status: Full Code  Goals of Care: Longevity/LTC  Chief Complaint  Patient presents with  . Medical Management of Chronic Issues    HTN, RA, depression, GERD, osteoporosis, hemorrhoid,     HPI 81 y.o. female with PMH of RA, osteoporosis, adrenal insufficiency, GERD, depression, HTN, AR, hypothyroidism among others is being seen for a routine visit for management of her chronic issues. Weight stable. No skin concerns reported. Has one fall over the past 30 days w/o any apparent injuries. No change in behaviors or functional status reported. No concerns from staff.  HTN adequately controlled on current regimen, has some high readings but average 130s/70-80s. Adrenal insufficiency stable on prednisone. GERD stable on ppi. Mood stable on zoloft. Seen in room today. Reported that hemorrhoid is still bothering here, would like something for pain relief beside preparation H. Also would like a referral to dermatology for a "bump on nose". Denies any other concerns.   Review of Systems Constitutional: Negative for fever, chills, and fatigue. HENT: Negative for ear pain, congestion, and sore throat Eyes: Negative for eye pain, eye discharge, and visual disturbance  Cardiovascular: Negative for chest pain, palpitations, and leg swelling Respiratory: Negative cough, shortness of breath, and wheezing Gastrointestinal: Negative for nausea and vomiting. Negative for abdominal pain, diarrhea and constipation. Positive for GERD Genitourinary: Negative for  dysuria and hematuria Endocrine:  Negative for polydipsia, polyphagia, and polyuria Musculoskeletal: Negative for back pain.  Neurological: Negative for dizziness and headache  Skin: Negative for rash and wound.   Psychiatric: Negative for depression.   Past Medical History  Diagnosis Date  . SOB (shortness of breath)   . MI (myocardial infarction)   . Poor appetite   . Arthritis   . Fatigue   . Right ear pain   . Renal insufficiency   . RA (rheumatoid arthritis)   . PUD (peptic ulcer disease)   . GI bleed 2005  . GERD (gastroesophageal reflux disease)   . Hiatal hernia   . Hypothyroidism   . Osteoporosis   . Depression   . Anxiety   . Coronary artery disease   . CVA (cerebral vascular accident)   . TIA (transient ischemic attack)   . Fall   . DVT of lower extremity (deep venous thrombosis)   . HCAP (healthcare-associated pneumonia) 01/22/2013  . Enteritis due to Clostridium difficile 01/04/2013  . Sepsis 12/19/2012  . Recurrent colitis due to Clostridium difficile 06/02/2012  . Hypertension   . Asthma   . Hyperlipidemia   . Fatigue   . Weight loss   . Hemorrhoids   . Malnutrition   . Dyspnea     Past Surgical History  Procedure Laterality Date  . Nstemi  06/2010  . Spinal fusion surgery    . Knee surgery    . Cardiac catheterization      SHOWED RUPTURE PLAQUE IN THE LAD. THE LAD IS NONOBSTRUCTIVE WITH ONLY 30-40% STENOSIS  . Back surgery    . Breast surgery  1964  x3  . Ankle surgery    . Colonoscopy      History   Social History  . Marital Status: Widowed    Spouse Name: N/A  . Number of Children: N/A  . Years of Education: N/A   Occupational History  . Not on file.   Social History Main Topics  . Smoking status: Never Smoker   . Smokeless tobacco: Never Used  . Alcohol Use: No  . Drug Use: No  . Sexual Activity: No   Other Topics Concern  . Not on file   Social History Narrative   Patient used to work at [employer] for 25 years until she retired   She lives by herself in  [place] until she had a stroke earlier in 2013 June   Her next of kin is her daughter   She does get physical therapy was everyday at [place]      Caffeine use: coffee in the AM    Family History  Problem Relation Age of Onset  . Kidney disease Mother   . Kidney disease Brother   . Lung cancer Father       Medication List       This list is accurate as of: 09/05/14 11:59 PM.  Always use your most recent med list.               albuterol 108 (90 BASE) MCG/ACT inhaler  Commonly known as:  PROVENTIL HFA;VENTOLIN HFA  Inhale 1 puff into the lungs every 6 (six) hours as needed for wheezing or shortness of breath.     aspirin 81 MG chewable tablet  Chew 81 mg by mouth daily.     calcium-vitamin D 500-200 MG-UNIT per tablet  Commonly known as:  OSCAL WITH D  Take 1 tablet by mouth 3 (three) times daily.     cetirizine 10 MG tablet  Commonly known as:  ZYRTEC  Take 10 mg by mouth daily.     Cranberry 200 MG Caps  Take 2 capsules by mouth 2 (two) times daily.     dextromethorphan-guaiFENesin 30-600 MG per 12 hr tablet  Commonly known as:  MUCINEX DM  Take 1 tablet by mouth 2 (two) times daily as needed for cough.     esomeprazole 20 MG capsule  Commonly known as:  NEXIUM  Take 20 mg by mouth 2 (two) times daily before a meal.     ferrous sulfate 325 (65 FE) MG tablet  Take 325 mg by mouth daily with breakfast.     Fish Oil 1200 MG Caps  Take 1,200 mg by mouth daily.     HYDROcodone-acetaminophen 5-325 MG per tablet  Commonly known as:  NORCO/VICODIN  Take one tablet by mouth every 4 to 6 hours as needed for pain. Do not exceed 4gm of Tylenol in 24 hours     ipratropium 0.03 % nasal spray  Commonly known as:  ATROVENT  Place 2 sprays into both nostrils every 12 (twelve) hours.     levothyroxine 50 MCG tablet  Commonly known as:  SYNTHROID, LEVOTHROID  Take 50 mcg by mouth daily.     Lidocaine (Anorectal) 5 % Crea  Apply 1 application  topically 4 (four) times daily as needed.     LORazepam 0.5 MG tablet  Commonly known as:  ATIVAN  Take one tablet by mouth every night at bedtime for anxiety     Menthol (Topical Analgesic) 4 % Gel  Apply 1 application topically  3 (three) times daily as needed (may leave at bedside).     methocarbamol 500 MG tablet  Commonly known as:  ROBAXIN  Take 500 mg by mouth at bedtime.     metoprolol tartrate 25 MG tablet  Commonly known as:  LOPRESSOR  Take 25 mg by mouth 2 (two) times daily.     multivitamin tablet  Take 1 tablet by mouth daily.     predniSONE 10 MG tablet  Commonly known as:  DELTASONE  Take 10 mg by mouth daily.     promethazine 25 MG tablet  Commonly known as:  PHENERGAN  Take 25 mg by mouth every 6 (six) hours as needed for nausea or vomiting.     rosuvastatin 20 MG tablet  Commonly known as:  CRESTOR  Take 20 mg by mouth every evening.     saccharomyces boulardii 250 MG capsule  Commonly known as:  FLORASTOR  Take 250 mg by mouth 2 (two) times daily.     sertraline 50 MG tablet  Commonly known as:  ZOLOFT  Take 50 mg by mouth every morning.     SYSTANE 0.4-0.3 % Soln  Generic drug:  Polyethyl Glycol-Propyl Glycol  Apply 1 drop to eye 2 (two) times daily.     Vitamin D 2000 UNITS Caps  Take 1 capsule (2,000 Units total) by mouth daily.        Physical Exam  BP 135/70 mmHg  Pulse 78  Temp(Src) 97.3 F (36.3 C)  Resp 20  Ht 5\' 6"  (1.676 m)  Wt 158 lb (71.668 kg)  BMI 25.51 kg/m2  Constitutional: WDWN elderly female in no acute distress. Conversant and pleasant HEENT: Normocephalic and atraumatic. PERRL. EOM intact. No icterus. No nasal discharge or sinus tenderness. Oral mucosa moist. Posterior pharynx clear of any exudate or lesions.  Neck: Supple and nontender. No lymphadenopathy. No  masses or thyromegaly noted. No JVD or carotid bruits. Cardiac: Normal S1, S2. RRR without appreciable murmurs, rubs, or gallops. Distal pulses intact.  Trace pitting edema of BLE Lungs: No respiratory distress. Breath sounds clear bilaterally without rales, rhonchi, or wheezes. Abdomen: Audible bowel sounds in all quadrants. Soft, nontender, nondistended.  Musculoskeletal: able to move extremities. No joint erythema or tenderness. Moderate kyphosis.  Skin: Warm and dry. No rash noted. No erythema. No obvious lesion noted on nose but resident is sure that something is there and stated that her dermatologist would know what to do when she sees her.  Neurological: Alert and oriented to person, place, and time.  Psychiatric: Judgment and insight adequate. Appropriate mood and affect.   Labs Reviewed  CBC Latest Ref Rng 07/13/2014 06/09/2014 04/14/2014  WBC - 12.2 12.0 13.9  Hemoglobin 12.0 - 16.0 g/dL 11.7(A) 10.7(A) 11.3(A)  Hematocrit 36 - 46 % 36 34(A) 35(A)  Platelets 150 - 399 K/L 145(A) 142(A) 173    CMP Latest Ref Rng 07/13/2014 06/09/2014 01/10/2014  Glucose 70 - 99 mg/dL - - -  BUN 4 - 21 mg/dL 34(A) 33(A) 32(A)  Creatinine 0.5 - 1.1 mg/dL 1.5(A) 1.5(A) 1.4(A)  Sodium 137 - 147 mmol/L 141 142 140  Potassium 3.4 - 5.3 mmol/L 4.1 4.5 4.5  Chloride 96 - 112 mEq/L - - -  CO2 19 - 32 mEq/L - - -  Calcium 8.4 - 10.5 mg/dL - - -  Total Protein 6.0 - 8.3 g/dL - - -  Total Bilirubin 0.3 - 1.2 mg/dL - - -  Alkaline Phos 25 - 125 U/L - - -  AST 13 - 35 U/L - - -  ALT 7 - 35 U/L - - -    Lab Results  Component Value Date   TSH 1.03 07/06/2014    Lipid Panel     Component Value Date/Time   CHOL 161 07/13/2014   TRIG 252* 07/13/2014   HDL 52 07/13/2014   CHOLHDL 2.9 12/02/2011 0655   VLDL 56* 12/02/2011 0655   LDLCALC 59 07/13/2014   LDLDIRECT 60.7 11/13/2010 1130    Assessment & Plan 1. Essential hypertension Stable. Continue lopressor 25mg  twice daily and monitor  2. Allergic rhinitis, unspecified allergic rhinitis type Stable. Continue zyrtec 10mg  daily and atrovent 0.03% 2 sprays in each nare twice daily.  3.  Gastroesophageal reflux disease without esophagitis No issues. Continue nexium 20mg  twice daily and monitor  4. Adrenal insufficiency Stable. Continue prednisone 10mg  daily    5. Hypothyroidism Stable.Normal TSH. Continue levothyroxine 14mcg daily   6. RA (rheumatoid arthritis) Stable. Continue prednisone 10mg  daily and norco 5/325mg  every four to six hours as needed for pain with robaxin 500mg  daily at bedtime.   7. Depression with anxiety Mood stable. Continue zoloft 50mg  daily with ativan 0.5mg  daily at bedtime and and monitor for change in mood  8. Anemia, iron deficiency Stable. Continue ferrous sulfate 325mg  daily and monitor h&h  9. HLD LDL at goal. Continue crestor 20mg  daily with  fish oil 1200mg  daily   10. Osteoporosis No issues. Unable to take bisphosphonate due to severe GERD. Continue oscal 500/200mg  three times daily and vit D 2000iu daily. Continue to monitor   11. CKD stage 3 Stable. Avoids nephrotoxic drugs, especially NSAIDs. Continue to monitor renal function.   12. Skin lesion Stated that lesion is on tip of left nostril. Unable to see or palpate any lesions. Will initiate dermatology referral per resident's request-resident stated that her dermatologist would know what what she's talking about and what to do.   13. External hemorrhoid D/c preparation H. Start Recticare 5% apply topically four times daily as needed for pain relief. Reassess and continue to monitor.   Family/Staff Communication Plan of care discussed with resident and nursing staff. Resident and nursing staff verbalized understanding and agree with plan of care. No additional questions or concerns reported.    Arthur Holms, MSN, AGNP-C [facility] [address] [place],  [address] [phone] [8am-5pm] After hours: [phone]

## 2014-09-19 DIAGNOSIS — L821 Other seborrheic keratosis: Secondary | ICD-10-CM | POA: Diagnosis not present

## 2014-09-19 DIAGNOSIS — L720 Epidermal cyst: Secondary | ICD-10-CM | POA: Diagnosis not present

## 2014-09-19 DIAGNOSIS — L820 Inflamed seborrheic keratosis: Secondary | ICD-10-CM | POA: Diagnosis not present

## 2014-10-04 ENCOUNTER — Encounter: Payer: Self-pay | Admitting: Registered Nurse

## 2014-10-04 ENCOUNTER — Non-Acute Institutional Stay (SKILLED_NURSING_FACILITY): Payer: Medicare Other | Admitting: Registered Nurse

## 2014-10-04 DIAGNOSIS — K644 Residual hemorrhoidal skin tags: Secondary | ICD-10-CM

## 2014-10-04 DIAGNOSIS — I10 Essential (primary) hypertension: Secondary | ICD-10-CM | POA: Diagnosis not present

## 2014-10-04 DIAGNOSIS — E2740 Unspecified adrenocortical insufficiency: Secondary | ICD-10-CM | POA: Diagnosis not present

## 2014-10-04 DIAGNOSIS — J309 Allergic rhinitis, unspecified: Secondary | ICD-10-CM

## 2014-10-04 DIAGNOSIS — K219 Gastro-esophageal reflux disease without esophagitis: Secondary | ICD-10-CM

## 2014-10-04 DIAGNOSIS — D509 Iron deficiency anemia, unspecified: Secondary | ICD-10-CM

## 2014-10-04 DIAGNOSIS — K648 Other hemorrhoids: Secondary | ICD-10-CM

## 2014-10-04 DIAGNOSIS — M810 Age-related osteoporosis without current pathological fracture: Secondary | ICD-10-CM | POA: Diagnosis not present

## 2014-10-04 DIAGNOSIS — N183 Chronic kidney disease, stage 3 unspecified: Secondary | ICD-10-CM

## 2014-10-04 DIAGNOSIS — E785 Hyperlipidemia, unspecified: Secondary | ICD-10-CM | POA: Diagnosis not present

## 2014-10-04 DIAGNOSIS — Z0000 Encounter for general adult medical examination without abnormal findings: Secondary | ICD-10-CM | POA: Diagnosis not present

## 2014-10-04 DIAGNOSIS — F418 Other specified anxiety disorders: Secondary | ICD-10-CM

## 2014-10-04 DIAGNOSIS — E039 Hypothyroidism, unspecified: Secondary | ICD-10-CM

## 2014-10-04 DIAGNOSIS — M069 Rheumatoid arthritis, unspecified: Secondary | ICD-10-CM

## 2014-10-04 DIAGNOSIS — R1310 Dysphagia, unspecified: Secondary | ICD-10-CM

## 2014-10-04 NOTE — Progress Notes (Signed)
Patient ID: [name], female   DOB: [birth year], 81 y.o.   MRN: [id number]   Place of Service: [facility]  Allergies  Allergen Reactions  . Biphosphate   . Codeine     sick  . Morphine And Related Other (See Comments)    unknown  . Percocet [Oxycodone-Acetaminophen]     unknown  . Plavix [Clopidogrel Bisulfate] Other (See Comments)    unknown  . Sulfur Other (See Comments)    unknown    Code Status: Full Code  Goals of Care: Longevity/LTC  Chief Complaint  Patient presents with  . Annual Exam  . Medical Management of Chronic Issues    HTN, hemorrhoid, osteporosis, GERD, RA, AR, CKD3, depression/anxiety    HPI 81 y.o. female with PMH of RA, osteoporosis, adrenal insufficiency, GERD, depression, HTN, AR, hypothyroidism among others is being seen for an annual visit and routine visit for management of her chronic issues. Weight stable over the past 30 days. No recent fall or skin concerns reported.  No change in behaviors or functional status reported. No concerns from staff. She is up-to-date with influenza and pna vaccines. Also up-to-date with DEXA scan. GERD stable on ppi HTN adequately controlled on current regimen, has some high readings but average 120-130s/60-80s. Adrenal insufficiency stable on prednisone. Mood stable on zoloft. Seen in room today. Reported that hemorrhoid is still bothersome and is having swallowing difficulties but does not want speech evaluation. Stated feeling like something is in her throat. Denies any other concerns.   Review of Systems Constitutional: Negative for fever, chills, and fatigue. HENT: Negative for ear pain, congestion, and sore throat Eyes: Negative for eye pain, eye discharge, and visual disturbance  Cardiovascular: Negative for chest pain, palpitations, and leg swelling Respiratory: Negative cough, shortness of breath, and wheezing Gastrointestinal: Negative for nausea and vomiting. Negative for abdominal pain,  diarrhea and constipation. See HPI Genitourinary: Negative for  dysuria and hematuria Endocrine: Negative for polydipsia, polyphagia, and polyuria Musculoskeletal: Negative for back pain.  Neurological: Negative for dizziness and headache  Skin: Negative for rash and wound.   Psychiatric: Negative for depression.   Past Medical History  Diagnosis Date  . SOB (shortness of breath)   . MI (myocardial infarction)   . Poor appetite   . Arthritis   . Fatigue   . Right ear pain   . Renal insufficiency   . RA (rheumatoid arthritis)   . PUD (peptic ulcer disease)   . GI bleed 2005  . GERD (gastroesophageal reflux disease)   . Hiatal hernia   . Hypothyroidism   . Osteoporosis   . Depression   . Anxiety   . Coronary artery disease   . CVA (cerebral vascular accident)   . TIA (transient ischemic attack)   . Fall   . DVT of lower extremity (deep venous thrombosis)   . HCAP (healthcare-associated pneumonia) 01/22/2013  . Enteritis due to Clostridium difficile 01/04/2013  . Sepsis 12/19/2012  . Recurrent colitis due to Clostridium difficile 06/02/2012  . Hypertension   . Asthma   . Hyperlipidemia   . Fatigue   . Weight loss   . Hemorrhoids   . Malnutrition   . Dyspnea     Past Surgical History  Procedure Laterality Date  . Nstemi  06/2010  . Spinal fusion surgery    . Knee surgery    . Cardiac catheterization      SHOWED RUPTURE PLAQUE IN THE LAD. THE LAD IS NONOBSTRUCTIVE WITH ONLY 30-40%  STENOSIS  . Back surgery    . Breast surgery  1964    x3  . Ankle surgery    . Colonoscopy      History   Social History  . Marital Status: Widowed    Spouse Name: N/A  . Number of Children: N/A  . Years of Education: N/A   Occupational History  . Not on file.   Social History Main Topics  . Smoking status: Never Smoker   . Smokeless tobacco: Never Used  . Alcohol Use: No  . Drug Use: No  . Sexual Activity: No   Other Topics Concern  . Not on file   Social History  Narrative   Patient used to work at [employer] for 25 years until she retired   She lives by herself in [place] until she had a stroke earlier in 2013 June   Her next of kin is her daughter   She does get physical therapy was everyday at [place] [facility]      Caffeine use: coffee in the AM    Family History  Problem Relation Age of Onset  . Kidney disease Mother   . Kidney disease Brother   . Lung cancer Father       Medication List       This list is accurate as of: 10/04/14  5:00 PM.  Always use your most recent med list.               albuterol 108 (90 BASE) MCG/ACT inhaler  Commonly known as:  PROVENTIL HFA;VENTOLIN HFA  Inhale 1 puff into the lungs every 6 (six) hours as needed for wheezing or shortness of breath.     aspirin 81 MG chewable tablet  Chew 81 mg by mouth daily.     calcium-vitamin D 500-200 MG-UNIT per tablet  Commonly known as:  OSCAL WITH D  Take 1 tablet by mouth 3 (three) times daily.     cetirizine 10 MG tablet  Commonly known as:  ZYRTEC  Take 10 mg by mouth daily.     Cranberry 200 MG Caps  Take 2 capsules by mouth 2 (two) times daily.     dextromethorphan-guaiFENesin 30-600 MG per 12 hr tablet  Commonly known as:  MUCINEX DM  Take 1 tablet by mouth 2 (two) times daily as needed for cough.     esomeprazole 20 MG capsule  Commonly known as:  NEXIUM  Take 20 mg by mouth 2 (two) times daily before a meal.     ferrous sulfate 325 (65 FE) MG tablet  Take 325 mg by mouth daily with breakfast.     Fish Oil 1200 MG Caps  Take 1,200 mg by mouth daily.     HYDROcodone-acetaminophen 5-325 MG per tablet  Commonly known as:  NORCO/VICODIN  Take one tablet by mouth every 4 to 6 hours as needed for pain. Do not exceed 4gm of Tylenol in 24 hours     ipratropium 0.03 % nasal spray  Commonly known as:  ATROVENT  Place 2 sprays into both nostrils every 12 (twelve) hours.     levothyroxine 50 MCG tablet  Commonly known as:  SYNTHROID,  LEVOTHROID  Take 50 mcg by mouth daily.     Lidocaine (Anorectal) 5 % Crea  Apply 1 application topically 4 (four) times daily as needed.     LORazepam 0.5 MG tablet  Commonly known as:  ATIVAN  Take one tablet by mouth every night at bedtime  for anxiety     Menthol (Topical Analgesic) 4 % Gel  Apply 1 application topically 3 (three) times daily as needed (may leave at bedside).     methocarbamol 500 MG tablet  Commonly known as:  ROBAXIN  Take 500 mg by mouth at bedtime.     metoprolol tartrate 25 MG tablet  Commonly known as:  LOPRESSOR  Take 25 mg by mouth 2 (two) times daily.     multivitamin tablet  Take 1 tablet by mouth daily.     predniSONE 10 MG tablet  Commonly known as:  DELTASONE  Take 10 mg by mouth daily.     promethazine 25 MG tablet  Commonly known as:  PHENERGAN  Take 25 mg by mouth every 6 (six) hours as needed for nausea or vomiting.     rosuvastatin 20 MG tablet  Commonly known as:  CRESTOR  Take 20 mg by mouth every evening.     saccharomyces boulardii 250 MG capsule  Commonly known as:  FLORASTOR  Take 250 mg by mouth 2 (two) times daily.     sertraline 50 MG tablet  Commonly known as:  ZOLOFT  Take 50 mg by mouth every morning.     SYSTANE 0.4-0.3 % Soln  Generic drug:  Polyethyl Glycol-Propyl Glycol  Apply 1 drop to eye 2 (two) times daily.     Vitamin D 2000 UNITS Caps  Take 1 capsule (2,000 Units total) by mouth daily.        Physical Exam  BP 124/70 mmHg  Pulse 83  Temp(Src) 97.1 F (36.2 C)  Resp 16  Ht 5\' 6"  (1.676 m)  Wt 158 lb (71.668 kg)  BMI 25.51 kg/m2  Constitutional: WDWN elderly female in no acute distress. Conversant and pleasant HEENT: Normocephalic and atraumatic. PERRL. EOM intact. No icterus. No nasal discharge or sinus tenderness. Oral mucosa moist. Posterior pharynx clear of any exudate or lesions.  Neck: Supple and nontender. No lymphadenopathy. No  masses or thyromegaly noted. No JVD or carotid  bruits. Cardiac: Normal S1, S2. RRR without appreciable murmurs, rubs, or gallops. Distal pulses intact. Trace pitting edema of BLE Lungs: Unlabored respirations. Breath sounds clear bilaterally without rales, rhonchi, or wheezes. Abdomen: Audible bowel sounds in all quadrants. Soft, nontender, nondistended.  Musculoskeletal: able to move extremities. No joint erythema or tenderness. Moderate kyphosis.  Skin: Warm and dry. No rash noted. No erythema.  Neurological: Alert and oriented to person, place, and time.  Psychiatric: Judgment and insight adequate. Appropriate mood and affect.   Labs Reviewed  CBC Latest Ref Rng 07/13/2014 06/09/2014 04/14/2014  WBC - 12.2 12.0 13.9  Hemoglobin 12.0 - 16.0 g/dL 11.7(A) 10.7(A) 11.3(A)  Hematocrit 36 - 46 % 36 34(A) 35(A)  Platelets 150 - 399 K/L 145(A) 142(A) 173    CMP Latest Ref Rng 07/13/2014 06/09/2014 01/10/2014  Glucose 70 - 99 mg/dL - - -  BUN 4 - 21 mg/dL 34(A) 33(A) 32(A)  Creatinine 0.5 - 1.1 mg/dL 1.5(A) 1.5(A) 1.4(A)  Sodium 137 - 147 mmol/L 141 142 140  Potassium 3.4 - 5.3 mmol/L 4.1 4.5 4.5  Chloride 96 - 112 mEq/L - - -  CO2 19 - 32 mEq/L - - -  Calcium 8.4 - 10.5 mg/dL - - -  Total Protein 6.0 - 8.3 g/dL - - -  Total Bilirubin 0.3 - 1.2 mg/dL - - -  Alkaline Phos 25 - 125 U/L - - -  AST 13 - 35 U/L - - -  ALT 7 - 35 U/L - - -    Lab Results  Component Value Date   TSH 1.03 07/06/2014    Lipid Panel     Component Value Date/Time   CHOL 161 07/13/2014   TRIG 252* 07/13/2014   HDL 52 07/13/2014   CHOLHDL 2.9 12/02/2011 0655   VLDL 56* 12/02/2011 0655   LDLCALC 59 07/13/2014   LDLDIRECT 60.7 11/13/2010 1130    Assessment & Plan 1. Essential hypertension Adequately controlled with some high readings. Continue lopressor 25mg  twice daily for now. Monitor BP & pulse daily. Reassess and continue to monitor   2. Allergic rhinitis, unspecified allergic rhinitis type Stable. Continue zyrtec 10mg  daily and atrovent  0.03% 2 sprays in each nare twice daily.  3. Gastroesophageal reflux disease without esophagitis Stable. Continue nexium 20mg  twice daily and monitor  4. Adrenal insufficiency Stable. Continue prednisone 10mg  daily    5. Hypothyroidism Normal TSH. Continue levothyroxine 68mcg daily   6. RA (rheumatoid arthritis) Stable. Continue prednisone 10mg  daily and norco 5/325mg  every four to six hours as needed for pain with robaxin 500mg  daily at bedtime.   7. Depression with anxiety Mood stable. Continue zoloft 50mg  daily with ativan 0.5mg  daily at bedtime and and monitor for change in mood  8. Anemia, iron deficiency Stable. Last hgb 11.7. Continue ferrous sulfate 325mg  daily and monitor h&h  9. HLD LDL at goal. Continue crestor 20mg  daily with  fish oil 1200mg  daily   10. Osteoporosis Unable to take bisphosphonate due to severe GERD. Continue oscal 500/200mg  three times daily and vit D 2000iu daily. Continue to monitor   11. CKD stage 3 Stable. Avoids nephrotoxic drugs, especially NSAIDs. Continue to monitor renal function.   12. Swallowing difficulty Declined SLP eval. GI consult for further evaluation. Continue to monitor for now.   13. External hemorrhoid Continue Recticare 5% apply topically four times daily as needed for pain relief. GI consult for possible banding. Patient previously declined GI consult but agrees this time.   51. Annual physical exam Up-to-date with influenza and pna vaccines. Also up-to-date with mammogram and DEXA scan. Declined colonoscopy. Will order FOBT x 3 annually to screen for colon cancer.   Health Promotion  Continue to offer resident recommended immunization and screenings as appropriate for age and health status. Encourage participation in physical activities and interaction with others as tolerated.    Family/Staff Communication Plan of care discussed with resident and nursing staff. Resident and nursing staff verbalized understanding and  agree with plan of care. No additional questions or concerns reported.    Arthur Holms, MSN, AGNP-C [facility] [address] [place], [place] [address] [phone] [8am-5pm] After hours: [phone]

## 2014-10-10 DIAGNOSIS — B351 Tinea unguium: Secondary | ICD-10-CM | POA: Diagnosis not present

## 2014-10-10 DIAGNOSIS — M79674 Pain in right toe(s): Secondary | ICD-10-CM | POA: Diagnosis not present

## 2014-10-10 DIAGNOSIS — M79675 Pain in left toe(s): Secondary | ICD-10-CM | POA: Diagnosis not present

## 2014-10-24 ENCOUNTER — Encounter: Payer: Self-pay | Admitting: Registered Nurse

## 2014-10-24 ENCOUNTER — Non-Acute Institutional Stay (SKILLED_NURSING_FACILITY): Payer: Medicare Other | Admitting: Registered Nurse

## 2014-10-24 DIAGNOSIS — R05 Cough: Secondary | ICD-10-CM

## 2014-10-24 DIAGNOSIS — J309 Allergic rhinitis, unspecified: Secondary | ICD-10-CM | POA: Diagnosis not present

## 2014-10-24 DIAGNOSIS — R059 Cough, unspecified: Secondary | ICD-10-CM

## 2014-10-24 NOTE — Progress Notes (Signed)
Patient ID: [name], female   DOB: January 17, 1930, 81 y.o.   MRN: [id number]   Place of Service: [facility]  Allergies  Allergen Reactions  . Biphosphate   . Codeine     sick  . Morphine And Related Other (See Comments)    unknown  . Percocet [Oxycodone-Acetaminophen]     unknown  . Plavix [Clopidogrel Bisulfate] Other (See Comments)    unknown  . Sulfur Other (See Comments)    unknown    Code Status: Full Code  Goals of Care: Longevity/LTC  Chief Complaint  Patient presents with  . Acute Visit    cough, "head cold"    HPI 81 y.o. female with PMH of RA, osteoporosis, adrenal insufficiency, GERD, depression, HTN, AR, hypothyroidism among others is being seen for acute visit for the evaluation of cough, sore throat, and "head cold." Seen in room today. Reported having non productively cough, sore throat and sinus trouble x 3 days. Stated something needs to be done before she gets bronchitis. Denies any other concerns  Review of Systems Constitutional: Negative for fever and chills HENT: Negative for ear pain. Positive for sore and itchy throat, sneezing, and clear nasal discharge Eyes: Negative for eye pain and eye discharge, and visual disturbance  Cardiovascular: Negative for chest pain Respiratory: Negative cough, shortness of breath, and wheezing Gastrointestinal: Negative for nausea and vomiting. Negative for abdominal pain  Past Medical History  Diagnosis Date  . SOB (shortness of breath)   . MI (myocardial infarction)   . Poor appetite   . Arthritis   . Fatigue   . Right ear pain   . Renal insufficiency   . RA (rheumatoid arthritis)   . PUD (peptic ulcer disease)   . GI bleed 2005  . GERD (gastroesophageal reflux disease)   . Hiatal hernia   . Hypothyroidism   . Osteoporosis   . Depression   . Anxiety   . Coronary artery disease   . CVA (cerebral vascular accident)   . TIA (transient ischemic attack)   . Fall   . DVT of lower extremity  (deep venous thrombosis)   . HCAP (healthcare-associated pneumonia) 01/22/2013  . Enteritis due to Clostridium difficile 01/04/2013  . Sepsis 12/19/2012  . Recurrent colitis due to Clostridium difficile 06/02/2012  . Hypertension   . Asthma   . Hyperlipidemia   . Fatigue   . Weight loss   . Hemorrhoids   . Malnutrition   . Dyspnea     Past Surgical History  Procedure Laterality Date  . Nstemi  06/2010  . Spinal fusion surgery    . Knee surgery    . Cardiac catheterization      SHOWED RUPTURE PLAQUE IN THE LAD. THE LAD IS NONOBSTRUCTIVE WITH ONLY 30-40% STENOSIS  . Back surgery    . Breast surgery  1964    x3  . Ankle surgery    . Colonoscopy      History   Social History  . Marital Status: Widowed    Spouse Name: N/A  . Number of Children: N/A  . Years of Education: N/A   Occupational History  . Not on file.   Social History Main Topics  . Smoking status: Never Smoker   . Smokeless tobacco: Never Used  . Alcohol Use: No  . Drug Use: No  . Sexual Activity: No   Other Topics Concern  . Not on file   Social History Narrative   Patient used to work  at [employer] for 25 years until she retired   She lives by herself in [place] until she had a stroke earlier in 2013 June   Her next of kin is her daughter   She does get physical therapy was everyday at [place] home      Caffeine use: coffee in the AM    Family History  Problem Relation Age of Onset  . Kidney disease Mother   . Kidney disease Brother   . Lung cancer Father       Medication List       This list is accurate as of: 10/24/14  3:28 PM.  Always use your most recent med list.               albuterol 108 (90 BASE) MCG/ACT inhaler  Commonly known as:  PROVENTIL HFA;VENTOLIN HFA  Inhale 1 puff into the lungs every 6 (six) hours as needed for wheezing or shortness of breath.     aspirin 81 MG chewable tablet  Chew 81 mg by mouth daily.     calcium-vitamin D 500-200 MG-UNIT per  tablet  Commonly known as:  OSCAL WITH D  Take 1 tablet by mouth 3 (three) times daily.     cetirizine 10 MG tablet  Commonly known as:  ZYRTEC  Take 10 mg by mouth daily.     Cranberry 200 MG Caps  Take 2 capsules by mouth 2 (two) times daily.     dextromethorphan-guaiFENesin 30-600 MG per 12 hr tablet  Commonly known as:  MUCINEX DM  Take 1 tablet by mouth 2 (two) times daily as needed for cough.     esomeprazole 20 MG capsule  Commonly known as:  NEXIUM  Take 20 mg by mouth 2 (two) times daily before a meal.     ferrous sulfate 325 (65 FE) MG tablet  Take 325 mg by mouth daily with breakfast.     Fish Oil 1200 MG Caps  Take 1,200 mg by mouth daily.     HYDROcodone-acetaminophen 5-325 MG per tablet  Commonly known as:  NORCO/VICODIN  Take one tablet by mouth every 4 to 6 hours as needed for pain. Do not exceed 4gm of Tylenol in 24 hours     ipratropium 0.03 % nasal spray  Commonly known as:  ATROVENT  Place 2 sprays into both nostrils every 12 (twelve) hours.     levothyroxine 50 MCG tablet  Commonly known as:  SYNTHROID, LEVOTHROID  Take 50 mcg by mouth daily.     Lidocaine (Anorectal) 5 % Crea  Apply 1 application topically 4 (four) times daily as needed.     LORazepam 0.5 MG tablet  Commonly known as:  ATIVAN  Take one tablet by mouth every night at bedtime for anxiety     Menthol (Topical Analgesic) 4 % Gel  Apply 1 application topically 3 (three) times daily as needed (may leave at bedside).     methocarbamol 500 MG tablet  Commonly known as:  ROBAXIN  Take 500 mg by mouth at bedtime.     metoprolol tartrate 25 MG tablet  Commonly known as:  LOPRESSOR  Take 25 mg by mouth 2 (two) times daily.     montelukast 10 MG tablet  Commonly known as:  SINGULAIR  Take 10 mg by mouth at bedtime.     multivitamin tablet  Take 1 tablet by mouth daily.     predniSONE 10 MG tablet  Commonly known as:  DELTASONE  Take 10 mg by mouth daily.     promethazine 25  MG tablet  Commonly known as:  PHENERGAN  Take 25 mg by mouth every 6 (six) hours as needed for nausea or vomiting.     rosuvastatin 20 MG tablet  Commonly known as:  CRESTOR  Take 20 mg by mouth every evening.     saccharomyces boulardii 250 MG capsule  Commonly known as:  FLORASTOR  Take 250 mg by mouth 2 (two) times daily.     sertraline 50 MG tablet  Commonly known as:  ZOLOFT  Take 50 mg by mouth every morning.     SYSTANE 0.4-0.3 % Soln  Generic drug:  Polyethyl Glycol-Propyl Glycol  Apply 1 drop to eye 2 (two) times daily.     Vitamin D 2000 UNITS Caps  Take 1 capsule (2,000 Units total) by mouth daily.        Physical Exam  BP 144/78 mmHg  Pulse 76  Temp(Src) 97.3 F (36.3 C)  Resp 16  Constitutional: WDWN elderly female in no acute distress. Conversant and pleasant HEENT: Normocephalic and atraumatic. PERRL. EOM intact. No scleral icterus. No nasal discharge or sinus tenderness. Oral mucosa moist. Posterior pharynx with some clear drainage  Neck: Supple and nontender. Submandibular adenopathy-nontender. No  masses or thyromegaly noted. Cardiac: Normal S1, S2. RRR without appreciable murmurs, rubs, or gallops. Distal pulses intact. Trace pitting edema of BLE Lungs: Unlabored respirations. Breath sounds clear bilaterally without rales, rhonchi, or wheezes. Abdomen: Audible bowel sounds in all quadrants. Soft, nontender, nondistended.  Musculoskeletal: able to move extremities. No joint erythema or tenderness. Moderate kyphosis.  Skin: Warm and dry.  Neurological: Alert and oriented to person, place, and time.  Psychiatric: Judgment and insight adequate. Appropriate mood and affect.   Labs Reviewed  CBC Latest Ref Rng 07/13/2014 06/09/2014 04/14/2014  WBC - 12.2 12.0 13.9  Hemoglobin 12.0 - 16.0 g/dL 11.7(A) 10.7(A) 11.3(A)  Hematocrit 36 - 46 % 36 34(A) 35(A)  Platelets 150 - 399 K/L 145(A) 142(A) 173    CMP Latest Ref Rng 07/13/2014 06/09/2014 01/10/2014    Glucose 70 - 99 mg/dL - - -  BUN 4 - 21 mg/dL 34(A) 33(A) 32(A)  Creatinine 0.5 - 1.1 mg/dL 1.5(A) 1.5(A) 1.4(A)  Sodium 137 - 147 mmol/L 141 142 140  Potassium 3.4 - 5.3 mmol/L 4.1 4.5 4.5  Chloride 96 - 112 mEq/L - - -  CO2 19 - 32 mEq/L - - -  Calcium 8.4 - 10.5 mg/dL - - -  Total Protein 6.0 - 8.3 g/dL - - -  Total Bilirubin 0.3 - 1.2 mg/dL - - -  Alkaline Phos 25 - 125 U/L - - -  AST 13 - 35 U/L - - -  ALT 7 - 35 U/L - - -    Lab Results  Component Value Date   TSH 1.03 07/06/2014    Lipid Panel     Component Value Date/Time   CHOL 161 07/13/2014   TRIG 252* 07/13/2014   HDL 52 07/13/2014   CHOLHDL 2.9 12/02/2011 0655   VLDL 56* 12/02/2011 0655   LDLCALC 59 07/13/2014   LDLDIRECT 60.7 11/13/2010 1130    Assessment & Plan 1. Allergic rhinitis, unspecified allergic rhinitis type Continue zyrtec 10mg  daily and atrovent 0.03% 2 sprays in each nare twice daily. Add singulair 10mg  daily at bedtime. Continue tylenol and chloraseptic spray as needed for sore throat.   2. Cough Continue robitussion dm 46ml every six hours as  needed for cough. Encourage hydration  Family/Staff Communication Plan of care discussed with resident and nursing staff. Resident and nursing staff verbalized understanding and agree with plan of care. No additional questions or concerns reported.    Arthur Holms, MSN, AGNP-C [facility] [address] [place], [place] [address] [phone] [8am-5pm] After hours: [phone]

## 2014-10-27 DIAGNOSIS — R062 Wheezing: Secondary | ICD-10-CM | POA: Diagnosis not present

## 2014-10-29 ENCOUNTER — Emergency Department (HOSPITAL_COMMUNITY): Payer: Medicare Other

## 2014-10-29 ENCOUNTER — Inpatient Hospital Stay (HOSPITAL_COMMUNITY)
Admission: EM | Admit: 2014-10-29 | Discharge: 2014-11-06 | DRG: 871 | Disposition: A | Discharge status: Skilled Nursing Facility | Payer: Medicare Other | Attending: Internal Medicine | Admitting: Internal Medicine

## 2014-10-29 ENCOUNTER — Encounter (HOSPITAL_COMMUNITY): Payer: Self-pay | Admitting: Emergency Medicine

## 2014-10-29 DIAGNOSIS — J189 Pneumonia, unspecified organism: Secondary | ICD-10-CM | POA: Diagnosis not present

## 2014-10-29 DIAGNOSIS — R05 Cough: Secondary | ICD-10-CM

## 2014-10-29 DIAGNOSIS — Z79899 Other long term (current) drug therapy: Secondary | ICD-10-CM

## 2014-10-29 DIAGNOSIS — R0902 Hypoxemia: Secondary | ICD-10-CM | POA: Diagnosis present

## 2014-10-29 DIAGNOSIS — J45909 Unspecified asthma, uncomplicated: Secondary | ICD-10-CM | POA: Diagnosis present

## 2014-10-29 DIAGNOSIS — D509 Iron deficiency anemia, unspecified: Secondary | ICD-10-CM | POA: Diagnosis present

## 2014-10-29 DIAGNOSIS — M069 Rheumatoid arthritis, unspecified: Secondary | ICD-10-CM | POA: Diagnosis not present

## 2014-10-29 DIAGNOSIS — D631 Anemia in chronic kidney disease: Secondary | ICD-10-CM | POA: Diagnosis present

## 2014-10-29 DIAGNOSIS — E785 Hyperlipidemia, unspecified: Secondary | ICD-10-CM | POA: Diagnosis present

## 2014-10-29 DIAGNOSIS — Z7952 Long term (current) use of systemic steroids: Secondary | ICD-10-CM | POA: Diagnosis not present

## 2014-10-29 DIAGNOSIS — I248 Other forms of acute ischemic heart disease: Secondary | ICD-10-CM | POA: Diagnosis present

## 2014-10-29 DIAGNOSIS — R2681 Unsteadiness on feet: Secondary | ICD-10-CM | POA: Diagnosis not present

## 2014-10-29 DIAGNOSIS — M810 Age-related osteoporosis without current pathological fracture: Secondary | ICD-10-CM | POA: Diagnosis present

## 2014-10-29 DIAGNOSIS — K920 Hematemesis: Secondary | ICD-10-CM | POA: Diagnosis present

## 2014-10-29 DIAGNOSIS — Z885 Allergy status to narcotic agent status: Secondary | ICD-10-CM

## 2014-10-29 DIAGNOSIS — Y95 Nosocomial condition: Secondary | ICD-10-CM | POA: Diagnosis present

## 2014-10-29 DIAGNOSIS — R7989 Other specified abnormal findings of blood chemistry: Secondary | ICD-10-CM | POA: Diagnosis present

## 2014-10-29 DIAGNOSIS — R6520 Severe sepsis without septic shock: Secondary | ICD-10-CM | POA: Diagnosis present

## 2014-10-29 DIAGNOSIS — Z79891 Long term (current) use of opiate analgesic: Secondary | ICD-10-CM | POA: Diagnosis not present

## 2014-10-29 DIAGNOSIS — J9811 Atelectasis: Secondary | ICD-10-CM | POA: Diagnosis present

## 2014-10-29 DIAGNOSIS — J810 Acute pulmonary edema: Secondary | ICD-10-CM | POA: Diagnosis not present

## 2014-10-29 DIAGNOSIS — J9601 Acute respiratory failure with hypoxia: Secondary | ICD-10-CM | POA: Diagnosis not present

## 2014-10-29 DIAGNOSIS — I2510 Atherosclerotic heart disease of native coronary artery without angina pectoris: Secondary | ICD-10-CM | POA: Diagnosis not present

## 2014-10-29 DIAGNOSIS — M1990 Unspecified osteoarthritis, unspecified site: Secondary | ICD-10-CM | POA: Diagnosis present

## 2014-10-29 DIAGNOSIS — N183 Chronic kidney disease, stage 3 unspecified: Secondary | ICD-10-CM | POA: Diagnosis present

## 2014-10-29 DIAGNOSIS — E876 Hypokalemia: Secondary | ICD-10-CM | POA: Diagnosis present

## 2014-10-29 DIAGNOSIS — R059 Cough, unspecified: Secondary | ICD-10-CM

## 2014-10-29 DIAGNOSIS — J80 Acute respiratory distress syndrome: Secondary | ICD-10-CM | POA: Diagnosis not present

## 2014-10-29 DIAGNOSIS — Z801 Family history of malignant neoplasm of trachea, bronchus and lung: Secondary | ICD-10-CM | POA: Diagnosis not present

## 2014-10-29 DIAGNOSIS — I129 Hypertensive chronic kidney disease with stage 1 through stage 4 chronic kidney disease, or unspecified chronic kidney disease: Secondary | ICD-10-CM | POA: Diagnosis present

## 2014-10-29 DIAGNOSIS — K2970 Gastritis, unspecified, without bleeding: Secondary | ICD-10-CM | POA: Diagnosis present

## 2014-10-29 DIAGNOSIS — R1310 Dysphagia, unspecified: Secondary | ICD-10-CM | POA: Diagnosis present

## 2014-10-29 DIAGNOSIS — E860 Dehydration: Secondary | ICD-10-CM | POA: Diagnosis present

## 2014-10-29 DIAGNOSIS — M6281 Muscle weakness (generalized): Secondary | ICD-10-CM | POA: Diagnosis not present

## 2014-10-29 DIAGNOSIS — I313 Pericardial effusion (noninflammatory): Secondary | ICD-10-CM | POA: Diagnosis present

## 2014-10-29 DIAGNOSIS — D72829 Elevated white blood cell count, unspecified: Secondary | ICD-10-CM | POA: Diagnosis not present

## 2014-10-29 DIAGNOSIS — Z8673 Personal history of transient ischemic attack (TIA), and cerebral infarction without residual deficits: Secondary | ICD-10-CM

## 2014-10-29 DIAGNOSIS — Z981 Arthrodesis status: Secondary | ICD-10-CM

## 2014-10-29 DIAGNOSIS — Z86718 Personal history of other venous thrombosis and embolism: Secondary | ICD-10-CM

## 2014-10-29 DIAGNOSIS — R079 Chest pain, unspecified: Secondary | ICD-10-CM | POA: Diagnosis not present

## 2014-10-29 DIAGNOSIS — N184 Chronic kidney disease, stage 4 (severe): Secondary | ICD-10-CM | POA: Diagnosis present

## 2014-10-29 DIAGNOSIS — K449 Diaphragmatic hernia without obstruction or gangrene: Secondary | ICD-10-CM | POA: Diagnosis present

## 2014-10-29 DIAGNOSIS — R778 Other specified abnormalities of plasma proteins: Secondary | ICD-10-CM | POA: Diagnosis present

## 2014-10-29 DIAGNOSIS — I130 Hypertensive heart and chronic kidney disease with heart failure and stage 1 through stage 4 chronic kidney disease, or unspecified chronic kidney disease: Secondary | ICD-10-CM | POA: Diagnosis present

## 2014-10-29 DIAGNOSIS — I5033 Acute on chronic diastolic (congestive) heart failure: Secondary | ICD-10-CM | POA: Diagnosis present

## 2014-10-29 DIAGNOSIS — Z888 Allergy status to other drugs, medicaments and biological substances status: Secondary | ICD-10-CM | POA: Diagnosis not present

## 2014-10-29 DIAGNOSIS — Z8711 Personal history of peptic ulcer disease: Secondary | ICD-10-CM | POA: Diagnosis not present

## 2014-10-29 DIAGNOSIS — E039 Hypothyroidism, unspecified: Secondary | ICD-10-CM | POA: Diagnosis present

## 2014-10-29 DIAGNOSIS — A419 Sepsis, unspecified organism: Principal | ICD-10-CM | POA: Diagnosis present

## 2014-10-29 DIAGNOSIS — N390 Urinary tract infection, site not specified: Secondary | ICD-10-CM

## 2014-10-29 DIAGNOSIS — K210 Gastro-esophageal reflux disease with esophagitis: Secondary | ICD-10-CM | POA: Diagnosis present

## 2014-10-29 DIAGNOSIS — Z882 Allergy status to sulfonamides status: Secondary | ICD-10-CM

## 2014-10-29 DIAGNOSIS — I5030 Unspecified diastolic (congestive) heart failure: Secondary | ICD-10-CM

## 2014-10-29 DIAGNOSIS — J188 Other pneumonia, unspecified organism: Secondary | ICD-10-CM | POA: Diagnosis not present

## 2014-10-29 DIAGNOSIS — Z7982 Long term (current) use of aspirin: Secondary | ICD-10-CM

## 2014-10-29 DIAGNOSIS — E872 Acidosis: Secondary | ICD-10-CM | POA: Diagnosis present

## 2014-10-29 DIAGNOSIS — R1312 Dysphagia, oropharyngeal phase: Secondary | ICD-10-CM | POA: Diagnosis not present

## 2014-10-29 DIAGNOSIS — R0602 Shortness of breath: Secondary | ICD-10-CM | POA: Diagnosis not present

## 2014-10-29 DIAGNOSIS — N179 Acute kidney failure, unspecified: Secondary | ICD-10-CM | POA: Diagnosis present

## 2014-10-29 DIAGNOSIS — I252 Old myocardial infarction: Secondary | ICD-10-CM

## 2014-10-29 DIAGNOSIS — R262 Difficulty in walking, not elsewhere classified: Secondary | ICD-10-CM | POA: Diagnosis not present

## 2014-10-29 DIAGNOSIS — M62838 Other muscle spasm: Secondary | ICD-10-CM | POA: Diagnosis not present

## 2014-10-29 DIAGNOSIS — J40 Bronchitis, not specified as acute or chronic: Secondary | ICD-10-CM | POA: Diagnosis not present

## 2014-10-29 LAB — COMPREHENSIVE METABOLIC PANEL
ALBUMIN: 3.3 g/dL — AB (ref 3.5–5.0)
ALK PHOS: 47 U/L (ref 38–126)
ALT: 19 U/L (ref 14–54)
ANION GAP: 6 (ref 5–15)
AST: 39 U/L (ref 15–41)
BILIRUBIN TOTAL: 1.5 mg/dL — AB (ref 0.3–1.2)
BUN: 17 mg/dL (ref 6–20)
CO2: 23 mmol/L (ref 22–32)
Calcium: 8.5 mg/dL — ABNORMAL LOW (ref 8.9–10.3)
Chloride: 109 mmol/L (ref 101–111)
Creatinine, Ser: 1.19 mg/dL — ABNORMAL HIGH (ref 0.44–1.00)
GFR calc Af Amer: 48 mL/min — ABNORMAL LOW (ref >60)
GFR calc non Af Amer: 41 mL/min — ABNORMAL LOW (ref >60)
Glucose, Bld: 133 mg/dL — ABNORMAL HIGH (ref 70–99)
POTASSIUM: 4.4 mmol/L (ref 3.5–5.1)
Sodium: 138 mmol/L (ref 135–145)
TOTAL PROTEIN: 6.3 g/dL — AB (ref 6.5–8.1)

## 2014-10-29 LAB — PROCALCITONIN: Procalcitonin: 0.21 ng/mL

## 2014-10-29 LAB — BLOOD GAS, ARTERIAL
ACID-BASE DEFICIT: 2.7 mmol/L — AB (ref 0.0–2.0)
BICARBONATE: 22.3 meq/L (ref 20.0–24.0)
Drawn by: 331471
O2 CONTENT: 8 L/min
O2 Saturation: 98.1 %
PCO2 ART: 41.8 mmHg (ref 35.0–45.0)
Patient temperature: 98.6
TCO2: 20.4 mmol/L (ref 0–100)
pH, Arterial: 7.346 — ABNORMAL LOW (ref 7.350–7.450)
pO2, Arterial: 142 mmHg — ABNORMAL HIGH (ref 80.0–100.0)

## 2014-10-29 LAB — CBC WITH DIFFERENTIAL/PLATELET
Basophils Absolute: 0 10*3/uL (ref 0.0–0.1)
Basophils Relative: 0 % (ref 0–1)
Eosinophils Absolute: 0.1 10*3/uL (ref 0.0–0.7)
Eosinophils Relative: 1 % (ref 0–5)
HEMATOCRIT: 38.2 % (ref 36.0–46.0)
Hemoglobin: 11.8 g/dL — ABNORMAL LOW (ref 12.0–15.0)
Lymphocytes Relative: 11 % — ABNORMAL LOW (ref 12–46)
Lymphs Abs: 1.6 10*3/uL (ref 0.7–4.0)
MCH: 28 pg (ref 26.0–34.0)
MCHC: 30.9 g/dL (ref 30.0–36.0)
MCV: 90.5 fL (ref 78.0–100.0)
MONO ABS: 1 10*3/uL (ref 0.1–1.0)
Monocytes Relative: 7 % (ref 3–12)
Neutro Abs: 11.4 10*3/uL — ABNORMAL HIGH (ref 1.7–7.7)
Neutrophils Relative %: 81 % — ABNORMAL HIGH (ref 43–77)
PLATELETS: 110 10*3/uL — AB (ref 150–400)
RBC: 4.22 MIL/uL (ref 3.87–5.11)
RDW: 15.7 % — AB (ref 11.5–15.5)
WBC: 14.1 10*3/uL — ABNORMAL HIGH (ref 4.0–10.5)

## 2014-10-29 LAB — LACTIC ACID, PLASMA
LACTIC ACID, VENOUS: 5.7 mmol/L — AB (ref 0.5–2.0)
Lactic Acid, Venous: 8.7 mmol/L (ref 0.5–2.0)

## 2014-10-29 LAB — I-STAT CG4 LACTIC ACID, ED: LACTIC ACID, VENOUS: 2.35 mmol/L — AB (ref 0.5–2.0)

## 2014-10-29 LAB — PHOSPHORUS: Phosphorus: 4.8 mg/dL — ABNORMAL HIGH (ref 2.5–4.6)

## 2014-10-29 LAB — INFLUENZA PANEL BY PCR (TYPE A & B)
H1N1FLUPCR: NOT DETECTED
Influenza A By PCR: NEGATIVE
Influenza B By PCR: NEGATIVE

## 2014-10-29 LAB — MAGNESIUM: MAGNESIUM: 1.9 mg/dL (ref 1.7–2.4)

## 2014-10-29 LAB — BRAIN NATRIURETIC PEPTIDE: B Natriuretic Peptide: 1045.3 pg/mL — ABNORMAL HIGH (ref 0.0–100.0)

## 2014-10-29 LAB — PROTIME-INR
INR: 1.02 (ref 0.00–1.49)
PROTHROMBIN TIME: 13.6 s (ref 11.6–15.2)

## 2014-10-29 LAB — STREP PNEUMONIAE URINARY ANTIGEN: Strep Pneumo Urinary Antigen: NEGATIVE

## 2014-10-29 LAB — MRSA PCR SCREENING: MRSA by PCR: NEGATIVE

## 2014-10-29 LAB — TROPONIN I: Troponin I: 0.15 ng/mL — ABNORMAL HIGH (ref <0.031)

## 2014-10-29 MED ORDER — IPRATROPIUM BROMIDE 0.03 % NA SOLN
2.0000 | Freq: Two times a day (BID) | NASAL | Status: DC
  Administered 2014-10-30 – 2014-11-01 (×6): 2 via NASAL
  Filled 2014-10-29: qty 30

## 2014-10-29 MED ORDER — MENTHOL (TOPICAL ANALGESIC) 4 % EX GEL: 1.0000 "application " | Freq: Three times a day (TID) | CUTANEOUS | Status: DC | PRN

## 2014-10-29 MED ORDER — LEVOTHYROXINE SODIUM 25 MCG PO TABS
50.0000 ug | ORAL_TABLET | Freq: Every day | ORAL | Status: DC
  Administered 2014-10-30 – 2014-11-06 (×8): 50 ug via ORAL
  Filled 2014-10-29 (×3): qty 1
  Filled 2014-10-29: qty 2
  Filled 2014-10-29: qty 1
  Filled 2014-10-29: qty 2
  Filled 2014-10-29: qty 1
  Filled 2014-10-29: qty 2

## 2014-10-29 MED ORDER — VANCOMYCIN HCL IN DEXTROSE 1-5 GM/200ML-% IV SOLN
1000.0000 mg | INTRAVENOUS | Status: DC
  Administered 2014-10-30 – 2014-11-02 (×4): 1000 mg via INTRAVENOUS
  Filled 2014-10-29 (×4): qty 200

## 2014-10-29 MED ORDER — LORAZEPAM 0.5 MG PO TABS
0.5000 mg | ORAL_TABLET | Freq: Every day | ORAL | Status: DC
  Administered 2014-10-29 – 2014-11-05 (×8): 0.5 mg via ORAL
  Filled 2014-10-29 (×9): qty 1

## 2014-10-29 MED ORDER — SODIUM CHLORIDE 0.9 % IV SOLN
INTRAVENOUS | Status: AC
  Administered 2014-10-29: 14:00:00 via INTRAVENOUS

## 2014-10-29 MED ORDER — DEXTROSE 5 % IV SOLN: 1.0000 g | INTRAVENOUS | Status: DC

## 2014-10-29 MED ORDER — MONTELUKAST SODIUM 10 MG PO TABS
10.0000 mg | ORAL_TABLET | Freq: Every day | ORAL | Status: DC
  Administered 2014-10-29 – 2014-11-05 (×8): 10 mg via ORAL
  Filled 2014-10-29 (×8): qty 1

## 2014-10-29 MED ORDER — LEVALBUTEROL HCL 1.25 MG/0.5ML IN NEBU
1.2500 mg | INHALATION_SOLUTION | RESPIRATORY_TRACT | Status: DC | PRN
  Administered 2014-11-01: 1.25 mg via RESPIRATORY_TRACT
  Filled 2014-10-29: qty 0.5

## 2014-10-29 MED ORDER — ASPIRIN 81 MG PO CHEW
324.0000 mg | CHEWABLE_TABLET | Freq: Once | ORAL | Status: AC
Start: 2014-10-29 — End: 2014-10-29
  Administered 2014-10-29: 324 mg via ORAL
  Filled 2014-10-29: qty 4

## 2014-10-29 MED ORDER — VANCOMYCIN HCL IN DEXTROSE 1-5 GM/200ML-% IV SOLN: 1000.0000 mg | INTRAVENOUS | Status: DC

## 2014-10-29 MED ORDER — DM-GUAIFENESIN ER 30-600 MG PO TB12
1.0000 | ORAL_TABLET | Freq: Two times a day (BID) | ORAL | Status: DC | PRN
  Administered 2014-10-30 – 2014-11-04 (×7): 1 via ORAL
  Filled 2014-10-29 (×13): qty 1

## 2014-10-29 MED ORDER — FERROUS SULFATE 325 (65 FE) MG PO TABS
325.0000 mg | ORAL_TABLET | Freq: Every day | ORAL | Status: DC
  Administered 2014-10-30 – 2014-11-06 (×8): 325 mg via ORAL
  Filled 2014-10-29 (×8): qty 1

## 2014-10-29 MED ORDER — SACCHAROMYCES BOULARDII 250 MG PO CAPS
250.0000 mg | ORAL_CAPSULE | Freq: Two times a day (BID) | ORAL | Status: DC
  Administered 2014-10-29 – 2014-11-06 (×16): 250 mg via ORAL
  Filled 2014-10-29 (×16): qty 1

## 2014-10-29 MED ORDER — ALBUTEROL (5 MG/ML) CONTINUOUS INHALATION SOLN
10.0000 mg/h | INHALATION_SOLUTION | RESPIRATORY_TRACT | Status: DC
Start: 2014-10-29 — End: 2014-10-29
  Administered 2014-10-29: 10 mg/h via RESPIRATORY_TRACT

## 2014-10-29 MED ORDER — FUROSEMIDE 10 MG/ML IJ SOLN
40.0000 mg | Freq: Once | INTRAMUSCULAR | Status: AC
  Administered 2014-10-29: 40 mg via INTRAVENOUS
  Filled 2014-10-29: qty 4

## 2014-10-29 MED ORDER — DEXTROSE 5 % IV SOLN: 2.0000 g | Freq: Once | INTRAVENOUS | Status: DC

## 2014-10-29 MED ORDER — VANCOMYCIN HCL IN DEXTROSE 1-5 GM/200ML-% IV SOLN: 1000.0000 mg | Freq: Once | INTRAVENOUS | Status: DC

## 2014-10-29 MED ORDER — DEXTROSE 5 % IV SOLN
1.0000 g | INTRAVENOUS | Status: DC
  Administered 2014-10-30 – 2014-11-02 (×4): 1 g via INTRAVENOUS
  Filled 2014-10-29 (×5): qty 1

## 2014-10-29 MED ORDER — METOPROLOL TARTRATE 25 MG PO TABS
25.0000 mg | ORAL_TABLET | Freq: Two times a day (BID) | ORAL | Status: DC
  Administered 2014-10-29 – 2014-11-06 (×16): 25 mg via ORAL
  Filled 2014-10-29 (×16): qty 1

## 2014-10-29 MED ORDER — PROMETHAZINE HCL 25 MG PO TABS
12.5000 mg | ORAL_TABLET | Freq: Four times a day (QID) | ORAL | Status: DC | PRN
  Administered 2014-10-29 – 2014-11-01 (×5): 12.5 mg via ORAL
  Filled 2014-10-29 (×6): qty 1

## 2014-10-29 MED ORDER — POLYVINYL ALCOHOL 1.4 % OP SOLN
1.0000 [drp] | OPHTHALMIC | Status: DC | PRN
  Filled 2014-10-29: qty 15

## 2014-10-29 MED ORDER — CAMPHOR-MENTHOL 0.5-0.5 % EX LOTN: TOPICAL_LOTION | CUTANEOUS | Status: DC | PRN

## 2014-10-29 MED ORDER — IPRATROPIUM BROMIDE 0.02 % IN SOLN
0.5000 mg | RESPIRATORY_TRACT | Status: DC
  Administered 2014-10-29 (×2): 0.5 mg via RESPIRATORY_TRACT
  Filled 2014-10-29 (×2): qty 2.5

## 2014-10-29 MED ORDER — LORATADINE 10 MG PO TABS
10.0000 mg | ORAL_TABLET | Freq: Every day | ORAL | Status: DC
  Administered 2014-10-30 – 2014-11-01 (×3): 10 mg via ORAL
  Filled 2014-10-29 (×3): qty 1

## 2014-10-29 MED ORDER — IPRATROPIUM BROMIDE 0.02 % IN SOLN
0.5000 mg | Freq: Once | RESPIRATORY_TRACT | Status: AC
  Administered 2014-10-29: 0.5 mg via RESPIRATORY_TRACT

## 2014-10-29 MED ORDER — SODIUM CHLORIDE 0.9 % IV BOLUS (SEPSIS)
1000.0000 mL | Freq: Once | INTRAVENOUS | Status: AC
  Administered 2014-10-29: 1000 mL via INTRAVENOUS

## 2014-10-29 MED ORDER — ROSUVASTATIN CALCIUM 20 MG PO TABS
20.0000 mg | ORAL_TABLET | Freq: Every evening | ORAL | Status: DC
  Administered 2014-10-29 – 2014-11-05 (×8): 20 mg via ORAL
  Filled 2014-10-29 (×8): qty 1

## 2014-10-29 MED ORDER — CALCIUM CARBONATE-VITAMIN D 500-200 MG-UNIT PO TABS
1.0000 | ORAL_TABLET | Freq: Three times a day (TID) | ORAL | Status: DC
  Administered 2014-10-29 – 2014-11-01 (×10): 1 via ORAL
  Filled 2014-10-29 (×10): qty 1

## 2014-10-29 MED ORDER — DEXTROSE 5 % IV SOLN
2.0000 g | Freq: Once | INTRAVENOUS | Status: AC
  Administered 2014-10-29: 2 g via INTRAVENOUS
  Filled 2014-10-29: qty 2

## 2014-10-29 MED ORDER — ALBUTEROL (5 MG/ML) CONTINUOUS INHALATION SOLN
INHALATION_SOLUTION | RESPIRATORY_TRACT | Status: AC
  Filled 2014-10-29: qty 20

## 2014-10-29 MED ORDER — SERTRALINE HCL 50 MG PO TABS
50.0000 mg | ORAL_TABLET | Freq: Every morning | ORAL | Status: DC
  Administered 2014-10-30 – 2014-11-06 (×8): 50 mg via ORAL
  Filled 2014-10-29 (×9): qty 1

## 2014-10-29 MED ORDER — METHYLPREDNISOLONE SODIUM SUCC 125 MG IJ SOLR
60.0000 mg | Freq: Two times a day (BID) | INTRAMUSCULAR | Status: DC
  Administered 2014-10-29: 60 mg via INTRAVENOUS
  Filled 2014-10-29: qty 2

## 2014-10-29 MED ORDER — ACETAMINOPHEN 500 MG PO TABS
1000.0000 mg | ORAL_TABLET | Freq: Once | ORAL | Status: AC
  Administered 2014-10-29: 1000 mg via ORAL
  Filled 2014-10-29: qty 2

## 2014-10-29 MED ORDER — METHOCARBAMOL 500 MG PO TABS
500.0000 mg | ORAL_TABLET | Freq: Every day | ORAL | Status: DC
  Administered 2014-10-29 – 2014-11-05 (×8): 500 mg via ORAL
  Filled 2014-10-29 (×8): qty 1

## 2014-10-29 MED ORDER — VANCOMYCIN HCL IN DEXTROSE 1-5 GM/200ML-% IV SOLN
1000.0000 mg | Freq: Once | INTRAVENOUS | Status: AC
  Administered 2014-10-29: 1000 mg via INTRAVENOUS
  Filled 2014-10-29: qty 200

## 2014-10-29 MED ORDER — LEVOTHYROXINE SODIUM 50 MCG PO TABS: 50.0000 ug | ORAL_TABLET | Freq: Every day | ORAL | Status: DC

## 2014-10-29 MED ORDER — ASPIRIN 81 MG PO CHEW
81.0000 mg | CHEWABLE_TABLET | Freq: Every day | ORAL | Status: DC
  Filled 2014-10-29: qty 1

## 2014-10-29 MED ORDER — IPRATROPIUM BROMIDE 0.02 % IN SOLN
0.5000 mg | RESPIRATORY_TRACT | Status: DC | PRN
  Filled 2014-10-29: qty 2.5

## 2014-10-29 MED ORDER — VITAMIN D3 25 MCG (1000 UNIT) PO TABS
2000.0000 [IU] | ORAL_TABLET | Freq: Every day | ORAL | Status: DC
  Administered 2014-10-30 – 2014-11-01 (×3): 2000 [IU] via ORAL
  Filled 2014-10-29 (×7): qty 2

## 2014-10-29 MED ORDER — PANTOPRAZOLE SODIUM 40 MG PO TBEC
40.0000 mg | DELAYED_RELEASE_TABLET | Freq: Every day | ORAL | Status: DC
  Filled 2014-10-29: qty 1

## 2014-10-29 MED ORDER — ADULT MULTIVITAMIN W/MINERALS CH
1.0000 | ORAL_TABLET | Freq: Every day | ORAL | Status: DC
  Administered 2014-10-30 – 2014-11-01 (×3): 1 via ORAL
  Filled 2014-10-29 (×7): qty 1

## 2014-10-29 MED ORDER — LEVALBUTEROL HCL 1.25 MG/0.5ML IN NEBU
1.2500 mg | INHALATION_SOLUTION | RESPIRATORY_TRACT | Status: DC
  Administered 2014-10-29 (×2): 1.25 mg via RESPIRATORY_TRACT
  Filled 2014-10-29 (×2): qty 0.5

## 2014-10-29 MED ORDER — POLYETHYL GLYCOL-PROPYL GLYCOL 0.4-0.3 % OP SOLN: 1.0000 [drp] | Freq: Two times a day (BID) | OPHTHALMIC | Status: DC

## 2014-10-29 MED ORDER — LORAZEPAM 2 MG/ML IJ SOLN
0.5000 mg | Freq: Once | INTRAMUSCULAR | Status: AC
  Administered 2014-10-29: 0.5 mg via INTRAVENOUS
  Filled 2014-10-29: qty 1

## 2014-10-29 NOTE — Progress Notes (Addendum)
CRITICAL VALUE ALERT  Critical value received:  Lactic Acid  Date of notification:  10/29/2014  Time of notification:  3754  Critical value read back:Yes.    Nurse who received alert:  Avon Gully, RN  MD notified (1st page):  Dr. Charlies Silvers  Time of first page:  1810  Responding MD:  Awaiting response  Time MD responded:  [phone]

## 2014-10-29 NOTE — ED Notes (Signed)
Per EMS pt comes from [facility] for resp distress.  Pt was given in route:  1122mL NS, 15mg  albuterol, 1mg  atrovent, 125mg  Solu-medrol, 1mg  rasemic epi neb, 2g mag IV, 8mg  Zofran.   Pt wheezing and rhonci.  Pt has 18g in left and right hands.   Pt was tachy 120s and O2 80% upon there arrival to scene.  BP 160/90.  Pt has cough since Tuesday and was told her chest xray was "normal".

## 2014-10-29 NOTE — ED Provider Notes (Signed)
CSN: [id number]     Arrival date & time 10/29/14  1007 History   First MD Initiated Contact with Patient 10/29/14 1009     Chief Complaint  Patient presents with  . Respiratory Distress     (Consider location/radiation/quality/duration/timing/severity/associated sxs/prior Treatment) HPI  81 year old female brought in by EMS from assisted living with shortness of breath. Has been having short of breath, cough, and fever for the past 3-4 days. EMS found the patient hypoxic at 80% with severe respiratory distress. Decreased breath sounds per them, she has now received 15 mg albuterol, 1 mg Atrovent, 125 mg solumedrol, and 2 g magnesium. The patient has significantly opened up but still appears in respiratory distress. The patient apparently had a negative chest x-ray at her facility. She is not currently on antibiotics. No known history of COPD. The patient denies a leg swelling or leg pain. No chest pain. Patient was not placed on CPAP because she has vomited twice and been given 8 mg zofran IV. EMS talked to the patient about possible intubation and she said she would not want this. When I bring up the topic she states that she needs to "think about it".   Past Medical History  Diagnosis Date  . SOB (shortness of breath)   . MI (myocardial infarction)   . Poor appetite   . Arthritis   . Fatigue   . Right ear pain   . Renal insufficiency   . RA (rheumatoid arthritis)   . PUD (peptic ulcer disease)   . GI bleed 2005  . GERD (gastroesophageal reflux disease)   . Hiatal hernia   . Hypothyroidism   . Osteoporosis   . Depression   . Anxiety   . Coronary artery disease   . CVA (cerebral vascular accident)   . TIA (transient ischemic attack)   . Fall   . DVT of lower extremity (deep venous thrombosis)   . HCAP (healthcare-associated pneumonia) 01/22/2013  . Enteritis due to Clostridium difficile 01/04/2013  . Sepsis 12/19/2012  . Recurrent colitis due to Clostridium difficile 06/02/2012  .  Hypertension   . Asthma   . Hyperlipidemia   . Fatigue   . Weight loss   . Hemorrhoids   . Malnutrition   . Dyspnea    Past Surgical History  Procedure Laterality Date  . Nstemi  06/2010  . Spinal fusion surgery    . Knee surgery    . Cardiac catheterization      SHOWED RUPTURE PLAQUE IN THE LAD. THE LAD IS NONOBSTRUCTIVE WITH ONLY 30-40% STENOSIS  . Back surgery    . Breast surgery  1964    x3  . Ankle surgery    . Colonoscopy     Family History  Problem Relation Age of Onset  . Kidney disease Mother   . Kidney disease Brother   . Lung cancer Father    History  Substance Use Topics  . Smoking status: Never Smoker   . Smokeless tobacco: Never Used  . Alcohol Use: No   OB History    No data available     Review of Systems  Constitutional: Positive for fever.  Respiratory: Positive for cough and shortness of breath.   Cardiovascular: Negative for chest pain and leg swelling.  All other systems reviewed and are negative.     Allergies  Biphosphate; Codeine; Morphine and related; Percocet; Plavix; and Sulfur  Home Medications   Prior to Admission medications   Medication Sig Start Date End Date  Taking? Authorizing Provider  albuterol (PROVENTIL HFA;VENTOLIN HFA) 108 (90 BASE) MCG/ACT inhaler Inhale 1 puff into the lungs every 6 (six) hours as needed for wheezing or shortness of breath.    Historical Provider, MD  aspirin 81 MG chewable tablet Chew 81 mg by mouth daily.    Historical Provider, MD  calcium-vitamin D (OSCAL WITH D) 500-200 MG-UNIT per tablet Take 1 tablet by mouth 3 (three) times daily.    Historical Provider, MD  cetirizine (ZYRTEC) 10 MG tablet Take 10 mg by mouth daily.    Historical Provider, MD  Cholecalciferol (VITAMIN D) 2000 UNITS CAPS Take 1 capsule (2,000 Units total) by mouth daily. 11/21/13   [name], NP  Cranberry 200 MG CAPS Take 2 capsules by mouth 2 (two) times daily.    Historical Provider, MD  dextromethorphan-guaiFENesin  (MUCINEX DM) 30-600 MG per 12 hr tablet Take 1 tablet by mouth 2 (two) times daily as needed for cough.    Historical Provider, MD  esomeprazole (NEXIUM) 20 MG capsule Take 20 mg by mouth 2 (two) times daily before a meal.    Historical Provider, MD  ferrous sulfate 325 (65 FE) MG tablet Take 325 mg by mouth daily with breakfast.    Historical Provider, MD  HYDROcodone-acetaminophen (NORCO/VICODIN) 5-325 MG per tablet Take one tablet by mouth every 4 to 6 hours as needed for pain. Do not exceed 4gm of Tylenol in 24 hours 07/19/14   Gildardo Cranker, DO  ipratropium (ATROVENT) 0.03 % nasal spray Place 2 sprays into both nostrils every 12 (twelve) hours.    Historical Provider, MD  levothyroxine (SYNTHROID, LEVOTHROID) 50 MCG tablet Take 50 mcg by mouth daily.     Historical Provider, MD  Lidocaine, Anorectal, 5 % CREA Apply 1 application topically 4 (four) times daily as needed.    Historical Provider, MD  LORazepam (ATIVAN) 0.5 MG tablet Take one tablet by mouth every night at bedtime for anxiety 09/04/14   Tiffany L Reed, DO  Menthol, Topical Analgesic, 4 % GEL Apply 1 application topically 3 (three) times daily as needed (may leave at bedside).    Historical Provider, MD  methocarbamol (ROBAXIN) 500 MG tablet Take 500 mg by mouth at bedtime.     Historical Provider, MD  metoprolol tartrate (LOPRESSOR) 25 MG tablet Take 25 mg by mouth 2 (two) times daily.    Historical Provider, MD  montelukast (SINGULAIR) 10 MG tablet Take 10 mg by mouth at bedtime.    Historical Provider, MD  Multiple Vitamin (MULTIVITAMIN) tablet Take 1 tablet by mouth daily.      Historical Provider, MD  Omega-3 Fatty Acids (FISH OIL) 1200 MG CAPS Take 1,200 mg by mouth daily.    Historical Provider, MD  Polyethyl Glycol-Propyl Glycol (SYSTANE) 0.4-0.3 % SOLN Apply 1 drop to eye 2 (two) times daily.    Historical Provider, MD  predniSONE (DELTASONE) 10 MG tablet Take 10 mg by mouth daily.    Historical Provider, MD  promethazine  (PHENERGAN) 25 MG tablet Take 25 mg by mouth every 6 (six) hours as needed for nausea or vomiting.    Historical Provider, MD  rosuvastatin (CRESTOR) 20 MG tablet Take 20 mg by mouth every evening.  07/05/13   [name], NP  saccharomyces boulardii (FLORASTOR) 250 MG capsule Take 250 mg by mouth 2 (two) times daily.     Historical Provider, MD  sertraline (ZOLOFT) 50 MG tablet Take 50 mg by mouth every morning.    Historical  Provider, MD   SpO2 86% Physical Exam  Constitutional: She is oriented to person, place, and time. She appears well-developed and well-nourished.  HENT:  Head: Normocephalic and atraumatic.  Right Ear: External ear normal.  Left Ear: External ear normal.  Nose: Nose normal.  Eyes: Right eye exhibits no discharge. Left eye exhibits no discharge.  Neck: Neck supple.  Cardiovascular: Regular rhythm and normal heart sounds.  Tachycardia present.   Pulmonary/Chest: Accessory muscle usage present. No stridor. Tachypnea noted. She has wheezes (inspiratory and expiratory wheezes).  Abdominal: Soft. There is no tenderness.  Musculoskeletal: She exhibits no edema.  Neurological: She is alert and oriented to person, place, and time.  Skin: Skin is warm and dry. She is not diaphoretic.  Nursing note and vitals reviewed.   ED Course  Procedures (including critical care time) Labs Review Labs Reviewed  COMPREHENSIVE METABOLIC PANEL - Abnormal; Notable for the following:    Glucose, Bld 133 (*)    Creatinine, Ser 1.19 (*)    Calcium 8.5 (*)    Total Protein 6.3 (*)    Albumin 3.3 (*)    Total Bilirubin 1.5 (*)    GFR calc non Af Amer 41 (*)    GFR calc Af Amer 48 (*)    All other components within normal limits  TROPONIN I - Abnormal; Notable for the following:    Troponin I 0.15 (*)    All other components within normal limits  CBC WITH DIFFERENTIAL/PLATELET - Abnormal; Notable for the following:    WBC 14.1 (*)    Hemoglobin 11.8 (*)    RDW 15.7 (*)     Platelets 110 (*)    Neutrophils Relative % 81 (*)    Lymphocytes Relative 11 (*)    Neutro Abs 11.4 (*)    All other components within normal limits  BLOOD GAS, ARTERIAL - Abnormal; Notable for the following:    pH, Arterial 7.346 (*)    pO2, Arterial 142 (*)    Acid-base deficit 2.7 (*)    All other components within normal limits  BRAIN NATRIURETIC PEPTIDE - Abnormal; Notable for the following:    B Natriuretic Peptide 1045.3 (*)    All other components within normal limits  LACTIC ACID, PLASMA - Abnormal; Notable for the following:    Lactic Acid, Venous 5.7 (*)    All other components within normal limits  I-STAT CG4 LACTIC ACID, ED - Abnormal; Notable for the following:    Lactic Acid, Venous 2.35 (*)    All other components within normal limits  CULTURE, BLOOD (ROUTINE X 2)  CULTURE, BLOOD (ROUTINE X 2)  GRAM STAIN  MRSA PCR SCREENING  PROTIME-INR  LACTIC ACID, PLASMA  PROCALCITONIN  HIV ANTIBODY (ROUTINE TESTING)  LEGIONELLA ANTIGEN, URINE  STREP PNEUMONIAE URINARY ANTIGEN  INFLUENZA PANEL BY PCR (TYPE A & B, H1N1)  COMPREHENSIVE METABOLIC PANEL  MAGNESIUM  PHOSPHORUS    Imaging Review Dg Chest Port 1 View  10/29/2014   CLINICAL DATA:  Respiratory distress.  Cough.  EXAM: PORTABLE CHEST - 1 VIEW  COMPARISON:  01/23/2013  FINDINGS: The heart is enlarged. Large hiatal hernia is again noted. There is prominence of interstitial markings consistent with interstitial edema.  IMPRESSION: 1. Cardiomegaly and mild edema. 2. Large hiatal hernia.   Electronically Signed   By: Nolon Nations M.D.   On: 10/29/2014 10:51     EKG Interpretation   Date/Time:  Sunday Oct 29 2014 10:20:20 EDT Ventricular Rate:  124 PR  Interval:  144 QRS Duration: 81 QT Interval:  332 QTC Calculation: 477 R Axis:   29 Text Interpretation:  Sinus tachycardia Ventricular premature complex  Consider inferior infarct HR increased, otherwise no significant change  since Jun 2014 Confirmed by  Pegi Milazzo  MD, [place] ([id number]) on 10/29/2014  10:22:35 AM      CRITICAL CARE Performed by: Sherwood Gambler T   Total critical care time: 45 minutes  Critical care time was exclusive of separately billable procedures and treating other patients.  Critical care was necessary to treat or prevent imminent or life-threatening deterioration.  Critical care was time spent personally by me on the following activities: development of treatment plan with patient and/or surrogate as well as nursing, discussions with consultants, evaluation of patient's response to treatment, examination of patient, obtaining history from patient or surrogate, ordering and performing treatments and interventions, ordering and review of laboratory studies, ordering and review of radiographic studies, pulse oximetry and re-evaluation of patient's condition.  MDM   Final diagnoses:  Hypoxia  HCAP (healthcare-associated pneumonia)  Acute pulmonary edema    Patient arrives in acute respiratory distress. Work of breathing improved with oxygen support as well as continuous albuterol. Has diffuse expiratory wheezing, chest x-ray consistent with some pulmonary edema. Clinical history is consistent with pneumonia, given she lives in assisted living this would be HCAP. Placed on BiPAP and she no longer has nausea and had significant improvement in work of breathing. When chest x-ray was evaluated her fluids were stopped given there could be a heart failure component to her shortness of breath. Patient's blood gas is unremarkable including no respiratory acidosis. She does not appear significantly fluid overloaded. Patient has improved but is still having increased work of breathing and oxygen requirement. Will need admission to the stepdown unit for close monitoring. I discussed options with family and patient, she is agreeable to intubation if needed and is currently a full code.    Sherwood Gambler, MD 10/29/14 [phone]

## 2014-10-29 NOTE — ED Notes (Signed)
Dr. Regenia Skeeter made aware of lactic acid

## 2014-10-29 NOTE — ED Notes (Signed)
Lisa RT contacted to place pt on bi-pap

## 2014-10-29 NOTE — Progress Notes (Signed)
Rt placed pt on BIPAP in ED per MD order. Pt tolerating well at this time.

## 2014-10-29 NOTE — Progress Notes (Signed)
ANTIBIOTIC CONSULT NOTE - INITIAL  Pharmacy Consult for Vancomycin & Cefepime Indication: pneumonia  Allergies  Allergen Reactions  . Biphosphate   . Codeine     sick  . Morphine And Related Other (See Comments)    unknown  . Percocet [Oxycodone-Acetaminophen]     unknown  . Plavix [Clopidogrel Bisulfate] Other (See Comments)    unknown  . Sulfur Other (See Comments)    unknown    Patient Measurements: Weight: 158 lb 1.1 oz (71.7 kg) Height:  66 inches  Vital Signs: Temp: 101.3 F (38.5 C) (05/01 1030) Temp Source: Rectal (05/01 1030) BP: 158/68 mmHg (05/01 1058) Pulse Rate: 132 (05/01 1058) Intake/Output from previous day:   Intake/Output from this shift:    Labs:  Recent Labs  10/29/14 1029  WBC 14.1*  HGB 11.8*  PLT PENDING  CREATININE 1.19*   Estimated Creatinine Clearance: 37 mL/min (by C-G formula based on Cr of 1.19). No results for input(s): VANCOTROUGH, VANCOPEAK, VANCORANDOM, GENTTROUGH, GENTPEAK, GENTRANDOM, TOBRATROUGH, TOBRAPEAK, TOBRARND, AMIKACINPEAK, AMIKACINTROU, AMIKACIN in the last 72 hours.   Microbiology: No results found for this or any previous visit (from the past 720 hour(s)).  Medical History: Past Medical History  Diagnosis Date  . SOB (shortness of breath)   . MI (myocardial infarction)   . Poor appetite   . Arthritis   . Fatigue   . Right ear pain   . Renal insufficiency   . RA (rheumatoid arthritis)   . PUD (peptic ulcer disease)   . GI bleed 2005  . GERD (gastroesophageal reflux disease)   . Hiatal hernia   . Hypothyroidism   . Osteoporosis   . Depression   . Anxiety   . Coronary artery disease   . CVA (cerebral vascular accident)   . TIA (transient ischemic attack)   . Fall   . DVT of lower extremity (deep venous thrombosis)   . HCAP (healthcare-associated pneumonia) 01/22/2013  . Enteritis due to Clostridium difficile 01/04/2013  . Sepsis 12/19/2012  . Recurrent colitis due to Clostridium difficile 06/02/2012   . Hypertension   . Asthma   . Hyperlipidemia   . Fatigue   . Weight loss   . Hemorrhoids   . Malnutrition   . Dyspnea     Medications:  Scheduled:  . albuterol      . aspirin  324 mg Oral Once   Infusions:  . albuterol 10 mg/hr (10/29/14 1044)  . vancomycin 1,000 mg (10/29/14 1054)   Assessment:  81 yr female presents with shortness of breath, cough and fever.  Tm = 101.66F in ED.  Received Vancomycin 1gm and Cefepime 2gm x 1 in the ED @ 10:54  Pharmacy consulted to dose Vancomycin and Cefepime for pneumonia  CrCl (CG) = 37 ml/min and CrCl (n) = 41 ml/min  Blood cultures ordered  Goal of Therapy:  Vancomycin trough level 15-20 mcg/ml  Plan:  Measure antibiotic drug levels at steady state Follow up culture results   Vancomycin 1gm IV q24h  Cefepime 1gm IV q24h  Ante Arredondo, Toribio Harbour, PharmD 10/29/2014,11:42 AM

## 2014-10-29 NOTE — H&P (Signed)
[facility] History and Physical  [name] EQA:[id number] DOB: 1930-03-21 DOA: 10/29/2014  Referring physician: ER physician, Dr. Sherwood Gambler  PCP: [name], MD  Chief Complaint: shortness of breath   HPI:  81 year old female with past medical history of remote CVA, hypertension, diastolic CHF, dyslipidemia who presented from [facility] with worsening shortness of breath, cough, fever and chills for past few days piror to this admission. Shortness of breath is present at rest and it is worse with movement. Pt reported dry and on occasion productive cough. She has associated chest discomfort with coughing. No palpitations. No reports of lightheadedness, falls. No abdominal pain, nausea or vomiting. No reports of blood in stool or urine. No reports of diarrhea or constipation. On EMS arrival she was given albuterol, Atrovent, solumedrol.   In ED, BP was 111/54, HR 110-149, RR 19-41, T max 101.26F and oxygen saturation 86% on room air. She required BiPAP to keep O2 sats above 90%. Blood work showed WBC count 14.1, platelets 110, creatinine 1.19, troponin 0.15, the 12 lead EKG sinus tachycardia. CXR showed cardiomegaly and mild edema. She was given nebulizers in ED, lasix 40 mg IV once, cefepime and vanco. She was admitted to SDU since she is on BiPAP.   Assessment & Plan     Principal Problem: Acute respiratory failure with hypoxia / Healthcare associated pneumonia - Pt presented with hypoxia on admission and oxygen saturation of 86% on room. Hypoxia likely from pneumonia.  - She required BiPAP in ED to keep O2 sats above 90% - CXR on admission showed cardiomegaly and mild edema. - Pt started on empiric cefepime and vanco for HCAP. Pneumonia order set placed. - Follow up blood culture results, strep pneumonia, legionella, resp culture results. Influenza panel is negative.  - Continue xopenex and atrovent nebs sch and as needed for shortness of breath or wheezing  - Because  pt requires BiPAP she will be placed in SDU.  Active Problems: Severe sepsis due to pneumonia / Leukocytosis  - Sepsis criteria met on admission with tachycardia, tachypnea, fever, hypoxia. In addition, WBC count was 14.1. Lactic acid was 8.7 and procalcitonin 0.21 - Sepsis order set placed - Follow up blood culture results - Continue empiric cefepime and rocephin  - IV fluids on hold due to concern for pulmonary edema which is seen on CXR  Mild troponin elevation  - Likely demand ischemia fro sepsis , hypoxia and pneumonia - Troponin 0.15 on admission; cycle cardiac enzymes  - No acute ischemic changes on 12 lead EKG - Obtain 2 D ECHO - Continue daily aspirin  - Would not use heparin at this time, will cycle cardiac enzymes and if trend up will consult cardio   Acute on chronic diastolic CHF - BNP on admission 1045. - She was given lasix 40 mg IV in ED - Please assess in am if renal function better and then may give lasix again potentially  - Obtain 2 D ECHO - Daily weight and strict intake and output  Essential hypertension - Continue metoprolol   Dyslipidemia - Continue Crestor   Iron deficiency anemia anemia of chronic kidney disease  - Continue ferrous sulfate supplementation  - Hemoglobin stable  CKD, stage 4 - Baseline creatinine compared from year ago 1.8 and on this admission up to 1.8 - Given lasix in ED for ?pulmonary edema - Follow up BMP tomorrow am   Hypothyroidism - Continue synthroid    DVT prophylaxis:  - SCD's bilaterally  Radiological Exams on Admission: Dg Chest Port 1 View 10/29/2014   1. Cardiomegaly and mild edema. 2. Large hiatal hernia.   Electronically Signed   By: Nolon Nations M.D.   On: 10/29/2014 10:51    EKG: I have personally reviewed EKG. EKG shows sinus tachycardia.   Code Status: Full Family Communication: Plan of care discussed with the patient and family at the bedside  Disposition Plan: Admit for further evaluation,  stepdown unit   Leisa Lenz, MD  [facility] Pager [phone]  Time spent in minutes: 75 minutes  Review of Systems:  Constitutional: Negative for fever, chills and malaise/fatigue. Negative for diaphoresis.  HENT: Negative for hearing loss, ear pain, nosebleeds, congestion, sore throat, neck pain, tinnitus and ear discharge.   Eyes: Negative for blurred vision, double vision, photophobia, pain, discharge and redness.  Respiratory: per HPI.   Cardiovascular: Negative for chest pain, palpitations, orthopnea, claudication and leg swelling.  Gastrointestinal: Negative for nausea, vomiting and abdominal pain. Negative for heartburn, constipation, blood in stool and melena.  Genitourinary: Negative for dysuria, urgency, frequency, hematuria and flank pain.  Musculoskeletal: Negative for myalgias, back pain, joint pain and falls.  Skin: Negative for itching and rash.  Neurological: Negative for dizziness and weakness. Negative for tingling, tremors, sensory change, speech change, focal weakness, loss of consciousness and headaches.  Endo/Heme/Allergies: Negative for environmental allergies and polydipsia. Does not bruise/bleed easily.  Psychiatric/Behavioral: Negative for suicidal ideas. The patient is not nervous/anxious.      Past Medical History  Diagnosis Date  . SOB (shortness of breath)   . MI (myocardial infarction)   . Poor appetite   . Arthritis   . Fatigue   . Right ear pain   . Renal insufficiency   . RA (rheumatoid arthritis)   . PUD (peptic ulcer disease)   . GI bleed 2005  . GERD (gastroesophageal reflux disease)   . Hiatal hernia   . Hypothyroidism   . Osteoporosis   . Depression   . Anxiety   . Coronary artery disease   . CVA (cerebral vascular accident)   . TIA (transient ischemic attack)   . Fall   . DVT of lower extremity (deep venous thrombosis)   . HCAP (healthcare-associated pneumonia) 01/22/2013  . Enteritis due to Clostridium difficile 01/04/2013  .  Sepsis 12/19/2012  . Recurrent colitis due to Clostridium difficile 06/02/2012  . Hypertension   . Asthma   . Hyperlipidemia   . Fatigue   . Weight loss   . Hemorrhoids   . Malnutrition   . Dyspnea    Past Surgical History  Procedure Laterality Date  . Nstemi  06/2010  . Spinal fusion surgery    . Knee surgery    . Cardiac catheterization      SHOWED RUPTURE PLAQUE IN THE LAD. THE LAD IS NONOBSTRUCTIVE WITH ONLY 30-40% STENOSIS  . Back surgery    . Breast surgery  1964    x3  . Ankle surgery    . Colonoscopy     Social History:  reports that she has never smoked. She has never used smokeless tobacco. She reports that she does not drink alcohol or use illicit drugs.  Allergies  Allergen Reactions  . Biphosphate   . Codeine     sick  . Morphine And Related Other (See Comments)    unknown  . Percocet [Oxycodone-Acetaminophen]     unknown  . Plavix [Clopidogrel Bisulfate] Other (See Comments)    unknown  . Sulfur  Other (See Comments)    unknown    Family History:  Family History  Problem Relation Age of Onset  . Kidney disease Mother   . Kidney disease Brother   . Lung cancer Father      Prior to Admission medications   Medication Sig Start Date End Date Taking? Authorizing Provider  albuterol (PROVENTIL HFA;VENTOLIN HFA) 108 (90 BASE) MCG/ACT inhaler Inhale 1 puff into the lungs every 6 (six) hours as needed for wheezing or shortness of breath.    Historical Provider, MD  aspirin 81 MG chewable tablet Chew 81 mg by mouth daily.    Historical Provider, MD  calcium-vitamin D (OSCAL WITH D) 500-200 MG-UNIT per tablet Take 1 tablet by mouth 3 (three) times daily.    Historical Provider, MD  cetirizine (ZYRTEC) 10 MG tablet Take 10 mg by mouth daily.    Historical Provider, MD  Cholecalciferol (VITAMIN D) 2000 UNITS CAPS Take 1 capsule (2,000 Units total) by mouth daily. 11/21/13   [name], NP  Cranberry 200 MG CAPS Take 2 capsules by mouth 2 (two) times daily.     Historical Provider, MD  dextromethorphan-guaiFENesin (MUCINEX DM) 30-600 MG per 12 hr tablet Take 1 tablet by mouth 2 (two) times daily as needed for cough.    Historical Provider, MD  esomeprazole (NEXIUM) 20 MG capsule Take 20 mg by mouth 2 (two) times daily before a meal.    Historical Provider, MD  ferrous sulfate 325 (65 FE) MG tablet Take 325 mg by mouth daily with breakfast.    Historical Provider, MD  HYDROcodone-acetaminophen (NORCO/VICODIN) 5-325 MG per tablet Take one tablet by mouth every 4 to 6 hours as needed for pain. Do not exceed 4gm of Tylenol in 24 hours 07/19/14   Gildardo Cranker, DO  ipratropium (ATROVENT) 0.03 % nasal spray Place 2 sprays into both nostrils every 12 (twelve) hours.    Historical Provider, MD  ipratropium-albuterol (DUONEB) 0.5-2.5 (3) MG/3ML SOLN  10/27/14   Historical Provider, MD  levothyroxine (SYNTHROID, LEVOTHROID) 50 MCG tablet Take 50 mcg by mouth daily.     Historical Provider, MD  Lidocaine, Anorectal, 5 % CREA Apply 1 application topically 4 (four) times daily as needed.    Historical Provider, MD  LORazepam (ATIVAN) 0.5 MG tablet Take one tablet by mouth every night at bedtime for anxiety 09/04/14   Tiffany L Reed, DO  Menthol, Topical Analgesic, 4 % GEL Apply 1 application topically 3 (three) times daily as needed (may leave at bedside).    Historical Provider, MD  methocarbamol (ROBAXIN) 500 MG tablet Take 500 mg by mouth at bedtime.     Historical Provider, MD  metoprolol tartrate (LOPRESSOR) 25 MG tablet Take 25 mg by mouth 2 (two) times daily.    Historical Provider, MD  montelukast (SINGULAIR) 10 MG tablet Take 10 mg by mouth at bedtime.    Historical Provider, MD  Multiple Vitamin (MULTIVITAMIN) tablet Take 1 tablet by mouth daily.      Historical Provider, MD  Omega-3 Fatty Acids (FISH OIL) 1200 MG CAPS Take 1,200 mg by mouth daily.    Historical Provider, MD  Polyethyl Glycol-Propyl Glycol (SYSTANE) 0.4-0.3 % SOLN Apply 1 drop to eye 2 (two) times  daily.    Historical Provider, MD  predniSONE (DELTASONE) 10 MG tablet Take 10 mg by mouth daily.    Historical Provider, MD  promethazine (PHENERGAN) 25 MG tablet Take 25 mg by mouth every 6 (six) hours as needed for nausea  or vomiting.    Historical Provider, MD  rosuvastatin (CRESTOR) 20 MG tablet Take 20 mg by mouth every evening.  07/05/13   [name], NP  saccharomyces boulardii (FLORASTOR) 250 MG capsule Take 250 mg by mouth 2 (two) times daily.     Historical Provider, MD  sertraline (ZOLOFT) 50 MG tablet Take 50 mg by mouth every morning.    Historical Provider, MD   Physical Exam: Filed Vitals:   10/29/14 1044 10/29/14 1058 10/29/14 1137 10/29/14 1139  BP:  158/68    Pulse:  132 149   Temp:      TempSrc:      Resp:  32 28   Weight:    71.7 kg (158 lb 1.1 oz)  SpO2: 100% 96% 98%     Physical Exam  Constitutional: Appears ill, mild resp distress while on BiPAP.  HENT: Normocephalic. No tonsillar erythema or exudates Eyes: Conjunctivae and EOM are normal. No scleral icterus.  Neck: Normal ROM. Neck supple. No JVD. No tracheal deviation. No thyromegaly.  CVS: tachcyardia, S1/S2 appreciated  Pulmonary: wheezing in upper and mid lung lobes inspiratory and expiratory, (+) diffuse rales.  Abdominal: Soft. BS +,  no distension, tenderness, rebound or guarding.  Musculoskeletal: Normal range of motion. No edema and no tenderness.  Lymphadenopathy: No lymphadenopathy noted, cervical, inguinal. Neuro: Alert. Normal reflexes, muscle tone coordination. No focal neurologic deficits. Skin: Skin is warm and dry. No rash noted.  No erythema. No pallor.  Psychiatric: Normal mood and affect. Behavior, judgment, thought content normal.   Labs on Admission:  Basic Metabolic Panel:  Recent Labs Lab 10/29/14 1029  NA 138  K 4.4  CL 109  CO2 23  GLUCOSE 133*  BUN 17  CREATININE 1.19*  CALCIUM 8.5*   Liver Function Tests:  Recent Labs Lab 10/29/14 1029  AST 39  ALT 19   ALKPHOS 47  BILITOT 1.5*  PROT 6.3*  ALBUMIN 3.3*   No results for input(s): LIPASE, AMYLASE in the last 168 hours. No results for input(s): AMMONIA in the last 168 hours. CBC:  Recent Labs Lab 10/29/14 1029  WBC 14.1*  NEUTROABS 11.4*  HGB 11.8*  HCT 38.2  MCV 90.5  PLT 110*   Cardiac Enzymes:  Recent Labs Lab 10/29/14 1029  TROPONINI 0.15*   BNP: Invalid input(s): POCBNP CBG: No results for input(s): GLUCAP in the last 168 hours.  If 7PM-7AM, please contact night-coverage [email or web address] Password [email or web address] 10/29/2014, 12:59 PM

## 2014-10-30 ENCOUNTER — Inpatient Hospital Stay (HOSPITAL_COMMUNITY): Payer: Medicare Other

## 2014-10-30 ENCOUNTER — Ambulatory Visit (HOSPITAL_COMMUNITY): Payer: Medicare Other

## 2014-10-30 ENCOUNTER — Encounter (HOSPITAL_COMMUNITY): Payer: Self-pay | Admitting: Cardiology

## 2014-10-30 DIAGNOSIS — R0902 Hypoxemia: Secondary | ICD-10-CM

## 2014-10-30 DIAGNOSIS — R7989 Other specified abnormal findings of blood chemistry: Secondary | ICD-10-CM

## 2014-10-30 DIAGNOSIS — R778 Other specified abnormalities of plasma proteins: Secondary | ICD-10-CM | POA: Diagnosis present

## 2014-10-30 DIAGNOSIS — R079 Chest pain, unspecified: Secondary | ICD-10-CM

## 2014-10-30 LAB — CBC
HEMATOCRIT: 36.5 % (ref 36.0–46.0)
HEMOGLOBIN: 11.4 g/dL — AB (ref 12.0–15.0)
MCH: 27.6 pg (ref 26.0–34.0)
MCHC: 31.2 g/dL (ref 30.0–36.0)
MCV: 88.4 fL (ref 78.0–100.0)
Platelets: 117 10*3/uL — ABNORMAL LOW (ref 150–400)
RBC: 4.13 MIL/uL (ref 3.87–5.11)
RDW: 15.5 % (ref 11.5–15.5)
WBC: 21.6 10*3/uL — ABNORMAL HIGH (ref 4.0–10.5)

## 2014-10-30 LAB — LEGIONELLA ANTIGEN, URINE

## 2014-10-30 LAB — COMPREHENSIVE METABOLIC PANEL
ALT: 23 U/L (ref 14–54)
ALT: 25 U/L (ref 14–54)
ANION GAP: 20 — AB (ref 5–15)
AST: 44 U/L — ABNORMAL HIGH (ref 15–41)
AST: 41 U/L (ref 15–41)
Albumin: 3.4 g/dL — ABNORMAL LOW (ref 3.5–5.0)
Albumin: 3.3 g/dL — ABNORMAL LOW (ref 3.5–5.0)
Alkaline Phosphatase: 47 U/L (ref 38–126)
Alkaline Phosphatase: 48 U/L (ref 38–126)
Anion gap: 11 (ref 5–15)
BUN: 21 mg/dL — AB (ref 6–20)
BUN: 33 mg/dL — AB (ref 6–20)
CO2: 13 mmol/L — ABNORMAL LOW (ref 22–32)
CO2: 26 mmol/L (ref 22–32)
CREATININE: 1.8 mg/dL — AB (ref 0.44–1.00)
CREATININE: 1.51 mg/dL — AB (ref 0.44–1.00)
Calcium: 9.1 mg/dL (ref 8.9–10.3)
Calcium: 10.2 mg/dL (ref 8.9–10.3)
Chloride: 104 mmol/L (ref 101–111)
Chloride: 101 mmol/L (ref 101–111)
GFR calc Af Amer: 36 mL/min — ABNORMAL LOW (ref >60)
GFR calc non Af Amer: 31 mL/min — ABNORMAL LOW (ref >60)
GFR, EST AFRICAN AMERICAN: 29 mL/min — AB (ref >60)
GFR, EST NON AFRICAN AMERICAN: 25 mL/min — AB (ref >60)
GLUCOSE: 352 mg/dL — AB (ref 70–99)
Glucose, Bld: 175 mg/dL — ABNORMAL HIGH (ref 70–99)
POTASSIUM: 4 mmol/L (ref 3.5–5.1)
Potassium: 4.1 mmol/L (ref 3.5–5.1)
Sodium: 137 mmol/L (ref 135–145)
Sodium: 138 mmol/L (ref 135–145)
TOTAL PROTEIN: 6.3 g/dL — AB (ref 6.5–8.1)
Total Bilirubin: 0.8 mg/dL (ref 0.3–1.2)
Total Bilirubin: 0.9 mg/dL (ref 0.3–1.2)
Total Protein: 6.7 g/dL (ref 6.5–8.1)

## 2014-10-30 LAB — HEMOGLOBIN AND HEMATOCRIT, BLOOD
HCT: 35.3 % — ABNORMAL LOW (ref 36.0–46.0)
Hemoglobin: 10.9 g/dL — ABNORMAL LOW (ref 12.0–15.0)

## 2014-10-30 LAB — OCCULT BLOOD GASTRIC / DUODENUM (SPECIMEN CUP)
OCCULT BLOOD, GASTRIC: NEGATIVE
PH, GASTRIC: 4

## 2014-10-30 LAB — GLUCOSE, CAPILLARY: Glucose-Capillary: 162 mg/dL — ABNORMAL HIGH (ref 70–99)

## 2014-10-30 LAB — TYPE AND SCREEN
ABO/RH(D): O NEG
Antibody Screen: NEGATIVE

## 2014-10-30 LAB — TROPONIN I: Troponin I: 0.71 ng/mL (ref <0.031)

## 2014-10-30 LAB — HIV ANTIBODY (ROUTINE TESTING W REFLEX): HIV Screen 4th Generation wRfx: NONREACTIVE

## 2014-10-30 MED ORDER — ASPIRIN EC 325 MG PO TBEC: 325.0000 mg | DELAYED_RELEASE_TABLET | Freq: Every day | ORAL | Status: DC

## 2014-10-30 MED ORDER — HYDRALAZINE HCL 20 MG/ML IJ SOLN
5.0000 mg | INTRAMUSCULAR | Status: DC | PRN
  Administered 2014-10-30 (×2): 5 mg via INTRAVENOUS
  Filled 2014-10-30 (×2): qty 1

## 2014-10-30 MED ORDER — PANTOPRAZOLE SODIUM 40 MG IV SOLR: 40.0000 mg | Freq: Two times a day (BID) | INTRAVENOUS | Status: DC

## 2014-10-30 MED ORDER — HYDRALAZINE HCL 20 MG/ML IJ SOLN: 5.0000 mg | INTRAMUSCULAR | Status: DC | PRN

## 2014-10-30 MED ORDER — PANTOPRAZOLE SODIUM 40 MG IV SOLR
80.0000 mg | Freq: Once | INTRAVENOUS | Status: AC
  Administered 2014-10-30: 80 mg via INTRAVENOUS
  Filled 2014-10-30: qty 80

## 2014-10-30 MED ORDER — IPRATROPIUM BROMIDE 0.02 % IN SOLN
0.5000 mg | Freq: Four times a day (QID) | RESPIRATORY_TRACT | Status: DC
  Administered 2014-10-30 – 2014-11-02 (×12): 0.5 mg via RESPIRATORY_TRACT
  Filled 2014-10-30 (×12): qty 2.5

## 2014-10-30 MED ORDER — SODIUM CHLORIDE 0.9 % IV SOLN
8.0000 mg/h | INTRAVENOUS | Status: DC
  Administered 2014-10-30 – 2014-11-02 (×7): 8 mg/h via INTRAVENOUS
  Filled 2014-10-30 (×14): qty 80

## 2014-10-30 MED ORDER — ASPIRIN 81 MG PO CHEW
81.0000 mg | CHEWABLE_TABLET | Freq: Once | ORAL | Status: AC
  Administered 2014-10-30: 81 mg via ORAL
  Filled 2014-10-30: qty 1

## 2014-10-30 MED ORDER — METOPROLOL TARTRATE 25 MG PO TABS: 25.0000 mg | ORAL_TABLET | Freq: Two times a day (BID) | ORAL | Status: DC

## 2014-10-30 MED ORDER — LEVALBUTEROL HCL 1.25 MG/0.5ML IN NEBU
1.2500 mg | INHALATION_SOLUTION | Freq: Four times a day (QID) | RESPIRATORY_TRACT | Status: DC
  Administered 2014-10-30 – 2014-11-02 (×12): 1.25 mg via RESPIRATORY_TRACT
  Filled 2014-10-30 (×12): qty 0.5

## 2014-10-30 MED ORDER — BOOST / RESOURCE BREEZE PO LIQD
1.0000 | ORAL | Status: DC
  Administered 2014-10-30 – 2014-10-31 (×2): via ORAL

## 2014-10-30 MED ORDER — ACETAMINOPHEN 325 MG PO TABS
650.0000 mg | ORAL_TABLET | Freq: Four times a day (QID) | ORAL | Status: DC | PRN
  Administered 2014-10-30 – 2014-11-04 (×6): 650 mg via ORAL
  Filled 2014-10-30 (×6): qty 2

## 2014-10-30 NOTE — Care Management Note (Signed)
Case Management Note  Patient Details  Name: [name] MRN: [id number] Date of Birth: 08/05/1929  Subjective/Objective:       bipap             Action/Plan: Home when stable  Expected Discharge Date:  11/05/14               Expected Discharge Plan:  Home/Self Care  In-House Referral:  NA  Discharge planning Services  CM Consult  Post Acute Care Choice:    Choice offered to:     DME Arranged:    DME Agency:     HH Arranged:    [place] Agency:     Status of Service:     Medicare Important Message Given:    Date Medicare IM Given:    Medicare IM give by:    Date Additional Medicare IM Given:    Additional Medicare Important Message give by:     If discussed at [place] of Stay Meetings, dates discussed:    Additional Comments:  Leeroy Cha, RN 10/30/2014, 1:00 PM

## 2014-10-30 NOTE — Consult Note (Signed)
Reason for Consult: elevated troponin   Referring Physician: Dr. Charlies Silvers    PCP:  [name], MD  Primary Cardiologist:Had been pt of Dr. Doreatha Lew, now Dr. Rosalie Gums [name] is an 81 y.o. female.    Chief Complaint: SOB, productive cough   HPI: 81 year old female with past medical history of remote CVA, hypertension, NSTEMI 6433 diastolic CHF, dyslipidemia, RA  who presented from [facility] with worsening shortness of breath, cough, fever and chills for past few days piror to this admission. Shortness of breath is present at rest and it is worse with movement. Pt reported dry and on occasion productive cough. She has associated chest discomfort with coughing. No palpitations. No reports of lightheadedness, falls. No abdominal pain, nausea or vomiting. No reports of blood in stool or urine. No reports of diarrhea or constipation. On EMS arrival she was given albuterol, Atrovent, solumedrol.   Her CAD with NSTEMI included cath 2012 and LAD with 95% tubular lesion that was small and mLAD with ruptured plaque with 30-40% stenosis  Along with diagonal and RCA non obstructed stenosis.  She was treated medically.  Last nuc 05/2011 with no evidence of ischemia fixed defect involving the inf. Wall and likely representing diaphragmatic attenuation artifact. EF 79%.   In ER temp was 101.3 and oxygen 86% RA.  BiPAP was used.  WBC 14.1.  Troponin was elevated at 0.15 and pk now is 0.71.  EKG with old Q wave in III,no acute changes from 11/2012. Echoes have been ordered but I do not find results. Last one 03/2012.  Currently still with cough off of BiPAP.  No chest pain and has not had any, only cold that began a week ago.  Prior to that no DOE.    Echo is in process.  Past Medical History  Diagnosis Date  . SOB (shortness of breath)   . MI (myocardial infarction)   . Poor appetite   . Arthritis   . Fatigue   . Right ear pain   . Renal insufficiency   . RA (rheumatoid  arthritis)   . PUD (peptic ulcer disease)   . GI bleed 2005  . GERD (gastroesophageal reflux disease)   . Hiatal hernia   . Hypothyroidism   . Osteoporosis   . Depression   . Anxiety   . Coronary artery disease   . CVA (cerebral vascular accident)   . TIA (transient ischemic attack)   . Fall   . DVT of lower extremity (deep venous thrombosis)   . HCAP (healthcare-associated pneumonia) 01/22/2013  . Enteritis due to Clostridium difficile 01/04/2013  . Sepsis 12/19/2012  . Recurrent colitis due to Clostridium difficile 06/02/2012  . Hypertension   . Hyperlipidemia   . Fatigue   . Weight loss   . Hemorrhoids   . Malnutrition   . Dyspnea     Past Surgical History  Procedure Laterality Date  . Nstemi  06/2010  . Spinal fusion surgery    . Knee surgery    . Cardiac catheterization      SHOWED RUPTURE PLAQUE IN THE LAD. THE LAD IS NONOBSTRUCTIVE WITH ONLY 30-40% STENOSIS  . Back surgery    . Breast surgery  1964    x3  . Ankle surgery    . Colonoscopy      Family History  Problem Relation Age of Onset  . Kidney disease Mother   . Kidney disease Brother   . Lung  cancer Father    Social History:  reports that she has never smoked. She has never used smokeless tobacco. She reports that she does not drink alcohol or use illicit drugs.  Allergies:  Allergies  Allergen Reactions  . Biphosphate   . Codeine     sick  . Morphine And Related Other (See Comments)    unknown  . Percocet [Oxycodone-Acetaminophen]     unknown  . Plavix [Clopidogrel Bisulfate] Other (See Comments)    unknown  . Sulfur Other (See Comments)    unknown   Scheduled Meds: . calcium-vitamin D  1 tablet Oral TID  . ceFEPime (MAXIPIME) IV  1 g Intravenous Q24H  . cholecalciferol  2,000 Units Oral Daily  . ferrous sulfate  325 mg Oral Q breakfast  . ipratropium  2 spray Each Nare Q12H  . ipratropium  0.5 mg Nebulization Q6H  . levalbuterol  1.25 mg Nebulization 4 times per day  . levothyroxine  50  mcg Oral QAC breakfast  . loratadine  10 mg Oral Daily  . LORazepam  0.5 mg Oral QHS  . methocarbamol  500 mg Oral QHS  . metoprolol tartrate  25 mg Oral BID  . montelukast  10 mg Oral QHS  . multivitamin with minerals  1 tablet Oral Daily  . [START ON 11/02/2014] pantoprazole (PROTONIX) IV  40 mg Intravenous Q12H  . rosuvastatin  20 mg Oral QPM  . saccharomyces boulardii  250 mg Oral BID  . sertraline  50 mg Oral q morning - 10a  . vancomycin  1,000 mg Intravenous Q24H   Continuous Infusions: . pantoprozole (PROTONIX) infusion 8 mg/hr (10/30/14 1035)   PRN Meds:.acetaminophen, camphor-menthol, dextromethorphan-guaiFENesin, hydrALAZINE, ipratropium, levalbuterol, polyvinyl alcohol, promethazine    Results for orders placed or performed during the hospital encounter of 10/29/14 (from the past 48 hour(s))  Blood gas, arterial     Status: Abnormal   Collection Time: 10/29/14 10:25 AM  Result Value Ref Range   O2 Content 8.0 L/min   Delivery systems OXYGEN MASK    pH, Arterial 7.346 (L) 7.350 - 7.450   pCO2 arterial 41.8 35.0 - 45.0 mmHg   pO2, Arterial 142 (H) 80.0 - 100.0 mmHg   Bicarbonate 22.3 20.0 - 24.0 mEq/L   TCO2 20.4 0 - 100 mmol/L   Acid-base deficit 2.7 (H) 0.0 - 2.0 mmol/L   O2 Saturation 98.1 %   Patient temperature 98.6    Collection site BRACHIAL ARTERY    Drawn by [id number]    Sample type ARTERIAL DRAW    Allens test (pass/fail) PASS PASS  Brain natriuretic peptide     Status: Abnormal   Collection Time: 10/29/14 10:27 AM  Result Value Ref Range   B Natriuretic Peptide 1045.3 (H) 0.0 - 100.0 pg/mL  Comprehensive metabolic panel     Status: Abnormal   Collection Time: 10/29/14 10:29 AM  Result Value Ref Range   Sodium 138 135 - 145 mmol/L   Potassium 4.4 3.5 - 5.1 mmol/L   Chloride 109 101 - 111 mmol/L   CO2 23 22 - 32 mmol/L   Glucose, Bld 133 (H) 70 - 99 mg/dL   BUN 17 6 - 20 mg/dL   Creatinine, Ser 1.19 (H) 0.44 - 1.00 mg/dL   Calcium 8.5 (L) 8.9 - 10.3  mg/dL   Total Protein 6.3 (L) 6.5 - 8.1 g/dL   Albumin 3.3 (L) 3.5 - 5.0 g/dL   AST 39 15 - 41 U/L   ALT 19  14 - 54 U/L   Alkaline Phosphatase 47 38 - 126 U/L   Total Bilirubin 1.5 (H) 0.3 - 1.2 mg/dL   GFR calc non Af Amer 41 (L) >60 mL/min   GFR calc Af Amer 48 (L) >60 mL/min    Comment: (NOTE) The eGFR has been calculated using the CKD EPI equation. This calculation has not been validated in all clinical situations. eGFR's persistently <90 mL/min signify possible Chronic Kidney Disease.    Anion gap 6 5 - 15  Troponin I     Status: Abnormal   Collection Time: 10/29/14 10:29 AM  Result Value Ref Range   Troponin I 0.15 (H) <0.031 ng/mL    Comment:        PERSISTENTLY INCREASED TROPONIN VALUES IN THE RANGE OF 0.04-0.49 ng/mL CAN BE SEEN IN:       -UNSTABLE ANGINA       -CONGESTIVE HEART FAILURE       -MYOCARDITIS       -CHEST TRAUMA       -ARRYHTHMIAS       -LATE PRESENTING MYOCARDIAL INFARCTION       -COPD   CLINICAL FOLLOW-UP RECOMMENDED.   CBC with Differential     Status: Abnormal   Collection Time: 10/29/14 10:29 AM  Result Value Ref Range   WBC 14.1 (H) 4.0 - 10.5 K/uL   RBC 4.22 3.87 - 5.11 MIL/uL   Hemoglobin 11.8 (L) 12.0 - 15.0 g/dL   HCT 38.2 36.0 - 46.0 %   MCV 90.5 78.0 - 100.0 fL   MCH 28.0 26.0 - 34.0 pg   MCHC 30.9 30.0 - 36.0 g/dL   RDW 15.7 (H) 11.5 - 15.5 %   Platelets 110 (L) 150 - 400 K/uL    Comment: SPECIMEN CHECKED FOR CLOTS REPEATED TO VERIFY PLATELET COUNT CONFIRMED BY SMEAR    Neutrophils Relative % 81 (H) 43 - 77 %   Lymphocytes Relative 11 (L) 12 - 46 %   Monocytes Relative 7 3 - 12 %   Eosinophils Relative 1 0 - 5 %   Basophils Relative 0 0 - 1 %   Neutro Abs 11.4 (H) 1.7 - 7.7 K/uL   Lymphs Abs 1.6 0.7 - 4.0 K/uL   Monocytes Absolute 1.0 0.1 - 1.0 K/uL   Eosinophils Absolute 0.1 0.0 - 0.7 K/uL   Basophils Absolute 0.0 0.0 - 0.1 K/uL   Smear Review LARGE PLATELETS PRESENT   Protime-INR     Status: None   Collection Time:  10/29/14 10:29 AM  Result Value Ref Range   Prothrombin Time 13.6 11.6 - 15.2 seconds   INR 1.02 0.00 - 1.49  I-Stat CG4 Lactic Acid, ED     Status: Abnormal   Collection Time: 10/29/14 10:36 AM  Result Value Ref Range   Lactic Acid, Venous 2.35 (HH) 0.5 - 2.0 mmol/L   Comment NOTIFIED PHYSICIAN   Culture, blood (x 2)     Status: None (Preliminary result)   Collection Time: 10/29/14  1:23 PM  Result Value Ref Range   Specimen Description BLOOD RIGHT ANTECUBITAL    Special Requests BOTTLES DRAWN AEROBIC AND ANAEROBIC 5CC EACH    Culture             BLOOD CULTURE RECEIVED NO GROWTH TO DATE CULTURE WILL BE HELD FOR 5 DAYS BEFORE ISSUING A FINAL NEGATIVE REPORT Performed at [facility]    Report Status PENDING   Culture, blood (x 2)  Status: None (Preliminary result)   Collection Time: 10/29/14  1:40 PM  Result Value Ref Range   Specimen Description BLOOD RIGHT WRIST    Special Requests BOTTLES DRAWN AEROBIC AND ANAEROBIC 5CC EACH    Culture             BLOOD CULTURE RECEIVED NO GROWTH TO DATE CULTURE WILL BE HELD FOR 5 DAYS BEFORE ISSUING A FINAL NEGATIVE REPORT Performed at [facility]    Report Status PENDING   Procalcitonin     Status: None   Collection Time: 10/29/14  1:47 PM  Result Value Ref Range   Procalcitonin 0.21 ng/mL    Comment:        Interpretation: PCT (Procalcitonin) <= 0.5 ng/mL: Systemic infection (sepsis) is not likely. Local bacterial infection is possible. (NOTE)         ICU PCT Algorithm               Non ICU PCT Algorithm    ----------------------------     ------------------------------         PCT < 0.25 ng/mL                 PCT < 0.1 ng/mL     Stopping of antibiotics            Stopping of antibiotics       strongly encouraged.               strongly encouraged.    ----------------------------     ------------------------------       PCT level decrease by               PCT < 0.25 ng/mL       >= 80% from peak PCT       OR PCT  0.25 - 0.5 ng/mL          Stopping of antibiotics                                             encouraged.     Stopping of antibiotics           encouraged.    ----------------------------     ------------------------------       PCT level decrease by              PCT >= 0.25 ng/mL       < 80% from peak PCT        AND PCT >= 0.5 ng/mL            Continuin g antibiotics                                              encouraged.       Continuing antibiotics            encouraged.    ----------------------------     ------------------------------     PCT level increase compared          PCT > 0.5 ng/mL         with peak PCT AND          PCT >= 0.5 ng/mL             Escalation of antibiotics  strongly encouraged.      Escalation of antibiotics        strongly encouraged.   Lactic acid, plasma     Status: Abnormal   Collection Time: 10/29/14  1:48 PM  Result Value Ref Range   Lactic Acid, Venous 5.7 (HH) 0.5 - 2.0 mmol/L    Comment: REPEATED TO VERIFY CRITICAL RESULT CALLED TO, READ BACK BY AND VERIFIED WITH: M QUICK AT 1439 ON 05.01.2016 BY N BROOKS   MRSA PCR Screening     Status: None   Collection Time: 10/29/14  3:07 PM  Result Value Ref Range   MRSA by PCR NEGATIVE NEGATIVE    Comment:        The GeneXpert MRSA Assay (FDA approved for NASAL specimens only), is one component of a comprehensive MRSA colonization surveillance program. It is not intended to diagnose MRSA infection nor to guide or monitor treatment for MRSA infections.   Strep pneumoniae urinary antigen     Status: None   Collection Time: 10/29/14  3:25 PM  Result Value Ref Range   Strep Pneumo Urinary Antigen NEGATIVE NEGATIVE    Comment:        Infection due to S. pneumoniae cannot be absolutely ruled out since the antigen present may be below the detection limit of the test. Performed at [facility]   Influenza panel by pcr     Status: None    Collection Time: 10/29/14  3:25 PM  Result Value Ref Range   Influenza A By PCR NEGATIVE NEGATIVE   Influenza B By PCR NEGATIVE NEGATIVE   H1N1 flu by pcr NOT DETECTED NOT DETECTED    Comment:        The Xpert Flu assay (FDA approved for nasal aspirates or washes and nasopharyngeal swab specimens), is intended as an aid in the diagnosis of influenza and should not be used as a sole basis for treatment. Performed at [facility]   Lactic acid, plasma     Status: Abnormal   Collection Time: 10/29/14  4:59 PM  Result Value Ref Range   Lactic Acid, Venous 8.7 (HH) 0.5 - 2.0 mmol/L    Comment: REPEATED TO VERIFY CRITICAL RESULT CALLED TO, READ BACK BY AND VERIFIED WITH: A KOONTZ AT 1802 ON 05.01.2016 BY NBROOKS   Comprehensive metabolic panel     Status: Abnormal   Collection Time: 10/29/14  4:59 PM  Result Value Ref Range   Sodium 137 135 - 145 mmol/L   Potassium 4.1 3.5 - 5.1 mmol/L   Chloride 104 101 - 111 mmol/L   CO2 13 (L) 22 - 32 mmol/L   Glucose, Bld 352 (H) 70 - 99 mg/dL   BUN 21 (H) 6 - 20 mg/dL   Creatinine, Ser 1.80 (H) 0.44 - 1.00 mg/dL   Calcium 9.1 8.9 - 10.3 mg/dL   Total Protein 6.3 (L) 6.5 - 8.1 g/dL   Albumin 3.4 (L) 3.5 - 5.0 g/dL   AST 44 (H) 15 - 41 U/L   ALT 23 14 - 54 U/L    Comment: CORRECTED RESULTS CALLED TO: P WEST AT 1230 ON 05.02.2016 BY NBROOKS CORRECTED ON 05/02 AT 1232: PREVIOUSLY REPORTED AS <5    Alkaline Phosphatase 47 38 - 126 U/L   Total Bilirubin 0.8 0.3 - 1.2 mg/dL   GFR calc non Af Amer 25 (L) >60 mL/min   GFR calc Af Amer 29 (L) >60 mL/min    Comment: (NOTE) The eGFR has been calculated  using the CKD EPI equation. This calculation has not been validated in all clinical situations. eGFR's persistently <90 mL/min signify possible Chronic Kidney Disease.    Anion gap 20 (H) 5 - 15  Magnesium     Status: None   Collection Time: 10/29/14  4:59 PM  Result Value Ref Range   Magnesium 1.9 1.7 - 2.4 mg/dL  Phosphorus      Status: Abnormal   Collection Time: 10/29/14  4:59 PM  Result Value Ref Range   Phosphorus 4.8 (H) 2.5 - 4.6 mg/dL  Comprehensive metabolic panel     Status: Abnormal   Collection Time: 10/30/14  4:14 AM  Result Value Ref Range   Sodium 138 135 - 145 mmol/L   Potassium 4.0 3.5 - 5.1 mmol/L   Chloride 101 101 - 111 mmol/L   CO2 26 22 - 32 mmol/L   Glucose, Bld 175 (H) 70 - 99 mg/dL   BUN 33 (H) 6 - 20 mg/dL   Creatinine, Ser 1.51 (H) 0.44 - 1.00 mg/dL   Calcium 10.2 8.9 - 10.3 mg/dL   Total Protein 6.7 6.5 - 8.1 g/dL   Albumin 3.3 (L) 3.5 - 5.0 g/dL   AST 41 15 - 41 U/L   ALT 25 14 - 54 U/L   Alkaline Phosphatase 48 38 - 126 U/L   Total Bilirubin 0.9 0.3 - 1.2 mg/dL   GFR calc non Af Amer 31 (L) >60 mL/min   GFR calc Af Amer 36 (L) >60 mL/min    Comment: (NOTE) The eGFR has been calculated using the CKD EPI equation. This calculation has not been validated in all clinical situations. eGFR's persistently <90 mL/min signify possible Chronic Kidney Disease.    Anion gap 11 5 - 15  CBC     Status: Abnormal   Collection Time: 10/30/14  4:14 AM  Result Value Ref Range   WBC 21.6 (H) 4.0 - 10.5 K/uL   RBC 4.13 3.87 - 5.11 MIL/uL   Hemoglobin 11.4 (L) 12.0 - 15.0 g/dL   HCT 36.5 36.0 - 46.0 %   MCV 88.4 78.0 - 100.0 fL   MCH 27.6 26.0 - 34.0 pg   MCHC 31.2 30.0 - 36.0 g/dL   RDW 15.5 11.5 - 15.5 %   Platelets 117 (L) 150 - 400 K/uL    Comment: CONSISTENT WITH PREVIOUS RESULT  Troponin I     Status: Abnormal   Collection Time: 10/30/14  6:55 AM  Result Value Ref Range   Troponin I 0.71 (HH) <0.031 ng/mL    Comment:        POSSIBLE MYOCARDIAL ISCHEMIA. SERIAL TESTING RECOMMENDED. REPEATED TO VERIFY CRITICAL RESULT CALLED TO, READ BACK BY AND VERIFIED WITH: WEST,P. RN AT 0825 10/30/14 MULLINS,T   Glucose, capillary     Status: Abnormal   Collection Time: 10/30/14  7:44 AM  Result Value Ref Range   Glucose-Capillary 162 (H) 70 - 99 mg/dL   Comment 1 Notify RN   Type and  screen     Status: None (Preliminary result)   Collection Time: 10/30/14 10:01 AM  Result Value Ref Range   ABO/RH(D) O NEG    Antibody Screen PENDING    Sample Expiration 11/02/2014   Occult blood gastric / duodenum     Status: None   Collection Time: 10/30/14 10:56 AM  Result Value Ref Range   pH, Gastric 4    Occult Blood, Gastric NEGATIVE NEGATIVE   Dg Chest Port 1 View  10/29/2014  CLINICAL DATA:  Respiratory distress.  Cough.  EXAM: PORTABLE CHEST - 1 VIEW  COMPARISON:  01/23/2013  FINDINGS: The heart is enlarged. Large hiatal hernia is again noted. There is prominence of interstitial markings consistent with interstitial edema.  IMPRESSION: 1. Cardiomegaly and mild edema. 2. Large hiatal hernia.   Electronically Signed   By: Nolon Nations M.D.   On: 10/29/2014 10:51    ROS: General:+ colds and fevers, no weight changes Skin:no rashes or ulcers HEENT:no blurred vision, no congestion CV:see HPI PUL:see HPI GI:no diarrhea constipation or melena, no indigestion GU:no hematuria, no dysuria MS:no joint pain, no claudication, no lower ext edema Neuro:no syncope, no lightheadedness Endo:no diabetes, + thyroid disease   Blood pressure 156/59, pulse 109, temperature 98.1 F (36.7 C), temperature source Oral, resp. rate 26, height $RemoveBe'5\' 6"'CxzvdVHnB$  (1.676 m), weight 156 lb 8.4 oz (71 kg), SpO2 97 %.  Wt Readings from Last 3 Encounters:  10/29/14 156 lb 8.4 oz (71 kg)  10/04/14 158 lb (71.668 kg)  09/05/14 158 lb (71.668 kg)    PE: General:Pleasant affect, NAD, very freq cough Skin:Warm and dry, brisk capillary refill, color flushed HEENT:normocephalic, sclera clear, mucus membranes moist Neck:supple, no JVD, no bruits  Heart:S1S2 RRR without murmur, gallup, rub or click Lungs: diminished ,with + rhonchi, and wheezes HLK:TGYB, non tender, + BS, do not palpate liver spleen or masses Ext:no lower ext edema, 2+ pedal pulses, 2+ radial pulses, scd stockings in place.  Neuro:alert and  oriented X 3, MAE, follows commands, + facial symmetry    Assessment/Plan Principal Problem:   Hypoxia- improved off of BiPAP, on nasal cannula  Active Problems:   RA (rheumatoid arthritis)   Coronary atherosclerosis of native coronary artery with NSTEMI in 06/2010 with troponin 0.72 with apical LAD of 95% stenosis in small vessel.  All other CAD was non obstructive.  nuc later that year neg ischemia.  Would repeat nuc study once recovered from pulmonary, unless echo abnormal.   MD to see and clarify.  Was on ASA 81 mg. But stopped due to coffee ground emesis.    Essential hypertension- elevated here to labile   Sepsis on ABX lactic Acid 8.7   Elevated troponin- 0.71, continue serial troponin, may be demand ischemia with her CAD and hx on NSTEMI and neg nuc in 2012. Echo pending.  Hyperlipidemia on crestor recent LDL in 06/2014 was 59, HDL 52 TG 252 and TC [place]  Nurse Practitioner Certified [place] Pager [phone] or after 5pm or weekends call [phone] 10/30/2014, 12:39 PM    History and all data above reviewed.  Patient examined.  I agree with the findings as above.  The patient has not had any recent chest pain.  She is admitted with acute respiratory failure.   The patient exam reveals COR:RRR  ,  Lungs: Decreased breath sounds with expiratory wheezing a coarse crackles  ,  Abd: Positive bowel sounds, no rebound no guarding, Ext No edema  .  All available labs, radiology testing, previous records reviewed. Agree with documented assessment and plan. Mild troponin elevation.  I do not think that this represents acute coronary syndrome in this situation. I would not suspect that we will need further in patient evaluation.  Echo results are pending.    Jeneen Rinks [place]  2:18 PM  10/30/2014

## 2014-10-30 NOTE — Progress Notes (Signed)
Echocardiogram 2D Echocardiogram has been performed.  Tresa Res 10/30/2014, 1:12 PM

## 2014-10-30 NOTE — Progress Notes (Signed)
Patient Demographics  [name], is a 81 y.o. female, DOB - July 11, 1929, LAG:[id number]  Admit date - 10/29/2014   Admitting Physician Robbie Lis, MD  Outpatient Primary MD for the patient is [name], MD  LOS - 1   Chief Complaint  Patient presents with  . Respiratory Distress      Admission history of present illness/brief narrative: 81 year old female with history of remote CVA, hypertension, diastolic CHF, dyslipidemia , presented  with worsening shortness of breath, cough, fever and chills and productive cough. S. No reports of blood in stool or urine. No reports of diarrhea or constipation. On EMS arrival she was given albuterol, Atrovent, solumedrol.  In ED, BP was 111/54, HR 110-149, RR 19-41, T max 101.62F and oxygen saturation 86% on room air. She required BiPAP to keep O2 sats above 90%.She was given nebulizers in ED, lasix 40 mg IV once, cefepime and vanco. She was admitted to SDU since she is on BiPAP.\ Patient respiratory status improved, currently tolerating nasal cannula, and 5/2 AM patient had an episode of coffee-ground emesis, as well as her troponins continues to increase, even though she still denies any chest pain.  Subjective:   [name] today has, No headache, No chest pain, No abdominal pain - No Nausea, No new weakness tingling or numbness, still complains of cough and shortness of breath , denies any chest pain , had an episode of coffee-ground emesis 100 mL .  Assessment & Plan    Active Problems:   Hypoxia   Sepsis  Acute respiratory failure with hypoxia / Healthcare associated pneumonia - Pt presented with hypoxia on admission and oxygen saturation of 86% on room. Hypoxia likely from pneumonia.  - Initially on BiPAP, currently tolerating nasal cannula - CXR on admission showed cardiomegaly and mild edema. - Pt started  on empiric cefepime and vanco for possible HCAP.no evidence of pneumonia on Chest xray, we will check CT chest for further evaluation. - Follow up blood culture results, legionella, resp culture results. Influenza panel andstrep pneumonia is negative.  - Continue xopenex and atrovent nebs sch and as needed for shortness of breath or wheezing   Severe sepsis due to pneumonia / Leukocytosis  - Sepsis criteria met on admission with tachycardia, tachypnea, fever, hypoxia. In addition. Lactic acid was 8.7 and procalcitonin 0.21, patient has worsening leukocytosis, but this is most likely in the setting of saving IV steroids yesterday, will continue to monitor closely. - Follow up blood culture results - Continue empiric cefepime andvancomycin - IV fluids on hold due to concern for pulmonary edema which is seen on CXR  Elevated troponin - Likely demand ischemia fro sepsis , hypoxia and pneumonia - troponin continued to trend up, initially 0.15, this a.m. Is  0.71 - No acute ischemic changes on 12 lead EKG - 2 D ECHO pending - held aspirin  given upper GI bleed - cardiology  An episode of coffee-ground emesis this a.m. - This is most likely related to gastritis, esophagitis, given she received IV steroids and full dose aspirin yesterday, start on Protonix drip, change to liquid diet, hold all forms of anticoagulation including aspirin and DVT prophylaxis. - Gastroenterology consulted  Acute on chronic diastolic CHF - BNP  on admission 1045. - She was given lasix 40 mg IV in ED - Please assess in am if renal function better and then may give lasix again potentially  - Daily weight and strict intake and output  Essential hypertension - Continue metoprolol   Dyslipidemia - Continue Crestor   Iron deficiency anemia anemia of chronic kidney disease  - Continue ferrous sulfate supplementation  - Hemoglobin stable  CKD, stage 4 - Baseline creatinine is 1.8, is to be  stable  Hypothyroidism - Continue synthroid    DVT prophylaxis:  - SCD's bilaterally   Code Status: Full  Family Communication: none at bedside  Disposition Plan:remains in stepdown   Procedures  None   Consults   Cardiology Gastroenterology   Medications  Scheduled Meds: . calcium-vitamin D  1 tablet Oral TID  . ceFEPime (MAXIPIME) IV  1 g Intravenous Q24H  . cholecalciferol  2,000 Units Oral Daily  . ferrous sulfate  325 mg Oral Q breakfast  . ipratropium  2 spray Each Nare Q12H  . ipratropium  0.5 mg Nebulization Q6H  . levalbuterol  1.25 mg Nebulization 4 times per day  . levothyroxine  50 mcg Oral QAC breakfast  . loratadine  10 mg Oral Daily  . LORazepam  0.5 mg Oral QHS  . methocarbamol  500 mg Oral QHS  . metoprolol tartrate  25 mg Oral BID  . montelukast  10 mg Oral QHS  . multivitamin with minerals  1 tablet Oral Daily  . [START ON 11/02/2014] pantoprazole (PROTONIX) IV  40 mg Intravenous Q12H  . rosuvastatin  20 mg Oral QPM  . saccharomyces boulardii  250 mg Oral BID  . sertraline  50 mg Oral q morning - 10a  . vancomycin  1,000 mg Intravenous Q24H   Continuous Infusions: . pantoprozole (PROTONIX) infusion 8 mg/hr (10/30/14 1035)   PRN Meds:.acetaminophen, camphor-menthol, dextromethorphan-guaiFENesin, hydrALAZINE, ipratropium, levalbuterol, polyvinyl alcohol, promethazine  DVT Prophylaxis   SCDs   Lab Results  Component Value Date   PLT 117* 10/30/2014    Antibiotics   Anti-infectives    Start     Dose/Rate Route Frequency Ordered Stop   10/30/14 1200  ceFEPIme (MAXIPIME) 1 g in dextrose 5 % 50 mL IVPB     1 g 100 mL/hr over 30 Minutes Intravenous Every 24 hours 10/29/14 1835     10/30/14 1100  vancomycin (VANCOCIN) IVPB 1000 mg/200 mL premix  Status:  Discontinued     1,000 mg 200 mL/hr over 60 Minutes Intravenous Every 24 hours 10/29/14 1147 10/29/14 1458   10/30/14 1100  ceFEPIme (MAXIPIME) 1 g in dextrose 5 % 50 mL IVPB  Status:   Discontinued     1 g 100 mL/hr over 30 Minutes Intravenous Every 24 hours 10/29/14 1147 10/29/14 1458   10/30/14 1100  vancomycin (VANCOCIN) IVPB 1000 mg/200 mL premix     1,000 mg 200 mL/hr over 60 Minutes Intravenous Every 24 hours 10/29/14 1835     10/29/14 1300  ceFEPIme (MAXIPIME) 2 g in dextrose 5 % 50 mL IVPB  Status:  Discontinued     2 g 100 mL/hr over 30 Minutes Intravenous  Once 10/29/14 1258 10/29/14 1304   10/29/14 1300  vancomycin (VANCOCIN) IVPB 1000 mg/200 mL premix  Status:  Discontinued     1,000 mg 200 mL/hr over 60 Minutes Intravenous  Once 10/29/14 1258 10/29/14 1304   10/29/14 1045  ceFEPIme (MAXIPIME) 2 g in dextrose 5 % 50 mL IVPB  2 g 100 mL/hr over 30 Minutes Intravenous  Once 10/29/14 1032 10/29/14 1220   10/29/14 1045  vancomycin (VANCOCIN) IVPB 1000 mg/200 mL premix     1,000 mg 200 mL/hr over 60 Minutes Intravenous  Once 10/29/14 1032 10/29/14 1220          Objective:   Filed Vitals:   10/30/14 0845 10/30/14 0933 10/30/14 0937 10/30/14 1154  BP:  203/78 203/78   Pulse:      Temp:    98.1 F (36.7 C)  TempSrc:    Oral  Resp:      Height:      Weight:      SpO2: 95%       Wt Readings from Last 3 Encounters:  10/29/14 71 kg (156 lb 8.4 oz)  10/04/14 71.668 kg (158 lb)  09/05/14 71.668 kg (158 lb)     Intake/Output Summary (Last 24 hours) at 10/30/14 1226 Last data filed at 10/29/14 2200  Gross per 24 hour  Intake  317.5 ml  Output    101 ml  Net  216.5 ml     Physical Exam  Awake Alert, Oriented,in mild resp distress coughing, No new F.N deficits, Normal affect [place].AT,PERRAL Supple Neck,No JVD, No cervical lymphadenopathy appriciated.  Symmetrical Chest wall movement, bibasilar rales, no wheezing, minimal use of accessory muscles. tachycardic,No Gallops,Rubs or new Murmurs, No Parasternal Heave +ve B.Sounds, Abd Soft, No tenderness, No organomegaly appriciated, No rebound - guarding or rigidity. No Cyanosis, Clubbing or  edema, No new Rash or bruise    Data Review   Micro Results Recent Results (from the past 240 hour(s))  Culture, blood (x 2)     Status: None (Preliminary result)   Collection Time: 10/29/14  1:23 PM  Result Value Ref Range Status   Specimen Description BLOOD RIGHT ANTECUBITAL  Final   Special Requests BOTTLES DRAWN AEROBIC AND ANAEROBIC 5CC EACH  Final   Culture   Final           BLOOD CULTURE RECEIVED NO GROWTH TO DATE CULTURE WILL BE HELD FOR 5 DAYS BEFORE ISSUING A FINAL NEGATIVE REPORT Performed at [facility]    Report Status PENDING  Incomplete  Culture, blood (x 2)     Status: None (Preliminary result)   Collection Time: 10/29/14  1:40 PM  Result Value Ref Range Status   Specimen Description BLOOD RIGHT WRIST  Final   Special Requests BOTTLES DRAWN AEROBIC AND ANAEROBIC 5CC EACH  Final   Culture   Final           BLOOD CULTURE RECEIVED NO GROWTH TO DATE CULTURE WILL BE HELD FOR 5 DAYS BEFORE ISSUING A FINAL NEGATIVE REPORT Performed at [facility]    Report Status PENDING  Incomplete  MRSA PCR Screening     Status: None   Collection Time: 10/29/14  3:07 PM  Result Value Ref Range Status   MRSA by PCR NEGATIVE NEGATIVE Final    Comment:        The GeneXpert MRSA Assay (FDA approved for NASAL specimens only), is one component of a comprehensive MRSA colonization surveillance program. It is not intended to diagnose MRSA infection nor to guide or monitor treatment for MRSA infections.     Radiology Reports Dg Chest Port 1 View  10/29/2014   CLINICAL DATA:  Respiratory distress.  Cough.  EXAM: PORTABLE CHEST - 1 VIEW  COMPARISON:  01/23/2013  FINDINGS: The heart is enlarged. Large hiatal hernia is again  noted. There is prominence of interstitial markings consistent with interstitial edema.  IMPRESSION: 1. Cardiomegaly and mild edema. 2. Large hiatal hernia.   Electronically Signed   By: Nolon Nations M.D.   On: 10/29/2014 10:51    CBC  Recent  Labs Lab 10/29/14 1029 10/30/14 0414  WBC 14.1* 21.6*  HGB 11.8* 11.4*  HCT 38.2 36.5  PLT 110* 117*  MCV 90.5 88.4  MCH 28.0 27.6  MCHC 30.9 31.2  RDW 15.7* 15.5  LYMPHSABS 1.6  --   MONOABS 1.0  --   EOSABS 0.1  --   BASOSABS 0.0  --     Chemistries   Recent Labs Lab 10/29/14 1029 10/29/14 1659 10/30/14 0414  NA 138 137 138  K 4.4 4.1 4.0  CL 109 104 101  CO2 23 13* 26  GLUCOSE 133* 352* 175*  BUN 17 21* 33*  CREATININE 1.19* 1.80* 1.51*  CALCIUM 8.5* 9.1 10.2  MG  --  1.9  --   AST 39 44* 41  ALT 19 <5* 25  ALKPHOS 47 47 48  BILITOT 1.5* 0.8 0.9   ------------------------------------------------------------------------------------------------------------------ estimated creatinine clearance is 26.9 mL/min (by C-G formula based on Cr of 1.51). ------------------------------------------------------------------------------------------------------------------ No results for input(s): HGBA1C in the last 72 hours. ------------------------------------------------------------------------------------------------------------------ No results for input(s): CHOL, HDL, LDLCALC, TRIG, CHOLHDL, LDLDIRECT in the last 72 hours. ------------------------------------------------------------------------------------------------------------------ No results for input(s): TSH, T4TOTAL, T3FREE, THYROIDAB in the last 72 hours.  Invalid input(s): FREET3 ------------------------------------------------------------------------------------------------------------------ No results for input(s): VITAMINB12, FOLATE, FERRITIN, TIBC, IRON, RETICCTPCT in the last 72 hours.  Coagulation profile  Recent Labs Lab 10/29/14 1029  INR 1.02    No results for input(s): DDIMER in the last 72 hours.  Cardiac Enzymes  Recent Labs Lab 10/29/14 1029 10/30/14 0655  TROPONINI 0.15* 0.71*    ------------------------------------------------------------------------------------------------------------------ Invalid input(s): POCBNP     Time Spent in minutes   40  minutes   [name] M.D on 10/30/2014 at 12:26 PM  Between 7am to 7pm - Pager - [phone]  After 7pm go to [email or web address] - password [email or web address]  And look for the night coverage person covering for me after hours  [facility] Office  [phone]   **Disclaimer: This note may have been dictated with voice recognition software. Similar sounding words can inadvertently be transcribed and this note may contain transcription errors which may not have been corrected upon publication of note.**

## 2014-10-30 NOTE — Consult Note (Signed)
[facility] CONSULT Reason for consult: Hematemesis Referring Physician: [facility] hospital[facility]. PCP: Dr. Bubba Camp. Primary G.I.: Dr. Teena Irani  [name] is an 81 y.o. female.  HPI: she has seen my partner Dr. Teena Irani for many years and has had previous EGD's colonoscopies. Less colonoscopy 2006 negative other than internal hemorrhoids. Last EGD 2008 showed massive hiatal hernia. Patient has had several barium swallows for difficulty swallowing all showing a very large hiatal hernia. She's been on chronic anti-reflux medication for some time and according to the records her preadmission medications including proton pump inhibitors on a regular basis. It appears that she was taking Nexium 40 mg BID at the nursing home. She was admitted with hypoxemia and pneumonia leukocytosis improbable sepsis. Her troponin T slightly elevated and she was given aspirin. She is on antibiotics. She has been seen by cardiology. She has known CAD and has had previous NSTEMI. We were asked to see her because of some hematemesis of a small amount of dark material after swallowing pills. Patient states that she does have chronic problems swallowing dysphagia for both liquids and solids with frequent regurgitation. Her heartburn seems to be unreasonably good control with her preadmission Nexium that she still has water brash and occasional regurgitation. She denies abdominal pain, melena or hematochezia. Hemoglobin is 11.4 but WBC is markedly elevated 22. Multiple other problems including history rheumatoid arthritis chronic renal insufficiency, depression and anxiety, history of CVA TIAs as well as DVT's requiring antiplatelet agents. She has had recurrent bouts of C. difficile diarrhea.  Past Medical History  Diagnosis Date  . SOB (shortness of breath)   . MI (myocardial infarction)   . Poor appetite   . Arthritis   . Fatigue   . Right ear pain   . Renal insufficiency   . RA (rheumatoid arthritis)   . PUD  (peptic ulcer disease)   . GI bleed 2005  . GERD (gastroesophageal reflux disease)   . Hiatal hernia   . Hypothyroidism   . Osteoporosis   . Depression   . Anxiety   . Coronary artery disease   . CVA (cerebral vascular accident)   . TIA (transient ischemic attack)   . Fall   . DVT of lower extremity (deep venous thrombosis)   . HCAP (healthcare-associated pneumonia) 01/22/2013  . Enteritis due to Clostridium difficile 01/04/2013  . Sepsis 12/19/2012  . Recurrent colitis due to Clostridium difficile 06/02/2012  . Hypertension   . Hyperlipidemia   . Fatigue   . Weight loss   . Hemorrhoids   . Malnutrition   . Dyspnea     Past Surgical History  Procedure Laterality Date  . Nstemi  06/2010  . Spinal fusion surgery    . Knee surgery    . Cardiac catheterization      SHOWED RUPTURE PLAQUE IN THE LAD. THE LAD IS NONOBSTRUCTIVE WITH ONLY 30-40% STENOSIS  . Back surgery    . Breast surgery  1964    x3  . Ankle surgery    . Colonoscopy      Family History  Problem Relation Age of Onset  . Kidney disease Mother   . Kidney disease Brother   . Lung cancer Father     Social History:  reports that she has never smoked. She has never used smokeless tobacco. She reports that she does not drink alcohol or use illicit drugs.  Allergies:  Allergies  Allergen Reactions  . Biphosphate   . Codeine     sick  .  Morphine And Related Other (See Comments)    unknown  . Percocet [Oxycodone-Acetaminophen]     unknown  . Plavix [Clopidogrel Bisulfate] Other (See Comments)    unknown  . Sulfur Other (See Comments)    unknown    Medications; Prior to Admission medications   Medication Sig Start Date End Date Taking? Authorizing Provider  albuterol (PROVENTIL HFA;VENTOLIN HFA) 108 (90 BASE) MCG/ACT inhaler Inhale 1 puff into the lungs every 6 (six) hours as needed for wheezing or shortness of breath.   Yes Historical Provider, MD  aspirin 81 MG chewable tablet Chew 81 mg by mouth daily.    Yes Historical Provider, MD  calcium-vitamin D (OSCAL WITH D) 500-200 MG-UNIT per tablet Take 1 tablet by mouth 3 (three) times daily.   Yes Historical Provider, MD  cetirizine (ZYRTEC) 10 MG tablet Take 10 mg by mouth daily.   Yes Historical Provider, MD  Cholecalciferol (VITAMIN D) 2000 UNITS CAPS Take 1 capsule (2,000 Units total) by mouth daily. 11/21/13  Yes [name], NP  Cranberry 200 MG CAPS Take 2 capsules by mouth 2 (two) times daily.   Yes Historical Provider, MD  dextromethorphan-guaiFENesin (MUCINEX DM) 30-600 MG per 12 hr tablet Take 1 tablet by mouth 2 (two) times daily as needed for cough.   Yes Historical Provider, MD  esomeprazole (NEXIUM) 20 MG capsule Take 20 mg by mouth 2 (two) times daily before a meal.   Yes Historical Provider, MD  ferrous sulfate 325 (65 FE) MG tablet Take 325 mg by mouth daily with breakfast.   Yes Historical Provider, MD  HYDROcodone-acetaminophen (NORCO/VICODIN) 5-325 MG per tablet Take one tablet by mouth every 4 to 6 hours as needed for pain. Do not exceed 4gm of Tylenol in 24 hours 07/19/14  Yes Gildardo Cranker, DO  ipratropium (ATROVENT) 0.03 % nasal spray Place 2 sprays into both nostrils every 12 (twelve) hours.   Yes Historical Provider, MD  ipratropium-albuterol (DUONEB) 0.5-2.5 (3) MG/3ML SOLN Inhale 3 mLs into the lungs every 6 (six) hours as needed (wheezing/shortness of breath). Currenlty running, Every 6 hours for 3 days. Course to be completed on 10/28/14- 10/30/14 10/27/14  Yes Historical Provider, MD  levothyroxine (SYNTHROID, LEVOTHROID) 50 MCG tablet Take 50 mcg by mouth daily.    Yes Historical Provider, MD  Lidocaine, Anorectal, 5 % CREA Apply 1 application topically 4 (four) times daily as needed.   Yes Historical Provider, MD  LORazepam (ATIVAN) 0.5 MG tablet Take one tablet by mouth every night at bedtime for anxiety 09/04/14  Yes Tiffany L Reed, DO  Menthol, Topical Analgesic, 4 % GEL Apply 1 application topically 3 (three) times daily  as needed (may leave at bedside).   Yes Historical Provider, MD  methocarbamol (ROBAXIN) 500 MG tablet Take 500 mg by mouth at bedtime.    Yes Historical Provider, MD  metoprolol tartrate (LOPRESSOR) 25 MG tablet Take 25 mg by mouth 2 (two) times daily.   Yes Historical Provider, MD  montelukast (SINGULAIR) 10 MG tablet Take 10 mg by mouth at bedtime.   Yes Historical Provider, MD  Multiple Vitamin (MULTIVITAMIN) tablet Take 1 tablet by mouth daily.     Yes Historical Provider, MD  Omega-3 Fatty Acids (FISH OIL) 1200 MG CAPS Take 1,200 mg by mouth daily.   Yes Historical Provider, MD  Polyethyl Glycol-Propyl Glycol (SYSTANE) 0.4-0.3 % SOLN Apply 1 drop to eye 2 (two) times daily.   Yes Historical Provider, MD  predniSONE (DELTASONE) 10 MG tablet  Take 10 mg by mouth daily.   Yes Historical Provider, MD  promethazine (PHENERGAN) 25 MG tablet Take 25 mg by mouth every 6 (six) hours as needed for nausea or vomiting.   Yes Historical Provider, MD  rosuvastatin (CRESTOR) 20 MG tablet Take 20 mg by mouth every evening.  07/05/13  Yes [name], NP  saccharomyces boulardii (FLORASTOR) 250 MG capsule Take 250 mg by mouth 2 (two) times daily.    Yes Historical Provider, MD  sertraline (ZOLOFT) 50 MG tablet Take 50 mg by mouth every morning.   Yes Historical Provider, MD   . aspirin  81 mg Oral Once  . calcium-vitamin D  1 tablet Oral TID  . ceFEPime (MAXIPIME) IV  1 g Intravenous Q24H  . cholecalciferol  2,000 Units Oral Daily  . ferrous sulfate  325 mg Oral Q breakfast  . ipratropium  2 spray Each Nare Q12H  . ipratropium  0.5 mg Nebulization Q6H  . levalbuterol  1.25 mg Nebulization 4 times per day  . levothyroxine  50 mcg Oral QAC breakfast  . loratadine  10 mg Oral Daily  . LORazepam  0.5 mg Oral QHS  . methocarbamol  500 mg Oral QHS  . metoprolol tartrate  25 mg Oral BID  . montelukast  10 mg Oral QHS  . multivitamin with minerals  1 tablet Oral Daily  . [START ON 11/02/2014] pantoprazole  (PROTONIX) IV  40 mg Intravenous Q12H  . rosuvastatin  20 mg Oral QPM  . saccharomyces boulardii  250 mg Oral BID  . sertraline  50 mg Oral q morning - 10a  . vancomycin  1,000 mg Intravenous Q24H   PRN Meds acetaminophen, camphor-menthol, dextromethorphan-guaiFENesin, hydrALAZINE, ipratropium, levalbuterol, polyvinyl alcohol, promethazine Results for orders placed or performed during the hospital encounter of 10/29/14 (from the past 48 hour(s))  Blood gas, arterial     Status: Abnormal   Collection Time: 10/29/14 10:25 AM  Result Value Ref Range   O2 Content 8.0 L/min   Delivery systems OXYGEN MASK    pH, Arterial 7.346 (L) 7.350 - 7.450   pCO2 arterial 41.8 35.0 - 45.0 mmHg   pO2, Arterial 142 (H) 80.0 - 100.0 mmHg   Bicarbonate 22.3 20.0 - 24.0 mEq/L   TCO2 20.4 0 - 100 mmol/L   Acid-base deficit 2.7 (H) 0.0 - 2.0 mmol/L   O2 Saturation 98.1 %   Patient temperature 98.6    Collection site BRACHIAL ARTERY    Drawn by [id number]    Sample type ARTERIAL DRAW    Allens test (pass/fail) PASS PASS  Brain natriuretic peptide     Status: Abnormal   Collection Time: 10/29/14 10:27 AM  Result Value Ref Range   B Natriuretic Peptide 1045.3 (H) 0.0 - 100.0 pg/mL  Comprehensive metabolic panel     Status: Abnormal   Collection Time: 10/29/14 10:29 AM  Result Value Ref Range   Sodium 138 135 - 145 mmol/L   Potassium 4.4 3.5 - 5.1 mmol/L   Chloride 109 101 - 111 mmol/L   CO2 23 22 - 32 mmol/L   Glucose, Bld 133 (H) 70 - 99 mg/dL   BUN 17 6 - 20 mg/dL   Creatinine, Ser 1.19 (H) 0.44 - 1.00 mg/dL   Calcium 8.5 (L) 8.9 - 10.3 mg/dL   Total Protein 6.3 (L) 6.5 - 8.1 g/dL   Albumin 3.3 (L) 3.5 - 5.0 g/dL   AST 39 15 - 41 U/L   ALT 19 14 -  54 U/L   Alkaline Phosphatase 47 38 - 126 U/L   Total Bilirubin 1.5 (H) 0.3 - 1.2 mg/dL   GFR calc non Af Amer 41 (L) >60 mL/min   GFR calc Af Amer 48 (L) >60 mL/min    Comment: (NOTE) The eGFR has been calculated using the CKD EPI equation. This  calculation has not been validated in all clinical situations. eGFR's persistently <90 mL/min signify possible Chronic Kidney Disease.    Anion gap 6 5 - 15  Troponin I     Status: Abnormal   Collection Time: 10/29/14 10:29 AM  Result Value Ref Range   Troponin I 0.15 (H) <0.031 ng/mL    Comment:        PERSISTENTLY INCREASED TROPONIN VALUES IN THE RANGE OF 0.04-0.49 ng/mL CAN BE SEEN IN:       -UNSTABLE ANGINA       -CONGESTIVE HEART FAILURE       -MYOCARDITIS       -CHEST TRAUMA       -ARRYHTHMIAS       -LATE PRESENTING MYOCARDIAL INFARCTION       -COPD   CLINICAL FOLLOW-UP RECOMMENDED.   CBC with Differential     Status: Abnormal   Collection Time: 10/29/14 10:29 AM  Result Value Ref Range   WBC 14.1 (H) 4.0 - 10.5 K/uL   RBC 4.22 3.87 - 5.11 MIL/uL   Hemoglobin 11.8 (L) 12.0 - 15.0 g/dL   HCT 38.2 36.0 - 46.0 %   MCV 90.5 78.0 - 100.0 fL   MCH 28.0 26.0 - 34.0 pg   MCHC 30.9 30.0 - 36.0 g/dL   RDW 15.7 (H) 11.5 - 15.5 %   Platelets 110 (L) 150 - 400 K/uL    Comment: SPECIMEN CHECKED FOR CLOTS REPEATED TO VERIFY PLATELET COUNT CONFIRMED BY SMEAR    Neutrophils Relative % 81 (H) 43 - 77 %   Lymphocytes Relative 11 (L) 12 - 46 %   Monocytes Relative 7 3 - 12 %   Eosinophils Relative 1 0 - 5 %   Basophils Relative 0 0 - 1 %   Neutro Abs 11.4 (H) 1.7 - 7.7 K/uL   Lymphs Abs 1.6 0.7 - 4.0 K/uL   Monocytes Absolute 1.0 0.1 - 1.0 K/uL   Eosinophils Absolute 0.1 0.0 - 0.7 K/uL   Basophils Absolute 0.0 0.0 - 0.1 K/uL   Smear Review LARGE PLATELETS PRESENT   Protime-INR     Status: None   Collection Time: 10/29/14 10:29 AM  Result Value Ref Range   Prothrombin Time 13.6 11.6 - 15.2 seconds   INR 1.02 0.00 - 1.49  I-Stat CG4 Lactic Acid, ED     Status: Abnormal   Collection Time: 10/29/14 10:36 AM  Result Value Ref Range   Lactic Acid, Venous 2.35 (HH) 0.5 - 2.0 mmol/L   Comment NOTIFIED PHYSICIAN   Culture, blood (x 2)     Status: None (Preliminary result)    Collection Time: 10/29/14  1:23 PM  Result Value Ref Range   Specimen Description BLOOD RIGHT ANTECUBITAL    Special Requests BOTTLES DRAWN AEROBIC AND ANAEROBIC 5CC EACH    Culture             BLOOD CULTURE RECEIVED NO GROWTH TO DATE CULTURE WILL BE HELD FOR 5 DAYS BEFORE ISSUING A FINAL NEGATIVE REPORT Performed at [facility]    Report Status PENDING   Culture, blood (x 2)     Status:  None (Preliminary result)   Collection Time: 10/29/14  1:40 PM  Result Value Ref Range   Specimen Description BLOOD RIGHT WRIST    Special Requests BOTTLES DRAWN AEROBIC AND ANAEROBIC 5CC EACH    Culture             BLOOD CULTURE RECEIVED NO GROWTH TO DATE CULTURE WILL BE HELD FOR 5 DAYS BEFORE ISSUING A FINAL NEGATIVE REPORT Performed at [facility]    Report Status PENDING   Procalcitonin     Status: None   Collection Time: 10/29/14  1:47 PM  Result Value Ref Range   Procalcitonin 0.21 ng/mL    Comment:        Interpretation: PCT (Procalcitonin) <= 0.5 ng/mL: Systemic infection (sepsis) is not likely. Local bacterial infection is possible. (NOTE)         ICU PCT Algorithm               Non ICU PCT Algorithm    ----------------------------     ------------------------------         PCT < 0.25 ng/mL                 PCT < 0.1 ng/mL     Stopping of antibiotics            Stopping of antibiotics       strongly encouraged.               strongly encouraged.    ----------------------------     ------------------------------       PCT level decrease by               PCT < 0.25 ng/mL       >= 80% from peak PCT       OR PCT 0.25 - 0.5 ng/mL          Stopping of antibiotics                                             encouraged.     Stopping of antibiotics           encouraged.    ----------------------------     ------------------------------       PCT level decrease by              PCT >= 0.25 ng/mL       < 80% from peak PCT        AND PCT >= 0.5 ng/mL            Continuin g  antibiotics                                              encouraged.       Continuing antibiotics            encouraged.    ----------------------------     ------------------------------     PCT level increase compared          PCT > 0.5 ng/mL         with peak PCT AND          PCT >= 0.5 ng/mL             Escalation of antibiotics  strongly encouraged.      Escalation of antibiotics        strongly encouraged.   Lactic acid, plasma     Status: Abnormal   Collection Time: 10/29/14  1:48 PM  Result Value Ref Range   Lactic Acid, Venous 5.7 (HH) 0.5 - 2.0 mmol/L    Comment: REPEATED TO VERIFY CRITICAL RESULT CALLED TO, READ BACK BY AND VERIFIED WITH: M QUICK AT 1439 ON 05.01.2016 BY N BROOKS   MRSA PCR Screening     Status: None   Collection Time: 10/29/14  3:07 PM  Result Value Ref Range   MRSA by PCR NEGATIVE NEGATIVE    Comment:        The GeneXpert MRSA Assay (FDA approved for NASAL specimens only), is one component of a comprehensive MRSA colonization surveillance program. It is not intended to diagnose MRSA infection nor to guide or monitor treatment for MRSA infections.   Strep pneumoniae urinary antigen     Status: None   Collection Time: 10/29/14  3:25 PM  Result Value Ref Range   Strep Pneumo Urinary Antigen NEGATIVE NEGATIVE    Comment:        Infection due to S. pneumoniae cannot be absolutely ruled out since the antigen present may be below the detection limit of the test. Performed at [facility]   Influenza panel by pcr     Status: None   Collection Time: 10/29/14  3:25 PM  Result Value Ref Range   Influenza A By PCR NEGATIVE NEGATIVE   Influenza B By PCR NEGATIVE NEGATIVE   H1N1 flu by pcr NOT DETECTED NOT DETECTED    Comment:        The Xpert Flu assay (FDA approved for nasal aspirates or washes and nasopharyngeal swab specimens), is intended as an aid in the diagnosis of influenza and should not  be used as a sole basis for treatment. Performed at [facility]   Lactic acid, plasma     Status: Abnormal   Collection Time: 10/29/14  4:59 PM  Result Value Ref Range   Lactic Acid, Venous 8.7 (HH) 0.5 - 2.0 mmol/L    Comment: REPEATED TO VERIFY CRITICAL RESULT CALLED TO, READ BACK BY AND VERIFIED WITH: A KOONTZ AT 1802 ON 05.01.2016 BY NBROOKS   Comprehensive metabolic panel     Status: Abnormal   Collection Time: 10/29/14  4:59 PM  Result Value Ref Range   Sodium 137 135 - 145 mmol/L   Potassium 4.1 3.5 - 5.1 mmol/L   Chloride 104 101 - 111 mmol/L   CO2 13 (L) 22 - 32 mmol/L   Glucose, Bld 352 (H) 70 - 99 mg/dL   BUN 21 (H) 6 - 20 mg/dL   Creatinine, Ser 1.80 (H) 0.44 - 1.00 mg/dL   Calcium 9.1 8.9 - 10.3 mg/dL   Total Protein 6.3 (L) 6.5 - 8.1 g/dL   Albumin 3.4 (L) 3.5 - 5.0 g/dL   AST 44 (H) 15 - 41 U/L   ALT 23 14 - 54 U/L    Comment: CORRECTED RESULTS CALLED TO: P WEST AT 1230 ON 05.02.2016 BY NBROOKS CORRECTED ON 05/02 AT 1232: PREVIOUSLY REPORTED AS <5    Alkaline Phosphatase 47 38 - 126 U/L   Total Bilirubin 0.8 0.3 - 1.2 mg/dL   GFR calc non Af Amer 25 (L) >60 mL/min   GFR calc Af Amer 29 (L) >60 mL/min    Comment: (NOTE) The eGFR has been calculated  using the CKD EPI equation. This calculation has not been validated in all clinical situations. eGFR's persistently <90 mL/min signify possible Chronic Kidney Disease.    Anion gap 20 (H) 5 - 15  Magnesium     Status: None   Collection Time: 10/29/14  4:59 PM  Result Value Ref Range   Magnesium 1.9 1.7 - 2.4 mg/dL  Phosphorus     Status: Abnormal   Collection Time: 10/29/14  4:59 PM  Result Value Ref Range   Phosphorus 4.8 (H) 2.5 - 4.6 mg/dL  Comprehensive metabolic panel     Status: Abnormal   Collection Time: 10/30/14  4:14 AM  Result Value Ref Range   Sodium 138 135 - 145 mmol/L   Potassium 4.0 3.5 - 5.1 mmol/L   Chloride 101 101 - 111 mmol/L   CO2 26 22 - 32 mmol/L   Glucose, Bld 175 (H)  70 - 99 mg/dL   BUN 33 (H) 6 - 20 mg/dL   Creatinine, Ser 8.60 (H) 0.44 - 1.00 mg/dL   Calcium 37.2 8.9 - 00.5 mg/dL   Total Protein 6.7 6.5 - 8.1 g/dL   Albumin 3.3 (L) 3.5 - 5.0 g/dL   AST 41 15 - 41 U/L   ALT 25 14 - 54 U/L   Alkaline Phosphatase 48 38 - 126 U/L   Total Bilirubin 0.9 0.3 - 1.2 mg/dL   GFR calc non Af Amer 31 (L) >60 mL/min   GFR calc Af Amer 36 (L) >60 mL/min    Comment: (NOTE) The eGFR has been calculated using the CKD EPI equation. This calculation has not been validated in all clinical situations. eGFR's persistently <90 mL/min signify possible Chronic Kidney Disease.    Anion gap 11 5 - 15  CBC     Status: Abnormal   Collection Time: 10/30/14  4:14 AM  Result Value Ref Range   WBC 21.6 (H) 4.0 - 10.5 K/uL   RBC 4.13 3.87 - 5.11 MIL/uL   Hemoglobin 11.4 (L) 12.0 - 15.0 g/dL   HCT 26.0 00.0 - 86.3 %   MCV 88.4 78.0 - 100.0 fL   MCH 27.6 26.0 - 34.0 pg   MCHC 31.2 30.0 - 36.0 g/dL   RDW 54.6 06.9 - 57.6 %   Platelets 117 (L) 150 - 400 K/uL    Comment: CONSISTENT WITH PREVIOUS RESULT  Troponin I     Status: Abnormal   Collection Time: 10/30/14  6:55 AM  Result Value Ref Range   Troponin I 0.71 (HH) <0.031 ng/mL    Comment:        POSSIBLE MYOCARDIAL ISCHEMIA. SERIAL TESTING RECOMMENDED. REPEATED TO VERIFY CRITICAL RESULT CALLED TO, READ BACK BY AND VERIFIED WITH: WEST,P. RN AT 0825 10/30/14 MULLINS,T   Glucose, capillary     Status: Abnormal   Collection Time: 10/30/14  7:44 AM  Result Value Ref Range   Glucose-Capillary 162 (H) 70 - 99 mg/dL   Comment 1 Notify RN   Type and screen     Status: None   Collection Time: 10/30/14 10:01 AM  Result Value Ref Range   ABO/RH(D) O NEG    Antibody Screen NEG    Sample Expiration 11/02/2014   Occult blood gastric / duodenum     Status: None   Collection Time: 10/30/14 10:56 AM  Result Value Ref Range   pH, Gastric 4    Occult Blood, Gastric NEGATIVE NEGATIVE    Dg Chest Port 1 View  10/29/2014  CLINICAL DATA:  Respiratory distress.  Cough.  EXAM: PORTABLE CHEST - 1 VIEW  COMPARISON:  01/23/2013  FINDINGS: The heart is enlarged. Large hiatal hernia is again noted. There is prominence of interstitial markings consistent with interstitial edema.  IMPRESSION: 1. Cardiomegaly and mild edema. 2. Large hiatal hernia.   Electronically Signed   By: Nolon Nations M.D.   On: 10/29/2014 10:51   ROS: markedly positive as noted above            Blood pressure 156/59, pulse 109, temperature 98.1 F (36.7 C), temperature source Oral, resp. rate 26, height $RemoveBe'5\' 6"'nnYNuozOZ$  (1.676 m), weight 71 kg (156 lb 8.4 oz), SpO2 97 %.  Physical exam:   General--frail appearing white female who is short of breath ENT-- no coffee ground material in the mouth Neck-- obese neck no gross lymph nodes  Heart-- regular rate and rhythm without murmurs are gallops Lungs--course rhonchi bilaterally Abdomen-- mild on localizing tenderness Psych-- alert and oriented and follows commands no gross neurological deficits   Assessment: 1. Hematemesis. This is probably due to her large hiatal hernia possibly some esophagitis. She is not actively bleeding her hemoglobin stable that she has multiple other problems at this time. I would favor continued treatment with PPI therapy 2. Large hiatal hernia with chronic reflux 3. History of C. difficile infection 4. Pneumonia with hypoxemia 5. Multiple other problems as noted above  Plan: we will follow along with you. Because of her pulmonary disease of hypoxemia multiple other problems, I do not feel an EGD would be beneficial at this time she starts actively bleeding. I would continue her on current aggressive BID PPI therapy and will follow with you.   Kaniyah Lisby JR,Risha Barretta L 10/30/2014, 1:09 PM

## 2014-10-30 NOTE — Progress Notes (Signed)
Initial Nutrition Assessment  DOCUMENTATION CODES:  Not applicable  INTERVENTION:  Boost Breeze daily  NUTRITION DIAGNOSIS:  Inadequate oral intake related to inability to eat as evidenced by estimated needs.  GOAL:  Patient will meet greater than or equal to 90% of their needs  MONITOR:  PO intake, Supplement acceptance, Labs, Weight trends, Diet advancement  REASON FOR ASSESSMENT:  Malnutrition Screening Tool    ASSESSMENT: 81 year old female with past medical history of remote CVA, hypertension, diastolic CHF, dyslipidemia who presented from [facility] with worsening shortness of breath, cough, fever and chills for past few days piror to this admission. Shortness of breath is present at rest and it is worse with movement. Dx: severe sepsis due to pneumonia.  Pt is still feeling bad at time of visit. Per pt her SOB contributed to poor appetite in the last few days and she believes that she lost some weight, however this is not supported by diet history. Pt's diet has been advanced to clear liquids and she is interested in trying [employer], RD to order. Per RN, if tolerated diet is to advance further later today. Pt refused NFPE at this time. Labs reviewed: Glu 133 - 352, Cr 1.8, BUN 21  Height:  Ht Readings from Last 1 Encounters:  10/29/14 5\' 6"  (1.676 m)    Weight:  Wt Readings from Last 1 Encounters:  10/29/14 156 lb 8.4 oz (71 kg)    Ideal Body Weight:  59 kg  Wt Readings from Last 10 Encounters:  10/29/14 156 lb 8.4 oz (71 kg)  10/04/14 158 lb (71.668 kg)  09/05/14 158 lb (71.668 kg)  08/07/14 156 lb 3.2 oz (70.852 kg)  07/10/14 157 lb (71.215 kg)  06/28/14 157 lb 6.4 oz (71.396 kg)  06/08/14 157 lb 6.4 oz (71.396 kg)  05/10/14 155 lb (70.308 kg)  05/08/14 160 lb (72.576 kg)  04/13/14 155 lb (70.308 kg)    BMI:  Body mass index is 25.28 kg/(m^2).  Estimated Nutritional Needs:  Kcal:  1500 - 1700   Protein:  85 - 95 g  Fluid:  1.6  L  Skin:  Wound (see comment) (Bilateral ecchymosis on arm)  Diet Order:  Diet clear liquid Room service appropriate?: Yes; Fluid consistency:: Thin  EDUCATION NEEDS:  No education needs identified at this time   Intake/Output Summary (Last 24 hours) at 10/30/14 1449 Last data filed at 10/30/14 1200  Gross per 24 hour  Intake 552.92 ml  Output    101 ml  Net 451.92 ml    Last BM:  PTA  Vanshika Jastrzebski A. [place] Dietetic Intern Pager: [phone] 10/30/2014 2:58 PM

## 2014-10-30 NOTE — Progress Notes (Signed)
Patient is A/Ox4. Denies pain. Report given by G.Garland.

## 2014-10-31 LAB — BASIC METABOLIC PANEL
ANION GAP: 9 (ref 5–15)
BUN: 49 mg/dL — ABNORMAL HIGH (ref 6–20)
CALCIUM: 9.2 mg/dL (ref 8.9–10.3)
CO2: 28 mmol/L (ref 22–32)
Chloride: 103 mmol/L (ref 101–111)
Creatinine, Ser: 1.83 mg/dL — ABNORMAL HIGH (ref 0.44–1.00)
GFR calc non Af Amer: 25 mL/min — ABNORMAL LOW (ref >60)
GFR, EST AFRICAN AMERICAN: 28 mL/min — AB (ref >60)
Glucose, Bld: 112 mg/dL — ABNORMAL HIGH (ref 70–99)
Potassium: 4.2 mmol/L (ref 3.5–5.1)
Sodium: 140 mmol/L (ref 135–145)

## 2014-10-31 LAB — CBC
HEMATOCRIT: 34.6 % — AB (ref 36.0–46.0)
HEMOGLOBIN: 10.5 g/dL — AB (ref 12.0–15.0)
MCH: 27.6 pg (ref 26.0–34.0)
MCHC: 30.3 g/dL (ref 30.0–36.0)
MCV: 90.8 fL (ref 78.0–100.0)
PLATELETS: 112 10*3/uL — AB (ref 150–400)
RBC: 3.81 MIL/uL — ABNORMAL LOW (ref 3.87–5.11)
RDW: 16 % — ABNORMAL HIGH (ref 11.5–15.5)
WBC: 16.5 10*3/uL — AB (ref 4.0–10.5)

## 2014-10-31 LAB — GLUCOSE, CAPILLARY
GLUCOSE-CAPILLARY: 119 mg/dL — AB (ref 70–99)
Glucose-Capillary: 119 mg/dL — ABNORMAL HIGH (ref 70–99)

## 2014-10-31 LAB — TROPONIN I
TROPONIN I: 0.47 ng/mL — AB (ref <0.031)
Troponin I: 0.56 ng/mL (ref <0.031)
Troponin I: 0.38 ng/mL — ABNORMAL HIGH (ref <0.031)

## 2014-10-31 MED ORDER — SODIUM CHLORIDE 0.9 % IV SOLN
INTRAVENOUS | Status: DC
  Administered 2014-10-31: 07:00:00 via INTRAVENOUS

## 2014-10-31 MED ORDER — SODIUM CHLORIDE 0.9 % IV SOLN: INTRAVENOUS | Status: DC

## 2014-10-31 MED ORDER — SODIUM CHLORIDE 0.9 % IV SOLN
INTRAVENOUS | Status: DC
  Administered 2014-10-31: 17:00:00 via INTRAVENOUS

## 2014-10-31 NOTE — Progress Notes (Signed)
[facility] PROGRESS NOTE Subjective No bleeding tolerating clears is hungry. Still SOB  Objective: Vital signs in last 24 hours: Temp:  [97.8 F (36.6 C)-100.7 F (38.2 C)] 100.7 F (38.2 C) (05/03 0800) Pulse Rate:  [98-131] 113 (05/03 0800) Resp:  [16-27] 27 (05/03 0800) BP: (85-161)/(39-136) 160/61 mmHg (05/03 0800) SpO2:  [92 %-99 %] 96 % (05/03 0852) Weight:  [72.4 kg (159 lb 9.8 oz)] 72.4 kg (159 lb 9.8 oz) (05/03 0500) Last BM Date: 10/28/14  Intake/Output from previous day: 05/02 0701 - 05/03 0700 In: 995.4 [P.O.:360; I.V.:385.4; IV Piggyback:250] Out: -  Intake/Output this shift: Total I/O In: 320 [P.O.:120; I.V.:200] Out: -   PE: General--alert Ox3 Heart--RRR Lungs--still bilateral wheezing Abdomen--nontender  Lab Results:  Recent Labs  10/29/14 1029 10/30/14 0414 10/30/14 1447 10/31/14 0500  WBC 14.1* 21.6*  --  16.5*  HGB 11.8* 11.4* 10.9* 10.5*  HCT 38.2 36.5 35.3* 34.6*  PLT 110* 117*  --  112*   BMET  Recent Labs  10/29/14 1029 10/29/14 1659 10/30/14 0414 10/31/14 0500  NA 138 137 138 140  K 4.4 4.1 4.0 4.2  CL 109 104 101 103  CO2 23 13* 26 28  CREATININE 1.19* 1.80* 1.51* 1.83*   LFT  Recent Labs  10/29/14 1029 10/29/14 1659 10/30/14 0414  PROT 6.3* 6.3* 6.7  AST 39 44* 41  ALT 19 23 25   ALKPHOS 47 47 48  BILITOT 1.5* 0.8 0.9   PT/INR  Recent Labs  10/29/14 1029  LABPROT 13.6  INR 1.02   PANCREAS No results for input(s): LIPASE in the last 72 hours.       Studies/Results: Ct Chest Wo Contrast  10/30/2014   CLINICAL DATA:  Nonproductive cough, fever, sore throat, history coronary disease post MI, GERD, stroke, hypertension, rheumatoid arthritis  EXAM: CT CHEST WITHOUT CONTRAST  TECHNIQUE: Multidetector CT imaging of the chest was performed following the standard protocol without IV contrast. Sagittal and coronal MPR images reconstructed from axial data set. Images were repeated due to coughing.   COMPARISON:  05/12/2011  FINDINGS: Scattered atherosclerotic calcifications aorta and coronary arteries.  Large hiatal hernia.  Small pericardial effusion.  Questionable wall thickening of thoracic esophagus versus artifact from underdistention.  No thoracic adenopathy.  BILATERAL renal cortical atrophy at their upper poles.  Dependent atelectasis in both lungs.  Atelectasis versus focal infiltrate in posterior aspect of RIGHT upper lobe superiorly, new.  Central peribronchial thickening.  Remaining lungs clear.  No pleural effusion or pneumothorax.  Bones diffusely demineralized with evidence of prior thoracic spinal fixation and scattered thoracic compression deformities.  IMPRESSION: Bronchitic changes with dependent atelectasis in both lower lobes.  Atelectasis versus infiltrate in posterior aspect of RIGHT upper lobe   Electronically Signed   By: Lavonia Dana M.D.   On: 10/30/2014 14:37   Dg Chest Port 1 View  10/29/2014   CLINICAL DATA:  Respiratory distress.  Cough.  EXAM: PORTABLE CHEST - 1 VIEW  COMPARISON:  01/23/2013  FINDINGS: The heart is enlarged. Large hiatal hernia is again noted. There is prominence of interstitial markings consistent with interstitial edema.  IMPRESSION: 1. Cardiomegaly and mild edema. 2. Large hiatal hernia.   Electronically Signed   By: Nolon Nations M.D.   On: 10/29/2014 10:51    Medications: I have reviewed the patient's current medications.  Assessment/Plan: 1. ? Hematemesis. No gross bleeding would not do EGD with active pneumonia unless active bleeding. Will advance diet and should be ok to give  protonix PO Will follow.   Terissa Haffey JR,Miasia Crabtree L 10/31/2014, 9:57 AM  Pager: [phone] If no answer or after hours call [phone]

## 2014-10-31 NOTE — Progress Notes (Signed)
Patient Demographics  [name], is a 81 y.o. female, DOB - 07-Dec-1929, IRC:[id number]  Admit date - 10/29/2014   Admitting Physician Robbie Lis, MD  Outpatient Primary MD for the patient is [name], MD  LOS - 2   Chief Complaint  Patient presents with  . Respiratory Distress      Admission history of present illness/brief narrative: 81 year old female with history of remote CVA, hypertension, diastolic CHF, dyslipidemia , presented  with worsening shortness of breath, cough, fever and chills and productive cough. S. No reports of blood in stool or urine. No reports of diarrhea or constipation. On EMS arrival she was given albuterol, Atrovent, solumedrol.  In ED, BP was 111/54, HR 110-149, RR 19-41, T max 101.47F and oxygen saturation 86% on room air. She required BiPAP to keep O2 sats above 90%.She was given nebulizers in ED, lasix 40 mg IV once, cefepime and vanco. She was admitted to SDU since she is on BiPAP.\ Patient respiratory status improved, currently tolerating nasal cannula, and 5/2 AM patient had an episode of coffee-ground emesis, as well as her troponins continues to increase, even though she still denies any chest pain. No further occurrence of coffee-ground emesis, tolerating clear liquid diet, troponins has plateaued and currently trending down.  Subjective:   [name] today has, No headache, No chest pain, No abdominal pain - No Nausea, No new weakness tingling or numbness, still complains of cough and shortness of breath , denies any chest pain , no further coffee-ground emesis . Assessment & Plan    Principal Problem:   Hypoxia Active Problems:   RA (rheumatoid arthritis)   Coronary atherosclerosis of native coronary artery   Essential hypertension   Sepsis   Elevated troponin  Acute respiratory failure with hypoxia /  Healthcare associated pneumonia - Pt presented with hypoxia on admission and oxygen saturation of 86% on room. Hypoxia likely from pneumonia.  - Initially on BiPAP, currently tolerating nasal cannula - CT chest showing evidence of right upper lobe pneumonia - Pt started on empiric cefepime and vanco 5/1 - Follow up blood culture results, no growth to date - legionella,  Influenza panel and strep pneumonia  are negative.  - Continue xopenex and atrovent nebs sch and as needed for shortness of breath or wheezing   Severe sepsis due to pneumonia / Leukocytosis  - Sepsis criteria met on admission with tachycardia, tachypnea, fever, hypoxia. In addition. Lactic acid was 8.7 and procalcitonin 0.21, patient has worsening leukocytosis, but this is most likely in the setting of saving IV steroids yesterday, will continue to monitor closely. - Follow up blood culture results; no growth to date - Continue empiric cefepime andvancomycin  Acute on chronic renal failure  - Dehydration and volume depletion, started on gentle hydration, total of 1 L given known history of CHF, check BMP in a.m.  Elevated troponin - Likely demand ischemia fro sepsis , hypoxia and pneumonia - troponiAntley trending down - No acute ischemic changes on 12 lead EKG - 2 D ECHEF 60-65% - held aspirin  given upper GI bleed - cardiology Input greatly appreciated   An episode of coffee-ground emesis this a.m. - This is most likely related to gastritis, esophagitis, given she  received IV steroids and full dose aspirin yesterday - No further episodes of coffee-ground emesis, and tinea with Protonix, advance diet today and monitor. - Gastroenterologconsult appreciated   Acute on chronic diastolic CHF - BNP on admission 1045. - She was given lasix 40 mg IV in ED - Monitor closely as on gentle hydration   Essential hypertension - Continue metoprolol   Dyslipidemia - Continue Crestor   Iron deficiency anemia anemia of  chronic kidney disease  - Continue ferrous sulfate supplementation  - Hemoglobin stable   Hypothyroidism - Continue synthroid    DVT prophylaxis:  - SCD's bilaterally   Code Status: Full  Family Communication: none at bedside  Disposition Plan:remains in stepdown   Procedures  None   Consults   Cardiology Gastroenterology   Medications  Scheduled Meds: . calcium-vitamin D  1 tablet Oral TID  . ceFEPime (MAXIPIME) IV  1 g Intravenous Q24H  . cholecalciferol  2,000 Units Oral Daily  . feeding supplement (RESOURCE BREEZE)  1 Container Oral Q24H  . ferrous sulfate  325 mg Oral Q breakfast  . ipratropium  2 spray Each Nare Q12H  . ipratropium  0.5 mg Nebulization Q6H  . levalbuterol  1.25 mg Nebulization 4 times per day  . levothyroxine  50 mcg Oral QAC breakfast  . loratadine  10 mg Oral Daily  . LORazepam  0.5 mg Oral QHS  . methocarbamol  500 mg Oral QHS  . metoprolol tartrate  25 mg Oral BID  . montelukast  10 mg Oral QHS  . multivitamin with minerals  1 tablet Oral Daily  . [START ON 11/02/2014] pantoprazole (PROTONIX) IV  40 mg Intravenous Q12H  . rosuvastatin  20 mg Oral QPM  . saccharomyces boulardii  250 mg Oral BID  . sertraline  50 mg Oral q morning - 10a  . vancomycin  1,000 mg Intravenous Q24H   Continuous Infusions: . sodium chloride 50 mL/hr at 10/31/14 0700  . pantoprozole (PROTONIX) infusion 8 mg/hr (10/31/14 0606)   PRN Meds:.acetaminophen, camphor-menthol, dextromethorphan-guaiFENesin, hydrALAZINE, ipratropium, levalbuterol, polyvinyl alcohol, promethazine  DVT Prophylaxis   SCDs   Lab Results  Component Value Date   PLT 112* 10/31/2014    Antibiotics   Anti-infectives    Start     Dose/Rate Route Frequency Ordered Stop   10/30/14 1200  ceFEPIme (MAXIPIME) 1 g in dextrose 5 % 50 mL IVPB     1 g 100 mL/hr over 30 Minutes Intravenous Every 24 hours 10/29/14 1835     10/30/14 1100  vancomycin (VANCOCIN) IVPB 1000 mg/200 mL premix   Status:  Discontinued     1,000 mg 200 mL/hr over 60 Minutes Intravenous Every 24 hours 10/29/14 1147 10/29/14 1458   10/30/14 1100  ceFEPIme (MAXIPIME) 1 g in dextrose 5 % 50 mL IVPB  Status:  Discontinued     1 g 100 mL/hr over 30 Minutes Intravenous Every 24 hours 10/29/14 1147 10/29/14 1458   10/30/14 1100  vancomycin (VANCOCIN) IVPB 1000 mg/200 mL premix     1,000 mg 200 mL/hr over 60 Minutes Intravenous Every 24 hours 10/29/14 1835     10/29/14 1300  ceFEPIme (MAXIPIME) 2 g in dextrose 5 % 50 mL IVPB  Status:  Discontinued     2 g 100 mL/hr over 30 Minutes Intravenous  Once 10/29/14 1258 10/29/14 1304   10/29/14 1300  vancomycin (VANCOCIN) IVPB 1000 mg/200 mL premix  Status:  Discontinued     1,000 mg 200 mL/hr over 60  Minutes Intravenous  Once 10/29/14 1258 10/29/14 1304   10/29/14 1045  ceFEPIme (MAXIPIME) 2 g in dextrose 5 % 50 mL IVPB     2 g 100 mL/hr over 30 Minutes Intravenous  Once 10/29/14 1032 10/29/14 1220   10/29/14 1045  vancomycin (VANCOCIN) IVPB 1000 mg/200 mL premix     1,000 mg 200 mL/hr over 60 Minutes Intravenous  Once 10/29/14 1032 10/29/14 1220          Objective:   Filed Vitals:   10/31/14 1010 10/31/14 1046 10/31/14 1200 10/31/14 1507  BP:  165/61 171/70   Pulse: 115  111   Temp:   100.1 F (37.8 C)   TempSrc:   Oral   Resp: 27  29   Height:      Weight:      SpO2: 96%  96% 99%    Wt Readings from Last 3 Encounters:  10/31/14 72.4 kg (159 lb 9.8 oz)  10/04/14 71.668 kg (158 lb)  09/05/14 71.668 kg (158 lb)     Intake/Output Summary (Last 24 hours) at 10/31/14 1604 Last data filed at 10/31/14 1400  Gross per 24 hour  Intake   1510 ml  Output      0 ml  Net   1510 ml     Physical Exam  Awake Alert, Oriented,in mild resp distress coughing, No new F.N deficits, Normal affect [place].AT,PERRAL Supple Neck,No JVD, No cervical lymphadenopathy appriciated.  Symmetrical Chest wall movement, bibasilar rales, no wheezing, minimal use of  accessory muscles. tachycardic,No Gallops,Rubs or new Murmurs, No Parasternal Heave +ve B.Sounds, Abd Soft, No tenderness, No organomegaly appriciated, No rebound - guarding or rigidity. No Cyanosis, Clubbing or edema, No new Rash or bruise    Data Review   Micro Results Recent Results (from the past 240 hour(s))  Culture, blood (x 2)     Status: None (Preliminary result)   Collection Time: 10/29/14  1:23 PM  Result Value Ref Range Status   Specimen Description BLOOD RIGHT ANTECUBITAL  Final   Special Requests BOTTLES DRAWN AEROBIC AND ANAEROBIC 5CC EACH  Final   Culture   Final           BLOOD CULTURE RECEIVED NO GROWTH TO DATE CULTURE WILL BE HELD FOR 5 DAYS BEFORE ISSUING A FINAL NEGATIVE REPORT Performed at [facility]    Report Status PENDING  Incomplete  Culture, blood (x 2)     Status: None (Preliminary result)   Collection Time: 10/29/14  1:40 PM  Result Value Ref Range Status   Specimen Description BLOOD RIGHT WRIST  Final   Special Requests BOTTLES DRAWN AEROBIC AND ANAEROBIC 5CC EACH  Final   Culture   Final           BLOOD CULTURE RECEIVED NO GROWTH TO DATE CULTURE WILL BE HELD FOR 5 DAYS BEFORE ISSUING A FINAL NEGATIVE REPORT Performed at [facility]    Report Status PENDING  Incomplete  MRSA PCR Screening     Status: None   Collection Time: 10/29/14  3:07 PM  Result Value Ref Range Status   MRSA by PCR NEGATIVE NEGATIVE Final    Comment:        The GeneXpert MRSA Assay (FDA approved for NASAL specimens only), is one component of a comprehensive MRSA colonization surveillance program. It is not intended to diagnose MRSA infection nor to guide or monitor treatment for MRSA infections.     Radiology Reports Ct Chest Wo Contrast  10/30/2014  CLINICAL DATA:  Nonproductive cough, fever, sore throat, history coronary disease post MI, GERD, stroke, hypertension, rheumatoid arthritis  EXAM: CT CHEST WITHOUT CONTRAST  TECHNIQUE: Multidetector CT  imaging of the chest was performed following the standard protocol without IV contrast. Sagittal and coronal MPR images reconstructed from axial data set. Images were repeated due to coughing.  COMPARISON:  05/12/2011  FINDINGS: Scattered atherosclerotic calcifications aorta and coronary arteries.  Large hiatal hernia.  Small pericardial effusion.  Questionable wall thickening of thoracic esophagus versus artifact from underdistention.  No thoracic adenopathy.  BILATERAL renal cortical atrophy at their upper poles.  Dependent atelectasis in both lungs.  Atelectasis versus focal infiltrate in posterior aspect of RIGHT upper lobe superiorly, new.  Central peribronchial thickening.  Remaining lungs clear.  No pleural effusion or pneumothorax.  Bones diffusely demineralized with evidence of prior thoracic spinal fixation and scattered thoracic compression deformities.  IMPRESSION: Bronchitic changes with dependent atelectasis in both lower lobes.  Atelectasis versus infiltrate in posterior aspect of RIGHT upper lobe   Electronically Signed   By: Lavonia Dana M.D.   On: 10/30/2014 14:37    CBC  Recent Labs Lab 10/29/14 1029 10/30/14 0414 10/30/14 1447 10/31/14 0500  WBC 14.1* 21.6*  --  16.5*  HGB 11.8* 11.4* 10.9* 10.5*  HCT 38.2 36.5 35.3* 34.6*  PLT 110* 117*  --  112*  MCV 90.5 88.4  --  90.8  MCH 28.0 27.6  --  27.6  MCHC 30.9 31.2  --  30.3  RDW 15.7* 15.5  --  16.0*  LYMPHSABS 1.6  --   --   --   MONOABS 1.0  --   --   --   EOSABS 0.1  --   --   --   BASOSABS 0.0  --   --   --     Chemistries   Recent Labs Lab 10/29/14 1029 10/29/14 1659 10/30/14 0414 10/31/14 0500  NA 138 137 138 140  K 4.4 4.1 4.0 4.2  CL 109 104 101 103  CO2 23 13* 26 28  GLUCOSE 133* 352* 175* 112*  BUN 17 21* 33* 49*  CREATININE 1.19* 1.80* 1.51* 1.83*  CALCIUM 8.5* 9.1 10.2 9.2  MG  --  1.9  --   --   AST 39 44* 41  --   ALT $Re'19 23 25  'OQR$ --   ALKPHOS 47 47 48  --   BILITOT 1.5* 0.8 0.9  --     ------------------------------------------------------------------------------------------------------------------ estimated creatinine clearance is 24.1 mL/min (by C-G formula based on Cr of 1.83). ------------------------------------------------------------------------------------------------------------------ No results for input(s): HGBA1C in the last 72 hours. ------------------------------------------------------------------------------------------------------------------ No results for input(s): CHOL, HDL, LDLCALC, TRIG, CHOLHDL, LDLDIRECT in the last 72 hours. ------------------------------------------------------------------------------------------------------------------ No results for input(s): TSH, T4TOTAL, T3FREE, THYROIDAB in the last 72 hours.  Invalid input(s): FREET3 ------------------------------------------------------------------------------------------------------------------ No results for input(s): VITAMINB12, FOLATE, FERRITIN, TIBC, IRON, RETICCTPCT in the last 72 hours.  Coagulation profile  Recent Labs Lab 10/29/14 1029  INR 1.02    No results for input(s): DDIMER in the last 72 hours.  Cardiac Enzymes  Recent Labs Lab 10/30/14 0655 10/31/14 0500 10/31/14 1056  TROPONINI 0.71* 0.56* 0.47*   ------------------------------------------------------------------------------------------------------------------ Invalid input(s): POCBNP     Time Spent in minutes   30  minutes   Marguerita Stapp M.D on 10/31/2014 at 4:04 PM  Between 7am to 7pm - Pager - [phone]  After 7pm go to [email or web address] - password [email or web address]  And look for the night coverage  person covering for me after hours  [facility] Office  [phone]   **Disclaimer: This note may have been dictated with voice recognition software. Similar sounding words can inadvertently be transcribed and this note may contain transcription errors which may not have been corrected upon  publication of note.**

## 2014-10-31 NOTE — Progress Notes (Signed)
CPT not done due to pt in chair. CPT currently being done through the bed.

## 2014-10-31 NOTE — Clinical Social Work Note (Signed)
Clinical Social Work Assessment  Patient Details  Name: [name] MRN: [id number] Date of Birth: January 21, 1930  Date of referral:  10/31/14               Reason for consult:  Discharge Planning                Permission sought to share information with:  Chartered certified accountant granted to share information::  Yes, Verbal Permission Granted  Name::     [name] [name]  Agency::  [name]  Relationship::  Facility and son  Contact Information:  [phone] [name]  Housing/Transportation Living arrangements for the past 2 months:  [place] of Information:  Patient Patient Interpreter Needed:  None Criminal Activity/Legal Involvement Pertinent to Current Situation/Hospitalization:  No - Comment as needed Significant Relationships:  Adult Children Lives with:  Facility Resident Do you feel safe going back to the place where you live?    Need for family participation in patient care:  No (Coment) (pt alert and oriented x 4)  Care giving concerns:  Pt admitted from [facility] [place] where pt is a long term resident   Facilities manager / plan:  CSW received referral that pt admitted from [facility].   CSW met with pt at bedside. No family present. CSW introduced self and explained role.   Pt confirmed that she is a resident at [facility]. Pt stated that she has been at facility for three years and is a long term resident.   CSW provided emotional support as pt discussed that ideally she does not want to live in a facility, but her son works and cannot provide the care she needs. Pt recognizes that [name] is best option given limited assistance at home.   Pt confirmed that she plans to return to [name] when medically ready for discharge.  CSW completed FL2 and sent clinicals to [facility]. CSW contacted [facility] and left message.  CSW to continue to follow to provide support and assist with  disposition needs.   Employment status:  Retired Forensic scientist:  Information systems manager, Medicaid In [place] PT Recommendations:  [place] / Referral to community resources:  [place]  Patient/Family's Response to care:  Pt alert and oriented x 4. Pt expressed disliking living in a facility as a whole, but recognizes that her son has to work and [facility] is the best place to meet her needs. Pt plans to return to [name] upon discharge from hospital.   Patient/Family's Understanding of and Emotional Response to Diagnosis, Current Treatment, and Prognosis:  Pt coping appropriately with current hospitalization and treatment. Pt recognizes that she needs continued treatment in hospital before returning to [facility].   Emotional Assessment Appearance:  Appears stated age, Well-Groomed Attitude/Demeanor/Rapport:  Other (pt attitude/demeanor appropriate) Affect (typically observed):  Guarded Orientation:  Oriented to Self, Oriented to Place, Oriented to  Time Alcohol / Substance use:  Not Applicable Psych involvement (Current and /or in the community):  No (Comment)  Discharge Needs  Concerns to be addressed:  Discharge Planning Concerns Readmission within the last 30 days:  No Current discharge risk:  Physical Impairment Barriers to Discharge:  Continued Medical Work up   Ladell Pier, [place] 10/31/2014, 11:59 AM  [phone]

## 2014-10-31 NOTE — Progress Notes (Signed)
Date:  Oct 31, 2014 Update in condition from chart: Chronic Diastolic HF grade I diastolic dysfunction RA (rheumatoid arthritis) Coronary atherosclerosis of native coronary artery with NSTEMI in 06/2010 with troponin 0.72 with apical LAD of 95% stenosis in small vessel.  All other CAD was non obstructive.  nuc later that year neg ischemia.  Was on ASA 81 mg. But stopped due to coffee ground emesis.   Essential hypertension- elevated here to labile 85/39 to 160/61 may drop after hydralazine   Sepsis on ABX lactic Acid 8.7 due to PNA followed by IM  Elevated troponin- 0.71, now decreasing to 0.56, may be demand ischemia with her CAD and hx on NSTEMI and neg nuc in 2012.  EF stable on echo Hyperlipidemia on crestor recent LDL in 06/2014 was 59, HDL 52 TG 252 and TC 161 CKD 3 with Cr 1.83  Small pericardial effusion on CT of chest -not seen on echo.    Will continue to follow for Case Management needs.

## 2014-10-31 NOTE — Progress Notes (Signed)
Pt tolerated CPT with bed fine. Rt will continue to monitor.

## 2014-10-31 NOTE — Progress Notes (Signed)
Subjective: "I feel better today"  No chest pain still with wheezes   Objective: Vital signs in last 24 hours: Temp:  [97.8 F (36.6 C)-100.7 F (38.2 C)] 100.7 F (38.2 C) (05/03 0800) Pulse Rate:  [98-131] 113 (05/03 0800) Resp:  [16-27] 27 (05/03 0800) BP: (85-203)/(39-136) 160/61 mmHg (05/03 0800) SpO2:  [92 %-99 %] 96 % (05/03 0800) Weight:  [159 lb 9.8 oz (72.4 kg)] 159 lb 9.8 oz (72.4 kg) (05/03 0500) Weight change: 1 lb 8.7 oz (0.7 kg) Last BM Date: 10/28/14 Intake/Output from previous day: +995 05/02 0701 - 05/03 0700 In: 995.4 [P.O.:360; I.V.:385.4; IV Piggyback:250] Out: -  Intake/Output this shift: Total I/O In: 200 [I.V.:200] Out: -   PE: General:Pleasant affect, NAD, but still with wheezes, mild fevers  Skin:Warm and dry, brisk capillary refill HEENT:normocephalic, sclera clear, mucus membranes moist Heart:S1S2 RRR without murmur, gallup, rub or click Lungs: without rales,  + rhonchi, + wheezes HAL:PFXT, non tender, + BS, do not palpate liver spleen or masses Ext:no lower ext edema, SCD stockings in place 2+ pedal pulses, 2+ radial pulses Neuro:alert and oriented X 3, MAE, follows commands, + facial symmetry Tele:  SR to ST    Lab Results:  Recent Labs  10/30/14 0414 10/30/14 1447 10/31/14 0500  WBC 21.6*  --  16.5*  HGB 11.4* 10.9* 10.5*  HCT 36.5 35.3* 34.6*  PLT 117*  --  112*   BMET  Recent Labs  10/30/14 0414 10/31/14 0500  NA 138 140  K 4.0 4.2  CL 101 103  CO2 26 28  GLUCOSE 175* 112*  BUN 33* 49*  CREATININE 1.51* 1.83*  CALCIUM 10.2 9.2    Recent Labs  10/30/14 0655 10/31/14 0500  TROPONINI 0.71* 0.56*    Lab Results  Component Value Date   CHOL 161 07/13/2014   HDL 52 07/13/2014   LDLCALC 59 07/13/2014   LDLDIRECT 60.7 11/13/2010   TRIG 252* 07/13/2014   CHOLHDL 2.9 12/02/2011   No results found for: HGBA1C   Lab Results  Component Value Date   TSH 1.03 07/06/2014    Hepatic Function  Panel  Recent Labs  10/30/14 0414  PROT 6.7  ALBUMIN 3.3*  AST 41  ALT 25  ALKPHOS 48  BILITOT 0.9   No results for input(s): CHOL in the last 72 hours. No results for input(s): PROTIME in the last 72 hours.     Studies/Results: Ct Chest Wo Contrast  10/30/2014   CLINICAL DATA:  Nonproductive cough, fever, sore throat, history coronary disease post MI, GERD, stroke, hypertension, rheumatoid arthritis  EXAM: CT CHEST WITHOUT CONTRAST  TECHNIQUE: Multidetector CT imaging of the chest was performed following the standard protocol without IV contrast. Sagittal and coronal MPR images reconstructed from axial data set. Images were repeated due to coughing.  COMPARISON:  05/12/2011  FINDINGS: Scattered atherosclerotic calcifications aorta and coronary arteries.  Large hiatal hernia.  Small pericardial effusion.  Questionable wall thickening of thoracic esophagus versus artifact from underdistention.  No thoracic adenopathy.  BILATERAL renal cortical atrophy at their upper poles.  Dependent atelectasis in both lungs.  Atelectasis versus focal infiltrate in posterior aspect of RIGHT upper lobe superiorly, new.  Central peribronchial thickening.  Remaining lungs clear.  No pleural effusion or pneumothorax.  Bones diffusely demineralized with evidence of prior thoracic spinal fixation and scattered thoracic compression deformities.  IMPRESSION: Bronchitic changes with dependent atelectasis in both lower lobes.  Atelectasis versus infiltrate in  posterior aspect of RIGHT upper lobe   Electronically Signed   By: Lavonia Dana M.D.   On: 10/30/2014 14:37   Dg Chest Port 1 View  10/29/2014   CLINICAL DATA:  Respiratory distress.  Cough.  EXAM: PORTABLE CHEST - 1 VIEW  COMPARISON:  01/23/2013  FINDINGS: The heart is enlarged. Large hiatal hernia is again noted. There is prominence of interstitial markings consistent with interstitial edema.  IMPRESSION: 1. Cardiomegaly and mild edema. 2. Large hiatal hernia.    Electronically Signed   By: Nolon Nations M.D.   On: 10/29/2014 10:51   ECHO: Study Conclusions - Left ventricle: The cavity size was normal. Wall thickness was increased in a pattern of mild LVH. Systolic function was normal. The estimated ejection fraction was in the range of 60% to 65%. There was dynamic obstruction at rest, with a peak velocity of 271 cm/sec and a peak gradient of 29 mm Hg. There was dynamic obstruction. Wall motion was normal; there were no regional wall motion abnormalities. Doppler parameters are consistent with abnormal left ventricular relaxation (grade 1 diastolic dysfunction). - Pulmonary arteries: PA peak pressure: 47 mm Hg (S).   Medications: I have reviewed the patient's current medications. Scheduled Meds: . calcium-vitamin D  1 tablet Oral TID  . ceFEPime (MAXIPIME) IV  1 g Intravenous Q24H  . cholecalciferol  2,000 Units Oral Daily  . feeding supplement (RESOURCE BREEZE)  1 Container Oral Q24H  . ferrous sulfate  325 mg Oral Q breakfast  . ipratropium  2 spray Each Nare Q12H  . ipratropium  0.5 mg Nebulization Q6H  . levalbuterol  1.25 mg Nebulization 4 times per day  . levothyroxine  50 mcg Oral QAC breakfast  . loratadine  10 mg Oral Daily  . LORazepam  0.5 mg Oral QHS  . methocarbamol  500 mg Oral QHS  . metoprolol tartrate  25 mg Oral BID  . montelukast  10 mg Oral QHS  . multivitamin with minerals  1 tablet Oral Daily  . [START ON 11/02/2014] pantoprazole (PROTONIX) IV  40 mg Intravenous Q12H  . rosuvastatin  20 mg Oral QPM  . saccharomyces boulardii  250 mg Oral BID  . sertraline  50 mg Oral q morning - 10a  . vancomycin  1,000 mg Intravenous Q24H   Continuous Infusions: . sodium chloride 50 mL/hr at 10/31/14 0700  . pantoprozole (PROTONIX) infusion 8 mg/hr (10/31/14 0606)   PRN Meds:.acetaminophen, camphor-menthol, dextromethorphan-guaiFENesin, hydrALAZINE, ipratropium, levalbuterol, polyvinyl alcohol,  promethazine  Assessment/Plan: Principal Problem:  Hypoxia- improved off of BiPAP, on nasal cannula, due to PNA with SP02 in the upper 90s.    Active Problems: Chronic Diastolic HF grade I diastolic dysfunction   RA (rheumatoid arthritis)   Coronary atherosclerosis of native coronary artery with NSTEMI in 06/2010 with troponin 0.72 with apical LAD of 95% stenosis in small vessel. All other CAD was non obstructive. nuc later that year neg ischemia. Was on ASA 81 mg. But stopped due to coffee ground emesis.    Essential hypertension- elevated here to labile 85/39 to 160/61 may drop after hydralazine    Sepsis on ABX lactic Acid 8.7 due to PNA followed by IM    Elevated troponin- 0.71, now decreasing to 0.56, may be demand ischemia with her CAD and hx on NSTEMI and neg nuc in 2012.  EF stable on echo  Hyperlipidemia on crestor recent LDL in 06/2014 was 59, HDL 52 TG 252 and TC 161   CKD 3 with  Cr 1.83   Small pericardial effusion on CT of chest -not seen on echo.    LOS: 2 days   Time spent with pt. :15 minutes. [facility] R  Nurse Practitioner Certified Pager [phone] or after 5pm and on weekends call [phone] 10/31/2014, 8:47 AM   History and all data above reviewed.  Patient examined.  I agree with the findings as above. She says that she feels better although she is obviously fatigued The patient exam reveals COR:RRR  ,  Lungs: Decreased breath sounds, coarse crackles  ,  Abd: Positive bowel sounds, no rebound no guarding, Ext No edema   .  All available labs, radiology testing, previous records reviewed. Agree with documented assessment and plan. Echo EF NL.  Mild enzyme elevation demand ischemia.  Possible stress perfusion study which would most likely be as an out patient. BP:  Labile.  Will consider adding standing therapy in the AM.   Ivor Kishi  1:16 PM  10/31/2014

## 2014-11-01 ENCOUNTER — Inpatient Hospital Stay (HOSPITAL_COMMUNITY): Payer: Medicare Other

## 2014-11-01 DIAGNOSIS — I2510 Atherosclerotic heart disease of native coronary artery without angina pectoris: Secondary | ICD-10-CM

## 2014-11-01 LAB — BASIC METABOLIC PANEL
Anion gap: 6 (ref 5–15)
BUN: 38 mg/dL — ABNORMAL HIGH (ref 6–20)
CHLORIDE: 107 mmol/L (ref 101–111)
CO2: 27 mmol/L (ref 22–32)
Calcium: 8.5 mg/dL — ABNORMAL LOW (ref 8.9–10.3)
Creatinine, Ser: 1.5 mg/dL — ABNORMAL HIGH (ref 0.44–1.00)
GFR calc Af Amer: 36 mL/min — ABNORMAL LOW (ref >60)
GFR calc non Af Amer: 31 mL/min — ABNORMAL LOW (ref >60)
GLUCOSE: 112 mg/dL — AB (ref 70–99)
POTASSIUM: 4.1 mmol/L (ref 3.5–5.1)
Sodium: 140 mmol/L (ref 135–145)

## 2014-11-01 LAB — VANCOMYCIN, TROUGH: VANCOMYCIN TR: 17 ug/mL (ref 10.0–20.0)

## 2014-11-01 LAB — CBC
HCT: 31.3 % — ABNORMAL LOW (ref 36.0–46.0)
HEMOGLOBIN: 9.8 g/dL — AB (ref 12.0–15.0)
MCH: 28.6 pg (ref 26.0–34.0)
MCHC: 31.3 g/dL (ref 30.0–36.0)
MCV: 91.3 fL (ref 78.0–100.0)
PLATELETS: 106 10*3/uL — AB (ref 150–400)
RBC: 3.43 MIL/uL — ABNORMAL LOW (ref 3.87–5.11)
RDW: 16.3 % — ABNORMAL HIGH (ref 11.5–15.5)
WBC: 9.7 10*3/uL (ref 4.0–10.5)

## 2014-11-01 LAB — GLUCOSE, CAPILLARY: GLUCOSE-CAPILLARY: 149 mg/dL — AB (ref 70–99)

## 2014-11-01 MED ORDER — FUROSEMIDE 10 MG/ML IJ SOLN
20.0000 mg | Freq: Once | INTRAMUSCULAR | Status: AC
  Administered 2014-11-01: 20 mg via INTRAVENOUS
  Filled 2014-11-01: qty 2

## 2014-11-01 MED ORDER — CETYLPYRIDINIUM CHLORIDE 0.05 % MT LIQD
7.0000 mL | Freq: Two times a day (BID) | OROMUCOSAL | Status: DC
  Administered 2014-11-02 – 2014-11-05 (×7): 7 mL via OROMUCOSAL

## 2014-11-01 MED ORDER — DOCUSATE SODIUM 100 MG PO CAPS
100.0000 mg | ORAL_CAPSULE | Freq: Every day | ORAL | Status: DC
  Administered 2014-11-02 – 2014-11-06 (×5): 100 mg via ORAL
  Filled 2014-11-01 (×5): qty 1

## 2014-11-01 MED ORDER — SODIUM CHLORIDE 0.9 % IV SOLN
INTRAVENOUS | Status: DC
  Administered 2014-11-01: 18:00:00 via INTRAVENOUS

## 2014-11-01 NOTE — Progress Notes (Signed)
Patient Demographics  [name], is a 81 y.o. female, DOB - [birth year], NAT:[id number]  Admit date - 10/29/2014   Admitting Physician Robbie Lis, MD  Outpatient Primary MD for the patient is [name], MD  LOS - 3   Chief Complaint  Patient presents with  . Respiratory Distress      Admission history of present illness/brief narrative: 81 year old female with history of remote CVA, hypertension, diastolic CHF, dyslipidemia , presented  with worsening shortness of breath, cough, fever and chills and productive cough. S. No reports of blood in stool or urine. No reports of diarrhea or constipation. On EMS arrival she was given albuterol, Atrovent, solumedrol.  In ED, BP was 111/54, HR 110-149, RR 19-41, T max 101.67F and oxygen saturation 86% on room air. She required BiPAP to keep O2 sats above 90%.She was given nebulizers in ED, lasix 40 mg IV once, cefepime and vanco. She was admitted to SDU since she is on BiPAP.\ Patient respiratory status improved, currently tolerating nasal cannula, and 5/2 AM patient had an episode of coffee-ground emesis, as well as her troponins continues to increase, even though she still denies any chest pain. No further occurrence of coffee-ground emesis, tolerating clear liquid diet, troponins has plateaued and currently trending down.  Subjective:   [name] today has, No headache, No chest pain, No abdominal pain - No Nausea, No new weakness tingling or numbness, still complains of cough and shortness of breath but overall feels better, denies any chest pain , no further coffee-ground emesis . Assessment & Plan    Acute respiratory failure with hypoxia / Healthcare associated pneumonia along with acute on chronic diastolic CHF - Pt presented with hypoxia on admission and oxygen saturation of 86% on room. Hypoxia likely from  pneumonia.  - Initially on BiPAP, currently tolerating nasal cannula - CT chest showing evidence of right upper lobe pneumonia - Pt started on empiric cefepime and vanco 5/1 - Follow up blood culture results, no growth to date - legionella,  Influenza panel and strep pneumonia  are negative.  - Continue xopenex and atrovent nebs sch and as needed for shortness of breath or wheezing  -Advance activity, initiate PT.  Severe sepsis due to pneumonia / Leukocytosis  - Sepsis criteria met on admission with tachycardia, tachypnea, fever, hypoxia. In addition. Lactic acid was 8.7 and procalcitonin 0.21, much improved with empiric antibiotics with negative cultures to date- Continue empiric cefepime and vancomycin   Acute on chronic renal failure stage III. Baseline creatinine close to 1.5 - Dehydration and volume depletion, IV fluids back to baseline  Elevated troponin - Troponin rise and non-ACS pattern secondary to demand ischemia from pneumonia and sepsis. - No acute ischemic changes on 12 lead EKG, completely chest pain-free - 2 D ECHEF 60-65% - held aspirin  given upper GI bleed, on beta blocker which will be continued along with statin - cardiology Input greatly appreciated   An episode of coffee-ground emesis this a.m. - This is most likely related to gastritis, esophagitis, given she received IV steroids and full dose aspirin yesterday - No further episodes of coffee-ground emesis, H&H stable, continue with IV Protonix, advance diet today and monitor. - Gastroenterologconsult appreciated   Acute  on chronic diastolic CHF EF 80% - BNP on admission 1045. - She was given lasix 40 mg IV in ED - CHF resolved  Essential hypertension - Continue metoprolol   Dyslipidemia - Continue Crestor   Iron deficiency anemia anemia of chronic kidney disease  - Continue ferrous sulfate supplementation  - Hemoglobin stable   Hypothyroidism - Continue synthroid    DVT prophylaxis:  -  SCD's bilaterally     Code Status: Full  Family Communication: none at bedside  Disposition Plan: remains in stepdown   Procedures  None   Consults   Cardiology Gastroenterology   Medications  Scheduled Meds: . calcium-vitamin D  1 tablet Oral TID  . ceFEPime (MAXIPIME) IV  1 g Intravenous Q24H  . cholecalciferol  2,000 Units Oral Daily  . feeding supplement (RESOURCE BREEZE)  1 Container Oral Q24H  . ferrous sulfate  325 mg Oral Q breakfast  . ipratropium  2 spray Each Nare Q12H  . ipratropium  0.5 mg Nebulization Q6H  . levalbuterol  1.25 mg Nebulization 4 times per day  . levothyroxine  50 mcg Oral QAC breakfast  . loratadine  10 mg Oral Daily  . LORazepam  0.5 mg Oral QHS  . methocarbamol  500 mg Oral QHS  . metoprolol tartrate  25 mg Oral BID  . montelukast  10 mg Oral QHS  . multivitamin with minerals  1 tablet Oral Daily  . [START ON 11/02/2014] pantoprazole (PROTONIX) IV  40 mg Intravenous Q12H  . rosuvastatin  20 mg Oral QPM  . saccharomyces boulardii  250 mg Oral BID  . sertraline  50 mg Oral q morning - 10a  . vancomycin  1,000 mg Intravenous Q24H   Continuous Infusions: . pantoprozole (PROTONIX) infusion 8 mg/hr (11/01/14 0435)   PRN Meds:.acetaminophen, camphor-menthol, dextromethorphan-guaiFENesin, hydrALAZINE, ipratropium, levalbuterol, polyvinyl alcohol, promethazine  DVT Prophylaxis   SCDs   Lab Results  Component Value Date   PLT 106* 11/01/2014    Antibiotics   Anti-infectives    Start     Dose/Rate Route Frequency Ordered Stop   10/30/14 1200  ceFEPIme (MAXIPIME) 1 g in dextrose 5 % 50 mL IVPB     1 g 100 mL/hr over 30 Minutes Intravenous Every 24 hours 10/29/14 1835     10/30/14 1100  vancomycin (VANCOCIN) IVPB 1000 mg/200 mL premix  Status:  Discontinued     1,000 mg 200 mL/hr over 60 Minutes Intravenous Every 24 hours 10/29/14 1147 10/29/14 1458   10/30/14 1100  ceFEPIme (MAXIPIME) 1 g in dextrose 5 % 50 mL IVPB  Status:   Discontinued     1 g 100 mL/hr over 30 Minutes Intravenous Every 24 hours 10/29/14 1147 10/29/14 1458   10/30/14 1100  vancomycin (VANCOCIN) IVPB 1000 mg/200 mL premix     1,000 mg 200 mL/hr over 60 Minutes Intravenous Every 24 hours 10/29/14 1835     10/29/14 1300  ceFEPIme (MAXIPIME) 2 g in dextrose 5 % 50 mL IVPB  Status:  Discontinued     2 g 100 mL/hr over 30 Minutes Intravenous  Once 10/29/14 1258 10/29/14 1304   10/29/14 1300  vancomycin (VANCOCIN) IVPB 1000 mg/200 mL premix  Status:  Discontinued     1,000 mg 200 mL/hr over 60 Minutes Intravenous  Once 10/29/14 1258 10/29/14 1304   10/29/14 1045  ceFEPIme (MAXIPIME) 2 g in dextrose 5 % 50 mL IVPB     2 g 100 mL/hr over 30 Minutes Intravenous  Once 10/29/14  1032 10/29/14 1220   10/29/14 1045  vancomycin (VANCOCIN) IVPB 1000 mg/200 mL premix     1,000 mg 200 mL/hr over 60 Minutes Intravenous  Once 10/29/14 1032 10/29/14 1220          Objective:   Filed Vitals:   11/01/14 0500 11/01/14 0600 11/01/14 0700 11/01/14 0831  BP:  115/52    Pulse: 98 98 98   Temp:    98.9 F (37.2 C)  TempSrc:    Oral  Resp: $Remo'25 22 23   'KLpAV$ Height:      Weight:      SpO2: 97% 96% 97% 96%    Wt Readings from Last 3 Encounters:  11/01/14 70.4 kg (155 lb 3.3 oz)  10/04/14 71.668 kg (158 lb)  09/05/14 71.668 kg (158 lb)     Intake/Output Summary (Last 24 hours) at 11/01/14 1009 Last data filed at 11/01/14 0200  Gross per 24 hour  Intake   1415 ml  Output      0 ml  Net   1415 ml     Physical Exam  Awake Alert, Oriented,in mild resp distress coughing, No new F.N deficits, Normal affect [place].AT,PERRAL Supple Neck,No JVD, No cervical lymphadenopathy appriciated.  Symmetrical Chest wall movement, bibasilar rales & coarse bilateral breath sounds, no wheezing,   tachycardic,No Gallops,Rubs or new Murmurs, No Parasternal Heave +ve B.Sounds, Abd Soft, No tenderness, No organomegaly appriciated, No rebound - guarding or rigidity. No Cyanosis,  Clubbing or edema, No new Rash or bruise    Data Review   Micro Results Recent Results (from the past 240 hour(s))  Culture, blood (x 2)     Status: None (Preliminary result)   Collection Time: 10/29/14  1:23 PM  Result Value Ref Range Status   Specimen Description BLOOD RIGHT ANTECUBITAL  Final   Special Requests BOTTLES DRAWN AEROBIC AND ANAEROBIC 5CC EACH  Final   Culture   Final           BLOOD CULTURE RECEIVED NO GROWTH TO DATE CULTURE WILL BE HELD FOR 5 DAYS BEFORE ISSUING A FINAL NEGATIVE REPORT Performed at [facility]    Report Status PENDING  Incomplete  Culture, blood (x 2)     Status: None (Preliminary result)   Collection Time: 10/29/14  1:40 PM  Result Value Ref Range Status   Specimen Description BLOOD RIGHT WRIST  Final   Special Requests BOTTLES DRAWN AEROBIC AND ANAEROBIC 5CC EACH  Final   Culture   Final           BLOOD CULTURE RECEIVED NO GROWTH TO DATE CULTURE WILL BE HELD FOR 5 DAYS BEFORE ISSUING A FINAL NEGATIVE REPORT Performed at [facility]    Report Status PENDING  Incomplete  MRSA PCR Screening     Status: None   Collection Time: 10/29/14  3:07 PM  Result Value Ref Range Status   MRSA by PCR NEGATIVE NEGATIVE Final    Comment:        The GeneXpert MRSA Assay (FDA approved for NASAL specimens only), is one component of a comprehensive MRSA colonization surveillance program. It is not intended to diagnose MRSA infection nor to guide or monitor treatment for MRSA infections.     Radiology Reports Ct Chest Wo Contrast  10/30/2014   CLINICAL DATA:  Nonproductive cough, fever, sore throat, history coronary disease post MI, GERD, stroke, hypertension, rheumatoid arthritis  EXAM: CT CHEST WITHOUT CONTRAST  TECHNIQUE: Multidetector CT imaging of the chest was performed following  the standard protocol without IV contrast. Sagittal and coronal MPR images reconstructed from axial data set. Images were repeated due to coughing.   COMPARISON:  05/12/2011  FINDINGS: Scattered atherosclerotic calcifications aorta and coronary arteries.  Large hiatal hernia.  Small pericardial effusion.  Questionable wall thickening of thoracic esophagus versus artifact from underdistention.  No thoracic adenopathy.  BILATERAL renal cortical atrophy at their upper poles.  Dependent atelectasis in both lungs.  Atelectasis versus focal infiltrate in posterior aspect of RIGHT upper lobe superiorly, new.  Central peribronchial thickening.  Remaining lungs clear.  No pleural effusion or pneumothorax.  Bones diffusely demineralized with evidence of prior thoracic spinal fixation and scattered thoracic compression deformities.  IMPRESSION: Bronchitic changes with dependent atelectasis in both lower lobes.  Atelectasis versus infiltrate in posterior aspect of RIGHT upper lobe   Electronically Signed   By: Lavonia Dana M.D.   On: 10/30/2014 14:37    CBC  Recent Labs Lab 10/29/14 1029 10/30/14 0414 10/30/14 1447 10/31/14 0500 11/01/14 0356  WBC 14.1* 21.6*  --  16.5* 9.7  HGB 11.8* 11.4* 10.9* 10.5* 9.8*  HCT 38.2 36.5 35.3* 34.6* 31.3*  PLT 110* 117*  --  112* 106*  MCV 90.5 88.4  --  90.8 91.3  MCH 28.0 27.6  --  27.6 28.6  MCHC 30.9 31.2  --  30.3 31.3  RDW 15.7* 15.5  --  16.0* 16.3*  LYMPHSABS 1.6  --   --   --   --   MONOABS 1.0  --   --   --   --   EOSABS 0.1  --   --   --   --   BASOSABS 0.0  --   --   --   --     Chemistries   Recent Labs Lab 10/29/14 1029 10/29/14 1659 10/30/14 0414 10/31/14 0500 11/01/14 0356  NA 138 137 138 140 140  K 4.4 4.1 4.0 4.2 4.1  CL 109 104 101 103 107  CO2 23 13* $Remo'26 28 27  'ebHAj$ GLUCOSE 133* 352* 175* 112* 112*  BUN 17 21* 33* 49* 38*  CREATININE 1.19* 1.80* 1.51* 1.83* 1.50*  CALCIUM 8.5* 9.1 10.2 9.2 8.5*  MG  --  1.9  --   --   --   AST 39 44* 41  --   --   ALT $Re'19 23 25  'dNw$ --   --   ALKPHOS 47 47 48  --   --   BILITOT 1.5* 0.8 0.9  --   --     ------------------------------------------------------------------------------------------------------------------ estimated creatinine clearance is 27.1 mL/min (by C-G formula based on Cr of 1.5). ------------------------------------------------------------------------------------------------------------------ No results for input(s): HGBA1C in the last 72 hours. ------------------------------------------------------------------------------------------------------------------ No results for input(s): CHOL, HDL, LDLCALC, TRIG, CHOLHDL, LDLDIRECT in the last 72 hours. ------------------------------------------------------------------------------------------------------------------ No results for input(s): TSH, T4TOTAL, T3FREE, THYROIDAB in the last 72 hours.  Invalid input(s): FREET3 ------------------------------------------------------------------------------------------------------------------ No results for input(s): VITAMINB12, FOLATE, FERRITIN, TIBC, IRON, RETICCTPCT in the last 72 hours.  Coagulation profile  Recent Labs Lab 10/29/14 1029  INR 1.02    No results for input(s): DDIMER in the last 72 hours.  Cardiac Enzymes  Recent Labs Lab 10/31/14 0500 10/31/14 1056 10/31/14 1714  TROPONINI 0.56* 0.47* 0.38*   ------------------------------------------------------------------------------------------------------------------ Invalid input(s): POCBNP     Time Spent in minutes   30  minutes   Lala Lund K M.D on 11/01/2014 at 10:09 AM  Between 7am to 7pm - Pager - [phone]  After 7pm go  to [email or web address] - password [email or web address]  And look for the night coverage person covering for me after hours  [facility] Office  [phone]   **Disclaimer: This note may have been dictated with voice recognition software. Similar sounding words can inadvertently be transcribed and this note may contain transcription errors which may not have been corrected upon  publication of note.**

## 2014-11-01 NOTE — Progress Notes (Signed)
Subjective: "tired of this stuff"  Continues with cough and wheezes no chest pain  Objective: Vital signs in last 24 hours: Temp:  [98.6 F (37 C)-100.1 F (37.8 C)] 98.9 F (37.2 C) (05/04 0831) Pulse Rate:  [93-115] 98 (05/04 0700) Resp:  [16-31] 23 (05/04 0700) BP: (113-171)/(42-70) 115/52 mmHg (05/04 0600) SpO2:  [86 %-99 %] 96 % (05/04 0831) Weight:  [155 lb 3.3 oz (70.4 kg)] 155 lb 3.3 oz (70.4 kg) (05/04 0436) Weight change: -4 lb 6.6 oz (-2 kg) Last BM Date: 10/28/14 Intake/Output from previous day: +1885 05/03 0701 - 05/04 0700 In: 1885 [P.O.:120; I.V.:1515; IV Piggyback:250] Out: -  Intake/Output this shift:    PE: General:Pleasant affect, ill in appearance Skin:Warm and dry, brisk capillary refill HEENT:normocephalic, sclera clear, mucus membranes moist Heart:S1S2 RRR without murmur, gallup, rub or click Lungs: without rales,+ rhonchi, insp exp wheezes QIO:NGEX, non tender, + BS, do not palpate liver spleen or masses Ext:no lower ext edema, SCD stockings in place,  2+ radial pulses Neuro:alert and oriented X 3, MAE, follows commands, + facial symmetry Tele:  ST to occ SR rate approx 105 occ PVCs   Lab Results:  Recent Labs  10/31/14 0500 11/01/14 0356  WBC 16.5* 9.7  HGB 10.5* 9.8*  HCT 34.6* 31.3*  PLT 112* 106*   BMET  Recent Labs  10/31/14 0500 11/01/14 0356  NA 140 140  K 4.2 4.1  CL 103 107  CO2 28 27  GLUCOSE 112* 112*  BUN 49* 38*  CREATININE 1.83* 1.50*  CALCIUM 9.2 8.5*    Recent Labs  10/31/14 1056 10/31/14 1714  TROPONINI 0.47* 0.38*    Lab Results  Component Value Date   CHOL 161 07/13/2014   HDL 52 07/13/2014   LDLCALC 59 07/13/2014   LDLDIRECT 60.7 11/13/2010   TRIG 252* 07/13/2014   CHOLHDL 2.9 12/02/2011   No results found for: HGBA1C   Lab Results  Component Value Date   TSH 1.03 07/06/2014    Hepatic Function Panel  Recent Labs  10/30/14 0414  PROT 6.7  ALBUMIN 3.3*  AST 41  ALT 25    ALKPHOS 48  BILITOT 0.9   No results for input(s): CHOL in the last 72 hours. No results for input(s): PROTIME in the last 72 hours.     Studies/Results: Ct Chest Wo Contrast  10/30/2014   CLINICAL DATA:  Nonproductive cough, fever, sore throat, history coronary disease post MI, GERD, stroke, hypertension, rheumatoid arthritis  EXAM: CT CHEST WITHOUT CONTRAST  TECHNIQUE: Multidetector CT imaging of the chest was performed following the standard protocol without IV contrast. Sagittal and coronal MPR images reconstructed from axial data set. Images were repeated due to coughing.  COMPARISON:  05/12/2011  FINDINGS: Scattered atherosclerotic calcifications aorta and coronary arteries.  Large hiatal hernia.  Small pericardial effusion.  Questionable wall thickening of thoracic esophagus versus artifact from underdistention.  No thoracic adenopathy.  BILATERAL renal cortical atrophy at their upper poles.  Dependent atelectasis in both lungs.  Atelectasis versus focal infiltrate in posterior aspect of RIGHT upper lobe superiorly, new.  Central peribronchial thickening.  Remaining lungs clear.  No pleural effusion or pneumothorax.  Bones diffusely demineralized with evidence of prior thoracic spinal fixation and scattered thoracic compression deformities.  IMPRESSION: Bronchitic changes with dependent atelectasis in both lower lobes.  Atelectasis versus infiltrate in posterior aspect of RIGHT upper lobe   Electronically Signed   By: Crist Infante.D.  On: 10/30/2014 14:37    Medications: I have reviewed the patient's current medications. Scheduled Meds: . calcium-vitamin D  1 tablet Oral TID  . ceFEPime (MAXIPIME) IV  1 g Intravenous Q24H  . cholecalciferol  2,000 Units Oral Daily  . feeding supplement (RESOURCE BREEZE)  1 Container Oral Q24H  . ferrous sulfate  325 mg Oral Q breakfast  . ipratropium  2 spray Each Nare Q12H  . ipratropium  0.5 mg Nebulization Q6H  . levalbuterol  1.25 mg Nebulization  4 times per day  . levothyroxine  50 mcg Oral QAC breakfast  . loratadine  10 mg Oral Daily  . LORazepam  0.5 mg Oral QHS  . methocarbamol  500 mg Oral QHS  . metoprolol tartrate  25 mg Oral BID  . montelukast  10 mg Oral QHS  . multivitamin with minerals  1 tablet Oral Daily  . [START ON 11/02/2014] pantoprazole (PROTONIX) IV  40 mg Intravenous Q12H  . rosuvastatin  20 mg Oral QPM  . saccharomyces boulardii  250 mg Oral BID  . sertraline  50 mg Oral q morning - 10a  . vancomycin  1,000 mg Intravenous Q24H   Continuous Infusions: . pantoprozole (PROTONIX) infusion 8 mg/hr (11/01/14 0435)   PRN Meds:.acetaminophen, camphor-menthol, dextromethorphan-guaiFENesin, hydrALAZINE, ipratropium, levalbuterol, polyvinyl alcohol, promethazine  Assessment/Plan:  81 year old female with past medical history of remote CVA, hypertension, NSTEMI 9449 diastolic CHF, dyslipidemia, RA who presented from [facility] with worsening shortness of breath, cough, fever and chills for past few days piror to this admission. Shortness of breath is present at rest and it is worse with movement. Pt reported dry and on occasion productive cough. She has associated chest discomfort with coughing. No palpitations. No reports of lightheadedness, falls.   Has CAD with NSTEMI included cath 2012 and LAD with 95% tubular lesion that was small and mLAD with ruptured plaque with 30-40% stenosis Along with diagonal and RCA non obstructed stenosis. She was treated medically. Last nuc 05/2011 with no evidence of ischemia fixed defect involving the inf. Wall and likely representing diaphragmatic attenuation artifact. EF 79%.  Principal Problem:  Hypoxia- improved off of BiPAP, on nasal cannula, due to PNA with SP02 in the upper 90s.   Active Problems: Chronic Diastolic HF grade I diastolic dysfunction- +6759 ( since admit +3096) wt down 4 lbs   RA (rheumatoid arthritis)- per IM   Coronary atherosclerosis of native coronary  artery with NSTEMI in 06/2010 with troponin 0.72 with apical LAD of 95% stenosis in small vessel. All other CAD was non obstructive. nuc later that year neg ischemia. Was on ASA 81 mg. But stopped due to coffee ground emesis. Plan nuc study as outpt.    Essential hypertension- improved 145/57 to 115/52 not so labile, on lopressor 25 BID and prn hydralazine.    Sepsis on ABX lactic Acid 8.7 due to PNA followed by IM    Elevated troponin- 0.71, now decreasing to 0.56-->0.47-->0.38, may be demand ischemia with her CAD and hx on NSTEMI and neg nuc in 2012. EF stable on echo- plan out pt nuc.  Hyperlipidemia on crestor recent LDL in 06/2014 was 59, HDL 52 TG 252 and TC 161 stable   CKD 3 with Cr 1.83 -->1.50  Small pericardial effusion on CT of chest -not seen on echo.   No BM per pt add stool softner   LOS: 3 days   Time spent with pt. :15 minutes. [facility] R  Nurse Practitioner Certified Pager  [phone] or after 5pm and on weekends call [phone] 11/01/2014, 10:05 AM   History and all data above reviewed. She is fatigues.  Symptomatic improvement has been slow.  She is exasperated.  However, her oxygen sats are OK.   Patient examined.  I agree with the findings as above.  The patient exam reveals COR:RRR  ,  Lungs: Decreased breath sounds with continued wheezing  ,  Abd: Positive bowel sounds, no rebound no guarding, Ext No edema  .  All available labs, radiology testing, previous records reviewed. Agree with documented assessment and plan. Elevated Troponin:  Demand ischemia.  No further in patient work up suggested.  Please call us with further questions.    Jeneen Rinks Davontay Watlington  11:43 AM  11/01/2014

## 2014-11-01 NOTE — Progress Notes (Signed)
[facility] PROGRESS NOTE Subjective patient still complaining of shortness of breath but no further hematemesis or abdominal pain.  Objective: Vital signs in last 24 hours: Temp:  [98.5 F (36.9 C)-99.8 F (37.7 C)] 98.5 F (36.9 C) (05/04 1200) Pulse Rate:  [92-109] 94 (05/04 1400) Resp:  [16-31] 23 (05/04 1400) BP: (93-145)/(42-63) 118/44 mmHg (05/04 1400) SpO2:  [86 %-100 %] 99 % (05/04 1451) Weight:  [70.4 kg (155 lb 3.3 oz)] 70.4 kg (155 lb 3.3 oz) (05/04 0436) Last BM Date: 10/28/14  Intake/Output from previous day: 05/03 0701 - 05/04 0700 In: 1885 [P.O.:120; I.V.:1515; IV Piggyback:250] Out: -  Intake/Output this shift: Total I/O In: 750.4 [P.O.:240; I.V.:260.4; IV Piggyback:250] Out: -   PE: General--short of breath with talking  Lungs--still scattered wheezing Abdomen-- nontender  Lab Results:  Recent Labs  10/30/14 0414 10/30/14 1447 10/31/14 0500 11/01/14 0356  WBC 21.6*  --  16.5* 9.7  HGB 11.4* 10.9* 10.5* 9.8*  HCT 36.5 35.3* 34.6* 31.3*  PLT 117*  --  112* 106*   BMET  Recent Labs  10/29/14 1659 10/30/14 0414 10/31/14 0500 11/01/14 0356  NA 137 138 140 140  K 4.1 4.0 4.2 4.1  CL 104 101 103 107  CO2 13* 26 28 27   CREATININE 1.80* 1.51* 1.83* 1.50*   LFT  Recent Labs  10/29/14 1659 10/30/14 0414  PROT 6.3* 6.7  AST 44* 41  ALT 23 25  ALKPHOS 47 48  BILITOT 0.8 0.9   PT/INR No results for input(s): LABPROT, INR in the last 72 hours. PANCREAS No results for input(s): LIPASE in the last 72 hours.       Studies/Results: Dg Chest Port 1 View  11/01/2014   CLINICAL DATA:  Shortness of breath, wheezing, congestion. Community acquired pneumonia.  EXAM: PORTABLE CHEST - 1 VIEW  COMPARISON:  10/29/2014  FINDINGS: There is a large hiatal hernia. Very low lung volumes with bibasilar opacities, worsening since prior study. Increasing right upper lobe airspace opacity. No Conference that no effusions or acute bony  abnormality. Posterior spinal rods in place.  IMPRESSION: Low lung volumes, worsening since prior study. Increasing right upper lobe and bibasilar airspace opacities. Findings concerning for multifocal pneumonia.   Electronically Signed   By: Rolm Baptise M.D.   On: 11/01/2014 12:16    Medications: I have reviewed the patient's current medications.  Assessment/Plan: 1. Hematemesis. This was fairly minimal the patient is on adequate therapy. Given her severe lung infection would not recommend EGD and less actively bleeding. Would continue her on PPI therapy. We will sign off but please call us back for any further problems.    [place] JR,Teagan Ozawa L 11/01/2014, 3:46 PM  Pager: [phone] If no answer or after hours call [phone]

## 2014-11-01 NOTE — Progress Notes (Signed)
Pt sleeping RN ask rt hold  CPT with bed.

## 2014-11-01 NOTE — Progress Notes (Addendum)
ANTIBIOTIC CONSULT NOTE -FOLLOW-UP  Pharmacy Consult for Vancomycin & Cefepime Indication: pneumonia  Allergies  Allergen Reactions  . Biphosphate   . Codeine     sick  . Morphine And Related Other (See Comments)    unknown  . Percocet [Oxycodone-Acetaminophen]     unknown  . Plavix [Clopidogrel Bisulfate] Other (See Comments)    unknown  . Sulfur Other (See Comments)    unknown    Patient Measurements: Height: 5\' 6"  (167.6 cm) Weight: 155 lb 3.3 oz (70.4 kg) IBW/kg (Calculated) : 59.3  Vital Signs: Temp: 98.5 F (36.9 C) (05/04 1200) Temp Source: Oral (05/04 1200) BP: 118/44 mmHg (05/04 1400) Pulse Rate: 94 (05/04 1400) Intake/Output from previous day: 05/03 0701 - 05/04 0700 In: 1885 [P.O.:120; I.V.:1515; IV Piggyback:250] Out: -  Intake/Output from this shift: Total I/O In: 750.4 [P.O.:240; I.V.:260.4; IV Piggyback:250] Out: -   Labs:  Recent Labs  10/30/14 0414 10/30/14 1447 10/31/14 0500 11/01/14 0356  WBC 21.6*  --  16.5* 9.7  HGB 11.4* 10.9* 10.5* 9.8*  PLT 117*  --  112* 106*  CREATININE 1.51*  --  1.83* 1.50*   Estimated Creatinine Clearance: 27.1 mL/min (by C-G formula based on Cr of 1.5).  Recent Labs  11/01/14 1040  [place]     Microbiology: Recent Results (from the past 720 hour(s))  Culture, blood (x 2)     Status: None (Preliminary result)   Collection Time: 10/29/14  1:23 PM  Result Value Ref Range Status   Specimen Description BLOOD RIGHT ANTECUBITAL  Final   Special Requests BOTTLES DRAWN AEROBIC AND ANAEROBIC 5CC EACH  Final   Culture   Final           BLOOD CULTURE RECEIVED NO GROWTH TO DATE CULTURE WILL BE HELD FOR 5 DAYS BEFORE ISSUING A FINAL NEGATIVE REPORT Performed at [facility]    Report Status PENDING  Incomplete  Culture, blood (x 2)     Status: None (Preliminary result)   Collection Time: 10/29/14  1:40 PM  Result Value Ref Range Status   Specimen Description BLOOD RIGHT WRIST  Final   Special  Requests BOTTLES DRAWN AEROBIC AND ANAEROBIC 5CC EACH  Final   Culture   Final           BLOOD CULTURE RECEIVED NO GROWTH TO DATE CULTURE WILL BE HELD FOR 5 DAYS BEFORE ISSUING A FINAL NEGATIVE REPORT Performed at [facility]    Report Status PENDING  Incomplete  MRSA PCR Screening     Status: None   Collection Time: 10/29/14  3:07 PM  Result Value Ref Range Status   MRSA by PCR NEGATIVE NEGATIVE Final    Comment:        The GeneXpert MRSA Assay (FDA approved for NASAL specimens only), is one component of a comprehensive MRSA colonization surveillance program. It is not intended to diagnose MRSA infection nor to guide or monitor treatment for MRSA infections.     Medical History: Past Medical History  Diagnosis Date  . SOB (shortness of breath)   . MI (myocardial infarction)   . Poor appetite   . Arthritis   . Fatigue   . Right ear pain   . Renal insufficiency   . RA (rheumatoid arthritis)   . PUD (peptic ulcer disease)   . GI bleed 2005  . GERD (gastroesophageal reflux disease)   . Hiatal hernia   . Hypothyroidism   . Osteoporosis   . Depression   .  Anxiety   . Coronary artery disease   . CVA (cerebral vascular accident)   . TIA (transient ischemic attack)   . Fall   . DVT of lower extremity (deep venous thrombosis)   . HCAP (healthcare-associated pneumonia) 01/22/2013  . Enteritis due to Clostridium difficile 01/04/2013  . Sepsis 12/19/2012  . Recurrent colitis due to Clostridium difficile 06/02/2012  . Hypertension   . Hyperlipidemia   . Fatigue   . Weight loss   . Hemorrhoids   . Malnutrition   . Dyspnea     Assessment: 81 y/o female presents from [facility] with worsening respiratory distress, cough, fever, and chills. Pharmacy consulted to dose Vancomycin and Cefepime for HCAP.   5/1 >> Vancomycin >> 5/1 >> Cefepime >>    5/1 blood x 2: NGTD 5/1 MRSA PCR: negative  Goal of Therapy:  Vancomycin trough level 15-20 mcg/ml  Appropriate  antibiotic dosing for renal function and indication Eradication of infection  Plan:   Continue Vancomycin 1g IV q24h, as trough level at goal.  Continue Cefepime 1gm IV q24h.  Monitor renal function, cultures, clinical course.   Lindell Spar, PharmD, BCPS Pager: [phone] 11/01/2014 3:20 PM

## 2014-11-01 NOTE — Progress Notes (Signed)
Very poor venous access, multiple IV sticks and restarts since admission. Pt is on vanc and would benefit from a PICC line. Floor RN Chrys Racer made aware.

## 2014-11-02 ENCOUNTER — Inpatient Hospital Stay (HOSPITAL_COMMUNITY): Payer: Medicare Other

## 2014-11-02 DIAGNOSIS — M069 Rheumatoid arthritis, unspecified: Secondary | ICD-10-CM

## 2014-11-02 LAB — HEMOGLOBIN AND HEMATOCRIT, BLOOD
HCT: 29.5 % — ABNORMAL LOW (ref 36.0–46.0)
Hemoglobin: 9.5 g/dL — ABNORMAL LOW (ref 12.0–15.0)

## 2014-11-02 LAB — CBC
HCT: 31.5 % — ABNORMAL LOW (ref 36.0–46.0)
Hemoglobin: 10.1 g/dL — ABNORMAL LOW (ref 12.0–15.0)
MCH: 28.6 pg (ref 26.0–34.0)
MCHC: 32.1 g/dL (ref 30.0–36.0)
MCV: 89.2 fL (ref 78.0–100.0)
Platelets: 107 10*3/uL — ABNORMAL LOW (ref 150–400)
RBC: 3.53 MIL/uL — AB (ref 3.87–5.11)
RDW: 15.9 % — ABNORMAL HIGH (ref 11.5–15.5)
WBC: 12 10*3/uL — ABNORMAL HIGH (ref 4.0–10.5)

## 2014-11-02 LAB — GLUCOSE, CAPILLARY: Glucose-Capillary: 93 mg/dL (ref 70–99)

## 2014-11-02 LAB — BASIC METABOLIC PANEL
Anion gap: 7 (ref 5–15)
BUN: 32 mg/dL — AB (ref 6–20)
CO2: 24 mmol/L (ref 22–32)
Calcium: 8 mg/dL — ABNORMAL LOW (ref 8.9–10.3)
Chloride: 109 mmol/L (ref 101–111)
Creatinine, Ser: 1.54 mg/dL — ABNORMAL HIGH (ref 0.44–1.00)
GFR calc Af Amer: 35 mL/min — ABNORMAL LOW (ref >60)
GFR, EST NON AFRICAN AMERICAN: 30 mL/min — AB (ref >60)
GLUCOSE: 89 mg/dL (ref 70–99)
POTASSIUM: 3.4 mmol/L — AB (ref 3.5–5.1)
Sodium: 140 mmol/L (ref 135–145)

## 2014-11-02 LAB — MAGNESIUM: Magnesium: 1.7 mg/dL (ref 1.7–2.4)

## 2014-11-02 MED ORDER — FUROSEMIDE 10 MG/ML IJ SOLN
20.0000 mg | Freq: Once | INTRAMUSCULAR | Status: AC
  Administered 2014-11-02: 20 mg via INTRAVENOUS
  Filled 2014-11-02: qty 2

## 2014-11-02 MED ORDER — LEVALBUTEROL HCL 1.25 MG/0.5ML IN NEBU
2.5000 mg | INHALATION_SOLUTION | Freq: Four times a day (QID) | RESPIRATORY_TRACT | Status: DC | PRN
  Filled 2014-11-02: qty 1

## 2014-11-02 MED ORDER — ALUM & MAG HYDROXIDE-SIMETH 200-200-20 MG/5ML PO SUSP
30.0000 mL | Freq: Once | ORAL | Status: AC
  Administered 2014-11-02: 30 mL via ORAL
  Filled 2014-11-02: qty 30

## 2014-11-02 MED ORDER — PROMETHAZINE HCL 25 MG/ML IJ SOLN
12.5000 mg | Freq: Four times a day (QID) | INTRAMUSCULAR | Status: DC | PRN
  Administered 2014-11-02 – 2014-11-05 (×5): 12.5 mg via INTRAVENOUS
  Filled 2014-11-02 (×5): qty 1

## 2014-11-02 MED ORDER — PANTOPRAZOLE SODIUM 40 MG PO TBEC
40.0000 mg | DELAYED_RELEASE_TABLET | Freq: Two times a day (BID) | ORAL | Status: DC
  Administered 2014-11-02 – 2014-11-06 (×9): 40 mg via ORAL
  Filled 2014-11-02 (×9): qty 1

## 2014-11-02 MED ORDER — POTASSIUM CHLORIDE 10 MEQ/100ML IV SOLN
10.0000 meq | INTRAVENOUS | Status: AC
  Administered 2014-11-02 (×2): 10 meq via INTRAVENOUS
  Filled 2014-11-02 (×2): qty 100

## 2014-11-02 MED ORDER — IPRATROPIUM-ALBUTEROL 0.5-2.5 (3) MG/3ML IN SOLN
3.0000 mL | Freq: Four times a day (QID) | RESPIRATORY_TRACT | Status: DC
  Administered 2014-11-02 – 2014-11-05 (×12): 3 mL via RESPIRATORY_TRACT
  Filled 2014-11-02 (×12): qty 3

## 2014-11-02 MED ORDER — ONDANSETRON HCL 4 MG/2ML IJ SOLN
4.0000 mg | Freq: Four times a day (QID) | INTRAMUSCULAR | Status: DC | PRN
  Administered 2014-11-02 – 2014-11-04 (×2): 4 mg via INTRAVENOUS
  Filled 2014-11-02 (×4): qty 2

## 2014-11-02 MED ORDER — MAGNESIUM SULFATE IN D5W 10-5 MG/ML-% IV SOLN
1.0000 g | Freq: Once | INTRAVENOUS | Status: AC
  Administered 2014-11-02: 1 g via INTRAVENOUS
  Filled 2014-11-02 (×2): qty 100

## 2014-11-02 NOTE — Evaluation (Signed)
Physical Therapy Evaluation Patient Details Name: [name] MRN: [id number] DOB: 1930-04-30 Today's Date: 11/02/2014   History of Present Illness  81 yo female admitted with hypoxia, pna, acute resp failure. Hx of MI, RA, osteoporosis, CVA, falls, DVT, HTN.   Clinical Impression  On eval pt required Mod assist +2 for mobility-only able to stand for ~10 seconds before needing to sit. Pt is weak and deconditioned. Recommend return to SNF. Pt could possibly benefit from therapy services at SNF once she returns.     Follow Up Recommendations SNF (could benefit from participating at rehab at SNF-pt is longterm)    Equipment Recommendations  None recommended by PT    Recommendations for Other Services       Precautions / Restrictions Precautions Precautions: Fall Restrictions Weight Bearing Restrictions: No      Mobility  Bed Mobility               General bed mobility comments: pt oob in recliner  Transfers Overall transfer level: Needs assistance Equipment used: Rolling walker (2 wheeled) Transfers: Sit to/from Stand Sit to Stand: Mod assist;+2 physical assistance;+2 safety/equipment         General transfer comment: Assisted to rise, stabilize, control descent. Multimodal cues for safety, technique, hand placement. Pt only able to tolerate standing for ~10 seconds before needing to sit.   Ambulation/Gait             General Gait Details: NT-pt unable  Stairs            Wheelchair Mobility    Modified Rankin (Stroke Patients Only)       Balance Overall balance assessment: Needs assistance         Standing balance support: Bilateral upper extremity supported;During functional activity Standing balance-Leahy Scale: Poor                               Pertinent Vitals/Pain Pain Assessment: Faces Faces Pain Scale: Hurts even more Pain Location: abdomen Pain Descriptors / Indicators: Sore Pain Intervention(s): Limited  activity within patient's tolerance    Home Living Family/patient expects to be discharged to:: Skilled nursing facility                 Additional Comments: per pt, pt used walker. not participating with therapies.     Prior Function Level of Independence: Needs assistance               Hand Dominance        Extremity/Trunk Assessment   Upper Extremity Assessment: Generalized weakness           Lower Extremity Assessment: Generalized weakness      Cervical / Trunk Assessment: Kyphotic  Communication   Communication: No difficulties  Cognition Arousal/Alertness: Awake/alert Behavior During Therapy: WFL for tasks assessed/performed Overall Cognitive Status: Within Functional Limits for tasks assessed                      General Comments      Exercises        Assessment/Plan    PT Assessment Patient needs continued PT services  PT Diagnosis Difficulty walking;Generalized weakness;Acute pain   PT Problem List Decreased strength;Decreased activity tolerance;Decreased balance;Decreased mobility;Decreased knowledge of use of DME;Pain  PT Treatment Interventions DME instruction;Gait training;Functional mobility training;Therapeutic activities;Therapeutic exercise;Patient/family education;Balance training   PT Goals (Current goals can be found in the Care Plan section) Acute Rehab PT  Goals Patient Stated Goal: to feel better PT Goal Formulation: With patient Time For Goal Achievement: 11/16/14 Potential to Achieve Goals: Fair    Frequency Min 2X/week   Barriers to discharge        Co-evaluation               End of Session Equipment Utilized During Treatment: Gait belt;Oxygen Activity Tolerance: Patient limited by fatigue Patient left: in chair;with call bell/phone within reach           Time: 1132-1141 PT Time Calculation (min) (ACUTE ONLY): 9 min   Charges:   PT Evaluation $Initial PT Evaluation Tier I: 1 Procedure      PT G Codes:        Weston Anna, MPT Pager: [phone]

## 2014-11-02 NOTE — Consult Note (Signed)
Name: [name] MRN: [id number] DOB: [birth year]    ADMISSION DATE:  10/29/2014 CONSULTATION DATE:  11/02/14  REFERRING MD :  Dr. Candiss Norse   CHIEF COMPLAINT:  Acute Respiratory Failure   BRIEF PATIENT DESCRIPTION: [name] with complex medical hx (see below) admitted 5/1 with acute hypoxic respiratory failure in the setting of infiltrates, RUL PNA.  PCCM consulted for evaluation.    PMH: RA, GERD, Hiatal hernia, hypothyroidism, CAD, CVA, DVT, C-Diff, sepsis, HTN, HLD, malnutrition, osteoarthritis, renal insufficiency   SIGNIFICANT EVENTS  5/01  Admit with acute hypoxic respiratory failure in setting of infiltrates, RUL PNA 5/05  PCCM consulted for pulmonary evaluation    STUDIES:  5/02  CT Chest >> bronchitic changes with dependent atelectasis bilaterally, atx vs infiltrate in RUL   HISTORY OF PRESENT ILLNESS:  [name] with a PMH of RA (prednisone 10 mg QD), GERD, Hiatal hernia, hypothyroidism, CAD, CVA, DVT, C-Diff, sepsis, HTN, HLD, malnutrition, osteoarthritis, & renal insufficiency who presented to [facility] on 5/1 via EMS from [facility]) with reports of 3-4 days of cough and increasing shortness of breath.  On EMS arrival, she was noted to be hypoxic with saturations in the 80's with severe respiratory distress.  Initially with EMS she indicated she would not want intubation but then with EDP indicated she "wanted to think about it".  Initial evaluation included a CXR which showed atelectasis.  She was placed on BiPAP for increased WOB with improvement.  ABG was within normal limits.  Lactic acid rose to 8.7.  The patient was admitted per [facility] for possible HCAP.  She was further evaluated with a CT of the chest which showed bronchitic changes with dependent atelectasis bilaterally, atx vs infiltrate in RUL.  The patient was treated for HCAP and admitted to SDU.  Troponin was elevated in the setting of demand ischemia, Cardiology was consulted.  She made improvement in O2  needs and required no further bipap after initial treatment.  PCCM consulted for evaluation on 5/5.  Patient reports feeling like she had "a bug" before coming into the hospital.  Denies sick contacts.  She reports experiencing occasional food hanging up and difficulty swallowing but does not remember a specific event in the last week.  She did have transient nausea and vomiting on admission which resolved with zofran and she has not had further.  She was a never smoker, worked as a Scientist, water quality at [employer]  She lives at an ALF and walks with a walker.  Currently reports cough with minimal sputum production.   PAST MEDICAL HISTORY :   has a past medical history of SOB (shortness of breath); MI (myocardial infarction); Poor appetite; Arthritis; Fatigue; Right ear pain; Renal insufficiency; RA (rheumatoid arthritis); PUD (peptic ulcer disease); GI bleed (2005); GERD (gastroesophageal reflux disease); Hiatal hernia; Hypothyroidism; Osteoporosis; Depression; Anxiety; Coronary artery disease; CVA (cerebral vascular accident); TIA (transient ischemic attack); Fall; DVT of lower extremity (deep venous thrombosis); HCAP (healthcare-associated pneumonia) (01/22/2013); Enteritis due to Clostridium difficile (01/04/2013); Sepsis (12/19/2012); Recurrent colitis due to Clostridium difficile (06/02/2012); Hypertension; Hyperlipidemia; Fatigue; Weight loss; Hemorrhoids; Malnutrition; and Dyspnea.  has past surgical history that includes NSTEMI (06/2010); SPINAL FUSION SURGERY; Knee surgery; Cardiac catheterization; Back surgery; Breast surgery (1964); Ankle surgery; and Colonoscopy.   Prior to Admission medications   Medication Sig Start Date End Date Taking? Authorizing Provider  albuterol (PROVENTIL HFA;VENTOLIN HFA) 108 (90 BASE) MCG/ACT inhaler Inhale 1 puff into the lungs every 6 (six) hours as  needed for wheezing or shortness of breath.   Yes Historical Provider, MD  aspirin 81 MG chewable tablet Chew 81 mg by mouth daily.    Yes Historical Provider, MD  calcium-vitamin D (OSCAL WITH D) 500-200 MG-UNIT per tablet Take 1 tablet by mouth 3 (three) times daily.   Yes Historical Provider, MD  cetirizine (ZYRTEC) 10 MG tablet Take 10 mg by mouth daily.   Yes Historical Provider, MD  Cholecalciferol (VITAMIN D) 2000 UNITS CAPS Take 1 capsule (2,000 Units total) by mouth daily. 11/21/13  Yes [name], NP  Cranberry 200 MG CAPS Take 2 capsules by mouth 2 (two) times daily.   Yes Historical Provider, MD  dextromethorphan-guaiFENesin (MUCINEX DM) 30-600 MG per 12 hr tablet Take 1 tablet by mouth 2 (two) times daily as needed for cough.   Yes Historical Provider, MD  esomeprazole (NEXIUM) 20 MG capsule Take 20 mg by mouth 2 (two) times daily before a meal.   Yes Historical Provider, MD  ferrous sulfate 325 (65 FE) MG tablet Take 325 mg by mouth daily with breakfast.   Yes Historical Provider, MD  HYDROcodone-acetaminophen (NORCO/VICODIN) 5-325 MG per tablet Take one tablet by mouth every 4 to 6 hours as needed for pain. Do not exceed 4gm of Tylenol in 24 hours 07/19/14  Yes Gildardo Cranker, DO  ipratropium (ATROVENT) 0.03 % nasal spray Place 2 sprays into both nostrils every 12 (twelve) hours.   Yes Historical Provider, MD  ipratropium-albuterol (DUONEB) 0.5-2.5 (3) MG/3ML SOLN Inhale 3 mLs into the lungs every 6 (six) hours as needed (wheezing/shortness of breath). Currenlty running, Every 6 hours for 3 days. Course to be completed on 10/28/14- 10/30/14 10/27/14  Yes Historical Provider, MD  levothyroxine (SYNTHROID, LEVOTHROID) 50 MCG tablet Take 50 mcg by mouth daily.    Yes Historical Provider, MD  Lidocaine, Anorectal, 5 % CREA Apply 1 application topically 4 (four) times daily as needed.   Yes Historical Provider, MD  LORazepam (ATIVAN) 0.5 MG tablet Take one tablet by mouth every night at bedtime for anxiety 09/04/14  Yes Tiffany L Reed, DO  Menthol, Topical Analgesic, 4 % GEL Apply 1 application topically 3 (three) times daily  as needed (may leave at bedside).   Yes Historical Provider, MD  methocarbamol (ROBAXIN) 500 MG tablet Take 500 mg by mouth at bedtime.    Yes Historical Provider, MD  metoprolol tartrate (LOPRESSOR) 25 MG tablet Take 25 mg by mouth 2 (two) times daily.   Yes Historical Provider, MD  montelukast (SINGULAIR) 10 MG tablet Take 10 mg by mouth at bedtime.   Yes Historical Provider, MD  Multiple Vitamin (MULTIVITAMIN) tablet Take 1 tablet by mouth daily.     Yes Historical Provider, MD  Omega-3 Fatty Acids (FISH OIL) 1200 MG CAPS Take 1,200 mg by mouth daily.   Yes Historical Provider, MD  Polyethyl Glycol-Propyl Glycol (SYSTANE) 0.4-0.3 % SOLN Apply 1 drop to eye 2 (two) times daily.   Yes Historical Provider, MD  predniSONE (DELTASONE) 10 MG tablet Take 10 mg by mouth daily.   Yes Historical Provider, MD  promethazine (PHENERGAN) 25 MG tablet Take 25 mg by mouth every 6 (six) hours as needed for nausea or vomiting.   Yes Historical Provider, MD  rosuvastatin (CRESTOR) 20 MG tablet Take 20 mg by mouth every evening.  07/05/13  Yes [name], NP  saccharomyces boulardii (FLORASTOR) 250 MG capsule Take 250 mg by mouth 2 (two) times daily.    Yes Historical  Provider, MD  sertraline (ZOLOFT) 50 MG tablet Take 50 mg by mouth every morning.   Yes Historical Provider, MD   Allergies  Allergen Reactions  . Biphosphate   . Codeine     sick  . Morphine And Related Other (See Comments)    unknown  . Percocet [Oxycodone-Acetaminophen]     unknown  . Plavix [Clopidogrel Bisulfate] Other (See Comments)    unknown  . Sulfur Other (See Comments)    unknown    FAMILY HISTORY:  family history includes Kidney disease in her brother and mother; Lung cancer in her father.   SOCIAL HISTORY:  reports that she has never smoked. She has never used smokeless tobacco. She reports that she does not drink alcohol or use illicit drugs.  REVIEW OF SYSTEMS:   Constitutional: Negative for fever, chills, weight  loss, malaise/fatigue and diaphoresis.  HENT: Negative for hearing loss, ear pain, nosebleeds, congestion, sore throat, neck pain, tinnitus and ear discharge.   Eyes: Negative for blurred vision, double vision, photophobia, pain, discharge and redness.  Respiratory: Negative for hemoptysis, shortness of breath, wheezing and stridor.  Pt reports cough, minimal sputum production Cardiovascular: Negative for chest pain, palpitations, orthopnea, claudication, leg swelling and PND.  Gastrointestinal: Negative for heartburn, nausea, vomiting, abdominal pain, diarrhea, constipation, blood in stool and melena.  Genitourinary: Negative for dysuria, urgency, frequency, hematuria and flank pain.  Musculoskeletal: Negative for myalgias, back pain, joint pain and falls.  Skin: Negative for itching and rash.  Neurological: Negative for dizziness, tingling, tremors, sensory change, speech change, focal weakness, seizures, loss of consciousness, weakness and headaches.  Endo/Heme/Allergies: Negative for environmental allergies and polydipsia. Does not bruise/bleed easily.  SUBJECTIVE:  Reports feeling some better since admit.  VITAL SIGNS: Temp:  [97.9 [name] (36.6 C)-99.4 [name] (37.4 C)] 98.3 [name] (36.8 C) (05/05 0800) Pulse Rate:  [Samantha-108] 96 (05/05 0600) Resp:  [21-27] 23 (05/05 0600) BP: (92-145)/(42-102) 112/53 mmHg (05/05 0600) SpO2:  [92 %-100 %] 99 % (05/05 0827) Weight:  [157 lb 13.6 oz (71.6 kg)] 157 lb 13.6 oz (71.6 kg) (05/05 0400)  PHYSICAL EXAMINATION: General:  Elderly female in NAD Neuro:  AAOx4, speech clear, MAE  HEENT:  Mm pink/moist, no jvd  Cardiovascular:  s1s2 rrr, no m/r/g Lungs:  resp's even/non-labored, lungs bilaterally clear  Abdomen:  Obese/soft,bsx4 active  Musculoskeletal:  No acute deformities.  Ulnar drift noted.  Skin:  Warm/dry, no edema.  Thin/fragile skin   Recent Labs Lab 10/31/14 0500 11/01/14 0356 11/02/14 0846  NA 140 140 140  K 4.2 4.1 3.4*  CL 103 107 109    CO2 28 27 24   BUN 49* 38* 32*  CREATININE 1.83* 1.50* 1.54*  GLUCOSE 112* 112* 89    Recent Labs Lab 10/31/14 0500 11/01/14 0356 11/02/14 0340 11/02/14 0846  HGB 10.5* 9.8* 9.5* 10.1*  HCT 34.6* 31.3* 29.5* 31.5*  WBC 16.5* 9.7  --  12.0*  PLT 112* 106*  --  107*   Dg Chest Port 1 View  11/02/2014   CLINICAL DATA:  Community-acquired pneumonia  EXAM: PORTABLE CHEST - 1 VIEW  COMPARISON:  11/01/2014  FINDINGS: Bibasilar airspace disease left greater than right is unchanged. Right upper lobe airspace disease also unchanged  Decreased lung volume with bibasilar atelectasis. Negative for edema or effusion  IMPRESSION: Bilateral infiltrates unchanged.  Possible pneumonia.   Electronically Signed   By: Franchot Gallo M.D.   On: 11/02/2014 08:52   Dg Chest Port 1 View  11/01/2014  CLINICAL DATA:  Shortness of breath, wheezing, congestion. Community acquired pneumonia.  EXAM: PORTABLE CHEST - 1 VIEW  COMPARISON:  10/29/2014  FINDINGS: There is a large hiatal hernia. Very low lung volumes with bibasilar opacities, worsening since prior study. Increasing right upper lobe airspace opacity. No Conference that no effusions or acute bony abnormality. Posterior spinal rods in place.  IMPRESSION: Low lung volumes, worsening since prior study. Increasing right upper lobe and bibasilar airspace opacities. Findings concerning for multifocal pneumonia.   Electronically Signed   By: Rolm Baptise M.D.   On: 11/01/2014 12:16    ASSESSMENT / PLAN:   Acute Hypoxic Respiratory Failure - minimal infiltrate on CT chest, lends more to plate-like atelectasis.  Hx n/v on admit raises concern for possible aspiration. Films not that impressive for overt HCAP  Acute Bronchitis  Small Bilateral Pleural Effusions Lactic Acidosis  Hiatal Hernia, GERD  Diastolic CHF - grade I  Plan: Wean O2 for saturations > 90% Assess ambulatory desaturations prior to d/c Pulmonary hygiene: IS/flutter valve, Mobilize, Chest  PT Continue empiric HCAP coverage Trend BNP  Monitor fever curve / leukocytosis SLP evaluation for swallowing  Anion gap closed, reassuring    Noe Gens, NP-C [facility] Pgr: [phone] or [phone] 11/02/2014, 11:23 AM

## 2014-11-02 NOTE — Progress Notes (Signed)
Patient Demographics  [name], is a 81 y.o. female, DOB - 1929-10-12, UYQ:[id number]  Admit date - 10/29/2014   Admitting Physician Robbie Lis, MD  Outpatient Primary MD for the patient is Samantha Serve, MD  LOS - 4   Chief Complaint  Patient presents with  . Respiratory Distress      Admission history of present illness/brief narrative: 81 year old female with history of remote CVA, hypertension, diastolic CHF, dyslipidemia , presented  with worsening shortness of breath, cough, fever and chills and productive cough. S. No reports of blood in stool or urine. No reports of diarrhea or constipation. On EMS arrival she was given albuterol, Atrovent, solumedrol.   In ED, BP was 111/54, HR 110-149, RR 19-41, T max 101.57F and oxygen saturation 86% on room air. She required BiPAP to keep O2 sats above 90%.She was given nebulizers in ED, lasix 40 mg IV once, cefepime and vanco. She was admitted to SDU since she is on BiPAP.\ Patient respiratory status improved, currently tolerating nasal cannula, and 5/2 AM patient had an episode of coffee-ground emesis, as well as her troponins continues to increase, even though she still denies any chest pain. No further occurrence of coffee-ground emesis, tolerating clear liquid diet, troponins has plateaued and currently trending down.  Subjective:   Kahmya Pinkham today has, No headache, No chest pain, No abdominal pain - No Nausea, No new weakness tingling or numbness, still complains of cough and shortness of breath but overall feels better, denies any chest pain , no further coffee-ground emesis .  Assessment & Plan    Acute respiratory failure with hypoxia / Healthcare associated pneumonia along with acute on chronic diastolic CHF - Pt presented with hypoxia on admission and oxygen saturation of 86% on room. Hypoxia likely  from pneumonia. Now showing infiltrates on CT scan and repeat chest x-ray.  - Initially on BiPAP, currently tolerating nasal cannula and oxygenating well. - CT chest showing evidence of right upper lobe pneumonia -On empiric vancomycin and cefepime. Given trial of Lasix again on 11/02/2014, oxygen nebulizer treatments. Continue flutter valve and increase activity. Encouraged to sit up in the chair. Overall improving. -Family requested pulmonary input which has been requested on 11/02/2014.   Severe sepsis due to pneumonia / Leukocytosis  - Sepsis criteria met on admission with tachycardia, tachypnea, fever, hypoxia. In addition. Lactic acid was 8.7 and procalcitonin 0.21, much improved with empiric antibiotics with negative cultures to date- Continue empiric cefepime and vancomycin   Acute on chronic renal failure stage III. Baseline creatinine close to 1.5 - Dehydration and volume depletion, after initial IV fluids back to baseline   Elevated troponin - Troponin rise and non-ACS pattern secondary to demand ischemia from pneumonia and sepsis. - No acute ischemic changes on 12 lead EKG, completely chest pain-free - 2 D ECHEF 60-65% - held aspirin  given upper GI bleed, on beta blocker & statin - cardiology Input greatly appreciated    1 Episode of coffee-ground emesis this a.m. - This is most likely related to gastritis, esophagitis, given she received IV steroids and full dose aspirin yesterday - No further episodes of coffee-ground emesis, H&H stable, continue with Protonix, advanced diet, stable - Gastroenterologconsult appreciated  Acute on chronic diastolic CHF EF 09% - BNP on admission 1045. - Mild wheezing which likely is due to pulmonary congestion, Repeat Lasix on 11/01/2014 and 11/02/2014, monitor intake and output and BMP closely.   Essential hypertension - Continue metoprolol    Dyslipidemia - Continue Crestor    Iron deficiency anemia anemia of chronic kidney  disease  - Continue ferrous sulfate supplementation  - Hemoglobin stable   Hypothyroidism - Continue synthroid    Hypokalemia and borderline magnesium  Both replaced on 11/02/2014, Will continue to monitor.     Code Status: Full  Family Communication: Son over the phone 11-01-14  Disposition Plan:  Remains in stepdown    Procedures  None   Consults   Cardiology Gastroenterology PCCM   Medications  Scheduled Meds: . alum & mag hydroxide-simeth  30 mL Oral Once  . antiseptic oral rinse  7 mL Mouth Rinse BID  . ceFEPime (MAXIPIME) IV  1 g Intravenous Q24H  . docusate sodium  100 mg Oral Daily  . feeding supplement (RESOURCE BREEZE)  1 Container Oral Q24H  . ferrous sulfate  325 mg Oral Q breakfast  . furosemide  20 mg Intravenous Once  . ipratropium-albuterol  3 mL Nebulization Q6H  . levothyroxine  50 mcg Oral QAC breakfast  . LORazepam  0.5 mg Oral QHS  . magnesium sulfate 1 - 4 g bolus IVPB  1 g Intravenous Once  . methocarbamol  500 mg Oral QHS  . metoprolol tartrate  25 mg Oral BID  . montelukast  10 mg Oral QHS  . pantoprazole  40 mg Oral BID  . potassium chloride  10 mEq Intravenous Q1 Hr x 2  . rosuvastatin  20 mg Oral QPM  . saccharomyces boulardii  250 mg Oral BID  . sertraline  50 mg Oral q morning - 10a  . vancomycin  1,000 mg Intravenous Q24H   Continuous Infusions:   PRN Meds:.acetaminophen, dextromethorphan-guaiFENesin, hydrALAZINE, levalbuterol, ondansetron (ZOFRAN) IV, polyvinyl alcohol, promethazine  DVT Prophylaxis   SCDs   Lab Results  Component Value Date   PLT 107* 11/02/2014    Antibiotics   Anti-infectives    Start     Dose/Rate Route Frequency Ordered Stop   10/30/14 1200  ceFEPIme (MAXIPIME) 1 g in dextrose 5 % 50 mL IVPB     1 g 100 mL/hr over 30 Minutes Intravenous Every 24 hours 10/29/14 1835     10/30/14 1100  vancomycin (VANCOCIN) IVPB 1000 mg/200 mL premix  Status:  Discontinued     1,000 mg 200 mL/hr over 60  Minutes Intravenous Every 24 hours 10/29/14 1147 10/29/14 1458   10/30/14 1100  ceFEPIme (MAXIPIME) 1 g in dextrose 5 % 50 mL IVPB  Status:  Discontinued     1 g 100 mL/hr over 30 Minutes Intravenous Every 24 hours 10/29/14 1147 10/29/14 1458   10/30/14 1100  vancomycin (VANCOCIN) IVPB 1000 mg/200 mL premix     1,000 mg 200 mL/hr over 60 Minutes Intravenous Every 24 hours 10/29/14 1835     10/29/14 1300  ceFEPIme (MAXIPIME) 2 g in dextrose 5 % 50 mL IVPB  Status:  Discontinued     2 g 100 mL/hr over 30 Minutes Intravenous  Once 10/29/14 1258 10/29/14 1304   10/29/14 1300  vancomycin (VANCOCIN) IVPB 1000 mg/200 mL premix  Status:  Discontinued     1,000 mg 200 mL/hr over 60 Minutes Intravenous  Once 10/29/14 1258 10/29/14 1304   10/29/14 1045  ceFEPIme (  MAXIPIME) 2 g in dextrose 5 % 50 mL IVPB     2 g 100 mL/hr over 30 Minutes Intravenous  Once 10/29/14 1032 10/29/14 1220   10/29/14 1045  vancomycin (VANCOCIN) IVPB 1000 mg/200 mL premix     1,000 mg 200 mL/hr over 60 Minutes Intravenous  Once 10/29/14 1032 10/29/14 1220          Objective:   Filed Vitals:   11/02/14 0400 11/02/14 0600 11/02/14 0800 11/02/14 0827  BP: 109/49 112/53    Pulse: 95 96    Temp: 99.1 F (37.3 C)  98.3 F (36.8 C)   TempSrc: Oral  Oral   Resp: 24 23    Height:      Weight: 71.6 kg (157 lb 13.6 oz)     SpO2: 97% 97%  99%    Wt Readings from Last 3 Encounters:  11/02/14 71.6 kg (157 lb 13.6 oz)  10/04/14 71.668 kg (158 lb)  09/05/14 71.668 kg (158 lb)     Intake/Output Summary (Last 24 hours) at 11/02/14 0943 Last data filed at 11/02/14 0600  Gross per 24 hour  Intake 722.92 ml  Output    850 ml  Net -127.08 ml     Physical Exam  Awake Alert, Oriented,in mild resp distress coughing, No new F.N deficits, Normal affect [place].AT,PERRAL Supple Neck,No JVD, No cervical lymphadenopathy appriciated.  Symmetrical Chest wall movement, bibasilar rales & coarse bilateral breath sounds, minimal  wheezing,   tachycardic,No Gallops,Rubs or new Murmurs, No Parasternal Heave +ve B.Sounds, Abd Soft, No tenderness, No organomegaly appriciated, No rebound - guarding or rigidity. No Cyanosis, Clubbing or edema, No new Rash or bruise    Data Review   Micro Results Recent Results (from the past 240 hour(s))  Culture, blood (x 2)     Status: None (Preliminary result)   Collection Time: 10/29/14  1:23 PM  Result Value Ref Range Status   Specimen Description BLOOD RIGHT ANTECUBITAL  Final   Special Requests BOTTLES DRAWN AEROBIC AND ANAEROBIC 5CC EACH  Final   Culture   Final           BLOOD CULTURE RECEIVED NO GROWTH TO DATE CULTURE WILL BE HELD FOR 5 DAYS BEFORE ISSUING A FINAL NEGATIVE REPORT Performed at [facility]    Report Status PENDING  Incomplete  Culture, blood (x 2)     Status: None (Preliminary result)   Collection Time: 10/29/14  1:40 PM  Result Value Ref Range Status   Specimen Description BLOOD RIGHT WRIST  Final   Special Requests BOTTLES DRAWN AEROBIC AND ANAEROBIC 5CC EACH  Final   Culture   Final           BLOOD CULTURE RECEIVED NO GROWTH TO DATE CULTURE WILL BE HELD FOR 5 DAYS BEFORE ISSUING A FINAL NEGATIVE REPORT Performed at [facility]    Report Status PENDING  Incomplete  MRSA PCR Screening     Status: None   Collection Time: 10/29/14  3:07 PM  Result Value Ref Range Status   MRSA by PCR NEGATIVE NEGATIVE Final    Comment:        The GeneXpert MRSA Assay (FDA approved for NASAL specimens only), is one component of a comprehensive MRSA colonization surveillance program. It is not intended to diagnose MRSA infection nor to guide or monitor treatment for MRSA infections.     Radiology Reports Dg Chest Port 1 View  11/02/2014   CLINICAL DATA:  Community-acquired pneumonia  EXAM:  PORTABLE CHEST - 1 VIEW  COMPARISON:  11/01/2014  FINDINGS: Bibasilar airspace disease left greater than right is unchanged. Right upper lobe airspace  disease also unchanged  Decreased lung volume with bibasilar atelectasis. Negative for edema or effusion  IMPRESSION: Bilateral infiltrates unchanged.  Possible pneumonia.   Electronically Signed   By: Franchot Gallo M.D.   On: 11/02/2014 08:52   Dg Chest Port 1 View  11/01/2014   CLINICAL DATA:  Shortness of breath, wheezing, congestion. Community acquired pneumonia.  EXAM: PORTABLE CHEST - 1 VIEW  COMPARISON:  10/29/2014  FINDINGS: There is a large hiatal hernia. Very low lung volumes with bibasilar opacities, worsening since prior study. Increasing right upper lobe airspace opacity. No Conference that no effusions or acute bony abnormality. Posterior spinal rods in place.  IMPRESSION: Low lung volumes, worsening since prior study. Increasing right upper lobe and bibasilar airspace opacities. Findings concerning for multifocal pneumonia.   Electronically Signed   By: Rolm Baptise M.D.   On: 11/01/2014 12:16    CBC  Recent Labs Lab 10/29/14 1029 10/30/14 0414 10/30/14 1447 10/31/14 0500 11/01/14 0356 11/02/14 0340 11/02/14 0846  WBC 14.1* 21.6*  --  16.5* 9.7  --  12.0*  HGB 11.8* 11.4* 10.9* 10.5* 9.8* 9.5* 10.1*  HCT 38.2 36.5 35.3* 34.6* 31.3* 29.5* 31.5*  PLT 110* 117*  --  112* 106*  --  107*  MCV 90.5 88.4  --  90.8 91.3  --  89.2  MCH 28.0 27.6  --  27.6 28.6  --  28.6  MCHC 30.9 31.2  --  30.3 31.3  --  32.1  RDW 15.7* 15.5  --  16.0* 16.3*  --  15.9*  LYMPHSABS 1.6  --   --   --   --   --   --   MONOABS 1.0  --   --   --   --   --   --   EOSABS 0.1  --   --   --   --   --   --   BASOSABS 0.0  --   --   --   --   --   --     Chemistries   Recent Labs Lab 10/29/14 1029 10/29/14 1659 10/30/14 0414 10/31/14 0500 11/01/14 0356 11/02/14 0846  NA 138 137 138 140 140 140  K 4.4 4.1 4.0 4.2 4.1 3.4*  CL 109 104 101 103 107 109  CO2 23 13* $Remo'26 28 27 24  'DHGbd$ GLUCOSE 133* 352* 175* 112* 112* 89  BUN 17 21* 33* 49* 38* 32*  CREATININE 1.19* 1.80* 1.51* 1.83* 1.50* 1.54*   CALCIUM 8.5* 9.1 10.2 9.2 8.5* 8.0*  MG  --  1.9  --   --   --  1.7  AST 39 44* 41  --   --   --   ALT $Re'19 23 25  'yqg$ --   --   --   ALKPHOS 47 47 48  --   --   --   BILITOT 1.5* 0.8 0.9  --   --   --    ------------------------------------------------------------------------------------------------------------------ estimated creatinine clearance is 28.5 mL/min (by C-G formula based on Cr of 1.54). ------------------------------------------------------------------------------------------------------------------ No results for input(s): HGBA1C in the last 72 hours. ------------------------------------------------------------------------------------------------------------------ No results for input(s): CHOL, HDL, LDLCALC, TRIG, CHOLHDL, LDLDIRECT in the last 72 hours. ------------------------------------------------------------------------------------------------------------------ No results for input(s): TSH, T4TOTAL, T3FREE, THYROIDAB in the last 72 hours.  Invalid input(s): FREET3 ------------------------------------------------------------------------------------------------------------------ No results for  input(s): VITAMINB12, FOLATE, FERRITIN, TIBC, IRON, RETICCTPCT in the last 72 hours.  Coagulation profile  Recent Labs Lab 10/29/14 1029  INR 1.02    No results for input(s): DDIMER in the last 72 hours.  Cardiac Enzymes  Recent Labs Lab 10/31/14 0500 10/31/14 1056 10/31/14 1714  TROPONINI 0.56* 0.47* 0.38*   ------------------------------------------------------------------------------------------------------------------ Invalid input(s): POCBNP     Time Spent in minutes   30  minutes   Lala Lund K M.D on 11/02/2014 at 9:43 AM  Between 7am to 7pm - Pager - [phone]  After 7pm go to [email or web address] - password [email or web address]  And look for the night coverage person covering for me after hours  [facility] Office  [phone]   **Disclaimer: This  note may have been dictated with voice recognition software. Similar sounding words can inadvertently be transcribed and this note may contain transcription errors which may not have been corrected upon publication of note.**

## 2014-11-02 NOTE — Care Management Note (Signed)
Case Management Note  Patient Details  Name: [name] MRN: [id number] Date of Birth: [birth year]  Subjective/Objective:                    Action/Plan:   Expected Discharge Date:  11/05/14               Expected Discharge Plan:  Home/Self Care  In-House Referral:  NA  Discharge planning Services  CM Consult  Post Acute Care Choice:    Choice offered to:     DME Arranged:    DME Agency:     HH Arranged:    HH Agency:     Status of Service:  In process, will continue to follow  Medicare Important Message Given:    Date Medicare IM Given:    Medicare IM give by:    Date Additional Medicare IM Given:    Additional Medicare Important Message give by:     If discussed at [place] of Stay Meetings, dates discussed:    Additional Comments:  Leeroy Cha, RN 11/02/2014, 1:10 PM

## 2014-11-02 NOTE — Evaluation (Signed)
Clinical/Bedside Swallow Evaluation Patient Details  Name: Kylei Purington MRN: [id number] Date of Birth: [birth year]  Today's Date: 11/02/2014 Time: SLP Start Time (ACUTE ONLY): 1411 SLP Stop Time (ACUTE ONLY): 1424 SLP Time Calculation (min) (ACUTE ONLY): 13 min  Past Medical History:  Past Medical History  Diagnosis Date  . SOB (shortness of breath)   . MI (myocardial infarction)   . Poor appetite   . Arthritis   . Fatigue   . Right ear pain   . Renal insufficiency   . RA (rheumatoid arthritis)   . PUD (peptic ulcer disease)   . GI bleed 2005  . GERD (gastroesophageal reflux disease)   . Hiatal hernia   . Hypothyroidism   . Osteoporosis   . Depression   . Anxiety   . Coronary artery disease   . CVA (cerebral vascular accident)   . TIA (transient ischemic attack)   . Fall   . DVT of lower extremity (deep venous thrombosis)   . HCAP (healthcare-associated pneumonia) 01/22/2013  . Enteritis due to Clostridium difficile 01/04/2013  . Sepsis 12/19/2012  . Recurrent colitis due to Clostridium difficile 06/02/2012  . Hypertension   . Hyperlipidemia   . Fatigue   . Weight loss   . Hemorrhoids   . Malnutrition   . Dyspnea    Past Surgical History:  Past Surgical History  Procedure Laterality Date  . Nstemi  06/2010  . Spinal fusion surgery    . Knee surgery    . Cardiac catheterization      SHOWED RUPTURE PLAQUE IN THE LAD. THE LAD IS NONOBSTRUCTIVE WITH ONLY 30-40% STENOSIS  . Back surgery    . Breast surgery  1964    x3  . Ankle surgery    . Colonoscopy     HPI:  81 yo female adm to [facility] with coffee ground emesis episode on 10/30/14.  PMH + for CVA, HTN, CHF, productive cough, dysphagia.  CXR 11/02/14 bilateral infiltrates - no changes - ? pna.  Pt with possible esophagitis.  Swallow evaluation ordered.     Assessment / Plan / Recommendation Clinical Impression  Limited assessment completed secondary to pt politely declining to consume more intake beyond a few sips of  gingerale.  CN exam unremarkable.  Poor positioning in chair which will increase pt's aspiration risk.  Pt denies dysphagia.  Delayed cough noted via straw bolus of gingerale - Pt denied SLP offer of further intake. SLP reviewed in detail findings of previous esophagram and MBS to pt.   Question if pt's emesis may have been aspirated.  Advised pt to consume liquids via tsp or cup rather than straw to maximize airway protection.  Pt needs to se fully upright for all intake.    Recommend continue diet as tolerated and SLP to follow up to help pt mitigate dysphagia symptoms and determine instrumental evaluation indicated.      Aspiration Risk  Moderate    Diet Recommendation Age appropriate regular solids;Thin   Medication Administration: Whole meds with puree Compensations: Slow rate;Small sips/bites;Follow solids with liquid (rest if short of breath or coughing, sit fully upright with meals)    Other  Recommendations Oral Care Recommendations: Oral care BID   Follow Up Recommendations    TBD   Frequency and Duration    1 week   Pertinent Vitals/Pain Low grade fever      Swallow Study Prior Functional Status   see hhx    General Date of Onset: 11/02/14 Other Pertinent Information:  81 yo female adm to [facility] with coffee ground emesis episode on 10/30/14.  PMH + for CVA, HTN, CHF, productive cough, dysphagia.  CXR 11/02/14 bilateral infiltrates - no changes - ? pna.  Pt with possible esophagitis.  Swallow evaluation ordered.   Type of Study: Bedside swallow evaluation Previous Swallow Assessment: MBS 03/2013:  mild oral and suspected primary esophageal dysphagia, no aspiration or penetration, rec regular/thin, esophagram 2014 prominent CP, hiatal hernia, esophageal dysmotility Diet Prior to this Study: Regular;Thin liquids Temperature Spikes Noted: No Respiratory Status: Supplemental O2 delivered via (comment) History of Recent Intubation: No Behavior/Cognition: Alert;Cooperative;Pleasant  mood Oral Cavity - Dentition: Adequate natural dentition/normal for age Self-Feeding Abilities: Able to feed self Patient Positioning: Upright in chair/Tumbleform Baseline Vocal Quality: Low vocal intensity;Hoarse Volitional Cough: Weak Volitional Swallow: Unable to elicit    Oral/Motor/Sensory Function Overall Oral Motor/Sensory Function:  (generalized weakness)   Ice Chips Ice chips: Not tested   Thin Liquid Thin Liquid: Impaired Presentation: Straw Pharyngeal  Phase Impairments: Cough - Delayed Other Comments: poor positoning in chair noted, advised pt to sit fully upright for meal    Nectar Thick Nectar Thick Liquid: Not tested Other Comments: pt declined   Honey Thick Honey Thick Liquid: Not tested   Puree Puree: Not tested Other Comments: pt declined   Solid   GO    Solid: Not tested Other Comments: pt declined       Claudie Fisherman, [facility] SLP [phone]

## 2014-11-02 NOTE — Progress Notes (Signed)
Date:  Nov 02, 2014 U.R. performed for needs and level of care. Will continue to follow for Case Management needs.  Marquan Vokes, RN, BSN, CCM   [phone] 

## 2014-11-03 LAB — BASIC METABOLIC PANEL
Anion gap: 7 (ref 5–15)
BUN: 28 mg/dL — AB (ref 6–20)
CALCIUM: 7.9 mg/dL — AB (ref 8.9–10.3)
CO2: 23 mmol/L (ref 22–32)
CREATININE: 1.7 mg/dL — AB (ref 0.44–1.00)
Chloride: 106 mmol/L (ref 101–111)
GFR calc Af Amer: 31 mL/min — ABNORMAL LOW (ref >60)
GFR calc non Af Amer: 27 mL/min — ABNORMAL LOW (ref >60)
Glucose, Bld: 100 mg/dL — ABNORMAL HIGH (ref 70–99)
Potassium: 3.5 mmol/L (ref 3.5–5.1)
SODIUM: 136 mmol/L (ref 135–145)

## 2014-11-03 LAB — CBC
HCT: 29.6 % — ABNORMAL LOW (ref 36.0–46.0)
HEMOGLOBIN: 9.5 g/dL — AB (ref 12.0–15.0)
MCH: 28.2 pg (ref 26.0–34.0)
MCHC: 32.1 g/dL (ref 30.0–36.0)
MCV: 87.8 fL (ref 78.0–100.0)
Platelets: 109 10*3/uL — ABNORMAL LOW (ref 150–400)
RBC: 3.37 MIL/uL — ABNORMAL LOW (ref 3.87–5.11)
RDW: 15.6 % — ABNORMAL HIGH (ref 11.5–15.5)
WBC: 11.7 10*3/uL — ABNORMAL HIGH (ref 4.0–10.5)

## 2014-11-03 LAB — GLUCOSE, CAPILLARY: Glucose-Capillary: 115 mg/dL — ABNORMAL HIGH (ref 70–99)

## 2014-11-03 LAB — MAGNESIUM: Magnesium: 2 mg/dL (ref 1.7–2.4)

## 2014-11-03 MED ORDER — POTASSIUM CHLORIDE CRYS ER 20 MEQ PO TBCR
20.0000 meq | EXTENDED_RELEASE_TABLET | Freq: Once | ORAL | Status: AC
  Administered 2014-11-03: 20 meq via ORAL
  Filled 2014-11-03: qty 1

## 2014-11-03 MED ORDER — POTASSIUM CHLORIDE 10 MEQ/100ML IV SOLN
10.0000 meq | INTRAVENOUS | Status: AC
  Administered 2014-11-03: 10 meq via INTRAVENOUS
  Filled 2014-11-03: qty 100

## 2014-11-03 MED ORDER — DOXYCYCLINE HYCLATE 100 MG PO TABS
100.0000 mg | ORAL_TABLET | Freq: Two times a day (BID) | ORAL | Status: DC
  Administered 2014-11-03 – 2014-11-06 (×7): 100 mg via ORAL
  Filled 2014-11-03 (×7): qty 1

## 2014-11-03 NOTE — Progress Notes (Addendum)
CSW continuing to follow.  Pt transferred to [address] [place] and CSW followed up with pt at bedside.   Pt alone in room; no family present.   Pt alert and oriented to person, place, and situation.   CSW provided support as pt expressed that she was tired.   CSW confirmed with pt that she continues to plan to return to [facility] when medically ready.   CSW inquired with pt if pt wanted CSW to contact pt family to discuss anything surrounding pt returning to [facility] and pt stated that she did not want CSW to contact family.   CSW inquired with pt about continuing to update [facility] regarding pt care needs for continuity of care. Pt states that she does not want [place] updated at this time. CSW explained that CSW had sent [place] following initial assessment of pt as pt expressed wishes to return to [facility] and did not express at that time that she did not wish for [facility] to be updated as this is standard with pt from facilities and planning to return. CSW shared that it will be important to send [facility] discharge information once pt discharged in order to ensure continuity of care and that [facility] can get pt needed medications. CSW explained to pt that CSW will check in with pt prior to sending Discharge summary when pt medically ready for discharge..  CSW provided verbal handoff to [place] CSW, Raynaldo Opitz who will continue to follow.  Addendum 5:03 pm:  CSW received notification from nursing unit that pt son, Eviana Sibilia called requesting to speak with CSW regarding pt returning to [facility].   Pt had expressed earlier to CSW that she did not wish for CSW to speak with family.  CSW re-visited pt room and notified pt that pt son had called nursing unit requesting to speak with CSW and inquired with pt if pt would allow CSW to contact pt son and offered to contact him in the room with pt if needed. Pt stated again that she did not wish for  CSW to speak with pt son. Pt alert and oriented and able to make her own decisions. Pt did not elaborate on reasoning, but states that she plans to return to [facility].   CSW discussed with RN and RN entered room to discuss with pt. Pt continued to state that she does not wish for CSW to contact pt son or pt family.   CSW to respect pt wishes as pt alert and oriented. RN updated.   CSW to continue to follow.   Alison Murray, MSW, [place] Work [phone]

## 2014-11-03 NOTE — Progress Notes (Signed)
Patient Demographics  [name], is a 81 y.o. female, DOB - March 21, 1930, ZOX:[id number]  Admit date - 10/29/2014   Admitting Physician Robbie Lis, MD  Outpatient Primary MD for the patient is [name], MD  LOS - 5   Chief Complaint  Patient presents with  . Respiratory Distress      Admission history of present illness/brief narrative: 81 year old female with history of remote CVA, hypertension, diastolic CHF, dyslipidemia , presented  with worsening shortness of breath, cough, fever and chills and productive cough. S. No reports of blood in stool or urine. No reports of diarrhea or constipation. On EMS arrival she was given albuterol, Atrovent, solumedrol.   In ED, BP was 111/54, HR 110-149, RR 19-41, T max 101.24F and oxygen saturation 86% on room air. She required BiPAP to keep O2 sats above 90%.She was given nebulizers in ED, lasix 40 mg IV once, cefepime and vanco. She was admitted to SDU since she is on BiPAP.  Patient respiratory status improved, currently tolerating nasal cannula, and 5/2 AM patient had an episode of coffee-ground emesis, as well as her troponins continues to increase, even though she still denies any chest pain. No further occurrence of coffee-ground emesis, tolerating clear liquid diet, troponins has plateaued and currently trending down.  She has been seen by cardiology, pulmonary and GI. Improved. We'll move her from stepdown to floor. Likely will require SNF placement.  Subjective:   [name] today has, No headache, No chest pain, No abdominal pain - No Nausea, No new weakness tingling or numbness, improved cough and shortness of breath but overall feels better, denies any chest pain , no further coffee-ground emesis .  Assessment & Plan    Acute respiratory failure with hypoxia / Healthcare associated pneumonia along with  acute on chronic diastolic CHF - She required BiPAP. CT scan and repeat chest x-ray done on 11/02/2014 shows questionable infiltrate versus atelectasis. She had impressive leukocytosis, acute hypoxic respiratory failure, productive cough and elevated lactate on the day of admission. All pointing towards infectious etiology.  - She has been treated with adequate HCAP antibiotics, will taper her to Doxy on 11/03/2014. She is off of BiPAP, oxygenation is improved, she had evidence of fluid overload as well for which she has been diuresed with Lasix 2 doses.   -She is much improved clinically. Seen by pulmonary critical care. We'll try to titrate off oxygen, continue supportive care with flutter valve, pulmonary hygiene, increase activity and nebulizer treatments. Likely discharge in the next 1-2 days.    Severe sepsis due to pneumonia / Leukocytosis  - Resolved after supportive care as in #1 above.   Acute on chronic renal failure stage III. Baseline creatinine close to 1.5 to 1.7 owing back 1-1/2 years - Close to baseline monitor.   Elevated troponin - Troponin rise and non-ACS pattern secondary to demand ischemia from pneumonia and sepsis. EKG nonacute, echogram with a preserved EF of 60% without any wall motion abnormality. Seen by Cards. Continue beta blocker and statin. Cannot give aspirin due to recent GI bleed.     1 Episode of coffee-ground emesis   - This is most likely related to gastritis, esophagitis, given she received IV steroids and full  dose aspirin he seemed initially. H&H stable, on oral PPI now. GI has seen. No further inpatient workup.   Acute on chronic diastolic CHF EF 11% -Old after 2 doses of IV Lasix on 5/4 and 11/02/2014   Essential hypertension - Continue metoprolol    Dyslipidemia - Continue Crestor    Iron deficiency anemia anemia of chronic kidney disease  - Continue ferrous sulfate supplementation, Hemoglobin stable   Hypothyroidism - Continue  synthroid       Code Status: Full  Family Communication: Son over the phone 11-01-14  Disposition Plan:  Remains in stepdown    Procedures  None   Consults   Cardiology Gastroenterology PCCM   Medications  Scheduled Meds: . antiseptic oral rinse  7 mL Mouth Rinse BID  . docusate sodium  100 mg Oral Daily  . doxycycline  100 mg Oral Q12H  . feeding supplement (RESOURCE BREEZE)  1 Container Oral Q24H  . ferrous sulfate  325 mg Oral Q breakfast  . ipratropium-albuterol  3 mL Nebulization Q6H  . levothyroxine  50 mcg Oral QAC breakfast  . LORazepam  0.5 mg Oral QHS  . methocarbamol  500 mg Oral QHS  . metoprolol tartrate  25 mg Oral BID  . montelukast  10 mg Oral QHS  . pantoprazole  40 mg Oral BID  . rosuvastatin  20 mg Oral QPM  . saccharomyces boulardii  250 mg Oral BID  . sertraline  50 mg Oral q morning - 10a   Continuous Infusions:   PRN Meds:.acetaminophen, dextromethorphan-guaiFENesin, hydrALAZINE, levalbuterol, ondansetron (ZOFRAN) IV, polyvinyl alcohol, promethazine  DVT Prophylaxis   SCDs   Lab Results  Component Value Date   PLT 109* 11/03/2014    Antibiotics   Anti-infectives    Start     Dose/Rate Route Frequency Ordered Stop   11/03/14 1000  doxycycline (VIBRA-TABS) tablet 100 mg     100 mg Oral Every 12 hours 11/03/14 0724     10/30/14 1200  ceFEPIme (MAXIPIME) 1 g in dextrose 5 % 50 mL IVPB  Status:  Discontinued     1 g 100 mL/hr over 30 Minutes Intravenous Every 24 hours 10/29/14 1835 11/03/14 0724   10/30/14 1100  vancomycin (VANCOCIN) IVPB 1000 mg/200 mL premix  Status:  Discontinued     1,000 mg 200 mL/hr over 60 Minutes Intravenous Every 24 hours 10/29/14 1147 10/29/14 1458   10/30/14 1100  ceFEPIme (MAXIPIME) 1 g in dextrose 5 % 50 mL IVPB  Status:  Discontinued     1 g 100 mL/hr over 30 Minutes Intravenous Every 24 hours 10/29/14 1147 10/29/14 1458   10/30/14 1100  vancomycin (VANCOCIN) IVPB 1000 mg/200 mL premix  Status:   Discontinued     1,000 mg 200 mL/hr over 60 Minutes Intravenous Every 24 hours 10/29/14 1835 11/03/14 0724   10/29/14 1300  ceFEPIme (MAXIPIME) 2 g in dextrose 5 % 50 mL IVPB  Status:  Discontinued     2 g 100 mL/hr over 30 Minutes Intravenous  Once 10/29/14 1258 10/29/14 1304   10/29/14 1300  vancomycin (VANCOCIN) IVPB 1000 mg/200 mL premix  Status:  Discontinued     1,000 mg 200 mL/hr over 60 Minutes Intravenous  Once 10/29/14 1258 10/29/14 1304   10/29/14 1045  ceFEPIme (MAXIPIME) 2 g in dextrose 5 % 50 mL IVPB     2 g 100 mL/hr over 30 Minutes Intravenous  Once 10/29/14 1032 10/29/14 1220   10/29/14 1045  vancomycin (VANCOCIN) IVPB 1000  mg/200 mL premix     1,000 mg 200 mL/hr over 60 Minutes Intravenous  Once 10/29/14 1032 10/29/14 1220          Objective:   Filed Vitals:   11/03/14 0200 11/03/14 0300 11/03/14 0400 11/03/14 0800  BP: 129/74  127/54 137/53  Pulse: 96  95 94  Temp:   98.6 F (37 C) 97.9 F (36.6 C)  TempSrc:   Oral Oral  Resp: 22  23 20   Height:      Weight:      SpO2: 98% 98% 97% 98%    Wt Readings from Last 3 Encounters:  11/02/14 71.6 kg (157 lb 13.6 oz)  10/04/14 71.668 kg (158 lb)  09/05/14 71.668 kg (158 lb)     Intake/Output Summary (Last 24 hours) at 11/03/14 0933 Last data filed at 11/03/14 0800  Gross per 24 hour  Intake    685 ml  Output   1650 ml  Net   -965 ml     Physical Exam  Awake Alert, Oriented,in mild resp distress coughing, No new F.N deficits, Normal affect [place].AT,PERRAL Supple Neck,No JVD, No cervical lymphadenopathy appriciated.  Symmetrical Chest wall movement, bibasilar rales & coarse bilateral breath sounds, minimal wheezing,   tachycardic,No Gallops,Rubs or new Murmurs, No Parasternal Heave +ve B.Sounds, Abd Soft, No tenderness, No organomegaly appriciated, No rebound - guarding or rigidity. No Cyanosis, Clubbing or edema, No new Rash or bruise    Data Review   Micro Results Recent Results (from the past  240 hour(s))  Culture, blood (x 2)     Status: None (Preliminary result)   Collection Time: 10/29/14  1:23 PM  Result Value Ref Range Status   Specimen Description BLOOD RIGHT ANTECUBITAL  Final   Special Requests BOTTLES DRAWN AEROBIC AND ANAEROBIC 5CC EACH  Final   Culture   Final           BLOOD CULTURE RECEIVED NO GROWTH TO DATE CULTURE WILL BE HELD FOR 5 DAYS BEFORE ISSUING A FINAL NEGATIVE REPORT Performed at [facility]    Report Status PENDING  Incomplete  Culture, blood (x 2)     Status: None (Preliminary result)   Collection Time: 10/29/14  1:40 PM  Result Value Ref Range Status   Specimen Description BLOOD RIGHT WRIST  Final   Special Requests BOTTLES DRAWN AEROBIC AND ANAEROBIC 5CC EACH  Final   Culture   Final           BLOOD CULTURE RECEIVED NO GROWTH TO DATE CULTURE WILL BE HELD FOR 5 DAYS BEFORE ISSUING A FINAL NEGATIVE REPORT Performed at [facility]    Report Status PENDING  Incomplete  MRSA PCR Screening     Status: None   Collection Time: 10/29/14  3:07 PM  Result Value Ref Range Status   MRSA by PCR NEGATIVE NEGATIVE Final    Comment:        The GeneXpert MRSA Assay (FDA approved for NASAL specimens only), is one component of a comprehensive MRSA colonization surveillance program. It is not intended to diagnose MRSA infection nor to guide or monitor treatment for MRSA infections.     Radiology Reports Dg Chest Port 1 View  11/02/2014   CLINICAL DATA:  Community-acquired pneumonia  EXAM: PORTABLE CHEST - 1 VIEW  COMPARISON:  11/01/2014  FINDINGS: Bibasilar airspace disease left greater than right is unchanged. Right upper lobe airspace disease also unchanged  Decreased lung volume with bibasilar atelectasis. Negative for edema or  effusion  IMPRESSION: Bilateral infiltrates unchanged.  Possible pneumonia.   Electronically Signed   By: Franchot Gallo M.D.   On: 11/02/2014 08:52   Dg Chest Port 1 View  11/01/2014   CLINICAL DATA:  Shortness  of breath, wheezing, congestion. Community acquired pneumonia.  EXAM: PORTABLE CHEST - 1 VIEW  COMPARISON:  10/29/2014  FINDINGS: There is a large hiatal hernia. Very low lung volumes with bibasilar opacities, worsening since prior study. Increasing right upper lobe airspace opacity. No Conference that no effusions or acute bony abnormality. Posterior spinal rods in place.  IMPRESSION: Low lung volumes, worsening since prior study. Increasing right upper lobe and bibasilar airspace opacities. Findings concerning for multifocal pneumonia.   Electronically Signed   By: Rolm Baptise M.D.   On: 11/01/2014 12:16    CBC  Recent Labs Lab 10/29/14 1029 10/30/14 0414  10/31/14 0500 11/01/14 0356 11/02/14 0340 11/02/14 0846 11/03/14 0350  WBC 14.1* 21.6*  --  16.5* 9.7  --  12.0* 11.7*  HGB 11.8* 11.4*  < > 10.5* 9.8* 9.5* 10.1* 9.5*  HCT 38.2 36.5  < > 34.6* 31.3* 29.5* 31.5* 29.6*  PLT 110* 117*  --  112* 106*  --  107* 109*  MCV 90.5 88.4  --  90.8 91.3  --  89.2 87.8  MCH 28.0 27.6  --  27.6 28.6  --  28.6 28.2  MCHC 30.9 31.2  --  30.3 31.3  --  32.1 32.1  RDW 15.7* 15.5  --  16.0* 16.3*  --  15.9* 15.6*  LYMPHSABS 1.6  --   --   --   --   --   --   --   MONOABS 1.0  --   --   --   --   --   --   --   EOSABS 0.1  --   --   --   --   --   --   --   BASOSABS 0.0  --   --   --   --   --   --   --   < > = values in this interval not displayed.  Chemistries   Recent Labs Lab 10/29/14 1029 10/29/14 1659 10/30/14 0414 10/31/14 0500 11/01/14 0356 11/02/14 0846 11/03/14 0350  NA 138 137 138 140 140 140 136  K 4.4 4.1 4.0 4.2 4.1 3.4* 3.5  CL 109 104 101 103 107 109 106  CO2 23 13* 26 28 27 24 23   GLUCOSE 133* 352* 175* 112* 112* 89 100*  BUN 17 21* 33* 49* 38* 32* 28*  CREATININE 1.19* 1.80* 1.51* 1.83* 1.50* 1.54* 1.70*  CALCIUM 8.5* 9.1 10.2 9.2 8.5* 8.0* 7.9*  MG  --  1.9  --   --   --  1.7 2.0  AST 39 44* 41  --   --   --   --   ALT 19 23 25   --   --   --   --   ALKPHOS 47 47  48  --   --   --   --   BILITOT 1.5* 0.8 0.9  --   --   --   --    ------------------------------------------------------------------------------------------------------------------ estimated creatinine clearance is 25.9 mL/min (by C-G formula based on Cr of 1.7). ------------------------------------------------------------------------------------------------------------------ No results for input(s): HGBA1C in the last 72 hours. ------------------------------------------------------------------------------------------------------------------ No results for input(s): CHOL, HDL, LDLCALC, TRIG, CHOLHDL, LDLDIRECT in the last 72 hours. ------------------------------------------------------------------------------------------------------------------ No results for  input(s): TSH, T4TOTAL, T3FREE, THYROIDAB in the last 72 hours.  Invalid input(s): FREET3 ------------------------------------------------------------------------------------------------------------------ No results for input(s): VITAMINB12, FOLATE, FERRITIN, TIBC, IRON, RETICCTPCT in the last 72 hours.  Coagulation profile  Recent Labs Lab 10/29/14 1029  INR 1.02    No results for input(s): DDIMER in the last 72 hours.  Cardiac Enzymes  Recent Labs Lab 10/31/14 0500 10/31/14 1056 10/31/14 1714  TROPONINI 0.56* 0.47* 0.38*   ------------------------------------------------------------------------------------------------------------------ Invalid input(s): POCBNP     Time Spent in minutes   30  minutes   Lala Lund K M.D on 11/03/2014 at 9:33 AM  Between 7am to 7pm - Pager - [phone]  After 7pm go to [email or web address] - password [email or web address]  And look for the night coverage person covering for me after hours  [facility] Office  [phone]   **Disclaimer: This note may have been dictated with voice recognition software. Similar sounding words can inadvertently be transcribed and this note may  contain transcription errors which may not have been corrected upon publication of note.**

## 2014-11-03 NOTE — Progress Notes (Signed)
SLP Cancellation Note  Patient Details Name: [name] MRN: [id number] DOB: [birth year]   Cancelled treatment:       Reason Eval/Treat Not Completed: Other (comment);Fatigue/lethargy limiting ability to participate   Luanna Salk, [facility] SLP [phone]

## 2014-11-04 LAB — BASIC METABOLIC PANEL
Anion gap: 9 (ref 5–15)
BUN: 27 mg/dL — AB (ref 6–20)
CO2: 25 mmol/L (ref 22–32)
Calcium: 8.4 mg/dL — ABNORMAL LOW (ref 8.9–10.3)
Chloride: 107 mmol/L (ref 101–111)
Creatinine, Ser: 1.72 mg/dL — ABNORMAL HIGH (ref 0.44–1.00)
GFR calc Af Amer: 31 mL/min — ABNORMAL LOW (ref >60)
GFR calc non Af Amer: 26 mL/min — ABNORMAL LOW (ref >60)
Glucose, Bld: 123 mg/dL — ABNORMAL HIGH (ref 70–99)
Potassium: 3.4 mmol/L — ABNORMAL LOW (ref 3.5–5.1)
Sodium: 141 mmol/L (ref 135–145)

## 2014-11-04 LAB — GLUCOSE, CAPILLARY
Glucose-Capillary: 122 mg/dL — ABNORMAL HIGH (ref 70–99)
Glucose-Capillary: 210 mg/dL — ABNORMAL HIGH (ref 70–99)

## 2014-11-04 LAB — CULTURE, BLOOD (ROUTINE X 2)
CULTURE: NO GROWTH
Culture: NO GROWTH

## 2014-11-04 MED ORDER — HYDROCOD POLST-CPM POLST ER 10-8 MG/5ML PO SUER
5.0000 mL | Freq: Two times a day (BID) | ORAL | Status: DC | PRN
  Administered 2014-11-05 – 2014-11-06 (×2): 5 mL via ORAL
  Filled 2014-11-04 (×2): qty 5

## 2014-11-04 MED ORDER — METHYLPREDNISOLONE SODIUM SUCC 125 MG IJ SOLR
60.0000 mg | Freq: Once | INTRAMUSCULAR | Status: AC
  Administered 2014-11-04: 60 mg via INTRAVENOUS
  Filled 2014-11-04: qty 2

## 2014-11-04 MED ORDER — BENZONATATE 100 MG PO CAPS
100.0000 mg | ORAL_CAPSULE | Freq: Three times a day (TID) | ORAL | Status: DC | PRN
Start: 2014-11-04 — End: 2014-11-06
  Administered 2014-11-05 – 2014-11-06 (×3): 100 mg via ORAL
  Filled 2014-11-04 (×4): qty 1

## 2014-11-04 MED ORDER — POTASSIUM CHLORIDE CRYS ER 20 MEQ PO TBCR
40.0000 meq | EXTENDED_RELEASE_TABLET | Freq: Once | ORAL | Status: AC
  Administered 2014-11-04: 40 meq via ORAL
  Filled 2014-11-04: qty 2

## 2014-11-04 MED ORDER — BENZONATATE 100 MG PO CAPS
100.0000 mg | ORAL_CAPSULE | Freq: Once | ORAL | Status: AC
  Administered 2014-11-04: 100 mg via ORAL
  Filled 2014-11-04: qty 1

## 2014-11-04 MED ORDER — GUAIFENESIN-CODEINE 100-10 MG/5ML PO SOLN: 10.0000 mL | ORAL | Status: DC | PRN

## 2014-11-04 NOTE — Progress Notes (Signed)
Patient Demographics  [name], is a 81 y.o. female, DOB - 01-Aug-1929, QIO:[id number]  Admit date - 10/29/2014   Admitting Physician Robbie Lis, MD  Outpatient Primary MD for the patient is [name], MD  LOS - 6   Chief Complaint  Patient presents with  . Respiratory Distress      Admission history of present illness/brief narrative: 81 year old female with history of remote CVA, hypertension, diastolic CHF, dyslipidemia , presented  with worsening shortness of breath, cough, fever and chills and productive cough. S. No reports of blood in stool or urine. No reports of diarrhea or constipation. On EMS arrival she was given albuterol, Atrovent, solumedrol.   In ED, BP was 111/54, HR 110-149, RR 19-41, T max 101.32F and oxygen saturation 86% on room air. She required BiPAP to keep O2 sats above 90%.She was given nebulizers in ED, lasix 40 mg IV once, cefepime and vanco. She was admitted to SDU since she is on BiPAP.  Patient respiratory status improved, currently tolerating nasal cannula, and 5/2 AM patient had an episode of coffee-ground emesis, as well as her troponins continues to increase, even though she still denies any chest pain. No further occurrence of coffee-ground emesis, tolerating clear liquid diet, troponins has plateaued and currently trending down.  She has been seen by cardiology, pulmonary and GI. Improved. We'll move her from stepdown to floor. Likely will require SNF placement.  Subjective:   [name] today has, No headache, No chest pain, No abdominal pain - No Nausea, No new weakness tingling or numbness, improved cough and shortness of breath but overall feels better, has agreed to sit up in the chair in the daytime today.  Assessment & Plan    Acute respiratory failure with hypoxia / Healthcare associated pneumonia along with  acute on chronic diastolic CHF - She required BiPAP. CT scan and repeat chest x-ray done on 11/02/2014 shows questionable infiltrate versus atelectasis. She had impressive leukocytosis, acute hypoxic respiratory failure, productive cough and elevated lactate on the day of admission. All pointing towards infectious etiology.  - She has been treated with adequate HCAP antibiotics, will taper her to Doxy on 11/03/2014. She is off of BiPAP, oxygenation is improved, she had evidence of fluid overload as well for which she has been diuresed with Lasix 2 doses.   -She is much improved clinically. Seen by pulmonary critical care. We'll try to titrate off oxygen, continue supportive care with flutter valve, pulmonary hygiene, increase activity and nebulizer treatments. Likely discharge in the next 1-2 days. Since she still has some wheezing will give her a trial of SoluMedrol on 11/04/2014 and monitor.    Severe sepsis due to pneumonia / Leukocytosis  - Resolved after supportive care as in #1 above.   Acute on chronic renal failure stage III. Baseline creatinine close to 1.5 to 1.7 owing back 1-1/2 years - Close to baseline monitor.   Elevated troponin - Troponin rise and non-ACS pattern secondary to demand ischemia from pneumonia and sepsis. EKG nonacute, echogram with a preserved EF of 60% without any wall motion abnormality. Seen by Cards. Continue beta blocker and statin. Cannot give aspirin due to recent GI bleed.     1 Episode of coffee-ground  emesis   - This is most likely related to gastritis, esophagitis, given she received IV steroids and full dose aspirin he seemed initially. H&H stable, on oral PPI now. GI has seen. No further inpatient workup.   Acute on chronic diastolic CHF EF 85% -Old after 2 doses of IV Lasix on 5/4 and 11/02/2014   Essential hypertension - Continue metoprolol    Dyslipidemia - Continue Crestor    Iron deficiency anemia anemia of chronic kidney disease   - Continue ferrous sulfate supplementation, Hemoglobin stable   Hypothyroidism - Continue synthroid       Code Status: Full  Family Communication: Son over the phone 11-01-14, daughter over the phone 11/03/2014  Disposition Plan:  Remains in stepdown    Procedures  None   Consults   Cardiology Gastroenterology [facility]   Medications  Scheduled Meds: . antiseptic oral rinse  7 mL Mouth Rinse BID  . docusate sodium  100 mg Oral Daily  . doxycycline  100 mg Oral Q12H  . feeding supplement (RESOURCE BREEZE)  1 Container Oral Q24H  . ferrous sulfate  325 mg Oral Q breakfast  . ipratropium-albuterol  3 mL Nebulization Q6H  . levothyroxine  50 mcg Oral QAC breakfast  . LORazepam  0.5 mg Oral QHS  . methocarbamol  500 mg Oral QHS  . metoprolol tartrate  25 mg Oral BID  . montelukast  10 mg Oral QHS  . pantoprazole  40 mg Oral BID  . rosuvastatin  20 mg Oral QPM  . saccharomyces boulardii  250 mg Oral BID  . sertraline  50 mg Oral q morning - 10a   Continuous Infusions:   PRN Meds:.acetaminophen, benzonatate, guaiFENesin-codeine, hydrALAZINE, levalbuterol, ondansetron (ZOFRAN) IV, polyvinyl alcohol, promethazine  DVT Prophylaxis   SCDs   Lab Results  Component Value Date   PLT 109* 11/03/2014    Antibiotics   Anti-infectives    Start     Dose/Rate Route Frequency Ordered Stop   11/03/14 1000  doxycycline (VIBRA-TABS) tablet 100 mg     100 mg Oral Every 12 hours 11/03/14 0724     10/30/14 1200  ceFEPIme (MAXIPIME) 1 g in dextrose 5 % 50 mL IVPB  Status:  Discontinued     1 g 100 mL/hr over 30 Minutes Intravenous Every 24 hours 10/29/14 1835 11/03/14 0724   10/30/14 1100  vancomycin (VANCOCIN) IVPB 1000 mg/200 mL premix  Status:  Discontinued     1,000 mg 200 mL/hr over 60 Minutes Intravenous Every 24 hours 10/29/14 1147 10/29/14 1458   10/30/14 1100  ceFEPIme (MAXIPIME) 1 g in dextrose 5 % 50 mL IVPB  Status:  Discontinued     1 g 100 mL/hr over 30 Minutes  Intravenous Every 24 hours 10/29/14 1147 10/29/14 1458   10/30/14 1100  vancomycin (VANCOCIN) IVPB 1000 mg/200 mL premix  Status:  Discontinued     1,000 mg 200 mL/hr over 60 Minutes Intravenous Every 24 hours 10/29/14 1835 11/03/14 0724   10/29/14 1300  ceFEPIme (MAXIPIME) 2 g in dextrose 5 % 50 mL IVPB  Status:  Discontinued     2 g 100 mL/hr over 30 Minutes Intravenous  Once 10/29/14 1258 10/29/14 1304   10/29/14 1300  vancomycin (VANCOCIN) IVPB 1000 mg/200 mL premix  Status:  Discontinued     1,000 mg 200 mL/hr over 60 Minutes Intravenous  Once 10/29/14 1258 10/29/14 1304   10/29/14 1045  ceFEPIme (MAXIPIME) 2 g in dextrose 5 % 50 mL IVPB  2 g 100 mL/hr over 30 Minutes Intravenous  Once 10/29/14 1032 10/29/14 1220   10/29/14 1045  vancomycin (VANCOCIN) IVPB 1000 mg/200 mL premix     1,000 mg 200 mL/hr over 60 Minutes Intravenous  Once 10/29/14 1032 10/29/14 1220          Objective:   Filed Vitals:   11/03/14 2118 11/03/14 2155 11/04/14 0502 11/04/14 0732  BP: 122/49 134/41 143/50   Pulse: 107 105 96   Temp: 98.8 F (37.1 C)  97.9 F (36.6 C)   TempSrc: Oral  Oral   Resp: 19  18   Height:      Weight:   71.1 kg (156 lb 12 oz)   SpO2: 96%  98% 94%    Wt Readings from Last 3 Encounters:  11/04/14 71.1 kg (156 lb 12 oz)  10/04/14 71.668 kg (158 lb)  09/05/14 71.668 kg (158 lb)     Intake/Output Summary (Last 24 hours) at 11/04/14 1044 Last data filed at 11/03/14 2125  Gross per 24 hour  Intake      0 ml  Output    350 ml  Net   -350 ml     Physical Exam  Awake Alert, Oriented,in mild resp distress coughing, No new F.N deficits, Normal affect [place].AT,PERRAL Supple Neck,No JVD, No cervical lymphadenopathy appriciated.  Symmetrical Chest wall movement, bibasilar rales & coarse bilateral breath sounds, minimal wheezing,   tachycardic,No Gallops,Rubs or new Murmurs, No Parasternal Heave +ve B.Sounds, Abd Soft, No tenderness, No organomegaly appriciated, No  rebound - guarding or rigidity. No Cyanosis, Clubbing or edema, No new Rash or bruise    Data Review   Micro Results Recent Results (from the past 240 hour(s))  Culture, blood (x 2)     Status: None (Preliminary result)   Collection Time: 10/29/14  1:23 PM  Result Value Ref Range Status   Specimen Description BLOOD RIGHT ANTECUBITAL  Final   Special Requests BOTTLES DRAWN AEROBIC AND ANAEROBIC 5CC EACH  Final   Culture   Final           BLOOD CULTURE RECEIVED NO GROWTH TO DATE CULTURE WILL BE HELD FOR 5 DAYS BEFORE ISSUING A FINAL NEGATIVE REPORT Performed at [facility]    Report Status PENDING  Incomplete  Culture, blood (x 2)     Status: None (Preliminary result)   Collection Time: 10/29/14  1:40 PM  Result Value Ref Range Status   Specimen Description BLOOD RIGHT WRIST  Final   Special Requests BOTTLES DRAWN AEROBIC AND ANAEROBIC 5CC EACH  Final   Culture   Final           BLOOD CULTURE RECEIVED NO GROWTH TO DATE CULTURE WILL BE HELD FOR 5 DAYS BEFORE ISSUING A FINAL NEGATIVE REPORT Performed at [facility]    Report Status PENDING  Incomplete  MRSA PCR Screening     Status: None   Collection Time: 10/29/14  3:07 PM  Result Value Ref Range Status   MRSA by PCR NEGATIVE NEGATIVE Final    Comment:        The GeneXpert MRSA Assay (FDA approved for NASAL specimens only), is one component of a comprehensive MRSA colonization surveillance program. It is not intended to diagnose MRSA infection nor to guide or monitor treatment for MRSA infections.     Radiology Reports No results found.  CBC  Recent Labs Lab 10/29/14 1029 10/30/14 0414  10/31/14 0500 11/01/14 0356 11/02/14 0340 11/02/14 9371 11/03/14  0350  WBC 14.1* 21.6*  --  16.5* 9.7  --  12.0* 11.7*  HGB 11.8* 11.4*  < > 10.5* 9.8* 9.5* 10.1* 9.5*  HCT 38.2 36.5  < > 34.6* 31.3* 29.5* 31.5* 29.6*  PLT 110* 117*  --  112* 106*  --  107* 109*  MCV 90.5 88.4  --  90.8 91.3  --  89.2 87.8   MCH 28.0 27.6  --  27.6 28.6  --  28.6 28.2  MCHC 30.9 31.2  --  30.3 31.3  --  32.1 32.1  RDW 15.7* 15.5  --  16.0* 16.3*  --  15.9* 15.6*  LYMPHSABS 1.6  --   --   --   --   --   --   --   MONOABS 1.0  --   --   --   --   --   --   --   EOSABS 0.1  --   --   --   --   --   --   --   BASOSABS 0.0  --   --   --   --   --   --   --   < > = values in this interval not displayed.  Chemistries   Recent Labs Lab 10/29/14 1029 10/29/14 1659 10/30/14 0414 10/31/14 0500 11/01/14 0356 11/02/14 0846 11/03/14 0350 11/04/14 0519  NA 138 137 138 140 140 140 136 141  K 4.4 4.1 4.0 4.2 4.1 3.4* 3.5 3.4*  CL 109 104 101 103 107 109 106 107  CO2 23 13* 26 28 27 24 23 25   GLUCOSE 133* 352* 175* 112* 112* 89 100* 123*  BUN 17 21* 33* 49* 38* 32* 28* 27*  CREATININE 1.19* 1.80* 1.51* 1.83* 1.50* 1.54* 1.70* 1.72*  CALCIUM 8.5* 9.1 10.2 9.2 8.5* 8.0* 7.9* 8.4*  MG  --  1.9  --   --   --  1.7 2.0  --   AST 39 44* 41  --   --   --   --   --   ALT 19 23 25   --   --   --   --   --   ALKPHOS 47 47 48  --   --   --   --   --   BILITOT 1.5* 0.8 0.9  --   --   --   --   --    ------------------------------------------------------------------------------------------------------------------ estimated creatinine clearance is 23.6 mL/min (by C-G formula based on Cr of 1.72). ------------------------------------------------------------------------------------------------------------------ No results for input(s): HGBA1C in the last 72 hours. ------------------------------------------------------------------------------------------------------------------ No results for input(s): CHOL, HDL, LDLCALC, TRIG, CHOLHDL, LDLDIRECT in the last 72 hours. ------------------------------------------------------------------------------------------------------------------ No results for input(s): TSH, T4TOTAL, T3FREE, THYROIDAB in the last 72 hours.  Invalid input(s):  FREET3 ------------------------------------------------------------------------------------------------------------------ No results for input(s): VITAMINB12, FOLATE, FERRITIN, TIBC, IRON, RETICCTPCT in the last 72 hours.  Coagulation profile  Recent Labs Lab 10/29/14 1029  INR 1.02    No results for input(s): DDIMER in the last 72 hours.  Cardiac Enzymes  Recent Labs Lab 10/31/14 0500 10/31/14 1056 10/31/14 1714  TROPONINI 0.56* 0.47* 0.38*   ------------------------------------------------------------------------------------------------------------------ Invalid input(s): POCBNP     Time Spent in minutes   30  minutes   Lala Lund K M.D on 11/04/2014 at 10:44 AM  Between 7am to 7pm - Pager - [phone]  After 7pm go to [email or web address] - password [email or web address]  And look for the night coverage person covering for me after  hours  [facility] Office  [phone]   **Disclaimer: This note may have been dictated with voice recognition software. Similar sounding words can inadvertently be transcribed and this note may contain transcription errors which may not have been corrected upon publication of note.**

## 2014-11-05 LAB — GLUCOSE, CAPILLARY: Glucose-Capillary: 148 mg/dL — ABNORMAL HIGH (ref 70–99)

## 2014-11-05 LAB — POTASSIUM: Potassium: 4.5 mmol/L (ref 3.5–5.1)

## 2014-11-05 MED ORDER — ALBUTEROL SULFATE (2.5 MG/3ML) 0.083% IN NEBU
2.5000 mg | INHALATION_SOLUTION | RESPIRATORY_TRACT | Status: DC | PRN
Start: 2014-11-05 — End: 2014-11-06
  Administered 2014-11-06: 2.5 mg via RESPIRATORY_TRACT
  Filled 2014-11-05: qty 3

## 2014-11-05 MED ORDER — PROMETHAZINE HCL 25 MG PO TABS
25.0000 mg | ORAL_TABLET | Freq: Four times a day (QID) | ORAL | Status: DC | PRN
  Administered 2014-11-05: 25 mg via ORAL
  Filled 2014-11-05: qty 1

## 2014-11-05 MED ORDER — METHYLPREDNISOLONE SODIUM SUCC 40 MG IJ SOLR
40.0000 mg | Freq: Every day | INTRAMUSCULAR | Status: DC
  Administered 2014-11-05 – 2014-11-06 (×2): 40 mg via INTRAVENOUS
  Filled 2014-11-05 (×2): qty 1

## 2014-11-05 MED ORDER — IPRATROPIUM-ALBUTEROL 0.5-2.5 (3) MG/3ML IN SOLN
3.0000 mL | Freq: Three times a day (TID) | RESPIRATORY_TRACT | Status: DC
  Administered 2014-11-05 – 2014-11-06 (×4): 3 mL via RESPIRATORY_TRACT
  Filled 2014-11-05 (×4): qty 3

## 2014-11-05 NOTE — Progress Notes (Signed)
Patient Demographics  [name], is a 81 y.o. female, DOB - 1929-08-26, BMW:[id number]  Admit date - 10/29/2014   Admitting Physician Robbie Lis, MD  Outpatient Primary MD for the patient is Samantha Serve, MD  LOS - 7   Chief Complaint  Patient presents with  . Respiratory Distress      Admission history of present illness/brief narrative: 81 year old female with history of remote CVA, hypertension, diastolic CHF, dyslipidemia , presented  with worsening shortness of breath, cough, fever and chills and productive cough. S. No reports of blood in stool or urine. No reports of diarrhea or constipation. On EMS arrival she was given albuterol, Atrovent, solumedrol.   In ED, BP was 111/54, HR 110-149, RR 19-41, T max 101.20F and oxygen saturation 86% on room air. She required BiPAP to keep O2 sats above 90%.She was given nebulizers in ED, lasix 40 mg IV once, cefepime and vanco. She was admitted to SDU since she is on BiPAP.  Patient respiratory status improved, currently tolerating nasal cannula, and 5/2 AM patient had an episode of coffee-ground emesis, as well as her troponins continues to increase, even though she still denies any chest pain. No further occurrence of coffee-ground emesis, tolerating clear liquid diet, troponins has plateaued and currently trending down.  She has been seen by cardiology, pulmonary and GI. Improved. We'll move her from stepdown to floor. Likely will require SNF placement.  Subjective:   [name] today has, No headache, No chest pain, No abdominal pain - No Nausea, No new weakness tingling or numbness, improved cough and shortness of breath but overall feels better, has agreed to sit up in the chair in the daytime today.  Assessment & Plan    Acute respiratory failure with hypoxia / Healthcare associated pneumonia along with  acute on chronic diastolic CHF - She required BiPAP. CT scan and repeat chest x-ray done on 11/02/2014 shows questionable infiltrate versus atelectasis. She had impressive leukocytosis, acute hypoxic respiratory failure, productive cough and elevated lactate on the day of admission. All pointing towards infectious etiology.  - She has been treated with adequate HCAP antibiotics, will taper her to Doxy on 11/03/2014. She is off of BiPAP, oxygenation is improved, she had evidence of fluid overload as well for which she has been diuresed with Lasix 2 doses.   -She is much improved clinically. Seen by pulmonary critical care. We'll try to titrate off oxygen, she is currently 93% on room air, continue supportive care with flutter valve, pulmonary hygiene, increase activity and nebulizer treatments. She had some wheezing despite adequate diuresis, a trial of Solu-Medrol was started on 11/04/2014 with much improvement in symptoms and wheezing. Question undiagnosed asthma versus COPD due to secondhand smoking. We'll request outpatient pulmonary follow-up post discharge. Likely discharge to SNF tomorrow.     Severe sepsis due to pneumonia / Leukocytosis  - Resolved after supportive care as in #1 above.   Acute on chronic renal failure stage III. Baseline creatinine close to 1.5 to 1.7 owing back 1-1/2 years - Close to baseline monitor.   Elevated troponin - Troponin rise and non-ACS pattern secondary to demand ischemia from pneumonia and sepsis. EKG nonacute, echogram with a preserved EF of 60% without any  wall motion abnormality. Seen by Cards. Continue beta blocker and statin. Cannot give aspirin due to recent GI bleed.     1 Episode of coffee-ground emesis  - This is most likely related to gastritis, esophagitis, given she received IV steroids and full dose aspirin he seemed initially. H&H stable, on oral PPI now. GI has seen. No further inpatient workup.   Acute on chronic diastolic CHF EF 19%  resolved after 2 doses of IV Lasix on 5/4 and 11/02/2014   Essential hypertension - Continue metoprolol    Dyslipidemia - Continue Crestor    Iron deficiency anemia anemia of chronic kidney disease - Continue ferrous sulfate supplementation, Hemoglobin stable   Hypothyroidism - Continue synthroid    Hypo-kalemia. Has been replaced will recheck potassium   Code Status: Full  Family Communication: Son over the phone 11-01-14, daughter over the phone 11/03/2014, son again over the phone 11/04/2014  Disposition Plan:  Likely discharge 11/06/2014 to SNF    Procedures  None   Consults   Cardiology Gastroenterology PCCM   Medications  Scheduled Meds: . antiseptic oral rinse  7 mL Mouth Rinse BID  . docusate sodium  100 mg Oral Daily  . doxycycline  100 mg Oral Q12H  . feeding supplement (RESOURCE BREEZE)  1 Container Oral Q24H  . ferrous sulfate  325 mg Oral Q breakfast  . ipratropium-albuterol  3 mL Nebulization TID  . levothyroxine  50 mcg Oral QAC breakfast  . LORazepam  0.5 mg Oral QHS  . methocarbamol  500 mg Oral QHS  . methylPREDNISolone (SOLU-MEDROL) injection  40 mg Intravenous Daily  . metoprolol tartrate  25 mg Oral BID  . montelukast  10 mg Oral QHS  . pantoprazole  40 mg Oral BID  . rosuvastatin  20 mg Oral QPM  . saccharomyces boulardii  250 mg Oral BID  . sertraline  50 mg Oral q morning - 10a   Continuous Infusions:   PRN Meds:.acetaminophen, albuterol, benzonatate, chlorpheniramine-HYDROcodone, hydrALAZINE, ondansetron (ZOFRAN) IV, polyvinyl alcohol, promethazine  DVT Prophylaxis   SCDs   Lab Results  Component Value Date   PLT 109* 11/03/2014    Antibiotics   Anti-infectives    Start     Dose/Rate Route Frequency Ordered Stop   11/03/14 1000  doxycycline (VIBRA-TABS) tablet 100 mg     100 mg Oral Every 12 hours 11/03/14 0724     10/30/14 1200  ceFEPIme (MAXIPIME) 1 g in dextrose 5 % 50 mL IVPB  Status:  Discontinued     1 g 100 mL/hr  over 30 Minutes Intravenous Every 24 hours 10/29/14 1835 11/03/14 0724   10/30/14 1100  vancomycin (VANCOCIN) IVPB 1000 mg/200 mL premix  Status:  Discontinued     1,000 mg 200 mL/hr over 60 Minutes Intravenous Every 24 hours 10/29/14 1147 10/29/14 1458   10/30/14 1100  ceFEPIme (MAXIPIME) 1 g in dextrose 5 % 50 mL IVPB  Status:  Discontinued     1 g 100 mL/hr over 30 Minutes Intravenous Every 24 hours 10/29/14 1147 10/29/14 1458   10/30/14 1100  vancomycin (VANCOCIN) IVPB 1000 mg/200 mL premix  Status:  Discontinued     1,000 mg 200 mL/hr over 60 Minutes Intravenous Every 24 hours 10/29/14 1835 11/03/14 0724   10/29/14 1300  ceFEPIme (MAXIPIME) 2 g in dextrose 5 % 50 mL IVPB  Status:  Discontinued     2 g 100 mL/hr over 30 Minutes Intravenous  Once 10/29/14 1258 10/29/14 1304   10/29/14  1300  vancomycin (VANCOCIN) IVPB 1000 mg/200 mL premix  Status:  Discontinued     1,000 mg 200 mL/hr over 60 Minutes Intravenous  Once 10/29/14 1258 10/29/14 1304   10/29/14 1045  ceFEPIme (MAXIPIME) 2 g in dextrose 5 % 50 mL IVPB     2 g 100 mL/hr over 30 Minutes Intravenous  Once 10/29/14 1032 10/29/14 1220   10/29/14 1045  vancomycin (VANCOCIN) IVPB 1000 mg/200 mL premix     1,000 mg 200 mL/hr over 60 Minutes Intravenous  Once 10/29/14 1032 10/29/14 1220          Objective:   Filed Vitals:   11/04/14 1509 11/04/14 2104 11/05/14 0509 11/05/14 0827  BP:  129/92 145/71   Pulse:  100 97   Temp:  99.4 F (37.4 C) 98.4 F (36.9 C)   TempSrc:  Oral Oral   Resp:  18 18   Height:      Weight:      SpO2: 99% 96% 95% 93%    Wt Readings from Last 3 Encounters:  11/04/14 71.1 kg (156 lb 12 oz)  10/04/14 71.668 kg (158 lb)  09/05/14 71.668 kg (158 lb)     Intake/Output Summary (Last 24 hours) at 11/05/14 0928 Last data filed at 11/05/14 0511  Gross per 24 hour  Intake      0 ml  Output    750 ml  Net   -750 ml     Physical Exam  Awake Alert, Oriented,in mild resp distress coughing,  No new F.N deficits, Normal affect, claims chronic left-sided weakness from a previous stroke [place].AT,PERRAL Supple Neck,No JVD, No cervical lymphadenopathy appriciated.  Symmetrical Chest wall movement, bibasilar rales & coarse bilateral breath sounds, minimal wheezing improved,   tachycardic,No Gallops,Rubs or new Murmurs, No Parasternal Heave +ve B.Sounds, Abd Soft, No tenderness, No organomegaly appriciated, No rebound - guarding or rigidity. No Cyanosis, Clubbing or edema, No new Rash or bruise    Data Review   Micro Results Recent Results (from the past 240 hour(s))  Culture, blood (x 2)     Status: None   Collection Time: 10/29/14  1:23 PM  Result Value Ref Range Status   Specimen Description BLOOD RIGHT ANTECUBITAL  Final   Special Requests BOTTLES DRAWN AEROBIC AND ANAEROBIC 5CC EACH  Final   Culture   Final    NO GROWTH 5 DAYS Performed at [facility]    Report Status 11/04/2014 FINAL  Final  Culture, blood (x 2)     Status: None   Collection Time: 10/29/14  1:40 PM  Result Value Ref Range Status   Specimen Description BLOOD RIGHT WRIST  Final   Special Requests BOTTLES DRAWN AEROBIC AND ANAEROBIC 5CC EACH  Final   Culture   Final    NO GROWTH 5 DAYS Performed at [facility]    Report Status 11/04/2014 FINAL  Final  MRSA PCR Screening     Status: None   Collection Time: 10/29/14  3:07 PM  Result Value Ref Range Status   MRSA by PCR NEGATIVE NEGATIVE Final    Comment:        The GeneXpert MRSA Assay (FDA approved for NASAL specimens only), is one component of a comprehensive MRSA colonization surveillance program. It is not intended to diagnose MRSA infection nor to guide or monitor treatment for MRSA infections.     Radiology Reports No results found.  CBC  Recent Labs Lab 10/29/14 1029 10/30/14 0414  10/31/14 0500  11/01/14 0356 11/02/14 0340 11/02/14 0846 11/03/14 0350  WBC 14.1* 21.6*  --  16.5* 9.7  --  12.0* 11.7*  HGB  11.8* 11.4*  < > 10.5* 9.8* 9.5* 10.1* 9.5*  HCT 38.2 36.5  < > 34.6* 31.3* 29.5* 31.5* 29.6*  PLT 110* 117*  --  112* 106*  --  107* 109*  MCV 90.5 88.4  --  90.8 91.3  --  89.2 87.8  MCH 28.0 27.6  --  27.6 28.6  --  28.6 28.2  MCHC 30.9 31.2  --  30.3 31.3  --  32.1 32.1  RDW 15.7* 15.5  --  16.0* 16.3*  --  15.9* 15.6*  LYMPHSABS 1.6  --   --   --   --   --   --   --   MONOABS 1.0  --   --   --   --   --   --   --   EOSABS 0.1  --   --   --   --   --   --   --   BASOSABS 0.0  --   --   --   --   --   --   --   < > = values in this interval not displayed.  Chemistries   Recent Labs Lab 10/29/14 1029 10/29/14 1659 10/30/14 0414 10/31/14 0500 11/01/14 0356 11/02/14 0846 11/03/14 0350 11/04/14 0519  NA 138 137 138 140 140 140 136 141  K 4.4 4.1 4.0 4.2 4.1 3.4* 3.5 3.4*  CL 109 104 101 103 107 109 106 107  CO2 23 13* 26 28 27 24 23 25   GLUCOSE 133* 352* 175* 112* 112* 89 100* 123*  BUN 17 21* 33* 49* 38* 32* 28* 27*  CREATININE 1.19* 1.80* 1.51* 1.83* 1.50* 1.54* 1.70* 1.72*  CALCIUM 8.5* 9.1 10.2 9.2 8.5* 8.0* 7.9* 8.4*  MG  --  1.9  --   --   --  1.7 2.0  --   AST 39 44* 41  --   --   --   --   --   ALT 19 23 25   --   --   --   --   --   ALKPHOS 47 47 48  --   --   --   --   --   BILITOT 1.5* 0.8 0.9  --   --   --   --   --    ------------------------------------------------------------------------------------------------------------------ estimated creatinine clearance is 23.6 mL/min (by C-G formula based on Cr of 1.72). ------------------------------------------------------------------------------------------------------------------ No results for input(s): HGBA1C in the last 72 hours. ------------------------------------------------------------------------------------------------------------------ No results for input(s): CHOL, HDL, LDLCALC, TRIG, CHOLHDL, LDLDIRECT in the last 72  hours. ------------------------------------------------------------------------------------------------------------------ No results for input(s): TSH, T4TOTAL, T3FREE, THYROIDAB in the last 72 hours.  Invalid input(s): FREET3 ------------------------------------------------------------------------------------------------------------------ No results for input(s): VITAMINB12, FOLATE, FERRITIN, TIBC, IRON, RETICCTPCT in the last 72 hours.  Coagulation profile  Recent Labs Lab 10/29/14 1029  INR 1.02    No results for input(s): DDIMER in the last 72 hours.  Cardiac Enzymes  Recent Labs Lab 10/31/14 0500 10/31/14 1056 10/31/14 1714  TROPONINI 0.56* 0.47* 0.38*   ------------------------------------------------------------------------------------------------------------------ Invalid input(s): POCBNP     Time Spent in minutes   30  minutes   Lala Lund K M.D on 11/05/2014 at 9:28 AM  Between 7am to 7pm - Pager - [phone]  After 7pm go to [email or web address] - password [email or web address]  And look for the  night coverage person covering for me after hours  [facility] Office  [phone]   **Disclaimer: This note may have been dictated with voice recognition software. Similar sounding words can inadvertently be transcribed and this note may contain transcription errors which may not have been corrected upon publication of note.**

## 2014-11-06 DIAGNOSIS — H04123 Dry eye syndrome of bilateral lacrimal glands: Secondary | ICD-10-CM | POA: Diagnosis not present

## 2014-11-06 DIAGNOSIS — D509 Iron deficiency anemia, unspecified: Secondary | ICD-10-CM | POA: Diagnosis not present

## 2014-11-06 DIAGNOSIS — R1312 Dysphagia, oropharyngeal phase: Secondary | ICD-10-CM | POA: Diagnosis not present

## 2014-11-06 DIAGNOSIS — I2510 Atherosclerotic heart disease of native coronary artery without angina pectoris: Secondary | ICD-10-CM | POA: Diagnosis not present

## 2014-11-06 DIAGNOSIS — I110 Hypertensive heart disease with heart failure: Secondary | ICD-10-CM | POA: Diagnosis not present

## 2014-11-06 DIAGNOSIS — E785 Hyperlipidemia, unspecified: Secondary | ICD-10-CM | POA: Diagnosis not present

## 2014-11-06 DIAGNOSIS — Z7952 Long term (current) use of systemic steroids: Secondary | ICD-10-CM | POA: Diagnosis not present

## 2014-11-06 DIAGNOSIS — M62838 Other muscle spasm: Secondary | ICD-10-CM | POA: Diagnosis not present

## 2014-11-06 DIAGNOSIS — J80 Acute respiratory distress syndrome: Secondary | ICD-10-CM | POA: Diagnosis not present

## 2014-11-06 DIAGNOSIS — H1044 Vernal conjunctivitis: Secondary | ICD-10-CM | POA: Diagnosis not present

## 2014-11-06 DIAGNOSIS — Z961 Presence of intraocular lens: Secondary | ICD-10-CM | POA: Diagnosis not present

## 2014-11-06 DIAGNOSIS — I5033 Acute on chronic diastolic (congestive) heart failure: Secondary | ICD-10-CM | POA: Diagnosis not present

## 2014-11-06 DIAGNOSIS — I5032 Chronic diastolic (congestive) heart failure: Secondary | ICD-10-CM | POA: Diagnosis not present

## 2014-11-06 DIAGNOSIS — J189 Pneumonia, unspecified organism: Secondary | ICD-10-CM | POA: Diagnosis not present

## 2014-11-06 DIAGNOSIS — K922 Gastrointestinal hemorrhage, unspecified: Secondary | ICD-10-CM | POA: Diagnosis not present

## 2014-11-06 DIAGNOSIS — I509 Heart failure, unspecified: Secondary | ICD-10-CM | POA: Diagnosis not present

## 2014-11-06 DIAGNOSIS — J9621 Acute and chronic respiratory failure with hypoxia: Secondary | ICD-10-CM | POA: Diagnosis not present

## 2014-11-06 DIAGNOSIS — F418 Other specified anxiety disorders: Secondary | ICD-10-CM | POA: Diagnosis not present

## 2014-11-06 DIAGNOSIS — N183 Chronic kidney disease, stage 3 (moderate): Secondary | ICD-10-CM | POA: Diagnosis not present

## 2014-11-06 DIAGNOSIS — R630 Anorexia: Secondary | ICD-10-CM | POA: Diagnosis not present

## 2014-11-06 DIAGNOSIS — E039 Hypothyroidism, unspecified: Secondary | ICD-10-CM | POA: Diagnosis not present

## 2014-11-06 DIAGNOSIS — R0902 Hypoxemia: Secondary | ICD-10-CM | POA: Diagnosis not present

## 2014-11-06 DIAGNOSIS — J188 Other pneumonia, unspecified organism: Secondary | ICD-10-CM | POA: Diagnosis not present

## 2014-11-06 DIAGNOSIS — R5381 Other malaise: Secondary | ICD-10-CM | POA: Diagnosis not present

## 2014-11-06 DIAGNOSIS — E2740 Unspecified adrenocortical insufficiency: Secondary | ICD-10-CM | POA: Diagnosis not present

## 2014-11-06 DIAGNOSIS — I421 Obstructive hypertrophic cardiomyopathy: Secondary | ICD-10-CM | POA: Diagnosis not present

## 2014-11-06 DIAGNOSIS — A419 Sepsis, unspecified organism: Secondary | ICD-10-CM | POA: Diagnosis not present

## 2014-11-06 DIAGNOSIS — R2681 Unsteadiness on feet: Secondary | ICD-10-CM | POA: Diagnosis not present

## 2014-11-06 DIAGNOSIS — R262 Difficulty in walking, not elsewhere classified: Secondary | ICD-10-CM | POA: Diagnosis not present

## 2014-11-06 DIAGNOSIS — M6281 Muscle weakness (generalized): Secondary | ICD-10-CM | POA: Diagnosis not present

## 2014-11-06 DIAGNOSIS — J9601 Acute respiratory failure with hypoxia: Secondary | ICD-10-CM | POA: Diagnosis not present

## 2014-11-06 DIAGNOSIS — K210 Gastro-esophageal reflux disease with esophagitis: Secondary | ICD-10-CM | POA: Diagnosis not present

## 2014-11-06 DIAGNOSIS — J45909 Unspecified asthma, uncomplicated: Secondary | ICD-10-CM | POA: Diagnosis not present

## 2014-11-06 DIAGNOSIS — M069 Rheumatoid arthritis, unspecified: Secondary | ICD-10-CM | POA: Diagnosis not present

## 2014-11-06 LAB — GLUCOSE, CAPILLARY: Glucose-Capillary: 112 mg/dL — ABNORMAL HIGH (ref 70–99)

## 2014-11-06 MED ORDER — PREDNISONE 5 MG PO TABS: ORAL_TABLET | ORAL | Status: DC

## 2014-11-06 MED ORDER — DOXYCYCLINE HYCLATE 100 MG PO TABS: 100.0000 mg | ORAL_TABLET | Freq: Two times a day (BID) | ORAL | Status: AC

## 2014-11-06 MED ORDER — LORAZEPAM 0.5 MG PO TABS: ORAL_TABLET | ORAL | Status: DC

## 2014-11-06 MED ORDER — HYDROCODONE-ACETAMINOPHEN 5-325 MG PO TABS: 1.0000 | ORAL_TABLET | Freq: Four times a day (QID) | ORAL | Status: DC | PRN

## 2014-11-06 MED ORDER — ESOMEPRAZOLE MAGNESIUM 40 MG PO CPDR: 40.0000 mg | DELAYED_RELEASE_CAPSULE | Freq: Two times a day (BID) | ORAL | Status: DC

## 2014-11-06 NOTE — Discharge Summary (Signed)
[name], is a 81 y.o. female  DOB [birth year]  MRN [id number].  Admission date:  10/29/2014  Admitting Physician  Robbie Lis, MD  Discharge Date:  11/06/2014   Primary MD  [name], MD  Recommendations for primary care physician for things to follow:   Repeat CBC, BMP two-view chest x-ray in a week. Follow with primary pulmonologist Dr. Baird Lyons in a week   Admission Diagnosis  Sepsis   Discharge Diagnosis  Sepsis    Principal Problem:   Hypoxia Active Problems:   RA (rheumatoid arthritis)   Coronary atherosclerosis of native coronary artery   Essential hypertension   Sepsis   Elevated troponin      Past Medical History  Diagnosis Date  . SOB (shortness of breath)   . MI (myocardial infarction)   . Poor appetite   . Arthritis   . Fatigue   . Right ear pain   . Renal insufficiency   . RA (rheumatoid arthritis)   . PUD (peptic ulcer disease)   . GI bleed 2005  . GERD (gastroesophageal reflux disease)   . Hiatal hernia   . Hypothyroidism   . Osteoporosis   . Depression   . Anxiety   . Coronary artery disease   . CVA (cerebral vascular accident)   . TIA (transient ischemic attack)   . Fall   . DVT of lower extremity (deep venous thrombosis)   . HCAP (healthcare-associated pneumonia) 01/22/2013  . Enteritis due to Clostridium difficile 01/04/2013  . Sepsis 12/19/2012  . Recurrent colitis due to Clostridium difficile 06/02/2012  . Hypertension   . Hyperlipidemia   . Fatigue   . Weight loss   . Hemorrhoids   . Malnutrition   . Dyspnea     Past Surgical History  Procedure Laterality Date  . Nstemi  06/2010  . Spinal fusion surgery    . Knee surgery    . Cardiac catheterization      SHOWED RUPTURE PLAQUE IN THE LAD. THE LAD IS NONOBSTRUCTIVE WITH ONLY 30-40% STENOSIS  .  Back surgery    . Breast surgery  1964    x3  . Ankle surgery    . Colonoscopy         History of present illness and  Hospital Course:     Kindly see H&P for history of present illness and admission details, please review complete Labs, Consult reports and Test reports for all details in brief  HPI  from the history and physical done on the day of admission  81 year old female with past medical history of remote CVA, hypertension, diastolic CHF, dyslipidemia who presented from [facility] with worsening shortness of breath, cough, fever and chills for past few days piror to this admission. Shortness of breath is present at rest and it is worse with movement. Pt reported dry and on occasion productive cough. She has associated chest discomfort with coughing. No palpitations. No reports of lightheadedness, falls. No abdominal pain, nausea or vomiting. No reports of  blood in stool or urine. No reports of diarrhea or constipation. On EMS arrival she was given albuterol, Atrovent, solumedrol.   In ED, BP was 111/54, HR 110-149, RR 19-41, T max 101.52F and oxygen saturation 86% on room air. She required BiPAP to keep O2 sats above 90%. Blood work showed WBC count 14.1, platelets 110, creatinine 1.19, troponin 0.15, the 12 lead EKG sinus tachycardia. CXR showed cardiomegaly and mild edema. She was given nebulizers in ED, lasix 40 mg IV once, cefepime and vanco. She was admitted to SDU since she is on BiPAP.   Hospital Course   Acute respiratory failure with hypoxia / Healthcare associated pneumonia along with acute on chronic diastolic CHF - She required BiPAP. CT scan and repeat chest x-ray done on 11/02/2014 shows questionable infiltrate versus atelectasis. She had impressive leukocytosis, acute hypoxic respiratory failure, productive cough and elevated lactate on the day of admission. All pointing towards infectious etiology.  - She has been treated with adequate HCAP antibiotics, antibiotics  were tapered to oral Doxy on 11/03/2014. He is now almost completely symptom free and off oxygen, she had evidence of fluid overload as well for which she has been diuresed with Lasix 2 doses. She appears euvolemic now.  - Request SNF staff to continue supportive care with flutter valve, pulmonary hygiene, along with Levaquin recliner as much as possible and daytime.   - She had some wheezing despite adequate diuresis, a trial of Solu-Medrol was started on 11/04/2014 with much improvement in symptoms and wheezing. Question undiagnosed asthma versus COPD due to secondhand smoking. We'll place her on a steroid taper of note she takes 10 mg of prednisone daily at home taper will stop at 10 mg daily thereafter per PCP and primary pulmonologist. Burnis Medin request outpatient pulmonary follow-up post discharge. He was seen here by pulmonary as well. Discharge to SNF today. Currently 96% on room air. She is almost completely symptom free.    Severe sepsis due to pneumonia / Leukocytosis  - Resolved after supportive care as in #1 above.   Acute on chronic renal failure stage III. Baseline creatinine close to 1.5 to 1.7 owing back 1-1/2 years - Close to baseline monitor.   Elevated troponin - Troponin rise and non-ACS pattern secondary to demand ischemia from pneumonia and sepsis. EKG nonacute, echogram with a preserved EF of 60% without any wall motion abnormality. Seen by Cards. Continue beta blocker and statin. Resume 81 mg of aspirin with increased PPI dose twice a day. Outpatient follow-up with cardiology postdischarge.    1 Episode of coffee-ground emesis - This was most likely related to gastritis, esophagitis, versus Mallory-Weiss tear. Aspirin was held, she was seen by GI, H&H remained stable. No further workup was done here in the inpatient setting. No further episodes of nausea vomiting. Her aspirin will be resumed at 81 mg daily however her PPI will be doubled to 40 twice a day from 20 twice a  day. We will request SNF staff to monitor H&H and arrange for one-time outpatient GI follow-up with Dr. Oletta Lamas in 1-2 weeks.    Acute on chronic diastolic CHF EF 43% resolved after 2 doses of IV Lasix on 5/4 and 11/02/2014, request 2 g sodium per day heart healthy diet along with 1-1/2 L fluid restriction on a daily basis.   Essential hypertension - Continue metoprolol at present dose. Monitor blood pressure and adjust meds as needed.   Dyslipidemia - Continue Crestor    Iron deficiency anemia anemia of chronic  kidney disease - Continue ferrous sulfate supplementation, Hemoglobin stable   Hypothyroidism - Continue synthroid    Hypo-kalemia. Has been replaced and a repeat BMP in 5-7 days at [facility].     Discharge Condition: Stable   Follow UP  Follow-up Information    Follow up with [name], MD. Schedule an appointment as soon as possible for a visit in 1 week.   Specialty:  Internal Medicine   Contact information:   [place] [address] [phone]       Follow up with Deneise Lever, MD. Schedule an appointment as soon as possible for a visit in 1 week.   Specialty:  Pulmonary Disease   Why:  Reactive airway versus COPD   Contact information:   [place] [address] [phone]       Follow up with EDWARDS JR,JAMES L, MD. Schedule an appointment as soon as possible for a visit in 1 week.   Specialty:  Gastroenterology   Why:  GI bleed   Contact information:   [address] [place] [address] [phone]       Follow up with Minus Breeding, MD. Schedule an appointment as soon as possible for a visit in 1 week.   Specialty:  Cardiology   Why:  CHF   Contact information:   [place] [address] [phone]         Discharge Instructions  and  Discharge Medications          Discharge Instructions    Diet - low sodium heart healthy    Complete by:  As directed       Discharge instructions    Complete by:  As directed   Follow with Primary MD [name], MD in 7 days   Get CBC, CMP, 2 view Chest X ray checked  by Primary MD next visit.    Activity: As tolerated with Full fall precautions use walker/cane & assistance as needed   Disposition SNF   Diet: Heart Healthy  with feeding assistance and aspiration precautions.  For Heart failure patients - Check your Weight same time everyday, if you gain over 2 pounds, or you develop in leg swelling, experience more shortness of breath or chest pain, call your Primary MD immediately. Follow Cardiac Low Salt Diet and 1.5 lit/day fluid restriction.   On your next visit with your primary care physician please Get Medicines reviewed and adjusted.   Please request your Prim.MD to go over all Hospital Tests and Procedure/Radiological results at the follow up, please get all Hospital records sent to your Prim MD by signing hospital release before you go home.   If you experience worsening of your admission symptoms, develop shortness of breath, life threatening emergency, suicidal or homicidal thoughts you must seek medical attention immediately by calling 911 or calling your MD immediately  if symptoms less severe.  You Must read complete instructions/literature along with all the possible adverse reactions/side effects for all the Medicines you take and that have been prescribed to you. Take any new Medicines after you have completely understood and accpet all the possible adverse reactions/side effects.   Do not drive, operating heavy machinery, perform activities at heights, swimming or participation in water activities or provide baby sitting services if your were admitted for syncope or siezures until you have seen by Primary MD or a Neurologist and advised to do so again.  Do not drive when  taking Pain medications.    Do not take more than prescribed Pain, Sleep and Anxiety Medications  Special  Instructions: If you have smoked or chewed Tobacco  in the last 2 yrs please stop smoking, stop any regular Alcohol  and or any Recreational drug use.  Wear Seat belts while driving.   Please note  You were cared for by a hospitalist during your hospital stay. If you have any questions about your discharge medications or the care you received while you were in the hospital after you are discharged, you can call the unit and asked to speak with the hospitalist on call if the hospitalist that took care of you is not available. Once you are discharged, your primary care physician will handle any further medical issues. Please note that NO REFILLS for any discharge medications will be authorized once you are discharged, as it is imperative that you return to your primary care physician (or establish a relationship with a primary care physician if you do not have one) for your aftercare needs so that they can reassess your need for medications and monitor your lab values.     Increase activity slowly    Complete by:  As directed             Medication List    TAKE these medications        albuterol 108 (90 BASE) MCG/ACT inhaler  Commonly known as:  PROVENTIL HFA;VENTOLIN HFA  Inhale 1 puff into the lungs every 6 (six) hours as needed for wheezing or shortness of breath.     aspirin 81 MG chewable tablet  Chew 81 mg by mouth daily.     calcium-vitamin D 500-200 MG-UNIT per tablet  Commonly known as:  OSCAL WITH D  Take 1 tablet by mouth 3 (three) times daily.     cetirizine 10 MG tablet  Commonly known as:  ZYRTEC  Take 10 mg by mouth daily.     Cranberry 200 MG Caps  Take 2 capsules by mouth 2 (two) times daily.     dextromethorphan-guaiFENesin 30-600 MG per 12 hr tablet  Commonly known as:  MUCINEX DM  Take 1 tablet by mouth 2 (two) times daily as needed for cough.     doxycycline 100 MG tablet  Commonly known as:  VIBRA-TABS  Take 1 tablet (100 mg total) by mouth every 12  (twelve) hours. For 3 more days     esomeprazole 40 MG capsule  Commonly known as:  NEXIUM  Take 1 capsule (40 mg total) by mouth 2 (two) times daily before a meal.     ferrous sulfate 325 (65 FE) MG tablet  Take 325 mg by mouth daily with breakfast.     Fish Oil 1200 MG Caps  Take 1,200 mg by mouth daily.     HYDROcodone-acetaminophen 5-325 MG per tablet  Commonly known as:  NORCO/VICODIN  Take 1 tablet by mouth every 6 (six) hours as needed for severe pain. Take one tablet by mouth every 4 to 6 hours as needed for pain. Do not exceed 4gm of Tylenol in 24 hours     ipratropium 0.03 % nasal spray  Commonly known as:  ATROVENT  Place 2 sprays into both nostrils every 12 (twelve) hours.     ipratropium-albuterol 0.5-2.5 (3) MG/3ML Soln  Commonly known as:  DUONEB  Inhale 3 mLs into the lungs every 6 (six) hours as needed (wheezing/shortness of breath). Currenlty running, Every 6 hours for 3 days.  Course to be completed on 10/28/14- 10/30/14     levothyroxine 50 MCG tablet  Commonly known as:  SYNTHROID, LEVOTHROID  Take 50 mcg by mouth daily.     Lidocaine (Anorectal) 5 % Crea  Apply 1 application topically 4 (four) times daily as needed.     LORazepam 0.5 MG tablet  Commonly known as:  ATIVAN  Take one tablet by mouth every night at bedtime for anxiety     Menthol (Topical Analgesic) 4 % Gel  Apply 1 application topically 3 (three) times daily as needed (may leave at bedside).     methocarbamol 500 MG tablet  Commonly known as:  ROBAXIN  Take 500 mg by mouth at bedtime.     metoprolol tartrate 25 MG tablet  Commonly known as:  LOPRESSOR  Take 25 mg by mouth 2 (two) times daily.     montelukast 10 MG tablet  Commonly known as:  SINGULAIR  Take 10 mg by mouth at bedtime.     multivitamin tablet  Take 1 tablet by mouth daily.     predniSONE 5 MG tablet  Commonly known as:  DELTASONE  Label  & dispense according to the schedule below. 10 Pills PO for 3 days then, 8  Pills PO for 3 days, 6 Pills PO for 3 days, 4 Pills PO for 3 days, then continue taking 2 pills daily as before     promethazine 25 MG tablet  Commonly known as:  PHENERGAN  Take 25 mg by mouth every 6 (six) hours as needed for nausea or vomiting.     rosuvastatin 20 MG tablet  Commonly known as:  CRESTOR  Take 20 mg by mouth every evening.     saccharomyces boulardii 250 MG capsule  Commonly known as:  FLORASTOR  Take 250 mg by mouth 2 (two) times daily.     sertraline 50 MG tablet  Commonly known as:  ZOLOFT  Take 50 mg by mouth every morning.     SYSTANE 0.4-0.3 % Soln  Generic drug:  Polyethyl Glycol-Propyl Glycol  Apply 1 drop to eye 2 (two) times daily.     Vitamin D 2000 UNITS Caps  Take 1 capsule (2,000 Units total) by mouth daily.          Diet and Activity recommendation: See Discharge Instructions above   Consults obtained - PCCM, Cards, GI   Major procedures and Radiology Reports - PLEASE review detailed and final reports for all details, in brief -       Ct Chest Wo Contrast  10/30/2014   CLINICAL DATA:  Nonproductive cough, fever, sore throat, history coronary disease post MI, GERD, stroke, hypertension, rheumatoid arthritis  EXAM: CT CHEST WITHOUT CONTRAST  TECHNIQUE: Multidetector CT imaging of the chest was performed following the standard protocol without IV contrast. Sagittal and coronal MPR images reconstructed from axial data set. Images were repeated due to coughing.  COMPARISON:  05/12/2011  FINDINGS: Scattered atherosclerotic calcifications aorta and coronary arteries.  Large hiatal hernia.  Small pericardial effusion.  Questionable wall thickening of thoracic esophagus versus artifact from underdistention.  No thoracic adenopathy.  BILATERAL renal cortical atrophy at their upper poles.  Dependent atelectasis in both lungs.  Atelectasis versus focal infiltrate in posterior aspect of RIGHT upper lobe superiorly, new.  Central peribronchial thickening.   Remaining lungs clear.  No pleural effusion or pneumothorax.  Bones diffusely demineralized with evidence of prior thoracic spinal fixation and scattered thoracic compression deformities.  IMPRESSION: Bronchitic changes with  dependent atelectasis in both lower lobes.  Atelectasis versus infiltrate in posterior aspect of RIGHT upper lobe   Electronically Signed   By: Lavonia Dana M.D.   On: 10/30/2014 14:37   Dg Chest Port 1 View  11/02/2014   CLINICAL DATA:  Community-acquired pneumonia  EXAM: PORTABLE CHEST - 1 VIEW  COMPARISON:  11/01/2014  FINDINGS: Bibasilar airspace disease left greater than right is unchanged. Right upper lobe airspace disease also unchanged  Decreased lung volume with bibasilar atelectasis. Negative for edema or effusion  IMPRESSION: Bilateral infiltrates unchanged.  Possible pneumonia.   Electronically Signed   By: Franchot Gallo M.D.   On: 11/02/2014 08:52   Dg Chest Port 1 View  11/01/2014   CLINICAL DATA:  Shortness of breath, wheezing, congestion. Community acquired pneumonia.  EXAM: PORTABLE CHEST - 1 VIEW  COMPARISON:  10/29/2014  FINDINGS: There is a large hiatal hernia. Very low lung volumes with bibasilar opacities, worsening since prior study. Increasing right upper lobe airspace opacity. No Conference that no effusions or acute bony abnormality. Posterior spinal rods in place.  IMPRESSION: Low lung volumes, worsening since prior study. Increasing right upper lobe and bibasilar airspace opacities. Findings concerning for multifocal pneumonia.   Electronically Signed   By: Rolm Baptise M.D.   On: 11/01/2014 12:16   Dg Chest Port 1 View  10/29/2014   CLINICAL DATA:  Respiratory distress.  Cough.  EXAM: PORTABLE CHEST - 1 VIEW  COMPARISON:  01/23/2013  FINDINGS: The heart is enlarged. Large hiatal hernia is again noted. There is prominence of interstitial markings consistent with interstitial edema.  IMPRESSION: 1. Cardiomegaly and mild edema. 2. Large hiatal hernia.    Electronically Signed   By: Nolon Nations M.D.   On: 10/29/2014 10:51    Micro Results      Recent Results (from the past 240 hour(s))  Culture, blood (x 2)     Status: None   Collection Time: 10/29/14  1:23 PM  Result Value Ref Range Status   Specimen Description BLOOD RIGHT ANTECUBITAL  Final   Special Requests BOTTLES DRAWN AEROBIC AND ANAEROBIC 5CC EACH  Final   Culture   Final    NO GROWTH 5 DAYS Performed at [facility]    Report Status 11/04/2014 FINAL  Final  Culture, blood (x 2)     Status: None   Collection Time: 10/29/14  1:40 PM  Result Value Ref Range Status   Specimen Description BLOOD RIGHT WRIST  Final   Special Requests BOTTLES DRAWN AEROBIC AND ANAEROBIC 5CC EACH  Final   Culture   Final    NO GROWTH 5 DAYS Performed at [facility]    Report Status 11/04/2014 FINAL  Final  MRSA PCR Screening     Status: None   Collection Time: 10/29/14  3:07 PM  Result Value Ref Range Status   MRSA by PCR NEGATIVE NEGATIVE Final    Comment:        The GeneXpert MRSA Assay (FDA approved for NASAL specimens only), is one component of a comprehensive MRSA colonization surveillance program. It is not intended to diagnose MRSA infection nor to guide or monitor treatment for MRSA infections.        Today   Subjective:   [name] today has no headache,no chest abdominal pain,no new weakness tingling or numbness, feels much better .  Objective:   Blood pressure 142/81, pulse 92, temperature 98.2 F (36.8 C), temperature source Oral, resp. rate 18, height  5\' 6"  (1.676 m), weight 71.1 kg (156 lb 12 oz), SpO2 96 %.   Intake/Output Summary (Last 24 hours) at 11/06/14 0842 Last data filed at 11/06/14 0700  Gross per 24 hour  Intake    720 ml  Output    250 ml  Net    470 ml    Exam Awake Alert, Oriented x 3, No new F.N deficits, Normal affect Tina.AT,PERRAL Supple Neck,No JVD, No cervical lymphadenopathy appriciated.  Symmetrical  Chest wall movement, Good air movement bilaterally, CTAB RRR,No Gallops,Rubs or new Murmurs, No Parasternal Heave +ve B.Sounds, Abd Soft, Non tender, No organomegaly appriciated, No rebound -guarding or rigidity. No Cyanosis, Clubbing or edema, No new Rash or bruise  Data Review   CBC w Diff:  Lab Results  Component Value Date   WBC 11.7* 11/03/2014   WBC 12.2 07/13/2014   WBC 12.0* 09/01/2013   HGB 9.5* 11/03/2014   HGB 11.4* 09/01/2013   HCT 29.6* 11/03/2014   HCT 33.2* 09/01/2013   PLT 109* 11/03/2014   PLT 150 09/01/2013   LYMPHOPCT 11* 10/29/2014   LYMPHOPCT 18.6 09/01/2013   MONOPCT 7 10/29/2014   MONOPCT 9.4 09/01/2013   EOSPCT 1 10/29/2014   EOSPCT 1.4 09/01/2013   BASOPCT 0 10/29/2014   BASOPCT 0.8 09/01/2013    CMP:  Lab Results  Component Value Date   NA 141 11/04/2014   NA 141 07/13/2014   NA 141 01/22/2013   K 4.5 11/05/2014   K 3.6 01/22/2013   CL 107 11/04/2014   CL 106 01/22/2013   CO2 25 11/04/2014   CO2 23 01/22/2013   BUN 27* 11/04/2014   BUN 34* 07/13/2014   BUN 26* 01/22/2013   CREATININE 1.72* 11/04/2014   CREATININE 1.5* 07/13/2014   CREATININE 1.65* 01/22/2013   GLU 81 07/13/2014   PROT 6.7 10/30/2014   ALBUMIN 3.3* 10/30/2014   BILITOT 0.9 10/30/2014   ALKPHOS 48 10/30/2014   AST 41 10/30/2014   ALT 25 10/30/2014  .   Total Time in preparing paper work, data evaluation and todays exam - 35 minutes  Thurnell Lose M.D on 11/06/2014 at 8:42 AM  [facility]   Office  [phone]

## 2014-11-06 NOTE — Progress Notes (Signed)
SLP Cancellation Note  Patient Details Name: Shariyah Eland MRN: [id number] DOB: [birth year]   Cancelled treatment:       Reason Eval/Treat Not Completed: Other (comment) (pt to dc to SNF today)   Luanna Salk, [place] SLP [phone]

## 2014-11-06 NOTE — Progress Notes (Signed)
BRIEF NUTRITION NOTE  Pt to be seen for follow-up today. Discharge summary in earlier this AM. If pt unable to d/c today for any reason, will see for full follow-up tomorrow (5/10).   Jarome Matin, RD, LDN Inpatient Clinical Dietitian Pager # [phone] After hours/weekend pager # [phone]

## 2014-11-06 NOTE — Progress Notes (Signed)
Patient is set to discharge back to [facility] today. Patient aware, states that her family knows but CSW left voicemail for patient's son, Shanquita Ronning (ph#: [phone]). Discharge packet given to RN, Gregary Signs. [facility] called for transport.     Raynaldo Opitz, [facility] Clinical Social Worker cell #: [phone]

## 2014-11-06 NOTE — Discharge Instructions (Signed)
Follow with Primary MD [name], MD in 7 days   Get CBC, CMP, 2 view Chest X ray checked  by Primary MD next visit.    Activity: As tolerated with Full fall precautions use walker/cane & assistance as needed   Disposition SNF   Diet: Heart Healthy  with feeding assistance and aspiration precautions.  For Heart failure patients - Check your Weight same time everyday, if you gain over 2 pounds, or you develop in leg swelling, experience more shortness of breath or chest pain, call your Primary MD immediately. Follow Cardiac Low Salt Diet and 1.5 lit/day fluid restriction.   On your next visit with your primary care physician please Get Medicines reviewed and adjusted.   Please request your Prim.MD to go over all Hospital Tests and Procedure/Radiological results at the follow up, please get all Hospital records sent to your Prim MD by signing hospital release before you go home.   If you experience worsening of your admission symptoms, develop shortness of breath, life threatening emergency, suicidal or homicidal thoughts you must seek medical attention immediately by calling 911 or calling your MD immediately  if symptoms less severe.  You Must read complete instructions/literature along with all the possible adverse reactions/side effects for all the Medicines you take and that have been prescribed to you. Take any new Medicines after you have completely understood and accpet all the possible adverse reactions/side effects.   Do not drive, operating heavy machinery, perform activities at heights, swimming or participation in water activities or provide baby sitting services if your were admitted for syncope or siezures until you have seen by Primary MD or a Neurologist and advised to do so again.  Do not drive when taking Pain medications.    Do not take more than prescribed Pain, Sleep and Anxiety Medications  Special Instructions: If you have smoked or chewed Tobacco  in the  last 2 yrs please stop smoking, stop any regular Alcohol  and or any Recreational drug use.  Wear Seat belts while driving.   Please note  You were cared for by a hospitalist during your hospital stay. If you have any questions about your discharge medications or the care you received while you were in the hospital after you are discharged, you can call the unit and asked to speak with the hospitalist on call if the hospitalist that took care of you is not available. Once you are discharged, your primary care physician will handle any further medical issues. Please note that NO REFILLS for any discharge medications will be authorized once you are discharged, as it is imperative that you return to your primary care physician (or establish a relationship with a primary care physician if you do not have one) for your aftercare needs so that they can reassess your need for medications and monitor your lab values.

## 2014-11-07 ENCOUNTER — Encounter: Payer: Self-pay | Admitting: Registered Nurse

## 2014-11-07 ENCOUNTER — Non-Acute Institutional Stay (SKILLED_NURSING_FACILITY): Payer: Medicare Other | Admitting: Registered Nurse

## 2014-11-07 DIAGNOSIS — K2100 Gastro-esophageal reflux disease with esophagitis, without bleeding: Secondary | ICD-10-CM

## 2014-11-07 DIAGNOSIS — R5381 Other malaise: Secondary | ICD-10-CM

## 2014-11-07 DIAGNOSIS — E039 Hypothyroidism, unspecified: Secondary | ICD-10-CM

## 2014-11-07 DIAGNOSIS — N183 Chronic kidney disease, stage 3 unspecified: Secondary | ICD-10-CM

## 2014-11-07 DIAGNOSIS — M069 Rheumatoid arthritis, unspecified: Secondary | ICD-10-CM | POA: Diagnosis not present

## 2014-11-07 DIAGNOSIS — J9601 Acute respiratory failure with hypoxia: Secondary | ICD-10-CM

## 2014-11-07 DIAGNOSIS — R630 Anorexia: Secondary | ICD-10-CM | POA: Diagnosis not present

## 2014-11-07 DIAGNOSIS — A419 Sepsis, unspecified organism: Secondary | ICD-10-CM | POA: Diagnosis not present

## 2014-11-07 DIAGNOSIS — D509 Iron deficiency anemia, unspecified: Secondary | ICD-10-CM | POA: Diagnosis not present

## 2014-11-07 DIAGNOSIS — E2740 Unspecified adrenocortical insufficiency: Secondary | ICD-10-CM

## 2014-11-07 DIAGNOSIS — I5033 Acute on chronic diastolic (congestive) heart failure: Secondary | ICD-10-CM | POA: Diagnosis not present

## 2014-11-07 DIAGNOSIS — F418 Other specified anxiety disorders: Secondary | ICD-10-CM | POA: Diagnosis not present

## 2014-11-07 DIAGNOSIS — I10 Essential (primary) hypertension: Secondary | ICD-10-CM

## 2014-11-07 DIAGNOSIS — E785 Hyperlipidemia, unspecified: Secondary | ICD-10-CM | POA: Diagnosis not present

## 2014-11-07 DIAGNOSIS — D72829 Elevated white blood cell count, unspecified: Secondary | ICD-10-CM

## 2014-11-07 NOTE — Progress Notes (Signed)
Patient ID: [name], female   DOB: 05-31-1930, 81 y.o.   MRN: [id number]   Place of Service: [facility]  Allergies  Allergen Reactions  . Biphosphate   . Morphine And Related Other (See Comments)    unknown  . Percocet [Oxycodone-Acetaminophen]     unknown  . Plavix [Clopidogrel Bisulfate] Other (See Comments)    unknown  . Sulfur Other (See Comments)    unknown    Code Status: Full Code  Goals of Care: Longevity/LTC  Chief Complaint  Patient presents with  . Hospitalization Follow-up    HPI Review of hospital record showed 81 y.o. female long term resident with PMH of RA, osteoporosis, adrenal insufficiency, GERD, depression, HTN, AR, hypothyroidism among others is being seen for a hospitalization follow up post hospital admission from 10/29/14 to 11/06/14 with acute respiratory failure with hypoxia and sepsis in the setting of HCAP along with acute on chronic diastolic heart failure. She required BiPAP and was treated with adequated HCAP antibiotics-discharged with doxycycline x 3 more days and prednisone taper. Outpatient pulmonary follow-up was recommended for questionable asthma versus COPD due to second hand smoking. Had one episode of coffee-ground emesis during recent hospitalization-thought to be r/t to gastritis or esophagitis. Aspirin was held. She was seen by GI with NO further inpatient work up. Aspirin was resumed and PPI dosage was increased. Out patient GI follow-up with Dr. Oletta Lamas was recommended. Seen in room today. Reported having intermittent cough-would like to have prescription cough syrup. Stated that Tessalon and Robitussin not helpful. Also would like to have a breathing treatment now. No other concerns reported.   Review of Systems Constitutional: Negative for fever and chills.  HENT: Negative for ear pain, congestion, and sore throat Eyes: Negative for eye pain, eye discharge, and visual disturbance  Cardiovascular: Negative for chest pain,  palpitations, and leg swelling Respiratory: Positive for productive cough and shortness of breath. Negative for wheezing Gastrointestinal: Negative for nausea and vomiting. Negative for abdominal pain, diarrhea and constipation.  Genitourinary: Negative for dysuria and hematuria Endocrine: Negative for polydipsia, polyphagia, and polyuria Musculoskeletal: Negative for uncontrolled pain Neurological: Negative for dizziness and headache  Skin: Negative for rash and wound.   Psychiatric: Negative for depression.   Past Medical History  Diagnosis Date  . SOB (shortness of breath)   . MI (myocardial infarction)   . Poor appetite   . Arthritis   . Fatigue   . Right ear pain   . Renal insufficiency   . RA (rheumatoid arthritis)   . PUD (peptic ulcer disease)   . GI bleed 2005  . GERD (gastroesophageal reflux disease)   . Hiatal hernia   . Hypothyroidism   . Osteoporosis   . Depression   . Anxiety   . Coronary artery disease   . CVA (cerebral vascular accident)   . TIA (transient ischemic attack)   . Fall   . DVT of lower extremity (deep venous thrombosis)   . HCAP (healthcare-associated pneumonia) 01/22/2013  . Enteritis due to Clostridium difficile 01/04/2013  . Sepsis 12/19/2012  . Recurrent colitis due to Clostridium difficile 06/02/2012  . Hypertension   . Hyperlipidemia   . Fatigue   . Weight loss   . Hemorrhoids   . Malnutrition   . Dyspnea     Past Surgical History  Procedure Laterality Date  . Nstemi  06/2010  . Spinal fusion surgery    . Knee surgery    . Cardiac catheterization  SHOWED RUPTURE PLAQUE IN THE LAD. THE LAD IS NONOBSTRUCTIVE WITH ONLY 30-40% STENOSIS  . Back surgery    . Breast surgery  1964    x3  . Ankle surgery    . Colonoscopy      History   Social History  . Marital Status: Widowed    Spouse Name: N/A  . Number of Children: N/A  . Years of Education: N/A   Occupational History  . Not on file.   Social History Main Topics  .  Smoking status: Never Smoker   . Smokeless tobacco: Never Used  . Alcohol Use: No  . Drug Use: No  . Sexual Activity: No   Other Topics Concern  . Not on file   Social History Narrative   Patient used to work at [employer] for 25 years until she retired   She lives by herself in [place] until she had a stroke earlier in 2013 June   Her next of kin is her daughter   She does get physical therapy was everyday at [facility]      Caffeine use: coffee in the AM    Family History  Problem Relation Age of Onset  . Kidney disease Mother   . Kidney disease Brother   . Lung cancer Father       Medication List       This list is accurate as of: 11/07/14  7:08 PM.  Always use your most recent med list.               albuterol 108 (90 BASE) MCG/ACT inhaler  Commonly known as:  PROVENTIL HFA;VENTOLIN HFA  Inhale 1 puff into the lungs every 6 (six) hours as needed for wheezing or shortness of breath.     aspirin 81 MG chewable tablet  Chew 81 mg by mouth daily.     calcium-vitamin D 500-200 MG-UNIT per tablet  Commonly known as:  OSCAL WITH D  Take 1 tablet by mouth 3 (three) times daily.     cetirizine 10 MG tablet  Commonly known as:  ZYRTEC  Take 10 mg by mouth daily.     Cranberry 200 MG Caps  Take 2 capsules by mouth 2 (two) times daily.     dextromethorphan-guaiFENesin 30-600 MG per 12 hr tablet  Commonly known as:  MUCINEX DM  Take 1 tablet by mouth 2 (two) times daily as needed for cough.     doxycycline 100 MG tablet  Commonly known as:  VIBRA-TABS  Take 1 tablet (100 mg total) by mouth every 12 (twelve) hours. For 3 more days     esomeprazole 40 MG capsule  Commonly known as:  NEXIUM  Take 1 capsule (40 mg total) by mouth 2 (two) times daily before a meal.     ferrous sulfate 325 (65 FE) MG tablet  Take 325 mg by mouth daily with breakfast.     Fish Oil 1200 MG Caps  Take 1,200 mg by mouth daily.     HYDROcodone-acetaminophen 5-325 MG per  tablet  Commonly known as:  NORCO/VICODIN  Take 1 tablet by mouth every 6 (six) hours as needed for severe pain. Take one tablet by mouth every 4 to 6 hours as needed for pain. Do not exceed 4gm of Tylenol in 24 hours     ipratropium 0.03 % nasal spray  Commonly known as:  ATROVENT  Place 2 sprays into both nostrils every 12 (twelve) hours.     ipratropium-albuterol  0.5-2.5 (3) MG/3ML Soln  Commonly known as:  DUONEB  Inhale 3 mLs into the lungs every 6 (six) hours as needed (wheezing/shortness of breath). Currenlty running, Every 6 hours for 3 days. Course to be completed on 10/28/14- 10/30/14     levothyroxine 50 MCG tablet  Commonly known as:  SYNTHROID, LEVOTHROID  Take 50 mcg by mouth daily.     Lidocaine (Anorectal) 5 % Crea  Apply 1 application topically 4 (four) times daily as needed.     LORazepam 0.5 MG tablet  Commonly known as:  ATIVAN  Take one tablet by mouth every night at bedtime for anxiety     Menthol (Topical Analgesic) 4 % Gel  Apply 1 application topically 3 (three) times daily as needed (may leave at bedside).     methocarbamol 500 MG tablet  Commonly known as:  ROBAXIN  Take 500 mg by mouth at bedtime.     metoprolol tartrate 25 MG tablet  Commonly known as:  LOPRESSOR  Take 25 mg by mouth 2 (two) times daily.     montelukast 10 MG tablet  Commonly known as:  SINGULAIR  Take 10 mg by mouth at bedtime.     multivitamin tablet  Take 1 tablet by mouth daily.     predniSONE 5 MG tablet  Commonly known as:  DELTASONE  Label  & dispense according to the schedule below. 10 Pills PO for 3 days then, 8 Pills PO for 3 days, 6 Pills PO for 3 days, 4 Pills PO for 3 days, then continue taking 2 pills daily as before     promethazine 25 MG tablet  Commonly known as:  PHENERGAN  Take 25 mg by mouth every 6 (six) hours as needed for nausea or vomiting.     rosuvastatin 20 MG tablet  Commonly known as:  CRESTOR  Take 20 mg by mouth every evening.      saccharomyces boulardii 250 MG capsule  Commonly known as:  FLORASTOR  Take 250 mg by mouth 2 (two) times daily.     sertraline 50 MG tablet  Commonly known as:  ZOLOFT  Take 50 mg by mouth every morning.     SYSTANE 0.4-0.3 % Soln  Generic drug:  Polyethyl Glycol-Propyl Glycol  Apply 1 drop to eye 2 (two) times daily.     Vitamin D 2000 UNITS Caps  Take 1 capsule (2,000 Units total) by mouth daily.        Physical Exam  BP 130/74 mmHg  Pulse 80  Temp(Src) 98 F (36.7 C)  Resp 17  Ht 5\' 6"  (1.676 m)  Wt 152 lb (68.947 kg)  BMI 24.55 kg/m2  SpO2 92%  LMP  (LMP Unknown)  Constitutional: Thin/frail elderly female in no acute distress. Conversant and pleasant HEENT: Normocephalic and atraumatic. PERRL. EOM intact. No icterus. Oral mucosa moist. Posterior pharynx clear of any exudate or lesions.  Neck: Supple and nontender. No lymphadenopathy. No  masses or thyromegaly noted. No JVD or carotid bruits. Cardiac: Normal S1, S2. RRR without appreciable murmurs, rubs, or gallops. Distal pulses intact. Trace pitting edema of BLE Lungs: Unlabored respirations. Breath sounds diminished bilaterally without rales, rhonchi, or wheezes. Abdomen: Audible bowel sounds in all quadrants. Soft, nontender, nondistended.  Musculoskeletal: able to move extremities with generalized weakness Skin: Warm and dry. No rash noted. BUE with bruises  Neurological: Alert and oriented to person, place, and time.  Psychiatric: Judgment and insight adequate. Appropriate mood and affect.   Labs Reviewed  CBC Latest Ref Rng 11/03/2014 11/02/2014 11/02/2014  WBC 4.0 - 10.5 K/uL 11.7(H) 12.0(H) -  Hemoglobin 12.0 - 15.0 g/dL 9.5(L) 10.1(L) 9.5(L)  Hematocrit 36.0 - 46.0 % 29.6(L) 31.5(L) 29.5(L)  Platelets 150 - 400 K/uL 109(L) 107(L) -    CMP Latest Ref Rng 11/05/2014 11/04/2014 11/03/2014  Glucose 70 - 99 mg/dL - 123(H) 100(H)  BUN 6 - 20 mg/dL - 27(H) 28(H)  Creatinine 0.44 - 1.00 mg/dL - 1.72(H) 1.70(H)  Sodium  135 - 145 mmol/L - 141 136  Potassium 3.5 - 5.1 mmol/L 4.5 3.4(L) 3.5  Chloride 101 - 111 mmol/L - 107 106  CO2 22 - 32 mmol/L - 25 23  Calcium 8.9 - 10.3 mg/dL - 8.4(L) 7.9(L)  Total Protein 6.5 - 8.1 g/dL - - -  Total Bilirubin 0.3 - 1.2 mg/dL - - -  Alkaline Phos 38 - 126 U/L - - -  AST 15 - 41 U/L - - -  ALT 14 - 54 U/L - - -    Lab Results  Component Value Date   TSH 1.03 07/06/2014    Lipid Panel     Component Value Date/Time   CHOL 161 07/13/2014   TRIG 252* 07/13/2014   HDL 52 07/13/2014   CHOLHDL 2.9 12/02/2011 0655   VLDL 56* 12/02/2011 0655   LDLCALC 59 07/13/2014   LDLDIRECT 60.7 11/13/2010 1130   Diagnostic Studies Reviewed  10/30/14: 2D Echo - Left ventricle: The cavity size was normal. Wall thickness was increased in a pattern of mild LVH. Systolic function was normal. The estimated ejection fraction was in the range of 60% to 65%. There was dynamic obstruction at rest, with a peak velocity of 271 cm/sec and a peak gradient of 29 mm Hg. There was dynamic obstruction. Wall motion was normal; there were no regional wallmotion abnormalities. Doppler parameters are consistent with abnormal left ventricular relaxation (grade 1 diastolic dysfunction). - Pulmonary arteries: PA peak pressure: 47 mm Hg   10/30/14: Chest CT Bronchitic changes with dependent atelectasis in both lower lobes. Atelectasis versus infiltrate in posterior aspect of RIGHT upper Lobe  11/02/14: CXR Bilateral infiltrates unchanged. Possible pneumonia.  Assessment & Plan 1. Sepsis, due to unspecified organism In the setting HCAP. Continue doxycycline 100mg  twice daily x 3 days. Duoneb x1 now and Q6H routinely for shortness of breath and wheezing. Promethazine/codiene cough syrup every 8 hours as needed for cough (discontinue codeine allergy-only has upset stomach). Mucinex DM twice daily x 5 days then twice daily as needed for cough and congestion. Encourage using flutter valve at least Q2H while  awake. Repeat CXR in 1 week.   2. Physical deconditioning Continue to work with PT/OT for gait/strength/balance training to restore/maximize function. Fall risk precautions.   3. Acute respiratory failure with hypoxia In the setting of HCAP and acute on chronic diastolic CHF. Continue O2 as needed to maintain sat >88%. Questionable asthma versus. COPD. Continue to f/u with pulmonary.   4. Acute on chronic diastolic heart failure Appears euvolemic on exam. EF 60-65% in recent Echo. Continue low NA diet with 1-1.5L daily fluid restriction. Continue daily weight and monitor her status   5. RA (rheumatoid arthritis) Continue prednisone 10mg  daily after completion of prednisone taper with and norco 5/325mg  every six hours as needed for pain and robaxin 500mg  daily at bedtime.  6. Gastroesophageal reflux disease with esophagitis Had 1 episode of coffee ground emesis. Continue nexium 40mg  twice daily. Continue to f/u with GI  7. HLD (hyperlipidemia)  LDL 59. Continue crestor 20mg  daily.   8. Hypothyroidism, unspecified hypothyroidism type TSH normal. Continue synthroid 41mcg daily.   9. Acute on chronic CKD (chronic kidney disease) Baseline creatinine around 1.5. Last creatinine 1.72. Avoid nephrotoxic agents especially NSAIDs. Continue to monitor renal function.  10. Depression with anxiety Continue zoloft 50mg  daily with ativan 0.5mg  daily at bedtime. Monitor for change in mood  11. Decreased appetite Most likely in the setting of acute illness and recent hospitalization. Continue current diet and dietary recommendation for now. Initiate dietary consult per daughter request.   12. Anemia, iron deficiency Last hgb 9.5. Continue ferrous sulfate daily and monitor h&h   13. Essential hypertension Continue lopressor 25mg  twice daily. Monitor bp  14. Adrenal insufficiency Continue prednisone 10mg  daily after completion of prednisone taper.   15. Leukocytosis Most likely due to HCAP and  sterioid. Continue and complete abx and monitor for signs potential infection   Labs ordered: cbc, bmp in 1 week  Time: 50 minutes with >50% of total time spent on care coordination    Family/Staff Communication Plan of care discussed with resident, daughter, and nursing staff. Resident, duaghter, and nursing staff verbalized understanding and agree with plan of care. No additional questions or concerns reported.    Arthur Holms, MSN, AGNP-C [facility] [address] [place], [place] [address] [phone] [8am-5pm] After hours: [phone]

## 2014-11-09 ENCOUNTER — Non-Acute Institutional Stay (SKILLED_NURSING_FACILITY): Payer: Medicare Other | Admitting: Internal Medicine

## 2014-11-09 DIAGNOSIS — R5381 Other malaise: Secondary | ICD-10-CM

## 2014-11-09 DIAGNOSIS — K922 Gastrointestinal hemorrhage, unspecified: Secondary | ICD-10-CM

## 2014-11-09 DIAGNOSIS — I509 Heart failure, unspecified: Secondary | ICD-10-CM

## 2014-11-09 DIAGNOSIS — E2740 Unspecified adrenocortical insufficiency: Secondary | ICD-10-CM

## 2014-11-09 DIAGNOSIS — J189 Pneumonia, unspecified organism: Secondary | ICD-10-CM

## 2014-11-09 DIAGNOSIS — J9621 Acute and chronic respiratory failure with hypoxia: Secondary | ICD-10-CM

## 2014-11-09 DIAGNOSIS — M069 Rheumatoid arthritis, unspecified: Secondary | ICD-10-CM

## 2014-11-09 DIAGNOSIS — I110 Hypertensive heart disease with heart failure: Secondary | ICD-10-CM

## 2014-11-09 DIAGNOSIS — D509 Iron deficiency anemia, unspecified: Secondary | ICD-10-CM | POA: Diagnosis not present

## 2014-11-09 NOTE — Progress Notes (Signed)
Patient ID: [name], female   DOB: [birth year], 81 y.o.   MRN: [id number]     Facility: [facility]    PCP: [name], MD  Code Status: full code  Allergies  Allergen Reactions  . Biphosphate   . Morphine And Related Other (See Comments)    unknown  . Percocet [Oxycodone-Acetaminophen]     unknown  . Plavix [Clopidogrel Bisulfate] Other (See Comments)    unknown  . Sulfur Other (See Comments)    unknown    Chief Complaint  Patient presents with  . Readmit To SNF     HPI:  81 year old patient is here for long term care post hospital admission from 10/29/14 - 11/06/14 with acute respiratory failure and sepsis in the setting of HCAP and acute on chronic diastolic heart failure. She responded well to antibiotics and prednisone. She also had an episode of coffee ground emesis, aspirin was held and gi was consulted. PPI dosing was increased and outpatient follow up recommended. She has PMH of RA, osteoporosis, adrenal insufficiency, GERD, depression, HTN, AR, hypothyroidism among others. She is seen in her room today. She feels tired. Denies any other concerns. Her breathing has improved. She would like medication for her cough. No new concern from nursing staff.  Review of Systems:  Constitutional: Negative for fever, chills, diaphoresis.  HENT: Negative for headache, congestion, nasal discharge Eyes: Negative for eye pain, blurred vision, double vision and discharge.  Respiratory: Negative for shortness of breath and wheezing.  positive for cough, on o2 Cardiovascular: Negative for chest pain, palpitations, leg swelling.  Gastrointestinal: Negative for heartburn, nausea, vomiting, abdominal pain Genitourinary: Negative for dysuria  Musculoskeletal: Negative for back pain, falls Skin: Negative for itching, rash.  Neurological: Negative for dizziness, tingling, focal weakness Psychiatric/Behavioral: Negative for depression   Past Medical History   Diagnosis Date  . SOB (shortness of breath)   . MI (myocardial infarction)   . Poor appetite   . Arthritis   . Fatigue   . Right ear pain   . Renal insufficiency   . RA (rheumatoid arthritis)   . PUD (peptic ulcer disease)   . GI bleed 2005  . GERD (gastroesophageal reflux disease)   . Hiatal hernia   . Hypothyroidism   . Osteoporosis   . Depression   . Anxiety   . Coronary artery disease   . CVA (cerebral vascular accident)   . TIA (transient ischemic attack)   . Fall   . DVT of lower extremity (deep venous thrombosis)   . HCAP (healthcare-associated pneumonia) 01/22/2013  . Enteritis due to Clostridium difficile 01/04/2013  . Sepsis 12/19/2012  . Recurrent colitis due to Clostridium difficile 06/02/2012  . Hypertension   . Hyperlipidemia   . Fatigue   . Weight loss   . Hemorrhoids   . Malnutrition   . Dyspnea    Past Surgical History  Procedure Laterality Date  . Nstemi  06/2010  . Spinal fusion surgery    . Knee surgery    . Cardiac catheterization      SHOWED RUPTURE PLAQUE IN THE LAD. THE LAD IS NONOBSTRUCTIVE WITH ONLY 30-40% STENOSIS  . Back surgery    . Breast surgery  1964    x3  . Ankle surgery    . Colonoscopy     Social History:   reports that she has never smoked. She has never used smokeless tobacco. She reports that she does not drink alcohol or use illicit  drugs.  Family History  Problem Relation Age of Onset  . Kidney disease Mother   . Kidney disease Brother   . Lung cancer Father     Medications: Patient's Medications  New Prescriptions   No medications on file  Previous Medications   ALBUTEROL (PROVENTIL HFA;VENTOLIN HFA) 108 (90 BASE) MCG/ACT INHALER    Inhale 1 puff into the lungs every 6 (six) hours as needed for wheezing or shortness of breath.   ASPIRIN 81 MG CHEWABLE TABLET    Chew 81 mg by mouth daily.   CALCIUM-VITAMIN D (OSCAL WITH D) 500-200 MG-UNIT PER TABLET    Take 1 tablet by mouth 3 (three) times daily.   CETIRIZINE  (ZYRTEC) 10 MG TABLET    Take 10 mg by mouth daily.   CHOLECALCIFEROL (VITAMIN D) 2000 UNITS CAPS    Take 1 capsule (2,000 Units total) by mouth daily.   CRANBERRY 200 MG CAPS    Take 2 capsules by mouth 2 (two) times daily.   DEXTROMETHORPHAN-GUAIFENESIN (MUCINEX DM) 30-600 MG PER 12 HR TABLET    Take 1 tablet by mouth 2 (two) times daily as needed for cough.   DOXYCYCLINE (VIBRA-TABS) 100 MG TABLET    Take 1 tablet (100 mg total) by mouth every 12 (twelve) hours. For 3 more days   ESOMEPRAZOLE (NEXIUM) 40 MG CAPSULE    Take 1 capsule (40 mg total) by mouth 2 (two) times daily before a meal.   FERROUS SULFATE 325 (65 FE) MG TABLET    Take 325 mg by mouth daily with breakfast.   HYDROCODONE-ACETAMINOPHEN (NORCO/VICODIN) 5-325 MG PER TABLET    Take 1 tablet by mouth every 6 (six) hours as needed for severe pain. Take one tablet by mouth every 4 to 6 hours as needed for pain. Do not exceed 4gm of Tylenol in 24 hours   IPRATROPIUM (ATROVENT) 0.03 % NASAL SPRAY    Place 2 sprays into both nostrils every 12 (twelve) hours.   IPRATROPIUM-ALBUTEROL (DUONEB) 0.5-2.5 (3) MG/3ML SOLN    Inhale 3 mLs into the lungs every 6 (six) hours as needed (wheezing/shortness of breath). Currenlty running, Every 6 hours for 3 days. Course to be completed on 10/28/14- 10/30/14   LEVOTHYROXINE (SYNTHROID, LEVOTHROID) 50 MCG TABLET    Take 50 mcg by mouth daily.    LIDOCAINE, ANORECTAL, 5 % CREA    Apply 1 application topically 4 (four) times daily as needed.   LORAZEPAM (ATIVAN) 0.5 MG TABLET    Take one tablet by mouth every night at bedtime for anxiety   MENTHOL, TOPICAL ANALGESIC, 4 % GEL    Apply 1 application topically 3 (three) times daily as needed (may leave at bedside).   METHOCARBAMOL (ROBAXIN) 500 MG TABLET    Take 500 mg by mouth at bedtime.    METOPROLOL TARTRATE (LOPRESSOR) 25 MG TABLET    Take 25 mg by mouth 2 (two) times daily.   MONTELUKAST (SINGULAIR) 10 MG TABLET    Take 10 mg by mouth at bedtime.    MULTIPLE VITAMIN (MULTIVITAMIN) TABLET    Take 1 tablet by mouth daily.     OMEGA-3 FATTY ACIDS (FISH OIL) 1200 MG CAPS    Take 1,200 mg by mouth daily.   POLYETHYL GLYCOL-PROPYL GLYCOL (SYSTANE) 0.4-0.3 % SOLN    Apply 1 drop to eye 2 (two) times daily.   PREDNISONE (DELTASONE) 5 MG TABLET    Label  & dispense according to the schedule below. 10 Pills PO for  3 days then, 8 Pills PO for 3 days, 6 Pills PO for 3 days, 4 Pills PO for 3 days, then continue taking 2 pills daily as before   PROMETHAZINE (PHENERGAN) 25 MG TABLET    Take 25 mg by mouth every 6 (six) hours as needed for nausea or vomiting.   ROSUVASTATIN (CRESTOR) 20 MG TABLET    Take 20 mg by mouth every evening.    SACCHAROMYCES BOULARDII (FLORASTOR) 250 MG CAPSULE    Take 250 mg by mouth 2 (two) times daily.    SERTRALINE (ZOLOFT) 50 MG TABLET    Take 50 mg by mouth every morning.  Modified Medications   No medications on file  Discontinued Medications   No medications on file     Physical Exam: Filed Vitals:   11/09/14 1458  BP: 136/77  Pulse: 88  Temp: 98 F (36.7 C)  Resp: 18  SpO2: 95%    Constitutional: elderly female in no acute distress.   HEENT: normocephalic, atraumatic. No icterus or pallor Neck: Neck supple. No JVD present. Cardiovascular: Normal rate, regular rhythm. S1, S2, no murmurs, rubs, or gallops. Intact distal pulses.   Respiratory: Effort normal and breath sounds normal. No respiratory distress. She has no wheezes.  GI: Soft. Bowel sounds are normal. She exhibits no distension. There is no tenderness.  Musculoskeletal: Normal range of motion. She exhibits no edema.  Neurological: She is alert and oriented to person, place, and time.  Skin: Skin is warm and dry. She is not diaphoretic.  Psychiatric: She has a normal mood and affect.    Labs reviewed: Basic Metabolic Panel:  Recent Labs  10/29/14 1659  11/02/14 0846 11/03/14 0350 11/04/14 0519 11/05/14 0830  NA 137  < > 140 136 141  --     K 4.1  < > 3.4* 3.5 3.4* 4.5  CL 104  < > 109 106 107  --   CO2 13*  < > 24 23 25   --   GLUCOSE 352*  < > 89 100* 123*  --   BUN 21*  < > 32* 28* 27*  --   CREATININE 1.80*  < > 1.54* 1.70* 1.72*  --   CALCIUM 9.1  < > 8.0* 7.9* 8.4*  --   MG 1.9  --  1.7 2.0  --   --   PHOS 4.8*  --   --   --   --   --   < > = values in this interval not displayed. Liver Function Tests:  Recent Labs  10/29/14 1029 10/29/14 1659 10/30/14 0414  AST 39 44* 41  ALT 19 23 25   ALKPHOS 47 47 48  BILITOT 1.5* 0.8 0.9  PROT 6.3* 6.3* 6.7  ALBUMIN 3.3* 3.4* 3.3*   No results for input(s): LIPASE, AMYLASE in the last 8760 hours. No results for input(s): AMMONIA in the last 8760 hours. CBC:  Recent Labs  10/29/14 1029  11/01/14 0356 11/02/14 0340 11/02/14 0846 11/03/14 0350  WBC 14.1*  < > 9.7  --  12.0* 11.7*  NEUTROABS 11.4*  --   --   --   --   --   HGB 11.8*  < > 9.8* 9.5* 10.1* 9.5*  HCT 38.2  < > 31.3* 29.5* 31.5* 29.6*  MCV 90.5  < > 91.3  --  89.2 87.8  PLT 110*  < > 106*  --  107* 109*  < > = values in this interval not displayed. Cardiac Enzymes:  Recent Labs  10/31/14 0500 10/31/14 1056 10/31/14 1714  TROPONINI 0.56* 0.47* 0.38*   BNP: Invalid input(s): POCBNP CBG:  Recent Labs  11/04/14 1641 11/05/14 0747 11/06/14 0751  GLUCAP 210* 148* 112*   Radiological exam 10/30/14: 2D Echo - Left ventricle: The cavity size was normal. Wall thickness was increased in a pattern of mild LVH. Systolic function was normal. The estimated ejection fraction was in the range of 60% to 65%. There was dynamic obstruction at rest, with a peak velocity of 271 cm/sec and a peak gradient of 29 mm Hg. There was dynamic obstruction. Wall motion was normal; there were no regional wall motion abnormalities. Doppler parameters are consistent with abnormal left ventricular relaxation (grade 1 diastolic dysfunction). - Pulmonary arteries: PA peak pressure: 47 mm Hg   10/30/14: Chest CT Bronchitic  changes with dependent atelectasis in both lower lobes. Atelectasis versus infiltrate in posterior aspect of RIGHT upper Lobe  11/02/14: CXR Bilateral infiltrates unchanged.  Possible pneumonia.   Assessment/Plan  Physical deconditioning Will have patient work with PT/OT as tolerated to regain strength and restore function.  Fall precautions are in place.  HCAP With sepsis and acute respiraotry failure in the hospital. Completed her doxycycline. Clinically better. Add guanifesin-codeine 10 cc qid for a week (this medication has been used before and helps with her cough). D/c duoneb and mucinex. Add albuterol nebulizer q6h for a week and then q6h prn. Encourage using flutter valve. F/u cxr to assess for resolution of infiltrates  Chronic respiratory failure With recent acute exacerbation, continue o2. Has hx of chf and recent HCAP. Has follow up with pulmonary for further chronic lung disease workup  Gi bleed With hematemesis. Off aspirin now. Continue nexium 40 mg bid and has f.u with GI. Check cbc  Iron deficiency anemia Continue ferrous uslfate, recent gi bleed, hb 9.5, check h&h  chronic diastolic heart failure Appears euvolemic on exam. EF 60-65% in recent Echo. Monitor her weight. Continue lopressor 25 mg bid for now.   Adrenal insufficiency Continue her prednisone 10 mg daily for now and monitro  RA Continue chronic 10 mg daily prednisone with prn norco for pain, no changes made   Goals of care: short term rehabilitation   Labs/tests ordered: cbc, bmp  Family/ staff Communication: reviewed care plan with patient and nursing supervisor    [name], MD  [facility] [phone] (Monday-Friday 8 am - 5 pm) [phone] (afterhours)

## 2014-11-10 ENCOUNTER — Other Ambulatory Visit: Payer: Self-pay | Admitting: *Deleted

## 2014-11-14 ENCOUNTER — Inpatient Hospital Stay: Payer: Medicare Other | Admitting: Adult Health

## 2014-11-23 ENCOUNTER — Ambulatory Visit (INDEPENDENT_AMBULATORY_CARE_PROVIDER_SITE_OTHER): Payer: Medicare Other | Admitting: Adult Health

## 2014-11-23 ENCOUNTER — Encounter: Payer: Self-pay | Admitting: Adult Health

## 2014-11-23 ENCOUNTER — Ambulatory Visit: Payer: Medicare Other | Admitting: Cardiology

## 2014-11-23 VITALS — BP 126/64 | HR 90 | Temp 98.4°F

## 2014-11-23 DIAGNOSIS — R0902 Hypoxemia: Secondary | ICD-10-CM

## 2014-11-23 DIAGNOSIS — J189 Pneumonia, unspecified organism: Secondary | ICD-10-CM

## 2014-11-23 NOTE — Patient Instructions (Addendum)
Continue on current regimen .  Wear Oxygen 2l/m As needed  And At bedtime   Chest xray will be ordered.  follow up Dr. Annamaria Boots  In 6 weeks and As needed

## 2014-11-23 NOTE — Progress Notes (Signed)
   Subjective:    Patient ID: [name], female    DOB: [birth year], 81 y.o.   MRN: [id number]  HPI 81 yo female SNF resident seen for pulmonary consult for ? HCAP vs Bronchitiis with acute on chronic diastolic CHF   9/44/9675 Post hospital follow up  Pt was recently admitted for acute resp failure with ? HCAP vs Bronchitis with acute on chronic Diastolic CHF.  Tx/ w initial BIPAP , IV abx, O2  And steroids and nebs.  Required diuresis . Did have troponin bump, seen by cardiology  Felt probable secondary to demand ischemia .  Since discharge she is feeling better.  Cough and congestion are improved.  Still weak and gets winded easily .  No chest pain,orthopnea, edema or fever.  Previous patient of Dr. Annamaria Boots      Review of Systems Constitutional:   No  weight loss, night sweats,  Fevers, chills, + fatigue, or  lassitude.  HEENT:   No headaches,  Difficulty swallowing,  Tooth/dental problems, or  Sore throat,                No sneezing, itching, ear ache, nasal congestion, post nasal drip,   CV:  No chest pain,  Orthopnea, PND, swelling in lower extremities, anasarca, dizziness, palpitations, syncope.   GI  No heartburn, indigestion, abdominal pain, nausea, vomiting, diarrhea, change in bowel habits, loss of appetite, bloody stools.   Resp:   No wheezing.  No chest wall deformity  Skin: no rash or lesions.  GU: no dysuria, change in color of urine, no urgency or frequency.  No flank pain, no hematuria   MS:  No joint pain or swelling.  No decreased range of motion.  No back pain.  Psych:  No change in mood or affect. No depression or anxiety.  No memory loss.          Objective:   Physical Exam GEN: A/Ox3; pleasant , NAD, frail and elderly in wheelchair   HEENT:  Patrick/AT,  EACs-clear, TMs-wnl, NOSE-clear, THROAT-clear, no lesions, no postnasal drip or exudate noted.   NECK:  Supple w/ fair ROM; no JVD; normal carotid impulses w/o bruits; no thyromegaly or nodules  palpated; no lymphadenopathy.  RESP Decreased BS in bases w/o, wheezes/ rales/ or rhonchi.no accessory muscle use, no dullness to percussion  CARD:  RRR, no m/r/g  , no peripheral edema, pulses intact, no cyanosis or clubbing.  GI:   Soft & nt; nml bowel sounds; no organomegaly or masses detected.  Musco: Warm bil, no deformities or joint swelling noted.   Neuro: alert, no focal deficits noted.    Skin: Warm, no lesions or rashes         Assessment & Plan:

## 2014-11-24 ENCOUNTER — Encounter: Payer: Self-pay | Admitting: Adult Health

## 2014-11-24 DIAGNOSIS — J189 Pneumonia, unspecified organism: Secondary | ICD-10-CM | POA: Insufficient documentation

## 2014-11-24 NOTE — Assessment & Plan Note (Signed)
sats at rest are normal  Will cont w/ O2 with act and At bedtime    Plan   Continue on current regimen .  Wear Oxygen 2l/m As needed  And At bedtime   Chest xray will be ordered.  follow up Dr. Annamaria Boots  In 6 weeks and As needed

## 2014-11-24 NOTE — Assessment & Plan Note (Signed)
HCAP vs Bronchitis  Check cxr  Xray down , order sent to SNF   Plan  Continue on current regimen .  Wear Oxygen 2l/m As needed  And At bedtime   Chest xray will be ordered.  follow up Dr. Annamaria Boots  In 6 weeks and As needed

## 2014-12-11 DIAGNOSIS — Z7952 Long term (current) use of systemic steroids: Secondary | ICD-10-CM | POA: Diagnosis not present

## 2014-12-11 DIAGNOSIS — H04123 Dry eye syndrome of bilateral lacrimal glands: Secondary | ICD-10-CM | POA: Diagnosis not present

## 2014-12-11 DIAGNOSIS — H1044 Vernal conjunctivitis: Secondary | ICD-10-CM | POA: Diagnosis not present

## 2014-12-11 DIAGNOSIS — Z961 Presence of intraocular lens: Secondary | ICD-10-CM | POA: Diagnosis not present

## 2014-12-15 ENCOUNTER — Encounter: Payer: Self-pay | Admitting: Nurse Practitioner

## 2014-12-15 ENCOUNTER — Ambulatory Visit (INDEPENDENT_AMBULATORY_CARE_PROVIDER_SITE_OTHER): Payer: Medicare Other | Admitting: Nurse Practitioner

## 2014-12-15 VITALS — BP 130/70 | HR 87 | Ht 65.0 in | Wt 159.0 lb

## 2014-12-15 DIAGNOSIS — E785 Hyperlipidemia, unspecified: Secondary | ICD-10-CM | POA: Diagnosis not present

## 2014-12-15 DIAGNOSIS — I2510 Atherosclerotic heart disease of native coronary artery without angina pectoris: Secondary | ICD-10-CM | POA: Diagnosis not present

## 2014-12-15 DIAGNOSIS — I5032 Chronic diastolic (congestive) heart failure: Secondary | ICD-10-CM | POA: Insufficient documentation

## 2014-12-15 DIAGNOSIS — I421 Obstructive hypertrophic cardiomyopathy: Secondary | ICD-10-CM | POA: Diagnosis not present

## 2014-12-15 DIAGNOSIS — I10 Essential (primary) hypertension: Secondary | ICD-10-CM

## 2014-12-15 NOTE — Patient Instructions (Signed)
Medication Instructions:  Your physician recommends that you continue on your current medications as directed. Please refer to the Current Medication list given to you today.  Labwork: NONE  Testing/Procedures: Your physician has requested that you have a lexiscan myoview. For further information please visit [email or web address]. Please follow instruction sheet, as given.  Follow-Up: Your physician recommends that you schedule a follow-up appointment in: 3 months with Dr. Tamala Julian.  Any Other Special Instructions Will Be Listed Below (If Applicable).

## 2014-12-15 NOTE — Progress Notes (Signed)
Patient Name: [name] Date of Encounter: 12/15/2014  Primary Care Provider:  [name], MD Primary Cardiologist:  Linard Millers, MD   Chief Complaint  81 year old female status post recent hospitalization for orchitis versus healthcare acquired pneumonia, given by troponin elevation, who presents for follow-up today.  Past Medical History   Past Medical History  Diagnosis Date  . Arthritis   . Right ear pain   . Renal insufficiency   . RA (rheumatoid arthritis)   . PUD (peptic ulcer disease)   . GI bleed 2005  . GERD (gastroesophageal reflux disease)   . Hiatal hernia   . Hypothyroidism   . Osteoporosis   . Depression   . Anxiety   . Coronary artery disease     a. 2012 s/p NSTEMI/Cath: 95% apical LAD dzs, otw nonobs dzs-->Med Rx;  b. 05/2011 MV: no ischemia, EF 79%, inf infarct- attenuation.  . CVA (cerebral vascular accident)   . TIA (transient ischemic attack)   . Fall   . DVT of lower extremity (deep venous thrombosis)   . HCAP (healthcare-associated pneumonia) 01/22/2013  . Enteritis due to Clostridium difficile 01/04/2013  . Sepsis 12/19/2012  . Recurrent colitis due to Clostridium difficile 06/02/2012  . Essential hypertension   . Hyperlipidemia   . Fatigue   . Weight loss   . Hemorrhoids   . Malnutrition   . Dyspnea   . Elevated troponin     a. 10/2014 in setting of sepsis/?HCAP.  Marland Kitchen Chronic diastolic CHF (congestive heart failure)     a. 10/2014 Echo: EF 60-65%, mild LVH, grade 1 DD, dynamic obstruction @ rest - peak velocity of 271 cm/sec, peak grad of 8mmHg, PASP 54mmHg.  Marland Kitchen Obstructive hypertrophic cardiomyopathy     a. 10/2014 Echo: EF 60-65%, mild LVH, grade 1 DD, dynamic obstruction @ rest - peak velocity of 271 cm/sec, peak grad of 76mmHg, PASP 44mmHg.   Past Surgical History  Procedure Laterality Date  . Nstemi  06/2010  . Spinal fusion surgery    . Knee surgery    . Cardiac catheterization      SHOWED RUPTURE PLAQUE IN THE LAD. THE LAD IS  NONOBSTRUCTIVE WITH ONLY 30-40% STENOSIS  . Back surgery    . Breast surgery  1964    x3  . Ankle surgery    . Colonoscopy      Allergies  Allergies  Allergen Reactions  . Biphosphate   . Morphine And Related Other (See Comments)    unknown  . Percocet [Oxycodone-Acetaminophen]     unknown  . Plavix [Clopidogrel Bisulfate] Other (See Comments)    RASH  . Sulfur Other (See Comments)    GI upset    HPI  81 year old female with the above problem list. She was recently admitted to [facility] long hospital with fever and dyspnea. She was diagnosed with healthcare acquired pneumonia. She was noted to have elevated troponins. Echocardiogram was performed and showed normal LV function. She was seen by our team with recommendation for outpatient stress testing once she recovered. She was also seen by pulmonology who felt that her x-rays were likely more consistent with bronchitis and not pneumonia. Regardless, she had clinical improvement and subsequent discharged home on oxygen to be used with activity and at night. She is staying in a nursing home and working with physical therapy. She does experience dyspnea while working with physical therapy but otherwise does reasonably well. She denies chest pain, PND, orthopnea, dizziness, syncope, edema, or early satiety.  Home Medications  Prior to Admission medications   Medication Sig Start Date End Date Taking? Authorizing Provider  albuterol (PROVENTIL HFA;VENTOLIN HFA) 108 (90 BASE) MCG/ACT inhaler Inhale 1 puff into the lungs every 6 (six) hours as needed for wheezing or shortness of breath.   Yes Historical Provider, MD  albuterol (PROVENTIL) (2.5 MG/3ML) 0.083% nebulizer solution Take 2.5 mg by nebulization every 6 (six) hours as needed for wheezing or shortness of breath (wheezing).   Yes Historical Provider, MD  aspirin 81 MG chewable tablet Chew 81 mg by mouth daily.   Yes Historical Provider, MD  calcium-vitamin D (OSCAL WITH D) 500-200  MG-UNIT per tablet Take 1 tablet by mouth 3 (three) times daily.   Yes Historical Provider, MD  cetirizine (ZYRTEC) 10 MG tablet Take 10 mg by mouth daily.   Yes Historical Provider, MD  Cholecalciferol (VITAMIN D) 2000 UNITS CAPS Take 1 capsule (2,000 Units total) by mouth daily. 11/21/13  Yes [name], NP  Cranberry 200 MG CAPS Take 2 capsules by mouth 2 (two) times daily.   Yes Historical Provider, MD  dextromethorphan-guaiFENesin (MUCINEX DM) 30-600 MG per 12 hr tablet Take 1 tablet by mouth 2 (two) times daily as needed for cough.   Yes Historical Provider, MD  esomeprazole (NEXIUM) 40 MG capsule Take 1 capsule (40 mg total) by mouth 2 (two) times daily before a meal. 11/06/14  Yes Thurnell Lose, MD  ferrous sulfate 325 (65 FE) MG tablet Take 325 mg by mouth daily with breakfast.   Yes Historical Provider, MD  HYDROcodone-acetaminophen (NORCO/VICODIN) 5-325 MG per tablet Take 1 tablet by mouth every 6 (six) hours as needed for severe pain. Take one tablet by mouth every 4 to 6 hours as needed for pain. Do not exceed 4gm of Tylenol in 24 hours 11/06/14  Yes Thurnell Lose, MD  ipratropium (ATROVENT) 0.03 % nasal spray Place 2 sprays into both nostrils every 12 (twelve) hours.   Yes Historical Provider, MD  levothyroxine (SYNTHROID, LEVOTHROID) 50 MCG tablet Take 50 mcg by mouth daily.    Yes Historical Provider, MD  Lidocaine, Anorectal, 5 % CREA Apply 1 application topically 4 (four) times daily as needed (pain).    Yes Historical Provider, MD  LORazepam (ATIVAN) 0.5 MG tablet Take one tablet by mouth every night at bedtime for anxiety 11/06/14  Yes Thurnell Lose, MD  Menthol, Topical Analgesic, 4 % GEL Apply 1 application topically 3 (three) times daily as needed (knee pain  may leave at bedside).    Yes Historical Provider, MD  methocarbamol (ROBAXIN) 500 MG tablet Take 500 mg by mouth at bedtime.    Yes Historical Provider, MD  metoprolol tartrate (LOPRESSOR) 25 MG tablet Take 25 mg by  mouth 2 (two) times daily.   Yes Historical Provider, MD  montelukast (SINGULAIR) 10 MG tablet Take 10 mg by mouth at bedtime.   Yes Historical Provider, MD  Multiple Vitamin (MULTIVITAMIN) tablet Take 1 tablet by mouth daily.     Yes Historical Provider, MD  Omega-3 Fatty Acids (FISH OIL) 1200 MG CAPS Take 1,200 mg by mouth daily.   Yes Historical Provider, MD  Polyethyl Glycol-Propyl Glycol (SYSTANE) 0.4-0.3 % SOLN Apply 1 drop to eye 2 (two) times daily.   Yes Historical Provider, MD  predniSONE (DELTASONE) 5 MG tablet Label  & dispense according to the schedule below. 10 Pills PO for 3 days then, 8 Pills PO for 3 days, 6 Pills PO for 3 days,  4 Pills PO for 3 days, then continue taking 2 pills daily as before 11/06/14  Yes Thurnell Lose, MD  promethazine (PHENERGAN) 25 MG tablet Take 25 mg by mouth every 6 (six) hours as needed for nausea or vomiting.   Yes Historical Provider, MD  rosuvastatin (CRESTOR) 20 MG tablet Take 20 mg by mouth every evening.  07/05/13  Yes [name], NP  saccharomyces boulardii (FLORASTOR) 250 MG capsule Take 250 mg by mouth 2 (two) times daily.    Yes Historical Provider, MD  sertraline (ZOLOFT) 50 MG tablet Take 50 mg by mouth every morning.   Yes Historical Provider, MD    Review of Systems  As above, she does express dyspnea on exertion and is using O2 at home in the nursing home. She is not having chest pain, PND, orthopnea, dizziness, syncope, edema, or early satiety.  All other systems reviewed and are otherwise negative except as noted above.  Physical Exam  VS:  BP 130/70 mmHg  Pulse 87  Ht 5\' 5"  (1.651 m)  Wt 159 lb (72.122 kg)  BMI 26.46 kg/m2  SpO2 84%  LMP  (LMP Unknown) , BMI Body mass index is 26.46 kg/(m^2). GEN: Well nourished, well developed, in no acute distress. HEENT: normal. Neck: Supple, no JVD, carotid bruits, or masses. Cardiac: RRR, no murmurs, rubs, or gallops. No clubbing, cyanosis, edema.  Radials/DP/PT 2+ and equal  bilaterally.  Respiratory:  Respirations regular and unlabored, clear to auscultation bilaterally. GI: Soft, nontender, nondistended, BS + x 4. MS: no deformity or atrophy. Skin: warm and dry, no rash. Neuro:  Strength and sensation are intact. Psych: Normal affect.  Accessory Clinical Findings  ECG -regular sinus rhythm, 87, inferolateral q's, no acute ST or T changes.  Assessment & Plan  1.  Coronary artery disease/recent troponin elevation: Patient has a known history of apical LAD disease status post catheterization in 2012. She was recently admitted to [facility] long in the setting of respiratory infection and hypoxia and was noted to have troponin elevation. Echocardiogram was normal. Recommendation was made for outpatient stress testing. She has been doing well since her discharge without chest pain. She does have dyspnea on exertion and is wearing oxygen therapy with activities. I will arrange for a lexiscan Myoview to reassess ischemia. Her most recent stress testing was nonischemic in 2012. Of note, she did have hematemesis prior to her most recent hospitalization though she is currently tolerating aspirin and has not had any recurrence of hematemesis. She was seen by gastroenterology but not felt to be an ideal EGD candidate at the time secondary to respiratory failure. We will need to keep this in mind as we evaluate her stress test and determine whether or not she requires catheterization. Continue beta blocker and statin therapy.  2. Essential hypertension: Stable.  3. Hyperlipidemia: Continue statin therapy.  LDL 59 in January with normal LFT's in May.  4. Obstructive hypertrophic cardiopathy/chronic diastolic congestive heart failure: Euvolemic on exam. Heart rate and blood pressure stable. Continue beta blocker.  5. Disposition: Arrange for stress testing and follow-up with Dr. Tamala Julian in 3 months or sooner if necessary.    Murray Hodgkins, NP 12/15/2014, 4:29 PM

## 2014-12-25 ENCOUNTER — Other Ambulatory Visit: Payer: Self-pay | Admitting: *Deleted

## 2014-12-25 MED ORDER — LORAZEPAM 0.5 MG PO TABS: ORAL_TABLET | ORAL | Status: DC

## 2014-12-25 NOTE — Telephone Encounter (Signed)
[facility] 

## 2014-12-28 ENCOUNTER — Telehealth (HOSPITAL_COMMUNITY): Payer: Self-pay | Admitting: *Deleted

## 2014-12-28 NOTE — Telephone Encounter (Signed)
Patient nurse at [facility] place given detailed instructions per Myocardial Perfusion Study Information Sheet for test on 01/03/15 at 0915. Patient nurse was Notified of patient to arrive 15 minutes early, and that it is imperative to arrive on time for appointment to keep from having the test rescheduled. Patient nurse verbalized understanding. Coral Soler, Ranae Palms

## 2015-01-03 ENCOUNTER — Encounter (HOSPITAL_COMMUNITY): Payer: Medicare Other

## 2015-01-05 ENCOUNTER — Ambulatory Visit (HOSPITAL_COMMUNITY): Payer: Medicare Other | Attending: Nurse Practitioner

## 2015-01-05 DIAGNOSIS — I5032 Chronic diastolic (congestive) heart failure: Secondary | ICD-10-CM | POA: Diagnosis not present

## 2015-01-05 DIAGNOSIS — E785 Hyperlipidemia, unspecified: Secondary | ICD-10-CM | POA: Diagnosis not present

## 2015-01-05 DIAGNOSIS — I10 Essential (primary) hypertension: Secondary | ICD-10-CM | POA: Insufficient documentation

## 2015-01-05 DIAGNOSIS — I421 Obstructive hypertrophic cardiomyopathy: Secondary | ICD-10-CM | POA: Diagnosis not present

## 2015-01-05 DIAGNOSIS — Z8673 Personal history of transient ischemic attack (TIA), and cerebral infarction without residual deficits: Secondary | ICD-10-CM | POA: Insufficient documentation

## 2015-01-05 DIAGNOSIS — I2510 Atherosclerotic heart disease of native coronary artery without angina pectoris: Secondary | ICD-10-CM | POA: Insufficient documentation

## 2015-01-05 LAB — MYOCARDIAL PERFUSION IMAGING
LV dias vol: 41 mL
LVSYSVOL: 9 mL
Peak HR: 103 {beats}/min
RATE: 0.31
Rest HR: 80 {beats}/min
SDS: 7
SRS: 14
SSS: 21
TID: 1.15

## 2015-01-05 MED ORDER — REGADENOSON 0.4 MG/5ML IV SOLN
0.4000 mg | Freq: Once | INTRAVENOUS | Status: AC
  Administered 2015-01-05: 0.4 mg via INTRAVENOUS

## 2015-01-05 MED ORDER — TECHNETIUM TC 99M SESTAMIBI GENERIC - CARDIOLITE
32.3000 | Freq: Once | INTRAVENOUS | Status: AC | PRN
  Administered 2015-01-05: 32.3 via INTRAVENOUS

## 2015-01-05 MED ORDER — TECHNETIUM TC 99M SESTAMIBI GENERIC - CARDIOLITE
10.7000 | Freq: Once | INTRAVENOUS | Status: AC | PRN
  Administered 2015-01-05: 11 via INTRAVENOUS

## 2015-01-08 ENCOUNTER — Other Ambulatory Visit: Payer: Self-pay | Admitting: Internal Medicine

## 2015-01-08 ENCOUNTER — Telehealth: Payer: Self-pay | Admitting: *Deleted

## 2015-01-08 DIAGNOSIS — Z1231 Encounter for screening mammogram for malignant neoplasm of breast: Secondary | ICD-10-CM

## 2015-01-08 NOTE — Progress Notes (Unsigned)
Closing this encounter, as this was a phone call and not a visit.

## 2015-01-08 NOTE — Telephone Encounter (Signed)
Patient notified of Myocardial Perfusion study results - "Normal stress test. No need for further ischemic evaluation. Nuclear stress EF: 78%.  This is a low risk study.  The left ventricular ejection fraction is hyperdynamic (>65%).  No EKG changes of ischemia. Hyperdynamic LV with small LV cavity. No segmental wall motion abnormalities ", per Ignacia Bayley, NP.  Patient verbalized understanding and appreciation for phone call. Denies further questions or concerns. Requested copy of results and the MD's business card be mailed to her, if possible.  Completed.

## 2015-01-16 ENCOUNTER — Ambulatory Visit (HOSPITAL_COMMUNITY): Payer: Medicare Other

## 2015-01-17 ENCOUNTER — Non-Acute Institutional Stay (SKILLED_NURSING_FACILITY): Payer: Medicare Other | Admitting: Nurse Practitioner

## 2015-01-17 DIAGNOSIS — E038 Other specified hypothyroidism: Secondary | ICD-10-CM | POA: Diagnosis not present

## 2015-01-17 DIAGNOSIS — I2510 Atherosclerotic heart disease of native coronary artery without angina pectoris: Secondary | ICD-10-CM | POA: Diagnosis not present

## 2015-01-17 DIAGNOSIS — K219 Gastro-esophageal reflux disease without esophagitis: Secondary | ICD-10-CM | POA: Diagnosis not present

## 2015-01-17 DIAGNOSIS — E2740 Unspecified adrenocortical insufficiency: Secondary | ICD-10-CM | POA: Diagnosis not present

## 2015-01-17 DIAGNOSIS — I10 Essential (primary) hypertension: Secondary | ICD-10-CM

## 2015-01-17 DIAGNOSIS — E785 Hyperlipidemia, unspecified: Secondary | ICD-10-CM

## 2015-01-17 DIAGNOSIS — E039 Hypothyroidism, unspecified: Secondary | ICD-10-CM | POA: Diagnosis not present

## 2015-01-17 MED ORDER — PREDNISONE 5 MG PO TABS: ORAL_TABLET | ORAL | Status: DC

## 2015-01-17 NOTE — Progress Notes (Signed)
Patient ID: [name], female   DOB: 1930/03/08, 81 y.o.   MRN: [id number]    Nursing Home Location:  [place] of Service: SNF (31)  PCP: [name], MD  Allergies  Allergen Reactions  . Biphosphate   . Morphine And Related Other (See Comments)    unknown  . Percocet [Oxycodone-Acetaminophen]     unknown  . Plavix [Clopidogrel Bisulfate] Other (See Comments)    RASH  . Sulfur Other (See Comments)    GI upset    Chief Complaint  Patient presents with  . Medical Management of Chronic Issues    HPI:  Patient is a 81 y.o. female seen today at [facility] for routine follow up on chronic condition. Pt with a pmh of RA, osteoporosis, adrenal insufficiency, GERD, depression, HTN, AR, hypothyroidism. Pt was hospitalized in may due to sepsis due to HCAP. Pt has been doing well since. O2 has been stable, pt does report increase in shortness of breath at times. No current shortness of breath during visit. No chest pains. Following with pulmonary and cardiology. No acute issues in the last month. Staff without concerns. Pt does not have any acute complaints.  Review of Systems:  Review of Systems  Constitutional: Negative for activity change, appetite change, fatigue and unexpected weight change.  HENT: Negative for congestion and hearing loss.   Eyes: Negative.   Respiratory: Negative for cough and shortness of breath.   Cardiovascular: Negative for chest pain, palpitations and leg swelling.  Gastrointestinal: Negative for abdominal pain, diarrhea and constipation.  Genitourinary: Negative for dysuria and difficulty urinating.  Musculoskeletal: Negative for myalgias and arthralgias.  Skin: Negative for color change and wound.  Neurological: Negative for dizziness and weakness.  Psychiatric/Behavioral: Negative for behavioral problems, confusion and agitation.    Past Medical History  Diagnosis Date  . Arthritis   . Right ear  pain   . Renal insufficiency   . RA (rheumatoid arthritis)   . PUD (peptic ulcer disease)   . GI bleed 2005  . GERD (gastroesophageal reflux disease)   . Hiatal hernia   . Hypothyroidism   . Osteoporosis   . Depression   . Anxiety   . Coronary artery disease     a. 2012 s/p NSTEMI/Cath: 95% apical LAD dzs, otw nonobs dzs-->Med Rx;  b. 05/2011 MV: no ischemia, EF 79%, inf infarct- attenuation.  . CVA (cerebral vascular accident)   . TIA (transient ischemic attack)   . Fall   . DVT of lower extremity (deep venous thrombosis)   . HCAP (healthcare-associated pneumonia) 01/22/2013  . Enteritis due to Clostridium difficile 01/04/2013  . Sepsis 12/19/2012  . Recurrent colitis due to Clostridium difficile 06/02/2012  . Essential hypertension   . Hyperlipidemia   . Fatigue   . Weight loss   . Hemorrhoids   . Malnutrition   . Dyspnea   . Elevated troponin     a. 10/2014 in setting of sepsis/?HCAP.  Marland Kitchen Chronic diastolic CHF (congestive heart failure)     a. 10/2014 Echo: EF 60-65%, mild LVH, grade 1 DD, dynamic obstruction @ rest - peak velocity of 271 cm/sec, peak grad of 60mmHg, PASP 19mmHg.  Marland Kitchen Obstructive hypertrophic cardiomyopathy     a. 10/2014 Echo: EF 60-65%, mild LVH, grade 1 DD, dynamic obstruction @ rest - peak velocity of 271 cm/sec, peak grad of 70mmHg, PASP 61mmHg.   Past Surgical History  Procedure Laterality Date  . Nstemi  06/2010  . Spinal fusion surgery    . Knee surgery    . Cardiac catheterization      SHOWED RUPTURE PLAQUE IN THE LAD. THE LAD IS NONOBSTRUCTIVE WITH ONLY 30-40% STENOSIS  . Back surgery    . Breast surgery  1964    x3  . Ankle surgery    . Colonoscopy     Social History:   reports that she has never smoked. She has never used smokeless tobacco. She reports that she does not drink alcohol or use illicit drugs.  Family History  Problem Relation Age of Onset  . Kidney disease Mother   . Kidney disease Brother   . Lung cancer Father      Medications: Patient's Medications  New Prescriptions   No medications on file  Previous Medications   ALBUTEROL (PROVENTIL HFA;VENTOLIN HFA) 108 (90 BASE) MCG/ACT INHALER    Inhale 1 puff into the lungs every 6 (six) hours as needed for wheezing or shortness of breath.   ALBUTEROL (PROVENTIL) (2.5 MG/3ML) 0.083% NEBULIZER SOLUTION    Take 2.5 mg by nebulization every 6 (six) hours as needed for wheezing or shortness of breath (wheezing).   ASPIRIN 81 MG CHEWABLE TABLET    Chew 81 mg by mouth daily.   CALCIUM-VITAMIN D (OSCAL WITH D) 500-200 MG-UNIT PER TABLET    Take 1 tablet by mouth 3 (three) times daily.   CETIRIZINE (ZYRTEC) 10 MG TABLET    Take 10 mg by mouth daily.   CHOLECALCIFEROL (VITAMIN D) 2000 UNITS CAPS    Take 1 capsule (2,000 Units total) by mouth daily.   CRANBERRY 200 MG CAPS    Take 2 capsules by mouth 2 (two) times daily.   DEXTROMETHORPHAN-GUAIFENESIN (MUCINEX DM) 30-600 MG PER 12 HR TABLET    Take 1 tablet by mouth 2 (two) times daily as needed for cough.   ESOMEPRAZOLE (NEXIUM) 40 MG CAPSULE    Take 1 capsule (40 mg total) by mouth 2 (two) times daily before a meal.   FERROUS SULFATE 325 (65 FE) MG TABLET    Take 325 mg by mouth daily with breakfast.   HYDROCODONE-ACETAMINOPHEN (NORCO/VICODIN) 5-325 MG PER TABLET    Take 1 tablet by mouth every 6 (six) hours as needed for severe pain. Take one tablet by mouth every 4 to 6 hours as needed for pain. Do not exceed 4gm of Tylenol in 24 hours   IPRATROPIUM (ATROVENT) 0.03 % NASAL SPRAY    Place 2 sprays into both nostrils every 12 (twelve) hours.   LEVOTHYROXINE (SYNTHROID, LEVOTHROID) 50 MCG TABLET    Take 50 mcg by mouth daily.    LIDOCAINE, ANORECTAL, 5 % CREA    Apply 1 application topically 4 (four) times daily as needed (pain).    LORAZEPAM (ATIVAN) 0.5 MG TABLET    Take one tablet by mouth every night at bedtime for anxiety   MENTHOL, TOPICAL ANALGESIC, 4 % GEL    Apply 1 application topically 3 (three) times daily  as needed (knee pain  may leave at bedside).    METHOCARBAMOL (ROBAXIN) 500 MG TABLET    Take 500 mg by mouth at bedtime.    METOPROLOL TARTRATE (LOPRESSOR) 25 MG TABLET    Take 25 mg by mouth 2 (two) times daily.   MONTELUKAST (SINGULAIR) 10 MG TABLET    Take 10 mg by mouth at bedtime.   MULTIPLE VITAMIN (MULTIVITAMIN) TABLET    Take 1 tablet by mouth daily.  OMEGA-3 FATTY ACIDS (FISH OIL) 1200 MG CAPS    Take 1,200 mg by mouth daily.   POLYETHYL GLYCOL-PROPYL GLYCOL (SYSTANE) 0.4-0.3 % SOLN    Apply 1 drop to eye 2 (two) times daily.   PROMETHAZINE (PHENERGAN) 25 MG TABLET    Take 25 mg by mouth every 6 (six) hours as needed for nausea or vomiting.   ROSUVASTATIN (CRESTOR) 20 MG TABLET    Take 20 mg by mouth every evening.    SACCHAROMYCES BOULARDII (FLORASTOR) 250 MG CAPSULE    Take 250 mg by mouth 2 (two) times daily.    SERTRALINE (ZOLOFT) 50 MG TABLET    Take 50 mg by mouth every morning.  Modified Medications   Modified Medication Previous Medication   PREDNISONE (DELTASONE) 5 MG TABLET predniSONE (DELTASONE) 5 MG tablet      2 tablets daily    Label  & dispense according to the schedule below. 10 Pills PO for 3 days then, 8 Pills PO for 3 days, 6 Pills PO for 3 days, 4 Pills PO for 3 days, then continue taking 2 pills daily as before  Discontinued Medications   No medications on file     Physical Exam: Filed Vitals:   01/17/15 1559  BP: 122/65  Pulse: 73  Temp: 98 F (36.7 C)  Resp: 20  Weight: 148 lb (67.132 kg)    Physical Exam  Constitutional: She is oriented to person, place, and time. She appears well-developed and well-nourished. No distress.  HENT:  Head: Normocephalic and atraumatic.  Mouth/Throat: Oropharynx is clear and moist. No oropharyngeal exudate.  Eyes: Conjunctivae are normal. Pupils are equal, round, and reactive to light.  Neck: Normal range of motion. Neck supple.  Cardiovascular: Normal rate, regular rhythm and normal heart sounds.    Pulmonary/Chest: Effort normal and breath sounds normal.  Abdominal: Soft. Bowel sounds are normal.  Musculoskeletal: She exhibits no edema or tenderness.  Neurological: She is alert and oriented to person, place, and time.  Skin: Skin is warm and dry. She is not diaphoretic.  Psychiatric: She has a normal mood and affect.    Labs reviewed: Basic Metabolic Panel:  Recent Labs  10/29/14 1659  11/02/14 0846 11/03/14 0350 11/04/14 0519 11/05/14 0830  NA 137  < > 140 136 141  --   K 4.1  < > 3.4* 3.5 3.4* 4.5  CL 104  < > 109 106 107  --   CO2 13*  < > 24 23 25   --   GLUCOSE 352*  < > 89 100* 123*  --   BUN 21*  < > 32* 28* 27*  --   CREATININE 1.80*  < > 1.54* 1.70* 1.72*  --   CALCIUM 9.1  < > 8.0* 7.9* 8.4*  --   MG 1.9  --  1.7 2.0  --   --   PHOS 4.8*  --   --   --   --   --   < > = values in this interval not displayed. Liver Function Tests:  Recent Labs  10/29/14 1029 10/29/14 1659 10/30/14 0414  AST 39 44* 41  ALT 19 23 25   ALKPHOS 47 47 48  BILITOT 1.5* 0.8 0.9  PROT 6.3* 6.3* 6.7  ALBUMIN 3.3* 3.4* 3.3*   No results for input(s): LIPASE, AMYLASE in the last 8760 hours. No results for input(s): AMMONIA in the last 8760 hours. CBC:  Recent Labs  10/29/14 1029  11/01/14 0356 11/02/14 0340 11/02/14  6644 11/03/14 0350  WBC 14.1*  < > 9.7  --  12.0* 11.7*  NEUTROABS 11.4*  --   --   --   --   --   HGB 11.8*  < > 9.8* 9.5* 10.1* 9.5*  HCT 38.2  < > 31.3* 29.5* 31.5* 29.6*  MCV 90.5  < > 91.3  --  89.2 87.8  PLT 110*  < > 106*  --  107* 109*  < > = values in this interval not displayed. TSH:  Recent Labs  05/12/14 07/06/14  TSH 0.66 1.03   A1C: No results found for: HGBA1C Lipid Panel:  Recent Labs  07/13/14  CHOL 161  HDL 52  LDLCALC 59  TRIG 252*     Assessment/Plan 1. Essential hypertension Stable, conts on lopressor twice daily   2. Atherosclerosis of native coronary artery of native heart without angina pectoris Stable,  Following with cardiology, no chest pains, conts with ASA daily  3. Gastroesophageal reflux disease without esophagitis Remains with some acid reflux depending on diet, however improve with Nexium twice daily   4. Adrenal insufficiency -stable, conts on prednisone 10 mg daily  5. Hyperlipidemia LDL at goal in January, remains on crestor and fish oil  6. Hypothyroidism, unspecified hypothyroidism type TSH stable in January, conts on synthroid 50 mcgs    Dravon Nott K. Harle Battiest  [facility] [phone] 8 am - 5 pm) [phone] (after hours)

## 2015-01-22 ENCOUNTER — Telehealth: Payer: Self-pay | Admitting: Internal Medicine

## 2015-01-22 NOTE — Telephone Encounter (Signed)
Attempted to call patient, mailbox full, wcb

## 2015-01-24 ENCOUNTER — Encounter: Payer: Self-pay | Admitting: Interventional Cardiology

## 2015-01-24 ENCOUNTER — Telehealth: Payer: Self-pay | Admitting: Interventional Cardiology

## 2015-01-24 NOTE — Telephone Encounter (Signed)
New Message     Pt is calling wanting rn to call her back to talk about her stress test.

## 2015-01-24 NOTE — Telephone Encounter (Signed)
Attempted to call x2. Voicemail is full. Will try back later.

## 2015-01-24 NOTE — Telephone Encounter (Signed)
Returned pt call. Pt rqst a copy of her stress test results. Results mailed to address listed below at pt rqst: [address], [place] [id number]

## 2015-01-25 NOTE — Telephone Encounter (Signed)
ATC, voicemail is full.  Left call back number on phone pager [facility]

## 2015-01-26 NOTE — Telephone Encounter (Signed)
ATC. Mailbox full, cannot leave VM. Will sign off on phone note due to several unsuccessful attempts to contact pt - per triage protocol.

## 2015-01-31 DIAGNOSIS — B351 Tinea unguium: Secondary | ICD-10-CM | POA: Diagnosis not present

## 2015-01-31 DIAGNOSIS — I70203 Unspecified atherosclerosis of native arteries of extremities, bilateral legs: Secondary | ICD-10-CM | POA: Diagnosis not present

## 2015-01-31 DIAGNOSIS — G629 Polyneuropathy, unspecified: Secondary | ICD-10-CM | POA: Diagnosis not present

## 2015-01-31 DIAGNOSIS — M79674 Pain in right toe(s): Secondary | ICD-10-CM | POA: Diagnosis not present

## 2015-02-01 DIAGNOSIS — Z79899 Other long term (current) drug therapy: Secondary | ICD-10-CM | POA: Diagnosis not present

## 2015-02-14 ENCOUNTER — Telehealth: Payer: Self-pay | Admitting: Interventional Cardiology

## 2015-02-14 DIAGNOSIS — L820 Inflamed seborrheic keratosis: Secondary | ICD-10-CM | POA: Diagnosis not present

## 2015-02-14 DIAGNOSIS — L821 Other seborrheic keratosis: Secondary | ICD-10-CM | POA: Diagnosis not present

## 2015-02-14 DIAGNOSIS — L919 Hypertrophic disorder of the skin, unspecified: Secondary | ICD-10-CM | POA: Diagnosis not present

## 2015-02-14 NOTE — Telephone Encounter (Signed)
New Message:  Pt called in stating that she had a stress test on 7/8 and she would like to know the results. Please follow up with the pt   Thanks

## 2015-02-15 NOTE — Telephone Encounter (Signed)
Pt has been given verbal myoview results x2. Copies of pt myoview results have been mailed x2

## 2015-02-16 NOTE — Telephone Encounter (Signed)
Returned pt call. Unable to lmom. Pt voicemail full. Pt myoview results mailed for the 3rd time. Pt given verbal results on 2 occasions

## 2015-02-26 ENCOUNTER — Non-Acute Institutional Stay (SKILLED_NURSING_FACILITY): Payer: Medicare Other | Admitting: Nurse Practitioner

## 2015-02-26 DIAGNOSIS — J309 Allergic rhinitis, unspecified: Secondary | ICD-10-CM

## 2015-02-26 DIAGNOSIS — E038 Other specified hypothyroidism: Secondary | ICD-10-CM | POA: Diagnosis not present

## 2015-02-26 DIAGNOSIS — I10 Essential (primary) hypertension: Secondary | ICD-10-CM

## 2015-02-26 DIAGNOSIS — E785 Hyperlipidemia, unspecified: Secondary | ICD-10-CM

## 2015-02-26 DIAGNOSIS — F329 Major depressive disorder, single episode, unspecified: Secondary | ICD-10-CM

## 2015-02-26 DIAGNOSIS — F32A Depression, unspecified: Secondary | ICD-10-CM

## 2015-02-26 DIAGNOSIS — I5032 Chronic diastolic (congestive) heart failure: Secondary | ICD-10-CM | POA: Diagnosis not present

## 2015-02-26 NOTE — Progress Notes (Signed)
Patient ID: [name], female   DOB: [birth year], 81 y.o.   MRN: [id number]    Nursing Home Location:  [place] of Service: SNF (31)  PCP: Samantha Serve, MD  Allergies  Allergen Reactions  . Biphosphate   . Morphine And Related Other (See Comments)    unknown  . Percocet [Oxycodone-Acetaminophen]     unknown  . Plavix [Clopidogrel Bisulfate] Other (See Comments)    RASH  . Sulfur Other (See Comments)    GI upset    Chief Complaint  Patient presents with  . Medical Management of Chronic Issues    HPI:  Patient is a 81 y.o. female seen today at [facility] for medical management of her chronic medical conditions.  She has a history of RA, osteoporosis, depression, HTN, and hypothyroidism.  [name] was hospitalized in May of 2016 for sepsis secondary to HCAP. Since hospitalization has been stable without acute issues. Weight has been stable, good appetite. Participating in activities.   Patient does not complain of any shortness of breath or cough at this time.  Nursing without any acute concerns.    Review of Systems:  Review of Systems  Constitutional: Negative for fever, chills, fatigue and unexpected weight change.  HENT: Negative.   Respiratory: Negative for cough, shortness of breath and wheezing.   Cardiovascular: Negative for chest pain, palpitations and leg swelling.  Gastrointestinal: Negative.   Genitourinary: Negative for dysuria, urgency and frequency.  Musculoskeletal: Negative for myalgias, back pain and arthralgias.  Skin: Negative.   Neurological: Negative for dizziness, light-headedness and headaches.  Psychiatric/Behavioral: Negative for agitation. The patient is not nervous/anxious.     Past Medical History  Diagnosis Date  . Arthritis   . Right ear pain   . Renal insufficiency   . RA (rheumatoid arthritis)   . PUD (peptic ulcer disease)   . GI bleed 2005  . GERD (gastroesophageal reflux  disease)   . Hiatal hernia   . Hypothyroidism   . Osteoporosis   . Depression   . Anxiety   . Coronary artery disease     a. 2012 s/p NSTEMI/Cath: 95% apical LAD dzs, otw nonobs dzs-->Med Rx;  b. 05/2011 MV: no ischemia, EF 79%, inf infarct- attenuation.  . CVA (cerebral vascular accident)   . TIA (transient ischemic attack)   . Fall   . DVT of lower extremity (deep venous thrombosis)   . HCAP (healthcare-associated pneumonia) 01/22/2013  . Enteritis due to Clostridium difficile 01/04/2013  . Sepsis 12/19/2012  . Recurrent colitis due to Clostridium difficile 06/02/2012  . Essential hypertension   . Hyperlipidemia   . Fatigue   . Weight loss   . Hemorrhoids   . Malnutrition   . Dyspnea   . Elevated troponin     a. 10/2014 in setting of sepsis/?HCAP.  Marland Kitchen Chronic diastolic CHF (congestive heart failure)     a. 10/2014 Echo: EF 60-65%, mild LVH, grade 1 DD, dynamic obstruction @ rest - peak velocity of 271 cm/sec, peak grad of 63mmHg, PASP 74mmHg.  Marland Kitchen Obstructive hypertrophic cardiomyopathy     a. 10/2014 Echo: EF 60-65%, mild LVH, grade 1 DD, dynamic obstruction @ rest - peak velocity of 271 cm/sec, peak grad of 29mmHg, PASP 33mmHg.   Past Surgical History  Procedure Laterality Date  . Nstemi  06/2010  . Spinal fusion surgery    . Knee surgery    . Cardiac catheterization  SHOWED RUPTURE PLAQUE IN THE LAD. THE LAD IS NONOBSTRUCTIVE WITH ONLY 30-40% STENOSIS  . Back surgery    . Breast surgery  1964    x3  . Ankle surgery    . Colonoscopy     Social History:   reports that she has never smoked. She has never used smokeless tobacco. She reports that she does not drink alcohol or use illicit drugs.  Family History  Problem Relation Age of Onset  . Kidney disease Mother   . Kidney disease Brother   . Lung cancer Father     Medications: Patient's Medications  New Prescriptions   No medications on file  Previous Medications   ALBUTEROL (PROVENTIL HFA;VENTOLIN HFA) 108 (90  BASE) MCG/ACT INHALER    Inhale 1 puff into the lungs every 6 (six) hours as needed for wheezing or shortness of breath.   ALBUTEROL (PROVENTIL) (2.5 MG/3ML) 0.083% NEBULIZER SOLUTION    Take 2.5 mg by nebulization every 6 (six) hours as needed for wheezing or shortness of breath (wheezing).   ASPIRIN 81 MG CHEWABLE TABLET    Chew 81 mg by mouth daily.   CALCIUM-VITAMIN D (OSCAL WITH D) 500-200 MG-UNIT PER TABLET    Take 1 tablet by mouth 3 (three) times daily.   CETIRIZINE (ZYRTEC) 10 MG TABLET    Take 10 mg by mouth daily.   CHOLECALCIFEROL (VITAMIN D) 2000 UNITS CAPS    Take 1 capsule (2,000 Units total) by mouth daily.   CRANBERRY 200 MG CAPS    Take 2 capsules by mouth 2 (two) times daily.   DEXTROMETHORPHAN-GUAIFENESIN (MUCINEX DM) 30-600 MG PER 12 HR TABLET    Take 1 tablet by mouth 2 (two) times daily as needed for cough.   ESOMEPRAZOLE (NEXIUM) 40 MG CAPSULE    Take 1 capsule (40 mg total) by mouth 2 (two) times daily before a meal.   FERROUS SULFATE 325 (65 FE) MG TABLET    Take 325 mg by mouth daily with breakfast.   HYDROCODONE-ACETAMINOPHEN (NORCO/VICODIN) 5-325 MG PER TABLET    Take 1 tablet by mouth every 6 (six) hours as needed for severe pain. Take one tablet by mouth every 4 to 6 hours as needed for pain. Do not exceed 4gm of Tylenol in 24 hours   IPRATROPIUM (ATROVENT) 0.03 % NASAL SPRAY    Place 2 sprays into both nostrils every 12 (twelve) hours.   LEVOTHYROXINE (SYNTHROID, LEVOTHROID) 50 MCG TABLET    Take 50 mcg by mouth daily.    LIDOCAINE, ANORECTAL, 5 % CREA    Apply 1 application topically 4 (four) times daily as needed (pain).    LORAZEPAM (ATIVAN) 0.5 MG TABLET    Take one tablet by mouth every night at bedtime for anxiety   MENTHOL, TOPICAL ANALGESIC, 4 % GEL    Apply 1 application topically 3 (three) times daily as needed (knee pain  may leave at bedside).    METHOCARBAMOL (ROBAXIN) 500 MG TABLET    Take 500 mg by mouth at bedtime.    METOPROLOL TARTRATE (LOPRESSOR) 25  MG TABLET    Take 25 mg by mouth 2 (two) times daily.   MONTELUKAST (SINGULAIR) 10 MG TABLET    Take 10 mg by mouth at bedtime.   MULTIPLE VITAMIN (MULTIVITAMIN) TABLET    Take 1 tablet by mouth daily.     OMEGA-3 FATTY ACIDS (FISH OIL) 1200 MG CAPS    Take 1,200 mg by mouth daily.   POLYETHYL GLYCOL-PROPYL GLYCOL (  SYSTANE) 0.4-0.3 % SOLN    Apply 1 drop to eye 2 (two) times daily.   PREDNISONE (DELTASONE) 5 MG TABLET    2 tablets daily   PROMETHAZINE (PHENERGAN) 25 MG TABLET    Take 25 mg by mouth every 6 (six) hours as needed for nausea or vomiting.   ROSUVASTATIN (CRESTOR) 20 MG TABLET    Take 20 mg by mouth every evening.    SACCHAROMYCES BOULARDII (FLORASTOR) 250 MG CAPSULE    Take 250 mg by mouth 2 (two) times daily.    SERTRALINE (ZOLOFT) 50 MG TABLET    Take 50 mg by mouth every morning.  Modified Medications   No medications on file  Discontinued Medications   No medications on file     Physical Exam: Filed Vitals:   02/26/15 1356  BP: 116/74  Pulse: 79  Temp: 97.4 F (36.3 C)  TempSrc: Oral  Resp: 18  Weight: 149 lb 12.8 oz (67.949 kg)    Physical Exam  Constitutional: She is oriented to person, place, and time. She appears well-developed and well-nourished.  HENT:  Head: Normocephalic and atraumatic.  Eyes: Pupils are equal, round, and reactive to light.  Neck: Normal range of motion. Neck supple.  Cardiovascular: Normal rate, regular rhythm and normal heart sounds.   Pulmonary/Chest: Effort normal and breath sounds normal. She has no wheezes.  Abdominal: Soft. Bowel sounds are normal.  Musculoskeletal: Normal range of motion.  Neurological: She is alert and oriented to person, place, and time.  Skin: Skin is warm and dry.    Labs reviewed: Basic Metabolic Panel:  Recent Labs  10/29/14 1659  11/02/14 0846 11/03/14 0350 11/04/14 0519 11/05/14 0830  NA 137  < > 140 136 141  --   K 4.1  < > 3.4* 3.5 3.4* 4.5  CL 104  < > 109 106 107  --   CO2 13*  < >  24 23 25   --   GLUCOSE 352*  < > 89 100* 123*  --   BUN 21*  < > 32* 28* 27*  --   CREATININE 1.80*  < > 1.54* 1.70* 1.72*  --   CALCIUM 9.1  < > 8.0* 7.9* 8.4*  --   MG 1.9  --  1.7 2.0  --   --   PHOS 4.8*  --   --   --   --   --   < > = values in this interval not displayed. Liver Function Tests:  Recent Labs  10/29/14 1029 10/29/14 1659 10/30/14 0414  AST 39 44* 41  ALT 19 23 25   ALKPHOS 47 47 48  BILITOT 1.5* 0.8 0.9  PROT 6.3* 6.3* 6.7  ALBUMIN 3.3* 3.4* 3.3*   No results for input(s): LIPASE, AMYLASE in the last 8760 hours. No results for input(s): AMMONIA in the last 8760 hours. CBC:  Recent Labs  10/29/14 1029  11/01/14 0356 11/02/14 0340 11/02/14 0846 11/03/14 0350  WBC 14.1*  < > 9.7  --  12.0* 11.7*  NEUTROABS 11.4*  --   --   --   --   --   HGB 11.8*  < > 9.8* 9.5* 10.1* 9.5*  HCT 38.2  < > 31.3* 29.5* 31.5* 29.6*  MCV 90.5  < > 91.3  --  89.2 87.8  PLT 110*  < > 106*  --  107* 109*  < > = values in this interval not displayed. TSH:  Recent Labs  05/12/14 07/06/14  TSH 0.66 1.03   A1C: No results found for: HGBA1C Lipid Panel:  Recent Labs  07/13/14  CHOL 161  HDL 52  LDLCALC 59  TRIG 252*    Assessment/Plan 1. Chronic diastolic CHF (congestive heart failure) - Stable. Euvolmonic,  Echo in June of 2016 showed EF of 65%.  No shortness of breath.  Continue metoprolol and 1569ml fluid restrictions.    2. Essential hypertension - Stable.  Blood pressures remain within desirable range.   3. Allergic rhinitis, unspecified allergic rhinitis type - No complaints today.  Continue zyrtec.  4. Other specified hypothyroidism TSH stable in June.  Continue synthroid.   5. Depression Patient states she is not feeling depressed. She is able to participate in activities. Continue sertraline.   6. Hyperlipidemia LDL at goal in January, cont on rosuvastatin and fish oil.     Carlos American. Harle Battiest  [facility] [phone] 8 am - 5 pm) [phone] (after hours)

## 2015-03-12 LAB — CBC AND DIFFERENTIAL
HCT: 29 % — AB (ref 36–46)
Hemoglobin: 8.7 g/dL — AB (ref 12.0–16.0)
PLATELETS: 245 10*3/uL (ref 150–399)
WBC: 6.5 10^3/mL

## 2015-03-12 LAB — BASIC METABOLIC PANEL
BUN: 55 mg/dL — AB (ref 4–21)
CREATININE: 2.4 mg/dL — AB (ref 0.5–1.1)
Glucose: 71 mg/dL
POTASSIUM: 4.5 mmol/L (ref 3.4–5.3)
Sodium: 146 mmol/L (ref 137–147)

## 2015-03-19 ENCOUNTER — Non-Acute Institutional Stay (SKILLED_NURSING_FACILITY): Payer: Medicare Other | Admitting: Nurse Practitioner

## 2015-03-19 DIAGNOSIS — R110 Nausea: Secondary | ICD-10-CM

## 2015-03-19 NOTE — Progress Notes (Signed)
Patient ID: [name], female   DOB: 22-Mar-1930, 81 y.o.   MRN: [id number]    Nursing Home Location:  [place] of Service: SNF (31)  PCP: [name], MD  Allergies  Allergen Reactions  . Biphosphate   . Morphine And Related Other (See Comments)    unknown  . Percocet [Oxycodone-Acetaminophen]     unknown  . Plavix [Clopidogrel Bisulfate] Other (See Comments)    RASH  . Sulfur Other (See Comments)    GI upset    Chief Complaint  Patient presents with  . Acute Visit    HPI:  Patient is a 81 y.o. female seen today at [facility] at the request of nursing.  She has a history of RA, osteoporosis, depression, HTN, and hypothyroidism.  Pt noted to have nausea and vomiting with fever for the past few days. Temp of 101 that improved with tylenol yesterday. No fever today. Denies diarrhea, constipation or abdominal pain. Pt reports she is feeling better today. Had 2 episodes of vomiting yesterday, none today. Nausea has improved. Ate breakfast without difficulty. Nursing reports roommate has also been sick  Review of Systems:  Review of Systems  Constitutional: Negative for fever, chills, activity change, fatigue and unexpected weight change.  HENT: Negative.   Respiratory: Negative for cough, shortness of breath and wheezing.   Cardiovascular: Negative for chest pain, palpitations and leg swelling.  Gastrointestinal: Positive for nausea and vomiting. Negative for abdominal pain, diarrhea, constipation, blood in stool, abdominal distention, anal bleeding and rectal pain.  Genitourinary: Negative for dysuria, urgency and frequency.  Skin: Negative.   Neurological: Negative for dizziness and light-headedness.    Past Medical History  Diagnosis Date  . Arthritis   . Right ear pain   . Renal insufficiency   . RA (rheumatoid arthritis)   . PUD (peptic ulcer disease)   . GI bleed 2005  . GERD (gastroesophageal reflux disease)     . Hiatal hernia   . Hypothyroidism   . Osteoporosis   . Depression   . Anxiety   . Coronary artery disease     a. 2012 s/p NSTEMI/Cath: 95% apical LAD dzs, otw nonobs dzs-->Med Rx;  b. 05/2011 MV: no ischemia, EF 79%, inf infarct- attenuation.  . CVA (cerebral vascular accident)   . TIA (transient ischemic attack)   . Fall   . DVT of lower extremity (deep venous thrombosis)   . HCAP (healthcare-associated pneumonia) 01/22/2013  . Enteritis due to Clostridium difficile 01/04/2013  . Sepsis 12/19/2012  . Recurrent colitis due to Clostridium difficile 06/02/2012  . Essential hypertension   . Hyperlipidemia   . Fatigue   . Weight loss   . Hemorrhoids   . Malnutrition   . Dyspnea   . Elevated troponin     a. 10/2014 in setting of sepsis/?HCAP.  Marland Kitchen Chronic diastolic CHF (congestive heart failure)     a. 10/2014 Echo: EF 60-65%, mild LVH, grade 1 DD, dynamic obstruction @ rest - peak velocity of 271 cm/sec, peak grad of 43mmHg, PASP 18mmHg.  Marland Kitchen Obstructive hypertrophic cardiomyopathy     a. 10/2014 Echo: EF 60-65%, mild LVH, grade 1 DD, dynamic obstruction @ rest - peak velocity of 271 cm/sec, peak grad of 82mmHg, PASP 68mmHg.   Past Surgical History  Procedure Laterality Date  . Nstemi  06/2010  . Spinal fusion surgery    . Knee surgery    . Cardiac catheterization  SHOWED RUPTURE PLAQUE IN THE LAD. THE LAD IS NONOBSTRUCTIVE WITH ONLY 30-40% STENOSIS  . Back surgery    . Breast surgery  1964    x3  . Ankle surgery    . Colonoscopy     Social History:   reports that she has never smoked. She has never used smokeless tobacco. She reports that she does not drink alcohol or use illicit drugs.  Family History  Problem Relation Age of Onset  . Kidney disease Mother   . Kidney disease Brother   . Lung cancer Father     Medications: Patient's Medications  New Prescriptions   No medications on file  Previous Medications   ALBUTEROL (PROVENTIL HFA;VENTOLIN HFA) 108 (90 BASE)  MCG/ACT INHALER    Inhale 1 puff into the lungs every 6 (six) hours as needed for wheezing or shortness of breath.   ALBUTEROL (PROVENTIL) (2.5 MG/3ML) 0.083% NEBULIZER SOLUTION    Take 2.5 mg by nebulization every 6 (six) hours as needed for wheezing or shortness of breath (wheezing).   ASPIRIN 81 MG CHEWABLE TABLET    Chew 81 mg by mouth daily.   CALCIUM-VITAMIN D (OSCAL WITH D) 500-200 MG-UNIT PER TABLET    Take 1 tablet by mouth 3 (three) times daily.   CETIRIZINE (ZYRTEC) 10 MG TABLET    Take 10 mg by mouth daily.   CHOLECALCIFEROL (VITAMIN D) 2000 UNITS CAPS    Take 1 capsule (2,000 Units total) by mouth daily.   CRANBERRY 200 MG CAPS    Take 2 capsules by mouth 2 (two) times daily.   ESOMEPRAZOLE (NEXIUM) 40 MG CAPSULE    Take 1 capsule (40 mg total) by mouth 2 (two) times daily before a meal.   FERROUS SULFATE 325 (65 FE) MG TABLET    Take 325 mg by mouth daily with breakfast.   HYDROCODONE-ACETAMINOPHEN (NORCO/VICODIN) 5-325 MG PER TABLET    Take 1 tablet by mouth every 6 (six) hours as needed for severe pain. Take one tablet by mouth every 4 to 6 hours as needed for pain. Do not exceed 4gm of Tylenol in 24 hours   IPRATROPIUM (ATROVENT) 0.03 % NASAL SPRAY    Place 2 sprays into both nostrils every 12 (twelve) hours.   LEVOTHYROXINE (SYNTHROID, LEVOTHROID) 50 MCG TABLET    Take 50 mcg by mouth daily.    LORAZEPAM (ATIVAN) 0.5 MG TABLET    Take one tablet by mouth every night at bedtime for anxiety   METHOCARBAMOL (ROBAXIN) 500 MG TABLET    Take 500 mg by mouth at bedtime.    METOPROLOL TARTRATE (LOPRESSOR) 25 MG TABLET    Take 25 mg by mouth 2 (two) times daily.   MONTELUKAST (SINGULAIR) 10 MG TABLET    Take 10 mg by mouth at bedtime.   MULTIPLE VITAMIN (MULTIVITAMIN) TABLET    Take 1 tablet by mouth daily.     OLOPATADINE (PATANOL) 0.1 % OPHTHALMIC SOLUTION    Place 1 drop into both eyes daily.   OMEGA-3 FATTY ACIDS (FISH OIL) 1200 MG CAPS    Take 1,200 mg by mouth daily.   PREDNISONE  (DELTASONE) 5 MG TABLET    2 tablets daily   PROMETHAZINE (PHENERGAN) 25 MG TABLET    Take 25 mg by mouth every 6 (six) hours as needed for nausea or vomiting.   ROSUVASTATIN (CRESTOR) 20 MG TABLET    Take 20 mg by mouth every evening.    SACCHAROMYCES BOULARDII (FLORASTOR) 250 MG CAPSULE  Take 250 mg by mouth 2 (two) times daily.    SERTRALINE (ZOLOFT) 50 MG TABLET    Take 50 mg by mouth every morning.  Modified Medications   No medications on file  Discontinued Medications   No medications on file     Physical Exam: Filed Vitals:   03/19/15 1920  BP: 122/53  Pulse: 89  Temp: 97.9 F (36.6 C)  Resp: 20    Physical Exam  Constitutional: She is oriented to person, place, and time. She appears well-developed and well-nourished.  HENT:  Head: Normocephalic and atraumatic.  Eyes: Pupils are equal, round, and reactive to light.  Neck: Normal range of motion. Neck supple.  Cardiovascular: Normal rate, regular rhythm and normal heart sounds.   Pulmonary/Chest: Effort normal and breath sounds normal. She has no wheezes.  Abdominal: Soft. Bowel sounds are normal.  Musculoskeletal: Normal range of motion.  Neurological: She is alert and oriented to person, place, and time.  Skin: Skin is warm and dry.  Psychiatric: She has a normal mood and affect.    Labs reviewed: Basic Metabolic Panel:  Recent Labs  10/29/14 1659  11/02/14 0846 11/03/14 0350 11/04/14 0519 11/05/14 0830  NA 137  < > 140 136 141  --   K 4.1  < > 3.4* 3.5 3.4* 4.5  CL 104  < > 109 106 107  --   CO2 13*  < > 24 23 25   --   GLUCOSE 352*  < > 89 100* 123*  --   BUN 21*  < > 32* 28* 27*  --   CREATININE 1.80*  < > 1.54* 1.70* 1.72*  --   CALCIUM 9.1  < > 8.0* 7.9* 8.4*  --   MG 1.9  --  1.7 2.0  --   --   PHOS 4.8*  --   --   --   --   --   < > = values in this interval not displayed. Liver Function Tests:  Recent Labs  10/29/14 1029 10/29/14 1659 10/30/14 0414  AST 39 44* 41  ALT 19 23 25     ALKPHOS 47 47 48  BILITOT 1.5* 0.8 0.9  PROT 6.3* 6.3* 6.7  ALBUMIN 3.3* 3.4* 3.3*   No results for input(s): LIPASE, AMYLASE in the last 8760 hours. No results for input(s): AMMONIA in the last 8760 hours. CBC:  Recent Labs  10/29/14 1029  11/01/14 0356 11/02/14 0340 11/02/14 0846 11/03/14 0350  WBC 14.1*  < > 9.7  --  12.0* 11.7*  NEUTROABS 11.4*  --   --   --   --   --   HGB 11.8*  < > 9.8* 9.5* 10.1* 9.5*  HCT 38.2  < > 31.3* 29.5* 31.5* 29.6*  MCV 90.5  < > 91.3  --  89.2 87.8  PLT 110*  < > 106*  --  107* 109*  < > = values in this interval not displayed. TSH:  Recent Labs  05/12/14 07/06/14  TSH 0.66 1.03   A1C: No results found for: HGBA1C Lipid Panel:  Recent Labs  07/13/14  CHOL 161  HDL 52  LDLCALC 59  TRIG 252*    Assessment/Plan  1. Nausea With vomiting and fever. Improved with tylenol. No fever today and overall pt reports she feels better. Most likely gastroenteritis due to sick roommate as well.  -bland diet and to advance as tolerated -will follow up bmp and cbc -staff to provide ongoing monitoring, notify PRN.  Carlos American. Harle Battiest  [facility] [phone] 8 am - 5 pm) [phone] (after hours)

## 2015-03-20 DIAGNOSIS — R1110 Vomiting, unspecified: Secondary | ICD-10-CM | POA: Diagnosis not present

## 2015-03-20 LAB — BASIC METABOLIC PANEL
BUN: 34 mg/dL — AB (ref 4–21)
Creatinine: 1.6 mg/dL — AB (ref <=1.1)
GLUCOSE: 103 mg/dL
POTASSIUM: 4.2 mmol/L (ref 3.4–5.3)
Sodium: 144 mmol/L (ref 137–147)

## 2015-03-20 LAB — CBC AND DIFFERENTIAL
HEMATOCRIT: 35 % — AB (ref 36–46)
Hemoglobin: 10.6 g/dL — AB (ref 12.0–16.0)
Platelets: 157 10*3/uL (ref 150–399)
WBC: 10.3 10*3/mL

## 2015-03-26 ENCOUNTER — Non-Acute Institutional Stay (SKILLED_NURSING_FACILITY): Payer: Medicare Other | Admitting: Nurse Practitioner

## 2015-03-26 DIAGNOSIS — M25519 Pain in unspecified shoulder: Secondary | ICD-10-CM | POA: Diagnosis not present

## 2015-03-26 DIAGNOSIS — M25539 Pain in unspecified wrist: Secondary | ICD-10-CM | POA: Diagnosis not present

## 2015-03-26 DIAGNOSIS — M25512 Pain in left shoulder: Secondary | ICD-10-CM

## 2015-03-26 DIAGNOSIS — M25529 Pain in unspecified elbow: Secondary | ICD-10-CM | POA: Diagnosis not present

## 2015-03-26 NOTE — Progress Notes (Signed)
Patient ID: [name], female   DOB: 07-21-1929, 81 y.o.   MRN: [id number]    Nursing Home Location:  [place] of Service: SNF (31)  PCP: [name], MD  Allergies  Allergen Reactions  . Biphosphate   . Morphine And Related Other (See Comments)    unknown  . Percocet [Oxycodone-Acetaminophen]     unknown  . Plavix [Clopidogrel Bisulfate] Other (See Comments)    RASH  . Sulfur Other (See Comments)    GI upset    Chief Complaint  Patient presents with  . Acute Visit    pain in left arm    HPI:  Patient is a 81 y.o. female seen today at [facility] [place] after a mechanical fall on 03/20/15.  She fell from the toilet in her bathroom to the floor, but states that she did not hit her head or arms. Landed on her bottom..  She said she was able to lower herself to the floor.  She is complaining today of left shoulder pain, but says she did not hit her left shoulder when she fell.  She does not have any visible bruising or erythema.     Review of Systems:  Review of Systems  Constitutional: Negative for fever, activity change and fatigue.  HENT: Negative.   Respiratory: Negative for cough, shortness of breath and wheezing.   Cardiovascular: Negative for chest pain, palpitations and leg swelling.  Gastrointestinal: Negative for abdominal pain.  Genitourinary: Negative for dysuria and flank pain.  Musculoskeletal: Negative for myalgias, back pain and joint swelling.  Skin: Negative for color change, pallor, rash and wound.  Neurological: Negative for dizziness, light-headedness and headaches.    Past Medical History  Diagnosis Date  . Arthritis   . Right ear pain   . Renal insufficiency   . RA (rheumatoid arthritis)   . PUD (peptic ulcer disease)   . GI bleed 2005  . GERD (gastroesophageal reflux disease)   . Hiatal hernia   . Hypothyroidism   . Osteoporosis   . Depression   . Anxiety   . Coronary artery disease     a.  2012 s/p NSTEMI/Cath: 95% apical LAD dzs, otw nonobs dzs-->Med Rx;  b. 05/2011 MV: no ischemia, EF 79%, inf infarct- attenuation.  . CVA (cerebral vascular accident)   . TIA (transient ischemic attack)   . Fall   . DVT of lower extremity (deep venous thrombosis)   . HCAP (healthcare-associated pneumonia) 01/22/2013  . Enteritis due to Clostridium difficile 01/04/2013  . Sepsis 12/19/2012  . Recurrent colitis due to Clostridium difficile 06/02/2012  . Essential hypertension   . Hyperlipidemia   . Fatigue   . Weight loss   . Hemorrhoids   . Malnutrition   . Dyspnea   . Elevated troponin     a. 10/2014 in setting of sepsis/?HCAP.  Marland Kitchen Chronic diastolic CHF (congestive heart failure)     a. 10/2014 Echo: EF 60-65%, mild LVH, grade 1 DD, dynamic obstruction @ rest - peak velocity of 271 cm/sec, peak grad of 49mmHg, PASP 66mmHg.  Marland Kitchen Obstructive hypertrophic cardiomyopathy     a. 10/2014 Echo: EF 60-65%, mild LVH, grade 1 DD, dynamic obstruction @ rest - peak velocity of 271 cm/sec, peak grad of 6mmHg, PASP 38mmHg.   Past Surgical History  Procedure Laterality Date  . Nstemi  06/2010  . Spinal fusion surgery    . Knee surgery    . Cardiac catheterization  SHOWED RUPTURE PLAQUE IN THE LAD. THE LAD IS NONOBSTRUCTIVE WITH ONLY 30-40% STENOSIS  . Back surgery    . Breast surgery  1964    x3  . Ankle surgery    . Colonoscopy     Social History:   reports that she has never smoked. She has never used smokeless tobacco. She reports that she does not drink alcohol or use illicit drugs.  Family History  Problem Relation Age of Onset  . Kidney disease Mother   . Kidney disease Brother   . Lung cancer Father     Medications: Patient's Medications  New Prescriptions   No medications on file  Previous Medications   ALBUTEROL (PROVENTIL HFA;VENTOLIN HFA) 108 (90 BASE) MCG/ACT INHALER    Inhale 1 puff into the lungs every 6 (six) hours as needed for wheezing or shortness of breath.   ALBUTEROL  (PROVENTIL) (2.5 MG/3ML) 0.083% NEBULIZER SOLUTION    Take 2.5 mg by nebulization every 6 (six) hours as needed for wheezing or shortness of breath (wheezing).   ASPIRIN 81 MG CHEWABLE TABLET    Chew 81 mg by mouth daily.   CALCIUM-VITAMIN D (OSCAL WITH D) 500-200 MG-UNIT PER TABLET    Take 1 tablet by mouth 3 (three) times daily.   CETIRIZINE (ZYRTEC) 10 MG TABLET    Take 10 mg by mouth daily.   CHOLECALCIFEROL (VITAMIN D) 2000 UNITS CAPS    Take 1 capsule (2,000 Units total) by mouth daily.   CRANBERRY 200 MG CAPS    Take 2 capsules by mouth 2 (two) times daily.   ESOMEPRAZOLE (NEXIUM) 40 MG CAPSULE    Take 1 capsule (40 mg total) by mouth 2 (two) times daily before a meal.   FERROUS SULFATE 325 (65 FE) MG TABLET    Take 325 mg by mouth daily with breakfast.   HYDROCODONE-ACETAMINOPHEN (NORCO/VICODIN) 5-325 MG PER TABLET    Take 1 tablet by mouth every 6 (six) hours as needed for severe pain. Take one tablet by mouth every 4 to 6 hours as needed for pain. Do not exceed 4gm of Tylenol in 24 hours   IPRATROPIUM (ATROVENT) 0.03 % NASAL SPRAY    Place 2 sprays into both nostrils every 12 (twelve) hours.   LEVOTHYROXINE (SYNTHROID, LEVOTHROID) 50 MCG TABLET    Take 50 mcg by mouth daily.    LORAZEPAM (ATIVAN) 0.5 MG TABLET    Take one tablet by mouth every night at bedtime for anxiety   METHOCARBAMOL (ROBAXIN) 500 MG TABLET    Take 500 mg by mouth at bedtime.    METOPROLOL TARTRATE (LOPRESSOR) 25 MG TABLET    Take 25 mg by mouth 2 (two) times daily.   MONTELUKAST (SINGULAIR) 10 MG TABLET    Take 10 mg by mouth at bedtime.   MULTIPLE VITAMIN (MULTIVITAMIN) TABLET    Take 1 tablet by mouth daily.     OLOPATADINE (PATANOL) 0.1 % OPHTHALMIC SOLUTION    Place 1 drop into both eyes daily.   OMEGA-3 FATTY ACIDS (FISH OIL) 1200 MG CAPS    Take 1,200 mg by mouth daily.   PREDNISONE (DELTASONE) 5 MG TABLET    2 tablets daily   PROMETHAZINE (PHENERGAN) 25 MG TABLET    Take 25 mg by mouth every 6 (six) hours as  needed for nausea or vomiting.   ROSUVASTATIN (CRESTOR) 20 MG TABLET    Take 20 mg by mouth every evening.    SACCHAROMYCES BOULARDII (FLORASTOR) 250 MG CAPSULE  Take 250 mg by mouth 2 (two) times daily.    SERTRALINE (ZOLOFT) 50 MG TABLET    Take 50 mg by mouth every morning.  Modified Medications   No medications on file  Discontinued Medications   No medications on file     Physical Exam: Filed Vitals:   03/26/15 1551  BP: 156/74  Pulse: 75  Temp: 97.7 F (36.5 C)  TempSrc: Oral  Resp: 20    Physical Exam  Constitutional: No distress.  Frail elderly female  HENT:  Head: Normocephalic and atraumatic.  Neck: Normal range of motion. Neck supple.  Cardiovascular: Normal rate.   Pulmonary/Chest: Effort normal and breath sounds normal.  Abdominal: Soft. Bowel sounds are normal.  Musculoskeletal:  Left shoulder is tender to touch.  No erythema, swelling or bruising.  Normal strength.  Limited range of motion with abduction.  Negative empty can test.   Lymphadenopathy:    She has no cervical adenopathy.  Skin: She is not diaphoretic.    Labs reviewed: Basic Metabolic Panel:  Recent Labs  10/29/14 1659  11/02/14 0846 11/03/14 0350 11/04/14 0519 11/05/14 0830  NA 137  < > 140 136 141  --   K 4.1  < > 3.4* 3.5 3.4* 4.5  CL 104  < > 109 106 107  --   CO2 13*  < > 24 23 25   --   GLUCOSE 352*  < > 89 100* 123*  --   BUN 21*  < > 32* 28* 27*  --   CREATININE 1.80*  < > 1.54* 1.70* 1.72*  --   CALCIUM 9.1  < > 8.0* 7.9* 8.4*  --   MG 1.9  --  1.7 2.0  --   --   PHOS 4.8*  --   --   --   --   --   < > = values in this interval not displayed. Liver Function Tests:  Recent Labs  10/29/14 1029 10/29/14 1659 10/30/14 0414  AST 39 44* 41  ALT 19 23 25   ALKPHOS 47 47 48  BILITOT 1.5* 0.8 0.9  PROT 6.3* 6.3* 6.7  ALBUMIN 3.3* 3.4* 3.3*   No results for input(s): LIPASE, AMYLASE in the last 8760 hours. No results for input(s): AMMONIA in the last 8760  hours. CBC:  Recent Labs  10/29/14 1029  11/01/14 0356 11/02/14 0340 11/02/14 0846 11/03/14 0350  WBC 14.1*  < > 9.7  --  12.0* 11.7*  NEUTROABS 11.4*  --   --   --   --   --   HGB 11.8*  < > 9.8* 9.5* 10.1* 9.5*  HCT 38.2  < > 31.3* 29.5* 31.5* 29.6*  MCV 90.5  < > 91.3  --  89.2 87.8  PLT 110*  < > 106*  --  107* 109*  < > = values in this interval not displayed. TSH:  Recent Labs  05/12/14 07/06/14  TSH 0.66 1.03   A1C: No results found for: HGBA1C Lipid Panel:  Recent Labs  07/13/14  CHOL 161  HDL 52  LDLCALC 59  TRIG 252*    Assessment/Plan 1. Left shoulder pain Will obtain X-ray of left shoulder and radius/ulna to rule out fracture given recent fall. Possible bursitis. Will schedule tylenol 500 mg by mouth TID for 1 week not to exceed 3 gm of tylenol from all sources.  May cont to  take Vicodin PRN for pain, patient has an existing order.  Carlos American. Harle Battiest  [facility] [phone] 8 am - 5 pm) [phone] (after hours)

## 2015-03-27 ENCOUNTER — Encounter: Payer: Self-pay | Admitting: Internal Medicine

## 2015-03-27 ENCOUNTER — Non-Acute Institutional Stay (SKILLED_NURSING_FACILITY): Payer: Medicare Other | Admitting: Internal Medicine

## 2015-03-27 DIAGNOSIS — M25512 Pain in left shoulder: Secondary | ICD-10-CM

## 2015-03-27 DIAGNOSIS — M8580 Other specified disorders of bone density and structure, unspecified site: Secondary | ICD-10-CM | POA: Diagnosis not present

## 2015-03-27 DIAGNOSIS — M069 Rheumatoid arthritis, unspecified: Secondary | ICD-10-CM

## 2015-03-27 DIAGNOSIS — J9610 Chronic respiratory failure, unspecified whether with hypoxia or hypercapnia: Secondary | ICD-10-CM

## 2015-03-27 DIAGNOSIS — N189 Chronic kidney disease, unspecified: Secondary | ICD-10-CM | POA: Diagnosis not present

## 2015-03-27 DIAGNOSIS — M25511 Pain in right shoulder: Secondary | ICD-10-CM

## 2015-03-27 DIAGNOSIS — E785 Hyperlipidemia, unspecified: Secondary | ICD-10-CM | POA: Diagnosis not present

## 2015-03-27 DIAGNOSIS — E2740 Unspecified adrenocortical insufficiency: Secondary | ICD-10-CM

## 2015-03-27 DIAGNOSIS — E039 Hypothyroidism, unspecified: Secondary | ICD-10-CM | POA: Diagnosis not present

## 2015-03-27 DIAGNOSIS — I5032 Chronic diastolic (congestive) heart failure: Secondary | ICD-10-CM | POA: Diagnosis not present

## 2015-03-27 DIAGNOSIS — K279 Peptic ulcer, site unspecified, unspecified as acute or chronic, without hemorrhage or perforation: Secondary | ICD-10-CM | POA: Diagnosis not present

## 2015-03-27 DIAGNOSIS — D509 Iron deficiency anemia, unspecified: Secondary | ICD-10-CM

## 2015-03-27 DIAGNOSIS — M159 Polyosteoarthritis, unspecified: Secondary | ICD-10-CM

## 2015-03-27 NOTE — Progress Notes (Signed)
Patient ID: [name], female   DOB: [birth year], 81 y.o.   MRN: [id number]     Facility: [facility]    PCP: [name], MD  Code Status: full code  Allergies  Allergen Reactions  . Biphosphate   . Morphine And Related Other (See Comments)    unknown  . Percocet [Oxycodone-Acetaminophen]     unknown  . Plavix [Clopidogrel Bisulfate] Other (See Comments)    RASH  . Sulfur Other (See Comments)    GI upset    Chief Complaint  Patient presents with  . Medical Management of Chronic Issues    Routine Visit      HPI:  81 year old patient is seen for routine visit. She complaints of pain in her left shoulder and upper back area. She had a fall last week while trying to get up from her toilet seat unassisted and fell on her right side without any apparent injury or ache to the right side. Did not hit her head. Denies any loss of consciousness. She complaints of pain in her left shoulder and left upper back area. Denies any neck pain and ROM of her neck is normal. Denies any other concerns. On chronic prednisone. Breathing has been stable. No urinary complaints.  Review of Systems:  Constitutional: Negative for fever, chills, diaphoresis.  HENT: Negative for headache, congestion, nasal discharge Eyes: Negative for eye pain, blurred vision, double vision and discharge.  Respiratory: Negative for shortness of breath and wheezing.  on o2 Cardiovascular: Negative for chest pain, palpitations, leg swelling.  Gastrointestinal: Negative for heartburn, nausea, vomiting, abdominal pain Genitourinary: Negative for dysuria  Musculoskeletal: positive for fall and back pain Skin: Negative for itching, rash.  Neurological: Negative for dizziness, tingling, focal weakness Psychiatric/Behavioral: Negative for depression   Past Medical History  Diagnosis Date  . Arthritis   . Right ear pain   . Renal insufficiency   . RA (rheumatoid arthritis)   . PUD  (peptic ulcer disease)   . GI bleed 2005  . GERD (gastroesophageal reflux disease)   . Hiatal hernia   . Hypothyroidism   . Osteoporosis   . Depression   . Anxiety   . Coronary artery disease     a. 2012 s/p NSTEMI/Cath: 95% apical LAD dzs, otw nonobs dzs-->Med Rx;  b. 05/2011 MV: no ischemia, EF 79%, inf infarct- attenuation.  . CVA (cerebral vascular accident)   . TIA (transient ischemic attack)   . Fall   . DVT of lower extremity (deep venous thrombosis)   . HCAP (healthcare-associated pneumonia) 01/22/2013  . Enteritis due to Clostridium difficile 01/04/2013  . Sepsis 12/19/2012  . Recurrent colitis due to Clostridium difficile 06/02/2012  . Essential hypertension   . Hyperlipidemia   . Fatigue   . Weight loss   . Hemorrhoids   . Malnutrition   . Dyspnea   . Elevated troponin     a. 10/2014 in setting of sepsis/?HCAP.  Marland Kitchen Chronic diastolic CHF (congestive heart failure)     a. 10/2014 Echo: EF 60-65%, mild LVH, grade 1 DD, dynamic obstruction @ rest - peak velocity of 271 cm/sec, peak grad of 17mmHg, PASP 52mmHg.  Marland Kitchen Obstructive hypertrophic cardiomyopathy     a. 10/2014 Echo: EF 60-65%, mild LVH, grade 1 DD, dynamic obstruction @ rest - peak velocity of 271 cm/sec, peak grad of 26mmHg, PASP 85mmHg.   Past Surgical History  Procedure Laterality Date  . Nstemi  06/2010  . Spinal fusion surgery    .  Knee surgery    . Cardiac catheterization      SHOWED RUPTURE PLAQUE IN THE LAD. THE LAD IS NONOBSTRUCTIVE WITH ONLY 30-40% STENOSIS  . Back surgery    . Breast surgery  1964    x3  . Ankle surgery    . Colonoscopy      Medications: Patient's Medications  New Prescriptions   No medications on file  Previous Medications   ALBUTEROL (PROVENTIL) (2.5 MG/3ML) 0.083% NEBULIZER SOLUTION    Take 2.5 mg by nebulization every 6 (six) hours as needed for wheezing or shortness of breath (wheezing).   ASPIRIN 81 MG CHEWABLE TABLET    Chew 81 mg by mouth daily.   CALCIUM-VITAMIN D (OSCAL  WITH D) 500-200 MG-UNIT PER TABLET    Take 1 tablet by mouth 3 (three) times daily.   CETIRIZINE (ZYRTEC) 10 MG TABLET    Take 10 mg by mouth daily.   CHOLECALCIFEROL (VITAMIN D) 2000 UNITS CAPS    Take 1 capsule (2,000 Units total) by mouth daily.   CRANBERRY 200 MG CAPS    Take 2 capsules by mouth 2 (two) times daily.   ESOMEPRAZOLE (NEXIUM) 40 MG CAPSULE    Take 1 capsule (40 mg total) by mouth 2 (two) times daily before a meal.   FERROUS SULFATE 325 (65 FE) MG TABLET    Take 325 mg by mouth daily with breakfast.   HYDROCODONE-ACETAMINOPHEN (NORCO/VICODIN) 5-325 MG PER TABLET    Take 1 tablet by mouth every 6 (six) hours as needed for severe pain. Take one tablet by mouth every 4 to 6 hours as needed for pain. Do not exceed 4gm of Tylenol in 24 hours   IPRATROPIUM (ATROVENT) 0.03 % NASAL SPRAY    Place 2 sprays into both nostrils every 12 (twelve) hours.   LEVOTHYROXINE (SYNTHROID, LEVOTHROID) 50 MCG TABLET    Take 50 mcg by mouth daily.    LORAZEPAM (ATIVAN) 0.5 MG TABLET    Take one tablet by mouth every night at bedtime for anxiety   METHOCARBAMOL (ROBAXIN) 500 MG TABLET    Take 500 mg by mouth at bedtime.    METOPROLOL TARTRATE (LOPRESSOR) 25 MG TABLET    Take 25 mg by mouth 2 (two) times daily.   MONTELUKAST (SINGULAIR) 10 MG TABLET    Take 10 mg by mouth at bedtime.   MULTIPLE VITAMIN (MULTIVITAMIN) TABLET    Take 1 tablet by mouth daily.     OLOPATADINE (PATANOL) 0.1 % OPHTHALMIC SOLUTION    Place 1 drop into both eyes daily.   OMEGA-3 FATTY ACIDS (FISH OIL) 1200 MG CAPS    Take 1,200 mg by mouth daily.   PREDNISONE (DELTASONE) 5 MG TABLET    2 tablets daily   ROSUVASTATIN (CRESTOR) 20 MG TABLET    Take 20 mg by mouth every evening.    SACCHAROMYCES BOULARDII (FLORASTOR) 250 MG CAPSULE    Take 250 mg by mouth 2 (two) times daily.    SERTRALINE (ZOLOFT) 50 MG TABLET    Take 50 mg by mouth every morning.  Modified Medications   No medications on file  Discontinued Medications    ALBUTEROL (PROVENTIL HFA;VENTOLIN HFA) 108 (90 BASE) MCG/ACT INHALER    Inhale 1 puff into the lungs every 6 (six) hours as needed for wheezing or shortness of breath.   PROMETHAZINE (PHENERGAN) 25 MG TABLET    Take 25 mg by mouth every 6 (six) hours as needed for nausea or vomiting.  Physical Exam: Filed Vitals:   03/27/15 1551  BP: 139/67  Pulse: 91  Temp: 97.7 F (36.5 C)  TempSrc: Oral  Resp: 18  SpO2: 94%   Constitutional: elderly female in no acute distress.   HEENT: normocephalic, atraumatic. No icterus or pallor Neck: Neck supple. No JVD present. No cervical spine tenderness Cardiovascular: Normal rate, regular rhythm. S1, S2, no murmurs, rubs, or gallops. Intact distal pulses.   Respiratory: Effort normal and breath sounds normal. No respiratory distress. She has no wheezes/ rhonchi or crackles  GI: Soft. Bowel sounds are normal. She exhibits no distension. There is no tenderness.  Musculoskeletal:  She exhibits no edema. ROM in left shoulder limited with some left paraspinal thoracic tenderness. Neurological: She is alert and oriented to person, place, and time.  Skin: Skin is warm and dry. She is not diaphoretic.  Psychiatric: She has a normal mood and affect.    Labs reviewed: Basic Metabolic Panel:  Recent Labs  10/29/14 1659  11/02/14 0846 11/03/14 0350 11/04/14 0519 11/05/14 0830 03/12/15  NA 137  < > 140 136 141  --  146  K 4.1  < > 3.4* 3.5 3.4* 4.5 4.5  CL 104  < > 109 106 107  --   --   CO2 13*  < > 24 23 25   --   --   GLUCOSE 352*  < > 89 100* 123*  --   --   BUN 21*  < > 32* 28* 27*  --  55*  CREATININE 1.80*  < > 1.54* 1.70* 1.72*  --  2.4*  CALCIUM 9.1  < > 8.0* 7.9* 8.4*  --   --   MG 1.9  --  1.7 2.0  --   --   --   PHOS 4.8*  --   --   --   --   --   --   < > = values in this interval not displayed. Liver Function Tests:  Recent Labs  10/29/14 1029 10/29/14 1659 10/30/14 0414  AST 39 44* 41  ALT 19 23 25   ALKPHOS 47 47 48    BILITOT 1.5* 0.8 0.9  PROT 6.3* 6.3* 6.7  ALBUMIN 3.3* 3.4* 3.3*   No results for input(s): LIPASE, AMYLASE in the last 8760 hours. No results for input(s): AMMONIA in the last 8760 hours. CBC:  Recent Labs  10/29/14 1029  11/01/14 0356  11/02/14 0846 11/03/14 0350 03/12/15  WBC 14.1*  < > 9.7  --  12.0* 11.7* 6.5  NEUTROABS 11.4*  --   --   --   --   --   --   HGB 11.8*  < > 9.8*  < > 10.1* 9.5* 8.7*  HCT 38.2  < > 31.3*  < > 31.5* 29.6* 29*  MCV 90.5  < > 91.3  --  89.2 87.8  --   PLT 110*  < > 106*  --  107* 109* 245  < > = values in this interval not displayed. Cardiac Enzymes:  Recent Labs  10/31/14 0500 10/31/14 1056 10/31/14 1714  TROPONINI 0.56* 0.47* 0.38*   BNP: Invalid input(s): POCBNP CBG:  Recent Labs  11/04/14 1641 11/05/14 0747 11/06/14 0751  GLUCAP 210* 148* 112*   Radiological exam 10/30/14: 2D Echo - Left ventricle: The cavity size was normal. Wall thickness was increased in a pattern of mild LVH. Systolic function was normal. The estimated ejection fraction was in the range of 60% to 65%. There  was dynamic obstruction at rest, with a peak velocity of 271 cm/sec and a peak gradient of 29 mm Hg. There was dynamic obstruction. Wall motion was normal; there were no regional wall motion abnormalities. Doppler parameters are consistent with abnormal left ventricular relaxation (grade 1 diastolic dysfunction). - Pulmonary arteries: PA peak pressure: 47 mm Hg   10/30/14: Chest CT Bronchitic changes with dependent atelectasis in both lower lobes. Atelectasis versus infiltrate in posterior aspect of RIGHT upper Lobe  11/02/14: CXR Bilateral infiltrates unchanged.  Possible pneumonia.  03/26/15 xray left shoulder, elbow, forearm and wrist Osteopenia, no acute fracture/ dislocation, posttraumatic deformity of humeral neck, moderate degenerative changes of glenohumeral joint, chronic rotator cuff pathology   Assessment/Plan  Shoulder pain From OA and  chronic rotator cuff injury changes. Avoid NSAIDs with recent gi bleed. shcedule norco to 5-325 mg bid with norco 5-325 1 tab q 8h prn for breakthrough and schedule orthopedic appointment for further assessment. Change robaxin to 500 mg bid x 5 days, then daily at bedtime only  OA Continue oscal with vitamin d and prn norco as above  Osteopenia With her on prednisone and osteopenia seen on xray will need to start medication to help prevent progression of bone loss. Allergic to bisphosphonates. Has renal impairment. Start prolia 60 mg [place] every 6 month and reassess  Iron deficiency anemia Continue ferrous sulfate but with decrease in Hb  And recent gi bleed, increase to tid for now. Check cbc in 2 weeks.  PUD Continue nexium 40 mg bid. Reflux symptoms controlled  Hyperlipidemia Lipid Panel     Component Value Date/Time   CHOL 161 07/13/2014   TRIG 252* 07/13/2014   HDL 52 07/13/2014   CHOLHDL 2.9 12/02/2011 0655   VLDL 56* 12/02/2011 0655   LDLCALC 59 07/13/2014   LDLDIRECT 60.7 11/13/2010 1130  continue crestor 20 mg daily and check lipid panel  Hypothyroidism Continue levothyroxine 50 mcg daily, check tsh Lab Results  Component Value Date   TSH 1.03 07/06/2014    Chronic respiratory failure On o2 used prn. Continue prn proventil with scheduled atrovent and singulair  chronic diastolic heart failure Appears euvolemic on exam. EF 60-65% in recent Echo. Monitor her weight. Continue lopressor 25 mg bid for now.   Adrenal insufficiency Continue her prednisone 10 mg daily for now and monitor  RA No recent flare up. Continue prednisone 10 mg daily, norco 5-325 mg 1 tab q6h prn pain  ckd Monitor renal function    [name], MD  [facility] [phone] (Monday-Friday 8 am - 5 pm) [phone] (afterhours)

## 2015-03-28 ENCOUNTER — Other Ambulatory Visit: Payer: Self-pay

## 2015-03-28 DIAGNOSIS — E785 Hyperlipidemia, unspecified: Secondary | ICD-10-CM | POA: Diagnosis not present

## 2015-03-28 DIAGNOSIS — M8000XA Age-related osteoporosis with current pathological fracture, unspecified site, initial encounter for fracture: Secondary | ICD-10-CM | POA: Diagnosis not present

## 2015-03-28 LAB — LIPID PANEL
CHOLESTEROL: 115 mg/dL (ref 0–200)
HDL: 47 mg/dL (ref 35–70)
LDL Cholesterol: 41 mg/dL
Triglycerides: 133 mg/dL (ref 40–160)

## 2015-03-28 LAB — CBC AND DIFFERENTIAL
HEMATOCRIT: 34 % — AB (ref 36–46)
Hemoglobin: 10.8 g/dL — AB (ref 12.0–16.0)
PLATELETS: 174 10*3/uL (ref 150–399)
WBC: 11.6 10^3/mL

## 2015-03-28 LAB — TSH: TSH: 0.67 u[IU]/mL (ref 0.41–5.90)

## 2015-03-28 MED ORDER — HYDROCODONE-ACETAMINOPHEN 5-325 MG PO TABS
ORAL_TABLET | ORAL | Status: DC
Start: 2015-03-28 — End: 2015-04-27

## 2015-03-28 NOTE — Telephone Encounter (Signed)
Rx faxed to [facility] @ [phone], phone number [phone]  

## 2015-04-03 ENCOUNTER — Ambulatory Visit (INDEPENDENT_AMBULATORY_CARE_PROVIDER_SITE_OTHER): Payer: Medicare Other | Admitting: Interventional Cardiology

## 2015-04-03 ENCOUNTER — Encounter: Payer: Self-pay | Admitting: Interventional Cardiology

## 2015-04-03 ENCOUNTER — Ambulatory Visit: Payer: Medicare Other | Admitting: Interventional Cardiology

## 2015-04-03 VITALS — BP 138/76 | HR 86 | Ht 65.0 in | Wt 150.0 lb

## 2015-04-03 DIAGNOSIS — M19012 Primary osteoarthritis, left shoulder: Secondary | ICD-10-CM | POA: Diagnosis not present

## 2015-04-03 DIAGNOSIS — I5032 Chronic diastolic (congestive) heart failure: Secondary | ICD-10-CM | POA: Diagnosis not present

## 2015-04-03 DIAGNOSIS — I421 Obstructive hypertrophic cardiomyopathy: Secondary | ICD-10-CM

## 2015-04-03 DIAGNOSIS — I10 Essential (primary) hypertension: Secondary | ICD-10-CM

## 2015-04-03 DIAGNOSIS — E785 Hyperlipidemia, unspecified: Secondary | ICD-10-CM

## 2015-04-03 DIAGNOSIS — I2510 Atherosclerotic heart disease of native coronary artery without angina pectoris: Secondary | ICD-10-CM

## 2015-04-03 DIAGNOSIS — M546 Pain in thoracic spine: Secondary | ICD-10-CM | POA: Diagnosis not present

## 2015-04-03 NOTE — Progress Notes (Signed)
Cardiology Office Note   Date:  04/03/2015   ID:  [name], DOB Nov 14, 1929, MRN [id number]  PCP:  [name], MD  Cardiologist:  Sinclair Grooms, MD   Chief Complaint  Patient presents with  . Congestive Heart Failure      History of Present Illness: [name] is a 81 y.o. female who presents for hypertrophic cardiomyopathy, chronic diastolic heart failure, CAD, hyperlipidemia, and essential hypertension.  [name] is doing well. She denies lower extremity edema. No chest discomfort other CV complaints. She notices mild lower extremity edema. She is at [facility] and not very happy there but has no alternative.    Past Medical History  Diagnosis Date  . Arthritis   . Right ear pain   . Renal insufficiency   . RA (rheumatoid arthritis) ([place])   . PUD (peptic ulcer disease)   . GI bleed 2005  . GERD (gastroesophageal reflux disease)   . Hiatal hernia   . Hypothyroidism   . Osteoporosis   . Depression   . Anxiety   . Coronary artery disease     a. 2012 s/p NSTEMI/Cath: 95% apical LAD dzs, otw nonobs dzs-->Med Rx;  b. 05/2011 MV: no ischemia, EF 79%, inf infarct- attenuation.  . CVA (cerebral vascular accident) ([place])   . TIA (transient ischemic attack)   . Fall   . DVT of lower extremity (deep venous thrombosis) ([place])   . HCAP (healthcare-associated pneumonia) 01/22/2013  . Enteritis due to Clostridium difficile 01/04/2013  . Sepsis ([place]) 12/19/2012  . Recurrent colitis due to Clostridium difficile 06/02/2012  . Essential hypertension   . Hyperlipidemia   . Fatigue   . Weight loss   . Hemorrhoids   . Malnutrition ([place])   . Dyspnea   . Elevated troponin     a. 10/2014 in setting of sepsis/?HCAP.  Marland Kitchen Chronic diastolic CHF (congestive heart failure) ([place])     a. 10/2014 Echo: EF 60-65%, mild LVH, grade 1 DD, dynamic obstruction @ rest - peak velocity of 271 cm/sec, peak grad of 65mmHg, PASP 66mmHg.  Marland Kitchen Obstructive hypertrophic cardiomyopathy ([place])    a. 10/2014 Echo: EF 60-65%, mild LVH, grade 1 DD, dynamic obstruction @ rest - peak velocity of 271 cm/sec, peak grad of 32mmHg, PASP 80mmHg.    Past Surgical History  Procedure Laterality Date  . Nstemi  06/2010  . Spinal fusion surgery    . Knee surgery    . Cardiac catheterization      SHOWED RUPTURE PLAQUE IN THE LAD. THE LAD IS NONOBSTRUCTIVE WITH ONLY 30-40% STENOSIS  . Back surgery    . Breast surgery  1964    x3  . Ankle surgery    . Colonoscopy       Current Outpatient Prescriptions  Medication Sig Dispense Refill  . albuterol (PROVENTIL) (2.5 MG/3ML) 0.083% nebulizer solution Take 2.5 mg by nebulization every 6 (six) hours as needed for wheezing or shortness of breath (wheezing).    Marland Kitchen aspirin 81 MG chewable tablet Chew 81 mg by mouth daily.    . calcium-vitamin D (OSCAL WITH D) 500-200 MG-UNIT per tablet Take 1 tablet by mouth 3 (three) times daily.    . cetirizine (ZYRTEC) 10 MG tablet Take 10 mg by mouth daily.    . Cholecalciferol (VITAMIN D) 2000 UNITS CAPS Take 1 capsule (2,000 Units total) by mouth daily. 30 capsule 11  . Cranberry 200 MG CAPS Take 2 capsules by mouth 2 (two) times daily.    Marland Kitchen  esomeprazole (NEXIUM) 40 MG capsule Take 1 capsule (40 mg total) by mouth 2 (two) times daily before a meal.    . ferrous sulfate 325 (65 FE) MG tablet Take 325 mg by mouth daily with breakfast.    . HYDROcodone-acetaminophen (NORCO/VICODIN) 5-325 MG tablet 1 by mouth twice daily scheduled, 1 by mouth every 8 hours as needed,  HOLD FOR SEDATION Do not exceed 4gm of Tylenol in 24 hours 90 tablet 0  . ipratropium (ATROVENT) 0.03 % nasal spray Place 2 sprays into both nostrils every 12 (twelve) hours.    Marland Kitchen levothyroxine (SYNTHROID, LEVOTHROID) 50 MCG tablet Take 50 mcg by mouth daily.     Marland Kitchen LORazepam (ATIVAN) 0.5 MG tablet Take one tablet by mouth every night at bedtime for anxiety 30 tablet 5  . methocarbamol (ROBAXIN) 500 MG tablet Take 500 mg by mouth at bedtime.     . metoprolol  tartrate (LOPRESSOR) 25 MG tablet Take 25 mg by mouth 2 (two) times daily.    . montelukast (SINGULAIR) 10 MG tablet Take 10 mg by mouth at bedtime.    . Multiple Vitamin (MULTIVITAMIN) tablet Take 1 tablet by mouth daily.      Marland Kitchen olopatadine (PATANOL) 0.1 % ophthalmic solution Place 1 drop into both eyes daily.    . Omega-3 Fatty Acids (FISH OIL) 1200 MG CAPS Take 1,200 mg by mouth daily.    . predniSONE (DELTASONE) 5 MG tablet Take 10 mg by mouth daily.    . rosuvastatin (CRESTOR) 20 MG tablet Take 20 mg by mouth every evening.     . saccharomyces boulardii (FLORASTOR) 250 MG capsule Take 250 mg by mouth 2 (two) times daily.     . sertraline (ZOLOFT) 50 MG tablet Take 50 mg by mouth every morning.    . [DISCONTINUED] enoxaparin (LOVENOX) 30 MG/0.3ML injection Inject 30 mg into the skin every 12 (twelve) hours.     No current facility-administered medications for this visit.    Allergies:   Biphosphate; Morphine and related; Percocet; Plavix; and Sulfur    Social History:  The patient  reports that she has never smoked. She has never used smokeless tobacco. She reports that she does not drink alcohol or use illicit drugs.   Family History:  The patient's family history includes Kidney disease in her brother and mother; Lung cancer in her father.    ROS:  Please see the history of present illness.   Otherwise, review of systems are positive for poor appetite.   All other systems are reviewed and negative.    PHYSICAL EXAM: VS:  BP 138/76 mmHg  Pulse 86  Ht 5\' 5"  (1.651 m)  Wt 68.04 kg (150 lb)  BMI 24.96 kg/m2  LMP  (LMP Unknown) , BMI Body mass index is 24.96 kg/(m^2). GEN: Well nourished, well developed, in no acute distress HEENT: normal Neck: no JVD, carotid bruits, or masses Cardiac: RRR.  There is no murmur, rub, or gallop. There is trace edema. Respiratory:  clear to auscultation bilaterally, normal work of breathing. GI: soft, nontender, nondistended, + BS MS: no  deformity or atrophy Skin: warm and dry, no rash Neuro:  Strength and sensation are intact Psych: euthymic mood, full affect   EKG:  EKG is not ordered today.   Recent Labs: 07/06/2014: TSH 1.03 10/29/2014: B Natriuretic Peptide 1045.3* 10/30/2014: ALT 25 11/03/2014: Magnesium 2.0 03/12/2015: BUN 55*; Creatinine 2.4*; Hemoglobin 8.7*; Platelets 245; Potassium 4.5; Sodium 146    Lipid Panel  Component Value Date/Time   CHOL 161 07/13/2014   TRIG 252* 07/13/2014   HDL 52 07/13/2014   CHOLHDL 2.9 12/02/2011 0655   VLDL 56* 12/02/2011 0655   LDLCALC 59 07/13/2014   LDLDIRECT 60.7 11/13/2010 1130      Wt Readings from Last 3 Encounters:  04/03/15 68.04 kg (150 lb)  02/26/15 67.949 kg (149 lb 12.8 oz)  01/17/15 67.132 kg (148 lb)      Other studies Reviewed: Additional studies/ records that were reviewed today include: None.    ASSESSMENT AND PLAN:  1. Coronary artery disease involving native coronary artery of native heart without angina pectoris  asymptomatic  2. Chronic diastolic CHF (congestive heart failure) (HCC)  no evidence of volume overload and asymptomatic  3. Obstructive hypertrophic cardiomyopathy (H[facility])  asymptomatic  4. Essential hypertension  controlled  5. Hyperlipidemia  on therapy    Current medicines are reviewed at length with the patient today.  The patient has the following concerns regarding medicines: None.  The following changes/actions have been instituted:     no change in the current regimenbs/   Labs /  tests ordered today include:  No orders of the defined types were placed in this encounter.     Disposition:   FU with HS in 9 months  Signed, Sinclair Grooms, MD  04/03/2015 5:34 PM    [address], [place],   [address] Phone: [phone]; Fax: [phone]

## 2015-04-03 NOTE — Patient Instructions (Signed)
Medication Instructions:  Your physician recommends that you continue on your current medications as directed. Please refer to the Current Medication list given to you today.   Labwork: None ordered  Testing/Procedures: None ordered  Follow-Up: Your physician wants you to follow-up in: 6-9 months with Dr.Smith You will receive a reminder letter in the mail two months in advance. If you don't receive a letter, please call our office to schedule the follow-up appointment.   Any Other Special Instructions Will Be Listed Below (If Applicable).

## 2015-04-04 ENCOUNTER — Non-Acute Institutional Stay (SKILLED_NURSING_FACILITY): Payer: Medicare Other | Admitting: Nurse Practitioner

## 2015-04-04 DIAGNOSIS — M546 Pain in thoracic spine: Secondary | ICD-10-CM

## 2015-04-04 DIAGNOSIS — M25512 Pain in left shoulder: Secondary | ICD-10-CM | POA: Diagnosis not present

## 2015-04-04 MED ORDER — LIDOCAINE 5 % EX PTCH: 1.0000 | MEDICATED_PATCH | CUTANEOUS | Status: AC

## 2015-04-04 NOTE — Progress Notes (Signed)
Patient ID: [name], female   DOB: 10/27/1929, 81 y.o.   MRN: [id number] Patient ID: [name], female   DOB: [birth year], 81 y.o.   MRN: [id number]    Nursing Home Location:  [place] of Service: SNF (31)  PCP: [name], MD  Allergies  Allergen Reactions  . Biphosphate   . Morphine And Related Other (See Comments)    unknown  . Percocet [Oxycodone-Acetaminophen]     unknown  . Plavix [Clopidogrel Bisulfate] Other (See Comments)    RASH  . Sulfur Other (See Comments)    GI upset    Chief Complaint  Patient presents with  . Acute Visit    ongoing back pain    HPI:  Patient is a 81 y.o. female seen today at [facility] at patient request. Pt with a hx of back and shoulder pain. Pt was seen by orthopedic office yesterday and given injection to left shoulder due to OA of left shoulder (possible rotator cuff arthropathy). Pt reports pain in shoulder with minimal improvement today and worsening pain of t-spine. X-rays obtained at orthopedic office and were without change from previous xrays in 2014. Pt request pain patch to back to help with pain. Currently taking norco q 8 hours scheduled.   Review of Systems:  Review of Systems  Constitutional: Negative for fever, activity change and fatigue.  Respiratory: Negative for cough and shortness of breath.   Cardiovascular: Negative for chest pain and palpitations.  Musculoskeletal: Positive for back pain and arthralgias. Negative for myalgias, joint swelling, neck pain and neck stiffness.  Skin: Negative for color change, pallor, rash and wound.  Neurological: Negative for dizziness, weakness (no increased weakness), light-headedness, numbness and headaches.    Past Medical History  Diagnosis Date  . Arthritis   . Right ear pain   . Renal insufficiency   . RA (rheumatoid arthritis) ([place])   . PUD (peptic ulcer disease)   . GI bleed 2005  . GERD (gastroesophageal reflux  disease)   . Hiatal hernia   . Hypothyroidism   . Osteoporosis   . Depression   . Anxiety   . Coronary artery disease     a. 2012 s/p NSTEMI/Cath: 95% apical LAD dzs, otw nonobs dzs-->Med Rx;  b. 05/2011 MV: no ischemia, EF 79%, inf infarct- attenuation.  . CVA (cerebral vascular accident) ([place])   . TIA (transient ischemic attack)   . Fall   . DVT of lower extremity (deep venous thrombosis) ([place])   . HCAP (healthcare-associated pneumonia) 01/22/2013  . Enteritis due to Clostridium difficile 01/04/2013  . Sepsis ([place]) 12/19/2012  . Recurrent colitis due to Clostridium difficile 06/02/2012  . Essential hypertension   . Hyperlipidemia   . Fatigue   . Weight loss   . Hemorrhoids   . Malnutrition ([place])   . Dyspnea   . Elevated troponin     a. 10/2014 in setting of sepsis/?HCAP.  Marland Kitchen Chronic diastolic CHF (congestive heart failure) ([place])     a. 10/2014 Echo: EF 60-65%, mild LVH, grade 1 DD, dynamic obstruction @ rest - peak velocity of 271 cm/sec, peak grad of 20mmHg, PASP 80mmHg.  Marland Kitchen Obstructive hypertrophic cardiomyopathy ([place])     a. 10/2014 Echo: EF 60-65%, mild LVH, grade 1 DD, dynamic obstruction @ rest - peak velocity of 271 cm/sec, peak grad of 74mmHg, PASP 18mmHg.   Past Surgical History  Procedure Laterality Date  . Nstemi  06/2010  .  Spinal fusion surgery    . Knee surgery    . Cardiac catheterization      SHOWED RUPTURE PLAQUE IN THE LAD. THE LAD IS NONOBSTRUCTIVE WITH ONLY 30-40% STENOSIS  . Back surgery    . Breast surgery  1964    x3  . Ankle surgery    . Colonoscopy     Social History:   reports that she has never smoked. She has never used smokeless tobacco. She reports that she does not drink alcohol or use illicit drugs.  Family History  Problem Relation Age of Onset  . Kidney disease Mother   . Kidney disease Brother   . Lung cancer Father     Medications: Patient's Medications  New Prescriptions   No medications on file  Previous Medications   ALBUTEROL  (PROVENTIL) (2.5 MG/3ML) 0.083% NEBULIZER SOLUTION    Take 2.5 mg by nebulization every 6 (six) hours as needed for wheezing or shortness of breath (wheezing).   ASPIRIN 81 MG CHEWABLE TABLET    Chew 81 mg by mouth daily.   CALCIUM-VITAMIN D (OSCAL WITH D) 500-200 MG-UNIT PER TABLET    Take 1 tablet by mouth 3 (three) times daily.   CETIRIZINE (ZYRTEC) 10 MG TABLET    Take 10 mg by mouth daily.   CHOLECALCIFEROL (VITAMIN D) 2000 UNITS CAPS    Take 1 capsule (2,000 Units total) by mouth daily.   CRANBERRY 200 MG CAPS    Take 2 capsules by mouth 2 (two) times daily.   ESOMEPRAZOLE (NEXIUM) 40 MG CAPSULE    Take 1 capsule (40 mg total) by mouth 2 (two) times daily before a meal.   FERROUS SULFATE 325 (65 FE) MG TABLET    Take 325 mg by mouth daily with breakfast.   HYDROCODONE-ACETAMINOPHEN (NORCO/VICODIN) 5-325 MG TABLET    1 by mouth twice daily scheduled, 1 by mouth every 8 hours as needed,  HOLD FOR SEDATION Do not exceed 4gm of Tylenol in 24 hours   IPRATROPIUM (ATROVENT) 0.03 % NASAL SPRAY    Place 2 sprays into both nostrils every 12 (twelve) hours.   LEVOTHYROXINE (SYNTHROID, LEVOTHROID) 50 MCG TABLET    Take 50 mcg by mouth daily.    LORAZEPAM (ATIVAN) 0.5 MG TABLET    Take one tablet by mouth every night at bedtime for anxiety   METHOCARBAMOL (ROBAXIN) 500 MG TABLET    Take 500 mg by mouth at bedtime.    METOPROLOL TARTRATE (LOPRESSOR) 25 MG TABLET    Take 25 mg by mouth 2 (two) times daily.   MONTELUKAST (SINGULAIR) 10 MG TABLET    Take 10 mg by mouth at bedtime.   MULTIPLE VITAMIN (MULTIVITAMIN) TABLET    Take 1 tablet by mouth daily.     OLOPATADINE (PATANOL) 0.1 % OPHTHALMIC SOLUTION    Place 1 drop into both eyes daily.   OMEGA-3 FATTY ACIDS (FISH OIL) 1200 MG CAPS    Take 1,200 mg by mouth daily.   PREDNISONE (DELTASONE) 5 MG TABLET    Take 10 mg by mouth daily.   ROSUVASTATIN (CRESTOR) 20 MG TABLET    Take 20 mg by mouth every evening.    SACCHAROMYCES BOULARDII (FLORASTOR) 250 MG  CAPSULE    Take 250 mg by mouth 2 (two) times daily.    SERTRALINE (ZOLOFT) 50 MG TABLET    Take 50 mg by mouth every morning.  Modified Medications   No medications on file  Discontinued Medications   No medications  on file     Physical Exam: Filed Vitals:   04/04/15 1411  BP: 127/74  Pulse: 74  Temp: 98.1 F (36.7 C)  Resp: 18    Physical Exam  Constitutional: No distress.  Frail elderly female  HENT:  Head: Normocephalic and atraumatic.  Neck: Normal range of motion. Neck supple.  Cardiovascular: Normal rate.   Pulmonary/Chest: Effort normal and breath sounds normal.  Musculoskeletal: She exhibits tenderness (to t spine).  no erythema, swelling or bruising no spine.  Left shouder with limited ROM    Neurological: She is alert.  Skin: She is not diaphoretic.    Labs reviewed: Basic Metabolic Panel:  Recent Labs  10/29/14 1659  11/02/14 0846 11/03/14 0350 11/04/14 0519 11/05/14 0830 03/12/15  NA 137  < > 140 136 141  --  146  K 4.1  < > 3.4* 3.5 3.4* 4.5 4.5  CL 104  < > 109 106 107  --   --   CO2 13*  < > 24 23 25   --   --   GLUCOSE 352*  < > 89 100* 123*  --   --   BUN 21*  < > 32* 28* 27*  --  55*  CREATININE 1.80*  < > 1.54* 1.70* 1.72*  --  2.4*  CALCIUM 9.1  < > 8.0* 7.9* 8.4*  --   --   MG 1.9  --  1.7 2.0  --   --   --   PHOS 4.8*  --   --   --   --   --   --   < > = values in this interval not displayed. Liver Function Tests:  Recent Labs  10/29/14 1029 10/29/14 1659 10/30/14 0414  AST 39 44* 41  ALT 19 23 25   ALKPHOS 47 47 48  BILITOT 1.5* 0.8 0.9  PROT 6.3* 6.3* 6.7  ALBUMIN 3.3* 3.4* 3.3*   No results for input(s): LIPASE, AMYLASE in the last 8760 hours. No results for input(s): AMMONIA in the last 8760 hours. CBC:  Recent Labs  10/29/14 1029  11/01/14 0356  11/02/14 0846 11/03/14 0350 03/12/15  WBC 14.1*  < > 9.7  --  12.0* 11.7* 6.5  NEUTROABS 11.4*  --   --   --   --   --   --   HGB 11.8*  < > 9.8*  < > 10.1* 9.5* 8.7*    HCT 38.2  < > 31.3*  < > 31.5* 29.6* 29*  MCV 90.5  < > 91.3  --  89.2 87.8  --   PLT 110*  < > 106*  --  107* 109* 245  < > = values in this interval not displayed. TSH:  Recent Labs  05/12/14 07/06/14  TSH 0.66 1.03   A1C: No results found for: HGBA1C Lipid Panel:  Recent Labs  07/13/14  CHOL 161  HDL 52  LDLCALC 59  TRIG 252*    Assessment/Plan 1. Left shoulder pain Injection given yesterday by ortho with minimal relief today. To cont scheduled norco and monitor at this time  2. T-spine tenderness -xrays unchanged from report from ortho -will start Lidoderm 5% patch on for 12 hours daily to affected area -has follow up scheduled with ortho  -to cont scheduled norco     Chanele Douglas K. Harle Battiest  [facility] [phone] 8 am - 5 pm) [phone] (after hours)

## 2015-04-10 DIAGNOSIS — Z23 Encounter for immunization: Secondary | ICD-10-CM | POA: Diagnosis not present

## 2015-04-11 DIAGNOSIS — M159 Polyosteoarthritis, unspecified: Secondary | ICD-10-CM | POA: Insufficient documentation

## 2015-04-11 DIAGNOSIS — M8580 Other specified disorders of bone density and structure, unspecified site: Secondary | ICD-10-CM | POA: Insufficient documentation

## 2015-04-11 DIAGNOSIS — N183 Chronic kidney disease, stage 3 unspecified: Secondary | ICD-10-CM | POA: Insufficient documentation

## 2015-04-11 DIAGNOSIS — M069 Rheumatoid arthritis, unspecified: Secondary | ICD-10-CM | POA: Insufficient documentation

## 2015-04-11 DIAGNOSIS — D509 Iron deficiency anemia, unspecified: Secondary | ICD-10-CM | POA: Insufficient documentation

## 2015-04-11 DIAGNOSIS — E039 Hypothyroidism, unspecified: Secondary | ICD-10-CM | POA: Insufficient documentation

## 2015-04-16 DIAGNOSIS — M79672 Pain in left foot: Secondary | ICD-10-CM | POA: Diagnosis not present

## 2015-04-16 DIAGNOSIS — M79671 Pain in right foot: Secondary | ICD-10-CM | POA: Diagnosis not present

## 2015-04-16 DIAGNOSIS — B351 Tinea unguium: Secondary | ICD-10-CM | POA: Diagnosis not present

## 2015-04-16 DIAGNOSIS — I2510 Atherosclerotic heart disease of native coronary artery without angina pectoris: Secondary | ICD-10-CM | POA: Diagnosis not present

## 2015-04-16 DIAGNOSIS — M546 Pain in thoracic spine: Secondary | ICD-10-CM | POA: Diagnosis not present

## 2015-04-16 DIAGNOSIS — M542 Cervicalgia: Secondary | ICD-10-CM | POA: Diagnosis not present

## 2015-04-16 DIAGNOSIS — M19012 Primary osteoarthritis, left shoulder: Secondary | ICD-10-CM | POA: Diagnosis not present

## 2015-04-25 ENCOUNTER — Non-Acute Institutional Stay (SKILLED_NURSING_FACILITY): Payer: Medicare Other | Admitting: Nurse Practitioner

## 2015-04-25 DIAGNOSIS — I5032 Chronic diastolic (congestive) heart failure: Secondary | ICD-10-CM

## 2015-04-25 DIAGNOSIS — K219 Gastro-esophageal reflux disease without esophagitis: Secondary | ICD-10-CM | POA: Diagnosis not present

## 2015-04-25 DIAGNOSIS — M1990 Unspecified osteoarthritis, unspecified site: Secondary | ICD-10-CM | POA: Diagnosis not present

## 2015-04-25 DIAGNOSIS — F419 Anxiety disorder, unspecified: Secondary | ICD-10-CM

## 2015-04-25 DIAGNOSIS — I10 Essential (primary) hypertension: Secondary | ICD-10-CM | POA: Diagnosis not present

## 2015-04-25 DIAGNOSIS — M810 Age-related osteoporosis without current pathological fracture: Secondary | ICD-10-CM | POA: Diagnosis not present

## 2015-04-25 NOTE — Progress Notes (Signed)
Nursing Home Location:  [place] of Service: SNF (31)  PCP: [name], MD  Allergies  Allergen Reactions  . Biphosphate   . Morphine And Related Other (See Comments)    unknown  . Percocet [Oxycodone-Acetaminophen]     unknown  . Plavix [Clopidogrel Bisulfate] Other (See Comments)    RASH  . Sulfur Other (See Comments)    GI upset    Chief Complaint  Patient presents with  . Medical Management of Chronic Issues    HPI:  Patient is a 81 y.o. female seen today at [facility] for medical management of her chronic issues.  [name] has a past medical history of RA, depression, HTN, hypothyroidism, CVA, CHF, and CAD.  She is feeling well today, no complaints of pain.  Nursing without any acute concerns.  She is eating well and participating in activities.  In the last month pt has been seen due to arthralgias. Has been followed by ortho and received injection.  Lidoderm patch added and has been beneficial.   Review of Systems:  Review of Systems  Constitutional: Negative for fever, chills and fatigue.  HENT: Negative for congestion, postnasal drip and sinus pressure.   Respiratory: Negative for cough, shortness of breath and wheezing.   Cardiovascular: Negative for chest pain, palpitations and leg swelling.  Gastrointestinal: Negative for abdominal pain, diarrhea and constipation.  Genitourinary: Negative for urgency, frequency and hematuria.  Musculoskeletal: Negative for myalgias, back pain and arthralgias.  Skin: Negative.   Psychiatric/Behavioral: Negative.     Past Medical History  Diagnosis Date  . Arthritis   . Right ear pain   . Renal insufficiency   . RA (rheumatoid arthritis) ([place])   . PUD (peptic ulcer disease)   . GI bleed 2005  . GERD (gastroesophageal reflux disease)   . Hiatal hernia   . Hypothyroidism   . Osteoporosis   . Depression   . Anxiety   . Coronary artery disease     a. 2012 s/p  NSTEMI/Cath: 95% apical LAD dzs, otw nonobs dzs-->Med Rx;  b. 05/2011 MV: no ischemia, EF 79%, inf infarct- attenuation.  . CVA (cerebral vascular accident) ([place])   . TIA (transient ischemic attack)   . Fall   . DVT of lower extremity (deep venous thrombosis) ([place])   . HCAP (healthcare-associated pneumonia) 01/22/2013  . Enteritis due to Clostridium difficile 01/04/2013  . Sepsis ([place]) 12/19/2012  . Recurrent colitis due to Clostridium difficile 06/02/2012  . Essential hypertension   . Hyperlipidemia   . Fatigue   . Weight loss   . Hemorrhoids   . Malnutrition ([place])   . Dyspnea   . Elevated troponin     a. 10/2014 in setting of sepsis/?HCAP.  Marland Kitchen Chronic diastolic CHF (congestive heart failure) ([place])     a. 10/2014 Echo: EF 60-65%, mild LVH, grade 1 DD, dynamic obstruction @ rest - peak velocity of 271 cm/sec, peak grad of 110mmHg, PASP 77mmHg.  Marland Kitchen Obstructive hypertrophic cardiomyopathy ([place])     a. 10/2014 Echo: EF 60-65%, mild LVH, grade 1 DD, dynamic obstruction @ rest - peak velocity of 271 cm/sec, peak grad of 29mmHg, PASP 27mmHg.   Past Surgical History  Procedure Laterality Date  . Nstemi  06/2010  . Spinal fusion surgery    . Knee surgery    . Cardiac catheterization      SHOWED RUPTURE PLAQUE IN THE LAD. THE LAD IS NONOBSTRUCTIVE WITH  ONLY 30-40% STENOSIS  . Back surgery    . Breast surgery  1964    x3  . Ankle surgery    . Colonoscopy     Social History:   reports that she has never smoked. She has never used smokeless tobacco. She reports that she does not drink alcohol or use illicit drugs.  Family History  Problem Relation Age of Onset  . Kidney disease Mother   . Kidney disease Brother   . Lung cancer Father     Medications: Patient's Medications  New Prescriptions   No medications on file  Previous Medications   ALBUTEROL (PROVENTIL) (2.5 MG/3ML) 0.083% NEBULIZER SOLUTION    Take 2.5 mg by nebulization every 6 (six) hours as needed for wheezing or shortness of  breath (wheezing).   ASPIRIN 81 MG CHEWABLE TABLET    Chew 81 mg by mouth daily.   CALCIUM-VITAMIN D (OSCAL WITH D) 500-200 MG-UNIT PER TABLET    Take 1 tablet by mouth 3 (three) times daily.   CETIRIZINE (ZYRTEC) 10 MG TABLET    Take 10 mg by mouth daily.   CHOLECALCIFEROL (VITAMIN D) 2000 UNITS CAPS    Take 1 capsule (2,000 Units total) by mouth daily.   CRANBERRY 200 MG CAPS    Take 2 capsules by mouth 2 (two) times daily.   ESOMEPRAZOLE (NEXIUM) 40 MG CAPSULE    Take 1 capsule (40 mg total) by mouth 2 (two) times daily before a meal.   FERROUS SULFATE 325 (65 FE) MG TABLET    Take 325 mg by mouth daily with breakfast.   HYDROCODONE-ACETAMINOPHEN (NORCO/VICODIN) 5-325 MG TABLET    1 by mouth twice daily scheduled, 1 by mouth every 8 hours as needed,  HOLD FOR SEDATION Do not exceed 4gm of Tylenol in 24 hours   IPRATROPIUM (ATROVENT) 0.03 % NASAL SPRAY    Place 2 sprays into both nostrils every 12 (twelve) hours.   LEVOTHYROXINE (SYNTHROID, LEVOTHROID) 50 MCG TABLET    Take 50 mcg by mouth daily.    LIDOCAINE (LIDODERM) 5 %    Place 1 patch onto the skin daily. Remove & Discard patch within 12 hours or as directed by MD   LORAZEPAM (ATIVAN) 0.5 MG TABLET    Take one tablet by mouth every night at bedtime for anxiety   METHOCARBAMOL (ROBAXIN) 500 MG TABLET    Take 500 mg by mouth at bedtime.    METOPROLOL TARTRATE (LOPRESSOR) 25 MG TABLET    Take 25 mg by mouth 2 (two) times daily.   MONTELUKAST (SINGULAIR) 10 MG TABLET    Take 10 mg by mouth at bedtime.   MULTIPLE VITAMIN (MULTIVITAMIN) TABLET    Take 1 tablet by mouth daily.     OLOPATADINE (PATANOL) 0.1 % OPHTHALMIC SOLUTION    Place 1 drop into both eyes daily.   OMEGA-3 FATTY ACIDS (FISH OIL) 1200 MG CAPS    Take 1,200 mg by mouth daily.   PATADAY 0.2 % SOLN    1 drop daily.   PREDNISONE (DELTASONE) 10 MG TABLET    Take 1 tablet by mouth daily.   PROLIA 60 MG/ML SOLN INJECTION    Inject 60 mLs into the skin.   PROMETHAZINE (PHENERGAN) 25  MG TABLET    Take 25 mg by mouth 2 (two) times daily as needed.   ROSUVASTATIN (CRESTOR) 20 MG TABLET    Take 20 mg by mouth every evening.    SACCHAROMYCES BOULARDII (FLORASTOR) 250 MG CAPSULE  Take 250 mg by mouth 2 (two) times daily.    SERTRALINE (ZOLOFT) 50 MG TABLET    Take 50 mg by mouth every morning.  Modified Medications   No medications on file  Discontinued Medications   PREDNISONE (DELTASONE) 5 MG TABLET    Take 10 mg by mouth daily.     Physical Exam: Filed Vitals:   04/25/15 1345  BP: 110/59  Pulse: 70  Temp: 97.1 F (36.2 C)  Resp: 18  Weight: 149 lb 12.8 oz (67.949 kg)    Physical Exam  Constitutional: She is oriented to person, place, and time. No distress.  Elderly female   HENT:  Head: Normocephalic and atraumatic.  Eyes: Pupils are equal, round, and reactive to light.  Neck: Normal range of motion. Neck supple. No JVD present.  Cardiovascular: Normal rate, regular rhythm, normal heart sounds and intact distal pulses.   No murmur heard. Pulmonary/Chest: Effort normal and breath sounds normal.  Abdominal: Soft. Bowel sounds are normal. There is no tenderness.  Musculoskeletal: Normal range of motion.  Lymphadenopathy:    She has no cervical adenopathy.  Neurological: She is alert and oriented to person, place, and time.  Skin: Skin is warm and dry. She is not diaphoretic.  Psychiatric: She has a normal mood and affect.    Labs reviewed: Basic Metabolic Panel:  Recent Labs  10/29/14 1659  11/02/14 0846 11/03/14 0350 11/04/14 0519 11/05/14 0830 03/12/15 03/20/15  NA 137  < > 140 136 141  --  146 144  K 4.1  < > 3.4* 3.5 3.4* 4.5 4.5 4.2  CL 104  < > 109 106 107  --   --   --   CO2 13*  < > 24 23 25   --   --   --   GLUCOSE 352*  < > 89 100* 123*  --   --   --   BUN 21*  < > 32* 28* 27*  --  55* 34*  CREATININE 1.80*  < > 1.54* 1.70* 1.72*  --  2.4* 1.6*  CALCIUM 9.1  < > 8.0* 7.9* 8.4*  --   --   --   MG 1.9  --  1.7 2.0  --   --   --   --    PHOS 4.8*  --   --   --   --   --   --   --   < > = values in this interval not displayed. Liver Function Tests:  Recent Labs  10/29/14 1029 10/29/14 1659 10/30/14 0414  AST 39 44* 41  ALT 19 23 25   ALKPHOS 47 47 48  BILITOT 1.5* 0.8 0.9  PROT 6.3* 6.3* 6.7  ALBUMIN 3.3* 3.4* 3.3*   No results for input(s): LIPASE, AMYLASE in the last 8760 hours. No results for input(s): AMMONIA in the last 8760 hours. CBC:  Recent Labs  10/29/14 1029  11/01/14 0356  11/02/14 0846 11/03/14 0350 03/12/15 03/20/15 03/28/15  WBC 14.1*  < > 9.7  --  12.0* 11.7* 6.5 10.3 11.6  NEUTROABS 11.4*  --   --   --   --   --   --   --   --   HGB 11.8*  < > 9.8*  < > 10.1* 9.5* 8.7* 10.6* 10.8*  HCT 38.2  < > 31.3*  < > 31.5* 29.6* 29* 35* 34*  MCV 90.5  < > 91.3  --  89.2 87.8  --   --   --  PLT 110*  < > 106*  --  107* 109* 245 157 174  < > = values in this interval not displayed. TSH:  Recent Labs  05/12/14 07/06/14  TSH 0.66 1.03   A1C: No results found for: HGBA1C Lipid Panel:  Recent Labs  07/13/14 03/28/15  CHOL 161 115  HDL 52 47  LDLCALC 59 41  TRIG 252* 133     Assessment/Plan 1.Coronary artery disease Stable. No chest pain. Continue metoprolol.   2. Essential hypertension Stable. Blood pressures within desirable range.  Renal function stable.    3. Chronic diastolic CHF (congestive heart failure) (HCC) Stable. Euvolemic.  No SOB, no JVD. Continue metoprolol and 1500 ml fluid restriction.    4. Gastroesophageal reflux disease without esophagitis Stable.  No complaints. Continue Nexium   5. Osteoporosis Stable.  Continue calcium and vitamin D supplement.   6. Anxiety Stable.  Continue Ativan and Zoloft.   7. Osteoarthritis  -to multiple joints including spine and shoulder, currently pain has been stable, cont on current medications.   [name]. [name]  [facility] [phone] 8 am - 5 pm) [phone] (after  hours)

## 2015-04-27 ENCOUNTER — Other Ambulatory Visit: Payer: Self-pay

## 2015-04-27 MED ORDER — HYDROCODONE-ACETAMINOPHEN 5-325 MG PO TABS: ORAL_TABLET | ORAL | Status: DC

## 2015-05-07 ENCOUNTER — Other Ambulatory Visit: Payer: Self-pay | Admitting: *Deleted

## 2015-05-10 ENCOUNTER — Other Ambulatory Visit: Payer: Self-pay | Admitting: *Deleted

## 2015-05-17 ENCOUNTER — Other Ambulatory Visit: Payer: Self-pay | Admitting: *Deleted

## 2015-05-17 MED ORDER — LORAZEPAM 0.5 MG PO TABS
ORAL_TABLET | ORAL | Status: DC
Start: 2015-05-17 — End: 2015-07-26

## 2015-05-17 NOTE — Telephone Encounter (Signed)
[facility] 

## 2015-06-04 ENCOUNTER — Other Ambulatory Visit: Payer: Self-pay | Admitting: *Deleted

## 2015-06-04 MED ORDER — HYDROCODONE-ACETAMINOPHEN 5-325 MG PO TABS: ORAL_TABLET | ORAL | Status: DC

## 2015-06-04 NOTE — Telephone Encounter (Signed)
[facility] 

## 2015-06-20 ENCOUNTER — Non-Acute Institutional Stay (SKILLED_NURSING_FACILITY): Payer: Medicare Other | Admitting: Nurse Practitioner

## 2015-06-20 DIAGNOSIS — M1990 Unspecified osteoarthritis, unspecified site: Secondary | ICD-10-CM

## 2015-06-20 DIAGNOSIS — E038 Other specified hypothyroidism: Secondary | ICD-10-CM | POA: Diagnosis not present

## 2015-06-20 DIAGNOSIS — I10 Essential (primary) hypertension: Secondary | ICD-10-CM

## 2015-06-20 DIAGNOSIS — F419 Anxiety disorder, unspecified: Secondary | ICD-10-CM

## 2015-06-20 DIAGNOSIS — K649 Unspecified hemorrhoids: Secondary | ICD-10-CM | POA: Diagnosis not present

## 2015-06-20 DIAGNOSIS — M810 Age-related osteoporosis without current pathological fracture: Secondary | ICD-10-CM | POA: Diagnosis not present

## 2015-06-20 NOTE — Progress Notes (Signed)
Nursing Home Location:  [place] of Service: SNF (31)  PCP: [name], MD  Allergies  Allergen Reactions  . Biphosphate   . Morphine And Related Other (See Comments)    unknown  . Percocet [Oxycodone-Acetaminophen]     unknown  . Plavix [Clopidogrel Bisulfate] Other (See Comments)    RASH  . Sulfur Other (See Comments)    GI upset    Chief Complaint  Patient presents with  . Medical Management of Chronic Issues    HPI:  Patient is a 81 y.o. female seen today at [facility] for medical management of her chronic issues. [name] has a past medical history of RA, depression, HTN, hypothyroidism, CVA, CHF, and CAD. She is feeling well today. Reports she needs something for hemorrhoids, no constipation or diarrhea at this time. Pt has been doing well since last visit. No illness or acute issues. Eating well. No worsening of pain.  Nursing without any concerns at this time.  Review of Systems:  Review of Systems  Constitutional: Negative for fever, chills and fatigue.  HENT: Negative for congestion, postnasal drip and sinus pressure.   Respiratory: Negative for cough, shortness of breath and wheezing.   Cardiovascular: Negative for chest pain, palpitations and leg swelling.  Gastrointestinal: Negative for abdominal pain, diarrhea and constipation.  Genitourinary: Negative for urgency, frequency and hematuria.  Musculoskeletal: Positive for arthralgias (to right shoulder and back at time, medication effective). Negative for myalgias and back pain.  Skin: Negative.   Psychiatric/Behavioral: Negative.  Negative for behavioral problems and agitation. The patient is not nervous/anxious.     Past Medical History  Diagnosis Date  . Arthritis   . Right ear pain   . Renal insufficiency   . RA (rheumatoid arthritis) ([place])   . PUD (peptic ulcer disease)   . GI bleed 2005  . GERD (gastroesophageal reflux disease)   . Hiatal  hernia   . Hypothyroidism   . Osteoporosis   . Depression   . Anxiety   . Coronary artery disease     a. 2012 s/p NSTEMI/Cath: 95% apical LAD dzs, otw nonobs dzs-->Med Rx;  b. 05/2011 MV: no ischemia, EF 79%, inf infarct- attenuation.  . CVA (cerebral vascular accident) ([place])   . TIA (transient ischemic attack)   . Fall   . DVT of lower extremity (deep venous thrombosis) ([place])   . HCAP (healthcare-associated pneumonia) 01/22/2013  . Enteritis due to Clostridium difficile 01/04/2013  . Sepsis ([place]) 12/19/2012  . Recurrent colitis due to Clostridium difficile 06/02/2012  . Essential hypertension   . Hyperlipidemia   . Fatigue   . Weight loss   . Hemorrhoids   . Malnutrition ([place])   . Dyspnea   . Elevated troponin     a. 10/2014 in setting of sepsis/?HCAP.  Marland Kitchen Chronic diastolic CHF (congestive heart failure) ([place])     a. 10/2014 Echo: EF 60-65%, mild LVH, grade 1 DD, dynamic obstruction @ rest - peak velocity of 271 cm/sec, peak grad of 39mmHg, PASP 57mmHg.  Marland Kitchen Obstructive hypertrophic cardiomyopathy ([place])     a. 10/2014 Echo: EF 60-65%, mild LVH, grade 1 DD, dynamic obstruction @ rest - peak velocity of 271 cm/sec, peak grad of 86mmHg, PASP 27mmHg.   Past Surgical History  Procedure Laterality Date  . Nstemi  06/2010  . Spinal fusion surgery    . Knee surgery    . Cardiac catheterization  SHOWED RUPTURE PLAQUE IN THE LAD. THE LAD IS NONOBSTRUCTIVE WITH ONLY 30-40% STENOSIS  . Back surgery    . Breast surgery  1964    x3  . Ankle surgery    . Colonoscopy     Social History:   reports that she has never smoked. She has never used smokeless tobacco. She reports that she does not drink alcohol or use illicit drugs.  Family History  Problem Relation Age of Onset  . Kidney disease Mother   . Kidney disease Brother   . Lung cancer Father     Medications: Patient's Medications  New Prescriptions   No medications on file  Previous Medications   ALBUTEROL (PROVENTIL) (2.5  MG/3ML) 0.083% NEBULIZER SOLUTION    Take 2.5 mg by nebulization every 6 (six) hours as needed for wheezing or shortness of breath (wheezing).   ASPIRIN 81 MG CHEWABLE TABLET    Chew 81 mg by mouth daily.   CALCIUM-VITAMIN D (OSCAL WITH D) 500-200 MG-UNIT PER TABLET    Take 1 tablet by mouth 3 (three) times daily.   CETIRIZINE (ZYRTEC) 10 MG TABLET    Take 10 mg by mouth daily.   CHOLECALCIFEROL (VITAMIN D) 2000 UNITS CAPS    Take 1 capsule (2,000 Units total) by mouth daily.   CRANBERRY 200 MG CAPS    Take 2 capsules by mouth 2 (two) times daily.   ESOMEPRAZOLE (NEXIUM) 40 MG CAPSULE    Take 1 capsule (40 mg total) by mouth 2 (two) times daily before a meal.   FERROUS SULFATE 325 (65 FE) MG TABLET    Take 325 mg by mouth daily with breakfast.   HYDROCODONE-ACETAMINOPHEN (NORCO/VICODIN) 5-325 MG TABLET    Take one tablet by mouth twice daily for pain. Take one tablet by mouth every 8 hours as needed for pain. Hold for sedation. Do not exceed 4gm of Tylenol in 24 hours   IPRATROPIUM (ATROVENT) 0.03 % NASAL SPRAY    Place 2 sprays into both nostrils every 12 (twelve) hours.   LEVOTHYROXINE (SYNTHROID, LEVOTHROID) 50 MCG TABLET    Take 50 mcg by mouth daily.    LIDOCAINE (LIDODERM) 5 %    Place 1 patch onto the skin daily. Remove & Discard patch within 12 hours or as directed by MD   LORAZEPAM (ATIVAN) 0.5 MG TABLET    Take one tablet by mouth every night at bedtime for anxiety   METHOCARBAMOL (ROBAXIN) 500 MG TABLET    Take 500 mg by mouth at bedtime.    METOPROLOL TARTRATE (LOPRESSOR) 25 MG TABLET    Take 25 mg by mouth 2 (two) times daily.   MONTELUKAST (SINGULAIR) 10 MG TABLET    Take 10 mg by mouth at bedtime.   MULTIPLE VITAMIN (MULTIVITAMIN) TABLET    Take 1 tablet by mouth daily.     OLOPATADINE (PATANOL) 0.1 % OPHTHALMIC SOLUTION    Place 1 drop into both eyes daily.   OMEGA-3 FATTY ACIDS (FISH OIL) 1200 MG CAPS    Take 1,200 mg by mouth daily.   PATADAY 0.2 % SOLN    1 drop daily.    PREDNISONE (DELTASONE) 10 MG TABLET    Take 1 tablet by mouth daily.   PROLIA 60 MG/ML SOLN INJECTION    Inject 60 mLs into the skin.   PROMETHAZINE (PHENERGAN) 25 MG TABLET    Take 25 mg by mouth 2 (two) times daily as needed.   ROSUVASTATIN (CRESTOR) 20 MG TABLET  Take 20 mg by mouth every evening.    SACCHAROMYCES BOULARDII (FLORASTOR) 250 MG CAPSULE    Take 250 mg by mouth 2 (two) times daily.    SERTRALINE (ZOLOFT) 50 MG TABLET    Take 50 mg by mouth every morning.  Modified Medications   No medications on file  Discontinued Medications   No medications on file     Physical Exam: Filed Vitals:   06/20/15 0936  BP: 106/60  Pulse: 84  Temp: 98.2 F (36.8 C)  Resp: 18  Weight: 148 lb 3.2 oz (67.223 kg)    Physical Exam  Constitutional: She is oriented to person, place, and time. No distress.  Elderly female   HENT:  Head: Normocephalic and atraumatic.  Mouth/Throat: Oropharynx is clear and moist. No oropharyngeal exudate.  Eyes: Pupils are equal, round, and reactive to light.  Neck: Normal range of motion. Neck supple.  Cardiovascular: Normal rate, regular rhythm, normal heart sounds and intact distal pulses.   No murmur heard. Pulmonary/Chest: Effort normal and breath sounds normal.  Abdominal: Soft. Bowel sounds are normal. There is no tenderness.  Musculoskeletal: She exhibits no edema or tenderness.  Neurological: She is alert and oriented to person, place, and time.  Skin: Skin is warm and dry. She is not diaphoretic.  Psychiatric: She has a normal mood and affect.    Labs reviewed: Basic Metabolic Panel:  Recent Labs  10/29/14 1659  11/02/14 0846 11/03/14 0350 11/04/14 0519 11/05/14 0830 03/12/15 03/20/15  NA 137  < > 140 136 141  --  146 144  K 4.1  < > 3.4* 3.5 3.4* 4.5 4.5 4.2  CL 104  < > 109 106 107  --   --   --   CO2 13*  < > 24 23 25   --   --   --   GLUCOSE 352*  < > 89 100* 123*  --   --   --   BUN 21*  < > 32* 28* 27*  --  55* 34*    CREATININE 1.80*  < > 1.54* 1.70* 1.72*  --  2.4* 1.6*  CALCIUM 9.1  < > 8.0* 7.9* 8.4*  --   --   --   MG 1.9  --  1.7 2.0  --   --   --   --   PHOS 4.8*  --   --   --   --   --   --   --   < > = values in this interval not displayed. Liver Function Tests:  Recent Labs  10/29/14 1029 10/29/14 1659 10/30/14 0414  AST 39 44* 41  ALT 19 23 25   ALKPHOS 47 47 48  BILITOT 1.5* 0.8 0.9  PROT 6.3* 6.3* 6.7  ALBUMIN 3.3* 3.4* 3.3*   No results for input(s): LIPASE, AMYLASE in the last 8760 hours. No results for input(s): AMMONIA in the last 8760 hours. CBC:  Recent Labs  10/29/14 1029  11/01/14 0356  11/02/14 0846 11/03/14 0350 03/12/15 03/20/15 03/28/15  WBC 14.1*  < > 9.7  --  12.0* 11.7* 6.5 10.3 11.6  NEUTROABS 11.4*  --   --   --   --   --   --   --   --   HGB 11.8*  < > 9.8*  < > 10.1* 9.5* 8.7* 10.6* 10.8*  HCT 38.2  < > 31.3*  < > 31.5* 29.6* 29* 35* 34*  MCV 90.5  < >  91.3  --  89.2 87.8  --   --   --   PLT 110*  < > 106*  --  107* 109* 245 157 174  < > = values in this interval not displayed. TSH:  Recent Labs  07/06/14 03/28/15   TSH 1.03 0.67    A1C: No results found for: HGBA1C Lipid Panel:  Recent Labs  07/13/14 03/28/15  CHOL 161 115  HDL 52 47  LDLCALC 59 41  TRIG 252* 133     Assessment/Plan 1. Hemorrhoids, unspecified hemorrhoid type Denies constipation, may use Prep H per rectum QID PRN  2. Essential hypertension Blood pressure remains stable, conts on lopressor BID  3. Other specified hypothyroidism Stable, TSH of 0.67 in September, conts on synthroid 50 mcg  4. Osteoporosis Remains on monthly prolia injection with calcium and vit D  5. Osteoarthritis, unspecified osteoarthritis type, unspecified site Stable, reports occasional worsening of left shoulder pain however controlled with current medication. conts on Lidoderm patch, norco,   6. Anxiety Mood has been stable, cont on zoloft 50 mg daily      [name] K. Harle Battiest  [facility] [phone] 8 am - 5 pm) [phone] (after hours)

## 2015-06-23 LAB — BASIC METABOLIC PANEL
BUN: 26 mg/dL — AB (ref 4–21)
CREATININE: 1.4 mg/dL — AB (ref 0.5–1.1)
Glucose: 116 mg/dL
Potassium: 4.1 mmol/L (ref 3.4–5.3)
Sodium: 140 mmol/L (ref 137–147)

## 2015-06-26 DIAGNOSIS — D509 Iron deficiency anemia, unspecified: Secondary | ICD-10-CM | POA: Diagnosis not present

## 2015-06-26 LAB — BASIC METABOLIC PANEL
BUN: 28 mg/dL — AB (ref 4–21)
Creatinine: 1.4 mg/dL — AB (ref 0.5–1.1)
GLUCOSE: 84 mg/dL
Potassium: 4 mmol/L (ref 3.4–5.3)
SODIUM: 146 mmol/L (ref 137–147)

## 2015-06-26 LAB — CBC AND DIFFERENTIAL
HCT: 35 % — AB (ref 36–46)
Hemoglobin: 11.1 g/dL — AB (ref 12.0–16.0)
Platelets: 130 10*3/uL — AB (ref 150–399)
WBC: 9.7 10*3/mL

## 2015-06-27 ENCOUNTER — Non-Acute Institutional Stay (SKILLED_NURSING_FACILITY): Payer: Medicare Other | Admitting: Nurse Practitioner

## 2015-06-27 ENCOUNTER — Encounter: Payer: Self-pay | Admitting: Nurse Practitioner

## 2015-06-27 DIAGNOSIS — K219 Gastro-esophageal reflux disease without esophagitis: Secondary | ICD-10-CM

## 2015-06-27 DIAGNOSIS — K649 Unspecified hemorrhoids: Secondary | ICD-10-CM

## 2015-06-27 DIAGNOSIS — R413 Other amnesia: Secondary | ICD-10-CM | POA: Diagnosis not present

## 2015-06-27 DIAGNOSIS — E038 Other specified hypothyroidism: Secondary | ICD-10-CM

## 2015-06-27 NOTE — Progress Notes (Signed)
Patient ID: [name], female   DOB: 05/10/1930, 81 y.o.   MRN: [id number]    Nursing Home Location:  [place] of Service: SNF (31)  PCP: [name], MD  Allergies  Allergen Reactions  . Biphosphate   . Morphine And Related Other (See Comments)    unknown  . Percocet [Oxycodone-Acetaminophen]     unknown  . Plavix [Clopidogrel Bisulfate] Other (See Comments)    RASH  . Sulfur Other (See Comments)    GI upset    Chief Complaint  Patient presents with  . Acute Visit    Patient requested visit     HPI:  Patient is a 81 y.o. female seen today at [facility] and [place] pt request. Ms. Emick has a past medical history of RA, depression, HTN, hypothyroidism, CVA, CHF, and CAD. She is feeling well today. Pt was seen last week but does not remember what was said. Questions if her thyroid needs to be checked, her hair is thinning. Reports she has stomach upset last week but better now. Has been eating lunch. Pt reports she does like it at [facility] and the food is not good. Reports she has issues with hemorrhoids, has not used cream.  Does not seem to have any other specific complaints.   Review of Systems:  Review of Systems  Constitutional: Negative for fever, chills and fatigue.  HENT: Negative for congestion, postnasal drip and sinus pressure.   Respiratory: Negative for cough, shortness of breath and wheezing.   Cardiovascular: Negative for chest pain, palpitations and leg swelling.  Gastrointestinal: Negative for nausea, vomiting, abdominal pain, diarrhea, constipation, blood in stool, abdominal distention and anal bleeding.       Denies GERD  Genitourinary: Negative for urgency, frequency and hematuria.  Musculoskeletal: Negative for myalgias, back pain and arthralgias.  Skin: Negative.   Psychiatric/Behavioral: Positive for confusion. The patient is not nervous/anxious.     Past Medical History  Diagnosis Date  .  Arthritis   . Right ear pain   . Renal insufficiency   . RA (rheumatoid arthritis) ([place])   . PUD (peptic ulcer disease)   . GI bleed 2005  . GERD (gastroesophageal reflux disease)   . Hiatal hernia   . Hypothyroidism   . Osteoporosis   . Depression   . Anxiety   . Coronary artery disease     a. 2012 s/p NSTEMI/Cath: 95% apical LAD dzs, otw nonobs dzs-->Med Rx;  b. 05/2011 MV: no ischemia, EF 79%, inf infarct- attenuation.  . CVA (cerebral vascular accident) ([place])   . TIA (transient ischemic attack)   . Fall   . DVT of lower extremity (deep venous thrombosis) ([place])   . HCAP (healthcare-associated pneumonia) 01/22/2013  . Enteritis due to Clostridium difficile 01/04/2013  . Sepsis ([place]) 12/19/2012  . Recurrent colitis due to Clostridium difficile 06/02/2012  . Essential hypertension   . Hyperlipidemia   . Fatigue   . Weight loss   . Hemorrhoids   . Malnutrition ([place])   . Dyspnea   . Elevated troponin     a. 10/2014 in setting of sepsis/?HCAP.  Marland Kitchen Chronic diastolic CHF (congestive heart failure) ([place])     a. 10/2014 Echo: EF 60-65%, mild LVH, grade 1 DD, dynamic obstruction @ rest - peak velocity of 271 cm/sec, peak grad of 58mmHg, PASP 50mmHg.  Marland Kitchen Obstructive hypertrophic cardiomyopathy ([place])     a. 10/2014 Echo: EF 60-65%, mild LVH, grade 1 DD,  dynamic obstruction @ rest - peak velocity of 271 cm/sec, peak grad of 33mmHg, PASP 84mmHg.   Past Surgical History  Procedure Laterality Date  . Nstemi  06/2010  . Spinal fusion surgery    . Knee surgery    . Cardiac catheterization      SHOWED RUPTURE PLAQUE IN THE LAD. THE LAD IS NONOBSTRUCTIVE WITH ONLY 30-40% STENOSIS  . Back surgery    . Breast surgery  1964    x3  . Ankle surgery    . Colonoscopy     Social History:   reports that she has never smoked. She has never used smokeless tobacco. She reports that she does not drink alcohol or use illicit drugs.  Family History  Problem Relation Age of Onset  . Kidney disease Mother     . Kidney disease Brother   . Lung cancer Father     Medications: Patient's Medications  New Prescriptions   No medications on file  Previous Medications   ARTIFICIAL SALIVA (ACT DRY MOUTH) LOZG    Use as directed in the mouth or throat as needed (for dry mouth).   ASPIRIN 81 MG CHEWABLE TABLET    Chew 81 mg by mouth daily.   CALCIUM-VITAMIN D (OSCAL WITH D) 500-200 MG-UNIT PER TABLET    Take 1 tablet by mouth 3 (three) times daily.   CETIRIZINE (ZYRTEC) 10 MG TABLET    Take 10 mg by mouth daily.   CHOLECALCIFEROL (VITAMIN D) 2000 UNITS CAPS    Take 1 capsule (2,000 Units total) by mouth daily.   CRANBERRY 200 MG CAPS    Take 2 capsules by mouth 2 (two) times daily.   ESOMEPRAZOLE (NEXIUM) 40 MG CAPSULE    Take 1 capsule (40 mg total) by mouth 2 (two) times daily before a meal.   FERROUS SULFATE 325 (65 FE) MG TABLET    Take 325 mg by mouth 3 (three) times daily with meals.   HYDROCODONE-ACETAMINOPHEN (NORCO/VICODIN) 5-325 MG TABLET    Take one tablet by mouth twice daily for pain. Take one tablet by mouth every 8 hours as needed for pain. Hold for sedation. Do not exceed 4gm of Tylenol in 24 hours   IPRATROPIUM (ATROVENT) 0.03 % NASAL SPRAY    Place 2 sprays into both nostrils every 12 (twelve) hours.   LEVOTHYROXINE (SYNTHROID, LEVOTHROID) 50 MCG TABLET    Take 50 mcg by mouth daily.    LIDOCAINE (LIDODERM) 5 %    Place 1 patch onto the skin daily. Remove & Discard patch within 12 hours or as directed by MD   LORAZEPAM (ATIVAN) 0.5 MG TABLET    Take one tablet by mouth every night at bedtime for anxiety   METHOCARBAMOL (ROBAXIN) 500 MG TABLET    Take 500 mg by mouth at bedtime.    METOPROLOL TARTRATE (LOPRESSOR) 25 MG TABLET    Take 25 mg by mouth 2 (two) times daily.   MONTELUKAST (SINGULAIR) 10 MG TABLET    Take 10 mg by mouth at bedtime.   MULTIPLE VITAMIN (MULTIVITAMIN) TABLET    Take 1 tablet by mouth daily.     OMEGA-3 FATTY ACIDS (FISH OIL) 1200 MG CAPS    Take 1,200 mg by mouth  daily.   PATADAY 0.2 % SOLN    1 drop daily.   PREDNISONE (DELTASONE) 10 MG TABLET    Take 1 tablet by mouth daily.   PROLIA 60 MG/ML SOLN INJECTION    Inject 60 mLs into  the skin.   PROMETHAZINE (PHENERGAN) 25 MG TABLET    Take 25 mg by mouth 2 (two) times daily as needed.   ROSUVASTATIN (CRESTOR) 20 MG TABLET    Take 20 mg by mouth every evening.    SACCHAROMYCES BOULARDII (FLORASTOR) 250 MG CAPSULE    Take 250 mg by mouth 2 (two) times daily.    SERTRALINE (ZOLOFT) 50 MG TABLET    Take 50 mg by mouth every morning.  Modified Medications   No medications on file  Discontinued Medications   ALBUTEROL (PROVENTIL) (2.5 MG/3ML) 0.083% NEBULIZER SOLUTION    Take 2.5 mg by nebulization every 6 (six) hours as needed for wheezing or shortness of breath (wheezing).   FERROUS SULFATE 325 (65 FE) MG TABLET    Take 325 mg by mouth daily with breakfast.   OLOPATADINE (PATANOL) 0.1 % OPHTHALMIC SOLUTION    Place 1 drop into both eyes daily.     Physical Exam: Filed Vitals:   06/27/15 1320  BP: 118/66  Pulse: 78  Temp: 97.9 F (36.6 C)  TempSrc: Oral  Resp: 20  SpO2: 97%    Physical Exam  Constitutional: She is oriented to person, place, and time. No distress.  Elderly female   HENT:  Head: Normocephalic and atraumatic.  Eyes: Pupils are equal, round, and reactive to light.  Neck: Normal range of motion. Neck supple. No JVD present. No thyromegaly present.  Cardiovascular: Normal rate, regular rhythm and normal heart sounds.   No murmur heard. Pulmonary/Chest: Effort normal and breath sounds normal.  Abdominal: Soft. Bowel sounds are normal. She exhibits no distension. There is no tenderness.  Musculoskeletal: She exhibits no edema.  Lymphadenopathy:    She has no cervical adenopathy.  Neurological: She is alert and oriented to person, place, and time.  Skin: Skin is warm and dry. She is not diaphoretic.  Psychiatric: Her affect is blunt. She exhibits abnormal recent memory.     Labs reviewed: Basic Metabolic Panel:  Recent Labs  10/29/14 1659  11/02/14 0846 11/03/14 0350 11/04/14 0519  03/20/15 06/23/15 06/26/15  NA 137  < > 140 136 141  < > 144 140 146  K 4.1  < > 3.4* 3.5 3.4*  < > 4.2 4.1 4.0  CL 104  < > 109 106 107  --   --   --   --   CO2 13*  < > 24 23 25   --   --   --   --   GLUCOSE 352*  < > 89 100* 123*  --   --   --   --   BUN 21*  < > 32* 28* 27*  < > 34* 26* 28*  CREATININE 1.80*  < > 1.54* 1.70* 1.72*  < > 1.6* 1.4* 1.4*  CALCIUM 9.1  < > 8.0* 7.9* 8.4*  --   --   --   --   MG 1.9  --  1.7 2.0  --   --   --   --   --   PHOS 4.8*  --   --   --   --   --   --   --   --   < > = values in this interval not displayed. Liver Function Tests:  Recent Labs  10/29/14 1029 10/29/14 1659 10/30/14 0414  AST 39 44* 41  ALT 19 23 25   ALKPHOS 47 47 48  BILITOT 1.5* 0.8 0.9  PROT 6.3* 6.3* 6.7  ALBUMIN 3.3* 3.4* 3.3*   No results for input(s): LIPASE, AMYLASE in the last 8760 hours. No results for input(s): AMMONIA in the last 8760 hours. CBC:  Recent Labs  10/29/14 1029  11/01/14 0356  11/02/14 0846 11/03/14 0350  03/20/15 03/28/15 06/26/15  WBC 14.1*  < > 9.7  --  12.0* 11.7*  < > 10.3 11.6 9.7  NEUTROABS 11.4*  --   --   --   --   --   --   --   --   --   HGB 11.8*  < > 9.8*  < > 10.1* 9.5*  < > 10.6* 10.8* 11.1*  HCT 38.2  < > 31.3*  < > 31.5* 29.6*  < > 35* 34* 35*  MCV 90.5  < > 91.3  --  89.2 87.8  --   --   --   --   PLT 110*  < > 106*  --  107* 109*  < > 157 174 130*  < > = values in this interval not displayed. TSH:  Recent Labs  07/06/14 03/28/15 06/28/15 1555  TSH 1.03 0.67 0.81   A1C: No results found for: HGBA1C Lipid Panel:  Recent Labs  07/13/14 03/28/15  CHOL 161 115  HDL 52 47  LDLCALC 59 41  TRIG 252* 133    Assessment/Plan 1. Other specified hypothyroidism Went over lab work with pt, reviewed TSH (which was normal in September) with patient and provided reassurance pt that she was taking  synthroid.  2. Memory loss -noted memory loss by staff and has a hard time will recall, will have SW get BIMS for evaluation of memory.   3. Hemorrhoids, unspecified hemorrhoid type Stable, encouraged to use preparation H as needed, may need to be scheduled for a short course if pt can not remember to ask for this.   4. Gastroesophageal reflux disease without esophagitis Reports upset stomach last week but no current symptoms, denies worsening acid reflux, will monitor  conts on nexium daily    Blanche Scovell K. Harle Battiest  [facility] [phone] 8 am - 5 pm) [phone] (after hours)

## 2015-06-28 ENCOUNTER — Ambulatory Visit (INDEPENDENT_AMBULATORY_CARE_PROVIDER_SITE_OTHER)
Admission: RE | Admit: 2015-06-28 | Discharge: 2015-06-28 | Disposition: A | Discharge status: Home / Self Care | Payer: Medicare Other | Source: Ambulatory Visit | Attending: Internal Medicine | Admitting: Internal Medicine

## 2015-06-28 ENCOUNTER — Ambulatory Visit (INDEPENDENT_AMBULATORY_CARE_PROVIDER_SITE_OTHER): Payer: Medicare Other | Admitting: Internal Medicine

## 2015-06-28 ENCOUNTER — Other Ambulatory Visit (INDEPENDENT_AMBULATORY_CARE_PROVIDER_SITE_OTHER): Payer: Medicare Other

## 2015-06-28 ENCOUNTER — Encounter: Payer: Self-pay | Admitting: Internal Medicine

## 2015-06-28 ENCOUNTER — Other Ambulatory Visit: Payer: Self-pay | Admitting: Internal Medicine

## 2015-06-28 VITALS — BP 120/64 | HR 77 | Ht 65.0 in | Wt 145.0 lb

## 2015-06-28 DIAGNOSIS — I2510 Atherosclerotic heart disease of native coronary artery without angina pectoris: Secondary | ICD-10-CM

## 2015-06-28 DIAGNOSIS — D649 Anemia, unspecified: Secondary | ICD-10-CM | POA: Diagnosis not present

## 2015-06-28 DIAGNOSIS — I5032 Chronic diastolic (congestive) heart failure: Secondary | ICD-10-CM

## 2015-06-28 DIAGNOSIS — M069 Rheumatoid arthritis, unspecified: Secondary | ICD-10-CM

## 2015-06-28 DIAGNOSIS — J189 Pneumonia, unspecified organism: Secondary | ICD-10-CM

## 2015-06-28 DIAGNOSIS — E039 Hypothyroidism, unspecified: Secondary | ICD-10-CM

## 2015-06-28 LAB — BASIC METABOLIC PANEL
BUN: 29 mg/dL — AB (ref 6–23)
CHLORIDE: 103 meq/L (ref 96–112)
CO2: 29 mEq/L (ref 19–32)
CREATININE: 1.47 mg/dL — AB (ref 0.40–1.20)
Calcium: 10.2 mg/dL (ref 8.4–10.5)
GFR: 36.05 mL/min — AB (ref >60.00)
GLUCOSE: 158 mg/dL — AB (ref 70–99)
Potassium: 3.9 mEq/L (ref 3.5–5.1)
Sodium: 142 mEq/L (ref 135–145)

## 2015-06-28 LAB — CBC WITH DIFFERENTIAL/PLATELET
BASOS PCT: 0.3 % (ref 0.0–3.0)
Basophils Absolute: 0 10*3/uL (ref 0.0–0.1)
EOS ABS: 0.1 10*3/uL (ref 0.0–0.7)
Eosinophils Relative: 0.5 % (ref 0.0–5.0)
HCT: 38.2 % (ref 36.0–46.0)
Hemoglobin: 12.4 g/dL (ref 12.0–15.0)
LYMPHS ABS: 0.9 10*3/uL (ref 0.7–4.0)
Lymphocytes Relative: 6.3 % — ABNORMAL LOW (ref 12.0–46.0)
MCHC: 32.6 g/dL (ref 30.0–36.0)
MCV: 87.5 fl (ref 78.0–100.0)
MONO ABS: 0.4 10*3/uL (ref 0.1–1.0)
Monocytes Relative: 3.2 % (ref 3.0–12.0)
NEUTROS ABS: 12.5 10*3/uL — AB (ref 1.4–7.7)
Neutrophils Relative %: 89.7 % — ABNORMAL HIGH (ref 43.0–77.0)
PLATELETS: 173 10*3/uL (ref 150.0–400.0)
RBC: 4.36 Mil/uL (ref 3.87–5.11)
RDW: 15.4 % (ref 11.5–15.5)
WBC: 13.9 10*3/uL — AB (ref 4.0–10.5)

## 2015-06-28 LAB — TSH: TSH: 0.81 u[IU]/mL (ref 0.35–4.50)

## 2015-06-28 NOTE — Progress Notes (Signed)
   Subjective:    Patient ID: [name], female    DOB: 1930/03/10, 81 y.o.   MRN: [id number]  HPI 81 yo female SNF resident seen for pulmonary consult for ? HCAP vs Bronchitiis with acute on chronic diastolic CHF   [id number] Post hospital follow up  Pt was recently admitted for acute resp failure with ? HCAP vs Bronchitis with acute on chronic Diastolic CHF.  Tx/ w initial BIPAP , IV abx, O2  And steroids and nebs.  Required diuresis . Did have troponin bump, seen by cardiology  Felt probable secondary to demand ischemia .  Since discharge she is feeling better.  Cough and congestion are improved.  Still weak and gets winded easily .  No chest pain,orthopnea, edema or fever.  Previous patient of Dr. Annamaria Boots  06/28/2015-81 year old female never smoker with recent history HCAP vs bronchitis, complicated by history CVA, adrenal insufficiency, MI/CAD, hypothyroid, rheumatoid arthritis, GERD, allergic rhinitis, HBP, C. difficile colitis Saw [facility] 10-2014 for HFU; former pt in 2007 for allergy- records not available. Resides at [facility]  Documented flu vax at [facility] Now at baseline-occasional wheeze. She has a rescue inhaler she never uses it doesn't feel she needs. Little routine cough. No prior pneumonia. She requests lab work while here because she has yet to meet the physician at her retirement facility. Cold weather affects her rheumatoid arthritis but not her breathing. She is not sure about her vaccination status but we agreed that would be managed at [facility].  Review of Systems Constitutional:   No  weight loss, night sweats,  Fevers, chills, + fatigue, or  lassitude.  HEENT:   No headaches,  Difficulty swallowing,  Tooth/dental problems, or  Sore throat,                No sneezing, itching, ear ache, nasal congestion, post nasal drip,   CV:  No chest pain,  Orthopnea, PND, swelling in lower extremities, anasarca, dizziness, palpitations, syncope.   GI  No heartburn,  indigestion, abdominal pain, nausea, vomiting, diarrhea, change in bowel habits, loss of appetite, bloody stools.   Resp:   + Rare wheezing.  No chest wall deformity  Skin: no rash or lesions.  GU: no dysuria, change in color of urine, no urgency or frequency.  No flank pain, no hematuria   MS:  No joint pain or swelling.  No decreased range of motion.  No back pain.  Psych:  No change in mood or affect. No depression or anxiety.  No memory loss      Objective:   Physical Exam GEN: A/Ox3; pleasant , NAD, frail and elderly in wheelchair   HEENT:  [place]/AT,  EACs-clear, TMs-wnl, NOSE-clear, THROAT-clear, no lesions, no postnasal drip or exudate noted.   NECK:  Supple w/ fair ROM; no JVD; normal carotid impulses w/o bruits; no thyromegaly or nodules palpated; no lymphadenopathy.  RESP Decreased BS in bases w/o, wheezes/ rales/ or rhonchi.no accessory muscle use, no dullness to percussion  CARD:  RRR, no m/r/g  , + trace peripheral edema, pulses intact, no cyanosis or clubbing.  GI:   Soft & nt; nml bowel sounds; no organomegaly or masses detected.  Musco: Warm bil, no deformities or joint swelling noted.   Neuro: alert, no focal deficits noted.  Well oriented  Skin: Warm, no lesions or rashes    Assessment & Plan:

## 2015-06-28 NOTE — Patient Instructions (Signed)
Order- CXR  Dx pneumonia May, 2016  Order- labs- CBC w diff, BMET, TSH      Dx anemia, hypothyroid

## 2015-06-29 NOTE — Addendum Note (Signed)
Addended by: Baird Lyons D on: 06/29/2015 09:06 PM   Modules accepted: Level of Service

## 2015-06-29 NOTE — Assessment & Plan Note (Signed)
Apparently well compensated at this visit without right sided heart failure signs except trace peripheral edema

## 2015-06-29 NOTE — Assessment & Plan Note (Signed)
This is a significant factor limiting her activity at her ability to exercise to the limits of her pulmonary and cardiac capability

## 2015-06-29 NOTE — Assessment & Plan Note (Signed)
Managed elsewhere Plan-TSH while blood is being drawn

## 2015-06-29 NOTE — Assessment & Plan Note (Signed)
Clinically resolved. At her request we are rechecking some basic labs which can be shared with her retirement facility Plan-chest x-ray, CBC with differential, bmet, TSH

## 2015-07-03 ENCOUNTER — Telehealth: Payer: Self-pay | Admitting: Internal Medicine

## 2015-07-03 NOTE — Telephone Encounter (Signed)
Notes Recorded by Deneise Lever, MD on 06/29/2015 at 2:50 PM Laboratory results-blood sugar and white blood count are both elevated, which is expected taking prednisone. Thyroid function is within normal range. Kidney function is still a little abnormal but better than 3 months ago. ------------------------------- Pt's daughter, Edwin Cap is aware of results. Nothing further was needed.

## 2015-07-03 NOTE — Telephone Encounter (Signed)
Per email  I am Raeah Stouder daughter. She is in [employer] home and she had blood work taken with Dr. Annamaria Boots last week. Someone from your office has tried to call her but it is hard for mom to get to her phone so she missed the call. I should be on her chart as someone to talk with and would like to speak with someone regarding her labs. I tried calling your office today but the girl who answered the phone was rude!! so if someone could call me at [phone] I would greatly appreciate it.

## 2015-07-04 DIAGNOSIS — M79672 Pain in left foot: Secondary | ICD-10-CM | POA: Diagnosis not present

## 2015-07-04 DIAGNOSIS — B351 Tinea unguium: Secondary | ICD-10-CM | POA: Diagnosis not present

## 2015-07-04 DIAGNOSIS — I2510 Atherosclerotic heart disease of native coronary artery without angina pectoris: Secondary | ICD-10-CM | POA: Diagnosis not present

## 2015-07-04 DIAGNOSIS — M79671 Pain in right foot: Secondary | ICD-10-CM | POA: Diagnosis not present

## 2015-07-10 ENCOUNTER — Encounter: Payer: Self-pay | Admitting: Internal Medicine

## 2015-07-10 ENCOUNTER — Non-Acute Institutional Stay (SKILLED_NURSING_FACILITY): Payer: Medicare Other | Admitting: Internal Medicine

## 2015-07-10 DIAGNOSIS — D72829 Elevated white blood cell count, unspecified: Secondary | ICD-10-CM | POA: Diagnosis not present

## 2015-07-10 DIAGNOSIS — K279 Peptic ulcer, site unspecified, unspecified as acute or chronic, without hemorrhage or perforation: Secondary | ICD-10-CM

## 2015-07-10 DIAGNOSIS — R1114 Bilious vomiting: Secondary | ICD-10-CM

## 2015-07-10 DIAGNOSIS — D508 Other iron deficiency anemias: Secondary | ICD-10-CM | POA: Diagnosis not present

## 2015-07-10 NOTE — Progress Notes (Signed)
Patient ID: [name], female   DOB: 11-Jul-1929, 82 y.o.   MRN: [id number]     Facility: [facility]    PCP: [name], MD  Code Status: Full Code  Allergies  Allergen Reactions  . Biphosphate   . Morphine And Related Other (See Comments)    unknown  . Percocet [Oxycodone-Acetaminophen]     unknown  . Plavix [Clopidogrel Bisulfate] Other (See Comments)    RASH  . Sulfur Other (See Comments)    GI upset    Chief Complaint  Patient presents with  . Acute Visit    Nauseated and weak      HPI:  82 year old patient is seen for acute visit. She has been nauseated and started throwing up since last evening. She has vomited x 5 today and mostly the food content. She complaints of nausea at present. She denies any abdominal pain or diarrhea. Denies any chest pain or dyspnea. No food from outside of the facility eaten. No new medication changes. Had temperature of 100. 2 this am which subsided after taking tylenol.  Review of Systems:  Constitutional: Negative forchills, diaphoresis.  HENT: Negative for headache, congestion, nasal discharge Eyes: Negative for blurred vision, double vision and discharge.  Respiratory: Negative for shortness of breath and wheezing.  on o2 Cardiovascular: Negative for chest pain, palpitations, leg swelling.  Gastrointestinal: Negative for heartburn, abdominal pain Genitourinary: Negative for dysuria  Musculoskeletal: positive for back pain Skin: Negative for rash.  Neurological: Negative for dizziness Psychiatric/Behavioral: Negative for depression   Past Medical History  Diagnosis Date  . Arthritis   . Right ear pain   . Renal insufficiency   . RA (rheumatoid arthritis) ([place])   . PUD (peptic ulcer disease)   . GI bleed 2005  . GERD (gastroesophageal reflux disease)   . Hiatal hernia   . Hypothyroidism   . Osteoporosis   . Depression   . Anxiety   . Coronary artery disease     a. 2012 s/p  NSTEMI/Cath: 95% apical LAD dzs, otw nonobs dzs-->Med Rx;  b. 05/2011 MV: no ischemia, EF 79%, inf infarct- attenuation.  . CVA (cerebral vascular accident) ([place])   . TIA (transient ischemic attack)   . Fall   . DVT of lower extremity (deep venous thrombosis) ([place])   . HCAP (healthcare-associated pneumonia) 01/22/2013  . Enteritis due to Clostridium difficile 01/04/2013  . Sepsis ([place]) 12/19/2012  . Recurrent colitis due to Clostridium difficile 06/02/2012  . Essential hypertension   . Hyperlipidemia   . Fatigue   . Weight loss   . Hemorrhoids   . Malnutrition ([place])   . Dyspnea   . Elevated troponin     a. 10/2014 in setting of sepsis/?HCAP.  Marland Kitchen Chronic diastolic CHF (congestive heart failure) ([place])     a. 10/2014 Echo: EF 60-65%, mild LVH, grade 1 DD, dynamic obstruction @ rest - peak velocity of 271 cm/sec, peak grad of 25mmHg, PASP 68mmHg.  Marland Kitchen Obstructive hypertrophic cardiomyopathy ([place])     a. 10/2014 Echo: EF 60-65%, mild LVH, grade 1 DD, dynamic obstruction @ rest - peak velocity of 271 cm/sec, peak grad of 61mmHg, PASP 71mmHg.    Medications: Patient's Medications  New Prescriptions   No medications on file  Previous Medications   ARTIFICIAL SALIVA (ACT DRY MOUTH) LOZG    Use as directed in the mouth or throat as needed (for dry mouth).   ASPIRIN 81 MG CHEWABLE TABLET    Chew  81 mg by mouth daily.   CALCIUM-VITAMIN D (OSCAL WITH D) 500-200 MG-UNIT PER TABLET    Take 1 tablet by mouth 3 (three) times daily.   CETIRIZINE (ZYRTEC) 10 MG TABLET    Take 10 mg by mouth daily.   CHOLECALCIFEROL (VITAMIN D) 2000 UNITS CAPS    Take 1 capsule (2,000 Units total) by mouth daily.   CRANBERRY 200 MG CAPS    Take 2 capsules by mouth 2 (two) times daily.   ESOMEPRAZOLE (NEXIUM) 20 MG CAPSULE    Take 20 mg by mouth 2 (two) times daily before a meal.   FERROUS SULFATE 325 (65 FE) MG TABLET    Take 325 mg by mouth 3 (three) times daily with meals.   HYDROCODONE-ACETAMINOPHEN (NORCO/VICODIN) 5-325 MG  TABLET    Take one tablet by mouth twice daily for pain. Take one tablet by mouth every 8 hours as needed for pain. Hold for sedation. Do not exceed 4gm of Tylenol in 24 hours   IPRATROPIUM (ATROVENT) 0.03 % NASAL SPRAY    Place 2 sprays into both nostrils daily as needed (For asthma).    LEVOTHYROXINE (SYNTHROID, LEVOTHROID) 50 MCG TABLET    Take 50 mcg by mouth daily.    LIDOCAINE (LIDODERM) 5 %    Place 1 patch onto the skin daily. Remove & Discard patch within 12 hours or as directed by MD   LORAZEPAM (ATIVAN) 0.5 MG TABLET    Take one tablet by mouth every night at bedtime for anxiety   METHOCARBAMOL (ROBAXIN) 500 MG TABLET    Take 500 mg by mouth at bedtime.    METOPROLOL TARTRATE (LOPRESSOR) 25 MG TABLET    Take 25 mg by mouth 2 (two) times daily.   MONTELUKAST (SINGULAIR) 10 MG TABLET    Take 10 mg by mouth at bedtime.   MULTIPLE VITAMIN (MULTIVITAMIN) TABLET    Take 1 tablet by mouth daily.     OMEGA-3 FATTY ACIDS (FISH OIL) 1200 MG CAPS    Take 1,200 mg by mouth daily.   PATADAY 0.2 % SOLN    1 drop daily.   PREDNISONE (DELTASONE) 10 MG TABLET    Take 1 tablet by mouth daily.   PROLIA 60 MG/ML SOLN INJECTION    Inject 60 mLs into the skin every 6 (six) months.    PROMETHAZINE (PHENERGAN) 25 MG TABLET    Take 25 mg by mouth 2 (two) times daily as needed.   ROSUVASTATIN (CRESTOR) 20 MG TABLET    Take 20 mg by mouth every evening.    SACCHAROMYCES BOULARDII (FLORASTOR) 250 MG CAPSULE    Take 250 mg by mouth 2 (two) times daily.    SERTRALINE (ZOLOFT) 50 MG TABLET    Take 50 mg by mouth every morning.  Modified Medications   No medications on file  Discontinued Medications   No medications on file     Physical Exam: Filed Vitals:   07/10/15 1447  BP: 149/64  Pulse: 85  Temp: 98.4 F (36.9 C)  TempSrc: Oral  Resp: 18  SpO2: 99%   Constitutional: elderly female with dry heaves HEENT: normocephalic, atraumatic.  Eyes: No icterus or pallor Neck: Neck supple. No JVD present. No  cervical spine tenderness Cardiovascular: Normal rate, regular rhythm. S1, S2, no murmurs, rubs, or gallops. Intact distal pulses.   Respiratory: Effort normal and breath sounds normal. No respiratory distress. She has no wheezes/ rhonchi or crackles  GI: Soft. Bowel sounds are normal. She exhibits  no distension. There is no tenderness.  Musculoskeletal:  She exhibits no edema.  Neurological: She is alert and oriented to person, place, and time.  Skin: Skin is warm and dry.   Psychiatric: She has a normal mood and affect.    Labs reviewed: Basic Metabolic Panel:  Recent Labs  10/29/14 1659  11/02/14 0846 11/03/14 0350 11/04/14 0519  06/23/15 06/26/15 06/28/15 1555  NA 137  < > 140 136 141  < > 140 146 142  K 4.1  < > 3.4* 3.5 3.4*  < > 4.1 4.0 3.9  CL 104  < > 109 106 107  --   --   --  103  CO2 13*  < > 24 23 25   --   --   --  29  GLUCOSE 352*  < > 89 100* 123*  --   --   --  158*  BUN 21*  < > 32* 28* 27*  < > 26* 28* 29*  CREATININE 1.80*  < > 1.54* 1.70* 1.72*  < > 1.4* 1.4* 1.47*  CALCIUM 9.1  < > 8.0* 7.9* 8.4*  --   --   --  10.2  MG 1.9  --  1.7 2.0  --   --   --   --   --   PHOS 4.8*  --   --   --   --   --   --   --   --   < > = values in this interval not displayed. Liver Function Tests:  Recent Labs  10/29/14 1029 10/29/14 1659 10/30/14 0414  AST 39 44* 41  ALT 19 23 25   ALKPHOS 47 47 48  BILITOT 1.5* 0.8 0.9  PROT 6.3* 6.3* 6.7  ALBUMIN 3.3* 3.4* 3.3*   No results for input(s): LIPASE, AMYLASE in the last 8760 hours. No results for input(s): AMMONIA in the last 8760 hours. CBC:  Recent Labs  10/29/14 1029  11/02/14 0846 11/03/14 0350  03/28/15 06/26/15 06/28/15 1555  WBC 14.1*  < > 12.0* 11.7*  < > 11.6 9.7 13.9*  NEUTROABS 11.4*  --   --   --   --   --   --  12.5*  HGB 11.8*  < > 10.1* 9.5*  < > 10.8* 11.1* 12.4  HCT 38.2  < > 31.5* 29.6*  < > 34* 35* 38.2  MCV 90.5  < > 89.2 87.8  --   --   --  87.5  PLT 110*  < > 107* 109*  < > 174 130*  173.0  < > = values in this interval not displayed. Cardiac Enzymes:  Recent Labs  10/31/14 0500 10/31/14 1056 10/31/14 1714  TROPONINI 0.56* 0.47* 0.38*   BNP: Invalid input(s): POCBNP CBG:  Recent Labs  11/04/14 1641 11/05/14 0747 11/06/14 0751  GLUCAP 210* 148* 112*     Assessment/Plan  Nausea and vomiting With temperature this am. Denies abdominal pain, no diarrhea. No chest pain or dyspnea making cardiac etiology less likely. Has hx of gerd and is on PPI. Get stat cbc with diff to rule out infection and cmp with amylase and lipase to rule out acute abdominal etiology and worsening ckd. Had bowel movement yesterday and abdomen is soft making bowel obstruction less likely. Start zofran 4 mg q6h prn nausea and vomiitng, NPO for now and advance to liquid diet only x 24 hrs if tolerated  PUD With ongoing nausea and vomiting, change nexium to 40  mg bid.   Leukocytosis On chronic prednisone which could cause this but given her fever this am get cbc with diff to rule out infectious etiology  Reviewed care plan with patient and nursing supervisor   [name], MD  [facility] [phone] (Monday-Friday 8 am - 5 pm) [phone] (afterhours)

## 2015-07-12 ENCOUNTER — Non-Acute Institutional Stay (SKILLED_NURSING_FACILITY): Payer: Medicare Other | Admitting: Internal Medicine

## 2015-07-12 DIAGNOSIS — R110 Nausea: Secondary | ICD-10-CM | POA: Diagnosis not present

## 2015-07-12 DIAGNOSIS — D508 Other iron deficiency anemias: Secondary | ICD-10-CM | POA: Diagnosis not present

## 2015-07-12 DIAGNOSIS — R05 Cough: Secondary | ICD-10-CM

## 2015-07-12 DIAGNOSIS — I10 Essential (primary) hypertension: Secondary | ICD-10-CM

## 2015-07-12 DIAGNOSIS — R300 Dysuria: Secondary | ICD-10-CM | POA: Diagnosis not present

## 2015-07-12 DIAGNOSIS — H9201 Otalgia, right ear: Secondary | ICD-10-CM

## 2015-07-12 DIAGNOSIS — R062 Wheezing: Secondary | ICD-10-CM | POA: Diagnosis not present

## 2015-07-12 DIAGNOSIS — R059 Cough, unspecified: Secondary | ICD-10-CM

## 2015-07-12 NOTE — Progress Notes (Deleted)
Patient ID: [name], female   DOB: [birth year], 82 y.o.   MRN: [id number]        PCP: [name], MD  Code Status: Full Code  Allergies  Allergen Reactions  . Biphosphate   . Codeine Swelling  . Morphine And Related Other (See Comments)    unknown  . Percocet [Oxycodone-Acetaminophen]     unknown  . Plavix [Clopidogrel Bisulfate] Other (See Comments)    RASH  . Sulfur Other (See Comments)    GI upset    Chief Complaint  Patient presents with  . Acute Visit    Follow up Nausea and Vomiting      HPI:  82 y.o. patient is here for short term rehabilitation post hospital admission from   Review of Systems:  Constitutional: Negative for fever, chills, malaise/fatigue and diaphoresis.  HENT: Negative for headache, congestion, nasal discharge, hearing loss, earache, sore throat, difficulty swallowing.   Eyes: Negative for eye pain, blurred vision, double vision and discharge.  Respiratory: Negative for cough, shortness of breath and wheezing.   Cardiovascular: Negative for chest pain, palpitations, leg swelling.  Gastrointestinal: Negative for heartburn, nausea, vomiting, abdominal pain, loss of appetite, melena, diarrhea and constipation.  Genitourinary: Negative for dysuria, urgency, frequency, hematuria, incontinence and flank pain.  Musculoskeletal: Negative for back pain, falls, joint pain and myalgias. assistive device used Skin: Negative for itching, sores and rash.  Neurological: Negative for weakness,dizziness, tingling, focal weakness Psychiatric/Behavioral: Negative for depression, anxiety, insomnia and memory loss.    Past Medical History  Diagnosis Date  . Arthritis   . Right ear pain   . Renal insufficiency   . RA (rheumatoid arthritis) ([place])   . PUD (peptic ulcer disease)   . GI bleed 2005  . GERD (gastroesophageal reflux disease)   . Hiatal hernia   . Hypothyroidism   . Osteoporosis   . Depression   . Anxiety   . Coronary artery disease       a. 2012 s/p NSTEMI/Cath: 95% apical LAD dzs, otw nonobs dzs-->Med Rx;  b. 05/2011 MV: no ischemia, EF 79%, inf infarct- attenuation.  . CVA (cerebral vascular accident) ([place])   . TIA (transient ischemic attack)   . Fall   . DVT of lower extremity (deep venous thrombosis) ([place])   . HCAP (healthcare-associated pneumonia) 01/22/2013  . Enteritis due to Clostridium difficile 01/04/2013  . Sepsis ([place]) 12/19/2012  . Recurrent colitis due to Clostridium difficile 06/02/2012  . Essential hypertension   . Hyperlipidemia   . Fatigue   . Weight loss   . Hemorrhoids   . Malnutrition ([place])   . Dyspnea   . Elevated troponin     a. 10/2014 in setting of sepsis/?HCAP.  Marland Kitchen Chronic diastolic CHF (congestive heart failure) ([place])     a. 10/2014 Echo: EF 60-65%, mild LVH, grade 1 DD, dynamic obstruction @ rest - peak velocity of 271 cm/sec, peak grad of 58mmHg, PASP 75mmHg.  Marland Kitchen Obstructive hypertrophic cardiomyopathy ([place])     a. 10/2014 Echo: EF 60-65%, mild LVH, grade 1 DD, dynamic obstruction @ rest - peak velocity of 271 cm/sec, peak grad of 39mmHg, PASP 76mmHg.   Past Surgical History  Procedure Laterality Date  . Nstemi  06/2010  . Spinal fusion surgery    . Knee surgery    . Cardiac catheterization      SHOWED RUPTURE PLAQUE IN THE LAD. THE LAD IS NONOBSTRUCTIVE WITH ONLY 30-40% STENOSIS  . Back surgery    . Breast surgery  1964    x3  . Ankle surgery    . Colonoscopy     Social History:   reports that she has never smoked. She has never used smokeless tobacco. She reports that she does not drink alcohol or use illicit drugs.  Family History  Problem Relation Age of Onset  . Kidney disease Mother   . Kidney disease Brother   . Lung cancer Father     Medications:   Medication List       This list is accurate as of: 07/12/15 11:31 AM.  Always use your most recent med list.               ACT DRY MOUTH Lozg  Use as directed in the mouth or throat as needed (for dry mouth).      aspirin 81 MG chewable tablet  Chew 81 mg by mouth daily.     calcium-vitamin D 500-200 MG-UNIT tablet  Commonly known as:  OSCAL WITH D  Take 1 tablet by mouth 3 (three) times daily.     cetirizine 10 MG tablet  Commonly known as:  ZYRTEC  Take 10 mg by mouth daily.     Cranberry 200 MG Caps  Take 2 capsules by mouth 2 (two) times daily.     esomeprazole 40 MG capsule  Commonly known as:  NEXIUM  Take one capsule by mouth twice daily for stomach     ferrous sulfate 325 (65 FE) MG tablet  Take 325 mg by mouth 3 (three) times daily with meals.     Fish Oil 1200 MG Caps  Take 1,200 mg by mouth daily.     HYDROcodone-acetaminophen 5-325 MG tablet  Commonly known as:  NORCO/VICODIN  Take one tablet by mouth twice daily for pain. Take one tablet by mouth every 8 hours as needed for pain. Hold for sedation. Do not exceed 4gm of Tylenol in 24 hours     ipratropium 0.03 % nasal spray  Commonly known as:  ATROVENT  Place 2 sprays into both nostrils daily as needed (For asthma).     levothyroxine 50 MCG tablet  Commonly known as:  SYNTHROID, LEVOTHROID  Take 50 mcg by mouth daily.     lidocaine 5 %  Commonly known as:  LIDODERM  Place 1 patch onto the skin daily. Remove & Discard patch within 12 hours or as directed by MD     LORazepam 0.5 MG tablet  Commonly known as:  ATIVAN  Take one tablet by mouth every night at bedtime for anxiety     methocarbamol 500 MG tablet  Commonly known as:  ROBAXIN  Take 500 mg by mouth at bedtime.     metoprolol tartrate 25 MG tablet  Commonly known as:  LOPRESSOR  Take 25 mg by mouth 2 (two) times daily.     montelukast 10 MG tablet  Commonly known as:  SINGULAIR  Take 10 mg by mouth at bedtime.     multivitamin tablet  Take 1 tablet by mouth daily.     ondansetron 4 MG tablet  Commonly known as:  ZOFRAN  Take one tablet by mouth every 6 hours as needed for nausea/vomiting     PATADAY 0.2 % Soln  Generic drug:  Olopatadine HCl    1 drop daily.     predniSONE 10 MG tablet  Commonly known as:  DELTASONE  Take 1 tablet by mouth daily.     PROLIA 60 MG/ML Soln injection  Generic drug:  denosumab  Inject 60 mLs into the skin every 6 (six) months.     promethazine 25 MG tablet  Commonly known as:  PHENERGAN  Take 25 mg by mouth 2 (two) times daily as needed.     rosuvastatin 20 MG tablet  Commonly known as:  CRESTOR  Take 20 mg by mouth every evening.     saccharomyces boulardii 250 MG capsule  Commonly known as:  FLORASTOR  Take 250 mg by mouth 2 (two) times daily.     sertraline 50 MG tablet  Commonly known as:  ZOLOFT  Take 50 mg by mouth every morning.     Vitamin D 2000 units Caps  Take 1 capsule (2,000 Units total) by mouth daily.         Physical Exam: *** Filed Vitals:   07/12/15 1118  BP: 146/79  Pulse: 88  Temp: 97.3 F (36.3 C)  TempSrc: Oral  Resp: 20  Height: 5\' 5"  (1.651 m)  Weight: 145 lb (65.772 kg)  SpO2: 95%    General- elderly female, well built, in no acute distress Head- normocephalic, atraumatic Ears- left ear normal tympanic membrane and normal external ear canal , right ear normal tympanic membrane and normal external ear canal Nose- normal nasal mucosa, no maxillary or frontal sinus tenderness, no nasal discharge Throat- moist mucus membrane, normal oropharynx, dentition is  Eyes- PERRLA, EOMI, no pallor, no icterus, no discharge, normal conjunctiva, normal sclera Neck- no cervical lymphadenopathy, no supraclavicular lymphadenopathy, no thyromegaly, no jugular vein distension, no carotid bruit Chest- no chest wall deformities, no chest wall tenderness Breast- normal appearance, no masses or lumps on palpation, normal nipple and areola exam, no axillary lymphadenopathy Cardiovascular- normal s1,s2, no murmurs/ rubs/ gallops, dorsalis pedis and radial pulses, leg edema Respiratory- bilateral clear to auscultation, no wheeze, no rhonchi, no crackles, no use of  accessory muscles Abdomen- bowel sounds present, soft, non tender, no organomegaly, no abdominal bruits, no guarding or rigidity, no CVA tenderness Pelvic exam- normal pelvic exam, no adenexal or cervical motion tenderness Musculoskeletal- able to move all 4 extremities, no spinal and paraspinal tenderness, normal back curvature, steady gait, no use of assistive device, normal range of motion Neurological- no focal deficit, alert and oriented to person, place and time, normal reflexes, normal muscle strength, normal sensation to fine touch and vibration Skin- warm and dry Nails- hypertrophy, ingrown Psychiatry- normal mood and affect    Labs reviewed: Basic Metabolic Panel:  Recent Labs  10/29/14 1659  11/02/14 0846 11/03/14 0350 11/04/14 0519  06/23/15 06/26/15 06/28/15 1555  NA 137  < > 140 136 141  < > 140 146 142  K 4.1  < > 3.4* 3.5 3.4*  < > 4.1 4.0 3.9  CL 104  < > 109 106 107  --   --   --  103  CO2 13*  < > 24 23 25   --   --   --  29  GLUCOSE 352*  < > 89 100* 123*  --   --   --  158*  BUN 21*  < > 32* 28* 27*  < > 26* 28* 29*  CREATININE 1.80*  < > 1.54* 1.70* 1.72*  < > 1.4* 1.4* 1.47*  CALCIUM 9.1  < > 8.0* 7.9* 8.4*  --   --   --  10.2  MG 1.9  --  1.7 2.0  --   --   --   --   --   PHOS  4.8*  --   --   --   --   --   --   --   --   < > = values in this interval not displayed. Liver Function Tests:  Recent Labs  10/29/14 1029 10/29/14 1659 10/30/14 0414  AST 39 44* 41  ALT 19 23 25   ALKPHOS 47 47 48  BILITOT 1.5* 0.8 0.9  PROT 6.3* 6.3* 6.7  ALBUMIN 3.3* 3.4* 3.3*   No results for input(s): LIPASE, AMYLASE in the last 8760 hours. No results for input(s): AMMONIA in the last 8760 hours. CBC:  Recent Labs  10/29/14 1029  11/02/14 0846 11/03/14 0350  03/28/15 06/26/15 06/28/15 1555  WBC 14.1*  < > 12.0* 11.7*  < > 11.6 9.7 13.9*  NEUTROABS 11.4*  --   --   --   --   --   --  12.5*  HGB 11.8*  < > 10.1* 9.5*  < > 10.8* 11.1* 12.4  HCT 38.2  < > 31.5*  29.6*  < > 34* 35* 38.2  MCV 90.5  < > 89.2 87.8  --   --   --  87.5  PLT 110*  < > 107* 109*  < > 174 130* 173.0  < > = values in this interval not displayed. Cardiac Enzymes:  Recent Labs  10/31/14 0500 10/31/14 1056 10/31/14 1714  TROPONINI 0.56* 0.47* 0.38*   BNP: Invalid input(s): POCBNP CBG:  Recent Labs  11/04/14 1641 11/05/14 0747 11/06/14 0751  GLUCAP 210* 148* 112*    Radiological Exams: Dg Chest 1 View  06/28/2015  CLINICAL DATA:  Follow-up pneumonia. EXAM: CHEST 1 VIEW COMPARISON:  Chest radiograph 11/02/2014 FINDINGS: Stable cardiac and mediastinal contours. Large hiatal hernia. Significant interval improvement in previously described airspace opacities with minimal residual heterogeneous opacity left lung base. No pleural effusion or pneumothorax. There appears to be interval fracture of the right Harrington rod. IMPRESSION: Near complete resolution of previously described bilateral airspace opacities. Minimal residual atelectasis left lung base. Large hiatal hernia. Interval fracture of the mid aspect of the right Harrington rod. Electronically Signed   By: Lovey Newcomer M.D.   On: 06/28/2015 16:42    EKG: Independently reviewed. ***  Assessment/Plan No problem-specific assessment & plan notes found for this encounter.      Goals of care: short term rehabilitation   Labs/tests ordered:  Family/ staff Communication: reviewed care plan with patient and nursing supervisor    [name], MD  [facility] [phone] (Monday-Friday 8 am - 5 pm) [phone] (afterhours)

## 2015-07-12 NOTE — Progress Notes (Signed)
Patient ID: [name], female   DOB: 10/24/1929, 82 y.o.   MRN: [id number]      Facility: [facility]    PCP: [name], MD  Code Status: Full Code  Allergies  Allergen Reactions  . Biphosphate   . Codeine Swelling  . Morphine And Related Other (See Comments)    unknown  . Percocet [Oxycodone-Acetaminophen]     unknown  . Plavix [Clopidogrel Bisulfate] Other (See Comments)    RASH  . Sulfur Other (See Comments)    GI upset    Chief Complaint  Patient presents with  . Acute Visit    Nausea, cough and wheezing, right ear pain     HPI:  82 year old patient is seen for acute visit. Her vomiting has subsided but continues to have nausea which is overall improved from 2 days back. She is on bland diet but has poor appetite. She complaints of cough with wheezing today. The cough is dry. Denies any runny nose or sore throat. Has been afebrile. Her daughter is present at bedside. She also complaints of right ear pain x 3 days.    Review of Systems:  Constitutional: Negative for chills, diaphoresis.  HENT: Negative for headache, congestion, nasal discharge Eyes: Negative for double vision and discharge.  Respiratory: Negative for shortness of breath.   Cardiovascular: Negative for chest pain, palpitations Gastrointestinal: Negative for heartburn, abdominal pain Genitourinary: Negative for dysuria  Neurological: Negative for dizziness   Past Medical History  Diagnosis Date  . Arthritis   . Right ear pain   . Renal insufficiency   . RA (rheumatoid arthritis) ([place])   . PUD (peptic ulcer disease)   . GI bleed 2005  . GERD (gastroesophageal reflux disease)   . Hiatal hernia   . Hypothyroidism   . Osteoporosis   . Depression   . Anxiety   . Coronary artery disease     a. 2012 s/p NSTEMI/Cath: 95% apical LAD dzs, otw nonobs dzs-->Med Rx;  b. 05/2011 MV: no ischemia, EF 79%, inf infarct- attenuation.  . CVA (cerebral vascular accident)  ([place])   . TIA (transient ischemic attack)   . Fall   . DVT of lower extremity (deep venous thrombosis) ([place])   . HCAP (healthcare-associated pneumonia) 01/22/2013  . Enteritis due to Clostridium difficile 01/04/2013  . Sepsis ([place]) 12/19/2012  . Recurrent colitis due to Clostridium difficile 06/02/2012  . Essential hypertension   . Hyperlipidemia   . Fatigue   . Weight loss   . Hemorrhoids   . Malnutrition ([place])   . Dyspnea   . Elevated troponin     a. 10/2014 in setting of sepsis/?HCAP.  Marland Kitchen Chronic diastolic CHF (congestive heart failure) ([place])     a. 10/2014 Echo: EF 60-65%, mild LVH, grade 1 DD, dynamic obstruction @ rest - peak velocity of 271 cm/sec, peak grad of 64mmHg, PASP 84mmHg.  Marland Kitchen Obstructive hypertrophic cardiomyopathy ([place])     a. 10/2014 Echo: EF 60-65%, mild LVH, grade 1 DD, dynamic obstruction @ rest - peak velocity of 271 cm/sec, peak grad of 53mmHg, PASP 41mmHg.    Medications: Patient's Medications  New Prescriptions   No medications on file  Previous Medications   ARTIFICIAL SALIVA (ACT DRY MOUTH) LOZG    Use as directed in the mouth or throat as needed (for dry mouth).   ASPIRIN 81 MG CHEWABLE TABLET    Chew 81 mg by mouth daily.   CALCIUM-VITAMIN D (OSCAL WITH D) 500-200  MG-UNIT PER TABLET    Take 1 tablet by mouth 3 (three) times daily.   CETIRIZINE (ZYRTEC) 10 MG TABLET    Take 10 mg by mouth daily.   CHOLECALCIFEROL (VITAMIN D) 2000 UNITS CAPS    Take 1 capsule (2,000 Units total) by mouth daily.   CRANBERRY 200 MG CAPS    Take 2 capsules by mouth 2 (two) times daily.   ESOMEPRAZOLE (NEXIUM) 40 MG CAPSULE    Take one capsule by mouth twice daily for stomach   FERROUS SULFATE 325 (65 FE) MG TABLET    Take 325 mg by mouth 3 (three) times daily with meals.   HYDROCODONE-ACETAMINOPHEN (NORCO/VICODIN) 5-325 MG TABLET    Take one tablet by mouth twice daily for pain. Take one tablet by mouth every 8 hours as needed for pain. Hold for sedation. Do not exceed 4gm of  Tylenol in 24 hours   IPRATROPIUM (ATROVENT) 0.03 % NASAL SPRAY    Place 2 sprays into both nostrils daily as needed (For asthma).    LEVOTHYROXINE (SYNTHROID, LEVOTHROID) 50 MCG TABLET    Take 50 mcg by mouth daily.    LIDOCAINE (LIDODERM) 5 %    Place 1 patch onto the skin daily. Remove & Discard patch within 12 hours or as directed by MD   LORAZEPAM (ATIVAN) 0.5 MG TABLET    Take one tablet by mouth every night at bedtime for anxiety   METHOCARBAMOL (ROBAXIN) 500 MG TABLET    Take 500 mg by mouth at bedtime.    METOPROLOL TARTRATE (LOPRESSOR) 25 MG TABLET    Take 25 mg by mouth 2 (two) times daily.   MONTELUKAST (SINGULAIR) 10 MG TABLET    Take 10 mg by mouth at bedtime.   MULTIPLE VITAMIN (MULTIVITAMIN) TABLET    Take 1 tablet by mouth daily.     OMEGA-3 FATTY ACIDS (FISH OIL) 1200 MG CAPS    Take 1,200 mg by mouth daily.   ONDANSETRON (ZOFRAN) 4 MG TABLET    Take one tablet by mouth every 6 hours as needed for nausea/vomiting   PATADAY 0.2 % SOLN    1 drop daily.   PREDNISONE (DELTASONE) 10 MG TABLET    Take 1 tablet by mouth daily.   PROLIA 60 MG/ML SOLN INJECTION    Inject 60 mLs into the skin every 6 (six) months.    PROMETHAZINE (PHENERGAN) 25 MG TABLET    Take 25 mg by mouth 2 (two) times daily as needed.   ROSUVASTATIN (CRESTOR) 20 MG TABLET    Take 20 mg by mouth every evening.    SACCHAROMYCES BOULARDII (FLORASTOR) 250 MG CAPSULE    Take 250 mg by mouth 2 (two) times daily.    SERTRALINE (ZOLOFT) 50 MG TABLET    Take 50 mg by mouth every morning.  Modified Medications   No medications on file  Discontinued Medications   ESOMEPRAZOLE (NEXIUM) 20 MG CAPSULE    Take 20 mg by mouth 2 (two) times daily before a meal.     Physical Exam: Filed Vitals:   07/12/15 1118  BP: 146/79  Pulse: 88  Temp: 97.3 F (36.3 C)  TempSrc: Oral  Resp: 20  Height: 5\' 5"  (1.651 m)  Weight: 145 lb (65.772 kg)  SpO2: 95%   Constitutional: elderly female in no distress HEENT: normocephalic,  atraumatic.  Eyes: No icterus or pallor Ears: normal ear canal, normal tympanic membrane Neck: Neck supple. No JVD present. No cervical spine tenderness. Normal oropharynx.  Cardiovascular: Normal rate, regular rhythm. S1, S2, no murmurs, rubs, or gallops. Intact distal pulses.   Respiratory: equal air entry, wheezing bilateral, no crackles, no use of accessory muscles  GI: Soft. Bowel sounds are normal. She exhibits no distension. There is no tenderness.  Musculoskeletal:  exhibits no edema.  Neurological: alert and oriented to person, place, and time.  Skin: warm and dry.   Psychiatric: normal mood and affect.    Labs reviewed: Basic Metabolic Panel:  Recent Labs  10/29/14 1659  11/02/14 0846 11/03/14 0350 11/04/14 0519  06/23/15 06/26/15 06/28/15 1555  NA 137  < > 140 136 141  < > 140 146 142  K 4.1  < > 3.4* 3.5 3.4*  < > 4.1 4.0 3.9  CL 104  < > 109 106 107  --   --   --  103  CO2 13*  < > 24 23 25   --   --   --  29  GLUCOSE 352*  < > 89 100* 123*  --   --   --  158*  BUN 21*  < > 32* 28* 27*  < > 26* 28* 29*  CREATININE 1.80*  < > 1.54* 1.70* 1.72*  < > 1.4* 1.4* 1.47*  CALCIUM 9.1  < > 8.0* 7.9* 8.4*  --   --   --  10.2  MG 1.9  --  1.7 2.0  --   --   --   --   --   PHOS 4.8*  --   --   --   --   --   --   --   --   < > = values in this interval not displayed. Liver Function Tests:  Recent Labs  10/29/14 1029 10/29/14 1659 10/30/14 0414  AST 39 44* 41  ALT 19 23 25   ALKPHOS 47 47 48  BILITOT 1.5* 0.8 0.9  PROT 6.3* 6.3* 6.7  ALBUMIN 3.3* 3.4* 3.3*   No results for input(s): LIPASE, AMYLASE in the last 8760 hours. No results for input(s): AMMONIA in the last 8760 hours. CBC:  Recent Labs  10/29/14 1029  11/02/14 0846 11/03/14 0350  03/28/15 06/26/15 06/28/15 1555  WBC 14.1*  < > 12.0* 11.7*  < > 11.6 9.7 13.9*  NEUTROABS 11.4*  --   --   --   --   --   --  12.5*  HGB 11.8*  < > 10.1* 9.5*  < > 10.8* 11.1* 12.4  HCT 38.2  < > 31.5* 29.6*  < > 34* 35*  38.2  MCV 90.5  < > 89.2 87.8  --   --   --  87.5  PLT 110*  < > 107* 109*  < > 174 130* 173.0  < > = values in this interval not displayed. Cardiac Enzymes:  Recent Labs  10/31/14 0500 10/31/14 1056 10/31/14 1714  TROPONINI 0.56* 0.47* 0.38*   BNP: Invalid input(s): POCBNP CBG:  Recent Labs  11/04/14 1641 11/05/14 0747 11/06/14 0751  GLUCAP 210* 148* 112*     Assessment/Plan  Nausea  No more vomiting. Has not required additional doses of zofran or phenergan. Pending labs: amylase, lipase, cbc with diff and cmp, will need to review. With history of PUD and her nausea, change her aspirin to EC. Continue nexium 40 mg bid with her being on chronic prednisone  Cough With wheezing, given her recent multiple episodes of vomiting concern for aspiration exists. Will get CXR PA/Lat to  rule out pneumonitis. Start duoneb q6h x 5 days and then prn for wheezing. Monitor temperature curve.  Right ear pain Normal ear exam. Monitor clinically. Provide tylenol 650 mg bid x 5 days for pain  HTN Elevated bp this am, continue metoprolol 25 mg bid for now and check BP BID X 5 days and then daily for a week, if BP remains elevated, will need to adjust bp medication.  Reviewed care plan with patient and nursing supervisor   [name], MD  [facility] [phone] (Monday-Friday 8 am - 5 pm) [phone] (afterhours)

## 2015-07-13 DIAGNOSIS — D649 Anemia, unspecified: Secondary | ICD-10-CM | POA: Diagnosis not present

## 2015-07-14 LAB — POCT INR: INR: 1.7 — AB (ref <=1.1)

## 2015-07-22 ENCOUNTER — Inpatient Hospital Stay (HOSPITAL_COMMUNITY)
Admission: EM | Admit: 2015-07-22 | Discharge: 2015-07-26 | DRG: 871 | Disposition: A | Discharge status: Skilled Nursing Facility | Payer: Medicare Other | Attending: Internal Medicine | Admitting: Internal Medicine

## 2015-07-22 ENCOUNTER — Emergency Department (HOSPITAL_COMMUNITY): Payer: Medicare Other

## 2015-07-22 ENCOUNTER — Encounter (HOSPITAL_COMMUNITY): Payer: Self-pay | Admitting: Emergency Medicine

## 2015-07-22 DIAGNOSIS — R509 Fever, unspecified: Secondary | ICD-10-CM | POA: Diagnosis not present

## 2015-07-22 DIAGNOSIS — R278 Other lack of coordination: Secondary | ICD-10-CM | POA: Diagnosis not present

## 2015-07-22 DIAGNOSIS — R6520 Severe sepsis without septic shock: Secondary | ICD-10-CM | POA: Diagnosis present

## 2015-07-22 DIAGNOSIS — I252 Old myocardial infarction: Secondary | ICD-10-CM | POA: Diagnosis not present

## 2015-07-22 DIAGNOSIS — I2510 Atherosclerotic heart disease of native coronary artery without angina pectoris: Secondary | ICD-10-CM | POA: Diagnosis present

## 2015-07-22 DIAGNOSIS — N183 Chronic kidney disease, stage 3 unspecified: Secondary | ICD-10-CM | POA: Diagnosis present

## 2015-07-22 DIAGNOSIS — N179 Acute kidney failure, unspecified: Secondary | ICD-10-CM | POA: Diagnosis present

## 2015-07-22 DIAGNOSIS — Z87440 Personal history of urinary (tract) infections: Secondary | ICD-10-CM

## 2015-07-22 DIAGNOSIS — D649 Anemia, unspecified: Secondary | ICD-10-CM | POA: Diagnosis present

## 2015-07-22 DIAGNOSIS — E039 Hypothyroidism, unspecified: Secondary | ICD-10-CM | POA: Diagnosis present

## 2015-07-22 DIAGNOSIS — I428 Other cardiomyopathies: Secondary | ICD-10-CM

## 2015-07-22 DIAGNOSIS — Z7982 Long term (current) use of aspirin: Secondary | ICD-10-CM | POA: Diagnosis not present

## 2015-07-22 DIAGNOSIS — Z7952 Long term (current) use of systemic steroids: Secondary | ICD-10-CM | POA: Diagnosis not present

## 2015-07-22 DIAGNOSIS — F419 Anxiety disorder, unspecified: Secondary | ICD-10-CM | POA: Diagnosis present

## 2015-07-22 DIAGNOSIS — E860 Dehydration: Secondary | ICD-10-CM | POA: Diagnosis present

## 2015-07-22 DIAGNOSIS — A419 Sepsis, unspecified organism: Secondary | ICD-10-CM | POA: Diagnosis not present

## 2015-07-22 DIAGNOSIS — Z8673 Personal history of transient ischemic attack (TIA), and cerebral infarction without residual deficits: Secondary | ICD-10-CM | POA: Diagnosis not present

## 2015-07-22 DIAGNOSIS — I5032 Chronic diastolic (congestive) heart failure: Secondary | ICD-10-CM | POA: Diagnosis not present

## 2015-07-22 DIAGNOSIS — N390 Urinary tract infection, site not specified: Secondary | ICD-10-CM

## 2015-07-22 DIAGNOSIS — B9620 Unspecified Escherichia coli [E. coli] as the cause of diseases classified elsewhere: Secondary | ICD-10-CM | POA: Diagnosis not present

## 2015-07-22 DIAGNOSIS — M6281 Muscle weakness (generalized): Secondary | ICD-10-CM | POA: Diagnosis not present

## 2015-07-22 DIAGNOSIS — M069 Rheumatoid arthritis, unspecified: Secondary | ICD-10-CM | POA: Diagnosis present

## 2015-07-22 DIAGNOSIS — E785 Hyperlipidemia, unspecified: Secondary | ICD-10-CM | POA: Diagnosis present

## 2015-07-22 DIAGNOSIS — I10 Essential (primary) hypertension: Secondary | ICD-10-CM

## 2015-07-22 DIAGNOSIS — Z86718 Personal history of other venous thrombosis and embolism: Secondary | ICD-10-CM | POA: Diagnosis not present

## 2015-07-22 DIAGNOSIS — I130 Hypertensive heart and chronic kidney disease with heart failure and stage 1 through stage 4 chronic kidney disease, or unspecified chronic kidney disease: Secondary | ICD-10-CM | POA: Diagnosis present

## 2015-07-22 DIAGNOSIS — D509 Iron deficiency anemia, unspecified: Secondary | ICD-10-CM | POA: Diagnosis not present

## 2015-07-22 DIAGNOSIS — D696 Thrombocytopenia, unspecified: Secondary | ICD-10-CM | POA: Diagnosis present

## 2015-07-22 DIAGNOSIS — R2681 Unsteadiness on feet: Secondary | ICD-10-CM | POA: Diagnosis not present

## 2015-07-22 DIAGNOSIS — I272 Other secondary pulmonary hypertension: Secondary | ICD-10-CM

## 2015-07-22 DIAGNOSIS — R6889 Other general symptoms and signs: Secondary | ICD-10-CM | POA: Diagnosis not present

## 2015-07-22 DIAGNOSIS — M810 Age-related osteoporosis without current pathological fracture: Secondary | ICD-10-CM | POA: Diagnosis present

## 2015-07-22 DIAGNOSIS — J9601 Acute respiratory failure with hypoxia: Secondary | ICD-10-CM | POA: Diagnosis not present

## 2015-07-22 DIAGNOSIS — I129 Hypertensive chronic kidney disease with stage 1 through stage 4 chronic kidney disease, or unspecified chronic kidney disease: Secondary | ICD-10-CM | POA: Diagnosis present

## 2015-07-22 DIAGNOSIS — Z841 Family history of disorders of kidney and ureter: Secondary | ICD-10-CM | POA: Diagnosis not present

## 2015-07-22 DIAGNOSIS — Z9181 History of falling: Secondary | ICD-10-CM | POA: Diagnosis not present

## 2015-07-22 DIAGNOSIS — E2740 Unspecified adrenocortical insufficiency: Secondary | ICD-10-CM | POA: Diagnosis present

## 2015-07-22 DIAGNOSIS — G4700 Insomnia, unspecified: Secondary | ICD-10-CM | POA: Diagnosis present

## 2015-07-22 DIAGNOSIS — Z801 Family history of malignant neoplasm of trachea, bronchus and lung: Secondary | ICD-10-CM

## 2015-07-22 DIAGNOSIS — G9341 Metabolic encephalopathy: Secondary | ICD-10-CM | POA: Diagnosis present

## 2015-07-22 DIAGNOSIS — E038 Other specified hypothyroidism: Secondary | ICD-10-CM

## 2015-07-22 DIAGNOSIS — Z8711 Personal history of peptic ulcer disease: Secondary | ICD-10-CM

## 2015-07-22 DIAGNOSIS — A4151 Sepsis due to Escherichia coli [E. coli]: Secondary | ICD-10-CM | POA: Diagnosis present

## 2015-07-22 DIAGNOSIS — M05712 Rheumatoid arthritis with rheumatoid factor of left shoulder without organ or systems involvement: Secondary | ICD-10-CM

## 2015-07-22 DIAGNOSIS — R1310 Dysphagia, unspecified: Secondary | ICD-10-CM

## 2015-07-22 DIAGNOSIS — Z981 Arthrodesis status: Secondary | ICD-10-CM

## 2015-07-22 DIAGNOSIS — I421 Obstructive hypertrophic cardiomyopathy: Secondary | ICD-10-CM | POA: Diagnosis present

## 2015-07-22 DIAGNOSIS — K219 Gastro-esophageal reflux disease without esophagitis: Secondary | ICD-10-CM | POA: Diagnosis present

## 2015-07-22 DIAGNOSIS — I5030 Unspecified diastolic (congestive) heart failure: Secondary | ICD-10-CM

## 2015-07-22 LAB — I-STAT ARTERIAL BLOOD GAS, ED
Acid-Base Excess: 1 mmol/L (ref 0.0–2.0)
BICARBONATE: 25.3 meq/L — AB (ref 20.0–24.0)
O2 SAT: 99 %
Patient temperature: 102.6
TCO2: 26 mmol/L (ref 0–100)
pCO2 arterial: 43.9 mmHg (ref 35.0–45.0)
pH, Arterial: 7.378 (ref 7.350–7.450)
pO2, Arterial: 134 mmHg — ABNORMAL HIGH (ref 80.0–100.0)

## 2015-07-22 LAB — CBC WITH DIFFERENTIAL/PLATELET
BASOS PCT: 0 %
Basophils Absolute: 0 10*3/uL (ref 0.0–0.1)
EOS PCT: 0 %
Eosinophils Absolute: 0 10*3/uL (ref 0.0–0.7)
HEMATOCRIT: 39.7 % (ref 36.0–46.0)
Hemoglobin: 13 g/dL (ref 12.0–15.0)
LYMPHS ABS: 1.9 10*3/uL (ref 0.7–4.0)
Lymphocytes Relative: 7 %
MCH: 29 pg (ref 26.0–34.0)
MCHC: 32.7 g/dL (ref 30.0–36.0)
MCV: 88.6 fL (ref 78.0–100.0)
MONOS PCT: 7 %
Monocytes Absolute: 1.9 10*3/uL — ABNORMAL HIGH (ref 0.1–1.0)
NEUTROS PCT: 86 %
Neutro Abs: 24 10*3/uL — ABNORMAL HIGH (ref 1.7–7.7)
Platelets: 116 10*3/uL — ABNORMAL LOW (ref 150–400)
RBC: 4.48 MIL/uL (ref 3.87–5.11)
RDW: 14.5 % (ref 11.5–15.5)
WBC: 27.8 10*3/uL — AB (ref 4.0–10.5)

## 2015-07-22 LAB — URINALYSIS, ROUTINE W REFLEX MICROSCOPIC
Bilirubin Urine: NEGATIVE
GLUCOSE, UA: NEGATIVE mg/dL
Ketones, ur: 15 mg/dL — AB
Nitrite: POSITIVE — AB
PH: 5.5 (ref 5.0–8.0)
Protein, ur: >300 mg/dL — AB
Specific Gravity, Urine: 1.015 (ref 1.005–1.030)

## 2015-07-22 LAB — COMPREHENSIVE METABOLIC PANEL
ALBUMIN: 2.9 g/dL — AB (ref 3.5–5.0)
ALK PHOS: 66 U/L (ref 38–126)
ALT: 15 U/L (ref 14–54)
ANION GAP: 17 — AB (ref 5–15)
AST: 23 U/L (ref 15–41)
BILIRUBIN TOTAL: 1.1 mg/dL (ref 0.3–1.2)
BUN: 25 mg/dL — AB (ref 6–20)
CALCIUM: 11.3 mg/dL — AB (ref 8.9–10.3)
CO2: 26 mmol/L (ref 22–32)
Chloride: 101 mmol/L (ref 101–111)
Creatinine, Ser: 1.78 mg/dL — ABNORMAL HIGH (ref 0.44–1.00)
GFR calc Af Amer: 29 mL/min — ABNORMAL LOW (ref >60)
GFR calc non Af Amer: 25 mL/min — ABNORMAL LOW (ref >60)
GLUCOSE: 147 mg/dL — AB (ref 65–99)
Potassium: 3.5 mmol/L (ref 3.5–5.1)
SODIUM: 144 mmol/L (ref 135–145)
Total Protein: 6.3 g/dL — ABNORMAL LOW (ref 6.5–8.1)

## 2015-07-22 LAB — I-STAT CG4 LACTIC ACID, ED
LACTIC ACID, VENOUS: 1.62 mmol/L (ref 0.5–2.0)
Lactic Acid, Venous: 2.01 mmol/L (ref 0.5–2.0)

## 2015-07-22 LAB — PROCALCITONIN: Procalcitonin: 38.82 ng/mL

## 2015-07-22 LAB — URINE MICROSCOPIC-ADD ON

## 2015-07-22 LAB — INFLUENZA PANEL BY PCR (TYPE A & B)
H1N1 flu by pcr: NOT DETECTED
INFLBPCR: NEGATIVE
Influenza A By PCR: NEGATIVE

## 2015-07-22 LAB — MRSA PCR SCREENING: MRSA BY PCR: NEGATIVE

## 2015-07-22 LAB — TSH: TSH: 0.708 u[IU]/mL (ref 0.350–4.500)

## 2015-07-22 LAB — CORTISOL: Cortisol, Plasma: 20.7 ug/dL

## 2015-07-22 MED ORDER — VANCOMYCIN HCL IN DEXTROSE 750-5 MG/150ML-% IV SOLN
750.0000 mg | INTRAVENOUS | Status: DC
  Filled 2015-07-22: qty 150

## 2015-07-22 MED ORDER — IPRATROPIUM-ALBUTEROL 0.5-2.5 (3) MG/3ML IN SOLN: 3.0000 mL | Freq: Four times a day (QID) | RESPIRATORY_TRACT | Status: DC | PRN

## 2015-07-22 MED ORDER — VANCOMYCIN HCL IN DEXTROSE 1-5 GM/200ML-% IV SOLN
1000.0000 mg | Freq: Once | INTRAVENOUS | Status: AC
  Administered 2015-07-22: 1000 mg via INTRAVENOUS
  Filled 2015-07-22: qty 200

## 2015-07-22 MED ORDER — ACETAMINOPHEN 325 MG PO TABS
650.0000 mg | ORAL_TABLET | Freq: Four times a day (QID) | ORAL | Status: DC | PRN
  Administered 2015-07-24: 650 mg via ORAL
  Filled 2015-07-22: qty 2

## 2015-07-22 MED ORDER — SODIUM CHLORIDE 0.9 % IV BOLUS (SEPSIS)
1000.0000 mL | Freq: Once | INTRAVENOUS | Status: AC
  Administered 2015-07-22: 1000 mL via INTRAVENOUS

## 2015-07-22 MED ORDER — PREDNISONE 10 MG PO TABS: 10.0000 mg | ORAL_TABLET | Freq: Every day | ORAL | Status: DC

## 2015-07-22 MED ORDER — SODIUM CHLORIDE 0.9 % IV BOLUS (SEPSIS)
1000.0000 mL | INTRAVENOUS | Status: AC
  Administered 2015-07-22: 1000 mL via INTRAVENOUS

## 2015-07-22 MED ORDER — SODIUM CHLORIDE 0.9 % IJ SOLN
3.0000 mL | Freq: Two times a day (BID) | INTRAMUSCULAR | Status: DC
  Administered 2015-07-22 – 2015-07-26 (×8): 3 mL via INTRAVENOUS

## 2015-07-22 MED ORDER — ONDANSETRON HCL 4 MG/2ML IJ SOLN
4.0000 mg | Freq: Four times a day (QID) | INTRAMUSCULAR | Status: DC | PRN
  Administered 2015-07-23 – 2015-07-26 (×8): 4 mg via INTRAVENOUS
  Filled 2015-07-22 (×8): qty 2

## 2015-07-22 MED ORDER — SODIUM CHLORIDE 0.9 % IV SOLN
INTRAVENOUS | Status: DC
  Administered 2015-07-22 – 2015-07-23 (×3): via INTRAVENOUS

## 2015-07-22 MED ORDER — LEVOTHYROXINE SODIUM 100 MCG IV SOLR: 25.0000 ug | Freq: Every day | INTRAVENOUS | Status: DC

## 2015-07-22 MED ORDER — PIPERACILLIN-TAZOBACTAM 3.375 G IVPB
3.3750 g | Freq: Three times a day (TID) | INTRAVENOUS | Status: DC
  Administered 2015-07-22 – 2015-07-24 (×6): 3.375 g via INTRAVENOUS
  Filled 2015-07-22 (×8): qty 50

## 2015-07-22 MED ORDER — ACETAMINOPHEN 650 MG RE SUPP
650.0000 mg | Freq: Four times a day (QID) | RECTAL | Status: DC | PRN
  Administered 2015-07-23: 650 mg via RECTAL
  Filled 2015-07-22: qty 1

## 2015-07-22 MED ORDER — ACETAMINOPHEN 500 MG PO TABS
1000.0000 mg | ORAL_TABLET | Freq: Once | ORAL | Status: AC
  Administered 2015-07-22: 1000 mg via ORAL
  Filled 2015-07-22: qty 2

## 2015-07-22 MED ORDER — LEVOTHYROXINE SODIUM 100 MCG IV SOLR
25.0000 ug | Freq: Every day | INTRAVENOUS | Status: DC
  Administered 2015-07-23: 25 ug via INTRAVENOUS
  Filled 2015-07-22: qty 5

## 2015-07-22 MED ORDER — PIPERACILLIN-TAZOBACTAM 3.375 G IVPB 30 MIN
3.3750 g | Freq: Once | INTRAVENOUS | Status: AC
  Administered 2015-07-22: 3.375 g via INTRAVENOUS
  Filled 2015-07-22: qty 50

## 2015-07-22 MED ORDER — ENOXAPARIN SODIUM 30 MG/0.3ML ~~LOC~~ SOLN
30.0000 mg | SUBCUTANEOUS | Status: DC
Start: 2015-07-22 — End: 2015-07-23
  Administered 2015-07-22: 30 mg via SUBCUTANEOUS
  Filled 2015-07-22: qty 0.3

## 2015-07-22 NOTE — Progress Notes (Signed)
ANTIBIOTIC CONSULT NOTE - INITIAL  Pharmacy Consult for Zosyn and vancomycin Indication: Sepsis  Allergies  Allergen Reactions  . Biphosphate   . Codeine Swelling  . Morphine And Related Other (See Comments)    unknown  . Percocet [Oxycodone-Acetaminophen]     unknown  . Plavix [Clopidogrel Bisulfate] Other (See Comments)    RASH  . Sulfur Other (See Comments)    GI upset    Patient Measurements: Height: 5\' 5"  (165.1 cm) Weight: 145 lb (65.772 kg) IBW/kg (Calculated) : 57  Vital Signs: Temp: 102.6 F (39.2 C) (01/22 1103) Temp Source: Rectal (01/22 1103) BP: 145/66 mmHg (01/22 1145) Pulse Rate: 116 (01/22 1145) Intake/Output from previous day:   Intake/Output from this shift:    Labs:  Recent Labs  07/22/15 1123  WBC 27.8*  HGB 13.0  PLT PENDING   CrCl cannot be calculated (Patient has no serum creatinine result on file.). No results for input(s): VANCOTROUGH, VANCOPEAK, VANCORANDOM, GENTTROUGH, GENTPEAK, GENTRANDOM, TOBRATROUGH, TOBRAPEAK, TOBRARND, AMIKACINPEAK, AMIKACINTROU, AMIKACIN in the last 72 hours.   Microbiology: No results found for this or any previous visit (from the past 720 hour(s)).  Medical History: Past Medical History  Diagnosis Date  . Arthritis   . Right ear pain   . Renal insufficiency   . RA (rheumatoid arthritis) ([place])   . PUD (peptic ulcer disease)   . GI bleed 2005  . GERD (gastroesophageal reflux disease)   . Hiatal hernia   . Hypothyroidism   . Osteoporosis   . Depression   . Anxiety   . Coronary artery disease     a. 2012 s/p NSTEMI/Cath: 95% apical LAD dzs, otw nonobs dzs-->Med Rx;  b. 05/2011 MV: no ischemia, EF 79%, inf infarct- attenuation.  . CVA (cerebral vascular accident) ([place])   . TIA (transient ischemic attack)   . Fall   . DVT of lower extremity (deep venous thrombosis) ([place])   . HCAP (healthcare-associated pneumonia) 01/22/2013  . Enteritis due to Clostridium difficile 01/04/2013  . Sepsis ([place]) 12/19/2012   . Recurrent colitis due to Clostridium difficile 06/02/2012  . Essential hypertension   . Hyperlipidemia   . Fatigue   . Weight loss   . Hemorrhoids   . Malnutrition ([place])   . Dyspnea   . Elevated troponin     a. 10/2014 in setting of sepsis/?HCAP.  Marland Kitchen Chronic diastolic CHF (congestive heart failure) ([place])     a. 10/2014 Echo: EF 60-65%, mild LVH, grade 1 DD, dynamic obstruction @ rest - peak velocity of 271 cm/sec, peak grad of 17mmHg, PASP 21mmHg.  Marland Kitchen Obstructive hypertrophic cardiomyopathy ([place])     a. 10/2014 Echo: EF 60-65%, mild LVH, grade 1 DD, dynamic obstruction @ rest - peak velocity of 271 cm/sec, peak grad of 53mmHg, PASP 36mmHg.    Assessment: 82 yo F presents on 1/22 with worsening confusion, intermittent N/V and fever. Febrile up to 102.6, WBC elevated at 27.8. Pharmcy consulted to dose abx for sepsis. Given one dose of abx in the ED. SCr elevated at 1.78, CrCl ~11ml/min.  Goal of Therapy:  Vancomycin trough level 15-20 mcg/ml  Resolution of infection  Plan:  Continue Zosyn 3.375 gm IV q8h (4 hour infusion) Start vancomycin 750mg  IV Q24 Monitor clinical picture, renal function, VT at Css F/U C&S, abx deescalation / LOT  Elenor Quinones, PharmD, BCPS Clinical Pharmacist Pager [phone] 07/22/2015 12:19 PM

## 2015-07-22 NOTE — ED Provider Notes (Signed)
CSN: [id number]     Arrival date & time 07/22/15  1051 History   First MD Initiated Contact with Patient 07/22/15 1056     Chief Complaint  Patient presents with  . Fever     (Consider location/radiation/quality/duration/timing/severity/associated sxs/prior Treatment) Patient is a 82 y.o. female presenting with fever.  Fever  [name] is an 82 year old female resident at [facility] with an extensive past medical history notable for CVA and HFpFF presenting to the ED today with increasing confusion, N/V, fever of 102.2. Patient unable to provide history. Daughter explains that patient has been having increasing confusion and decreasing memory for the last month with intermittent fever, elevated BP and nausea. Phenergan had been used for her nausea previously and was too sedating. She uses Zofran, but remains nauseated.  Daughter found patient this morning with non bloody vomit on her and nursing home noted fever and increase in confusion over 3 days. Patient has recently had clear CXR for cough/wheezing and clean UA due to recurrent UTIs. [name] did admit to odorous, dark urine.  Past Medical History  Diagnosis Date  . Arthritis   . Right ear pain   . Renal insufficiency   . RA (rheumatoid arthritis) ([place])   . PUD (peptic ulcer disease)   . GI bleed 2005  . GERD (gastroesophageal reflux disease)   . Hiatal hernia   . Hypothyroidism   . Osteoporosis   . Depression   . Anxiety   . Coronary artery disease     a. 2012 s/p NSTEMI/Cath: 95% apical LAD dzs, otw nonobs dzs-->Med Rx;  b. 05/2011 MV: no ischemia, EF 79%, inf infarct- attenuation.  . CVA (cerebral vascular accident) ([place])   . TIA (transient ischemic attack)   . Fall   . DVT of lower extremity (deep venous thrombosis) ([place])   . HCAP (healthcare-associated pneumonia) 01/22/2013  . Enteritis due to Clostridium difficile 01/04/2013  . Sepsis ([place]) 12/19/2012  . Recurrent colitis due to  Clostridium difficile 06/02/2012  . Essential hypertension   . Hyperlipidemia   . Fatigue   . Weight loss   . Hemorrhoids   . Malnutrition ([place])   . Dyspnea   . Elevated troponin     a. 10/2014 in setting of sepsis/?HCAP.  Marland Kitchen Chronic diastolic CHF (congestive heart failure) ([place])     a. 10/2014 Echo: EF 60-65%, mild LVH, grade 1 DD, dynamic obstruction @ rest - peak velocity of 271 cm/sec, peak grad of 64mmHg, PASP 18mmHg.  Marland Kitchen Obstructive hypertrophic cardiomyopathy ([place])     a. 10/2014 Echo: EF 60-65%, mild LVH, grade 1 DD, dynamic obstruction @ rest - peak velocity of 271 cm/sec, peak grad of 61mmHg, PASP 75mmHg.   Past Surgical History  Procedure Laterality Date  . Nstemi  06/2010  . Spinal fusion surgery    . Knee surgery    . Cardiac catheterization      SHOWED RUPTURE PLAQUE IN THE LAD. THE LAD IS NONOBSTRUCTIVE WITH ONLY 30-40% STENOSIS  . Back surgery    . Breast surgery  1964    x3  . Ankle surgery    . Colonoscopy     Family History  Problem Relation Age of Onset  . Kidney disease Mother   . Kidney disease Brother   . Lung cancer Father    Social History  Substance Use Topics  . Smoking status: Never Smoker   . Smokeless tobacco: Never Used  . Alcohol Use: No  OB History    No data available     Review of Systems  Constitutional: Positive for fever.   Level 5 caveat due to altered mental status  Daughter says patient has had intermittent fever, cough. Denied knowledge of hematemesis, CP, abdominal pain, hematochezia, melena   Allergies  Biphosphate; Codeine; Morphine and related; Percocet; Plavix; and Sulfur  Home Medications   Prior to Admission medications   Medication Sig Start Date End Date Taking? Authorizing Provider  Artificial Saliva (ACT DRY MOUTH) LOZG Use as directed in the mouth or throat as needed (for dry mouth).    Historical Provider, MD  aspirin 81 MG chewable tablet Chew 81 mg by mouth daily.    Historical Provider, MD   calcium-vitamin D (OSCAL WITH D) 500-200 MG-UNIT per tablet Take 1 tablet by mouth 3 (three) times daily.    Historical Provider, MD  cetirizine (ZYRTEC) 10 MG tablet Take 10 mg by mouth daily.    Historical Provider, MD  Cholecalciferol (VITAMIN D) 2000 UNITS CAPS Take 1 capsule (2,000 Units total) by mouth daily. 11/21/13   [name], NP  Cranberry 200 MG CAPS Take 2 capsules by mouth 2 (two) times daily.    Historical Provider, MD  esomeprazole (NEXIUM) 40 MG capsule Take one capsule by mouth twice daily for stomach    Historical Provider, MD  ferrous sulfate 325 (65 FE) MG tablet Take 325 mg by mouth 3 (three) times daily with meals.    Historical Provider, MD  HYDROcodone-acetaminophen (NORCO/VICODIN) 5-325 MG tablet Take one tablet by mouth twice daily for pain. Take one tablet by mouth every 8 hours as needed for pain. Hold for sedation. Do not exceed 4gm of Tylenol in 24 hours 06/04/15   Tiffany L Reed, DO  ipratropium (ATROVENT) 0.03 % nasal spray Place 2 sprays into both nostrils daily as needed (For asthma).     Historical Provider, MD  levothyroxine (SYNTHROID, LEVOTHROID) 50 MCG tablet Take 50 mcg by mouth daily.     Historical Provider, MD  lidocaine (LIDODERM) 5 % Place 1 patch onto the skin daily. Remove & Discard patch within 12 hours or as directed by MD 04/04/15   Lauree Chandler, NP  LORazepam (ATIVAN) 0.5 MG tablet Take one tablet by mouth every night at bedtime for anxiety 05/17/15   Lauree Chandler, NP  methocarbamol (ROBAXIN) 500 MG tablet Take 500 mg by mouth at bedtime.     Historical Provider, MD  metoprolol tartrate (LOPRESSOR) 25 MG tablet Take 25 mg by mouth 2 (two) times daily.    Historical Provider, MD  montelukast (SINGULAIR) 10 MG tablet Take 10 mg by mouth at bedtime.    Historical Provider, MD  Multiple Vitamin (MULTIVITAMIN) tablet Take 1 tablet by mouth daily.      Historical Provider, MD  Omega-3 Fatty Acids (FISH OIL) 1200 MG CAPS Take 1,200 mg by  mouth daily.    Historical Provider, MD  ondansetron (ZOFRAN) 4 MG tablet Take one tablet by mouth every 6 hours as needed for nausea/vomiting    Historical Provider, MD  PATADAY 0.2 % SOLN 1 drop daily. 03/26/15   Historical Provider, MD  predniSONE (DELTASONE) 10 MG tablet Take 1 tablet by mouth daily. 03/08/15   Historical Provider, MD  PROLIA 60 MG/ML SOLN injection Inject 60 mLs into the skin every 6 (six) months.  03/28/15   Historical Provider, MD  promethazine (PHENERGAN) 25 MG tablet Take 25 mg by mouth 2 (two) times daily  as needed. 03/30/15   Historical Provider, MD  rosuvastatin (CRESTOR) 20 MG tablet Take 20 mg by mouth every evening.  07/05/13   [name], NP  saccharomyces boulardii (FLORASTOR) 250 MG capsule Take 250 mg by mouth 2 (two) times daily.     Historical Provider, MD  sertraline (ZOLOFT) 50 MG tablet Take 50 mg by mouth every morning.    Historical Provider, MD   BP 147/65 mmHg  Pulse 127  Temp(Src) 102.6 F (39.2 C) (Rectal)  Resp 34  Ht 5\' 5"  (1.651 m)  Wt 65.772 kg  BMI 24.13 kg/m2  SpO2 94%  LMP  (LMP Unknown) Physical Exam  Constitutional: She appears well-developed and well-nourished. Nasal cannula in place.  HENT:  Head: Normocephalic and atraumatic.  Mouth/Throat: Oropharynx is clear and moist.  Eyes: Pupils are equal, round, and reactive to light.  Neck: Normal range of motion. Neck supple.  Cardiovascular: Regular rhythm, normal heart sounds and intact distal pulses.  Tachycardia present.   Pulmonary/Chest: Effort normal and breath sounds normal. No respiratory distress. She has no wheezes.  Unable to auscultate middle and lower lung fields as patient was unable to be moved to her side or sit up  Abdominal: Soft. Bowel sounds are normal. There is no tenderness.  Musculoskeletal: She exhibits no edema.  Neurological: She is alert. She is disoriented. She exhibits normal muscle tone. Coordination normal.  Patient confused and not able to follow some  simple commands  Skin: Skin is warm and dry. No rash noted.  Psychiatric: She has a normal mood and affect. Her speech is delayed. She is slowed. Cognition and memory are impaired.  Nursing note and vitals reviewed.   ED Course  Procedures (including critical care time) Labs Review Labs Reviewed  COMPREHENSIVE METABOLIC PANEL - Abnormal; Notable for the following:    Glucose, Bld 147 (*)    BUN 25 (*)    Creatinine, Ser 1.78 (*)    Calcium 11.3 (*)    Total Protein 6.3 (*)    Albumin 2.9 (*)    GFR calc non Af Amer 25 (*)    GFR calc Af Amer 29 (*)    Anion gap 17 (*)    All other components within normal limits  CBC WITH DIFFERENTIAL/PLATELET - Abnormal; Notable for the following:    WBC 27.8 (*)    Platelets 116 (*)    Neutro Abs 24.0 (*)    Monocytes Absolute 1.9 (*)    All other components within normal limits  URINALYSIS, ROUTINE W REFLEX MICROSCOPIC (NOT AT [facility]) - Abnormal; Notable for the following:    Color, Urine AMBER (*)    APPearance TURBID (*)    Hgb urine dipstick LARGE (*)    Ketones, ur 15 (*)    Protein, ur >300 (*)    Nitrite POSITIVE (*)    Leukocytes, UA LARGE (*)    All other components within normal limits  URINE MICROSCOPIC-ADD ON - Abnormal; Notable for the following:    Squamous Epithelial / LPF 6-30 (*)    Bacteria, UA MANY (*)    All other components within normal limits  COMPREHENSIVE METABOLIC PANEL - Abnormal; Notable for the following:    Chloride 112 (*)    BUN 22 (*)    Creatinine, Ser 1.61 (*)    Calcium 8.8 (*)    Total Protein 4.9 (*)    Albumin 2.1 (*)    ALT 13 (*)    GFR calc non Af  Amer 28 (*)    GFR calc Af Amer 33 (*)    All other components within normal limits  CBC - Abnormal; Notable for the following:    WBC 20.7 (*)    RBC 3.42 (*)    Hemoglobin 9.9 (*)    HCT 30.9 (*)    Platelets 94 (*)    All other components within normal limits  I-STAT CG4 LACTIC ACID, ED - Abnormal; Notable for the following:    Lactic  Acid, Venous 2.01 (*)    All other components within normal limits  I-STAT ARTERIAL BLOOD GAS, ED - Abnormal; Notable for the following:    pO2, Arterial 134.0 (*)    Bicarbonate 25.3 (*)    All other components within normal limits  CULTURE, BLOOD (ROUTINE X 2)  CULTURE, BLOOD (ROUTINE X 2)  URINE CULTURE  MRSA PCR SCREENING  RESPIRATORY VIRUS PANEL  CULTURE, BLOOD (ROUTINE X 2)  CULTURE, BLOOD (ROUTINE X 2)  INFLUENZA PANEL BY PCR (TYPE A & B, H1N1)  CORTISOL  PROCALCITONIN  TSH  I-STAT CG4 LACTIC ACID, ED    Imaging Review Dg Chest 2 View  07/22/2015  CLINICAL DATA:  Fever and hypoxia EXAM: CHEST - 2 VIEW COMPARISON:  06/28/2015 FINDINGS: Cardiac shadow is stable. A hiatal hernia is again seen. Fixation hardware is again noted within the thoracolumbar spine. The previously noted fracture of the right Harrington rod is again visualized. No acute bony abnormality is seen. No focal infiltrate is noted. IMPRESSION: No acute abnormality seen. Electronically Signed   By: Inez Catalina M.D.   On: 07/22/2015 13:27   Dg Chest Port 1 View  07/23/2015  CLINICAL DATA:  Sepsis EXAM: PORTABLE CHEST 1 VIEW COMPARISON:  July 22, 2015 FINDINGS: There is slight left base atelectasis. Lungs elsewhere clear. Heart is upper normal in size with pulmonary vascularity within normal limits. Hiatal hernia present. Rod fixation in the thoracic and lumbar regions is stable. There is atherosclerotic calcification in the aorta. There is evidence of old trauma involving the proximal right humerus. IMPRESSION: Slight left base atelectasis. No edema or consolidation. No change in cardiac silhouette. Hiatal hernia present. Electronically Signed   By: Lowella Grip III M.D.   On: 07/23/2015 07:04   I have personally reviewed and evaluated these images and lab results as part of my medical decision-making.   EKG Interpretation   Date/Time:  Sunday July 22 2015 11:01:23 EST Ventricular Rate:  130 PR  Interval:  122 QRS Duration: 90 QT Interval:  319 QTC Calculation: 469 R Axis:   -4 Text Interpretation:  Sinus tachycardia Ventricular premature complex  Aberrant complex Left ventricular hypertrophy Inferior infarct, age  indeterminate No significant change since last tracing Confirmed by  Winfred Leeds  MD, SAM [phone]) on 07/22/2015 12:12:34 PM      CRITICAL CARE Performed by: Brent General Total critical care time:30 minutes Critical care time was exclusive of separately billable procedures and treating other patients. Critical care was necessary to treat or prevent imminent or life-threatening deterioration. Critical care was time spent personally by me on the following activities: development of treatment plan with patient and/or surrogate as well as nursing, discussions with consultants, evaluation of patient's response to treatment, examination of patient, obtaining history from patient or surrogate, ordering and performing treatments and interventions, ordering and review of laboratory studies, ordering and review of radiographic studies, pulse oximetry and re-evaluation of patient's condition.  Patient will be admitted to the hospital for sepsis.  She  does have a tachycardia, fever, oxygen demand and has 2 IVs.  Amylase advised of the plan and all questions were answered.  I spoke with the [facility] who will come to admit the patient   Dalia Heading, PA-C 07/24/15 [place], MD 08/03/15 [phone]

## 2015-07-22 NOTE — ED Provider Notes (Signed)
Level V caveat dementia. History is obtained from patient's daughter and from records accompanying her and from EMS. Patient has been more confused over the past 2 weeks. Today she was noted to have fever. She had vomited earlier today. Presently patient offers no specific complaint. Daughter also states she's been coughing over the past few weeks. On exam alert follow simple commands, talkative HEENT exam extremities dry neck supple lungs clear to auscultation abdomen obese nontender extremities without edema. Code sepsis called, sirs criteria include temperature, respiratory rate, pulse source of infection unclear at time of initial exam  Orlie Dakin, MD 07/22/15 1709

## 2015-07-22 NOTE — ED Notes (Signed)
Three days of worsening confusion, intermittent nausea and vomiting. Fever 102.2 at facility.

## 2015-07-22 NOTE — H&P (Signed)
[facility] History and Physical                                                                                    [name], is a 82 y.o. female  MRN: [id number]   DOB - 1929/11/24  Admit Date - 07/22/2015  Outpatient Primary MD for the patient is [name], MD  Referring MD: Winfred Leeds / ER  PMH: Past Medical History  Diagnosis Date  . Arthritis   . Right ear pain   . Renal insufficiency   . RA (rheumatoid arthritis) ([place])   . PUD (peptic ulcer disease)   . GI bleed 2005  . GERD (gastroesophageal reflux disease)   . Hiatal hernia   . Hypothyroidism   . Osteoporosis   . Depression   . Anxiety   . Coronary artery disease     a. 2012 s/p NSTEMI/Cath: 95% apical LAD dzs, otw nonobs dzs-->Med Rx;  b. 05/2011 MV: no ischemia, EF 79%, inf infarct- attenuation.  . CVA (cerebral vascular accident) ([place])   . TIA (transient ischemic attack)   . Fall   . DVT of lower extremity (deep venous thrombosis) ([place])   . HCAP (healthcare-associated pneumonia) 01/22/2013  . Enteritis due to Clostridium difficile 01/04/2013  . Sepsis ([place]) 12/19/2012  . Recurrent colitis due to Clostridium difficile 06/02/2012  . Essential hypertension   . Hyperlipidemia   . Fatigue   . Weight loss   . Hemorrhoids   . Malnutrition ([place])   . Dyspnea   . Elevated troponin     a. 10/2014 in setting of sepsis/?HCAP.  Marland Kitchen Chronic diastolic CHF (congestive heart failure) ([place])     a. 10/2014 Echo: EF 60-65%, mild LVH, grade 1 DD, dynamic obstruction @ rest - peak velocity of 271 cm/sec, peak grad of 53mHg, PASP 463mg.  . [place]struct[place] hypertrophic cardiomyopathy [place]    a. 10/2014 Echo: EF 60-65%, mild LVH, grade 1 DD, dynamic obstruction @ rest - peak velocity of 271 cm/sec, peak grad of 2999m, PASP 26m80m      PSH: Past Surgical History  Procedure Laterality Date  . Nstemi  06/2010  . Spinal fusion surgery    . Knee surgery    . Cardiac catheterization      SHOWED RUPTURE PLAQUE IN THE LAD.  THE LAD IS NONOBSTRUCTIVE WITH ONLY 30-40% STENOSIS  . Back surgery    . Breast surgery  1964    x3  . Ankle surgery    . Colonoscopy       CC:  Chief Complaint  Patient presents with  . Fever     HPI: This is an 85 f48ale patient [facility] who presented to the hospital report of fever. Patient has a past medical history of old CVA and resultant dysphagia, CAD, hypothyroidism, rheumatoid arthritis on chronic steroids, hypertension and associated HOCM with chronic diastolic heart failure, dyslipidemia, anemia and chronic kidney disease. Per nursing home records patient developed nausea and vomiting without abdominal pain or diarrhea on 1/10; this was associated with fever. Urinalysis had been obtained and was negative for UTI. Follow-up evaluation on 1/12  and a straight resolution of vomiting the patient was having coughing with wheezing and concerns were for possible aspiration so a chest x-ray was obtained that did not reveal evidence of pneumonia or other focal infiltrate. According to family at bedside and patient apparently had some improvement although for the past 1 week she has really not eaten much. The past 2 days patient has developed progressive confusion and recurrence of her nausea and vomiting and much higher fevers now up to 102.2 at the facility. During initial episodic nausea and again have been tried but was too sedating therefore patient was transitioned and Zofran but remained nauseated. Of note recently patient has had change in urine to being more malodorous and dark.  ER Evaluation and treatment: Temperature 102.6 rectal, initial blood pressure 159/76 with a pulse of 131 and respirations 32, nasal cannula oxygen in place at 2 L with O2 sat duration is 93%. After treatments initiated in the ER patient's blood pressure down to 127 or 65, pulse down to 104, O2 saturations up to 100% on 2 L oxygen. Two-view chest x-ray: No  acute abnormality EKG: Sinus tachycardia with ventricular rate 130 bpm, QTC 469 ms, voltage criteria met for LVH and otherwise EKG unchanged from previous scan June 2016 Abnormal labs: BUN 25 and creatinine 1.78, calcium 11.3, initial lactic acid 2.01 but down to 1.62 after treatments and fluids, WBC 27,800 with neutrophils 86% and absolute neutrophils 24%, platelets 116,000, urinalysis markedly abnormal with turbid appearance, many bacteria, 15 ketones, large leukocytes, positive nitrite, greater than 300 protein, WBCs too numerous to count  Review of Systems   Unable to obtain from patient due to altered mentation; history obtained from medical record and report from skilled nursing facility  Social History Social History  Substance Use Topics  . Smoking status: Never Smoker   . Smokeless tobacco: Never Used  . Alcohol Use: No    Resides at: [facility]  Lives with: N/A  Ambulatory status: Previously has been documented as a moderate assist 2+ personnel utilizing a rolling walker   Family History Family History  Problem Relation Age of Onset  . Kidney disease Mother   . Kidney disease Brother   . Lung cancer Father      Prior to Admission medications   Medication Sig Start Date End Date Taking? Authorizing Provider  Artificial Saliva (ACT DRY MOUTH) LOZG Use as directed in the mouth or throat as needed (for dry mouth).   Yes Historical Provider, MD  aspirin 81 MG chewable tablet Chew 81 mg by mouth daily.   Yes Historical Provider, MD  calcium-vitamin D (OSCAL WITH D) 500-200 MG-UNIT per tablet Take 1 tablet by mouth 3 (three) times daily.   Yes Historical Provider, MD  cetirizine (ZYRTEC) 10 MG tablet Take 10 mg by mouth daily.   Yes Historical Provider, MD  Cholecalciferol (VITAMIN D) 2000 UNITS CAPS Take 1 capsule (2,000 Units total) by mouth daily. 11/21/13  Yes [name], NP  Cranberry 200 MG CAPS Take 2 capsules by mouth 2 (two) times  daily.   Yes Historical Provider, MD  esomeprazole (NEXIUM) 40 MG capsule Take one capsule by mouth twice daily for stomach   Yes Historical Provider, MD  ferrous sulfate 325 (65 FE) MG tablet Take 325 mg by mouth 3 (three) times daily with meals.   Yes Historical Provider, MD  HYDROcodone-acetaminophen (NORCO/VICODIN) 5-325 MG tablet Take one tablet by mouth twice daily for pain. Take one tablet by  mouth every 8 hours as needed for pain. Hold for sedation. Do not exceed 4gm of Tylenol in 24 hours 06/04/15  Yes Tiffany L Reed, DO  ipratropium (ATROVENT) 0.03 % nasal spray Place 2 sprays into both nostrils daily as needed (For asthma).    Yes Historical Provider, MD  ipratropium-albuterol (DUONEB) 0.5-2.5 (3) MG/3ML SOLN Take 3 mLs by nebulization every 8 (eight) hours as needed.   Yes Historical Provider, MD  levothyroxine (SYNTHROID, LEVOTHROID) 50 MCG tablet Take 50 mcg by mouth daily.    Yes Historical Provider, MD  lidocaine (LIDODERM) 5 % Place 1 patch onto the skin daily. Remove & Discard patch within 12 hours or as directed by MD 04/04/15  Yes Lauree Chandler, NP  LORazepam (ATIVAN) 0.5 MG tablet Take one tablet by mouth every night at bedtime for anxiety 05/17/15  Yes Lauree Chandler, NP  methocarbamol (ROBAXIN) 500 MG tablet Take 500 mg by mouth at bedtime.    Yes Historical Provider, MD  metoprolol tartrate (LOPRESSOR) 25 MG tablet Take 25 mg by mouth 2 (two) times daily.   Yes Historical Provider, MD  montelukast (SINGULAIR) 10 MG tablet Take 10 mg by mouth at bedtime.   Yes Historical Provider, MD  Multiple Vitamin (MULTIVITAMIN) tablet Take 1 tablet by mouth daily.     Yes Historical Provider, MD  Omega-3 Fatty Acids (FISH OIL) 1200 MG CAPS Take 1,200 mg by mouth daily.   Yes Historical Provider, MD  ondansetron (ZOFRAN) 4 MG tablet Take one tablet by mouth every 6 hours as needed for nausea/vomiting   Yes Historical Provider, MD  PATADAY 0.2 % SOLN Place 1 drop into both eyes daily.   03/26/15  Yes Historical Provider, MD  predniSONE (DELTASONE) 10 MG tablet Take 1 tablet by mouth daily. 03/08/15  Yes Historical Provider, MD  PROLIA 60 MG/ML SOLN injection Inject 60 mLs into the skin every 6 (six) months.  03/28/15  Yes Historical Provider, MD  promethazine (PHENERGAN) 25 MG tablet Take 25 mg by mouth 2 (two) times daily as needed. 03/30/15  Yes Historical Provider, MD  rosuvastatin (CRESTOR) 20 MG tablet Take 20 mg by mouth every evening.  07/05/13  Yes [name], NP  saccharomyces boulardii (FLORASTOR) 250 MG capsule Take 250 mg by mouth 2 (two) times daily.    Yes Historical Provider, MD  sertraline (ZOLOFT) 50 MG tablet Take 50 mg by mouth every morning.   Yes Historical Provider, MD    Allergies  Allergen Reactions  . Biphosphate   . Codeine Swelling  . Morphine And Related Other (See Comments)    unknown  . Percocet [Oxycodone-Acetaminophen]     unknown  . Plavix [Clopidogrel Bisulfate] Other (See Comments)    RASH  . Sulfur Other (See Comments)    GI upset    Physical Exam  Vitals  Blood pressure 118/63, pulse 95, temperature 102.6 F (39.2 C), temperature source Rectal, resp. rate 20, height _0  (1.651 m), weight 65.772 kg (145 lb), SpO2 100 %.   General: Appears moderately toxic and lethargic, does not arouse to painful stimuli  Psych: Very lethargic and difficult to arouse-does not appear to have received sedative medications after arrival to ER  Neuro:   No obvious focal neurological deficits, CN II through XII intact, unable to test strength given patient's current lethargic altered mentation, Sensation intact all 4 extremities as noted by withdrawal of extremities with palpation.  ENT:  Ears and Eyes appear Normal, Conjunctivae clear sclera  mildly injected, PER. Dry oral mucosa without erythema or exudates.  Neck:  Supple, No lymphadenopathy appreciated  Respiratory:  Symmetrical chest wall movement, diminished at bases, coarse to  auscultation on 2 L oxygen   Cardiac:  RRR, No Murmurs, trace soft bilateral LE edema noted, no JVD, No carotid bruits, peripheral pulses palpable at 2+  Abdomen:  Positive bowel sounds, Soft, Non tender, Non distended,  No masses appreciated, no obvious hepatosplenomegaly  Skin:  No Cyanosis, poor Skin Turgor, No Skin Rash or Bruise.  Extremities: Symmetrical without obvious trauma or injury,  no effusions.  Data Review  CBC  Recent Labs Lab 07/22/15 1123  WBC 27.8*  HGB 13.0  HCT 39.7  PLT 116*  MCV 88.6  MCH 29.0  MCHC 32.7  RDW 14.5  LYMPHSABS 1.9  MONOABS 1.9*  EOSABS 0.0  BASOSABS 0.0    Chemistries   Recent Labs Lab 07/22/15 1123  NA 144  K 3.5  CL 101  CO2 26  GLUCOSE 147*  BUN 25*  CREATININE 1.78*  CALCIUM 11.3*  AST 23  ALT 15  ALKPHOS 66  BILITOT 1.1    estimated creatinine clearance is 21.5 mL/min (by C-G formula based on Cr of 1.78).  No results for input(s): TSH, T4TOTAL, T3FREE, THYROIDAB in the last 72 hours.  Invalid input(s): FREET3  Coagulation profile No results for input(s): INR, PROTIME in the last 168 hours.  No results for input(s): DDIMER in the last 72 hours.  Cardiac Enzymes No results for input(s): CKMB, TROPONINI, MYOGLOBIN in the last 168 hours.  Invalid input(s): CK  Invalid input(s): POCBNP  Urinalysis    Component Value Date/Time   COLORURINE AMBER* 07/22/2015 1110   COLORURINE Yellow 03/06/2013 1013   APPEARANCEUR TURBID* 07/22/2015 1110   APPEARANCEUR Hazy 03/06/2013 1013   LABSPEC 1.015 07/22/2015 1110   LABSPEC 1.013 03/06/2013 1013   PHURINE 5.5 07/22/2015 1110   PHURINE 8.0 03/06/2013 1013   GLUCOSEU NEGATIVE 07/22/2015 1110   GLUCOSEU Negative 03/06/2013 1013   HGBUR LARGE* 07/22/2015 1110   HGBUR Negative 03/06/2013 1013   BILIRUBINUR NEGATIVE 07/22/2015 1110   BILIRUBINUR Negative 03/06/2013 1013   KETONESUR 15* 07/22/2015 1110   KETONESUR Negative 03/06/2013 1013   PROTEINUR >300*  07/22/2015 1110   PROTEINUR Negative 03/06/2013 1013   UROBILINOGEN 0.2 12/18/2012 1203   NITRITE POSITIVE* 07/22/2015 1110   NITRITE Negative 03/06/2013 1013   LEUKOCYTESUR LARGE* 07/22/2015 1110   LEUKOCYTESUR 3+ 03/06/2013 1013    Imaging results:   Dg Chest 1 View  06/28/2015  CLINICAL DATA:  Follow-up pneumonia. EXAM: CHEST 1 VIEW COMPARISON:  Chest radiograph 11/02/2014 FINDINGS: Stable cardiac and mediastinal contours. Large hiatal hernia. Significant interval improvement in previously described airspace opacities with minimal residual heterogeneous opacity left lung base. No pleural effusion or pneumothorax. There appears to be interval fracture of the right Harrington rod. IMPRESSION: Near complete resolution of previously described bilateral airspace opacities. Minimal residual atelectasis left lung base. Large hiatal hernia. Interval fracture of the mid aspect of the right Harrington rod. Electronically Signed   By: Lovey Newcomer M.D.   On: 06/28/2015 16:42   Dg Chest 2 View  07/22/2015  CLINICAL DATA:  Fever and hypoxia EXAM: CHEST - 2 VIEW COMPARISON:  06/28/2015 FINDINGS: Cardiac shadow is stable. A hiatal hernia is again seen. Fixation hardware is again noted within the thoracolumbar spine. The previously noted fracture of the right Harrington rod is again visualized. No acute bony abnormality is seen. No focal  infiltrate is noted. IMPRESSION: No acute abnormality seen. Electronically Signed   By: Inez Catalina M.D.   On: 07/22/2015 13:27     EKG: (Independently reviewed)  Sinus tachycardia with ventricular rate 130 bpm, QTC 469 ms, voltage criteria met for LVH and otherwise EKG unchanged from previous scan June 2016   Assessment & Plan  Principal Problem:   Sepsis due to urinary tract infection/metabolic encephalopathy  -Presented with sepsis physiology as follows: Altered mental status, tachycardia with pulse greater than 131, tachypnea with respiratory rate 32, rectal  temperature 102.6, hypoxemia, lactic acid greater than 2, white count greater than 14,000 -SDU/Inpt -Most likely etiology is urinary tract infection but cannot rule out underlying pulmonary infection so will utilize broad-spectrum antibiotic coverage -Initiate sepsis order set: Pharmacy consultation for vancomycin and Zosyn, blood and urine cultures, PCT, cortisol -Has received 1 L of fluids with decreasing lactic acid to normal therefore will begin maintenance infusion at 150 mL per hour -Currently hemodynamically stable with systolic blood pressure greater than 90 and map > 65  Active Problems:   CKD, stage III -Renal function appears at baseline -Appears to be dehydrated based on presentation and hypercalcemia -Volume resuscitation as above -Renally adjusted Lovenox and other medications ACE upon GFR -Follow labs    Acute respiratory failure with hypoxia  -No focal infiltrate on chest x-ray but patient presenting with dehydration therefore will repeat chest x-ray in a.m. once likely euvolemic -Resident of skilled nursing facility so at risk for HCAP (begin empiric antibiotics) as well as other viral upper respiratory pathology (ck influenza panel and respiratory viral panel) -Continue supportive care -Since quite lethargic (possibly from preadmission Phenergan) Will check ABG to rule out hypercarbia    RA  -History of prior adrenal insufficiency noting patient on chronic prednisone 10 mg daily -Currently hemodynamically stable but follow-up on cortisol level -If patient still lethargic and unsafe to tolerate orals in the a.m. convert to IV dosing for steroid    Hypothyroidism -Check TSH-was 0.81 December 2016 -Convert to IV Synthroid 25 g daily    Essential hypertension -In setting of sepsis will hold all antihypertensive medications and any diuretics  Pulmonary hypertension -See essential hypertension    Coronary artery disease -EKG unremarkable and apparently no typical  ischemic symptomatology recently -On statin, baby aspirin and beta blocker prior to admission    Obstructive hypertrophic cardiomyopathy/Chronic diastolic CHF (congestive heart failure)  -Heart failure appears compensated and some septic and volume depleted but focus on volume resuscitation initially -Meds on hold as above -Last echocardiogram was in May 2016 and revealed grade 1 diastolic heart failure with moderate pulmonary hypertension normal tricuspid valve normal RV and LV systolic function    History of CVA/Dysphagia -Uncertain if patient was on dysphagia type diet prior to admission -Once more alert and back to baseline may need to perform formal swallowing evaluation before initiate diet    HLD -On Crestor prior to admission as well as omega-3 fatty acids    Anemia, iron deficiency -Hemoglobin stable and at baseline    Anxiety disorder/insomnia -Per medication reconciliation sheet patient took Ativan 0.5 mg every night at bedtime along with Robaxin. Prior to admission she also was taking Zoloft    DVT Prophylaxis: Lovenox  Family Communication:   Children at bedside  Code Status:  Full code  Condition:  Guarded  Discharge disposition: When medically stable anticipate return to skilled nursing facility  Time spent in minutes : 60      ELLIS,ALLISON L. ANP  on 07/22/2015 at 3:59 PM  You may contact me by going to [email or web address] - password [email or web address]  I am available from 7a-7p but please confirm I am on the schedule by going to Amion as above.   After 7p please contact night coverage person covering me after hours  [place] during the described time interval was provided by me . I have reviewed this patient's available data, including medical history, events of note, physical examination, and all test results as part of my evaluation. I have personally reviewed and interpreted all radiology studies. I have discussed the A&P with NP ELLIS,ALLISON and  agree with above plan.

## 2015-07-22 NOTE — ED Notes (Signed)
Attempted report 

## 2015-07-22 NOTE — ED Notes (Signed)
Patient transported to X-ray 

## 2015-07-22 NOTE — Progress Notes (Signed)
Pharmacy Code Sepsis Protocol  Time of code sepsis page: [id number]  [x]  Antibiotics administered prior to code at 1132 (if checked, omit next 2 questions)  Were antibiotics ordered at the time of the code sepsis page? No   RN already pulled abx  Pharmacy consulted for: Zosyn and vancomycin  Anti-infectives    Start     Dose/Rate Route Frequency Ordered Stop   07/22/15 1145  piperacillin-tazobactam (ZOSYN) IVPB 3.375 g     3.375 g 100 mL/hr over 30 Minutes Intravenous  Once 07/22/15 1132 07/22/15 1215   07/22/15 1145  vancomycin (VANCOCIN) IVPB 1000 mg/200 mL premix     1,000 mg 200 mL/hr over 60 Minutes Intravenous  Once 07/22/15 1132          Nurse education provided: [x]  Minutes left to administer antibiotics to achieve 1 hour goal [x]  Correct order of antibiotic administration [x]  Antibiotic Y-site compatibilities    Elenor Quinones, PharmD, BCPS Clinical Pharmacist Pager [phone] 07/22/2015 12:17 PM

## 2015-07-23 ENCOUNTER — Inpatient Hospital Stay (HOSPITAL_COMMUNITY): Payer: Medicare Other

## 2015-07-23 DIAGNOSIS — E785 Hyperlipidemia, unspecified: Secondary | ICD-10-CM

## 2015-07-23 DIAGNOSIS — N183 Chronic kidney disease, stage 3 (moderate): Secondary | ICD-10-CM

## 2015-07-23 DIAGNOSIS — A4151 Sepsis due to Escherichia coli [E. coli]: Principal | ICD-10-CM

## 2015-07-23 DIAGNOSIS — R1310 Dysphagia, unspecified: Secondary | ICD-10-CM

## 2015-07-23 DIAGNOSIS — I2510 Atherosclerotic heart disease of native coronary artery without angina pectoris: Secondary | ICD-10-CM

## 2015-07-23 DIAGNOSIS — D509 Iron deficiency anemia, unspecified: Secondary | ICD-10-CM

## 2015-07-23 DIAGNOSIS — Z8673 Personal history of transient ischemic attack (TIA), and cerebral infarction without residual deficits: Secondary | ICD-10-CM

## 2015-07-23 DIAGNOSIS — G9341 Metabolic encephalopathy: Secondary | ICD-10-CM

## 2015-07-23 LAB — COMPREHENSIVE METABOLIC PANEL
ALBUMIN: 2.1 g/dL — AB (ref 3.5–5.0)
ALK PHOS: 46 U/L (ref 38–126)
ALT: 13 U/L — AB (ref 14–54)
AST: 29 U/L (ref 15–41)
Anion gap: 9 (ref 5–15)
BILIRUBIN TOTAL: 0.8 mg/dL (ref 0.3–1.2)
BUN: 22 mg/dL — AB (ref 6–20)
CO2: 24 mmol/L (ref 22–32)
CREATININE: 1.61 mg/dL — AB (ref 0.44–1.00)
Calcium: 8.8 mg/dL — ABNORMAL LOW (ref 8.9–10.3)
Chloride: 112 mmol/L — ABNORMAL HIGH (ref 101–111)
GFR calc Af Amer: 33 mL/min — ABNORMAL LOW (ref >60)
GFR calc non Af Amer: 28 mL/min — ABNORMAL LOW (ref >60)
GLUCOSE: 87 mg/dL (ref 65–99)
POTASSIUM: 3.8 mmol/L (ref 3.5–5.1)
Sodium: 145 mmol/L (ref 135–145)
TOTAL PROTEIN: 4.9 g/dL — AB (ref 6.5–8.1)

## 2015-07-23 LAB — CBC
HEMATOCRIT: 30.9 % — AB (ref 36.0–46.0)
Hemoglobin: 9.9 g/dL — ABNORMAL LOW (ref 12.0–15.0)
MCH: 28.9 pg (ref 26.0–34.0)
MCHC: 32 g/dL (ref 30.0–36.0)
MCV: 90.4 fL (ref 78.0–100.0)
PLATELETS: 94 10*3/uL — AB (ref 150–400)
RBC: 3.42 MIL/uL — ABNORMAL LOW (ref 3.87–5.11)
RDW: 14.9 % (ref 11.5–15.5)
WBC: 20.7 10*3/uL — ABNORMAL HIGH (ref 4.0–10.5)

## 2015-07-23 MED ORDER — HYDROCORTISONE NA SUCCINATE PF 100 MG IJ SOLR
50.0000 mg | Freq: Three times a day (TID) | INTRAMUSCULAR | Status: DC
  Administered 2015-07-23: 50 mg via INTRAVENOUS
  Filled 2015-07-23: qty 2

## 2015-07-23 MED ORDER — SACCHAROMYCES BOULARDII 250 MG PO CAPS
250.0000 mg | ORAL_CAPSULE | Freq: Two times a day (BID) | ORAL | Status: DC
  Administered 2015-07-23 – 2015-07-26 (×6): 250 mg via ORAL
  Filled 2015-07-23 (×7): qty 1

## 2015-07-23 MED ORDER — FERROUS SULFATE 325 (65 FE) MG PO TABS
325.0000 mg | ORAL_TABLET | Freq: Three times a day (TID) | ORAL | Status: DC
  Administered 2015-07-23 – 2015-07-26 (×7): 325 mg via ORAL
  Filled 2015-07-23 (×8): qty 1

## 2015-07-23 MED ORDER — LEVOTHYROXINE SODIUM 50 MCG PO TABS
50.0000 ug | ORAL_TABLET | Freq: Every day | ORAL | Status: DC
  Administered 2015-07-24 – 2015-07-26 (×3): 50 ug via ORAL
  Filled 2015-07-23 (×3): qty 1

## 2015-07-23 MED ORDER — MONTELUKAST SODIUM 10 MG PO TABS
10.0000 mg | ORAL_TABLET | Freq: Every day | ORAL | Status: DC
  Administered 2015-07-23 – 2015-07-25 (×3): 10 mg via ORAL
  Filled 2015-07-23 (×4): qty 1

## 2015-07-23 MED ORDER — HYDRALAZINE HCL 20 MG/ML IJ SOLN
10.0000 mg | INTRAMUSCULAR | Status: DC | PRN
  Administered 2015-07-24 – 2015-07-25 (×3): 10 mg via INTRAVENOUS
  Filled 2015-07-23 (×3): qty 1

## 2015-07-23 MED ORDER — METOPROLOL TARTRATE 25 MG PO TABS
25.0000 mg | ORAL_TABLET | Freq: Two times a day (BID) | ORAL | Status: DC
  Administered 2015-07-23 – 2015-07-26 (×6): 25 mg via ORAL
  Filled 2015-07-23 (×7): qty 1

## 2015-07-23 MED ORDER — SERTRALINE HCL 50 MG PO TABS
50.0000 mg | ORAL_TABLET | Freq: Every morning | ORAL | Status: DC
  Administered 2015-07-23 – 2015-07-26 (×3): 50 mg via ORAL
  Filled 2015-07-23 (×4): qty 1

## 2015-07-23 MED ORDER — FAMOTIDINE IN NACL 20-0.9 MG/50ML-% IV SOLN
20.0000 mg | Freq: Two times a day (BID) | INTRAVENOUS | Status: DC
  Administered 2015-07-23: 20 mg via INTRAVENOUS
  Filled 2015-07-23: qty 50

## 2015-07-23 MED ORDER — METHOCARBAMOL 500 MG PO TABS
500.0000 mg | ORAL_TABLET | Freq: Every evening | ORAL | Status: DC | PRN
  Administered 2015-07-23 – 2015-07-26 (×2): 500 mg via ORAL
  Filled 2015-07-23 (×2): qty 1

## 2015-07-23 MED ORDER — PREDNISONE 10 MG PO TABS
10.0000 mg | ORAL_TABLET | Freq: Every day | ORAL | Status: DC
  Administered 2015-07-23 – 2015-07-26 (×3): 10 mg via ORAL
  Filled 2015-07-23 (×4): qty 1

## 2015-07-23 MED ORDER — PANTOPRAZOLE SODIUM 40 MG PO TBEC
40.0000 mg | DELAYED_RELEASE_TABLET | Freq: Every day | ORAL | Status: DC
  Administered 2015-07-23 – 2015-07-26 (×3): 40 mg via ORAL
  Filled 2015-07-23 (×4): qty 1

## 2015-07-23 MED ORDER — LIDOCAINE 5 % EX PTCH
1.0000 | MEDICATED_PATCH | CUTANEOUS | Status: DC
Start: 2015-07-23 — End: 2015-07-23

## 2015-07-23 MED ORDER — ROSUVASTATIN CALCIUM 20 MG PO TABS
20.0000 mg | ORAL_TABLET | Freq: Every evening | ORAL | Status: DC
  Administered 2015-07-23 – 2015-07-25 (×3): 20 mg via ORAL
  Filled 2015-07-23: qty 1
  Filled 2015-07-23: qty 2
  Filled 2015-07-23: qty 1

## 2015-07-23 NOTE — Progress Notes (Signed)
[facility] Progress Note  Patient: [name] [id number]   PCP: [name], MD DOB: Feb 01, 1930   DOA: 07/22/2015   DOS: 07/23/2015   Date of Service: the patient was seen and examined on 07/23/2015  Subjective: Patient denied any acute complaint. No abdominal pain or nausea or vomiting. No diarrhea no constipation. Family things of the mentation is getting better. No cough. Nutrition: Was nothing by mouth last night Activity: Bedridden at present Last BM: 07/23/2015  Assessment and Plan: 1. Escherichia coli sepsis [facility]) Patient was also complains of confusion and lethargy. She was also hypoxic tachycardic had fever. Meeting the criteria for sepsis Blood culture is growing gram-negative rods, urine is growing E Shasha coli. Sensitivities pending. Continuing Zosyn discontinue vancomycin. MRSA negative. Influenza PCR is negative although the respiratory panel has been pending and therefore continuing droplet precaution. Rechecking the blood culture to insure resolution. Pro-calcitonin 38.82  2. Dysphagia. Patient was having difficulty swallowing in the ER. Has dysphagia at her baseline. Appreciate input from speech therapy who recommends dysphagia type II diet. Switching patient's medication to oral.  3.  GERD. Continuing PPI.  4. Hypothyroidism. Continuing Synthroid.  5. Chronic adrenal insufficiency. Blood pressure stable to resume prednisone home dose.  6. Essential hypertension. Continuing Lopressor 25 mg twice a day.  7. Chronic diastolic dysfunction. Discontinuing fluid at present. Appears euvolemic at present.  8. Iron deficiency anemia. Continuing supplements. Stable.  9. Thrombocytopenia. Most likely in the setting of infection as well as dilution due to fluids. Discontinue DVT prophylaxis pharmacological and place her on mechanical prophylaxis.  DVT Prophylaxis: mechanical compression device. Nutrition: Dysphagia type II cardiac diet Advance goals  of care discussion: Full code  Brief Summary of Hospitalization:  HPI: As per the H and P dictated on admission, "This is an 61 female patient resident of [name] skilled nursing facility since May 2016 who presented to the hospital report of fever. Patient has a past medical history of old CVA and resultant dysphagia, CAD, hypothyroidism, rheumatoid arthritis on chronic steroids, hypertension and associated HOCM with chronic diastolic heart failure, dyslipidemia, anemia and chronic kidney disease. Per nursing home records patient developed nausea and vomiting without abdominal pain or diarrhea on 1/10; this was associated with fever. Urinalysis had been obtained and was negative for UTI. Follow-up evaluation on 1/12 and a straight resolution of vomiting the patient was having coughing with wheezing and concerns were for possible aspiration so a chest x-ray was obtained that did not reveal evidence of pneumonia or other focal infiltrate. According to family at bedside and patient apparently had some improvement although for the past 1 week she has really not eaten much. The past 2 days patient has developed progressive confusion and recurrence of her nausea and vomiting and much higher fevers now up to 102.2 at the facility. During initial episodic nausea and again have been tried but was too sedating therefore patient was transitioned and Zofran but remained nauseated. Of note recently patient has had change in urine to being more malodorous and dark"  Daily update, Procedures: Blood cultures positive for gram-negative rods 07/23/2015, urine positive for Escherichia coli. Consultants: None Antibiotics: Anti-infectives    Start     Dose/Rate Route Frequency Ordered Stop   07/23/15 1200  vancomycin (VANCOCIN) IVPB 750 mg/150 ml premix  Status:  Discontinued     750 mg 150 mL/hr over 60 Minutes Intravenous Every 24 hours 07/22/15 1303 07/23/15 1127   07/22/15 1800  piperacillin-tazobactam (ZOSYN)  IVPB 3.375 g  3.375 g 12.5 mL/hr over 240 Minutes Intravenous Every 8 hours 07/22/15 1303     07/22/15 1145  piperacillin-tazobactam (ZOSYN) IVPB 3.375 g     3.375 g 100 mL/hr over 30 Minutes Intravenous  Once 07/22/15 1132 07/22/15 1215   07/22/15 1145  vancomycin (VANCOCIN) IVPB 1000 mg/200 mL premix     1,000 mg 200 mL/hr over 60 Minutes Intravenous  Once 07/22/15 1132 07/22/15 1300      Family Communication: family was present at bedside, at the time of interview.  Opportunity was given to ask question and all questions were answered satisfactorily.   Disposition:  Barriers to safe discharge: Improvement and sepsis picture   Intake/Output Summary (Last 24 hours) at 07/23/15 1625 Last data filed at 07/23/15 0924  Gross per 24 hour  Intake 2660.5 ml  Output      0 ml  Net 2660.5 ml   Filed Weights   07/22/15 1100 07/22/15 1712 07/23/15 0232  Weight: 65.772 kg (145 lb) 67 kg (147 lb 11.3 oz) 68.4 kg (150 lb 12.7 oz)    Objective: Physical Exam: Filed Vitals:   07/23/15 0400 07/23/15 0600 07/23/15 1442 07/23/15 1610  BP: 170/88 176/84 142/89 156/83  Pulse: 97 97 89 81  Temp: 99.8 F (37.7 C) 99.5 F (37.5 C)  98.9 F (37.2 C)  TempSrc: Oral Oral  Oral  Resp: 22 26  25   Height:      Weight:      SpO2: 99% 98%  98%    General: Appear in mild distress, no Rash; Oral Mucosa moist. Cardiovascular: S1 and S2 Present, no Murmur, no JVD Respiratory: Bilateral Air entry present and Clear to Auscultation, no Crackles, no wheezes Abdomen: Bowel Sound present, Soft and no tenderness Extremities: no Pedal edema, no calf tenderness Neurology: Grossly no focal neuro deficit.  Data Reviewed: CBC:  Recent Labs Lab 07/22/15 1123 07/23/15 0226  WBC 27.8* 20.7*  NEUTROABS 24.0*  --   HGB 13.0 9.9*  HCT 39.7 30.9*  MCV 88.6 90.4  PLT 116* 94*   Basic Metabolic Panel:  Recent Labs Lab 07/22/15 1123 07/23/15 0226  NA 144 145  K 3.5 3.8  CL 101 112*  CO2 26 24    GLUCOSE 147* 87  BUN 25* 22*  CREATININE 1.78* 1.61*  CALCIUM 11.3* 8.8*   Liver Function Tests:  Recent Labs Lab 07/22/15 1123 07/23/15 0226  AST 23 29  ALT 15 13*  ALKPHOS 66 46  BILITOT 1.1 0.8  PROT 6.3* 4.9*  ALBUMIN 2.9* 2.1*   No results for input(s): LIPASE, AMYLASE in the last 168 hours. No results for input(s): AMMONIA in the last 168 hours.  Cardiac Enzymes: No results for input(s): CKTOTAL, CKMB, CKMBINDEX, TROPONINI in the last 168 hours.  BNP (last 3 results)  Recent Labs  10/29/14 1027  BNP 1045.3*    CBG: No results for input(s): GLUCAP in the last 168 hours.  Recent Results (from the past 240 hour(s))  Urine culture     Status: None (Preliminary result)   Collection Time: 07/22/15 11:10 AM  Result Value Ref Range Status   Specimen Description URINE, CATHETERIZED  Final   Special Requests NONE  Final   Culture >=100,000 COLONIES/mL ESCHERICHIA COLI  Final   Report Status PENDING  Incomplete  Culture, blood (routine x 2)     Status: None (Preliminary result)   Collection Time: 07/22/15 11:23 AM  Result Value Ref Range Status   Specimen Description RIGHT ANTECUBITAL  Final   Special Requests BOTTLES DRAWN AEROBIC AND ANAEROBIC 5ML  Final   Culture  Setup Time   Final    GRAM NEGATIVE RODS IN BOTH AEROBIC AND ANAEROBIC BOTTLES CRITICAL RESULT CALLED TO, READ BACK BY AND VERIFIED WITH: Dolan Amen RN [id number] GREEN R CONFIRMED BY Bulger    Culture NO GROWTH 1 DAY  Final   Report Status PENDING  Incomplete  MRSA PCR Screening     Status: None   Collection Time: 07/22/15  6:22 PM  Result Value Ref Range Status   MRSA by PCR NEGATIVE NEGATIVE Final    Comment:        The GeneXpert MRSA Assay (FDA approved for NASAL specimens only), is one component of a comprehensive MRSA colonization surveillance program. It is not intended to diagnose MRSA infection nor to guide or monitor treatment for MRSA infections.   Culture, blood  (routine x 2)     Status: None (Preliminary result)   Collection Time: 07/22/15  7:05 PM  Result Value Ref Range Status   Specimen Description BLOOD LEFT ANTECUBITAL  Final   Special Requests IN PEDIATRIC BOTTLE 1CC  Final   Culture  Setup Time   Final    GRAM NEGATIVE RODS PEDIATRIC BOTTLE CRITICAL RESULT CALLED TO, READ BACK BY AND VERIFIED WITH: T. HOOD,RN AT [id number] ON [id number] BY S. YARBROUGH    Culture NO GROWTH < 24 HOURS  Final   Report Status PENDING  Incomplete     Studies: Dg Chest Port 1 View  07/23/2015  CLINICAL DATA:  Sepsis EXAM: PORTABLE CHEST 1 VIEW COMPARISON:  July 22, 2015 FINDINGS: There is slight left base atelectasis. Lungs elsewhere clear. Heart is upper normal in size with pulmonary vascularity within normal limits. Hiatal hernia present. Rod fixation in the thoracic and lumbar regions is stable. There is atherosclerotic calcification in the aorta. There is evidence of old trauma involving the proximal right humerus. IMPRESSION: Slight left base atelectasis. No edema or consolidation. No change in cardiac silhouette. Hiatal hernia present. Electronically Signed   By: Lowella Grip III M.D.   On: 07/23/2015 07:04     Scheduled Meds: . ferrous sulfate  325 mg Oral TID WC  . [START ON 07/24/2015] levothyroxine  50 mcg Oral QAC breakfast  . metoprolol tartrate  25 mg Oral BID  . montelukast  10 mg Oral QHS  . pantoprazole  40 mg Oral Daily  . piperacillin-tazobactam (ZOSYN)  IV  3.375 g Intravenous Q8H  . predniSONE  10 mg Oral Daily  . rosuvastatin  20 mg Oral QPM  . saccharomyces boulardii  250 mg Oral BID  . sertraline  50 mg Oral q morning - 10a  . sodium chloride  3 mL Intravenous Q12H   Continuous Infusions:  PRN Meds: acetaminophen **OR** acetaminophen, hydrALAZINE, ipratropium-albuterol, methocarbamol, ondansetron (ZOFRAN) IV  Time spent: 30 minutes  Author: Berle Mull, MD [facility] Pager: [phone] 07/23/2015 4:25 PM  If 7PM-7AM,  please contact night-coverage at [email or web address], password [facility]

## 2015-07-23 NOTE — Progress Notes (Signed)
CRITICAL VALUE ALERT  Critical value received:  +blood cultures  Date of notification:  07/23/15  Time of notification:  0230  Critical value read back:Yes.    Nurse who received alert:  Nani Ravens RN  MD notified (1st page):  Palmer NP  Time of first page:  0330  MD notified (2nd page):  Time of second page:  Responding MD:    Time MD responded:

## 2015-07-23 NOTE — Consult Note (Signed)
Clinical/Bedside Swallow Evaluation Patient Details  Name: [name] MRN: [id number] Date of Birth: 02/19/1930  Today's Date: 07/23/2015 Time: SLP Start Time (ACUTE ONLY): 1123 SLP Stop Time (ACUTE ONLY): 1140 SLP Time Calculation (min) (ACUTE ONLY): 17 min  Past Medical History:  Past Medical History  Diagnosis Date  . Arthritis   . Right ear pain   . Renal insufficiency   . RA (rheumatoid arthritis) ([place])   . PUD (peptic ulcer disease)   . GI bleed 2005  . GERD (gastroesophageal reflux disease)   . Hiatal hernia   . Hypothyroidism   . Osteoporosis   . Depression   . Anxiety   . Coronary artery disease     a. 2012 s/p NSTEMI/Cath: 95% apical LAD dzs, otw nonobs dzs-->Med Rx;  b. 05/2011 MV: no ischemia, EF 79%, inf infarct- attenuation.  . CVA (cerebral vascular accident) ([place])   . TIA (transient ischemic attack)   . Fall   . DVT of lower extremity (deep venous thrombosis) ([place])   . HCAP (healthcare-associated pneumonia) 01/22/2013  . Enteritis due to Clostridium difficile 01/04/2013  . Sepsis ([place]) 12/19/2012  . Recurrent colitis due to Clostridium difficile 06/02/2012  . Essential hypertension   . Hyperlipidemia   . Fatigue   . Weight loss   . Hemorrhoids   . Malnutrition ([place])   . Dyspnea   . Elevated troponin     a. 10/2014 in setting of sepsis/?HCAP.  Marland Kitchen Chronic diastolic CHF (congestive heart failure) ([place])     a. 10/2014 Echo: EF 60-65%, mild LVH, grade 1 DD, dynamic obstruction @ rest - peak velocity of 271 cm/sec, peak grad of 73mmHg, PASP 80mmHg.  Marland Kitchen Obstructive hypertrophic cardiomyopathy ([place])     a. 10/2014 Echo: EF 60-65%, mild LVH, grade 1 DD, dynamic obstruction @ rest - peak velocity of 271 cm/sec, peak grad of 69mmHg, PASP 53mmHg.   Past Surgical History:  Past Surgical History  Procedure Laterality Date  . Nstemi  06/2010  . Spinal fusion surgery    . Knee surgery    . Cardiac catheterization      SHOWED RUPTURE PLAQUE IN THE LAD. THE LAD IS  NONOBSTRUCTIVE WITH ONLY 30-40% STENOSIS  . Back surgery    . Breast surgery  1964    x3  . Ankle surgery    . Colonoscopy     HPI:  This is an 27 female patient resident of [name] skilled nursing facility since May 2016 who presented to the hospital with report of fever. Patient has a past medical history of old CVA and resultant dysphagia, CAD, hypothyroidism, rheumatoid arthritis on chronic steroids, hypertension and associated HOCM with chronic diastolic heart failure, dyslipidemia, anemia and chronic kidney disease. Per nursing home records patient developed nausea and vomiting without abdominal pain or diarrhea on 1/10; this was associated with fever. Urinalysis had been obtained and was negative for UTI. Follow-up evaluation on 1/12 and a straight resolution of vomiting the patient was having coughing with wheezing and concerns were for possible aspiration so a chest x-ray was obtained that did not reveal evidence of pneumonia or other focal infiltrate. According to family at bedside and patient apparently had some improvement although for the past 1 week she has really not eaten much. The past 2 days patient has developed progressive confusion and recurrence of her nausea and vomiting and much higher fevers now up to 102.2 at the facility.  Of note, the patient has had extensive swallow work up over  the past 2-3 years.  Last MBS was dated 04/18/2013 with recommendation for a Regular diet and thin liquids.  Most recent ST contact was for clinical swallowing evaluation 11/02/2014 with recommendation for a Regular diet and thin liquids.     Assessment / Plan / Recommendation Clinical Impression  Clinical swallowing evaluation was completed.  The patient has had extensive swallow work up over the last 2-3 years.  Most recent MBS was dated 04/18/2013 with recommendation for a regular diet and thin liquids.  Most recent clinical swallowing evaluation was dated 11/02/2014 with recommendation for a  regular diet and thin liquids.  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  Today the patient inconsistently followed directions.  Verbal and visual cues were helpful.  However, oral mechanism exam was limited.  Overt issues were not noted.  Clinically the patient presented with oropharyngeal dysphagia characterized by delayed oral transit and delayed swallow trigger.  Hyo-larungeal excursion was judged to be adequate.  Overt s/s of aspiration were not observed.  Recommend begin a dysphagia 2 diet with thin liquids.  ST to follow closely for therapeutic diet tolerance.  Nursing asked to please notify ST if the patient is noted to have any issues with her meal trays.      Aspiration Risk  Mild aspiration risk    Diet Recommendation   Dysphagia 2 with thin liquids.  NO STRAWS.  Medication Administration: Crushed with puree    Other  Recommendations Oral Care Recommendations: Oral care BID   Follow up Recommendations   (To be determined.  )    Frequency and Duration min 2x/week  2 weeks       Prognosis Prognosis for Safe Diet Advancement: Fair Barriers to Reach Goals: Cognitive deficits      Swallow Study   General Date of Onset: 07/22/15 HPI: This is an 67 female patient resident of [facility] skilled nursing facility since May 2016 who presented to the hospital with report of fever. Patient has a past medical history of old CVA and resultant dysphagia, CAD, hypothyroidism, rheumatoid arthritis on chronic steroids, hypertension and associated HOCM with chronic diastolic heart failure, dyslipidemia, anemia and chronic kidney disease. Per nursing home records patient developed nausea and vomiting without abdominal pain or diarrhea on 1/10; this was associated with fever. Urinalysis had been obtained and was  negative for UTI. Follow-up evaluation on 1/12 and a straight resolution of vomiting the patient was having coughing with wheezing and concerns were for possible aspiration so a chest x-ray was obtained that did not reveal evidence of pneumonia or other focal infiltrate. According to family at bedside and patient apparently had some improvement although for the past 1 week she has really not eaten much. The past 2 days patient has developed progressive confusion and recurrence of her nausea and vomiting and much higher fevers now up to 102.2 at the facility.  Of note, the patient has had extensive swallow work up over the past 2-3 years.  Last MBS was dated 04/18/2013 with recommendation for a Regular diet and thin liquids.  Most recent ST contact was for clinical swallowing evaluation 11/02/2014 with recommendation for a Regular diet and thin liquids.   Type of Study: Bedside Swallow Evaluation Previous Swallow Assessment: 04/18/2013 MBS Rx Reg/thin  11/02/2014 BSE Rx Reg/thin Diet Prior to this Study: NPO Temperature Spikes Noted: Yes Respiratory Status:  Nasal cannula History of Recent Intubation: No Behavior/Cognition: Alert;Cooperative;Pleasant mood;Requires cueing Oral Cavity Assessment: Within Functional Limits Oral Care Completed by SLP: No Oral Cavity - Dentition: Poor condition;Missing dentition Vision: Functional for self-feeding Self-Feeding Abilities: Needs assist Patient Positioning: Upright in bed Baseline Vocal Quality: Normal Volitional Cough: Strong Volitional Swallow: Able to elicit    Oral/Motor/Sensory Function     Ice Chips Ice chips: Not tested   Thin Liquid Thin Liquid: Impaired Presentation: Cup;Spoon;Self Fed Pharyngeal  Phase Impairments: Suspected delayed Swallow    Nectar Thick Nectar Thick Liquid: Not tested   Honey Thick Honey Thick Liquid: Not tested   Puree Puree: Impaired Presentation: Spoon Oral Phase Impairments: Impaired mastication Oral Phase  Functional Implications: Prolonged oral transit Pharyngeal Phase Impairments: Suspected delayed Swallow   Solid   GO   Solid: Impaired (dual textures) Presentation: Spoon Oral Phase Impairments: Impaired mastication Oral Phase Functional Implications: Prolonged oral transit Pharyngeal Phase Impairments: Suspected delayed Swallow    Functional Assessment Tool Used: ASHA NOMS and clinical judgment.   Functional Limitations: Swallowing Swallow Current Status [phone]): At least 20 percent but less than 40 percent impaired, limited or restricted Swallow Goal Status [phone]): At least 20 percent but less than 40 percent impaired, limited or restricted  Shelly Flatten, [place], [place] [phone] Shelly Flatten N 07/23/2015,11:53 AM

## 2015-07-23 NOTE — Progress Notes (Signed)
CRITICAL VALUE ALERT  Critical value received:  Blood Culture: Pediatric Aerobic Bottle: Positive for Gram Negative Rods  Date of notification:  07/23/2015  Time of notification:  0950   Critical value read back:Yes.    Nurse who received alert:  Latricia Heft, RN  MD notified (1st page): Dr. Posey Pronto  Time of first page:  32  MD notified (2nd page):  Time of second page:  Responding MD:  Dr. Posey Pronto  Time MD responded:  1000

## 2015-07-23 NOTE — Progress Notes (Signed)
Utilization Review Completed.  

## 2015-07-24 LAB — CBC
HEMATOCRIT: 31 % — AB (ref 36.0–46.0)
HEMOGLOBIN: 10 g/dL — AB (ref 12.0–15.0)
MCH: 28.7 pg (ref 26.0–34.0)
MCHC: 32.3 g/dL (ref 30.0–36.0)
MCV: 88.8 fL (ref 78.0–100.0)
Platelets: 204 10*3/uL (ref 150–400)
RBC: 3.49 MIL/uL — AB (ref 3.87–5.11)
RDW: 14.9 % (ref 11.5–15.5)
WBC: 18.1 10*3/uL — ABNORMAL HIGH (ref 4.0–10.5)

## 2015-07-24 LAB — BASIC METABOLIC PANEL
Anion gap: 15 (ref 5–15)
BUN: 20 mg/dL (ref 6–20)
CHLORIDE: 107 mmol/L (ref 101–111)
CO2: 21 mmol/L — AB (ref 22–32)
CREATININE: 1.54 mg/dL — AB (ref 0.44–1.00)
Calcium: 8.5 mg/dL — ABNORMAL LOW (ref 8.9–10.3)
GFR calc non Af Amer: 30 mL/min — ABNORMAL LOW (ref >60)
GFR, EST AFRICAN AMERICAN: 35 mL/min — AB (ref >60)
Glucose, Bld: 132 mg/dL — ABNORMAL HIGH (ref 65–99)
POTASSIUM: 3 mmol/L — AB (ref 3.5–5.1)
SODIUM: 143 mmol/L (ref 135–145)

## 2015-07-24 LAB — URINE CULTURE: Culture: >=100000

## 2015-07-24 MED ORDER — ONDANSETRON HCL 4 MG/2ML IJ SOLN
4.0000 mg | Freq: Once | INTRAMUSCULAR | Status: AC
  Administered 2015-07-24: 4 mg via INTRAVENOUS
  Filled 2015-07-24: qty 2

## 2015-07-24 MED ORDER — CEFAZOLIN SODIUM-DEXTROSE 2-3 GM-% IV SOLR
2.0000 g | Freq: Two times a day (BID) | INTRAVENOUS | Status: DC
  Administered 2015-07-24 – 2015-07-26 (×4): 2 g via INTRAVENOUS
  Filled 2015-07-24 (×6): qty 50

## 2015-07-24 MED ORDER — HYDRALAZINE HCL 25 MG PO TABS
25.0000 mg | ORAL_TABLET | Freq: Three times a day (TID) | ORAL | Status: DC
  Administered 2015-07-24 – 2015-07-25 (×3): 25 mg via ORAL
  Filled 2015-07-24 (×3): qty 1

## 2015-07-24 MED ORDER — POTASSIUM CHLORIDE CRYS ER 20 MEQ PO TBCR
40.0000 meq | EXTENDED_RELEASE_TABLET | Freq: Once | ORAL | Status: AC
  Administered 2015-07-24: 40 meq via ORAL
  Filled 2015-07-24: qty 2

## 2015-07-24 NOTE — Progress Notes (Signed)
Report given to 5W09. Patient transferred via bed with belongings, including clothing. CCMD notified of move, patient will be on telemetry on 5W. Patient concerns regarding facial flushing and nausea passed on to patient's new nurse.  Milford Cage, RN

## 2015-07-24 NOTE — Evaluation (Signed)
Physical Therapy Evaluation Patient Details Name: [name] MRN: [id number] DOB: 28-Jun-1930 Today's Date: 07/24/2015   History of Present Illness  This is an 79 female patient resident of [name] nursing facility since May 2016 who presented to the hospital report of fever. Patient has a past medical history of old CVA and resultant dysphagia, CAD, hypothyroidism, rheumatoid arthritis on chronic steroids, hypertension and associated HOCM with chronic diastolic heart failure, dyslipidemia, anemia and chronic kidney disease. Per nursing home records patient developed nausea and vomiting without abdominal pain or diarrhea on 1/10; this was associated with fever. Urinalysis had been obtained and was negative for UTI. Follow-up evaluation on 1/12 and a straight resolution of vomiting the patient was having coughing with wheezing and concerns were for possible aspiration so a chest x-ray was obtained that did not reveal evidence of pneumonia or other focal infiltrate. According to family at bedside and patient apparently had some improvement although for the past 1 week she has really not eaten much. The past 2 days patient has developed progressive confusion and recurrence of her nausea and vomiting and much higher fevers now up to 102.2 at the facility. During initial episodic nausea and again have been tried but was too sedating therefore patient was transitioned and Zofran but remained nauseated. Of note recently patient has had change in urine to being more malodorous and dark.  Clinical Impression  Pt admitted with above diagnosis. Pt currently with functional limitations due to the deficits listed below (see PT Problem List). Pt was able to stand and pivot to recliner with mod assist.  Pt probably close to baseline but will still work with pt in hospital to maximize and maintain her function.   Pt will benefit from skilled PT to increase their independence and safety with mobility to allow  discharge to the venue listed below.      Follow Up Recommendations SNF;Supervision/Assistance - 24 hour    Equipment Recommendations  Other (comment) (TBA)    Recommendations for Other Services       Precautions / Restrictions Precautions Precautions: Fall Restrictions Weight Bearing Restrictions: No      Mobility  Bed Mobility Overal bed mobility: Needs Assistance;+2 for physical assistance Bed Mobility: Supine to Sit;Rolling Rolling: Mod assist   Supine to sit: Mod assist;+2 for physical assistance;HOB elevated     General bed mobility comments: Pt rolls with mod assist and cues as she was lying in uirine and BM.  Cleaned pt with total assist and changed linen.  Mod assist for LEs and elevation of trunk for pt to sit to EOB.    Transfers Overall transfer level: Needs assistance Equipment used: 2 person hand held assist Transfers: Sit to/from [facility] Sit to Stand: Mod assist Stand pivot transfers: Mod assist       General transfer comment: Pt was able to stand and step around to recliner from bed taking pivotal steps with PT assisting with anterior lean as pt tends to lean posteriorly.    Ambulation/Gait                Stairs            Wheelchair Mobility    Modified Rankin (Stroke Patients Only)       Balance Overall balance assessment: Needs assistance;History of Falls Sitting-balance support: No upper extremity supported;Feet supported Sitting balance-Leahy Scale: Fair Sitting balance - Comments: Sat EOb 3 min without UE support. Postural control: Posterior lean Standing balance support: Bilateral upper  extremity supported;During functional activity Standing balance-Leahy Scale: Poor Standing balance comment: requires UE support.                              Pertinent Vitals/Pain Pain Assessment: No/denies pain  103-121, 99% on 2LO2, 150/70    Home Living Family/patient expects to be discharged to::  Skilled nursing facility                 Additional Comments: per pt, pt used walker. not participating with therapies.     Prior Function Level of Independence: Needs assistance         Comments: Pt performing stand pivot transfers PTA to wheelchair, toilet, and chair with assist. Sometimes she would push the wheelchair as well.       Hand Dominance   Dominant Hand: Right    Extremity/Trunk Assessment   Upper Extremity Assessment: Defer to OT evaluation           Lower Extremity Assessment: RLE deficits/detail;LLE deficits/detail RLE Deficits / Details: grossly 3-/5 LLE Deficits / Details: grossly 3-/5  Cervical / Trunk Assessment: Kyphotic  Communication   Communication: No difficulties  Cognition Arousal/Alertness: Awake/alert Behavior During Therapy: WFL for tasks assessed/performed Overall Cognitive Status: Within Functional Limits for tasks assessed                      General Comments      Exercises General Exercises - Lower Extremity Ankle Circles/Pumps: AROM;Both;5 reps;Supine Long Arc Quad: AROM;Both;5 reps;Seated      Assessment/Plan    PT Assessment Patient needs continued PT services  PT Diagnosis Generalized weakness   PT Problem List Decreased activity tolerance;Decreased strength;Decreased mobility;Decreased balance;Decreased knowledge of use of DME;Decreased safety awareness;Decreased knowledge of precautions  PT Treatment Interventions DME instruction;Functional mobility training;Therapeutic activities;Therapeutic exercise;Balance training;Patient/family education;Cognitive remediation   PT Goals (Current goals can be found in the Care Plan section) Acute Rehab PT Goals Patient Stated Goal: to get better PT Goal Formulation: With patient Time For Goal Achievement: 08/07/15 Potential to Achieve Goals: Good    Frequency Min 2X/week   Barriers to discharge Decreased caregiver support      Co-evaluation                End of Session Equipment Utilized During Treatment: Gait belt;Oxygen Activity Tolerance: Patient limited by fatigue Patient left: in chair;with call bell/phone within reach;with chair alarm set Nurse Communication: Mobility status;Need for lift equipment         Time: 0932-1002 PT Time Calculation (min) (ACUTE ONLY): 30 min   Charges:   PT Evaluation $PT Eval Low Complexity: 1 Procedure PT Treatments $Self Care/Home Management: 8-22   PT G CodesDenice Paradise 08-06-15, 11:43 AM  [facility] [phone] (pager)

## 2015-07-24 NOTE — Progress Notes (Signed)
[name] [id number] Admission Data: 07/24/2015 1:39PM Attending Provider: Lavina Hamman, MD  [id number], [facility], MD Consults/ Treatment Team:    [name] is a 82 y.o. female patient admitted from ED awake, alert  & orientated  X 3,  Full Code, VSS - Blood pressure 171/73, pulse 76, temperature 98 F (36.7 C), temperature source Oral, resp. rate 20, height 5\' 4"  (1.626 m), weight 69.3 kg (152 lb 12.5 oz), SpO2 98 %., room air, no c/o shortness of breath, no c/o chest pain, no distress noted. Tele #  Placed.    IV site WDL: right forearm and left AC with a transparent dsg that's clean dry and intact.  Allergies:   Allergies  Allergen Reactions  . Biphosphate   . Codeine Swelling  . Morphine And Related Other (See Comments)    unknown  . Percocet [Oxycodone-Acetaminophen]     unknown  . Plavix [Clopidogrel Bisulfate] Other (See Comments)    RASH  . Sulfur Other (See Comments)    GI upset     Past Medical History  Diagnosis Date  . Arthritis   . Right ear pain   . Renal insufficiency   . RA (rheumatoid arthritis) ([place])   . PUD (peptic ulcer disease)   . GI bleed 2005  . GERD (gastroesophageal reflux disease)   . Hiatal hernia   . Hypothyroidism   . Osteoporosis   . Depression   . Anxiety   . Coronary artery disease     a. 2012 s/p NSTEMI/Cath: 95% apical LAD dzs, otw nonobs dzs-->Med Rx;  b. 05/2011 MV: no ischemia, EF 79%, inf infarct- attenuation.  . CVA (cerebral vascular accident) ([place])   . TIA (transient ischemic attack)   . Fall   . DVT of lower extremity (deep venous thrombosis) ([place])   . HCAP (healthcare-associated pneumonia) 01/22/2013  . Enteritis due to Clostridium difficile 01/04/2013  . Sepsis ([place]) 12/19/2012  . Recurrent colitis due to Clostridium difficile 06/02/2012  . Essential hypertension   . Hyperlipidemia   . Fatigue   . Weight loss   . Hemorrhoids   . Malnutrition ([place])   . Dyspnea   . Elevated troponin     a. 10/2014 in setting of  sepsis/?HCAP.  Marland Kitchen Chronic diastolic CHF (congestive heart failure) ([place])     a. 10/2014 Echo: EF 60-65%, mild LVH, grade 1 DD, dynamic obstruction @ rest - peak velocity of 271 cm/sec, peak grad of 57mmHg, PASP 34mmHg.  Marland Kitchen Obstructive hypertrophic cardiomyopathy ([place])     a. 10/2014 Echo: EF 60-65%, mild LVH, grade 1 DD, dynamic obstruction @ rest - peak velocity of 271 cm/sec, peak grad of 37mmHg, PASP 18mmHg.   Pt orientation to unit, room and routine. SR up x 2, fall risk assessment complete with Patient  verbalizing understanding of risks associated with falls. Pt verbalizes an understanding of how to use the call bell and to call for help before getting out of bed.  Skin, clean-dry- intact with scattered bruises noted to BUE and BLE. Right heel is red but blanchable. Redness noted to sacrum, foam dressing in place for prevention.  Skin assessment done with Zenon Mayo RN. Pt resting in bed. Bed low and locked and bed alarm on. Call bell and phone within reach.   Will cont to monitor and assist as needed.  Dorita Fray, RN 07/24/2015 2:45 PM

## 2015-07-24 NOTE — Progress Notes (Signed)
Patient's BP 168/81. Patient has order for PRN Hydralazine q4h for "High blood pressure". Lamar Blinks MD paged and notified for order parameters for PRN Hydralazine.

## 2015-07-24 NOTE — Progress Notes (Signed)
Speech Language Pathology Dysphagia Treatment Patient Details Name: [name] MRN: [id number] DOB: 23-Jan-1930 Today's Date: 07/24/2015 Time: [id number] SLP Time Calculation (min) (ACUTE ONLY): 22 min  Assessment / Plan / Recommendation Clinical Impression    SLP provided skilled observation of POs to advise diet advancement and determine need for instrumental testing. SLP noted improved mental status from yesterday's evaluation per assessing SLP note. No evidence of dysphagia and no s/s of penetration/aspiration noted during intake of thin liquids and solids, so instrumental testing not necessary at this time. Mastication appeared within functional limits and swallow was timely and efficient. Decreased oral control due to increased volume via straw evidenced by immediate inhalation following sip. Pt was educated re: diet recommendation and compensatory strategies. Recommend regular diet with thin liquids, no straws. SLP will f/u to determine diet toleration.    Diet Recommendation    Regular diet with thin liquids, meds whole in puree, no straws   SLP Plan Continue with current plan of care      Swallowing Goals     General Behavior/Cognition: Alert;Cooperative;Pleasant mood;Requires cueing Patient Positioning: Upright in chair/Tumbleform HPI: This is an [name] since May [facility] who presented to the hospital with report of fever. Patient has a past medical history of old CVA and resultant dysphagia, CAD, hypothyroidism, rheumatoid arthritis on chronic steroids, hypertension and associated HOCM with chronic diastolic heart failure, dyslipidemia, anemia and chronic kidney disease. Per nursing home records patient developed nausea and vomiting without abdominal pain or diarrhea on 1/10; this was associated with fever. Urinalysis had been obtained and was negative for UTI. Follow-up evaluation on 1/12 and a straight resolution of vomiting  the patient was having coughing with wheezing and concerns were for possible aspiration so a chest x-ray was obtained that did not reveal evidence of pneumonia or other focal infiltrate. According to family at bedside and patient apparently had some improvement although for the past 1 week she has really not eaten much. The past 2 days patient has developed progressive confusion and recurrence of her nausea and vomiting and much higher fevers now up to 102.2 at the facility.  Of note, the patient has had extensive swallow work up over the past 2-3 years.  Last MBS was dated 04/18/2013 with recommendation for a Regular diet and thin liquids.  Most recent ST contact was for clinical swallowing evaluation 11/02/2014 with recommendation for a Regular diet and thin liquids.    Oral Cavity - Oral Hygiene Does patient have any of the following "at risk" factors?: None of the above Patient is LOW RISK: Follow universal precautions (see row information)   Dysphagia Treatment Treatment Methods: Skilled observation;Upgraded PO texture trial;Differential diagnosis;Compensation strategy training Patient observed directly with PO's: Yes Type of PO's observed: Regular;Thin liquids Feeding: Able to feed self;Needs assist Liquids provided via: Cup;Straw Oral Phase Signs & Symptoms: Other (comment) (none found) Pharyngeal Phase Signs & Symptoms: Other (comment) (none found) Type of cueing: Verbal Amount of cueing: Moderate   GO     [name] 07/24/2015, 10:33 AM   [name], Student-SLP

## 2015-07-24 NOTE — Progress Notes (Signed)
ANTIBIOTIC CONSULT NOTE - INITIAL  Pharmacy Consult for Ancef Indication: bacteremia  Allergies  Allergen Reactions  . Biphosphate   . Codeine Swelling  . Morphine And Related Other (See Comments)    unknown  . Percocet [Oxycodone-Acetaminophen]     unknown  . Plavix [Clopidogrel Bisulfate] Other (See Comments)    RASH  . Sulfur Other (See Comments)    GI upset    Patient Measurements: Height: 5\' 4"  (162.6 cm) Weight: 152 lb 12.5 oz (69.3 kg) IBW/kg (Calculated) : 54.7  Vital Signs: Temp: 98 F (36.7 C) (01/24 1216) Temp Source: Oral (01/24 1216) BP: 171/73 mmHg (01/24 1314) Pulse Rate: 76 (01/24 1314) Intake/Output from previous day: 01/23 0701 - 01/24 0700 In: 156 [I.V.:6; IV Piggyback:150] Out: -  Intake/Output from this shift: Total I/O In: 53 [I.V.:3; IV Piggyback:50] Out: -   Labs:  Recent Labs  07/22/15 1123 07/23/15 0226 07/24/15 1122  WBC 27.8* 20.7* 18.1*  HGB 13.0 9.9* 10.0*  PLT 116* 94* 204  CREATININE 1.78* 1.61* 1.54*   Estimated Creatinine Clearance: 26.4 mL/min (by C-G formula based on Cr of 1.54). No results for input(s): VANCOTROUGH, VANCOPEAK, VANCORANDOM, GENTTROUGH, GENTPEAK, GENTRANDOM, TOBRATROUGH, TOBRAPEAK, TOBRARND, AMIKACINPEAK, AMIKACINTROU, AMIKACIN in the last 72 hours.   Microbiology: Recent Results (from the past 720 hour(s))  Urine culture     Status: None   Collection Time: 07/22/15 11:10 AM  Result Value Ref Range Status   Specimen Description URINE, CATHETERIZED  Final   Special Requests NONE  Final   Culture >=100,000 COLONIES/mL ESCHERICHIA COLI  Final   Report Status 07/24/2015 FINAL  Final   Organism ID, Bacteria ESCHERICHIA COLI  Final      Susceptibility   Escherichia coli - MIC*    AMPICILLIN >=32 RESISTANT Resistant     CEFAZOLIN <=4 SENSITIVE Sensitive     CEFTRIAXONE <=1 SENSITIVE Sensitive     CIPROFLOXACIN <=0.25 SENSITIVE Sensitive     GENTAMICIN 2 SENSITIVE Sensitive     IMIPENEM <=0.25 SENSITIVE  Sensitive     NITROFURANTOIN <=16 SENSITIVE Sensitive     TRIMETH/SULFA <=20 SENSITIVE Sensitive     AMPICILLIN/SULBACTAM >=32 RESISTANT Resistant     PIP/TAZO <=4 SENSITIVE Sensitive     * >=100,000 COLONIES/mL ESCHERICHIA COLI  Culture, blood (routine x 2)     Status: None (Preliminary result)   Collection Time: 07/22/15 11:23 AM  Result Value Ref Range Status   Specimen Description BLOOD RIGHT ANTECUBITAL  Final   Special Requests BOTTLES DRAWN AEROBIC AND ANAEROBIC 5ML  Final   Culture  Setup Time   Final    GRAM NEGATIVE RODS IN BOTH AEROBIC AND ANAEROBIC BOTTLES CRITICAL RESULT CALLED TO, READ BACK BY AND VERIFIED WITH: Dolan Amen RN [id number] GREEN R CONFIRMED BY [place]    Culture ESCHERICHIA COLI  Final   Report Status PENDING  Incomplete  MRSA PCR Screening     Status: None   Collection Time: 07/22/15  6:22 PM  Result Value Ref Range Status   MRSA by PCR NEGATIVE NEGATIVE Final    Comment:        The GeneXpert MRSA Assay (FDA approved for NASAL specimens only), is one component of a comprehensive MRSA colonization surveillance program. It is not intended to diagnose MRSA infection nor to guide or monitor treatment for MRSA infections.   Culture, blood (routine x 2)     Status: None (Preliminary result)   Collection Time: 07/22/15  7:05 PM  Result Value Ref  Range Status   Specimen Description BLOOD LEFT ANTECUBITAL  Final   Special Requests IN PEDIATRIC BOTTLE 1CC  Final   Culture  Setup Time   Final    GRAM NEGATIVE RODS PEDIATRIC BOTTLE CRITICAL RESULT CALLED TO, READ BACK BY AND VERIFIED WITH: T. HOOD,RN AT [id number] ON [id number] BY S. YARBROUGH    Culture ESCHERICHIA COLI  Final   Report Status PENDING  Incomplete  Culture, blood (routine x 2)     Status: None (Preliminary result)   Collection Time: 07/23/15 12:22 PM  Result Value Ref Range Status   Specimen Description BLOOD LEFT WRIST  Final   Special Requests BOTTLES DRAWN AEROBIC AND ANAEROBIC 5 CC   Final   Culture NO GROWTH 1 DAY  Final   Report Status PENDING  Incomplete  Culture, blood (routine x 2)     Status: None (Preliminary result)   Collection Time: 07/23/15 12:27 PM  Result Value Ref Range Status   Specimen Description BLOOD LEFT HAND  Final   Special Requests IN PEDIATRIC BOTTLE 2 CC  Final   Culture NO GROWTH 1 DAY  Final   Report Status PENDING  Incomplete   Assessment: 21 YOF with bacteremia likely from UTI.   Vanc 1/22 >>1/23 Zosyn 1/22>> 1/24 Cefazolin 1/24>>  1/22 BCx2: 2/2 EColi  1/22 UCx: E coli (S to ancef, ceftriaxone; R to unasysn, ampicillin)  WBC 18.1, afebrile.  SCr 1.52, eCrCL ~25-59mL/min.  Goal of Therapy:  proper dosing based on renal and hepatic function  Plan:  -Ancef 2g IV q12h -follow renal function, repeat BCx to document clearance, LOT  Makaiya Geerdes D. Sola Margolis, PharmD, BCPS Clinical Pharmacist Pager: [phone] 07/24/2015 4:26 PM

## 2015-07-24 NOTE — Progress Notes (Signed)
[facility] Progress Note  Patient: [name] [id number]   PCP: [name], MD DOB: 07-Dec-1929   DOA: 07/22/2015   DOS: 07/24/2015   Date of Service: the patient was seen and examined on 07/24/2015  Subjective: Some shortness of breath but no chest pain and abdominal pain nausea or vomiting. No diarrhea. No constipation. Nutrition: Able to tolerate oral diet Activity: Bedridden at present Last BM: 07/24/2015  Assessment and Plan: 1. Escherichia coli sepsis [facility]) Patient presents with confusion and lethargy. She was also hypoxic tachycardic had fever. Meeting the criteria for sepsis, Blood culture is growing gram-negative rods, urine is growing E coli. Sensitivities pending.  Continuing Zosyn. MRSA negative. Influenza PCR is negative although the respiratory panel has been pending and therefore continuing droplet precaution. Rechecking the blood culture to ensure resolution. Pro-calcitonin 38.82  2. Dysphagia. Patient was having difficulty swallowing in the ER. Has dysphagia at her baseline. Appreciate input from speech therapy who recommends dysphagia type II diet.  3.  GERD. Continuing PPI.  4. Hypothyroidism. Continuing Synthroid.  5. Chronic steroid therapy for her rheumatoid arthritis Blood pressure stable to resume prednisone home dose.  6. Essential hypertension. Continuing Lopressor 25 mg twice a day.  7. Chronic diastolic dysfunction. Appears euvolemic at present.  8. Iron deficiency anemia. Continuing supplements. Stable.  9. Thrombocytopenia. Most likely in the setting of infection as well as dilution due to fluids. Discontinue DVT prophylaxis pharmacological and place her on mechanical prophylaxis.  DVT Prophylaxis: mechanical compression device. Nutrition: Dysphagia type II cardiac diet Advance goals of care discussion: Full code  Brief Summary of Hospitalization:  HPI: As per the H and P dictated on admission, "This is an 58 female patient  resident of [name] since May 2016 who presented to the hospital report of fever. Patient has a past medical history of old CVA and resultant dysphagia, CAD, hypothyroidism, rheumatoid arthritis on chronic steroids, hypertension and associated HOCM with chronic diastolic heart failure, dyslipidemia, anemia and chronic kidney disease. Per nursing home records patient developed nausea and vomiting without abdominal pain or diarrhea on 1/10; this was associated with fever. Urinalysis had been obtained and was negative for UTI. Follow-up evaluation on 1/12 and a straight resolution of vomiting the patient was having coughing with wheezing and concerns were for possible aspiration so a chest x-ray was obtained that did not reveal evidence of pneumonia or other focal infiltrate. According to family at bedside and patient apparently had some improvement although for the past 1 week she has really not eaten much. The past 2 days patient has developed progressive confusion and recurrence of her nausea and vomiting and much higher fevers now up to 102.2 at the facility. During initial episodic nausea and again have been tried but was too sedating therefore patient was transitioned and Zofran but remained nauseated. Of note recently patient has had change in urine to being more malodorous and dark"  Daily update, Procedures: Blood cultures positive for gram-negative rods 07/23/2015, urine positive for Escherichia coli. Consultants: None Antibiotics: Anti-infectives    Start     Dose/Rate Route Frequency Ordered Stop   07/23/15 1200  vancomycin (VANCOCIN) IVPB 750 mg/150 ml premix  Status:  Discontinued     750 mg 150 mL/hr over 60 Minutes Intravenous Every 24 hours 07/22/15 1303 07/23/15 1127   07/22/15 1800  piperacillin-tazobactam (ZOSYN) IVPB 3.375 g     3.375 g 12.5 mL/hr over 240 Minutes Intravenous Every 8 hours 07/22/15 1303  07/22/15 1145  piperacillin-tazobactam (ZOSYN)  IVPB 3.375 g     3.375 g 100 mL/hr over 30 Minutes Intravenous  Once 07/22/15 1132 07/22/15 1215   07/22/15 1145  vancomycin (VANCOCIN) IVPB 1000 mg/200 mL premix     1,000 mg 200 mL/hr over 60 Minutes Intravenous  Once 07/22/15 1132 07/22/15 1300      Family Communication:  No family was present at bedside, at the time of interview.   Disposition:  Barriers to safe discharge: Improvement and sepsis picture   Intake/Output Summary (Last 24 hours) at 07/24/15 1024 Last data filed at 07/24/15 0200  Gross per 24 hour  Intake     53 ml  Output      0 ml  Net     53 ml   Filed Weights   07/22/15 1712 07/23/15 0232 07/24/15 0614  Weight: 67 kg (147 lb 11.3 oz) 68.4 kg (150 lb 12.7 oz) 69.3 kg (152 lb 12.5 oz)    Objective: Physical Exam: Filed Vitals:   07/24/15 0137 07/24/15 0400 07/24/15 0614 07/24/15 0700  BP: 138/125 157/71  175/84  Pulse:  83  85  Temp:  98.7 F (37.1 C)  98.6 F (37 C)  TempSrc:  Oral  Oral  Resp:  21  18  Height:      Weight:   69.3 kg (152 lb 12.5 oz)   SpO2:  99%  100%    General: Appear in mild distress, no Rash; Oral Mucosa moist. Cardiovascular: S1 and S2 Present, no Murmur, no JVD Respiratory: Bilateral Air entry present and Clear to Auscultation, no Crackles, no wheezes Abdomen: Bowel Sound present, Soft and no tenderness Extremities: no Pedal edema, no calf tenderness  Data Reviewed: CBC:  Recent Labs Lab 07/22/15 1123 07/23/15 0226  WBC 27.8* 20.7*  NEUTROABS 24.0*  --   HGB 13.0 9.9*  HCT 39.7 30.9*  MCV 88.6 90.4  PLT 116* 94*   Basic Metabolic Panel:  Recent Labs Lab 07/22/15 1123 07/23/15 0226  NA 144 145  K 3.5 3.8  CL 101 112*  CO2 26 24  GLUCOSE 147* 87  BUN 25* 22*  CREATININE 1.78* 1.61*  CALCIUM 11.3* 8.8*   Liver Function Tests:  Recent Labs Lab 07/22/15 1123 07/23/15 0226  AST 23 29  ALT 15 13*  ALKPHOS 66 46  BILITOT 1.1 0.8  PROT 6.3* 4.9*  ALBUMIN 2.9* 2.1*   No results for input(s):  LIPASE, AMYLASE in the last 168 hours. No results for input(s): AMMONIA in the last 168 hours.  Cardiac Enzymes: No results for input(s): CKTOTAL, CKMB, CKMBINDEX, TROPONINI in the last 168 hours.  BNP (last 3 results)  Recent Labs  10/29/14 1027  BNP 1045.3*    CBG: No results for input(s): GLUCAP in the last 168 hours.  Recent Results (from the past 240 hour(s))  Urine culture     Status: None (Preliminary result)   Collection Time: 07/22/15 11:10 AM  Result Value Ref Range Status   Specimen Description URINE, CATHETERIZED  Final   Special Requests NONE  Final   Culture >=100,000 COLONIES/mL ESCHERICHIA COLI  Final   Report Status PENDING  Incomplete  Culture, blood (routine x 2)     Status: None (Preliminary result)   Collection Time: 07/22/15 11:23 AM  Result Value Ref Range Status   Specimen Description RIGHT ANTECUBITAL  Final   Special Requests BOTTLES DRAWN AEROBIC AND ANAEROBIC 5ML  Final   Culture  Setup Time   Final  GRAM NEGATIVE RODS IN BOTH AEROBIC AND ANAEROBIC BOTTLES CRITICAL RESULT CALLED TO, READ BACK BY AND VERIFIED WITH: Dolan Amen RN [phone] GREEN R CONFIRMED BY M. CAMPBELL    Culture NO GROWTH 1 DAY  Final   Report Status PENDING  Incomplete  MRSA PCR Screening     Status: None   Collection Time: 07/22/15  6:22 PM  Result Value Ref Range Status   MRSA by PCR NEGATIVE NEGATIVE Final    Comment:        The GeneXpert MRSA Assay (FDA approved for NASAL specimens only), is one component of a comprehensive MRSA colonization surveillance program. It is not intended to diagnose MRSA infection nor to guide or monitor treatment for MRSA infections.   Culture, blood (routine x 2)     Status: None (Preliminary result)   Collection Time: 07/22/15  7:05 PM  Result Value Ref Range Status   Specimen Description BLOOD LEFT ANTECUBITAL  Final   Special Requests IN PEDIATRIC BOTTLE 1CC  Final   Culture  Setup Time   Final    GRAM NEGATIVE  RODS PEDIATRIC BOTTLE CRITICAL RESULT CALLED TO, READ BACK BY AND VERIFIED WITH: T. HOOD,RN AT [id number] ON [id number] BY S. YARBROUGH    Culture NO GROWTH < 24 HOURS  Final   Report Status PENDING  Incomplete     Studies: No results found.   Scheduled Meds: . ferrous sulfate  325 mg Oral TID WC  . levothyroxine  50 mcg Oral QAC breakfast  . metoprolol tartrate  25 mg Oral BID  . montelukast  10 mg Oral QHS  . pantoprazole  40 mg Oral Daily  . piperacillin-tazobactam (ZOSYN)  IV  3.375 g Intravenous Q8H  . predniSONE  10 mg Oral Daily  . rosuvastatin  20 mg Oral QPM  . saccharomyces boulardii  250 mg Oral BID  . sertraline  50 mg Oral q morning - 10a  . sodium chloride  3 mL Intravenous Q12H   Continuous Infusions:  PRN Meds: acetaminophen **OR** acetaminophen, hydrALAZINE, ipratropium-albuterol, methocarbamol, ondansetron (ZOFRAN) IV  Time spent: 30 minutes  Author: Berle Mull, MD [facility] Pager: [phone] 07/24/2015 10:24 AM  If 7PM-7AM, please contact night-coverage at [email or web address], password [facility]

## 2015-07-25 DIAGNOSIS — D649 Anemia, unspecified: Secondary | ICD-10-CM

## 2015-07-25 LAB — BASIC METABOLIC PANEL
ANION GAP: 11 (ref 5–15)
BUN: 18 mg/dL (ref 6–20)
CALCIUM: 8.4 mg/dL — AB (ref 8.9–10.3)
CO2: 21 mmol/L — AB (ref 22–32)
Chloride: 109 mmol/L (ref 101–111)
Creatinine, Ser: 1.52 mg/dL — ABNORMAL HIGH (ref 0.44–1.00)
GFR, EST AFRICAN AMERICAN: 35 mL/min — AB (ref >60)
GFR, EST NON AFRICAN AMERICAN: 31 mL/min — AB (ref >60)
Glucose, Bld: 83 mg/dL (ref 65–99)
POTASSIUM: 3.4 mmol/L — AB (ref 3.5–5.1)
Sodium: 141 mmol/L (ref 135–145)

## 2015-07-25 LAB — CULTURE, BLOOD (ROUTINE X 2)

## 2015-07-25 LAB — RESPIRATORY VIRUS PANEL
Adenovirus: NEGATIVE
Influenza A: NEGATIVE
Influenza B: NEGATIVE
METAPNEUMOVIRUS: NEGATIVE
PARAINFLUENZA 3 A: NEGATIVE
Parainfluenza 1: NEGATIVE
Parainfluenza 2: NEGATIVE
RESPIRATORY SYNCYTIAL VIRUS A: NEGATIVE
Respiratory Syncytial Virus B: NEGATIVE
Rhinovirus: NEGATIVE

## 2015-07-25 LAB — CBC
HEMATOCRIT: 32.6 % — AB (ref 36.0–46.0)
HEMOGLOBIN: 10.5 g/dL — AB (ref 12.0–15.0)
MCH: 28.8 pg (ref 26.0–34.0)
MCHC: 32.2 g/dL (ref 30.0–36.0)
MCV: 89.6 fL (ref 78.0–100.0)
Platelets: 128 10*3/uL — ABNORMAL LOW (ref 150–400)
RBC: 3.64 MIL/uL — AB (ref 3.87–5.11)
RDW: 14.9 % (ref 11.5–15.5)
WBC: 17.5 10*3/uL — ABNORMAL HIGH (ref 4.0–10.5)

## 2015-07-25 LAB — MAGNESIUM: MAGNESIUM: 1.4 mg/dL — AB (ref 1.7–2.4)

## 2015-07-25 MED ORDER — PROMETHAZINE HCL 25 MG/ML IJ SOLN
12.5000 mg | Freq: Once | INTRAMUSCULAR | Status: AC
  Administered 2015-07-25: 12.5 mg via INTRAVENOUS
  Filled 2015-07-25: qty 1

## 2015-07-25 MED ORDER — HYDRALAZINE HCL 50 MG PO TABS
50.0000 mg | ORAL_TABLET | Freq: Three times a day (TID) | ORAL | Status: DC
  Administered 2015-07-25 – 2015-07-26 (×5): 50 mg via ORAL
  Filled 2015-07-25 (×5): qty 1

## 2015-07-25 MED ORDER — POTASSIUM CHLORIDE CRYS ER 20 MEQ PO TBCR
20.0000 meq | EXTENDED_RELEASE_TABLET | Freq: Once | ORAL | Status: AC
  Administered 2015-07-25: 20 meq via ORAL
  Filled 2015-07-25: qty 1

## 2015-07-25 NOTE — Care Management Important Message (Signed)
Important Message  Patient Details  Name: Torriana Gahm MRN: [id number] Date of Birth: 1929-11-04   Medicare Important Message Given:  Yes    Nathen May 07/25/2015, 12:18 PM

## 2015-07-25 NOTE — Progress Notes (Signed)
Patient's BP 211/96. Patient denies pain, headache, or discomfort. Scheduled Hydralazine 25 mg PO given @ 0611. Recheck BP 194/86 @ 0630. PRN Hydralazine 10 mg IV given. Will continue to monitor patient.

## 2015-07-25 NOTE — Progress Notes (Signed)
Pt c/o persistent nausea. EKG showed NSR, possible inferior infarct. Paged Dr Tana Coast. Waiting to hear back from Dr.

## 2015-07-25 NOTE — Progress Notes (Signed)
Pt c/o nausea. Zofran already given. Paged dr Tana Coast. Waiting to hear back from dr.

## 2015-07-25 NOTE — Progress Notes (Signed)
[facility]                                                                              Patient Demographics  [name], is a 82 y.o. female, DOB - August 05, 1929, JOI:[id number]  Admit date - 07/22/2015   Admitting Physician Allie Bossier, MD  Outpatient Primary MD for the patient is [name], MD  LOS - 3   Chief Complaint  Patient presents with  . Fever       Brief HPI   Patient is a 82 year old female, SNF presented to ED with fevers. Patient has a past medical history of old CVA and resultant dysphagia, CAD, hypothyroidism, rheumatoid arthritis on chronic steroids, hypertension and associated HOCM with chronic diastolic heart failure, dyslipidemia, anemia and chronic kidney disease. Per nursing home records patient developed nausea and vomiting without abdominal pain or diarrhea on 1/10; this was associated with fever. Urinalysis had been obtained and was negative for UTI. On 1/12, patient was coughing with wheezing, chest x-ray did not show any pneumonia or infiltrates. Per family she apparently had some improvement over the past one week prior to admission but not eating much. 2 days prior to admission, patient developed progressive confusion and recurrence of her nausea and vomiting, fevers up to 102.2 at the facility. Of note recently patient has had change in urine to being more malodorous and dark. During hospitalization blood cultures and urine cultures have shown Escherichia coli sepsis   Assessment & Plan    Escherichia coli sepsis (HCC)/bacteremia secondary to urinary tract infection - Patient met sepsis criteria with leukocytosis, fevers, UTI, tachycardia - Blood cultures growing Escherichia coli, sensitivities, currently on Ancef - Repeat blood cultures so far negative - Pro-calcitonin 38.82, WBC count 17.5 today - Transitioned to oral antibiotics, ciprofloxacin or Keflex for 2 weeks at discharge. Will check EKG for QTC, if not prolonged, will  transition to ciprofloxacin, also monitor creatinine function  Dysphagia. Patient was having difficulty swallowing in the ER. Has dysphagia at her baseline.  - Speech therapy consulted, recommended continue regular diet  GERD. Continuing PPI.   Hypothyroidism. Continuing Synthroid.   Chronic steroid therapy for her rheumatoid arthritis Blood pressure stable to resume prednisone home dose.  Hypertension uncontrolled Continuing Lopressor 25 mg twice a day, increase hydralazine to 75m TID.  Acute kidney injury on chronic kidney disease stage III - Patient presented with creatinine function of 1.78, improving to 1.5 today  Chronic diastolic dysfunction. Appears euvolemic at present.   Iron deficiency anemia. Continuing supplements. Stable.  Thrombocytopenia. Most likely in the setting of infection as well as dilution due to fluids. Continue SCDs  Code Status: Full code  Family Communication: Discussed in detail with the patient, all imaging results, lab results explained to the patient    Disposition Plan: Hopefully DC to skilled nursing facility in a.m. if improving  Time Spent in minutes  25 minutes  Procedures  NONE   Consults   NONE   DVT Prophylaxis  SCD's  Medications  Scheduled Meds: .  ceFAZolin (ANCEF) IV  2 g Intravenous Q12H  . ferrous sulfate  325 mg Oral TID WC  .  hydrALAZINE  50 mg Oral 3 times per day  . levothyroxine  50 mcg Oral QAC breakfast  . metoprolol tartrate  25 mg Oral BID  . montelukast  10 mg Oral QHS  . pantoprazole  40 mg Oral Daily  . predniSONE  10 mg Oral Daily  . rosuvastatin  20 mg Oral QPM  . saccharomyces boulardii  250 mg Oral BID  . sertraline  50 mg Oral q morning - 10a  . sodium chloride  3 mL Intravenous Q12H   Continuous Infusions:  PRN Meds:.acetaminophen **OR** acetaminophen, hydrALAZINE, ipratropium-albuterol, methocarbamol, ondansetron (ZOFRAN) IV   Antibiotics   Anti-infectives    Start     Dose/Rate  Route Frequency Ordered Stop   07/24/15 1400  ceFAZolin (ANCEF) IVPB 2 g/50 mL premix     2 g 100 mL/hr over 30 Minutes Intravenous Every 12 hours 07/24/15 1340     07/23/15 1200  vancomycin (VANCOCIN) IVPB 750 mg/150 ml premix  Status:  Discontinued     750 mg 150 mL/hr over 60 Minutes Intravenous Every 24 hours 07/22/15 1303 07/23/15 1127   07/22/15 1800  piperacillin-tazobactam (ZOSYN) IVPB 3.375 g  Status:  Discontinued     3.375 g 12.5 mL/hr over 240 Minutes Intravenous Every 8 hours 07/22/15 1303 07/24/15 1340   07/22/15 1145  piperacillin-tazobactam (ZOSYN) IVPB 3.375 g     3.375 g 100 mL/hr over 30 Minutes Intravenous  Once 07/22/15 1132 07/22/15 1215   07/22/15 1145  vancomycin (VANCOCIN) IVPB 1000 mg/200 mL premix     1,000 mg 200 mL/hr over 60 Minutes Intravenous  Once 07/22/15 1132 07/22/15 1300        Subjective:   [name] was seen and examined today. Patient denies dizziness, chest pain, shortness of breath, abdominal pain, N/V/D/C, new weakness, numbess, tingling. No acute events overnight.  No fevers today. Feeling weak.  Objective:   Blood pressure 170/88, pulse 93, temperature 98.3 F (36.8 C), temperature source Oral, resp. rate 21, height _0  (1.626 m), weight 70.1 kg (154 lb 8.7 oz), SpO2 98 %.  Wt Readings from Last 3 Encounters:  07/25/15 70.1 kg (154 lb 8.7 oz)  07/12/15 65.772 kg (145 lb)  06/28/15 65.772 kg (145 lb)     Intake/Output Summary (Last 24 hours) at 07/25/15 1212 Last data filed at 07/25/15 4917  Gross per 24 hour  Intake    770 ml  Output      0 ml  Net    770 ml    Exam  General: Alert and oriented x2, NAD  HEENT:  PERRLA, EOMI  Neck: Supple, no JVD, no masses  CVS: S1 S2 CLEAR  Respiratory: Decreased breath sounds at the bases  Abdomen: Soft, nontender, nondistended, + bowel sounds  Ext: no cyanosis clubbing or edema  Neuro: no new deficits  Skin: No rashes  Psych: Normal affect and demeanor, alert and  oriented x3    Data Review   Micro Results Recent Results (from the past 240 hour(s))  Urine culture     Status: None   Collection Time: 07/22/15 11:10 AM  Result Value Ref Range Status   Specimen Description URINE, CATHETERIZED  Final   Special Requests NONE  Final   Culture >=100,000 COLONIES/mL ESCHERICHIA COLI  Final   Report Status 07/24/2015 FINAL  Final   Organism ID, Bacteria ESCHERICHIA COLI  Final      Susceptibility   Escherichia coli - MIC*    AMPICILLIN >=32 RESISTANT Resistant  CEFAZOLIN <=4 SENSITIVE Sensitive     CEFTRIAXONE <=1 SENSITIVE Sensitive     CIPROFLOXACIN <=0.25 SENSITIVE Sensitive     GENTAMICIN 2 SENSITIVE Sensitive     IMIPENEM <=0.25 SENSITIVE Sensitive     NITROFURANTOIN <=16 SENSITIVE Sensitive     TRIMETH/SULFA <=20 SENSITIVE Sensitive     AMPICILLIN/SULBACTAM >=32 RESISTANT Resistant     PIP/TAZO <=4 SENSITIVE Sensitive     * >=100,000 COLONIES/mL ESCHERICHIA COLI  Culture, blood (routine x 2)     Status: None   Collection Time: 07/22/15 11:23 AM  Result Value Ref Range Status   Specimen Description BLOOD RIGHT ANTECUBITAL  Final   Special Requests BOTTLES DRAWN AEROBIC AND ANAEROBIC 5ML  Final   Culture  Setup Time   Final    GRAM NEGATIVE RODS IN BOTH AEROBIC AND ANAEROBIC BOTTLES CRITICAL RESULT CALLED TO, READ BACK BY AND VERIFIED WITH: Dolan Amen RN [phone] GREEN R CONFIRMED BY [place]    Culture   Final    ESCHERICHIA COLI SUSCEPTIBILITIES PERFORMED ON PREVIOUS CULTURE WITHIN THE LAST 5 DAYS.    Report Status 07/25/2015 FINAL  Final  Respiratory virus panel     Status: None   Collection Time: 07/22/15  2:10 PM  Result Value Ref Range Status   Respiratory Syncytial Virus A Negative Negative Final   Respiratory Syncytial Virus B Negative Negative Final   Influenza A Negative Negative Final   Influenza B Negative Negative Final   Parainfluenza 1 Negative Negative Final   Parainfluenza 2 Negative Negative Final    Parainfluenza 3 Negative Negative Final   Metapneumovirus Negative Negative Final   Rhinovirus Negative Negative Final   Adenovirus Negative Negative Final    Comment: (NOTE) Performed At: [facility] [address] [place], [place] [id number] Lindon Romp MD WU:[id number]   MRSA PCR Screening     Status: None   Collection Time: 07/22/15  6:22 PM  Result Value Ref Range Status   MRSA by PCR NEGATIVE NEGATIVE Final    Comment:        The GeneXpert MRSA Assay (FDA approved for NASAL specimens only), is one component of a comprehensive MRSA colonization surveillance program. It is not intended to diagnose MRSA infection nor to guide or monitor treatment for MRSA infections.   Culture, blood (routine x 2)     Status: None   Collection Time: 07/22/15  7:05 PM  Result Value Ref Range Status   Specimen Description BLOOD LEFT ANTECUBITAL  Final   Special Requests IN PEDIATRIC BOTTLE 1CC  Final   Culture  Setup Time   Final    GRAM NEGATIVE RODS PEDIATRIC BOTTLE CRITICAL RESULT CALLED TO, READ BACK BY AND VERIFIED WITH: T. HOOD,RN AT [phone] ON [id number] BY S. YARBROUGH    Culture ESCHERICHIA COLI  Final   Report Status 07/25/2015 FINAL  Final   Organism ID, Bacteria ESCHERICHIA COLI  Final      Susceptibility   Escherichia coli - MIC*    AMPICILLIN >=32 RESISTANT Resistant     CEFAZOLIN <=4 SENSITIVE Sensitive     CEFEPIME <=1 SENSITIVE Sensitive     CEFTAZIDIME <=1 SENSITIVE Sensitive     CEFTRIAXONE <=1 SENSITIVE Sensitive     CIPROFLOXACIN <=0.25 SENSITIVE Sensitive     GENTAMICIN <=1 SENSITIVE Sensitive     IMIPENEM <=0.25 SENSITIVE Sensitive     TRIMETH/SULFA <=20 SENSITIVE Sensitive     AMPICILLIN/SULBACTAM 16 INTERMEDIATE Intermediate     PIP/TAZO <=4 SENSITIVE Sensitive     *  ESCHERICHIA COLI  Culture, blood (routine x 2)     Status: None (Preliminary result)   Collection Time: 07/23/15 12:22 PM  Result Value Ref Range Status   Specimen Description BLOOD  LEFT WRIST  Final   Special Requests BOTTLES DRAWN AEROBIC AND ANAEROBIC 5 CC  Final   Culture NO GROWTH 2 DAYS  Final   Report Status PENDING  Incomplete  Culture, blood (routine x 2)     Status: None (Preliminary result)   Collection Time: 07/23/15 12:27 PM  Result Value Ref Range Status   Specimen Description BLOOD LEFT HAND  Final   Special Requests IN PEDIATRIC BOTTLE 2 CC  Final   Culture NO GROWTH 2 DAYS  Final   Report Status PENDING  Incomplete    Radiology Reports Dg Chest 1 View  06/28/2015  CLINICAL DATA:  Follow-up pneumonia. EXAM: CHEST 1 VIEW COMPARISON:  Chest radiograph 11/02/2014 FINDINGS: Stable cardiac and mediastinal contours. Large hiatal hernia. Significant interval improvement in previously described airspace opacities with minimal residual heterogeneous opacity left lung base. No pleural effusion or pneumothorax. There appears to be interval fracture of the right Harrington rod. IMPRESSION: Near complete resolution of previously described bilateral airspace opacities. Minimal residual atelectasis left lung base. Large hiatal hernia. Interval fracture of the mid aspect of the right Harrington rod. Electronically Signed   By: Lovey Newcomer M.D.   On: 06/28/2015 16:42   Dg Chest 2 View  07/22/2015  CLINICAL DATA:  Fever and hypoxia EXAM: CHEST - 2 VIEW COMPARISON:  06/28/2015 FINDINGS: Cardiac shadow is stable. A hiatal hernia is again seen. Fixation hardware is again noted within the thoracolumbar spine. The previously noted fracture of the right Harrington rod is again visualized. No acute bony abnormality is seen. No focal infiltrate is noted. IMPRESSION: No acute abnormality seen. Electronically Signed   By: Inez Catalina M.D.   On: 07/22/2015 13:27   Dg Chest Port 1 View  07/23/2015  CLINICAL DATA:  Sepsis EXAM: PORTABLE CHEST 1 VIEW COMPARISON:  July 22, 2015 FINDINGS: There is slight left base atelectasis. Lungs elsewhere clear. Heart is upper normal in size with  pulmonary vascularity within normal limits. Hiatal hernia present. Rod fixation in the thoracic and lumbar regions is stable. There is atherosclerotic calcification in the aorta. There is evidence of old trauma involving the proximal right humerus. IMPRESSION: Slight left base atelectasis. No edema or consolidation. No change in cardiac silhouette. Hiatal hernia present. Electronically Signed   By: Lowella Grip III M.D.   On: 07/23/2015 07:04    CBC  Recent Labs Lab 07/22/15 1123 07/23/15 0226 07/24/15 1122 07/25/15 0706  WBC 27.8* 20.7* 18.1* 17.5*  HGB 13.0 9.9* 10.0* 10.5*  HCT 39.7 30.9* 31.0* 32.6*  PLT 116* 94* 204 128*  MCV 88.6 90.4 88.8 89.6  MCH 29.0 28.9 28.7 28.8  MCHC 32.7 32.0 32.3 32.2  RDW 14.5 14.9 14.9 14.9  LYMPHSABS 1.9  --   --   --   MONOABS 1.9*  --   --   --   EOSABS 0.0  --   --   --   BASOSABS 0.0  --   --   --     Chemistries   Recent Labs Lab 07/22/15 1123 07/23/15 0226 07/24/15 1122 07/25/15 0706  NA 144 145 143 141  K 3.5 3.8 3.0* 3.4*  CL 101 112* 107 109  CO2 26 24 21* 21*  GLUCOSE 147* 87 132* 83  BUN 25* 22*  20 18  CREATININE 1.78* 1.61* 1.54* 1.52*  CALCIUM 11.3* 8.8* 8.5* 8.4*  MG  --   --   --  1.4*  AST 23 29  --   --   ALT 15 13*  --   --   ALKPHOS 66 46  --   --   BILITOT 1.1 0.8  --   --    ------------------------------------------------------------------------------------------------------------------ estimated creatinine clearance is 27 mL/min (by C-G formula based on Cr of 1.52). ------------------------------------------------------------------------------------------------------------------ No results for input(s): HGBA1C in the last 72 hours. ------------------------------------------------------------------------------------------------------------------ No results for input(s): CHOL, HDL, LDLCALC, TRIG, CHOLHDL, LDLDIRECT in the last 72  hours. ------------------------------------------------------------------------------------------------------------------  Recent Labs  07/22/15 1905  TSH 0.708   ------------------------------------------------------------------------------------------------------------------ No results for input(s): VITAMINB12, FOLATE, FERRITIN, TIBC, IRON, RETICCTPCT in the last 72 hours.  Coagulation profile No results for input(s): INR, PROTIME in the last 168 hours.  No results for input(s): DDIMER in the last 72 hours.  Cardiac Enzymes No results for input(s): CKMB, TROPONINI, MYOGLOBIN in the last 168 hours.  Invalid input(s): CK ------------------------------------------------------------------------------------------------------------------ Invalid input(s): POCBNP  No results for input(s): GLUCAP in the last 72 hours.   Taite Schoeppner M.D. [facility] 07/25/2015, 12:12 PM  Pager: [phone] Between 7am to 7pm - call Pager - [phone]  After 7pm go to [email or web address] - password [email or web address]  Call night coverage person covering after 7pm

## 2015-07-25 NOTE — Progress Notes (Signed)
Recheck BP 170/88. Patient denies pain, headache, or discomfort. Bed in lowest position, call bell within reach, bed alarm on. No other needs expressed at the moment.

## 2015-07-26 DIAGNOSIS — R5381 Other malaise: Secondary | ICD-10-CM | POA: Diagnosis not present

## 2015-07-26 DIAGNOSIS — G9341 Metabolic encephalopathy: Secondary | ICD-10-CM | POA: Diagnosis not present

## 2015-07-26 DIAGNOSIS — N183 Chronic kidney disease, stage 3 (moderate): Secondary | ICD-10-CM | POA: Diagnosis not present

## 2015-07-26 DIAGNOSIS — S9032XA Contusion of left foot, initial encounter: Secondary | ICD-10-CM | POA: Diagnosis not present

## 2015-07-26 DIAGNOSIS — S8002XA Contusion of left knee, initial encounter: Secondary | ICD-10-CM | POA: Diagnosis not present

## 2015-07-26 DIAGNOSIS — Z79899 Other long term (current) drug therapy: Secondary | ICD-10-CM | POA: Diagnosis not present

## 2015-07-26 DIAGNOSIS — A4151 Sepsis due to Escherichia coli [E. coli]: Secondary | ICD-10-CM | POA: Diagnosis not present

## 2015-07-26 DIAGNOSIS — Y998 Other external cause status: Secondary | ICD-10-CM | POA: Diagnosis not present

## 2015-07-26 DIAGNOSIS — R609 Edema, unspecified: Secondary | ICD-10-CM | POA: Diagnosis not present

## 2015-07-26 DIAGNOSIS — F329 Major depressive disorder, single episode, unspecified: Secondary | ICD-10-CM | POA: Diagnosis not present

## 2015-07-26 DIAGNOSIS — M05712 Rheumatoid arthritis with rheumatoid factor of left shoulder without organ or systems involvement: Secondary | ICD-10-CM | POA: Diagnosis not present

## 2015-07-26 DIAGNOSIS — Z8711 Personal history of peptic ulcer disease: Secondary | ICD-10-CM | POA: Diagnosis not present

## 2015-07-26 DIAGNOSIS — D509 Iron deficiency anemia, unspecified: Secondary | ICD-10-CM | POA: Diagnosis not present

## 2015-07-26 DIAGNOSIS — E038 Other specified hypothyroidism: Secondary | ICD-10-CM | POA: Diagnosis not present

## 2015-07-26 DIAGNOSIS — W1839XA Other fall on same level, initial encounter: Secondary | ICD-10-CM | POA: Diagnosis not present

## 2015-07-26 DIAGNOSIS — R399 Unspecified symptoms and signs involving the genitourinary system: Secondary | ICD-10-CM | POA: Diagnosis not present

## 2015-07-26 DIAGNOSIS — R2681 Unsteadiness on feet: Secondary | ICD-10-CM | POA: Diagnosis not present

## 2015-07-26 DIAGNOSIS — R6520 Severe sepsis without septic shock: Secondary | ICD-10-CM | POA: Diagnosis not present

## 2015-07-26 DIAGNOSIS — D638 Anemia in other chronic diseases classified elsewhere: Secondary | ICD-10-CM | POA: Diagnosis not present

## 2015-07-26 DIAGNOSIS — M7989 Other specified soft tissue disorders: Secondary | ICD-10-CM | POA: Diagnosis not present

## 2015-07-26 DIAGNOSIS — Z9889 Other specified postprocedural states: Secondary | ICD-10-CM | POA: Diagnosis not present

## 2015-07-26 DIAGNOSIS — Y9289 Other specified places as the place of occurrence of the external cause: Secondary | ICD-10-CM | POA: Diagnosis not present

## 2015-07-26 DIAGNOSIS — M6281 Muscle weakness (generalized): Secondary | ICD-10-CM | POA: Diagnosis not present

## 2015-07-26 DIAGNOSIS — Z8619 Personal history of other infectious and parasitic diseases: Secondary | ICD-10-CM | POA: Diagnosis not present

## 2015-07-26 DIAGNOSIS — F419 Anxiety disorder, unspecified: Secondary | ICD-10-CM | POA: Diagnosis not present

## 2015-07-26 DIAGNOSIS — M810 Age-related osteoporosis without current pathological fracture: Secondary | ICD-10-CM | POA: Diagnosis not present

## 2015-07-26 DIAGNOSIS — Z9181 History of falling: Secondary | ICD-10-CM | POA: Diagnosis not present

## 2015-07-26 DIAGNOSIS — K219 Gastro-esophageal reflux disease without esophagitis: Secondary | ICD-10-CM | POA: Diagnosis not present

## 2015-07-26 DIAGNOSIS — Y9389 Activity, other specified: Secondary | ICD-10-CM | POA: Diagnosis not present

## 2015-07-26 DIAGNOSIS — I10 Essential (primary) hypertension: Secondary | ICD-10-CM | POA: Diagnosis not present

## 2015-07-26 DIAGNOSIS — R110 Nausea: Secondary | ICD-10-CM | POA: Diagnosis not present

## 2015-07-26 DIAGNOSIS — S8992XA Unspecified injury of left lower leg, initial encounter: Secondary | ICD-10-CM | POA: Diagnosis present

## 2015-07-26 DIAGNOSIS — I824Z2 Acute embolism and thrombosis of unspecified deep veins of left distal lower extremity: Secondary | ICD-10-CM | POA: Diagnosis not present

## 2015-07-26 DIAGNOSIS — E039 Hypothyroidism, unspecified: Secondary | ICD-10-CM | POA: Diagnosis not present

## 2015-07-26 DIAGNOSIS — E785 Hyperlipidemia, unspecified: Secondary | ICD-10-CM | POA: Diagnosis not present

## 2015-07-26 DIAGNOSIS — M069 Rheumatoid arthritis, unspecified: Secondary | ICD-10-CM | POA: Diagnosis not present

## 2015-07-26 DIAGNOSIS — Z87448 Personal history of other diseases of urinary system: Secondary | ICD-10-CM | POA: Diagnosis not present

## 2015-07-26 DIAGNOSIS — R278 Other lack of coordination: Secondary | ICD-10-CM | POA: Diagnosis not present

## 2015-07-26 DIAGNOSIS — Z7982 Long term (current) use of aspirin: Secondary | ICD-10-CM | POA: Diagnosis not present

## 2015-07-26 DIAGNOSIS — Z8701 Personal history of pneumonia (recurrent): Secondary | ICD-10-CM | POA: Diagnosis not present

## 2015-07-26 DIAGNOSIS — I744 Embolism and thrombosis of arteries of extremities, unspecified: Secondary | ICD-10-CM | POA: Diagnosis not present

## 2015-07-26 DIAGNOSIS — D72829 Elevated white blood cell count, unspecified: Secondary | ICD-10-CM | POA: Diagnosis not present

## 2015-07-26 DIAGNOSIS — T148 Other injury of unspecified body region: Secondary | ICD-10-CM | POA: Diagnosis not present

## 2015-07-26 DIAGNOSIS — Z8673 Personal history of transient ischemic attack (TIA), and cerebral infarction without residual deficits: Secondary | ICD-10-CM | POA: Diagnosis not present

## 2015-07-26 DIAGNOSIS — N390 Urinary tract infection, site not specified: Secondary | ICD-10-CM | POA: Diagnosis not present

## 2015-07-26 DIAGNOSIS — I82402 Acute embolism and thrombosis of unspecified deep veins of left lower extremity: Secondary | ICD-10-CM | POA: Diagnosis not present

## 2015-07-26 DIAGNOSIS — D508 Other iron deficiency anemias: Secondary | ICD-10-CM | POA: Diagnosis not present

## 2015-07-26 DIAGNOSIS — Z7952 Long term (current) use of systemic steroids: Secondary | ICD-10-CM | POA: Diagnosis not present

## 2015-07-26 DIAGNOSIS — A419 Sepsis, unspecified organism: Secondary | ICD-10-CM | POA: Insufficient documentation

## 2015-07-26 DIAGNOSIS — I5032 Chronic diastolic (congestive) heart failure: Secondary | ICD-10-CM | POA: Diagnosis not present

## 2015-07-26 DIAGNOSIS — B9620 Unspecified Escherichia coli [E. coli] as the cause of diseases classified elsewhere: Secondary | ICD-10-CM | POA: Diagnosis not present

## 2015-07-26 DIAGNOSIS — I2510 Atherosclerotic heart disease of native coronary artery without angina pectoris: Secondary | ICD-10-CM | POA: Diagnosis not present

## 2015-07-26 LAB — CBC
HEMATOCRIT: 32 % — AB (ref 36.0–46.0)
Hemoglobin: 10.4 g/dL — ABNORMAL LOW (ref 12.0–15.0)
MCH: 28.6 pg (ref 26.0–34.0)
MCHC: 32.5 g/dL (ref 30.0–36.0)
MCV: 87.9 fL (ref 78.0–100.0)
PLATELETS: 131 10*3/uL — AB (ref 150–400)
RBC: 3.64 MIL/uL — AB (ref 3.87–5.11)
RDW: 15 % (ref 11.5–15.5)
WBC: 13.4 10*3/uL — AB (ref 4.0–10.5)

## 2015-07-26 LAB — BASIC METABOLIC PANEL
ANION GAP: 12 (ref 5–15)
BUN: 12 mg/dL (ref 6–20)
CO2: 19 mmol/L — ABNORMAL LOW (ref 22–32)
Calcium: 8.1 mg/dL — ABNORMAL LOW (ref 8.9–10.3)
Chloride: 108 mmol/L (ref 101–111)
Creatinine, Ser: 1.38 mg/dL — ABNORMAL HIGH (ref 0.44–1.00)
GFR calc Af Amer: 40 mL/min — ABNORMAL LOW (ref >60)
GFR, EST NON AFRICAN AMERICAN: 34 mL/min — AB (ref >60)
GLUCOSE: 87 mg/dL (ref 65–99)
POTASSIUM: 3.3 mmol/L — AB (ref 3.5–5.1)
Sodium: 139 mmol/L (ref 135–145)

## 2015-07-26 MED ORDER — POTASSIUM CHLORIDE CRYS ER 10 MEQ PO TBCR
40.0000 meq | EXTENDED_RELEASE_TABLET | Freq: Once | ORAL | Status: AC
  Administered 2015-07-26: 40 meq via ORAL
  Filled 2015-07-26: qty 4

## 2015-07-26 MED ORDER — LORAZEPAM 0.5 MG PO TABS: ORAL_TABLET | ORAL | Status: DC

## 2015-07-26 MED ORDER — CEFUROXIME AXETIL 500 MG PO TABS: 500.0000 mg | ORAL_TABLET | Freq: Two times a day (BID) | ORAL | Status: DC

## 2015-07-26 MED ORDER — ONDANSETRON HCL 4 MG PO TABS: 4.0000 mg | ORAL_TABLET | Freq: Three times a day (TID) | ORAL | Status: DC

## 2015-07-26 MED ORDER — HYDROCODONE-ACETAMINOPHEN 5-325 MG PO TABS: ORAL_TABLET | ORAL | Status: DC

## 2015-07-26 MED ORDER — ONDANSETRON HCL 4 MG PO TABS
4.0000 mg | ORAL_TABLET | Freq: Once | ORAL | Status: AC
  Administered 2015-07-26: 4 mg via ORAL
  Filled 2015-07-26: qty 1

## 2015-07-26 NOTE — NC FL2 (Signed)
[place] MEDICAID FL2 LEVEL OF CARE SCREENING TOOL     IDENTIFICATION  Patient Name: [name] Birthdate: [birth year] Sex: female Admission Date (Current Location): 07/22/2015  [facility] and [place] Number:  Herbalist and Address:  [facility] [place], [place] [address] [place], [place] [address]      Provider Number: [id number]  Attending Physician Name and Address:  Verlee Monte, MD  Relative Name and Phone Number:  [name], son, [phone]    Current Level of Care: Hospital Recommended Level of Care: [place] Prior Approval Number:    Date Approved/Denied:   PASRR Number:    Discharge Plan: SNF    Current Diagnoses: Patient Active Problem List   Diagnosis Date Noted  . Escherichia coli sepsis ([place]) 07/23/2015  . Acute respiratory failure with hypoxia ([place]) 07/22/2015  . Metabolic encephalopathy [id number]  . Obstructive cardiomyopathy ([place])   . Pulmonary hypertension ([place])   . Osteopenia 04/11/2015  . Generalized osteoarthritis 04/11/2015  . Anemia, iron deficiency 04/11/2015  . CKD (chronic kidney disease), stage III 04/11/2015  . Thyroid activity decreased 04/11/2015  . Coronary artery disease   . Obstructive hypertrophic cardiomyopathy ([place])   . Chronic diastolic CHF (congestive heart failure) ([place])   . Hypoxia 10/29/2014  . Sepsis due to urinary tract infection ([place]) 10/29/2014  . Vertigo 05/17/2014  . Hemorrhoids 04/24/2014  . Cerumen impaction 01/09/2014  . Hypothyroidism 12/16/2013  . Leukocytosis, unspecified 12/16/2013  . HLD (hyperlipidemia) 12/16/2013  . Essential hypertension 12/16/2013  . Exertional dyspnea 12/16/2013  . Allergic rhinitis 07/05/2013  . Recurrent colitis due to Clostridium difficile 06/02/2012  . Adrenal insufficiency ([place]) 12/08/2011    Class: Chronic  . History of CVA (cerebrovascular accident) 12/07/2011    Class: Acute  . Dysphagia 12/07/2011    Class: Acute  . MI  (myocardial infarction) ([place])   . Arthritis   . RA (rheumatoid arthritis) ([place])   . PUD (peptic ulcer disease)   . GERD (gastroesophageal reflux disease)   . Hiatal hernia   . Osteoporosis   . Depression   . Anxiety     Orientation RESPIRATION BLADDER Height & Weight    Self, Place  O2 (Nasal cannula 1L) Incontinent 5\' 4"  (162.6 cm) 154 lbs.  BEHAVIORAL SYMPTOMS/MOOD NEUROLOGICAL BOWEL NUTRITION STATUS      Incontinent  (Please see DC summary)  AMBULATORY STATUS COMMUNICATION OF NEEDS Skin   Extensive Assist Verbally Normal                       Personal Care Assistance Level of Assistance  Bathing, Feeding, Dressing Bathing Assistance: Limited assistance Feeding assistance: Independent Dressing Assistance: Limited assistance     Functional Limitations Info             SPECIAL CARE FACTORS FREQUENCY  PT (By licensed PT)     PT Frequency: min 2x/week              Contractures      Additional Factors Info  Code Status, Allergies, Psychotropic Code Status Info: Full Allergies Info: Biphosphate, Codeine, Morphine And Related, Percocet, Plavix, Sulfur Psychotropic Info: Zoloft         Current Medications (07/26/2015):  This is the current hospital active medication list Current Facility-Administered Medications  Medication Dose Route Frequency Provider Last Rate Last Dose  . acetaminophen (TYLENOL) tablet 650 mg  650 mg Oral Q6H PRN Samella Parr, NP   650 mg at 07/24/15 1520  Or  . acetaminophen (TYLENOL) suppository 650 mg  650 mg Rectal Q6H PRN Samella Parr, NP   650 mg at 07/23/15 0945  . ceFAZolin (ANCEF) IVPB 2 g/50 mL premix  2 g Intravenous Q12H Lauren D Bajbus, RPH   2 g at 07/26/15 0129  . ferrous sulfate tablet 325 mg  325 mg Oral TID WC Lavina Hamman, MD   325 mg at 07/26/15 0529  . hydrALAZINE (APRESOLINE) injection 10 mg  10 mg Intravenous Q4H PRN Jeryl Columbia, NP   10 mg at 07/25/15 0631  . hydrALAZINE (APRESOLINE) tablet 50  mg  50 mg Oral 3 times per day Ripudeep Krystal Eaton, MD   50 mg at 07/26/15 0529  . ipratropium-albuterol (DUONEB) 0.5-2.5 (3) MG/3ML nebulizer solution 3 mL  3 mL Nebulization Q6H PRN Samella Parr, NP      . levothyroxine (SYNTHROID, LEVOTHROID) tablet 50 mcg  50 mcg Oral QAC breakfast Lavina Hamman, MD   50 mcg at 07/26/15 0529  . methocarbamol (ROBAXIN) tablet 500 mg  500 mg Oral QHS PRN Lavina Hamman, MD   500 mg at 07/26/15 0148  . metoprolol tartrate (LOPRESSOR) tablet 25 mg  25 mg Oral BID Lavina Hamman, MD   25 mg at 07/25/15 2058  . montelukast (SINGULAIR) tablet 10 mg  10 mg Oral QHS Lavina Hamman, MD   10 mg at 07/25/15 2058  . ondansetron (ZOFRAN) injection 4 mg  4 mg Intravenous Q6H PRN Samella Parr, NP   4 mg at 07/26/15 0737  . pantoprazole (PROTONIX) EC tablet 40 mg  40 mg Oral Daily Lavina Hamman, MD   Stopped at 07/25/15 1000  . predniSONE (DELTASONE) tablet 10 mg  10 mg Oral Daily Lavina Hamman, MD   Stopped at 07/25/15 1000  . rosuvastatin (CRESTOR) tablet 20 mg  20 mg Oral QPM Lavina Hamman, MD   20 mg at 07/25/15 1736  . saccharomyces boulardii (FLORASTOR) capsule 250 mg  250 mg Oral BID Lavina Hamman, MD   250 mg at 07/25/15 2058  . sertraline (ZOLOFT) tablet 50 mg  50 mg Oral q morning - 10a Lavina Hamman, MD   50 mg at 07/24/15 1051  . sodium chloride 0.9 % injection 3 mL  3 mL Intravenous Q12H Samella Parr, NP   3 mL at 07/25/15 2200     Discharge Medications: Please see discharge summary for a list of discharge medications.  Relevant Imaging Results:  Relevant Lab Results:   Additional Dallas Wesson Stith, LCSWA

## 2015-07-26 NOTE — Care Management Note (Signed)
Case Management Note  Patient Details  Name: [name] MRN: [id number] Date of Birth: 06-11-1930  Subjective/Objective:                 Patient admitted from [facility] with sepsis. Will DC to SNF today as facilitated through [place].    Action/Plan:   Expected Discharge Date:                  Expected Discharge Plan:  Skilled Nursing Facility  In-House Referral:  Clinical Social Work  Discharge planning Services  CM Consult  Post Acute Care Choice:    Choice offered to:     DME Arranged:    DME Agency:     HH Arranged:    [place] Agency:     Status of Service:  Completed, signed off  Medicare Important Message Given:  Yes Date Medicare IM Given:    Medicare IM give by:    Date Additional Medicare IM Given:    Additional Medicare Important Message give by:     If discussed at [place] of Stay Meetings, dates discussed:    Additional Comments:  Carles Collet, RN 07/26/2015, 11:42 AM

## 2015-07-26 NOTE — Discharge Summary (Signed)
Physician Discharge Summary  Howard Patton [id number] DOB: 22-Apr-1930 DOA: 07/22/2015  PCP: [name], MD  Admit date: 07/22/2015 Discharge date: 07/26/2015  Time spent: 40 minutes  Recommendations for Outpatient Follow-up:  1. Follow up with nursing home M.D. within 1 week. 2. Please schedule Zofran for at least 1 week because of nausea. 3. Continue Ceftin 500 mg twice a day for 10 more days.   Discharge Diagnoses:  Principal Problem:   Escherichia coli sepsis (HCC) Active Problems:   RA (rheumatoid arthritis) ([place])   History of CVA (cerebrovascular accident)   Dysphagia   Hypothyroidism   HLD (hyperlipidemia)   Essential hypertension   Coronary artery disease   Obstructive hypertrophic cardiomyopathy (HCC)   Chronic diastolic CHF (congestive heart failure) (HCC)   Anemia, iron deficiency   CKD (chronic kidney disease), stage III   Metabolic encephalopathy   Discharge Condition: Stable  Diet recommendation: Heart healthy, meds whole in pure, no straws  Filed Weights   07/23/15 0232 07/24/15 0614 07/25/15 0415  Weight: 68.4 kg (150 lb 12.7 oz) 69.3 kg (152 lb 12.5 oz) 70.1 kg (154 lb 8.7 oz)    History of present illness:  This is an 8 female patient resident of [name] facility since May 2016 who presented to the hospital report of fever. Patient has a past medical history of old CVA and resultant dysphagia, CAD, hypothyroidism, rheumatoid arthritis on chronic steroids, hypertension and associated HOCM with chronic diastolic heart failure, dyslipidemia, anemia and chronic kidney disease. Per nursing home records patient developed nausea and vomiting without abdominal pain or diarrhea on 1/10; this was associated with fever. Urinalysis had been obtained and was negative for UTI. Follow-up evaluation on 1/12 and a straight resolution of vomiting the patient was having coughing with wheezing and concerns were for possible aspiration so a chest  x-ray was obtained that did not reveal evidence of pneumonia or other focal infiltrate. According to family at bedside and patient apparently had some improvement although for the past 1 week she has really not eaten much. The past 2 days patient has developed progressive confusion and recurrence of her nausea and vomiting and much higher fevers now up to 102.2 at the facility. During initial episodic nausea and again have been tried but was too sedating therefore patient was transitioned and Zofran but remained nauseated. Of note recently patient has had change in urine to being more malodorous and dark.  Hospital Course:   Escherichia coli sepsis/bacteremia secondary to urinary tract infection - Patient met sepsis criteria with leukocytosis, fevers, UTI, tachycardia - Blood cultures growing Escherichia coli. - Repeat blood cultures so far negative - Pro-calcitonin 38.82, WBC count 17.5 today - Discharged on Ceftin to complete a total of 2 weeks of antibiotics.  Bacteremia and UTI -Patient urine and blood culture both grew Escherichia coli. -Has recurrent UTIs, advised to quit change of her Depend underwear, already on cranberry supplements.  Nausea -Likely this is secondary to the UTI, bacteremia and sepsis. -Continue to have nausea but no vomiting, her daughter at bedside she eats if someone supervise her. -I have asked the nursing home to schedule the Zofran at least for 1 week, until her appetite picks up.  Dysphagia. -Patient was having difficulty swallowing in the ER. Has dysphagia at her baseline.  - Speech therapy consulted, recommended continue regular diet  GERD. Continuing PPI.  Hypothyroidism. Continuing Synthroid.  Chronic steroid therapy for her rheumatoid arthritis Blood pressure stable to resume prednisone home dose.  Hypertension uncontrolled Continuing Lopressor 25 mg twice a day, increase hydralazine to '50mg'$  TID.  Acute kidney injury on chronic kidney  disease stage III - Patient presented with creatinine function of 1.78, improving to 1.5 today  Chronic diastolic dysfunction. Appears euvolemic at present.  Iron deficiency anemia. Continuing supplements. Stable.  Thrombocytopenia. Most likely in the setting of infection as well as dilution due to fluids. Continue SCDs   Procedures:  None  Consultations:  None  Discharge Exam: Filed Vitals:   07/25/15 2151 07/26/15 1041  BP: 160/66 178/85  Pulse: 86 103  Temp: 97.9 F (36.6 C) 99.4 F (37.4 C)  Resp: 20 18   General: Alert and awake, oriented x3, not in any acute distress. HEENT: anicteric sclera, pupils reactive to light and accommodation, EOMI CVS: S1-S2 clear, no murmur rubs or gallops Chest: clear to auscultation bilaterally, no wheezing, rales or rhonchi Abdomen: soft nontender, nondistended, normal bowel sounds, no organomegaly Extremities: no cyanosis, clubbing or edema noted bilaterally Neuro: Cranial nerves II-XII intact, no focal neurological deficits  Discharge Instructions   Discharge Instructions    Diet - low sodium heart healthy    Complete by:  As directed      Increase activity slowly    Complete by:  As directed           Current Discharge Medication List    START taking these medications   Details  cefUROXime (CEFTIN) 500 MG tablet Take 1 tablet (500 mg total) by mouth 2 (two) times daily with a meal.      CONTINUE these medications which have CHANGED   Details  HYDROcodone-acetaminophen (NORCO/VICODIN) 5-325 MG tablet Take one tablet by mouth twice daily for pain. Take one tablet by mouth every 8 hours as needed for pain. Hold for sedation. Do not exceed 4gm of Tylenol in 24 hours Qty: 10 tablet, Refills: 0    LORazepam (ATIVAN) 0.5 MG tablet Take one tablet by mouth every night at bedtime for anxiety Qty: 10 tablet, Refills: 5    ondansetron (ZOFRAN) 4 MG tablet Take 1 tablet (4 mg total) by mouth 4 (four) times daily -  before  meals and at bedtime. Take one tablet by mouth every 6 hours as needed for nausea/vomiting Qty: 20 tablet, Refills: 0      CONTINUE these medications which have NOT CHANGED   Details  Artificial Saliva (ACT DRY MOUTH) LOZG Use as directed in the mouth or throat as needed (for dry mouth).    aspirin 81 MG chewable tablet Chew 81 mg by mouth daily.    calcium-vitamin D (OSCAL WITH D) 500-200 MG-UNIT per tablet Take 1 tablet by mouth 3 (three) times daily.    cetirizine (ZYRTEC) 10 MG tablet Take 10 mg by mouth daily.    Cholecalciferol (VITAMIN D) 2000 UNITS CAPS Take 1 capsule (2,000 Units total) by mouth daily. Qty: 30 capsule, Refills: 11   Associated Diagnoses: RA (rheumatoid arthritis) (HCC)    Cranberry 200 MG CAPS Take 2 capsules by mouth 2 (two) times daily.    esomeprazole (NEXIUM) 40 MG capsule Take 40 mg by mouth 2 (two) times daily before a meal. Take one capsule by mouth twice daily for stomach    ferrous sulfate 325 (65 FE) MG tablet Take 325 mg by mouth 3 (three) times daily with meals.    ipratropium (ATROVENT) 0.03 % nasal spray Place 2 sprays into both nostrils daily as needed (For asthma).     ipratropium-albuterol (DUONEB) 0.5-2.5 (3)  MG/3ML SOLN Take 3 mLs by nebulization every 8 (eight) hours as needed. For shortness of breath    levothyroxine (SYNTHROID, LEVOTHROID) 50 MCG tablet Take 50 mcg by mouth daily.     lidocaine (LIDODERM) 5 % Place 1 patch onto the skin daily. Remove & Discard patch within 12 hours or as directed by MD Qty: 30 patch, Refills: 0    methocarbamol (ROBAXIN) 500 MG tablet Take 500 mg by mouth at bedtime as needed for muscle spasms.     metoprolol tartrate (LOPRESSOR) 25 MG tablet Take 25 mg by mouth 2 (two) times daily.    montelukast (SINGULAIR) 10 MG tablet Take 10 mg by mouth at bedtime.    Multiple Vitamin (MULTIVITAMIN) tablet Take 1 tablet by mouth daily.      Omega-3 Fatty Acids (FISH OIL) 1200 MG CAPS Take 1,200 mg by mouth  daily.    PATADAY 0.2 % SOLN Place 1 drop into both eyes daily.     predniSONE (DELTASONE) 10 MG tablet Take 1 tablet by mouth daily.    PROLIA 60 MG/ML SOLN injection Inject 60 mLs into the skin every 6 (six) months.     promethazine (PHENERGAN) 25 MG tablet Take 25 mg by mouth 2 (two) times daily as needed for nausea.     rosuvastatin (CRESTOR) 20 MG tablet Take 20 mg by mouth every evening.     saccharomyces boulardii (FLORASTOR) 250 MG capsule Take 250 mg by mouth 2 (two) times daily.     sertraline (ZOLOFT) 50 MG tablet Take 50 mg by mouth every morning.       Allergies  Allergen Reactions  . Biphosphate   . Codeine Swelling  . Morphine And Related Other (See Comments)    unknown  . Percocet [Oxycodone-Acetaminophen]     unknown  . Plavix [Clopidogrel Bisulfate] Other (See Comments)    RASH  . Sulfur Other (See Comments)    GI upset   Follow-up Information    Follow up with [name], MD In 1 week.   Specialty:  Internal Medicine   Contact information:   [place] [address] [phone]        The results of significant diagnostics from this hospitalization (including imaging, microbiology, ancillary and laboratory) are listed below for reference.    Significant Diagnostic Studies: Dg Chest 1 View  06/28/2015  CLINICAL DATA:  Follow-up pneumonia. EXAM: CHEST 1 VIEW COMPARISON:  Chest radiograph 11/02/2014 FINDINGS: Stable cardiac and mediastinal contours. Large hiatal hernia. Significant interval improvement in previously described airspace opacities with minimal residual heterogeneous opacity left lung base. No pleural effusion or pneumothorax. There appears to be interval fracture of the right Harrington rod. IMPRESSION: Near complete resolution of previously described bilateral airspace opacities. Minimal residual atelectasis left lung base. Large hiatal hernia. Interval fracture of the mid aspect of the right Harrington rod.  Electronically Signed   By: Lovey Newcomer M.D.   On: 06/28/2015 16:42   Dg Chest 2 View  07/22/2015  CLINICAL DATA:  Fever and hypoxia EXAM: CHEST - 2 VIEW COMPARISON:  06/28/2015 FINDINGS: Cardiac shadow is stable. A hiatal hernia is again seen. Fixation hardware is again noted within the thoracolumbar spine. The previously noted fracture of the right Harrington rod is again visualized. No acute bony abnormality is seen. No focal infiltrate is noted. IMPRESSION: No acute abnormality seen. Electronically Signed   By: Inez Catalina M.D.   On: 07/22/2015 13:27   Dg Chest [facility] 1 [address]  07/23/2015  CLINICAL DATA:  Sepsis EXAM: PORTABLE CHEST 1 VIEW COMPARISON:  July 22, 2015 FINDINGS: There is slight left base atelectasis. Lungs elsewhere clear. Heart is upper normal in size with pulmonary vascularity within normal limits. Hiatal hernia present. Rod fixation in the thoracic and lumbar regions is stable. There is atherosclerotic calcification in the aorta. There is evidence of old trauma involving the proximal right humerus. IMPRESSION: Slight left base atelectasis. No edema or consolidation. No change in cardiac silhouette. Hiatal hernia present. Electronically Signed   By: Lowella Grip III M.D.   On: 07/23/2015 07:04    Microbiology: Recent Results (from the past 240 hour(s))  Urine culture     Status: None   Collection Time: 07/22/15 11:10 AM  Result Value Ref Range Status   Specimen Description URINE, CATHETERIZED  Final   Special Requests NONE  Final   Culture >=100,000 COLONIES/mL ESCHERICHIA COLI  Final   Report Status 07/24/2015 FINAL  Final   Organism ID, Bacteria ESCHERICHIA COLI  Final      Susceptibility   Escherichia coli - MIC*    AMPICILLIN >=32 RESISTANT Resistant     CEFAZOLIN <=4 SENSITIVE Sensitive     CEFTRIAXONE <=1 SENSITIVE Sensitive     CIPROFLOXACIN <=0.25 SENSITIVE Sensitive     GENTAMICIN 2 SENSITIVE Sensitive     IMIPENEM <=0.25 SENSITIVE Sensitive      NITROFURANTOIN <=16 SENSITIVE Sensitive     TRIMETH/SULFA <=20 SENSITIVE Sensitive     AMPICILLIN/SULBACTAM >=32 RESISTANT Resistant     PIP/TAZO <=4 SENSITIVE Sensitive     * >=100,000 COLONIES/mL ESCHERICHIA COLI  Culture, blood (routine x 2)     Status: None   Collection Time: 07/22/15 11:23 AM  Result Value Ref Range Status   Specimen Description BLOOD RIGHT ANTECUBITAL  Final   Special Requests BOTTLES DRAWN AEROBIC AND ANAEROBIC 5ML  Final   Culture  Setup Time   Final    GRAM NEGATIVE RODS IN BOTH AEROBIC AND ANAEROBIC BOTTLES CRITICAL RESULT CALLED TO, READ BACK BY AND VERIFIED WITH: Dolan Amen RN [phone] GREEN R CONFIRMED BY [place]    Culture   Final    ESCHERICHIA COLI SUSCEPTIBILITIES PERFORMED ON PREVIOUS CULTURE WITHIN THE LAST 5 DAYS.    Report Status 07/25/2015 FINAL  Final  Respiratory virus panel     Status: None   Collection Time: 07/22/15  2:10 PM  Result Value Ref Range Status   Respiratory Syncytial Virus A Negative Negative Final   Respiratory Syncytial Virus B Negative Negative Final   Influenza A Negative Negative Final   Influenza B Negative Negative Final   Parainfluenza 1 Negative Negative Final   Parainfluenza 2 Negative Negative Final   Parainfluenza 3 Negative Negative Final   Metapneumovirus Negative Negative Final   Rhinovirus Negative Negative Final   Adenovirus Negative Negative Final    Comment: (NOTE) Performed At: [facility] [address] [place], [place] [id number] Lindon Romp MD IR:[id number]   MRSA PCR Screening     Status: None   Collection Time: 07/22/15  6:22 PM  Result Value Ref Range Status   MRSA by PCR NEGATIVE NEGATIVE Final    Comment:        The GeneXpert MRSA Assay (FDA approved for NASAL specimens only), is one component of a comprehensive MRSA colonization surveillance program. It is not intended to diagnose MRSA infection nor to guide or monitor treatment for MRSA infections.    Culture, blood (routine x 2)  Status: None   Collection Time: 07/22/15  7:05 PM  Result Value Ref Range Status   Specimen Description BLOOD LEFT ANTECUBITAL  Final   Special Requests IN PEDIATRIC BOTTLE 1CC  Final   Culture  Setup Time   Final    GRAM NEGATIVE RODS PEDIATRIC BOTTLE CRITICAL RESULT CALLED TO, READ BACK BY AND VERIFIED WITH: T. HOOD,RN AT [id number] ON [id number] BY S. YARBROUGH    Culture ESCHERICHIA COLI  Final   Report Status 07/25/2015 FINAL  Final   Organism ID, Bacteria ESCHERICHIA COLI  Final      Susceptibility   Escherichia coli - MIC*    AMPICILLIN >=32 RESISTANT Resistant     CEFAZOLIN <=4 SENSITIVE Sensitive     CEFEPIME <=1 SENSITIVE Sensitive     CEFTAZIDIME <=1 SENSITIVE Sensitive     CEFTRIAXONE <=1 SENSITIVE Sensitive     CIPROFLOXACIN <=0.25 SENSITIVE Sensitive     GENTAMICIN <=1 SENSITIVE Sensitive     IMIPENEM <=0.25 SENSITIVE Sensitive     TRIMETH/SULFA <=20 SENSITIVE Sensitive     AMPICILLIN/SULBACTAM 16 INTERMEDIATE Intermediate     PIP/TAZO <=4 SENSITIVE Sensitive     * ESCHERICHIA COLI  Culture, blood (routine x 2)     Status: None (Preliminary result)   Collection Time: 07/23/15 12:22 PM  Result Value Ref Range Status   Specimen Description BLOOD LEFT WRIST  Final   Special Requests BOTTLES DRAWN AEROBIC AND ANAEROBIC 5 CC  Final   Culture NO GROWTH 2 DAYS  Final   Report Status PENDING  Incomplete  Culture, blood (routine x 2)     Status: None (Preliminary result)   Collection Time: 07/23/15 12:27 PM  Result Value Ref Range Status   Specimen Description BLOOD LEFT HAND  Final   Special Requests IN PEDIATRIC BOTTLE 2 CC  Final   Culture NO GROWTH 2 DAYS  Final   Report Status PENDING  Incomplete     Labs: Basic Metabolic Panel:  Recent Labs Lab 07/22/15 1123 07/23/15 0226 07/24/15 1122 07/25/15 0706 07/26/15 0655  NA 144 145 143 141 139  K 3.5 3.8 3.0* 3.4* 3.3*  CL 101 112* 107 109 108  CO2 26 24 21* 21* 19*  GLUCOSE 147*  87 132* 83 87  BUN 25* 22* '20 18 12  '$ CREATININE 1.78* 1.61* 1.54* 1.52* 1.38*  CALCIUM 11.3* 8.8* 8.5* 8.4* 8.1*  MG  --   --   --  1.4*  --    Liver Function Tests:  Recent Labs Lab 07/22/15 1123 07/23/15 0226  AST 23 29  ALT 15 13*  ALKPHOS 66 46  BILITOT 1.1 0.8  PROT 6.3* 4.9*  ALBUMIN 2.9* 2.1*   No results for input(s): LIPASE, AMYLASE in the last 168 hours. No results for input(s): AMMONIA in the last 168 hours. CBC:  Recent Labs Lab 07/22/15 1123 07/23/15 0226 07/24/15 1122 07/25/15 0706 07/26/15 0655  WBC 27.8* 20.7* 18.1* 17.5* 13.4*  NEUTROABS 24.0*  --   --   --   --   HGB 13.0 9.9* 10.0* 10.5* 10.4*  HCT 39.7 30.9* 31.0* 32.6* 32.0*  MCV 88.6 90.4 88.8 89.6 87.9  PLT 116* 94* 204 128* 131*   Cardiac Enzymes: No results for input(s): CKTOTAL, CKMB, CKMBINDEX, TROPONINI in the last 168 hours. BNP: BNP (last 3 results)  Recent Labs  10/29/14 1027  BNP 1045.3*    ProBNP (last 3 results) No results for input(s): PROBNP in the last 8760 hours.  CBG: No  results for input(s): GLUCAP in the last 168 hours.     Signed:  Birdie Hopes MD.  [facility] 07/26/2015, 11:28 AM

## 2015-07-26 NOTE — Progress Notes (Addendum)
Physical Therapy Treatment Patient Details Name: [name] MRN: [id number] DOB: Nov 21, 1929 Today's Date: 07/26/2015    History of Present Illness Pt adm with ecoli sepsis due to UTI. PMH - CVA, RA, HTN, CHF, CAD    PT Comments    Continue to see to assist pt to require less assist with transfers to decr burden of care.  Follow Up Recommendations  Other (comment);SNF (At same level/status as prior to admission.)     Equipment Recommendations  None recommended by PT    Recommendations for Other Services       Precautions / Restrictions Precautions Precautions: Fall Restrictions Weight Bearing Restrictions: No    Mobility  Bed Mobility Overal bed mobility: Needs Assistance Bed Mobility: Supine to Sit     Supine to sit: Mod assist;HOB elevated     General bed mobility comments: Assist to bring legs off and to elevate trunk.   Transfers Overall transfer level: Needs assistance Equipment used: 2 person hand held assist;Rolling walker (2 wheeled) Transfers: Sit to/from Stand Sit to Stand: Mod assist;+2 physical assistance Stand pivot transfers: Mod assist;+2 physical assistance       General transfer comment: Assist to bring hips up and to pivot to recliner with hand held assist. Pt began sitting before turned all the way to chair and had to have assist to finish pivot. Stood from Psychologist, occupational with rolling walker  Ambulation/Gait                 Stairs            Wheelchair Mobility    Modified Rankin (Stroke Patients Only)       Balance Overall balance assessment: Needs assistance Sitting-balance support: No upper extremity supported;Feet supported Sitting balance-Leahy Scale: Fair Sitting balance - Comments: Sat EOB x 5 minutes   Standing balance support: Bilateral upper extremity supported Standing balance-Leahy Scale: Poor Standing balance comment: walker and mod A to maintain static standing x 30 sec. Verbal cues to maintain standing. Pt  wanting to immediately sit.                    Cognition Arousal/Alertness: Awake/alert Behavior During Therapy: WFL for tasks assessed/performed Overall Cognitive Status: Within Functional Limits for tasks assessed                      Exercises      General Comments        Pertinent Vitals/Pain Pain Assessment: Faces Faces Pain Scale: Hurts a little bit Pain Location: head Pain Descriptors / Indicators: Aching Pain Intervention(s): Limited activity within patient's tolerance;Patient requesting pain meds-RN notified    Home Living                      Prior Function            PT Goals (current goals can now be found in the care plan section) Progress towards PT goals: Progressing toward goals    Frequency  Min 2X/week    PT Plan Current plan remains appropriate    Co-evaluation             End of Session Equipment Utilized During Treatment: Gait belt;Oxygen Activity Tolerance: Patient limited by fatigue Patient left: in chair;with call bell/phone within reach;with chair alarm set     Time: [phone] PT Time Calculation (min) (ACUTE ONLY): 14 min  Charges:  $Wheel Chair Management: 8-22 mins  G Codes:      Charlotte Fidalgo 07/26/2015, 11:07 AM Suanne Marker PT [phone]

## 2015-07-26 NOTE — Progress Notes (Signed)
CSW consulted regarding discharge plan. CSW spoke w/ patient's son [name]. Patient is to discharge back to [facility] by [place].  Samantha Locus Damare Serano LCSWA [phone]

## 2015-07-26 NOTE — Progress Notes (Signed)
Patient will DC to: [name] Anticipated DC date: 07/26/15 Family notified: Son Transport by: PTAR 3:30pm  CSW signing off.  Cedric Fishman, [place] Social Worker [phone]

## 2015-07-26 NOTE — Progress Notes (Signed)
[name] discharged Skilled nursing facility per MD order.  Report called to receiving Samantha Malta, LPN at [facility].     Medication List    TAKE these medications        ACT DRY MOUTH Lozg  Use as directed in the mouth or throat as needed (for dry mouth).     aspirin 81 MG chewable tablet  Chew 81 mg by mouth daily.     calcium-vitamin D 500-200 MG-UNIT tablet  Commonly known as:  OSCAL WITH D  Take 1 tablet by mouth 3 (three) times daily.     cefUROXime 500 MG tablet  Commonly known as:  CEFTIN  Take 1 tablet (500 mg total) by mouth 2 (two) times daily with a meal.     cetirizine 10 MG tablet  Commonly known as:  ZYRTEC  Take 10 mg by mouth daily.     Cranberry 200 MG Caps  Take 2 capsules by mouth 2 (two) times daily.     esomeprazole 40 MG capsule  Commonly known as:  NEXIUM  Take 40 mg by mouth 2 (two) times daily before a meal. Take one capsule by mouth twice daily for stomach     ferrous sulfate 325 (65 FE) MG tablet  Take 325 mg by mouth 3 (three) times daily with meals.     Fish Oil 1200 MG Caps  Take 1,200 mg by mouth daily.     HYDROcodone-acetaminophen 5-325 MG tablet  Commonly known as:  NORCO/VICODIN  Take one tablet by mouth twice daily for pain. Take one tablet by mouth every 8 hours as needed for pain. Hold for sedation. Do not exceed 4gm of Tylenol in 24 hours     ipratropium 0.03 % nasal spray  Commonly known as:  ATROVENT  Place 2 sprays into both nostrils daily as needed (For asthma).     ipratropium-albuterol 0.5-2.5 (3) MG/3ML Soln  Commonly known as:  DUONEB  Take 3 mLs by nebulization every 8 (eight) hours as needed. For shortness of breath     levothyroxine 50 MCG tablet  Commonly known as:  SYNTHROID, LEVOTHROID  Take 50 mcg by mouth daily.     lidocaine 5 %  Commonly known as:  LIDODERM  Place 1 patch onto the skin daily. Remove & Discard patch within 12 hours or as directed by MD     LORazepam 0.5 MG tablet  Commonly known  as:  ATIVAN  Take one tablet by mouth every night at bedtime for anxiety     methocarbamol 500 MG tablet  Commonly known as:  ROBAXIN  Take 500 mg by mouth at bedtime as needed for muscle spasms.     metoprolol tartrate 25 MG tablet  Commonly known as:  LOPRESSOR  Take 25 mg by mouth 2 (two) times daily.     montelukast 10 MG tablet  Commonly known as:  SINGULAIR  Take 10 mg by mouth at bedtime.     multivitamin tablet  Take 1 tablet by mouth daily.     ondansetron 4 MG tablet  Commonly known as:  ZOFRAN  Take 1 tablet (4 mg total) by mouth 4 (four) times daily -  before meals and at bedtime. Take one tablet by mouth every 6 hours as needed for nausea/vomiting     PATADAY 0.2 % Soln  Generic drug:  Olopatadine HCl  Place 1 drop into both eyes daily.     predniSONE 10 MG tablet  Commonly known as:  DELTASONE  Take 1 tablet by mouth daily.     PROLIA 60 MG/ML Soln injection  Generic drug:  denosumab  Inject 60 mLs into the skin every 6 (six) months.     promethazine 25 MG tablet  Commonly known as:  PHENERGAN  Take 25 mg by mouth 2 (two) times daily as needed for nausea.     rosuvastatin 20 MG tablet  Commonly known as:  CRESTOR  Take 20 mg by mouth every evening.     saccharomyces boulardii 250 MG capsule  Commonly known as:  FLORASTOR  Take 250 mg by mouth 2 (two) times daily.     sertraline 50 MG tablet  Commonly known as:  ZOLOFT  Take 50 mg by mouth every morning.     Vitamin D 2000 units Caps  Take 1 capsule (2,000 Units total) by mouth daily.        Patients skin is clean, dry and intact, no evidence of skin break down, scattered bruises to arms and MASD to buttocks.. IV site discontinued and catheter remains intact. Site without signs and symptoms of complications. Dressing and pressure applied.  Patient transported on a stretcher by non emergent EMS,  no distress noted upon discharge.  [name], [name] 07/26/2015 4:49 PM

## 2015-07-27 ENCOUNTER — Telehealth: Payer: Self-pay | Admitting: Internal Medicine

## 2015-07-27 ENCOUNTER — Non-Acute Institutional Stay (SKILLED_NURSING_FACILITY): Payer: Medicare Other | Admitting: Internal Medicine

## 2015-07-27 ENCOUNTER — Encounter: Payer: Self-pay | Admitting: Internal Medicine

## 2015-07-27 DIAGNOSIS — R110 Nausea: Secondary | ICD-10-CM

## 2015-07-27 DIAGNOSIS — D72829 Elevated white blood cell count, unspecified: Secondary | ICD-10-CM

## 2015-07-27 DIAGNOSIS — N183 Chronic kidney disease, stage 3 unspecified: Secondary | ICD-10-CM | POA: Insufficient documentation

## 2015-07-27 DIAGNOSIS — D638 Anemia in other chronic diseases classified elsewhere: Secondary | ICD-10-CM | POA: Insufficient documentation

## 2015-07-27 DIAGNOSIS — M810 Age-related osteoporosis without current pathological fracture: Secondary | ICD-10-CM | POA: Diagnosis not present

## 2015-07-27 DIAGNOSIS — R5381 Other malaise: Secondary | ICD-10-CM | POA: Diagnosis not present

## 2015-07-27 DIAGNOSIS — J45909 Unspecified asthma, uncomplicated: Secondary | ICD-10-CM

## 2015-07-27 DIAGNOSIS — I5032 Chronic diastolic (congestive) heart failure: Secondary | ICD-10-CM | POA: Diagnosis not present

## 2015-07-27 DIAGNOSIS — N390 Urinary tract infection, site not specified: Secondary | ICD-10-CM | POA: Diagnosis not present

## 2015-07-27 DIAGNOSIS — E876 Hypokalemia: Secondary | ICD-10-CM

## 2015-07-27 DIAGNOSIS — I10 Essential (primary) hypertension: Secondary | ICD-10-CM | POA: Diagnosis not present

## 2015-07-27 DIAGNOSIS — E038 Other specified hypothyroidism: Secondary | ICD-10-CM

## 2015-07-27 DIAGNOSIS — B9620 Unspecified Escherichia coli [E. coli] as the cause of diseases classified elsewhere: Secondary | ICD-10-CM

## 2015-07-27 DIAGNOSIS — M05712 Rheumatoid arthritis with rheumatoid factor of left shoulder without organ or systems involvement: Secondary | ICD-10-CM

## 2015-07-27 DIAGNOSIS — K219 Gastro-esophageal reflux disease without esophagitis: Secondary | ICD-10-CM

## 2015-07-27 DIAGNOSIS — R195 Other fecal abnormalities: Secondary | ICD-10-CM

## 2015-07-27 NOTE — Telephone Encounter (Signed)
Possible re-admission to facility  - This is a patient you were seeing at **[facility] fu is needed IF patient was re-admitted to facility upon discharge. Hospital discharge **07/26/15**

## 2015-07-27 NOTE — Progress Notes (Signed)
Patient ID: [name], female   DOB: [birth year], 82 y.o.   MRN: [id number]     [facility]  PCP: [name], MD  Code Status: Full Code   Allergies  Allergen Reactions  . Biphosphate   . Codeine Swelling  . Morphine And Related Other (See Comments)    unknown  . Percocet [Oxycodone-Acetaminophen]     unknown  . Plavix [Clopidogrel Bisulfate] Other (See Comments)    RASH  . Sulfur Other (See Comments)    GI upset    Chief Complaint  Patient presents with  . Re-Admit to SNF    Readmission      HPI:  82 y.o. patient is here for long term care post hospital admission from 07/22/15-07/26/15 with sepsis from e.coli UTI. She was started on antibiotics. She was placed on zofran for nausea. She was seen by SLP for dysphagia and has been cleared for regular consistency diet. She has PMH of CVA, CAD, hypothyroidism, RA, HTN, chronic diastolic CHF, HLD, CKD among others. She is seen in her room today. She feels weak and tired. Denies any concerns this visit.   Review of Systems:  Constitutional: Negative for fever, chills, diaphoresis.  HENT: Negative for headache, congestion, nasal discharge, difficulty swallowing.   Eyes: Negative for blurred vision, double vision and discharge.  Respiratory: Negative for cough, wheezing.  has chronic dyspnea. Cardiovascular: Negative for chest pain, palpitations, leg swelling.  Gastrointestinal: Negative for heartburn, nausea, vomiting, abdominal pain. Had bowel movement this am with loose stool Genitourinary: Negative for dysuria, flank pain.  Musculoskeletal: Negative for back pain, falls. Skin: Negative for itching, rash.  Neurological: Negative for dizziness Psychiatric/Behavioral: Negative for depression   Past Medical History  Diagnosis Date  . Arthritis   . Right ear pain   . Renal insufficiency   . RA (rheumatoid arthritis) ([place])   . PUD (peptic ulcer disease)   . GI bleed 2005  . GERD (gastroesophageal  reflux disease)   . Hiatal hernia   . Hypothyroidism   . Osteoporosis   . Depression   . Anxiety   . Coronary artery disease     a. 2012 s/p NSTEMI/Cath: 95% apical LAD dzs, otw nonobs dzs-->Med Rx;  b. 05/2011 MV: no ischemia, EF 79%, inf infarct- attenuation.  . CVA (cerebral vascular accident) ([place])   . TIA (transient ischemic attack)   . Fall   . DVT of lower extremity (deep venous thrombosis) ([place])   . HCAP (healthcare-associated pneumonia) 01/22/2013  . Enteritis due to Clostridium difficile 01/04/2013  . Sepsis ([place]) 12/19/2012  . Recurrent colitis due to Clostridium difficile 06/02/2012  . Essential hypertension   . Hyperlipidemia   . Fatigue   . Weight loss   . Hemorrhoids   . Malnutrition ([place])   . Dyspnea   . Elevated troponin     a. 10/2014 in setting of sepsis/?HCAP.  Marland Kitchen Chronic diastolic CHF (congestive heart failure) ([place])     a. 10/2014 Echo: EF 60-65%, mild LVH, grade 1 DD, dynamic obstruction @ rest - peak velocity of 271 cm/sec, peak grad of 10mmHg, PASP 36mmHg.  Marland Kitchen Obstructive hypertrophic cardiomyopathy ([place])     a. 10/2014 Echo: EF 60-65%, mild LVH, grade 1 DD, dynamic obstruction @ rest - peak velocity of 271 cm/sec, peak grad of 6mmHg, PASP 25mmHg.   Past Surgical History  Procedure Laterality Date  . Nstemi  06/2010  . Spinal fusion surgery    . Knee surgery    .  Cardiac catheterization      SHOWED RUPTURE PLAQUE IN THE LAD. THE LAD IS NONOBSTRUCTIVE WITH ONLY 30-40% STENOSIS  . Back surgery    . Breast surgery  1964    x3  . Ankle surgery    . Colonoscopy     Social History:   reports that she has never smoked. She has never used smokeless tobacco. She reports that she does not drink alcohol or use illicit drugs.  Family History  Problem Relation Age of Onset  . Kidney disease Mother   . Kidney disease Brother   . Lung cancer Father     Medications:   Medication List       This list is accurate as of: 07/27/15 11:47 AM.  Always use your most  recent med list.               ACT DRY MOUTH Lozg  Use as directed in the mouth or throat as needed (for dry mouth).     aspirin 81 MG chewable tablet  Chew 81 mg by mouth daily.     calcium-vitamin D 500-200 MG-UNIT tablet  Commonly known as:  OSCAL WITH D  Take 1 tablet by mouth 3 (three) times daily.     cefUROXime 500 MG tablet  Commonly known as:  CEFTIN  Take 1 tablet (500 mg total) by mouth 2 (two) times daily with a meal.     CERTAVITE/ANTIOXIDANTS PO  Take by mouth daily.     cetirizine 10 MG tablet  Commonly known as:  ZYRTEC  Take 10 mg by mouth daily.     Cranberry 200 MG Caps  Take 2 capsules by mouth 2 (two) times daily.     esomeprazole 40 MG capsule  Commonly known as:  NEXIUM  Take 40 mg by mouth 2 (two) times daily before a meal. Take one capsule by mouth twice daily for stomach     ferrous sulfate 325 (65 FE) MG tablet  Take 325 mg by mouth 3 (three) times daily with meals.     Fish Oil 1200 MG Caps  Take 1,200 mg by mouth daily.     HYDROcodone-acetaminophen 5-325 MG tablet  Commonly known as:  NORCO/VICODIN  Take one tablet by mouth twice daily for pain. Take one tablet by mouth every 8 hours as needed for pain. Hold for sedation. Do not exceed 4gm of Tylenol in 24 hours     ipratropium 0.03 % nasal spray  Commonly known as:  ATROVENT  Place 2 sprays into both nostrils daily as needed (For asthma).     ipratropium-albuterol 0.5-2.5 (3) MG/3ML Soln  Commonly known as:  DUONEB  Take 3 mLs by nebulization every 8 (eight) hours as needed. For shortness of breath     levothyroxine 50 MCG tablet  Commonly known as:  SYNTHROID, LEVOTHROID  Take 50 mcg by mouth daily.     lidocaine 5 %  Commonly known as:  LIDODERM  Place 1 patch onto the skin daily. Remove & Discard patch within 12 hours or as directed by MD     LORazepam 0.5 MG tablet  Commonly known as:  ATIVAN  Take one tablet by mouth every night at bedtime for anxiety      methocarbamol 500 MG tablet  Commonly known as:  ROBAXIN  Take 500 mg by mouth at bedtime as needed for muscle spasms.     metoprolol tartrate 25 MG tablet  Commonly known as:  LOPRESSOR  Take 25  mg by mouth 2 (two) times daily.     montelukast 10 MG tablet  Commonly known as:  SINGULAIR  Take 10 mg by mouth at bedtime.     ondansetron 4 MG tablet  Commonly known as:  ZOFRAN  Take 1 tablet (4 mg total) by mouth 4 (four) times daily -  before meals and at bedtime. Take one tablet by mouth every 6 hours as needed for nausea/vomiting     PATADAY 0.2 % Soln  Generic drug:  Olopatadine HCl  Place 1 drop into both eyes daily.     predniSONE 10 MG tablet  Commonly known as:  DELTASONE  Take 1 tablet by mouth daily.     PROLIA 60 MG/ML Soln injection  Generic drug:  denosumab  Inject 60 mLs into the skin every 6 (six) months.     promethazine 25 MG tablet  Commonly known as:  PHENERGAN  Take 25 mg by mouth 2 (two) times daily as needed for nausea.     rosuvastatin 20 MG tablet  Commonly known as:  CRESTOR  Take 20 mg by mouth every evening.     saccharomyces boulardii 250 MG capsule  Commonly known as:  FLORASTOR  Take 250 mg by mouth 2 (two) times daily.     sertraline 50 MG tablet  Commonly known as:  ZOLOFT  Take 50 mg by mouth every morning.     Vitamin D 2000 units Caps  Take 1 capsule (2,000 Units total) by mouth daily.         Physical Exam: Filed Vitals:   07/27/15 1141  BP: 120/72  Pulse: 76  Temp: 97.1 F (36.2 C)  TempSrc: Oral  Resp: 20  Height: 5\' 4"  (1.626 m)  Weight: 147 lb (66.679 kg)  SpO2: 99%    General- elderly female, well built, in no acute distress Head- normocephalic, atraumatic Nose- no maxillary or frontal sinus tenderness, no nasal discharge Throat- moist mucus membrane Eyes- PERRLA, EOMI, no pallor, no icterus, no discharge, normal conjunctiva, normal sclera Neck- no cervical lymphadenopathy Cardiovascular- normal s1,s2, no  murmurs, no leg edema Respiratory- bilateral clear to auscultation, no wheeze, no rhonchi, no crackles, no use of accessory muscles Abdomen- bowel sounds present, soft, non tender Musculoskeletal- able to move all 4 extremities, generalized weakness Neurological- no focal deficit, alert and oriented to person, place and time Skin- warm and dry, easy bruising, excoriation and redness to her buttock area Psychiatry- normal mood and affect    Labs reviewed: Basic Metabolic Panel:  Recent Labs  10/29/14 1659  11/02/14 0846 11/03/14 0350  07/24/15 1122 07/25/15 0706 07/26/15 0655  NA 137  < > 140 136  < > 143 141 139  K 4.1  < > 3.4* 3.5  < > 3.0* 3.4* 3.3*  CL 104  < > 109 106  < > 107 109 108  CO2 13*  < > 24 23  < > 21* 21* 19*  GLUCOSE 352*  < > 89 100*  < > 132* 83 87  BUN 21*  < > 32* 28*  < > 20 18 12   CREATININE 1.80*  < > 1.54* 1.70*  < > 1.54* 1.52* 1.38*  CALCIUM 9.1  < > 8.0* 7.9*  < > 8.5* 8.4* 8.1*  MG 1.9  --  1.7 2.0  --   --  1.4*  --   PHOS 4.8*  --   --   --   --   --   --   --   < > =  values in this interval not displayed. Liver Function Tests:  Recent Labs  10/30/14 0414 07/22/15 1123 07/23/15 0226  AST 41 23 29  ALT 25 15 13*  ALKPHOS 48 66 46  BILITOT 0.9 1.1 0.8  PROT 6.7 6.3* 4.9*  ALBUMIN 3.3* 2.9* 2.1*   No results for input(s): LIPASE, AMYLASE in the last 8760 hours. No results for input(s): AMMONIA in the last 8760 hours. CBC:  Recent Labs  10/29/14 1029  06/28/15 1555 07/22/15 1123  07/24/15 1122 07/25/15 0706 07/26/15 0655  WBC 14.1*  < > 13.9* 27.8*  < > 18.1* 17.5* 13.4*  NEUTROABS 11.4*  --  12.5* 24.0*  --   --   --   --   HGB 11.8*  < > 12.4 13.0  < > 10.0* 10.5* 10.4*  HCT 38.2  < > 38.2 39.7  < > 31.0* 32.6* 32.0*  MCV 90.5  < > 87.5 88.6  < > 88.8 89.6 87.9  PLT 110*  < > 173.0 116*  < > 204 128* 131*  < > = values in this interval not displayed. Cardiac Enzymes:  Recent Labs  10/31/14 0500 10/31/14 1056  10/31/14 1714  TROPONINI 0.56* 0.47* 0.38*   BNP: Invalid input(s): POCBNP CBG:  Recent Labs  11/04/14 1641 11/05/14 0747 11/06/14 0751  GLUCAP 210* 148* 112*    Radiological Exams: Dg Chest 2 View  07/22/2015  CLINICAL DATA:  Fever and hypoxia EXAM: CHEST - 2 VIEW COMPARISON:  06/28/2015 FINDINGS: Cardiac shadow is stable. A hiatal hernia is again seen. Fixation hardware is again noted within the thoracolumbar spine. The previously noted fracture of the right Harrington rod is again visualized. No acute bony abnormality is seen. No focal infiltrate is noted. IMPRESSION: No acute abnormality seen. Electronically Signed   By: Inez Catalina M.D.   On: 07/22/2015 13:27   Dg Chest Port 1 View  07/23/2015  CLINICAL DATA:  Sepsis EXAM: PORTABLE CHEST 1 VIEW COMPARISON:  July 22, 2015 FINDINGS: There is slight left base atelectasis. Lungs elsewhere clear. Heart is upper normal in size with pulmonary vascularity within normal limits. Hiatal hernia present. Rod fixation in the thoracic and lumbar regions is stable. There is atherosclerotic calcification in the aorta. There is evidence of old trauma involving the proximal right humerus. IMPRESSION: Slight left base atelectasis. No edema or consolidation. No change in cardiac silhouette. Hiatal hernia present. Electronically Signed   By: Lowella Grip III M.D.   On: 07/23/2015 07:04     Assessment/Plan  Physical deconditioning With recent sepsis and hospitalization. Will have her work with physical therapy and occupational therapy team to help with gait training and muscle strengthening exercises.fall precautions. Skin care. Encourage to be out of bed.   E.coli uti Continue ceftin 500 mg bid until 08/05/15. Hydration to be maintained. Continue cranberry supplement  Leukocytosis Afebrile. On chronic prednisone, check cbc 07/30/15  Loose stool Being on antibiotic could be causing this. Continue florastor for now and monitor for early signs  of c.diff infection and dehydration. Check cbc and cmp 07/30/15  Anemia of chronic disease Continue ferrous sulfate tid and monitor cbc  CKD 3 Monitor bmp  Hypokalemia Monitor bmp  RA Continue lidocaine patch. Continue norco 5-325 mg bid and 1 tab q8h prn breakthrough pain. Continue oscal with D and vitamin d supplement, continue prednsione 10 mg daily chronically.   Anxiety Continue ativan 0.5 mg qhs   Nausea Her uti likely causing some, continue zofran 4 mg qid  x 1 week, then q6h prn  Osteoporosis Continue prolia q 6 month and oscal with vitamin d  gerd Continue nexium for now, stable symptom  HTN Continue lopressor 25 mg bid and monitor BP. Per d/c note recommended hydralazine 50 tid but i do not see this med in her d/c med list. Also of note, patient was not on hydralazine prior to hospital admission. BP stable today. Check bp bid x 1 week. Hold off on starting any other antihypertensives at present.  Asthma Continue singulair and bronchodilator, breathing currently stable  Chronic diastolic chf Continue lopressor. Monitor clinically. Appears euvolemic on exam  Hypothyroidism Continue synthroid Lab Results  Component Value Date   TSH 0.708 07/22/2015     Goals of care: short term rehabilitation   Labs/tests ordered: cbc with diff, cmp 07/30/15  Family/ staff Communication: reviewed care plan with patient and nursing supervisor    [name], MD  [place] [phone] (Monday-Friday 8 am - 5 pm) [phone] (afterhours)

## 2015-07-28 LAB — CULTURE, BLOOD (ROUTINE X 2)
CULTURE: NO GROWTH
CULTURE: NO GROWTH

## 2015-08-08 ENCOUNTER — Non-Acute Institutional Stay (SKILLED_NURSING_FACILITY): Payer: Medicare Other | Admitting: Family

## 2015-08-08 DIAGNOSIS — T148XXA Other injury of unspecified body region, initial encounter: Secondary | ICD-10-CM | POA: Insufficient documentation

## 2015-08-08 DIAGNOSIS — T148 Other injury of unspecified body region: Secondary | ICD-10-CM | POA: Diagnosis not present

## 2015-08-08 NOTE — Progress Notes (Signed)
Patient ID: [name], female   DOB: 06-25-1930, 82 y.o.   MRN: [id number]  Location:  [facility]  Provider:  [name], MD  Code Status: Full Code  Goals of care: Advanced Directive information Advanced Directives 07/22/2015  Does patient have an advance directive? Yes  Type of Advance Directive [place]  Does patient want to make changes to advanced directive? -  Copy of advanced directive(s) in chart? -     Chief Complaint  Patient presents with  . Acute Visit    HPI:  Pt is a 82 y.o. female seen today at [facility] for medical acute medical issues.She is here for long term care post hospital admission from 07/22/15-07/26/15 with sepsis from [facility] UTI she completed antibiotics.She has past medical History of CVA, CAD, hypothyroidism, RA, HTN, chronic diastolic CHF, HLD, CKD among others. She is seen in her room today.She complains of left leg bruise which she does not think it's from the last fall episode since she fell on the right side.She denies pain to left leg, numbness, tingling or itching. Review of Systems  Constitutional: Negative for fever, chills, malaise/fatigue and diaphoresis.  HENT: Negative.   Eyes: Negative.   Respiratory: Negative.   Cardiovascular: Negative.   Gastrointestinal: Negative.   Musculoskeletal: Negative.   Skin: Negative for itching and rash.  Neurological: Negative.   Endo/Heme/Allergies: Does not bruise/bleed easily.  Psychiatric/Behavioral: Negative.     Past Medical History  Diagnosis Date  . Arthritis   . Right ear pain   . Renal insufficiency   . RA (rheumatoid arthritis) ([place])   . PUD (peptic ulcer disease)   . GI bleed 2005  . GERD (gastroesophageal reflux disease)   . Hiatal hernia   . Hypothyroidism   . Osteoporosis   . Depression   . Anxiety   . Coronary artery disease     a. 2012 s/p NSTEMI/Cath: 95% apical LAD dzs, otw nonobs dzs-->Med Rx;   b. 05/2011 MV: no ischemia, EF 79%, inf infarct- attenuation.  . CVA (cerebral vascular accident) ([place])   . TIA (transient ischemic attack)   . Fall   . DVT of lower extremity (deep venous thrombosis) ([place])   . HCAP (healthcare-associated pneumonia) 01/22/2013  . Enteritis due to Clostridium difficile 01/04/2013  . Sepsis ([place]) 12/19/2012  . Recurrent colitis due to Clostridium difficile 06/02/2012  . Essential hypertension   . Hyperlipidemia   . Fatigue   . Weight loss   . Hemorrhoids   . Malnutrition ([place])   . Dyspnea   . Elevated troponin     a. 10/2014 in setting of sepsis/?HCAP.  Marland Kitchen Chronic diastolic CHF (congestive heart failure) ([place])     a. 10/2014 Echo: EF 60-65%, mild LVH, grade 1 DD, dynamic obstruction @ rest - peak velocity of 271 cm/sec, peak grad of 37mmHg, PASP 26mmHg.  Marland Kitchen Obstructive hypertrophic cardiomyopathy ([place])     a. 10/2014 Echo: EF 60-65%, mild LVH, grade 1 DD, dynamic obstruction @ rest - peak velocity of 271 cm/sec, peak grad of 35mmHg, PASP 36mmHg.   Past Surgical History  Procedure Laterality Date  . Nstemi  06/2010  . Spinal fusion surgery    . Knee surgery    . Cardiac catheterization      SHOWED RUPTURE PLAQUE IN THE LAD. THE LAD IS NONOBSTRUCTIVE WITH ONLY 30-40% STENOSIS  . Back surgery    . Breast surgery  1964    x3  .  Ankle surgery    . Colonoscopy      Allergies  Allergen Reactions  . Biphosphate   . Codeine Swelling  . Morphine And Related Other (See Comments)    unknown  . Percocet [Oxycodone-Acetaminophen]     unknown  . Plavix [Clopidogrel Bisulfate] Other (See Comments)    RASH  . Sulfur Other (See Comments)    GI upset      Medication List       This list is accurate as of: 08/08/15  5:58 PM.  Always use your most recent med list.               ACT DRY MOUTH Lozg  Use as directed in the mouth or throat as needed (for dry mouth).     aspirin 81 MG chewable tablet  Chew 81 mg by mouth daily.     calcium-vitamin D  500-200 MG-UNIT tablet  Commonly known as:  OSCAL WITH D  Take 1 tablet by mouth 3 (three) times daily.     CERTAVITE/ANTIOXIDANTS PO  Take by mouth daily.     cetirizine 10 MG tablet  Commonly known as:  ZYRTEC  Take 10 mg by mouth daily.     Cranberry 200 MG Caps  Take 2 capsules by mouth 2 (two) times daily.     esomeprazole 40 MG capsule  Commonly known as:  NEXIUM  Take 40 mg by mouth 2 (two) times daily before a meal. Take one capsule by mouth twice daily for stomach     ferrous sulfate 325 (65 FE) MG tablet  Take 325 mg by mouth 3 (three) times daily with meals.     Fish Oil 1200 MG Caps  Take 1,200 mg by mouth daily.     HYDROcodone-acetaminophen 5-325 MG tablet  Commonly known as:  NORCO/VICODIN  Take one tablet by mouth twice daily for pain. Take one tablet by mouth every 8 hours as needed for pain. Hold for sedation. Do not exceed 4gm of Tylenol in 24 hours     ipratropium 0.03 % nasal spray  Commonly known as:  ATROVENT  Place 2 sprays into both nostrils daily as needed (For asthma).     ipratropium-albuterol 0.5-2.5 (3) MG/3ML Soln  Commonly known as:  DUONEB  Take 3 mLs by nebulization every 8 (eight) hours as needed. For shortness of breath     levothyroxine 50 MCG tablet  Commonly known as:  SYNTHROID, LEVOTHROID  Take 50 mcg by mouth daily.     lidocaine 5 %  Commonly known as:  LIDODERM  Place 1 patch onto the skin daily. Remove & Discard patch within 12 hours or as directed by MD     LORazepam 0.5 MG tablet  Commonly known as:  ATIVAN  Take one tablet by mouth every night at bedtime for anxiety     methocarbamol 500 MG tablet  Commonly known as:  ROBAXIN  Take 500 mg by mouth at bedtime as needed for muscle spasms.     metoprolol tartrate 25 MG tablet  Commonly known as:  LOPRESSOR  Take 25 mg by mouth 2 (two) times daily.     montelukast 10 MG tablet  Commonly known as:  SINGULAIR  Take 10 mg by mouth at bedtime.     ondansetron 4 MG  tablet  Commonly known as:  ZOFRAN  Take 1 tablet (4 mg total) by mouth 4 (four) times daily -  before meals and at bedtime. Take one tablet by  mouth every 6 hours as needed for nausea/vomiting     PATADAY 0.2 % Soln  Generic drug:  Olopatadine HCl  Place 1 drop into both eyes daily.     predniSONE 10 MG tablet  Commonly known as:  DELTASONE  Take 1 tablet by mouth daily.     PROLIA 60 MG/ML Soln injection  Generic drug:  denosumab  Inject 60 mLs into the skin every 6 (six) months.     promethazine 25 MG tablet  Commonly known as:  PHENERGAN  Take 25 mg by mouth 2 (two) times daily as needed for nausea.     rosuvastatin 20 MG tablet  Commonly known as:  CRESTOR  Take 20 mg by mouth every evening.     saccharomyces boulardii 250 MG capsule  Commonly known as:  FLORASTOR  Take 250 mg by mouth 2 (two) times daily.     sertraline 50 MG tablet  Commonly known as:  ZOLOFT  Take 50 mg by mouth every morning.     Vitamin D 2000 units Caps  Take 1 capsule (2,000 Units total) by mouth daily.        Immunization History  Administered Date(s) Administered  . Influenza-Unspecified 05/09/2013, 04/27/2014, 04/17/2015  . PPD Test 12/22/2012, 03/14/2014, 03/08/2015  . Pneumococcal-Unspecified 05/09/2013, 09/09/2013   Pertinent  Health Maintenance Due  Topic Date Due  . INFLUENZA VACCINE  01/29/2016  . DEXA SCAN  Completed  . PNA vac Low Risk Adult  Addressed   Fall Risk  10/04/2014  Falls in the past year? Yes  Number falls in past yr: 2 or more  Injury with Fall? No  Risk Factor Category  High Fall Risk  Risk for fall due to : Impaired balance/gait;Impaired mobility;Medication side effect;History of fall(s)  Follow up Education provided;Falls evaluation completed;Falls prevention discussed    Filed Vitals:   08/08/15 1749  BP: 155/70  Pulse: 81  Temp: 98.8 F (37.1 C)  Resp: 20  Weight: 144 lb 9.6 oz (65.59 kg)  SpO2: 95%   Body mass index is 24.81  kg/(m^2). Physical Exam  Constitutional: She is oriented to person, place, and time. She appears well-developed and well-nourished.  Elderly in no acute distress  HENT:  Head: Normocephalic.  Eyes: EOM are normal. Pupils are equal, round, and reactive to light. Right eye exhibits no discharge. Left eye exhibits no discharge. No scleral icterus.  Neck: Normal range of motion.  Cardiovascular: Normal rate, regular rhythm, normal heart sounds and intact distal pulses.   Pulmonary/Chest: Effort normal and breath sounds normal. No respiratory distress. She has no wheezes. She has no rales.  Abdominal: Soft. Bowel sounds are normal. She exhibits no distension and no mass. There is no tenderness. There is no guarding.  Musculoskeletal: Normal range of motion. She exhibits no tenderness.  Bilateral +1 edema to lower extremities.   Lymphadenopathy:    She has no cervical adenopathy.  Neurological: She is oriented to person, place, and time.  Skin: Skin is warm and dry. No rash noted. No pallor.  Left leg outer side purple bruise noted. Calf Muscle none tender to touch without any swelling noted.   Psychiatric: She has a normal mood and affect.    Labs reviewed:  Recent Labs  10/29/14 1659  11/02/14 0846 11/03/14 0350  07/24/15 1122 07/25/15 0706 07/26/15 0655  NA 137  < > 140 136  < > 143 141 139  K 4.1  < > 3.4* 3.5  < > 3.0* 3.4*  3.3*  CL 104  < > 109 106  < > 107 109 108  CO2 13*  < > 24 23  < > 21* 21* 19*  GLUCOSE 352*  < > 89 100*  < > 132* 83 87  BUN 21*  < > 32* 28*  < > 20 18 12   CREATININE 1.80*  < > 1.54* 1.70*  < > 1.54* 1.52* 1.38*  CALCIUM 9.1  < > 8.0* 7.9*  < > 8.5* 8.4* 8.1*  MG 1.9  --  1.7 2.0  --   --  1.4*  --   PHOS 4.8*  --   --   --   --   --   --   --   < > = values in this interval not displayed.  Recent Labs  10/30/14 0414 07/22/15 1123 07/23/15 0226  AST 41 23 29  ALT 25 15 13*  ALKPHOS 48 66 46  BILITOT 0.9 1.1 0.8  PROT 6.7 6.3* 4.9*  ALBUMIN  3.3* 2.9* 2.1*    Recent Labs  10/29/14 1029  06/28/15 1555 07/22/15 1123  07/24/15 1122 07/25/15 0706 07/26/15 0655  WBC 14.1*  < > 13.9* 27.8*  < > 18.1* 17.5* 13.4*  NEUTROABS 11.4*  --  12.5* 24.0*  --   --   --   --   HGB 11.8*  < > 12.4 13.0  < > 10.0* 10.5* 10.4*  HCT 38.2  < > 38.2 39.7  < > 31.0* 32.6* 32.0*  MCV 90.5  < > 87.5 88.6  < > 88.8 89.6 87.9  PLT 110*  < > 173.0 116*  < > 204 128* 131*  < > = values in this interval not displayed. Lab Results  Component Value Date   TSH 0.708 07/22/2015   No results found for: HGBA1C Lab Results  Component Value Date   CHOL 115 03/28/2015   HDL 47 03/28/2015   LDLCALC 41 03/28/2015   LDLDIRECT 60.7 11/13/2010   TRIG 133 03/28/2015   CHOLHDL 2.9 12/02/2011    Significant Diagnostic Results in last 30 days:  Dg Chest 2 View  07/22/2015  CLINICAL DATA:  Fever and hypoxia EXAM: CHEST - 2 VIEW COMPARISON:  06/28/2015 FINDINGS: Cardiac shadow is stable. A hiatal hernia is again seen. Fixation hardware is again noted within the thoracolumbar spine. The previously noted fracture of the right Harrington rod is again visualized. No acute bony abnormality is seen. No focal infiltrate is noted. IMPRESSION: No acute abnormality seen. Electronically Signed   By: Inez Catalina M.D.   On: 07/22/2015 13:27   Dg Chest Port 1 View  07/23/2015  CLINICAL DATA:  Sepsis EXAM: PORTABLE CHEST 1 VIEW COMPARISON:  July 22, 2015 FINDINGS: There is slight left base atelectasis. Lungs elsewhere clear. Heart is upper normal in size with pulmonary vascularity within normal limits. Hiatal hernia present. Rod fixation in the thoracic and lumbar regions is stable. There is atherosclerotic calcification in the aorta. There is evidence of old trauma involving the proximal right humerus. IMPRESSION: Slight left base atelectasis. No edema or consolidation. No change in cardiac silhouette. Hiatal hernia present. Electronically Signed   By: Lowella Grip III  M.D.   On: 07/23/2015 07:04    Assessment/Plan Bruise Left outer side of leg. Unknown cause. Has had recent fall but fell on the right side. Will hold Asprin X 5 days. Possible from fall negative for tenderness, swelling or feeling warm. Will r/o DVT. Ordering CBC/diff, Venous  Ultrasound. Notify provider if worsening. Monitor for swelling,pain,  feeling warm or hot.   Labs/tests ordered: CBC/diff, left leg Venous Ultrasound.

## 2015-08-10 ENCOUNTER — Emergency Department (HOSPITAL_COMMUNITY)
Admission: EM | Admit: 2015-08-10 | Discharge: 2015-08-10 | Disposition: A | Discharge status: Home / Self Care | Payer: Medicare Other | Attending: Emergency Medicine | Admitting: Emergency Medicine

## 2015-08-10 ENCOUNTER — Non-Acute Institutional Stay (SKILLED_NURSING_FACILITY): Payer: Medicare Other | Admitting: Family

## 2015-08-10 ENCOUNTER — Emergency Department (HOSPITAL_BASED_OUTPATIENT_CLINIC_OR_DEPARTMENT_OTHER): Payer: Medicare Other

## 2015-08-10 ENCOUNTER — Encounter (HOSPITAL_COMMUNITY): Payer: Self-pay | Admitting: *Deleted

## 2015-08-10 DIAGNOSIS — E039 Hypothyroidism, unspecified: Secondary | ICD-10-CM | POA: Diagnosis not present

## 2015-08-10 DIAGNOSIS — Z87448 Personal history of other diseases of urinary system: Secondary | ICD-10-CM | POA: Insufficient documentation

## 2015-08-10 DIAGNOSIS — I10 Essential (primary) hypertension: Secondary | ICD-10-CM | POA: Insufficient documentation

## 2015-08-10 DIAGNOSIS — Y9289 Other specified places as the place of occurrence of the external cause: Secondary | ICD-10-CM | POA: Insufficient documentation

## 2015-08-10 DIAGNOSIS — I824Z2 Acute embolism and thrombosis of unspecified deep veins of left distal lower extremity: Secondary | ICD-10-CM | POA: Diagnosis not present

## 2015-08-10 DIAGNOSIS — M069 Rheumatoid arthritis, unspecified: Secondary | ICD-10-CM | POA: Insufficient documentation

## 2015-08-10 DIAGNOSIS — Z9889 Other specified postprocedural states: Secondary | ICD-10-CM | POA: Insufficient documentation

## 2015-08-10 DIAGNOSIS — W1839XA Other fall on same level, initial encounter: Secondary | ICD-10-CM | POA: Insufficient documentation

## 2015-08-10 DIAGNOSIS — S9032XA Contusion of left foot, initial encounter: Secondary | ICD-10-CM | POA: Insufficient documentation

## 2015-08-10 DIAGNOSIS — S8002XA Contusion of left knee, initial encounter: Secondary | ICD-10-CM | POA: Diagnosis not present

## 2015-08-10 DIAGNOSIS — E785 Hyperlipidemia, unspecified: Secondary | ICD-10-CM | POA: Diagnosis not present

## 2015-08-10 DIAGNOSIS — Z7952 Long term (current) use of systemic steroids: Secondary | ICD-10-CM | POA: Insufficient documentation

## 2015-08-10 DIAGNOSIS — R609 Edema, unspecified: Secondary | ICD-10-CM

## 2015-08-10 DIAGNOSIS — Z8701 Personal history of pneumonia (recurrent): Secondary | ICD-10-CM | POA: Insufficient documentation

## 2015-08-10 DIAGNOSIS — F329 Major depressive disorder, single episode, unspecified: Secondary | ICD-10-CM | POA: Insufficient documentation

## 2015-08-10 DIAGNOSIS — K219 Gastro-esophageal reflux disease without esophagitis: Secondary | ICD-10-CM | POA: Insufficient documentation

## 2015-08-10 DIAGNOSIS — Y998 Other external cause status: Secondary | ICD-10-CM | POA: Insufficient documentation

## 2015-08-10 DIAGNOSIS — T148XXA Other injury of unspecified body region, initial encounter: Secondary | ICD-10-CM

## 2015-08-10 DIAGNOSIS — Z7982 Long term (current) use of aspirin: Secondary | ICD-10-CM | POA: Insufficient documentation

## 2015-08-10 DIAGNOSIS — M810 Age-related osteoporosis without current pathological fracture: Secondary | ICD-10-CM | POA: Insufficient documentation

## 2015-08-10 DIAGNOSIS — I5032 Chronic diastolic (congestive) heart failure: Secondary | ICD-10-CM | POA: Insufficient documentation

## 2015-08-10 DIAGNOSIS — I82402 Acute embolism and thrombosis of unspecified deep veins of left lower extremity: Secondary | ICD-10-CM | POA: Diagnosis not present

## 2015-08-10 DIAGNOSIS — Z79899 Other long term (current) drug therapy: Secondary | ICD-10-CM | POA: Insufficient documentation

## 2015-08-10 DIAGNOSIS — Z8673 Personal history of transient ischemic attack (TIA), and cerebral infarction without residual deficits: Secondary | ICD-10-CM | POA: Insufficient documentation

## 2015-08-10 DIAGNOSIS — T148 Other injury of unspecified body region: Secondary | ICD-10-CM | POA: Diagnosis not present

## 2015-08-10 DIAGNOSIS — Z8711 Personal history of peptic ulcer disease: Secondary | ICD-10-CM | POA: Insufficient documentation

## 2015-08-10 DIAGNOSIS — Z8619 Personal history of other infectious and parasitic diseases: Secondary | ICD-10-CM | POA: Insufficient documentation

## 2015-08-10 DIAGNOSIS — I2510 Atherosclerotic heart disease of native coronary artery without angina pectoris: Secondary | ICD-10-CM | POA: Insufficient documentation

## 2015-08-10 DIAGNOSIS — Y9389 Activity, other specified: Secondary | ICD-10-CM | POA: Insufficient documentation

## 2015-08-10 DIAGNOSIS — F419 Anxiety disorder, unspecified: Secondary | ICD-10-CM | POA: Insufficient documentation

## 2015-08-10 LAB — PROTIME-INR
INR: 1.13 (ref 0.00–1.49)
Prothrombin Time: 14.3 seconds (ref 11.6–15.2)

## 2015-08-10 MED ORDER — ENOXAPARIN (LOVENOX) PATIENT EDUCATION KIT
PACK | Freq: Once | Status: AC
  Administered 2015-08-10: 17:00:00
  Filled 2015-08-10: qty 1

## 2015-08-10 MED ORDER — ENOXAPARIN SODIUM 30 MG/0.3ML ~~LOC~~ SOLN: 65.0000 mg | Freq: Two times a day (BID) | SUBCUTANEOUS | Status: DC

## 2015-08-10 MED ORDER — WARFARIN SODIUM 4 MG PO TABS: 4.0000 mg | ORAL_TABLET | Freq: Every day | ORAL | Status: DC

## 2015-08-10 MED ORDER — ENOXAPARIN SODIUM 80 MG/0.8ML ~~LOC~~ SOLN
1.0000 mg/kg | SUBCUTANEOUS | Status: AC
  Administered 2015-08-10: 65 mg via SUBCUTANEOUS
  Filled 2015-08-10: qty 0.8

## 2015-08-10 MED ORDER — WARFARIN SODIUM 5 MG PO TABS: 5.0000 mg | ORAL_TABLET | Freq: Every day | ORAL | Status: DC

## 2015-08-10 NOTE — Progress Notes (Signed)
VASCULAR LAB PRELIMINARY  PRELIMINARY  PRELIMINARY  PRELIMINARY  Bilateral lower extremity venous duplex completed.    Preliminary report:  Bilateral evaluation completed per protocol in the presence of DVT noted in the requested extremity.  Right - No evidence of DVT, superficial thrombosis, or Baker's cyst involving the right lower extremity. Left - Positive for acute and chronic DVT noted in the common femoral, femoral, poplitealand gastrocnemius veins Veins are dilated with minimal flow.   Myrtis Maille, RVS 08/10/2015, 1:05 PM

## 2015-08-10 NOTE — ED Provider Notes (Signed)
CSN: [id number]     Arrival date & time 08/10/15  1035 History   First MD Initiated Contact with Patient 08/10/15 1144     Chief Complaint  Patient presents with  . Leg Injury   (Consider location/radiation/quality/duration/timing/severity/associated sxs/prior Treatment) The history is provided by the patient. No language interpreter was used.    [name] is an 82 y.o female with a very extensive past medical history who was sent by nursing home facility for possible DVT to the left leg. She fell 3 days ago and landed on the left leg.  She has been able to walk since.  She was recently seen for sepsis related to UTI. Not on blood thinners. She denies any shortness of breath, cough, chest pain, abdominal pain, vomiting.  Past Medical History  Diagnosis Date  . Arthritis   . Right ear pain   . Renal insufficiency   . RA (rheumatoid arthritis) ([place])   . PUD (peptic ulcer disease)   . GI bleed 2005  . GERD (gastroesophageal reflux disease)   . Hiatal hernia   . Hypothyroidism   . Osteoporosis   . Depression   . Anxiety   . Coronary artery disease     a. 2012 s/p NSTEMI/Cath: 95% apical LAD dzs, otw nonobs dzs-->Med Rx;  b. 05/2011 MV: no ischemia, EF 79%, inf infarct- attenuation.  . CVA (cerebral vascular accident) ([place])   . TIA (transient ischemic attack)   . Fall   . DVT of lower extremity (deep venous thrombosis) ([place])   . HCAP (healthcare-associated pneumonia) 01/22/2013  . Enteritis due to Clostridium difficile 01/04/2013  . Sepsis ([place]) 12/19/2012  . Recurrent colitis due to Clostridium difficile 06/02/2012  . Essential hypertension   . Hyperlipidemia   . Fatigue   . Weight loss   . Hemorrhoids   . Malnutrition ([place])   . Dyspnea   . Elevated troponin     a. 10/2014 in setting of sepsis/?HCAP.  Marland Kitchen Chronic diastolic CHF (congestive heart failure) ([place])     a. 10/2014 Echo: EF 60-65%, mild LVH, grade 1 DD, dynamic obstruction @ rest - peak velocity of 271 cm/sec, peak grad  of 19mmHg, PASP 26mmHg.  Marland Kitchen Obstructive hypertrophic cardiomyopathy ([place])     a. 10/2014 Echo: EF 60-65%, mild LVH, grade 1 DD, dynamic obstruction @ rest - peak velocity of 271 cm/sec, peak grad of 1mmHg, PASP 8mmHg.   Past Surgical History  Procedure Laterality Date  . Nstemi  06/2010  . Spinal fusion surgery    . Knee surgery    . Cardiac catheterization      SHOWED RUPTURE PLAQUE IN THE LAD. THE LAD IS NONOBSTRUCTIVE WITH ONLY 30-40% STENOSIS  . Back surgery    . Breast surgery  1964    x3  . Ankle surgery    . Colonoscopy     Family History  Problem Relation Age of Onset  . Kidney disease Mother   . Kidney disease Brother   . Lung cancer Father    Social History  Substance Use Topics  . Smoking status: Never Smoker   . Smokeless tobacco: Never Used  . Alcohol Use: No   OB History    No data available     Review of Systems  Respiratory: Negative for cough and shortness of breath.   Cardiovascular: Negative for chest pain and leg swelling.  All other systems reviewed and are negative.     Allergies  Biphosphate; Codeine; Morphine and related; Percocet; Plavix;  and Sulfur  Home Medications   Prior to Admission medications   Medication Sig Start Date End Date Taking? Authorizing Provider  Artificial Saliva (ACT DRY MOUTH) LOZG Use as directed in the mouth or throat as needed (for dry mouth).   Yes Historical Provider, MD  aspirin 81 MG chewable tablet Chew 81 mg by mouth daily.   Yes Historical Provider, MD  calcium-vitamin D (OSCAL WITH D) 500-200 MG-UNIT per tablet Take 1 tablet by mouth 3 (three) times daily.   Yes Historical Provider, MD  cetirizine (ZYRTEC) 10 MG tablet Take 10 mg by mouth daily.   Yes Historical Provider, MD  Cholecalciferol (VITAMIN D) 2000 UNITS CAPS Take 1 capsule (2,000 Units total) by mouth daily. 11/21/13  Yes [name], NP  Cranberry 200 MG CAPS Take 2 capsules by mouth 2 (two) times daily.   Yes Historical Provider, MD   esomeprazole (NEXIUM) 40 MG capsule Take 40 mg by mouth 2 (two) times daily before a meal. Take one capsule by mouth twice daily for stomach   Yes Historical Provider, MD  ferrous sulfate 325 (65 FE) MG tablet Take 325 mg by mouth 3 (three) times daily with meals.   Yes Historical Provider, MD  HYDROcodone-acetaminophen (NORCO/VICODIN) 5-325 MG tablet Take one tablet by mouth twice daily for pain. Take one tablet by mouth every 8 hours as needed for pain. Hold for sedation. Do not exceed 4gm of Tylenol in 24 hours Patient taking differently: Take 1 tablet by mouth 2 (two) times daily. May also take one tablet by mouth every 8 hours as needed for pain. Hold for sedation. Do not exceed 4gm of Tylenol in 24 hours 07/26/15  Yes Verlee Monte, MD  ipratropium (ATROVENT) 0.03 % nasal spray Place 2 sprays into both nostrils daily as needed (For asthma).    Yes Historical Provider, MD  ipratropium-albuterol (DUONEB) 0.5-2.5 (3) MG/3ML SOLN Take 3 mLs by nebulization every 8 (eight) hours as needed. For shortness of breath   Yes Historical Provider, MD  levothyroxine (SYNTHROID, LEVOTHROID) 50 MCG tablet Take 50 mcg by mouth daily.    Yes Historical Provider, MD  lidocaine (LIDODERM) 5 % Place 1 patch onto the skin daily. Remove & Discard patch within 12 hours or as directed by MD 04/04/15  Yes Lauree Chandler, NP  LORazepam (ATIVAN) 0.5 MG tablet Take one tablet by mouth every night at bedtime for anxiety 07/26/15  Yes Verlee Monte, MD  methocarbamol (ROBAXIN) 500 MG tablet Take 500 mg by mouth at bedtime.    Yes Historical Provider, MD  metoprolol tartrate (LOPRESSOR) 25 MG tablet Take 25 mg by mouth 2 (two) times daily.   Yes Historical Provider, MD  montelukast (SINGULAIR) 10 MG tablet Take 10 mg by mouth at bedtime.   Yes Historical Provider, MD  Multiple Vitamins-Minerals (CERTAVITE/ANTIOXIDANTS PO) Take 1 tablet by mouth daily.    Yes Historical Provider, MD  Omega-3 Fatty Acids (FISH OIL) 1200 MG CAPS  Take 1,200 mg by mouth daily.   Yes Historical Provider, MD  ondansetron (ZOFRAN) 4 MG tablet Take 1 tablet (4 mg total) by mouth 4 (four) times daily -  before meals and at bedtime. Take one tablet by mouth every 6 hours as needed for nausea/vomiting Patient taking differently: Take 4 mg by mouth every 6 (six) hours as needed for nausea or vomiting.  07/26/15  Yes Mutaz Elmahi, MD  PATADAY 0.2 % SOLN Place 1 drop into both eyes daily.  03/26/15  Yes  Historical Provider, MD  predniSONE (DELTASONE) 10 MG tablet Take 10 mg by mouth daily.  03/08/15  Yes Historical Provider, MD  promethazine (PHENERGAN) 25 MG tablet Take 25 mg by mouth 2 (two) times daily as needed for nausea.  03/30/15  Yes Historical Provider, MD  rosuvastatin (CRESTOR) 20 MG tablet Take 20 mg by mouth every evening.  07/05/13  Yes [name], NP  saccharomyces boulardii (FLORASTOR) 250 MG capsule Take 250 mg by mouth 2 (two) times daily.    Yes Historical Provider, MD  sertraline (ZOLOFT) 50 MG tablet Take 50 mg by mouth every morning.   Yes Historical Provider, MD  enoxaparin (LOVENOX) 30 MG/0.3ML injection Inject 0.65 mLs (65 mg total) into the skin every 12 (twelve) hours. 08/10/15 08/14/15  Ottie Glazier, PA-C  warfarin (COUMADIN) 4 MG tablet Take 1 tablet (4 mg total) by mouth daily. 08/10/15   Schylar Wuebker Patel-Mills, PA-C   BP 163/77 mmHg  Pulse 80  Temp(Src) 98.7 F (37.1 C) (Oral)  Resp 16  SpO2 96%  LMP  (LMP Unknown) Physical Exam  Constitutional: She is oriented to person, place, and time. She appears well-developed and well-nourished. No distress.  HENT:  Head: Normocephalic and atraumatic.  Eyes: Conjunctivae are normal.  Neck: Normal range of motion. Neck supple.  Cardiovascular: Normal rate, regular rhythm and normal heart sounds.   Pulmonary/Chest: Effort normal. No respiratory distress. She has no wheezes. She has no rales.  No respiratory distress. Patient was put on oxygen by EMS but does not report shortness  of breath. Oxygen was turned off. She remained at 98% on room air.  Abdominal: Soft. Bowel sounds are normal.  Musculoskeletal: Normal range of motion. She exhibits tenderness. She exhibits no edema.  Large Ecchymotic area from the knee to the tarsals of the foot. Mild calf tenderness. No swelling. 2+ DP pulse. No pallor. Able to flex and extend toes and knee.    Neurological: She is alert and oriented to person, place, and time.  Able to answer questions appropriately. GCS 15. No sensory or motor deficit.  Skin: Skin is warm and dry.  Vitals reviewed.   ED Course  Procedures (including critical care time) Labs Review Labs Reviewed  PROTIME-INR    Imaging Review No results found. I have personally reviewed and evaluated these images and lab results as part of my medical decision-making.   EKG Interpretation None      MDM   Final diagnoses:  DVT (deep venous thrombosis), left  Patient presents from nursing home for possible DVT to left leg after fall 5 days ago. She is not on blood thinners. No signs of respiratory distress. 95% oxygen on room air.  Ultrasound tech inform me that the patient has a DVT in the left leg. No DVT in the right leg. Patient states she cannot take Plavix or Xarelto due to a reaction. She is unaware what the reaction is to Xarelto but states that she gets a sunburn with Plavix. She has taken Lovenox and warfarin in the past. She is requesting a Lovenox injection.  I discussed this patient with Dr. Johnney Killian. Patient was put on Lovenox bridge and had her first dose in the ED. She was told to take this every 12 hours for the next 3 days and have her INR rechecked. I also discussed taking warfarin once a day starting tomorrow. Patient was started on 4 mg instead of 5 due to elevated creatinine. The instructions were written out on her discharge paperwork  to inform the nursing home.  I believe patient is safe for discharge back to nursing facility.  Ottie Glazier, PA-C 08/10/15 1638  Charlesetta Shanks, MD 08/15/15 1027

## 2015-08-10 NOTE — Discharge Instructions (Signed)
Deep Vein Thrombosis Follow up with your primary care provider. You had your first dose of Lovenox in the emergency department on 08/10/15. Take your second dose tomorrow morning. He will take this medication every 12 hours and have your INR rechecked in 3 days. You will take warfarin once a day starting tomorrow. A deep vein thrombosis (DVT) is a blood clot (thrombus) that usually occurs in a deep, larger vein of the lower leg or the pelvis, or in an upper extremity such as the arm. These are dangerous and can lead to serious and even life-threatening complications if the clot travels to the lungs. A DVT can damage the valves in your leg veins so that instead of flowing upward, the blood pools in the lower leg. This is called post-thrombotic syndrome, and it can result in pain, swelling, discoloration, and sores on the leg. CAUSES A DVT is caused by the formation of a blood clot in your leg, pelvis, or arm. Usually, several things contribute to the formation of blood clots. A clot may develop when:  Your blood flow slows down.  Your vein becomes damaged in some way.  You have a condition that makes your blood clot more easily. RISK FACTORS A DVT is more likely to develop in:  People who are older, especially over 22 years of age.  People who are overweight (obese).  People who sit or lie still for a long time, such as during long-distance travel (over 4 hours), bed rest, hospitalization, or during recovery from certain medical conditions like a stroke.  People who do not engage in much physical activity (sedentary lifestyle).  People who have chronic breathing disorders.  People who have a personal or family history of blood clots or blood clotting disease.  People who have peripheral vascular disease (PVD), diabetes, or some types of cancer.  People who have heart disease, especially if the person had a recent heart attack or has congestive heart failure.  People who have neurological  diseases that affect the legs (leg paresis).  People who have had a traumatic injury, such as breaking a hip or leg.  People who have recently had major or lengthy surgery, especially on the hip, knee, or abdomen.  People who have had a central line placed inside a large vein.  People who take medicines that contain the hormone estrogen. These include birth control pills and hormone replacement therapy.  Pregnancy or during childbirth or the postpartum period.  Long plane flights (over 8 hours). SIGNS AND SYMPTOMS Symptoms of a DVT can include:   Swelling of your leg or arm, especially if one side is much worse.  Warmth and redness of your leg or arm, especially if one side is much worse.  Pain in your arm or leg. If the clot is in your leg, symptoms may be more noticeable or worse when you stand or walk.  A feeling of pins and needles, if the clot is in the arm. The symptoms of a DVT that has traveled to the lungs (pulmonary embolism, PE) usually start suddenly and include:  Shortness of breath while active or at rest.  Coughing or coughing up blood or blood-tinged mucus.  Chest pain that is often worse with deep breaths.  Rapid or irregular heartbeat.  Feeling light-headed or dizzy.  Fainting.  Feeling anxious.  Sweating. There may also be pain and swelling in a leg if that is where the blood clot started. These symptoms may represent a serious problem that is an  emergency. Do not wait to see if the symptoms will go away. Get medical help right away. Call your local emergency services (911 in the [place] Do not drive yourself to the hospital. DIAGNOSIS Your health care provider will take a medical history and perform a physical exam. You may also have other tests, including:  Blood tests to assess the clotting properties of your blood.  Imaging tests, such as CT, ultrasound, MRI, X-ray, and other tests to see if you have clots anywhere in your body. TREATMENT After a  DVT is identified, it can be treated. The type of treatment that you receive depends on many factors, such as the cause of your DVT, your risk for bleeding or developing more clots, and other medical conditions that you have. Sometimes, a combination of treatments is necessary. Treatment options may be combined and include:  Monitoring the blood clot with ultrasound.  Taking medicines by mouth, such as newer blood thinners (anticoagulants), thrombolytics, or warfarin.  Taking anticoagulant medicine by injection or through an IV tube.  Wearing compression stockings or using different types ofdevices.  Surgery (rare) to remove the blood clot or to place a filter in your abdomen to stop the blood clot from traveling to your lungs. Treatments for a DVT are often divided into immediate treatment and long-term treatment (up to 3 months after DVT). You can work with your health care provider to choose the treatment program that is best for you. HOME CARE INSTRUCTIONS If you are taking a newer oral anticoagulant:  Take the medicine every single day at the same time each day.  Understand what foods and drugs interact with this medicine.  Understand that there are no regular blood tests required when using this medicine.  Understand the side effects of this medicine, including excessive bruising or bleeding. Ask your health care provider or pharmacist about other possible side effects. If you are taking warfarin:  Understand how to take warfarin and know which foods can affect how warfarin works in Veterinary surgeon.  Understand that it is dangerous to take too much or too little warfarin. Too much warfarin increases the risk of bleeding. Too little warfarin continues to allow the risk for blood clots.  Follow your PT and INR blood testing schedule. The PT and INR results allow your health care provider to adjust your dose of warfarin. It is very important that you have your PT and INR tested as often as  told by your health care provider.  Avoid major changes in your diet, or tell your health care provider before you change your diet. Arrange a visit with a registered dietitian to answer your questions. Many foods, especially foods that are high in vitamin K, can interfere with warfarin and affect the PT and INR results. Eat a consistent amount of foods that are high in vitamin K, such as:  Spinach, kale, broccoli, cabbage, collard greens, turnip greens, Brussels sprouts, peas, cauliflower, seaweed, and parsley.  Beef liver and pork liver.  Green tea.  Soybean oil.  Tell your health care provider about any and all medicines, vitamins, and supplements that you take, including aspirin and other over-the-counter anti-inflammatory medicines. Be especially cautious with aspirin and anti-inflammatory medicines. Do not take those before you ask your health care provider if it is safe to do so. This is important because many medicines can interfere with warfarin and affect the PT and INR results.  Do not start or stop taking any over-the-counter or prescription medicine unless your health  care provider or pharmacist tells you to do so. If you take warfarin, you will also need to do these things:  Hold pressure over cuts for longer than usual.  Tell your dentist and other health care providers that you are taking warfarin before you have any procedures in which bleeding may occur.  Avoid alcohol or drink very small amounts. Tell your health care provider if you change your alcohol intake.  Do not use tobacco products, including cigarettes, chewing tobacco, and e-cigarettes. If you need help quitting, ask your health care provider.  Avoid contact sports. General Instructions  Take over-the-counter and prescription medicines only as told by your health care provider. Anticoagulant medicines can have side effects, including easy bruising and difficulty stopping bleeding. If you are prescribed an  anticoagulant, you will also need to do these things:  Hold pressure over cuts for longer than usual.  Tell your dentist and other health care providers that you are taking anticoagulants before you have any procedures in which bleeding may occur.  Avoid contact sports.  Wear a medical alert bracelet or carry a medical alert card that says you have had a PE.  Ask your health care provider how soon you can go back to your normal activities. Stay active to prevent new blood clots from forming.  Make sure to exercise while traveling or when you have been sitting or standing for a long period of time. It is very important to exercise. Exercise your legs by walking or by tightening and relaxing your leg muscles often. Take frequent walks.  Wear compression stockings as told by your health care provider to help prevent more blood clots from forming.  Do not use tobacco products, including cigarettes, chewing tobacco, and e-cigarettes. If you need help quitting, ask your health care provider.  Keep all follow-up appointments with your health care provider. This is important. PREVENTION Take these actions to decrease your risk of developing another DVT:  Exercise regularly. For at least 30 minutes every day, engage in:  Activity that involves moving your arms and legs.  Activity that encourages good blood flow through your body by increasing your heart rate.  Exercise your arms and legs every hour during long-distance travel (over 4 hours). Drink plenty of water and avoid drinking alcohol while traveling.  Avoid sitting or lying in bed for long periods of time without moving your legs.  Maintain a weight that is appropriate for your height. Ask your health care provider what weight is healthy for you.  If you are a woman who is over 68 years of age, avoid unnecessary use of medicines that contain estrogen. These include birth control pills.  Do not smoke, especially if you take estrogen  medicines. If you need help quitting, ask your health care provider. If you are hospitalized, prevention measures may include:  Early walking after surgery, as soon as your health care provider says that it is safe.  Receiving anticoagulants to prevent blood clots.If you cannot take anticoagulants, other options may be available, such as wearing compression stockings or using different types of devices. SEEK IMMEDIATE MEDICAL CARE IF:  You have new or increased pain, swelling, or redness in an arm or leg.  You have numbness or tingling in an arm or leg.  You have shortness of breath while active or at rest.  You have chest pain.  You have a rapid or irregular heartbeat.  You feel light-headed or dizzy.  You cough up blood.  You notice blood  in your vomit, bowel movement, or urine. These symptoms may represent a serious problem that is an emergency. Do not wait to see if the symptoms will go away. Get medical help right away. Call your local emergency services (911 in the [place] Do not drive yourself to the hospital.   This information is not intended to replace advice given to you by your health care provider. Make sure you discuss any questions you have with your health care provider.   Document Released: 06/16/2005 Document Revised: 03/07/2015 Document Reviewed: 10/11/2014 Elsevier Interactive Patient Education [employer].

## 2015-08-10 NOTE — ED Notes (Signed)
Bed: [id number] Expected date:  Expected time:  Means of arrival:  Comments: Ems- possible DVT

## 2015-08-10 NOTE — ED Notes (Signed)
PTAR CALLED AND THERE WILL BE A 2 HOUR OR MORE  DELAY.

## 2015-08-10 NOTE — ED Notes (Signed)
Patient is alert and oriented x4.  She was sent by her facility for possible DVT to the left leg. Patient states that she had fallen 3 days ago and landed on the left leg.  Currently patient denies Any pain in the left but there is notable warmth in the leg.

## 2015-08-10 NOTE — Progress Notes (Signed)
Patient ID: Taleigh Sepe, female   DOB: 1930-05-15, 82 y.o.   MRN: [id number]  Location: [facility]  Provider:   [name], MD  Code Status: Full Code  Goals of care: Advanced Directive information Advanced Directives 08/10/2015  Does patient have an advance directive? Yes  Type of Advance Directive Healthcare Power of Attorney  Copy of advanced directive(s) in chart? -     Chief Complaint  Patient presents with  . Acute Visit    HPI:  Pt is a 82 y.o. female seen today at [facility] for Left leg doppler studies results.She is here for long term care post hospital admission from 07/22/15-07/26/15 with sepsis from [facility] UTI she completed antibiotics.She has past medical History of CVA, CAD, hypothyroidism, RA, HTN, chronic diastolic CHF, HLD, CKD among others. She is seen in her room today with daughter at bedside. Her Left leg doppler venous studies done 08/09/2015 preliminary report showed possible DVT beginning at Left CFV to FV Dist. Patient denies pain to leg except when she walks.She also denies any SOB, chest pain or palpitation. Preliminary results discussed with patient and her daughter who prefers for patient to be send to [facility] ER for evaluation and further treatment. Facility Nursing  Supervisor notified to send patient via EMS to [employer] [facility] .  Review of Systems  Constitutional: Negative for fever and chills.  HENT: Negative.   Eyes: Negative.   Respiratory: Negative for cough, sputum production, shortness of breath and wheezing.   Cardiovascular: Positive for leg swelling. Negative for chest pain, palpitations and PND.  Gastrointestinal: Negative.   Musculoskeletal: Negative for joint pain and falls.  Skin: Negative for itching and rash.       Bruise left leg   Neurological: Negative.   Endo/Heme/Allergies: Bruises/bleeds easily.  Psychiatric/Behavioral: Negative.     Past Medical History  Diagnosis Date   . Arthritis   . Right ear pain   . Renal insufficiency   . RA (rheumatoid arthritis) ([place])   . PUD (peptic ulcer disease)   . GI bleed 2005  . GERD (gastroesophageal reflux disease)   . Hiatal hernia   . Hypothyroidism   . Osteoporosis   . Depression   . Anxiety   . Coronary artery disease     a. 2012 s/p NSTEMI/Cath: 95% apical LAD dzs, otw nonobs dzs-->Med Rx;  b. 05/2011 MV: no ischemia, EF 79%, inf infarct- attenuation.  . CVA (cerebral vascular accident) ([place])   . TIA (transient ischemic attack)   . Fall   . DVT of lower extremity (deep venous thrombosis) ([place])   . HCAP (healthcare-associated pneumonia) 01/22/2013  . Enteritis due to Clostridium difficile 01/04/2013  . Sepsis ([place]) 12/19/2012  . Recurrent colitis due to Clostridium difficile 06/02/2012  . Essential hypertension   . Hyperlipidemia   . Fatigue   . Weight loss   . Hemorrhoids   . Malnutrition ([place])   . Dyspnea   . Elevated troponin     a. 10/2014 in setting of sepsis/?HCAP.  Marland Kitchen Chronic diastolic CHF (congestive heart failure) ([place])     a. 10/2014 Echo: EF 60-65%, mild LVH, grade 1 DD, dynamic obstruction @ rest - peak velocity of 271 cm/sec, peak grad of 62mmHg, PASP 74mmHg.  Marland Kitchen Obstructive hypertrophic cardiomyopathy ([place])     a. 10/2014 Echo: EF 60-65%, mild LVH, grade 1 DD, dynamic obstruction @ rest - peak velocity of 271 cm/sec, peak grad of 49mmHg, PASP 52mmHg.  Past Surgical History  Procedure Laterality Date  . Nstemi  06/2010  . Spinal fusion surgery    . Knee surgery    . Cardiac catheterization      SHOWED RUPTURE PLAQUE IN THE LAD. THE LAD IS NONOBSTRUCTIVE WITH ONLY 30-40% STENOSIS  . Back surgery    . Breast surgery  1964    x3  . Ankle surgery    . Colonoscopy      Allergies  Allergen Reactions  . Biphosphate   . Codeine Swelling  . Morphine And Related Other (See Comments)    unknown  . Percocet [Oxycodone-Acetaminophen]     unknown  . Plavix [Clopidogrel Bisulfate] Other (See  Comments)    RASH  . Sulfur Other (See Comments)    GI upset      Medication List    Notice    This visit is during an admission. Changes to the med list made in this visit will be reflected in the After Visit Summary of the admission.      Immunization History  Administered Date(s) Administered  . Influenza-Unspecified 05/09/2013, 04/27/2014, 04/17/2015  . PPD Test 12/22/2012, 03/14/2014, 03/08/2015  . Pneumococcal-Unspecified 05/09/2013, 09/09/2013   Pertinent  Health Maintenance Due  Topic Date Due  . INFLUENZA VACCINE  01/29/2016  . DEXA SCAN  Completed  . PNA vac Low Risk Adult  Addressed   Fall Risk  10/04/2014  Falls in the past year? Yes  Number falls in past yr: 2 or more  Injury with Fall? No  Risk Factor Category  High Fall Risk  Risk for fall due to : Impaired balance/gait;Impaired mobility;Medication side effect;History of fall(s)  Follow up Education provided;Falls evaluation completed;Falls prevention discussed    Filed Vitals:   08/10/15 1128  BP: 191/96  Pulse: 80  Temp: 98.9 F (37.2 C)  Resp: 20  Weight: 144 lb 9.6 oz (65.59 kg)  SpO2: 99%   Body mass index is 24.81 kg/(m^2). Physical Exam  Constitutional: She is oriented to person, place, and time. She appears well-developed and well-nourished. No distress.  HENT:  Head: Normocephalic.  Mouth/Throat: Oropharynx is clear and moist.  Eyes: Conjunctivae are normal. Pupils are equal, round, and reactive to light. Right eye exhibits no discharge. Left eye exhibits no discharge. No scleral icterus.  Neck: Neck supple.  Cardiovascular: Normal rate, regular rhythm, normal heart sounds and intact distal pulses.  Exam reveals no gallop and no friction rub.   No murmur heard. Pulmonary/Chest: Effort normal and breath sounds normal. No respiratory distress. She has no wheezes. She has no rales.  Abdominal: Soft. Bowel sounds are normal. She exhibits no distension and no mass. There is no tenderness. There  is no rebound and no guarding. No hernia.  Musculoskeletal: Normal range of motion. She exhibits no tenderness.  Left leg non-pitting edema.   Neurological: She is oriented to person, place, and time.  Skin: Skin is warm and dry. No rash noted. No erythema. No pallor.  Psychiatric: She has a normal mood and affect.    Labs reviewed:  Recent Labs  10/29/14 1659  11/02/14 0846 11/03/14 0350  07/24/15 1122 07/25/15 0706 07/26/15 0655  NA 137  < > 140 136  < > 143 141 139  K 4.1  < > 3.4* 3.5  < > 3.0* 3.4* 3.3*  CL 104  < > 109 106  < > 107 109 108  CO2 13*  < > 24 23  < > 21* 21* 19*  GLUCOSE 352*  < > 89 100*  < > 132* 83 87  BUN 21*  < > 32* 28*  < > 20 18 12   CREATININE 1.80*  < > 1.54* 1.70*  < > 1.54* 1.52* 1.38*  CALCIUM 9.1  < > 8.0* 7.9*  < > 8.5* 8.4* 8.1*  MG 1.9  --  1.7 2.0  --   --  1.4*  --   PHOS 4.8*  --   --   --   --   --   --   --   < > = values in this interval not displayed.  Recent Labs  10/30/14 0414 07/22/15 1123 07/23/15 0226  AST 41 23 29  ALT 25 15 13*  ALKPHOS 48 66 46  BILITOT 0.9 1.1 0.8  PROT 6.7 6.3* 4.9*  ALBUMIN 3.3* 2.9* 2.1*    Recent Labs  10/29/14 1029  06/28/15 1555 07/22/15 1123  07/24/15 1122 07/25/15 0706 07/26/15 0655  WBC 14.1*  < > 13.9* 27.8*  < > 18.1* 17.5* 13.4*  NEUTROABS 11.4*  --  12.5* 24.0*  --   --   --   --   HGB 11.8*  < > 12.4 13.0  < > 10.0* 10.5* 10.4*  HCT 38.2  < > 38.2 39.7  < > 31.0* 32.6* 32.0*  MCV 90.5  < > 87.5 88.6  < > 88.8 89.6 87.9  PLT 110*  < > 173.0 116*  < > 204 128* 131*  < > = values in this interval not displayed. Lab Results  Component Value Date   TSH 0.708 07/22/2015   No results found for: HGBA1C Lab Results  Component Value Date   CHOL 115 03/28/2015   HDL 47 03/28/2015   LDLCALC 41 03/28/2015   LDLDIRECT 60.7 11/13/2010   TRIG 133 03/28/2015   CHOLHDL 2.9 12/02/2011    Significant Diagnostic Results in last 30 days:  Dg Chest 2 View  07/22/2015  CLINICAL DATA:   Fever and hypoxia EXAM: CHEST - 2 VIEW COMPARISON:  06/28/2015 FINDINGS: Cardiac shadow is stable. A hiatal hernia is again seen. Fixation hardware is again noted within the thoracolumbar spine. The previously noted fracture of the right Harrington rod is again visualized. No acute bony abnormality is seen. No focal infiltrate is noted. IMPRESSION: No acute abnormality seen. Electronically Signed   By: Inez Catalina M.D.   On: 07/22/2015 13:27   Dg Chest Port 1 View  07/23/2015  CLINICAL DATA:  Sepsis EXAM: PORTABLE CHEST 1 VIEW COMPARISON:  July 22, 2015 FINDINGS: There is slight left base atelectasis. Lungs elsewhere clear. Heart is upper normal in size with pulmonary vascularity within normal limits. Hiatal hernia present. Rod fixation in the thoracic and lumbar regions is stable. There is atherosclerotic calcification in the aorta. There is evidence of old trauma involving the proximal right humerus. IMPRESSION: Slight left base atelectasis. No edema or consolidation. No change in cardiac silhouette. Hiatal hernia present. Electronically Signed   By: Lowella Grip III M.D.   On: 07/23/2015 07:04    Assessment/Plan  Bruise  Left leg doppler venous studies done 08/09/2015 preliminary report showed possible DVT beginning at Left CFV to FV Dist.Preliminary results discussed with patient and her daughter who prefers for patient to be send to [facility] ER for evaluation and further treatment. Facility Nursing  Supervisor notified to send patient via EMS to [employer] [facility] .   DVT: Left leg doppler venous studies done 08/09/2015 preliminary report  showed possible DVT beginning at Left CFV to FV Dist.Send to ER for evaluation.  HTN: Sys B/p in the 160's-180's. Send to ER for evaluation.     Family/ staff Communication: Reviewed Plan of Care with Patient, Patient's daughter and Facility Nursing Supervisor   Labs/tests ordered: None send to ER.

## 2015-08-17 ENCOUNTER — Non-Acute Institutional Stay (SKILLED_NURSING_FACILITY): Payer: Medicare Other | Admitting: Family

## 2015-08-17 DIAGNOSIS — R399 Unspecified symptoms and signs involving the genitourinary system: Secondary | ICD-10-CM | POA: Insufficient documentation

## 2015-08-17 DIAGNOSIS — I10 Essential (primary) hypertension: Secondary | ICD-10-CM | POA: Diagnosis not present

## 2015-08-17 NOTE — Progress Notes (Signed)
Patient ID: [name], female   DOB: May 13, 1930, 82 y.o.   MRN: [id number]  Location:  [facility]  Provider:  [name], MD  Code Status: Full Code  Goals of care: Advanced Directive information Advanced Directives 08/10/2015  Does patient have an advance directive? Yes  Type of Advance Directive Healthcare Power of Attorney  Copy of advanced directive(s) in chart? -     Chief Complaint  Patient presents with  . Hypertension    HPI:  Pt is a 82 y.o. female seen today at [facility]  for elevated Blood pressure. She has a past medical History of HTN, TIA, DVT, GERD, RA, Hypothyroidism, HCAP,  Depression, Anxiety CAD among others. She is seen in her room today.Facility staff reports SBP has been running in the 160's to 170's for several days. She denies any chest pain, palpitation, SOB or headache. She complains of burning and pain when she voids. She denies any chills, fever, fatigue or loss of appetite. No AMS reported by facility staff.   Review of Systems  Constitutional: Negative for fever, chills and malaise/fatigue.  HENT: Positive for sore throat. Negative for hearing loss.   Eyes: Negative.   Respiratory: Negative.   Cardiovascular: Negative for chest pain, palpitations, orthopnea and leg swelling.  Gastrointestinal: Negative.   Genitourinary: Negative for dysuria, urgency, frequency, hematuria and flank pain.       Has Burning and pain with voiding   Skin: Negative.   Neurological: Negative for dizziness and headaches.  Psychiatric/Behavioral: Negative.     Past Medical History  Diagnosis Date  . Arthritis   . Right ear pain   . Renal insufficiency   . RA (rheumatoid arthritis) ([place])   . PUD (peptic ulcer disease)   . GI bleed 2005  . GERD (gastroesophageal reflux disease)   . Hiatal hernia   . Hypothyroidism   . Osteoporosis   . Depression   . Anxiety   . Coronary artery disease     a. 2012 s/p  NSTEMI/Cath: 95% apical LAD dzs, otw nonobs dzs-->Med Rx;  b. 05/2011 MV: no ischemia, EF 79%, inf infarct- attenuation.  . CVA (cerebral vascular accident) ([place])   . TIA (transient ischemic attack)   . Fall   . DVT of lower extremity (deep venous thrombosis) ([place])   . HCAP (healthcare-associated pneumonia) 01/22/2013  . Enteritis due to Clostridium difficile 01/04/2013  . Sepsis ([place]) 12/19/2012  . Recurrent colitis due to Clostridium difficile 06/02/2012  . Essential hypertension   . Hyperlipidemia   . Fatigue   . Weight loss   . Hemorrhoids   . Malnutrition ([place])   . Dyspnea   . Elevated troponin     a. 10/2014 in setting of sepsis/?HCAP.  Marland Kitchen Chronic diastolic CHF (congestive heart failure) ([place])     a. 10/2014 Echo: EF 60-65%, mild LVH, grade 1 DD, dynamic obstruction @ rest - peak velocity of 271 cm/sec, peak grad of 25mmHg, PASP 2mmHg.  Marland Kitchen Obstructive hypertrophic cardiomyopathy ([place])     a. 10/2014 Echo: EF 60-65%, mild LVH, grade 1 DD, dynamic obstruction @ rest - peak velocity of 271 cm/sec, peak grad of 74mmHg, PASP 36mmHg.   Past Surgical History  Procedure Laterality Date  . Nstemi  06/2010  . Spinal fusion surgery    . Knee surgery    . Cardiac catheterization      SHOWED RUPTURE PLAQUE IN THE LAD. THE LAD IS NONOBSTRUCTIVE WITH ONLY 30-40% STENOSIS  .  Back surgery    . Breast surgery  1964    x3  . Ankle surgery    . Colonoscopy      Allergies  Allergen Reactions  . Biphosphate   . Codeine Swelling  . Morphine And Related Other (See Comments)    unknown  . Percocet [Oxycodone-Acetaminophen]     unknown  . Plavix [Clopidogrel Bisulfate] Other (See Comments)    RASH  . Sulfur Other (See Comments)    GI upset      Medication List       This list is accurate as of: 08/17/15  4:43 PM.  Always use your most recent med list.               ACT DRY MOUTH Lozg  Use as directed in the mouth or throat as needed (for dry mouth).     aspirin 81 MG chewable tablet    Chew 81 mg by mouth daily.     calcium-vitamin D 500-200 MG-UNIT tablet  Commonly known as:  OSCAL WITH D  Take 1 tablet by mouth 3 (three) times daily.     CERTAVITE/ANTIOXIDANTS PO  Take 1 tablet by mouth daily.     cetirizine 10 MG tablet  Commonly known as:  ZYRTEC  Take 10 mg by mouth daily.     Cranberry 200 MG Caps  Take 2 capsules by mouth 2 (two) times daily.     enoxaparin 30 MG/0.3ML injection  Commonly known as:  LOVENOX  Inject 0.65 mLs (65 mg total) into the skin every 12 (twelve) hours.     esomeprazole 40 MG capsule  Commonly known as:  NEXIUM  Take 40 mg by mouth 2 (two) times daily before a meal. Take one capsule by mouth twice daily for stomach     ferrous sulfate 325 (65 FE) MG tablet  Take 325 mg by mouth 3 (three) times daily with meals.     Fish Oil 1200 MG Caps  Take 1,200 mg by mouth daily.     HYDROcodone-acetaminophen 5-325 MG tablet  Commonly known as:  NORCO/VICODIN  Take one tablet by mouth twice daily for pain. Take one tablet by mouth every 8 hours as needed for pain. Hold for sedation. Do not exceed 4gm of Tylenol in 24 hours     ipratropium 0.03 % nasal spray  Commonly known as:  ATROVENT  Place 2 sprays into both nostrils daily as needed (For asthma).     ipratropium-albuterol 0.5-2.5 (3) MG/3ML Soln  Commonly known as:  DUONEB  Take 3 mLs by nebulization every 8 (eight) hours as needed. For shortness of breath     levothyroxine 50 MCG tablet  Commonly known as:  SYNTHROID, LEVOTHROID  Take 50 mcg by mouth daily.     lidocaine 5 %  Commonly known as:  LIDODERM  Place 1 patch onto the skin daily. Remove & Discard patch within 12 hours or as directed by MD     LORazepam 0.5 MG tablet  Commonly known as:  ATIVAN  Take one tablet by mouth every night at bedtime for anxiety     methocarbamol 500 MG tablet  Commonly known as:  ROBAXIN  Take 500 mg by mouth at bedtime.     metoprolol tartrate 25 MG tablet  Commonly known as:   LOPRESSOR  Take 25 mg by mouth 2 (two) times daily.     montelukast 10 MG tablet  Commonly known as:  SINGULAIR  Take 10 mg  by mouth at bedtime.     ondansetron 4 MG tablet  Commonly known as:  ZOFRAN  Take 1 tablet (4 mg total) by mouth 4 (four) times daily -  before meals and at bedtime. Take one tablet by mouth every 6 hours as needed for nausea/vomiting     PATADAY 0.2 % Soln  Generic drug:  Olopatadine HCl  Place 1 drop into both eyes daily.     predniSONE 10 MG tablet  Commonly known as:  DELTASONE  Take 10 mg by mouth daily.     promethazine 25 MG tablet  Commonly known as:  PHENERGAN  Take 25 mg by mouth 2 (two) times daily as needed for nausea.     rosuvastatin 20 MG tablet  Commonly known as:  CRESTOR  Take 20 mg by mouth every evening.     saccharomyces boulardii 250 MG capsule  Commonly known as:  FLORASTOR  Take 250 mg by mouth 2 (two) times daily.     sertraline 50 MG tablet  Commonly known as:  ZOLOFT  Take 50 mg by mouth every morning.     Vitamin D 2000 units Caps  Take 1 capsule (2,000 Units total) by mouth daily.     warfarin 4 MG tablet  Commonly known as:  COUMADIN  Take 1 tablet (4 mg total) by mouth daily.        Immunization History  Administered Date(s) Administered  . Influenza-Unspecified 05/09/2013, 04/27/2014, 04/17/2015  . PPD Test 12/22/2012, 03/14/2014, 03/08/2015  . Pneumococcal-Unspecified 05/09/2013, 09/09/2013   Pertinent  Health Maintenance Due  Topic Date Due  . INFLUENZA VACCINE  01/29/2016  . DEXA SCAN  Completed  . PNA vac Low Risk Adult  Addressed   Fall Risk  10/04/2014  Falls in the past year? Yes  Number falls in past yr: 2 or more  Injury with Fall? No  Risk Factor Category  High Fall Risk  Risk for fall due to : Impaired balance/gait;Impaired mobility;Medication side effect;History of fall(s)  Follow up Education provided;Falls evaluation completed;Falls prevention discussed    Filed Vitals:   08/17/15  1631  BP: 165/95  Pulse: 94  Temp: 98.8 F (37.1 C)  Resp: 18  Weight: 147 lb 6.4 oz (66.86 kg)  SpO2: 99%   Body mass index is 25.29 kg/(m^2). Physical Exam  Constitutional: She is oriented to person, place, and time. She appears well-developed and well-nourished. No distress.  HENT:  Head: Normocephalic.  Right Ear: External ear normal.  Left Ear: External ear normal.  Mouth/Throat: Oropharynx is clear and moist.  Eyes: Conjunctivae and EOM are normal. Pupils are equal, round, and reactive to light. Right eye exhibits no discharge. Left eye exhibits no discharge. No scleral icterus.  Neck: No JVD present. No tracheal deviation present. No thyromegaly present.  Cardiovascular: Normal rate, regular rhythm, normal heart sounds and intact distal pulses.  Exam reveals no gallop and no friction rub.   No murmur heard. Pulmonary/Chest: Effort normal and breath sounds normal. No respiratory distress. She has no wheezes. She has no rales.  Abdominal: Soft. Bowel sounds are normal. She exhibits no distension and no mass. There is no tenderness. There is no rebound and no guarding.  Lymphadenopathy:    She has no cervical adenopathy.  Neurological: She is oriented to person, place, and time.  Skin: Skin is warm and dry. No rash noted. No erythema. No pallor.  Psychiatric: She has a normal mood and affect.    Labs reviewed:  Recent Labs  10/29/14 1659  11/02/14 0846 11/03/14 0350  07/24/15 1122 07/25/15 0706 07/26/15 0655  NA 137  < > 140 136  < > 143 141 139  K 4.1  < > 3.4* 3.5  < > 3.0* 3.4* 3.3*  CL 104  < > 109 106  < > 107 109 108  CO2 13*  < > 24 23  < > 21* 21* 19*  GLUCOSE 352*  < > 89 100*  < > 132* 83 87  BUN 21*  < > 32* 28*  < > 20 18 12   CREATININE 1.80*  < > 1.54* 1.70*  < > 1.54* 1.52* 1.38*  CALCIUM 9.1  < > 8.0* 7.9*  < > 8.5* 8.4* 8.1*  MG 1.9  --  1.7 2.0  --   --  1.4*  --   PHOS 4.8*  --   --   --   --   --   --   --   < > = values in this interval not  displayed.  Recent Labs  10/30/14 0414 07/22/15 1123 07/23/15 0226  AST 41 23 29  ALT 25 15 13*  ALKPHOS 48 66 46  BILITOT 0.9 1.1 0.8  PROT 6.7 6.3* 4.9*  ALBUMIN 3.3* 2.9* 2.1*    Recent Labs  10/29/14 1029  06/28/15 1555 07/22/15 1123  07/24/15 1122 07/25/15 0706 07/26/15 0655  WBC 14.1*  < > 13.9* 27.8*  < > 18.1* 17.5* 13.4*  NEUTROABS 11.4*  --  12.5* 24.0*  --   --   --   --   HGB 11.8*  < > 12.4 13.0  < > 10.0* 10.5* 10.4*  HCT 38.2  < > 38.2 39.7  < > 31.0* 32.6* 32.0*  MCV 90.5  < > 87.5 88.6  < > 88.8 89.6 87.9  PLT 110*  < > 173.0 116*  < > 204 128* 131*  < > = values in this interval not displayed. Lab Results  Component Value Date   TSH 0.708 07/22/2015   No results found for: HGBA1C Lab Results  Component Value Date   CHOL 115 03/28/2015   HDL 47 03/28/2015   LDLCALC 41 03/28/2015   LDLDIRECT 60.7 11/13/2010   TRIG 133 03/28/2015   CHOLHDL 2.9 12/02/2011    Significant Diagnostic Results in last 30 days:  Dg Chest 2 View  07/22/2015  CLINICAL DATA:  Fever and hypoxia EXAM: CHEST - 2 VIEW COMPARISON:  06/28/2015 FINDINGS: Cardiac shadow is stable. A hiatal hernia is again seen. Fixation hardware is again noted within the thoracolumbar spine. The previously noted fracture of the right Harrington rod is again visualized. No acute bony abnormality is seen. No focal infiltrate is noted. IMPRESSION: No acute abnormality seen. Electronically Signed   By: Inez Catalina M.D.   On: 07/22/2015 13:27   Dg Chest Port 1 View  07/23/2015  CLINICAL DATA:  Sepsis EXAM: PORTABLE CHEST 1 VIEW COMPARISON:  July 22, 2015 FINDINGS: There is slight left base atelectasis. Lungs elsewhere clear. Heart is upper normal in size with pulmonary vascularity within normal limits. Hiatal hernia present. Rod fixation in the thoracic and lumbar regions is stable. There is atherosclerotic calcification in the aorta. There is evidence of old trauma involving the proximal right humerus.  IMPRESSION: Slight left base atelectasis. No edema or consolidation. No change in cardiac silhouette. Hiatal hernia present. Electronically Signed   By: Lowella Grip III M.D.   On: 07/23/2015 07:04  Assessment/Plan 1. Essential hypertension SBP runs in 160's -170's for the past several days. Currently on Metoprolol 25mg  Bid will increase to 50mg  Tablet twice daily. Continue to monitor. Will consider adding diuretic if B/p continues to be elevated.   2. Urinary tract infection symptoms Afebrile. Reports Burning and pain with voiding. Ordering urine specimen for U/A and C/S.     Family/ staff Communication: Reviewed plan with Patient and facility supervisor.   Labs/tests ordered:   urine specimen for U/A and C/S.

## 2015-08-24 ENCOUNTER — Non-Acute Institutional Stay (SKILLED_NURSING_FACILITY): Payer: Medicare Other | Admitting: Family

## 2015-08-24 DIAGNOSIS — N390 Urinary tract infection, site not specified: Secondary | ICD-10-CM | POA: Diagnosis not present

## 2015-08-24 DIAGNOSIS — E039 Hypothyroidism, unspecified: Secondary | ICD-10-CM | POA: Diagnosis not present

## 2015-08-24 DIAGNOSIS — I5032 Chronic diastolic (congestive) heart failure: Secondary | ICD-10-CM

## 2015-08-24 DIAGNOSIS — I10 Essential (primary) hypertension: Secondary | ICD-10-CM | POA: Diagnosis not present

## 2015-08-24 DIAGNOSIS — K219 Gastro-esophageal reflux disease without esophagitis: Secondary | ICD-10-CM

## 2015-08-24 NOTE — Progress Notes (Signed)
Patient ID: [name], female   DOB: Apr 02, 1930, 82 y.o.   MRN: [id number]  Location:  [place]:  SNF (31) Provider:  [name], MD  Patient Care Team: [name], MD as PCP - General (Internal Medicine) [name], NP as Nurse Practitioner (Nurse Practitioner)  Extended Emergency Contact Information Primary Emergency Contact: Miskell,[name] Address: [id number], [place] Phone: [phone] Mobile Phone: [phone] Relation: Son Secondary Emergency Contact: [name] [place] Phone: [phone] Mobile Phone: [phone] Relation: Daughter  Code Status: Full code  Goals of care: Advanced Directive information Advanced Directives 08/10/2015  Does patient have an advance directive? Yes  Type of Advance Directive Healthcare Power of Attorney  Copy of advanced directive(s) in chart? -     Chief Complaint  Patient presents with  . Medical Management of Chronic Issues    HPI:  Pt is a 82 y.o. female seen today at [facility] for medical management of chronic diseases and review of labs. She has a medical history of  HTN, Hypothyroidism, CAD, DVT, CHF, Arthritis amongst others. She is seen in her room today. She complains of frequent voiding with some burning sensation at times. Her recent U/A and C/S showed negative nitrite, moderate leukocytes and greater than 100,000 colonies of Proteus Mirabilis. She denies any fever, chills or fatigue. Facility staff reports no changes in her mental status.     Past Medical History  Diagnosis Date  . Arthritis   . Right ear pain   . Renal insufficiency   . RA (rheumatoid arthritis) ([place])   . PUD (peptic ulcer disease)   . GI bleed 2005  . GERD (gastroesophageal reflux disease)   . Hiatal hernia   . Hypothyroidism   . Osteoporosis   . Depression   . Anxiety   . Coronary artery disease    a. 2012 s/p NSTEMI/Cath: 95% apical LAD dzs, otw nonobs dzs-->Med Rx;  b. 05/2011 MV: no ischemia, EF 79%, inf infarct- attenuation.  . CVA (cerebral vascular accident) ([place])   . TIA (transient ischemic attack)   . Fall   . DVT of lower extremity (deep venous thrombosis) ([place])   . HCAP (healthcare-associated pneumonia) 01/22/2013  . Enteritis due to Clostridium difficile 01/04/2013  . Sepsis ([place]) 12/19/2012  . Recurrent colitis due to Clostridium difficile 06/02/2012  . Essential hypertension   . Hyperlipidemia   . Fatigue   . Weight loss   . Hemorrhoids   . Malnutrition ([place])   . Dyspnea   . Elevated troponin     a. 10/2014 in setting of sepsis/?HCAP.  Marland Kitchen Chronic diastolic CHF (congestive heart failure) ([place])     a. 10/2014 Echo: EF 60-65%, mild LVH, grade 1 DD, dynamic obstruction @ rest - peak velocity of 271 cm/sec, peak grad of 88mmHg, PASP 47mmHg.  Marland Kitchen Obstructive hypertrophic cardiomyopathy ([place])     a. 10/2014 Echo: EF 60-65%, mild LVH, grade 1 DD, dynamic obstruction @ rest - peak velocity of 271 cm/sec, peak grad of 61mmHg, PASP 8mmHg.   Past Surgical History  Procedure Laterality Date  . Nstemi  06/2010  . Spinal fusion surgery    . Knee surgery    . Cardiac catheterization      SHOWED RUPTURE PLAQUE IN THE LAD. THE LAD IS NONOBSTRUCTIVE WITH ONLY 30-40% STENOSIS  . Back surgery    .  Breast surgery  1964    x3  . Ankle surgery    . Colonoscopy      Allergies  Allergen Reactions  . Biphosphate   . Codeine Swelling  . Morphine And Related Other (See Comments)    unknown  . Percocet [Oxycodone-Acetaminophen]     unknown  . Plavix [Clopidogrel Bisulfate] Other (See Comments)    RASH  . Sulfur Other (See Comments)    GI upset      Medication List       This list is accurate as of: 08/24/15  3:08 PM.  Always use your most recent med list.               ACT DRY MOUTH Lozg  Use as directed in the mouth or throat as needed (for dry mouth).     aspirin 81 MG  chewable tablet  Chew 81 mg by mouth daily.     calcium-vitamin D 500-200 MG-UNIT tablet  Commonly known as:  OSCAL WITH D  Take 1 tablet by mouth 3 (three) times daily.     CERTAVITE/ANTIOXIDANTS PO  Take 1 tablet by mouth daily.     cetirizine 10 MG tablet  Commonly known as:  ZYRTEC  Take 10 mg by mouth daily.     Cranberry 200 MG Caps  Take 2 capsules by mouth 2 (two) times daily.     enoxaparin 30 MG/0.3ML injection  Commonly known as:  LOVENOX  Inject 0.65 mLs (65 mg total) into the skin every 12 (twelve) hours.     esomeprazole 40 MG capsule  Commonly known as:  NEXIUM  Take 40 mg by mouth 2 (two) times daily before a meal. Take one capsule by mouth twice daily for stomach     ferrous sulfate 325 (65 FE) MG tablet  Take 325 mg by mouth 3 (three) times daily with meals.     Fish Oil 1200 MG Caps  Take 1,200 mg by mouth daily.     HYDROcodone-acetaminophen 5-325 MG tablet  Commonly known as:  NORCO/VICODIN  Take one tablet by mouth twice daily for pain. Take one tablet by mouth every 8 hours as needed for pain. Hold for sedation. Do not exceed 4gm of Tylenol in 24 hours     ipratropium 0.03 % nasal spray  Commonly known as:  ATROVENT  Place 2 sprays into both nostrils daily as needed (For asthma).     ipratropium-albuterol 0.5-2.5 (3) MG/3ML Soln  Commonly known as:  DUONEB  Take 3 mLs by nebulization every 8 (eight) hours as needed. For shortness of breath     levothyroxine 50 MCG tablet  Commonly known as:  SYNTHROID, LEVOTHROID  Take 50 mcg by mouth daily.     lidocaine 5 %  Commonly known as:  LIDODERM  Place 1 patch onto the skin daily. Remove & Discard patch within 12 hours or as directed by MD     LORazepam 0.5 MG tablet  Commonly known as:  ATIVAN  Take one tablet by mouth every night at bedtime for anxiety     methocarbamol 500 MG tablet  Commonly known as:  ROBAXIN  Take 500 mg by mouth at bedtime.     metoprolol tartrate 25 MG tablet    Commonly known as:  LOPRESSOR  Take 50 mg by mouth 2 (two) times daily.     montelukast 10 MG tablet  Commonly known as:  SINGULAIR  Take 10 mg by mouth at bedtime.  ondansetron 4 MG tablet  Commonly known as:  ZOFRAN  Take 1 tablet (4 mg total) by mouth 4 (four) times daily -  before meals and at bedtime. Take one tablet by mouth every 6 hours as needed for nausea/vomiting     PATADAY 0.2 % Soln  Generic drug:  Olopatadine HCl  Place 1 drop into both eyes daily.     predniSONE 10 MG tablet  Commonly known as:  DELTASONE  Take 10 mg by mouth daily.     promethazine 25 MG tablet  Commonly known as:  PHENERGAN  Take 25 mg by mouth 2 (two) times daily as needed for nausea.     rosuvastatin 20 MG tablet  Commonly known as:  CRESTOR  Take 20 mg by mouth every evening.     saccharomyces boulardii 250 MG capsule  Commonly known as:  FLORASTOR  Take 250 mg by mouth 2 (two) times daily.     sertraline 50 MG tablet  Commonly known as:  ZOLOFT  Take 50 mg by mouth every morning.     Vitamin D 2000 units Caps  Take 1 capsule (2,000 Units total) by mouth daily.     warfarin 4 MG tablet  Commonly known as:  COUMADIN  Take 1 tablet (4 mg total) by mouth daily.        Review of Systems  Constitutional: Negative for fever, chills, diaphoresis, activity change, appetite change and fatigue.  HENT: Negative for congestion and sore throat.   Eyes: Negative.   Respiratory: Negative.   Cardiovascular: Negative for chest pain, palpitations and leg swelling.  Gastrointestinal: Negative for nausea, vomiting, abdominal pain, diarrhea, constipation, blood in stool and abdominal distention.  Endocrine: Negative.   Genitourinary: Positive for frequency. Negative for dysuria, urgency, hematuria, flank pain, decreased urine volume, enuresis, difficulty urinating and dyspareunia.  Musculoskeletal: Negative for myalgias and back pain.  Skin: Negative.        Left leg previous bruise  resolved.   Allergic/Immunologic: Negative.   Neurological: Negative.   Hematological: Negative.   Psychiatric/Behavioral: Negative.     Immunization History  Administered Date(s) Administered  . Influenza-Unspecified 05/09/2013, 04/27/2014, 04/17/2015  . PPD Test 12/22/2012, 03/14/2014, 03/08/2015  . Pneumococcal-Unspecified 05/09/2013, 09/09/2013   Pertinent  Health Maintenance Due  Topic Date Due  . INFLUENZA VACCINE  01/29/2016  . DEXA SCAN  Completed  . PNA vac Low Risk Adult  Addressed   Fall Risk  10/04/2014  Falls in the past year? Yes  Number falls in past yr: 2 or more  Injury with Fall? No  Risk Factor Category  High Fall Risk  Risk for fall due to : Impaired balance/gait;Impaired mobility;Medication side effect;History of fall(s)  Follow up Education provided;Falls evaluation completed;Falls prevention discussed   Functional Status Survey:    Filed Vitals:   08/24/15 1440  BP: 138/74  Pulse: 81  Temp: 97.8 F (36.6 C)  Resp: 18  Weight: 140 lb 3.2 oz (63.594 kg)  SpO2: 95%   Body mass index is 24.05 kg/(m^2). Physical Exam  Constitutional: She is oriented to person, place, and time. She appears well-developed and well-nourished. No distress.  HENT:  Head: Normocephalic.  Right Ear: External ear normal.  Left Ear: External ear normal.  Mouth/Throat: Oropharynx is clear and moist.  Eyes: Conjunctivae and EOM are normal. Pupils are equal, round, and reactive to light. Right eye exhibits no discharge. Left eye exhibits no discharge. No scleral icterus.  Neck: Normal range of motion. No JVD  present. No tracheal deviation present. No thyromegaly present.  Cardiovascular: Normal rate, regular rhythm, normal heart sounds and intact distal pulses.  Exam reveals no gallop and no friction rub.   No murmur heard. Pulmonary/Chest: Effort normal and breath sounds normal. No respiratory distress. She has no wheezes. She has no rales. She exhibits no tenderness.    Abdominal: Soft. Bowel sounds are normal. She exhibits no distension and no mass. There is no tenderness. There is no rebound and no guarding.  Musculoskeletal: Normal range of motion. She exhibits no edema or tenderness.  Lymphadenopathy:    She has no cervical adenopathy.  Neurological: She is oriented to person, place, and time.  Skin: Skin is warm and dry. No rash noted. No erythema. No pallor.  Previous left leg bruise resolved.     Labs reviewed:  Recent Labs  10/29/14 1659  11/02/14 0846 11/03/14 0350  07/24/15 1122 07/25/15 0706 07/26/15 0655  NA 137  < > 140 136  < > 143 141 139  K 4.1  < > 3.4* 3.5  < > 3.0* 3.4* 3.3*  CL 104  < > 109 106  < > 107 109 108  CO2 13*  < > 24 23  < > 21* 21* 19*  GLUCOSE 352*  < > 89 100*  < > 132* 83 87  BUN 21*  < > 32* 28*  < > 20 18 12   CREATININE 1.80*  < > 1.54* 1.70*  < > 1.54* 1.52* 1.38*  CALCIUM 9.1  < > 8.0* 7.9*  < > 8.5* 8.4* 8.1*  MG 1.9  --  1.7 2.0  --   --  1.4*  --   PHOS 4.8*  --   --   --   --   --   --   --   < > = values in this interval not displayed.  Recent Labs  10/30/14 0414 07/22/15 1123 07/23/15 0226  AST 41 23 29  ALT 25 15 13*  ALKPHOS 48 66 46  BILITOT 0.9 1.1 0.8  PROT 6.7 6.3* 4.9*  ALBUMIN 3.3* 2.9* 2.1*    Recent Labs  10/29/14 1029  06/28/15 1555 07/22/15 1123  07/24/15 1122 07/25/15 0706 07/26/15 0655  WBC 14.1*  < > 13.9* 27.8*  < > 18.1* 17.5* 13.4*  NEUTROABS 11.4*  --  12.5* 24.0*  --   --   --   --   HGB 11.8*  < > 12.4 13.0  < > 10.0* 10.5* 10.4*  HCT 38.2  < > 38.2 39.7  < > 31.0* 32.6* 32.0*  MCV 90.5  < > 87.5 88.6  < > 88.8 89.6 87.9  PLT 110*  < > 173.0 116*  < > 204 128* 131*  < > = values in this interval not displayed. Lab Results  Component Value Date   TSH 0.708 07/22/2015   No results found for: HGBA1C Lab Results  Component Value Date   CHOL 115 03/28/2015   HDL 47 03/28/2015   LDLCALC 41 03/28/2015   LDLDIRECT 60.7 11/13/2010   TRIG 133 03/28/2015    CHOLHDL 2.9 12/02/2011    Significant Diagnostic Results in last 30 days:  No results found.  Assessment/Plan 1. Essential hypertension B/p has improved. Continue metoprolol 50 mg Tablet twice daily.  2. Chronic diastolic CHF (congestive heart failure) (HCC) No weight gain, edema , or  Shortness of breath. Continue on Metoprolol 50 mg Tablet. Continue daily weight. Monitor BnP  3. Gastroesophageal reflux disease without esophagitis Stable on Nexium.   4. Hypothyroidism, unspecified hypothyroidism type Continue on Levothyroxine 50 mcg Tablet last TSH 0.708( 07/22/2015). Monitor TSH level.   5. Urinary tract infection without hematuria, site unspecified Afebrile. She complains of frequent voiding with some burning sensation at times. Her recent U/A and C/S showed negative nitrite, moderate leukocytes and greater than 100,000 colonies of Proteus Mirabilis sensitive to Cipro. Will start Cipro 500 mg Tablet twice daily X 7 days and continue Florastor 250 mg Capsule twice daily X 10 days. Check INR daily while on Cipro.     Family/ staff Communication: Reviewed plan of care with patient and Facility Nurse supervisor.   Labs/tests ordered:  None

## 2015-08-29 DIAGNOSIS — D508 Other iron deficiency anemias: Secondary | ICD-10-CM | POA: Diagnosis not present

## 2015-08-29 LAB — HEPATIC FUNCTION PANEL
ALT: 16 U/L (ref 7–35)
AST: 19 U/L (ref 13–35)
Alkaline Phosphatase: 44 U/L (ref 25–125)
BILIRUBIN, TOTAL: 0.4 mg/dL

## 2015-08-29 LAB — BASIC METABOLIC PANEL
BUN: 21 mg/dL (ref 4–21)
Creatinine: 1.3 mg/dL — AB (ref 0.5–1.1)
GLUCOSE: 80 mg/dL
Potassium: 4.5 mmol/L (ref 3.4–5.3)
SODIUM: 144 mmol/L (ref 137–147)

## 2015-08-29 LAB — CBC AND DIFFERENTIAL
HEMATOCRIT: 30 % — AB (ref 36–46)
HEMOGLOBIN: 9.1 g/dL — AB (ref 12.0–16.0)
Platelets: 203 10*3/uL (ref 150–399)
WBC: 9 10^3/mL

## 2015-09-17 ENCOUNTER — Other Ambulatory Visit: Payer: Self-pay | Admitting: *Deleted

## 2015-09-17 MED ORDER — LORAZEPAM 0.5 MG PO TABS: ORAL_TABLET | ORAL | Status: DC

## 2015-09-17 NOTE — Telephone Encounter (Signed)
[facility] 

## 2015-09-19 ENCOUNTER — Encounter: Payer: Self-pay | Admitting: Internal Medicine

## 2015-09-19 ENCOUNTER — Non-Acute Institutional Stay (SKILLED_NURSING_FACILITY): Payer: Medicare Other | Admitting: Internal Medicine

## 2015-09-19 DIAGNOSIS — I10 Essential (primary) hypertension: Secondary | ICD-10-CM | POA: Diagnosis not present

## 2015-09-19 DIAGNOSIS — E038 Other specified hypothyroidism: Secondary | ICD-10-CM | POA: Diagnosis not present

## 2015-09-19 DIAGNOSIS — D638 Anemia in other chronic diseases classified elsewhere: Secondary | ICD-10-CM

## 2015-09-19 DIAGNOSIS — R233 Spontaneous ecchymoses: Secondary | ICD-10-CM

## 2015-09-19 DIAGNOSIS — R238 Other skin changes: Secondary | ICD-10-CM | POA: Diagnosis not present

## 2015-09-19 DIAGNOSIS — N390 Urinary tract infection, site not specified: Secondary | ICD-10-CM | POA: Diagnosis not present

## 2015-09-19 NOTE — Progress Notes (Signed)
Patient ID: [name], female   DOB: 1929/09/11, 82 y.o.   MRN: [id number]     [facility]  PCP: [name], MD  Code Status: Full Code   Allergies  Allergen Reactions  . Biphosphate   . Codeine Swelling  . Morphine And Related Other (See Comments)    unknown  . Percocet [Oxycodone-Acetaminophen]     unknown  . Plavix [Clopidogrel Bisulfate] Other (See Comments)    RASH  . Sulfur Other (See Comments)    GI upset    Chief Complaint  Patient presents with  . Medical Management of Chronic Issues    Routine Visit    Advanced Directives 08/10/2015  Does patient have an advance directive? Yes  Type of Advance Directive Healthcare Power of Attorney  Copy of advanced directive(s) in chart? -    HPI:  82 y.o. patient is seen for routine visit. She recently completed antibiotic treatment for UTI. Her hemoglobin level is stable. She denies any concern this visit. No fall reported. Per staff, has been having easy bruising. No acute behavioral changes reported.  Review of Systems:  Constitutional: Negative for fever, chills, diaphoresis.  HENT: Negative for headache, congestion, nasal discharge, difficulty swallowing.   Eyes: Negative for blurred vision, double vision and discharge.  Respiratory: Negative for cough, wheezing.  has chronic dyspnea with exertion. Breathing currently stable. Using oxygen only on need basis during daytime. Cardiovascular: Negative for chest pain, palpitations, leg swelling.  Gastrointestinal: Negative for heartburn, nausea, vomiting, abdominal pain.  Genitourinary: Negative for dysuria, flank pain.  Musculoskeletal: Negative for back pain, falls. Skin: Negative for itching, rash.  Neurological: positive for occasional dizziness Psychiatric/Behavioral: Negative for depression   Past Medical History  Diagnosis Date  . Arthritis   . Right ear pain   . Renal insufficiency   . RA (rheumatoid arthritis) ([place])   . PUD (peptic  ulcer disease)   . GI bleed 2005  . GERD (gastroesophageal reflux disease)   . Hiatal hernia   . Hypothyroidism   . Osteoporosis   . Depression   . Anxiety   . Coronary artery disease     a. 2012 s/p NSTEMI/Cath: 95% apical LAD dzs, otw nonobs dzs-->Med Rx;  b. 05/2011 MV: no ischemia, EF 79%, inf infarct- attenuation.  . CVA (cerebral vascular accident) ([place])   . TIA (transient ischemic attack)   . Fall   . DVT of lower extremity (deep venous thrombosis) ([place])   . HCAP (healthcare-associated pneumonia) 01/22/2013  . Enteritis due to Clostridium difficile 01/04/2013  . Sepsis ([place]) 12/19/2012  . Recurrent colitis due to Clostridium difficile 06/02/2012  . Essential hypertension   . Hyperlipidemia   . Fatigue   . Weight loss   . Hemorrhoids   . Malnutrition ([place])   . Dyspnea   . Elevated troponin     a. 10/2014 in setting of sepsis/?HCAP.  Marland Kitchen Chronic diastolic CHF (congestive heart failure) ([place])     a. 10/2014 Echo: EF 60-65%, mild LVH, grade 1 DD, dynamic obstruction @ rest - peak velocity of 271 cm/sec, peak grad of 9mmHg, PASP 63mmHg.  Marland Kitchen Obstructive hypertrophic cardiomyopathy ([place])     a. 10/2014 Echo: EF 60-65%, mild LVH, grade 1 DD, dynamic obstruction @ rest - peak velocity of 271 cm/sec, peak grad of 62mmHg, PASP 82mmHg.   Past Surgical History  Procedure Laterality Date  . Nstemi  06/2010  . Spinal fusion surgery    . Knee surgery    .  Cardiac catheterization      SHOWED RUPTURE PLAQUE IN THE LAD. THE LAD IS NONOBSTRUCTIVE WITH ONLY 30-40% STENOSIS  . Back surgery    . Breast surgery  1964    x3  . Ankle surgery    . Colonoscopy       Medications:   Medication List       This list is accurate as of: 09/19/15  4:53 PM.  Always use your most recent med list.               ACT DRY MOUTH Lozg  Use as directed in the mouth or throat as needed (for dry mouth).     aspirin 81 MG chewable tablet  Chew 81 mg by mouth daily.     calcium-vitamin D 500-200 MG-UNIT  tablet  Commonly known as:  OSCAL WITH D  Take 1 tablet by mouth 3 (three) times daily.     CERTAVITE/ANTIOXIDANTS PO  Take 1 tablet by mouth daily.     cetirizine 10 MG tablet  Commonly known as:  ZYRTEC  Take 10 mg by mouth daily.     Cranberry 200 MG Caps  Take 2 capsules by mouth 2 (two) times daily.     enoxaparin 30 MG/0.3ML injection  Commonly known as:  LOVENOX  Inject 0.65 mLs (65 mg total) into the skin every 12 (twelve) hours.     esomeprazole 40 MG capsule  Commonly known as:  NEXIUM  Take 40 mg by mouth 2 (two) times daily before a meal. Take one capsule by mouth twice daily for stomach     ferrous sulfate 325 (65 FE) MG tablet  Take 325 mg by mouth 3 (three) times daily with meals.     Fish Oil 1200 MG Caps  Take 1,200 mg by mouth daily.     HYDROcodone-acetaminophen 5-325 MG tablet  Commonly known as:  NORCO/VICODIN  Take 1 tablet by mouth 2 (two) times daily. Take 1 tablet by mouth every 8 hours as needed for breakthrough pain     ipratropium 0.03 % nasal spray  Commonly known as:  ATROVENT  Place 2 sprays into both nostrils daily as needed (For asthma).     ipratropium-albuterol 0.5-2.5 (3) MG/3ML Soln  Commonly known as:  DUONEB  Take 3 mLs by nebulization every 8 (eight) hours as needed. For shortness of breath     levothyroxine 50 MCG tablet  Commonly known as:  SYNTHROID, LEVOTHROID  Take 50 mcg by mouth daily.     lidocaine 5 %  Commonly known as:  LIDODERM  Place 1 patch onto the skin daily. Remove & Discard patch within 12 hours or as directed by MD     LORazepam 0.5 MG tablet  Commonly known as:  ATIVAN  Take one tablet by mouth every night at bedtime for anxiety     methocarbamol 500 MG tablet  Commonly known as:  ROBAXIN  Take 500 mg by mouth at bedtime.     metoprolol tartrate 25 MG tablet  Commonly known as:  LOPRESSOR  Take 50 mg by mouth 2 (two) times daily.     montelukast 10 MG tablet  Commonly known as:  SINGULAIR  Take  10 mg by mouth at bedtime.     ondansetron 4 MG tablet  Commonly known as:  ZOFRAN  Take 4 mg by mouth every 6 (six) hours as needed for nausea or vomiting.     PATADAY 0.2 % Soln  Generic drug:  Olopatadine HCl  Place 1 drop into both eyes daily.     predniSONE 10 MG tablet  Commonly known as:  DELTASONE  Take 10 mg by mouth daily.     promethazine 25 MG tablet  Commonly known as:  PHENERGAN  Take 25 mg by mouth 2 (two) times daily as needed for nausea.     rosuvastatin 20 MG tablet  Commonly known as:  CRESTOR  Take 20 mg by mouth every evening.     saccharomyces boulardii 250 MG capsule  Commonly known as:  FLORASTOR  Take 250 mg by mouth 2 (two) times daily.     sertraline 50 MG tablet  Commonly known as:  ZOLOFT  Take 50 mg by mouth every morning.     Vitamin D 2000 units Caps  Take 1 capsule (2,000 Units total) by mouth daily.         Physical Exam: Filed Vitals:   09/19/15 1639  BP: 117/55  Pulse: 87  Temp: 98 F (36.7 C)  TempSrc: Oral  Resp: 20  Height: 5\' 4"  (1.626 m)  Weight: 140 lb (63.504 kg)  SpO2: 94%   Body mass index is 24.02 kg/(m^2).  General- elderly female, well built, in no acute distress Head- normocephalic, atraumatic Nose- no maxillary or frontal sinus tenderness, no nasal discharge Throat- moist mucus membrane Eyes- PERRLA, EOMI, no pallor, no icterus, no discharge, normal conjunctiva, normal sclera Neck- no cervical lymphadenopathy Cardiovascular- normal s1,s2, no murmurs, no leg edema Respiratory- bilateral clear to auscultation, no wheeze, no rhonchi, no crackles, no use of accessory muscles, on o2 Abdomen- bowel sounds present, soft, non tender Musculoskeletal- able to move all 4 extremities, generalized weakness Neurological- no focal deficit, alert and oriented to person, place and time Skin- warm and dry, easy bruising Psychiatry- normal mood and affect    Labs reviewed: Basic Metabolic Panel:  Recent Labs   10/29/14 1659  11/02/14 0846 11/03/14 0350  07/24/15 1122 07/25/15 0706 07/26/15 0655 08/29/15  NA 137  < > 140 136  < > 143 141 139 144  K 4.1  < > 3.4* 3.5  < > 3.0* 3.4* 3.3* 4.5  CL 104  < > 109 106  < > 107 109 108  --   CO2 13*  < > 24 23  < > 21* 21* 19*  --   GLUCOSE 352*  < > 89 100*  < > 132* 83 87  --   BUN 21*  < > 32* 28*  < > 20 18 12 21   CREATININE 1.80*  < > 1.54* 1.70*  < > 1.54* 1.52* 1.38* 1.3*  CALCIUM 9.1  < > 8.0* 7.9*  < > 8.5* 8.4* 8.1*  --   MG 1.9  --  1.7 2.0  --   --  1.4*  --   --   PHOS 4.8*  --   --   --   --   --   --   --   --   < > = values in this interval not displayed. Liver Function Tests:  Recent Labs  10/30/14 0414 07/22/15 1123 07/23/15 0226 08/29/15  AST 41 23 29 19   ALT 25 15 13* 16  ALKPHOS 48 66 46 44  BILITOT 0.9 1.1 0.8  --   PROT 6.7 6.3* 4.9*  --   ALBUMIN 3.3* 2.9* 2.1*  --    No results for input(s): LIPASE, AMYLASE in the last 8760 hours. No results for input(s):  AMMONIA in the last 8760 hours. CBC:  Recent Labs  10/29/14 1029  06/28/15 1555 07/22/15 1123  07/24/15 1122 07/25/15 0706 07/26/15 0655 08/29/15  WBC 14.1*  < > 13.9* 27.8*  < > 18.1* 17.5* 13.4* 9.0  NEUTROABS 11.4*  --  12.5* 24.0*  --   --   --   --   --   HGB 11.8*  < > 12.4 13.0  < > 10.0* 10.5* 10.4* 9.1*  HCT 38.2  < > 38.2 39.7  < > 31.0* 32.6* 32.0* 30*  MCV 90.5  < > 87.5 88.6  < > 88.8 89.6 87.9  --   PLT 110*  < > 173.0 116*  < > 204 128* 131* 203  < > = values in this interval not displayed.  Lab Results  Component Value Date   TSH 0.708 07/22/2015      Assessment/Plan  Urinary tract infection Completed her antibiotics. Denies any symptom this visit. Encouraged hydration. Continue cranberry supplement  Anemia of chronic disease Improved Hb. Continue ferrous sulfate tid and monitor cbc periodically  Easy bruising Normal platelet count. Normal liver function. On chronic steroid which is likely contributing to this. Advised geri  sleeves but patient refuses.  HTN Continue lopressor 25 mg bid and monitor BP. Reviewed her BP, overall stable with SBP mostly below 140.  Hypothyroidism Continue synthroid 50 MCG daly, check TSH and free T4  Lab Results  Component Value Date   TSH 0.708 07/22/2015     Family/ staff Communication: reviewed care plan with patient and nursing supervisor    [name], MD  [facility] [phone] (Monday-Friday 8 am - 5 pm) [phone] (afterhours)

## 2015-09-20 DIAGNOSIS — N390 Urinary tract infection, site not specified: Secondary | ICD-10-CM | POA: Insufficient documentation

## 2015-09-20 DIAGNOSIS — R238 Other skin changes: Secondary | ICD-10-CM | POA: Insufficient documentation

## 2015-09-20 DIAGNOSIS — R233 Spontaneous ecchymoses: Secondary | ICD-10-CM | POA: Insufficient documentation

## 2015-09-20 DIAGNOSIS — E038 Other specified hypothyroidism: Secondary | ICD-10-CM | POA: Diagnosis not present

## 2015-09-20 LAB — TSH: TSH: 0.61 u[IU]/mL (ref 0.41–5.90)

## 2015-09-21 DIAGNOSIS — L821 Other seborrheic keratosis: Secondary | ICD-10-CM | POA: Diagnosis not present

## 2015-09-21 DIAGNOSIS — D692 Other nonthrombocytopenic purpura: Secondary | ICD-10-CM | POA: Diagnosis not present

## 2015-09-21 DIAGNOSIS — L720 Epidermal cyst: Secondary | ICD-10-CM | POA: Diagnosis not present

## 2015-10-08 ENCOUNTER — Other Ambulatory Visit: Payer: Self-pay

## 2015-10-08 MED ORDER — HYDROCODONE-ACETAMINOPHEN 5-325 MG PO TABS: 1.0000 | ORAL_TABLET | Freq: Two times a day (BID) | ORAL | Status: DC

## 2015-10-12 LAB — POCT INR: INR: 3.1 — AB (ref <=1.1)

## 2015-10-15 LAB — POCT INR: INR: 1.8 — AB (ref <=1.1)

## 2015-10-16 DIAGNOSIS — R300 Dysuria: Secondary | ICD-10-CM | POA: Diagnosis not present

## 2015-10-18 ENCOUNTER — Non-Acute Institutional Stay (SKILLED_NURSING_FACILITY): Payer: Medicare Other | Admitting: Family

## 2015-10-18 ENCOUNTER — Encounter: Payer: Self-pay | Admitting: Family

## 2015-10-18 DIAGNOSIS — F32A Depression, unspecified: Secondary | ICD-10-CM

## 2015-10-18 DIAGNOSIS — E039 Hypothyroidism, unspecified: Secondary | ICD-10-CM | POA: Diagnosis not present

## 2015-10-18 DIAGNOSIS — I130 Hypertensive heart and chronic kidney disease with heart failure and stage 1 through stage 4 chronic kidney disease, or unspecified chronic kidney disease: Secondary | ICD-10-CM | POA: Diagnosis not present

## 2015-10-18 DIAGNOSIS — F329 Major depressive disorder, single episode, unspecified: Secondary | ICD-10-CM

## 2015-10-18 DIAGNOSIS — M79671 Pain in right foot: Secondary | ICD-10-CM | POA: Diagnosis not present

## 2015-10-18 DIAGNOSIS — I5032 Chronic diastolic (congestive) heart failure: Secondary | ICD-10-CM

## 2015-10-18 DIAGNOSIS — B351 Tinea unguium: Secondary | ICD-10-CM | POA: Diagnosis not present

## 2015-10-18 DIAGNOSIS — K219 Gastro-esophageal reflux disease without esophagitis: Secondary | ICD-10-CM | POA: Diagnosis not present

## 2015-10-18 DIAGNOSIS — N183 Chronic kidney disease, stage 3 unspecified: Secondary | ICD-10-CM

## 2015-10-18 DIAGNOSIS — I5030 Unspecified diastolic (congestive) heart failure: Secondary | ICD-10-CM | POA: Diagnosis not present

## 2015-10-18 DIAGNOSIS — N3000 Acute cystitis without hematuria: Secondary | ICD-10-CM | POA: Diagnosis not present

## 2015-10-18 DIAGNOSIS — M79672 Pain in left foot: Secondary | ICD-10-CM | POA: Diagnosis not present

## 2015-10-18 DIAGNOSIS — E785 Hyperlipidemia, unspecified: Secondary | ICD-10-CM

## 2015-10-18 DIAGNOSIS — I2510 Atherosclerotic heart disease of native coronary artery without angina pectoris: Secondary | ICD-10-CM | POA: Diagnosis not present

## 2015-10-18 NOTE — Progress Notes (Signed)
Patient ID: [name], female   DOB: 05-28-1930, 82 y.o.   MRN: [id number]  Location:  [facility] Room Number: [id number] A Place of Service:  SNF (31) Provider:  Marlowe Sax, FNP-c   [name], MD  Patient Care Team: [name], MD as PCP - General (Internal Medicine) [name], NP as Nurse Practitioner (Nurse Practitioner)  Extended Emergency Contact Information Primary Emergency Contact: Stelmach,[name] Address: [id number], [place] Phone: [phone] Mobile Phone: [phone] Relation: Son Secondary Emergency Contact: [name] [place] Phone: [phone] Mobile Phone: [phone] Relation: Daughter  Code Status:  Full code  Goals of care: Advanced Directive information Advanced Directives 08/10/2015  Does patient have an advance directive? Yes  Type of Advance Directive Healthcare Power of Attorney  Copy of advanced directive(s) in chart? -     Chief Complaint  Patient presents with  . Medical Management of Chronic Issues    Routine Visit     HPI:  Pt is a 82 y.o. female seen today at [facility]  for medical management of chronic diseases. She has a significant medical history of HTN, CHF, Hypothyroidism, Hyperlipidemia, Depression among other conditions. She is seen in her room today. She complains of voiding frequently. Her recent urine specimen was Nitrite positive. Facility staff reports no new concerns.    Past Medical History  Diagnosis Date  . Arthritis   . Right ear pain   . Renal insufficiency   . RA (rheumatoid arthritis) ([place])   . PUD (peptic ulcer disease)   . GI bleed 2005  . GERD (gastroesophageal reflux disease)   . Hiatal hernia   . Hypothyroidism   . Osteoporosis   . Depression   . Anxiety   . Coronary artery disease     a. 2012 s/p NSTEMI/Cath: 95% apical LAD dzs, otw nonobs dzs-->Med Rx;  b. 05/2011 MV:  no ischemia, EF 79%, inf infarct- attenuation.  . CVA (cerebral vascular accident) ([place])   . TIA (transient ischemic attack)   . Fall   . DVT of lower extremity (deep venous thrombosis) ([place])   . HCAP (healthcare-associated pneumonia) 01/22/2013  . Enteritis due to Clostridium difficile 01/04/2013  . Sepsis ([place]) 12/19/2012  . Recurrent colitis due to Clostridium difficile 06/02/2012  . Essential hypertension   . Hyperlipidemia   . Fatigue   . Weight loss   . Hemorrhoids   . Malnutrition ([place])   . Dyspnea   . Elevated troponin     a. 10/2014 in setting of sepsis/?HCAP.  Marland Kitchen Chronic diastolic CHF (congestive heart failure) ([place])     a. 10/2014 Echo: EF 60-65%, mild LVH, grade 1 DD, dynamic obstruction @ rest - peak velocity of 271 cm/sec, peak grad of 49mmHg, PASP 36mmHg.  Marland Kitchen Obstructive hypertrophic cardiomyopathy ([place])     a. 10/2014 Echo: EF 60-65%, mild LVH, grade 1 DD, dynamic obstruction @ rest - peak velocity of 271 cm/sec, peak grad of 20mmHg, PASP 36mmHg.   Past Surgical History  Procedure Laterality Date  . Nstemi  06/2010  . Spinal fusion surgery    . Knee surgery    . Cardiac catheterization      SHOWED RUPTURE PLAQUE IN THE LAD. THE LAD IS NONOBSTRUCTIVE WITH ONLY 30-40% STENOSIS  . Back surgery    . Breast surgery  1964    x3  .  Ankle surgery    . Colonoscopy      Allergies  Allergen Reactions  . Biphosphate   . Codeine Swelling  . Morphine And Related Other (See Comments)    unknown  . Percocet [Oxycodone-Acetaminophen]     unknown  . Plavix [Clopidogrel Bisulfate] Other (See Comments)    RASH  . Sulfur Other (See Comments)    GI upset      Medication List       This list is accurate as of: 10/18/15  2:04 PM.  Always use your most recent med list.               ACT DRY MOUTH Lozg  Use as directed in the mouth or throat as needed (for dry mouth).     aspirin 81 MG chewable tablet  Chew 81 mg by mouth daily.     calcium-vitamin D 500-200 MG-UNIT  tablet  Commonly known as:  OSCAL WITH D  Take 1 tablet by mouth 3 (three) times daily.     CERTAVITE/ANTIOXIDANTS PO  Take 1 tablet by mouth daily.     cetirizine 10 MG tablet  Commonly known as:  ZYRTEC  Take 10 mg by mouth daily.     Cranberry 200 MG Caps  Take 2 capsules by mouth 2 (two) times daily.     enoxaparin 30 MG/0.3ML injection  Commonly known as:  LOVENOX  Inject 0.65 mLs (65 mg total) into the skin every 12 (twelve) hours.     esomeprazole 40 MG capsule  Commonly known as:  NEXIUM  Take 40 mg by mouth 2 (two) times daily before a meal. Take one capsule by mouth twice daily for stomach     ferrous sulfate 325 (65 FE) MG tablet  Take 325 mg by mouth 3 (three) times daily with meals.     Fish Oil 1200 MG Caps  Take 1,200 mg by mouth daily.     HYDROcodone-acetaminophen 5-325 MG tablet  Commonly known as:  NORCO/VICODIN  Take 1 tablet by mouth 2 (two) times daily. Take 1 tablet by mouth every 8 hours as needed for breakthrough pain     ipratropium 0.03 % nasal spray  Commonly known as:  ATROVENT  Place 2 sprays into both nostrils daily as needed (For asthma).     ipratropium-albuterol 0.5-2.5 (3) MG/3ML Soln  Commonly known as:  DUONEB  Take 3 mLs by nebulization every 8 (eight) hours as needed. For shortness of breath     levothyroxine 50 MCG tablet  Commonly known as:  SYNTHROID, LEVOTHROID  Take 50 mcg by mouth daily.     lidocaine 5 %  Commonly known as:  LIDODERM  Place 1 patch onto the skin daily. Remove & Discard patch within 12 hours or as directed by MD     LORazepam 0.5 MG tablet  Commonly known as:  ATIVAN  Take one tablet by mouth every night at bedtime for anxiety     methocarbamol 500 MG tablet  Commonly known as:  ROBAXIN  Take 500 mg by mouth at bedtime.     metoprolol 50 MG tablet  Commonly known as:  LOPRESSOR  Take 50 mg by mouth 2 (two) times daily.     montelukast 10 MG tablet  Commonly known as:  SINGULAIR  Take 10 mg by  mouth at bedtime.     ondansetron 4 MG tablet  Commonly known as:  ZOFRAN  Take 4 mg by mouth every 6 (six) hours as  needed for nausea or vomiting.     OXYGEN  Inhale 2 L into the lungs.     PATADAY 0.2 % Soln  Generic drug:  Olopatadine HCl  Place 1 drop into both eyes daily.     predniSONE 10 MG tablet  Commonly known as:  DELTASONE  Take 10 mg by mouth daily.     promethazine 25 MG tablet  Commonly known as:  PHENERGAN  Take 25 mg by mouth 2 (two) times daily as needed for nausea.     rosuvastatin 20 MG tablet  Commonly known as:  CRESTOR  Take 20 mg by mouth every evening.     saccharomyces boulardii 250 MG capsule  Commonly known as:  FLORASTOR  Take 250 mg by mouth 2 (two) times daily.     sertraline 50 MG tablet  Commonly known as:  ZOLOFT  Take 50 mg by mouth every morning.     Vitamin D 2000 units Caps  Take 1 capsule (2,000 Units total) by mouth daily.     warfarin 2.5 MG tablet  Commonly known as:  COUMADIN  Take 2.5 mg by mouth daily.        Review of Systems  Constitutional: Negative for fever, chills, diaphoresis, activity change, appetite change and fatigue.  HENT: Negative for congestion, rhinorrhea, sinus pressure and sore throat.   Eyes: Negative.   Respiratory: Negative.   Cardiovascular: Negative for chest pain, palpitations and leg swelling.  Gastrointestinal: Negative for nausea, vomiting, abdominal pain, diarrhea, constipation, blood in stool and abdominal distention.  Endocrine: Negative.   Genitourinary: Positive for frequency. Negative for dysuria, urgency, hematuria, flank pain, decreased urine volume, enuresis, difficulty urinating and dyspareunia.  Musculoskeletal: Positive for gait problem. Negative for myalgias and back pain.       Bilateral lower extremities strength 3/5   Skin: Negative.   Allergic/Immunologic: Negative.   Neurological: Negative for dizziness, seizures, syncope and headaches.  Hematological: Negative.     Psychiatric/Behavioral: Negative.     Immunization History  Administered Date(s) Administered  . Influenza-Unspecified 05/09/2013, 04/27/2014, 04/17/2015, 09/17/2015  . PPD Test 12/22/2012, 03/14/2014, 03/08/2015  . Pneumococcal-Unspecified 05/09/2013, 09/09/2013   Pertinent  Health Maintenance Due  Topic Date Due  . INFLUENZA VACCINE  01/29/2016  . DEXA SCAN  Completed  . PNA vac Low Risk Adult  Addressed   Fall Risk  10/04/2014  Falls in the past year? Yes  Number falls in past yr: 2 or more  Injury with Fall? No  Risk Factor Category  High Fall Risk  Risk for fall due to : Impaired balance/gait;Impaired mobility;Medication side effect;History of fall(s)  Follow up Education provided;Falls evaluation completed;Falls prevention discussed   Functional Status Survey:    Filed Vitals:   10/18/15 1344  BP: 160/68  Pulse: 72  Temp: 97.8 F (36.6 C)  TempSrc: Oral  Resp: 18  Height: 5\' 4"  (1.626 m)  Weight: 145 lb 6.4 oz (65.953 kg)  SpO2: 95%   Body mass index is 24.95 kg/(m^2). Physical Exam  Constitutional: She is oriented to person, place, and time. She appears well-developed and well-nourished. No distress.  HENT:  Head: Normocephalic.  Right Ear: External ear normal.  Left Ear: External ear normal.  Mouth/Throat: Oropharynx is clear and moist.  Eyes: Conjunctivae and EOM are normal. Pupils are equal, round, and reactive to light. Right eye exhibits no discharge. Left eye exhibits no discharge. No scleral icterus.  Neck: Normal range of motion. No JVD present. No tracheal deviation present.  No thyromegaly present.  Cardiovascular: Normal rate, regular rhythm, normal heart sounds and intact distal pulses.  Exam reveals no gallop and no friction rub.   No murmur heard. Pulmonary/Chest: Effort normal and breath sounds normal. No respiratory distress. She has no wheezes. She has no rales. She exhibits no tenderness.  Abdominal: Soft. Bowel sounds are normal. She  exhibits no distension and no mass. There is no tenderness. There is no rebound and no guarding.  Musculoskeletal: Normal range of motion. She exhibits no edema or tenderness.  Unsteady gait   Lymphadenopathy:    She has no cervical adenopathy.  Neurological: She is oriented to person, place, and time.  Skin: Skin is warm and dry. No rash noted. No erythema. No pallor.  Psychiatric: She has a normal mood and affect.    Labs reviewed:  Recent Labs  10/29/14 1659  11/02/14 0846 11/03/14 0350  07/24/15 1122 07/25/15 0706 07/26/15 0655 08/29/15  NA 137  < > 140 136  < > 143 141 139 144  K 4.1  < > 3.4* 3.5  < > 3.0* 3.4* 3.3* 4.5  CL 104  < > 109 106  < > 107 109 108  --   CO2 13*  < > 24 23  < > 21* 21* 19*  --   GLUCOSE 352*  < > 89 100*  < > 132* 83 87  --   BUN 21*  < > 32* 28*  < > 20 18 12 21   CREATININE 1.80*  < > 1.54* 1.70*  < > 1.54* 1.52* 1.38* 1.3*  CALCIUM 9.1  < > 8.0* 7.9*  < > 8.5* 8.4* 8.1*  --   MG 1.9  --  1.7 2.0  --   --  1.4*  --   --   PHOS 4.8*  --   --   --   --   --   --   --   --   < > = values in this interval not displayed.  Recent Labs  10/30/14 0414 07/22/15 1123 07/23/15 0226 08/29/15  AST 41 23 29 19   ALT 25 15 13* 16  ALKPHOS 48 66 46 44  BILITOT 0.9 1.1 0.8  --   PROT 6.7 6.3* 4.9*  --   ALBUMIN 3.3* 2.9* 2.1*  --     Recent Labs  10/29/14 1029  06/28/15 1555 07/22/15 1123  07/24/15 1122 07/25/15 0706 07/26/15 0655 08/29/15  WBC 14.1*  < > 13.9* 27.8*  < > 18.1* 17.5* 13.4* 9.0  NEUTROABS 11.4*  --  12.5* 24.0*  --   --   --   --   --   HGB 11.8*  < > 12.4 13.0  < > 10.0* 10.5* 10.4* 9.1*  HCT 38.2  < > 38.2 39.7  < > 31.0* 32.6* 32.0* 30*  MCV 90.5  < > 87.5 88.6  < > 88.8 89.6 87.9  --   PLT 110*  < > 173.0 116*  < > 204 128* 131* 203  < > = values in this interval not displayed. Lab Results  Component Value Date   TSH 0.61 09/20/2015   No results found for: HGBA1C Lab Results  Component Value Date   CHOL 115  03/28/2015   HDL 47 03/28/2015   LDLCALC 41 03/28/2015   LDLDIRECT 60.7 11/13/2010   TRIG 133 03/28/2015   CHOLHDL 2.9 12/02/2011    Significant Diagnostic Results in last 30 days:  No results found.  Assessment/Plan HTN B/p elevated this visit. Continue Metoprolol 50 mg Tablet. Monitor BMP   CHF Stable. No weight gain. Continue Metoprolol. Monitor weight.   Hypothyroidism  Continue on Levothyroxine 50 mcg Tablet. Monitor TSH level   Hyperlipidemia Continue rosuvastatin 20 mg tablet    Depression Continue on Zoloft 50 mg Tablet    GERD Asymptomatic. Continue on Nexium 40 mg capsule.   UTI  Urine specimen positive for UTI. Start Cipro 500 mg Tablet twice daily x 7 days. Florastor 250 capsule twice daily x 10 days for antibiotic associated diarrhea prevention.    Family/ staff Communication: Reviewed plan with patient and facility Nurse supervisor   Labs/tests ordered: None

## 2015-10-22 ENCOUNTER — Other Ambulatory Visit: Payer: Self-pay | Admitting: *Deleted

## 2015-10-22 MED ORDER — HYDROCODONE-ACETAMINOPHEN 5-325 MG PO TABS: ORAL_TABLET | ORAL | Status: DC

## 2015-10-22 NOTE — Telephone Encounter (Signed)
[facility] 

## 2015-10-23 ENCOUNTER — Other Ambulatory Visit: Payer: Self-pay

## 2015-10-23 MED ORDER — HYDROCODONE-ACETAMINOPHEN 5-325 MG PO TABS: ORAL_TABLET | ORAL | Status: DC

## 2015-10-23 NOTE — Telephone Encounter (Signed)
[facility]  [address] [place] [address]  Phone: [phone]  Fax: [phone]  

## 2015-10-26 ENCOUNTER — Encounter: Payer: Self-pay | Admitting: Internal Medicine

## 2015-10-26 ENCOUNTER — Non-Acute Institutional Stay (SKILLED_NURSING_FACILITY): Payer: Medicare Other | Admitting: Internal Medicine

## 2015-10-26 DIAGNOSIS — R238 Other skin changes: Secondary | ICD-10-CM

## 2015-10-26 DIAGNOSIS — I824Y2 Acute embolism and thrombosis of unspecified deep veins of left proximal lower extremity: Secondary | ICD-10-CM

## 2015-10-26 DIAGNOSIS — R233 Spontaneous ecchymoses: Secondary | ICD-10-CM

## 2015-10-26 DIAGNOSIS — D638 Anemia in other chronic diseases classified elsewhere: Secondary | ICD-10-CM | POA: Diagnosis not present

## 2015-10-26 DIAGNOSIS — Z7901 Long term (current) use of anticoagulants: Secondary | ICD-10-CM | POA: Diagnosis not present

## 2015-10-26 NOTE — Progress Notes (Signed)
Patient ID: [name], female   DOB: 05-21-1930, 82 y.o.   MRN: [id number]     [facility]  PCP: [name], MD  Code Status: Full Code   Allergies  Allergen Reactions  . Biphosphate   . Codeine Swelling  . Morphine And Related Other (See Comments)    unknown  . Percocet [Oxycodone-Acetaminophen]     unknown  . Plavix [Clopidogrel Bisulfate] Other (See Comments)    RASH  . Sulfur Other (See Comments)    GI upset    Chief Complaint  Patient presents with  . Acute Visit    Advanced Directives 08/10/2015  Does patient have an advance directive? Yes  Type of Advance Directive Healthcare Power of Attorney  Copy of advanced directive(s) in chart? -    HPI:  82 y.o. patient is seen for acute visit. She is currently on coumadin for anticoagulation with her DVT. she would like to be on another oral anticoagulation and avoid INR check. Her daughter would like her to be on xarelto.    Review of Systems:  Constitutional: Negative for fever  HENT: Negative for headache, congestion Respiratory: Negative for cough, wheezing.  has chronic dyspnea with exertion. Breathing currently stable. Using oxygen only on need basis during daytime. Cardiovascular: Negative for chest pain, palpitations, leg swelling.  Gastrointestinal: Negative for heartburn, nausea, vomiting, abdominal pain.  Genitourinary: Negative for dysuria, flank pain.  Musculoskeletal: Negative for back pain, fall. Skin: Negative for itching, rash.  Neurological: negative for dizziness Psychiatric/Behavioral: Negative for depression   Past Medical History  Diagnosis Date  . Arthritis   . Right ear pain   . Renal insufficiency   . RA (rheumatoid arthritis) ([place])   . PUD (peptic ulcer disease)   . GI bleed 2005  . GERD (gastroesophageal reflux disease)   . Hiatal hernia   . Hypothyroidism   . Osteoporosis   . Depression   . Anxiety   . Coronary artery disease     a. 2012 s/p  NSTEMI/Cath: 95% apical LAD dzs, otw nonobs dzs-->Med Rx;  b. 05/2011 MV: no ischemia, EF 79%, inf infarct- attenuation.  . CVA (cerebral vascular accident) ([place])   . TIA (transient ischemic attack)   . Fall   . DVT of lower extremity (deep venous thrombosis) ([place])   . HCAP (healthcare-associated pneumonia) 01/22/2013  . Enteritis due to Clostridium difficile 01/04/2013  . Sepsis ([place]) 12/19/2012  . Recurrent colitis due to Clostridium difficile 06/02/2012  . Essential hypertension   . Hyperlipidemia   . Fatigue   . Weight loss   . Hemorrhoids   . Malnutrition ([place])   . Dyspnea   . Elevated troponin     a. 10/2014 in setting of sepsis/?HCAP.  Marland Kitchen Chronic diastolic CHF (congestive heart failure) ([place])     a. 10/2014 Echo: EF 60-65%, mild LVH, grade 1 DD, dynamic obstruction @ rest - peak velocity of 271 cm/sec, peak grad of 79mmHg, PASP 85mmHg.  Marland Kitchen Obstructive hypertrophic cardiomyopathy ([place])     a. 10/2014 Echo: EF 60-65%, mild LVH, grade 1 DD, dynamic obstruction @ rest - peak velocity of 271 cm/sec, peak grad of 71mmHg, PASP 82mmHg.   Past Surgical History  Procedure Laterality Date  . Nstemi  06/2010  . Spinal fusion surgery    . Knee surgery    . Cardiac catheterization      SHOWED RUPTURE PLAQUE IN THE LAD. THE LAD IS NONOBSTRUCTIVE WITH ONLY 30-40% STENOSIS  . Back surgery    .  Breast surgery  1964    x3  . Ankle surgery    . Colonoscopy       Medications:   Medication List       This list is accurate as of: 10/26/15  2:26 PM.  Always use your most recent med list.               ACT DRY MOUTH Lozg  Use as directed in the mouth or throat as needed (for dry mouth).     aspirin 81 MG chewable tablet  Chew 81 mg by mouth daily.     calcium-vitamin D 500-200 MG-UNIT tablet  Commonly known as:  OSCAL WITH D  Take 1 tablet by mouth 3 (three) times daily.     CERTAVITE/ANTIOXIDANTS PO  Take 1 tablet by mouth daily.     cetirizine 10 MG tablet  Commonly known as:   ZYRTEC  Take 10 mg by mouth daily.     Cranberry 200 MG Caps  Take 2 capsules by mouth 2 (two) times daily.     esomeprazole 40 MG capsule  Commonly known as:  NEXIUM  Take 40 mg by mouth 2 (two) times daily before a meal. Take one capsule by mouth twice daily for stomach     ferrous sulfate 325 (65 FE) MG tablet  Take 325 mg by mouth 3 (three) times daily with meals.     Fish Oil 1200 MG Caps  Take 1,200 mg by mouth daily.     HYDROcodone-acetaminophen 5-325 MG tablet  Commonly known as:  NORCO/VICODIN  Take one tablet by mouth twice daily for pain. Do not exceed 4gm of Tylenol in 24 hours     ipratropium 0.03 % nasal spray  Commonly known as:  ATROVENT  Place 2 sprays into both nostrils daily as needed (For asthma).     ipratropium-albuterol 0.5-2.5 (3) MG/3ML Soln  Commonly known as:  DUONEB  Take 3 mLs by nebulization every 8 (eight) hours as needed. For shortness of breath     levothyroxine 50 MCG tablet  Commonly known as:  SYNTHROID, LEVOTHROID  Take 50 mcg by mouth daily.     lidocaine 5 %  Commonly known as:  LIDODERM  Place 1 patch onto the skin daily. Remove & Discard patch within 12 hours or as directed by MD     LORazepam 0.5 MG tablet  Commonly known as:  ATIVAN  Take one tablet by mouth every night at bedtime for anxiety     methocarbamol 500 MG tablet  Commonly known as:  ROBAXIN  Take 500 mg by mouth at bedtime.     metoprolol 50 MG tablet  Commonly known as:  LOPRESSOR  Take 50 mg by mouth 2 (two) times daily.     montelukast 10 MG tablet  Commonly known as:  SINGULAIR  Take 10 mg by mouth at bedtime.     ondansetron 4 MG tablet  Commonly known as:  ZOFRAN  Take 4 mg by mouth every 6 (six) hours as needed for nausea or vomiting.     OXYGEN  Inhale 2 L into the lungs.     PATADAY 0.2 % Soln  Generic drug:  Olopatadine HCl  Place 1 drop into both eyes daily.     predniSONE 10 MG tablet  Commonly known as:  DELTASONE  Take 10 mg by mouth  daily.     promethazine 25 MG tablet  Commonly known as:  PHENERGAN  Take 25 mg by  mouth 2 (two) times daily as needed for nausea.     rosuvastatin 20 MG tablet  Commonly known as:  CRESTOR  Take 20 mg by mouth every evening.     saccharomyces boulardii 250 MG capsule  Commonly known as:  FLORASTOR  Take 250 mg by mouth 2 (two) times daily.     sertraline 50 MG tablet  Commonly known as:  ZOLOFT  Take 50 mg by mouth every morning.     Vitamin D 2000 units Caps  Take 1 capsule (2,000 Units total) by mouth daily.     warfarin 2.5 MG tablet  Commonly known as:  COUMADIN  Take 2.5 mg by mouth daily.         Physical Exam: Filed Vitals:   10/26/15 1228  BP: 160/88  Pulse: 79  Temp: 97.9 F (36.6 C)  TempSrc: Oral  Resp: 19  Height: 5\' 6"  (1.676 m)  Weight: 142 lb 9.6 oz (64.683 kg)  SpO2: 98%   Body mass index is 23.03 kg/(m^2).  General- elderly female, well built, in no acute distress Head- normocephalic, atraumatic Throat- moist mucus membrane Eyes- PERRLA, EOMI, no pallor, no icterus Neck- no cervical lymphadenopathy Cardiovascular- normal s1,s2, no murmurs, no leg edema Respiratory- bilateral clear to auscultation, no wheeze, no rhonchi, no crackles, no use of accessory muscles, on o2 Abdomen- bowel sounds present, soft, non tender Musculoskeletal- able to move all 4 extremities, generalized weakness Neurological- alert and oriented to person, place and time Skin- warm and dry, easy bruising Psychiatry- normal mood and affect    Labs reviewed: Basic Metabolic Panel:  Recent Labs  10/29/14 1659  11/02/14 0846 11/03/14 0350  07/24/15 1122 07/25/15 0706 07/26/15 0655 08/29/15  NA 137  < > 140 136  < > 143 141 139 144  K 4.1  < > 3.4* 3.5  < > 3.0* 3.4* 3.3* 4.5  CL 104  < > 109 106  < > 107 109 108  --   CO2 13*  < > 24 23  < > 21* 21* 19*  --   GLUCOSE 352*  < > 89 100*  < > 132* 83 87  --   BUN 21*  < > 32* 28*  < > 20 18 12 21   CREATININE  1.80*  < > 1.54* 1.70*  < > 1.54* 1.52* 1.38* 1.3*  CALCIUM 9.1  < > 8.0* 7.9*  < > 8.5* 8.4* 8.1*  --   MG 1.9  --  1.7 2.0  --   --  1.4*  --   --   PHOS 4.8*  --   --   --   --   --   --   --   --   < > = values in this interval not displayed. Liver Function Tests:  Recent Labs  10/30/14 0414 07/22/15 1123 07/23/15 0226 08/29/15  AST 41 23 29 19   ALT 25 15 13* 16  ALKPHOS 48 66 46 44  BILITOT 0.9 1.1 0.8  --   PROT 6.7 6.3* 4.9*  --   ALBUMIN 3.3* 2.9* 2.1*  --    No results for input(s): LIPASE, AMYLASE in the last 8760 hours. No results for input(s): AMMONIA in the last 8760 hours. CBC:  Recent Labs  10/29/14 1029  06/28/15 1555 07/22/15 1123  07/24/15 1122 07/25/15 0706 07/26/15 0655 08/29/15  WBC 14.1*  < > 13.9* 27.8*  < > 18.1* 17.5* 13.4* 9.0  NEUTROABS 11.4*  --  12.5* 24.0*  --   --   --   --   --   HGB 11.8*  < > 12.4 13.0  < > 10.0* 10.5* 10.4* 9.1*  HCT 38.2  < > 38.2 39.7  < > 31.0* 32.6* 32.0* 30*  MCV 90.5  < > 87.5 88.6  < > 88.8 89.6 87.9  --   PLT 110*  < > 173.0 116*  < > 204 128* 131* 203  < > = values in this interval not displayed.  Lab Results  Component Value Date   TSH 0.61 09/20/2015      Assessment/Plan  Left leg DVT Currently on warfarin. D/c warfarin per patient request. inr today 1.8. Start eliquis 10 mg bid x 1 week, then 5 mg bid and monitor. Given her history of recurrent dvt, will need this lifelong. Reviewed side effect of eliquis with patient.   Long term anticoagulation With history of DVT and recent DVT to left leg. Discussed with patient about xarelto given her ckd stage 3. She agrees to initiate eliquis as above. Also documented in ED note that patient has had side effect with xarelto in past.   Anemia of chronic disease Continue ferrous sulfate and monitor cbc with her on anticoagulation  Easy bruising Normal platelet count. Normal liver function. On chronic steroid and anticoagulation which is likely contributing  to this. Continue skin care.    Family/ staff Communication: reviewed care plan with patient and nursing supervisor    [name], MD Internal Medicine [place], [place] [address] Cell Phone (Monday-Friday 8 am - 5 pm): [phone] On Call: [phone] and follow prompts after 5 pm and on weekends Office Phone: [phone] Office Fax: [phone]

## 2015-10-29 ENCOUNTER — Ambulatory Visit: Payer: Medicare Other | Admitting: Diagnostic Neuroimaging

## 2015-11-13 ENCOUNTER — Non-Acute Institutional Stay (SKILLED_NURSING_FACILITY): Payer: Medicare Other | Admitting: Family

## 2015-11-13 DIAGNOSIS — I2510 Atherosclerotic heart disease of native coronary artery without angina pectoris: Secondary | ICD-10-CM

## 2015-11-13 DIAGNOSIS — E039 Hypothyroidism, unspecified: Secondary | ICD-10-CM | POA: Diagnosis not present

## 2015-11-13 DIAGNOSIS — I130 Hypertensive heart and chronic kidney disease with heart failure and stage 1 through stage 4 chronic kidney disease, or unspecified chronic kidney disease: Secondary | ICD-10-CM | POA: Diagnosis not present

## 2015-11-13 DIAGNOSIS — N183 Chronic kidney disease, stage 3 unspecified: Secondary | ICD-10-CM

## 2015-11-13 DIAGNOSIS — I5032 Chronic diastolic (congestive) heart failure: Secondary | ICD-10-CM

## 2015-11-13 DIAGNOSIS — I5030 Unspecified diastolic (congestive) heart failure: Secondary | ICD-10-CM

## 2015-11-13 DIAGNOSIS — K219 Gastro-esophageal reflux disease without esophagitis: Secondary | ICD-10-CM | POA: Diagnosis not present

## 2015-11-13 NOTE — Progress Notes (Signed)
Patient ID: [name], female   DOB: March 28, 1930, 82 y.o.   MRN: [id number]  Location:  [place]:  SNF (31) Provider:  Nyelli Samara  FNP-C   [name], MD  Patient Care Team: [name], MD as PCP - General (Internal Medicine) [name], NP as Nurse Practitioner (Nurse Practitioner)  Extended Emergency Contact Information Primary Emergency Contact: Squillace,[name] Address: [id number], [place] Phone: [phone] Mobile Phone: [phone] Relation: Son Secondary Emergency Contact: [name] [place] of [place] Phone: [phone] Mobile Phone: [phone] Relation: Daughter  Code Status:  Full Code  Goals of care: Advanced Directive information Advanced Directives 08/10/2015  Does patient have an advance directive? Yes  Type of Advance Directive Healthcare Power of Attorney  Copy of advanced directive(s) in chart? -     Chief Complaint  Patient presents with  . Medical Management of Chronic Issues    HPI:  Pt is a 82 y.o. female seen today at [facility] for medical management of chronic diseases.She has a medical history of HTN, CAD, CHF, GERD, hypothyroidism, CKD stage 3, Arthritis among other conditions. She is seen in her room today. She denies any acute issues this visit. She continues to self propel on her wheelchair. She has had no recent fall episodes or hospital admission. Facility Nurse reports no new concerns.    Past Medical History  Diagnosis Date  . Arthritis   . Right ear pain   . Renal insufficiency   . RA (rheumatoid arthritis) ([place])   . PUD (peptic ulcer disease)   . GI bleed 2005  . GERD (gastroesophageal reflux disease)   . Hiatal hernia   . Hypothyroidism   . Osteoporosis   . Depression   . Anxiety   . Coronary artery disease     a. 2012 s/p NSTEMI/Cath: 95% apical LAD dzs, otw nonobs dzs-->Med Rx;  b. 05/2011  MV: no ischemia, EF 79%, inf infarct- attenuation.  . CVA (cerebral vascular accident) ([place])   . TIA (transient ischemic attack)   . Fall   . DVT of lower extremity (deep venous thrombosis) ([place])   . HCAP (healthcare-associated pneumonia) 01/22/2013  . Enteritis due to Clostridium difficile 01/04/2013  . Sepsis ([place]) 12/19/2012  . Recurrent colitis due to Clostridium difficile 06/02/2012  . Essential hypertension   . Hyperlipidemia   . Fatigue   . Weight loss   . Hemorrhoids   . Malnutrition ([place])   . Dyspnea   . Elevated troponin     a. 10/2014 in setting of sepsis/?HCAP.  Marland Kitchen Chronic diastolic CHF (congestive heart failure) ([place])     a. 10/2014 Echo: EF 60-65%, mild LVH, grade 1 DD, dynamic obstruction @ rest - peak velocity of 271 cm/sec, peak grad of 4mmHg, PASP 71mmHg.  Marland Kitchen Obstructive hypertrophic cardiomyopathy ([place])     a. 10/2014 Echo: EF 60-65%, mild LVH, grade 1 DD, dynamic obstruction @ rest - peak velocity of 271 cm/sec, peak grad of 74mmHg, PASP 32mmHg.   Past Surgical History  Procedure Laterality Date  . Nstemi  06/2010  . Spinal fusion surgery    . Knee surgery    . Cardiac catheterization      SHOWED RUPTURE PLAQUE IN THE LAD. THE LAD IS NONOBSTRUCTIVE WITH ONLY 30-40% STENOSIS  . Back surgery    . Breast surgery  1964  x3  . Ankle surgery    . Colonoscopy      Allergies  Allergen Reactions  . Biphosphate   . Codeine Swelling  . Morphine And Related Other (See Comments)    unknown  . Percocet [Oxycodone-Acetaminophen]     unknown  . Plavix [Clopidogrel Bisulfate] Other (See Comments)    RASH  . Sulfur Other (See Comments)    GI upset      Medication List       This list is accurate as of: 11/13/15  6:32 PM.  Always use your most recent med list.               ACT DRY MOUTH Lozg  Use as directed in the mouth or throat as needed (for dry mouth).     aspirin 81 MG chewable tablet  Chew 81 mg by mouth daily.     calcium-vitamin D 500-200 MG-UNIT  tablet  Commonly known as:  OSCAL WITH D  Take 1 tablet by mouth 3 (three) times daily.     CERTAVITE/ANTIOXIDANTS PO  Take 1 tablet by mouth daily.     cetirizine 10 MG tablet  Commonly known as:  ZYRTEC  Take 10 mg by mouth daily.     Cranberry 200 MG Caps  Take 2 capsules by mouth 2 (two) times daily.     ELIQUIS 5 MG Tabs tablet  Generic drug:  apixaban  Take 5 mg by mouth 2 (two) times daily.     esomeprazole 40 MG capsule  Commonly known as:  NEXIUM  Take 40 mg by mouth 2 (two) times daily before a meal. Take one capsule by mouth twice daily for stomach     ferrous sulfate 325 (65 FE) MG tablet  Take 325 mg by mouth 3 (three) times daily with meals.     Fish Oil 1200 MG Caps  Take 1,200 mg by mouth daily.     HYDROcodone-acetaminophen 5-325 MG tablet  Commonly known as:  NORCO/VICODIN  Take one tablet by mouth twice daily for pain. Do not exceed 4gm of Tylenol in 24 hours     ipratropium 0.03 % nasal spray  Commonly known as:  ATROVENT  Place 2 sprays into both nostrils daily as needed (For asthma).     ipratropium-albuterol 0.5-2.5 (3) MG/3ML Soln  Commonly known as:  DUONEB  Take 3 mLs by nebulization every 8 (eight) hours as needed. For shortness of breath     levothyroxine 50 MCG tablet  Commonly known as:  SYNTHROID, LEVOTHROID  Take 50 mcg by mouth daily.     lidocaine 5 %  Commonly known as:  LIDODERM  Place 1 patch onto the skin daily. Remove & Discard patch within 12 hours or as directed by MD     LORazepam 0.5 MG tablet  Commonly known as:  ATIVAN  Take one tablet by mouth every night at bedtime for anxiety     methocarbamol 500 MG tablet  Commonly known as:  ROBAXIN  Take 500 mg by mouth at bedtime.     metoprolol 50 MG tablet  Commonly known as:  LOPRESSOR  Take 50 mg by mouth 2 (two) times daily.     montelukast 10 MG tablet  Commonly known as:  SINGULAIR  Take 10 mg by mouth at bedtime.     ondansetron 4 MG tablet  Commonly known  as:  ZOFRAN  Take 4 mg by mouth every 6 (six) hours as needed for nausea or vomiting.  OXYGEN  Inhale 2 L into the lungs.     PATADAY 0.2 % Soln  Generic drug:  Olopatadine HCl  Place 1 drop into both eyes daily.     predniSONE 10 MG tablet  Commonly known as:  DELTASONE  Take 10 mg by mouth daily.     promethazine 25 MG tablet  Commonly known as:  PHENERGAN  Take 25 mg by mouth 2 (two) times daily as needed for nausea.     rosuvastatin 20 MG tablet  Commonly known as:  CRESTOR  Take 20 mg by mouth every evening.     saccharomyces boulardii 250 MG capsule  Commonly known as:  FLORASTOR  Take 250 mg by mouth 2 (two) times daily.     sertraline 50 MG tablet  Commonly known as:  ZOLOFT  Take 50 mg by mouth every morning.     Vitamin D 2000 units Caps  Take 1 capsule (2,000 Units total) by mouth daily.        Review of Systems  Constitutional: Negative for activity change, appetite change, chills, diaphoresis, fatigue and fever.  HENT: Negative for congestion, rhinorrhea, sinus pressure and sore throat.   Eyes: Negative.   Respiratory: Negative for cough, chest tightness, shortness of breath and wheezing.   Cardiovascular: Negative for chest pain, palpitations and leg swelling.  Gastrointestinal: Negative for abdominal distention, abdominal pain, blood in stool, constipation, diarrhea, nausea and vomiting.  Endocrine: Negative.   Genitourinary: Negative for decreased urine volume, difficulty urinating, dyspareunia, dysuria, enuresis, flank pain, frequency, hematuria and urgency.  Musculoskeletal: Positive for gait problem. Negative for back pain and myalgias.       Bilateral lower extremities weakness    Skin: Negative for color change, pallor, rash and wound.  Allergic/Immunologic: Negative.   Neurological: Negative for dizziness, seizures, syncope and headaches.  Hematological: Negative.   Psychiatric/Behavioral: Negative for agitation, confusion and sleep  disturbance.    Immunization History  Administered Date(s) Administered  . Influenza-Unspecified 05/09/2013, 04/27/2014, 04/17/2015, 09/17/2015  . PPD Test 12/22/2012, 03/14/2014, 03/08/2015  . Pneumococcal-Unspecified 05/09/2013, 09/09/2013   Pertinent  Health Maintenance Due  Topic Date Due  . INFLUENZA VACCINE  01/29/2016  . DEXA SCAN  Completed  . PNA vac Low Risk Adult  Addressed   Fall Risk  10/04/2014  Falls in the past year? Yes  Number falls in past yr: 2 or more  Injury with Fall? No  Risk Factor Category  High Fall Risk  Risk for fall due to : Impaired balance/gait;Impaired mobility;Medication side effect;History of fall(s)  Follow up Education provided;Falls evaluation completed;Falls prevention discussed      Filed Vitals:   11/13/15 1815  BP: 136/70  Pulse: 78  Temp: 97 F (36.1 C)  Resp: 18  Height: 5\' 4"  (1.626 m)  Weight: 140 lb 12.8 oz (63.866 kg)  SpO2: 98%   Body mass index is 24.16 kg/(m^2). Physical Exam  Constitutional: She is oriented to person, place, and time. She appears well-developed and well-nourished. No distress.  HENT:  Head: Normocephalic.  Right Ear: External ear normal.  Left Ear: External ear normal.  Mouth/Throat: Oropharynx is clear and moist.  Eyes: Conjunctivae and EOM are normal. Pupils are equal, round, and reactive to light. Right eye exhibits no discharge. Left eye exhibits no discharge. No scleral icterus.  Neck: Normal range of motion. No JVD present. No tracheal deviation present. No thyromegaly present.  Cardiovascular: Normal rate, regular rhythm, normal heart sounds and intact distal pulses.  Exam reveals  no gallop and no friction rub.   No murmur heard. Pulmonary/Chest: Effort normal and breath sounds normal. No respiratory distress. She has no wheezes. She has no rales. She exhibits no tenderness.  Abdominal: Soft. Bowel sounds are normal. She exhibits no distension and no mass. There is no tenderness. There is no  rebound and no guarding.  Musculoskeletal: Normal range of motion. She exhibits no edema or tenderness.  Unsteady gait. Self propels on wheelchair. Bilateral lower extremities weakness   Lymphadenopathy:    She has no cervical adenopathy.  Neurological: She is oriented to person, place, and time.  Skin: Skin is warm and dry. No rash noted. No erythema. No pallor.  Psychiatric: She has a normal mood and affect.    Labs reviewed:  Recent Labs  07/24/15 1122 07/25/15 0706 07/26/15 0655 08/29/15  NA 143 141 139 144  K 3.0* 3.4* 3.3* 4.5  CL 107 109 108  --   CO2 21* 21* 19*  --   GLUCOSE 132* 83 87  --   BUN 20 18 12 21   CREATININE 1.54* 1.52* 1.38* 1.3*  CALCIUM 8.5* 8.4* 8.1*  --   MG  --  1.4*  --   --     Recent Labs  07/22/15 1123 07/23/15 0226 08/29/15  AST 23 29 19   ALT 15 13* 16  ALKPHOS 66 46 44  BILITOT 1.1 0.8  --   PROT 6.3* 4.9*  --   ALBUMIN 2.9* 2.1*  --     Recent Labs  06/28/15 1555 07/22/15 1123  07/24/15 1122 07/25/15 0706 07/26/15 0655 08/29/15  WBC 13.9* 27.8*  < > 18.1* 17.5* 13.4* 9.0  NEUTROABS 12.5* 24.0*  --   --   --   --   --   HGB 12.4 13.0  < > 10.0* 10.5* 10.4* 9.1*  HCT 38.2 39.7  < > 31.0* 32.6* 32.0* 30*  MCV 87.5 88.6  < > 88.8 89.6 87.9  --   PLT 173.0 116*  < > 204 128* 131* 203  < > = values in this interval not displayed. Lab Results  Component Value Date   TSH 0.61 09/20/2015   No results found for: HGBA1C Lab Results  Component Value Date   CHOL 115 03/28/2015   HDL 47 03/28/2015   LDLCALC 41 03/28/2015   LDLDIRECT 60.7 11/13/2010   TRIG 133 03/28/2015   CHOLHDL 2.9 12/02/2011    Assessment/Plan 1. 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 Continue on metoprolol and HZCT .Monitor BMP  2. Coronary artery disease involving native coronary artery of native heart without angina pectoris Chest pain free. Continue on ASA   3. Chronic  diastolic CHF (congestive heart failure) (HCC) Stable. No recent weight gain. Exam finding negative for wheezes, shortness of breath or edema. Continue on Metoprolol.Monitor weight.   4. Gastroesophageal reflux disease without esophagitis Symptoms controlled. Continue on Nexium.   5. Hypothyroidism, unspecified hypothyroidism type Continue on Levothyroxine. Monitor TSH level.   6. CKD (chronic kidney disease) stage 3, GFR 30-59 ml/min Continue to control high risk factors and avoid nephrotoxins. Monitor BMP.     Family/ staff Communication: Reviewed plan of care with patient and facilty Nurse supervisor.   Labs/tests ordered:  None

## 2015-11-19 DIAGNOSIS — Z961 Presence of intraocular lens: Secondary | ICD-10-CM | POA: Diagnosis not present

## 2015-11-19 DIAGNOSIS — Z7952 Long term (current) use of systemic steroids: Secondary | ICD-10-CM | POA: Diagnosis not present

## 2015-11-19 DIAGNOSIS — H04123 Dry eye syndrome of bilateral lacrimal glands: Secondary | ICD-10-CM | POA: Diagnosis not present

## 2015-11-23 ENCOUNTER — Encounter: Payer: Self-pay | Admitting: Family

## 2015-11-23 ENCOUNTER — Non-Acute Institutional Stay (SKILLED_NURSING_FACILITY): Payer: Medicare Other | Admitting: Family

## 2015-11-23 DIAGNOSIS — M25512 Pain in left shoulder: Secondary | ICD-10-CM

## 2015-11-23 MED ORDER — DICLOFENAC SODIUM 1 % TD GEL: 2.0000 g | Freq: Four times a day (QID) | TRANSDERMAL | Status: DC

## 2015-11-23 NOTE — Progress Notes (Signed)
Patient ID: [name], female   DOB: [birth year], 82 y.o.   MRN: [id number]  Location:  [place] Room Number: [id number] A Place of Service:  SNF (31) Provider: Liviya Santini FNP-C   [name], MD  Patient Care Team: [name], MD as PCP - General (Internal Medicine) [name], NP as Nurse Practitioner (Nurse Practitioner)  Extended Emergency Contact Information Primary Emergency Contact: Arizmendi,[name] Address: [id number] [place], [place] Phone: [phone] Mobile Phone: [phone] Relation: Son Secondary Emergency Contact: [name] [place] of [place] Phone: [phone] Mobile Phone: [phone] Relation: Daughter  Code Status: Full Code  Goals of care: Advanced Directive information Advanced Directives 11/23/2015  Does patient have an advance directive? No  Type of Advance Directive -  Copy of advanced directive(s) in chart? -     Chief Complaint  Patient presents with  . Acute Visit    Left shoulder pain    HPI:  Pt is a 82 y.o. female seen today at [facility] for an acute visit for evaluation of left shoulder pain. She has a significant medical history of RA, Osteoporosis, Arthritis, anxiety, Depression among others. She is seen in her room today. She complains of worsening left shoulder pain radiating to the neck area. She is currently on Norco twice daily for generalized pain. She denies any numbness or tingling on hand, no chest pain or shortness of breath. Also denies any injury to shoulder.    Past Medical History  Diagnosis Date  . Arthritis   . Right ear pain   . Renal insufficiency   . RA (rheumatoid arthritis) ([place])   . PUD (peptic ulcer disease)   . GI bleed 2005  . GERD (gastroesophageal reflux disease)   . Hiatal hernia   . Hypothyroidism   . Osteoporosis   . Depression   . Anxiety   . Coronary artery disease     a. 2012 s/p  NSTEMI/Cath: 95% apical LAD dzs, otw nonobs dzs-->Med Rx;  b. 05/2011 MV: no ischemia, EF 79%, inf infarct- attenuation.  . CVA (cerebral vascular accident) ([place])   . TIA (transient ischemic attack)   . Fall   . DVT of lower extremity (deep venous thrombosis) ([place])   . HCAP (healthcare-associated pneumonia) 01/22/2013  . Enteritis due to Clostridium difficile 01/04/2013  . Sepsis ([place]) 12/19/2012  . Recurrent colitis due to Clostridium difficile 06/02/2012  . Essential hypertension   . Hyperlipidemia   . Fatigue   . Weight loss   . Hemorrhoids   . Malnutrition ([place])   . Dyspnea   . Elevated troponin     a. 10/2014 in setting of sepsis/?HCAP.  Marland Kitchen Chronic diastolic CHF (congestive heart failure) ([place])     a. 10/2014 Echo: EF 60-65%, mild LVH, grade 1 DD, dynamic obstruction @ rest - peak velocity of 271 cm/sec, peak grad of 31mmHg, PASP 28mmHg.  Marland Kitchen Obstructive hypertrophic cardiomyopathy ([place])     a. 10/2014 Echo: EF 60-65%, mild LVH, grade 1 DD, dynamic obstruction @ rest - peak velocity of 271 cm/sec, peak grad of 66mmHg, PASP 34mmHg.   Past Surgical History  Procedure Laterality Date  . Nstemi  06/2010  . Spinal fusion surgery    . Knee surgery    . Cardiac catheterization      SHOWED RUPTURE PLAQUE IN THE LAD. THE LAD IS NONOBSTRUCTIVE WITH ONLY 30-40%  STENOSIS  . Back surgery    . Breast surgery  1964    x3  . Ankle surgery    . Colonoscopy      Allergies  Allergen Reactions  . Biphosphate   . Codeine Swelling  . Morphine And Related Other (See Comments)    unknown  . Percocet [Oxycodone-Acetaminophen]     unknown  . Plavix [Clopidogrel Bisulfate] Other (See Comments)    RASH  . Sulfur Other (See Comments)    GI upset      Medication List       This list is accurate as of: 11/23/15  1:23 PM.  Always use your most recent med list.               ACT DRY MOUTH Lozg  Use as directed in the mouth or throat as needed (for dry mouth).     aspirin 81 MG chewable tablet    Chew 81 mg by mouth daily.     calcium-vitamin D 500-200 MG-UNIT tablet  Commonly known as:  OSCAL WITH D  Take 1 tablet by mouth 3 (three) times daily.     CERTAVITE/ANTIOXIDANTS PO  Take 1 tablet by mouth daily.     cetirizine 10 MG tablet  Commonly known as:  ZYRTEC  Take 10 mg by mouth daily.     Cranberry 200 MG Caps  Take 2 capsules by mouth 2 (two) times daily.     ELIQUIS 5 MG Tabs tablet  Generic drug:  apixaban  Take 5 mg by mouth 2 (two) times daily.     esomeprazole 40 MG capsule  Commonly known as:  NEXIUM  Take 40 mg by mouth 2 (two) times daily before a meal. Take one capsule by mouth twice daily for stomach     ferrous sulfate 325 (65 FE) MG tablet  Take 325 mg by mouth 3 (three) times daily with meals.     Fish Oil 1200 MG Caps  Take 1,200 mg by mouth daily.     HYDROcodone-acetaminophen 5-325 MG tablet  Commonly known as:  NORCO/VICODIN  Take one tablet by mouth twice daily for pain. Do not exceed 4gm of Tylenol in 24 hours     ipratropium 0.03 % nasal spray  Commonly known as:  ATROVENT  Place 2 sprays into both nostrils daily as needed (For asthma).     ipratropium-albuterol 0.5-2.5 (3) MG/3ML Soln  Commonly known as:  DUONEB  Take 3 mLs by nebulization every 8 (eight) hours as needed. For shortness of breath     levothyroxine 50 MCG tablet  Commonly known as:  SYNTHROID, LEVOTHROID  Take 50 mcg by mouth daily.     lidocaine 5 %  Commonly known as:  LIDODERM  Place 1 patch onto the skin daily. Remove & Discard patch within 12 hours or as directed by MD     LORazepam 0.5 MG tablet  Commonly known as:  ATIVAN  Take one tablet by mouth every night at bedtime for anxiety     methocarbamol 500 MG tablet  Commonly known as:  ROBAXIN  Take 500 mg by mouth at bedtime.     metoprolol 50 MG tablet  Commonly known as:  LOPRESSOR  Take 50 mg by mouth 2 (two) times daily.     montelukast 10 MG tablet  Commonly known as:  SINGULAIR  Take 10 mg  by mouth at bedtime.     ondansetron 4 MG tablet  Commonly known as:  ZOFRAN  Take 4 mg by mouth every 6 (six) hours as needed for nausea or vomiting.     OXYGEN  Inhale 2 L into the lungs.     PATADAY 0.2 % Soln  Generic drug:  Olopatadine HCl  Place 1 drop into both eyes daily.     predniSONE 10 MG tablet  Commonly known as:  DELTASONE  Take 10 mg by mouth daily.     promethazine 25 MG tablet  Commonly known as:  PHENERGAN  Take 25 mg by mouth 2 (two) times daily as needed for nausea.     rosuvastatin 20 MG tablet  Commonly known as:  CRESTOR  Take 20 mg by mouth every evening.     sertraline 50 MG tablet  Commonly known as:  ZOLOFT  Take 50 mg by mouth every morning.     Vitamin D 2000 units Caps  Take 1 capsule (2,000 Units total) by mouth daily.        Review of Systems  Constitutional: Negative for fever, chills, diaphoresis, activity change, appetite change and fatigue.  HENT: Negative for congestion and sore throat.   Eyes: Negative.   Respiratory: Negative.   Cardiovascular: Negative for chest pain, palpitations and leg swelling.  Gastrointestinal: Negative for nausea, vomiting, abdominal pain, diarrhea, constipation, blood in stool and abdominal distention.  Endocrine: Negative.   Genitourinary: Negative for dysuria, urgency, frequency, hematuria, flank pain, decreased urine volume, enuresis, difficulty urinating and dyspareunia.  Musculoskeletal: Negative for myalgias and back pain.  Skin: Negative.   Allergic/Immunologic: Negative.   Neurological: Negative.   Hematological: Negative.   Psychiatric/Behavioral: Negative.     Immunization History  Administered Date(s) Administered  . Influenza-Unspecified 05/09/2013, 04/27/2014, 04/17/2015, 09/17/2015  . PPD Test 12/22/2012, 03/14/2014, 03/08/2015  . Pneumococcal-Unspecified 05/09/2013, 09/09/2013   Pertinent  Health Maintenance Due  Topic Date Due  . INFLUENZA VACCINE  01/29/2016  . DEXA SCAN   Completed  . PNA vac Low Risk Adult  Addressed   Fall Risk  10/04/2014  Falls in the past year? Yes  Number falls in past yr: 2 or more  Injury with Fall? No  Risk Factor Category  High Fall Risk  Risk for fall due to : Impaired balance/gait;Impaired mobility;Medication side effect;History of fall(s)  Follow up Education provided;Falls evaluation completed;Falls prevention discussed   Functional Status Survey:    Filed Vitals:   11/23/15 1025  BP: 126/65  Pulse: 77  Temp: 98 F (36.7 C)  TempSrc: Oral  Resp: 20  Height: 5\' 2"  (1.575 m)  Weight: 140 lb (63.504 kg)  SpO2: 99%   Body mass index is 25.6 kg/(m^2). Physical Exam  Constitutional: She is oriented to person, place, and time. She appears well-developed and well-nourished. No distress.  HENT:  Head: Normocephalic and atraumatic.  Mouth/Throat: Oropharynx is clear and moist.  Eyes: Conjunctivae and EOM are normal. Pupils are equal, round, and reactive to light. Right eye exhibits no discharge. Left eye exhibits no discharge. No scleral icterus.  Neck: Normal range of motion. No JVD present. No thyromegaly present.  Cardiovascular: Normal rate, regular rhythm, normal heart sounds and intact distal pulses.  Exam reveals no gallop and no friction rub.   No murmur heard. Pulmonary/Chest: Effort normal and breath sounds normal. No respiratory distress. She has no wheezes. She has no rales.  Abdominal: Soft. Bowel sounds are normal. She exhibits no distension. There is no tenderness. There is no rebound and no guarding.  Musculoskeletal: She exhibits no edema or  tenderness.  Slight limited ROM to UE/Knees. Abnormal gait   Lymphadenopathy:    She has no cervical adenopathy.  Neurological: She is oriented to person, place, and time.  Skin: Skin is warm and dry. No rash noted. No erythema.  Psychiatric: She has a normal mood and affect.    Labs reviewed:  Recent Labs  07/24/15 1122 07/25/15 0706 07/26/15 0655 08/29/15    NA 143 141 139 144  K 3.0* 3.4* 3.3* 4.5  CL 107 109 108  --   CO2 21* 21* 19*  --   GLUCOSE 132* 83 87  --   BUN 20 18 12 21   CREATININE 1.54* 1.52* 1.38* 1.3*  CALCIUM 8.5* 8.4* 8.1*  --   MG  --  1.4*  --   --     Recent Labs  07/22/15 1123 07/23/15 0226 08/29/15  AST 23 29 19   ALT 15 13* 16  ALKPHOS 66 46 44  BILITOT 1.1 0.8  --   PROT 6.3* 4.9*  --   ALBUMIN 2.9* 2.1*  --     Recent Labs  06/28/15 1555 07/22/15 1123  07/24/15 1122 07/25/15 0706 07/26/15 0655 08/29/15  WBC 13.9* 27.8*  < > 18.1* 17.5* 13.4* 9.0  NEUTROABS 12.5* 24.0*  --   --   --   --   --   HGB 12.4 13.0  < > 10.0* 10.5* 10.4* 9.1*  HCT 38.2 39.7  < > 31.0* 32.6* 32.0* 30*  MCV 87.5 88.6  < > 88.8 89.6 87.9  --   PLT 173.0 116*  < > 204 128* 131* 203  < > = values in this interval not displayed. Lab Results  Component Value Date   TSH 0.61 09/20/2015   No results found for: HGBA1C Lab Results  Component Value Date   CHOL 115 03/28/2015   HDL 47 03/28/2015   LDLCALC 41 03/28/2015   LDLDIRECT 60.7 11/13/2010   TRIG 133 03/28/2015   CHOLHDL 2.9 12/02/2011    Significant Diagnostic Results in last 30 days:  No results found.  Assessment/Plan Shoulder pain, left Worsening shoulder pain.Sl. Limited ROM due to pain.Continue on Norco 5/325 mg Tablet. Start Voltaren 1% gel apply 2 Gram to left shoulder pain every 6 hours     Family/ staff Communication:Reviewed plan of care with patient and facility Nurse supervisor  Labs/tests ordered:  Portable CXR PA/Lat and Oblique r/o FX.

## 2015-12-04 DIAGNOSIS — D509 Iron deficiency anemia, unspecified: Secondary | ICD-10-CM | POA: Diagnosis not present

## 2015-12-04 LAB — BASIC METABOLIC PANEL
BUN: 29 mg/dL — AB (ref 4–21)
Creatinine: 1.4 mg/dL — AB (ref 0.5–1.1)
Glucose: 128 mg/dL
Potassium: 3.9 mmol/L (ref 3.4–5.3)
Sodium: 145 mmol/L (ref 137–147)

## 2015-12-04 LAB — CBC AND DIFFERENTIAL
HCT: 34 % — AB (ref 36–46)
Hemoglobin: 9.8 g/dL — AB (ref 12.0–16.0)
Neutrophils Absolute: 13 /uL
PLATELETS: 257 10*3/uL (ref 150–399)
WBC: 17.5 10*3/mL

## 2015-12-05 ENCOUNTER — Non-Acute Institutional Stay (SKILLED_NURSING_FACILITY): Payer: Medicare Other | Admitting: Family

## 2015-12-05 DIAGNOSIS — D509 Iron deficiency anemia, unspecified: Secondary | ICD-10-CM | POA: Diagnosis not present

## 2015-12-05 DIAGNOSIS — D72829 Elevated white blood cell count, unspecified: Secondary | ICD-10-CM | POA: Diagnosis not present

## 2015-12-07 DIAGNOSIS — N390 Urinary tract infection, site not specified: Secondary | ICD-10-CM | POA: Diagnosis not present

## 2015-12-07 DIAGNOSIS — D508 Other iron deficiency anemias: Secondary | ICD-10-CM | POA: Diagnosis not present

## 2015-12-13 ENCOUNTER — Non-Acute Institutional Stay (SKILLED_NURSING_FACILITY): Payer: Medicare Other | Admitting: Family

## 2015-12-13 ENCOUNTER — Encounter: Payer: Self-pay | Admitting: Family

## 2015-12-13 DIAGNOSIS — I5030 Unspecified diastolic (congestive) heart failure: Secondary | ICD-10-CM

## 2015-12-13 DIAGNOSIS — I130 Hypertensive heart and chronic kidney disease with heart failure and stage 1 through stage 4 chronic kidney disease, or unspecified chronic kidney disease: Secondary | ICD-10-CM

## 2015-12-13 DIAGNOSIS — K219 Gastro-esophageal reflux disease without esophagitis: Secondary | ICD-10-CM | POA: Diagnosis not present

## 2015-12-13 DIAGNOSIS — E039 Hypothyroidism, unspecified: Secondary | ICD-10-CM | POA: Diagnosis not present

## 2015-12-13 DIAGNOSIS — M25512 Pain in left shoulder: Secondary | ICD-10-CM | POA: Diagnosis not present

## 2015-12-13 DIAGNOSIS — F329 Major depressive disorder, single episode, unspecified: Secondary | ICD-10-CM | POA: Diagnosis not present

## 2015-12-13 DIAGNOSIS — R1310 Dysphagia, unspecified: Secondary | ICD-10-CM

## 2015-12-13 DIAGNOSIS — I5032 Chronic diastolic (congestive) heart failure: Secondary | ICD-10-CM

## 2015-12-13 DIAGNOSIS — F32A Depression, unspecified: Secondary | ICD-10-CM

## 2015-12-13 DIAGNOSIS — N183 Chronic kidney disease, stage 3 unspecified: Secondary | ICD-10-CM

## 2015-12-13 DIAGNOSIS — F419 Anxiety disorder, unspecified: Secondary | ICD-10-CM

## 2015-12-13 DIAGNOSIS — N390 Urinary tract infection, site not specified: Secondary | ICD-10-CM | POA: Diagnosis not present

## 2015-12-13 NOTE — Progress Notes (Signed)
Patient ID: [name], female   DOB: 1929-12-24, 82 y.o.   MRN: [id number]  Location:  [place] Room Number: 904-A Place of Service:  SNF (31) Provider:  Abeeha Twist FNP-C   [name], MD  Patient Care Team: [name], MD as PCP - General (Internal Medicine) [name], NP as Nurse Practitioner (Nurse Practitioner)  Extended Emergency Contact Information Primary Emergency Contact: Mansour,[name] Address: [id number], [place] Phone: [phone] Mobile Phone: [phone] Relation: Son Secondary Emergency Contact: [name] [place] Phone: [phone] Mobile Phone: [phone] Relation: Daughter  Code Status: Full Code  Goals of care: Advanced Directive information Advanced Directives 12/13/2015  Does patient have an advance directive? Yes  Type of Advance Directive (No Data)  Does patient want to make changes to advanced directive? No - Patient declined  Copy of advanced directive(s) in chart? Yes     Chief Complaint  Patient presents with  . Medical Management of Chronic Issues    Routine Visit     HPI:  Pt is a 82 y.o. female seen today at [facility] for medical management of chronic diseases.She is seen today in her room. She complains of left shoulder pain states took a shot in May, 2016 request referral to Ortho for evaluation. She continues to require assistance with ADL's.Self propel on wheelchair. No recent fall episodes reported.      Past Medical History  Diagnosis Date  . Arthritis   . Right ear pain   . Renal insufficiency   . RA (rheumatoid arthritis) ([place])   . PUD (peptic ulcer disease)   . GI bleed 2005  . GERD (gastroesophageal reflux disease)   . Hiatal hernia   . Hypothyroidism   . Osteoporosis   . Depression   . Anxiety   . Coronary artery disease     a. 2012 s/p NSTEMI/Cath: 95% apical LAD dzs, otw  nonobs dzs-->Med Rx;  b. 05/2011 MV: no ischemia, EF 79%, inf infarct- attenuation.  . CVA (cerebral vascular accident) ([place])   . TIA (transient ischemic attack)   . Fall   . DVT of lower extremity (deep venous thrombosis) ([place])   . HCAP (healthcare-associated pneumonia) 01/22/2013  . Enteritis due to Clostridium difficile 01/04/2013  . Sepsis ([place]) 12/19/2012  . Recurrent colitis due to Clostridium difficile 06/02/2012  . Essential hypertension   . Hyperlipidemia   . Fatigue   . Weight loss   . Hemorrhoids   . Malnutrition ([place])   . Dyspnea   . Elevated troponin     a. 10/2014 in setting of sepsis/?HCAP.  Marland Kitchen Chronic diastolic CHF (congestive heart failure) ([place])     a. 10/2014 Echo: EF 60-65%, mild LVH, grade 1 DD, dynamic obstruction @ rest - peak velocity of 271 cm/sec, peak grad of 69mmHg, PASP 63mmHg.  Marland Kitchen Obstructive hypertrophic cardiomyopathy ([place])     a. 10/2014 Echo: EF 60-65%, mild LVH, grade 1 DD, dynamic obstruction @ rest - peak velocity of 271 cm/sec, peak grad of 73mmHg, PASP 26mmHg.   Past Surgical History  Procedure Laterality Date  . Nstemi  06/2010  . Spinal fusion surgery    . Knee surgery    . Cardiac catheterization      SHOWED RUPTURE PLAQUE IN THE LAD. THE LAD IS NONOBSTRUCTIVE WITH ONLY 30-40% STENOSIS  . Back surgery    .  Breast surgery  1964    x3  . Ankle surgery    . Colonoscopy      Allergies  Allergen Reactions  . Biphosphate   . Codeine Swelling  . Morphine And Related Other (See Comments)    unknown  . Percocet [Oxycodone-Acetaminophen]     unknown  . Plavix [Clopidogrel Bisulfate] Other (See Comments)    RASH  . Sulfur Other (See Comments)    GI upset      Medication List       This list is accurate as of: 12/13/15  3:09 PM.  Always use your most recent med list.               ACT DRY MOUTH Lozg  Use as directed in the mouth or throat as needed (for dry mouth).     amoxicillin 500 MG tablet  Commonly known as:  AMOXIL  Take 4  tablets 1 hour prior to dental appointment     aspirin 81 MG chewable tablet  Chew 81 mg by mouth daily.     calcium-vitamin D 500-200 MG-UNIT tablet  Commonly known as:  OSCAL WITH D  Take 1 tablet by mouth 3 (three) times daily.     CERTAVITE/ANTIOXIDANTS PO  Take 1 tablet by mouth daily.     cetirizine 10 MG tablet  Commonly known as:  ZYRTEC  Take 10 mg by mouth daily.     Cranberry 200 MG Caps  Take 2 capsules by mouth 2 (two) times daily.     diclofenac sodium 1 % Gel  Commonly known as:  VOLTAREN  Apply 2 g topically 4 (four) times daily. To left shoulder     ELIQUIS 5 MG Tabs tablet  Generic drug:  apixaban  Take 5 mg by mouth 2 (two) times daily.     esomeprazole 40 MG capsule  Commonly known as:  NEXIUM  Take 40 mg by mouth 2 (two) times daily before a meal. Take one capsule by mouth twice daily for stomach     ferrous sulfate 325 (65 FE) MG tablet  Take 325 mg by mouth 3 (three) times daily with meals.     Fish Oil 1200 MG Caps  Take 1,200 mg by mouth daily.     HYDROcodone-acetaminophen 5-325 MG tablet  Commonly known as:  NORCO/VICODIN  Take one tablet by mouth twice daily for pain. Do not exceed 4gm of Tylenol in 24 hours     ipratropium 0.03 % nasal spray  Commonly known as:  ATROVENT  Place 2 sprays into both nostrils daily as needed (For asthma).     ipratropium-albuterol 0.5-2.5 (3) MG/3ML Soln  Commonly known as:  DUONEB  Take 3 mLs by nebulization every 8 (eight) hours as needed. For shortness of breath     levothyroxine 50 MCG tablet  Commonly known as:  SYNTHROID, LEVOTHROID  Take 50 mcg by mouth daily.     lidocaine 5 %  Commonly known as:  LIDODERM  Place 1 patch onto the skin daily. Remove & Discard patch within 12 hours or as directed by MD     LORazepam 0.5 MG tablet  Commonly known as:  ATIVAN  Take one tablet by mouth every night at bedtime for anxiety     methocarbamol 500 MG tablet  Commonly known as:  ROBAXIN  Take 500 mg  by mouth at bedtime.     metoprolol 50 MG tablet  Commonly known as:  LOPRESSOR  Take 50 mg  by mouth 2 (two) times daily.     montelukast 10 MG tablet  Commonly known as:  SINGULAIR  Take 10 mg by mouth at bedtime.     nitroGLYCERIN 0.4 MG SL tablet  Commonly known as:  NITROSTAT  Place 0.4 mg under the tongue every 5 (five) minutes as needed for chest pain.     ondansetron 4 MG tablet  Commonly known as:  ZOFRAN  Take 4 mg by mouth every 6 (six) hours as needed for nausea or vomiting.     OXYGEN  Inhale 2 L into the lungs.     PATADAY 0.2 % Soln  Generic drug:  Olopatadine HCl  Place 1 drop into both eyes daily.     predniSONE 10 MG tablet  Commonly known as:  DELTASONE  Take 10 mg by mouth daily.     promethazine 25 MG tablet  Commonly known as:  PHENERGAN  Take 25 mg by mouth 2 (two) times daily as needed for nausea.     rosuvastatin 20 MG tablet  Commonly known as:  CRESTOR  Take 20 mg by mouth every evening.     saccharomyces boulardii 250 MG capsule  Commonly known as:  FLORASTOR  Take 250 mg by mouth 2 (two) times daily.     sertraline 50 MG tablet  Commonly known as:  ZOLOFT  Take 50 mg by mouth every morning.     Vitamin D 2000 units Caps  Take 1 capsule (2,000 Units total) by mouth daily.        Review of Systems  Constitutional: Negative for fever, chills, diaphoresis, activity change, appetite change and fatigue.  HENT: Negative for congestion and sore throat.   Eyes: Negative.   Respiratory: Negative.   Cardiovascular: Negative for chest pain, palpitations and leg swelling.  Gastrointestinal: Negative for nausea, vomiting, abdominal pain, diarrhea, constipation, blood in stool and abdominal distention.  Endocrine: Negative.   Genitourinary: Negative for dysuria, urgency, frequency, hematuria, flank pain, decreased urine volume, enuresis, difficulty urinating and dyspareunia.  Musculoskeletal: Positive for gait problem. Negative for myalgias and  back pain.       Left shoulder pain   Skin: Negative.   Allergic/Immunologic: Negative.   Neurological: Negative.   Hematological: Negative.   Psychiatric/Behavioral: Negative for hallucinations, confusion, sleep disturbance and agitation.    Immunization History  Administered Date(s) Administered  . Influenza-Unspecified 05/09/2013, 04/27/2014, 04/17/2015, 09/17/2015  . PPD Test 12/22/2012, 03/14/2014, 03/08/2015  . Pneumococcal-Unspecified 05/09/2013, 09/09/2013   Pertinent  Health Maintenance Due  Topic Date Due  . INFLUENZA VACCINE  01/29/2016  . DEXA SCAN  Completed  . PNA vac Low Risk Adult  Addressed   Fall Risk  10/04/2014  Falls in the past year? Yes  Number falls in past yr: 2 or more  Injury with Fall? No  Risk Factor Category  High Fall Risk  Risk for fall due to : Impaired balance/gait;Impaired mobility;Medication side effect;History of fall(s)  Follow up Education provided;Falls evaluation completed;Falls prevention discussed   Functional Status Survey:    Filed Vitals:   12/13/15 1020  BP: 149/65  Pulse: 88  Temp: 99.5 F (37.5 C)  Resp: 18  Height: 5\' 2"  (1.575 m)  Weight: 143 lb (64.864 kg)  SpO2: 98%   Body mass index is 26.15 kg/(m^2). Physical Exam  Constitutional: She is oriented to person, place, and time. She appears well-developed and well-nourished. No distress.  HENT:  Head: Normocephalic and atraumatic.  Mouth/Throat: Oropharynx is clear and moist.  Eyes: Conjunctivae and EOM are normal. Pupils are equal, round, and reactive to light. Right eye exhibits no discharge. Left eye exhibits no discharge. No scleral icterus.  Neck: Normal range of motion. No JVD present. No thyromegaly present.  Cardiovascular: Normal rate, regular rhythm, normal heart sounds and intact distal pulses.  Exam reveals no gallop and no friction rub.   No murmur heard. Pulmonary/Chest: Effort normal and breath sounds normal. No respiratory distress. She has no  wheezes. She has no rales.  Abdominal: Soft. Bowel sounds are normal. She exhibits no distension. There is no tenderness. There is no rebound and no guarding.  Musculoskeletal: She exhibits no edema or tenderness.  Slight limited ROM to UE/Knees. Abnormal gait   Lymphadenopathy:    She has no cervical adenopathy.  Neurological: She is oriented to person, place, and time.  Skin: Skin is warm and dry. No rash noted. No erythema.  Psychiatric: She has a normal mood and affect.    Labs reviewed:  Recent Labs  07/24/15 1122 07/25/15 0706 07/26/15 0655 08/29/15 12/04/15  NA 143 141 139 144 145  K 3.0* 3.4* 3.3* 4.5 3.9  CL 107 109 108  --   --   CO2 21* 21* 19*  --   --   GLUCOSE 132* 83 87  --   --   BUN 20 18 12 21  29*  CREATININE 1.54* 1.52* 1.38* 1.3* 1.4*  CALCIUM 8.5* 8.4* 8.1*  --   --   MG  --  1.4*  --   --   --     Recent Labs  07/22/15 1123 07/23/15 0226 08/29/15  AST 23 29 19   ALT 15 13* 16  ALKPHOS 66 46 44  BILITOT 1.1 0.8  --   PROT 6.3* 4.9*  --   ALBUMIN 2.9* 2.1*  --     Recent Labs  06/28/15 1555 07/22/15 1123  07/24/15 1122 07/25/15 0706 07/26/15 0655 08/29/15 12/04/15  WBC 13.9* 27.8*  < > 18.1* 17.5* 13.4* 9.0 17.5  NEUTROABS 12.5* 24.0*  --   --   --   --   --  13  HGB 12.4 13.0  < > 10.0* 10.5* 10.4* 9.1* 9.8*  HCT 38.2 39.7  < > 31.0* 32.6* 32.0* 30* 34*  MCV 87.5 88.6  < > 88.8 89.6 87.9  --   --   PLT 173.0 116*  < > 204 128* 131* 203 257  < > = values in this interval not displayed. Lab Results  Component Value Date   TSH 0.61 09/20/2015   No results found for: HGBA1C Lab Results  Component Value Date   CHOL 115 03/28/2015   HDL 47 03/28/2015   LDLCALC 41 03/28/2015   LDLDIRECT 60.7 11/13/2010   TRIG 133 03/28/2015   CHOLHDL 2.9 12/02/2011    Significant Diagnostic Results in last 30 days:  No results found.  Assessment/Plan 1. 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 Continue Metoprolol 50 mg tablet. BMP in 4 weeks.   2. Chronic diastolic CHF (congestive heart failure) (HCC) Stable. No recent weight gain. Exam findings negative for rales, wheezing or crackles. Monitor weight.   3. Dysphagia Continue with Aspiration precaution.   4. Gastroesophageal reflux disease without esophagitis Stable. Continue on Nexium 40 mg Capsule.   5. Hypothyroidism, unspecified hypothyroidism type Continue on levothyroxine 50 mcg tablet daily. TSH level in 4 weeks.   6. Depression  No mood changes. Continue on Sertraline 50 mg Tablet.   7. Anxiety Stable on Lorazepam 0.5 mg Tablet.   8. Left shoulder pain  Worsening pain request Ortho referral. Continue on Hydrocodone/APAP for pain. Refer to [facility] specialist for evaluation patient would like a shot to shoulder joint states had shot in may 30 th 2016 which relieved pain.      Family/ staff Communication: Reviewed plan of care with patient and facility Nurse supervisor.   Labs/tests ordered: CBC, BMP, TSH, Hgb A1C and Lipid panel in 4 weeks.

## 2015-12-19 ENCOUNTER — Other Ambulatory Visit: Payer: Self-pay | Admitting: *Deleted

## 2015-12-19 ENCOUNTER — Non-Acute Institutional Stay (SKILLED_NURSING_FACILITY): Payer: Medicare Other | Admitting: Internal Medicine

## 2015-12-19 ENCOUNTER — Encounter: Payer: Self-pay | Admitting: Internal Medicine

## 2015-12-19 DIAGNOSIS — I70229 Atherosclerosis of native arteries of extremities with rest pain, unspecified extremity: Secondary | ICD-10-CM

## 2015-12-19 DIAGNOSIS — M810 Age-related osteoporosis without current pathological fracture: Secondary | ICD-10-CM | POA: Diagnosis not present

## 2015-12-19 DIAGNOSIS — I739 Peripheral vascular disease, unspecified: Secondary | ICD-10-CM | POA: Diagnosis not present

## 2015-12-19 DIAGNOSIS — L84 Corns and callosities: Secondary | ICD-10-CM

## 2015-12-19 DIAGNOSIS — H8113 Benign paroxysmal vertigo, bilateral: Secondary | ICD-10-CM

## 2015-12-19 DIAGNOSIS — H8110 Benign paroxysmal vertigo, unspecified ear: Secondary | ICD-10-CM | POA: Insufficient documentation

## 2015-12-19 MED ORDER — HYDROCODONE-ACETAMINOPHEN 5-325 MG PO TABS: ORAL_TABLET | ORAL | Status: DC

## 2015-12-19 NOTE — Progress Notes (Signed)
Patient ID: [name], female   DOB: [birth year], 82 y.o.   MRN: [id number]     [facility]  PCP: [name], MD  Code Status: Full Code   Allergies  Allergen Reactions  . Biphosphate   . Codeine Swelling  . Morphine And Related Other (See Comments)    unknown  . Percocet [Oxycodone-Acetaminophen]     unknown  . Plavix [Clopidogrel Bisulfate] Other (See Comments)    RASH  . Sulfur Other (See Comments)    GI upset    Chief Complaint  Patient presents with  . Acute Visit    Right great toe pain    Advanced Directives 12/13/2015  Does patient have an advance directive? Yes  Type of Advance Directive (No Data)  Does patient want to make changes to advanced directive? No - Patient declined  Copy of advanced directive(s) in chart? Yes    HPI:  82 y.o. patient is seen for acute visit. She complaints of pain to her right great toe and as per treatment nurse there is a small thickened area to the tip of right great toe concerning for callus and she has redness to the tip of her toe. She complaints of intermittent pain with rest and more at night. She also complaints of dizziness with movement and rest. She has history of vertigo and mentions that meclizine has worked for her in the past. She has been on prolia injection before and would like to know if she can get another dose.   Review of Systems:  Constitutional: Negative for fever  HENT: Negative for headache, congestion, ringing in her ears or earache Respiratory: Negative for cough, wheezing. Has chronic dyspnea with exertion. Breathing currently stable. Using oxygen only on need basis during daytime. Cardiovascular: Negative for chest pain, palpitations, leg swelling.  Gastrointestinal: Negative for heartburn, nausea, vomiting, abdominal pain.  Genitourinary: Negative for dysuria, flank pain.  Musculoskeletal: Negative for back pain, fall. Skin: Negative for itching, rash.  Psychiatric/Behavioral:  Negative for depression   Past Medical History  Diagnosis Date  . Arthritis   . Right ear pain   . Renal insufficiency   . RA (rheumatoid arthritis) ([place])   . PUD (peptic ulcer disease)   . GI bleed 2005  . GERD (gastroesophageal reflux disease)   . Hiatal hernia   . Hypothyroidism   . Osteoporosis   . Depression   . Anxiety   . Coronary artery disease     a. 2012 s/p NSTEMI/Cath: 95% apical LAD dzs, otw nonobs dzs-->Med Rx;  b. 05/2011 MV: no ischemia, EF 79%, inf infarct- attenuation.  . CVA (cerebral vascular accident) ([place])   . TIA (transient ischemic attack)   . Fall   . DVT of lower extremity (deep venous thrombosis) ([place])   . HCAP (healthcare-associated pneumonia) 01/22/2013  . Enteritis due to Clostridium difficile 01/04/2013  . Sepsis ([place]) 12/19/2012  . Recurrent colitis due to Clostridium difficile 06/02/2012  . Essential hypertension   . Hyperlipidemia   . Fatigue   . Weight loss   . Hemorrhoids   . Malnutrition ([place])   . Dyspnea   . Elevated troponin     a. 10/2014 in setting of sepsis/?HCAP.  Marland Kitchen Chronic diastolic CHF (congestive heart failure) ([place])     a. 10/2014 Echo: EF 60-65%, mild LVH, grade 1 DD, dynamic obstruction @ rest - peak velocity of 271 cm/sec, peak grad of 20mmHg, PASP 63mmHg.  Marland Kitchen Obstructive hypertrophic cardiomyopathy ([place])  a. 10/2014 Echo: EF 60-65%, mild LVH, grade 1 DD, dynamic obstruction @ rest - peak velocity of 271 cm/sec, peak grad of 36mmHg, PASP 29mmHg.   Past Surgical History  Procedure Laterality Date  . Nstemi  06/2010  . Spinal fusion surgery    . Knee surgery    . Cardiac catheterization      SHOWED RUPTURE PLAQUE IN THE LAD. THE LAD IS NONOBSTRUCTIVE WITH ONLY 30-40% STENOSIS  . Back surgery    . Breast surgery  1964    x3  . Ankle surgery    . Colonoscopy       Medications:   Medication List       This list is accurate as of: 12/19/15 11:35 AM.  Always use your most recent med list.               ACT DRY MOUTH  Lozg  Use as directed in the mouth or throat as needed (for dry mouth).     amoxicillin 500 MG tablet  Commonly known as:  AMOXIL  Take 4 tablets 1 hour prior to dental appointment     aspirin 81 MG chewable tablet  Chew 81 mg by mouth daily.     calcium-vitamin D 500-200 MG-UNIT tablet  Commonly known as:  OSCAL WITH D  Take 1 tablet by mouth 3 (three) times daily.     CERTAVITE/ANTIOXIDANTS PO  Take 1 tablet by mouth daily.     cetirizine 10 MG tablet  Commonly known as:  ZYRTEC  Take 10 mg by mouth daily.     Cranberry 200 MG Caps  Take 2 capsules by mouth 2 (two) times daily.     diclofenac sodium 1 % Gel  Commonly known as:  VOLTAREN  Apply 2 g topically 4 (four) times daily. To left shoulder     ELIQUIS 5 MG Tabs tablet  Generic drug:  apixaban  Take 5 mg by mouth 2 (two) times daily.     esomeprazole 40 MG capsule  Commonly known as:  NEXIUM  Take 40 mg by mouth 2 (two) times daily before a meal. Take one capsule by mouth twice daily for stomach     ferrous sulfate 325 (65 FE) MG tablet  Take 325 mg by mouth 3 (three) times daily with meals.     Fish Oil 1200 MG Caps  Take 1,200 mg by mouth daily.     HYDROcodone-acetaminophen 5-325 MG tablet  Commonly known as:  NORCO/VICODIN  Take one tablet by mouth twice daily for pain. Do not exceed 4gm of Tylenol in 24 hours     ipratropium 0.03 % nasal spray  Commonly known as:  ATROVENT  Place 2 sprays into both nostrils daily as needed (For asthma).     ipratropium-albuterol 0.5-2.5 (3) MG/3ML Soln  Commonly known as:  DUONEB  Take 3 mLs by nebulization every 8 (eight) hours as needed. For shortness of breath     levothyroxine 50 MCG tablet  Commonly known as:  SYNTHROID, LEVOTHROID  Take 50 mcg by mouth daily.     lidocaine 5 %  Commonly known as:  LIDODERM  Place 1 patch onto the skin daily. Remove & Discard patch within 12 hours or as directed by MD     LORazepam 0.5 MG tablet  Commonly known as:   ATIVAN  Take one tablet by mouth every night at bedtime for anxiety     methocarbamol 500 MG tablet  Commonly known as:  ROBAXIN  Take 500 mg by mouth at bedtime.     metoprolol 50 MG tablet  Commonly known as:  LOPRESSOR  Take 50 mg by mouth 2 (two) times daily.     montelukast 10 MG tablet  Commonly known as:  SINGULAIR  Take 10 mg by mouth at bedtime.     nitroGLYCERIN 0.4 MG SL tablet  Commonly known as:  NITROSTAT  Place 0.4 mg under the tongue every 5 (five) minutes as needed for chest pain.     ondansetron 4 MG tablet  Commonly known as:  ZOFRAN  Take 4 mg by mouth every 6 (six) hours as needed for nausea or vomiting.     OXYGEN  Inhale 2 L into the lungs.     PATADAY 0.2 % Soln  Generic drug:  Olopatadine HCl  Place 1 drop into both eyes daily.     predniSONE 10 MG tablet  Commonly known as:  DELTASONE  Take 10 mg by mouth daily.     promethazine 25 MG tablet  Commonly known as:  PHENERGAN  Take 25 mg by mouth 2 (two) times daily as needed for nausea.     rosuvastatin 20 MG tablet  Commonly known as:  CRESTOR  Take 20 mg by mouth every evening.     saccharomyces boulardii 250 MG capsule  Commonly known as:  FLORASTOR  Take 250 mg by mouth 2 (two) times daily.     sertraline 50 MG tablet  Commonly known as:  ZOLOFT  Take 50 mg by mouth every morning.     Vitamin D 2000 units Caps  Take 1 capsule (2,000 Units total) by mouth daily.         Physical Exam: Filed Vitals:   12/19/15 1132  BP: 165/78  Pulse: 75  Temp: 98.3 F (36.8 C)  TempSrc: Oral  Resp: 19  Height: 5\' 6"  (1.676 m)  Weight: 143 lb 4.8 oz (65 kg)  SpO2: 96%   Body mass index is 23.14 kg/(m^2).  General- elderly female, well built, in no acute distress Head- normocephalic, atraumatic Throat- moist mucus membrane Eyes- PERRLA, EOMI, no pallor, no icterus, no nystagmus Neck- no cervical lymphadenopathy Cardiovascular- normal s1,s2, no murmurs, trace leg edema, good distal  pulse Respiratory- bilateral clear to auscultation, no wheeze, no rhonchi, no crackles, no use of accessory muscles, on o2 Abdomen- bowel sounds present, soft, non tender Musculoskeletal- able to move all 4 extremities, generalized weakness on wheelchair Neurological- alert and oriented to person, place and time Skin- warm and dry, easy bruising, has redness to tip of her right great toe with good capillary refill, no ope area or drainage noted, normal temperature, no swelling Psychiatry- normal mood and affect    Labs reviewed: Basic Metabolic Panel:  Recent Labs  07/24/15 1122 07/25/15 0706 07/26/15 0655 08/29/15 12/04/15  NA 143 141 139 144 145  K 3.0* 3.4* 3.3* 4.5 3.9  CL 107 109 108  --   --   CO2 21* 21* 19*  --   --   GLUCOSE 132* 83 87  --   --   BUN 20 18 12 21  29*  CREATININE 1.54* 1.52* 1.38* 1.3* 1.4*  CALCIUM 8.5* 8.4* 8.1*  --   --   MG  --  1.4*  --   --   --    Liver Function Tests:  Recent Labs  07/22/15 1123 07/23/15 0226 08/29/15  AST 23 29 19   ALT 15 13* 16  ALKPHOS 66 46  44  BILITOT 1.1 0.8  --   PROT 6.3* 4.9*  --   ALBUMIN 2.9* 2.1*  --    No results for input(s): LIPASE, AMYLASE in the last 8760 hours. No results for input(s): AMMONIA in the last 8760 hours. CBC:  Recent Labs  06/28/15 1555 07/22/15 1123  07/24/15 1122 07/25/15 0706 07/26/15 0655 08/29/15 12/04/15  WBC 13.9* 27.8*  < > 18.1* 17.5* 13.4* 9.0 17.5  NEUTROABS 12.5* 24.0*  --   --   --   --   --  13  HGB 12.4 13.0  < > 10.0* 10.5* 10.4* 9.1* 9.8*  HCT 38.2 39.7  < > 31.0* 32.6* 32.0* 30* 34*  MCV 87.5 88.6  < > 88.8 89.6 87.9  --   --   PLT 173.0 116*  < > 204 128* 131* 203 257  < > = values in this interval not displayed.  Lab Results  Component Value Date   TSH 0.61 09/20/2015      Assessment/Plan  callus  Has small raised area which could be thickened skin from ill fitting shoes. With concern for callus, get podiatry consult. Advised on wearing wide front  shoes. No signs of infection  Atherosclerotic PVD Has resting pain with blanchable redness. Dorsalis pedis palpable on exam. Get ABI to evaluate further for vascular supply with her history of CAD and CKD  Vertigo Start meclizine 12.5 mg bid and monitor. Check bp bid x 1 week  osteoporosis Has osteoporosis diagnosed with dexa scan in 2015. Patient mentions having received prolia injection last in May 2015. Will need to verify on last administration and will need this q 6 month.    Family/ staff Communication: reviewed care plan with patient and nursing supervisor    [name], MD Internal Medicine [place], [place] [address] Cell Phone (Monday-Friday 8 am - 5 pm): [phone] On Call: [phone] and follow prompts after 5 pm and on weekends Office Phone: [phone] Office Fax: [phone]

## 2015-12-20 DIAGNOSIS — M79674 Pain in right toe(s): Secondary | ICD-10-CM | POA: Diagnosis not present

## 2015-12-20 DIAGNOSIS — L539 Erythematous condition, unspecified: Secondary | ICD-10-CM | POA: Diagnosis not present

## 2015-12-28 ENCOUNTER — Non-Acute Institutional Stay (SKILLED_NURSING_FACILITY): Payer: Medicare Other | Admitting: Family

## 2015-12-28 DIAGNOSIS — M159 Polyosteoarthritis, unspecified: Secondary | ICD-10-CM

## 2015-12-28 DIAGNOSIS — M79641 Pain in right hand: Secondary | ICD-10-CM | POA: Diagnosis not present

## 2015-12-28 NOTE — Progress Notes (Signed)
Patient ID: [name], female   DOB: [birth year], 82 y.o.   MRN: [id number]  Location:   [facility] of Service:  SNF (31) Provider: Dinah Ngetich FNP-C   [name], MD  Patient Care Team: [name], MD as PCP - General (Internal Medicine) [name], NP as Nurse Practitioner (Nurse Practitioner)  Extended Emergency Contact Information Primary Emergency Contact: Mounger,[name] Address: [id number], [place] Phone: [phone] Mobile Phone: [phone] Relation: Son Secondary Emergency Contact: [name] [place] Phone: [phone] Mobile Phone: [phone] Relation: Daughter  Code Status:  Full Code  Goals of care: Advanced Directive information Advanced Directives 12/13/2015  Does patient have an advance directive? Yes  Type of Advance Directive (No Data)  Does patient want to make changes to advanced directive? No - Patient declined  Copy of advanced directive(s) in chart? Yes     Chief Complaint  Patient presents with  . Acute Visit    Hand pain     HPI:  Pt is a 82 y.o. female seen today [facility]  for an acute visit for right hand swelling and pain. She has a significant medical history of RA,Osteoporosis among others. She is seen in her room today per facility Nurse request who reports right hand swelling. Patient states no swelling today but states has arthritis. She denies any fever, chills or injury.    Past Medical History  Diagnosis Date  . Arthritis   . Right ear pain   . Renal insufficiency   . RA (rheumatoid arthritis) ([place])   . PUD (peptic ulcer disease)   . GI bleed 2005  . GERD (gastroesophageal reflux disease)   . Hiatal hernia   . Hypothyroidism   . Osteoporosis   . Depression   . Anxiety   . Coronary artery disease     a. 2012 s/p NSTEMI/Cath: 95% apical LAD dzs, otw nonobs dzs-->Med Rx;  b. 05/2011 MV: no  ischemia, EF 79%, inf infarct- attenuation.  . CVA (cerebral vascular accident) ([place])   . TIA (transient ischemic attack)   . Fall   . DVT of lower extremity (deep venous thrombosis) ([place])   . HCAP (healthcare-associated pneumonia) 01/22/2013  . Enteritis due to Clostridium difficile 01/04/2013  . Sepsis ([place]) 12/19/2012  . Recurrent colitis due to Clostridium difficile 06/02/2012  . Essential hypertension   . Hyperlipidemia   . Fatigue   . Weight loss   . Hemorrhoids   . Malnutrition ([place])   . Dyspnea   . Elevated troponin     a. 10/2014 in setting of sepsis/?HCAP.  Marland Kitchen Chronic diastolic CHF (congestive heart failure) ([place])     a. 10/2014 Echo: EF 60-65%, mild LVH, grade 1 DD, dynamic obstruction @ rest - peak velocity of 271 cm/sec, peak grad of 57mmHg, PASP 70mmHg.  Marland Kitchen Obstructive hypertrophic cardiomyopathy ([place])     a. 10/2014 Echo: EF 60-65%, mild LVH, grade 1 DD, dynamic obstruction @ rest - peak velocity of 271 cm/sec, peak grad of 68mmHg, PASP 52mmHg.   Past Surgical History  Procedure Laterality Date  . Nstemi  06/2010  . Spinal fusion surgery    . Knee surgery    . Cardiac catheterization      SHOWED RUPTURE PLAQUE IN THE LAD. THE LAD IS NONOBSTRUCTIVE WITH ONLY 30-40% STENOSIS  . Back surgery    .  Breast surgery  1964    x3  . Ankle surgery    . Colonoscopy      Allergies  Allergen Reactions  . Biphosphate   . Codeine Swelling  . Morphine And Related Other (See Comments)    unknown  . Percocet [Oxycodone-Acetaminophen]     unknown  . Plavix [Clopidogrel Bisulfate] Other (See Comments)    RASH  . Sulfur Other (See Comments)    GI upset      Medication List       This list is accurate as of: 12/28/15  6:05 PM.  Always use your most recent med list.               ACT DRY MOUTH Lozg  Use as directed in the mouth or throat as needed (for dry mouth).     aspirin 81 MG chewable tablet  Chew 81 mg by mouth daily.     calcium-vitamin D 500-200 MG-UNIT tablet    Commonly known as:  OSCAL WITH D  Take 1 tablet by mouth 3 (three) times daily.     CERTAVITE/ANTIOXIDANTS PO  Take 1 tablet by mouth daily.     cetirizine 10 MG tablet  Commonly known as:  ZYRTEC  Take 10 mg by mouth daily.     Cranberry 200 MG Caps  Take 2 capsules by mouth 2 (two) times daily.     denosumab 60 MG/ML Soln injection  Commonly known as:  PROLIA  Inject 60 mg into the skin every 6 (six) months. Administer in upper arm, thigh, or abdomen     diclofenac sodium 1 % Gel  Commonly known as:  VOLTAREN  Apply 2 g topically 4 (four) times daily. To left shoulder     ELIQUIS 5 MG Tabs tablet  Generic drug:  apixaban  Take 5 mg by mouth 2 (two) times daily.     esomeprazole 40 MG capsule  Commonly known as:  NEXIUM  Take 40 mg by mouth 2 (two) times daily before a meal. Take one capsule by mouth twice daily for stomach     ferrous sulfate 325 (65 FE) MG tablet  Take 325 mg by mouth 3 (three) times daily with meals.     Fish Oil 1200 MG Caps  Take 1,200 mg by mouth daily.     HYDROcodone-acetaminophen 5-325 MG tablet  Commonly known as:  NORCO/VICODIN  Take one tablet by mouth twice daily for pain. Do not exceed 4gm of Tylenol in 24 hours     ipratropium 0.03 % nasal spray  Commonly known as:  ATROVENT  Place 2 sprays into both nostrils daily as needed (For asthma).     ipratropium-albuterol 0.5-2.5 (3) MG/3ML Soln  Commonly known as:  DUONEB  Take 3 mLs by nebulization every 8 (eight) hours as needed. For shortness of breath     levothyroxine 50 MCG tablet  Commonly known as:  SYNTHROID, LEVOTHROID  Take 50 mcg by mouth daily.     lidocaine 5 %  Commonly known as:  LIDODERM  Place 1 patch onto the skin daily. Remove & Discard patch within 12 hours or as directed by MD     LORazepam 0.5 MG tablet  Commonly known as:  ATIVAN  Take one tablet by mouth every night at bedtime for anxiety     meclizine 12.5 MG tablet  Commonly known as:  ANTIVERT  Take  12.5 mg by mouth 2 (two) times daily.     methocarbamol 500  MG tablet  Commonly known as:  ROBAXIN  Take 500 mg by mouth at bedtime.     metoprolol 50 MG tablet  Commonly known as:  LOPRESSOR  Take 50 mg by mouth 2 (two) times daily.     montelukast 10 MG tablet  Commonly known as:  SINGULAIR  Take 10 mg by mouth at bedtime.     nitroGLYCERIN 0.4 MG SL tablet  Commonly known as:  NITROSTAT  Place 0.4 mg under the tongue every 5 (five) minutes as needed for chest pain.     ondansetron 4 MG tablet  Commonly known as:  ZOFRAN  Take 4 mg by mouth every 6 (six) hours as needed for nausea or vomiting.     OXYGEN  Inhale 2 L into the lungs.     PATADAY 0.2 % Soln  Generic drug:  Olopatadine HCl  Place 1 drop into both eyes daily.     predniSONE 10 MG tablet  Commonly known as:  DELTASONE  Take 10 mg by mouth daily.     promethazine 25 MG tablet  Commonly known as:  PHENERGAN  Take 25 mg by mouth 2 (two) times daily as needed for nausea.     rosuvastatin 20 MG tablet  Commonly known as:  CRESTOR  Take 20 mg by mouth every evening.     saccharomyces boulardii 250 MG capsule  Commonly known as:  FLORASTOR  Take 250 mg by mouth 2 (two) times daily.     sertraline 50 MG tablet  Commonly known as:  ZOLOFT  Take 50 mg by mouth every morning.     Vitamin D 2000 units Caps  Take 1 capsule (2,000 Units total) by mouth daily.        Review of Systems  Constitutional: Negative for fever, chills, activity change, appetite change and fatigue.  HENT: Negative for congestion, rhinorrhea, sinus pressure, sneezing and sore throat.   Eyes: Negative.   Respiratory: Negative.   Cardiovascular: Negative.   Gastrointestinal: Negative.   Musculoskeletal: Positive for arthralgias and gait problem.  Skin: Negative.   Psychiatric/Behavioral: Negative.     Immunization History  Administered Date(s) Administered  . Influenza-Unspecified 05/09/2013, 04/27/2014, 04/17/2015, 09/17/2015    . PPD Test 12/22/2012, 03/14/2014, 03/08/2015  . Pneumococcal-Unspecified 05/09/2013, 09/09/2013   Pertinent  Health Maintenance Due  Topic Date Due  . INFLUENZA VACCINE  01/29/2016  . DEXA SCAN  Completed  . PNA vac Low Risk Adult  Addressed   Fall Risk  10/04/2014  Falls in the past year? Yes  Number falls in past yr: 2 or more  Injury with Fall? No  Risk Factor Category  High Fall Risk  Risk for fall due to : Impaired balance/gait;Impaired mobility;Medication side effect;History of fall(s)  Follow up Education provided;Falls evaluation completed;Falls prevention discussed   Functional Status Survey:    Filed Vitals:   12/28/15 1100  BP: 147/76  Pulse: 80  Temp: 98.2 F (36.8 C)  Resp: 20  Height: 5\' 6"  (1.676 m)  Weight: 143 lb 12.8 oz (65.227 kg)  SpO2: 98%   Body mass index is 23.22 kg/(m^2). Physical Exam  Constitutional: She is oriented to person, place, and time. She appears well-developed and well-nourished. No distress.  Pleasant   HENT:  Head: Normocephalic and atraumatic.  Mouth/Throat: Oropharynx is clear and moist.  Eyes: Conjunctivae and EOM are normal. Pupils are equal, round, and reactive to light. Right eye exhibits no discharge. Left eye exhibits no discharge. No scleral icterus.  Neck: Normal range of motion. No JVD present. No thyromegaly present.  Cardiovascular: Normal rate, regular rhythm, normal heart sounds and intact distal pulses.  Exam reveals no gallop and no friction rub.   No murmur heard. Pulmonary/Chest: Effort normal and breath sounds normal. No respiratory distress. She has no wheezes. She has no rales.  Abdominal: Soft. Bowel sounds are normal. She exhibits no distension. There is no tenderness. There is no rebound and no guarding.  Musculoskeletal: She exhibits no edema or tenderness.  Slight limited ROM to UE/Knees. Arthritic changes to right hand joints and fingers. Unsteady gait.   Lymphadenopathy:    She has no cervical  adenopathy.  Neurological: She is oriented to person, place, and time.  Skin: Skin is warm and dry. No rash noted. No erythema.  Psychiatric: She has a normal mood and affect.    Labs reviewed:  Recent Labs  07/24/15 1122 07/25/15 0706 07/26/15 0655 08/29/15 12/04/15  NA 143 141 139 144 145  K 3.0* 3.4* 3.3* 4.5 3.9  CL 107 109 108  --   --   CO2 21* 21* 19*  --   --   GLUCOSE 132* 83 87  --   --   BUN 20 18 12 21  29*  CREATININE 1.54* 1.52* 1.38* 1.3* 1.4*  CALCIUM 8.5* 8.4* 8.1*  --   --   MG  --  1.4*  --   --   --     Recent Labs  07/22/15 1123 07/23/15 0226 08/29/15  AST 23 29 19   ALT 15 13* 16  ALKPHOS 66 46 44  BILITOT 1.1 0.8  --   PROT 6.3* 4.9*  --   ALBUMIN 2.9* 2.1*  --     Recent Labs  06/28/15 1555 07/22/15 1123  07/24/15 1122 07/25/15 0706 07/26/15 0655 08/29/15 12/04/15  WBC 13.9* 27.8*  < > 18.1* 17.5* 13.4* 9.0 17.5  NEUTROABS 12.5* 24.0*  --   --   --   --   --  13  HGB 12.4 13.0  < > 10.0* 10.5* 10.4* 9.1* 9.8*  HCT 38.2 39.7  < > 31.0* 32.6* 32.0* 30* 34*  MCV 87.5 88.6  < > 88.8 89.6 87.9  --   --   PLT 173.0 116*  < > 204 128* 131* 203 257  < > = values in this interval not displayed. Lab Results  Component Value Date   TSH 0.61 09/20/2015   No results found for: HGBA1C Lab Results  Component Value Date   CHOL 115 03/28/2015   HDL 47 03/28/2015   LDLCALC 41 03/28/2015   LDLDIRECT 60.7 11/13/2010   TRIG 133 03/28/2015   CHOLHDL 2.9 12/02/2011    Significant Diagnostic Results in last 30 days:  No results found.  Assessment/Plan 1. Hand joint pain, right Arthritic changes to fingers. Exam finding negative for joint swelling, redness or tenderness to touch. Start Volteran 1% gel to right hand wrist for pain. Continue Hydrocodone-APAP. Continue to monitor.   2. Generalized osteoarthritis Continue current pain regimen. Continue Restorative exercises and ROM.     Family/ staff Communication: Reviewed plan with patient and  facility Nurse supervisor.   Labs/tests ordered: None

## 2015-12-31 DIAGNOSIS — R05 Cough: Secondary | ICD-10-CM | POA: Diagnosis not present

## 2016-01-11 DIAGNOSIS — E08311 Diabetes mellitus due to underlying condition with unspecified diabetic retinopathy with macular edema: Secondary | ICD-10-CM | POA: Diagnosis not present

## 2016-01-11 DIAGNOSIS — D508 Other iron deficiency anemias: Secondary | ICD-10-CM | POA: Diagnosis not present

## 2016-01-11 DIAGNOSIS — E039 Hypothyroidism, unspecified: Secondary | ICD-10-CM | POA: Diagnosis not present

## 2016-01-11 LAB — HEMOGLOBIN A1C: Hemoglobin A1C: 6.4

## 2016-01-11 LAB — BASIC METABOLIC PANEL
BUN: 20 mg/dL (ref 4–21)
Creatinine: 1.2 mg/dL — AB (ref <=1.1)
Glucose: 79 mg/dL
POTASSIUM: 5 mmol/L (ref 3.4–5.3)
SODIUM: 147 mmol/L (ref 137–147)

## 2016-01-11 LAB — LIPID PANEL
CHOLESTEROL: 109 mg/dL (ref 0–200)
HDL: 37 mg/dL (ref 35–70)
LDL CALC: 39 mg/dL
TRIGLYCERIDES: 167 mg/dL — AB (ref 40–160)

## 2016-01-11 LAB — TSH: TSH: 0.62 u[IU]/mL (ref <=5.90)

## 2016-01-11 LAB — CBC AND DIFFERENTIAL
Platelets: 210 10*3/uL (ref 150–399)
WBC: 14.2 10^3/mL

## 2016-01-14 ENCOUNTER — Encounter: Payer: Self-pay | Admitting: Family

## 2016-01-14 ENCOUNTER — Non-Acute Institutional Stay (SKILLED_NURSING_FACILITY): Payer: Medicare Other | Admitting: Family

## 2016-01-14 DIAGNOSIS — D72829 Elevated white blood cell count, unspecified: Secondary | ICD-10-CM | POA: Diagnosis not present

## 2016-01-14 DIAGNOSIS — I5030 Unspecified diastolic (congestive) heart failure: Secondary | ICD-10-CM

## 2016-01-14 DIAGNOSIS — N183 Chronic kidney disease, stage 3 unspecified: Secondary | ICD-10-CM

## 2016-01-14 DIAGNOSIS — I130 Hypertensive heart and chronic kidney disease with heart failure and stage 1 through stage 4 chronic kidney disease, or unspecified chronic kidney disease: Secondary | ICD-10-CM

## 2016-01-14 MED ORDER — HYDROCHLOROTHIAZIDE 12.5 MG PO TABS: 12.5000 mg | ORAL_TABLET | Freq: Every day | ORAL | Status: DC

## 2016-01-14 NOTE — Progress Notes (Signed)
Location:   [facility] Room Number: 904-A Place of Service:  SNF (31) Provider:  Carlene Bickley FNP-C  [name], MD  Patient Care Team: [name], MD as PCP - General (Internal Medicine) [name], NP as Nurse Practitioner (Nurse Practitioner)  Extended Emergency Contact Information Primary Emergency Contact: Shuster,[name] Address: [id number] [place], [place] Phone: [phone] Mobile Phone: [phone] Relation: Son Secondary Emergency Contact: [name] [place] of [place] Phone: [phone] Mobile Phone: [phone] Relation: Daughter  Code Status: Full Code Goals of care: Advanced Directive information Advanced Directives 01/14/2016  Does patient have an advance directive? No  Type of Advance Directive -  Does patient want to make changes to advanced directive? -  Copy of advanced directive(s) in chart? -     Chief Complaint  Patient presents with  . Acute Visit    HPI:  Pt is a 82 y.o. female seen today at [facility]  for an acute visit for evaluation of abnormal lab results. She is seen in her room today. She denies any acute issues. Previous cough and shortness of breath from recent pneumonia resolved. Her Sys B/p in the 160's this visit. Her recent lab results WBC 14.2, Hgb 9.2 (01/11/2016).She denies any fever, chills or signs of urinary infections.     Past Medical History  Diagnosis Date  . Arthritis   . Right ear pain   . Renal insufficiency   . RA (rheumatoid arthritis) ([place])   . PUD (peptic ulcer disease)   . GI bleed 2005  . GERD (gastroesophageal reflux disease)   . Hiatal hernia   . Hypothyroidism   . Osteoporosis   . Depression   . Anxiety   . Coronary artery disease     a. 2012 s/p NSTEMI/Cath: 95% apical LAD dzs, otw nonobs dzs-->Med Rx;  b. 05/2011 MV: no ischemia, EF 79%, inf infarct- attenuation.  . CVA (cerebral  vascular accident) ([place])   . TIA (transient ischemic attack)   . Fall   . DVT of lower extremity (deep venous thrombosis) ([place])   . HCAP (healthcare-associated pneumonia) 01/22/2013  . Enteritis due to Clostridium difficile 01/04/2013  . Sepsis ([place]) 12/19/2012  . Recurrent colitis due to Clostridium difficile 06/02/2012  . Essential hypertension   . Hyperlipidemia   . Fatigue   . Weight loss   . Hemorrhoids   . Malnutrition ([place])   . Dyspnea   . Elevated troponin     a. 10/2014 in setting of sepsis/?HCAP.  Marland Kitchen Chronic diastolic CHF (congestive heart failure) ([place])     a. 10/2014 Echo: EF 60-65%, mild LVH, grade 1 DD, dynamic obstruction @ rest - peak velocity of 271 cm/sec, peak grad of 89mmHg, PASP 87mmHg.  Marland Kitchen Obstructive hypertrophic cardiomyopathy ([place])     a. 10/2014 Echo: EF 60-65%, mild LVH, grade 1 DD, dynamic obstruction @ rest - peak velocity of 271 cm/sec, peak grad of 49mmHg, PASP 26mmHg.   Past Surgical History  Procedure Laterality Date  . Nstemi  06/2010  . Spinal fusion surgery    . Knee surgery    . Cardiac catheterization      SHOWED RUPTURE PLAQUE IN THE LAD. THE LAD IS NONOBSTRUCTIVE WITH ONLY 30-40% STENOSIS  . Back surgery    . Breast surgery  1964    x3  . Ankle surgery    .  Colonoscopy      Allergies  Allergen Reactions  . Biphosphate   . Codeine Swelling  . Morphine And Related Other (See Comments)    unknown  . Percocet [Oxycodone-Acetaminophen]     unknown  . Plavix [Clopidogrel Bisulfate] Other (See Comments)    RASH  . Sulfur Other (See Comments)    GI upset      Medication List       This list is accurate as of: 01/14/16 10:58 AM.  Always use your most recent med list.               ACT DRY MOUTH Lozg  Use as directed in the mouth or throat as needed (for dry mouth).     aspirin 81 MG chewable tablet  Chew 81 mg by mouth daily.     calcium-vitamin D 500-200 MG-UNIT tablet  Commonly known as:  OSCAL WITH D  Take 1 tablet by mouth 3  (three) times daily.     CERTAVITE/ANTIOXIDANTS PO  Take 1 tablet by mouth daily.     cetirizine 10 MG tablet  Commonly known as:  ZYRTEC  Take 10 mg by mouth daily. For depression     Cranberry 200 MG Caps  Take 2 capsules by mouth 2 (two) times daily. UTI Prophylaxis     denosumab 60 MG/ML Soln injection  Commonly known as:  PROLIA  Inject 60 mg into the skin every 6 (six) months. Administer in upper arm, thigh, or abdomen     ELIQUIS 5 MG Tabs tablet  Generic drug:  apixaban  Take 5 mg by mouth 2 (two) times daily. For DVT     esomeprazole 40 MG capsule  Commonly known as:  NEXIUM  Take 40 mg by mouth 2 (two) times daily before a meal. Take one capsule by mouth twice daily for stomach     ferrous sulfate 325 (65 FE) MG tablet  Take 325 mg by mouth 3 (three) times daily with meals.     Fish Oil 1200 MG Caps  Take 1,200 mg by mouth daily.     HYDROcodone-acetaminophen 5-325 MG tablet  Commonly known as:  NORCO/VICODIN  Take one tablet by mouth twice daily for pain. Do not exceed 4gm of Tylenol in 24 hours     ipratropium 0.03 % nasal spray  Commonly known as:  ATROVENT  Place 2 sprays into both nostrils daily as needed (For asthma).     ipratropium-albuterol 0.5-2.5 (3) MG/3ML Soln  Commonly known as:  DUONEB  Take 3 mLs by nebulization every 8 (eight) hours as needed. For shortness of breath     levothyroxine 50 MCG tablet  Commonly known as:  SYNTHROID, LEVOTHROID  Take 50 mcg by mouth daily.     lidocaine 5 %  Commonly known as:  LIDODERM  Place 1 patch onto the skin daily. Remove & Discard patch within 12 hours or as directed by MD     LORazepam 0.5 MG tablet  Commonly known as:  ATIVAN  Take one tablet by mouth every night at bedtime for anxiety     meclizine 12.5 MG tablet  Commonly known as:  ANTIVERT  Take 12.5 mg by mouth 2 (two) times daily.     methocarbamol 500 MG tablet  Commonly known as:  ROBAXIN  Take 500 mg by mouth at bedtime. For muscle  spasms/shoulder pain.     metoprolol 50 MG tablet  Commonly known as:  LOPRESSOR  Take 50 mg by  mouth 2 (two) times daily.     montelukast 10 MG tablet  Commonly known as:  SINGULAIR  Take 10 mg by mouth at bedtime.     nitroGLYCERIN 0.4 MG SL tablet  Commonly known as:  NITROSTAT  Place 0.4 mg under the tongue every 5 (five) minutes as needed for chest pain.     ondansetron 4 MG tablet  Commonly known as:  ZOFRAN  Take 4 mg by mouth every 6 (six) hours as needed for nausea or vomiting.     OXYGEN  Inhale 2 L into the lungs.     PATADAY 0.2 % Soln  Generic drug:  Olopatadine HCl  Place 1 drop into both eyes daily.     predniSONE 10 MG tablet  Commonly known as:  DELTASONE  Take 10 mg by mouth daily. For RA     rosuvastatin 20 MG tablet  Commonly known as:  CRESTOR  Take 20 mg by mouth every evening. For hyperlipidemia     sertraline 50 MG tablet  Commonly known as:  ZOLOFT  Take 50 mg by mouth every morning. For depression     Vitamin D 2000 units Caps  Take 1 capsule (2,000 Units total) by mouth daily.        Review of Systems  Constitutional: Negative for fever, chills, activity change, appetite change and fatigue.  HENT: Negative for congestion, rhinorrhea, sinus pressure, sneezing and sore throat.   Eyes: Negative.   Respiratory: Negative for cough, chest tightness, shortness of breath and wheezing.   Cardiovascular: Negative.   Gastrointestinal: Negative for nausea, vomiting, abdominal pain, diarrhea, constipation and abdominal distention.  Endocrine: Negative.   Genitourinary: Negative for dysuria, urgency, frequency and flank pain.  Musculoskeletal: Positive for gait problem.  Skin: Negative.   Neurological: Negative for dizziness, seizures, syncope, light-headedness and headaches.  Psychiatric/Behavioral: Negative for hallucinations, confusion, sleep disturbance and agitation. The patient is not nervous/anxious.     Immunization History  Administered  Date(s) Administered  . Influenza-Unspecified 05/09/2013, 04/27/2014, 04/17/2015, 09/17/2015  . PPD Test 12/22/2012, 03/14/2014, 03/08/2015  . Pneumococcal-Unspecified 05/09/2013, 09/09/2013   Pertinent  Health Maintenance Due  Topic Date Due  . INFLUENZA VACCINE  01/29/2016  . DEXA SCAN  Completed  . PNA vac Low Risk Adult  Addressed   Fall Risk  10/04/2014  Falls in the past year? Yes  Number falls in past yr: 2 or more  Injury with Fall? No  Risk Factor Category  High Fall Risk  Risk for fall due to : Impaired balance/gait;Impaired mobility;Medication side effect;History of fall(s)  Follow up Education provided;Falls evaluation completed;Falls prevention discussed   Functional Status Survey:    Filed Vitals:   01/14/16 1011  BP: 164/81  Pulse: 77  Temp: 97.6 F (36.4 C)  Resp: 18  Height: 5\' 6"  (1.676 m)  Weight: 143 lb 12.8 oz (65.227 kg)  SpO2: 98%   Body mass index is 23.22 kg/(m^2). Physical Exam  Constitutional: She is oriented to person, place, and time. She appears well-developed and well-nourished. No distress.  HENT:  Head: Normocephalic and atraumatic.  Mouth/Throat: Oropharynx is clear and moist.  Eyes: Conjunctivae and EOM are normal. Pupils are equal, round, and reactive to light. Right eye exhibits no discharge. Left eye exhibits no discharge. No scleral icterus.  Neck: Normal range of motion. No JVD present. No thyromegaly present.  Cardiovascular: Normal rate, regular rhythm, normal heart sounds and intact distal pulses.  Exam reveals no gallop and no friction rub.  No murmur heard. Pulmonary/Chest: Effort normal and breath sounds normal. No respiratory distress. She has no wheezes. She has no rales.  Abdominal: Soft. Bowel sounds are normal. She exhibits no distension. There is no tenderness. There is no rebound and no guarding.  Musculoskeletal: She exhibits no edema or tenderness.  Slight limited ROM to UE/Knees. Arthritic changes to right hand  joints and fingers. Unsteady gait self propels.   Lymphadenopathy:    She has no cervical adenopathy.  Neurological: She is oriented to person, place, and time.  Skin: Skin is warm and dry. No rash noted. No erythema.  Psychiatric: She has a normal mood and affect.    Labs reviewed:  Recent Labs  07/24/15 1122 07/25/15 0706 07/26/15 0655 08/29/15 12/04/15 01/11/16  NA 143 141 139 144 145 147  K 3.0* 3.4* 3.3* 4.5 3.9 5.0  CL 107 109 108  --   --   --   CO2 21* 21* 19*  --   --   --   GLUCOSE 132* 83 87  --   --   --   BUN 20 18 12 21  29* 20  CREATININE 1.54* 1.52* 1.38* 1.3* 1.4* 1.2*  CALCIUM 8.5* 8.4* 8.1*  --   --   --   MG  --  1.4*  --   --   --   --     Recent Labs  07/22/15 1123 07/23/15 0226 08/29/15  AST 23 29 19   ALT 15 13* 16  ALKPHOS 66 46 44  BILITOT 1.1 0.8  --   PROT 6.3* 4.9*  --   ALBUMIN 2.9* 2.1*  --     Recent Labs  06/28/15 1555 07/22/15 1123  07/24/15 1122 07/25/15 0706 07/26/15 0655 08/29/15 12/04/15 01/11/16  WBC 13.9* 27.8*  < > 18.1* 17.5* 13.4* 9.0 17.5 14.2  NEUTROABS 12.5* 24.0*  --   --   --   --   --  13  --   HGB 12.4 13.0  < > 10.0* 10.5* 10.4* 9.1* 9.8*  --   HCT 38.2 39.7  < > 31.0* 32.6* 32.0* 30* 34*  --   MCV 87.5 88.6  < > 88.8 89.6 87.9  --   --   --   PLT 173.0 116*  < > 204 128* 131* 203 257 210  < > = values in this interval not displayed. Lab Results  Component Value Date   TSH 0.62 01/11/2016   Lab Results  Component Value Date   HGBA1C 6.4 01/11/2016   Lab Results  Component Value Date   CHOL 109 01/11/2016   HDL 37 01/11/2016   LDLCALC 39 01/11/2016   LDLDIRECT 60.7 11/13/2010   TRIG 167* 01/11/2016   CHOLHDL 2.9 12/02/2011    Significant Diagnostic Results in last 30 days:  No results found.  Assessment/Plan HTN Sys B/p in the 160's this visit. Continue on Metoprolol 50 mg Tablet twice daily. Add Hydrochlorothiazide 12.5 mg Tablet daily. Monitor B/p and HR twice daily x 5 weeks then resume  previous orders.   Leukocytosis   WBC 14.2 ( 01/11/2016).Afebrile.currently on Prednisone. Previous coughing and wheezing resolved.No acute issues this visit. Obtain U/A and C/S. Portable CXR Pa/Lat follow up pneumonia 01/25/2016.    Anemia  Hgb 9.2 ( 01/11/2016)Has improved previous 8.6 ( 12/07/2015). continue on Ferrous sulfate 325 mg Table three times daily. Monitor CBC.   Family/ staff Communication:  Labs/tests ordered:  CBC/diff 01/17/2016, CXR Pa /Lat 01/25/2016

## 2016-01-15 DIAGNOSIS — N390 Urinary tract infection, site not specified: Secondary | ICD-10-CM | POA: Diagnosis not present

## 2016-01-16 DIAGNOSIS — M79675 Pain in left toe(s): Secondary | ICD-10-CM | POA: Diagnosis not present

## 2016-01-16 DIAGNOSIS — B351 Tinea unguium: Secondary | ICD-10-CM | POA: Diagnosis not present

## 2016-01-16 DIAGNOSIS — L84 Corns and callosities: Secondary | ICD-10-CM | POA: Diagnosis not present

## 2016-01-16 DIAGNOSIS — I70203 Unspecified atherosclerosis of native arteries of extremities, bilateral legs: Secondary | ICD-10-CM | POA: Diagnosis not present

## 2016-01-17 DIAGNOSIS — D508 Other iron deficiency anemias: Secondary | ICD-10-CM | POA: Diagnosis not present

## 2016-01-17 LAB — CBC AND DIFFERENTIAL
HCT: 30 % — AB (ref 36–46)
Hemoglobin: 8.9 g/dL — AB (ref 12.0–16.0)
PLATELETS: 164 10*3/uL (ref 150–399)
WBC: 9.1 10*3/mL

## 2016-01-22 ENCOUNTER — Encounter: Payer: Self-pay | Admitting: Family

## 2016-01-22 ENCOUNTER — Non-Acute Institutional Stay (SKILLED_NURSING_FACILITY): Payer: Medicare Other | Admitting: Family

## 2016-01-22 DIAGNOSIS — N390 Urinary tract infection, site not specified: Secondary | ICD-10-CM | POA: Diagnosis not present

## 2016-01-22 DIAGNOSIS — I5032 Chronic diastolic (congestive) heart failure: Secondary | ICD-10-CM | POA: Diagnosis not present

## 2016-01-22 DIAGNOSIS — E039 Hypothyroidism, unspecified: Secondary | ICD-10-CM | POA: Diagnosis not present

## 2016-01-22 DIAGNOSIS — E785 Hyperlipidemia, unspecified: Secondary | ICD-10-CM

## 2016-01-22 DIAGNOSIS — I130 Hypertensive heart and chronic kidney disease with heart failure and stage 1 through stage 4 chronic kidney disease, or unspecified chronic kidney disease: Secondary | ICD-10-CM

## 2016-01-22 DIAGNOSIS — I5030 Unspecified diastolic (congestive) heart failure: Secondary | ICD-10-CM | POA: Diagnosis not present

## 2016-01-22 DIAGNOSIS — N183 Chronic kidney disease, stage 3 unspecified: Secondary | ICD-10-CM

## 2016-01-22 DIAGNOSIS — F419 Anxiety disorder, unspecified: Secondary | ICD-10-CM | POA: Diagnosis not present

## 2016-01-22 NOTE — Progress Notes (Signed)
Location:    [facility] Room Number: 904-A Place of Service:  SNF (31) Provider:  Dinah Ngetich FNP-C   [name], MD  Patient Care Team: [name], MD as PCP - General (Internal Medicine) [name], NP as Nurse Practitioner (Nurse Practitioner)  Extended Emergency Contact Information Primary Emergency Contact: Raysor,[name] Address: [id number], [place] Phone: [phone] Mobile Phone: [phone] Relation: Son Secondary Emergency Contact: [name] [place] Phone: [phone] Mobile Phone: [phone] Relation: Daughter  Code Status:  Full Code  Goals of care: Advanced Directive information Advanced Directives 01/14/2016  Does patient have an advance directive? No  Type of Advance Directive -  Does patient want to make changes to advanced directive? No - Patient declined  Copy of advanced directive(s) in chart? Yes  Would patient like information on creating an advanced directive? -  Pre-existing out of facility DNR order (yellow form or pink MOST form) -     Chief Complaint  Patient presents with  . Medical Management of Chronic Issues    Routine Visit    HPI:  Pt is a 82 y.o. female seen today at [facility]  for medical management of chronic diseases. She is seen in her room today. She denies any acute issues this visit. She reports no recent episodes of fall or hospital admission since last visit. She continues to self wheel on wheelchair and performs own morning care. Facility Nurse reports no new concerns.    Past Medical History:  Diagnosis Date  . Anxiety   . Arthritis   . Chronic diastolic CHF (congestive heart failure) ([place])    a. 10/2014 Echo: EF 60-65%, mild LVH, grade 1 DD, dynamic obstruction @ rest - peak velocity of 271 cm/sec, peak grad of 53mmHg, PASP 28mmHg.  Marland Kitchen Coronary artery disease    a. 2012 s/p  NSTEMI/Cath: 95% apical LAD dzs, otw nonobs dzs-->Med Rx;  b. 05/2011 MV: no ischemia, EF 79%, inf infarct- attenuation.  . CVA (cerebral vascular accident) ([place])   . Depression   . DVT of lower extremity (deep venous thrombosis) ([place])   . Dyspnea   . Elevated troponin    a. 10/2014 in setting of sepsis/?HCAP.  Marland Kitchen Enteritis due to Clostridium difficile 01/04/2013  . Essential hypertension   . Fall   . Fatigue   . GERD (gastroesophageal reflux disease)   . GI bleed 2005  . HCAP (healthcare-associated pneumonia) 01/22/2013  . Hemorrhoids   . Hiatal hernia   . Hyperlipidemia   . Hypothyroidism   . Malnutrition ([place])   . Obstructive hypertrophic cardiomyopathy ([place])    a. 10/2014 Echo: EF 60-65%, mild LVH, grade 1 DD, dynamic obstruction @ rest - peak velocity of 271 cm/sec, peak grad of 72mmHg, PASP 26mmHg.  . Osteoporosis   . PUD (peptic ulcer disease)   . RA (rheumatoid arthritis) ([place])   . Recurrent colitis due to Clostridium difficile 06/02/2012  . Renal insufficiency   . Right ear pain   . Sepsis ([place]) 12/19/2012  . TIA (transient ischemic attack)   . Weight loss    Past Surgical History:  Procedure Laterality Date  . ANKLE SURGERY    . BACK SURGERY    . [place]   x3  . CARDIAC CATHETERIZATION     SHOWED RUPTURE PLAQUE IN THE LAD. THE LAD  IS NONOBSTRUCTIVE WITH ONLY 30-40% STENOSIS  . COLONOSCOPY    . KNEE SURGERY    . NSTEMI  06/2010  . SPINAL FUSION SURGERY      Allergies  Allergen Reactions  . Biphosphate   . Codeine Swelling  . Morphine And Related Other (See Comments)    unknown  . Percocet [Oxycodone-Acetaminophen]     unknown  . Plavix [Clopidogrel Bisulfate] Other (See Comments)    RASH  . Sulfur Other (See Comments)    GI upset      Medication List       Accurate as of 01/22/16  4:16 PM. Always use your most recent med list.          ACT DRY MOUTH Lozg Use as directed in the mouth or throat as needed (for dry mouth).   aspirin 81 MG  chewable tablet Chew 81 mg by mouth daily.   calcium-vitamin D 500-200 MG-UNIT tablet Commonly known as:  OSCAL WITH D Take 1 tablet by mouth 3 (three) times daily.   CERTAVITE/ANTIOXIDANTS PO Take 1 tablet by mouth daily.   cetirizine 10 MG tablet Commonly known as:  ZYRTEC Take 10 mg by mouth daily. For depression   Cranberry 200 MG Caps Take 2 capsules by mouth 2 (two) times daily. UTI Prophylaxis   denosumab 60 MG/ML Soln injection Commonly known as:  PROLIA Inject 60 mg into the skin every 6 (six) months. Administer in upper arm, thigh, or abdomen   ELIQUIS 5 MG Tabs tablet Generic drug:  apixaban Take 5 mg by mouth 2 (two) times daily. For DVT   esomeprazole 40 MG capsule Commonly known as:  NEXIUM Take 40 mg by mouth 2 (two) times daily before a meal. Take one capsule by mouth twice daily for stomach   ferrous sulfate 325 (65 FE) MG tablet Take 325 mg by mouth 3 (three) times daily with meals.   Fish Oil 1200 MG Caps Take 1,200 mg by mouth daily.   hydrochlorothiazide 12.5 MG tablet Commonly known as:  HYDRODIURIL Take 1 tablet (12.5 mg total) by mouth daily.   HYDROcodone-acetaminophen 5-325 MG tablet Commonly known as:  NORCO/VICODIN Take 1 tablet by mouth every 8 (eight) hours as needed for moderate pain.   HYDROcodone-acetaminophen 5-325 MG tablet Commonly known as:  NORCO/VICODIN Take one tablet by mouth twice daily for pain. Do not exceed 4gm of Tylenol in 24 hours   ipratropium 0.03 % nasal spray Commonly known as:  ATROVENT Place 2 sprays into both nostrils daily as needed (For asthma).   ipratropium-albuterol 0.5-2.5 (3) MG/3ML Soln Commonly known as:  DUONEB Take 3 mLs by nebulization every 8 (eight) hours as needed. For shortness of breath   levothyroxine 50 MCG tablet Commonly known as:  SYNTHROID, LEVOTHROID Take 50 mcg by mouth daily.   lidocaine 5 % Commonly known as:  LIDODERM Place 1 patch onto the skin daily. Remove & Discard patch  within 12 hours or as directed by MD   LORazepam 0.5 MG tablet Commonly known as:  ATIVAN Take one tablet by mouth every night at bedtime for anxiety   meclizine 12.5 MG tablet Commonly known as:  ANTIVERT Take 12.5 mg by mouth 2 (two) times daily.   methocarbamol 500 MG tablet Commonly known as:  ROBAXIN Take 500 mg by mouth at bedtime. For muscle spasms/shoulder pain.   metoprolol 50 MG tablet Commonly known as:  LOPRESSOR Take 50 mg by mouth 2 (two) times daily.   montelukast 10  MG tablet Commonly known as:  SINGULAIR Take 10 mg by mouth at bedtime.   nitroGLYCERIN 0.4 MG SL tablet Commonly known as:  NITROSTAT Place 0.4 mg under the tongue every 5 (five) minutes as needed for chest pain.   ondansetron 4 MG tablet Commonly known as:  ZOFRAN Take 4 mg by mouth every 6 (six) hours as needed for nausea or vomiting.   OXYGEN Inhale 2 L into the lungs.   PATADAY 0.2 % Soln Generic drug:  Olopatadine HCl Place 1 drop into both eyes daily.   predniSONE 10 MG tablet Commonly known as:  DELTASONE Take 10 mg by mouth daily. For RA   promethazine 25 MG tablet Commonly known as:  PHENERGAN Take 25 mg by mouth every 12 (twelve) hours as needed for nausea or vomiting.   rosuvastatin 20 MG tablet Commonly known as:  CRESTOR Take 20 mg by mouth every evening. For hyperlipidemia   sertraline 50 MG tablet Commonly known as:  ZOLOFT Take 50 mg by mouth every morning. For depression   Vitamin D 2000 units Caps Take 1 capsule (2,000 Units total) by mouth daily.       Review of Systems  Constitutional: Negative for activity change, appetite change, chills, fatigue and fever.  HENT: Negative for congestion, rhinorrhea, sinus pressure, sneezing and sore throat.   Eyes: Negative.   Respiratory: Negative for cough, chest tightness, shortness of breath and wheezing.   Cardiovascular: Negative.   Gastrointestinal: Negative for abdominal distention, abdominal pain,  constipation, diarrhea, nausea and vomiting.  Endocrine: Negative.   Genitourinary: Negative for dysuria, flank pain, frequency and urgency.  Musculoskeletal: Positive for gait problem.  Skin: Negative.   Neurological: Negative for dizziness, seizures, syncope, light-headedness and headaches.  Psychiatric/Behavioral: Negative for agitation, confusion, hallucinations and sleep disturbance. The patient is not nervous/anxious.     Immunization History  Administered Date(s) Administered  . Influenza-Unspecified 05/09/2013, 04/27/2014, 04/17/2015, 09/17/2015  . PPD Test 12/22/2012, 03/14/2014, 03/08/2015  . Pneumococcal-Unspecified 05/09/2013, 09/09/2013   Pertinent  Health Maintenance Due  Topic Date Due  . INFLUENZA VACCINE  01/29/2016  . DEXA SCAN  Completed  . PNA vac Low Risk Adult  Addressed   Fall Risk  10/04/2014  Falls in the past year? Yes  Number falls in past yr: 2 or more  Injury with Fall? No  Risk Factor Category  High Fall Risk  Risk for fall due to : Impaired balance/gait;Impaired mobility;Medication side effect;History of fall(s)  Follow up Education provided;Falls evaluation completed;Falls prevention discussed   Functional Status Survey:    Vitals:   01/22/16 0911  BP: (!) 158/84  Pulse: (!) 56  Resp: 16  Temp: 97.6 F (36.4 C)  TempSrc: Oral  SpO2: 94%  Weight: 143 lb 12.8 oz (65.2 kg)  Height: 5\' 6"  (1.676 m)   Body mass index is 23.21 kg/m. Physical Exam  Constitutional: She is oriented to person, place, and time. She appears well-developed and well-nourished. No distress.  Elderly  HENT:  Head: Normocephalic and atraumatic.  Mouth/Throat: Oropharynx is clear and moist.  Eyes: Conjunctivae and EOM are normal. Pupils are equal, round, and reactive to light. Right eye exhibits no discharge. Left eye exhibits no discharge. No scleral icterus.  Neck: Normal range of motion. No JVD present. No thyromegaly present.  Cardiovascular: Normal rate, regular  rhythm, normal heart sounds and intact distal pulses.  Exam reveals no gallop and no friction rub.   No murmur heard. Pulmonary/Chest: Effort normal and breath sounds normal.  No respiratory distress. She has no wheezes. She has no rales.  Abdominal: Soft. Bowel sounds are normal. She exhibits no distension. There is no tenderness. There is no rebound and no guarding.  Musculoskeletal: She exhibits no edema or tenderness.  Limited ROM due to Arthritis. Unsteady gait.   Lymphadenopathy:    She has no cervical adenopathy.  Neurological: She is oriented to person, place, and time.  Skin: Skin is warm and dry. No rash noted. No erythema.  Psychiatric: She has a normal mood and affect.    Labs reviewed:  Recent Labs  07/24/15 1122 07/25/15 0706 07/26/15 0655 08/29/15 12/04/15 01/11/16  NA 143 141 139 144 145 147  K 3.0* 3.4* 3.3* 4.5 3.9 5.0  CL 107 109 108  --   --   --   CO2 21* 21* 19*  --   --   --   GLUCOSE 132* 83 87  --   --   --   BUN 20 18 12 21  29* 20  CREATININE 1.54* 1.52* 1.38* 1.3* 1.4* 1.2*  CALCIUM 8.5* 8.4* 8.1*  --   --   --   MG  --  1.4*  --   --   --   --     Recent Labs  07/22/15 1123 07/23/15 0226 08/29/15  AST 23 29 19   ALT 15 13* 16  ALKPHOS 66 46 44  BILITOT 1.1 0.8  --   PROT 6.3* 4.9*  --   ALBUMIN 2.9* 2.1*  --     Recent Labs  06/28/15 1555 07/22/15 1123  07/24/15 1122 07/25/15 0706 07/26/15 0655 08/29/15 12/04/15 01/11/16 01/17/16  WBC 13.9* 27.8*  < > 18.1* 17.5* 13.4* 9.0 17.5 14.2 9.1  NEUTROABS 12.5* 24.0*  --   --   --   --   --  13  --   --   HGB 12.4 13.0  < > 10.0* 10.5* 10.4* 9.1* 9.8*  --  8.9*  HCT 38.2 39.7  < > 31.0* 32.6* 32.0* 30* 34*  --  30*  MCV 87.5 88.6  < > 88.8 89.6 87.9  --   --   --   --   PLT 173.0 116*  < > 204 128* 131* 203 257 210 164  < > = values in this interval not displayed. Lab Results  Component Value Date   TSH 0.62 01/11/2016   Lab Results  Component Value Date   HGBA1C 6.4 01/11/2016   Lab  Results  Component Value Date   CHOL 109 01/11/2016   HDL 37 01/11/2016   LDLCALC 39 01/11/2016   LDLDIRECT 60.7 11/13/2010   TRIG 167 (A) 01/11/2016   CHOLHDL 2.9 12/02/2011    Significant Diagnostic Results in last 30 days:  No results found.  Assessment/Plan 1. 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 Continue on current medication. Monitor BMP   2. Chronic diastolic CHF (congestive heart failure) (HCC) Asymptomatic. Continue to monitor weight.   3. Hypothyroidism, unspecified hypothyroidism type Continue on Levothyroxine 50 mcg Tablet. Monitor TSH level.   4. Anxiety  Stable. Continue on Lorazepam 0.5 mg tablet.   5. HLD (hyperlipidemia) Continue on Crestor 20 mg Tablet. Monitor lipid panel every 3 to 6 months.     Family/ staff Communication: Reviewed plan with patient and facility Nurse supervisor.   Labs/tests ordered: None

## 2016-01-23 ENCOUNTER — Other Ambulatory Visit: Payer: Self-pay | Admitting: *Deleted

## 2016-01-23 MED ORDER — HYDROCODONE-ACETAMINOPHEN 5-325 MG PO TABS: 1.0000 | ORAL_TABLET | Freq: Two times a day (BID) | ORAL | 0 refills | Status: DC

## 2016-01-23 NOTE — Telephone Encounter (Signed)
[facility] 

## 2016-01-24 ENCOUNTER — Non-Acute Institutional Stay (SKILLED_NURSING_FACILITY): Payer: Medicare Other | Admitting: Family

## 2016-01-24 DIAGNOSIS — J189 Pneumonia, unspecified organism: Secondary | ICD-10-CM

## 2016-01-24 DIAGNOSIS — M25531 Pain in right wrist: Secondary | ICD-10-CM | POA: Diagnosis not present

## 2016-01-24 DIAGNOSIS — M25431 Effusion, right wrist: Secondary | ICD-10-CM | POA: Diagnosis not present

## 2016-01-24 DIAGNOSIS — M10031 Idiopathic gout, right wrist: Secondary | ICD-10-CM | POA: Diagnosis not present

## 2016-01-24 LAB — CBC AND DIFFERENTIAL
HEMATOCRIT: 36 % (ref 36–46)
Hemoglobin: 11.3 g/dL — AB (ref 12.0–16.0)
PLATELETS: 184 10*3/uL (ref 150–399)
WBC: 19 10*3/mL

## 2016-01-24 NOTE — Progress Notes (Addendum)
Location:   [place] of Service:  SNF (31) Provider:  Dinah Ngetich FNP-C   [name], MD  Patient Care Team: [name], MD as PCP - General (Internal Medicine) [name], NP as Nurse Practitioner (Nurse Practitioner)  Extended Emergency Contact Information Primary Emergency Contact: Backhaus,[name] Address: [id number], [place] Phone: [phone] Mobile Phone: [phone] Relation: Son Secondary Emergency Contact: [name] [place] Phone: [phone] Mobile Phone: [phone] Relation: Daughter  Code Status:  Full Code  Goals of care: Advanced Directive information Advanced Directives 01/14/2016  Does patient have an advance directive? No  Type of Advance Directive -  Does patient want to make changes to advanced directive? No - Patient declined  Copy of advanced directive(s) in chart? Yes  Would patient like information on creating an advanced directive? -  Pre-existing out of facility DNR order (yellow form or pink MOST form) -     Chief Complaint  Patient presents with  . Acute Visit    right wrist pain/swelling     HPI:  Pt is a 82 y.o. female seen today at [facility] for an acute visit for evaluation of right wrist pain and swelling. She has a significant medical history of Rheumatoid Arthritis among other conditions. She is seen in her room today. She states right wrist move swollen and painful to move. She denies any fever, chills or injury to hand. Her follow up portable CXR results showed no change in previous left lower lung infiltrate. She completed her Augmentin course for 10 days.    Past Medical History:  Diagnosis Date  . Anxiety   . Arthritis   . Chronic diastolic CHF (congestive heart failure) ([place])    a. 10/2014 Echo: EF 60-65%, mild LVH, grade 1 DD, dynamic obstruction @ rest - peak velocity of 271 cm/sec, peak  grad of 62mmHg, PASP 75mmHg.  Marland Kitchen Coronary artery disease    a. 2012 s/p NSTEMI/Cath: 95% apical LAD dzs, otw nonobs dzs-->Med Rx;  b. 05/2011 MV: no ischemia, EF 79%, inf infarct- attenuation.  . CVA (cerebral vascular accident) ([place])   . Depression   . DVT of lower extremity (deep venous thrombosis) ([place])   . Dyspnea   . Elevated troponin    a. 10/2014 in setting of sepsis/?HCAP.  Marland Kitchen Enteritis due to Clostridium difficile 01/04/2013  . Essential hypertension   . Fall   . Fatigue   . GERD (gastroesophageal reflux disease)   . GI bleed 2005  . HCAP (healthcare-associated pneumonia) 01/22/2013  . Hemorrhoids   . Hiatal hernia   . Hyperlipidemia   . Hypothyroidism   . Malnutrition ([place])   . Obstructive hypertrophic cardiomyopathy ([place])    a. 10/2014 Echo: EF 60-65%, mild LVH, grade 1 DD, dynamic obstruction @ rest - peak velocity of 271 cm/sec, peak grad of 26mmHg, PASP 65mmHg.  . Osteoporosis   . PUD (peptic ulcer disease)   . RA (rheumatoid arthritis) ([place])   . Recurrent colitis due to Clostridium difficile 06/02/2012  . Renal insufficiency   . Right ear pain   . Sepsis ([place]) 12/19/2012  . TIA (transient ischemic attack)   . Weight loss    Past Surgical History:  Procedure Laterality Date  . ANKLE SURGERY    . BACK SURGERY    . [place]   x3  .  CARDIAC CATHETERIZATION     SHOWED RUPTURE PLAQUE IN THE LAD. THE LAD IS NONOBSTRUCTIVE WITH ONLY 30-40% STENOSIS  . COLONOSCOPY    . KNEE SURGERY    . NSTEMI  06/2010  . SPINAL FUSION SURGERY      Allergies  Allergen Reactions  . Biphosphate   . Codeine Swelling  . Morphine And Related Other (See Comments)    unknown  . Percocet [Oxycodone-Acetaminophen]     unknown  . Plavix [Clopidogrel Bisulfate] Other (See Comments)    RASH  . Sulfur Other (See Comments)    GI upset      Medication List       Accurate as of 01/24/16 11:59 PM. Always use your most recent med list.          ACT DRY MOUTH Lozg Use as  directed in the mouth or throat as needed (for dry mouth).   aspirin 81 MG chewable tablet Chew 81 mg by mouth daily.   calcium-vitamin D 500-200 MG-UNIT tablet Commonly known as:  OSCAL WITH D Take 1 tablet by mouth 3 (three) times daily.   CERTAVITE/ANTIOXIDANTS PO Take 1 tablet by mouth daily.   cetirizine 10 MG tablet Commonly known as:  ZYRTEC Take 10 mg by mouth daily. For depression   Cranberry 200 MG Caps Take 2 capsules by mouth 2 (two) times daily. UTI Prophylaxis   denosumab 60 MG/ML Soln injection Commonly known as:  PROLIA Inject 60 mg into the skin every 6 (six) months. Administer in upper arm, thigh, or abdomen   ELIQUIS 5 MG Tabs tablet Generic drug:  apixaban Take 5 mg by mouth 2 (two) times daily. For DVT   esomeprazole 40 MG capsule Commonly known as:  NEXIUM Take 40 mg by mouth 2 (two) times daily before a meal. Take one capsule by mouth twice daily for stomach   ferrous sulfate 325 (65 FE) MG tablet Take 325 mg by mouth 3 (three) times daily with meals.   Fish Oil 1200 MG Caps Take 1,200 mg by mouth daily.   hydrochlorothiazide 12.5 MG tablet Commonly known as:  HYDRODIURIL Take 1 tablet (12.5 mg total) by mouth daily.   HYDROcodone-acetaminophen 5-325 MG tablet Commonly known as:  NORCO/VICODIN Take one tablet by mouth twice daily for pain. Do not exceed 4gm of Tylenol in 24 hours   HYDROcodone-acetaminophen 5-325 MG tablet Commonly known as:  NORCO/VICODIN Take 1 tablet by mouth 2 (two) times daily. Do not exceed 4gm of Tylenol in 24 hours   ipratropium 0.03 % nasal spray Commonly known as:  ATROVENT Place 2 sprays into both nostrils daily as needed (For asthma).   ipratropium-albuterol 0.5-2.5 (3) MG/3ML Soln Commonly known as:  DUONEB Take 3 mLs by nebulization every 8 (eight) hours as needed. For shortness of breath   levothyroxine 50 MCG tablet Commonly known as:  SYNTHROID, LEVOTHROID Take 50 mcg by mouth daily.   lidocaine 5  % Commonly known as:  LIDODERM Place 1 patch onto the skin daily. Remove & Discard patch within 12 hours or as directed by MD   LORazepam 0.5 MG tablet Commonly known as:  ATIVAN Take one tablet by mouth every night at bedtime for anxiety   meclizine 12.5 MG tablet Commonly known as:  ANTIVERT Take 12.5 mg by mouth 2 (two) times daily.   methocarbamol 500 MG tablet Commonly known as:  ROBAXIN Take 500 mg by mouth at bedtime. For muscle spasms/shoulder pain.   metoprolol 50 MG tablet Commonly  known as:  LOPRESSOR Take 50 mg by mouth 2 (two) times daily.   montelukast 10 MG tablet Commonly known as:  SINGULAIR Take 10 mg by mouth at bedtime.   nitroGLYCERIN 0.4 MG SL tablet Commonly known as:  NITROSTAT Place 0.4 mg under the tongue every 5 (five) minutes as needed for chest pain.   ondansetron 4 MG tablet Commonly known as:  ZOFRAN Take 4 mg by mouth every 6 (six) hours as needed for nausea or vomiting.   OXYGEN Inhale 2 L into the lungs.   PATADAY 0.2 % Soln Generic drug:  Olopatadine HCl Place 1 drop into both eyes daily.   predniSONE 10 MG tablet Commonly known as:  DELTASONE Take 10 mg by mouth daily. For RA   promethazine 25 MG tablet Commonly known as:  PHENERGAN Take 25 mg by mouth every 12 (twelve) hours as needed for nausea or vomiting.   rosuvastatin 20 MG tablet Commonly known as:  CRESTOR Take 20 mg by mouth every evening. For hyperlipidemia   sertraline 50 MG tablet Commonly known as:  ZOLOFT Take 50 mg by mouth every morning. For depression   Vitamin D 2000 units Caps Take 1 capsule (2,000 Units total) by mouth daily.       Review of Systems  Constitutional: Negative for activity change, appetite change, chills, fatigue and fever.  HENT: Negative for congestion, rhinorrhea, sinus pressure, sneezing and sore throat.   Eyes: Negative.   Respiratory: Negative for cough, chest tightness, shortness of breath and wheezing.   Cardiovascular:  Negative.   Gastrointestinal: Negative for abdominal distention, abdominal pain, constipation, diarrhea, nausea and vomiting.  Musculoskeletal: Positive for gait problem and joint swelling.       Right wrist pain and swelling   Skin: Negative.   Psychiatric/Behavioral: Negative for agitation, confusion, hallucinations and sleep disturbance. The patient is not nervous/anxious.     Immunization History  Administered Date(s) Administered  . Influenza-Unspecified 05/09/2013, 04/27/2014, 04/17/2015, 09/17/2015  . PPD Test 12/22/2012, 03/14/2014, 03/08/2015  . Pneumococcal-Unspecified 05/09/2013, 09/09/2013   Pertinent  Health Maintenance Due  Topic Date Due  . INFLUENZA VACCINE  01/29/2016  . DEXA SCAN  Completed  . PNA vac Low Risk Adult  Addressed   Fall Risk  10/04/2014  Falls in the past year? Yes  Number falls in past yr: 2 or more  Injury with Fall? No  Risk Factor Category  High Fall Risk  Risk for fall due to : Impaired balance/gait;Impaired mobility;Medication side effect;History of fall(s)  Follow up Education provided;Falls evaluation completed;Falls prevention discussed   Functional Status Survey:    Vitals:   01/24/16 1751  BP: (!) 142/79  Pulse: 78  Resp: 18  Temp: 97.7 F (36.5 C)  SpO2: 98%  Weight: 139 lb (63 kg)  Height: 5\' 6"  (1.676 m)   Body mass index is 22.44 kg/m. Physical Exam  Constitutional: She is oriented to person, place, and time. She appears well-developed and well-nourished. No distress.  Elderly  HENT:  Head: Normocephalic and atraumatic.  Mouth/Throat: Oropharynx is clear and moist.  Eyes: Conjunctivae and EOM are normal. Pupils are equal, round, and reactive to light. Right eye exhibits no discharge. Left eye exhibits no discharge. No scleral icterus.  Neck: Normal range of motion. No JVD present. No thyromegaly present.  Cardiovascular: Normal rate, regular rhythm, normal heart sounds and intact distal pulses.  Exam reveals no gallop  and no friction rub.   No murmur heard. Pulmonary/Chest: Effort normal. No respiratory  distress. She has no wheezes. She has no rales.  Diminished bases  Abdominal: Soft. Bowel sounds are normal. She exhibits no distension. There is no tenderness. There is no rebound and no guarding.  Musculoskeletal: She exhibits no edema or tenderness.  Right wrist limited ROM pain. Unsteady gait.   Lymphadenopathy:    She has no cervical adenopathy.  Neurological: She is oriented to person, place, and time.  Skin: Skin is warm and dry. No rash noted. No erythema.  Right wrist swelling, red, warm and tender to touch.   Psychiatric: She has a normal mood and affect.    Labs reviewed:  Recent Labs  07/24/15 1122 07/25/15 0706 07/26/15 0655 08/29/15 12/04/15 01/11/16  NA 143 141 139 144 145 147  K 3.0* 3.4* 3.3* 4.5 3.9 5.0  CL 107 109 108  --   --   --   CO2 21* 21* 19*  --   --   --   GLUCOSE 132* 83 87  --   --   --   BUN 20 18 12 21  29* 20  CREATININE 1.54* 1.52* 1.38* 1.3* 1.4* 1.2*  CALCIUM 8.5* 8.4* 8.1*  --   --   --   MG  --  1.4*  --   --   --   --     Recent Labs  07/22/15 1123 07/23/15 0226 08/29/15  AST 23 29 19   ALT 15 13* 16  ALKPHOS 66 46 44  BILITOT 1.1 0.8  --   PROT 6.3* 4.9*  --   ALBUMIN 2.9* 2.1*  --     Recent Labs  06/28/15 1555 07/22/15 1123  07/24/15 1122 07/25/15 0706 07/26/15 0655 08/29/15 12/04/15 01/11/16 01/17/16  WBC 13.9* 27.8*  < > 18.1* 17.5* 13.4* 9.0 17.5 14.2 9.1  NEUTROABS 12.5* 24.0*  --   --   --   --   --  13  --   --   HGB 12.4 13.0  < > 10.0* 10.5* 10.4* 9.1* 9.8*  --  8.9*  HCT 38.2 39.7  < > 31.0* 32.6* 32.0* 30* 34*  --  30*  MCV 87.5 88.6  < > 88.8 89.6 87.9  --   --   --   --   PLT 173.0 116*  < > 204 128* 131* 203 257 210 164  < > = values in this interval not displayed. Lab Results  Component Value Date   TSH 0.62 01/11/2016   Lab Results  Component Value Date   HGBA1C 6.4 01/11/2016   Lab Results  Component Value  Date   CHOL 109 01/11/2016   HDL 37 01/11/2016   LDLCALC 39 01/11/2016   LDLDIRECT 60.7 11/13/2010   TRIG 167 (A) 01/11/2016   CHOLHDL 2.9 12/02/2011   Assessment/Plan  Pain and swelling of right wrist Afebrile. Right wrist red, warm and tender touch. Will obtain portable x-ray 3 views. Stat CBC/diff, uric Acid 01/24/2016. Biofreeze 4% gel apply to right wrist every 8 hours for pain. Continue Hydrocodone twice daily. Of note Stat right wrist X-ray was negative for fracture but showed Osteoarthritis. Continue above pain regimen. Consult PT/OT for kinesiotherapy for edema and pain.   Pneumonia  Afebrile.CXR results showed no change in previous left lower lung infiltrate. She completed her Augmentin course for 10 days. Start Doxycycline 100 mg Tablet twice daily X 10 days. Florastor 250 mg Capsule twice daily X 10 days. Follow up CXR two weeks post antibiotic completion.  Family/ staff Communication: Reviewed plan with patient, facility Nurse and Nurse supervisor.   Labs/tests ordered:  Stat CBC/diff, uric Acid 01/24/2016.

## 2016-01-25 DIAGNOSIS — Z09 Encounter for follow-up examination after completed treatment for conditions other than malignant neoplasm: Secondary | ICD-10-CM | POA: Diagnosis not present

## 2016-01-25 DIAGNOSIS — J189 Pneumonia, unspecified organism: Secondary | ICD-10-CM | POA: Diagnosis not present

## 2016-02-08 ENCOUNTER — Encounter: Payer: Self-pay | Admitting: Family

## 2016-02-08 ENCOUNTER — Non-Acute Institutional Stay (SKILLED_NURSING_FACILITY): Payer: Medicare Other | Admitting: Family

## 2016-02-08 DIAGNOSIS — K649 Unspecified hemorrhoids: Secondary | ICD-10-CM | POA: Diagnosis not present

## 2016-02-08 DIAGNOSIS — N939 Abnormal uterine and vaginal bleeding, unspecified: Secondary | ICD-10-CM | POA: Diagnosis not present

## 2016-02-08 NOTE — Progress Notes (Signed)
Location:   [facility] Room Number: [phone] Place of Service:  SNF (31) Provider:  [name], MD  Patient Care Team: [name], MD as PCP - General (Internal Medicine) [name], NP as Nurse Practitioner (Nurse Practitioner)  Extended Emergency Contact Information Primary Emergency Contact: Peffley,[name] Address: [id number] [place], [place] Phone: [phone] Mobile Phone: [phone] Relation: Son Secondary Emergency Contact: [name] [place] Phone: [phone] Mobile Phone: [phone] Relation: Daughter  Code Status:  Full Code Goals of care: Advanced Directive information Advanced Directives 02/08/2016  Does patient have an advance directive? No  Type of Advance Directive -  Does patient want to make changes to advanced directive? -  Copy of advanced directive(s) in chart? -  Would patient like information on creating an advanced directive? -  Pre-existing out of facility DNR order (yellow form or pink MOST form) -     Chief Complaint  Patient presents with  . Acute Visit    scant amount of vaginal bleeding noted, patient stated she had some burning in that area.     HPI:  Pt is a 82 y.o. female seen today at [facility] for an acute visit for evaluation of vaginal bleeding. She is seen in her room today per facility Nurse request.Patient states has had scant amount of vaginal bleeding noticed when she wiped her self. She is not sure whether bleeding was from the vaginal area or her hemorrhoids. She also states burning sensation on the vaginal area. She denies any pain, discharge or strong odor.    Past Medical History:  Diagnosis Date  . Anxiety   . Arthritis   . Chronic diastolic CHF (congestive heart failure) ([place])    a. 10/2014 Echo: EF 60-65%, mild LVH, grade 1 DD, dynamic obstruction @ rest - peak velocity of 271 cm/sec, peak grad  of 2mmHg, PASP 44mmHg.  Marland Kitchen Coronary artery disease    a. 2012 s/p NSTEMI/Cath: 95% apical LAD dzs, otw nonobs dzs-->Med Rx;  b. 05/2011 MV: no ischemia, EF 79%, inf infarct- attenuation.  . CVA (cerebral vascular accident) ([place])   . Depression   . DVT of lower extremity (deep venous thrombosis) ([place])   . Dyspnea   . Elevated troponin    a. 10/2014 in setting of sepsis/?HCAP.  Marland Kitchen Enteritis due to Clostridium difficile 01/04/2013  . Essential hypertension   . Fall   . Fatigue   . GERD (gastroesophageal reflux disease)   . GI bleed 2005  . HCAP (healthcare-associated pneumonia) 01/22/2013  . Hemorrhoids   . Hiatal hernia   . Hyperlipidemia   . Hypothyroidism   . Malnutrition ([place])   . Obstructive hypertrophic cardiomyopathy ([place])    a. 10/2014 Echo: EF 60-65%, mild LVH, grade 1 DD, dynamic obstruction @ rest - peak velocity of 271 cm/sec, peak grad of 28mmHg, PASP 20mmHg.  . Osteoporosis   . PUD (peptic ulcer disease)   . RA (rheumatoid arthritis) ([place])   . Recurrent colitis due to Clostridium difficile 06/02/2012  . Renal insufficiency   . Right ear pain   . Sepsis ([place]) 12/19/2012  . TIA (transient ischemic attack)   . Weight loss    Past Surgical History:  Procedure Laterality Date  . ANKLE SURGERY    . BACK SURGERY    . [place]   x3  .  CARDIAC CATHETERIZATION     SHOWED RUPTURE PLAQUE IN THE LAD. THE LAD IS NONOBSTRUCTIVE WITH ONLY 30-40% STENOSIS  . COLONOSCOPY    . KNEE SURGERY    . NSTEMI  06/2010  . SPINAL FUSION SURGERY      Allergies  Allergen Reactions  . Biphosphate   . Codeine Swelling  . Morphine And Related Other (See Comments)    unknown  . Percocet [Oxycodone-Acetaminophen]     unknown  . Plavix [Clopidogrel Bisulfate] Other (See Comments)    RASH  . Sulfur Other (See Comments)    GI upset      Medication List       Accurate as of 02/08/16 11:23 AM. Always use your most recent med list.          ACT DRY MOUTH Lozg Use as directed  in the mouth or throat as needed (for dry mouth).   aspirin 81 MG chewable tablet Chew 81 mg by mouth daily.   calcium-vitamin D 500-200 MG-UNIT tablet Commonly known as:  OSCAL WITH D Take 1 tablet by mouth 3 (three) times daily.   CERTAVITE/ANTIOXIDANTS PO Take 1 tablet by mouth daily.   cetirizine 10 MG tablet Commonly known as:  ZYRTEC Take 10 mg by mouth daily. For depression   Cranberry 200 MG Caps Take 2 capsules by mouth 2 (two) times daily. UTI Prophylaxis   denosumab 60 MG/ML Soln injection Commonly known as:  PROLIA Inject 60 mg into the skin every 6 (six) months. Administer in upper arm, thigh, or abdomen   ELIQUIS 5 MG Tabs tablet Generic drug:  apixaban Take 5 mg by mouth 2 (two) times daily. For DVT   esomeprazole 40 MG capsule Commonly known as:  NEXIUM Take 40 mg by mouth 2 (two) times daily before a meal. Take one capsule by mouth twice daily for stomach   ferrous sulfate 325 (65 FE) MG tablet Take 325 mg by mouth 3 (three) times daily with meals.   Fish Oil 1200 MG Caps Take 1,200 mg by mouth daily.   hydrochlorothiazide 12.5 MG tablet Commonly known as:  HYDRODIURIL Take 1 tablet (12.5 mg total) by mouth daily.   HYDROcodone-acetaminophen 5-325 MG tablet Commonly known as:  NORCO/VICODIN Take one tablet by mouth twice daily for pain. Do not exceed 4gm of Tylenol in 24 hours   ipratropium 0.03 % nasal spray Commonly known as:  ATROVENT Place 2 sprays into both nostrils daily as needed (For asthma).   ipratropium-albuterol 0.5-2.5 (3) MG/3ML Soln Commonly known as:  DUONEB Take 3 mLs by nebulization every 8 (eight) hours as needed. For shortness of breath   levothyroxine 50 MCG tablet Commonly known as:  SYNTHROID, LEVOTHROID Take 50 mcg by mouth daily.   lidocaine 5 % Commonly known as:  LIDODERM Place 1 patch onto the skin daily. Remove & Discard patch within 12 hours or as directed by MD   LORazepam 0.5 MG tablet Commonly known as:   ATIVAN Take one tablet by mouth every night at bedtime for anxiety   meclizine 12.5 MG tablet Commonly known as:  ANTIVERT Take 12.5 mg by mouth 2 (two) times daily.   methocarbamol 500 MG tablet Commonly known as:  ROBAXIN Take 500 mg by mouth at bedtime. For muscle spasms/shoulder pain.   metoprolol 50 MG tablet Commonly known as:  LOPRESSOR Take 50 mg by mouth 2 (two) times daily.   montelukast 10 MG tablet Commonly known as:  SINGULAIR Take 10 mg by mouth  at bedtime.   nitroGLYCERIN 0.4 MG SL tablet Commonly known as:  NITROSTAT Place 0.4 mg under the tongue every 5 (five) minutes as needed for chest pain.   ondansetron 4 MG tablet Commonly known as:  ZOFRAN Take 4 mg by mouth every 6 (six) hours as needed for nausea or vomiting.   OXYGEN Inhale 2 L into the lungs.   PATADAY 0.2 % Soln Generic drug:  Olopatadine HCl Place 1 drop into both eyes daily.   predniSONE 10 MG tablet Commonly known as:  DELTASONE Take 10 mg by mouth daily. For RA   promethazine 25 MG tablet Commonly known as:  PHENERGAN Take 25 mg by mouth every 12 (twelve) hours as needed for nausea or vomiting.   rosuvastatin 20 MG tablet Commonly known as:  CRESTOR Take 20 mg by mouth every evening. For hyperlipidemia   sertraline 50 MG tablet Commonly known as:  ZOLOFT Take 50 mg by mouth every morning. For depression   Vitamin D 2000 units Caps Take 1 capsule (2,000 Units total) by mouth daily.       Review of Systems  Constitutional: Negative for activity change, appetite change, chills, fatigue and fever.  HENT: Negative for congestion, rhinorrhea, sinus pressure, sneezing and sore throat.   Eyes: Negative.   Respiratory: Negative for cough, chest tightness, shortness of breath and wheezing.   Cardiovascular: Negative.   Gastrointestinal: Negative for abdominal distention, abdominal pain, constipation, diarrhea, nausea and vomiting.  Genitourinary: Negative for dysuria, flank pain,  frequency, urgency, vaginal discharge and vaginal pain.  Musculoskeletal: Positive for gait problem.       Right wrist pain and swelling   Skin: Negative.   Psychiatric/Behavioral: Negative for agitation, confusion, hallucinations and sleep disturbance. The patient is not nervous/anxious.     Immunization History  Administered Date(s) Administered  . Influenza-Unspecified 05/09/2013, 04/27/2014, 04/17/2015, 09/17/2015  . PPD Test 12/22/2012, 03/14/2014, 03/08/2015  . Pneumococcal-Unspecified 05/09/2013, 09/09/2013   Pertinent  Health Maintenance Due  Topic Date Due  . INFLUENZA VACCINE  01/29/2016  . DEXA SCAN  Completed  . PNA vac Low Risk Adult  Addressed   Fall Risk  10/04/2014  Falls in the past year? Yes  Number falls in past yr: 2 or more  Injury with Fall? No  Risk Factor Category  High Fall Risk  Risk for fall due to : Impaired balance/gait;Impaired mobility;Medication side effect;History of fall(s)  Follow up Education provided;Falls evaluation completed;Falls prevention discussed      Vitals:   02/08/16 1116  BP: (!) 155/78  Pulse: 78  Resp: 20  Temp: 98 F (36.7 C)  Weight: 138 lb (62.6 kg)  Height: 5\' 6"  (1.676 m)   Body mass index is 22.27 kg/m. Physical Exam  Constitutional: She is oriented to person, place, and time. She appears well-developed and well-nourished. No distress.  Elderly  HENT:  Head: Normocephalic and atraumatic.  Mouth/Throat: Oropharynx is clear and moist.  Eyes: Conjunctivae and EOM are normal. Pupils are equal, round, and reactive to light. Right eye exhibits no discharge. Left eye exhibits no discharge. No scleral icterus.  Neck: Normal range of motion. No JVD present. No thyromegaly present.  Cardiovascular: Normal rate, regular rhythm, normal heart sounds and intact distal pulses.  Exam reveals no gallop and no friction rub.   No murmur heard. Pulmonary/Chest: Effort normal. No respiratory distress. She has no wheezes. She has no  rales.  Abdominal: Soft. Bowel sounds are normal. She exhibits no distension. There is no tenderness.  There is no rebound and no guarding.  Genitourinary: Vagina normal. No vaginal discharge found.  Genitourinary Comments: Continent. External Hemorrhoids without any hematoma or bleeding.    Lymphadenopathy:    She has no cervical adenopathy.  Neurological: She is oriented to person, place, and time.  Skin: Skin is warm and dry. No rash noted. No erythema.  Psychiatric: She has a normal mood and affect.    Labs reviewed:  Recent Labs  07/24/15 1122 07/25/15 0706 07/26/15 0655 08/29/15 12/04/15 01/11/16  NA 143 141 139 144 145 147  K 3.0* 3.4* 3.3* 4.5 3.9 5.0  CL 107 109 108  --   --   --   CO2 21* 21* 19*  --   --   --   GLUCOSE 132* 83 87  --   --   --   BUN 20 18 12 21  29* 20  CREATININE 1.54* 1.52* 1.38* 1.3* 1.4* 1.2*  CALCIUM 8.5* 8.4* 8.1*  --   --   --   MG  --  1.4*  --   --   --   --     Recent Labs  07/22/15 1123 07/23/15 0226 08/29/15  AST 23 29 19   ALT 15 13* 16  ALKPHOS 66 46 44  BILITOT 1.1 0.8  --   PROT 6.3* 4.9*  --   ALBUMIN 2.9* 2.1*  --     Recent Labs  06/28/15 1555 07/22/15 1123  07/24/15 1122 07/25/15 0706 07/26/15 0655 08/29/15 12/04/15 01/11/16 01/17/16  WBC 13.9* 27.8*  < > 18.1* 17.5* 13.4* 9.0 17.5 14.2 9.1  NEUTROABS 12.5* 24.0*  --   --   --   --   --  13  --   --   HGB 12.4 13.0  < > 10.0* 10.5* 10.4* 9.1* 9.8*  --  8.9*  HCT 38.2 39.7  < > 31.0* 32.6* 32.0* 30* 34*  --  30*  MCV 87.5 88.6  < > 88.8 89.6 87.9  --   --   --   --   PLT 173.0 116*  < > 204 128* 131* 203 257 210 164  < > = values in this interval not displayed. Lab Results  Component Value Date   TSH 0.62 01/11/2016   Lab Results  Component Value Date   HGBA1C 6.4 01/11/2016   Lab Results  Component Value Date   CHOL 109 01/11/2016   HDL 37 01/11/2016   LDLCALC 39 01/11/2016   LDLDIRECT 60.7 11/13/2010   TRIG 167 (A) 01/11/2016   CHOLHDL 2.9 12/02/2011     Assessment/Plan Vaginal bleeding  Scant amount of blood reported doing her peri-care but unclear whether blood was from her hemorroids. Exam finding negative for abdominal tenderness, bleeding, discharge or odor.Will monitor for now possible vaginal atrophy will consider Premarin vaginal if persistent. CBC  02/11/2016.   Hemorrhoids: No hematoma or bleeding noted. Hydrocortisone 1 % rectal apply to hemorrhoids twice daily x 5 days.     Family/ staff Communication: Reviewed plan of care with patient and facility Nurse and  Nurse supervisor.  Labs/tests ordered:  CBC  02/11/2016.

## 2016-02-11 DIAGNOSIS — D508 Other iron deficiency anemias: Secondary | ICD-10-CM | POA: Diagnosis not present

## 2016-02-11 LAB — CBC AND DIFFERENTIAL
HEMATOCRIT: 30 % — AB (ref 36–46)
Hemoglobin: 9.4 g/dL — AB (ref 12.0–16.0)
PLATELETS: 174 10*3/uL (ref 150–399)
WBC: 9.4 10^3/mL

## 2016-02-14 ENCOUNTER — Other Ambulatory Visit: Payer: Self-pay | Admitting: Vascular Surgery

## 2016-02-14 DIAGNOSIS — I739 Peripheral vascular disease, unspecified: Secondary | ICD-10-CM

## 2016-02-18 DIAGNOSIS — R05 Cough: Secondary | ICD-10-CM | POA: Diagnosis not present

## 2016-02-18 DIAGNOSIS — Z09 Encounter for follow-up examination after completed treatment for conditions other than malignant neoplasm: Secondary | ICD-10-CM | POA: Diagnosis not present

## 2016-02-20 ENCOUNTER — Non-Acute Institutional Stay (SKILLED_NURSING_FACILITY): Payer: Medicare Other | Admitting: Internal Medicine

## 2016-02-20 ENCOUNTER — Encounter: Payer: Self-pay | Admitting: Internal Medicine

## 2016-02-20 DIAGNOSIS — D638 Anemia in other chronic diseases classified elsewhere: Secondary | ICD-10-CM | POA: Diagnosis not present

## 2016-02-20 DIAGNOSIS — J189 Pneumonia, unspecified organism: Secondary | ICD-10-CM

## 2016-02-20 DIAGNOSIS — E038 Other specified hypothyroidism: Secondary | ICD-10-CM | POA: Diagnosis not present

## 2016-02-20 DIAGNOSIS — I739 Peripheral vascular disease, unspecified: Secondary | ICD-10-CM

## 2016-02-20 DIAGNOSIS — K219 Gastro-esophageal reflux disease without esophagitis: Secondary | ICD-10-CM | POA: Diagnosis not present

## 2016-02-20 DIAGNOSIS — J181 Lobar pneumonia, unspecified organism: Secondary | ICD-10-CM

## 2016-02-20 DIAGNOSIS — K449 Diaphragmatic hernia without obstruction or gangrene: Secondary | ICD-10-CM

## 2016-02-20 DIAGNOSIS — L84 Corns and callosities: Secondary | ICD-10-CM | POA: Diagnosis not present

## 2016-02-20 DIAGNOSIS — N183 Chronic kidney disease, stage 3 unspecified: Secondary | ICD-10-CM

## 2016-02-20 DIAGNOSIS — I2510 Atherosclerotic heart disease of native coronary artery without angina pectoris: Secondary | ICD-10-CM

## 2016-02-20 NOTE — Progress Notes (Signed)
Patient ID: [name], female   DOB: [birth year], 82 y.o.   MRN: [id number]     [facility]  PCP: [name], MD  Code Status: Full Code   Allergies  Allergen Reactions  . Biphosphate   . Codeine Swelling  . Morphine And Related Other (See Comments)    unknown  . Percocet [Oxycodone-Acetaminophen]     unknown  . Plavix [Clopidogrel Bisulfate] Other (See Comments)    RASH  . Sulfur Other (See Comments)    GI upset    Chief Complaint  Patient presents with  . Medical Management of Chronic Issues    Routine Visit    Advanced Directives 02/08/2016  Does patient have an advance directive? No  Type of Advance Directive -  Does patient want to make changes to advanced directive? -  Copy of advanced directive(s) in chart? -  Would patient like information on creating an advanced directive? -  Pre-existing out of facility DNR order (yellow form or pink MOST form) -    HPI:  82 y.o. patient is seen for routine visit. She complaints of right great toe pain in her right foot. Denies any other concern this visit. She has been at her baseline otherwise. Her daughter has requested a recheck of Hb level given her chronic generalized weakness.     Review of Systems:  Constitutional: Negative for fever and chills. HENT: Negative for headache, congestion Respiratory: Negative for cough, wheezing. Has chronic dyspnea with exertion. Breathing currently stable. Using oxygen only on need basis during daytime. Cardiovascular: Negative for chest pain, palpitations, leg swelling.  Gastrointestinal: Negative for heartburn, nausea, vomiting, abdominal pain.  Genitourinary: Negative for dysuria  Musculoskeletal: Negative for back pain, fall. Skin: Negative for itching, rash.  Psychiatric/Behavioral: Negative for depression   Past Medical History:  Diagnosis Date  . Anxiety   . Arthritis   . Chronic diastolic CHF (congestive heart failure) ([place])    a. 10/2014 Echo:  EF 60-65%, mild LVH, grade 1 DD, dynamic obstruction @ rest - peak velocity of 271 cm/sec, peak grad of 17mmHg, PASP 10mmHg.  Marland Kitchen Coronary artery disease    a. 2012 s/p NSTEMI/Cath: 95% apical LAD dzs, otw nonobs dzs-->Med Rx;  b. 05/2011 MV: no ischemia, EF 79%, inf infarct- attenuation.  . CVA (cerebral vascular accident) ([place])   . Depression   . DVT of lower extremity (deep venous thrombosis) ([place])   . Dyspnea   . Elevated troponin    a. 10/2014 in setting of sepsis/?HCAP.  Marland Kitchen Enteritis due to Clostridium difficile 01/04/2013  . Essential hypertension   . Fall   . Fatigue   . GERD (gastroesophageal reflux disease)   . GI bleed 2005  . HCAP (healthcare-associated pneumonia) 01/22/2013  . Hemorrhoids   . Hiatal hernia   . Hyperlipidemia   . Hypothyroidism   . Malnutrition ([place])   . Obstructive hypertrophic cardiomyopathy ([place])    a. 10/2014 Echo: EF 60-65%, mild LVH, grade 1 DD, dynamic obstruction @ rest - peak velocity of 271 cm/sec, peak grad of 71mmHg, PASP 38mmHg.  . Osteoporosis   . PUD (peptic ulcer disease)   . RA (rheumatoid arthritis) ([place])   . Recurrent colitis due to Clostridium difficile 06/02/2012  . Renal insufficiency   . Right ear pain   . Sepsis ([place]) 12/19/2012  . TIA (transient ischemic attack)   . Weight loss    Past Surgical History:  Procedure Laterality Date  . ANKLE SURGERY    .  BACK SURGERY    . [place]   x3  . CARDIAC CATHETERIZATION     SHOWED RUPTURE PLAQUE IN THE LAD. THE LAD IS NONOBSTRUCTIVE WITH ONLY 30-40% STENOSIS  . COLONOSCOPY    . KNEE SURGERY    . NSTEMI  06/2010  . SPINAL FUSION SURGERY       Medications:   Medication List       Accurate as of 02/20/16  2:16 PM. Always use your most recent med list.          ACT DRY MOUTH Lozg Use as directed in the mouth or throat as needed (for dry mouth).   aspirin 81 MG chewable tablet Chew 81 mg by mouth daily.   BIOFREEZE 4 % Gel Generic drug:  Menthol (Topical  Analgesic) Apply 1 application topically every 8 (eight) hours.   calcium-vitamin D 500-200 MG-UNIT tablet Commonly known as:  OSCAL WITH D Take 1 tablet by mouth 3 (three) times daily.   CERTAVITE/ANTIOXIDANTS PO Take 1 tablet by mouth daily.   cetirizine 10 MG tablet Commonly known as:  ZYRTEC Take 10 mg by mouth daily. For depression   Cranberry 200 MG Caps Take 2 capsules by mouth 2 (two) times daily. UTI Prophylaxis   denosumab 60 MG/ML Soln injection Commonly known as:  PROLIA Inject 60 mg into the skin every 6 (six) months. Administer in upper arm, thigh, or abdomen   diclofenac sodium 1 % Gel Commonly known as:  VOLTAREN Apply 2 g topically every 6 (six) hours. Apply to left shoulder   ELIQUIS 5 MG Tabs tablet Generic drug:  apixaban Take 5 mg by mouth 2 (two) times daily. For DVT   esomeprazole 40 MG capsule Commonly known as:  NEXIUM Take 40 mg by mouth 2 (two) times daily before a meal. Take one capsule by mouth twice daily for stomach   ferrous sulfate 325 (65 FE) MG tablet Take 325 mg by mouth 3 (three) times daily with meals.   Fish Oil 1200 MG Caps Take 1,200 mg by mouth daily.   hydrochlorothiazide 12.5 MG tablet Commonly known as:  HYDRODIURIL Take 1 tablet (12.5 mg total) by mouth daily.   HYDROcodone-acetaminophen 5-325 MG tablet Commonly known as:  NORCO/VICODIN Take 1 tablet by mouth every 8 (eight) hours as needed for moderate pain.   HYDROcodone-acetaminophen 5-325 MG tablet Commonly known as:  NORCO/VICODIN Take one tablet by mouth twice daily for pain. Do not exceed 4gm of Tylenol in 24 hours   ipratropium 0.03 % nasal spray Commonly known as:  ATROVENT Place 2 sprays into both nostrils daily as needed (For asthma).   ipratropium-albuterol 0.5-2.5 (3) MG/3ML Soln Commonly known as:  DUONEB Take 3 mLs by nebulization every 8 (eight) hours as needed. For shortness of breath   levothyroxine 50 MCG tablet Commonly known as:  SYNTHROID,  LEVOTHROID Take 50 mcg by mouth daily.   lidocaine 5 % Commonly known as:  LIDODERM Place 1 patch onto the skin daily. Remove & Discard patch within 12 hours or as directed by MD   LORazepam 0.5 MG tablet Commonly known as:  ATIVAN Take one tablet by mouth every night at bedtime for anxiety   meclizine 12.5 MG tablet Commonly known as:  ANTIVERT Take 12.5 mg by mouth 2 (two) times daily.   methocarbamol 500 MG tablet Commonly known as:  ROBAXIN Take 500 mg by mouth at bedtime. For muscle spasms/shoulder pain.   metoprolol 50 MG tablet Commonly known  as:  LOPRESSOR Take 50 mg by mouth 2 (two) times daily.   montelukast 10 MG tablet Commonly known as:  SINGULAIR Take 10 mg by mouth at bedtime.   nitroGLYCERIN 0.4 MG SL tablet Commonly known as:  NITROSTAT Place 0.4 mg under the tongue every 5 (five) minutes as needed for chest pain.   ondansetron 4 MG tablet Commonly known as:  ZOFRAN Take 4 mg by mouth every 6 (six) hours as needed for nausea or vomiting.   OXYGEN Inhale 2 L into the lungs.   PATADAY 0.2 % Soln Generic drug:  Olopatadine HCl Place 1 drop into both eyes daily.   predniSONE 10 MG tablet Commonly known as:  DELTASONE Take 10 mg by mouth daily. For RA   promethazine 25 MG tablet Commonly known as:  PHENERGAN Take 25 mg by mouth every 12 (twelve) hours as needed for nausea or vomiting.   rosuvastatin 20 MG tablet Commonly known as:  CRESTOR Take 20 mg by mouth every evening. For hyperlipidemia   sertraline 50 MG tablet Commonly known as:  ZOLOFT Take 50 mg by mouth every morning. For depression   Vitamin D 2000 units Caps Take 1 capsule (2,000 Units total) by mouth daily.        Physical Exam: Vitals:   02/20/16 1403  BP: (!) 166/88  Pulse: 98  Resp: 16  Temp: 98.8 F (37.1 C)  TempSrc: Oral  SpO2: 95%  Weight: 134 lb 6.4 oz (61 kg)  Height: 5\' 6"  (1.676 m)   Body mass index is 21.69 kg/m.  General- elderly female, well  built, in no acute distress Head- normocephalic, atraumatic Throat- moist mucus membrane Eyes- PERRLA, EOMI, no pallor, no icterus Neck- no cervical lymphadenopathy Cardiovascular- normal s1,s2, no murmur, trace leg edema, good distal pulse Respiratory- bilateral clear to auscultation, no wheeze, no rhonchi, no crackles, no use of accessory muscles, on o2 Abdomen- bowel sounds present, soft, non tender Musculoskeletal- able to move all 4 extremities, generalized weakness on wheelchair Neurological- alert and oriented to person, place and time Skin- warm and dry, easy bruising, has redness to tip of her right great toe with good capillary refill, no drainage noted, no skin breakdown, normal temperature Psychiatry- normal mood and affect    Labs reviewed: Basic Metabolic Panel:  Recent Labs  07/24/15 1122 07/25/15 0706 07/26/15 0655 08/29/15 12/04/15 01/11/16  NA 143 141 139 144 145 147  K 3.0* 3.4* 3.3* 4.5 3.9 5.0  CL 107 109 108  --   --   --   CO2 21* 21* 19*  --   --   --   GLUCOSE 132* 83 87  --   --   --   BUN 20 18 12 21  29* 20  CREATININE 1.54* 1.52* 1.38* 1.3* 1.4* 1.2*  CALCIUM 8.5* 8.4* 8.1*  --   --   --   MG  --  1.4*  --   --   --   --    Liver Function Tests:  Recent Labs  07/22/15 1123 07/23/15 0226 08/29/15  AST 23 29 19   ALT 15 13* 16  ALKPHOS 66 46 44  BILITOT 1.1 0.8  --   PROT 6.3* 4.9*  --   ALBUMIN 2.9* 2.1*  --    No results for input(s): LIPASE, AMYLASE in the last 8760 hours. No results for input(s): AMMONIA in the last 8760 hours. CBC:  Recent Labs  06/28/15 1555 07/22/15 1123  07/24/15 1122 07/25/15 [id number]  07/26/15 0655  12/04/15  01/17/16 01/24/16 02/11/16  WBC 13.9* 27.8*  < > 18.1* 17.5* 13.4*  < > 17.5  < > 9.1 19.0 9.4  NEUTROABS 12.5* 24.0*  --   --   --   --   --  13  --   --   --   --   HGB 12.4 13.0  < > 10.0* 10.5* 10.4*  < > 9.8*  --  8.9* 11.3* 9.4*  HCT 38.2 39.7  < > 31.0* 32.6* 32.0*  < > 34*  --  30* 36 30*  MCV  87.5 88.6  < > 88.8 89.6 87.9  --   --   --   --   --   --   PLT 173.0 116*  < > 204 128* 131*  < > 257  < > 164 184 174  < > = values in this interval not displayed.  Lab Results  Component Value Date   TSH 0.62 01/11/2016     Chest X-ray AP/LAT 02/18/16  Impression. Probable hiatal hernia. No definite acute infiltrate on this exam when compared to prior. May consider further evaluation with CT if deemed clinically necessary. Cardiomegaly   Assessment/Plan  ckd stage 3 Monitor bmp Lab Results  Component Value Date   CREATININE 1.2 (A) 01/11/2016    Hiatal hernia Stable symptom, continue nexium 40 mg bid and zofran prn nausea  Anemia of chronic disease With ckd. Continue feso4 325 mg tid. Monitor cbc periodically. Hb was checked 2 weeks back shwoing drop from 1.3 to 9.4. With her on eliquis and aspirin, check cbc. No melena or hematemesis at present. Change aspirin to [facility]  Leg claudication Reviewed ABI showing mild to moderate peripheral vascular disease. With her ongoing pain, get vascular consult. Continue aspirin ec 81 mg daily  Callus To wear correct fitting shoe. Get podiatry consult to assess further  Hypothyroidism Lab Results  Component Value Date   TSH 0.62 01/11/2016   Continue levothyroxine 50 mcg daily  CAD Chest pain free. Continue lopressor 50 mg bid with rosuvastatin and aspirin Lipid Panel     Component Value Date/Time   CHOL 109 01/11/2016   TRIG 167 (A) 01/11/2016   HDL 37 01/11/2016   CHOLHDL 2.9 12/02/2011 0655   VLDL 56 (H) 12/02/2011 0655   LDLCALC 39 01/11/2016   LDLDIRECT 60.7 11/13/2010 1130   Left lower lobe Pneumonia Has completed antibiotics, breathing has improved and repeat cxr shows resolution of pneumonia on review today. Monitor clinically  Lab- cbc  Family/ staff Communication: reviewed care plan with patient and nursing supervisor    [name], MD Internal Medicine [place], [place] [address] Cell Phone (Monday-Friday 8 am - 5 pm): [phone] On Call: [phone] and follow prompts after 5 pm and on weekends Office Phone: [phone] Office Fax: [phone]

## 2016-02-21 DIAGNOSIS — D508 Other iron deficiency anemias: Secondary | ICD-10-CM | POA: Diagnosis not present

## 2016-02-21 LAB — CBC AND DIFFERENTIAL
HEMATOCRIT: 29 % — AB (ref 36–46)
HEMOGLOBIN: 8.9 g/dL — AB (ref 12.0–16.0)
PLATELETS: 183 10*3/uL (ref 150–399)
WBC: 10.7 10*3/mL

## 2016-02-26 ENCOUNTER — Encounter: Payer: Self-pay | Admitting: Interventional Cardiology

## 2016-02-26 ENCOUNTER — Ambulatory Visit (INDEPENDENT_AMBULATORY_CARE_PROVIDER_SITE_OTHER): Payer: Medicare Other | Admitting: Interventional Cardiology

## 2016-02-26 VITALS — BP 140/74 | HR 80

## 2016-02-26 DIAGNOSIS — I5032 Chronic diastolic (congestive) heart failure: Secondary | ICD-10-CM | POA: Diagnosis not present

## 2016-02-26 DIAGNOSIS — I2510 Atherosclerotic heart disease of native coronary artery without angina pectoris: Secondary | ICD-10-CM

## 2016-02-26 DIAGNOSIS — I130 Hypertensive heart and chronic kidney disease with heart failure and stage 1 through stage 4 chronic kidney disease, or unspecified chronic kidney disease: Secondary | ICD-10-CM

## 2016-02-26 DIAGNOSIS — I5030 Unspecified diastolic (congestive) heart failure: Secondary | ICD-10-CM

## 2016-02-26 DIAGNOSIS — I272 Other secondary pulmonary hypertension: Secondary | ICD-10-CM

## 2016-02-26 DIAGNOSIS — I739 Peripheral vascular disease, unspecified: Secondary | ICD-10-CM

## 2016-02-26 DIAGNOSIS — I421 Obstructive hypertrophic cardiomyopathy: Secondary | ICD-10-CM

## 2016-02-26 DIAGNOSIS — N183 Chronic kidney disease, stage 3 unspecified: Secondary | ICD-10-CM

## 2016-02-26 NOTE — Patient Instructions (Signed)
Medication Instructions:  Your physician recommends that you continue on your current medications as directed. Please refer to the Current Medication list given to you today.   Labwork: None   Testing/Procedures: None   Follow-Up: Your physician wants you to follow-up in: 1 year with Dr Smith. (August 2018)  You will receive a reminder letter in the mail two months in advance. If you don't receive a letter, please call our office to schedule the follow-up appointment.        If you need a refill on your cardiac medications before your next appointment, please call your pharmacy.   

## 2016-02-26 NOTE — Progress Notes (Signed)
Cardiology Office Note    Date:  02/26/2016   ID:  [name], DOB [birth year], MRN [id number]  PCP:  [name], MD  Cardiologist: Sinclair Grooms, MD   Chief Complaint  Patient presents with  . Congestive Heart Failure    Hypertrophic CM    History of Present Illness:  [name] is a 82 y.o. female presents for hypertrophic cardiomyopathy, chronic diastolic heart failure, CAD, hyperlipidemia, and essential hypertension  She is doing well. She denies dyspnea and chest pain. She has occasional shoulder pain and back pain. She is concerned this may represent cardiac pain. Otherwise no concerns.   Past Medical History:  Diagnosis Date  . Anxiety   . Arthritis   . Chronic diastolic CHF (congestive heart failure) ([place])    a. 10/2014 Echo: EF 60-65%, mild LVH, grade 1 DD, dynamic obstruction @ rest - peak velocity of 271 cm/sec, peak grad of 20mmHg, PASP 54mmHg.  Marland Kitchen Coronary artery disease    a. 2012 s/p NSTEMI/Cath: 95% apical LAD dzs, otw nonobs dzs-->Med Rx;  b. 05/2011 MV: no ischemia, EF 79%, inf infarct- attenuation.  . CVA (cerebral vascular accident) ([place])   . Depression   . DVT of lower extremity (deep venous thrombosis) ([place])   . Dyspnea   . Elevated troponin    a. 10/2014 in setting of sepsis/?HCAP.  Marland Kitchen Enteritis due to Clostridium difficile 01/04/2013  . Essential hypertension   . Fall   . Fatigue   . GERD (gastroesophageal reflux disease)   . GI bleed 2005  . HCAP (healthcare-associated pneumonia) 01/22/2013  . Hemorrhoids   . Hiatal hernia   . Hyperlipidemia   . Hypothyroidism   . Malnutrition ([place])   . Obstructive hypertrophic cardiomyopathy ([place])    a. 10/2014 Echo: EF 60-65%, mild LVH, grade 1 DD, dynamic obstruction @ rest - peak velocity of 271 cm/sec, peak grad of 77mmHg, PASP 81mmHg.  . Osteoporosis   . PUD (peptic ulcer disease)   . RA (rheumatoid arthritis) ([place])   . Recurrent colitis due to Clostridium difficile 06/02/2012  . Renal  insufficiency   . Right ear pain   . Sepsis ([place]) 12/19/2012  . TIA (transient ischemic attack)   . Weight loss     Past Surgical History:  Procedure Laterality Date  . ANKLE SURGERY    . BACK SURGERY    . [place]   x3  . CARDIAC CATHETERIZATION     SHOWED RUPTURE PLAQUE IN THE LAD. THE LAD IS NONOBSTRUCTIVE WITH ONLY 30-40% STENOSIS  . COLONOSCOPY    . KNEE SURGERY    . NSTEMI  06/2010  . SPINAL FUSION SURGERY      Current Medications: Outpatient Medications Prior to Visit  Medication Sig Dispense Refill  . apixaban (ELIQUIS) 5 MG TABS tablet Take 5 mg by mouth 2 (two) times daily. For DVT    . Artificial Saliva (ACT DRY MOUTH) LOZG Use as directed in the mouth or throat as needed (for dry mouth).    Marland Kitchen aspirin 81 MG chewable tablet Chew 81 mg by mouth daily.    . calcium-vitamin D (OSCAL WITH D) 500-200 MG-UNIT per tablet Take 1 tablet by mouth 3 (three) times daily.    . cetirizine (ZYRTEC) 10 MG tablet Take 10 mg by mouth daily. For depression    . Cholecalciferol (VITAMIN D) 2000 UNITS CAPS Take 1 capsule (2,000 Units total) by mouth daily. 30 capsule 11  . Cranberry 200 MG  CAPS Take 2 capsules by mouth 2 (two) times daily. UTI Prophylaxis    . denosumab (PROLIA) 60 MG/ML SOLN injection Inject 60 mg into the skin every 6 (six) months. Administer in upper arm, thigh, or abdomen    . diclofenac sodium (VOLTAREN) 1 % GEL Apply 2 g topically every 6 (six) hours. Apply to left shoulder    . esomeprazole (NEXIUM) 40 MG capsule Take 40 mg by mouth 2 (two) times daily before a meal. Take one capsule by mouth twice daily for stomach    . ferrous sulfate 325 (65 FE) MG tablet Take 325 mg by mouth 3 (three) times daily with meals.    . hydrochlorothiazide (HYDRODIURIL) 12.5 MG tablet Take 1 tablet (12.5 mg total) by mouth daily. 90 tablet 3  . HYDROcodone-acetaminophen (NORCO/VICODIN) 5-325 MG tablet Take one tablet by mouth twice daily for pain. Do not exceed 4gm of Tylenol  in 24 hours 60 tablet 0  . HYDROcodone-acetaminophen (NORCO/VICODIN) 5-325 MG tablet Take 1 tablet by mouth every 8 (eight) hours as needed for moderate pain.    Marland Kitchen ipratropium (ATROVENT) 0.03 % nasal spray Place 2 sprays into both nostrils daily as needed (For asthma).     . ipratropium-albuterol (DUONEB) 0.5-2.5 (3) MG/3ML SOLN Take 3 mLs by nebulization every 8 (eight) hours as needed. For shortness of breath    . levothyroxine (SYNTHROID, LEVOTHROID) 50 MCG tablet Take 50 mcg by mouth daily.     Marland Kitchen lidocaine (LIDODERM) 5 % Place 1 patch onto the skin daily. Remove & Discard patch within 12 hours or as directed by MD 30 patch 0  . LORazepam (ATIVAN) 0.5 MG tablet Take one tablet by mouth every night at bedtime for anxiety 30 tablet 5  . meclizine (ANTIVERT) 12.5 MG tablet Take 12.5 mg by mouth 2 (two) times daily.    . Menthol, Topical Analgesic, (BIOFREEZE) 4 % GEL Apply 1 application topically every 8 (eight) hours.    . methocarbamol (ROBAXIN) 500 MG tablet Take 500 mg by mouth at bedtime. For muscle spasms/shoulder pain.    . metoprolol (LOPRESSOR) 50 MG tablet Take 50 mg by mouth 2 (two) times daily.    . montelukast (SINGULAIR) 10 MG tablet Take 10 mg by mouth at bedtime.     . Multiple Vitamins-Minerals (CERTAVITE/ANTIOXIDANTS PO) Take 1 tablet by mouth daily.     . nitroGLYCERIN (NITROSTAT) 0.4 MG SL tablet Place 0.4 mg under the tongue every 5 (five) minutes as needed for chest pain.    . Omega-3 Fatty Acids (FISH OIL) 1200 MG CAPS Take 1,200 mg by mouth daily.    . ondansetron (ZOFRAN) 4 MG tablet Take 4 mg by mouth every 6 (six) hours as needed for nausea or vomiting.    . OXYGEN Inhale 2 L into the lungs.    Marland Kitchen PATADAY 0.2 % SOLN Place 1 drop into both eyes daily.     . predniSONE (DELTASONE) 10 MG tablet Take 10 mg by mouth daily. For RA    . promethazine (PHENERGAN) 25 MG tablet Take 25 mg by mouth every 12 (twelve) hours as needed for nausea or vomiting.    . rosuvastatin (CRESTOR)  20 MG tablet Take 20 mg by mouth every evening. For hyperlipidemia    . sertraline (ZOLOFT) 50 MG tablet Take 50 mg by mouth every morning. For depression     No facility-administered medications prior to visit.      Allergies:   Biphosphate; Codeine; Morphine and related; Percocet [  oxycodone-acetaminophen]; Plavix [clopidogrel bisulfate]; and Sulfur   Social History   Social History  . Marital status: Widowed    Spouse name: N/A  . Number of children: 3  . Years of education: N/A   Social History Main Topics  . Smoking status: Never Smoker  . Smokeless tobacco: Never Used  . Alcohol use No  . Drug use: No  . Sexual activity: No   Other Topics Concern  . None   Social History Narrative   Patient used to work at [employer] for 25 years until she retired   She lives by herself in [place] until she had a stroke earlier in 2013 June   Her next of kin is her daughter   She does get physical therapy was everyday at [facility]      Caffeine use: coffee in the AM     Family History:  The patient's family history includes Kidney disease in her brother and mother; Lung cancer in her father.   ROS:   Please see the history of present illness.    Decreased memory. Weight loss. Decreased appetite.  All other systems reviewed and are negative.   PHYSICAL EXAM:   VS:  BP 140/74   Pulse 80   LMP  (LMP Unknown)    GEN: Well nourished, well developed, in no acute distress  HEENT: normal  Neck: no JVD, carotid bruits, or masses Cardiac: RRR; no murmurs, rubs, or gallops,no edema  Respiratory:  clear to auscultation bilaterally, normal work of breathing GI: soft, nontender, nondistended, + BS MS: no deformity or atrophy  Skin: warm and dry, no rash Neuro:  Alert and Oriented x 3, Strength and sensation are intact Psych: euthymic mood, full affect  Wt Readings from Last 3 Encounters:  02/20/16 134 lb 6.4 oz (61 kg)  02/08/16 138 lb (62.6 kg)  01/24/16 139 lb (63  kg)      Studies/Labs Reviewed:   EKG:  EKG  Normal sinus rhythm with overall normal appearance.  Recent Labs: 07/25/2015: Magnesium 1.4 08/29/2015: ALT 16 01/11/2016: BUN 20; Creatinine 1.2; Potassium 5.0; Sodium 147; TSH 0.62 02/11/2016: Hemoglobin 9.4; Platelets 174   Lipid Panel    Component Value Date/Time   CHOL 109 01/11/2016   TRIG 167 (A) 01/11/2016   HDL 37 01/11/2016   CHOLHDL 2.9 12/02/2011 0655   VLDL 56 (H) 12/02/2011 0655   LDLCALC 39 01/11/2016   LDLDIRECT 60.7 11/13/2010 1130    Additional studies/ records that were reviewed today include:  Echocardiogram performed 10/30/14:  Study Conclusions  - Left ventricle: The cavity size was normal. Wall thickness was   increased in a pattern of mild LVH. Systolic function was normal.   The estimated ejection fraction was in the range of 60% to 65%.   There was dynamic obstruction at rest, with a peak velocity of   271 cm/sec and a peak gradient of 29 mm Hg. There was dynamic   obstruction. Wall motion was normal; there were no regional wall   motion abnormalities. Doppler parameters are consistent with   abnormal left ventricular relaxation (grade 1 diastolic   dysfunction). - Pulmonary arteries: PA peak pressure: 47 mm Hg (S).  ASSESSMENT:    1. Obstructive hypertrophic cardiomyopathy ([place])   2. Chronic diastolic CHF (congestive heart failure) ([place])   3. Coronary artery disease involving native coronary artery of native heart without angina pectoris   4. 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 Pulmonary hypertension ([place])      PLAN:  In order of problems listed above:  1. Continue beta blocker therapy to prevent symptoms and complications related to diastolic heart failure. 2. 2 g sodium diet. 3. Notify if prolonged chest discomfort or increasing symptoms. 4. Continue current medical regimen including beta blocker.    Medication  Adjustments/Labs and Tests Ordered: Current medicines are reviewed at length with the patient today.  Concerns regarding medicines are outlined above.  Medication changes, Labs and Tests ordered today are listed in the Patient Instructions below. There are no Patient Instructions on file for this visit.   Signed, Sinclair Grooms, MD  02/26/2016 2:12 PM    [facility] [address], [place], [place]  [address] Phone: [phone]; Fax: [phone]

## 2016-03-04 ENCOUNTER — Other Ambulatory Visit: Payer: Self-pay | Admitting: *Deleted

## 2016-03-04 DIAGNOSIS — E039 Hypothyroidism, unspecified: Secondary | ICD-10-CM | POA: Diagnosis not present

## 2016-03-04 DIAGNOSIS — E559 Vitamin D deficiency, unspecified: Secondary | ICD-10-CM | POA: Diagnosis not present

## 2016-03-04 LAB — TSH: TSH: 0.53 u[IU]/mL (ref 0.41–5.90)

## 2016-03-04 MED ORDER — HYDROCODONE-ACETAMINOPHEN 5-325 MG PO TABS: ORAL_TABLET | ORAL | 0 refills | Status: DC

## 2016-03-04 NOTE — Telephone Encounter (Signed)
[facility] [phone] Fax: [phone]  

## 2016-03-10 DIAGNOSIS — N390 Urinary tract infection, site not specified: Secondary | ICD-10-CM | POA: Diagnosis not present

## 2016-03-13 ENCOUNTER — Encounter: Payer: Self-pay | Admitting: Vascular Surgery

## 2016-03-19 ENCOUNTER — Encounter: Payer: Medicare Other | Admitting: Vascular Surgery

## 2016-03-19 ENCOUNTER — Ambulatory Visit (HOSPITAL_COMMUNITY): Payer: Medicare Other | Attending: Vascular Surgery

## 2016-03-19 ENCOUNTER — Encounter (HOSPITAL_COMMUNITY): Payer: Medicare Other

## 2016-03-24 ENCOUNTER — Encounter: Payer: Self-pay | Admitting: Family

## 2016-03-24 ENCOUNTER — Non-Acute Institutional Stay (SKILLED_NURSING_FACILITY): Payer: Medicare Other | Admitting: Family

## 2016-03-24 ENCOUNTER — Other Ambulatory Visit: Payer: Self-pay | Admitting: *Deleted

## 2016-03-24 DIAGNOSIS — E039 Hypothyroidism, unspecified: Secondary | ICD-10-CM

## 2016-03-24 DIAGNOSIS — I5030 Unspecified diastolic (congestive) heart failure: Secondary | ICD-10-CM | POA: Diagnosis not present

## 2016-03-24 DIAGNOSIS — K449 Diaphragmatic hernia without obstruction or gangrene: Secondary | ICD-10-CM | POA: Diagnosis not present

## 2016-03-24 DIAGNOSIS — I130 Hypertensive heart and chronic kidney disease with heart failure and stage 1 through stage 4 chronic kidney disease, or unspecified chronic kidney disease: Secondary | ICD-10-CM | POA: Diagnosis not present

## 2016-03-24 DIAGNOSIS — I5032 Chronic diastolic (congestive) heart failure: Secondary | ICD-10-CM

## 2016-03-24 DIAGNOSIS — E785 Hyperlipidemia, unspecified: Secondary | ICD-10-CM | POA: Diagnosis not present

## 2016-03-24 DIAGNOSIS — K219 Gastro-esophageal reflux disease without esophagitis: Secondary | ICD-10-CM

## 2016-03-24 DIAGNOSIS — K649 Unspecified hemorrhoids: Secondary | ICD-10-CM

## 2016-03-24 DIAGNOSIS — N183 Chronic kidney disease, stage 3 unspecified: Secondary | ICD-10-CM

## 2016-03-24 MED ORDER — LORAZEPAM 0.5 MG PO TABS: ORAL_TABLET | ORAL | 3 refills | Status: DC

## 2016-03-24 NOTE — Progress Notes (Signed)
Location:  [facility] Room Number: [phone] Place of Service:  SNF (31) Provider:  Marlowe Sax, FNP-C  [name], MD  Patient Care Team: [name], MD as PCP - General (Internal Medicine) [name], NP as Nurse Practitioner (Nurse Practitioner)  Extended Emergency Contact Information Primary Emergency Contact: Laforest,[name] Address: [id number], [place] Phone: [phone] Mobile Phone: [phone] Relation: Son Secondary Emergency Contact: [name] [place] Phone: [phone] Mobile Phone: [phone] Relation: Daughter  Code Status:  Full Code Goals of care: Advanced Directive information Advanced Directives 03/24/2016  Does patient have an advance directive? No  Type of Advance Directive -  Does patient want to make changes to advanced directive? -  Copy of advanced directive(s) in chart? -  Would patient like information on creating an advanced directive? -  Pre-existing out of facility DNR order (yellow form or pink MOST form) -     Chief Complaint  Patient presents with  . Medical Management of Chronic Issues    routine    HPI:  Pt is a 82 y.o. female seen today at [facility] for medical management of chronic diseases. She has a medical history of HTN, CHF, CAD, Hypothyroidism, RA, OA  Among other conditions. She is seen in her room today. She complains of hemorrhoids pain. She denies any constipation LBM 03/24/2016. She has noticed bright red blood on tissue paper after cleaning self. She reports no recent fall episodes or hospital admission. Facility staff reports no new concerns.      Past Medical History:  Diagnosis Date  . Anxiety   . Arthritis   . Chronic diastolic CHF (congestive heart failure) ([place])    a. 10/2014 Echo: EF 60-65%, mild LVH, grade 1 DD, dynamic obstruction @ rest - peak velocity of 271 cm/sec, peak grad of  48mmHg, PASP 3mmHg.  Marland Kitchen Coronary artery disease    a. 2012 s/p NSTEMI/Cath: 95% apical LAD dzs, otw nonobs dzs-->Med Rx;  b. 05/2011 MV: no ischemia, EF 79%, inf infarct- attenuation.  . CVA (cerebral vascular accident) ([place])   . Depression   . DVT of lower extremity (deep venous thrombosis) ([place])   . Dyspnea   . Elevated troponin    a. 10/2014 in setting of sepsis/?HCAP.  Marland Kitchen Enteritis due to Clostridium difficile 01/04/2013  . Essential hypertension   . Fall   . Fatigue   . GERD (gastroesophageal reflux disease)   . GI bleed 2005  . HCAP (healthcare-associated pneumonia) 01/22/2013  . Hemorrhoids   . Hiatal hernia   . Hyperlipidemia   . Hypothyroidism   . Malnutrition ([place])   . Obstructive hypertrophic cardiomyopathy ([place])    a. 10/2014 Echo: EF 60-65%, mild LVH, grade 1 DD, dynamic obstruction @ rest - peak velocity of 271 cm/sec, peak grad of 39mmHg, PASP 7mmHg.  . Osteoporosis   . PUD (peptic ulcer disease)   . RA (rheumatoid arthritis) ([place])   . Recurrent colitis due to Clostridium difficile 06/02/2012  . Renal insufficiency   . Right ear pain   . Sepsis ([place]) 12/19/2012  . TIA (transient ischemic attack)   . Weight loss    Past Surgical History:  Procedure Laterality Date  . ANKLE SURGERY    . BACK SURGERY    . [place]   x3  . CARDIAC CATHETERIZATION  SHOWED RUPTURE PLAQUE IN THE LAD. THE LAD IS NONOBSTRUCTIVE WITH ONLY 30-40% STENOSIS  . COLONOSCOPY    . KNEE SURGERY    . NSTEMI  06/2010  . SPINAL FUSION SURGERY      Allergies  Allergen Reactions  . Biphosphate   . Codeine Swelling  . Morphine And Related Other (See Comments)    unknown  . Percocet [Oxycodone-Acetaminophen]     unknown  . Plavix [Clopidogrel Bisulfate] Other (See Comments)    RASH  . Sulfur Other (See Comments)    GI upset      Medication List       Accurate as of 03/24/16  2:33 PM. Always use your most recent med list.          ACT DRY MOUTH Lozg Use as directed in  the mouth or throat as needed (for dry mouth).   aspirin 81 MG chewable tablet Chew 81 mg by mouth daily.   BIOFREEZE 4 % Gel Generic drug:  Menthol (Topical Analgesic) Apply 1 application topically every 8 (eight) hours.   calcium-vitamin D 500-200 MG-UNIT tablet Commonly known as:  OSCAL WITH D Take 1 tablet by mouth 3 (three) times daily.   CERTAVITE/ANTIOXIDANTS PO Take 1 tablet by mouth daily.   cetirizine 10 MG tablet Commonly known as:  ZYRTEC Take 10 mg by mouth daily. For depression   Cranberry 200 MG Caps Take 2 capsules by mouth 2 (two) times daily. UTI Prophylaxis   denosumab 60 MG/ML Soln injection Commonly known as:  PROLIA Inject 60 mg into the skin every 6 (six) months. Administer in upper arm, thigh, or abdomen   diclofenac sodium 1 % Gel Commonly known as:  VOLTAREN Apply 2 g topically every 6 (six) hours. Apply to left shoulder   ELIQUIS 5 MG Tabs tablet Generic drug:  apixaban Take 5 mg by mouth 2 (two) times daily. For DVT   esomeprazole 40 MG capsule Commonly known as:  NEXIUM Take 40 mg by mouth 2 (two) times daily before a meal. Take one capsule by mouth twice daily for stomach   ferrous sulfate 325 (65 FE) MG tablet Take 325 mg by mouth 3 (three) times daily with meals.   Fish Oil 1200 MG Caps Take 1,200 mg by mouth daily.   hydrochlorothiazide 12.5 MG tablet Commonly known as:  HYDRODIURIL Take 1 tablet (12.5 mg total) by mouth daily.   HYDROcodone-acetaminophen 5-325 MG tablet Commonly known as:  NORCO/VICODIN Take 1 tablet by mouth every 8 (eight) hours as needed for moderate pain.   HYDROcodone-acetaminophen 5-325 MG tablet Commonly known as:  NORCO/VICODIN Take one tablet by mouth twice daily for pain. Do not exceed 4gm of Tylenol in 24 hours   ipratropium 0.03 % nasal spray Commonly known as:  ATROVENT Place 2 sprays into both nostrils daily as needed (For asthma).   ipratropium-albuterol 0.5-2.5 (3) MG/3ML Soln Commonly  known as:  DUONEB Take 3 mLs by nebulization every 8 (eight) hours as needed. For shortness of breath   levothyroxine 50 MCG tablet Commonly known as:  SYNTHROID, LEVOTHROID Take 50 mcg by mouth daily.   lidocaine 5 % Commonly known as:  LIDODERM Place 1 patch onto the skin daily. Remove & Discard patch within 12 hours or as directed by MD   LORazepam 0.5 MG tablet Commonly known as:  ATIVAN Take one tablet by mouth every night at bedtime for anxiety   meclizine 12.5 MG tablet Commonly known as:  ANTIVERT Take 12.5 mg  by mouth 2 (two) times daily.   methocarbamol 500 MG tablet Commonly known as:  ROBAXIN Take 500 mg by mouth at bedtime. For muscle spasms/shoulder pain.   metoprolol 50 MG tablet Commonly known as:  LOPRESSOR Take 50 mg by mouth 2 (two) times daily.   montelukast 10 MG tablet Commonly known as:  SINGULAIR Take 10 mg by mouth at bedtime.   nitroGLYCERIN 0.4 MG SL tablet Commonly known as:  NITROSTAT Place 0.4 mg under the tongue every 5 (five) minutes as needed for chest pain.   ondansetron 4 MG tablet Commonly known as:  ZOFRAN Take 4 mg by mouth every 6 (six) hours as needed for nausea or vomiting.   OXYGEN Inhale 2 L into the lungs.   PATADAY 0.2 % Soln Generic drug:  Olopatadine HCl Place 1 drop into both eyes daily.   predniSONE 10 MG tablet Commonly known as:  DELTASONE Take 10 mg by mouth daily. For RA   promethazine 25 MG tablet Commonly known as:  PHENERGAN Take 25 mg by mouth every 12 (twelve) hours as needed for nausea or vomiting.   rosuvastatin 20 MG tablet Commonly known as:  CRESTOR Take 20 mg by mouth every evening. For hyperlipidemia   sertraline 50 MG tablet Commonly known as:  ZOLOFT Take 50 mg by mouth every morning. For depression   Vitamin D 2000 units Caps Take 1 capsule (2,000 Units total) by mouth daily.       Review of Systems  Constitutional: Negative for activity change, appetite change, chills, fatigue and  fever.  HENT: Negative for congestion, rhinorrhea, sinus pressure, sneezing and sore throat.   Eyes: Negative.   Respiratory: Negative for cough, chest tightness, shortness of breath and wheezing.   Cardiovascular: Negative.   Gastrointestinal: Negative for abdominal distention, abdominal pain, constipation, diarrhea, nausea and vomiting.  Endocrine: Negative.   Genitourinary: Negative for dysuria, flank pain, frequency, urgency, vaginal discharge and vaginal pain.  Musculoskeletal: Positive for gait problem.  Skin: Negative.   Neurological: Negative for dizziness, seizures, syncope, numbness and headaches.  Psychiatric/Behavioral: Negative for agitation, confusion, hallucinations and sleep disturbance. The patient is not nervous/anxious.     Immunization History  Administered Date(s) Administered  . Influenza-Unspecified 05/09/2013, 04/27/2014, 04/17/2015, 09/17/2015  . PPD Test 12/22/2012, 03/14/2014, 03/08/2015  . Pneumococcal-Unspecified 05/09/2013, 09/09/2013   Pertinent  Health Maintenance Due  Topic Date Due  . INFLUENZA VACCINE  06/30/2017 (Originally 01/29/2016)  . DEXA SCAN  Completed  . PNA vac Low Risk Adult  Addressed   Fall Risk  10/04/2014  Falls in the past year? Yes  Number falls in past yr: 2 or more  Injury with Fall? No  Risk Factor Category  High Fall Risk  Risk for fall due to : Impaired balance/gait;Impaired mobility;Medication side effect;History of fall(s)  Follow up Education provided;Falls evaluation completed;Falls prevention discussed      Vitals:   03/24/16 1418  BP: (!) 156/91  Pulse: 83  Resp: 16  Temp: 97.7 F (36.5 C)  Weight: 139 lb (63 kg)  Height: 5\' 6"  (1.676 m)   Body mass index is 22.44 kg/m. Physical Exam  Constitutional: She is oriented to person, place, and time. She appears well-developed and well-nourished. No distress.  Elderly  HENT:  Head: Normocephalic and atraumatic.  Mouth/Throat: Oropharynx is clear and moist.    Eyes: Conjunctivae and EOM are normal. Pupils are equal, round, and reactive to light. Right eye exhibits no discharge. Left eye exhibits no discharge. No scleral  icterus.  Neck: Normal range of motion. No JVD present. No thyromegaly present.  Cardiovascular: Normal rate, regular rhythm, normal heart sounds and intact distal pulses.  Exam reveals no gallop and no friction rub.   No murmur heard. Pulmonary/Chest: Effort normal. No respiratory distress. She has no wheezes. She has no rales.  Abdominal: Soft. Bowel sounds are normal. She exhibits no distension. There is no tenderness. There is no rebound and no guarding.  Genitourinary: Vagina normal. No vaginal discharge found.  Genitourinary Comments: Continent. External Hemorrhoids without any hematoma or bleeding.    Musculoskeletal: She exhibits no edema, tenderness or deformity.  Bilateral Lower extremities weakness   Lymphadenopathy:    She has no cervical adenopathy.  Neurological: She is oriented to person, place, and time.  Skin: Skin is warm and dry. No rash noted. No erythema.  Psychiatric: She has a normal mood and affect.    Labs reviewed:  Recent Labs  07/24/15 1122 07/25/15 0706 07/26/15 0655 08/29/15 12/04/15 01/11/16  NA 143 141 139 144 145 147  K 3.0* 3.4* 3.3* 4.5 3.9 5.0  CL 107 109 108  --   --   --   CO2 21* 21* 19*  --   --   --   GLUCOSE 132* 83 87  --   --   --   BUN 20 18 12 21  29* 20  CREATININE 1.54* 1.52* 1.38* 1.3* 1.4* 1.2*  CALCIUM 8.5* 8.4* 8.1*  --   --   --   MG  --  1.4*  --   --   --   --     Recent Labs  07/22/15 1123 07/23/15 0226 08/29/15  AST 23 29 19   ALT 15 13* 16  ALKPHOS 66 46 44  BILITOT 1.1 0.8  --   PROT 6.3* 4.9*  --   ALBUMIN 2.9* 2.1*  --     Recent Labs  06/28/15 1555 07/22/15 1123  07/24/15 1122 07/25/15 0706 07/26/15 0655  12/04/15  01/17/16 01/24/16 02/11/16  WBC 13.9* 27.8*  < > 18.1* 17.5* 13.4*  < > 17.5  < > 9.1 19.0 9.4  NEUTROABS 12.5* 24.0*  --   --    --   --   --  13  --   --   --   --   HGB 12.4 13.0  < > 10.0* 10.5* 10.4*  < > 9.8*  --  8.9* 11.3* 9.4*  HCT 38.2 39.7  < > 31.0* 32.6* 32.0*  < > 34*  --  30* 36 30*  MCV 87.5 88.6  < > 88.8 89.6 87.9  --   --   --   --   --   --   PLT 173.0 116*  < > 204 128* 131*  < > 257  < > 164 184 174  < > = values in this interval not displayed. Lab Results  Component Value Date   TSH 0.62 01/11/2016   Lab Results  Component Value Date   HGBA1C 6.4 01/11/2016   Lab Results  Component Value Date   CHOL 109 01/11/2016   HDL 37 01/11/2016   LDLCALC 39 01/11/2016   LDLDIRECT 60.7 11/13/2010   TRIG 167 (A) 01/11/2016   CHOLHDL 2.9 12/02/2011   Assessment/Plan HTN Continue on Metoprolol and hydrochlorothiazide.Monitor BMP   CHF Stable. Continue on Metoprolol. Monitor Weight.   Hemorrhoids  Start Hydrocortisone 1 % Rectal cream apply twice daily X 7 days. Continue to monitor.  Hernia/GERD Stable. Continue on Nexium.   Hypothyroidism Continue on Levothyroxine. Monitor TSH level.   Hyperlipidemia  Continue on Crestor. Monitor Lipid panel every 3 to 6 months.    Family/ staff Communication: Reviewed plan of care with patient and facility Nurse supervisor.  Labs/tests ordered: none

## 2016-03-24 NOTE — Telephone Encounter (Signed)
[facility] [phone] Fax: [phone]  

## 2016-03-27 DIAGNOSIS — M79675 Pain in left toe(s): Secondary | ICD-10-CM | POA: Diagnosis not present

## 2016-03-27 DIAGNOSIS — B351 Tinea unguium: Secondary | ICD-10-CM | POA: Diagnosis not present

## 2016-03-27 DIAGNOSIS — M79674 Pain in right toe(s): Secondary | ICD-10-CM | POA: Diagnosis not present

## 2016-04-04 DIAGNOSIS — N390 Urinary tract infection, site not specified: Secondary | ICD-10-CM | POA: Diagnosis not present

## 2016-04-07 ENCOUNTER — Non-Acute Institutional Stay (SKILLED_NURSING_FACILITY): Payer: Medicare Other | Admitting: Family

## 2016-04-07 DIAGNOSIS — R112 Nausea with vomiting, unspecified: Secondary | ICD-10-CM

## 2016-04-07 DIAGNOSIS — R399 Unspecified symptoms and signs involving the genitourinary system: Secondary | ICD-10-CM

## 2016-04-07 MED ORDER — PHENAZOPYRIDINE HCL 95 MG PO TABS: 95.0000 mg | ORAL_TABLET | Freq: Three times a day (TID) | ORAL | 0 refills | Status: AC

## 2016-04-07 NOTE — Progress Notes (Signed)
Location:  [facility] Room Number: [id number]  Place of Service:  SNF (31) Provider:  Hikaru Delorenzo FNP-C   [name], MD  Patient Care Team: [name], MD as PCP - General (Internal Medicine) [name], NP as Nurse Practitioner (Nurse Practitioner)  Extended Emergency Contact Information Primary Emergency Contact: Iwai,[name] Address: [id number] [place], [place] Phone: [phone] Mobile Phone: [phone] Relation: Son Secondary Emergency Contact: [name] Phone: [phone] Mobile Phone: [phone] Relation: Daughter  Code Status: Full Code  Goals of care: Advanced Directive information Advanced Directives 03/24/2016  Does patient have an advance directive? No  Type of Advance Directive -  Does patient want to make changes to advanced directive? -  Copy of advanced directive(s) in chart? -  Would patient like information on creating an advanced directive? -  Pre-existing out of facility DNR order (yellow form or pink MOST form) -     Chief Complaint  Patient presents with  . Acute Visit    HPI:  Pt is a 82 y.o. female seen today at [facility] for an acute visit for evaluation of urinary symptoms.She is seen in her room today per facility  Nurse request. She complains of burning with urination for several days. She had a urine specimen send already 10/ 11/2015 for U/A and C/S.She also states had x 1 episode of nausea previous day but none today. She has Zofran PRN for nausea. She denies any fever or chills. Preliminary urine specimen results showed Nitrite negative and Moderate Leukocytes. Awaiting Urine culture results. Patient's urine results discussed with patient's son at [phone] by MD who recommended starting on AZO for urinary burning pain while awaiting culture results.Also encourage fluid intake every shift.   Past  Medical History:  Diagnosis Date  . Anxiety   . Arthritis   . Chronic diastolic CHF (congestive heart failure) ([place])    a. 10/2014 Echo: EF 60-65%, mild LVH, grade 1 DD, dynamic obstruction @ rest - peak velocity of 271 cm/sec, peak grad of 45mmHg, PASP 62mmHg.  Marland Kitchen Coronary artery disease    a. 2012 s/p NSTEMI/Cath: 95% apical LAD dzs, otw nonobs dzs-->Med Rx;  b. 05/2011 MV: no ischemia, EF 79%, inf infarct- attenuation.  . CVA (cerebral vascular accident) ([place])   . Depression   . DVT of lower extremity (deep venous thrombosis) ([place])   . Dyspnea   . Elevated troponin    a. 10/2014 in setting of sepsis/?HCAP.  Marland Kitchen Enteritis due to Clostridium difficile 01/04/2013  . Essential hypertension   . Fall   . Fatigue   . GERD (gastroesophageal reflux disease)   . GI bleed 2005  . HCAP (healthcare-associated pneumonia) 01/22/2013  . Hemorrhoids   . Hiatal hernia   . Hyperlipidemia   . Hypothyroidism   . Malnutrition ([place])   . Obstructive hypertrophic cardiomyopathy ([place])    a. 10/2014 Echo: EF 60-65%, mild LVH, grade 1 DD, dynamic obstruction @ rest - peak velocity of 271 cm/sec, peak grad of 47mmHg, PASP 8mmHg.  . Osteoporosis   . PUD (peptic ulcer disease)   . RA (rheumatoid arthritis) ([place])   . Recurrent colitis due to Clostridium difficile 06/02/2012  . Renal insufficiency   . Right ear pain   . Sepsis ([place]) 12/19/2012  . TIA (transient ischemic attack)   . Weight loss  Past Surgical History:  Procedure Laterality Date  . ANKLE SURGERY    . BACK SURGERY    . [place]   x3  . CARDIAC CATHETERIZATION     SHOWED RUPTURE PLAQUE IN THE LAD. THE LAD IS NONOBSTRUCTIVE WITH ONLY 30-40% STENOSIS  . COLONOSCOPY    . KNEE SURGERY    . NSTEMI  06/2010  . SPINAL FUSION SURGERY      Allergies  Allergen Reactions  . Biphosphate   . Codeine Swelling  . Morphine And Related Other (See Comments)    unknown  . Percocet [Oxycodone-Acetaminophen]     unknown  . Plavix  [Clopidogrel Bisulfate] Other (See Comments)    RASH  . Sulfur Other (See Comments)    GI upset      Medication List       Accurate as of 04/07/16  3:27 PM. Always use your most recent med list.          ACT DRY MOUTH Lozg Use as directed in the mouth or throat as needed (for dry mouth).   aspirin 81 MG chewable tablet Chew 81 mg by mouth daily.   BIOFREEZE 4 % Gel Generic drug:  Menthol (Topical Analgesic) Apply 1 application topically every 8 (eight) hours.   calcium-vitamin D 500-200 MG-UNIT tablet Commonly known as:  OSCAL WITH D Take 1 tablet by mouth 3 (three) times daily.   CERTAVITE/ANTIOXIDANTS PO Take 1 tablet by mouth daily.   cetirizine 10 MG tablet Commonly known as:  ZYRTEC Take 10 mg by mouth daily. For depression   Cranberry 200 MG Caps Take 2 capsules by mouth 2 (two) times daily. UTI Prophylaxis   denosumab 60 MG/ML Soln injection Commonly known as:  PROLIA Inject 60 mg into the skin every 6 (six) months. Administer in upper arm, thigh, or abdomen   diclofenac sodium 1 % Gel Commonly known as:  VOLTAREN Apply 2 g topically every 6 (six) hours. Apply to left shoulder   ELIQUIS 5 MG Tabs tablet Generic drug:  apixaban Take 5 mg by mouth 2 (two) times daily. For DVT   esomeprazole 40 MG capsule Commonly known as:  NEXIUM Take 40 mg by mouth 2 (two) times daily before a meal. Take one capsule by mouth twice daily for stomach   ferrous sulfate 325 (65 FE) MG tablet Take 325 mg by mouth 3 (three) times daily with meals.   Fish Oil 1200 MG Caps Take 1,200 mg by mouth daily.   hydrochlorothiazide 12.5 MG tablet Commonly known as:  HYDRODIURIL Take 1 tablet (12.5 mg total) by mouth daily.   HYDROcodone-acetaminophen 5-325 MG tablet Commonly known as:  NORCO/VICODIN Take 1 tablet by mouth every 8 (eight) hours as needed for moderate pain.   HYDROcodone-acetaminophen 5-325 MG tablet Commonly known as:  NORCO/VICODIN Take one tablet by mouth  twice daily for pain. Do not exceed 4gm of Tylenol in 24 hours   ipratropium 0.03 % nasal spray Commonly known as:  ATROVENT Place 2 sprays into both nostrils daily as needed (For asthma).   ipratropium-albuterol 0.5-2.5 (3) MG/3ML Soln Commonly known as:  DUONEB Take 3 mLs by nebulization every 8 (eight) hours as needed. For shortness of breath   levothyroxine 50 MCG tablet Commonly known as:  SYNTHROID, LEVOTHROID Take 50 mcg by mouth daily.   lidocaine 5 % Commonly known as:  LIDODERM Place 1 patch onto the skin daily. Remove & Discard patch within 12 hours or as directed by MD  LORazepam 0.5 MG tablet Commonly known as:  ATIVAN Take one tablet by mouth every night at bedtime for anxiety   meclizine 12.5 MG tablet Commonly known as:  ANTIVERT Take 12.5 mg by mouth 2 (two) times daily.   methocarbamol 500 MG tablet Commonly known as:  ROBAXIN Take 500 mg by mouth at bedtime. For muscle spasms/shoulder pain.   metoprolol 50 MG tablet Commonly known as:  LOPRESSOR Take 50 mg by mouth 2 (two) times daily.   montelukast 10 MG tablet Commonly known as:  SINGULAIR Take 10 mg by mouth at bedtime.   nitroGLYCERIN 0.4 MG SL tablet Commonly known as:  NITROSTAT Place 0.4 mg under the tongue every 5 (five) minutes as needed for chest pain.   ondansetron 4 MG tablet Commonly known as:  ZOFRAN Take 4 mg by mouth every 6 (six) hours as needed for nausea or vomiting.   OXYGEN Inhale 2 L into the lungs.   PATADAY 0.2 % Soln Generic drug:  Olopatadine HCl Place 1 drop into both eyes daily.   phenazopyridine 95 MG tablet Commonly known as:  PYRIDIUM Take 1 tablet (95 mg total) by mouth 3 (three) times daily.   predniSONE 10 MG tablet Commonly known as:  DELTASONE Take 10 mg by mouth daily. For RA   promethazine 25 MG tablet Commonly known as:  PHENERGAN Take 25 mg by mouth every 12 (twelve) hours as needed for nausea or vomiting.   rosuvastatin 20 MG tablet Commonly  known as:  CRESTOR Take 20 mg by mouth every evening. For hyperlipidemia   sertraline 50 MG tablet Commonly known as:  ZOLOFT Take 50 mg by mouth every morning. For depression   Vitamin D 2000 units Caps Take 1 capsule (2,000 Units total) by mouth daily.       Review of Systems  Constitutional: Negative for activity change, appetite change, chills, fatigue and fever.  HENT: Negative for congestion, rhinorrhea, sinus pressure, sneezing and sore throat.   Eyes: Negative.   Respiratory: Negative for cough, chest tightness, shortness of breath and wheezing.   Cardiovascular: Negative.   Gastrointestinal: Negative for abdominal distention, abdominal pain, constipation, diarrhea, nausea and vomiting.  Genitourinary: Negative for difficulty urinating, dysuria, flank pain, frequency, urgency, vaginal discharge and vaginal pain.       Burning with urination   Musculoskeletal: Positive for gait problem.  Skin: Negative.   Psychiatric/Behavioral: Negative for agitation, confusion, hallucinations and sleep disturbance. The patient is not nervous/anxious.     Immunization History  Administered Date(s) Administered  . Influenza-Unspecified 05/09/2013, 04/27/2014, 04/17/2015, 09/17/2015  . PPD Test 12/22/2012, 03/14/2014, 03/08/2015  . Pneumococcal-Unspecified 05/09/2013, 09/09/2013   Pertinent  Health Maintenance Due  Topic Date Due  . INFLUENZA VACCINE  06/30/2017 (Originally 01/29/2016)  . DEXA SCAN  Completed  . PNA vac Low Risk Adult  Addressed   Fall Risk  10/04/2014  Falls in the past year? Yes  Number falls in past yr: 2 or more  Injury with Fall? No  Risk Factor Category  High Fall Risk  Risk for fall due to : Impaired balance/gait;Impaired mobility;Medication side effect;History of fall(s)  Follow up Education provided;Falls evaluation completed;Falls prevention discussed      Vitals:   04/07/16 1430  BP: 121/63  Pulse: 88  Resp: 16  Temp: 98.3 F (36.8 C)  SpO2: 92%    Weight: 134 lb 12.8 oz (61.1 kg)  Height: 5\' 6"  (1.676 m)   Body mass index is 21.76 kg/m. Physical Exam  Constitutional: She is oriented to person, place, and time. She appears well-developed and well-nourished.  Elderly in no acute distress.   HENT:  Head: Normocephalic and atraumatic.  Mouth/Throat: Oropharynx is clear and moist.  Eyes: Conjunctivae and EOM are normal. Pupils are equal, round, and reactive to light. Right eye exhibits no discharge. Left eye exhibits no discharge. No scleral icterus.  Neck: Normal range of motion. No JVD present. No thyromegaly present.  Cardiovascular: Normal rate, regular rhythm, normal heart sounds and intact distal pulses.  Exam reveals no gallop and no friction rub.   No murmur heard. Pulmonary/Chest: Effort normal. No respiratory distress. She has no wheezes. She has no rales.  Abdominal: Soft. Bowel sounds are normal. She exhibits no distension. There is no tenderness. There is no rebound and no guarding.  Genitourinary: No vaginal discharge found.  Genitourinary Comments: Continent.   Musculoskeletal: She exhibits no edema, tenderness or deformity.  Unsteady gait. Bilateral Lower extremities weakness   Lymphadenopathy:    She has no cervical adenopathy.  Neurological: She is oriented to person, place, and time.  Skin: Skin is warm and dry. No rash noted. No erythema.  Psychiatric: She has a normal mood and affect.    Labs reviewed:  Recent Labs  07/24/15 1122 07/25/15 0706 07/26/15 0655 08/29/15 12/04/15 01/11/16  NA 143 141 139 144 145 147  K 3.0* 3.4* 3.3* 4.5 3.9 5.0  CL 107 109 108  --   --   --   CO2 21* 21* 19*  --   --   --   GLUCOSE 132* 83 87  --   --   --   BUN 20 18 12 21  29* 20  CREATININE 1.54* 1.52* 1.38* 1.3* 1.4* 1.2*  CALCIUM 8.5* 8.4* 8.1*  --   --   --   MG  --  1.4*  --   --   --   --     Recent Labs  07/22/15 1123 07/23/15 0226 08/29/15  AST 23 29 19   ALT 15 13* 16  ALKPHOS 66 46 44  BILITOT 1.1  0.8  --   PROT 6.3* 4.9*  --   ALBUMIN 2.9* 2.1*  --     Recent Labs  06/28/15 1555 07/22/15 1123  07/24/15 1122 07/25/15 0706 07/26/15 0655  12/04/15  01/17/16 01/24/16 02/11/16  WBC 13.9* 27.8*  < > 18.1* 17.5* 13.4*  < > 17.5  < > 9.1 19.0 9.4  NEUTROABS 12.5* 24.0*  --   --   --   --   --  13  --   --   --   --   HGB 12.4 13.0  < > 10.0* 10.5* 10.4*  < > 9.8*  --  8.9* 11.3* 9.4*  HCT 38.2 39.7  < > 31.0* 32.6* 32.0*  < > 34*  --  30* 36 30*  MCV 87.5 88.6  < > 88.8 89.6 87.9  --   --   --   --   --   --   PLT 173.0 116*  < > 204 128* 131*  < > 257  < > 164 184 174  < > = values in this interval not displayed. Lab Results  Component Value Date   TSH 0.62 01/11/2016   Lab Results  Component Value Date   HGBA1C 6.4 01/11/2016   Lab Results  Component Value Date   CHOL 109 01/11/2016   HDL 37 01/11/2016   LDLCALC 39 01/11/2016  LDLDIRECT 60.7 11/13/2010   TRIG 167 (A) 01/11/2016   CHOLHDL 2.9 12/02/2011   Assessment/Plan Urinary symptoms  Worsening burning sensation with voiding. Preliminary urine specimen results showed Nitrite negative and Moderate Leukocytes. Awaiting Urine culture results. Patient's urine results discussed with patient's son at [phone] by MD who recommended starting on AZO for urinary burning pain while awaiting culture results.Also encourage fluid intake every shift. Start AZO 95 mg Tablet one by mouth tid X 3 days. Encourage fluid intake every shift. Obtain Vital signs Q shift x 5 days then resume previous facility orders.  Nausea Continue on Zofran 4 mg Tablet PRN    Family/ staff Communication: Reviewed plan of care with Dr.Pandey, Facility Nurse supervisor.Dr.Pandey reviewed plan with Patient's Son Shelisha Wardrop at [phone]  Labs/tests ordered: None

## 2016-04-08 ENCOUNTER — Non-Acute Institutional Stay (SKILLED_NURSING_FACILITY): Payer: Medicare Other | Admitting: Family

## 2016-04-08 ENCOUNTER — Other Ambulatory Visit: Payer: Self-pay | Admitting: *Deleted

## 2016-04-08 DIAGNOSIS — N3001 Acute cystitis with hematuria: Secondary | ICD-10-CM | POA: Diagnosis not present

## 2016-04-08 DIAGNOSIS — N390 Urinary tract infection, site not specified: Secondary | ICD-10-CM | POA: Insufficient documentation

## 2016-04-08 MED ORDER — CIPROFLOXACIN HCL 500 MG PO TABS: 500.0000 mg | ORAL_TABLET | Freq: Two times a day (BID) | ORAL | 0 refills | Status: AC

## 2016-04-08 MED ORDER — HYDROCODONE-ACETAMINOPHEN 5-325 MG PO TABS: ORAL_TABLET | ORAL | 0 refills | Status: DC

## 2016-04-08 NOTE — Telephone Encounter (Signed)
[facility] [phone] Fax: [phone]  

## 2016-04-08 NOTE — Progress Notes (Signed)
Location:  [facility] Room Number: [id number]  Place of Service:  SNF (31) Provider: Tamieka Rancourt FNP-C   [name], MD  Patient Care Team: [name], MD as PCP - General (Internal Medicine) [name], NP as Nurse Practitioner (Nurse Practitioner)  Extended Emergency Contact Information Primary Emergency Contact: Baity,[name] Address: [id number], [place] Phone: [phone] Mobile Phone: [phone] Relation: Son Secondary Emergency Contact: [name] Phone: [phone] Mobile Phone: [phone] Relation: Daughter  Code Status: Full Code  Goals of care: Advanced Directive information Advanced Directives 03/24/2016  Does patient have an advance directive? No  Type of Advance Directive -  Does patient want to make changes to advanced directive? -  Copy of advanced directive(s) in chart? -  Would patient like information on creating an advanced directive? -  Pre-existing out of facility DNR order (yellow form or pink MOST form) -     Chief Complaint  Patient presents with  . Acute Visit    abnormal lab results     HPI:  Pt is a 82 y.o. female seen today at [facility]  for an acute visit for evaluation of abnormal lab results. She is seen in her room today. She continues to complain of burning sensation with voiding. She denies any fever or chills. Her urine analysis showed moderate amounts of leukocytes, trace blood with negative ntirites. Urine culture showed > 100000 colonies of Proteus Mirabilis  Facility Nurse reports no new concerns. Discussed U/A and C/S results and treatment plan with patient's son Chloey Schiessl at [phone].   Past Medical History:  Diagnosis Date  . Anxiety   . Arthritis   . Chronic diastolic CHF (congestive heart failure) ([place])    a. 10/2014 Echo: EF 60-65%, mild LVH, grade 1 DD, dynamic  obstruction @ rest - peak velocity of 271 cm/sec, peak grad of 78mmHg, PASP 34mmHg.  Marland Kitchen Coronary artery disease    a. 2012 s/p NSTEMI/Cath: 95% apical LAD dzs, otw nonobs dzs-->Med Rx;  b. 05/2011 MV: no ischemia, EF 79%, inf infarct- attenuation.  . CVA (cerebral vascular accident) ([place])   . Depression   . DVT of lower extremity (deep venous thrombosis) ([place])   . Dyspnea   . Elevated troponin    a. 10/2014 in setting of sepsis/?HCAP.  Marland Kitchen Enteritis due to Clostridium difficile 01/04/2013  . Essential hypertension   . Fall   . Fatigue   . GERD (gastroesophageal reflux disease)   . GI bleed 2005  . HCAP (healthcare-associated pneumonia) 01/22/2013  . Hemorrhoids   . Hiatal hernia   . Hyperlipidemia   . Hypothyroidism   . Malnutrition ([place])   . Obstructive hypertrophic cardiomyopathy ([place])    a. 10/2014 Echo: EF 60-65%, mild LVH, grade 1 DD, dynamic obstruction @ rest - peak velocity of 271 cm/sec, peak grad of 26mmHg, PASP 1mmHg.  . Osteoporosis   . PUD (peptic ulcer disease)   . RA (rheumatoid arthritis) ([place])   . Recurrent colitis due to Clostridium difficile 06/02/2012  . Renal insufficiency   . Right ear pain   . Sepsis ([place]) 12/19/2012  . TIA (transient ischemic attack)   . Weight loss    Past Surgical History:  Procedure Laterality Date  . ANKLE SURGERY    . BACK SURGERY    . [place]  x3  . CARDIAC CATHETERIZATION     SHOWED RUPTURE PLAQUE IN THE LAD. THE LAD IS NONOBSTRUCTIVE WITH ONLY 30-40% STENOSIS  . COLONOSCOPY    . KNEE SURGERY    . NSTEMI  06/2010  . SPINAL FUSION SURGERY      Allergies  Allergen Reactions  . Biphosphate   . Codeine Swelling  . Morphine And Related Other (See Comments)    unknown  . Percocet [Oxycodone-Acetaminophen]     unknown  . Plavix [Clopidogrel Bisulfate] Other (See Comments)    RASH  . Sulfur Other (See Comments)    GI upset      Medication List       Accurate as of 04/08/16 12:58 PM. Always use your most  recent med list.          ACT DRY MOUTH Lozg Use as directed in the mouth or throat as needed (for dry mouth).   aspirin 81 MG chewable tablet Chew 81 mg by mouth daily.   BIOFREEZE 4 % Gel Generic drug:  Menthol (Topical Analgesic) Apply 1 application topically every 8 (eight) hours.   calcium-vitamin D 500-200 MG-UNIT tablet Commonly known as:  OSCAL WITH D Take 1 tablet by mouth 3 (three) times daily.   CERTAVITE/ANTIOXIDANTS PO Take 1 tablet by mouth daily.   cetirizine 10 MG tablet Commonly known as:  ZYRTEC Take 10 mg by mouth daily. For depression   ciprofloxacin 500 MG tablet Commonly known as:  CIPRO Take 1 tablet (500 mg total) by mouth 2 (two) times daily.   Cranberry 200 MG Caps Take 2 capsules by mouth 2 (two) times daily. UTI Prophylaxis   denosumab 60 MG/ML Soln injection Commonly known as:  PROLIA Inject 60 mg into the skin every 6 (six) months. Administer in upper arm, thigh, or abdomen   diclofenac sodium 1 % Gel Commonly known as:  VOLTAREN Apply 2 g topically every 6 (six) hours. Apply to left shoulder   ELIQUIS 5 MG Tabs tablet Generic drug:  apixaban Take 5 mg by mouth 2 (two) times daily. For DVT   esomeprazole 40 MG capsule Commonly known as:  NEXIUM Take 40 mg by mouth 2 (two) times daily before a meal. Take one capsule by mouth twice daily for stomach   ferrous sulfate 325 (65 FE) MG tablet Take 325 mg by mouth 3 (three) times daily with meals.   Fish Oil 1200 MG Caps Take 1,200 mg by mouth daily.   hydrochlorothiazide 12.5 MG tablet Commonly known as:  HYDRODIURIL Take 1 tablet (12.5 mg total) by mouth daily.   HYDROcodone-acetaminophen 5-325 MG tablet Commonly known as:  NORCO/VICODIN Take 1 tablet by mouth every 8 (eight) hours as needed for moderate pain.   HYDROcodone-acetaminophen 5-325 MG tablet Commonly known as:  NORCO/VICODIN Take one tablet by mouth twice daily for pain. Do not exceed 4gm of Tylenol in 24 hours     ipratropium 0.03 % nasal spray Commonly known as:  ATROVENT Place 2 sprays into both nostrils daily as needed (For asthma).   ipratropium-albuterol 0.5-2.5 (3) MG/3ML Soln Commonly known as:  DUONEB Take 3 mLs by nebulization every 8 (eight) hours as needed. For shortness of breath   levothyroxine 50 MCG tablet Commonly known as:  SYNTHROID, LEVOTHROID Take 50 mcg by mouth daily.   lidocaine 5 % Commonly known as:  LIDODERM Place 1 patch onto the skin daily. Remove & Discard patch within 12 hours or as directed by MD   LORazepam 0.5  MG tablet Commonly known as:  ATIVAN Take one tablet by mouth every night at bedtime for anxiety   meclizine 12.5 MG tablet Commonly known as:  ANTIVERT Take 12.5 mg by mouth 2 (two) times daily.   methocarbamol 500 MG tablet Commonly known as:  ROBAXIN Take 500 mg by mouth at bedtime. For muscle spasms/shoulder pain.   metoprolol 50 MG tablet Commonly known as:  LOPRESSOR Take 50 mg by mouth 2 (two) times daily.   montelukast 10 MG tablet Commonly known as:  SINGULAIR Take 10 mg by mouth at bedtime.   nitroGLYCERIN 0.4 MG SL tablet Commonly known as:  NITROSTAT Place 0.4 mg under the tongue every 5 (five) minutes as needed for chest pain.   ondansetron 4 MG tablet Commonly known as:  ZOFRAN Take 4 mg by mouth every 6 (six) hours as needed for nausea or vomiting.   OXYGEN Inhale 2 L into the lungs.   PATADAY 0.2 % Soln Generic drug:  Olopatadine HCl Place 1 drop into both eyes daily.   phenazopyridine 95 MG tablet Commonly known as:  PYRIDIUM Take 1 tablet (95 mg total) by mouth 3 (three) times daily.   predniSONE 10 MG tablet Commonly known as:  DELTASONE Take 10 mg by mouth daily. For RA   promethazine 25 MG tablet Commonly known as:  PHENERGAN Take 25 mg by mouth every 12 (twelve) hours as needed for nausea or vomiting.   rosuvastatin 20 MG tablet Commonly known as:  CRESTOR Take 20 mg by mouth every evening. For  hyperlipidemia   sertraline 50 MG tablet Commonly known as:  ZOLOFT Take 50 mg by mouth every morning. For depression   Vitamin D 2000 units Caps Take 1 capsule (2,000 Units total) by mouth daily.       Review of Systems  Constitutional: Negative for activity change, appetite change, chills, fatigue and fever.  HENT: Negative for congestion, rhinorrhea, sinus pressure, sneezing and sore throat.   Eyes: Negative.   Respiratory: Negative for cough, chest tightness, shortness of breath and wheezing.   Cardiovascular: Negative for chest pain, palpitations and leg swelling.  Gastrointestinal: Negative for abdominal distention, abdominal pain, constipation, diarrhea, nausea and vomiting.  Genitourinary: Positive for flank pain. Negative for difficulty urinating, dysuria, frequency, urgency, vaginal discharge and vaginal pain.       Urinary symptoms per HPI   Musculoskeletal: Positive for gait problem.  Skin: Negative.   Psychiatric/Behavioral: Negative for agitation, confusion, hallucinations and sleep disturbance. The patient is not nervous/anxious.     Immunization History  Administered Date(s) Administered  . Influenza-Unspecified 05/09/2013, 04/27/2014, 04/17/2015, 09/17/2015  . PPD Test 12/22/2012, 03/14/2014, 03/08/2015  . Pneumococcal-Unspecified 05/09/2013, 09/09/2013   Pertinent  Health Maintenance Due  Topic Date Due  . INFLUENZA VACCINE  06/30/2017 (Originally 01/29/2016)  . DEXA SCAN  Completed  . PNA vac Low Risk Adult  Addressed   Fall Risk  10/04/2014  Falls in the past year? Yes  Number falls in past yr: 2 or more  Injury with Fall? No  Risk Factor Category  High Fall Risk  Risk for fall due to : Impaired balance/gait;Impaired mobility;Medication side effect;History of fall(s)  Follow up Education provided;Falls evaluation completed;Falls prevention discussed      Vitals:   04/08/16 1200  BP: 129/75  Pulse: 70  Resp: 16  Temp: 97.9 F (36.6 C)  SpO2: 96%    Weight: 137 lb 3.2 oz (62.2 kg)  Height: 5\' 6"  (1.676 m)   Body mass  index is 22.14 kg/m. Physical Exam  Constitutional: She is oriented to person, place, and time. She appears well-developed and well-nourished. No distress.  Elderly  HENT:  Head: Normocephalic and atraumatic.  Mouth/Throat: Oropharynx is clear and moist.  Eyes: Conjunctivae and EOM are normal. Pupils are equal, round, and reactive to light. Right eye exhibits no discharge. Left eye exhibits no discharge. No scleral icterus.  Neck: Normal range of motion. No JVD present. No thyromegaly present.  Cardiovascular: Normal rate, regular rhythm, normal heart sounds and intact distal pulses.  Exam reveals no gallop and no friction rub.   No murmur heard. Pulmonary/Chest: Effort normal and breath sounds normal. No respiratory distress. She has no wheezes. She has no rales.  Abdominal: Soft. Bowel sounds are normal. She exhibits no distension. There is no tenderness. There is no rebound and no guarding.  Genitourinary: No vaginal discharge found.  Genitourinary Comments: Continent.   Musculoskeletal: She exhibits no edema, tenderness or deformity.  Unsteady gait. Decreased strength to lower extremities.   Lymphadenopathy:    She has no cervical adenopathy.  Neurological: She is oriented to person, place, and time.  Skin: Skin is warm and dry. No rash noted. No erythema.  Psychiatric: She has a normal mood and affect.    Labs reviewed:  Recent Labs  07/24/15 1122 07/25/15 0706 07/26/15 0655 08/29/15 12/04/15 01/11/16  NA 143 141 139 144 145 147  K 3.0* 3.4* 3.3* 4.5 3.9 5.0  CL 107 109 108  --   --   --   CO2 21* 21* 19*  --   --   --   GLUCOSE 132* 83 87  --   --   --   BUN 20 18 12 21  29* 20  CREATININE 1.54* 1.52* 1.38* 1.3* 1.4* 1.2*  CALCIUM 8.5* 8.4* 8.1*  --   --   --   MG  --  1.4*  --   --   --   --     Recent Labs  07/22/15 1123 07/23/15 0226 08/29/15  AST 23 29 19   ALT 15 13* 16  ALKPHOS 66 46  44  BILITOT 1.1 0.8  --   PROT 6.3* 4.9*  --   ALBUMIN 2.9* 2.1*  --     Recent Labs  06/28/15 1555 07/22/15 1123  07/24/15 1122 07/25/15 0706 07/26/15 0655  12/04/15  01/17/16 01/24/16 02/11/16  WBC 13.9* 27.8*  < > 18.1* 17.5* 13.4*  < > 17.5  < > 9.1 19.0 9.4  NEUTROABS 12.5* 24.0*  --   --   --   --   --  13  --   --   --   --   HGB 12.4 13.0  < > 10.0* 10.5* 10.4*  < > 9.8*  --  8.9* 11.3* 9.4*  HCT 38.2 39.7  < > 31.0* 32.6* 32.0*  < > 34*  --  30* 36 30*  MCV 87.5 88.6  < > 88.8 89.6 87.9  --   --   --   --   --   --   PLT 173.0 116*  < > 204 128* 131*  < > 257  < > 164 184 174  < > = values in this interval not displayed. Lab Results  Component Value Date   TSH 0.62 01/11/2016   Lab Results  Component Value Date   HGBA1C 6.4 01/11/2016   Lab Results  Component Value Date   CHOL 109 01/11/2016  HDL 37 01/11/2016   LDLCALC 39 01/11/2016   LDLDIRECT 60.7 11/13/2010   TRIG 167 (A) 01/11/2016   CHOLHDL 2.9 12/02/2011    Assessment/Plan  Acute cystitis with hematuria Afebrile.urine analysis showed moderate amounts of leukocytes, trace blood with negative ntirites. Urine culture showed > 100000 colonies of Proteus Mirabilis. Symptomatic. Will start on Cipro 500 mg Tablet twice daily X 7 days. Florastor 250 Capsule twice daily by mouth X 10 days for prophylaxis.   Family/ staff Communication: Reviewed plan with Patient,Dr. Bubba Camp, Patient's son Camy [place] at [phone] and Facility Nurse supervisor.   Labs/tests ordered: None

## 2016-04-23 ENCOUNTER — Non-Acute Institutional Stay (SKILLED_NURSING_FACILITY): Payer: Medicare Other | Admitting: Family

## 2016-04-23 DIAGNOSIS — N183 Chronic kidney disease, stage 3 unspecified: Secondary | ICD-10-CM

## 2016-04-23 DIAGNOSIS — I130 Hypertensive heart and chronic kidney disease with heart failure and stage 1 through stage 4 chronic kidney disease, or unspecified chronic kidney disease: Secondary | ICD-10-CM | POA: Diagnosis not present

## 2016-04-23 DIAGNOSIS — M159 Polyosteoarthritis, unspecified: Secondary | ICD-10-CM

## 2016-04-23 DIAGNOSIS — E039 Hypothyroidism, unspecified: Secondary | ICD-10-CM

## 2016-04-23 DIAGNOSIS — F419 Anxiety disorder, unspecified: Secondary | ICD-10-CM | POA: Diagnosis not present

## 2016-04-23 DIAGNOSIS — I5032 Chronic diastolic (congestive) heart failure: Secondary | ICD-10-CM | POA: Diagnosis not present

## 2016-04-23 DIAGNOSIS — I5030 Unspecified diastolic (congestive) heart failure: Secondary | ICD-10-CM | POA: Diagnosis not present

## 2016-04-23 DIAGNOSIS — D638 Anemia in other chronic diseases classified elsewhere: Secondary | ICD-10-CM | POA: Diagnosis not present

## 2016-04-23 NOTE — Progress Notes (Signed)
Location:  [facility] Room Number: [id number]  Place of Service:  SNF (31) Provider: Dinah Ngetich FNP-C   [name], MD  Patient Care Team: [name], MD as PCP - General (Internal Medicine) [name], NP as Nurse Practitioner (Nurse Practitioner)  Extended Emergency Contact Information Primary Emergency Contact: Litchford,[name] Address: [id number], [place] Phone: [phone] Mobile Phone: [phone] Relation: Son Secondary Emergency Contact: [name] Phone: [phone] Mobile Phone: [phone] Relation: Daughter  Code Status:  Full Code  Goals of care: Advanced Directive information Advanced Directives 03/24/2016  Does patient have an advance directive? No  Type of Advance Directive -  Does patient want to make changes to advanced directive? -  Copy of advanced directive(s) in chart? -  Would patient like information on creating an advanced directive? -  Pre-existing out of facility DNR order (yellow form or pink MOST form) -     Chief Complaint  Patient presents with  . Medical Management of Chronic Issues    HPI:  Pt is a 82 y.o. female seen today at [facility] for medical management of chronic diseases.She has a medical history of HTN, CHF, Hypothyroidism, OA, GERD, CKD stage 3, Anxiety,Depression, Anemia among other conditions. She is seen in her room today. She states previous Urinary tract infection symptoms resolved. She request for another order for Lidoderm patch to be applied to right shoulder for pain. She currently has Biofreeze for shoulder pain but states ineffective. Has Lidoderm for lower back pain.Plan to change Lidoderm location to right shoulder for pain. Will continue with Hydrocodone PRN. Facility nurse reports no other concerns.      Past Medical History:  Diagnosis Date  . Anxiety   . Arthritis   .  Chronic diastolic CHF (congestive heart failure) ([place])    a. 10/2014 Echo: EF 60-65%, mild LVH, grade 1 DD, dynamic obstruction @ rest - peak velocity of 271 cm/sec, peak grad of 66mmHg, PASP 17mmHg.  Marland Kitchen Coronary artery disease    a. 2012 s/p NSTEMI/Cath: 95% apical LAD dzs, otw nonobs dzs-->Med Rx;  b. 05/2011 MV: no ischemia, EF 79%, inf infarct- attenuation.  . CVA (cerebral vascular accident) ([place])   . Depression   . DVT of lower extremity (deep venous thrombosis) ([place])   . Dyspnea   . Elevated troponin    a. 10/2014 in setting of sepsis/?HCAP.  Marland Kitchen Enteritis due to Clostridium difficile 01/04/2013  . Essential hypertension   . Fall   . Fatigue   . GERD (gastroesophageal reflux disease)   . GI bleed 2005  . HCAP (healthcare-associated pneumonia) 01/22/2013  . Hemorrhoids   . Hiatal hernia   . Hyperlipidemia   . Hypothyroidism   . Malnutrition ([place])   . Obstructive hypertrophic cardiomyopathy ([place])    a. 10/2014 Echo: EF 60-65%, mild LVH, grade 1 DD, dynamic obstruction @ rest - peak velocity of 271 cm/sec, peak grad of 7mmHg, PASP 63mmHg.  . Osteoporosis   . PUD (peptic ulcer disease)   . RA (rheumatoid arthritis) ([place])   . Recurrent colitis due to Clostridium difficile 06/02/2012  . Renal insufficiency   . Right ear pain   . Sepsis ([place]) 12/19/2012  . TIA (transient ischemic attack)   . Weight loss    Past Surgical History:  Procedure Laterality Date  . ANKLE SURGERY    .  BACK SURGERY    . [place]   x3  . CARDIAC CATHETERIZATION     SHOWED RUPTURE PLAQUE IN THE LAD. THE LAD IS NONOBSTRUCTIVE WITH ONLY 30-40% STENOSIS  . COLONOSCOPY    . KNEE SURGERY    . NSTEMI  06/2010  . SPINAL FUSION SURGERY      Allergies  Allergen Reactions  . Biphosphate   . Codeine Swelling  . Morphine And Related Other (See Comments)    unknown  . Percocet [Oxycodone-Acetaminophen]     unknown  . Plavix [Clopidogrel Bisulfate] Other (See Comments)    RASH  . Sulfur Other (See  Comments)    GI upset      Medication List       Accurate as of 04/23/16  3:36 PM. Always use your most recent med list.          ACT DRY MOUTH Lozg Use as directed in the mouth or throat as needed (for dry mouth).   aspirin 81 MG chewable tablet Chew 81 mg by mouth daily.   BIOFREEZE 4 % Gel Generic drug:  Menthol (Topical Analgesic) Apply 1 application topically every 8 (eight) hours.   calcium-vitamin D 500-200 MG-UNIT tablet Commonly known as:  OSCAL WITH D Take 1 tablet by mouth 3 (three) times daily.   CERTAVITE/ANTIOXIDANTS PO Take 1 tablet by mouth daily.   cetirizine 10 MG tablet Commonly known as:  ZYRTEC Take 10 mg by mouth daily. For depression   Cranberry 200 MG Caps Take 2 capsules by mouth 2 (two) times daily. UTI Prophylaxis   denosumab 60 MG/ML Soln injection Commonly known as:  PROLIA Inject 60 mg into the skin every 6 (six) months. Administer in upper arm, thigh, or abdomen   diclofenac sodium 1 % Gel Commonly known as:  VOLTAREN Apply 2 g topically every 6 (six) hours. Apply to left shoulder   ELIQUIS 5 MG Tabs tablet Generic drug:  apixaban Take 5 mg by mouth 2 (two) times daily. For DVT   esomeprazole 40 MG capsule Commonly known as:  NEXIUM Take 40 mg by mouth 2 (two) times daily before a meal. Take one capsule by mouth twice daily for stomach   ferrous sulfate 325 (65 FE) MG tablet Take 325 mg by mouth 3 (three) times daily with meals.   Fish Oil 1200 MG Caps Take 1,200 mg by mouth daily.   hydrochlorothiazide 12.5 MG tablet Commonly known as:  HYDRODIURIL Take 1 tablet (12.5 mg total) by mouth daily.   HYDROcodone-acetaminophen 5-325 MG tablet Commonly known as:  NORCO/VICODIN Take 1 tablet by mouth every 8 (eight) hours as needed for moderate pain.   HYDROcodone-acetaminophen 5-325 MG tablet Commonly known as:  NORCO/VICODIN Take one tablet by mouth twice daily for pain. Do not exceed 4gm of Tylenol in 24 hours     ipratropium 0.03 % nasal spray Commonly known as:  ATROVENT Place 2 sprays into both nostrils daily as needed (For asthma).   ipratropium-albuterol 0.5-2.5 (3) MG/3ML Soln Commonly known as:  DUONEB Take 3 mLs by nebulization every 8 (eight) hours as needed. For shortness of breath   levothyroxine 50 MCG tablet Commonly known as:  SYNTHROID, LEVOTHROID Take 50 mcg by mouth daily.   lidocaine 5 % Commonly known as:  LIDODERM Place 1 patch onto the skin daily. Remove & Discard patch within 12 hours or as directed by MD   LORazepam 0.5 MG tablet Commonly known as:  ATIVAN Take one tablet  by mouth every night at bedtime for anxiety   meclizine 12.5 MG tablet Commonly known as:  ANTIVERT Take 12.5 mg by mouth 2 (two) times daily.   methocarbamol 500 MG tablet Commonly known as:  ROBAXIN Take 500 mg by mouth at bedtime. For muscle spasms/shoulder pain.   metoprolol 50 MG tablet Commonly known as:  LOPRESSOR Take 50 mg by mouth 2 (two) times daily.   montelukast 10 MG tablet Commonly known as:  SINGULAIR Take 10 mg by mouth at bedtime.   nitroGLYCERIN 0.4 MG SL tablet Commonly known as:  NITROSTAT Place 0.4 mg under the tongue every 5 (five) minutes as needed for chest pain.   ondansetron 4 MG tablet Commonly known as:  ZOFRAN Take 4 mg by mouth every 6 (six) hours as needed for nausea or vomiting.   OXYGEN Inhale 2 L into the lungs.   PATADAY 0.2 % Soln Generic drug:  Olopatadine HCl Place 1 drop into both eyes daily.   predniSONE 10 MG tablet Commonly known as:  DELTASONE Take 10 mg by mouth daily. For RA   promethazine 25 MG tablet Commonly known as:  PHENERGAN Take 25 mg by mouth every 12 (twelve) hours as needed for nausea or vomiting.   rosuvastatin 20 MG tablet Commonly known as:  CRESTOR Take 20 mg by mouth every evening. For hyperlipidemia   sertraline 50 MG tablet Commonly known as:  ZOLOFT Take 50 mg by mouth every morning. For depression    Vitamin D 2000 units Caps Take 1 capsule (2,000 Units total) by mouth daily.       Review of Systems  Constitutional: Negative for activity change, appetite change, chills, fatigue and fever.  HENT: Negative for congestion, rhinorrhea, sinus pressure, sneezing and sore throat.   Eyes: Negative.   Respiratory: Negative for cough, chest tightness, shortness of breath and wheezing.   Cardiovascular: Negative for chest pain, palpitations and leg swelling.  Gastrointestinal: Negative for abdominal distention, abdominal pain, constipation, diarrhea, nausea and vomiting.  Endocrine: Negative.   Genitourinary: Negative for difficulty urinating, dysuria, flank pain, frequency, urgency, vaginal discharge and vaginal pain.  Musculoskeletal: Positive for gait problem.  Skin: Negative.   Neurological: Negative for dizziness, seizures, light-headedness and headaches.  Hematological: Does not bruise/bleed easily.  Psychiatric/Behavioral: Negative for agitation, confusion, hallucinations and sleep disturbance. The patient is not nervous/anxious.     Immunization History  Administered Date(s) Administered  . Influenza-Unspecified 05/09/2013, 04/27/2014, 04/17/2015, 09/17/2015  . PPD Test 12/22/2012, 03/14/2014, 03/08/2015  . Pneumococcal-Unspecified 05/09/2013, 09/09/2013   Pertinent  Health Maintenance Due  Topic Date Due  . INFLUENZA VACCINE  06/30/2017 (Originally 01/29/2016)  . DEXA SCAN  Completed  . PNA vac Low Risk Adult  Addressed   Fall Risk  10/04/2014  Falls in the past year? Yes  Number falls in past yr: 2 or more  Injury with Fall? No  Risk Factor Category  High Fall Risk  Risk for fall due to : Impaired balance/gait;Impaired mobility;Medication side effect;History of fall(s)  Follow up Education provided;Falls evaluation completed;Falls prevention discussed      Vitals:   04/23/16 1200  BP: (!) 159/76  Pulse: 68  Resp: 20  Temp: 97.8 F (36.6 C)  SpO2: 98%  Weight: 134  lb 3.2 oz (60.9 kg)  Height: 5\' 6"  (1.676 m)   Body mass index is 21.66 kg/m. Physical Exam  Constitutional: She is oriented to person, place, and time. She appears well-developed and well-nourished.  Elderly in no acute distress  HENT:  Head: Normocephalic and atraumatic.  Mouth/Throat: Oropharynx is clear and moist.  Eyes: Conjunctivae and EOM are normal. Pupils are equal, round, and reactive to light. Right eye exhibits no discharge. Left eye exhibits no discharge. No scleral icterus.  Neck: Normal range of motion. No JVD present. No thyromegaly present.  Cardiovascular: Normal rate, regular rhythm, normal heart sounds and intact distal pulses.  Exam reveals no gallop and no friction rub.   No murmur heard. Pulmonary/Chest: Effort normal and breath sounds normal. No respiratory distress. She has no wheezes. She has no rales.  Abdominal: Soft. Bowel sounds are normal. She exhibits no distension. There is no tenderness. There is no rebound and no guarding.  Genitourinary: No vaginal discharge found.  Genitourinary Comments: Continent.   Musculoskeletal: She exhibits no edema, tenderness or deformity.  Unsteady gait.Moves x 4 extremities.   Lymphadenopathy:    She has no cervical adenopathy.  Neurological: She is oriented to person, place, and time.  Skin: Skin is warm and dry. No rash noted. No erythema.  Psychiatric: She has a normal mood and affect.    Labs reviewed:  Recent Labs  07/24/15 1122 07/25/15 0706 07/26/15 0655 08/29/15 12/04/15 01/11/16  NA 143 141 139 144 145 147  K 3.0* 3.4* 3.3* 4.5 3.9 5.0  CL 107 109 108  --   --   --   CO2 21* 21* 19*  --   --   --   GLUCOSE 132* 83 87  --   --   --   BUN 20 18 12 21  29* 20  CREATININE 1.54* 1.52* 1.38* 1.3* 1.4* 1.2*  CALCIUM 8.5* 8.4* 8.1*  --   --   --   MG  --  1.4*  --   --   --   --     Recent Labs  07/22/15 1123 07/23/15 0226 08/29/15  AST 23 29 19   ALT 15 13* 16  ALKPHOS 66 46 44  BILITOT 1.1 0.8  --     PROT 6.3* 4.9*  --   ALBUMIN 2.9* 2.1*  --     Recent Labs  06/28/15 1555 07/22/15 1123  07/24/15 1122 07/25/15 0706 07/26/15 0655  12/04/15  01/17/16 01/24/16 02/11/16  WBC 13.9* 27.8*  < > 18.1* 17.5* 13.4*  < > 17.5  < > 9.1 19.0 9.4  NEUTROABS 12.5* 24.0*  --   --   --   --   --  13  --   --   --   --   HGB 12.4 13.0  < > 10.0* 10.5* 10.4*  < > 9.8*  --  8.9* 11.3* 9.4*  HCT 38.2 39.7  < > 31.0* 32.6* 32.0*  < > 34*  --  30* 36 30*  MCV 87.5 88.6  < > 88.8 89.6 87.9  --   --   --   --   --   --   PLT 173.0 116*  < > 204 128* 131*  < > 257  < > 164 184 174  < > = values in this interval not displayed. Lab Results  Component Value Date   TSH 0.62 01/11/2016   Lab Results  Component Value Date   HGBA1C 6.4 01/11/2016   Lab Results  Component Value Date   CHOL 109 01/11/2016   HDL 37 01/11/2016   LDLCALC 39 01/11/2016   LDLDIRECT 60.7 11/13/2010   TRIG 167 (A) 01/11/2016   CHOLHDL 2.9 12/02/2011   Assessment/Plan  1. 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 Continue on Metoprolol and Hydrochlorothiazide. Monitor BMP   2. Chronic diastolic CHF (congestive heart failure) (HCC) Stable. No recent weight gain or edema. Exam findings negative for rales, wheezes or shortness of breath. Continue on Metoprolol  3. Hypothyroidism, unspecified type Continue on levothyroxine. Monitor TSH level.   4. Generalized osteoarthritis Request Lidoderm to right shoulder. Application site order changed tro right shoulder.continue on Hydrocodone-APAP. Continue to monitor.    5. Anxiety Stable. Continue on lorazepam PRN. Monitor for mood changes.   6. Anemia of chronic disease Continue on ferrous sulfate and MVI. Monitor CBC.     Family/ staff Communication: Reviewed plan of care with patient and facility Nurse and Nurse supervisor.   Labs/tests ordered:  None

## 2016-05-08 ENCOUNTER — Encounter: Payer: Self-pay | Admitting: Internal Medicine

## 2016-05-08 ENCOUNTER — Non-Acute Institutional Stay (SKILLED_NURSING_FACILITY): Payer: Medicare Other | Admitting: Internal Medicine

## 2016-05-08 DIAGNOSIS — N3090 Cystitis, unspecified without hematuria: Secondary | ICD-10-CM

## 2016-05-08 DIAGNOSIS — Z298 Encounter for other specified prophylactic measures: Secondary | ICD-10-CM

## 2016-05-08 NOTE — Progress Notes (Signed)
Patient ID: [name], female   DOB: [birth year], 82 y.o.   MRN: [id number]     [facility]  PCP: [name], MD  Code Status: Full Code   Allergies  Allergen Reactions  . Biphosphate   . Codeine Swelling  . Morphine And Related Other (See Comments)    unknown  . Percocet [Oxycodone-Acetaminophen]     unknown  . Plavix [Clopidogrel Bisulfate] Other (See Comments)    RASH  . Sulfur Other (See Comments)    GI upset    Chief Complaint  Patient presents with  . Acute Visit    recurrent UTI    Advanced Directives 03/24/2016  Does patient have an advance directive? No  Type of Advance Directive -  Does patient want to make changes to advanced directive? -  Copy of advanced directive(s) in chart? -  Would patient like information on creating an advanced directive? -  Pre-existing out of facility DNR order (yellow form or pink MOST form) -    HPI:  82 y.o. patient is seen for acute visit. She has had recurrent UTIs this year with one leading to urosepsis. She and her family would like for her to be placed on antibiotic for prevention of recurrent UTI. She has been at her baseline per nursing. She denies any complaint this visit.   Review of Systems:  Constitutional: Negative for fever and chills HENT: Negative for headache, congestion Gastrointestinal: Negative for heartburn, nausea, vomiting, abdominal pain  Genitourinary: Negative for dysuria, urgency, hematuria  Musculoskeletal: Negative for back pain, fall Skin: Negative for itching, rash.    Past Medical History:  Diagnosis Date  . Anxiety   . Arthritis   . Chronic diastolic CHF (congestive heart failure) ([place])    a. 10/2014 Echo: EF 60-65%, mild LVH, grade 1 DD, dynamic obstruction @ rest - peak velocity of 271 cm/sec, peak grad of 36mmHg, PASP 54mmHg.  Marland Kitchen Coronary artery disease    a. 2012 s/p NSTEMI/Cath: 95% apical LAD dzs, otw nonobs dzs-->Med Rx;  b. 05/2011 MV: no ischemia, EF 79%, inf  infarct- attenuation.  . CVA (cerebral vascular accident) ([place])   . Depression   . DVT of lower extremity (deep venous thrombosis) ([place])   . Dyspnea   . Elevated troponin    a. 10/2014 in setting of sepsis/?HCAP.  Marland Kitchen Enteritis due to Clostridium difficile 01/04/2013  . Essential hypertension   . Fall   . Fatigue   . GERD (gastroesophageal reflux disease)   . GI bleed 2005  . HCAP (healthcare-associated pneumonia) 01/22/2013  . Hemorrhoids   . Hiatal hernia   . Hyperlipidemia   . Hypothyroidism   . Malnutrition ([place])   . Obstructive hypertrophic cardiomyopathy ([place])    a. 10/2014 Echo: EF 60-65%, mild LVH, grade 1 DD, dynamic obstruction @ rest - peak velocity of 271 cm/sec, peak grad of 21mmHg, PASP 3mmHg.  . Osteoporosis   . PUD (peptic ulcer disease)   . RA (rheumatoid arthritis) ([place])   . Recurrent colitis due to Clostridium difficile 06/02/2012  . Renal insufficiency   . Right ear pain   . Sepsis ([place]) 12/19/2012  . TIA (transient ischemic attack)   . Weight loss    Past Surgical History:  Procedure Laterality Date  . ANKLE SURGERY    . BACK SURGERY    . [place]   x3  . CARDIAC CATHETERIZATION     SHOWED RUPTURE PLAQUE IN THE LAD. THE LAD IS  NONOBSTRUCTIVE WITH ONLY 30-40% STENOSIS  . COLONOSCOPY    . KNEE SURGERY    . NSTEMI  06/2010  . SPINAL FUSION SURGERY       Medications:   Medication List       Accurate as of 05/08/16  1:45 PM. Always use your most recent med list.          ACT DRY MOUTH Lozg Use as directed in the mouth or throat as needed (for dry mouth).   aspirin 81 MG chewable tablet Chew 81 mg by mouth daily.   BIOFREEZE 4 % Gel Generic drug:  Menthol (Topical Analgesic) Apply 1 application topically every 8 (eight) hours.   calcium-vitamin D 500-200 MG-UNIT tablet Commonly known as:  OSCAL WITH D Take 1 tablet by mouth 3 (three) times daily.   carboxymethylcellulose 1 % ophthalmic solution Apply 1 drop to eye 3 (three) times  daily.   CERTAVITE/ANTIOXIDANTS PO Take 1 tablet by mouth daily.   cetirizine 10 MG tablet Commonly known as:  ZYRTEC Take 10 mg by mouth daily. For depression   Cranberry 200 MG Caps Take 2 capsules by mouth 2 (two) times daily. UTI Prophylaxis   denosumab 60 MG/ML Soln injection Commonly known as:  PROLIA Inject 60 mg into the skin every 6 (six) months. Administer in upper arm, thigh, or abdomen   diclofenac sodium 1 % Gel Commonly known as:  VOLTAREN Apply 2 g topically every 6 (six) hours. Apply to left shoulder   ELIQUIS 5 MG Tabs tablet Generic drug:  apixaban Take 5 mg by mouth 2 (two) times daily. For DVT   esomeprazole 40 MG capsule Commonly known as:  NEXIUM Take 40 mg by mouth 2 (two) times daily before a meal. Take one capsule by mouth twice daily for stomach   ferrous sulfate 325 (65 FE) MG tablet Take 325 mg by mouth 3 (three) times daily with meals.   Fish Oil 1200 MG Caps Take 1,200 mg by mouth daily.   hydrochlorothiazide 12.5 MG tablet Commonly known as:  HYDRODIURIL Take 1 tablet (12.5 mg total) by mouth daily.   HYDROcodone-acetaminophen 5-325 MG tablet Commonly known as:  NORCO/VICODIN Take 1 tablet by mouth every 8 (eight) hours as needed for moderate pain.   HYDROcodone-acetaminophen 5-325 MG tablet Commonly known as:  NORCO/VICODIN Take one tablet by mouth twice daily for pain. Do not exceed 4gm of Tylenol in 24 hours   ipratropium 0.03 % nasal spray Commonly known as:  ATROVENT Place 2 sprays into both nostrils daily as needed (For asthma).   ipratropium-albuterol 0.5-2.5 (3) MG/3ML Soln Commonly known as:  DUONEB Take 3 mLs by nebulization every 8 (eight) hours as needed. For shortness of breath   levothyroxine 50 MCG tablet Commonly known as:  SYNTHROID, LEVOTHROID Take 50 mcg by mouth daily.   lidocaine 5 % Commonly known as:  LIDODERM Place 1 patch onto the skin daily. Remove & Discard patch within 12 hours or as directed by  MD   LORazepam 0.5 MG tablet Commonly known as:  ATIVAN Take one tablet by mouth every night at bedtime for anxiety   meclizine 12.5 MG tablet Commonly known as:  ANTIVERT Take 12.5 mg by mouth 2 (two) times daily.   methocarbamol 500 MG tablet Commonly known as:  ROBAXIN Take 500 mg by mouth at bedtime. For muscle spasms/shoulder pain.   metoprolol 50 MG tablet Commonly known as:  LOPRESSOR Take 50 mg by mouth 2 (two) times daily.  montelukast 10 MG tablet Commonly known as:  SINGULAIR Take 10 mg by mouth at bedtime.   nitroGLYCERIN 0.4 MG SL tablet Commonly known as:  NITROSTAT Place 0.4 mg under the tongue every 5 (five) minutes as needed for chest pain.   ondansetron 4 MG tablet Commonly known as:  ZOFRAN Take 4 mg by mouth every 6 (six) hours as needed for nausea or vomiting.   OXYGEN Inhale 2 L into the lungs.   PATADAY 0.2 % Soln Generic drug:  Olopatadine HCl Place 1 drop into both eyes daily.   predniSONE 10 MG tablet Commonly known as:  DELTASONE Take 10 mg by mouth daily. For RA   promethazine 25 MG tablet Commonly known as:  PHENERGAN Take 25 mg by mouth every 12 (twelve) hours as needed for nausea or vomiting.   rosuvastatin 20 MG tablet Commonly known as:  CRESTOR Take 20 mg by mouth every evening. For hyperlipidemia   sertraline 50 MG tablet Commonly known as:  ZOLOFT Take 50 mg by mouth every morning. For depression   Vitamin D 2000 units Caps Take 1 capsule (2,000 Units total) by mouth daily.        Physical Exam: Vitals:   05/08/16 1134  BP: (!) 164/75  Pulse: 84  Resp: 16  Temp: 98.6 F (37 C)  TempSrc: Oral  SpO2: 96%  Weight: 133 lb 12.8 oz (60.7 kg)  Height: 5\' 6"  (1.676 m)   Body mass index is 21.6 kg/m.  General- elderly female, well built, in no acute distress Head- normocephalic, atraumatic Throat- moist mucus membrane Eyes- PERRLA, EOMI, no pallor, no icterus Neck- no cervical lymphadenopathy Cardiovascular-  normal s1,s2, no murmur Respiratory- bilateral clear to auscultation Abdomen- bowel sounds present, soft, non tender, no guarding or rigidity Musculoskeletal- able to move all 4 extremities, self propels on wheelchair Neurological- alert and oriented Skin- warm and dry Psychiatry- normal mood and affect    Labs reviewed: Basic Metabolic Panel:  Recent Labs  07/24/15 1122 07/25/15 0706 07/26/15 0655 08/29/15 12/04/15 01/11/16  NA 143 141 139 144 145 147  K 3.0* 3.4* 3.3* 4.5 3.9 5.0  CL 107 109 108  --   --   --   CO2 21* 21* 19*  --   --   --   GLUCOSE 132* 83 87  --   --   --   BUN 20 18 12 21  29* 20  CREATININE 1.54* 1.52* 1.38* 1.3* 1.4* 1.2*  CALCIUM 8.5* 8.4* 8.1*  --   --   --   MG  --  1.4*  --   --   --   --    Liver Function Tests:  Recent Labs  07/22/15 1123 07/23/15 0226 08/29/15  AST 23 29 19   ALT 15 13* 16  ALKPHOS 66 46 44  BILITOT 1.1 0.8  --   PROT 6.3* 4.9*  --   ALBUMIN 2.9* 2.1*  --    No results for input(s): LIPASE, AMYLASE in the last 8760 hours. No results for input(s): AMMONIA in the last 8760 hours. CBC:  Recent Labs  06/28/15 1555 07/22/15 1123  07/24/15 1122 07/25/15 0706 07/26/15 0655  12/04/15  01/24/16 02/11/16 02/21/16  WBC 13.9* 27.8*  < > 18.1* 17.5* 13.4*  < > 17.5  < > 19.0 9.4 10.7  NEUTROABS 12.5* 24.0*  --   --   --   --   --  13  --   --   --   --  HGB 12.4 13.0  < > 10.0* 10.5* 10.4*  < > 9.8*  < > 11.3* 9.4* 8.9*  HCT 38.2 39.7  < > 31.0* 32.6* 32.0*  < > 34*  < > 36 30* 29*  MCV 87.5 88.6  < > 88.8 89.6 87.9  --   --   --   --   --   --   PLT 173.0 116*  < > 204 128* 131*  < > 257  < > 184 174 183  < > = values in this interval not displayed.  Lab Results  Component Value Date   TSH 0.53 03/04/2016     Assessment/Plan  UTI prophylaxis Start nitrofurantoin 100 mg daily for now and monitor for symptom of urinary tract infection. Expected UTI per year is 0-0.7 with this. Daughter wants her to be on  trimethoprim. Explained to patient that I am trying nitrofurantoin because of decreased recurrence rate with this compared to trimethoprim.  Recurrent cystitis Currently asymptomatic. Monitor clinically. Encouraged hydration. Perineal hygiene to be maintained   Family/ staff Communication: reviewed care plan with patient and nursing supervisor    [name], MD Internal Medicine [place], [place] [address] Cell Phone (Monday-Friday 8 am - 5 pm): [phone] On Call: [phone] and follow prompts after 5 pm and on weekends Office Phone: [phone] Office Fax: [phone]

## 2016-05-12 ENCOUNTER — Other Ambulatory Visit: Payer: Self-pay | Admitting: *Deleted

## 2016-05-12 MED ORDER — HYDROCODONE-ACETAMINOPHEN 5-325 MG PO TABS: ORAL_TABLET | ORAL | 0 refills | Status: DC

## 2016-05-12 NOTE — Telephone Encounter (Signed)
[facility] [phone] Fax: [phone]  

## 2016-05-14 ENCOUNTER — Ambulatory Visit (INDEPENDENT_AMBULATORY_CARE_PROVIDER_SITE_OTHER): Payer: Medicare Other

## 2016-05-14 ENCOUNTER — Ambulatory Visit (INDEPENDENT_AMBULATORY_CARE_PROVIDER_SITE_OTHER): Payer: Medicare Other | Admitting: Orthopedic Surgery

## 2016-05-14 ENCOUNTER — Encounter (INDEPENDENT_AMBULATORY_CARE_PROVIDER_SITE_OTHER): Payer: Self-pay | Admitting: Orthopedic Surgery

## 2016-05-14 VITALS — BP 139/67 | HR 80 | Resp 16 | Ht 66.0 in | Wt 135.0 lb

## 2016-05-14 DIAGNOSIS — G8929 Other chronic pain: Secondary | ICD-10-CM

## 2016-05-14 DIAGNOSIS — M25511 Pain in right shoulder: Secondary | ICD-10-CM

## 2016-05-14 DIAGNOSIS — M542 Cervicalgia: Secondary | ICD-10-CM

## 2016-05-14 NOTE — Progress Notes (Unsigned)
Office Visit Note   Patient: [name]           Date of Birth: October 17, 1929           MRN: [id number] Visit Date: 05/14/2016              Requested by: Cherylann Ratel, PA-C [place], [place] [address] PCP: [name], MD   Assessment & Plan: Visit Diagnoses:  1. Chronic right shoulder pain   2. Cervical spine pain     Plan: f/u prn.Persayis has significant osteoarthritis of her cervical spine as well as her right shoulder. See her back in a when necessary basis to monitor for the above.  Follow-Up Instructions: Return if symptoms worsen or fail to improve.   Orders:  No orders of the defined types were placed in this encounter.  No orders of the defined types were placed in this encounter.     Procedures: Large Joint Inj Date/Time: 05/14/2016 6:10 PM Performed by: Garald Balding Authorized by: Garald Balding   Consent Given by:  Patient Timeout: prior to procedure the correct patient, procedure, and site was verified   Indications:  Pain Location:  Shoulder Site:  R glenohumeral Prep: patient was prepped and draped in usual sterile fashion   Needle Size:  25 G Needle Length:  1.5 inches Approach:  Posterior Ultrasound Guidance: No   Fluoroscopic Guidance: No   Arthrogram: No   Medications:  2 mL lidocaine 1 %; 2 mL bupivacaine 0.5 %; 80 mg methylPREDNISolone acetate 40 MG/ML Aspiration Attempted: No   Patient tolerance:  Patient tolerated the procedure well with no immediate complications     Clinical Data: No additional findings.   Subjective: No chief complaint on file.   HPI  Review of Systems   Objective: Vital Signs: LMP  (LMP Unknown)   Physical Exam  Ortho Exam  Specialty Comments:  No specialty comments available.  Imaging: No results found.   PMFS History: Patient Active Problem List   Diagnosis Date Noted  . UTI (urinary tract infection) 04/08/2016  . Benign paroxysmal positional vertigo  12/19/2015  . Easy bruising 09/20/2015  . CKD (chronic kidney disease) stage 3, GFR 30-59 ml/min 07/27/2015  . Anemia of chronic disease 07/27/2015  . Metabolic encephalopathy [id number]  . Pulmonary hypertension   . Generalized osteoarthritis 04/11/2015  . Thyroid activity decreased 04/11/2015  . Coronary artery disease   . Obstructive hypertrophic cardiomyopathy ([place])   . Chronic diastolic CHF (congestive heart failure) ([place])   . Vertigo 05/17/2014  . Hemorrhoids 04/24/2014  . Hypothyroidism 12/16/2013  . HLD (hyperlipidemia) 12/16/2013  . Benign hypertensive heart and kidney disease with diastolic CHF, NYHA class II and CKD stage III ([place]) 12/16/2013  . Allergic rhinitis 07/05/2013  . Adrenal insufficiency ([place]) 12/08/2011    Class: Chronic  . History of CVA (cerebrovascular accident) 12/07/2011    Class: Acute  . Dysphagia 12/07/2011    Class: Acute  . MI (myocardial infarction)   . RA (rheumatoid arthritis) ([place])   . PUD (peptic ulcer disease)   . Hiatal hernia with gastroesophageal reflux   . Hiatal hernia   . Osteoporosis   . Depression   . Anxiety    Past Medical History:  Diagnosis Date  . Anxiety   . Arthritis   . Chronic diastolic CHF (congestive heart failure) ([place])    a. 10/2014 Echo: EF 60-65%, mild LVH, grade 1 DD, dynamic obstruction @ rest - peak velocity of 271  cm/sec, peak grad of 72mmHg, PASP 9mmHg.  Marland Kitchen Coronary artery disease    a. 2012 s/p NSTEMI/Cath: 95% apical LAD dzs, otw nonobs dzs-->Med Rx;  b. 05/2011 MV: no ischemia, EF 79%, inf infarct- attenuation.  . CVA (cerebral vascular accident) ([place])   . Depression   . DVT of lower extremity (deep venous thrombosis) ([place])   . Dyspnea   . Elevated troponin    a. 10/2014 in setting of sepsis/?HCAP.  Marland Kitchen Enteritis due to Clostridium difficile 01/04/2013  . Essential hypertension   . Fall   . Fatigue   . GERD (gastroesophageal reflux disease)   . GI bleed 2005  . HCAP (healthcare-associated pneumonia)  01/22/2013  . Hemorrhoids   . Hiatal hernia   . Hyperlipidemia   . Hypothyroidism   . Malnutrition ([place])   . Obstructive hypertrophic cardiomyopathy ([place])    a. 10/2014 Echo: EF 60-65%, mild LVH, grade 1 DD, dynamic obstruction @ rest - peak velocity of 271 cm/sec, peak grad of 71mmHg, PASP 51mmHg.  . Osteoporosis   . PUD (peptic ulcer disease)   . RA (rheumatoid arthritis) ([place])   . Recurrent colitis due to Clostridium difficile 06/02/2012  . Renal insufficiency   . Right ear pain   . Sepsis ([place]) 12/19/2012  . TIA (transient ischemic attack)   . Weight loss     Family History  Problem Relation Age of Onset  . Kidney disease Mother   . Kidney disease Brother   . Lung cancer Father     Past Surgical History:  Procedure Laterality Date  . ANKLE SURGERY    . BACK SURGERY    . [place]   x3  . CARDIAC CATHETERIZATION     SHOWED RUPTURE PLAQUE IN THE LAD. THE LAD IS NONOBSTRUCTIVE WITH ONLY 30-40% STENOSIS  . COLONOSCOPY    . KNEE SURGERY    . NSTEMI  06/2010  . SPINAL FUSION SURGERY     Social History   Occupational History  . retired Orthoptist    Social History Main Topics  . Smoking status: Never Smoker  . Smokeless tobacco: Never Used  . Alcohol use No  . Drug use: No  . Sexual activity: No

## 2016-05-14 NOTE — Progress Notes (Signed)
Office Visit Note   Patient: [name]           Date of Birth: 12/26/1929           MRN: [id number] Visit Date: 05/14/2016              Requested by: [name], MD [address] [place], [place] [address] PCP: [name], MD   Assessment & Plan: Visit Diagnoses: No diagnosis found. Askew arthritis right shoulder and cervical spine with a possible component of rheumatoid arthritis. He will plan to inject her right shoulder with cortisone and monitor her in follow-up. We will not plan any treatment for her neck at this point she seemed to be relatively comfortable area and we may consider physical therapy at some point in the future.  Plan: f/u prn for right shoulder pain  Follow-Up Instructions: No Follow-up on file.   Orders:  No orders of the defined types were placed in this encounter.  No orders of the defined types were placed in this encounter.     Procedures: No procedures performed   Clinical Data: No additional findings.   Subjective: Chief Complaint  Patient presents with  . Neck - Pain    Neck pain that radiates into right shoulder, limited range of motion, uses voltaren gel - helps some    Review of Systems   Objective: Vital Signs: BP 139/67 (BP Location: Left Arm, Patient Position: Sitting, Cuff Size: Large)   Pulse 80   Resp 16   Ht 5\' 6"  (1.676 m)   Wt 135 lb (61.2 kg)   LMP  (LMP Unknown)   BMI 21.79 kg/m   Physical Exam  Ortho Exam injury motion of right shoulder included 130 of flexion, 110 of abduction, external rotation 80 internal rotation 30 with some "clunking". Appears to be diffuse atrophy about the to the shoulder biceps appears to be intact she had a good grip and good release.  He did have limited range of motion of the cervical spine lacking about a fingerbreadth and a half from her touching her chin to her chest and limited neck extension. She did not have any referred pain to either upper extremity or  shoulder with any motion.  Specialty Comments:  No specialty comments available.  Imaging: No results found.   PMFS History: Patient Active Problem List   Diagnosis Date Noted  . UTI (urinary tract infection) 04/08/2016  . Benign paroxysmal positional vertigo 12/19/2015  . Easy bruising 09/20/2015  . CKD (chronic kidney disease) stage 3, GFR 30-59 ml/min 07/27/2015  . Anemia of chronic disease 07/27/2015  . Metabolic encephalopathy [id number]  . Pulmonary hypertension   . Generalized osteoarthritis 04/11/2015  . Thyroid activity decreased 04/11/2015  . Coronary artery disease   . Obstructive hypertrophic cardiomyopathy ([place])   . Chronic diastolic CHF (congestive heart failure) ([place])   . Vertigo 05/17/2014  . Hemorrhoids 04/24/2014  . Hypothyroidism 12/16/2013  . HLD (hyperlipidemia) 12/16/2013  . Benign hypertensive heart and kidney disease with diastolic CHF, NYHA class II and CKD stage III ([place]) 12/16/2013  . Allergic rhinitis 07/05/2013  . Adrenal insufficiency ([place]) 12/08/2011    Class: Chronic  . History of CVA (cerebrovascular accident) 12/07/2011    Class: Acute  . Dysphagia 12/07/2011    Class: Acute  . MI (myocardial infarction)   . RA (rheumatoid arthritis) ([place])   . PUD (peptic ulcer disease)   . Hiatal hernia with gastroesophageal reflux   . Hiatal hernia   .  Osteoporosis   . Depression   . Anxiety    Past Medical History:  Diagnosis Date  . Anxiety   . Arthritis   . Chronic diastolic CHF (congestive heart failure) ([place])    a. 10/2014 Echo: EF 60-65%, mild LVH, grade 1 DD, dynamic obstruction @ rest - peak velocity of 271 cm/sec, peak grad of 42mmHg, PASP 70mmHg.  Marland Kitchen Coronary artery disease    a. 2012 s/p NSTEMI/Cath: 95% apical LAD dzs, otw nonobs dzs-->Med Rx;  b. 05/2011 MV: no ischemia, EF 79%, inf infarct- attenuation.  . CVA (cerebral vascular accident) ([place])   . Depression   . DVT of lower extremity (deep venous thrombosis) ([place])   . Dyspnea     . Elevated troponin    a. 10/2014 in setting of sepsis/?HCAP.  Marland Kitchen Enteritis due to Clostridium difficile 01/04/2013  . Essential hypertension   . Fall   . Fatigue   . GERD (gastroesophageal reflux disease)   . GI bleed 2005  . HCAP (healthcare-associated pneumonia) 01/22/2013  . Hemorrhoids   . Hiatal hernia   . Hyperlipidemia   . Hypothyroidism   . Malnutrition ([place])   . Obstructive hypertrophic cardiomyopathy ([place])    a. 10/2014 Echo: EF 60-65%, mild LVH, grade 1 DD, dynamic obstruction @ rest - peak velocity of 271 cm/sec, peak grad of 88mmHg, PASP 1mmHg.  . Osteoporosis   . PUD (peptic ulcer disease)   . RA (rheumatoid arthritis) ([place])   . Recurrent colitis due to Clostridium difficile 06/02/2012  . Renal insufficiency   . Right ear pain   . Sepsis ([place]) 12/19/2012  . TIA (transient ischemic attack)   . Weight loss     Family History  Problem Relation Age of Onset  . Kidney disease Mother   . Kidney disease Brother   . Lung cancer Father     Past Surgical History:  Procedure Laterality Date  . ANKLE SURGERY    . BACK SURGERY    . [place]   x3  . CARDIAC CATHETERIZATION     SHOWED RUPTURE PLAQUE IN THE LAD. THE LAD IS NONOBSTRUCTIVE WITH ONLY 30-40% STENOSIS  . COLONOSCOPY    . KNEE SURGERY    . NSTEMI  06/2010  . SPINAL FUSION SURGERY     Social History   Occupational History  . retired Orthoptist    Social History Main Topics  . Smoking status: Never Smoker  . Smokeless tobacco: Never Used  . Alcohol use No  . Drug use: No  . Sexual activity: No

## 2016-05-19 ENCOUNTER — Ambulatory Visit (INDEPENDENT_AMBULATORY_CARE_PROVIDER_SITE_OTHER): Payer: Medicare Other | Admitting: Orthopaedic Surgery

## 2016-05-23 NOTE — Progress Notes (Signed)
Location:  [facility] Room Number: [id number]  Place of Service:  SNF (31) Provider: Jadore Veals FNP-C   [name], MD  Patient Care Team: [name], MD as PCP - General (Internal Medicine) [name], NP as Nurse Practitioner (Nurse Practitioner)  Extended Emergency Contact Information Primary Emergency Contact: Gholson,[name] Address: [id number] [place], [place] Phone: [phone] Mobile Phone: [phone] Relation: Son Secondary Emergency Contact: [name] Phone: [phone] Mobile Phone: [phone] Relation: Daughter  Code Status:  Full Code  Goals of care: Advanced Directive information Advanced Directives 03/24/2016  Does Patient Have a Medical Advance Directive? No  Type of Advance Directive -  Does patient want to make changes to medical advance directive? -  Copy of [place] in Chart? -  Would patient like information on creating a medical advance directive? -  Pre-existing out of facility DNR order (yellow form or pink MOST form) -     Chief Complaint  Patient presents with  . Acute Visit    Leukocytosis     HPI:  Pt is a 82 y.o. female seen today at [facility] for an acute visit for evaluation of leukocytosis. Her recent WBC 17.5, Hgb 9.8 ( 12/04/2015). Previous Hgb 9.1 She was recently received Amoxicillin 500 mg  4 Tablets prior to her dental cleaning. She denies any fever, chills or cough. Facility staff reports no new concerns.    Past Medical History:  Diagnosis Date  . Anxiety   . Arthritis   . Chronic diastolic CHF (congestive heart failure) ([place])    a. 10/2014 Echo: EF 60-65%, mild LVH, grade 1 DD, dynamic obstruction @ rest - peak velocity of 271 cm/sec, peak grad of 80mmHg, PASP 38mmHg.  Marland Kitchen Coronary artery disease    a. 2012 s/p NSTEMI/Cath: 95% apical LAD dzs, otw nonobs dzs-->Med Rx;  b.  05/2011 MV: no ischemia, EF 79%, inf infarct- attenuation.  . CVA (cerebral vascular accident) ([place])   . Depression   . DVT of lower extremity (deep venous thrombosis) ([place])   . Dyspnea   . Elevated troponin    a. 10/2014 in setting of sepsis/?HCAP.  Marland Kitchen Enteritis due to Clostridium difficile 01/04/2013  . Essential hypertension   . Fall   . Fatigue   . GERD (gastroesophageal reflux disease)   . GI bleed 2005  . HCAP (healthcare-associated pneumonia) 01/22/2013  . Hemorrhoids   . Hiatal hernia   . Hyperlipidemia   . Hypothyroidism   . Malnutrition ([place])   . Obstructive hypertrophic cardiomyopathy ([place])    a. 10/2014 Echo: EF 60-65%, mild LVH, grade 1 DD, dynamic obstruction @ rest - peak velocity of 271 cm/sec, peak grad of 77mmHg, PASP 53mmHg.  . Osteoporosis   . PUD (peptic ulcer disease)   . RA (rheumatoid arthritis) ([place])   . Recurrent colitis due to Clostridium difficile 06/02/2012  . Renal insufficiency   . Right ear pain   . Sepsis ([place]) 12/19/2012  . TIA (transient ischemic attack)   . Weight loss    Past Surgical History:  Procedure Laterality Date  . ANKLE SURGERY    . BACK SURGERY    . [place]   x3  . CARDIAC CATHETERIZATION     SHOWED RUPTURE PLAQUE IN THE LAD. THE LAD IS NONOBSTRUCTIVE WITH ONLY 30-40% STENOSIS  .  COLONOSCOPY    . KNEE SURGERY    . NSTEMI  06/2010  . SPINAL FUSION SURGERY      Allergies  Allergen Reactions  . Biphosphate   . Codeine Swelling  . Morphine And Related Other (See Comments)    unknown  . Percocet [Oxycodone-Acetaminophen]     unknown  . Plavix [Clopidogrel Bisulfate] Other (See Comments)    RASH  . Sulfur Other (See Comments)    GI upset      Medication List       Accurate as of 12/05/15 11:59 PM. Always use your most recent med list.          ACT DRY MOUTH Lozg Use as directed in the mouth or throat as needed (for dry mouth).   aspirin 81 MG chewable tablet Chew 81 mg by mouth daily.     calcium-vitamin D 500-200 MG-UNIT tablet Commonly known as:  OSCAL WITH D Take 1 tablet by mouth 3 (three) times daily.   CERTAVITE/ANTIOXIDANTS PO Take 1 tablet by mouth daily.   cetirizine 10 MG tablet Commonly known as:  ZYRTEC Take 10 mg by mouth daily. For depression   Cranberry 200 MG Caps Take 2 capsules by mouth 2 (two) times daily. UTI Prophylaxis   diclofenac sodium 1 % Gel Commonly known as:  VOLTAREN Apply 2 g topically 4 (four) times daily. To left shoulder   ELIQUIS 5 MG Tabs tablet Generic drug:  apixaban Take 5 mg by mouth 2 (two) times daily. For DVT   esomeprazole 40 MG capsule Commonly known as:  NEXIUM Take 40 mg by mouth 2 (two) times daily before a meal. Take one capsule by mouth twice daily for stomach   ferrous sulfate 325 (65 FE) MG tablet Take 325 mg by mouth 3 (three) times daily with meals.   Fish Oil 1200 MG Caps Take 1,200 mg by mouth daily.   HYDROcodone-acetaminophen 5-325 MG tablet Commonly known as:  NORCO/VICODIN Take one tablet by mouth twice daily for pain. Do not exceed 4gm of Tylenol in 24 hours   ipratropium 0.03 % nasal spray Commonly known as:  ATROVENT Place 2 sprays into both nostrils daily as needed (For asthma).   ipratropium-albuterol 0.5-2.5 (3) MG/3ML Soln Commonly known as:  DUONEB Take 3 mLs by nebulization every 8 (eight) hours as needed. For shortness of breath   levothyroxine 50 MCG tablet Commonly known as:  SYNTHROID, LEVOTHROID Take 50 mcg by mouth daily.   lidocaine 5 % Commonly known as:  LIDODERM Place 1 patch onto the skin daily. Remove & Discard patch within 12 hours or as directed by MD   LORazepam 0.5 MG tablet Commonly known as:  ATIVAN Take one tablet by mouth every night at bedtime for anxiety   methocarbamol 500 MG tablet Commonly known as:  ROBAXIN Take 500 mg by mouth at bedtime. For muscle spasms/shoulder pain.   metoprolol 50 MG tablet Commonly known as:  LOPRESSOR Take 50 mg by  mouth 2 (two) times daily.   montelukast 10 MG tablet Commonly known as:  SINGULAIR Take 10 mg by mouth at bedtime.   ondansetron 4 MG tablet Commonly known as:  ZOFRAN Take 4 mg by mouth every 6 (six) hours as needed for nausea or vomiting.   OXYGEN Inhale 2 L into the lungs.   PATADAY 0.2 % Soln Generic drug:  Olopatadine HCl Place 1 drop into both eyes daily.   predniSONE 10 MG tablet Commonly known as:  DELTASONE Take  10 mg by mouth daily. For RA   promethazine 25 MG tablet Commonly known as:  PHENERGAN Take 25 mg by mouth 2 (two) times daily as needed for nausea.   rosuvastatin 20 MG tablet Commonly known as:  CRESTOR Take 20 mg by mouth every evening. For hyperlipidemia   sertraline 50 MG tablet Commonly known as:  ZOLOFT Take 50 mg by mouth every morning. For depression   Vitamin D 2000 units Caps Take 1 capsule (2,000 Units total) by mouth daily.       Review of Systems  Constitutional: Negative for activity change, appetite change, chills, diaphoresis, fatigue and fever.  HENT: Negative for congestion and sore throat.   Eyes: Negative.   Respiratory: Negative.   Cardiovascular: Negative for chest pain, palpitations and leg swelling.  Gastrointestinal: Negative for abdominal distention, abdominal pain, blood in stool, constipation, diarrhea, nausea and vomiting.  Endocrine: Negative.   Genitourinary: Negative for decreased urine volume, difficulty urinating, dyspareunia, dysuria, enuresis, flank pain, frequency, hematuria and urgency.  Musculoskeletal: Positive for gait problem. Negative for back pain and myalgias.  Skin: Negative.   Allergic/Immunologic: Negative.   Hematological: Negative.   Psychiatric/Behavioral: Negative.     Immunization History  Administered Date(s) Administered  . Influenza-Unspecified 05/09/2013, 04/27/2014, 04/17/2015, 09/17/2015  . PPD Test 12/22/2012, 03/14/2014, 03/08/2015  . Pneumococcal-Unspecified 05/09/2013,  09/09/2013   Pertinent  Health Maintenance Due  Topic Date Due  . INFLUENZA VACCINE  06/30/2017 (Originally 01/29/2016)  . DEXA SCAN  Completed  . PNA vac Low Risk Adult  Addressed   Fall Risk  10/04/2014  Falls in the past year? Yes  Number falls in past yr: 2 or more  Injury with Fall? No  Risk Factor Category  High Fall Risk  Risk for fall due to : Impaired balance/gait;Impaired mobility;Medication side effect;History of fall(s)  Follow up Education provided;Falls evaluation completed;Falls prevention discussed      Vitals:   12/05/15 1818  BP: (!) 151/73  Pulse: 78  Resp: 18  Temp: 98 F (36.7 C)  SpO2: 98%  Weight: 142 lb 12.8 oz (64.8 kg)  Height: 5\' 2"  (1.575 m)   Body mass index is 26.12 kg/m. Physical Exam  Constitutional: She is oriented to person, place, and time. She appears well-developed and well-nourished. No distress.  HENT:  Head: Normocephalic and atraumatic.  Mouth/Throat: Oropharynx is clear and moist.  Eyes: Conjunctivae and EOM are normal. Pupils are equal, round, and reactive to light. Right eye exhibits no discharge. Left eye exhibits no discharge. No scleral icterus.  Neck: Normal range of motion. No JVD present. No thyromegaly present.  Cardiovascular: Normal rate, regular rhythm, normal heart sounds and intact distal pulses.  Exam reveals no gallop and no friction rub.   No murmur heard. Pulmonary/Chest: Effort normal and breath sounds normal. No respiratory distress. She has no wheezes. She has no rales.  Abdominal: Soft. Bowel sounds are normal. She exhibits no distension. There is no tenderness. There is no rebound and no guarding.  Musculoskeletal: She exhibits no edema or tenderness.  Slight limited ROM to UE/Knees. Abnormal gait   Lymphadenopathy:    She has no cervical adenopathy.  Neurological: She is oriented to person, place, and time.  Skin: Skin is warm and dry. No rash noted. No erythema.  Psychiatric: She has a normal mood and  affect.    Labs reviewed:  Recent Labs  07/24/15 1122 07/25/15 0706 07/26/15 0655 08/29/15 12/04/15 01/11/16  NA 143 141 139 144 145 147  K 3.0* 3.4* 3.3* 4.5 3.9 5.0  CL 107 109 108  --   --   --   CO2 21* 21* 19*  --   --   --   GLUCOSE 132* 83 87  --   --   --   BUN 20 18 12 21  29* 20  CREATININE 1.54* 1.52* 1.38* 1.3* 1.4* 1.2*  CALCIUM 8.5* 8.4* 8.1*  --   --   --   MG  --  1.4*  --   --   --   --     Recent Labs  07/22/15 1123 07/23/15 0226 08/29/15  AST 23 29 19   ALT 15 13* 16  ALKPHOS 66 46 44  BILITOT 1.1 0.8  --   PROT 6.3* 4.9*  --   ALBUMIN 2.9* 2.1*  --     Recent Labs  06/28/15 1555 07/22/15 1123  07/24/15 1122 07/25/15 0706 07/26/15 0655  12/04/15  01/24/16 02/11/16 02/21/16  WBC 13.9* 27.8*  < > 18.1* 17.5* 13.4*  < > 17.5  < > 19.0 9.4 10.7  NEUTROABS 12.5* 24.0*  --   --   --   --   --  13  --   --   --   --   HGB 12.4 13.0  < > 10.0* 10.5* 10.4*  < > 9.8*  < > 11.3* 9.4* 8.9*  HCT 38.2 39.7  < > 31.0* 32.6* 32.0*  < > 34*  < > 36 30* 29*  MCV 87.5 88.6  < > 88.8 89.6 87.9  --   --   --   --   --   --   PLT 173.0 116*  < > 204 128* 131*  < > 257  < > 184 174 183  < > = values in this interval not displayed. Lab Results  Component Value Date   TSH 0.53 03/04/2016   Lab Results  Component Value Date   HGBA1C 6.4 01/11/2016   Lab Results  Component Value Date   CHOL 109 01/11/2016   HDL 37 01/11/2016   LDLCALC 39 01/11/2016   LDLDIRECT 60.7 11/13/2010   TRIG 167 (A) 01/11/2016   CHOLHDL 2.9 12/02/2011   Assessment/Plan 1. Leukocytosis WBC 17.5 ( 12/04/2015). Afebrile. Recently received Amoxicillin 500 mg  4 Tablets prior to her dental cleaning. Will obtain Urine specimen for U/A and C/S rule out UTI. Portable CXR Pa/Lat rule out PNA.Monitor Vital signs every shift X 1 week then resume previous orders. Monitor CBC 12/07/2015  2. Iron deficiency anemia  Hgb 9.8 ( 12/04/2015). Previous Hgb 9.1 continue on Ferrous sulfate 325 mg Tablet three  times daily. Monitor CBC 12/07/2015.   Family/ staff Communication: Reviewed plan of care with patient and facility Nurse supervisor.   Labs/tests ordered: Monitor CBC 12/07/2015

## 2016-05-27 ENCOUNTER — Encounter: Payer: Self-pay | Admitting: Family

## 2016-05-27 ENCOUNTER — Non-Acute Institutional Stay (SKILLED_NURSING_FACILITY): Payer: Medicare Other | Admitting: Family

## 2016-05-27 DIAGNOSIS — I130 Hypertensive heart and chronic kidney disease with heart failure and stage 1 through stage 4 chronic kidney disease, or unspecified chronic kidney disease: Secondary | ICD-10-CM | POA: Diagnosis not present

## 2016-05-27 DIAGNOSIS — N183 Chronic kidney disease, stage 3 unspecified: Secondary | ICD-10-CM

## 2016-05-27 DIAGNOSIS — E782 Mixed hyperlipidemia: Secondary | ICD-10-CM | POA: Diagnosis not present

## 2016-05-27 DIAGNOSIS — M069 Rheumatoid arthritis, unspecified: Secondary | ICD-10-CM | POA: Diagnosis not present

## 2016-05-27 DIAGNOSIS — I5030 Unspecified diastolic (congestive) heart failure: Secondary | ICD-10-CM

## 2016-05-27 DIAGNOSIS — E039 Hypothyroidism, unspecified: Secondary | ICD-10-CM | POA: Diagnosis not present

## 2016-05-27 DIAGNOSIS — I5032 Chronic diastolic (congestive) heart failure: Secondary | ICD-10-CM

## 2016-05-27 NOTE — Progress Notes (Signed)
Location:  [facility] Room Number: 904-A Place of Service:  SNF (31) Provider:  Dinah Ngetich FNP-C   [name], MD  Patient Care Team: [name], MD as PCP - General (Internal Medicine) [name], NP as Nurse Practitioner (Nurse Practitioner)  Extended Emergency Contact Information Primary Emergency Contact: Porcaro,[name] Address: [id number] [place], [place] Phone: [phone] Mobile Phone: [phone] Relation: Son Secondary Emergency Contact: [name] Phone: [phone] Mobile Phone: [phone] Relation: Daughter  Code Status:  Full Code  Goals of care: Advanced Directive information Advanced Directives 03/24/2016  Does Patient Have a Medical Advance Directive? No  Type of Advance Directive -  Does patient want to make changes to medical advance directive? -  Copy of [place] in Chart? -  Would patient like information on creating a medical advance directive? -  Pre-existing out of facility DNR order (yellow form or pink MOST form) -     Chief Complaint  Patient presents with  . Medical Management of Chronic Issues    Routine Visit    HPI:  Pt is a 82 y.o. female seen today at [facility] for medical management of chronic diseases.She has a medical history of HTN, CHF,CVA Hyperlipidemia, CAD, DVT, RA CKD stage 3 among other conditions.She is seen in her room today with her daughter at bedside. She denies any acute issues this visit. Patient's daughter states no concerns. She is currently on Macrobid for UTI Prophylaxis. She has had no recent fall episodes, weight changes or hospital admission. Facility staff reports no new concerns.      Past Medical History:  Diagnosis Date  . Anxiety   . Arthritis   . Chronic diastolic CHF (congestive heart failure) ([place])    a. 10/2014 Echo: EF 60-65%, mild LVH, grade 1  DD, dynamic obstruction @ rest - peak velocity of 271 cm/sec, peak grad of 32mmHg, PASP 59mmHg.  Marland Kitchen Coronary artery disease    a. 2012 s/p NSTEMI/Cath: 95% apical LAD dzs, otw nonobs dzs-->Med Rx;  b. 05/2011 MV: no ischemia, EF 79%, inf infarct- attenuation.  . CVA (cerebral vascular accident) ([place])   . Depression   . DVT of lower extremity (deep venous thrombosis) ([place])   . Dyspnea   . Elevated troponin    a. 10/2014 in setting of sepsis/?HCAP.  Marland Kitchen Enteritis due to Clostridium difficile 01/04/2013  . Essential hypertension   . Fall   . Fatigue   . GERD (gastroesophageal reflux disease)   . GI bleed 2005  . HCAP (healthcare-associated pneumonia) 01/22/2013  . Hemorrhoids   . Hiatal hernia   . Hyperlipidemia   . Hypothyroidism   . Malnutrition ([place])   . Obstructive hypertrophic cardiomyopathy ([place])    a. 10/2014 Echo: EF 60-65%, mild LVH, grade 1 DD, dynamic obstruction @ rest - peak velocity of 271 cm/sec, peak grad of 6mmHg, PASP 35mmHg.  . Osteoporosis   . PUD (peptic ulcer disease)   . RA (rheumatoid arthritis) ([place])   . Recurrent colitis due to Clostridium difficile 06/02/2012  . Renal insufficiency   . Right ear pain   . Sepsis ([place]) 12/19/2012  . TIA (transient ischemic attack)   . Weight loss    Past Surgical History:  Procedure Laterality Date  . ANKLE SURGERY    . BACK SURGERY    . BREAST SURGERY  1964   x3  . CARDIAC CATHETERIZATION     SHOWED RUPTURE PLAQUE IN THE LAD. THE LAD IS NONOBSTRUCTIVE WITH ONLY 30-40% STENOSIS  . COLONOSCOPY    . KNEE SURGERY    . NSTEMI  06/2010  . SPINAL FUSION SURGERY      Allergies  Allergen Reactions  . Biphosphate   . Codeine Swelling  . Morphine And Related Other (See Comments)    unknown  . Percocet [Oxycodone-Acetaminophen]     unknown  . Plavix [Clopidogrel Bisulfate] Other (See Comments)    RASH  . Sulfur Other (See Comments)    GI upset      Medication List       Accurate as of 05/27/16 10:28 AM. Always use  your most recent med list.          ACT DRY MOUTH Lozg Use as directed in the mouth or throat as needed (for dry mouth).   aspirin 81 MG chewable tablet Chew 81 mg by mouth daily.   BIOFREEZE 4 % Gel Generic drug:  Menthol (Topical Analgesic) Apply 1 application topically every 8 (eight) hours.   calcium-vitamin D 500-200 MG-UNIT tablet Commonly known as:  OSCAL WITH D Take 1 tablet by mouth 3 (three) times daily.   carboxymethylcellulose 1 % ophthalmic solution Apply 1 drop to eye 3 (three) times daily.   CERTAVITE/ANTIOXIDANTS PO Take 1 tablet by mouth daily.   cetirizine 10 MG tablet Commonly known as:  ZYRTEC Take 10 mg by mouth daily. For depression   Cranberry 200 MG Caps Take 2 capsules by mouth 2 (two) times daily. UTI Prophylaxis   denosumab 60 MG/ML Soln injection Commonly known as:  PROLIA Inject 60 mg into the skin every 6 (six) months. Administer in upper arm, thigh, or abdomen   diclofenac sodium 1 % Gel Commonly known as:  VOLTAREN Apply 2 g topically every 6 (six) hours. Apply to left shoulder   ELIQUIS 5 MG Tabs tablet Generic drug:  apixaban Take 5 mg by mouth 2 (two) times daily. For DVT   esomeprazole 40 MG capsule Commonly known as:  NEXIUM Take 40 mg by mouth 2 (two) times daily before a meal. Take one capsule by mouth twice daily for stomach   ferrous sulfate 325 (65 FE) MG tablet Take 325 mg by mouth 3 (three) times daily with meals.   Fish Oil 1200 MG Caps Take 1,200 mg by mouth daily.   hydrochlorothiazide 12.5 MG tablet Commonly known as:  HYDRODIURIL Take 1 tablet (12.5 mg total) by mouth daily.   HYDROcodone-acetaminophen 5-325 MG tablet Commonly known as:  NORCO/VICODIN Take 1 tablet by mouth every 8 (eight) hours as needed for moderate pain.   HYDROcodone-acetaminophen 5-325 MG tablet Commonly known as:  NORCO/VICODIN Take one tablet by mouth twice daily for pain. Do not exceed 4gm of Tylenol in 24 hours   ipratropium  0.03 % nasal spray Commonly known as:  ATROVENT Place 2 sprays into both nostrils daily as needed (For asthma).   ipratropium-albuterol 0.5-2.5 (3) MG/3ML Soln Commonly known as:  DUONEB Take 3 mLs by nebulization every 8 (eight) hours as needed. For shortness of breath   levothyroxine 50 MCG tablet Commonly known as:  SYNTHROID, LEVOTHROID Take 50 mcg by mouth daily.   lidocaine 5 % Commonly known as:  LIDODERM Place 1 patch onto the skin daily. Remove & Discard patch within 12 hours or as directed by MD   LORazepam 0.5 MG tablet Commonly known as:  ATIVAN Take one tablet by mouth every night at bedtime for anxiety   meclizine 12.5 MG tablet Commonly known as:  ANTIVERT Take 12.5 mg by mouth 2 (two) times daily.   methocarbamol 500 MG tablet Commonly known as:  ROBAXIN Take 500 mg by mouth at bedtime. For muscle spasms/shoulder pain.   metoprolol 50 MG tablet Commonly known as:  LOPRESSOR Take 50 mg by mouth 2 (two) times daily.   montelukast 10 MG tablet Commonly known as:  SINGULAIR Take 10 mg by mouth at bedtime.   nitrofurantoin (macrocrystal-monohydrate) 100 MG capsule Commonly known as:  MACROBID Take 100 mg by mouth daily.   nitroGLYCERIN 0.4 MG SL tablet Commonly known as:  NITROSTAT Place 0.4 mg under the tongue every 5 (five) minutes as needed for chest pain.   ondansetron 4 MG tablet Commonly known as:  ZOFRAN Take 4 mg by mouth every 6 (six) hours as needed for nausea or vomiting.   OXYGEN Inhale 2 L into the lungs.   PATADAY 0.2 % Soln Generic drug:  Olopatadine HCl Place 1 drop into both eyes daily.   predniSONE 10 MG tablet Commonly known as:  DELTASONE Take 10 mg by mouth daily. For RA   promethazine 25 MG tablet Commonly known as:  PHENERGAN Take 25 mg by mouth every 12 (twelve) hours as needed for nausea or vomiting.   rosuvastatin 20 MG tablet Commonly known as:  CRESTOR Take 20 mg by mouth every evening. For hyperlipidemia     sertraline 50 MG tablet Commonly known as:  ZOLOFT Take 50 mg by mouth every morning. For depression   Vitamin D 2000 units Caps Take 1 capsule (2,000 Units total) by mouth daily.       Review of Systems  Constitutional: Negative for activity change, appetite change, chills, fatigue and fever.  HENT: Negative for congestion, rhinorrhea, sinus pressure, sneezing and sore throat.   Eyes: Negative.   Respiratory: Negative for cough, chest tightness, shortness of breath and wheezing.   Cardiovascular: Negative for chest pain, palpitations and leg swelling.  Gastrointestinal: Negative for abdominal distention, abdominal pain, constipation, diarrhea, nausea and vomiting.  Endocrine: Negative.   Genitourinary: Negative for difficulty urinating, dysuria, flank pain, frequency, urgency, vaginal discharge and vaginal pain.  Musculoskeletal: Positive for gait problem.  Skin: Negative.   Neurological: Negative for dizziness, seizures, light-headedness and headaches.  Hematological: Does not bruise/bleed easily.  Psychiatric/Behavioral: Negative for agitation, confusion, hallucinations and sleep disturbance. The patient is not nervous/anxious.     Immunization History  Administered Date(s) Administered  . Influenza-Unspecified 05/09/2013, 04/27/2014, 04/17/2015, 09/17/2015  . PPD Test 12/22/2012, 03/14/2014, 03/08/2015  . Pneumococcal-Unspecified 05/09/2013, 09/09/2013   Pertinent  Health Maintenance Due  Topic Date Due  . INFLUENZA VACCINE  06/30/2017 (Originally 01/29/2016)  . DEXA SCAN  Completed  . PNA vac Low Risk Adult  Addressed   Fall Risk  10/04/2014  Falls in the past year? Yes  Number falls in past yr: 2 or more  Injury with Fall? No  Risk Factor Category  High Fall Risk  Risk for fall due to : Impaired balance/gait;Impaired mobility;Medication side effect;History of fall(s)  Follow up Education provided;Falls evaluation completed;Falls prevention discussed      Vitals:    05/27/16 1020  BP: 135/67  Pulse: 70  Resp: 18  Temp: 97.2 F (36.2 C)  TempSrc: Oral  SpO2: 95%  Weight: 135 lb 6.4 oz (61.4 kg)  Height: 5\' 6"  (1.676 m)   Body mass index is  21.85 kg/m. Physical Exam  Constitutional: She is oriented to person, place, and time. She appears well-developed and well-nourished.  Elderly in no acute distress  HENT:  Head: Normocephalic and atraumatic.  Mouth/Throat: Oropharynx is clear and moist.  Eyes: Conjunctivae and EOM are normal. Pupils are equal, round, and reactive to light. Right eye exhibits no discharge. Left eye exhibits no discharge. No scleral icterus.  Neck: Normal range of motion. No JVD present. No thyromegaly present.  Cardiovascular: Normal rate, regular rhythm, normal heart sounds and intact distal pulses.  Exam reveals no gallop and no friction rub.   No murmur heard. Pulmonary/Chest: Effort normal and breath sounds normal. No respiratory distress. She has no wheezes. She has no rales.  Abdominal: Soft. Bowel sounds are normal. She exhibits no distension. There is no tenderness. There is no rebound and no guarding.  Genitourinary: No vaginal discharge found.  Genitourinary Comments: Continent.   Musculoskeletal: She exhibits no edema, tenderness or deformity.  Moves x 4 extremities. Self propels on wheelchair   Lymphadenopathy:    She has no cervical adenopathy.  Neurological: She is oriented to person, place, and time.  Skin: Skin is warm and dry. No rash noted. No erythema.  Right great toe chronic small scab. Surrounding skin without any signs of infections.   Psychiatric: She has a normal mood and affect.    Labs reviewed:  Recent Labs  07/24/15 1122 07/25/15 0706 07/26/15 0655 08/29/15 12/04/15 01/11/16  NA 143 141 139 144 145 147  K 3.0* 3.4* 3.3* 4.5 3.9 5.0  CL 107 109 108  --   --   --   CO2 21* 21* 19*  --   --   --   GLUCOSE 132* 83 87  --   --   --   BUN 20 18 12 21  29* 20  CREATININE 1.54* 1.52* 1.38*  1.3* 1.4* 1.2*  CALCIUM 8.5* 8.4* 8.1*  --   --   --   MG  --  1.4*  --   --   --   --     Recent Labs  07/22/15 1123 07/23/15 0226 08/29/15  AST 23 29 19   ALT 15 13* 16  ALKPHOS 66 46 44  BILITOT 1.1 0.8  --   PROT 6.3* 4.9*  --   ALBUMIN 2.9* 2.1*  --     Recent Labs  06/28/15 1555 07/22/15 1123  07/24/15 1122 07/25/15 0706 07/26/15 0655  12/04/15  01/24/16 02/11/16 02/21/16  WBC 13.9* 27.8*  < > 18.1* 17.5* 13.4*  < > 17.5  < > 19.0 9.4 10.7  NEUTROABS 12.5* 24.0*  --   --   --   --   --  13  --   --   --   --   HGB 12.4 13.0  < > 10.0* 10.5* 10.4*  < > 9.8*  < > 11.3* 9.4* 8.9*  HCT 38.2 39.7  < > 31.0* 32.6* 32.0*  < > 34*  < > 36 30* 29*  MCV 87.5 88.6  < > 88.8 89.6 87.9  --   --   --   --   --   --   PLT 173.0 116*  < > 204 128* 131*  < > 257  < > 184 174 183  < > = values in this interval not displayed. Lab Results  Component Value Date   TSH 0.53 03/04/2016   Lab Results  Component Value Date   HGBA1C 6.4  01/11/2016   Lab Results  Component Value Date   CHOL 109 01/11/2016   HDL 37 01/11/2016   LDLCALC 39 01/11/2016   LDLDIRECT 60.7 11/13/2010   TRIG 167 (A) 01/11/2016   CHOLHDL 2.9 12/02/2011    Assessment/Plan HTN  B/p stable. Continue on HCZT and Metoprolol.   CHF No abrut weight gain. Exam findings negative. Continue on HCZT and Metoprolol.Monitor weight.   Hypothyroidism  Continue on Levothyroxine. Monitor TSH level.   RA Continue on Prednisone 10 mg tablet and Hydrocodone/APAP every 8 HRS PRN   Hyperlipidemia  Continue on Crestor. Monitor lipid panel    Family/ staff Communication: reviewed plan of care with patient, Patient's daughter and facility Nurse supervisor.   Labs/tests ordered: None

## 2016-06-04 DIAGNOSIS — D509 Iron deficiency anemia, unspecified: Secondary | ICD-10-CM | POA: Diagnosis not present

## 2016-06-09 ENCOUNTER — Other Ambulatory Visit: Payer: Self-pay | Admitting: *Deleted

## 2016-06-09 MED ORDER — HYDROCODONE-ACETAMINOPHEN 5-325 MG PO TABS: ORAL_TABLET | ORAL | 0 refills | Status: DC

## 2016-06-09 NOTE — Telephone Encounter (Signed)
[facility] [phone] Fax: [phone]  

## 2016-06-10 ENCOUNTER — Other Ambulatory Visit: Payer: Self-pay | Admitting: *Deleted

## 2016-06-10 MED ORDER — HYDROCODONE-ACETAMINOPHEN 5-325 MG PO TABS
ORAL_TABLET | ORAL | 0 refills | Status: DC
Start: 2016-06-10 — End: 2016-07-10

## 2016-06-10 NOTE — Telephone Encounter (Signed)
[facility] [phone] Fax: [phone]  

## 2016-06-24 ENCOUNTER — Non-Acute Institutional Stay (SKILLED_NURSING_FACILITY): Payer: Medicare Other | Admitting: Family

## 2016-06-24 ENCOUNTER — Encounter: Payer: Self-pay | Admitting: Family

## 2016-06-24 DIAGNOSIS — N183 Chronic kidney disease, stage 3 unspecified: Secondary | ICD-10-CM

## 2016-06-24 DIAGNOSIS — I5032 Chronic diastolic (congestive) heart failure: Secondary | ICD-10-CM | POA: Diagnosis not present

## 2016-06-24 DIAGNOSIS — R05 Cough: Secondary | ICD-10-CM | POA: Diagnosis not present

## 2016-06-24 DIAGNOSIS — E039 Hypothyroidism, unspecified: Secondary | ICD-10-CM | POA: Diagnosis not present

## 2016-06-24 DIAGNOSIS — I130 Hypertensive heart and chronic kidney disease with heart failure and stage 1 through stage 4 chronic kidney disease, or unspecified chronic kidney disease: Secondary | ICD-10-CM

## 2016-06-24 DIAGNOSIS — I5030 Unspecified diastolic (congestive) heart failure: Secondary | ICD-10-CM | POA: Diagnosis not present

## 2016-06-24 DIAGNOSIS — R059 Cough, unspecified: Secondary | ICD-10-CM

## 2016-06-24 NOTE — Progress Notes (Signed)
Location:  [facility] Room Number: 904-A Place of Service:  SNF (31) Provider:  Francine Hannan FNP-C   [name], MD  Patient Care Team: [name], MD as PCP - General (Internal Medicine) [name], NP as Nurse Practitioner (Nurse Practitioner)  Extended Emergency Contact Information Primary Emergency Contact: Fregia,[name] Address: [id number] [place], [place] Phone: [phone] Mobile Phone: [phone] Relation: Son Secondary Emergency Contact: [name] Phone: [phone] Mobile Phone: [phone] Relation: Daughter  Code Status:  Full Code  Goals of care: Advanced Directive information Advanced Directives 03/24/2016  Does Patient Have a Medical Advance Directive? No  Type of Advance Directive -  Does patient want to make changes to medical advance directive? -  Copy of [place] in Chart? -  Would patient like information on creating a medical advance directive? -  Pre-existing out of facility DNR order (yellow form or pink MOST form) -     Chief Complaint  Patient presents with  . Medical Management of Chronic Issues    HPI:  Pt is a 82 y.o. female seen today at [facility] for medical management of chronic diseases.She has a medical history of HTN, CHF,CKD stage 3, CVA, Hyperlipidemia, CAD, RA  among other conditions.She is seen in her room today. She complains of non-productive cough for the past four days.She denies any fever, chills or wheezing. She reports occasional shortness of breath. She has taken Robitussin with some relief. She continues on Macrobid for UTI Prophylaxis. She has had no fall episodes, weight loss or hospital admission since previous visit. Facility staff reports no new concerns.      Past Medical History:  Diagnosis Date  . Anxiety   . Arthritis   . Chronic diastolic CHF (congestive  heart failure) ([place])    a. 10/2014 Echo: EF 60-65%, mild LVH, grade 1 DD, dynamic obstruction @ rest - peak velocity of 271 cm/sec, peak grad of 33mmHg, PASP 73mmHg.  [place] Kitchen Coronary artery disease    a. 2012 s/p NSTEMI/Cath: 95% apical LAD dzs, otw nonobs dzs-->Med Rx;  b. 05/2011 MV: no ischemia, EF 79%, inf infarct- attenuation.  . CVA (cerebral vascular accident) ([place])   . Depression   . DVT of lower extremity (deep venous thrombosis) ([place])   . Dyspnea   . Elevated troponin    a. 10/2014 in setting of sepsis/?HCAP.  [place] Kitchen Enteritis due to Clostridium difficile 01/04/2013  . Essential hypertension   . Fall   . Fatigue   . GERD (gastroesophageal reflux disease)   . GI bleed 2005  . HCAP (healthcare-associated pneumonia) 01/22/2013  . Hemorrhoids   . Hiatal hernia   . Hyperlipidemia   . Hypothyroidism   . Malnutrition ([place])   . Obstructive hypertrophic cardiomyopathy ([place])    a. 10/2014 Echo: EF 60-65%, mild LVH, grade 1 DD, dynamic obstruction @ rest - peak velocity of 271 cm/sec, peak grad of 52mmHg, PASP 49mmHg.  . Osteoporosis   . PUD (peptic ulcer disease)   . RA (rheumatoid arthritis) ([place])   . Recurrent colitis due to Clostridium difficile 06/02/2012  . Renal insufficiency   . Right ear pain   . Sepsis ([place]) 12/19/2012  . TIA (transient ischemic attack)   . Weight loss    Past Surgical History:  Procedure Laterality Date  . ANKLE SURGERY    .  BACK SURGERY    . [place]   x3  . CARDIAC CATHETERIZATION     SHOWED RUPTURE PLAQUE IN THE LAD. THE LAD IS NONOBSTRUCTIVE WITH ONLY 30-40% STENOSIS  . COLONOSCOPY    . KNEE SURGERY    . NSTEMI  06/2010  . SPINAL FUSION SURGERY      Allergies  Allergen Reactions  . Biphosphate   . Codeine Swelling  . Morphine And Related Other (See Comments)    unknown  . Percocet [Oxycodone-Acetaminophen]     unknown  . Plavix [Clopidogrel Bisulfate] Other (See Comments)    RASH  . Sulfur Other (See Comments)    GI upset     Allergies as of 06/24/2016      Reactions   Biphosphate    Codeine Swelling   Morphine And Related Other (See Comments)   unknown   Percocet [oxycodone-acetaminophen]    unknown   Plavix [clopidogrel Bisulfate] Other (See Comments)   RASH   Sulfur Other (See Comments)   GI upset      Medication List       Accurate as of 06/24/16  5:53 PM. Always use your most recent med list.          ACT DRY MOUTH Lozg Use as directed in the mouth or throat as needed (for dry mouth).   aspirin 81 MG chewable tablet Chew 81 mg by mouth daily.   BIOFREEZE 4 % Gel Generic drug:  Menthol (Topical Analgesic) Apply 1 application topically every 8 (eight) hours.   calcium-vitamin D 500-200 MG-UNIT tablet Commonly known as:  OSCAL WITH D Take 1 tablet by mouth 3 (three) times daily.   carboxymethylcellulose 1 % ophthalmic solution Apply 1 drop to eye 3 (three) times daily. Both eyes   CERTAVITE/ANTIOXIDANTS PO Take 1 tablet by mouth daily.   cetirizine 10 MG tablet Commonly known as:  ZYRTEC Take 10 mg by mouth daily.   Cranberry 200 MG Caps Take 2 capsules by mouth 2 (two) times daily. 2 tablets to = 400 mg BID   denosumab 60 MG/ML Soln injection Commonly known as:  PROLIA Inject 60 mg into the skin every 6 (six) months. Administer in upper arm, thigh, or abdomen   diclofenac sodium 1 % Gel Commonly known as:  VOLTAREN Apply 2 g topically every 6 (six) hours. Apply to left shoulder   ELIQUIS 5 MG Tabs tablet Generic drug:  apixaban Take 5 mg by mouth 2 (two) times daily. For DVT   esomeprazole 40 MG capsule Commonly known as:  NEXIUM Take 40 mg by mouth 2 (two) times daily before a meal. Take one capsule by mouth twice daily for stomach   ferrous sulfate 325 (65 FE) MG tablet Take 325 mg by mouth 3 (three) times daily with meals.   Fish Oil 1200 MG Caps Take 1,200 mg by mouth daily.   hydrochlorothiazide 12.5 MG tablet Commonly known as:  HYDRODIURIL Take 1  tablet (12.5 mg total) by mouth daily.   HYDROcodone-acetaminophen 5-325 MG tablet Commonly known as:  NORCO/VICODIN Take 1 tablet by mouth every 8 (eight) hours as needed for moderate pain.   HYDROcodone-acetaminophen 5-325 MG tablet Commonly known as:  NORCO/VICODIN Take one tablet by mouth twice daily for pain. Do not exceed 3gm of Tylenol in 24 hours   ipratropium 0.03 % nasal spray Commonly known as:  ATROVENT Place 2 sprays into both nostrils daily as needed (For asthma).   ipratropium-albuterol 0.5-2.5 (3) MG/3ML Soln  Commonly known as:  DUONEB Take 3 mLs by nebulization every 8 (eight) hours as needed. For shortness of breath   levothyroxine 50 MCG tablet Commonly known as:  SYNTHROID, LEVOTHROID Take 50 mcg by mouth daily.   lidocaine 5 % Commonly known as:  LIDODERM Place 1 patch onto the skin daily. Remove & Discard patch within 12 hours or as directed by MD   LORazepam 0.5 MG tablet Commonly known as:  ATIVAN Take one tablet by mouth every night at bedtime for anxiety   meclizine 12.5 MG tablet Commonly known as:  ANTIVERT Take 12.5 mg by mouth 2 (two) times daily.   methocarbamol 500 MG tablet Commonly known as:  ROBAXIN Take 500 mg by mouth at bedtime. For muscle spasms/shoulder pain.   metoprolol 50 MG tablet Commonly known as:  LOPRESSOR Take 50 mg by mouth 2 (two) times daily.   montelukast 10 MG tablet Commonly known as:  SINGULAIR Take 10 mg by mouth at bedtime.   nitrofurantoin (macrocrystal-monohydrate) 100 MG capsule Commonly known as:  MACROBID Take 100 mg by mouth daily.   nitroGLYCERIN 0.4 MG SL tablet Commonly known as:  NITROSTAT Place 0.4 mg under the tongue every 5 (five) minutes as needed for chest pain.   ondansetron 4 MG tablet Commonly known as:  ZOFRAN Take 4 mg by mouth every 6 (six) hours as needed for nausea or vomiting.   OXYGEN Inhale 2 L into the lungs as needed (To maintain saturations >90%).   PATADAY 0.2 %  Soln Generic drug:  Olopatadine HCl Place 1 drop into both eyes daily.   predniSONE 10 MG tablet Commonly known as:  DELTASONE Take 10 mg by mouth daily. For RA   promethazine 25 MG tablet Commonly known as:  PHENERGAN Take 25 mg by mouth every 12 (twelve) hours as needed for nausea or vomiting.   rosuvastatin 20 MG tablet Commonly known as:  CRESTOR Take 20 mg by mouth every evening. For hyperlipidemia   sertraline 50 MG tablet Commonly known as:  ZOLOFT Take 50 mg by mouth every morning. For depression   Vitamin D 2000 units Caps Take 1 capsule (2,000 Units total) by mouth daily.       Review of Systems  Constitutional: Negative for activity change, appetite change, chills, fatigue and fever.  HENT: Negative for congestion, rhinorrhea, sinus pressure, sneezing and sore throat.   Eyes: Negative.   Respiratory: Positive for cough. Negative for chest tightness and wheezing.        Shortness of breath sometimes.   Cardiovascular: Negative for chest pain, palpitations and leg swelling.  Gastrointestinal: Negative for abdominal distention, abdominal pain, constipation, diarrhea, nausea and vomiting.  Endocrine: Negative for cold intolerance, heat intolerance, polydipsia, polyphagia and polyuria.  Genitourinary: Negative for difficulty urinating, dysuria, flank pain, frequency, urgency, vaginal discharge and vaginal pain.  Musculoskeletal: Positive for gait problem.  Skin: Negative for color change, pallor and rash.  Neurological: Negative for dizziness, seizures, light-headedness and headaches.  Hematological: Does not bruise/bleed easily.  Psychiatric/Behavioral: Negative for agitation, confusion, hallucinations and sleep disturbance. The patient is not nervous/anxious.     Immunization History  Administered Date(s) Administered  . Influenza-Unspecified 05/09/2013, 04/27/2014, 04/17/2015, 09/17/2015  . PPD Test 12/22/2012, 03/14/2014, 03/08/2015  . Pneumococcal-Unspecified  05/09/2013, 09/09/2013   Pertinent  Health Maintenance Due  Topic Date Due  . INFLUENZA VACCINE  06/30/2017 (Originally 01/29/2016)  . DEXA SCAN  Completed  . PNA vac Low Risk Adult  Addressed   Fall Risk  10/04/2014  Falls in the past year? Yes  Number falls in past yr: 2 or more  Injury with Fall? No  Risk Factor Category  High Fall Risk  Risk for fall due to : Impaired balance/gait;Impaired mobility;Medication side effect;History of fall(s)  Follow up Education provided;Falls evaluation completed;Falls prevention discussed      Vitals:   06/24/16 1203  BP: 137/79  Pulse: 84  Resp: 16  Temp: 98.3 F (36.8 C)  TempSrc: Oral  SpO2: 97%  Weight: 135 lb 5.8 oz (61.4 kg)  Height: 5\' 6"  (1.676 m)   Body mass index is 21.85 kg/m. Physical Exam  Constitutional: She is oriented to person, place, and time. She appears well-developed and well-nourished.  Elderly in no acute distress  HENT:  Head: Normocephalic and atraumatic.  Mouth/Throat: Oropharynx is clear and moist.  Eyes: Conjunctivae and EOM are normal. Pupils are equal, round, and reactive to light. Right eye exhibits no discharge. Left eye exhibits no discharge. No scleral icterus.  Neck: Normal range of motion. No JVD present. No thyromegaly present.  Cardiovascular: Normal rate, regular rhythm, normal heart sounds and intact distal pulses.  Exam reveals no gallop and no friction rub.   No murmur heard. Pulmonary/Chest: Effort normal. No respiratory distress. She has no wheezes. She has no rales.  Diminished breath sounds.   Abdominal: Soft. Bowel sounds are normal. She exhibits no distension. There is no tenderness. There is no rebound and no guarding.  Genitourinary: No vaginal discharge found.  Genitourinary Comments: Continent.   Musculoskeletal: She exhibits no edema, tenderness or deformity.  Moves x 4 extremities.Unsteady gait.   Lymphadenopathy:    She has no cervical adenopathy.  Neurological: She is oriented  to person, place, and time.  Skin: Skin is warm and dry. No rash noted. No erythema.  Right great toe chronic small scab without any signs of infections.   Psychiatric: She has a normal mood and affect.    Labs reviewed:  Recent Labs  07/24/15 1122 07/25/15 0706 07/26/15 0655 08/29/15 12/04/15 01/11/16  NA 143 141 139 144 145 147  K 3.0* 3.4* 3.3* 4.5 3.9 5.0  CL 107 109 108  --   --   --   CO2 21* 21* 19*  --   --   --   GLUCOSE 132* 83 87  --   --   --   BUN 20 18 12 21  29* 20  CREATININE 1.54* 1.52* 1.38* 1.3* 1.4* 1.2*  CALCIUM 8.5* 8.4* 8.1*  --   --   --   MG  --  1.4*  --   --   --   --     Recent Labs  07/22/15 1123 07/23/15 0226 08/29/15  AST 23 29 19   ALT 15 13* 16  ALKPHOS 66 46 44  BILITOT 1.1 0.8  --   PROT 6.3* 4.9*  --   ALBUMIN 2.9* 2.1*  --     Recent Labs  06/28/15 1555 07/22/15 1123  07/24/15 1122 07/25/15 0706 07/26/15 0655  12/04/15  01/24/16 02/11/16 02/21/16  WBC 13.9* 27.8*  < > 18.1* 17.5* 13.4*  < > 17.5  < > 19.0 9.4 10.7  NEUTROABS 12.5* 24.0*  --   --   --   --   --  13  --   --   --   --   HGB 12.4 13.0  < > 10.0* 10.5* 10.4*  < > 9.8*  < > 11.3* 9.4* 8.9*  HCT 38.2 39.7  < > 31.0* 32.6* 32.0*  < > 34*  < > 36 30* 29*  MCV 87.5 88.6  < > 88.8 89.6 87.9  --   --   --   --   --   --   PLT 173.0 116*  < > 204 128* 131*  < > 257  < > 184 174 183  < > = values in this interval not displayed. Lab Results  Component Value Date   TSH 0.53 03/04/2016   Lab Results  Component Value Date   HGBA1C 6.4 01/11/2016   Lab Results  Component Value Date   CHOL 109 01/11/2016   HDL 37 01/11/2016   LDLCALC 39 01/11/2016   LDLDIRECT 60.7 11/13/2010   TRIG 167 (A) 01/11/2016   CHOLHDL 2.9 12/02/2011    Assessment/Plan  Hypothyroidism  Continue on Levothyroxine. TSH level 06/25/2016.   HTN  B/p stable. Continue on HCZT and Metoprolol. BMP 06/25/2016  CHF No abrut weight gain. No edema.Shortness of breath sometimes.Continue on HCZT and  Metoprolol. Monitor weight daily.   Cough  Non-productive. Afebrile. Shortness of breath sometimes. Obtain CXR Pa/Lat rule out PNA.    Family/ staff Communication: reviewed plan of care with patient, Patient's daughter and facility Nurse supervisor.   Labs/tests ordered: CBC/diff, BMP, TSH level 06/25/2016

## 2016-06-25 DIAGNOSIS — R05 Cough: Secondary | ICD-10-CM | POA: Diagnosis not present

## 2016-06-25 DIAGNOSIS — E039 Hypothyroidism, unspecified: Secondary | ICD-10-CM | POA: Diagnosis not present

## 2016-06-25 DIAGNOSIS — D508 Other iron deficiency anemias: Secondary | ICD-10-CM | POA: Diagnosis not present

## 2016-06-25 LAB — BASIC METABOLIC PANEL
BUN: 28 mg/dL — AB (ref 4–21)
Creatinine: 1.2 mg/dL — AB (ref 0.5–1.1)
GLUCOSE: 82 mg/dL
Potassium: 4.5 mmol/L (ref 3.4–5.3)
Sodium: 146 mmol/L (ref 137–147)

## 2016-06-25 LAB — CBC AND DIFFERENTIAL
HEMATOCRIT: 33 % — AB (ref 36–46)
Hemoglobin: 10.2 g/dL — AB (ref 12.0–16.0)
Platelets: 144 10*3/uL — AB (ref 150–399)
WBC: 11.1 10*3/mL

## 2016-06-25 LAB — TSH: TSH: 0.45 u[IU]/mL (ref 0.41–5.90)

## 2016-06-27 DIAGNOSIS — R300 Dysuria: Secondary | ICD-10-CM | POA: Diagnosis not present

## 2016-06-28 DIAGNOSIS — R05 Cough: Secondary | ICD-10-CM | POA: Diagnosis not present

## 2016-06-28 DIAGNOSIS — R0989 Other specified symptoms and signs involving the circulatory and respiratory systems: Secondary | ICD-10-CM | POA: Diagnosis not present

## 2016-06-30 DIAGNOSIS — R1110 Vomiting, unspecified: Secondary | ICD-10-CM | POA: Diagnosis not present

## 2016-07-01 DIAGNOSIS — R1110 Vomiting, unspecified: Secondary | ICD-10-CM | POA: Diagnosis not present

## 2016-07-01 LAB — CBC AND DIFFERENTIAL
HEMATOCRIT: 38 % (ref 36–46)
Hemoglobin: 11.6 g/dL — AB (ref 12.0–16.0)
Platelets: 143 10*3/uL — AB (ref 150–399)
WBC: 5.9 10^3/mL

## 2016-07-01 LAB — HEPATIC FUNCTION PANEL
ALK PHOS: 62 U/L (ref 25–125)
ALT: 13 U/L (ref 7–35)
AST: 25 U/L (ref 13–35)
Bilirubin, Total: 0.7 mg/dL

## 2016-07-01 LAB — BASIC METABOLIC PANEL
BUN: 27 mg/dL — AB (ref 4–21)
Creatinine: 1.3 mg/dL — AB (ref 0.5–1.1)
GLUCOSE: 115 mg/dL
Sodium: 137 mmol/L (ref 137–147)

## 2016-07-07 ENCOUNTER — Ambulatory Visit (INDEPENDENT_AMBULATORY_CARE_PROVIDER_SITE_OTHER): Payer: Medicare Other | Admitting: Orthopaedic Surgery

## 2016-07-10 ENCOUNTER — Other Ambulatory Visit: Payer: Self-pay

## 2016-07-10 MED ORDER — HYDROCODONE-ACETAMINOPHEN 5-325 MG PO TABS: ORAL_TABLET | ORAL | 0 refills | Status: DC

## 2016-07-10 NOTE — Telephone Encounter (Signed)
Prescription request was received from:  [facility] [address] [place] [address]  Phone: [phone]  Fax: [phone]  

## 2016-07-23 ENCOUNTER — Non-Acute Institutional Stay (SKILLED_NURSING_FACILITY): Payer: Medicare Other | Admitting: Family

## 2016-07-23 DIAGNOSIS — I5030 Unspecified diastolic (congestive) heart failure: Secondary | ICD-10-CM

## 2016-07-23 DIAGNOSIS — E782 Mixed hyperlipidemia: Secondary | ICD-10-CM | POA: Diagnosis not present

## 2016-07-23 DIAGNOSIS — E039 Hypothyroidism, unspecified: Secondary | ICD-10-CM

## 2016-07-23 DIAGNOSIS — N183 Chronic kidney disease, stage 3 unspecified: Secondary | ICD-10-CM

## 2016-07-23 DIAGNOSIS — I5032 Chronic diastolic (congestive) heart failure: Secondary | ICD-10-CM

## 2016-07-23 DIAGNOSIS — I130 Hypertensive heart and chronic kidney disease with heart failure and stage 1 through stage 4 chronic kidney disease, or unspecified chronic kidney disease: Secondary | ICD-10-CM | POA: Diagnosis not present

## 2016-07-23 NOTE — Progress Notes (Signed)
Location:  [facility] Room Number: [id number] A Place of Service:  SNF (31) Provider:  Dinah Ngetich FNP-C   [name], MD  Patient Care Team: [name], MD as PCP - General (Internal Medicine) [name], NP as Nurse Practitioner (Nurse Practitioner)  Extended Emergency Contact Information Primary Emergency Contact: Deanda,[name] Address: [id number], [place] Phone: [phone] Mobile Phone: [phone] Relation: Son Secondary Emergency Contact: [name] [place] Phone: [phone] Mobile Phone: [phone] Relation: Daughter  Code Status:  Full Code  Goals of care: Advanced Directive information Advanced Directives 03/24/2016  Does Patient Have a Medical Advance Directive? No  Type of Advance Directive -  Does patient want to make changes to medical advance directive? -  Copy of [place] in Chart? -  Would patient like information on creating a medical advance directive? -  Pre-existing out of facility DNR order (yellow form or pink MOST form) -     Chief Complaint  Patient presents with  . Medical Management of Chronic Issues    HPI:  Pt is a 83 y.o. female seen today at [facility] for medical management of chronic diseases.She has a medical history of HTN, CHF,CKD stage 3, CVA, Hyperlipidemia, CAD, RA  among other conditions.She is seen in her room today. She denies any acute issues this visit.She states previous coughing resolved with antibiotics. Facility Nurse reports no recent She fall episodes or weight changes. She has had no acute illness since prior visit.    Past Medical History:  Diagnosis Date  . Anxiety   . Arthritis   . Chronic diastolic CHF (congestive heart failure) ([place])    a. 10/2014 Echo: EF 60-65%, mild LVH, grade 1 DD, dynamic obstruction @ rest - peak velocity of 271 cm/sec, peak grad of  52mmHg, PASP 62mmHg.  Marland Kitchen Coronary artery disease    a. 2012 s/p NSTEMI/Cath: 95% apical LAD dzs, otw nonobs dzs-->Med Rx;  b. 05/2011 MV: no ischemia, EF 79%, inf infarct- attenuation.  . CVA (cerebral vascular accident) ([place])   . Depression   . DVT of lower extremity (deep venous thrombosis) ([place])   . Dyspnea   . Elevated troponin    a. 10/2014 in setting of sepsis/?HCAP.  Marland Kitchen Enteritis due to Clostridium difficile 01/04/2013  . Essential hypertension   . Fall   . Fatigue   . GERD (gastroesophageal reflux disease)   . GI bleed 2005  . HCAP (healthcare-associated pneumonia) 01/22/2013  . Hemorrhoids   . Hiatal hernia   . Hyperlipidemia   . Hypothyroidism   . Malnutrition ([place])   . Obstructive hypertrophic cardiomyopathy ([place])    a. 10/2014 Echo: EF 60-65%, mild LVH, grade 1 DD, dynamic obstruction @ rest - peak velocity of 271 cm/sec, peak grad of 42mmHg, PASP 42mmHg.  . Osteoporosis   . PUD (peptic ulcer disease)   . RA (rheumatoid arthritis) ([place])   . Recurrent colitis due to Clostridium difficile 06/02/2012  . Renal insufficiency   . Right ear pain   . Sepsis ([place]) 12/19/2012  . TIA (transient ischemic attack)   . Weight loss    Past Surgical History:  Procedure Laterality Date  . ANKLE SURGERY    . BACK SURGERY    . [place]   x3  . CARDIAC CATHETERIZATION     SHOWED RUPTURE PLAQUE  IN THE LAD. THE LAD IS NONOBSTRUCTIVE WITH ONLY 30-40% STENOSIS  . COLONOSCOPY    . KNEE SURGERY    . NSTEMI  06/2010  . SPINAL FUSION SURGERY      Allergies  Allergen Reactions  . Biphosphate   . Codeine Swelling  . Morphine And Related Other (See Comments)    unknown  . Percocet [Oxycodone-Acetaminophen]     unknown  . Plavix [Clopidogrel Bisulfate] Other (See Comments)    RASH  . Sulfur Other (See Comments)    GI upset    Allergies as of 07/23/2016      Reactions   Biphosphate    Codeine Swelling   Morphine And Related Other (See Comments)   unknown   Percocet  [oxycodone-acetaminophen]    unknown   Plavix [clopidogrel Bisulfate] Other (See Comments)   RASH   Sulfur Other (See Comments)   GI upset      Medication List       Accurate as of 07/23/16  5:33 PM. Always use your most recent med list.          ACT DRY MOUTH Lozg Use as directed in the mouth or throat as needed (for dry mouth).   aspirin 81 MG chewable tablet Chew 81 mg by mouth daily.   BIOFREEZE 4 % Gel Generic drug:  Menthol (Topical Analgesic) Apply 1 application topically every 8 (eight) hours.   calcium-vitamin D 500-200 MG-UNIT tablet Commonly known as:  OSCAL WITH D Take 1 tablet by mouth 3 (three) times daily.   carboxymethylcellulose 1 % ophthalmic solution Apply 1 drop to eye 3 (three) times daily. Both eyes   CERTAVITE/ANTIOXIDANTS PO Take 1 tablet by mouth daily.   cetirizine 10 MG tablet Commonly known as:  ZYRTEC Take 10 mg by mouth daily.   Cranberry 200 MG Caps Take 2 capsules by mouth 2 (two) times daily. 2 tablets to = 400 mg BID   denosumab 60 MG/ML Soln injection Commonly known as:  PROLIA Inject 60 mg into the skin every 6 (six) months. Administer in upper arm, thigh, or abdomen   diclofenac sodium 1 % Gel Commonly known as:  VOLTAREN Apply 2 g topically every 6 (six) hours. Apply to left shoulder   ELIQUIS 5 MG Tabs tablet Generic drug:  apixaban Take 5 mg by mouth 2 (two) times daily. For DVT   esomeprazole 40 MG capsule Commonly known as:  NEXIUM Take 40 mg by mouth 2 (two) times daily before a meal. Take one capsule by mouth twice daily for stomach   ferrous sulfate 325 (65 FE) MG tablet Take 325 mg by mouth 3 (three) times daily with meals.   Fish Oil 1200 MG Caps Take 1,200 mg by mouth daily.   hydrochlorothiazide 12.5 MG tablet Commonly known as:  HYDRODIURIL Take 1 tablet (12.5 mg total) by mouth daily.   HYDROcodone-acetaminophen 5-325 MG tablet Commonly known as:  NORCO/VICODIN Take one tablet by mouth twice daily  for pain. Do not exceed 3gm of Tylenol in 24 hours   ipratropium 0.03 % nasal spray Commonly known as:  ATROVENT Place 2 sprays into both nostrils daily as needed (For asthma).   ipratropium-albuterol 0.5-2.5 (3) MG/3ML Soln Commonly known as:  DUONEB Take 3 mLs by nebulization every 8 (eight) hours as needed. For shortness of breath   levothyroxine 50 MCG tablet Commonly known as:  SYNTHROID, LEVOTHROID Take 50 mcg by mouth daily.   lidocaine 5 % Commonly known as:  [place]  1 patch onto the skin daily. Remove & Discard patch within 12 hours or as directed by MD   LORazepam 0.5 MG tablet Commonly known as:  ATIVAN Take one tablet by mouth every night at bedtime for anxiety   meclizine 12.5 MG tablet Commonly known as:  ANTIVERT Take 12.5 mg by mouth 2 (two) times daily.   methocarbamol 500 MG tablet Commonly known as:  ROBAXIN Take 500 mg by mouth at bedtime. For muscle spasms/shoulder pain.   metoprolol 50 MG tablet Commonly known as:  LOPRESSOR Take 50 mg by mouth 2 (two) times daily.   montelukast 10 MG tablet Commonly known as:  SINGULAIR Take 10 mg by mouth at bedtime.   nitrofurantoin (macrocrystal-monohydrate) 100 MG capsule Commonly known as:  MACROBID Take 100 mg by mouth daily.   nitroGLYCERIN 0.4 MG SL tablet Commonly known as:  NITROSTAT Place 0.4 mg under the tongue every 5 (five) minutes as needed for chest pain.   ondansetron 4 MG tablet Commonly known as:  ZOFRAN Take 4 mg by mouth every 6 (six) hours as needed for nausea or vomiting.   OXYGEN Inhale 2 L into the lungs as needed (To maintain saturations >90%).   PATADAY 0.2 % Soln Generic drug:  Olopatadine HCl Place 1 drop into both eyes daily.   predniSONE 10 MG tablet Commonly known as:  DELTASONE Take 10 mg by mouth daily. For RA   promethazine 25 MG tablet Commonly known as:  PHENERGAN Take 25 mg by mouth every 12 (twelve) hours as needed for nausea or vomiting.     rosuvastatin 20 MG tablet Commonly known as:  CRESTOR Take 20 mg by mouth every evening. For hyperlipidemia   sertraline 50 MG tablet Commonly known as:  ZOLOFT Take 50 mg by mouth every morning. For depression   Vitamin D 2000 units Caps Take 1 capsule (2,000 Units total) by mouth daily.       Review of Systems  Constitutional: Negative for activity change, appetite change, chills, fatigue and fever.  HENT: Negative for congestion, rhinorrhea, sinus pressure, sneezing and sore throat.   Eyes: Negative.   Respiratory: Negative for cough, chest tightness, shortness of breath and wheezing.        Shortness of breath sometimes.   Cardiovascular: Negative for chest pain, palpitations and leg swelling.  Gastrointestinal: Negative for abdominal distention, abdominal pain, constipation, diarrhea, nausea and vomiting.  Endocrine: Negative for cold intolerance, heat intolerance, polydipsia, polyphagia and polyuria.  Genitourinary: Negative for difficulty urinating, dysuria, flank pain, frequency, urgency, vaginal discharge and vaginal pain.  Musculoskeletal: Positive for gait problem.  Skin: Negative for color change, pallor and rash.  Neurological: Negative for dizziness, seizures, light-headedness and headaches.  Hematological: Does not bruise/bleed easily.  Psychiatric/Behavioral: Negative for agitation, confusion, hallucinations and sleep disturbance. The patient is not nervous/anxious.     Immunization History  Administered Date(s) Administered  . Influenza-Unspecified 05/09/2013, 04/27/2014, 04/17/2015, 09/17/2015  . PPD Test 12/22/2012, 03/14/2014, 03/08/2015  . Pneumococcal-Unspecified 05/09/2013, 09/09/2013   Pertinent  Health Maintenance Due  Topic Date Due  . INFLUENZA VACCINE  06/30/2017 (Originally 01/29/2016)  . DEXA SCAN  Completed  . PNA vac Low Risk Adult  Addressed   Fall Risk  10/04/2014  Falls in the past year? Yes  Number falls in past yr: 2 or more  Injury  with Fall? No  Risk Factor Category  High Fall Risk  Risk for fall due to : Impaired balance/gait;Impaired mobility;Medication side effect;History of fall(s)  Follow  up Education provided;Falls evaluation completed;Falls prevention discussed      Vitals:   07/23/16 1145  BP: (!) 154/75  Pulse: 70  Resp: 16  Temp: 98.6 F (37 C)  SpO2: 97%  Weight: 137 lb (62.1 kg)  Height: 5\' 6"  (1.676 m)   Body mass index is 22.11 kg/m. Physical Exam  Constitutional: She is oriented to person, place, and time. She appears well-developed and well-nourished. No distress.  elderly  HENT:  Head: Normocephalic and atraumatic.  Mouth/Throat: Oropharynx is clear and moist.  Eyes: Conjunctivae and EOM are normal. Pupils are equal, round, and reactive to light. Right eye exhibits no discharge. Left eye exhibits no discharge. No scleral icterus.  Neck: Normal range of motion. No JVD present. No thyromegaly present.  Cardiovascular: Normal rate, regular rhythm, normal heart sounds and intact distal pulses.  Exam reveals no gallop and no friction rub.   No murmur heard. Pulmonary/Chest: Effort normal and breath sounds normal. No respiratory distress. She has no wheezes. She has no rales.     Abdominal: Soft. Bowel sounds are normal. She exhibits no distension. There is no tenderness. There is no rebound and no guarding.  Genitourinary: No vaginal discharge found.  Genitourinary Comments: Continent.   Musculoskeletal: She exhibits no edema, tenderness or deformity.  Moves x 4 extremities.Unsteady gait.   Lymphadenopathy:    She has no cervical adenopathy.  Neurological: She is oriented to person, place, and time.  Skin: Skin is warm and dry. No rash noted. No erythema.  Psychiatric: She has a normal mood and affect.    Labs reviewed:  Recent Labs  07/25/15 0706 07/26/15 0655 08/29/15 12/04/15 01/11/16  NA 141 139 144 145 147  K 3.4* 3.3* 4.5 3.9 5.0  CL 109 108  --   --   --   CO2 21* 19*  --    --   --   GLUCOSE 83 87  --   --   --   BUN 18 12 21  29* 20  CREATININE 1.52* 1.38* 1.3* 1.4* 1.2*  CALCIUM 8.4* 8.1*  --   --   --   MG 1.4*  --   --   --   --     Recent Labs  08/29/15  AST 19  ALT 16  ALKPHOS 44    Recent Labs  07/25/15 0706 07/26/15 0655  12/04/15  01/24/16 02/11/16 02/21/16  WBC 17.5* 13.4*  < > 17.5  < > 19.0 9.4 10.7  NEUTROABS  --   --   --  13  --   --   --   --   HGB 10.5* 10.4*  < > 9.8*  < > 11.3* 9.4* 8.9*  HCT 32.6* 32.0*  < > 34*  < > 36 30* 29*  MCV 89.6 87.9  --   --   --   --   --   --   PLT 128* 131*  < > 257  < > 184 174 183  < > = values in this interval not displayed. Lab Results  Component Value Date   TSH 0.53 03/04/2016   Lab Results  Component Value Date   HGBA1C 6.4 01/11/2016   Lab Results  Component Value Date   CHOL 109 01/11/2016   HDL 37 01/11/2016   LDLCALC 39 01/11/2016   LDLDIRECT 60.7 11/13/2010   TRIG 167 (A) 01/11/2016   CHOLHDL 2.9 12/02/2011    Assessment/Plan HTN  B/p stable. Continue on HCZT and  Metoprolol.Monitor BMP  CHF Stable.Exam findings negative.Continue on HCZT and Metoprolol. Monitor weight daily.  Hypothyroidism  Continue on Levothyroxine.Monitor TSH level.    Hyperlipidemia Continue on crestor. Will monitor lipid panel periodically      Family/ staff Communication: reviewed plan of care with patient and facility Nurse supervisor.   Labs/tests ordered: None

## 2016-07-24 ENCOUNTER — Other Ambulatory Visit: Payer: Self-pay

## 2016-07-24 MED ORDER — LORAZEPAM 0.5 MG PO TABS: ORAL_TABLET | ORAL | 5 refills | Status: DC

## 2016-07-24 NOTE — Telephone Encounter (Signed)
Rx faxed to [facility] @ [phone], phone number [phone]  

## 2016-07-30 DIAGNOSIS — I70203 Unspecified atherosclerosis of native arteries of extremities, bilateral legs: Secondary | ICD-10-CM | POA: Diagnosis not present

## 2016-07-30 DIAGNOSIS — M79674 Pain in right toe(s): Secondary | ICD-10-CM | POA: Diagnosis not present

## 2016-07-30 DIAGNOSIS — B351 Tinea unguium: Secondary | ICD-10-CM | POA: Diagnosis not present

## 2016-07-30 DIAGNOSIS — I739 Peripheral vascular disease, unspecified: Secondary | ICD-10-CM | POA: Diagnosis not present

## 2016-08-01 ENCOUNTER — Other Ambulatory Visit: Payer: Self-pay | Admitting: *Deleted

## 2016-08-01 MED ORDER — HYDROCODONE-ACETAMINOPHEN 5-325 MG PO TABS: ORAL_TABLET | ORAL | 0 refills | Status: DC

## 2016-08-01 NOTE — Telephone Encounter (Signed)
[facility] [phone] Fax: [phone]  

## 2016-08-01 NOTE — Telephone Encounter (Signed)
Reprinted in Dr. Cyndi Lennert name

## 2016-08-20 ENCOUNTER — Non-Acute Institutional Stay (SKILLED_NURSING_FACILITY): Payer: Medicare Other | Admitting: Family

## 2016-08-20 ENCOUNTER — Encounter: Payer: Self-pay | Admitting: Family

## 2016-08-20 DIAGNOSIS — I130 Hypertensive heart and chronic kidney disease with heart failure and stage 1 through stage 4 chronic kidney disease, or unspecified chronic kidney disease: Secondary | ICD-10-CM

## 2016-08-20 DIAGNOSIS — F329 Major depressive disorder, single episode, unspecified: Secondary | ICD-10-CM

## 2016-08-20 DIAGNOSIS — E039 Hypothyroidism, unspecified: Secondary | ICD-10-CM | POA: Diagnosis not present

## 2016-08-20 DIAGNOSIS — N183 Chronic kidney disease, stage 3 unspecified: Secondary | ICD-10-CM

## 2016-08-20 DIAGNOSIS — I5032 Chronic diastolic (congestive) heart failure: Secondary | ICD-10-CM | POA: Diagnosis not present

## 2016-08-20 DIAGNOSIS — F32A Depression, unspecified: Secondary | ICD-10-CM

## 2016-08-20 DIAGNOSIS — I5030 Unspecified diastolic (congestive) heart failure: Secondary | ICD-10-CM | POA: Diagnosis not present

## 2016-08-20 NOTE — Progress Notes (Signed)
Location:  [facility] Room Number: [phone] Place of Service:  SNF (31) Provider:  Thurley Francesconi FNP-C   [name], MD  Patient Care Team: [name], MD as PCP - General (Internal Medicine) [name], NP as Nurse Practitioner (Nurse Practitioner)  Extended Emergency Contact Information Primary Emergency Contact: Bowen,[name] Address: [id number] [place], [place] Phone: [phone] Mobile Phone: [phone] Relation: Son Secondary Emergency Contact: [name] [place] Phone: [phone] Mobile Phone: [phone] Relation: Daughter  Code Status:  Full Code  Goals of care: Advanced Directive information Advanced Directives 08/20/2016  Does Patient Have a Medical Advance Directive? No  Type of Advance Directive -  Does patient want to make changes to medical advance directive? -  Copy of [place] in Chart? -  Would patient like information on creating a medical advance directive? -  Pre-existing out of facility DNR order (yellow form or pink MOST form) -     Chief Complaint  Patient presents with  . Medical Management of Chronic Issues    Routine Visit    HPI:  Pt is a 83 y.o. female seen today at [facility] for medical management of chronic diseases.She has a medical history of HTN, CHF,CKD stage 3, CVA, Hyperlipidemia, CAD, RA  among other conditions.She is seen in her room today. She denies any acute issues this visit.No recent acute illness since prior visit. No weight changes or fall episodes.she spends most time in the room. Facility Nurse reports no new concerns.   Past Medical History:  Diagnosis Date  . Anxiety   . Arthritis   . Chronic diastolic CHF (congestive heart failure) ([place])    a. 10/2014 Echo: EF 60-65%, mild LVH, grade 1 DD, dynamic obstruction @ rest - peak velocity of 271 cm/sec, peak grad of 79mmHg, PASP  29mmHg.  Marland Kitchen Coronary artery disease    a. 2012 s/p NSTEMI/Cath: 95% apical LAD dzs, otw nonobs dzs-->Med Rx;  b. 05/2011 MV: no ischemia, EF 79%, inf infarct- attenuation.  . CVA (cerebral vascular accident) ([place])   . Depression   . DVT of lower extremity (deep venous thrombosis) ([place])   . Dyspnea   . Elevated troponin    a. 10/2014 in setting of sepsis/?HCAP.  Marland Kitchen Enteritis due to Clostridium difficile 01/04/2013  . Essential hypertension   . Fall   . Fatigue   . GERD (gastroesophageal reflux disease)   . GI bleed 2005  . HCAP (healthcare-associated pneumonia) 01/22/2013  . Hemorrhoids   . Hiatal hernia   . Hyperlipidemia   . Hypothyroidism   . Malnutrition ([place])   . Obstructive hypertrophic cardiomyopathy ([place])    a. 10/2014 Echo: EF 60-65%, mild LVH, grade 1 DD, dynamic obstruction @ rest - peak velocity of 271 cm/sec, peak grad of 45mmHg, PASP 44mmHg.  . Osteoporosis   . PUD (peptic ulcer disease)   . RA (rheumatoid arthritis) ([place])   . Recurrent colitis due to Clostridium difficile 06/02/2012  . Renal insufficiency   . Right ear pain   . Sepsis ([place]) 12/19/2012  . TIA (transient ischemic attack)   . Weight loss    Past Surgical History:  Procedure Laterality Date  . ANKLE SURGERY    . BACK SURGERY    . [place]   x3  . CARDIAC CATHETERIZATION     SHOWED RUPTURE  PLAQUE IN THE LAD. THE LAD IS NONOBSTRUCTIVE WITH ONLY 30-40% STENOSIS  . COLONOSCOPY    . KNEE SURGERY    . NSTEMI  06/2010  . SPINAL FUSION SURGERY      Allergies  Allergen Reactions  . Biphosphate   . Codeine Swelling  . Morphine And Related Other (See Comments)    unknown  . Percocet [Oxycodone-Acetaminophen]     unknown  . Plavix [Clopidogrel Bisulfate] Other (See Comments)    RASH  . Sulfur Other (See Comments)    GI upset    Allergies as of 08/20/2016      Reactions   Biphosphate    Codeine Swelling   Morphine And Related Other (See Comments)   unknown   Percocet  [oxycodone-acetaminophen]    unknown   Plavix [clopidogrel Bisulfate] Other (See Comments)   RASH   Sulfur Other (See Comments)   GI upset      Medication List       Accurate as of 08/20/16  9:50 AM. Always use your most recent med list.          ACT DRY MOUTH Lozg Use as directed in the mouth or throat as needed (for dry mouth).   aspirin EC 81 MG tablet Take 81 mg by mouth daily.   BIOFREEZE 4 % Gel Generic drug:  Menthol (Topical Analgesic) Apply 1 application topically every 8 (eight) hours. To right wrist area   calcium-vitamin D 500-200 MG-UNIT tablet Commonly known as:  OSCAL WITH D Take 1 tablet by mouth 3 (three) times daily.   carboxymethylcellulose 1 % ophthalmic solution Apply 1 drop to eye 3 (three) times daily. Both eyes   CERTAVITE/ANTIOXIDANTS PO Take 1 tablet by mouth daily.   cetirizine 10 MG tablet Commonly known as:  ZYRTEC Take 10 mg by mouth daily.   Cranberry 200 MG Caps Take 2 capsules by mouth 2 (two) times daily. 2 tablets to = 400 mg BID   denosumab 60 MG/ML Soln injection Commonly known as:  PROLIA Inject 60 mg into the skin every 6 (six) months. Administer in upper arm, thigh, or abdomen   diclofenac sodium 1 % Gel Commonly known as:  VOLTAREN Apply 2 g topically every 6 (six) hours. Apply to left shoulder   ELIQUIS 5 MG Tabs tablet Generic drug:  apixaban Take 5 mg by mouth 2 (two) times daily. For DVT   esomeprazole 40 MG capsule Commonly known as:  NEXIUM Take 40 mg by mouth 2 (two) times daily before a meal.   ferrous sulfate 325 (65 FE) MG tablet Take 325 mg by mouth 3 (three) times daily with meals.   Fish Oil 1200 MG Caps Take 1,200 mg by mouth daily.   hydrochlorothiazide 12.5 MG tablet Commonly known as:  HYDRODIURIL Take 1 tablet (12.5 mg total) by mouth daily.   HYDROcodone-acetaminophen 5-325 MG tablet Commonly known as:  NORCO/VICODIN Take 1 tablet by mouth every 8 (eight) hours as needed for moderate pain  or severe pain.   HYDROcodone-acetaminophen 5-325 MG tablet Commonly known as:  NORCO/VICODIN Take one tablet by mouth twice daily for pain. Do not exceed 3gm of Tylenol in 24 hours   ipratropium 0.03 % nasal spray Commonly known as:  ATROVENT Place 2 sprays into both nostrils daily as needed (For asthma).   ipratropium-albuterol 0.5-2.5 (3) MG/3ML Soln Commonly known as:  DUONEB Take 3 mLs by nebulization every 8 (eight) hours as needed. wheezing   levothyroxine 50 MCG tablet Commonly known  as:  SYNTHROID, LEVOTHROID Take 50 mcg by mouth daily.   lidocaine 5 % Commonly known as:  LIDODERM Place 1 patch onto the skin daily. Remove & Discard patch within 12 hours or as directed by MD   LORazepam 0.5 MG tablet Commonly known as:  ATIVAN Take one tablet by mouth every night at bedtime for anxiety   meclizine 12.5 MG tablet Commonly known as:  ANTIVERT Take 12.5 mg by mouth 2 (two) times daily.   methocarbamol 500 MG tablet Commonly known as:  ROBAXIN Take 500 mg by mouth at bedtime. For muscle spasms/shoulder pain.   metoprolol 50 MG tablet Commonly known as:  LOPRESSOR Take 50 mg by mouth 2 (two) times daily.   montelukast 10 MG tablet Commonly known as:  SINGULAIR Take 10 mg by mouth at bedtime.   nitrofurantoin (macrocrystal-monohydrate) 100 MG capsule Commonly known as:  MACROBID Take 100 mg by mouth daily.   nitroGLYCERIN 0.4 MG SL tablet Commonly known as:  NITROSTAT Place 0.4 mg under the tongue every 5 (five) minutes as needed for chest pain.   ondansetron 4 MG tablet Commonly known as:  ZOFRAN Take 4 mg by mouth every 6 (six) hours as needed for nausea or vomiting.   OXYGEN Inhale 2 L into the lungs as needed (To maintain saturations >90%).   PATADAY 0.2 % Soln Generic drug:  Olopatadine HCl Place 1 drop into both eyes daily.   predniSONE 10 MG tablet Commonly known as:  DELTASONE Take 10 mg by mouth daily. For RA   promethazine 25 MG  tablet Commonly known as:  PHENERGAN Take 25 mg by mouth every 12 (twelve) hours as needed for nausea or vomiting.   rosuvastatin 20 MG tablet Commonly known as:  CRESTOR Take 20 mg by mouth every evening. For hyperlipidemia   sertraline 50 MG tablet Commonly known as:  ZOLOFT Take 50 mg by mouth every morning. For depression   Vitamin D 2000 units Caps Take 1 capsule (2,000 Units total) by mouth daily.       Review of Systems  Constitutional: Negative for activity change, appetite change, chills, fatigue and fever.  HENT: Negative for congestion, rhinorrhea, sinus pressure, sneezing and sore throat.   Eyes: Negative.   Respiratory: Negative for cough, chest tightness, shortness of breath and wheezing.   Cardiovascular: Negative for chest pain, palpitations and leg swelling.  Gastrointestinal: Negative for abdominal distention, abdominal pain, constipation, diarrhea, nausea and vomiting.  Endocrine: Negative for cold intolerance, heat intolerance, polydipsia, polyphagia and polyuria.  Genitourinary: Negative for difficulty urinating, dysuria, flank pain, frequency, urgency, vaginal discharge and vaginal pain.  Musculoskeletal: Positive for gait problem.  Skin: Negative for color change, pallor and rash.  Neurological: Negative for dizziness, seizures, light-headedness and headaches.  Hematological: Does not bruise/bleed easily.  Psychiatric/Behavioral: Negative for agitation, confusion, hallucinations and sleep disturbance. The patient is not nervous/anxious.     Immunization History  Administered Date(s) Administered  . Influenza-Unspecified 05/09/2013, 04/27/2014, 04/17/2015, 09/17/2015, 07/23/2016  . PPD Test 12/22/2012, 03/14/2014, 03/08/2015  . Pneumococcal-Unspecified 05/09/2013, 09/09/2013   Pertinent  Health Maintenance Due  Topic Date Due  . INFLUENZA VACCINE  06/30/2017 (Originally 01/29/2016)  . DEXA SCAN  Completed  . PNA vac Low Risk Adult  Addressed   Fall  Risk  10/04/2014  Falls in the past year? Yes  Number falls in past yr: 2 or more  Injury with Fall? No  Risk Factor Category  High Fall Risk  Risk for fall due to :  Impaired balance/gait;Impaired mobility;Medication side effect;History of fall(s)  Follow up Education provided;Falls evaluation completed;Falls prevention discussed    Vitals:   08/20/16 0903  BP: 140/67  Pulse: 62  Resp: 16  Temp: 97.4 F (36.3 C)  SpO2: 94%  Weight: 136 lb 6.4 oz (61.9 kg)  Height: 5\' 6"  (1.676 m)   Body mass index is 22.02 kg/m. Physical Exam  Constitutional: She is oriented to person, place, and time. She appears well-developed and well-nourished. No distress.  HENT:  Head: Normocephalic and atraumatic.  Mouth/Throat: Oropharynx is clear and moist.  Eyes: Conjunctivae and EOM are normal. Pupils are equal, round, and reactive to light. Right eye exhibits no discharge. Left eye exhibits no discharge. No scleral icterus.  Neck: Normal range of motion. No JVD present. No thyromegaly present.  Cardiovascular: Normal rate, regular rhythm, normal heart sounds and intact distal pulses.  Exam reveals no gallop and no friction rub.   No murmur heard. Pulmonary/Chest: Effort normal and breath sounds normal. No respiratory distress. She has no wheezes. She has no rales.     Abdominal: Soft. Bowel sounds are normal. She exhibits no distension. There is no tenderness. There is no rebound and no guarding.  Genitourinary: No vaginal discharge found.  Genitourinary Comments: Continent.   Musculoskeletal: She exhibits no edema, tenderness or deformity.  Moves x 4 extremities.Unsteady gait.   Lymphadenopathy:    She has no cervical adenopathy.  Neurological: She is oriented to person, place, and time.  Skin: Skin is warm and dry. No rash noted. No erythema.  Psychiatric: She has a normal mood and affect.    Labs reviewed:  Recent Labs  12/04/15 01/11/16 06/25/16 07/01/16  NA 145 147 146 137  K 3.9 5.0  4.5  --   BUN 29* 20 28* 27*  CREATININE 1.4* 1.2* 1.2* 1.3*    Recent Labs  08/29/15 07/01/16  AST 19 25  ALT 16 13  ALKPHOS 44 62    Recent Labs  12/04/15  02/21/16 06/25/16 07/01/16  WBC 17.5  < > 10.7 11.1 5.9  NEUTROABS 13  --   --   --   --   HGB 9.8*  < > 8.9* 10.2* 11.6*  HCT 34*  < > 29* 33* 38  PLT 257  < > 183 144* 143*  < > = values in this interval not displayed. Lab Results  Component Value Date   TSH 0.45 06/25/2016   Lab Results  Component Value Date   HGBA1C 6.4 01/11/2016   Lab Results  Component Value Date   CHOL 109 01/11/2016   HDL 37 01/11/2016   LDLCALC 39 01/11/2016   LDLDIRECT 60.7 11/13/2010   TRIG 167 (A) 01/11/2016   CHOLHDL 2.9 12/02/2011    Assessment/Plan HTN  B/p at goal. Continue on HCZT and Metoprolol.On ASA EC for prophylaxis. Monitor BMP  CHF Stable.Exam findings negative.Continue on HCZT and Metoprolol. Monitor weight daily.  Hypothyroidism  Continue on Levothyroxine.Monitor TSH level.    Depression  Stable. Continue sertraline. Encouraged to participate in the facility activities.       Family/ staff Communication: reviewed plan of care with patient and facility Nurse supervisor.   Labs/tests ordered: None

## 2016-09-04 ENCOUNTER — Encounter (HOSPITAL_COMMUNITY): Payer: Self-pay | Admitting: Emergency Medicine

## 2016-09-04 ENCOUNTER — Emergency Department (HOSPITAL_COMMUNITY)
Admission: EM | Admit: 2016-09-04 | Discharge: 2016-09-05 | Disposition: A | Discharge status: Home / Self Care | Payer: Medicare Other | Attending: Emergency Medicine | Admitting: Emergency Medicine

## 2016-09-04 ENCOUNTER — Emergency Department (HOSPITAL_COMMUNITY): Payer: Medicare Other

## 2016-09-04 DIAGNOSIS — I252 Old myocardial infarction: Secondary | ICD-10-CM | POA: Diagnosis not present

## 2016-09-04 DIAGNOSIS — G459 Transient cerebral ischemic attack, unspecified: Secondary | ICD-10-CM | POA: Insufficient documentation

## 2016-09-04 DIAGNOSIS — Z8673 Personal history of transient ischemic attack (TIA), and cerebral infarction without residual deficits: Secondary | ICD-10-CM | POA: Insufficient documentation

## 2016-09-04 DIAGNOSIS — E039 Hypothyroidism, unspecified: Secondary | ICD-10-CM | POA: Diagnosis not present

## 2016-09-04 DIAGNOSIS — N183 Chronic kidney disease, stage 3 (moderate): Secondary | ICD-10-CM | POA: Diagnosis not present

## 2016-09-04 DIAGNOSIS — Z79899 Other long term (current) drug therapy: Secondary | ICD-10-CM | POA: Diagnosis not present

## 2016-09-04 DIAGNOSIS — I2510 Atherosclerotic heart disease of native coronary artery without angina pectoris: Secondary | ICD-10-CM | POA: Insufficient documentation

## 2016-09-04 DIAGNOSIS — I130 Hypertensive heart and chronic kidney disease with heart failure and stage 1 through stage 4 chronic kidney disease, or unspecified chronic kidney disease: Secondary | ICD-10-CM | POA: Insufficient documentation

## 2016-09-04 DIAGNOSIS — I5032 Chronic diastolic (congestive) heart failure: Secondary | ICD-10-CM | POA: Diagnosis not present

## 2016-09-04 DIAGNOSIS — Z7982 Long term (current) use of aspirin: Secondary | ICD-10-CM | POA: Diagnosis not present

## 2016-09-04 DIAGNOSIS — R9431 Abnormal electrocardiogram [ECG] [EKG]: Secondary | ICD-10-CM | POA: Diagnosis not present

## 2016-09-04 DIAGNOSIS — Z7901 Long term (current) use of anticoagulants: Secondary | ICD-10-CM | POA: Insufficient documentation

## 2016-09-04 DIAGNOSIS — R404 Transient alteration of awareness: Secondary | ICD-10-CM | POA: Diagnosis not present

## 2016-09-04 DIAGNOSIS — R4781 Slurred speech: Secondary | ICD-10-CM | POA: Diagnosis present

## 2016-09-04 DIAGNOSIS — R531 Weakness: Secondary | ICD-10-CM | POA: Diagnosis not present

## 2016-09-04 LAB — I-STAT TROPONIN, ED: Troponin i, poc: 0.02 ng/mL (ref 0.00–0.08)

## 2016-09-04 LAB — URINALYSIS, ROUTINE W REFLEX MICROSCOPIC
BACTERIA UA: NONE SEEN
Bilirubin Urine: NEGATIVE
Glucose, UA: 50 mg/dL — AB
Hgb urine dipstick: NEGATIVE
KETONES UR: NEGATIVE mg/dL
Nitrite: NEGATIVE
PROTEIN: NEGATIVE mg/dL
Specific Gravity, Urine: 1.016 (ref 1.005–1.030)
Squamous Epithelial / LPF: NONE SEEN
pH: 6 (ref 5.0–8.0)

## 2016-09-04 LAB — COMPREHENSIVE METABOLIC PANEL
ALBUMIN: 3.2 g/dL — AB (ref 3.5–5.0)
ALK PHOS: 49 U/L (ref 38–126)
ALT: 19 U/L (ref 14–54)
ANION GAP: 9 (ref 5–15)
AST: 36 U/L (ref 15–41)
BILIRUBIN TOTAL: 0.8 mg/dL (ref 0.3–1.2)
BUN: 30 mg/dL — ABNORMAL HIGH (ref 6–20)
CALCIUM: 10.2 mg/dL (ref 8.9–10.3)
CO2: 26 mmol/L (ref 22–32)
Chloride: 104 mmol/L (ref 101–111)
Creatinine, Ser: 1.44 mg/dL — ABNORMAL HIGH (ref 0.44–1.00)
GFR calc non Af Amer: 32 mL/min — ABNORMAL LOW (ref >60)
GFR, EST AFRICAN AMERICAN: 37 mL/min — AB (ref >60)
GLUCOSE: 220 mg/dL — AB (ref 65–99)
Potassium: 4.2 mmol/L (ref 3.5–5.1)
Sodium: 139 mmol/L (ref 135–145)
TOTAL PROTEIN: 5.8 g/dL — AB (ref 6.5–8.1)

## 2016-09-04 LAB — I-STAT CHEM 8, ED
BUN: 40 mg/dL — ABNORMAL HIGH (ref 6–20)
CALCIUM ION: 1.29 mmol/L (ref 1.15–1.40)
CHLORIDE: 103 mmol/L (ref 101–111)
Creatinine, Ser: 1.3 mg/dL — ABNORMAL HIGH (ref 0.44–1.00)
Glucose, Bld: 221 mg/dL — ABNORMAL HIGH (ref 65–99)
HCT: 34 % — ABNORMAL LOW (ref 36.0–46.0)
Hemoglobin: 11.6 g/dL — ABNORMAL LOW (ref 12.0–15.0)
Potassium: 4.1 mmol/L (ref 3.5–5.1)
Sodium: 140 mmol/L (ref 135–145)
TCO2: 30 mmol/L (ref 0–100)

## 2016-09-04 LAB — DIFFERENTIAL
Basophils Absolute: 0 10*3/uL (ref 0.0–0.1)
Basophils Relative: 0 %
EOS ABS: 0.1 10*3/uL (ref 0.0–0.7)
EOS PCT: 1 %
LYMPHS ABS: 0.9 10*3/uL (ref 0.7–4.0)
LYMPHS PCT: 8 %
MONO ABS: 0.8 10*3/uL (ref 0.1–1.0)
MONOS PCT: 8 %
Neutro Abs: 9.1 10*3/uL — ABNORMAL HIGH (ref 1.7–7.7)
Neutrophils Relative %: 83 %

## 2016-09-04 LAB — CBC
HEMATOCRIT: 36.3 % (ref 36.0–46.0)
HEMOGLOBIN: 11.4 g/dL — AB (ref 12.0–15.0)
MCH: 27.4 pg (ref 26.0–34.0)
MCHC: 31.4 g/dL (ref 30.0–36.0)
MCV: 87.3 fL (ref 78.0–100.0)
Platelets: 153 10*3/uL (ref 150–400)
RBC: 4.16 MIL/uL (ref 3.87–5.11)
RDW: 15.8 % — ABNORMAL HIGH (ref 11.5–15.5)
WBC: 10.9 10*3/uL — ABNORMAL HIGH (ref 4.0–10.5)

## 2016-09-04 LAB — PROTIME-INR
INR: 1.31
Prothrombin Time: 16.4 seconds — ABNORMAL HIGH (ref 11.4–15.2)

## 2016-09-04 LAB — CBG MONITORING, ED: GLUCOSE-CAPILLARY: 240 mg/dL — AB (ref 65–99)

## 2016-09-04 LAB — APTT: aPTT: 30 seconds (ref 24–36)

## 2016-09-04 NOTE — ED Provider Notes (Signed)
[facility] DEPT Provider Note   CSN: [id number] Arrival date & time: 09/04/16  1905     History   Chief Complaint Chief Complaint  Patient presents with  . stoke symptoms    HPI [name] is a 83 y.o. female. Pt has complex medical history as listed below, notably 3 prior CVA without residual deficit, who presents from her nursing facility due to 2 days of left arm weakness. She also developed slurred speech this morning. She is accompanied by her daughter, Edwin Cap, who reports they have noticed her having some difficulty with cognition over the last few weeks. Yesterday they noticed she wasn't using her left arm as much. Pt denies any injury to the left arm. This morning, family noticed she was slurring her words slightly and seemed to have some word finding difficulty. They report her speech occasionally gets slurred, but the word finding difficulty was new. All of these symptoms have since resolved, but they can't tell me when exactly they improved. Patient denies any falls, headache, fever, chills, cough, or dysuria.  HPI  Past Medical History:  Diagnosis Date  . Anxiety   . Arthritis   . Chronic diastolic CHF (congestive heart failure) ([place])    a. 10/2014 Echo: EF 60-65%, mild LVH, grade 1 DD, dynamic obstruction @ rest - peak velocity of 271 cm/sec, peak grad of 54mmHg, PASP 61mmHg.  Marland Kitchen Coronary artery disease    a. 2012 s/p NSTEMI/Cath: 95% apical LAD dzs, otw nonobs dzs-->Med Rx;  b. 05/2011 MV: no ischemia, EF 79%, inf infarct- attenuation.  . CVA (cerebral vascular accident) ([place])   . Depression   . DVT of lower extremity (deep venous thrombosis) ([place])   . Dyspnea   . Elevated troponin    a. 10/2014 in setting of sepsis/?HCAP.  Marland Kitchen Enteritis due to Clostridium difficile 01/04/2013  . Essential hypertension   . Fall   . Fatigue   . GERD (gastroesophageal reflux disease)   . GI bleed 2005  . HCAP (healthcare-associated pneumonia) 01/22/2013  . Hemorrhoids   . Hiatal  hernia   . Hyperlipidemia   . Hypothyroidism   . Malnutrition ([place])   . Obstructive hypertrophic cardiomyopathy ([place])    a. 10/2014 Echo: EF 60-65%, mild LVH, grade 1 DD, dynamic obstruction @ rest - peak velocity of 271 cm/sec, peak grad of 36mmHg, PASP 23mmHg.  . Osteoporosis   . PUD (peptic ulcer disease)   . RA (rheumatoid arthritis) ([place])   . Recurrent colitis due to Clostridium difficile 06/02/2012  . Renal insufficiency   . Right ear pain   . Sepsis ([place]) 12/19/2012  . TIA (transient ischemic attack)   . Weight loss     Patient Active Problem List   Diagnosis Date Noted  . UTI (urinary tract infection) 04/08/2016  . Benign paroxysmal positional vertigo 12/19/2015  . Easy bruising 09/20/2015  . CKD (chronic kidney disease) stage 3, GFR 30-59 ml/min 07/27/2015  . Anemia of chronic disease 07/27/2015  . Metabolic encephalopathy 00/93/8182  . Pulmonary hypertension   . Generalized osteoarthritis 04/11/2015  . Thyroid activity decreased 04/11/2015  . Coronary artery disease   . Obstructive hypertrophic cardiomyopathy ([place])   . Chronic diastolic CHF (congestive heart failure) ([place])   . Vertigo 05/17/2014  . Hemorrhoids 04/24/2014  . Hypothyroidism 12/16/2013  . HLD (hyperlipidemia) 12/16/2013  . Benign hypertensive heart and kidney disease with diastolic CHF, NYHA class II and CKD stage III ([place]) 12/16/2013  . Allergic rhinitis 07/05/2013  . Adrenal insufficiency ([place])  12/08/2011    Class: Chronic  . History of CVA (cerebrovascular accident) 12/07/2011    Class: Acute  . Dysphagia 12/07/2011    Class: Acute  . MI (myocardial infarction)   . RA (rheumatoid arthritis) ([place])   . PUD (peptic ulcer disease)   . Hiatal hernia with gastroesophageal reflux   . Hiatal hernia   . Osteoporosis   . Depression   . Anxiety     Past Surgical History:  Procedure Laterality Date  . ANKLE SURGERY    . BACK SURGERY    . [place]   x3  . CARDIAC CATHETERIZATION      SHOWED RUPTURE PLAQUE IN THE LAD. THE LAD IS NONOBSTRUCTIVE WITH ONLY 30-40% STENOSIS  . COLONOSCOPY    . KNEE SURGERY    . NSTEMI  06/2010  . SPINAL FUSION SURGERY      OB History    No data available       Home Medications    Prior to Admission medications   Medication Sig Start Date End Date Taking? Authorizing Provider  apixaban (ELIQUIS) 5 MG TABS tablet Take 5 mg by mouth 2 (two) times daily. For DVT   Yes Historical Provider, MD  Artificial Saliva (ACT DRY MOUTH) LOZG Use as directed in the mouth or throat as needed (for dry mouth).   Yes Historical Provider, MD  aspirin EC 81 MG tablet Take 81 mg by mouth daily.   Yes Historical Provider, MD  calcium-vitamin D (OSCAL WITH D) 500-200 MG-UNIT per tablet Take 1 tablet by mouth 3 (three) times daily.   Yes Historical Provider, MD  carboxymethylcellulose 1 % ophthalmic solution Apply 1 drop to eye 3 (three) times daily. Both eyes   Yes Historical Provider, MD  cetirizine (ZYRTEC) 10 MG tablet Take 10 mg by mouth daily.    Yes Historical Provider, MD  Cholecalciferol (VITAMIN D3) 2000 units TABS Take 2,000 Units by mouth daily.   Yes Historical Provider, MD  Cranberry 200 MG CAPS Take 2 capsules by mouth 2 (two) times daily.    Yes Historical Provider, MD  denosumab (PROLIA) 60 MG/ML SOLN injection Inject 60 mg into the skin every 6 (six) months. Administer in upper arm, thigh, or abdomen   Yes Historical Provider, MD  diclofenac sodium (VOLTAREN) 1 % GEL Apply 2 g topically every 6 (six) hours. Apply to left shoulder    Yes Historical Provider, MD  esomeprazole (NEXIUM) 40 MG capsule Take 40 mg by mouth 2 (two) times daily before a meal.    Yes Historical Provider, MD  ferrous sulfate 325 (65 FE) MG tablet Take 325 mg by mouth 3 (three) times daily with meals.    Yes Historical Provider, MD  hydrochlorothiazide (HYDRODIURIL) 12.5 MG tablet Take 1 tablet (12.5 mg total) by mouth daily. 01/14/16  Yes Dinah C Ngetich, NP    HYDROcodone-acetaminophen (NORCO/VICODIN) 5-325 MG tablet Take one tablet by mouth twice daily for pain. Do not exceed 3gm of Tylenol in 24 hours 08/01/16  Yes Tiffany L Reed, DO  HYDROcodone-acetaminophen (NORCO/VICODIN) 5-325 MG tablet Take 1 tablet by mouth every 8 (eight) hours as needed for moderate pain or severe pain.   Yes Historical Provider, MD  ipratropium (ATROVENT) 0.03 % nasal spray Place 2 sprays into both nostrils daily as needed (For asthma).    Yes Historical Provider, MD  ipratropium-albuterol (DUONEB) 0.5-2.5 (3) MG/3ML SOLN Take 3 mLs by nebulization every 8 (eight) hours as needed. wheezing   Yes  Historical Provider, MD  levothyroxine (SYNTHROID, LEVOTHROID) 50 MCG tablet Take 50 mcg by mouth daily.    Yes Historical Provider, MD  lidocaine (LIDODERM) 5 % Place 1 patch onto the skin daily. Remove & Discard patch within 12 hours or as directed by MD 04/04/15  Yes Lauree Chandler, NP  LORazepam (ATIVAN) 0.5 MG tablet Take one tablet by mouth every night at bedtime for anxiety 07/24/16  Yes Tiffany L Reed, DO  meclizine (ANTIVERT) 12.5 MG tablet Take 12.5 mg by mouth 2 (two) times daily.    Yes Historical Provider, MD  methocarbamol (ROBAXIN) 500 MG tablet Take 500 mg by mouth at bedtime. For muscle spasms/shoulder pain.   Yes Historical Provider, MD  metoprolol (LOPRESSOR) 50 MG tablet Take 50 mg by mouth 2 (two) times daily.    Yes Historical Provider, MD  montelukast (SINGULAIR) 10 MG tablet Take 10 mg by mouth at bedtime.    Yes Historical Provider, MD  Multiple Vitamins-Minerals (CERTAVITE/ANTIOXIDANTS PO) Take 1 tablet by mouth daily.    Yes Historical Provider, MD  nitrofurantoin, macrocrystal-monohydrate, (MACROBID) 100 MG capsule Take 100 mg by mouth daily.  05/11/16  Yes Historical Provider, MD  nitroGLYCERIN (NITROSTAT) 0.4 MG SL tablet Place 0.4 mg under the tongue every 5 (five) minutes as needed for chest pain.   Yes Historical Provider, MD  Omega-3 Fatty Acids (FISH  OIL) 1200 MG CAPS Take 1,200 mg by mouth daily.   Yes Historical Provider, MD  ondansetron (ZOFRAN) 4 MG tablet Take 4 mg by mouth every 6 (six) hours as needed for nausea or vomiting.   Yes Historical Provider, MD  PATADAY 0.2 % SOLN Place 1 drop into both eyes daily.  03/26/15  Yes Historical Provider, MD  predniSONE (DELTASONE) 10 MG tablet Take 10 mg by mouth daily. For RA 03/08/15  Yes Historical Provider, MD  promethazine (PHENERGAN) 25 MG tablet Take 25 mg by mouth every 12 (twelve) hours as needed for nausea or vomiting.   Yes Historical Provider, MD  rosuvastatin (CRESTOR) 20 MG tablet Take 20 mg by mouth every evening. For hyperlipidemia 07/05/13  Yes [name], NP  sertraline (ZOLOFT) 50 MG tablet Take 50 mg by mouth every morning. For depression   Yes Historical Provider, MD  Cholecalciferol (VITAMIN D) 2000 UNITS CAPS Take 1 capsule (2,000 Units total) by mouth daily. Patient not taking: Reported on 09/04/2016 11/21/13   [name], NP  OXYGEN Inhale 2 L into the lungs as needed (To maintain saturations >90%).     Historical Provider, MD    Family History Family History  Problem Relation Age of Onset  . Kidney disease Mother   . Kidney disease Brother   . Lung cancer Father     Social History Social History  Substance Use Topics  . Smoking status: Never Smoker  . Smokeless tobacco: Never Used  . Alcohol use No     Allergies   Biphosphate; Codeine; Morphine and related; Percocet [oxycodone-acetaminophen]; Plavix [clopidogrel bisulfate]; and Sulfur   Review of Systems Review of Systems  Constitutional: Negative for chills and fever.  HENT: Negative for ear pain and sore throat.   Eyes: Negative for pain and visual disturbance.  Respiratory: Negative for cough and shortness of breath.   Cardiovascular: Negative for chest pain and palpitations.  Gastrointestinal: Negative for abdominal pain and vomiting.  Genitourinary: Negative for dysuria and hematuria.    Musculoskeletal: Negative for arthralgias and back pain.  Skin: Negative for color change and  rash.  Neurological: Positive for speech difficulty and weakness. Negative for seizures, syncope and headaches.  All other systems reviewed and are negative.    Physical Exam Updated Vital Signs BP 154/83   Pulse 86   Temp 97.8 F (36.6 C) (Oral)   Resp 19   LMP  (LMP Unknown)   SpO2 97%   Physical Exam  Constitutional: She is oriented to person, place, and time. She appears well-developed and well-nourished. No distress.  HENT:  Head: Normocephalic and atraumatic.  Eyes: Conjunctivae are normal.  Neck: Neck supple.  Cardiovascular: Normal rate and regular rhythm.   No murmur heard. Pulmonary/Chest: Effort normal and breath sounds normal. No respiratory distress.  Abdominal: Soft. There is no tenderness.  Musculoskeletal: She exhibits deformity. She exhibits no edema.  Chronic deformity to b/l hands and wrists c/w arthritic changes.  Neurological: She is alert and oriented to person, place, and time. She displays normal reflexes. No cranial nerve deficit or sensory deficit. She exhibits normal muscle tone. Coordination normal.  Strength 5/5 in b/l upper extremities. 4/5 strength in b/l lower extremities but symmetric. She has normal sensation to light touch throughout. No CN deficit. Finger to nose testing is normal. Heel to shin not performed due to chronic LE weakness. Pt does not ambulate at baseline.  Skin: Skin is warm and dry.  Psychiatric: She has a normal mood and affect.  Nursing note and vitals reviewed.    ED Treatments / Results  Labs (all labs ordered are listed, but only abnormal results are displayed) Labs Reviewed  PROTIME-INR - Abnormal; Notable for the following:       Result Value   Prothrombin Time 16.4 (*)    All other components within normal limits  CBC - Abnormal; Notable for the following:    WBC 10.9 (*)    Hemoglobin 11.4 (*)    RDW 15.8 (*)     All other components within normal limits  DIFFERENTIAL - Abnormal; Notable for the following:    Neutro Abs 9.1 (*)    All other components within normal limits  COMPREHENSIVE METABOLIC PANEL - Abnormal; Notable for the following:    Glucose, Bld 220 (*)    BUN 30 (*)    Creatinine, Ser 1.44 (*)    Total Protein 5.8 (*)    Albumin 3.2 (*)    GFR calc non Af Amer 32 (*)    GFR calc Af Amer 37 (*)    All other components within normal limits  URINALYSIS, ROUTINE W REFLEX MICROSCOPIC - Abnormal; Notable for the following:    APPearance HAZY (*)    Glucose, UA 50 (*)    Leukocytes, UA TRACE (*)    All other components within normal limits  CBG MONITORING, ED - Abnormal; Notable for the following:    Glucose-Capillary 240 (*)    All other components within normal limits  I-STAT CHEM 8, ED - Abnormal; Notable for the following:    BUN 40 (*)    Creatinine, Ser 1.30 (*)    Glucose, Bld 221 (*)    Hemoglobin 11.6 (*)    HCT 34.0 (*)    All other components within normal limits  APTT  I-STAT TROPOININ, ED    EKG  EKG Interpretation None       Radiology Ct Head Wo Contrast  Result Date: 09/04/2016 CLINICAL DATA:  Left-sided weakness for 1 day EXAM: CT HEAD WITHOUT CONTRAST TECHNIQUE: Contiguous axial images were obtained from the base of the  skull through the vertex without intravenous contrast. COMPARISON:  MRI 05/10/2014, CT scan brain 12/01/2011 FINDINGS: Brain: No acute territorial infarction or intracranial hemorrhage is visualized. There is no focal mass, mass effect or mid line shift. Extensive periventricular, subcortical and deep white matter small vessel ischemic changes. Old small infarcts involving the right frontal lobe, right posterior temporal lobe, and left occipital lobe. Old the laminae Eck and left basal gangliar lacunar infarcts. Moderate atrophy. Ventricles are stable in size. Vascular: No hyperdense vessels. Carotid artery calcifications. Vertebral artery  calcifications. Skull: No fracture.  No suspicious bone lesion. Sinuses/Orbits: Minimal mucosal thickening in the ethmoid sinuses. No acute orbital abnormality. Other: None IMPRESSION: 1. No definite CT evidence for acute intracranial abnormality. 2. Multifocal old infarcts as described above. 3. Extensive small vessel ischemic changes of the bilateral white matter. Electronically Signed   By: Donavan Foil M.D.   On: 09/04/2016 20:11    Procedures Procedures (including critical care time)  Medications Ordered in ED Medications - No data to display   Initial Impression / Assessment and Plan / ED Course  I have reviewed the triage vital signs and the nursing notes.  Pertinent labs & imaging results that were available during my care of the patient were reviewed by me and considered in my medical decision making (see chart for details).    Pt is a very pleasant 83 yo female with hx as above who p/w left arm weakness for 2 days and slurred speech that occurred briefly this morning. Her symptoms have resolved. Her neuro exam is normal here with exception of lower extremity weakness and gait as she is non-ambulatory at baseline. Lower extremity weakness is symmetric and unchanged from baseline. CT head shows multiple old infarcts but no blood and no acute changes. Labs reviewed as above. Mild leukocytosis noted but no infectious signs or symptoms present. She has had no SOB or cough, CXR not indicated. Hyperglycemia noted, doubt DKA given normal bicarb and no hx of Dm. Her sx this morning most likely are in the realm of TIA. She has an outpatient neurologist and is already optimized on her medications. After discussion with the patient, we do not feel that inpatient admission is necessary at this time. She would like to return to her facility and follow-up with Neurology as outpatient. I feel that she is safe for discharge given her good follow-up and good care at her facility. The daughter had left by  the time pt's workup was completed, but I spoke to her daughter-in-law, Pamala Hurry, by phone.  Final Clinical Impressions(s) / ED Diagnoses   Final diagnoses:  Transient cerebral ischemia, unspecified type    New Prescriptions Discharge Medication List as of 09/04/2016 11:34 PM       Clifton James, MD 09/05/16 [id number]    Charlesetta Shanks, MD 09/07/16 [phone]

## 2016-09-04 NOTE — ED Notes (Signed)
[facility] - contact # [phone] (pt daughter) Asked to be informed about pt care plan.

## 2016-09-04 NOTE — Discharge Instructions (Signed)
-   Follow up with Neurology  - Continue your current medications

## 2016-09-04 NOTE — ED Triage Notes (Signed)
Per [facility]  Pt coming from [facility]. Pt noticed she been having more trouble/takes more thought to move her L arm. Weak in the L arm. When pt is not thinking about the arm it seems to posture back up.  Hx of CVA. Pt has no residual effects except being wheelchair bound. Pt is NON AMBULATORY.   Pt is on blood thinners no falls or head trauma. PERRLA Full code  Hypertensive with EMS  18 LAC A&O x4  LSN 2 nights ago.

## 2016-09-05 DIAGNOSIS — Z8673 Personal history of transient ischemic attack (TIA), and cerebral infarction without residual deficits: Secondary | ICD-10-CM | POA: Diagnosis not present

## 2016-09-05 DIAGNOSIS — G459 Transient cerebral ischemic attack, unspecified: Secondary | ICD-10-CM | POA: Diagnosis not present

## 2016-09-05 NOTE — ED Notes (Signed)
CALLED [facility] FOR TRANSPORT TO [facility]--[name]

## 2016-09-09 DIAGNOSIS — M6281 Muscle weakness (generalized): Secondary | ICD-10-CM | POA: Diagnosis not present

## 2016-09-09 DIAGNOSIS — R2689 Other abnormalities of gait and mobility: Secondary | ICD-10-CM | POA: Diagnosis not present

## 2016-09-10 DIAGNOSIS — M6281 Muscle weakness (generalized): Secondary | ICD-10-CM | POA: Diagnosis not present

## 2016-09-10 DIAGNOSIS — R2689 Other abnormalities of gait and mobility: Secondary | ICD-10-CM | POA: Diagnosis not present

## 2016-09-11 DIAGNOSIS — M6281 Muscle weakness (generalized): Secondary | ICD-10-CM | POA: Diagnosis not present

## 2016-09-11 DIAGNOSIS — R2689 Other abnormalities of gait and mobility: Secondary | ICD-10-CM | POA: Diagnosis not present

## 2016-09-12 DIAGNOSIS — R2689 Other abnormalities of gait and mobility: Secondary | ICD-10-CM | POA: Diagnosis not present

## 2016-09-12 DIAGNOSIS — M6281 Muscle weakness (generalized): Secondary | ICD-10-CM | POA: Diagnosis not present

## 2016-09-14 DIAGNOSIS — M6281 Muscle weakness (generalized): Secondary | ICD-10-CM | POA: Diagnosis not present

## 2016-09-14 DIAGNOSIS — R600 Localized edema: Secondary | ICD-10-CM | POA: Diagnosis not present

## 2016-09-14 DIAGNOSIS — M79642 Pain in left hand: Secondary | ICD-10-CM | POA: Diagnosis not present

## 2016-09-14 DIAGNOSIS — M545 Low back pain: Secondary | ICD-10-CM | POA: Diagnosis not present

## 2016-09-14 DIAGNOSIS — M25532 Pain in left wrist: Secondary | ICD-10-CM | POA: Diagnosis not present

## 2016-09-14 DIAGNOSIS — R2689 Other abnormalities of gait and mobility: Secondary | ICD-10-CM | POA: Diagnosis not present

## 2016-09-15 ENCOUNTER — Inpatient Hospital Stay (HOSPITAL_COMMUNITY): Payer: Medicare Other

## 2016-09-15 ENCOUNTER — Encounter (HOSPITAL_COMMUNITY): Payer: Self-pay | Admitting: Emergency Medicine

## 2016-09-15 ENCOUNTER — Emergency Department (HOSPITAL_COMMUNITY): Payer: Medicare Other

## 2016-09-15 ENCOUNTER — Inpatient Hospital Stay (HOSPITAL_COMMUNITY)
Admission: EM | Admit: 2016-09-15 | Discharge: 2016-09-18 | DRG: 871 | Disposition: A | Discharge status: Skilled Nursing Facility | Payer: Medicare Other | Attending: Nephrology | Admitting: Nephrology

## 2016-09-15 DIAGNOSIS — R102 Pelvic and perineal pain: Secondary | ICD-10-CM | POA: Diagnosis not present

## 2016-09-15 DIAGNOSIS — M6281 Muscle weakness (generalized): Secondary | ICD-10-CM | POA: Diagnosis not present

## 2016-09-15 DIAGNOSIS — I252 Old myocardial infarction: Secondary | ICD-10-CM | POA: Diagnosis not present

## 2016-09-15 DIAGNOSIS — I2510 Atherosclerotic heart disease of native coronary artery without angina pectoris: Secondary | ICD-10-CM | POA: Diagnosis present

## 2016-09-15 DIAGNOSIS — M069 Rheumatoid arthritis, unspecified: Secondary | ICD-10-CM | POA: Diagnosis present

## 2016-09-15 DIAGNOSIS — G9341 Metabolic encephalopathy: Secondary | ICD-10-CM | POA: Diagnosis present

## 2016-09-15 DIAGNOSIS — Z79899 Other long term (current) drug therapy: Secondary | ICD-10-CM

## 2016-09-15 DIAGNOSIS — Z7982 Long term (current) use of aspirin: Secondary | ICD-10-CM | POA: Diagnosis not present

## 2016-09-15 DIAGNOSIS — I130 Hypertensive heart and chronic kidney disease with heart failure and stage 1 through stage 4 chronic kidney disease, or unspecified chronic kidney disease: Secondary | ICD-10-CM | POA: Diagnosis present

## 2016-09-15 DIAGNOSIS — S199XXA Unspecified injury of neck, initial encounter: Secondary | ICD-10-CM | POA: Diagnosis not present

## 2016-09-15 DIAGNOSIS — I272 Pulmonary hypertension, unspecified: Secondary | ICD-10-CM | POA: Diagnosis present

## 2016-09-15 DIAGNOSIS — N179 Acute kidney failure, unspecified: Secondary | ICD-10-CM | POA: Diagnosis not present

## 2016-09-15 DIAGNOSIS — N183 Chronic kidney disease, stage 3 unspecified: Secondary | ICD-10-CM | POA: Diagnosis present

## 2016-09-15 DIAGNOSIS — I5032 Chronic diastolic (congestive) heart failure: Secondary | ICD-10-CM | POA: Diagnosis present

## 2016-09-15 DIAGNOSIS — E785 Hyperlipidemia, unspecified: Secondary | ICD-10-CM | POA: Diagnosis present

## 2016-09-15 DIAGNOSIS — W19XXXA Unspecified fall, initial encounter: Secondary | ICD-10-CM | POA: Diagnosis present

## 2016-09-15 DIAGNOSIS — R488 Other symbolic dysfunctions: Secondary | ICD-10-CM | POA: Diagnosis not present

## 2016-09-15 DIAGNOSIS — Z8673 Personal history of transient ischemic attack (TIA), and cerebral infarction without residual deficits: Secondary | ICD-10-CM | POA: Diagnosis not present

## 2016-09-15 DIAGNOSIS — Z981 Arthrodesis status: Secondary | ICD-10-CM

## 2016-09-15 DIAGNOSIS — Z86718 Personal history of other venous thrombosis and embolism: Secondary | ICD-10-CM

## 2016-09-15 DIAGNOSIS — E875 Hyperkalemia: Secondary | ICD-10-CM | POA: Diagnosis not present

## 2016-09-15 DIAGNOSIS — I421 Obstructive hypertrophic cardiomyopathy: Secondary | ICD-10-CM | POA: Diagnosis not present

## 2016-09-15 DIAGNOSIS — M25432 Effusion, left wrist: Secondary | ICD-10-CM | POA: Diagnosis not present

## 2016-09-15 DIAGNOSIS — M1990 Unspecified osteoarthritis, unspecified site: Secondary | ICD-10-CM | POA: Diagnosis not present

## 2016-09-15 DIAGNOSIS — S0502XA Injury of conjunctiva and corneal abrasion without foreign body, left eye, initial encounter: Secondary | ICD-10-CM | POA: Diagnosis present

## 2016-09-15 DIAGNOSIS — E039 Hypothyroidism, unspecified: Secondary | ICD-10-CM | POA: Diagnosis present

## 2016-09-15 DIAGNOSIS — F0390 Unspecified dementia without behavioral disturbance: Secondary | ICD-10-CM | POA: Diagnosis present

## 2016-09-15 DIAGNOSIS — A419 Sepsis, unspecified organism: Principal | ICD-10-CM | POA: Diagnosis present

## 2016-09-15 DIAGNOSIS — F329 Major depressive disorder, single episode, unspecified: Secondary | ICD-10-CM | POA: Diagnosis present

## 2016-09-15 DIAGNOSIS — N3000 Acute cystitis without hematuria: Secondary | ICD-10-CM | POA: Diagnosis present

## 2016-09-15 DIAGNOSIS — R6520 Severe sepsis without septic shock: Secondary | ICD-10-CM | POA: Diagnosis not present

## 2016-09-15 DIAGNOSIS — N390 Urinary tract infection, site not specified: Secondary | ICD-10-CM | POA: Diagnosis present

## 2016-09-15 DIAGNOSIS — S3993XA Unspecified injury of pelvis, initial encounter: Secondary | ICD-10-CM | POA: Diagnosis not present

## 2016-09-15 DIAGNOSIS — S6992XA Unspecified injury of left wrist, hand and finger(s), initial encounter: Secondary | ICD-10-CM | POA: Diagnosis not present

## 2016-09-15 DIAGNOSIS — Z7952 Long term (current) use of systemic steroids: Secondary | ICD-10-CM

## 2016-09-15 DIAGNOSIS — L03114 Cellulitis of left upper limb: Secondary | ICD-10-CM | POA: Diagnosis present

## 2016-09-15 DIAGNOSIS — Z7901 Long term (current) use of anticoagulants: Secondary | ICD-10-CM | POA: Diagnosis not present

## 2016-09-15 DIAGNOSIS — Z9981 Dependence on supplemental oxygen: Secondary | ICD-10-CM

## 2016-09-15 DIAGNOSIS — Y92129 Unspecified place in nursing home as the place of occurrence of the external cause: Secondary | ICD-10-CM

## 2016-09-15 DIAGNOSIS — G9340 Encephalopathy, unspecified: Secondary | ICD-10-CM | POA: Diagnosis not present

## 2016-09-15 DIAGNOSIS — M25532 Pain in left wrist: Secondary | ICD-10-CM | POA: Diagnosis not present

## 2016-09-15 DIAGNOSIS — R509 Fever, unspecified: Secondary | ICD-10-CM | POA: Diagnosis not present

## 2016-09-15 DIAGNOSIS — R402441 Other coma, without documented Glasgow coma scale score, or with partial score reported, in the field [EMT or ambulance]: Secondary | ICD-10-CM | POA: Diagnosis not present

## 2016-09-15 DIAGNOSIS — N189 Chronic kidney disease, unspecified: Secondary | ICD-10-CM | POA: Diagnosis not present

## 2016-09-15 DIAGNOSIS — R2689 Other abnormalities of gait and mobility: Secondary | ICD-10-CM | POA: Diagnosis not present

## 2016-09-15 LAB — URINALYSIS, ROUTINE W REFLEX MICROSCOPIC
Bilirubin Urine: NEGATIVE
GLUCOSE, UA: NEGATIVE mg/dL
HGB URINE DIPSTICK: NEGATIVE
KETONES UR: NEGATIVE mg/dL
NITRITE: NEGATIVE
Protein, ur: 30 mg/dL — AB
Specific Gravity, Urine: 1.019 (ref 1.005–1.030)
pH: 5 (ref 5.0–8.0)

## 2016-09-15 LAB — CBC WITH DIFFERENTIAL/PLATELET
Basophils Absolute: 0 10*3/uL (ref 0.0–0.1)
Basophils Relative: 0 %
Eosinophils Absolute: 0.2 10*3/uL (ref 0.0–0.7)
Eosinophils Relative: 1 %
HEMATOCRIT: 33.4 % — AB (ref 36.0–46.0)
Hemoglobin: 10.4 g/dL — ABNORMAL LOW (ref 12.0–15.0)
LYMPHS PCT: 7 %
Lymphs Abs: 1.1 10*3/uL (ref 0.7–4.0)
MCH: 27.7 pg (ref 26.0–34.0)
MCHC: 31.1 g/dL (ref 30.0–36.0)
MCV: 89.1 fL (ref 78.0–100.0)
Monocytes Absolute: 1.2 10*3/uL — ABNORMAL HIGH (ref 0.1–1.0)
Monocytes Relative: 8 %
NEUTROS ABS: 12.7 10*3/uL — AB (ref 1.7–7.7)
NEUTROS PCT: 84 %
Platelets: 133 10*3/uL — ABNORMAL LOW (ref 150–400)
RBC: 3.75 MIL/uL — AB (ref 3.87–5.11)
RDW: 16.4 % — ABNORMAL HIGH (ref 11.5–15.5)
WBC: 15.2 10*3/uL — ABNORMAL HIGH (ref 4.0–10.5)

## 2016-09-15 LAB — MRSA PCR SCREENING: MRSA by PCR: NEGATIVE

## 2016-09-15 LAB — I-STAT CG4 LACTIC ACID, ED
Lactic Acid, Venous: 1.71 mmol/L (ref 0.5–1.9)
Lactic Acid, Venous: 1.23 mmol/L (ref 0.5–1.9)

## 2016-09-15 LAB — COMPREHENSIVE METABOLIC PANEL
ALT: 17 U/L (ref 14–54)
ANION GAP: 10 (ref 5–15)
AST: 32 U/L (ref 15–41)
Albumin: 2.7 g/dL — ABNORMAL LOW (ref 3.5–5.0)
Alkaline Phosphatase: 43 U/L (ref 38–126)
BUN: 27 mg/dL — ABNORMAL HIGH (ref 6–20)
CHLORIDE: 105 mmol/L (ref 101–111)
CO2: 24 mmol/L (ref 22–32)
Calcium: 8.8 mg/dL — ABNORMAL LOW (ref 8.9–10.3)
Creatinine, Ser: 1.88 mg/dL — ABNORMAL HIGH (ref 0.44–1.00)
GFR calc non Af Amer: 23 mL/min — ABNORMAL LOW (ref >60)
GFR, EST AFRICAN AMERICAN: 27 mL/min — AB (ref >60)
Glucose, Bld: 141 mg/dL — ABNORMAL HIGH (ref 65–99)
Potassium: 3.8 mmol/L (ref 3.5–5.1)
SODIUM: 139 mmol/L (ref 135–145)
Total Bilirubin: 0.6 mg/dL (ref 0.3–1.2)
Total Protein: 5.2 g/dL — ABNORMAL LOW (ref 6.5–8.1)

## 2016-09-15 LAB — PROTIME-INR
INR: 1.99
Prothrombin Time: 22.9 seconds — ABNORMAL HIGH (ref 11.4–15.2)

## 2016-09-15 MED ORDER — HEPARIN (PORCINE) IN NACL 100-0.45 UNIT/ML-% IJ SOLN
1050.0000 [IU]/h | INTRAMUSCULAR | Status: DC
  Administered 2016-09-15: 1050 [IU]/h via INTRAVENOUS
  Filled 2016-09-15: qty 250

## 2016-09-15 MED ORDER — PREDNISONE 10 MG PO TABS
10.0000 mg | ORAL_TABLET | Freq: Every day | ORAL | Status: DC
  Administered 2016-09-16 – 2016-09-18 (×3): 10 mg via ORAL
  Filled 2016-09-15 (×3): qty 1

## 2016-09-15 MED ORDER — ACETAMINOPHEN 325 MG PO TABS
650.0000 mg | ORAL_TABLET | Freq: Four times a day (QID) | ORAL | Status: DC | PRN
  Administered 2016-09-16: 650 mg via ORAL
  Filled 2016-09-15: qty 2

## 2016-09-15 MED ORDER — VANCOMYCIN HCL IN DEXTROSE 750-5 MG/150ML-% IV SOLN
750.0000 mg | INTRAVENOUS | Status: DC
  Administered 2016-09-16 – 2016-09-17 (×2): 750 mg via INTRAVENOUS
  Filled 2016-09-15 (×3): qty 150

## 2016-09-15 MED ORDER — METOPROLOL TARTRATE 50 MG PO TABS
50.0000 mg | ORAL_TABLET | Freq: Two times a day (BID) | ORAL | Status: DC
Start: 2016-09-15 — End: 2016-09-18
  Administered 2016-09-15 – 2016-09-18 (×6): 50 mg via ORAL
  Filled 2016-09-15 (×6): qty 1

## 2016-09-15 MED ORDER — POLYVINYL ALCOHOL 1.4 % OP SOLN
1.0000 [drp] | Freq: Three times a day (TID) | OPHTHALMIC | Status: DC
  Administered 2016-09-16 – 2016-09-18 (×8): 1 [drp] via OPHTHALMIC
  Filled 2016-09-15 (×2): qty 15

## 2016-09-15 MED ORDER — SODIUM CHLORIDE 0.9 % IV BOLUS (SEPSIS)
1000.0000 mL | Freq: Once | INTRAVENOUS | Status: AC
  Administered 2016-09-15: 1000 mL via INTRAVENOUS

## 2016-09-15 MED ORDER — PIPERACILLIN-TAZOBACTAM 3.375 G IVPB
3.3750 g | Freq: Three times a day (TID) | INTRAVENOUS | Status: DC
  Administered 2016-09-16 – 2016-09-18 (×8): 3.375 g via INTRAVENOUS
  Filled 2016-09-15 (×9): qty 50

## 2016-09-15 MED ORDER — SODIUM CHLORIDE 0.9 % IV BOLUS (SEPSIS)
250.0000 mL | Freq: Once | INTRAVENOUS | Status: AC
  Administered 2016-09-15: 250 mL via INTRAVENOUS

## 2016-09-15 MED ORDER — ASPIRIN EC 81 MG PO TBEC
81.0000 mg | DELAYED_RELEASE_TABLET | Freq: Every day | ORAL | Status: DC
  Administered 2016-09-15 – 2016-09-17 (×3): 81 mg via ORAL
  Filled 2016-09-15 (×3): qty 1

## 2016-09-15 MED ORDER — HYDROCODONE-ACETAMINOPHEN 5-325 MG PO TABS
1.0000 | ORAL_TABLET | Freq: Two times a day (BID) | ORAL | Status: DC | PRN
  Administered 2016-09-18: 1 via ORAL
  Filled 2016-09-15 (×2): qty 1

## 2016-09-15 MED ORDER — ACETAMINOPHEN 650 MG RE SUPP: 650.0000 mg | Freq: Four times a day (QID) | RECTAL | Status: DC | PRN

## 2016-09-15 MED ORDER — LORAZEPAM 0.5 MG PO TABS
0.5000 mg | ORAL_TABLET | Freq: Every day | ORAL | Status: DC
  Administered 2016-09-15 – 2016-09-17 (×3): 0.5 mg via ORAL
  Filled 2016-09-15 (×3): qty 1

## 2016-09-15 MED ORDER — OMEGA-3-ACID ETHYL ESTERS 1 G PO CAPS
1000.0000 mg | ORAL_CAPSULE | Freq: Every day | ORAL | Status: DC
  Administered 2016-09-16 – 2016-09-18 (×3): 1000 mg via ORAL
  Filled 2016-09-15 (×3): qty 1

## 2016-09-15 MED ORDER — NITROGLYCERIN 0.4 MG SL SUBL: 0.4000 mg | SUBLINGUAL_TABLET | SUBLINGUAL | Status: DC | PRN

## 2016-09-15 MED ORDER — ACETAMINOPHEN 325 MG PO TABS
650.0000 mg | ORAL_TABLET | Freq: Once | ORAL | Status: AC
Start: 2016-09-15 — End: 2016-09-15
  Administered 2016-09-15: 650 mg via ORAL
  Filled 2016-09-15: qty 2

## 2016-09-15 MED ORDER — VANCOMYCIN HCL IN DEXTROSE 1-5 GM/200ML-% IV SOLN
1000.0000 mg | Freq: Once | INTRAVENOUS | Status: AC
  Administered 2016-09-15: 1000 mg via INTRAVENOUS
  Filled 2016-09-15: qty 200

## 2016-09-15 MED ORDER — PIPERACILLIN-TAZOBACTAM 3.375 G IVPB 30 MIN
3.3750 g | Freq: Once | INTRAVENOUS | Status: AC
  Administered 2016-09-15: 3.375 g via INTRAVENOUS
  Filled 2016-09-15: qty 50

## 2016-09-15 MED ORDER — HYDROCORTISONE NA SUCCINATE PF 100 MG IJ SOLR
50.0000 mg | Freq: Once | INTRAMUSCULAR | Status: AC
  Administered 2016-09-15: 50 mg via INTRAVENOUS
  Filled 2016-09-15: qty 2

## 2016-09-15 MED ORDER — SODIUM CHLORIDE 0.9 % IV SOLN
INTRAVENOUS | Status: AC
  Administered 2016-09-16: 02:00:00 via INTRAVENOUS

## 2016-09-15 NOTE — ED Provider Notes (Signed)
[facility] DEPT Provider Note   CSN: [id number] Arrival date & time: 09/15/16  1339     History   Chief Complaint Chief Complaint  Patient presents with  . Fever  . Altered Mental Status    HPI Lenola Lockner is a 83 y.o. female with PMHx of CVA, DVT, CHF, CAD, HTN, MI, RA, dementia, sepsis, previously seen on 3/9 for TIA, presents by EMS today from nursing facility altered mental status, fever, and swelling, redness to left lower arm. Patient denies any pain. Pt states is on xarelto (although her records show eliquis).  Level 5 caveat.  Per daughter pt has history of dementia but has been increased confusion for 2-3 days. Pt has history of this before with similar symptoms secondary to UTI. Daughter also points out that her left arm is swollen, tender and red near her wrist since Friday.  Per EMS: Patient is febrile to touch and is alert to self. Per MEDICAL RECORD NUMBERPatient is alert and oriented 3 during triage. Pt disoriented to time only.   The history is provided by the patient, medical records, the EMS personnel and a relative (Daughter). The history is limited by the condition of the patient. No language interpreter was used.  Fever    Altered Mental Status      Past Medical History:  Diagnosis Date  . Anxiety   . Arthritis   . Chronic diastolic CHF (congestive heart failure) ([place])    a. 10/2014 Echo: EF 60-65%, mild LVH, grade 1 DD, dynamic obstruction @ rest - peak velocity of 271 cm/sec, peak grad of 22mmHg, PASP 49mmHg.  Marland Kitchen Coronary artery disease    a. 2012 s/p NSTEMI/Cath: 95% apical LAD dzs, otw nonobs dzs-->Med Rx;  b. 05/2011 MV: no ischemia, EF 79%, inf infarct- attenuation.  . CVA (cerebral vascular accident) ([place])   . Depression   . DVT of lower extremity (deep venous thrombosis) ([place])   . Dyspnea   . Elevated troponin    a. 10/2014 in setting of sepsis/?HCAP.  Marland Kitchen Enteritis due to Clostridium difficile 01/04/2013  . Essential hypertension   . Fall   .  Fatigue   . GERD (gastroesophageal reflux disease)   . GI bleed 2005  . HCAP (healthcare-associated pneumonia) 01/22/2013  . Hemorrhoids   . Hiatal hernia   . Hyperlipidemia   . Hypothyroidism   . Malnutrition ([place])   . Obstructive hypertrophic cardiomyopathy ([place])    a. 10/2014 Echo: EF 60-65%, mild LVH, grade 1 DD, dynamic obstruction @ rest - peak velocity of 271 cm/sec, peak grad of 26mmHg, PASP 67mmHg.  . Osteoporosis   . PUD (peptic ulcer disease)   . RA (rheumatoid arthritis) ([place])   . Recurrent colitis due to Clostridium difficile 06/02/2012  . Renal insufficiency   . Right ear pain   . Sepsis ([place]) 12/19/2012  . TIA (transient ischemic attack)   . Weight loss     Patient Active Problem List   Diagnosis Date Noted  . UTI (urinary tract infection) 04/08/2016  . Benign paroxysmal positional vertigo 12/19/2015  . Easy bruising 09/20/2015  . CKD (chronic kidney disease) stage 3, GFR 30-59 ml/min 07/27/2015  . Anemia of chronic disease 07/27/2015  . Metabolic encephalopathy 40/98/1191  . Pulmonary hypertension   . Generalized osteoarthritis 04/11/2015  . Thyroid activity decreased 04/11/2015  . Coronary artery disease   . Obstructive hypertrophic cardiomyopathy ([place])   . Chronic diastolic CHF (congestive heart failure) ([place])   . Vertigo 05/17/2014  . Hemorrhoids  04/24/2014  . Hypothyroidism 12/16/2013  . HLD (hyperlipidemia) 12/16/2013  . Benign hypertensive heart and kidney disease with diastolic CHF, NYHA class II and CKD stage III ([place]) 12/16/2013  . Allergic rhinitis 07/05/2013  . Adrenal insufficiency ([place]) 12/08/2011    Class: Chronic  . History of CVA (cerebrovascular accident) 12/07/2011    Class: Acute  . Dysphagia 12/07/2011    Class: Acute  . MI (myocardial infarction)   . RA (rheumatoid arthritis) ([place])   . PUD (peptic ulcer disease)   . Hiatal hernia with gastroesophageal reflux   . Hiatal hernia   . Osteoporosis   . Depression   . Anxiety      Past Surgical History:  Procedure Laterality Date  . ANKLE SURGERY    . BACK SURGERY    . [place]   x3  . CARDIAC CATHETERIZATION     SHOWED RUPTURE PLAQUE IN THE LAD. THE LAD IS NONOBSTRUCTIVE WITH ONLY 30-40% STENOSIS  . COLONOSCOPY    . KNEE SURGERY    . NSTEMI  06/2010  . SPINAL FUSION SURGERY      OB History    No data available       Home Medications    Prior to Admission medications   Medication Sig Start Date End Date Taking? Authorizing Provider  apixaban (ELIQUIS) 5 MG TABS tablet Take 5 mg by mouth 2 (two) times daily. For DVT   Yes Historical Provider, MD  Artificial Saliva (ACT DRY MOUTH) LOZG Use as directed in the mouth or throat as needed (for dry mouth).   Yes Historical Provider, MD  aspirin EC 81 MG tablet Take 81 mg by mouth at bedtime.    Yes Historical Provider, MD  calcium-vitamin D (OSCAL WITH D) 500-200 MG-UNIT per tablet Take 1 tablet by mouth 3 (three) times daily.   Yes Historical Provider, MD  carboxymethylcellulose 1 % ophthalmic solution Apply 1 drop to eye 3 (three) times daily. Both eyes   Yes Historical Provider, MD  cetirizine (ZYRTEC) 10 MG tablet Take 10 mg by mouth daily.    Yes Historical Provider, MD  Cholecalciferol (VITAMIN D) 2000 UNITS CAPS Take 1 capsule (2,000 Units total) by mouth daily. 11/21/13  Yes [name], NP  Cranberry 200 MG CAPS Take 2 capsules by mouth 2 (two) times daily.    Yes Historical Provider, MD  denosumab (PROLIA) 60 MG/ML SOLN injection Inject 60 mg into the skin every 6 (six) months. Administer in upper arm, thigh, or abdomen   Yes Historical Provider, MD  diclofenac sodium (VOLTAREN) 1 % GEL Apply 2 g topically 2 (two) times daily. For shoulder and wrist pain   Yes Historical Provider, MD  esomeprazole (NEXIUM) 40 MG capsule Take 40 mg by mouth 2 (two) times daily before a meal.    Yes Historical Provider, MD  ferrous sulfate 325 (65 FE) MG tablet Take 325 mg by mouth 3 (three) times daily  with meals.    Yes Historical Provider, MD  hydrochlorothiazide (HYDRODIURIL) 12.5 MG tablet Take 1 tablet (12.5 mg total) by mouth daily. 01/14/16  Yes Dinah C Ngetich, NP  HYDROcodone-acetaminophen (NORCO/VICODIN) 5-325 MG tablet Take one tablet by mouth twice daily for pain. Do not exceed 3gm of Tylenol in 24 hours Patient taking differently: Take 1 tablet by mouth 2 (two) times daily. Take one tablet by mouth twice daily for pain. Do not exceed 3gm of Tylenol in 24 hours 08/01/16  Yes [place], DO  ipratropium (ATROVENT) 0.03 % nasal spray Place 2 sprays into both nostrils daily as needed (For asthma).    Yes Historical Provider, MD  ipratropium-albuterol (DUONEB) 0.5-2.5 (3) MG/3ML SOLN Take 3 mLs by nebulization every 8 (eight) hours as needed (wheezing).    Yes Historical Provider, MD  levothyroxine (SYNTHROID, LEVOTHROID) 50 MCG tablet Take 50 mcg by mouth daily.    Yes Historical Provider, MD  lidocaine (LIDODERM) 5 % Place 1 patch onto the skin daily. Remove & Discard patch within 12 hours or as directed by MD 04/04/15  Yes Lauree Chandler, NP  LORazepam (ATIVAN) 0.5 MG tablet Take one tablet by mouth every night at bedtime for anxiety Patient taking differently: Take 0.5 mg by mouth at bedtime. Take one tablet by mouth every night at bedtime for anxiety 07/24/16  Yes Tiffany L Reed, DO  meclizine (ANTIVERT) 12.5 MG tablet Take 12.5 mg by mouth 2 (two) times daily.    Yes Historical Provider, MD  Menthol, Topical Analgesic, (BIOFREEZE) 4 % GEL Apply 1 application topically every 8 (eight) hours. For wrist pain   Yes Historical Provider, MD  methocarbamol (ROBAXIN) 500 MG tablet Take 500 mg by mouth at bedtime. For muscle spasms/shoulder pain.   Yes Historical Provider, MD  metoprolol (LOPRESSOR) 50 MG tablet Take 50 mg by mouth 2 (two) times daily.    Yes Historical Provider, MD  montelukast (SINGULAIR) 10 MG tablet Take 10 mg by mouth at bedtime.    Yes Historical Provider, MD  Multiple  Vitamins-Minerals (CERTAVITE/ANTIOXIDANTS PO) Take 1 tablet by mouth daily.    Yes Historical Provider, MD  nitrofurantoin, macrocrystal-monohydrate, (MACROBID) 100 MG capsule Take 100 mg by mouth daily.  05/11/16  Yes Historical Provider, MD  nitroGLYCERIN (NITROSTAT) 0.4 MG SL tablet Place 0.4 mg under the tongue every 5 (five) minutes as needed for chest pain.   Yes Historical Provider, MD  Omega-3 Fatty Acids (FISH OIL) 1000 MG CPDR Take 1,000 mg by mouth daily.   Yes Historical Provider, MD  ondansetron (ZOFRAN) 4 MG tablet Take 4 mg by mouth every 6 (six) hours as needed for nausea or vomiting.   Yes Historical Provider, MD  OXYGEN Inhale 2 L into the lungs as needed (To maintain saturations >90%).    Yes Historical Provider, MD  PATADAY 0.2 % SOLN Place 1 drop into both eyes daily.  03/26/15  Yes Historical Provider, MD  predniSONE (DELTASONE) 10 MG tablet Take 10 mg by mouth daily. For RA 03/08/15  Yes Historical Provider, MD  promethazine (PHENERGAN) 25 MG tablet Take 25 mg by mouth every 12 (twelve) hours as needed for nausea or vomiting.   Yes Historical Provider, MD  rosuvastatin (CRESTOR) 20 MG tablet Take 20 mg by mouth every evening. For hyperlipidemia 07/05/13  Yes [name], NP  sertraline (ZOLOFT) 50 MG tablet Take 50 mg by mouth every morning. For depression   Yes Historical Provider, MD    Family History Family History  Problem Relation Age of Onset  . Kidney disease Mother   . Kidney disease Brother   . Lung cancer Father     Social History Social History  Substance Use Topics  . Smoking status: Never Smoker  . Smokeless tobacco: Never Used  . Alcohol use No     Allergies   Codeine; Sulfa antibiotics; Biphosphate; Morphine and related; Percocet [oxycodone-acetaminophen]; and Plavix [clopidogrel bisulfate]   Review of Systems Review of Systems  Unable to perform ROS: Mental status change  Constitutional:  Positive for fever.     Physical Exam Updated  Vital Signs BP 112/75   Pulse 92   Temp (!) 101.9 F (38.8 C) (Rectal)   Resp 19   Wt 68 kg   LMP  (LMP Unknown)   SpO2 97%   BMI 24.21 kg/m   Physical Exam  Constitutional: She is oriented to person, place, and time. She appears well-developed and well-nourished.  Patient was alert and oriented 3 on my exam. However was a poor historian and does not answer appropriately to many questions being asked during assessment.  HENT:  Head: Normocephalic and atraumatic.  Nose: Nose normal.  Mouth/Throat: Oropharynx is clear and moist.  Eyes: Conjunctivae and EOM are normal. Pupils are equal, round, and reactive to light.  Neck: Normal range of motion. No JVD present. No tracheal deviation present.  Cardiovascular: Normal rate, normal heart sounds and intact distal pulses.   Intact distal pulses to all 4 extremities  Pulmonary/Chest: Effort normal and breath sounds normal. No respiratory distress. She has no wheezes. She has no rales.  Normal work of breathing. No respiratory distress noted.   Abdominal: Soft. There is no tenderness. There is no rebound and no guarding.  Soft and nontender. No rebound tenderness or guarding.   Musculoskeletal: Normal range of motion.  Left lower arm with evidence of swelling, redness, tender palpation maximally over the wrist.   Neurological: She is alert and oriented to person, place, and time.  Cranial Nerves:  III,IV, VI: ptosis not present, extra-ocular movements intact bilaterally, direct and consensual pupillary light reflexes intact bilaterally V: facial sensation, jaw opening, and bite strength equal bilaterally VII: eyebrow raise, eyelid close, smile, frown, pucker equal bilaterally VIII: hearing grossly normal bilaterally  IX,X: palate elevation and swallowing intact XI: bilateral shoulder shrug and lateral head rotation equal and strong XII: midline tongue extension   Sensory intact.  Muscle strength 5/5, muscle strength 4/5 to left side.     Skin: Skin is warm.  Psychiatric: She has a normal mood and affect. Her behavior is normal.  Nursing note and vitals reviewed.    ED Treatments / Results  Labs (all labs ordered are listed, but only abnormal results are displayed) Labs Reviewed  COMPREHENSIVE METABOLIC PANEL - Abnormal; Notable for the following:       Result Value   Glucose, Bld 141 (*)    BUN 27 (*)    Creatinine, Ser 1.88 (*)    Calcium 8.8 (*)    Total Protein 5.2 (*)    Albumin 2.7 (*)    GFR calc non Af Amer 23 (*)    GFR calc Af Amer 27 (*)    All other components within normal limits  CBC WITH DIFFERENTIAL/PLATELET - Abnormal; Notable for the following:    WBC 15.2 (*)    RBC 3.75 (*)    Hemoglobin 10.4 (*)    HCT 33.4 (*)    RDW 16.4 (*)    Platelets 133 (*)    Neutro Abs 12.7 (*)    Monocytes Absolute 1.2 (*)    All other components within normal limits  PROTIME-INR - Abnormal; Notable for the following:    Prothrombin Time 22.9 (*)    All other components within normal limits  CULTURE, BLOOD (ROUTINE X 2)  CULTURE, BLOOD (ROUTINE X 2)  URINALYSIS, ROUTINE W REFLEX MICROSCOPIC  I-STAT CG4 LACTIC ACID, ED    EKG  EKG Interpretation  Date/Time:  Monday September 15 2016 13:40:27 EDT Ventricular  Rate:  90 PR Interval:    QRS Duration: 91 QT Interval:  379 QTC Calculation: 464 R Axis:   30 Text Interpretation:  Sinus rhythm Minimal ST elevation, anterior leads baseline wander but otherwise no significant changes since prior ekg Confirmed by [facility]  MD, MARTHA [id number]) on 09/15/2016 2:48:28 PM       Radiology Dg Chest 2 View  Result Date: 09/15/2016 CLINICAL DATA:  Fever. EXAM: CHEST  2 VIEW COMPARISON:  Radiograph of July 23, 2015. FINDINGS: Stable cardiomediastinal silhouette. No pneumothorax or pleural effusion is noted. Status post surgical posterior fusion of thoracic spine. No acute pulmonary disease is noted. Stable hiatal hernia. IMPRESSION: No active cardiopulmonary disease.   Stable hiatal hernia. Electronically Signed   By: Marijo Conception, M.D.   On: 09/15/2016 14:59    Procedures Procedures (including critical care time)  Medications Ordered in ED Medications  acetaminophen (TYLENOL) tablet 650 mg (650 mg Oral Given 09/15/16 1545)  sodium chloride 0.9 % bolus 1,000 mL (1,000 mLs Intravenous New Bag/Given 09/15/16 1604)     Initial Impression / Assessment and Plan / ED Course  I have reviewed the triage vital signs and the nursing notes.  Pertinent labs & imaging results that were available during my care of the patient were reviewed by me and considered in my medical decision making (see chart for details).    Patient here with concerning sepsis. She is alert and oriented 3 on my assessment however does sometimes answer inappropriately. Poor historian.  Pt does have hx of dementia but daughter states she is more confused in the last 2-3 days than she normally is. She states this is similar to her previous UTI's. PT is febrile, hemodynamically stable, in no apparent distress. Heart and lung sounds are clear. Abdomen soft and nontender. Patient's lower left arm appears to have swelling, redness, tenderness maximally over the left wrist. This may be consistent with cellulitis, however there is no open source of infection visualized. Lab work shows mild increase in creatinine, mild decrease in GFR. Patient has increased WBC. Other lab work is similar to previous findings. CXR is negative for cardiopulmonary disease.   Awaiting UA.   Pt seen and evaluated by Dr. Canary Brim who agreed with assessment and plan.   Pt given tylenol and 1 L of fluids.   At shift change care was transferred to Janetta Hora, PA-C who will follow pending studies, re-evaulate and determine disposition.  Awaiting results of UA. I anticipate admission due to patient's fever, elevated HR, elevated WBC and possible source of infection in her urine and/or her left wrist.    Final Clinical  Impressions(s) / ED Diagnoses   Final diagnoses:  None    New Prescriptions New Prescriptions   No medications on file     [place], [place] 09/15/16 [place], MD 09/16/16 [phone]

## 2016-09-15 NOTE — ED Notes (Signed)
Pt tolerated the in and out cath well °

## 2016-09-15 NOTE — Progress Notes (Signed)
ANTICOAGULATION CONSULT NOTE - Initial Consult  Pharmacy Consult for IV heparin Indication: hx of  DVT  Allergies  Allergen Reactions  . Codeine Swelling  . Sulfa Antibiotics Other (See Comments)    GI upset  . Biphosphate Other (See Comments)  . Morphine And Related Other (See Comments)  . Percocet [Oxycodone-Acetaminophen] Other (See Comments)  . Plavix [Clopidogrel Bisulfate] Itching and Rash    Patient Measurements: Weight: 150 lb (68 kg) Heparin Dosing Weight: 68 kg  Vital Signs: Temp: 98.3 F (36.8 C) (03/19 2106) Temp Source: Oral (03/19 2106) BP: 125/60 (03/19 2106) Pulse Rate: 85 (03/19 2106)  Labs:  Recent Labs  09/15/16 1346  HGB 10.4*  HCT 33.4*  PLT 133*  LABPROT 22.9*  INR 1.99  CREATININE 1.88*    Estimated Creatinine Clearance (by C-G formula based on SCr of 1.88 mg/dL (H)) Female: 20.9 mL/min (A) Female: 26.4 mL/min (A)   Medical History: Past Medical History:  Diagnosis Date  . Anxiety   . Arthritis   . Chronic diastolic CHF (congestive heart failure) ([place])    a. 10/2014 Echo: EF 60-65%, mild LVH, grade 1 DD, dynamic obstruction @ rest - peak velocity of 271 cm/sec, peak grad of 85mmHg, PASP 18mmHg.  Marland Kitchen Coronary artery disease    a. 2012 s/p NSTEMI/Cath: 95% apical LAD dzs, otw nonobs dzs-->Med Rx;  b. 05/2011 MV: no ischemia, EF 79%, inf infarct- attenuation.  . CVA (cerebral vascular accident) ([place])   . Depression   . DVT of lower extremity (deep venous thrombosis) ([place])   . Dyspnea   . Elevated troponin    a. 10/2014 in setting of sepsis/?HCAP.  Marland Kitchen Enteritis due to Clostridium difficile 01/04/2013  . Essential hypertension   . Fall   . Fatigue   . GERD (gastroesophageal reflux disease)   . GI bleed 2005  . HCAP (healthcare-associated pneumonia) 01/22/2013  . Hemorrhoids   . Hiatal hernia   . Hyperlipidemia   . Hypothyroidism   . Malnutrition ([place])   . Obstructive hypertrophic cardiomyopathy ([place])    a. 10/2014 Echo: EF 60-65%, mild  LVH, grade 1 DD, dynamic obstruction @ rest - peak velocity of 271 cm/sec, peak grad of 64mmHg, PASP 62mmHg.  . Osteoporosis   . PUD (peptic ulcer disease)   . RA (rheumatoid arthritis) ([place])   . Recurrent colitis due to Clostridium difficile 06/02/2012  . Renal insufficiency   . Right ear pain   . Sepsis ([place]) 12/19/2012  . TIA (transient ischemic attack)   . Weight loss     Assessment: 86 YOF from SNF, on Eliquis PTA for hx of DVT, presented with fever and AMS, pharmacy is consulted to start IV heparin. Last dose Eliquis this morning at 0930.  hgb 104, pltc 133K  Goal of Therapy:  APTT 66-102  Heparin level 0.3-0.7 units/ml Monitor platelets by anticoagulation protocol: Yes   Pharmacy is also consulted to dose vancomycin and zosyn for r/o sepsis. Tm 101.9, wbc 15.2, AKI, scr 1.88, elevated from baseline (1.2-1.3), est. crcl ~ 20-30 ml/min. She received vancomycin 1g and zosyn 3.375g at 1900   Vancomycin 3/19 >> Zosyn 3/19 >>  3/19 blood x 2 3/19 Urine   Plan:  Heparin infusion 1050 units/hr with no bolus  f/u aPTT in AM Vancomycin 750 mg IV Q 24 hr next dose tomorrow at 1800 Zosyn 3.375g IV Q 8 hrs (4 hr infusion), next dose at 02000 Monitor renal function and f/u cultures  Manley Mason 09/15/2016,10:07 PM

## 2016-09-15 NOTE — ED Notes (Signed)
Care handoff to [place], RN 

## 2016-09-15 NOTE — ED Triage Notes (Signed)
PT sent from nursing facility for [facility] for "since the weekend". EMS reports PT feels febrile to touch and is alert to self. PT is alert and oriented x3 during triage. PT disoriented to time only. PT able to answer questions. PT has been septic in the past. Left arm tender and swollen. PT febrile at 101.9 rectal

## 2016-09-15 NOTE — ED Provider Notes (Signed)
Patient signed out to me by F Espina PA-C. She has had worsening confusion from baseline for the past 2 days and a fever, N/V. She has mild cellulitis of the left wrist. UA is pending. Will admit for IV antibiotics.  UA has rare bacteria, moderate leuks, TNTC WBC. Culture sent. 30cc/kg fluids ordered and broad spectrum antibiotics to cover multiple sources. Spoke with Dr. Hal Hope who will admit.     Recardo Evangelist, PA-C 09/15/16 [place], MD 09/17/16 1140

## 2016-09-15 NOTE — H&P (Addendum)
History and Physical    [name] MCN:[id number] DOB: 04/26/1930 DOA: 09/15/2016  PCP: [name], MD  Patient coming from: Nursing home.  History of pain from patient's daughter.  Chief Complaint: Confusion fever and chills.  HPI: [name] is a 83 y.o. female with hypertrophic cardiomyopathy, chronic diastolic heart failure, CAD, hyperlipidemia, and essential hypertension, rheumatoid arthritis and hypothyroidism was brought to the ER after patient was found to be getting confused with persistent fever chills. As per the daughter patient was noted to have increasing swelling of the left wrist last 3 days. Today patient was found to be confused and had one episode of nausea vomiting. 2 weeks ago patient had a fall at the nursing home which was unwitnessed.   ED Course: Patient was found to be febrile and tachycardic and UA shows features consistent with UTI. On exam patient has left wrist swelling with no definite abscess palpable. Patient is being admitted for sepsis most likely from UTI and possible cellulitis. Chest x-ray was unremarkable. CT of the head was showing nothing acute.  Review of Systems: As per HPI, rest all negative.   Past Medical History:  Diagnosis Date  . Anxiety   . Arthritis   . Chronic diastolic CHF (congestive heart failure) ([place])    a. 10/2014 Echo: EF 60-65%, mild LVH, grade 1 DD, dynamic obstruction @ rest - peak velocity of 271 cm/sec, peak grad of 39mmHg, PASP 36mmHg.  Marland Kitchen Coronary artery disease    a. 2012 s/p NSTEMI/Cath: 95% apical LAD dzs, otw nonobs dzs-->Med Rx;  b. 05/2011 MV: no ischemia, EF 79%, inf infarct- attenuation.  . CVA (cerebral vascular accident) ([place])   . Depression   . DVT of lower extremity (deep venous thrombosis) ([place])   . Dyspnea   . Elevated troponin    a. 10/2014 in setting of sepsis/?HCAP.  Marland Kitchen Enteritis due to Clostridium difficile 01/04/2013  . Essential hypertension   . Fall   . Fatigue   . GERD (gastroesophageal  reflux disease)   . GI bleed 2005  . HCAP (healthcare-associated pneumonia) 01/22/2013  . Hemorrhoids   . Hiatal hernia   . Hyperlipidemia   . Hypothyroidism   . Malnutrition ([place])   . Obstructive hypertrophic cardiomyopathy ([place])    a. 10/2014 Echo: EF 60-65%, mild LVH, grade 1 DD, dynamic obstruction @ rest - peak velocity of 271 cm/sec, peak grad of 71mmHg, PASP 60mmHg.  . Osteoporosis   . PUD (peptic ulcer disease)   . RA (rheumatoid arthritis) ([place])   . Recurrent colitis due to Clostridium difficile 06/02/2012  . Renal insufficiency   . Right ear pain   . Sepsis ([place]) 12/19/2012  . TIA (transient ischemic attack)   . Weight loss     Past Surgical History:  Procedure Laterality Date  . ANKLE SURGERY    . BACK SURGERY    . [place]   x3  . CARDIAC CATHETERIZATION     SHOWED RUPTURE PLAQUE IN THE LAD. THE LAD IS NONOBSTRUCTIVE WITH ONLY 30-40% STENOSIS  . COLONOSCOPY    . KNEE SURGERY    . NSTEMI  06/2010  . SPINAL FUSION SURGERY       reports that she has never smoked. She has never used smokeless tobacco. She reports that she does not drink alcohol or use drugs.  Allergies  Allergen Reactions  . Codeine Swelling  . Sulfa Antibiotics Other (See Comments)    GI upset  . Biphosphate Other (See Comments)  .  Morphine And Related Other (See Comments)  . Percocet [Oxycodone-Acetaminophen] Other (See Comments)  . Plavix [Clopidogrel Bisulfate] Itching and Rash    Family History  Problem Relation Age of Onset  . Kidney disease Mother   . Kidney disease Brother   . Lung cancer Father     Prior to Admission medications   Medication Sig Start Date End Date Taking? Authorizing Provider  apixaban (ELIQUIS) 5 MG TABS tablet Take 5 mg by mouth 2 (two) times daily. For DVT   Yes Historical Provider, MD  Artificial Saliva (ACT DRY MOUTH) LOZG Use as directed in the mouth or throat as needed (for dry mouth).   Yes Historical Provider, MD  aspirin EC 81 MG tablet  Take 81 mg by mouth at bedtime.    Yes Historical Provider, MD  calcium-vitamin D (OSCAL WITH D) 500-200 MG-UNIT per tablet Take 1 tablet by mouth 3 (three) times daily.   Yes Historical Provider, MD  carboxymethylcellulose 1 % ophthalmic solution Apply 1 drop to eye 3 (three) times daily. Both eyes   Yes Historical Provider, MD  cetirizine (ZYRTEC) 10 MG tablet Take 10 mg by mouth daily.    Yes Historical Provider, MD  Cholecalciferol (VITAMIN D) 2000 UNITS CAPS Take 1 capsule (2,000 Units total) by mouth daily. 11/21/13  Yes [name], NP  Cranberry 200 MG CAPS Take 2 capsules by mouth 2 (two) times daily.    Yes Historical Provider, MD  denosumab (PROLIA) 60 MG/ML SOLN injection Inject 60 mg into the skin every 6 (six) months. Administer in upper arm, thigh, or abdomen   Yes Historical Provider, MD  diclofenac sodium (VOLTAREN) 1 % GEL Apply 2 g topically 2 (two) times daily. For shoulder and wrist pain   Yes Historical Provider, MD  esomeprazole (NEXIUM) 40 MG capsule Take 40 mg by mouth 2 (two) times daily before a meal.    Yes Historical Provider, MD  ferrous sulfate 325 (65 FE) MG tablet Take 325 mg by mouth 3 (three) times daily with meals.    Yes Historical Provider, MD  hydrochlorothiazide (HYDRODIURIL) 12.5 MG tablet Take 1 tablet (12.5 mg total) by mouth daily. 01/14/16  Yes Dinah C Ngetich, NP  HYDROcodone-acetaminophen (NORCO/VICODIN) 5-325 MG tablet Take one tablet by mouth twice daily for pain. Do not exceed 3gm of Tylenol in 24 hours Patient taking differently: Take 1 tablet by mouth 2 (two) times daily. Take one tablet by mouth twice daily for pain. Do not exceed 3gm of Tylenol in 24 hours 08/01/16  Yes Tiffany L Reed, DO  ipratropium (ATROVENT) 0.03 % nasal spray Place 2 sprays into both nostrils daily as needed (For asthma).    Yes Historical Provider, MD  ipratropium-albuterol (DUONEB) 0.5-2.5 (3) MG/3ML SOLN Take 3 mLs by nebulization every 8 (eight) hours as needed (wheezing).     Yes Historical Provider, MD  levothyroxine (SYNTHROID, LEVOTHROID) 50 MCG tablet Take 50 mcg by mouth daily.    Yes Historical Provider, MD  lidocaine (LIDODERM) 5 % Place 1 patch onto the skin daily. Remove & Discard patch within 12 hours or as directed by MD 04/04/15  Yes Lauree Chandler, NP  LORazepam (ATIVAN) 0.5 MG tablet Take one tablet by mouth every night at bedtime for anxiety Patient taking differently: Take 0.5 mg by mouth at bedtime. Take one tablet by mouth every night at bedtime for anxiety 07/24/16  Yes Tiffany L Reed, DO  meclizine (ANTIVERT) 12.5 MG tablet Take 12.5 mg by mouth 2 (  two) times daily.    Yes Historical Provider, MD  Menthol, Topical Analgesic, (BIOFREEZE) 4 % GEL Apply 1 application topically every 8 (eight) hours. For wrist pain   Yes Historical Provider, MD  methocarbamol (ROBAXIN) 500 MG tablet Take 500 mg by mouth at bedtime. For muscle spasms/shoulder pain.   Yes Historical Provider, MD  metoprolol (LOPRESSOR) 50 MG tablet Take 50 mg by mouth 2 (two) times daily.    Yes Historical Provider, MD  montelukast (SINGULAIR) 10 MG tablet Take 10 mg by mouth at bedtime.    Yes Historical Provider, MD  Multiple Vitamins-Minerals (CERTAVITE/ANTIOXIDANTS PO) Take 1 tablet by mouth daily.    Yes Historical Provider, MD  nitrofurantoin, macrocrystal-monohydrate, (MACROBID) 100 MG capsule Take 100 mg by mouth daily.  05/11/16  Yes Historical Provider, MD  nitroGLYCERIN (NITROSTAT) 0.4 MG SL tablet Place 0.4 mg under the tongue every 5 (five) minutes as needed for chest pain.   Yes Historical Provider, MD  Omega-3 Fatty Acids (FISH OIL) 1000 MG CPDR Take 1,000 mg by mouth daily.   Yes Historical Provider, MD  ondansetron (ZOFRAN) 4 MG tablet Take 4 mg by mouth every 6 (six) hours as needed for nausea or vomiting.   Yes Historical Provider, MD  OXYGEN Inhale 2 L into the lungs as needed (To maintain saturations >90%).    Yes Historical Provider, MD  PATADAY 0.2 % SOLN Place 1  drop into both eyes daily.  03/26/15  Yes Historical Provider, MD  predniSONE (DELTASONE) 10 MG tablet Take 10 mg by mouth daily. For RA 03/08/15  Yes Historical Provider, MD  promethazine (PHENERGAN) 25 MG tablet Take 25 mg by mouth every 12 (twelve) hours as needed for nausea or vomiting.   Yes Historical Provider, MD  rosuvastatin (CRESTOR) 20 MG tablet Take 20 mg by mouth every evening. For hyperlipidemia 07/05/13  Yes [name], NP  sertraline (ZOLOFT) 50 MG tablet Take 50 mg by mouth every morning. For depression   Yes Historical Provider, MD    Physical Exam: Vitals:   09/15/16 1945 09/15/16 2015 09/15/16 2030 09/15/16 2106  BP:  128/60 (!) 116/55 125/60  Pulse: 92 89 86 85  Resp: (!) 21 19 20 18   Temp:    98.3 F (36.8 C)  TempSrc:    Oral  SpO2: 96% 93% 95% 95%  Weight:          Constitutional: Moderately built and nourished. Vitals:   09/15/16 1945 09/15/16 2015 09/15/16 2030 09/15/16 2106  BP:  128/60 (!) 116/55 125/60  Pulse: 92 89 86 85  Resp: (!) 21 19 20 18   Temp:    98.3 F (36.8 C)  TempSrc:    Oral  SpO2: 96% 93% 95% 95%  Weight:       Eyes: Anicteric no pallor. ENMT: No discharge from the ears eyes nose or mouth. Neck: No neck rigidity no mass felt. Respiratory: No rhonchi or crepitations. Cardiovascular: S1-S2 heard no murmurs appreciated. Abdomen: Soft nontender bowel sounds present. Musculoskeletal: Left wrist swelling. Skin: Mild erythema of the left wrist. Neurologic: Alert awake oriented to time place and person and moves all extremities. Psychiatric: Appears normal.   Labs on Admission: I have personally reviewed following labs and imaging studies  CBC:  Recent Labs Lab 09/15/16 1346  WBC 15.2*  NEUTROABS 12.7*  HGB 10.4*  HCT 33.4*  MCV 89.1  PLT 132*   Basic Metabolic Panel:  Recent Labs Lab 09/15/16 1346  NA 139  K 3.8  CL 105  CO2 24  GLUCOSE 141*  BUN 27*  CREATININE 1.88*  CALCIUM 8.8*   GFR: Estimated  Creatinine Clearance (by C-G formula based on SCr of 1.88 mg/dL (H)) Female: 20.9 mL/min (A) Female: 26.4 mL/min (A) Liver Function Tests:  Recent Labs Lab 09/15/16 1346  AST 32  ALT 17  ALKPHOS 43  BILITOT 0.6  PROT 5.2*  ALBUMIN 2.7*   No results for input(s): LIPASE, AMYLASE in the last 168 hours. No results for input(s): AMMONIA in the last 168 hours. Coagulation Profile:  Recent Labs Lab 09/15/16 1346  INR 1.99   Cardiac Enzymes: No results for input(s): CKTOTAL, CKMB, CKMBINDEX, TROPONINI in the last 168 hours. BNP (last 3 results) No results for input(s): PROBNP in the last 8760 hours. HbA1C: No results for input(s): HGBA1C in the last 72 hours. CBG: No results for input(s): GLUCAP in the last 168 hours. Lipid Profile: No results for input(s): CHOL, HDL, LDLCALC, TRIG, CHOLHDL, LDLDIRECT in the last 72 hours. Thyroid Function Tests: No results for input(s): TSH, T4TOTAL, FREET4, T3FREE, THYROIDAB in the last 72 hours. Anemia Panel: No results for input(s): VITAMINB12, FOLATE, FERRITIN, TIBC, IRON, RETICCTPCT in the last 72 hours. Urine analysis:    Component Value Date/Time   COLORURINE YELLOW 09/15/2016 1732   APPEARANCEUR HAZY (A) 09/15/2016 1732   APPEARANCEUR Hazy 03/06/2013 1013   LABSPEC 1.019 09/15/2016 1732   LABSPEC 1.013 03/06/2013 1013   PHURINE 5.0 09/15/2016 1732   GLUCOSEU NEGATIVE 09/15/2016 1732   GLUCOSEU Negative 03/06/2013 1013   HGBUR NEGATIVE 09/15/2016 1732   BILIRUBINUR NEGATIVE 09/15/2016 1732   BILIRUBINUR Negative 03/06/2013 1013   KETONESUR NEGATIVE 09/15/2016 1732   PROTEINUR 30 (A) 09/15/2016 1732   UROBILINOGEN 0.2 12/18/2012 1203   NITRITE NEGATIVE 09/15/2016 1732   LEUKOCYTESUR MODERATE (A) 09/15/2016 1732   LEUKOCYTESUR 3+ 03/06/2013 1013   Sepsis Labs: @LABRCNTIP (procalcitonin:4,lacticidven:4) )No results found for this or any previous visit (from the past 240 hour(s)).   Radiological Exams on Admission: Dg Chest 2  View  Result Date: 09/15/2016 CLINICAL DATA:  Fever. EXAM: CHEST  2 VIEW COMPARISON:  Radiograph of July 23, 2015. FINDINGS: Stable cardiomediastinal silhouette. No pneumothorax or pleural effusion is noted. Status post surgical posterior fusion of thoracic spine. No acute pulmonary disease is noted. Stable hiatal hernia. IMPRESSION: No active cardiopulmonary disease.  Stable hiatal hernia. Electronically Signed   By: Marijo Conception, M.D.   On: 09/15/2016 14:59    EKG: Independently reviewed. Normal sinus rhythm. Nonspecific ST changes.  Assessment/Plan Principal Problem:   Sepsis ([place]) Active Problems:   RA (rheumatoid arthritis) ([facility])   Obstructive hypertrophic cardiomyopathy ([facility])   Chronic diastolic CHF (congestive heart failure) ([facility])   Pulmonary hypertension   CKD (chronic kidney disease) stage 3, GFR 30-59 ml/min   UTI (urinary tract infection)   Wrist swelling, left   Acute encephalopathy   Pain and swelling of left wrist    1. Sepsis probably from UTI and possible cellulitis of the left wrist - patient is placed on empiric antibiotics. Follow blood cultures urine cultures. Check x-ray of the left wrist. Continue with hydration. 2. Obstructive hypertrophic cardiomyopathy - continue metoprolol. 3. Chronic kidney disease stage III with mild worsening of creatinine - hold hydrochlorothiazide for now and continue hydration. 4. Rheumatoid arthritis on prednisone - 1 dose of hydrocortisone stress dose was given. Continue prednisone. 5. Fall - patient appears weak. Check CT of the neck and also x-ray of the pelvis since patient  is complaining of low back pain. 6. History of DVT - for now we will keep patient on heparin and hold off anticoagulants until we make sure there is no procedures planned. 7. Hypothyroidism on Synthroid.   DVT prophylaxis: Heparin. Code Status: Full code.  Family Communication: Patient's daughter.  Disposition Plan: Nursing home.  Consults called: None.   Admission status: Inpatient.    Rise Patience MD [facility] Pager [phone].  If 7PM-7AM, please contact night-coverage [email or web address] Password [facility]  09/15/2016, 9:56 PM

## 2016-09-16 DIAGNOSIS — G9340 Encephalopathy, unspecified: Secondary | ICD-10-CM

## 2016-09-16 DIAGNOSIS — N179 Acute kidney failure, unspecified: Secondary | ICD-10-CM

## 2016-09-16 DIAGNOSIS — N189 Chronic kidney disease, unspecified: Secondary | ICD-10-CM

## 2016-09-16 LAB — GLUCOSE, CAPILLARY
Glucose-Capillary: 122 mg/dL — ABNORMAL HIGH (ref 65–99)
Glucose-Capillary: 105 mg/dL — ABNORMAL HIGH (ref 65–99)

## 2016-09-16 LAB — CBC WITH DIFFERENTIAL/PLATELET
BASOS ABS: 0 10*3/uL (ref 0.0–0.1)
BASOS PCT: 0 %
EOS PCT: 0 %
Eosinophils Absolute: 0 10*3/uL (ref 0.0–0.7)
HCT: 30 % — ABNORMAL LOW (ref 36.0–46.0)
Hemoglobin: 9.4 g/dL — ABNORMAL LOW (ref 12.0–15.0)
LYMPHS PCT: 5 %
Lymphs Abs: 0.5 10*3/uL — ABNORMAL LOW (ref 0.7–4.0)
MCH: 27.7 pg (ref 26.0–34.0)
MCHC: 31.3 g/dL (ref 30.0–36.0)
MCV: 88.5 fL (ref 78.0–100.0)
MONO ABS: 0.7 10*3/uL (ref 0.1–1.0)
MONOS PCT: 7 %
Neutro Abs: 8.8 10*3/uL — ABNORMAL HIGH (ref 1.7–7.7)
Neutrophils Relative %: 88 %
PLATELETS: 127 10*3/uL — AB (ref 150–400)
RBC: 3.39 MIL/uL — ABNORMAL LOW (ref 3.87–5.11)
RDW: 16.3 % — AB (ref 11.5–15.5)
WBC: 10.1 10*3/uL (ref 4.0–10.5)

## 2016-09-16 LAB — COMPREHENSIVE METABOLIC PANEL
ALT: 14 U/L (ref 14–54)
ANION GAP: 11 (ref 5–15)
AST: 25 U/L (ref 15–41)
Albumin: 2.2 g/dL — ABNORMAL LOW (ref 3.5–5.0)
Alkaline Phosphatase: 44 U/L (ref 38–126)
BILIRUBIN TOTAL: 0.6 mg/dL (ref 0.3–1.2)
BUN: 23 mg/dL — AB (ref 6–20)
CALCIUM: 8.3 mg/dL — AB (ref 8.9–10.3)
CHLORIDE: 105 mmol/L (ref 101–111)
CO2: 25 mmol/L (ref 22–32)
CREATININE: 1.68 mg/dL — AB (ref 0.44–1.00)
GFR calc Af Amer: 31 mL/min — ABNORMAL LOW (ref >60)
GFR, EST NON AFRICAN AMERICAN: 27 mL/min — AB (ref >60)
Glucose, Bld: 143 mg/dL — ABNORMAL HIGH (ref 65–99)
POTASSIUM: 4 mmol/L (ref 3.5–5.1)
Sodium: 141 mmol/L (ref 135–145)
TOTAL PROTEIN: 4.7 g/dL — AB (ref 6.5–8.1)

## 2016-09-16 LAB — INFLUENZA PANEL BY PCR (TYPE A & B)
INFLAPCR: NEGATIVE
INFLBPCR: NEGATIVE

## 2016-09-16 LAB — LACTIC ACID, PLASMA: LACTIC ACID, VENOUS: 1.2 mmol/L (ref 0.5–1.9)

## 2016-09-16 LAB — SEDIMENTATION RATE: SED RATE: 35 mm/h — AB (ref 0–22)

## 2016-09-16 LAB — TSH: TSH: 0.251 u[IU]/mL — AB (ref 0.350–4.500)

## 2016-09-16 LAB — APTT: aPTT: >200 seconds (ref 24–36)

## 2016-09-16 LAB — URIC ACID: URIC ACID, SERUM: 6.3 mg/dL (ref 2.3–6.6)

## 2016-09-16 LAB — TROPONIN I: Troponin I: 0.09 ng/mL (ref <0.03)

## 2016-09-16 LAB — PROCALCITONIN: PROCALCITONIN: 1.48 ng/mL

## 2016-09-16 LAB — HEPARIN LEVEL (UNFRACTIONATED)

## 2016-09-16 MED ORDER — IPRATROPIUM-ALBUTEROL 0.5-2.5 (3) MG/3ML IN SOLN
3.0000 mL | RESPIRATORY_TRACT | Status: DC | PRN
  Administered 2016-09-17: 3 mL via RESPIRATORY_TRACT
  Filled 2016-09-16: qty 3

## 2016-09-16 MED ORDER — METHOCARBAMOL 500 MG PO TABS
500.0000 mg | ORAL_TABLET | Freq: Three times a day (TID) | ORAL | Status: DC | PRN
  Administered 2016-09-17: 500 mg via ORAL
  Filled 2016-09-16: qty 1

## 2016-09-16 MED ORDER — HEPARIN (PORCINE) IN NACL 100-0.45 UNIT/ML-% IJ SOLN: 850.0000 [IU]/h | INTRAMUSCULAR | Status: DC

## 2016-09-16 MED ORDER — ROSUVASTATIN CALCIUM 20 MG PO TABS
20.0000 mg | ORAL_TABLET | Freq: Every day | ORAL | Status: DC
  Administered 2016-09-16 – 2016-09-17 (×2): 20 mg via ORAL
  Filled 2016-09-16 (×2): qty 1

## 2016-09-16 MED ORDER — LEVOTHYROXINE SODIUM 50 MCG PO TABS
50.0000 ug | ORAL_TABLET | Freq: Every day | ORAL | Status: DC
  Administered 2016-09-17: 50 ug via ORAL
  Filled 2016-09-16: qty 1

## 2016-09-16 MED ORDER — PANTOPRAZOLE SODIUM 40 MG PO TBEC
40.0000 mg | DELAYED_RELEASE_TABLET | Freq: Two times a day (BID) | ORAL | Status: DC
  Administered 2016-09-16 – 2016-09-18 (×5): 40 mg via ORAL
  Filled 2016-09-16 (×5): qty 1

## 2016-09-16 MED ORDER — APIXABAN 5 MG PO TABS
5.0000 mg | ORAL_TABLET | Freq: Two times a day (BID) | ORAL | Status: DC
  Administered 2016-09-16 – 2016-09-18 (×5): 5 mg via ORAL
  Filled 2016-09-16 (×6): qty 1

## 2016-09-16 NOTE — Progress Notes (Signed)
[place], [place]  83 y.o female arrived on the unit via bed. Patient is alert x4.  No complaints of pain.  IV to the left forearm; saline locked.  Vital signs stable.  Skin assessment complete.  MASD under the right breast and sacrum area.  Foam applied.  Left lower forearm swollen. Educated the patient on how to contact the staff on the unit.  Activated the bed alarm, lowered the bed, and placed the call light within reach.

## 2016-09-16 NOTE — Progress Notes (Signed)
ANTICOAGULATION CONSULT NOTE - Follow Up Consult  Pharmacy Consult for Heparin >> Apixaban Indication: hx of DVT  Allergies  Allergen Reactions  . Codeine Swelling  . Sulfa Antibiotics Other (See Comments)    GI upset  . Biphosphate Other (See Comments)  . Morphine And Related Other (See Comments)  . Percocet [Oxycodone-Acetaminophen] Other (See Comments)  . Plavix [Clopidogrel Bisulfate] Itching and Rash    Patient Measurements: Weight: 145 lb 5 oz (65.9 kg)  Vital Signs: Temp: 97.8 F (36.6 C) (03/20 0547) Temp Source: Oral (03/20 0547) BP: 145/68 (03/20 0547) Pulse Rate: 73 (03/20 0547)  Labs:  Recent Labs  09/15/16 1346 09/16/16 0615  HGB 10.4* 9.4*  HCT 33.4* 30.0*  PLT 133* 127*  APTT  --  >200*  LABPROT 22.9*  --   INR 1.99  --   HEPARINUNFRC  --  >2.20*  CREATININE 1.88* 1.68*  TROPONINI  --  0.09*    Estimated Creatinine Clearance (by C-G formula based on SCr of 1.68 mg/dL (H)) Female: 23.3 mL/min (A) Female: 29.5 mL/min (A)   Medications:  Prescriptions Prior to Admission  Medication Sig Dispense Refill Last Dose  . apixaban (ELIQUIS) 5 MG TABS tablet Take 5 mg by mouth 2 (two) times daily. For DVT   09/15/2016 at 0930  . Artificial Saliva (ACT DRY MOUTH) LOZG Use as directed in the mouth or throat as needed (for dry mouth).   prn  . aspirin EC 81 MG tablet Take 81 mg by mouth at bedtime.    09/14/2016 at Unknown time  . calcium-vitamin D (OSCAL WITH D) 500-200 MG-UNIT per tablet Take 1 tablet by mouth 3 (three) times daily.   09/14/2016 at Unknown time  . carboxymethylcellulose 1 % ophthalmic solution Apply 1 drop to eye 3 (three) times daily. Both eyes   09/15/2016 at Unknown time  . cetirizine (ZYRTEC) 10 MG tablet Take 10 mg by mouth daily.    09/15/2016 at Unknown time  . Cholecalciferol (VITAMIN D) 2000 UNITS CAPS Take 1 capsule (2,000 Units total) by mouth daily. 30 capsule 11 09/14/2016 at Unknown time  . Cranberry 200 MG CAPS Take 2 capsules by  mouth 2 (two) times daily.    09/15/2016 at Unknown time  . denosumab (PROLIA) 60 MG/ML SOLN injection Inject 60 mg into the skin every 6 (six) months. Administer in upper arm, thigh, or abdomen   unknown  . diclofenac sodium (VOLTAREN) 1 % GEL Apply 2 g topically 2 (two) times daily. For shoulder and wrist pain   09/15/2016 at Unknown time  . esomeprazole (NEXIUM) 40 MG capsule Take 40 mg by mouth 2 (two) times daily before a meal.    09/15/2016 at Unknown time  . ferrous sulfate 325 (65 FE) MG tablet Take 325 mg by mouth 3 (three) times daily with meals.    09/15/2016 at Unknown time  . hydrochlorothiazide (HYDRODIURIL) 12.5 MG tablet Take 1 tablet (12.5 mg total) by mouth daily. 90 tablet 3 09/15/2016 at Unknown time  . HYDROcodone-acetaminophen (NORCO/VICODIN) 5-325 MG tablet Take one tablet by mouth twice daily for pain. Do not exceed 3gm of Tylenol in 24 hours (Patient taking differently: Take 1 tablet by mouth 2 (two) times daily. Take one tablet by mouth twice daily for pain. Do not exceed 3gm of Tylenol in 24 hours) 60 tablet 0 09/15/2016 at 0930  . ipratropium (ATROVENT) 0.03 % nasal spray Place 2 sprays into both nostrils daily as needed (For asthma).    prn  .  ipratropium-albuterol (DUONEB) 0.5-2.5 (3) MG/3ML SOLN Take 3 mLs by nebulization every 8 (eight) hours as needed (wheezing).    prn  . levothyroxine (SYNTHROID, LEVOTHROID) 50 MCG tablet Take 50 mcg by mouth daily.    09/15/2016 at Unknown time  . lidocaine (LIDODERM) 5 % Place 1 patch onto the skin daily. Remove & Discard patch within 12 hours or as directed by MD 30 patch 0 09/15/2016 at Unknown time  . LORazepam (ATIVAN) 0.5 MG tablet Take one tablet by mouth every night at bedtime for anxiety (Patient taking differently: Take 0.5 mg by mouth at bedtime. Take one tablet by mouth every night at bedtime for anxiety) 30 tablet 5 09/14/2016 at Unknown time  . meclizine (ANTIVERT) 12.5 MG tablet Take 12.5 mg by mouth 2 (two) times daily.     09/15/2016 at 0930  . Menthol, Topical Analgesic, (BIOFREEZE) 4 % GEL Apply 1 application topically every 8 (eight) hours. For wrist pain   09/15/2016 at Unknown time  . methocarbamol (ROBAXIN) 500 MG tablet Take 500 mg by mouth at bedtime. For muscle spasms/shoulder pain.   09/14/2016 at Unknown time  . metoprolol (LOPRESSOR) 50 MG tablet Take 50 mg by mouth 2 (two) times daily.    09/15/2016 at 0930  . montelukast (SINGULAIR) 10 MG tablet Take 10 mg by mouth at bedtime.    09/14/2016 at Unknown time  . Multiple Vitamins-Minerals (CERTAVITE/ANTIOXIDANTS PO) Take 1 tablet by mouth daily.    09/14/2016 at Unknown time  . nitrofurantoin, macrocrystal-monohydrate, (MACROBID) 100 MG capsule Take 100 mg by mouth daily.    09/14/2016 at Unknown time  . nitroGLYCERIN (NITROSTAT) 0.4 MG SL tablet Place 0.4 mg under the tongue every 5 (five) minutes as needed for chest pain.   prn  . Omega-3 Fatty Acids (FISH OIL) 1000 MG CPDR Take 1,000 mg by mouth daily.   09/14/2016 at Unknown time  . ondansetron (ZOFRAN) 4 MG tablet Take 4 mg by mouth every 6 (six) hours as needed for nausea or vomiting.   Past Week at Unknown time  . OXYGEN Inhale 2 L into the lungs as needed (To maintain saturations >90%).    prn  . PATADAY 0.2 % SOLN Place 1 drop into both eyes daily.    09/15/2016 at Unknown time  . predniSONE (DELTASONE) 10 MG tablet Take 10 mg by mouth daily. For RA   09/15/2016 at Unknown time  . promethazine (PHENERGAN) 25 MG tablet Take 25 mg by mouth every 12 (twelve) hours as needed for nausea or vomiting.   prn  . rosuvastatin (CRESTOR) 20 MG tablet Take 20 mg by mouth every evening. For hyperlipidemia   09/14/2016 at Unknown time  . sertraline (ZOLOFT) 50 MG tablet Take 50 mg by mouth every morning. For depression   09/15/2016 at Unknown time    Assessment: 83 yo F on Apixaban PTA for hx DVT.  Held on admission and patient started on heparin.  Per Dr. Carolin Sicks, no procedures/surgeries planned.  OK to resume  Apixaban.  Of note, pt had some AKI on admission with SCr 1.8 (baseline 1.2).  Scr trending down, 1.6 today.  Renal function ok for current apixaban dose.  Goal of Therapy:  therapeutic anticoagulation Monitor platelets by anticoagulation protocol: Yes   Plan:  Discontinue heparin and associated labs. Apixaban 5mg  PO BID - first dose now. Follow up s/sx bleeding.  [employer], Pharm.D., BCPS Clinical Pharmacist Pager [phone] 09/16/2016 10:04 AM

## 2016-09-16 NOTE — Progress Notes (Signed)
PROGRESS NOTE    [name]  JJK:[id number] DOB: December 31, 1929 DOA: 09/15/2016 PCP: [name], MD   Brief Narrative: 83 y.o. female with hypertrophic cardiomyopathy, chronic diastolic heart failure, CAD, hyperlipidemia, and essential hypertension, rheumatoid arthritis and hypothyroidism was brought to the ER after patient was found to be  confused with persistent fever chills.  ED Course: Patient was found to be febrile and tachycardic and UA shows features consistent with UTI. On exam patient has left wrist redness. Patient is being admitted for sepsis most likely from UTI and possible cellulitis. Chest x-ray was unremarkable. CT of the head was showing nothing acute.  Assessment & Plan:  # Presumed Sepsis due to UTI and possible Left wrist cellulitis:  -She is clinically improving. Continue empiric antibiotics vancomycin and Zosyn. Follow up culture results. - Left wrist x-ray showed possible erosive arthritis with no evidence of fracture or dislocation. -Continue supportive care.  #Acute cystitis without hematuria, site unspecified: On antibiotics, follow up culture.  #Acute kidney injury on chronic knee disease stage III: Likely in the setting of UTI. Serum creatinine level improving. Continue to monitor.  #fall/Confusion, probable acute metabolic encephalopathy in the setting of UTI: Patient is alert awake and oriented this morning. Her mental status is improved. -Ordered PT, OT and Copywriter, advertising.  # History of rheumatoid arthritis on chronic prednisone: Continue home dose of prednisone. Received a dose of hydrocortisone at stress dose on admission.  #History of DVT: Resume home dose of Eliquis. No procedure planned therefore discontinue IV heparin.  # History of hypothyroidism: Check TSH. Resume home dose of Synthroid.  #History of obstructive hypertrophic cardiomyopathy: Continue metoprolol.  Principal Problem:   Sepsis ([place]) Active Problems:   RA  (rheumatoid arthritis) (HCC)   Obstructive hypertrophic cardiomyopathy (HCC)   Chronic diastolic CHF (congestive heart failure) (HCC)   Pulmonary hypertension   CKD (chronic kidney disease) stage 3, GFR 30-59 ml/min   UTI (urinary tract infection)   Wrist swelling, left   Acute encephalopathy   Pain and swelling of left wrist  DVT prophylaxis: Systemic anticoagulation Code Status: Full code Family Communication: No family present at bedside Disposition Plan: Likely transfer her care to a skilled nursing facility in 1-2 days    Consultants:   None  Procedures: None Antimicrobials: On IV vancomycin and Zosyn since 3/19  Subjective: Patient was seen and examined at bedside. Patient reported feeling much better. She was alert awake and oriented. Denied pain, nausea vomiting or dizziness.  Objective: Vitals:   09/16/16 0500 09/16/16 0547 09/16/16 1025 09/16/16 1354  BP:  (!) 145/68 (!) 141/55 (!) 115/58  Pulse:  73 81 88  Resp:  18  18  Temp:  97.8 F (36.6 C)  98.3 F (36.8 C)  TempSrc:  Oral  Oral  SpO2:  99%  94%  Weight: 65.9 kg (145 lb 5 oz)       Intake/Output Summary (Last 24 hours) at 09/16/16 1453 Last data filed at 09/15/16 1938  Gross per 24 hour  Intake             1050 ml  Output               15 ml  Net             1035 ml   Filed Weights   09/15/16 1341 09/16/16 0500  Weight: 68 kg (150 lb) 65.9 kg (145 lb 5 oz)    Examination:  General exam: Elderly female lying on bed  comfortable, not in distress Respiratory system: Clear to auscultation. Respiratory effort normal. No wheezing or crackle Cardiovascular system: S1 & S2 heard, RRR.  No pedal edema. Gastrointestinal system: Abdomen is nondistended, soft and nontender. Normal bowel sounds heard. Central nervous system: Alert and oriented. No focal neurological deficits. Extremities: Symmetric 5 x 5 power. Left wrist with mild redness and swelling able to move fingers. She has an IV line and left  upper extremity Skin: Redness on left wrist joint Psychiatry: Judgement and insight appear normal. Mood & affect appropriate.     Data Reviewed: I have personally reviewed following labs and imaging studies  CBC:  Recent Labs Lab 09/15/16 1346 09/16/16 0615  WBC 15.2* 10.1  NEUTROABS 12.7* 8.8*  HGB 10.4* 9.4*  HCT 33.4* 30.0*  MCV 89.1 88.5  PLT 133* 505*   Basic Metabolic Panel:  Recent Labs Lab 09/15/16 1346 09/16/16 0615  NA 139 141  K 3.8 4.0  CL 105 105  CO2 24 25  GLUCOSE 141* 143*  BUN 27* 23*  CREATININE 1.88* 1.68*  CALCIUM 8.8* 8.3*   GFR: Estimated Creatinine Clearance (by C-G formula based on SCr of 1.68 mg/dL (H)) Female: 23.3 mL/min (A) Female: 29.5 mL/min (A) Liver Function Tests:  Recent Labs Lab 09/15/16 1346 09/16/16 0615  AST 32 25  ALT 17 14  ALKPHOS 43 44  BILITOT 0.6 0.6  PROT 5.2* 4.7*  ALBUMIN 2.7* 2.2*   No results for input(s): LIPASE, AMYLASE in the last 168 hours. No results for input(s): AMMONIA in the last 168 hours. Coagulation Profile:  Recent Labs Lab 09/15/16 1346  INR 1.99   Cardiac Enzymes:  Recent Labs Lab 09/16/16 0615  TROPONINI 0.09*   BNP (last 3 results) No results for input(s): PROBNP in the last 8760 hours. HbA1C: No results for input(s): HGBA1C in the last 72 hours. CBG:  Recent Labs Lab 09/16/16 0816 09/16/16 1246  GLUCAP 122* 105*   Lipid Profile: No results for input(s): CHOL, HDL, LDLCALC, TRIG, CHOLHDL, LDLDIRECT in the last 72 hours. Thyroid Function Tests: No results for input(s): TSH, T4TOTAL, FREET4, T3FREE, THYROIDAB in the last 72 hours. Anemia Panel: No results for input(s): VITAMINB12, FOLATE, FERRITIN, TIBC, IRON, RETICCTPCT in the last 72 hours. Sepsis Labs:  Recent Labs Lab 09/15/16 1407 09/15/16 1716 09/15/16 2322  PROCALCITON  --   --  1.48  LATICACIDVEN 1.71 1.23 1.2    Recent Results (from the past 240 hour(s))  MRSA PCR Screening     Status: None    Collection Time: 09/15/16  9:08 PM  Result Value Ref Range Status   MRSA by PCR NEGATIVE NEGATIVE Final    Comment:        The GeneXpert MRSA Assay (FDA approved for NASAL specimens only), is one component of a comprehensive MRSA colonization surveillance program. It is not intended to diagnose MRSA infection nor to guide or monitor treatment for MRSA infections.          Radiology Studies: Dg Chest 2 View  Result Date: 09/15/2016 CLINICAL DATA:  Fever. EXAM: CHEST  2 VIEW COMPARISON:  Radiograph of July 23, 2015. FINDINGS: Stable cardiomediastinal silhouette. No pneumothorax or pleural effusion is noted. Status post surgical posterior fusion of thoracic spine. No acute pulmonary disease is noted. Stable hiatal hernia. IMPRESSION: No active cardiopulmonary disease.  Stable hiatal hernia. Electronically Signed   By: Marijo Conception, M.D.   On: 09/15/2016 14:59   Dg Wrist 2 Views Left  Result Date: 09/15/2016  CLINICAL DATA:  Status post fall, with left wrist erythema and swelling. Initial encounter. EXAM: LEFT WRIST - 2 VIEW COMPARISON:  None. FINDINGS: There is no evidence of fracture or dislocation. The carpal rows demonstrate normal alignment. A few osseous erosions are noted at the carpal rows. The joint spaces are preserved. Soft tissue swelling is noted about the wrist. IMPRESSION: 1. No evidence of fracture or dislocation. 2. Few osseous erosions at the carpal rows, likely reflecting erosive arthritis. Electronically Signed   By: Garald Balding M.D.   On: 09/15/2016 22:52   Ct Cervical Spine Wo Contrast  Result Date: 09/15/2016 CLINICAL DATA:  Status post fall, with confusion. Concern for cervical spine injury. Initial encounter. EXAM: CT CERVICAL SPINE WITHOUT CONTRAST TECHNIQUE: Multidetector CT imaging of the cervical spine was performed without intravenous contrast. Multiplanar CT image reconstructions were also generated. COMPARISON:  None. FINDINGS: Alignment: There is  grade 1 anterolisthesis of C3 on C4, and grade 2 anterolisthesis of C4 on C5. There is grade 1 retrolisthesis of C5 on C6. There is grade 1 anterolisthesis of C7 on T1 and of T1 on T2. Underlying facet disease is noted at the mid cervical spine. Skull base and vertebrae: No acute fracture. No primary bone lesion or focal pathologic process. Soft tissues and spinal canal: No prevertebral fluid or swelling. No visible canal hematoma. Disc levels: Multilevel disc space narrowing is noted along the lower cervical spine. Upper chest: Dense calcification is seen at the carotid bifurcations bilaterally. The thyroid gland is unremarkable in appearance. The visualized lung apices are grossly clear. Other: The visualized portions of the brain are grossly unremarkable. IMPRESSION: 1. No evidence of acute fracture or subluxation along the cervical spine. 2. Multilevel degenerative change along the cervical spine, as described above. 3. Dense calcification at the carotid bifurcations bilaterally. Carotid ultrasound would be helpful for further evaluation, when and as deemed clinically appropriate. Electronically Signed   By: Garald Balding M.D.   On: 09/15/2016 23:35   Dg Pelvis Portable  Result Date: 09/15/2016 CLINICAL DATA:  Status post fall, with pelvic pain. Initial encounter. EXAM: PORTABLE PELVIS 1-2 VIEWS COMPARISON:  CT of the abdomen pelvis performed 12/18/2012 FINDINGS: There is no evidence of fracture or dislocation. Lumbar spinal fusion hardware is noted. Both femoral heads are seated normally within their respective acetabula. No significant degenerative change is appreciated. The sacroiliac joints are unremarkable in appearance. The visualized bowel gas pattern is grossly unremarkable in appearance. Scattered phleboliths are noted within the pelvis. Scattered vascular calcifications are seen. IMPRESSION: 1. No evidence of fracture or dislocation. 2. Scattered vascular calcifications seen. Electronically Signed    By: Garald Balding M.D.   On: 09/15/2016 22:51        Scheduled Meds: . apixaban  5 mg Oral BID  . aspirin EC  81 mg Oral QHS  . LORazepam  0.5 mg Oral QHS  . metoprolol  50 mg Oral BID  . omega-3 acid ethyl esters  1,000 mg Oral Daily  . piperacillin-tazobactam (ZOSYN)  IV  3.375 g Intravenous Q8H  . polyvinyl alcohol  1 drop Both Eyes TID  . predniSONE  10 mg Oral Q breakfast  . vancomycin  750 mg Intravenous Q24H   Continuous Infusions:   LOS: 1 day    Camp Gopal Tanna Furry, MD [facility] Pager [phone]  If 7PM-7AM, please contact night-coverage [email or web address] Password [facility] 09/16/2016, 2:53 PM

## 2016-09-16 NOTE — Progress Notes (Signed)
CRITICAL VALUE ALERT  Critical value received:  Troponin 0.09  Date of notification:  09/16/16  Time of notification:  0809  Critical value read back:Yes.    Nurse who received alert:  Rolland Porter  MD notified (1st page):  MD Carolin Sicks  Time of first page:  0810  MD notified (2nd page):  Time of second page:  Responding MD:  Carolin Sicks  Time MD responded:  [phone]

## 2016-09-16 NOTE — Progress Notes (Signed)
PT Cancellation Note  Patient Details Name: [name] MRN: [id number] DOB: Nov 24, 1929   Cancelled Treatment:    Reason Eval/Treat Not Completed: Patient declined, no reason specified. "My arm's not better and I can't do anything." Will see pt 3/21 as able. 09/16/2016  Donnella Sham, PT [phone]  (pager)   Tessie Fass [place] 09/16/2016, 5:25 PM

## 2016-09-16 NOTE — Progress Notes (Signed)
CRITICAL VALUE ALERT  Critical value received:  PTT >200  Date of notification:  09/16/16  Time of notification:  0805  Critical value read back:Yes.    Nurse who received alert:  Rolland Porter  MD notified (1st page):  MD Carolin Sicks  Time of first page:  0806  MD notified (2nd page):  Time of second page:  Responding MD:  MD Carolin Sicks  Time MD responded:  0712  MD ordered to hold heparin drip and contact pharmacy.

## 2016-09-17 LAB — BASIC METABOLIC PANEL
Anion gap: 9 (ref 5–15)
BUN: 21 mg/dL — AB (ref 6–20)
CALCIUM: 8 mg/dL — AB (ref 8.9–10.3)
CHLORIDE: 109 mmol/L (ref 101–111)
CO2: 24 mmol/L (ref 22–32)
CREATININE: 1.58 mg/dL — AB (ref 0.44–1.00)
GFR calc non Af Amer: 29 mL/min — ABNORMAL LOW (ref >60)
GFR, EST AFRICAN AMERICAN: 34 mL/min — AB (ref >60)
Glucose, Bld: 95 mg/dL (ref 65–99)
Potassium: 3.1 mmol/L — ABNORMAL LOW (ref 3.5–5.1)
SODIUM: 142 mmol/L (ref 135–145)

## 2016-09-17 LAB — CBC
HCT: 29.4 % — ABNORMAL LOW (ref 36.0–46.0)
Hemoglobin: 9.3 g/dL — ABNORMAL LOW (ref 12.0–15.0)
MCH: 27.8 pg (ref 26.0–34.0)
MCHC: 31.6 g/dL (ref 30.0–36.0)
MCV: 87.8 fL (ref 78.0–100.0)
Platelets: 122 10*3/uL — ABNORMAL LOW (ref 150–400)
RBC: 3.35 MIL/uL — ABNORMAL LOW (ref 3.87–5.11)
RDW: 16.2 % — AB (ref 11.5–15.5)
WBC: 9 10*3/uL (ref 4.0–10.5)

## 2016-09-17 LAB — URINE CULTURE

## 2016-09-17 MED ORDER — PROMETHAZINE HCL 25 MG/ML IJ SOLN
12.5000 mg | Freq: Once | INTRAMUSCULAR | Status: AC
  Administered 2016-09-17: 12.5 mg via INTRAVENOUS
  Filled 2016-09-17: qty 1

## 2016-09-17 MED ORDER — ENSURE ENLIVE PO LIQD
237.0000 mL | Freq: Three times a day (TID) | ORAL | Status: DC
  Administered 2016-09-17: 237 mL via ORAL

## 2016-09-17 MED ORDER — LEVOTHYROXINE SODIUM 25 MCG PO TABS
25.0000 ug | ORAL_TABLET | Freq: Every day | ORAL | Status: DC
  Administered 2016-09-18: 25 ug via ORAL
  Filled 2016-09-17: qty 1

## 2016-09-17 MED ORDER — FLUORESCEIN SODIUM 0.6 MG OP STRP
ORAL_STRIP | OPHTHALMIC | Status: AC
  Filled 2016-09-17: qty 1

## 2016-09-17 MED ORDER — HYDROCODONE-ACETAMINOPHEN 5-325 MG PO TABS
2.0000 | ORAL_TABLET | Freq: Once | ORAL | Status: DC
  Filled 2016-09-17: qty 2

## 2016-09-17 MED ORDER — ERYTHROMYCIN 5 MG/GM OP OINT
TOPICAL_OINTMENT | Freq: Four times a day (QID) | OPHTHALMIC | Status: DC
  Administered 2016-09-17 – 2016-09-18 (×2): 1 via OPHTHALMIC
  Administered 2016-09-18: 12:00:00 via OPHTHALMIC
  Filled 2016-09-17: qty 3.5

## 2016-09-17 MED ORDER — TETRACAINE HCL 0.5 % OP SOLN
2.0000 [drp] | Freq: Once | OPHTHALMIC | Status: DC
  Filled 2016-09-17: qty 2

## 2016-09-17 NOTE — Progress Notes (Signed)
Late Entry-  Family concerned about pt. Left eye being reddened and close shut.  Text paged Dr. Carolin Sicks to informed. Received return call back and asked if she had some eye drops and this RN informed him that she did, as well that RN before me had placed a cool compress to eye which had helped earlier.  Stated to continue which eye drops and cool compresses and will assess in the am.  Will continue to monitor.  Alphonzo Lemmings, RN

## 2016-09-17 NOTE — Evaluation (Signed)
Physical Therapy Evaluation Patient Details Name: [name] MRN: [id number] DOB: [birth year] Today's Date: 09/17/2016   History of Present Illness  Pt is an 83 y.o.femalewith hypertrophic cardiomyopathy, chronic diastolic heart failure, CAD, hyperlipidemia, and essential hypertension, rheumatoid arthritis and hypothyroidism was brought to the ER after patient was found to be getting confused with persistent fever chills. Pt also noted to have increased edema of L wrist.   Clinical Impression  Pt presented supine in bed with HOB elevated, awake and willing to participate in therapy session. Prior to admission, pt reported that she ambulated short distances with RW but uses a w/c primarily for mobility. Pt currently requires mod-max A x2 for bed mobility and transfers. Pt would continue to benefit from skilled physical therapy services at this time while admitted and after d/c to address her below listed limitations in order to improve her overall safety and independence with functional mobility.      Follow Up Recommendations SNF    Equipment Recommendations  None recommended by PT    Recommendations for Other Services       Precautions / Restrictions Precautions Precautions: Fall Restrictions Weight Bearing Restrictions: No      Mobility  Bed Mobility Overal bed mobility: Needs Assistance Bed Mobility: Supine to Sit     Supine to sit: Max assist;+2 for safety/equipment     General bed mobility comments: Pt able to assist with bringing LEs to EOB but max assist with max cues required for trunk elevation and scooting hips out to EOB.  Transfers Overall transfer level: Needs assistance Equipment used: 2 person hand held assist Transfers: Sit to/from [facility] Sit to Stand: Mod assist;+2 physical assistance Stand pivot transfers: Max assist;+2 physical assistance       General transfer comment: Cues for hand placement and stepping feet for pivot  transfers. Sit to stand from EOB x1, BSC x1. Stand pivot x2.  Ambulation/Gait                Stairs            Wheelchair Mobility    Modified Rankin (Stroke Patients Only)       Balance Overall balance assessment: Needs assistance Sitting-balance support: Feet supported;Bilateral upper extremity supported Sitting balance-Leahy Scale: Fair     Standing balance support: Bilateral upper extremity supported Standing balance-Leahy Scale: Poor Standing balance comment: pt reliant on bilateral UE supports                             Pertinent Vitals/Pain Pain Assessment: Faces Faces Pain Scale: Hurts little more Pain Location: L eye Pain Descriptors / Indicators: Burning;Other (Comment) (itching) Pain Intervention(s): Monitored during session;Repositioned    Home Living Family/patient expects to be discharged to:: Skilled nursing facility                 Additional Comments: Resident of [place] the last 5 years    Prior Function Level of Independence: Needs assistance   Gait / Transfers Assistance Needed: Pt reports she ambulates with RW but recently has been performing mobility via w/c  ADL's / Homemaking Assistance Needed: Assist from SNF staff with BADL, takes meals in room        Hand Dominance        Extremity/Trunk Assessment   Upper Extremity Assessment Upper Extremity Assessment: Defer to OT evaluation LUE Deficits / Details: pt without c/o pain in L wrist this session  Lower Extremity Assessment Lower Extremity Assessment: Generalized weakness    Cervical / Trunk Assessment Cervical / Trunk Assessment: Kyphotic  Communication   Communication: No difficulties  Cognition Arousal/Alertness: Awake/alert Behavior During Therapy: Flat affect Overall Cognitive Status: Impaired/Different from baseline Area of Impairment: Problem solving             Problem Solving: Slow processing;Difficulty sequencing;Requires  verbal cues General Comments: Requires max cues during functional tasks for sequencing.    General Comments      Exercises     Assessment/Plan    PT Assessment Patient needs continued PT services  PT Problem List Decreased strength;Decreased activity tolerance;Decreased balance;Decreased mobility;Decreased coordination;Decreased cognition;Decreased knowledge of use of DME;Decreased safety awareness;Pain       PT Treatment Interventions DME instruction;Gait training;Functional mobility training;Therapeutic activities;Therapeutic exercise;Balance training;Neuromuscular re-education;Cognitive remediation;Patient/family education    PT Goals (Current goals can be found in the Care Plan section)  Acute Rehab PT Goals Patient Stated Goal: return to [facility] PT Goal Formulation: With patient Time For Goal Achievement: 10/01/16 Potential to Achieve Goals: Fair    Frequency Min 2X/week   Barriers to discharge        Co-evaluation PT/OT/SLP Co-Evaluation/Treatment: Yes Reason for Co-Treatment: For patient/therapist safety PT goals addressed during session: Mobility/safety with mobility;Balance;Proper use of DME OT goals addressed during session: ADL's and self-care       End of Session Equipment Utilized During Treatment: Gait belt Activity Tolerance: Patient limited by fatigue Patient left: in chair;with call bell/phone within reach;with chair alarm set Nurse Communication: Mobility status PT Visit Diagnosis: Other abnormalities of gait and mobility (R26.89)         Time: 8875-7972 PT Time Calculation (min) (ACUTE ONLY): 28 min   Charges:   PT Evaluation $PT Eval Moderate Complexity: 1 Procedure     PT G CodesClearnce Sorrel Maycel Riffe 09/17/2016, 4:19 PM Sherie Don, [place], DPT [phone]

## 2016-09-17 NOTE — Progress Notes (Signed)
PROGRESS NOTE    [name]  KKX:[id number] DOB: 12/06/1929 DOA: 09/15/2016 PCP: [name], MD   Brief Narrative: 83 y.o. female with hypertrophic cardiomyopathy, chronic diastolic heart failure, CAD, hyperlipidemia, and essential hypertension, rheumatoid arthritis and hypothyroidism was brought to the ER after patient was found to be  confused with persistent fever chills.  ED Course: Patient was found to be febrile and tachycardic and UA shows features consistent with UTI. On exam patient has left wrist redness. Patient is being admitted for sepsis most likely from UTI and possible cellulitis. Chest x-ray was unremarkable. CT of the head was showing nothing acute.  Assessment & Plan:  # Presumed Sepsis due to UTI and possible Left wrist cellulitis:  -Patient is continued to improve clinically. Left wrist cellulitis looks better. Continue empiric antibiotics vancomycin and Zosyn. I will follow up culture results. If negative by tomorrow may be able to switch to oral antibiotics and transition her care to SNF. I discussed this with the patient, her daughter and Education officer, museum. - Left wrist x-ray showed possible erosive arthritis with no evidence of fracture or dislocation. -Continue supportive care.  #Acute cystitis without hematuria, site unspecified: On antibiotics, follow up culture.  #Acute kidney injury on chronic knee disease stage III: Likely in the setting of UTI. Serum creatinine level improving. Continue to monitor. Follow-up lab results from today.  #fall/Confusion, probable acute metabolic encephalopathy in the setting of UTI: Patient is alert awake and oriented this morning. Her mental status is improved. -Ordered PT, OT and Copywriter, advertising.  # History of rheumatoid arthritis on chronic prednisone: Continue home dose of prednisone. Received a dose of hydrocortisone at stress dose on admission.  #History of DVT: Resume home dose of Eliquis. Clinically  stable.  # History of hypothyroidism:  TSH level low therefore I would reduce the dose of Synthroid. Recommended to monitor thyroid function test in 4-6 weeks.  #History of obstructive hypertrophic cardiomyopathy: Continue metoprolol.  Principal Problem:   Sepsis ([place]) Active Problems:   RA (rheumatoid arthritis) (HCC)   Obstructive hypertrophic cardiomyopathy (HCC)   Chronic diastolic CHF (congestive heart failure) (HCC)   Pulmonary hypertension   CKD (chronic kidney disease) stage 3, GFR 30-59 ml/min   UTI (urinary tract infection)   Wrist swelling, left   Acute encephalopathy   Pain and swelling of left wrist  DVT prophylaxis: Systemic anticoagulation Code Status: Full code Family Communication: I discussed with the patient's daughter over the phone and updated the care plan. Disposition Plan: Likely transfer her care to a skilled nursing facility in 1-2 days    Consultants:   None  Procedures: None Antimicrobials: On IV vancomycin and Zosyn since 3/19  Subjective: Patient was seen and examined at bedside. Patient reported doing well. Denies fever, chills, headache, dizziness. No nausea or vomiting. Left wrist has no pain.   Objective: Vitals:   09/16/16 1354 09/16/16 2126 09/17/16 0534 09/17/16 0908  BP: (!) 115/58 (!) 144/82 (!) 153/74 (!) 163/80  Pulse: 88 84  83  Resp: 18 16 18    Temp: 98.3 F (36.8 C) 99 F (37.2 C) 98.1 F (36.7 C)   TempSrc: Oral     SpO2: 94% 98% 98%   Weight:   62.6 kg (137 lb 14.4 oz)     Intake/Output Summary (Last 24 hours) at 09/17/16 1046 Last data filed at 09/17/16 0400  Gross per 24 hour  Intake              540 ml  Output              800 ml  Net             -260 ml   Filed Weights   09/15/16 1341 09/16/16 0500 09/17/16 0534  Weight: 68 kg (150 lb) 65.9 kg (145 lb 5 oz) 62.6 kg (137 lb 14.4 oz)    Examination:  General exam: Pleasant elderly female lying on bed comfortable, not in distress. Respiratory system:  Clear bilateral. Respiratory effort normal. No wheezing or crackle Cardiovascular system: S1 & S2 heard, RRR.  No pedal edema. Gastrointestinal system: Abdomen soft, nontender. Bowel sound positive Central nervous system: Alert and oriented. No focal neurological deficits. Extremities: Symmetric 5 x 5 power. Left wrist redness and swelling improved.  Skin: Redness on left wrist joint improved Psychiatry: Judgement and insight appear normal. Mood & affect appropriate.     Data Reviewed: I have personally reviewed following labs and imaging studies  CBC:  Recent Labs Lab 09/15/16 1346 09/16/16 0615  WBC 15.2* 10.1  NEUTROABS 12.7* 8.8*  HGB 10.4* 9.4*  HCT 33.4* 30.0*  MCV 89.1 88.5  PLT 133* 740*   Basic Metabolic Panel:  Recent Labs Lab 09/15/16 1346 09/16/16 0615  NA 139 141  K 3.8 4.0  CL 105 105  CO2 24 25  GLUCOSE 141* 143*  BUN 27* 23*  CREATININE 1.88* 1.68*  CALCIUM 8.8* 8.3*   GFR: Estimated Creatinine Clearance (by C-G formula based on SCr of 1.68 mg/dL (H)) Female: 23.3 mL/min (A) Female: 29 mL/min (A) Liver Function Tests:  Recent Labs Lab 09/15/16 1346 09/16/16 0615  AST 32 25  ALT 17 14  ALKPHOS 43 44  BILITOT 0.6 0.6  PROT 5.2* 4.7*  ALBUMIN 2.7* 2.2*   No results for input(s): LIPASE, AMYLASE in the last 168 hours. No results for input(s): AMMONIA in the last 168 hours. Coagulation Profile:  Recent Labs Lab 09/15/16 1346  INR 1.99   Cardiac Enzymes:  Recent Labs Lab 09/16/16 0615  TROPONINI 0.09*   BNP (last 3 results) No results for input(s): PROBNP in the last 8760 hours. HbA1C: No results for input(s): HGBA1C in the last 72 hours. CBG:  Recent Labs Lab 09/16/16 0816 09/16/16 1246  GLUCAP 122* 105*   Lipid Profile: No results for input(s): CHOL, HDL, LDLCALC, TRIG, CHOLHDL, LDLDIRECT in the last 72 hours. Thyroid Function Tests:  Recent Labs  09/16/16 1636  TSH 0.251*   Anemia Panel: No results for input(s):  VITAMINB12, FOLATE, FERRITIN, TIBC, IRON, RETICCTPCT in the last 72 hours. Sepsis Labs:  Recent Labs Lab 09/15/16 1407 09/15/16 1716 09/15/16 2322  PROCALCITON  --   --  1.48  LATICACIDVEN 1.71 1.23 1.2    Recent Results (from the past 240 hour(s))  Culture, blood (Routine x 2)     Status: None (Preliminary result)   Collection Time: 09/15/16  2:26 PM  Result Value Ref Range Status   Specimen Description BLOOD RIGHT HAND  Final   Special Requests BOTTLES DRAWN AEROBIC ONLY 5CC  Final   Culture NO GROWTH 2 DAYS  Final   Report Status PENDING  Incomplete  Culture, blood (Routine x 2)     Status: None (Preliminary result)   Collection Time: 09/15/16  2:33 PM  Result Value Ref Range Status   Specimen Description BLOOD RIGHT FOREARM  Final   Special Requests BOTTLES DRAWN AEROBIC ONLY 5CC  Final   Culture NO GROWTH 2 DAYS  Final  Report Status PENDING  Incomplete  Urine culture     Status: None (Preliminary result)   Collection Time: 09/15/16  5:32 PM  Result Value Ref Range Status   Specimen Description URINE, CATHETERIZED  Final   Special Requests ADD ON 2140 [id number]  Final   Culture CULTURE REINCUBATED FOR BETTER GROWTH  Final   Report Status PENDING  Incomplete  MRSA PCR Screening     Status: None   Collection Time: 09/15/16  9:08 PM  Result Value Ref Range Status   MRSA by PCR NEGATIVE NEGATIVE Final    Comment:        The GeneXpert MRSA Assay (FDA approved for NASAL specimens only), is one component of a comprehensive MRSA colonization surveillance program. It is not intended to diagnose MRSA infection nor to guide or monitor treatment for MRSA infections.          Radiology Studies: Dg Chest 2 View  Result Date: 09/15/2016 CLINICAL DATA:  Fever. EXAM: CHEST  2 VIEW COMPARISON:  Radiograph of July 23, 2015. FINDINGS: Stable cardiomediastinal silhouette. No pneumothorax or pleural effusion is noted. Status post surgical posterior fusion of thoracic  spine. No acute pulmonary disease is noted. Stable hiatal hernia. IMPRESSION: No active cardiopulmonary disease.  Stable hiatal hernia. Electronically Signed   By: Marijo Conception, M.D.   On: 09/15/2016 14:59   Dg Wrist 2 Views Left  Result Date: 09/15/2016 CLINICAL DATA:  Status post fall, with left wrist erythema and swelling. Initial encounter. EXAM: LEFT WRIST - 2 VIEW COMPARISON:  None. FINDINGS: There is no evidence of fracture or dislocation. The carpal rows demonstrate normal alignment. A few osseous erosions are noted at the carpal rows. The joint spaces are preserved. Soft tissue swelling is noted about the wrist. IMPRESSION: 1. No evidence of fracture or dislocation. 2. Few osseous erosions at the carpal rows, likely reflecting erosive arthritis. Electronically Signed   By: Garald Balding M.D.   On: 09/15/2016 22:52   Ct Cervical Spine Wo Contrast  Result Date: 09/15/2016 CLINICAL DATA:  Status post fall, with confusion. Concern for cervical spine injury. Initial encounter. EXAM: CT CERVICAL SPINE WITHOUT CONTRAST TECHNIQUE: Multidetector CT imaging of the cervical spine was performed without intravenous contrast. Multiplanar CT image reconstructions were also generated. COMPARISON:  None. FINDINGS: Alignment: There is grade 1 anterolisthesis of C3 on C4, and grade 2 anterolisthesis of C4 on C5. There is grade 1 retrolisthesis of C5 on C6. There is grade 1 anterolisthesis of C7 on T1 and of T1 on T2. Underlying facet disease is noted at the mid cervical spine. Skull base and vertebrae: No acute fracture. No primary bone lesion or focal pathologic process. Soft tissues and spinal canal: No prevertebral fluid or swelling. No visible canal hematoma. Disc levels: Multilevel disc space narrowing is noted along the lower cervical spine. Upper chest: Dense calcification is seen at the carotid bifurcations bilaterally. The thyroid gland is unremarkable in appearance. The visualized lung apices are grossly  clear. Other: The visualized portions of the brain are grossly unremarkable. IMPRESSION: 1. No evidence of acute fracture or subluxation along the cervical spine. 2. Multilevel degenerative change along the cervical spine, as described above. 3. Dense calcification at the carotid bifurcations bilaterally. Carotid ultrasound would be helpful for further evaluation, when and as deemed clinically appropriate. Electronically Signed   By: Garald Balding M.D.   On: 09/15/2016 23:35   Dg Pelvis Portable  Result Date: 09/15/2016 CLINICAL DATA:  Status post fall, with  pelvic pain. Initial encounter. EXAM: PORTABLE PELVIS 1-2 VIEWS COMPARISON:  CT of the abdomen pelvis performed 12/18/2012 FINDINGS: There is no evidence of fracture or dislocation. Lumbar spinal fusion hardware is noted. Both femoral heads are seated normally within their respective acetabula. No significant degenerative change is appreciated. The sacroiliac joints are unremarkable in appearance. The visualized bowel gas pattern is grossly unremarkable in appearance. Scattered phleboliths are noted within the pelvis. Scattered vascular calcifications are seen. IMPRESSION: 1. No evidence of fracture or dislocation. 2. Scattered vascular calcifications seen. Electronically Signed   By: Garald Balding M.D.   On: 09/15/2016 22:51        Scheduled Meds: . apixaban  5 mg Oral BID  . aspirin EC  81 mg Oral QHS  . levothyroxine  50 mcg Oral QAC breakfast  . LORazepam  0.5 mg Oral QHS  . metoprolol  50 mg Oral BID  . omega-3 acid ethyl esters  1,000 mg Oral Daily  . pantoprazole  40 mg Oral BID  . piperacillin-tazobactam (ZOSYN)  IV  3.375 g Intravenous Q8H  . polyvinyl alcohol  1 drop Both Eyes TID  . predniSONE  10 mg Oral Q breakfast  . rosuvastatin  20 mg Oral q1800  . vancomycin  750 mg Intravenous Q24H   Continuous Infusions:   LOS: 2 days    Amai Cappiello Tanna Furry, MD [facility] Pager [phone]  If 7PM-7AM, please  contact night-coverage [email or web address] Password [facility] 09/17/2016, 10:46 AM

## 2016-09-17 NOTE — Progress Notes (Signed)
Received another call back from Dr. Carolin Sicks, who asked if pt. Was having difficult seeing.  Asked pt. To close right eye and tell me how many fingers this RN was holding up.  Pt. Was unable to give correct number at two different distances.  MD stated to contact night coverage to see if they could come up to assess the patient.  Will continue to monitor.  Alphonzo Lemmings, RN

## 2016-09-17 NOTE — NC FL2 (Signed)
[place] MEDICAID FL2 LEVEL OF CARE SCREENING TOOL     IDENTIFICATION  Patient Name: [name] Birthdate: December 12, 1929 Sex: female Admission Date (Current Location): 09/15/2016  [facility] and [place] Number:  Herbalist and Address:  [facility] [place] [place] [address], [place], [place] [address]      Provider Number: [id number]  Attending Physician Name and Address:  Rosita Fire, MD  Relative Name and Phone Number:       Current Level of Care: Hospital Recommended Level of Care: [place] Prior Approval Number:    Date Approved/Denied:   PASRR Number: [id number]  Discharge Plan: SNF    Current Diagnoses: Patient Active Problem List   Diagnosis Date Noted  . Sepsis ([place]) 09/15/2016  . Wrist swelling, left 09/15/2016  . Acute encephalopathy 09/15/2016  . Pain and swelling of left wrist   . UTI (urinary tract infection) 04/08/2016  . Benign paroxysmal positional vertigo 12/19/2015  . Easy bruising 09/20/2015  . CKD (chronic kidney disease) stage 3, GFR 30-59 ml/min 07/27/2015  . Anemia of chronic disease 07/27/2015  . Pulmonary hypertension   . Generalized osteoarthritis 04/11/2015  . Thyroid activity decreased 04/11/2015  . Coronary artery disease   . Obstructive hypertrophic cardiomyopathy ([place])   . Chronic diastolic CHF (congestive heart failure) ([place])   . Vertigo 05/17/2014  . Hemorrhoids 04/24/2014  . Hypothyroidism 12/16/2013  . HLD (hyperlipidemia) 12/16/2013  . Benign hypertensive heart and kidney disease with diastolic CHF, NYHA class II and CKD stage III ([place]) 12/16/2013  . Allergic rhinitis 07/05/2013  . Acute kidney injury superimposed on CKD ([place]) 01/22/2013  . Adrenal insufficiency ([place]) 12/08/2011    Class: Chronic  . History of CVA (cerebrovascular accident) 12/07/2011    Class: Acute  . Dysphagia 12/07/2011    Class: Acute  . MI (myocardial infarction)   . RA (rheumatoid arthritis) ([place])    . PUD (peptic ulcer disease)   . Hiatal hernia with gastroesophageal reflux   . Hiatal hernia   . Osteoporosis   . Depression   . Anxiety     Orientation RESPIRATION BLADDER Height & Weight     Self  Normal Incontinent Weight: 62.6 kg (137 lb 14.4 oz) Height:     BEHAVIORAL SYMPTOMS/MOOD NEUROLOGICAL BOWEL NUTRITION STATUS      Incontinent Diet (Please see DC Summary)  AMBULATORY STATUS COMMUNICATION OF NEEDS Skin   Extensive Assist Verbally Normal                       Personal Care Assistance Level of Assistance  Bathing, Feeding, Dressing Bathing Assistance: Maximum assistance Feeding assistance: Limited assistance Dressing Assistance: Limited assistance     Functional Limitations Info             SPECIAL CARE FACTORS FREQUENCY                       Contractures      Additional Factors Info  Code Status, Allergies, Isolation Precautions, Psychotropic Code Status Info: Full Allergies Info: Codeine, Sulfa Antibiotics, Biphosphate, Morphine And Related, Percocet Oxycodone-acetaminophen, Plavix Clopidogrel Bisulfate Psychotropic Info: Ativan   Isolation Precautions Info: Droplet precautions     Current Medications (09/17/2016):  This is the current hospital active medication list Current Facility-Administered Medications  Medication Dose Route Frequency Provider Last Rate Last Dose  . acetaminophen (TYLENOL) tablet 650 mg  650 mg Oral Q6H PRN Rise Patience, MD  650 mg at 09/16/16 2241   Or  . acetaminophen (TYLENOL) suppository 650 mg  650 mg Rectal Q6H PRN Rise Patience, MD      . apixaban (ELIQUIS) tablet 5 mg  5 mg Oral BID Theone Murdoch Hammons, RPH   5 mg at 09/17/16 0908  . aspirin EC tablet 81 mg  81 mg Oral QHS Rise Patience, MD   81 mg at 09/16/16 2137  . HYDROcodone-acetaminophen (NORCO/VICODIN) 5-325 MG per tablet 1 tablet  1 tablet Oral BID PRN Rise Patience, MD      . ipratropium-albuterol (DUONEB) 0.5-2.5 (3)  MG/3ML nebulizer solution 3 mL  3 mL Nebulization Q4H PRN Dron Tanna Furry, MD      . levothyroxine (SYNTHROID, LEVOTHROID) tablet 50 mcg  50 mcg Oral QAC breakfast Dron Tanna Furry, MD   50 mcg at 09/17/16 0908  . LORazepam (ATIVAN) tablet 0.5 mg  0.5 mg Oral QHS Rise Patience, MD   0.5 mg at 09/16/16 2137  . methocarbamol (ROBAXIN) tablet 500 mg  500 mg Oral Q8H PRN Dron Tanna Furry, MD   500 mg at 09/17/16 0043  . metoprolol (LOPRESSOR) tablet 50 mg  50 mg Oral BID Rise Patience, MD   50 mg at 09/17/16 0908  . nitroGLYCERIN (NITROSTAT) SL tablet 0.4 mg  0.4 mg Sublingual Q5 min PRN Rise Patience, MD      . omega-3 acid ethyl esters (LOVAZA) capsule 1,000 mg  1,000 mg Oral Daily Rise Patience, MD   1,000 mg at 09/17/16 0908  . pantoprazole (PROTONIX) EC tablet 40 mg  40 mg Oral BID Dron Tanna Furry, MD   40 mg at 09/17/16 0908  . piperacillin-tazobactam (ZOSYN) IVPB 3.375 g  3.375 g Intravenous Q8H Leodis Sias, RPH   3.375 g at 09/17/16 0908  . polyvinyl alcohol (LIQUIFILM TEARS) 1.4 % ophthalmic solution 1 drop  1 drop Both Eyes TID Rise Patience, MD   1 drop at 09/17/16 0909  . predniSONE (DELTASONE) tablet 10 mg  10 mg Oral Q breakfast Rise Patience, MD   10 mg at 09/17/16 0908  . rosuvastatin (CRESTOR) tablet 20 mg  20 mg Oral q1800 Dron Tanna Furry, MD   20 mg at 09/16/16 1737  . vancomycin (VANCOCIN) IVPB 750 mg/150 ml premix  750 mg Intravenous Q24H Leodis Sias, RPH   750 mg at 09/16/16 1737     Discharge Medications: Please see discharge summary for a list of discharge medications.  Relevant Imaging Results:  Relevant Lab Results:   Additional Information SSn: [place], [place]

## 2016-09-17 NOTE — Evaluation (Signed)
Occupational Therapy Evaluation Patient Details Name: [name] MRN: [id number] DOB: [birth year] Today's Date: 09/17/2016    History of Present Illness 83 y.o.femalewith hypertrophic cardiomyopathy, chronic diastolic heart failure, CAD, hyperlipidemia, and essential hypertension, rheumatoid arthritis and hypothyroidism was brought to the ER after patient was found to be getting confused with persistent fever chills. Pt also noted to have increased edema of L wrist.    Clinical Impression   Pt reports she required assist with ADL from staff at SNF PTA. Currently pt requires max assist +2 for stand pivot transfer to [facility]. Requires mod-max assist for ADL with max cues for sequencing and initiation. Anticipate pt is close to her functional baseline but would benefit from acute OT to maintain strength and functional participation in ADL. Plan to return to SNF upon d/c. Will follow acutely.    Follow Up Recommendations  SNF;Supervision/Assistance - 24 hour    Equipment Recommendations  None recommended by OT    Recommendations for Other Services       Precautions / Restrictions Precautions Precautions: Fall Restrictions Weight Bearing Restrictions: No      Mobility Bed Mobility Overal bed mobility: Needs Assistance Bed Mobility: Supine to Sit     Supine to sit: Max assist;+2 for safety/equipment     General bed mobility comments: Pt able to assist with bringing LEs to EOB but max assist with max cues required for trunk elevation and scooting hips out to EOB.  Transfers Overall transfer level: Needs assistance Equipment used: 2 person hand held assist Transfers: Sit to/from [facility] Sit to Stand: Mod assist;+2 physical assistance Stand pivot transfers: Max assist;+2 physical assistance       General transfer comment: Cues for hand placement and stepping feet for pivot transfers. Sit to stand from EOB x1, BSC x1. Stand pivot x2.    Balance Overall  balance assessment: Needs assistance Sitting-balance support: Feet supported;Bilateral upper extremity supported Sitting balance-Leahy Scale: Fair     Standing balance support: Bilateral upper extremity supported Standing balance-Leahy Scale: Poor Standing balance comment: relies on bil UE support                            ADL Overall ADL's : Needs assistance/impaired Eating/Feeding: Set up;Sitting   Grooming: Min guard;Sitting   Upper Body Bathing: Moderate assistance;Sitting   Lower Body Bathing: Maximal assistance;+2 for physical assistance;Sit to/from stand   Upper Body Dressing : Minimal assistance;Sitting   Lower Body Dressing: Maximal assistance;+2 for physical assistance;Sit to/from stand   Toilet Transfer: Maximal assistance;+2 for physical assistance;Stand-pivot;BSC   Toileting- Clothing Manipulation and Hygiene: Total assistance;+2 for physical assistance;Sit to/from stand       Functional mobility during ADLs: Maximal assistance;+2 for physical assistance (for stand pivot only) General ADL Comments: VSS throghout. SpO2=93% on RA with activity. Mild SOB noted after transfers.     Vision Baseline Vision/History: Wears glasses Patient Visual Report: Other (comment) (L eye pain) Additional Comments: Pt c/o L eye pain, kept closed most of session. Redness noted. Pt without c/o diplopia or blurriness.     Perception     Praxis      Pertinent Vitals/Pain Pain Assessment: Faces Faces Pain Scale: Hurts little more Pain Location: L eye Pain Descriptors / Indicators: Burning;Other (Comment) (itching) Pain Intervention(s): Monitored during session     Hand Dominance     Extremity/Trunk Assessment Upper Extremity Assessment Upper Extremity Assessment: Generalized weakness;LUE deficits/detail LUE Deficits / Details: pt without c/o  pain in L wrist this session   Lower Extremity Assessment Lower Extremity Assessment: Defer to PT evaluation    Cervical / Trunk Assessment Cervical / Trunk Assessment: Kyphotic   Communication Communication Communication: No difficulties   Cognition Arousal/Alertness: Awake/alert Behavior During Therapy: Flat affect   Area of Impairment: Problem solving             Problem Solving: Slow processing;Difficulty sequencing;Requires verbal cues General Comments: Requires max cues during functional tasks for sequencing.   General Comments       Exercises       Shoulder Instructions      Home Living Family/patient expects to be discharged to:: Skilled nursing facility                                 Additional Comments: Resident of [facility] the last 5 years      Prior Functioning/Environment Level of Independence: Needs assistance  Gait / Transfers Assistance Needed: Pt reports she ambulates with RW but recently has been performing mobility via w/c ADL's / Homemaking Assistance Needed: Assist from SNF staff with BADL, takes meals in room            OT Problem List: Decreased strength;Decreased activity tolerance;Impaired balance (sitting and/or standing);Decreased knowledge of use of DME or AE;Increased edema      OT Treatment/Interventions: Self-care/ADL training;Therapeutic exercise;Energy conservation;DME and/or AE instruction;Therapeutic activities;Patient/family education;Balance training    OT Goals(Current goals can be found in the care plan section) Acute Rehab OT Goals Patient Stated Goal: return to [facility] OT Goal Formulation: With patient Time For Goal Achievement: 10/01/16 Potential to Achieve Goals: Good ADL Goals Pt Will Perform Grooming: with set-up;sitting Pt Will Transfer to Toilet: with min assist;stand pivot transfer;bedside commode Additional ADL Goal #1: Pt will perform bed mobility with min assist overall as precursor to ADL.  OT Frequency: Min 2X/week   Barriers to D/C:            Co-evaluation PT/OT/SLP  Co-Evaluation/Treatment: Yes Reason for Co-Treatment: For patient/therapist safety;To address functional/ADL transfers PT goals addressed during session: Mobility/safety with mobility OT goals addressed during session: ADL's and self-care      End of Session Equipment Utilized During Treatment: Gait belt Nurse Communication: Mobility status  Activity Tolerance: Patient tolerated treatment well Patient left: in chair;with call bell/phone within reach;with chair alarm set  OT Visit Diagnosis: Unsteadiness on feet (R26.81)                ADL either performed or assessed with clinical judgement  Time: 5003-7048 OT Time Calculation (min): 25 min Charges:  OT General Charges $OT Visit: 1 Procedure OT Evaluation $OT Eval Moderate Complexity: 1 Procedure G-Codes:     Rosealie Reach A. Ulice Brilliant, M.S., OTR/L Pager: [place] 09/17/2016, 2:47 PM

## 2016-09-17 NOTE — Clinical Social Work Note (Signed)
Clinical Social Work Assessment  Patient Details  Name: [name] MRN: [id number] Date of Birth: 1930/01/18  Date of referral:  09/17/16               Reason for consult:  Facility Placement, Discharge Planning                Permission sought to share information with:  Facility Sport and exercise psychologist, Family Supports Permission granted to share information::  Yes, Verbal Permission Granted  Name::     [employer]::  [name]  Relationship::  Son  Contact Information:  [phone]  Housing/Transportation Living arrangements for the past 2 months:  [place] of Information:  Adult Children Patient Interpreter Needed:  None Criminal Activity/Legal Involvement Pertinent to Current Situation/Hospitalization:  No - Comment as needed Significant Relationships:  Adult Children Lives with:  Facility Resident Do you feel safe going back to the place where you live?  Yes Need for family participation in patient care:  Yes (Comment)  Care giving concerns:  CSW received consult regarding discharge planning. Patient is disoriented. CSW spoke with patient's son, [name]. He reported that patient has lived at [facility] for the past 5 years and will return there at discharge. CSW to continue to follow and assist with discharge planning needs.   Social Worker assessment / plan:  CSW spoke with patient's son regarding return to SNF.  Employment status:  Retired Forensic scientist:  Medicare PT Recommendations:  Not assessed at this time Information / Referral to community resources:  [place]  Patient/Family's Response to care:  Patient's son reports agreement with discharge plan and appreciates patient's care.  Patient/Family's Understanding of and Emotional Response to Diagnosis, Current Treatment, and Prognosis:   Patient's son expressed understanding of CSW role and discharge process. He states he is familiar with discharge process since  patient is long term care at [facility]. He reports being happy to get her back to familiar surroundings. No questions/concerns about plan or treatment.    Emotional Assessment Appearance:  Appears stated age Attitude/Demeanor/Rapport:  Unable to Assess Affect (typically observed):  Unable to Assess Orientation:  Oriented to Self Alcohol / Substance use:  Not Applicable Psych involvement (Current and /or in the community):  No (Comment)  Discharge Needs  Concerns to be addressed:  Care Coordination Readmission within the last 30 days:  No Current discharge risk:  None Barriers to Discharge:  Continued Medical Work up   [employer], [place] 09/17/2016, 10:08 AM

## 2016-09-17 NOTE — Plan of Care (Signed)
Problem: Activity: Goal: Risk for activity intolerance will decrease Outcome: Progressing PT ordered 3/20, pt. Declined therapy today, will continue to monitor.

## 2016-09-18 DIAGNOSIS — A419 Sepsis, unspecified organism: Secondary | ICD-10-CM | POA: Diagnosis not present

## 2016-09-18 DIAGNOSIS — R2689 Other abnormalities of gait and mobility: Secondary | ICD-10-CM | POA: Diagnosis not present

## 2016-09-18 DIAGNOSIS — E038 Other specified hypothyroidism: Secondary | ICD-10-CM | POA: Diagnosis not present

## 2016-09-18 DIAGNOSIS — M25532 Pain in left wrist: Secondary | ICD-10-CM | POA: Diagnosis not present

## 2016-09-18 DIAGNOSIS — F329 Major depressive disorder, single episode, unspecified: Secondary | ICD-10-CM | POA: Diagnosis not present

## 2016-09-18 DIAGNOSIS — D508 Other iron deficiency anemias: Secondary | ICD-10-CM | POA: Diagnosis not present

## 2016-09-18 DIAGNOSIS — R488 Other symbolic dysfunctions: Secondary | ICD-10-CM | POA: Diagnosis not present

## 2016-09-18 DIAGNOSIS — L03114 Cellulitis of left upper limb: Secondary | ICD-10-CM | POA: Diagnosis not present

## 2016-09-18 DIAGNOSIS — M069 Rheumatoid arthritis, unspecified: Secondary | ICD-10-CM | POA: Diagnosis not present

## 2016-09-18 DIAGNOSIS — J309 Allergic rhinitis, unspecified: Secondary | ICD-10-CM | POA: Diagnosis not present

## 2016-09-18 DIAGNOSIS — I421 Obstructive hypertrophic cardiomyopathy: Secondary | ICD-10-CM | POA: Diagnosis not present

## 2016-09-18 DIAGNOSIS — L03119 Cellulitis of unspecified part of limb: Secondary | ICD-10-CM | POA: Diagnosis not present

## 2016-09-18 DIAGNOSIS — D696 Thrombocytopenia, unspecified: Secondary | ICD-10-CM | POA: Diagnosis not present

## 2016-09-18 DIAGNOSIS — E440 Moderate protein-calorie malnutrition: Secondary | ICD-10-CM | POA: Diagnosis not present

## 2016-09-18 DIAGNOSIS — R6520 Severe sepsis without septic shock: Secondary | ICD-10-CM | POA: Diagnosis not present

## 2016-09-18 DIAGNOSIS — N3000 Acute cystitis without hematuria: Secondary | ICD-10-CM | POA: Diagnosis not present

## 2016-09-18 DIAGNOSIS — I130 Hypertensive heart and chronic kidney disease with heart failure and stage 1 through stage 4 chronic kidney disease, or unspecified chronic kidney disease: Secondary | ICD-10-CM | POA: Diagnosis not present

## 2016-09-18 DIAGNOSIS — R531 Weakness: Secondary | ICD-10-CM | POA: Diagnosis not present

## 2016-09-18 DIAGNOSIS — M6281 Muscle weakness (generalized): Secondary | ICD-10-CM | POA: Diagnosis not present

## 2016-09-18 DIAGNOSIS — I825Y9 Chronic embolism and thrombosis of unspecified deep veins of unspecified proximal lower extremity: Secondary | ICD-10-CM | POA: Diagnosis not present

## 2016-09-18 DIAGNOSIS — I5030 Unspecified diastolic (congestive) heart failure: Secondary | ICD-10-CM | POA: Diagnosis not present

## 2016-09-18 DIAGNOSIS — N390 Urinary tract infection, site not specified: Secondary | ICD-10-CM | POA: Diagnosis not present

## 2016-09-18 DIAGNOSIS — M25432 Effusion, left wrist: Secondary | ICD-10-CM | POA: Diagnosis not present

## 2016-09-18 DIAGNOSIS — E039 Hypothyroidism, unspecified: Secondary | ICD-10-CM | POA: Diagnosis not present

## 2016-09-18 DIAGNOSIS — F339 Major depressive disorder, recurrent, unspecified: Secondary | ICD-10-CM | POA: Diagnosis not present

## 2016-09-18 DIAGNOSIS — M1990 Unspecified osteoarthritis, unspecified site: Secondary | ICD-10-CM | POA: Diagnosis not present

## 2016-09-18 DIAGNOSIS — G9340 Encephalopathy, unspecified: Secondary | ICD-10-CM | POA: Diagnosis not present

## 2016-09-18 DIAGNOSIS — K279 Peptic ulcer, site unspecified, unspecified as acute or chronic, without hemorrhage or perforation: Secondary | ICD-10-CM | POA: Diagnosis not present

## 2016-09-18 DIAGNOSIS — D638 Anemia in other chronic diseases classified elsewhere: Secondary | ICD-10-CM | POA: Diagnosis not present

## 2016-09-18 DIAGNOSIS — E782 Mixed hyperlipidemia: Secondary | ICD-10-CM | POA: Diagnosis not present

## 2016-09-18 DIAGNOSIS — N183 Chronic kidney disease, stage 3 (moderate): Secondary | ICD-10-CM | POA: Diagnosis not present

## 2016-09-18 LAB — BASIC METABOLIC PANEL
ANION GAP: 8 (ref 5–15)
BUN: 16 mg/dL (ref 6–20)
CALCIUM: 7.9 mg/dL — AB (ref 8.9–10.3)
CO2: 26 mmol/L (ref 22–32)
Chloride: 108 mmol/L (ref 101–111)
Creatinine, Ser: 1.53 mg/dL — ABNORMAL HIGH (ref 0.44–1.00)
GFR, EST AFRICAN AMERICAN: 35 mL/min — AB (ref >60)
GFR, EST NON AFRICAN AMERICAN: 30 mL/min — AB (ref >60)
Glucose, Bld: 86 mg/dL (ref 65–99)
Potassium: 3.1 mmol/L — ABNORMAL LOW (ref 3.5–5.1)
SODIUM: 142 mmol/L (ref 135–145)

## 2016-09-18 MED ORDER — AMOXICILLIN-POT CLAVULANATE 875-125 MG PO TABS: 1.0000 | ORAL_TABLET | Freq: Two times a day (BID) | ORAL | 0 refills | Status: AC

## 2016-09-18 MED ORDER — ERYTHROMYCIN 5 MG/GM OP OINT: TOPICAL_OINTMENT | Freq: Three times a day (TID) | OPHTHALMIC | 0 refills | Status: DC

## 2016-09-18 MED ORDER — LEVOTHYROXINE SODIUM 25 MCG PO TABS: 25.0000 ug | ORAL_TABLET | Freq: Every day | ORAL | Status: AC

## 2016-09-18 MED ORDER — POTASSIUM CHLORIDE CRYS ER 20 MEQ PO TBCR
40.0000 meq | EXTENDED_RELEASE_TABLET | Freq: Every day | ORAL | Status: DC
  Administered 2016-09-18: 40 meq via ORAL
  Filled 2016-09-18: qty 2

## 2016-09-18 MED ORDER — POTASSIUM CHLORIDE CRYS ER 20 MEQ PO TBCR: 20.0000 meq | EXTENDED_RELEASE_TABLET | Freq: Every day | ORAL | 0 refills | Status: DC

## 2016-09-18 NOTE — Progress Notes (Signed)
Report given to Arloa Koh, LPN at [facility].

## 2016-09-18 NOTE — Progress Notes (Signed)
Patient will DC to: [name] Anticipated DC date: 09/18/16 Family notified: Daughter and Son Transport by: Glenna Fellows   Per MD patient ready for DC to [facility] RN, patient, patient's family, and facility notified of DC. Discharge Summary sent to facility. RN given number for report. DC packet on chart. Ambulance transport requested for patient.   CSW signing off.  Cedric Fishman, [place] Social Worker [phone]

## 2016-09-18 NOTE — Discharge Summary (Signed)
Physician Discharge Summary  [name] RCV:[id number] DOB: 1930-05-26 DOA: 09/15/2016  PCP: [name], MD  Admit date: 09/15/2016 Discharge date: 09/18/2016  Admitted From:SNF Disposition:SNF  Recommendations for Outpatient Follow-up:  1. Follow up with PCP in 1-2 weeks 2. Please obtain BMP/CBC in one week  Home Health:SNF Equipment/Devices:no Discharge Condition:stable CODE STATUS:full Diet recommendation:heart healthy  Brief/Interim Summary: 83 y.o.femalewith hypertrophic cardiomyopathy, chronic diastolic heart failure, CAD, hyperlipidemia, and essential hypertension, rheumatoid arthritis and hypothyroidism was brought to the ER after patient was found to be  confused with persistent fever chills.  ED Course:Patient was found to be febrile and tachycardic and UA shows features consistent with UTI. On exam patient has left wrist redness. Patient is being admitted for sepsis most likely from UTI and possible cellulitis. Chest x-ray was unremarkable. CT of the head was showing nothing acute.  # Presumed Sepsis due to UTI and possible Left wrist cellulitis:  -Patient is continued to improve clinically. Left wrist cellulitis looks improved. Urine culture growing corynebacterium ? Contaminant. Plan to discharge with oral augmentin for 5 days. Pt is immunocompromised.  -Blood cultures are negative so far. I discussed this with the patient's daughter and granddaughter at bedside. Patient is clinically improved. - Left wrist x-ray showed possible erosive arthritis with no evidence of fracture or dislocation. -Continue supportive care.  #Acute cystitis without hematuria, site unspecified: On Augmentin on discharge. Patient denies urinary symptoms.  #Acute kidney injury on chronic knee disease stage III: Likely in the setting of UTI. Serum creatinine level improving and is stable. Recommended to monitor labs as an outpatient. Started oral potassium for the treatment of  hyperkalemia.  #fall/Confusion, probable acute metabolic encephalopathy in the setting of UTI: Patient is alert awake and oriented. Mental status significantly improved.  -Ordered PT, OT and Copywriter, advertising.  # History of rheumatoid arthritis on chronic prednisone: Continue home dose of prednisone. Received a dose of hydrocortisone at stress dose on admission.  #History of DVT: Resume home dose of Eliquis. Clinically stable.  # History of hypothyroidism:  TSH level low therefore I would reduce the dose of Synthroid. Recommended to monitor thyroid function test in 4-6 weeks.  #History of obstructive hypertrophic cardiomyopathy: Continue metoprolol.  Patient is clinically improved. Hand cellulitis improved. Denies any complaints. Able to tolerate diet. Mental status improved. Discussed with the patient's daughter and granddaughter at bedside.  Eye exam with possible corneal aberration. She has no problem with her vision and redness has improved. Plan to continue eyedrops and added erythromycin for 2 more days. Recommended to follow up with PCP and if no improvement follow up with ophthalmologist.  Discharge Diagnoses:  Principal Problem:   Sepsis ([place]) Active Problems:   RA (rheumatoid arthritis) ([place])   Obstructive hypertrophic cardiomyopathy (H[facility])   Chronic diastolic CHF (congestive heart failure) (H[facility])   Pulmonary hypertension   CKD (chronic kidney disease) stage 3, GFR 30-59 ml/min   UTI (urinary tract infection)   Wrist swelling, left   Acute encephalopathy   Pain and swelling of left wrist    Discharge Instructions  Discharge Instructions    Call MD for:  difficulty breathing, headache or visual disturbances    Complete by:  As directed    Call MD for:  extreme fatigue    Complete by:  As directed    Call MD for:  hives    Complete by:  As directed    Call MD for:  persistant dizziness or light-headedness    Complete by:  As  directed    Call MD for:   persistant nausea and vomiting    Complete by:  As directed    Call MD for:  severe uncontrolled pain    Complete by:  As directed    Call MD for:  temperature >100.4    Complete by:  As directed    Diet - low sodium heart healthy    Complete by:  As directed    Discharge instructions    Complete by:  As directed    Please check thyroid function test in 4-6 weeks.   Increase activity slowly    Complete by:  As directed      Allergies as of 09/18/2016      Reactions   Codeine Swelling   Sulfa Antibiotics Other (See Comments)   GI upset   Biphosphate Other (See Comments)   Morphine And Related Other (See Comments)   Percocet [oxycodone-acetaminophen] Other (See Comments)   Plavix [clopidogrel Bisulfate] Itching, Rash      Medication List    STOP taking these medications   nitrofurantoin (macrocrystal-monohydrate) 100 MG capsule Commonly known as:  MACROBID     TAKE these medications   ACT DRY MOUTH Lozg Use as directed in the mouth or throat as needed (for dry mouth).   amoxicillin-clavulanate 875-125 MG tablet Commonly known as:  AUGMENTIN Take 1 tablet by mouth 2 (two) times daily.   aspirin EC 81 MG tablet Take 81 mg by mouth at bedtime.   BIOFREEZE 4 % Gel Generic drug:  Menthol (Topical Analgesic) Apply 1 application topically every 8 (eight) hours. For wrist pain   calcium-vitamin D 500-200 MG-UNIT tablet Commonly known as:  OSCAL WITH D Take 1 tablet by mouth 3 (three) times daily.   carboxymethylcellulose 1 % ophthalmic solution Apply 1 drop to eye 3 (three) times daily. Both eyes   CERTAVITE/ANTIOXIDANTS PO Take 1 tablet by mouth daily.   cetirizine 10 MG tablet Commonly known as:  ZYRTEC Take 10 mg by mouth daily.   Cranberry 200 MG Caps Take 2 capsules by mouth 2 (two) times daily.   denosumab 60 MG/ML Soln injection Commonly known as:  PROLIA Inject 60 mg into the skin every 6 (six) months. Administer in upper arm, thigh, or abdomen    diclofenac sodium 1 % Gel Commonly known as:  VOLTAREN Apply 2 g topically 2 (two) times daily. For shoulder and wrist pain   ELIQUIS 5 MG Tabs tablet Generic drug:  apixaban Take 5 mg by mouth 2 (two) times daily. For DVT   erythromycin ophthalmic ointment Place into the left eye 3 (three) times daily. For 2 day   esomeprazole 40 MG capsule Commonly known as:  NEXIUM Take 40 mg by mouth 2 (two) times daily before a meal.   ferrous sulfate 325 (65 FE) MG tablet Take 325 mg by mouth 3 (three) times daily with meals.   Fish Oil 1000 MG Cpdr Take 1,000 mg by mouth daily.   hydrochlorothiazide 12.5 MG tablet Commonly known as:  HYDRODIURIL Take 1 tablet (12.5 mg total) by mouth daily.   HYDROcodone-acetaminophen 5-325 MG tablet Commonly known as:  NORCO/VICODIN Take one tablet by mouth twice daily for pain. Do not exceed 3gm of Tylenol in 24 hours What changed:  how much to take  how to take this  when to take this  additional instructions   ipratropium 0.03 % nasal spray Commonly known as:  ATROVENT Place 2 sprays into both nostrils daily as needed (  For asthma).   ipratropium-albuterol 0.5-2.5 (3) MG/3ML Soln Commonly known as:  DUONEB Take 3 mLs by nebulization every 8 (eight) hours as needed (wheezing).   levothyroxine 25 MCG tablet Commonly known as:  SYNTHROID, LEVOTHROID Take 1 tablet (25 mcg total) by mouth daily before breakfast. Start taking on:  09/19/2016 What changed:  medication strength  how much to take  when to take this   lidocaine 5 % Commonly known as:  LIDODERM Place 1 patch onto the skin daily. Remove & Discard patch within 12 hours or as directed by MD   LORazepam 0.5 MG tablet Commonly known as:  ATIVAN Take one tablet by mouth every night at bedtime for anxiety What changed:  how much to take  how to take this  when to take this  additional instructions   meclizine 12.5 MG tablet Commonly known as:  ANTIVERT Take 12.5  mg by mouth 2 (two) times daily.   methocarbamol 500 MG tablet Commonly known as:  ROBAXIN Take 500 mg by mouth at bedtime. For muscle spasms/shoulder pain.   metoprolol 50 MG tablet Commonly known as:  LOPRESSOR Take 50 mg by mouth 2 (two) times daily.   montelukast 10 MG tablet Commonly known as:  SINGULAIR Take 10 mg by mouth at bedtime.   nitroGLYCERIN 0.4 MG SL tablet Commonly known as:  NITROSTAT Place 0.4 mg under the tongue every 5 (five) minutes as needed for chest pain.   ondansetron 4 MG tablet Commonly known as:  ZOFRAN Take 4 mg by mouth every 6 (six) hours as needed for nausea or vomiting.   OXYGEN Inhale 2 L into the lungs as needed (To maintain saturations >90%).   PATADAY 0.2 % Soln Generic drug:  Olopatadine HCl Place 1 drop into both eyes daily.   potassium chloride SA 20 MEQ tablet Commonly known as:  K-DUR,KLOR-CON Take 1 tablet (20 mEq total) by mouth daily. Start taking on:  09/19/2016   predniSONE 10 MG tablet Commonly known as:  DELTASONE Take 10 mg by mouth daily. For RA   promethazine 25 MG tablet Commonly known as:  PHENERGAN Take 25 mg by mouth every 12 (twelve) hours as needed for nausea or vomiting.   rosuvastatin 20 MG tablet Commonly known as:  CRESTOR Take 20 mg by mouth every evening. For hyperlipidemia   sertraline 50 MG tablet Commonly known as:  ZOLOFT Take 50 mg by mouth every morning. For depression   Vitamin D 2000 units Caps Take 1 capsule (2,000 Units total) by mouth daily.      Follow-up Information    [name], MD. Schedule an appointment as soon as possible for a visit in 1 week(s).   Specialty:  Internal Medicine Contact information: [address] [place] [address] [phone]          Allergies  Allergen Reactions  . Codeine Swelling  . Sulfa Antibiotics Other (See Comments)    GI upset  . Biphosphate Other (See Comments)  . Morphine And Related Other (See Comments)  . Percocet  [Oxycodone-Acetaminophen] Other (See Comments)  . Plavix [Clopidogrel Bisulfate] Itching and Rash    Consultations: None  Procedures/Studies: On IV vancomycin and Zosyn since 3/19-3/22  Subjective: Patient was seen and examined at bedside. She reported as well. Denied headache, dizziness, nausea, vomiting, chest pain or shortness of breath. Able to tolerate diet well.  Discharge Exam: Vitals:   09/18/16 0644 09/18/16 1143  BP: (!) 187/82 (!) 151/81  Pulse:  73  Resp:  (!) 22  Temp:  97.7 F (36.5 C)   Vitals:   09/17/16 2240 09/18/16 0617 09/18/16 0644 09/18/16 1143  BP: (!) 149/75 (!) 178/82 (!) 187/82 (!) 151/81  Pulse: 84 75  73  Resp: 18 18  (!) 22  Temp: 98.7 F (37.1 C) 98.7 F (37.1 C)  97.7 F (36.5 C)  TempSrc: Oral Oral  Oral  SpO2: 97% 98%  100%  Weight:  64.6 kg (142 lb 8 oz)      General: Pt is alert, awake, not in acute distress Eye: No redness in both eyes and has good vision. Cardiovascular: RRR, S1/S2 +, no rubs, no gallops Respiratory: CTA bilaterally, no wheezing, no rhonchi Abdominal: Soft, NT, ND, bowel sounds + Extremities: no edema, no cyanosis    The results of significant diagnostics from this hospitalization (including imaging, microbiology, ancillary and laboratory) are listed below for reference.     Microbiology: Recent Results (from the past 240 hour(s))  Culture, blood (Routine x 2)     Status: None (Preliminary result)   Collection Time: 09/15/16  2:26 PM  Result Value Ref Range Status   Specimen Description BLOOD RIGHT HAND  Final   Special Requests BOTTLES DRAWN AEROBIC ONLY 5CC  Final   Culture NO GROWTH 3 DAYS  Final   Report Status PENDING  Incomplete  Culture, blood (Routine x 2)     Status: None (Preliminary result)   Collection Time: 09/15/16  2:33 PM  Result Value Ref Range Status   Specimen Description BLOOD RIGHT FOREARM  Final   Special Requests BOTTLES DRAWN AEROBIC ONLY 5CC  Final   Culture NO GROWTH 3 DAYS   Final   Report Status PENDING  Incomplete  Urine culture     Status: Abnormal   Collection Time: 09/15/16  5:32 PM  Result Value Ref Range Status   Specimen Description URINE, CATHETERIZED  Final   Special Requests ADD ON 2140 [id number]  Final   Culture (A)  Final    80,000 COLONIES/mL DIPHTHEROIDS(CORYNEBACTERIUM SPECIES) Standardized susceptibility testing for this organism is not available.    Report Status 09/17/2016 FINAL  Final  MRSA PCR Screening     Status: None   Collection Time: 09/15/16  9:08 PM  Result Value Ref Range Status   MRSA by PCR NEGATIVE NEGATIVE Final    Comment:        The GeneXpert MRSA Assay (FDA approved for NASAL specimens only), is one component of a comprehensive MRSA colonization surveillance program. It is not intended to diagnose MRSA infection nor to guide or monitor treatment for MRSA infections.      Labs: BNP (last 3 results) No results for input(s): BNP in the last 8760 hours. Basic Metabolic Panel:  Recent Labs Lab 09/15/16 1346 09/16/16 0615 09/17/16 1043 09/18/16 0827  NA 139 141 142 142  K 3.8 4.0 3.1* 3.1*  CL 105 105 109 108  CO2 24 25 24 26   GLUCOSE 141* 143* 95 86  BUN 27* 23* 21* 16  CREATININE 1.88* 1.68* 1.58* 1.53*  CALCIUM 8.8* 8.3* 8.0* 7.9*   Liver Function Tests:  Recent Labs Lab 09/15/16 1346 09/16/16 0615  AST 32 25  ALT 17 14  ALKPHOS 43 44  BILITOT 0.6 0.6  PROT 5.2* 4.7*  ALBUMIN 2.7* 2.2*   No results for input(s): LIPASE, AMYLASE in the last 168 hours. No results for input(s): AMMONIA in the last 168 hours. CBC:  Recent Labs Lab 09/15/16 1346 09/16/16  0615 09/17/16 1043  WBC 15.2* 10.1 9.0  NEUTROABS 12.7* 8.8*  --   HGB 10.4* 9.4* 9.3*  HCT 33.4* 30.0* 29.4*  MCV 89.1 88.5 87.8  PLT 133* 127* 122*   Cardiac Enzymes:  Recent Labs Lab 09/16/16 0615  TROPONINI 0.09*   BNP: Invalid input(s): POCBNP CBG:  Recent Labs Lab 09/16/16 0816 09/16/16 1246  GLUCAP 122* 105*    D-Dimer No results for input(s): DDIMER in the last 72 hours. Hgb A1c No results for input(s): HGBA1C in the last 72 hours. Lipid Profile No results for input(s): CHOL, HDL, LDLCALC, TRIG, CHOLHDL, LDLDIRECT in the last 72 hours. Thyroid function studies  Recent Labs  09/16/16 1636  TSH 0.251*   Anemia work up No results for input(s): VITAMINB12, FOLATE, FERRITIN, TIBC, IRON, RETICCTPCT in the last 72 hours. Urinalysis    Component Value Date/Time   COLORURINE YELLOW 09/15/2016 1732   APPEARANCEUR HAZY (A) 09/15/2016 1732   APPEARANCEUR Hazy 03/06/2013 1013   LABSPEC 1.019 09/15/2016 1732   LABSPEC 1.013 03/06/2013 1013   PHURINE 5.0 09/15/2016 1732   GLUCOSEU NEGATIVE 09/15/2016 1732   GLUCOSEU Negative 03/06/2013 1013   HGBUR NEGATIVE 09/15/2016 1732   BILIRUBINUR NEGATIVE 09/15/2016 1732   BILIRUBINUR Negative 03/06/2013 1013   KETONESUR NEGATIVE 09/15/2016 1732   PROTEINUR 30 (A) 09/15/2016 1732   UROBILINOGEN 0.2 12/18/2012 1203   NITRITE NEGATIVE 09/15/2016 1732   LEUKOCYTESUR MODERATE (A) 09/15/2016 1732   LEUKOCYTESUR 3+ 03/06/2013 1013   Sepsis Labs Invalid input(s): PROCALCITONIN,  WBC,  LACTICIDVEN Microbiology Recent Results (from the past 240 hour(s))  Culture, blood (Routine x 2)     Status: None (Preliminary result)   Collection Time: 09/15/16  2:26 PM  Result Value Ref Range Status   Specimen Description BLOOD RIGHT HAND  Final   Special Requests BOTTLES DRAWN AEROBIC ONLY 5CC  Final   Culture NO GROWTH 3 DAYS  Final   Report Status PENDING  Incomplete  Culture, blood (Routine x 2)     Status: None (Preliminary result)   Collection Time: 09/15/16  2:33 PM  Result Value Ref Range Status   Specimen Description BLOOD RIGHT FOREARM  Final   Special Requests BOTTLES DRAWN AEROBIC ONLY 5CC  Final   Culture NO GROWTH 3 DAYS  Final   Report Status PENDING  Incomplete  Urine culture     Status: Abnormal   Collection Time: 09/15/16  5:32 PM  Result  Value Ref Range Status   Specimen Description URINE, CATHETERIZED  Final   Special Requests ADD ON 2140 [id number]  Final   Culture (A)  Final    80,000 COLONIES/mL DIPHTHEROIDS(CORYNEBACTERIUM SPECIES) Standardized susceptibility testing for this organism is not available.    Report Status 09/17/2016 FINAL  Final  MRSA PCR Screening     Status: None   Collection Time: 09/15/16  9:08 PM  Result Value Ref Range Status   MRSA by PCR NEGATIVE NEGATIVE Final    Comment:        The GeneXpert MRSA Assay (FDA approved for NASAL specimens only), is one component of a comprehensive MRSA colonization surveillance program. It is not intended to diagnose MRSA infection nor to guide or monitor treatment for MRSA infections.      Time coordinating discharge: 31 minutes  SIGNED:   Rosita Fire, MD  [facility] 09/18/2016, 11:57 AM  If 7PM-7AM, please contact night-coverage [email or web address] Password [email or web address]

## 2016-09-18 NOTE — Progress Notes (Addendum)
BP elevated, pt c/o pain, pain med given,MD notified.

## 2016-09-18 NOTE — Progress Notes (Signed)
Follow-up: Was ask by my colleague, Dr Carolin Sicks to evaluate pt for c/o pain and redness to (L) eye that is worsening. At bedside pt noted lying in bed in NAD though is noted rubbing her (L) eye. Pt reports having discomfort to her eye yesterday but it seemed better this am. However she reports the pain has returned and worsened through-out the day. States it feels like there is a piece of sand in her eye.  Pt denies initially having similar symptoms in the past. She initially is unable to open her to assess vision d/t pain and is sensitive to light. Tetracaine 0.5% 2 drops instilled into (L) eye. Once pt adequately anesthetized pt was able to open eyes and is able to count fingers held up with (R) eye covered. Periorbital erythema noted w/o significant swelling. No conjunctival exudate and minimal conjunctival erythema. After instillation of fluri-strip,  woods lamp exam reveals significant corneal fluorescein  uptake in the center of pt's eye just over the iris. When discussing w/ pt she then admitted she has had a similar episode in the past after applying make-up.  Assessment/Plan: 1. Pain and redness to (L) eye: Exam most c/w corneal abrasion. In the absence of significant swelling, conjunctival erythema or fever no imaging indicated at this time. Will start treatment with Erythromycin ointment q6h tonight. Will patch the eye just for tonight to discourage touching and rubbing the eye. Norco for pain. Rounding MD to re-assess in am to determine improvement vs need for further investigation as well as inpt vs outpt opthalmology consult/referral.  Discussed plan with pt who is agreeable with plan.  Jeryl [place], NP-C [facility] Pager [phone]

## 2016-09-19 ENCOUNTER — Telehealth: Payer: Self-pay

## 2016-09-19 NOTE — Telephone Encounter (Signed)
This is a patient of [place], who was admitted to [facility] after hospitalization. [facility] F/U is needed. Hospital discharge from [facility] on 09/18/2016.

## 2016-09-20 LAB — CULTURE, BLOOD (ROUTINE X 2)
CULTURE: NO GROWTH
Culture: NO GROWTH

## 2016-09-22 ENCOUNTER — Encounter: Payer: Self-pay | Admitting: Internal Medicine

## 2016-09-22 ENCOUNTER — Non-Acute Institutional Stay (SKILLED_NURSING_FACILITY): Payer: Medicare Other | Admitting: Internal Medicine

## 2016-09-22 DIAGNOSIS — L03119 Cellulitis of unspecified part of limb: Secondary | ICD-10-CM

## 2016-09-22 DIAGNOSIS — K279 Peptic ulcer, site unspecified, unspecified as acute or chronic, without hemorrhage or perforation: Secondary | ICD-10-CM | POA: Diagnosis not present

## 2016-09-22 DIAGNOSIS — M19011 Primary osteoarthritis, right shoulder: Secondary | ICD-10-CM

## 2016-09-22 DIAGNOSIS — R531 Weakness: Secondary | ICD-10-CM | POA: Diagnosis not present

## 2016-09-22 DIAGNOSIS — D696 Thrombocytopenia, unspecified: Secondary | ICD-10-CM | POA: Diagnosis not present

## 2016-09-22 DIAGNOSIS — E440 Moderate protein-calorie malnutrition: Secondary | ICD-10-CM | POA: Diagnosis not present

## 2016-09-22 DIAGNOSIS — N183 Chronic kidney disease, stage 3 unspecified: Secondary | ICD-10-CM

## 2016-09-22 DIAGNOSIS — J309 Allergic rhinitis, unspecified: Secondary | ICD-10-CM

## 2016-09-22 DIAGNOSIS — N3000 Acute cystitis without hematuria: Secondary | ICD-10-CM | POA: Diagnosis not present

## 2016-09-22 DIAGNOSIS — E876 Hypokalemia: Secondary | ICD-10-CM

## 2016-09-22 DIAGNOSIS — F339 Major depressive disorder, recurrent, unspecified: Secondary | ICD-10-CM | POA: Diagnosis not present

## 2016-09-22 DIAGNOSIS — E038 Other specified hypothyroidism: Secondary | ICD-10-CM | POA: Diagnosis not present

## 2016-09-22 DIAGNOSIS — D638 Anemia in other chronic diseases classified elsewhere: Secondary | ICD-10-CM | POA: Diagnosis not present

## 2016-09-22 DIAGNOSIS — I10 Essential (primary) hypertension: Secondary | ICD-10-CM

## 2016-09-22 DIAGNOSIS — I825Y9 Chronic embolism and thrombosis of unspecified deep veins of unspecified proximal lower extremity: Secondary | ICD-10-CM | POA: Diagnosis not present

## 2016-09-22 NOTE — Progress Notes (Signed)
LOCATION: [name]  PCP: [name], MD   Code Status: Full Code  Goals of care: Advanced Directive information Advanced Directives 09/15/2016  Does Patient Have a Medical Advance Directive? No  Type of Advance Directive -  Does patient want to make changes to medical advance directive? -  Copy of [place] in Chart? -  Would patient like information on creating a medical advance directive? No - Patient declined  Pre-existing out of facility DNR order (yellow form or pink MOST form) -       Extended Emergency Contact Information Primary Emergency Contact: Duncombe,[name] Address: [address], [place] Phone: [phone] Mobile Phone: [phone] Relation: Son Secondary Emergency Contact: [name] [place] Phone: [phone] Mobile Phone: [phone] Relation: Daughter   Allergies  Allergen Reactions  . Codeine Swelling  . Sulfa Antibiotics Other (See Comments)    GI upset  . Biphosphate Other (See Comments)  . Morphine And Related Other (See Comments)  . Percocet [Oxycodone-Acetaminophen] Other (See Comments)  . Plavix [Clopidogrel Bisulfate] Itching and Rash    Chief Complaint  Patient presents with  . Readmit To SNF    Readmission Visit      HPI:  Patient is a 83 y.o. female seen today for Long-term care post hospital readmission from 19th of March 2018-22nd of March 2018 with sepsis from urinary tract infection and left wrist cellulitis. Urine culture grew corynebacterium that was suggestive of possible contaminant. Given her immunosuppressive state with her being on chronic prednisone for rheumatoid arthritis, she was treated with Augmentin and is to complete a five-day course. She also had acute on chronic kidney disease thought to be secondary to the ongoing infection. She has medical history of rheumatoid arthritis, chronic DVT, chronic diastolic heart  failure, coronary artery disease, hyperlipidemia, hypothyroidism, among others. She is seen in her room today.  Review of Systems:  Constitutional: Negative for fever, chills, diaphoresis. she feels weak and tired. HENT: Negative for headache, congestion, nasal discharge, sore throat. Positive for occasional difficulty swallowing with solids and liquids.   Eyes: Negative for eye pain, double vision and discharge. Positive for decreased vision to left eye Respiratory: Negative for cough, shortness of breath and wheezing.   Cardiovascular: Negative for chest pain, palpitations, leg swelling.  Gastrointestinal: Negative for heartburn, nausea, vomiting, abdominal pain, loss of appetite, melena, diarrhea and constipation. Last bowel movement was yesterday. Genitourinary: Negative for dysuria and flank pain.  Musculoskeletal: Negative for back pain, fall in the facility.  she is a high fall risk. Skin: Negative for itching, rash.  Neurological: Positive for occasional dizziness. Psychiatric/Behavioral: Negative for depression.   Past Medical History:  Diagnosis Date  . Anxiety   . Arthritis   . Chronic diastolic CHF (congestive heart failure) ([place])    a. 10/2014 Echo: EF 60-65%, mild LVH, grade 1 DD, dynamic obstruction @ rest - peak velocity of 271 cm/sec, peak grad of 26mmHg, PASP 67mmHg.  Marland Kitchen Coronary artery disease    a. 2012 s/p NSTEMI/Cath: 95% apical LAD dzs, otw nonobs dzs-->Med Rx;  b. 05/2011 MV: no ischemia, EF 79%, inf infarct- attenuation.  . CVA (cerebral vascular accident) ([place])   . Depression   . DVT of lower extremity (deep venous thrombosis) ([place])   . Dyspnea   . Elevated troponin    a. 10/2014 in setting of sepsis/?HCAP.  Marland Kitchen Enteritis due to Clostridium  difficile 01/04/2013  . Essential hypertension   . Fall   . Fatigue   . GERD (gastroesophageal reflux disease)   . GI bleed 2005  . HCAP (healthcare-associated pneumonia) 01/22/2013  . Hemorrhoids   . Hiatal hernia   .  Hyperlipidemia   . Hypothyroidism   . Malnutrition ([place])   . Obstructive hypertrophic cardiomyopathy ([place])    a. 10/2014 Echo: EF 60-65%, mild LVH, grade 1 DD, dynamic obstruction @ rest - peak velocity of 271 cm/sec, peak grad of 52mmHg, PASP 43mmHg.  . Osteoporosis   . PUD (peptic ulcer disease)   . RA (rheumatoid arthritis) ([place])   . Recurrent colitis due to Clostridium difficile 06/02/2012  . Renal insufficiency   . Right ear pain   . Sepsis ([place]) 12/19/2012  . TIA (transient ischemic attack)   . Weight loss    Past Surgical History:  Procedure Laterality Date  . ANKLE SURGERY    . BACK SURGERY    . [place]   x3  . CARDIAC CATHETERIZATION     SHOWED RUPTURE PLAQUE IN THE LAD. THE LAD IS NONOBSTRUCTIVE WITH ONLY 30-40% STENOSIS  . COLONOSCOPY    . KNEE SURGERY    . NSTEMI  06/2010  . SPINAL FUSION SURGERY     Social History:   reports that she has never smoked. She has never used smokeless tobacco. She reports that she does not drink alcohol or use drugs.  Family History  Problem Relation Age of Onset  . Kidney disease Mother   . Kidney disease Brother   . Lung cancer Father     Medications: Allergies as of 09/22/2016      Reactions   Codeine Swelling   Sulfa Antibiotics Other (See Comments)   GI upset   Biphosphate Other (See Comments)   Morphine And Related Other (See Comments)   Percocet [oxycodone-acetaminophen] Other (See Comments)   Plavix [clopidogrel Bisulfate] Itching, Rash      Medication List       Accurate as of 09/22/16 11:01 AM. Always use your most recent med list.          ACT DRY MOUTH Lozg Use as directed in the mouth or throat as needed (for dry mouth).   amoxicillin-clavulanate 875-125 MG tablet Commonly known as:  AUGMENTIN Take 1 tablet by mouth 2 (two) times daily.   aspirin EC 81 MG tablet Take 81 mg by mouth at bedtime.   BIOFREEZE 4 % Gel Generic drug:  Menthol (Topical Analgesic) Apply 1 application topically  every 8 (eight) hours. For wrist pain   calcium-vitamin D 500-200 MG-UNIT tablet Commonly known as:  OSCAL WITH D Take 1 tablet by mouth 3 (three) times daily.   carboxymethylcellulose 1 % ophthalmic solution Apply 1 drop to eye 3 (three) times daily. Both eyes   CERTAVITE/ANTIOXIDANTS PO Take 1 tablet by mouth daily.   cetirizine 10 MG tablet Commonly known as:  ZYRTEC Take 10 mg by mouth daily.   Cranberry 200 MG Caps Take 2 capsules by mouth 2 (two) times daily.   denosumab 60 MG/ML Soln injection Commonly known as:  PROLIA Inject 60 mg into the skin every 6 (six) months. Administer in upper arm, thigh, or abdomen   diclofenac sodium 1 % Gel Commonly known as:  VOLTAREN Apply 2 g topically 2 (two) times daily. For shoulder and wrist pain   ELIQUIS 5 MG Tabs tablet Generic drug:  apixaban Take 5 mg by mouth 2 (two) times  daily. For DVT   esomeprazole 40 MG capsule Commonly known as:  NEXIUM Take 40 mg by mouth 2 (two) times daily before a meal.   ferrous sulfate 325 (65 FE) MG tablet Take 325 mg by mouth 3 (three) times daily with meals.   Fish Oil 1000 MG Cpdr Take 1,000 mg by mouth daily.   hydrochlorothiazide 12.5 MG tablet Commonly known as:  HYDRODIURIL Take 1 tablet (12.5 mg total) by mouth daily.   HYDROcodone-acetaminophen 5-325 MG tablet Commonly known as:  NORCO/VICODIN Take one tablet by mouth twice daily for pain. Do not exceed 3gm of Tylenol in 24 hours   ipratropium 0.03 % nasal spray Commonly known as:  ATROVENT Place 2 sprays into both nostrils daily as needed (For asthma).   ipratropium-albuterol 0.5-2.5 (3) MG/3ML Soln Commonly known as:  DUONEB Take 3 mLs by nebulization every 8 (eight) hours as needed (wheezing).   levothyroxine 25 MCG tablet Commonly known as:  SYNTHROID, LEVOTHROID Take 1 tablet (25 mcg total) by mouth daily before breakfast.   lidocaine 5 % Commonly known as:  LIDODERM Place 1 patch onto the skin daily. Remove &  Discard patch within 12 hours or as directed by MD   LORazepam 0.5 MG tablet Commonly known as:  ATIVAN Take one tablet by mouth every night at bedtime for anxiety   meclizine 12.5 MG tablet Commonly known as:  ANTIVERT Take 12.5 mg by mouth 2 (two) times daily.   methocarbamol 500 MG tablet Commonly known as:  ROBAXIN Take 500 mg by mouth at bedtime. For muscle spasms/shoulder pain.   metoprolol 50 MG tablet Commonly known as:  LOPRESSOR Take 50 mg by mouth 2 (two) times daily.   montelukast 10 MG tablet Commonly known as:  SINGULAIR Take 10 mg by mouth at bedtime.   nitroGLYCERIN 0.4 MG SL tablet Commonly known as:  NITROSTAT Place 0.4 mg under the tongue every 5 (five) minutes as needed for chest pain.   ondansetron 4 MG tablet Commonly known as:  ZOFRAN Take 4 mg by mouth every 6 (six) hours as needed for nausea or vomiting.   OXYGEN Inhale 2 L into the lungs as needed (To maintain saturations >90%).   PATADAY 0.2 % Soln Generic drug:  Olopatadine HCl Place 1 drop into both eyes daily.   potassium chloride SA 20 MEQ tablet Commonly known as:  K-DUR,KLOR-CON Take 1 tablet (20 mEq total) by mouth daily.   predniSONE 10 MG tablet Commonly known as:  DELTASONE Take 10 mg by mouth daily. For RA   promethazine 25 MG tablet Commonly known as:  PHENERGAN Take 25 mg by mouth every 12 (twelve) hours as needed for nausea or vomiting.   rosuvastatin 20 MG tablet Commonly known as:  CRESTOR Take 20 mg by mouth every evening. For hyperlipidemia   sertraline 50 MG tablet Commonly known as:  ZOLOFT Take 50 mg by mouth every morning. For depression   Vitamin D 2000 units Caps Take 1 capsule (2,000 Units total) by mouth daily.       Immunizations: Immunization History  Administered Date(s) Administered  . Influenza-Unspecified 05/09/2013, 04/27/2014, 04/17/2015, 09/17/2015, 07/23/2016  . PPD Test 12/22/2012, 03/14/2014, 03/08/2015  . Pneumococcal-Unspecified  05/09/2013, 09/09/2013     Physical Exam:  Vitals:   09/22/16 1050  BP: (!) 141/74  Pulse: 75  Resp: 18  Temp: 97.4 F (36.3 C)  TempSrc: Oral  SpO2: 98%  Weight: 135 lb 3.2 oz (61.3 kg)  Height: 5\' 6"  (1.676  m)   Body mass index is 21.82 kg/m.  General- elderly female, Frail, chronically ill-appearing, in no acute distress Head- normocephalic, atraumatic Nose- no nasal discharge Throat- moist mucus membrane Eyes- PERRLA, EOMI, no pallor, no icterus, no discharge, normal conjunctiva, normal sclera Neck- no cervical lymphadenopathy Cardiovascular- normal s1,s2, no murmur Respiratory- bilateral clear to auscultation, no wheeze, no rhonchi, no crackles, no use of accessory muscles Abdomen- bowel sounds present, soft, non tender, no guarding or rigidity, no CVA tenderness Musculoskeletal- able to move all 4 extremities, generalized weakness, severe arthritis changes to both her hands, good range of motion to both her wrist, no redness or swelling noted to her wrist. She gets around the facility on wheelchair.  Neurological- alert and oriented to person, place and time Skin- warm and dry, easy bruising Psychiatry- normal mood and affect    Labs reviewed: Basic Metabolic Panel:  Recent Labs  09/16/16 0615 09/17/16 1043 09/18/16 0827  NA 141 142 142  K 4.0 3.1* 3.1*  CL 105 109 108  CO2 25 24 26   GLUCOSE 143* 95 86  BUN 23* 21* 16  CREATININE 1.68* 1.58* 1.53*  CALCIUM 8.3* 8.0* 7.9*   Liver Function Tests:  Recent Labs  09/04/16 2139 09/15/16 1346 09/16/16 0615  AST 36 32 25  ALT 19 17 14   ALKPHOS 49 43 44  BILITOT 0.8 0.6 0.6  PROT 5.8* 5.2* 4.7*  ALBUMIN 3.2* 2.7* 2.2*   No results for input(s): LIPASE, AMYLASE in the last 8760 hours. No results for input(s): AMMONIA in the last 8760 hours. CBC:  Recent Labs  09/04/16 2139  09/15/16 1346 09/16/16 0615 09/17/16 1043  WBC 10.9*  --  15.2* 10.1 9.0  NEUTROABS 9.1*  --  12.7* 8.8*  --   HGB  11.4*  < > 10.4* 9.4* 9.3*  HCT 36.3  < > 33.4* 30.0* 29.4*  MCV 87.3  --  89.1 88.5 87.8  PLT 153  --  133* 127* 122*  < > = values in this interval not displayed. Cardiac Enzymes:  Recent Labs  09/16/16 0615  TROPONINI 0.09*   BNP: Invalid input(s): POCBNP CBG:  Recent Labs  09/04/16 1935 09/16/16 0816 09/16/16 1246  GLUCAP 240* 122* 105*    Radiological Exams: Dg Chest 2 View  Result Date: 09/15/2016 CLINICAL DATA:  Fever. EXAM: CHEST  2 VIEW COMPARISON:  Radiograph of July 23, 2015. FINDINGS: Stable cardiomediastinal silhouette. No pneumothorax or pleural effusion is noted. Status post surgical posterior fusion of thoracic spine. No acute pulmonary disease is noted. Stable hiatal hernia. IMPRESSION: No active cardiopulmonary disease.  Stable hiatal hernia. Electronically Signed   By: Marijo Conception, M.D.   On: 09/15/2016 14:59   Dg Wrist 2 Views Left  Result Date: 09/15/2016 CLINICAL DATA:  Status post fall, with left wrist erythema and swelling. Initial encounter. EXAM: LEFT WRIST - 2 VIEW COMPARISON:  None. FINDINGS: There is no evidence of fracture or dislocation. The carpal rows demonstrate normal alignment. A few osseous erosions are noted at the carpal rows. The joint spaces are preserved. Soft tissue swelling is noted about the wrist. IMPRESSION: 1. No evidence of fracture or dislocation. 2. Few osseous erosions at the carpal rows, likely reflecting erosive arthritis. Electronically Signed   By: Garald Balding M.D.   On: 09/15/2016 22:52   Ct Head Wo Contrast  Result Date: 09/04/2016 CLINICAL DATA:  Left-sided weakness for 1 day EXAM: CT HEAD WITHOUT CONTRAST TECHNIQUE: Contiguous axial images were obtained from  the base of the skull through the vertex without intravenous contrast. COMPARISON:  MRI 05/10/2014, CT scan brain 12/01/2011 FINDINGS: Brain: No acute territorial infarction or intracranial hemorrhage is visualized. There is no focal mass, mass effect or mid  line shift. Extensive periventricular, subcortical and deep white matter small vessel ischemic changes. Old small infarcts involving the right frontal lobe, right posterior temporal lobe, and left occipital lobe. Old the laminae Eck and left basal gangliar lacunar infarcts. Moderate atrophy. Ventricles are stable in size. Vascular: No hyperdense vessels. Carotid artery calcifications. Vertebral artery calcifications. Skull: No fracture.  No suspicious bone lesion. Sinuses/Orbits: Minimal mucosal thickening in the ethmoid sinuses. No acute orbital abnormality. Other: None IMPRESSION: 1. No definite CT evidence for acute intracranial abnormality. 2. Multifocal old infarcts as described above. 3. Extensive small vessel ischemic changes of the bilateral white matter. Electronically Signed   By: Donavan Foil M.D.   On: 09/04/2016 20:11   Ct Cervical Spine Wo Contrast  Result Date: 09/15/2016 CLINICAL DATA:  Status post fall, with confusion. Concern for cervical spine injury. Initial encounter. EXAM: CT CERVICAL SPINE WITHOUT CONTRAST TECHNIQUE: Multidetector CT imaging of the cervical spine was performed without intravenous contrast. Multiplanar CT image reconstructions were also generated. COMPARISON:  None. FINDINGS: Alignment: There is grade 1 anterolisthesis of C3 on C4, and grade 2 anterolisthesis of C4 on C5. There is grade 1 retrolisthesis of C5 on C6. There is grade 1 anterolisthesis of C7 on T1 and of T1 on T2. Underlying facet disease is noted at the mid cervical spine. Skull base and vertebrae: No acute fracture. No primary bone lesion or focal pathologic process. Soft tissues and spinal canal: No prevertebral fluid or swelling. No visible canal hematoma. Disc levels: Multilevel disc space narrowing is noted along the lower cervical spine. Upper chest: Dense calcification is seen at the carotid bifurcations bilaterally. The thyroid gland is unremarkable in appearance. The visualized lung apices are  grossly clear. Other: The visualized portions of the brain are grossly unremarkable. IMPRESSION: 1. No evidence of acute fracture or subluxation along the cervical spine. 2. Multilevel degenerative change along the cervical spine, as described above. 3. Dense calcification at the carotid bifurcations bilaterally. Carotid ultrasound would be helpful for further evaluation, when and as deemed clinically appropriate. Electronically Signed   By: Garald Balding M.D.   On: 09/15/2016 23:35   Dg Pelvis Portable  Result Date: 09/15/2016 CLINICAL DATA:  Status post fall, with pelvic pain. Initial encounter. EXAM: PORTABLE PELVIS 1-2 VIEWS COMPARISON:  CT of the abdomen pelvis performed 12/18/2012 FINDINGS: There is no evidence of fracture or dislocation. Lumbar spinal fusion hardware is noted. Both femoral heads are seated normally within their respective acetabula. No significant degenerative change is appreciated. The sacroiliac joints are unremarkable in appearance. The visualized bowel gas pattern is grossly unremarkable in appearance. Scattered phleboliths are noted within the pelvis. Scattered vascular calcifications are seen. IMPRESSION: 1. No evidence of fracture or dislocation. 2. Scattered vascular calcifications seen. Electronically Signed   By: Garald Balding M.D.   On: 09/15/2016 22:51    Assessment/Plan  Generalized weakness From deconditioning with recent hospitalization. Will have her work with physical therapy and occupational therapy team to help with gait training and muscle strengthening exercises.fall precautions. Skin care. Encourage to be out of bed.   Acute cystitis Denies any symptoms this visit. Continue Augmentin 875 mg every 12 hours for 5 days until 09/23/16. Hydration encouraged. Perineal hygiene to be maintained. Continue cranberry supplement. Will start  patient back on nitrofurantoin 100 mg daily for UTI prophylaxis after completion of antibiotic.  Left wrist cellulitis Continue  and complete her augmentin, monitor clinically, this has resolved.   Protein calorie malnutrition RD consult, monitor po intake and weight.   Anemia of chronic disease Monitor cbc. Currently on iron supplement tid, change this to qd.   Thrombocytopenia Monitor platelet count, easy bruising present, monitor for bleed  Chronic kidney disease stage III Monitor BMP  Chronic depression and anxiety On lorazepam 0.5 mg qhs with sertraline 50 mg daily and has remained stable, monitor, psych service to follow  Chronic DVT Continue eliquis 5 mg bid  Hypothyroidism Lab Results  Component Value Date   TSH 0.251 (L) 09/16/2016   Low TSH. Was on 50 mcg levothyroxine before, this has been decreased to 25 mcg daily now. Check tsh, free T3 in 4 weeks  Hypokalemia Her hctz could e contributing some. Continue kcl supplement for now and check BMP  Hypertension Monitor BP reading, continue hydrochlorthiazide with metoprolol and monitor  Allergic rhinitis stable, change zyrtec but change it to daily as needed and monitor  PUD No bleed reported, continue nexium 40 mg bid for now  Right shoulder arthritis Continue lidocaine patch. Will attempt to avoid medication that can alter her mentation. With pain controlled, decrease norco to 5-325 mg once a day x 1 week and then daily as needed and monitor. Continue robaxin but change this to daily as needed as well    Goals of care: short term rehabilitation   Labs/tests ordered: cbc, bmp 08/29/16, tsh and free t3, T4 in 4 weeks  Family/ staff Communication: reviewed care plan with patient, her son over the phone and nursing supervisor  I have spent greater than 50 minutes for this encounter which includes reviewing hospital records, addressing above mentioned concerns, reviewing care plan with patient and family, answering patient's concerns and son's concerns and counseling.     [name], MD Internal Medicine [facility] [address] [place], [place] [address] Cell Phone (Monday-Friday 8 am - 5 pm): [phone] On Call: [phone] and follow prompts after 5 pm and on weekends Office Phone: [phone] Office Fax: [phone]

## 2016-09-29 LAB — HEPATIC FUNCTION PANEL
ALK PHOS: 51 U/L (ref 25–125)
ALT: 13 U/L (ref 7–35)
AST: 16 U/L (ref 13–35)
BILIRUBIN, TOTAL: 0.6 mg/dL

## 2016-09-29 LAB — BASIC METABOLIC PANEL
BUN: 17 mg/dL (ref 4–21)
Creatinine: 1.2 mg/dL — AB (ref 0.5–1.1)
Glucose: 77 mg/dL
Potassium: 3.8 mmol/L (ref 3.4–5.3)
SODIUM: 146 mmol/L (ref 137–147)

## 2016-10-21 ENCOUNTER — Encounter: Payer: Self-pay | Admitting: Family

## 2016-10-21 ENCOUNTER — Non-Acute Institutional Stay (SKILLED_NURSING_FACILITY): Payer: Medicare Other | Admitting: Family

## 2016-10-21 DIAGNOSIS — I130 Hypertensive heart and chronic kidney disease with heart failure and stage 1 through stage 4 chronic kidney disease, or unspecified chronic kidney disease: Secondary | ICD-10-CM

## 2016-10-21 DIAGNOSIS — F329 Major depressive disorder, single episode, unspecified: Secondary | ICD-10-CM | POA: Diagnosis not present

## 2016-10-21 DIAGNOSIS — E039 Hypothyroidism, unspecified: Secondary | ICD-10-CM | POA: Diagnosis not present

## 2016-10-21 DIAGNOSIS — F32A Depression, unspecified: Secondary | ICD-10-CM

## 2016-10-21 DIAGNOSIS — E782 Mixed hyperlipidemia: Secondary | ICD-10-CM

## 2016-10-21 DIAGNOSIS — I5030 Unspecified diastolic (congestive) heart failure: Secondary | ICD-10-CM | POA: Diagnosis not present

## 2016-10-21 DIAGNOSIS — N183 Chronic kidney disease, stage 3 unspecified: Secondary | ICD-10-CM

## 2016-10-21 NOTE — Progress Notes (Signed)
Location:  [facility] Room Number: 671 A Place of Service:  SNF (31) Provider:  Ebonye Reade FNP-C   [name], MD  Patient Care Team: [name], MD as PCP - General (Internal Medicine) [name], NP as Nurse Practitioner (Nurse Practitioner)  Extended Emergency Contact Information Primary Emergency Contact: [name],[name] Address: [address], [place] Phone: [phone] Mobile Phone: [phone] Relation: Son Secondary Emergency Contact: [name] Phone: [phone] Mobile Phone: [phone] Relation: Daughter  Code Status:  Full Code  Goals of care: Advanced Directive information Advanced Directives 10/21/2016  Does Patient Have a Medical Advance Directive? No  Type of Advance Directive -  Does patient want to make changes to medical advance directive? -  Copy of [place] in Chart? -  Would patient like information on creating a medical advance directive? No - Patient declined  Pre-existing out of facility DNR order (yellow form or pink MOST form) -     Chief Complaint  Patient presents with  . Medical Management of Chronic Issues    Routine Visit    HPI:  Pt is a 83 y.o. female seen today at [facility] for medical management of chronic diseases.She has a medical history of HTN, CHF,CKD stage 3, CVA, Hyperlipidemia, CAD, RA  among other conditions.She is seen in her room today. She denies any acute issues this visit.she has had no weight changes or fall episodes.Patient's daughter states patient not eating meals and refuses to drink protein supplement provided by the facility.Patient's son brought in chocolate Ensure though patient states prefers strawberry.Facility Nurse reports no new concerns.   Past Medical History:  Diagnosis Date  . Anxiety   . Arthritis   . Chronic diastolic CHF (congestive  heart failure) ([place])    a. 10/2014 Echo: EF 60-65%, mild LVH, grade 1 DD, dynamic obstruction @ rest - peak velocity of 271 cm/sec, peak grad of 35mmHg, PASP 52mmHg.  Marland Kitchen Coronary artery disease    a. 2012 s/p NSTEMI/Cath: 95% apical LAD dzs, otw nonobs dzs-->Med Rx;  b. 05/2011 MV: no ischemia, EF 79%, inf infarct- attenuation.  . CVA (cerebral vascular accident) ([place])   . Depression   . DVT of lower extremity (deep venous thrombosis) ([place])   . Dyspnea   . Elevated troponin    a. 10/2014 in setting of sepsis/?HCAP.  Marland Kitchen Enteritis due to Clostridium difficile 01/04/2013  . Essential hypertension   . Fall   . Fatigue   . GERD (gastroesophageal reflux disease)   . GI bleed 2005  . HCAP (healthcare-associated pneumonia) 01/22/2013  . Hemorrhoids   . Hiatal hernia   . Hyperlipidemia   . Hypothyroidism   . Malnutrition ([place])   . Obstructive hypertrophic cardiomyopathy ([place])    a. 10/2014 Echo: EF 60-65%, mild LVH, grade 1 DD, dynamic obstruction @ rest - peak velocity of 271 cm/sec, peak grad of 56mmHg, PASP 75mmHg.  . Osteoporosis   . PUD (peptic ulcer disease)   . RA (rheumatoid arthritis) ([place])   . Recurrent colitis due to Clostridium difficile 06/02/2012  . Renal insufficiency   . Right ear pain   . Sepsis ([place]) 12/19/2012  . TIA (transient ischemic attack)   . Weight loss    Past Surgical History:  Procedure Laterality Date  . ANKLE SURGERY    . BACK SURGERY    .  [place]   x3  . CARDIAC CATHETERIZATION     SHOWED RUPTURE PLAQUE IN THE LAD. THE LAD IS NONOBSTRUCTIVE WITH ONLY 30-40% STENOSIS  . COLONOSCOPY    . KNEE SURGERY    . NSTEMI  06/2010  . SPINAL FUSION SURGERY      Allergies  Allergen Reactions  . Codeine Swelling  . Sulfa Antibiotics Other (See Comments)    GI upset  . Biphosphate Other (See Comments)  . Morphine And Related Other (See Comments)  . Percocet [Oxycodone-Acetaminophen] Other (See Comments)  . Plavix [Clopidogrel Bisulfate] Itching and  Rash    Allergies as of 10/21/2016      Reactions   Codeine Swelling   Sulfa Antibiotics Other (See Comments)   GI upset   Biphosphate Other (See Comments)   Morphine And Related Other (See Comments)   Percocet [oxycodone-acetaminophen] Other (See Comments)   Plavix [clopidogrel Bisulfate] Itching, Rash      Medication List       Accurate as of 10/21/16  8:50 PM. Always use your most recent med list.          ACT DRY MOUTH Lozg Use as directed in the mouth or throat as needed (for dry mouth).   aspirin EC 81 MG tablet Take 81 mg by mouth at bedtime.   BIOFREEZE 4 % Gel Generic drug:  Menthol (Topical Analgesic) Apply 1 application topically every 8 (eight) hours. For wrist pain   BIOFREEZE 4 % Gel Generic drug:  Menthol (Topical Analgesic) Apply topically 3 (three) times daily.   calcium-vitamin D 500-200 MG-UNIT tablet Commonly known as:  OSCAL WITH D Take 1 tablet by mouth 3 (three) times daily.   carboxymethylcellulose 1 % ophthalmic solution Apply 1 drop to eye 3 (three) times daily. Both eyes   CERTAVITE/ANTIOXIDANTS PO Take 1 tablet by mouth daily.   cetirizine 10 MG tablet Commonly known as:  ZYRTEC Take 10 mg by mouth daily.   Cranberry 200 MG Caps Take 2 capsules by mouth 2 (two) times daily.   denosumab 60 MG/ML Soln injection Commonly known as:  PROLIA Inject 60 mg into the skin every 6 (six) months. Administer in upper arm, thigh, or abdomen   ELIQUIS 5 MG Tabs tablet Generic drug:  apixaban Take 5 mg by mouth 2 (two) times daily. For DVT   esomeprazole 40 MG capsule Commonly known as:  NEXIUM Take 40 mg by mouth 2 (two) times daily before a meal.   ferrous sulfate 325 (65 FE) MG tablet Take 325 mg by mouth daily.   hydrochlorothiazide 12.5 MG tablet Commonly known as:  HYDRODIURIL Take 1 tablet (12.5 mg total) by mouth daily.   HYDROcodone-acetaminophen 5-325 MG tablet Commonly known as:  NORCO/VICODIN Take 1 tablet by mouth  daily.   ipratropium 0.03 % nasal spray Commonly known as:  ATROVENT Place 2 sprays into both nostrils daily as needed (For asthma).   ipratropium-albuterol 0.5-2.5 (3) MG/3ML Soln Commonly known as:  DUONEB Take 3 mLs by nebulization every 8 (eight) hours as needed (wheezing).   levothyroxine 25 MCG tablet Commonly known as:  SYNTHROID, LEVOTHROID Take 1 tablet (25 mcg total) by mouth daily before breakfast.   lidocaine 5 % Commonly known as:  LIDODERM Place 1 patch onto the skin daily. Remove & Discard patch within 12 hours or as directed by MD   LORazepam 0.5 MG tablet Commonly known as:  ATIVAN Take one tablet by mouth every night at bedtime for anxiety  meclizine 12.5 MG tablet Commonly known as:  ANTIVERT Take 12.5 mg by mouth 2 (two) times daily.   metoprolol 50 MG tablet Commonly known as:  LOPRESSOR Take 50 mg by mouth 2 (two) times daily.   montelukast 10 MG tablet Commonly known as:  SINGULAIR Take 10 mg by mouth at bedtime.   nitrofurantoin (macrocrystal-monohydrate) 100 MG capsule Commonly known as:  MACROBID Take 100 mg by mouth daily.   nitroGLYCERIN 0.4 MG SL tablet Commonly known as:  NITROSTAT Place 0.4 mg under the tongue every 5 (five) minutes as needed for chest pain.   ondansetron 4 MG tablet Commonly known as:  ZOFRAN Take 4 mg by mouth every 6 (six) hours as needed for nausea or vomiting.   OXYGEN Inhale 2 L into the lungs as needed (To maintain saturations >90%).   PATADAY 0.2 % Soln Generic drug:  Olopatadine HCl Place 1 drop into both eyes daily.   potassium chloride SA 20 MEQ tablet Commonly known as:  K-DUR,KLOR-CON Take 1 tablet (20 mEq total) by mouth daily.   predniSONE 10 MG tablet Commonly known as:  DELTASONE Take 10 mg by mouth daily. For RA   rosuvastatin 20 MG tablet Commonly known as:  CRESTOR Take 20 mg by mouth every evening. For hyperlipidemia   sertraline 50 MG tablet Commonly known as:  ZOLOFT Take 50 mg  by mouth every morning. For depression   Vitamin D 2000 units Caps Take 1 capsule (2,000 Units total) by mouth daily.       Review of Systems  Constitutional: Negative for activity change, appetite change, chills, fatigue and fever.  HENT: Negative for congestion, rhinorrhea, sinus pressure, sneezing and sore throat.   Eyes: Negative.   Respiratory: Negative for cough, chest tightness, shortness of breath and wheezing.   Cardiovascular: Negative for chest pain, palpitations and leg swelling.  Gastrointestinal: Negative for abdominal distention, abdominal pain, constipation, diarrhea, nausea and vomiting.  Endocrine: Negative for cold intolerance, heat intolerance, polydipsia, polyphagia and polyuria.  Genitourinary: Negative for difficulty urinating, dysuria, flank pain, frequency, urgency, vaginal discharge and vaginal pain.  Musculoskeletal: Positive for gait problem.  Skin: Negative for color change, pallor and rash.  Neurological: Negative for dizziness, seizures, light-headedness and headaches.  Hematological: Does not bruise/bleed easily.  Psychiatric/Behavioral: Negative for agitation, confusion, hallucinations and sleep disturbance. The patient is not nervous/anxious.     Immunization History  Administered Date(s) Administered  . Influenza-Unspecified 05/09/2013, 04/27/2014, 04/17/2015, 09/17/2015, 07/23/2016  . PPD Test 12/22/2012, 03/14/2014, 03/08/2015  . Pneumococcal-Unspecified 05/09/2013, 09/09/2013   Pertinent  Health Maintenance Due  Topic Date Due  . INFLUENZA VACCINE  01/28/2017  . DEXA SCAN  Completed  . PNA vac Low Risk Adult  Completed   Fall Risk  10/04/2014  Falls in the past year? Yes  Number falls in past yr: 2 or more  Injury with Fall? No  Risk Factor Category  High Fall Risk  Risk for fall due to : Impaired balance/gait;Impaired mobility;Medication side effect;History of fall(s)  Follow up Education provided;Falls evaluation completed;Falls  prevention discussed    Vitals:   10/21/16 1201  BP: 122/77  Pulse: 64  Resp: 17  Temp: 97.5 F (36.4 C)  TempSrc: Oral  SpO2: 96%  Weight: 135 lb 11.2 oz (61.6 kg)  Height: 5\' 6"  (1.676 m)   Body mass index is 21.9 kg/m. Physical Exam  Constitutional: She is oriented to person, place, and time. She appears well-developed and well-nourished. No distress.  HENT:  Head:  Normocephalic and atraumatic.  Mouth/Throat: Oropharynx is clear and moist.  Eyes: Conjunctivae and EOM are normal. Pupils are equal, round, and reactive to light. Right eye exhibits no discharge. Left eye exhibits no discharge. No scleral icterus.  Neck: Normal range of motion. No JVD present. No thyromegaly present.  Cardiovascular: Normal rate, regular rhythm, normal heart sounds and intact distal pulses.  Exam reveals no gallop and no friction rub.   No murmur heard. Pulmonary/Chest: Effort normal and breath sounds normal. No respiratory distress. She has no wheezes. She has no rales.     Abdominal: Soft. Bowel sounds are normal. She exhibits no distension. There is no tenderness. There is no rebound and no guarding.  Genitourinary: No vaginal discharge found.  Genitourinary Comments: Continent.   Musculoskeletal: She exhibits no edema, tenderness or deformity.  Moves x 4 extremities.Unsteady gait.   Lymphadenopathy:    She has no cervical adenopathy.  Neurological: She is oriented to person, place, and time.  Skin: Skin is warm and dry. No rash noted. No erythema.  Psychiatric: She has a normal mood and affect.    Labs reviewed:  Recent Labs  09/16/16 0615 09/17/16 1043 09/18/16 0827 09/29/16  NA 141 142 142 146  K 4.0 3.1* 3.1* 3.8  CL 105 109 108  --   CO2 25 24 26   --   GLUCOSE 143* 95 86  --   BUN 23* 21* 16 17  CREATININE 1.68* 1.58* 1.53* 1.2*  CALCIUM 8.3* 8.0* 7.9*  --     Recent Labs  09/04/16 2139 09/15/16 1346 09/16/16 0615 09/29/16  AST 36 32 25 16  ALT 19 17 14 13    ALKPHOS 49 43 44 51  BILITOT 0.8 0.6 0.6  --   PROT 5.8* 5.2* 4.7*  --   ALBUMIN 3.2* 2.7* 2.2*  --     Recent Labs  09/04/16 2139  09/15/16 1346 09/16/16 0615 09/17/16 1043  WBC 10.9*  --  15.2* 10.1 9.0  NEUTROABS 9.1*  --  12.7* 8.8*  --   HGB 11.4*  < > 10.4* 9.4* 9.3*  HCT 36.3  < > 33.4* 30.0* 29.4*  MCV 87.3  --  89.1 88.5 87.8  PLT 153  --  133* 127* 122*  < > = values in this interval not displayed. Lab Results  Component Value Date   TSH 0.251 (L) 09/16/2016   Lab Results  Component Value Date   HGBA1C 6.4 01/11/2016   Lab Results  Component Value Date   CHOL 109 01/11/2016   HDL 37 01/11/2016   LDLCALC 39 01/11/2016   LDLDIRECT 60.7 11/13/2010   TRIG 167 (A) 01/11/2016   CHOLHDL 2.9 12/02/2011    Assessment/Plan Hypothyroidism  Awaiting a repeat TSH level results.Continue on Levothyroxine.  HTN  B/p stable.Continue on HCZT 12.5 mg tablet and Metoprolol 50 mg tablet twice daily.On ASA EC for prophylaxis. Monitor BMP  Depression  Stable.Continue sertraline.Continue to monitor for mood changes.  Hyperlipidemia    continue on Crestor 20 mg Tablet daily.Continue to monitor lipid panel periodically.    Family/ staff Communication: reviewed plan of care with patient and facility Nurse supervisor.   Labs/tests ordered: None

## 2016-10-30 DIAGNOSIS — M79674 Pain in right toe(s): Secondary | ICD-10-CM | POA: Diagnosis not present

## 2016-10-30 DIAGNOSIS — I70203 Unspecified atherosclerosis of native arteries of extremities, bilateral legs: Secondary | ICD-10-CM | POA: Diagnosis not present

## 2016-10-30 DIAGNOSIS — B351 Tinea unguium: Secondary | ICD-10-CM | POA: Diagnosis not present

## 2016-10-30 DIAGNOSIS — I739 Peripheral vascular disease, unspecified: Secondary | ICD-10-CM | POA: Diagnosis not present

## 2016-11-04 ENCOUNTER — Non-Acute Institutional Stay (SKILLED_NURSING_FACILITY): Payer: Medicare Other | Admitting: Family

## 2016-11-04 DIAGNOSIS — R410 Disorientation, unspecified: Secondary | ICD-10-CM

## 2016-11-04 DIAGNOSIS — R2232 Localized swelling, mass and lump, left upper limb: Secondary | ICD-10-CM

## 2016-11-04 DIAGNOSIS — M25532 Pain in left wrist: Secondary | ICD-10-CM | POA: Diagnosis not present

## 2016-11-04 DIAGNOSIS — M79642 Pain in left hand: Secondary | ICD-10-CM

## 2016-11-04 DIAGNOSIS — R600 Localized edema: Secondary | ICD-10-CM | POA: Diagnosis not present

## 2016-11-04 DIAGNOSIS — R0902 Hypoxemia: Secondary | ICD-10-CM

## 2016-11-04 MED ORDER — SACCHAROMYCES BOULARDII 250 MG PO CAPS: 250.0000 mg | ORAL_CAPSULE | Freq: Two times a day (BID) | ORAL | 0 refills | Status: DC

## 2016-11-04 MED ORDER — DOXYCYCLINE HYCLATE 100 MG PO TABS: 100.0000 mg | ORAL_TABLET | Freq: Two times a day (BID) | ORAL | 0 refills | Status: AC

## 2016-11-04 NOTE — Progress Notes (Signed)
Location:  [place] Room Number: 099 A  Place of Service:  SNF (31) Provider:  Lidiya Reise FNP-C   [name], MD  Patient Care Team: [name], MD as PCP - General (Internal Medicine) [name], NP as Nurse Practitioner (Nurse Practitioner)  Extended Emergency Contact Information Primary Emergency Contact: Marty,[name] Address: [address], [place] Phone: [phone] Mobile Phone: [phone] Relation: Son Secondary Emergency Contact: [name] Phone: [phone] Mobile Phone: [phone] Relation: Daughter  Code Status:  Full Code  Goals of care: Advanced Directive information Advanced Directives 10/21/2016  Does Patient Have a Medical Advance Directive? No  Type of Advance Directive -  Does patient want to make changes to medical advance directive? -  Copy of Villan[place] in Chart? -  Would patient like information on creating a medical advance directive? No - Patient declined  Pre-existing out of facility DNR order (yellow form or pink MOST form) -     Chief Complaint  Patient presents with  . Acute Visit    left hand swelling, pain     HPI:  Pt is a 83 y.o. female seen today at [facility] for acute visit for evaluation of left wrist swelling and pain.She has a significant medical history of RA  among other conditions.She is seen in her room today with her daughter at bedside. Patient's daughter noticed left hand swelling and patient not using hand as usual.she wonders whether previous cellulitis has recurred. Also reports patient's oxygen dropped to 70's yesterday. Patient noted with oxygen tubing over her head during visit.She was last treated in march 2018 with left hand cellulitis.Also states patient seems to be more confused today.patient complains of left hand pain. She denies any fever, chills or  injury to hand. Facility Nurse reports no new concerns.    Past Medical History:  Diagnosis Date  . Anxiety   . Arthritis   . Chronic diastolic CHF (congestive heart failure) ([place])    a. 10/2014 Echo: EF 60-65%, mild LVH, grade 1 DD, dynamic obstruction @ rest - peak velocity of 271 cm/sec, peak grad of 42mmHg, PASP 38mmHg.  Marland Kitchen Coronary artery disease    a. 2012 s/p NSTEMI/Cath: 95% apical LAD dzs, otw nonobs dzs-->Med Rx;  b. 05/2011 MV: no ischemia, EF 79%, inf infarct- attenuation.  . CVA (cerebral vascular accident) ([place])   . Depression   . DVT of lower extremity (deep venous thrombosis) ([place])   . Dyspnea   . Elevated troponin    a. 10/2014 in setting of sepsis/?HCAP.  Marland Kitchen Enteritis due to Clostridium difficile 01/04/2013  . Essential hypertension   . Fall   . Fatigue   . GERD (gastroesophageal reflux disease)   . GI bleed 2005  . HCAP (healthcare-associated pneumonia) 01/22/2013  . Hemorrhoids   . Hiatal hernia   . Hyperlipidemia   . Hypothyroidism   . Malnutrition ([place])   . Obstructive hypertrophic cardiomyopathy ([place])    a. 10/2014 Echo: EF 60-65%, mild LVH, grade 1 DD, dynamic obstruction @ rest - peak velocity of 271 cm/sec, peak grad of 5mmHg, PASP 28mmHg.  . Osteoporosis   . PUD (peptic ulcer disease)   . RA (rheumatoid arthritis) ([place])   . Recurrent colitis due to Clostridium difficile 06/02/2012  . Renal insufficiency   . Right ear pain   . Sepsis (  Home) 12/19/2012  . TIA (transient ischemic attack)   . Weight loss    Past Surgical History:  Procedure Laterality Date  . ANKLE SURGERY    . BACK SURGERY    . Cale   x3  . CARDIAC CATHETERIZATION     SHOWED RUPTURE PLAQUE IN THE LAD. THE LAD IS NONOBSTRUCTIVE WITH ONLY 30-40% STENOSIS  . COLONOSCOPY    . KNEE SURGERY    . NSTEMI  06/2010  . SPINAL FUSION SURGERY      Allergies  Allergen Reactions  . Codeine Swelling  . Sulfa Antibiotics Other (See Comments)    GI upset  . Biphosphate Other (See  Comments)  . Morphine And Related Other (See Comments)  . Percocet [Oxycodone-Acetaminophen] Other (See Comments)  . Plavix [Clopidogrel Bisulfate] Itching and Rash    Allergies as of 11/04/2016      Reactions   Codeine Swelling   Sulfa Antibiotics Other (See Comments)   GI upset   Biphosphate Other (See Comments)   Morphine And Related Other (See Comments)   Percocet [oxycodone-acetaminophen] Other (See Comments)   Plavix [clopidogrel Bisulfate] Itching, Rash      Medication List       Accurate as of 11/04/16  1:29 PM. Always use your most recent med list.          ACT DRY MOUTH Lozg Use as directed in the mouth or throat as needed (for dry mouth).   BIOFREEZE 4 % Gel Generic drug:  Menthol (Topical Analgesic) Apply 1 application topically 2 (two) times daily. For wrist pain   calcium-vitamin D 500-200 MG-UNIT tablet Commonly known as:  OSCAL WITH D Take 1 tablet by mouth 3 (three) times daily.   carboxymethylcellulose 1 % ophthalmic solution Apply 1 drop to eye 3 (three) times daily. Both eyes   CERTAVITE/ANTIOXIDANTS PO Take 1 tablet by mouth daily.   cetirizine 10 MG tablet Commonly known as:  ZYRTEC Take 10 mg by mouth daily.   Cranberry 200 MG Caps Take 2 capsules by mouth 2 (two) times daily.   denosumab 60 MG/ML Soln injection Commonly known as:  PROLIA Inject 60 mg into the skin every 6 (six) months. Administer in upper arm, thigh, or abdomen   ELIQUIS 5 MG Tabs tablet Generic drug:  apixaban Take 5 mg by mouth 2 (two) times daily. For DVT   esomeprazole 40 MG capsule Commonly known as:  NEXIUM Take 40 mg by mouth 2 (two) times daily before a meal.   ferrous sulfate 325 (65 FE) MG tablet Take 325 mg by mouth daily.   hydrochlorothiazide 12.5 MG tablet Commonly known as:  HYDRODIURIL Take 1 tablet (12.5 mg total) by mouth daily.   HYDROcodone-acetaminophen 5-325 MG tablet Commonly known as:  NORCO/VICODIN Take 1 tablet by mouth daily.     ipratropium 0.03 % nasal spray Commonly known as:  ATROVENT Place 2 sprays into both nostrils daily as needed (For asthma).   ipratropium-albuterol 0.5-2.5 (3) MG/3ML Soln Commonly known as:  DUONEB Take 3 mLs by nebulization every 8 (eight) hours as needed (wheezing).   levothyroxine 25 MCG tablet Commonly known as:  SYNTHROID, LEVOTHROID Take 1 tablet (25 mcg total) by mouth daily before breakfast.   lidocaine 5 % Commonly known as:  LIDODERM Place 1 patch onto the skin daily. Remove & Discard patch within 12 hours or as directed by MD   LORazepam 0.5 MG tablet Commonly known as:  ATIVAN Take one tablet by mouth  every night at bedtime for anxiety   meclizine 12.5 MG tablet Commonly known as:  ANTIVERT Take 12.5 mg by mouth 2 (two) times daily.   metoprolol 50 MG tablet Commonly known as:  LOPRESSOR Take 50 mg by mouth 2 (two) times daily.   montelukast 10 MG tablet Commonly known as:  SINGULAIR Take 10 mg by mouth at bedtime.   nitrofurantoin (macrocrystal-monohydrate) 100 MG capsule Commonly known as:  MACROBID Take 100 mg by mouth daily.   nitroGLYCERIN 0.4 MG SL tablet Commonly known as:  NITROSTAT Place 0.4 mg under the tongue every 5 (five) minutes as needed for chest pain.   ondansetron 4 MG tablet Commonly known as:  ZOFRAN Take 4 mg by mouth every 6 (six) hours as needed for nausea or vomiting.   OXYGEN Inhale 2 L into the lungs as needed (To maintain saturations >90%).   PATADAY 0.2 % Soln Generic drug:  Olopatadine HCl Place 1 drop into both eyes daily.   potassium chloride SA 20 MEQ tablet Commonly known as:  K-DUR,KLOR-CON Take 1 tablet (20 mEq total) by mouth daily.   predniSONE 10 MG tablet Commonly known as:  DELTASONE Take 10 mg by mouth daily. For RA   rosuvastatin 20 MG tablet Commonly known as:  CRESTOR Take 20 mg by mouth every evening. For hyperlipidemia   sertraline 50 MG tablet Commonly known as:  ZOLOFT Take 50 mg by mouth  every morning. For depression   Vitamin D 2000 units Caps Take 1 capsule (2,000 Units total) by mouth daily.       Review of Systems  Constitutional: Negative for activity change, appetite change, chills, fatigue and fever.  HENT: Negative for congestion, rhinorrhea, sinus pressure, sneezing and sore throat.   Eyes: Negative.   Respiratory: Negative for cough, chest tightness, shortness of breath and wheezing.        Oxygen saturations in the 70's previous day.   Cardiovascular: Negative for chest pain, palpitations and leg swelling.  Gastrointestinal: Negative for abdominal distention, abdominal pain, constipation, diarrhea, nausea and vomiting.  Genitourinary: Negative for difficulty urinating, dysuria, flank pain, frequency, urgency, vaginal discharge and vaginal pain.  Musculoskeletal: Positive for gait problem.       Left hand swelling and painful  Skin: Negative for color change, pallor and rash.  Neurological: Negative for dizziness, seizures, light-headedness and headaches.  Hematological: Does not bruise/bleed easily.  Psychiatric/Behavioral: Negative for agitation, confusion, hallucinations and sleep disturbance. The patient is not nervous/anxious.     Immunization History  Administered Date(s) Administered  . Influenza-Unspecified 05/09/2013, 04/27/2014, 04/17/2015, 09/17/2015, 07/23/2016  . PPD Test 12/22/2012, 03/14/2014, 03/08/2015  . Pneumococcal-Unspecified 05/09/2013, 09/09/2013   Pertinent  Health Maintenance Due  Topic Date Due  . INFLUENZA VACCINE  01/28/2017  . DEXA SCAN  Completed  . PNA vac Low Risk Adult  Completed   Fall Risk  10/04/2014  Falls in the past year? Yes  Number falls in past yr: 2 or more  Injury with Fall? No  Risk Factor Category  High Fall Risk  Risk for fall due to : Impaired balance/gait;Impaired mobility;Medication side effect;History of fall(s)  Follow up Education provided;Falls evaluation completed;Falls prevention discussed     Vitals:   11/04/16 1252  BP: (!) 144/78  Pulse: 76  Resp: 18  Temp: 97.8 F (36.6 C)  SpO2: 96%  Weight: 131 lb 8 oz (59.6 kg)  Height: 5\' 6"  (1.676 m)   Body mass index is 21.22 kg/m. Physical Exam  Constitutional:  She is oriented to person, place, and time. She appears well-developed and well-nourished. No distress.  HENT:  Head: Normocephalic and atraumatic.  Mouth/Throat: Oropharynx is clear and moist.  Eyes: Conjunctivae and EOM are normal. Pupils are equal, round, and reactive to light. Right eye exhibits no discharge. Left eye exhibits no discharge. No scleral icterus.  Neck: Normal range of motion. No JVD present. No thyromegaly present.  Cardiovascular: Normal rate, regular rhythm, normal heart sounds and intact distal pulses.  Exam reveals no gallop and no friction rub.   No murmur heard. Pulmonary/Chest: Effort normal. No respiratory distress. She has no wheezes. She has no rales.  Bilateral lower lobes diminished to auscultation    Abdominal: Soft. Bowel sounds are normal. She exhibits no distension. There is no tenderness. There is no rebound and no guarding.  Genitourinary: No vaginal discharge found.  Genitourinary Comments: Continent.   Musculoskeletal: She exhibits no edema, tenderness or deformity.  Moves x 4 extremities.Unsteady gait.Left hand red, swollen and tender to touch.   Lymphadenopathy:    She has no cervical adenopathy.  Neurological: She is oriented to person, place, and time.  Skin: Skin is warm and dry. No rash noted. No erythema.  Psychiatric: She has a normal mood and affect.    Labs reviewed:  Recent Labs  09/16/16 0615 09/17/16 1043 09/18/16 0827 09/29/16  NA 141 142 142 146  K 4.0 3.1* 3.1* 3.8  CL 105 109 108  --   CO2 25 24 26   --   GLUCOSE 143* 95 86  --   BUN 23* 21* 16 17  CREATININE 1.68* 1.58* 1.53* 1.2*  CALCIUM 8.3* 8.0* 7.9*  --     Recent Labs  09/04/16 2139 09/15/16 1346 09/16/16 0615 09/29/16  AST 36 32  25 16  ALT 19 17 14 13   ALKPHOS 49 43 44 51  BILITOT 0.8 0.6 0.6  --   PROT 5.8* 5.2* 4.7*  --   ALBUMIN 3.2* 2.7* 2.2*  --     Recent Labs  09/04/16 2139  09/15/16 1346 09/16/16 0615 09/17/16 1043  WBC 10.9*  --  15.2* 10.1 9.0  NEUTROABS 9.1*  --  12.7* 8.8*  --   HGB 11.4*  < > 10.4* 9.4* 9.3*  HCT 36.3  < > 33.4* 30.0* 29.4*  MCV 87.3  --  89.1 88.5 87.8  PLT 153  --  133* 127* 122*  < > = values in this interval not displayed. Lab Results  Component Value Date   TSH 0.251 (L) 09/16/2016   Lab Results  Component Value Date   HGBA1C 6.4 01/11/2016   Lab Results  Component Value Date   CHOL 109 01/11/2016   HDL 37 01/11/2016   LDLCALC 39 01/11/2016   LDLDIRECT 60.7 11/13/2010   TRIG 167 (A) 01/11/2016   CHOLHDL 2.9 12/02/2011    Assessment/Plan Hand swelling/Pain  Afebrile.Left hand swollen, diffused redness, warm and tender to touch.with her history of cellulitis to this hand will start on oral antibiotics. Doxycycline 100 mg Tablet twice daily X 7 days. florastor 250 mg capsule twice daily x 10 days.check CBC/diff, CMP, uric Acid and Sed rate 11/05/2016.continue current pain regimen. X-ray left hand and wrist rule out fracture.    Confused Afebrile. Check urine specimen for U/A and C/S rule out UTI  Hypoxia    Afebrile.Sats dropped in the 70's per patient's daughter.Decreased breath sounds to lower lobe. Continue on oxygen via [place].Obtain portable CXR Pa/lat rule out pneumonia.  Family/ staff Communication: reviewed plan of care with patient and facility Nurse supervisor.   Labs/tests ordered:CBC/diff, CMP, uric Acid and Sed rate 11/05/2016.continue current pain regimen. portable CXR Pa/lat rule out pneumonia.  U/A and C/S rule out UTI

## 2016-11-13 ENCOUNTER — Encounter (HOSPITAL_COMMUNITY): Payer: Self-pay | Admitting: Emergency Medicine

## 2016-11-13 ENCOUNTER — Non-Acute Institutional Stay (SKILLED_NURSING_FACILITY): Payer: Medicare Other | Admitting: Internal Medicine

## 2016-11-13 ENCOUNTER — Inpatient Hospital Stay (HOSPITAL_COMMUNITY)
Admission: EM | Admit: 2016-11-13 | Discharge: 2016-11-19 | DRG: 871 | Disposition: A | Discharge status: Skilled Nursing Facility | Payer: Medicare Other | Attending: Internal Medicine | Admitting: Internal Medicine

## 2016-11-13 ENCOUNTER — Emergency Department (HOSPITAL_COMMUNITY): Payer: Medicare Other

## 2016-11-13 ENCOUNTER — Encounter: Payer: Self-pay | Admitting: Internal Medicine

## 2016-11-13 DIAGNOSIS — I5033 Acute on chronic diastolic (congestive) heart failure: Secondary | ICD-10-CM | POA: Diagnosis not present

## 2016-11-13 DIAGNOSIS — Z9981 Dependence on supplemental oxygen: Secondary | ICD-10-CM | POA: Diagnosis not present

## 2016-11-13 DIAGNOSIS — R4182 Altered mental status, unspecified: Secondary | ICD-10-CM

## 2016-11-13 DIAGNOSIS — I252 Old myocardial infarction: Secondary | ICD-10-CM

## 2016-11-13 DIAGNOSIS — J9621 Acute and chronic respiratory failure with hypoxia: Secondary | ICD-10-CM | POA: Diagnosis not present

## 2016-11-13 DIAGNOSIS — F329 Major depressive disorder, single episode, unspecified: Secondary | ICD-10-CM | POA: Diagnosis present

## 2016-11-13 DIAGNOSIS — F0390 Unspecified dementia without behavioral disturbance: Secondary | ICD-10-CM | POA: Diagnosis present

## 2016-11-13 DIAGNOSIS — Z8673 Personal history of transient ischemic attack (TIA), and cerebral infarction without residual deficits: Secondary | ICD-10-CM | POA: Diagnosis not present

## 2016-11-13 DIAGNOSIS — R5381 Other malaise: Secondary | ICD-10-CM | POA: Diagnosis present

## 2016-11-13 DIAGNOSIS — R739 Hyperglycemia, unspecified: Secondary | ICD-10-CM | POA: Diagnosis present

## 2016-11-13 DIAGNOSIS — Z86718 Personal history of other venous thrombosis and embolism: Secondary | ICD-10-CM | POA: Diagnosis not present

## 2016-11-13 DIAGNOSIS — L03114 Cellulitis of left upper limb: Secondary | ICD-10-CM | POA: Diagnosis not present

## 2016-11-13 DIAGNOSIS — B349 Viral infection, unspecified: Secondary | ICD-10-CM | POA: Diagnosis present

## 2016-11-13 DIAGNOSIS — E872 Acidosis: Secondary | ICD-10-CM | POA: Diagnosis present

## 2016-11-13 DIAGNOSIS — R2689 Other abnormalities of gait and mobility: Secondary | ICD-10-CM | POA: Diagnosis not present

## 2016-11-13 DIAGNOSIS — I248 Other forms of acute ischemic heart disease: Secondary | ICD-10-CM | POA: Diagnosis present

## 2016-11-13 DIAGNOSIS — I130 Hypertensive heart and chronic kidney disease with heart failure and stage 1 through stage 4 chronic kidney disease, or unspecified chronic kidney disease: Secondary | ICD-10-CM | POA: Diagnosis present

## 2016-11-13 DIAGNOSIS — J189 Pneumonia, unspecified organism: Secondary | ICD-10-CM | POA: Diagnosis not present

## 2016-11-13 DIAGNOSIS — I2510 Atherosclerotic heart disease of native coronary artery without angina pectoris: Secondary | ICD-10-CM | POA: Diagnosis present

## 2016-11-13 DIAGNOSIS — Z981 Arthrodesis status: Secondary | ICD-10-CM | POA: Diagnosis not present

## 2016-11-13 DIAGNOSIS — R1312 Dysphagia, oropharyngeal phase: Secondary | ICD-10-CM | POA: Diagnosis not present

## 2016-11-13 DIAGNOSIS — R488 Other symbolic dysfunctions: Secondary | ICD-10-CM | POA: Diagnosis not present

## 2016-11-13 DIAGNOSIS — K219 Gastro-esophageal reflux disease without esophagitis: Secondary | ICD-10-CM | POA: Diagnosis present

## 2016-11-13 DIAGNOSIS — R0989 Other specified symptoms and signs involving the circulatory and respiratory systems: Secondary | ICD-10-CM

## 2016-11-13 DIAGNOSIS — M6281 Muscle weakness (generalized): Secondary | ICD-10-CM | POA: Diagnosis not present

## 2016-11-13 DIAGNOSIS — R6520 Severe sepsis without septic shock: Secondary | ICD-10-CM | POA: Diagnosis not present

## 2016-11-13 DIAGNOSIS — T380X5A Adverse effect of glucocorticoids and synthetic analogues, initial encounter: Secondary | ICD-10-CM | POA: Diagnosis present

## 2016-11-13 DIAGNOSIS — E039 Hypothyroidism, unspecified: Secondary | ICD-10-CM | POA: Diagnosis not present

## 2016-11-13 DIAGNOSIS — I360 Nonrheumatic tricuspid (valve) stenosis: Secondary | ICD-10-CM | POA: Diagnosis not present

## 2016-11-13 DIAGNOSIS — I422 Other hypertrophic cardiomyopathy: Secondary | ICD-10-CM | POA: Diagnosis present

## 2016-11-13 DIAGNOSIS — R0602 Shortness of breath: Secondary | ICD-10-CM | POA: Diagnosis not present

## 2016-11-13 DIAGNOSIS — E785 Hyperlipidemia, unspecified: Secondary | ICD-10-CM | POA: Diagnosis present

## 2016-11-13 DIAGNOSIS — R509 Fever, unspecified: Secondary | ICD-10-CM | POA: Diagnosis not present

## 2016-11-13 DIAGNOSIS — Z79899 Other long term (current) drug therapy: Secondary | ICD-10-CM | POA: Diagnosis not present

## 2016-11-13 DIAGNOSIS — M810 Age-related osteoporosis without current pathological fracture: Secondary | ICD-10-CM | POA: Diagnosis present

## 2016-11-13 DIAGNOSIS — E876 Hypokalemia: Secondary | ICD-10-CM | POA: Diagnosis not present

## 2016-11-13 DIAGNOSIS — R05 Cough: Secondary | ICD-10-CM

## 2016-11-13 DIAGNOSIS — D419 Neoplasm of uncertain behavior of unspecified urinary organ: Secondary | ICD-10-CM | POA: Diagnosis not present

## 2016-11-13 DIAGNOSIS — N183 Chronic kidney disease, stage 3 unspecified: Secondary | ICD-10-CM | POA: Diagnosis present

## 2016-11-13 DIAGNOSIS — A419 Sepsis, unspecified organism: Secondary | ICD-10-CM | POA: Diagnosis not present

## 2016-11-13 DIAGNOSIS — I6990 Unspecified sequelae of unspecified cerebrovascular disease: Secondary | ICD-10-CM | POA: Diagnosis not present

## 2016-11-13 DIAGNOSIS — J95859 Other complication of respirator [ventilator]: Secondary | ICD-10-CM | POA: Diagnosis not present

## 2016-11-13 DIAGNOSIS — M069 Rheumatoid arthritis, unspecified: Secondary | ICD-10-CM | POA: Diagnosis present

## 2016-11-13 DIAGNOSIS — Z7901 Long term (current) use of anticoagulants: Secondary | ICD-10-CM

## 2016-11-13 DIAGNOSIS — Z7952 Long term (current) use of systemic steroids: Secondary | ICD-10-CM

## 2016-11-13 DIAGNOSIS — N390 Urinary tract infection, site not specified: Secondary | ICD-10-CM | POA: Diagnosis not present

## 2016-11-13 DIAGNOSIS — R6510 Systemic inflammatory response syndrome (SIRS) of non-infectious origin without acute organ dysfunction: Secondary | ICD-10-CM

## 2016-11-13 DIAGNOSIS — I5032 Chronic diastolic (congestive) heart failure: Secondary | ICD-10-CM | POA: Diagnosis not present

## 2016-11-13 DIAGNOSIS — I5031 Acute diastolic (congestive) heart failure: Secondary | ICD-10-CM | POA: Diagnosis not present

## 2016-11-13 DIAGNOSIS — R059 Cough, unspecified: Secondary | ICD-10-CM

## 2016-11-13 LAB — BASIC METABOLIC PANEL
Anion gap: 12 (ref 5–15)
BUN: 29 mg/dL — ABNORMAL HIGH (ref 6–20)
CO2: 24 mmol/L (ref 22–32)
Calcium: 8.9 mg/dL (ref 8.9–10.3)
Chloride: 105 mmol/L (ref 101–111)
Creatinine, Ser: 1.59 mg/dL — ABNORMAL HIGH (ref 0.44–1.00)
GFR calc Af Amer: 33 mL/min — ABNORMAL LOW (ref >60)
GFR calc non Af Amer: 29 mL/min — ABNORMAL LOW (ref >60)
Glucose, Bld: 189 mg/dL — ABNORMAL HIGH (ref 65–99)
Potassium: 3.6 mmol/L (ref 3.5–5.1)
Sodium: 141 mmol/L (ref 135–145)

## 2016-11-13 LAB — CBC WITH DIFFERENTIAL/PLATELET
Basophils Absolute: 0 10*3/uL (ref 0.0–0.1)
Basophils Relative: 0 %
Eosinophils Absolute: 0.2 10*3/uL (ref 0.0–0.7)
Eosinophils Relative: 2 %
HCT: 37.5 % (ref 36.0–46.0)
Hemoglobin: 11.7 g/dL — ABNORMAL LOW (ref 12.0–15.0)
Lymphocytes Relative: 9 %
Lymphs Abs: 1.2 10*3/uL (ref 0.7–4.0)
MCH: 28.5 pg (ref 26.0–34.0)
MCHC: 31.2 g/dL (ref 30.0–36.0)
MCV: 91.5 fL (ref 78.0–100.0)
Monocytes Absolute: 0.6 10*3/uL (ref 0.1–1.0)
Monocytes Relative: 5 %
Neutro Abs: 10.8 10*3/uL — ABNORMAL HIGH (ref 1.7–7.7)
Neutrophils Relative %: 84 %
Platelets: 173 10*3/uL (ref 150–400)
RBC: 4.1 MIL/uL (ref 3.87–5.11)
RDW: 14.9 % (ref 11.5–15.5)
WBC: 12.8 10*3/uL — ABNORMAL HIGH (ref 4.0–10.5)

## 2016-11-13 LAB — URINALYSIS, ROUTINE W REFLEX MICROSCOPIC
Bacteria, UA: NONE SEEN
Bilirubin Urine: NEGATIVE
Glucose, UA: 150 mg/dL — AB
Hgb urine dipstick: NEGATIVE
Ketones, ur: NEGATIVE mg/dL
Leukocytes, UA: NEGATIVE
Nitrite: NEGATIVE
Protein, ur: 30 mg/dL — AB
Specific Gravity, Urine: 1.018 (ref 1.005–1.030)
Squamous Epithelial / LPF: NONE SEEN
pH: 5 (ref 5.0–8.0)

## 2016-11-13 LAB — LACTIC ACID, PLASMA
Lactic Acid, Venous: 3.3 mmol/L (ref 0.5–1.9)
Lactic Acid, Venous: 7.7 mmol/L (ref 0.5–1.9)

## 2016-11-13 MED ORDER — LORATADINE 10 MG PO TABS
10.0000 mg | ORAL_TABLET | Freq: Every day | ORAL | Status: DC
  Administered 2016-11-14 – 2016-11-19 (×6): 10 mg via ORAL
  Filled 2016-11-13 (×6): qty 1

## 2016-11-13 MED ORDER — SODIUM CHLORIDE 0.9 % IV BOLUS (SEPSIS)
2000.0000 mL | Freq: Once | INTRAVENOUS | Status: AC
  Administered 2016-11-13: 2000 mL via INTRAVENOUS

## 2016-11-13 MED ORDER — PIPERACILLIN-TAZOBACTAM 3.375 G IVPB 30 MIN
3.3750 g | Freq: Once | INTRAVENOUS | Status: AC
  Administered 2016-11-13: 3.375 g via INTRAVENOUS

## 2016-11-13 MED ORDER — PIPERACILLIN-TAZOBACTAM 3.375 G IVPB 30 MIN
3.3750 g | Freq: Once | INTRAVENOUS | Status: DC
  Administered 2016-11-13: 3.375 g via INTRAVENOUS
  Filled 2016-11-13: qty 50

## 2016-11-13 MED ORDER — APIXABAN 5 MG PO TABS
5.0000 mg | ORAL_TABLET | Freq: Two times a day (BID) | ORAL | Status: DC
  Administered 2016-11-14 (×2): 5 mg via ORAL
  Filled 2016-11-13 (×2): qty 1

## 2016-11-13 MED ORDER — LORAZEPAM 0.5 MG PO TABS
0.5000 mg | ORAL_TABLET | Freq: Every day | ORAL | Status: DC
  Administered 2016-11-14 – 2016-11-18 (×6): 0.5 mg via ORAL
  Filled 2016-11-13 (×6): qty 1

## 2016-11-13 MED ORDER — ALBUTEROL (5 MG/ML) CONTINUOUS INHALATION SOLN
10.0000 mg/h | INHALATION_SOLUTION | RESPIRATORY_TRACT | Status: DC
  Administered 2016-11-13: 10 mg/h via RESPIRATORY_TRACT
  Filled 2016-11-13: qty 20

## 2016-11-13 MED ORDER — PREDNISONE 5 MG PO TABS
10.0000 mg | ORAL_TABLET | Freq: Every day | ORAL | Status: DC
  Administered 2016-11-14 – 2016-11-19 (×6): 10 mg via ORAL
  Filled 2016-11-13: qty 2
  Filled 2016-11-13: qty 1
  Filled 2016-11-13: qty 2
  Filled 2016-11-13 (×3): qty 1
  Filled 2016-11-13: qty 2

## 2016-11-13 MED ORDER — VANCOMYCIN HCL IN DEXTROSE 1-5 GM/200ML-% IV SOLN
1000.0000 mg | Freq: Once | INTRAVENOUS | Status: AC
  Administered 2016-11-13: 1000 mg via INTRAVENOUS
  Filled 2016-11-13: qty 200

## 2016-11-13 MED ORDER — ROSUVASTATIN CALCIUM 20 MG PO TABS
20.0000 mg | ORAL_TABLET | Freq: Every evening | ORAL | Status: DC
  Administered 2016-11-14 – 2016-11-18 (×5): 20 mg via ORAL
  Filled 2016-11-13 (×5): qty 1

## 2016-11-13 MED ORDER — MONTELUKAST SODIUM 10 MG PO TABS
10.0000 mg | ORAL_TABLET | Freq: Every day | ORAL | Status: DC
  Administered 2016-11-14 – 2016-11-18 (×5): 10 mg via ORAL
  Filled 2016-11-13 (×5): qty 1

## 2016-11-13 MED ORDER — SODIUM CHLORIDE 0.9 % IV BOLUS (SEPSIS)
1000.0000 mL | Freq: Once | INTRAVENOUS | Status: AC
  Administered 2016-11-13: 1000 mL via INTRAVENOUS

## 2016-11-13 MED ORDER — DEXTROSE 5 % IV SOLN
2.0000 g | Freq: Once | INTRAVENOUS | Status: AC
  Administered 2016-11-14: 2 g via INTRAVENOUS
  Filled 2016-11-13: qty 2

## 2016-11-13 MED ORDER — HYDROCODONE-ACETAMINOPHEN 5-325 MG PO TABS
1.0000 | ORAL_TABLET | Freq: Every day | ORAL | Status: DC
  Administered 2016-11-14 – 2016-11-19 (×5): 1 via ORAL
  Filled 2016-11-13 (×6): qty 1

## 2016-11-13 MED ORDER — MECLIZINE HCL 25 MG PO TABS
12.5000 mg | ORAL_TABLET | Freq: Two times a day (BID) | ORAL | Status: DC
  Administered 2016-11-14 – 2016-11-19 (×12): 12.5 mg via ORAL
  Filled 2016-11-13 (×14): qty 1

## 2016-11-13 MED ORDER — PANTOPRAZOLE SODIUM 40 MG PO TBEC
40.0000 mg | DELAYED_RELEASE_TABLET | Freq: Every day | ORAL | Status: DC
  Administered 2016-11-14 – 2016-11-19 (×6): 40 mg via ORAL
  Filled 2016-11-13 (×6): qty 1

## 2016-11-13 MED ORDER — IPRATROPIUM-ALBUTEROL 0.5-2.5 (3) MG/3ML IN SOLN: 3.0000 mL | Freq: Three times a day (TID) | RESPIRATORY_TRACT | Status: DC | PRN

## 2016-11-13 MED ORDER — SODIUM CHLORIDE 0.9 % IV BOLUS (SEPSIS): 500.0000 mL | Freq: Once | INTRAVENOUS | Status: DC

## 2016-11-13 MED ORDER — SODIUM CHLORIDE 0.9 % IV SOLN
INTRAVENOUS | Status: DC
  Administered 2016-11-14: 02:00:00 via INTRAVENOUS
  Administered 2016-11-15: 1000 mL via INTRAVENOUS

## 2016-11-13 MED ORDER — SODIUM CHLORIDE 0.9 % IV BOLUS (SEPSIS): 250.0000 mL | Freq: Once | INTRAVENOUS | Status: DC

## 2016-11-13 MED ORDER — SERTRALINE HCL 50 MG PO TABS
50.0000 mg | ORAL_TABLET | Freq: Every morning | ORAL | Status: DC
  Administered 2016-11-14 – 2016-11-19 (×6): 50 mg via ORAL
  Filled 2016-11-13 (×6): qty 1

## 2016-11-13 MED ORDER — LEVOTHYROXINE SODIUM 25 MCG PO TABS
25.0000 ug | ORAL_TABLET | Freq: Every day | ORAL | Status: DC
  Administered 2016-11-14 – 2016-11-19 (×6): 25 ug via ORAL
  Filled 2016-11-13 (×6): qty 1

## 2016-11-13 MED ORDER — VANCOMYCIN HCL IN DEXTROSE 1-5 GM/200ML-% IV SOLN: 1000.0000 mg | Freq: Once | INTRAVENOUS | Status: DC

## 2016-11-13 MED ORDER — SODIUM CHLORIDE 0.9 % IV SOLN
INTRAVENOUS | Status: DC
  Administered 2016-11-13: 17:00:00 via INTRAVENOUS

## 2016-11-13 MED ORDER — METOPROLOL TARTRATE 50 MG PO TABS
50.0000 mg | ORAL_TABLET | Freq: Two times a day (BID) | ORAL | Status: DC
  Administered 2016-11-14 – 2016-11-19 (×12): 50 mg via ORAL
  Filled 2016-11-13 (×2): qty 2
  Filled 2016-11-13 (×2): qty 1
  Filled 2016-11-13 (×2): qty 2
  Filled 2016-11-13: qty 1
  Filled 2016-11-13 (×4): qty 2
  Filled 2016-11-13: qty 1

## 2016-11-13 MED ORDER — POLYVINYL ALCOHOL 1.4 % OP SOLN
1.0000 [drp] | Freq: Three times a day (TID) | OPHTHALMIC | Status: DC
  Administered 2016-11-14 – 2016-11-19 (×16): 1 [drp] via OPHTHALMIC
  Filled 2016-11-13: qty 15

## 2016-11-13 NOTE — ED Provider Notes (Signed)
[facility] DEPT Provider Note   CSN: [id number] Arrival date & time: 11/13/16  1536     History   Chief Complaint Chief Complaint  Patient presents with  . Fever  . Shortness of Breath    HPI [name] is a 83 y.o. female.  HPI   83 year old female with cough, subjective fever and dyspnea. She reports onset of symptoms last night. Persistent since then. Cough is nonproductive. She feels like she is wheezing. Upper back pain which is worse with coughing. Denies any chest pain. No unusual leg pain or swelling. She received 10 mg of albuterol, 0.5 mg of Atrovent & 125 mg of Solu-Medrol by EMS prior to arrival. She feels the breathing treatment did help her.  Past Medical History:  Diagnosis Date  . Anxiety   . Arthritis   . Chronic diastolic CHF (congestive heart failure) ([place])    a. 10/2014 Echo: EF 60-65%, mild LVH, grade 1 DD, dynamic obstruction @ rest - peak velocity of 271 cm/sec, peak grad of 48mmHg, PASP 64mmHg.  Marland Kitchen Coronary artery disease    a. 2012 s/p NSTEMI/Cath: 95% apical LAD dzs, otw nonobs dzs-->Med Rx;  b. 05/2011 MV: no ischemia, EF 79%, inf infarct- attenuation.  . CVA (cerebral vascular accident) ([place])   . Depression   . DVT of lower extremity (deep venous thrombosis) ([place])   . Dyspnea   . Elevated troponin    a. 10/2014 in setting of sepsis/?HCAP.  Marland Kitchen Enteritis due to Clostridium difficile 01/04/2013  . Essential hypertension   . Fall   . Fatigue   . GERD (gastroesophageal reflux disease)   . GI bleed 2005  . HCAP (healthcare-associated pneumonia) 01/22/2013  . Hemorrhoids   . Hiatal hernia   . Hyperlipidemia   . Hypothyroidism   . Malnutrition ([place])   . Obstructive hypertrophic cardiomyopathy ([place])    a. 10/2014 Echo: EF 60-65%, mild LVH, grade 1 DD, dynamic obstruction @ rest - peak velocity of 271 cm/sec, peak grad of 55mmHg, PASP 62mmHg.  . Osteoporosis   . PUD (peptic ulcer disease)   . RA (rheumatoid arthritis) ([place])   . Recurrent colitis  due to Clostridium difficile 06/02/2012  . Renal insufficiency   . Right ear pain   . Sepsis ([place]) 12/19/2012  . TIA (transient ischemic attack)   . Weight loss     Patient Active Problem List   Diagnosis Date Noted  . Sepsis ([place]) 09/15/2016  . Wrist swelling, left 09/15/2016  . Acute encephalopathy 09/15/2016  . Pain and swelling of left wrist   . UTI (urinary tract infection) 04/08/2016  . Benign paroxysmal positional vertigo 12/19/2015  . Easy bruising 09/20/2015  . CKD (chronic kidney disease) stage 3, GFR 30-59 ml/min 07/27/2015  . Anemia of chronic disease 07/27/2015  . Pulmonary hypertension ([place])   . Generalized osteoarthritis 04/11/2015  . Thyroid activity decreased 04/11/2015  . Coronary artery disease   . Obstructive hypertrophic cardiomyopathy ([place])   . Chronic diastolic CHF (congestive heart failure) ([place])   . Vertigo 05/17/2014  . Hemorrhoids 04/24/2014  . Hypothyroidism 12/16/2013  . HLD (hyperlipidemia) 12/16/2013  . Benign hypertensive heart and kidney disease with diastolic CHF, NYHA class II and CKD stage III ([place]) 12/16/2013  . Allergic rhinitis 07/05/2013  . Acute kidney injury superimposed on CKD ([place]) 01/22/2013  . Adrenal insufficiency ([place]) 12/08/2011    Class: Chronic  . History of CVA (cerebrovascular accident) 12/07/2011    Class: Acute  . Dysphagia 12/07/2011  Class: Acute  . MI (myocardial infarction) ([place])   . RA (rheumatoid arthritis) ([place])   . PUD (peptic ulcer disease)   . Hiatal hernia with gastroesophageal reflux   . Hiatal hernia   . Osteoporosis   . Depression   . Anxiety     Past Surgical History:  Procedure Laterality Date  . ANKLE SURGERY    . BACK SURGERY    . [place]   x3  . CARDIAC CATHETERIZATION     SHOWED RUPTURE PLAQUE IN THE LAD. THE LAD IS NONOBSTRUCTIVE WITH ONLY 30-40% STENOSIS  . COLONOSCOPY    . KNEE SURGERY    . NSTEMI  06/2010  . SPINAL FUSION SURGERY      OB History    No data  available       Home Medications    Prior to Admission medications   Medication Sig Start Date End Date Taking? Authorizing Provider  apixaban (ELIQUIS) 5 MG TABS tablet Take 5 mg by mouth 2 (two) times daily. For DVT   Yes [provider]  Artificial Saliva (ACT DRY MOUTH) LOZG Use as directed in the mouth or throat as needed (for dry mouth).   Yes [provider]  calcium-vitamin D (OSCAL WITH D) 500-200 MG-UNIT per tablet Take 1 tablet by mouth 3 (three) times daily.   Yes [provider]  carboxymethylcellulose 1 % ophthalmic solution Apply 1 drop to eye 3 (three) times daily. Both eyes   Yes [provider]  cetirizine (ZYRTEC) 10 MG tablet Take 10 mg by mouth daily.    Yes [provider]  Cholecalciferol (VITAMIN D) 2000 UNITS CAPS Take 1 capsule (2,000 Units total) by mouth daily. 11/21/13  Yes [name], NP  Cranberry 200 MG CAPS Take 2 capsules by mouth 2 (two) times daily.    Yes [provider]  denosumab (PROLIA) 60 MG/ML SOLN injection Inject 60 mg into the skin every 6 (six) months. Administer in upper arm, thigh, or abdomen   Yes [provider]  esomeprazole (NEXIUM) 40 MG capsule Take 40 mg by mouth 2 (two) times daily before a meal.    Yes [provider]  ferrous sulfate 325 (65 FE) MG tablet Take 325 mg by mouth daily.    Yes [provider]  guaiFENesin (ROBITUSSIN) 100 MG/5ML liquid Take 200 mg by mouth 4 (four) times daily as needed for cough.   Yes [provider]  hydrochlorothiazide (HYDRODIURIL) 12.5 MG tablet Take 1 tablet (12.5 mg total) by mouth daily. 01/14/16  Yes Ngetich, Dinah C, NP  HYDROcodone-acetaminophen (NORCO/VICODIN) 5-325 MG tablet Take 1 tablet by mouth daily.   Yes [provider]  ipratropium (ATROVENT) 0.03 % nasal spray Place 2 sprays into both nostrils daily as needed (For asthma).    Yes [provider]  ipratropium-albuterol  (DUONEB) 0.5-2.5 (3) MG/3ML SOLN Take 3 mLs by nebulization every 8 (eight) hours as needed (wheezing).    Yes [provider]  levothyroxine (SYNTHROID, LEVOTHROID) 25 MCG tablet Take 1 tablet (25 mcg total) by mouth daily before breakfast. 09/19/16  Yes Rosita Fire, MD  lidocaine (LIDODERM) 5 % Place 1 patch onto the skin daily. Remove & Discard patch within 12 hours or as directed by MD 04/04/15  Yes Lauree Chandler, NP  LORazepam (ATIVAN) 0.5 MG tablet Take one tablet by mouth every night at bedtime for anxiety 07/24/16  Yes Reed, Tiffany L, DO  meclizine (ANTIVERT) 12.5  MG tablet Take 12.5 mg by mouth 2 (two) times daily.    Yes [provider]  Menthol, Topical Analgesic, (BIOFREEZE) 4 % GEL Apply 1 application topically 2 (two) times daily. For wrist pain    Yes [provider]  metoprolol (LOPRESSOR) 50 MG tablet Take 50 mg by mouth 2 (two) times daily.    Yes [provider]  montelukast (SINGULAIR) 10 MG tablet Take 10 mg by mouth at bedtime.    Yes [provider]  Multiple Vitamins-Minerals (CERTAVITE/ANTIOXIDANTS PO) Take 1 tablet by mouth daily.    Yes [provider]  nitrofurantoin, macrocrystal-monohydrate, (MACROBID) 100 MG capsule Take 100 mg by mouth daily.    Yes [provider]  nitroGLYCERIN (NITROSTAT) 0.4 MG SL tablet Place 0.4 mg under the tongue every 5 (five) minutes as needed for chest pain.   Yes [provider]  ondansetron (ZOFRAN) 4 MG tablet Take 4 mg by mouth every 6 (six) hours as needed for nausea or vomiting.   Yes [provider]  OXYGEN Inhale 2 L into the lungs as needed (To maintain saturations >90%).    Yes [provider]  PATADAY 0.2 % SOLN Place 1 drop into both eyes daily.  03/26/15  Yes [provider]  potassium chloride SA (K-DUR,KLOR-CON) 20 MEQ tablet Take 1 tablet (20 mEq total) by mouth daily. 09/19/16  Yes Rosita Fire, MD    predniSONE (DELTASONE) 10 MG tablet Take 10 mg by mouth daily. For RA 03/08/15  Yes [provider]  rosuvastatin (CRESTOR) 20 MG tablet Take 20 mg by mouth every evening. For hyperlipidemia 07/05/13  Yes [name], NP  saccharomyces boulardii (FLORASTOR) 250 MG capsule Take 1 capsule (250 mg total) by mouth 2 (two) times daily. 11/04/16 11/14/16 Yes Ngetich, Dinah C, NP  sertraline (ZOLOFT) 50 MG tablet Take 50 mg by mouth every morning. For depression   Yes [provider]    Family History Family History  Problem Relation Age of Onset  . Kidney disease Mother   . Kidney disease Brother   . Lung cancer Father     Social History Social History  Substance Use Topics  . Smoking status: Never Smoker  . Smokeless tobacco: Never Used  . Alcohol use No     Allergies   Codeine; Sulfa antibiotics; Biphosphate; Morphine and related; Percocet [oxycodone-acetaminophen]; and Plavix [clopidogrel bisulfate]   Review of Systems Review of Systems  All systems reviewed and negative, other than as noted in HPI.   Physical Exam Updated Vital Signs BP 127/62 (BP Location: Left Arm)   Pulse (!) 118   Temp 99.6 F (37.6 C) (Oral)   Resp 19   Ht 5\' 4"  (1.626 m)   Wt 125 lb (56.7 kg)   LMP  (LMP Unknown)   SpO2 95%   BMI 21.46 kg/m   Physical Exam  Constitutional: She appears well-developed and well-nourished. No distress.  HENT:  Head: Normocephalic and atraumatic.  Eyes: Conjunctivae are normal. Right eye exhibits no discharge. Left eye exhibits no discharge.  Neck: Neck supple.  Cardiovascular: Normal rate, regular rhythm and normal heart sounds.  Exam reveals no gallop and no friction rub.   No murmur heard. Pulmonary/Chest: Effort normal. No respiratory distress. She has wheezes.  Abdominal: Soft. She exhibits no distension. There is no tenderness.  Musculoskeletal: She exhibits edema. She exhibits no tenderness.  Lower extremities symmetric as compared to  each other. No calf tenderness. Negative Homan's. No  palpable cords.   Neurological: She is alert.  Skin: Skin is warm and dry.  Psychiatric: She has a normal mood and affect. Her behavior is normal. Thought content normal.  Nursing note and vitals reviewed.    ED Treatments / Results  Labs (all labs ordered are listed, but only abnormal results are displayed) Labs Reviewed  CBC WITH DIFFERENTIAL/PLATELET - Abnormal; Notable for the following:       Result Value   WBC 12.8 (*)    Hemoglobin 11.7 (*)    Neutro Abs 10.8 (*)    All other components within normal limits  BASIC METABOLIC PANEL - Abnormal; Notable for the following:    Glucose, Bld 189 (*)    BUN 29 (*)    Creatinine, Ser 1.59 (*)    GFR calc non Af Amer 29 (*)    GFR calc Af Amer 33 (*)    All other components within normal limits  LACTIC ACID, PLASMA - Abnormal; Notable for the following:    Lactic Acid, Venous 3.3 (*)    All other components within normal limits  LACTIC ACID, PLASMA - Abnormal; Notable for the following:    Lactic Acid, Venous 7.7 (*)    All other components within normal limits  URINALYSIS, ROUTINE W REFLEX MICROSCOPIC - Abnormal; Notable for the following:    Glucose, UA 150 (*)    Protein, ur 30 (*)    All other components within normal limits  LACTIC ACID, PLASMA - Abnormal; Notable for the following:    Lactic Acid, Venous 8.7 (*)    All other components within normal limits  LACTIC ACID, PLASMA - Abnormal; Notable for the following:    Lactic Acid, Venous 5.9 (*)    All other components within normal limits  PROTIME-INR - Abnormal; Notable for the following:    Prothrombin Time 16.6 (*)    All other components within normal limits  APTT - Abnormal; Notable for the following:    aPTT 20 (*)    All other components within normal limits  BASIC METABOLIC PANEL - Abnormal; Notable for the following:    Chloride 114 (*)    CO2 19 (*)    Glucose, Bld 170 (*)    BUN 26 (*)     Creatinine, Ser 1.34 (*)    Calcium 7.4 (*)    GFR calc non Af Amer 35 (*)    GFR calc Af Amer 41 (*)    All other components within normal limits  CBC WITH DIFFERENTIAL/PLATELET - Abnormal; Notable for the following:    WBC 11.6 (*)    RBC 3.40 (*)    Hemoglobin 10.0 (*)    HCT 31.2 (*)    Platelets 147 (*)    Neutro Abs 10.5 (*)    Lymphs Abs 0.4 (*)    All other components within normal limits  HEPATIC FUNCTION PANEL - Abnormal; Notable for the following:    Total Protein 5.6 (*)    Albumin 2.8 (*)    AST 94 (*)    ALT 66 (*)    Bilirubin, Direct <0.1 (*)    All other components within normal limits  TROPONIN I - Abnormal; Notable for the following:    Troponin I 0.16 (*)    All other components within normal limits  TROPONIN I - Abnormal; Notable for the following:    Troponin I 0.62 (*)    All other components within normal limits  TROPONIN I - Abnormal; Notable for  the following:    Troponin I 0.64 (*)    All other components within normal limits  LACTIC ACID, PLASMA - Abnormal; Notable for the following:    Lactic Acid, Venous 2.4 (*)    All other components within normal limits  TROPONIN I - Abnormal; Notable for the following:    Troponin I 0.43 (*)    All other components within normal limits  CBC - Abnormal; Notable for the following:    WBC 22.8 (*)    RBC 3.49 (*)    Hemoglobin 10.2 (*)    HCT 31.8 (*)    RDW 15.7 (*)    All other components within normal limits  BASIC METABOLIC PANEL - Abnormal; Notable for the following:    Potassium 3.4 (*)    Chloride 114 (*)    CO2 18 (*)    Glucose, Bld 132 (*)    BUN 26 (*)    Creatinine, Ser 1.39 (*)    Calcium 7.5 (*)    GFR calc non Af Amer 34 (*)    GFR calc Af Amer 39 (*)    All other components within normal limits  BRAIN NATRIURETIC PEPTIDE - Abnormal; Notable for the following:    B Natriuretic Peptide 1,009.7 (*)    All other components within normal limits  CBC - Abnormal; Notable for the  following:    WBC 14.7 (*)    RBC 3.27 (*)    Hemoglobin 9.5 (*)    HCT 29.9 (*)    RDW 15.9 (*)    Platelets 136 (*)    All other components within normal limits  BASIC METABOLIC PANEL - Abnormal; Notable for the following:    Potassium 3.3 (*)    Chloride 115 (*)    CO2 21 (*)    BUN 26 (*)    Creatinine, Ser 1.16 (*)    Calcium 7.2 (*)    GFR calc non Af Amer 42 (*)    GFR calc Af Amer 49 (*)    Anion gap 4 (*)    All other components within normal limits  MAGNESIUM - Abnormal; Notable for the following:    Magnesium 1.3 (*)    All other components within normal limits  CBC - Abnormal; Notable for the following:    RBC 3.35 (*)    Hemoglobin 9.4 (*)    HCT 30.6 (*)    Platelets 134 (*)    All other components within normal limits  BASIC METABOLIC PANEL - Abnormal; Notable for the following:    Glucose, Bld 107 (*)    BUN 31 (*)    Creatinine, Ser 1.50 (*)    Calcium 7.5 (*)    GFR calc non Af Amer 31 (*)    GFR calc Af Amer 36 (*)    All other components within normal limits  CBC - Abnormal; Notable for the following:    RBC 3.46 (*)    Hemoglobin 9.7 (*)    HCT 31.2 (*)    Platelets 143 (*)    All other components within normal limits  BASIC METABOLIC PANEL - Abnormal; Notable for the following:    BUN 29 (*)    Creatinine, Ser 1.54 (*)    Calcium 7.6 (*)    GFR calc non Af Amer 30 (*)    GFR calc Af Amer 35 (*)    All other components within normal limits  BASIC METABOLIC PANEL - Abnormal; Notable for the following:  Glucose, Bld 100 (*)    BUN 34 (*)    Creatinine, Ser 1.63 (*)    Calcium 8.3 (*)    GFR calc non Af Amer 28 (*)    GFR calc Af Amer 32 (*)    All other components within normal limits  CBC WITH DIFFERENTIAL/PLATELET - Abnormal; Notable for the following:    WBC 11.6 (*)    RBC 3.62 (*)    Hemoglobin 10.1 (*)    HCT 32.5 (*)    Neutro Abs 8.2 (*)    All other components within normal limits  HEPATIC FUNCTION PANEL - Abnormal; Notable  for the following:    Total Protein 5.8 (*)    Albumin 2.7 (*)    All other components within normal limits  CULTURE, BLOOD (ROUTINE X 2)  CULTURE, BLOOD (ROUTINE X 2)  MRSA PCR SCREENING  PROCALCITONIN  STREP PNEUMONIAE URINARY ANTIGEN  TSH  T4, FREE  LACTIC ACID, PLASMA  MAGNESIUM  MAGNESIUM  PHOSPHORUS    EKG  EKG Interpretation  Date/Time:  Thursday Nov 13 2016 16:08:21 EDT Ventricular Rate:  115 PR Interval:    QRS Duration: 88 QT Interval:  360 QTC Calculation: 498 R Axis:   10 Text Interpretation:  Sinus tachycardia Borderline ST depression, lateral leads Borderline prolonged QT interval Confirmed by Wilson Singer  MD, Wilferd Ritson ([id number]) on 11/13/2016 4:41:00 PM       Radiology No results found.   Dg Chest 2 View  Result Date: 11/13/2016 CLINICAL DATA:  83 year old female with fever, nonproductive cough, wheezing and shortness of breath. EXAM: CHEST  2 VIEW COMPARISON:  09/15/2016 and earlier. FINDINGS: AP and lateral views of the chest. Chronic hiatal hernia. Stable cardiac size and mediastinal contours. Calcified aortic atherosclerosis. Stable to mildly improved lung volumes. No pneumothorax. No pulmonary edema or pleural effusion. No consolidation. No convincing acute pulmonary opacity. Extensive chronic thoracolumbar spinal hardware appears stable. Negative visible bowel gas pattern. Stable visualized osseous structures. IMPRESSION: No acute cardiopulmonary abnormality. Electronically Signed   By: Genevie Ann M.D.   On: 11/13/2016 16:53    Procedures Procedures (including critical care time)  Medications Ordered in ED Medications  albuterol (PROVENTIL,VENTOLIN) solution continuous neb (not administered)  0.9 %  sodium chloride infusion (not administered)     Initial Impression / Assessment and Plan / ED Course  I have reviewed the triage vital signs and the nursing notes.  Pertinent labs & imaging results that were available during my care of the patient were reviewed  by me and considered in my medical decision making (see chart for details).       Final Clinical Impressions(s) / ED Diagnoses   Final diagnoses:  Community acquired pneumonia, unspecified laterality  SIRS (systemic inflammatory response syndrome) ([place])    New Prescriptions New Prescriptions   No medications on file     Virgel Manifold, MD 11/22/16 [phone]

## 2016-11-13 NOTE — ED Notes (Signed)
Patient was placed on bed pan.  She had a bowel movement with some urine mixed in.  Patient is aware we need urine sample.

## 2016-11-13 NOTE — ED Triage Notes (Signed)
Patient is from [facility].  Reported patient has a fever x 1 day with non-productive cough.  EMS states patient has some wheezing.    EMS administered 10 mg of Albuterol  .5 mg Atrovent 125 mg of Solu-Medrol   BP: 156/79 HR:110 R:20 O2: 92% on room air; after medication it was 98% on room air  T: 100 orally  CBG: 120

## 2016-11-13 NOTE — H&P (Signed)
History and Physical    [name] JKK:[id number] DOB: Dec 25, 1929 DOA: 11/13/2016  PCP: [name], MD Consultants:  None Patient coming from: [place]: son, [phone]  Chief Complaint: cough  HPI: [name] is a 83 y.o. female with medical history significant of hypertrophic cardiomyopathy, chronic diastolic heart failure, CAD, hyperlipidemia, and essential hypertension, rheumatoid arthritis on chronic prednisone and hypothyroidism presenting with cough yesterday, worse all night.  Unable to eat.  Low-grade fever today with wheezing.  PA was at the facility and was concerned that she had congestion.  Uncertain how high the fever was, maybe 100.  +SOB.  Non-productive cough.    She was hospitalized from 3/19-22 with sepsis due to UTI and left wrist celluliitis.   ED Course:  Sepsis protocol, given Vanc/Zosyn and 3L IVF  Review of Systems: As per HPI; otherwise review of systems reviewed and negative.   Ambulatory Status:  bedbound  Past Medical History:  Diagnosis Date  . Anxiety   . Arthritis   . Chronic diastolic CHF (congestive heart failure) ([place])    a. 10/2014 Echo: EF 60-65%, mild LVH, grade 1 DD, dynamic obstruction @ rest - peak velocity of 271 cm/sec, peak grad of 17mmHg, PASP 39mmHg.  Marland Kitchen Coronary artery disease    a. 2012 s/p NSTEMI/Cath: 95% apical LAD dzs, otw nonobs dzs-->Med Rx;  b. 05/2011 MV: no ischemia, EF 79%, inf infarct- attenuation.  . CVA (cerebral vascular accident) ([place])   . Depression   . DVT of lower extremity (deep venous thrombosis) ([place])   . Dyspnea   . Elevated troponin    a. 10/2014 in setting of sepsis/?HCAP.  Marland Kitchen Enteritis due to Clostridium difficile 01/04/2013  . Essential hypertension   . Fall   . Fatigue   . GERD (gastroesophageal reflux disease)   . GI bleed 2005  . HCAP (healthcare-associated pneumonia) 01/22/2013  . Hemorrhoids   . Hiatal hernia   . Hyperlipidemia   . Hypothyroidism   . Malnutrition ([place])   .  Obstructive hypertrophic cardiomyopathy ([place])    a. 10/2014 Echo: EF 60-65%, mild LVH, grade 1 DD, dynamic obstruction @ rest - peak velocity of 271 cm/sec, peak grad of 79mmHg, PASP 31mmHg.  . Osteoporosis   . PUD (peptic ulcer disease)   . RA (rheumatoid arthritis) ([place])   . Recurrent colitis due to Clostridium difficile 06/02/2012  . Renal insufficiency   . Right ear pain   . Sepsis ([place]) 12/19/2012   UTIs  . TIA (transient ischemic attack)   . Weight loss     Past Surgical History:  Procedure Laterality Date  . ANKLE SURGERY    . BACK SURGERY    . [place]   x3  . CARDIAC CATHETERIZATION     SHOWED RUPTURE PLAQUE IN THE LAD. THE LAD IS NONOBSTRUCTIVE WITH ONLY 30-40% STENOSIS  . COLONOSCOPY    . KNEE SURGERY    . NSTEMI  06/2010  . SPINAL FUSION SURGERY      Social History   Social History  . Marital status: Widowed    Spouse name: N/A  . Number of children: 3  . Years of education: N/A   Occupational History  . retired Orthoptist    Social History Main Topics  . Smoking status: Never Smoker  . Smokeless tobacco: Never Used  . Alcohol use No  . Drug use: No  . Sexual activity: No   Other Topics Concern  . Not on file  Social History Narrative   Patient used to work at [employer] for 25 years until she retired   Widowed   She lives by herself in [place] until she had a stroke earlier in 2013 June. Admitted to [facility] 12/09/11   Her next of kin is her daughter Estill Batten, son [name], son [name]   She does get physical therapy was everyday at [facility]   Caffeine use: coffee in the am   Never smoked   Alcohol none   Full Code       Allergies  Allergen Reactions  . Codeine Swelling  . Sulfa Antibiotics Other (See Comments)    GI upset  . Biphosphate Other (See Comments)  . Morphine And Related Other (See Comments)  . Percocet [Oxycodone-Acetaminophen] Other (See Comments)  . Plavix [Clopidogrel Bisulfate] Itching and Rash    Family  History  Problem Relation Age of Onset  . Kidney disease Mother   . Kidney disease Brother   . Lung cancer Father     Prior to Admission medications   Medication Sig Start Date End Date Taking? Authorizing Provider  apixaban (ELIQUIS) 5 MG TABS tablet Take 5 mg by mouth 2 (two) times daily. For DVT   Yes [provider]  Artificial Saliva (ACT DRY MOUTH) LOZG Use as directed in the mouth or throat as needed (for dry mouth).   Yes [provider]  calcium-vitamin D (OSCAL WITH D) 500-200 MG-UNIT per tablet Take 1 tablet by mouth 3 (three) times daily.   Yes [provider]  carboxymethylcellulose 1 % ophthalmic solution Apply 1 drop to eye 3 (three) times daily. Both eyes   Yes [provider]  cetirizine (ZYRTEC) 10 MG tablet Take 10 mg by mouth daily.    Yes [provider]  Cholecalciferol (VITAMIN D) 2000 UNITS CAPS Take 1 capsule (2,000 Units total) by mouth daily. 11/21/13  Yes [name], NP  Cranberry 200 MG CAPS Take 2 capsules by mouth 2 (two) times daily.    Yes [provider]  denosumab (PROLIA) 60 MG/ML SOLN injection Inject 60 mg into the skin every 6 (six) months. Administer in upper arm, thigh, or abdomen   Yes [provider]  esomeprazole (NEXIUM) 40 MG capsule Take 40 mg by mouth 2 (two) times daily before a meal.    Yes [provider]  ferrous sulfate 325 (65 FE) MG tablet Take 325 mg by mouth daily.    Yes [provider]  guaiFENesin (ROBITUSSIN) 100 MG/5ML liquid Take 200 mg by mouth 4 (four) times daily as needed for cough.   Yes [provider]  hydrochlorothiazide (HYDRODIURIL) 12.5 MG tablet Take 1 tablet (12.5 mg total) by mouth daily. 01/14/16  Yes Ngetich, Dinah C, NP  HYDROcodone-acetaminophen (NORCO/VICODIN) 5-325 MG tablet Take 1 tablet by mouth daily.   Yes [provider]  ipratropium (ATROVENT) 0.03 % nasal spray Place 2 sprays into both nostrils daily as  needed (For asthma).    Yes [provider]  ipratropium-albuterol (DUONEB) 0.5-2.5 (3) MG/3ML SOLN Take 3 mLs by nebulization every 8 (eight) hours as needed (wheezing).    Yes [provider]  levothyroxine (SYNTHROID, LEVOTHROID) 25 MCG tablet Take 1 tablet (25 mcg total) by mouth daily before breakfast. 09/19/16  Yes Rosita Fire, MD  lidocaine (LIDODERM) 5 % Place 1 patch onto the skin daily. Remove & Discard patch within 12 hours or as directed by MD 04/04/15  Yes Sherrie Mustache  K, NP  LORazepam (ATIVAN) 0.5 MG tablet Take one tablet by mouth every night at bedtime for anxiety 07/24/16  Yes Reed, Tiffany L, DO  meclizine (ANTIVERT) 12.5 MG tablet Take 12.5 mg by mouth 2 (two) times daily.    Yes [provider]  Menthol, Topical Analgesic, (BIOFREEZE) 4 % GEL Apply 1 application topically 2 (two) times daily. For wrist pain    Yes [provider]  metoprolol (LOPRESSOR) 50 MG tablet Take 50 mg by mouth 2 (two) times daily.    Yes [provider]  montelukast (SINGULAIR) 10 MG tablet Take 10 mg by mouth at bedtime.    Yes [provider]  Multiple Vitamins-Minerals (CERTAVITE/ANTIOXIDANTS PO) Take 1 tablet by mouth daily.    Yes [provider]  nitrofurantoin, macrocrystal-monohydrate, (MACROBID) 100 MG capsule Take 100 mg by mouth daily.    Yes [provider]  nitroGLYCERIN (NITROSTAT) 0.4 MG SL tablet Place 0.4 mg under the tongue every 5 (five) minutes as needed for chest pain.   Yes [provider]  ondansetron (ZOFRAN) 4 MG tablet Take 4 mg by mouth every 6 (six) hours as needed for nausea or vomiting.   Yes [provider]  OXYGEN Inhale 2 L into the lungs as needed (To maintain saturations >90%).    Yes [provider]  PATADAY 0.2 % SOLN Place 1 drop into both eyes daily.  03/26/15  Yes [provider]  potassium chloride SA (K-DUR,KLOR-CON) 20 MEQ tablet Take 1 tablet (20  mEq total) by mouth daily. 09/19/16  Yes Rosita Fire, MD  predniSONE (DELTASONE) 10 MG tablet Take 10 mg by mouth daily. For RA 03/08/15  Yes [provider]  rosuvastatin (CRESTOR) 20 MG tablet Take 20 mg by mouth every evening. For hyperlipidemia 07/05/13  Yes [name], NP  saccharomyces boulardii (FLORASTOR) 250 MG capsule Take 1 capsule (250 mg total) by mouth 2 (two) times daily. 11/04/16 11/14/16 Yes Ngetich, Dinah C, NP  sertraline (ZOLOFT) 50 MG tablet Take 50 mg by mouth every morning. For depression   Yes [provider]    Physical Exam: Vitals:   11/13/16 1835 11/13/16 2053 11/13/16 2227 11/13/16 2320  BP: 123/65 120/64 138/72 134/76  Pulse: (!) 127 (!) 117 (!) 111 (!) 116  Resp: (!) 21 14 (!) 26 17  Temp:   99 F (37.2 C) 98.4 F (36.9 C)  TempSrc:   Oral Oral  SpO2: 94% 94% 95% 98%  Weight:    59.3 kg (130 lb 11.7 oz)  Height:    5\' 6"  (1.676 m)     General:  Appears calm and comfortable and is NAD.  Appears chronically ill, very difficult to understand, poor historian. Eyes:  PERRL, EOMI, normal lids, iris ENT:  grossly normal hearing, lips & tongue, mmm Neck:  no LAD, masses or thyromegaly Cardiovascular:  Tachycardia, no m/r/g. No LE edema.  Respiratory:  RML rhonchi with scattered expiratory wheezes.  Normal respiratory effort. Abdomen:  soft, ntnd, NABS Skin:  no rash or induration seen on limited exam Musculoskeletal:  grossly normal tone BUE/BLE, good ROM, no bony abnormality Psychiatric:  grossly normal mood and affect, speech fluent and appropriate, AOx3 Neurologic:  CN 2-12 grossly intact, moves all extremities in coordinated fashion, sensation intact  Labs on Admission: I have personally reviewed following labs and imaging studies  CBC:  Recent Labs Lab 11/13/16 1655  WBC 12.8*  NEUTROABS 10.8*  HGB 11.7*  HCT 37.5  MCV 91.5  PLT 528   Basic Metabolic Panel:  Recent Labs Lab 11/13/16 1655  NA 141  K 3.6  CL  105  CO2 24  GLUCOSE 189*  BUN 29*  CREATININE 1.59*  CALCIUM 8.9   GFR: Estimated Creatinine Clearance: 24.7 mL/min (A) (by C-G formula based on SCr of 1.59 mg/dL (H)). Liver Function Tests: No results for input(s): AST, ALT, ALKPHOS, BILITOT, PROT, ALBUMIN in the last 168 hours. No results for input(s): LIPASE, AMYLASE in the last 168 hours. No results for input(s): AMMONIA in the last 168 hours. Coagulation Profile: No results for input(s): INR, PROTIME in the last 168 hours. Cardiac Enzymes: No results for input(s): CKTOTAL, CKMB, CKMBINDEX, TROPONINI in the last 168 hours. BNP (last 3 results) No results for input(s): PROBNP in the last 8760 hours. HbA1C: No results for input(s): HGBA1C in the last 72 hours. CBG: No results for input(s): GLUCAP in the last 168 hours. Lipid Profile: No results for input(s): CHOL, HDL, LDLCALC, TRIG, CHOLHDL, LDLDIRECT in the last 72 hours. Thyroid Function Tests: No results for input(s): TSH, T4TOTAL, FREET4, T3FREE, THYROIDAB in the last 72 hours. Anemia Panel: No results for input(s): VITAMINB12, FOLATE, FERRITIN, TIBC, IRON, RETICCTPCT in the last 72 hours. Urine analysis:    Component Value Date/Time   COLORURINE YELLOW 11/13/2016 2010   APPEARANCEUR CLEAR 11/13/2016 2010   APPEARANCEUR Hazy 03/06/2013 1013   LABSPEC 1.018 11/13/2016 2010   LABSPEC 1.013 03/06/2013 1013   PHURINE 5.0 11/13/2016 2010   GLUCOSEU 150 (A) 11/13/2016 2010   GLUCOSEU Negative 03/06/2013 [place] 11/13/2016 2010   BILIRUBINUR NEGATIVE 11/13/2016 2010   BILIRUBINUR Negative 03/06/2013 [place] 11/13/2016 2010   PROTEINUR 30 (A) 11/13/2016 2010   UROBILINOGEN 0.2 12/18/2012 1203   NITRITE NEGATIVE 11/13/2016 2010   LEUKOCYTESUR NEGATIVE 11/13/2016 2010   LEUKOCYTESUR 3+ 03/06/2013 1013    Creatinine Clearance: Estimated Creatinine Clearance: 24.7 mL/min (A) (by C-G formula based on SCr of 1.59 mg/dL (H)).  Sepsis  Labs: @LABRCNTIP (procalcitonin:4,lacticidven:4) )No results found for this or any previous visit (from the past 240 hour(s)).   Radiological Exams on Admission: Dg Chest 2 View  Result Date: 11/13/2016 CLINICAL DATA:  83 year old female with fever, nonproductive cough, wheezing and shortness of breath. EXAM: CHEST  2 VIEW COMPARISON:  09/15/2016 and earlier. FINDINGS: AP and lateral views of the chest. Chronic hiatal hernia. Stable cardiac size and mediastinal contours. Calcified aortic atherosclerosis. Stable to mildly improved lung volumes. No pneumothorax. No pulmonary edema or pleural effusion. No consolidation. No convincing acute pulmonary opacity. Extensive chronic thoracolumbar spinal hardware appears stable. Negative visible bowel gas pattern. Stable visualized osseous structures. IMPRESSION: No acute cardiopulmonary abnormality. Electronically Signed   By: Genevie Ann M.D.   On: 11/13/2016 16:53    EKG: Independently reviewed.  Sinus tachycardia with rate 115; likely rate-related ST changes with no evidence of acute ischemia  Assessment/Plan Principal Problem:   Sepsis ([place]) Active Problems:   RA (rheumatoid arthritis) ([facility])   Hypothyroidism   HCAP (healthcare-associated pneumonia)   CKD (chronic kidney disease) stage 3, GFR 30-59 ml/min   History of DVT (deep vein thrombosis)   Hyperglycemia   Sepsis, likely due to HCAP -Elevated WBC count (12.8), ?fever, tachycardia, and tachypnea with elevated lactate to 3.3, repeat 7.7 -While awaiting blood cultures, this appears to be a preseptic condition. -Sepsis protocol initiated; IVF bolus ordered -Negative UA (but she has been taking Levaquin) -Given productive cough, ?fever, mildly  decreased oxygen saturation, and rhonchi in right middle lobe on exam , most likely pneumonia is the source of her sepsis physiology.  -Since she resides in a SNF, this would be considered HCAP. -CURB-65 score is 2 - will admit the patient to telemetry; she  has an estimated mortality risk of 9.2%. -The patient will need treatment for HCAP due to MDR risk factors as above; will treat with Cefepime and Vancomycin. -NS @ 75cc/hr -Fever control -Repeat CBC in am -Sputum cultures -Blood cultures -Strep pneumo testing -Will order procalcitonin level.  >0.5 indicates infection and >>0.5 indicates more serious disease.  As the procalcitonin level normalizes, it will be reasonable to consider de-escalation of antibiotic coverage. -albuterol PRN -Will trend lactate to ensure improvement  Hyperglycemia -Glucose 189 -May be stress response -A1c was 6.4 in 7/17 -She does take chronic steroids which likely contributes -Will follow with fasting AM labs -It is unlikely that he will need acute or chronic treatment for this issue  CKD -BUN 29/Creatinine 1.59/GFR 29 - appears to be at/near baseline -Will follow  Hypothyroidism -Low TSH in 3/18 -Possibly oversuppressed with Synthroid -Will check TSH and free T4 -Continue home dose of Synthroid for now  RA -Immunocompromised due to daily prednisone  H/o DVT -It is not clear when this occurred or whether patient truly needs lifelong anticoagulation -Continue Eliquis for now  DVT prophylaxis: Eliquis Code Status:  Full - confirmed with patient/family Family Communication: Son present throughout evaluation  Disposition Plan:  Back to SNF once clinically improved Consults called: None Admission status: Admit - It is my clinical opinion that admission to [place] is reasonable and necessary because this patient will require at least 2 midnights in the hospital to treat this condition based on the medical complexity of the problems presented.  Given the aforementioned information, the predictability of an adverse outcome is felt to be significant.    Karmen Bongo MD [facility]  If 7PM-7AM, please contact night-coverage [email or web address] Password [email or web address]  11/14/2016, 12:00 AM

## 2016-11-13 NOTE — Clinical Social Work Note (Signed)
Clinical Social Work Assessment  Patient Details  Name: [name] MRN: [id number] Date of Birth: Sep 22, 1929  Date of referral:  11/13/16               Reason for consult:  Facility Placement                Permission sought to share information with:  Family Supports Permission granted to share information::  Yes, Verbal Permission Granted  Name::        Agency::     Relationship::     Contact Information:     Housing/Transportation Living arrangements for the past 2 months:  [place] of Information:  Patient Patient Interpreter Needed:  None Criminal Activity/Legal Involvement Pertinent to Current Situation/Hospitalization:    Significant Relationships:  Adult Children Lives with:  Facility Resident Do you feel safe going back to the place where you live?  Yes Need for family participation in patient care:  No (Coment)  Care giving concerns:  None listed by pt/family    Social Worker assessment / plan:  CSW met with pt and confirmed pt's plan to be discharged back to [facility] to live at discharge.  CSW provided active listening and validated pt's concerns.   Pt gave CSW Dept permission to facilitate and complete referrals to return patient back to [facility] facility where the pt is a permanent resident,via the hub per pt's request.  Pt has been living at [facility] facility where the pt is a permanent resident, prior to being admitted to [facility].   Employment status:  Retired Forensic scientist:  Information systems manager, Medicaid In [place] PT Recommendations:  Not assessed at this time Information / Referral to community resources:     Patient/Family's Response to care:  Patient alert and oriented.  Patient and agreeable to plan.  Pt's children supportive and strongly involved in pt.'s care, per the pt.  Pt pleasant and appreciated CSW intervention.    Patient/Family's Understanding of and Emotional Response to Diagnosis, Current Treatment, and  Prognosis:  Still assessing  Emotional Assessment Appearance:  Appears younger than stated age Attitude/Demeanor/Rapport:  Apprehensive Affect (typically observed):  Accepting, Anxious, Apprehensive, Appropriate Orientation:  Oriented to Self, Fluctuating Orientation (Suspected and/or reported Sundowners) Alcohol / Substance use:    Psych involvement (Current and /or in the community):     Discharge Needs  Concerns to be addressed:  No discharge needs identified Readmission within the last 30 days:  No Current discharge risk:  None Barriers to Discharge:  No Barriers Identified   Claudine Mouton, LCSWA 11/13/2016, 10:55 PM

## 2016-11-13 NOTE — ED Notes (Signed)
Patient refused rectal temperature 

## 2016-11-14 ENCOUNTER — Inpatient Hospital Stay (HOSPITAL_COMMUNITY): Payer: Medicare Other

## 2016-11-14 DIAGNOSIS — A419 Sepsis, unspecified organism: Principal | ICD-10-CM

## 2016-11-14 DIAGNOSIS — J189 Pneumonia, unspecified organism: Secondary | ICD-10-CM

## 2016-11-14 DIAGNOSIS — M069 Rheumatoid arthritis, unspecified: Secondary | ICD-10-CM

## 2016-11-14 DIAGNOSIS — R739 Hyperglycemia, unspecified: Secondary | ICD-10-CM | POA: Diagnosis present

## 2016-11-14 DIAGNOSIS — I360 Nonrheumatic tricuspid (valve) stenosis: Secondary | ICD-10-CM

## 2016-11-14 LAB — CBC WITH DIFFERENTIAL/PLATELET
BASOS PCT: 0 %
Basophils Absolute: 0 10*3/uL (ref 0.0–0.1)
EOS ABS: 0 10*3/uL (ref 0.0–0.7)
Eosinophils Relative: 0 %
HEMATOCRIT: 31.2 % — AB (ref 36.0–46.0)
Hemoglobin: 10 g/dL — ABNORMAL LOW (ref 12.0–15.0)
Lymphocytes Relative: 4 %
Lymphs Abs: 0.4 10*3/uL — ABNORMAL LOW (ref 0.7–4.0)
MCH: 29.4 pg (ref 26.0–34.0)
MCHC: 32.1 g/dL (ref 30.0–36.0)
MCV: 91.8 fL (ref 78.0–100.0)
MONO ABS: 0.6 10*3/uL (ref 0.1–1.0)
MONOS PCT: 6 %
Neutro Abs: 10.5 10*3/uL — ABNORMAL HIGH (ref 1.7–7.7)
Neutrophils Relative %: 90 %
Platelets: 147 10*3/uL — ABNORMAL LOW (ref 150–400)
RBC: 3.4 MIL/uL — ABNORMAL LOW (ref 3.87–5.11)
RDW: 15.3 % (ref 11.5–15.5)
WBC: 11.6 10*3/uL — ABNORMAL HIGH (ref 4.0–10.5)

## 2016-11-14 LAB — ECHOCARDIOGRAM COMPLETE
Height: 66 in
WEIGHTICAEL: 2091.72 [oz_av]

## 2016-11-14 LAB — MRSA PCR SCREENING: MRSA BY PCR: NEGATIVE

## 2016-11-14 LAB — STREP PNEUMONIAE URINARY ANTIGEN: Strep Pneumo Urinary Antigen: NEGATIVE

## 2016-11-14 LAB — BASIC METABOLIC PANEL
Anion gap: 8 (ref 5–15)
BUN: 26 mg/dL — AB (ref 6–20)
CALCIUM: 7.4 mg/dL — AB (ref 8.9–10.3)
CO2: 19 mmol/L — AB (ref 22–32)
Chloride: 114 mmol/L — ABNORMAL HIGH (ref 101–111)
Creatinine, Ser: 1.34 mg/dL — ABNORMAL HIGH (ref 0.44–1.00)
GFR calc Af Amer: 41 mL/min — ABNORMAL LOW (ref >60)
GFR calc non Af Amer: 35 mL/min — ABNORMAL LOW (ref >60)
GLUCOSE: 170 mg/dL — AB (ref 65–99)
Potassium: 4 mmol/L (ref 3.5–5.1)
Sodium: 141 mmol/L (ref 135–145)

## 2016-11-14 LAB — PROCALCITONIN: PROCALCITONIN: 0.18 ng/mL

## 2016-11-14 LAB — LACTIC ACID, PLASMA
Lactic Acid, Venous: 1.6 mmol/L (ref 0.5–1.9)
Lactic Acid, Venous: 2.4 mmol/L (ref 0.5–1.9)
Lactic Acid, Venous: 5.9 mmol/L (ref 0.5–1.9)
Lactic Acid, Venous: 8.7 mmol/L (ref 0.5–1.9)

## 2016-11-14 LAB — HEPATIC FUNCTION PANEL
ALT: 66 U/L — ABNORMAL HIGH (ref 14–54)
AST: 94 U/L — AB (ref 15–41)
Albumin: 2.8 g/dL — ABNORMAL LOW (ref 3.5–5.0)
Alkaline Phosphatase: 48 U/L (ref 38–126)
BILIRUBIN TOTAL: 0.4 mg/dL (ref 0.3–1.2)
Total Protein: 5.6 g/dL — ABNORMAL LOW (ref 6.5–8.1)

## 2016-11-14 LAB — T4, FREE: Free T4: 0.7 ng/dL (ref 0.61–1.12)

## 2016-11-14 LAB — PROTIME-INR
INR: 1.33
PROTHROMBIN TIME: 16.6 s — AB (ref 11.4–15.2)

## 2016-11-14 LAB — TSH: TSH: 0.353 u[IU]/mL (ref 0.350–4.500)

## 2016-11-14 LAB — TROPONIN I
TROPONIN I: 0.62 ng/mL — AB (ref <0.03)
Troponin I: 0.16 ng/mL (ref <0.03)
Troponin I: 0.64 ng/mL (ref <0.03)

## 2016-11-14 LAB — APTT: aPTT: 20 seconds — ABNORMAL LOW (ref 24–36)

## 2016-11-14 MED ORDER — IPRATROPIUM-ALBUTEROL 0.5-2.5 (3) MG/3ML IN SOLN
3.0000 mL | Freq: Three times a day (TID) | RESPIRATORY_TRACT | Status: DC
  Administered 2016-11-15 (×2): 3 mL via RESPIRATORY_TRACT
  Filled 2016-11-14 (×3): qty 3

## 2016-11-14 MED ORDER — APIXABAN 2.5 MG PO TABS
2.5000 mg | ORAL_TABLET | Freq: Two times a day (BID) | ORAL | Status: DC
  Administered 2016-11-14 – 2016-11-19 (×10): 2.5 mg via ORAL
  Filled 2016-11-14 (×10): qty 1

## 2016-11-14 MED ORDER — IPRATROPIUM BROMIDE 0.02 % IN SOLN
0.5000 mg | RESPIRATORY_TRACT | Status: DC | PRN
  Administered 2016-11-15: 0.5 mg via RESPIRATORY_TRACT
  Filled 2016-11-14: qty 2.5

## 2016-11-14 MED ORDER — ALBUTEROL SULFATE (2.5 MG/3ML) 0.083% IN NEBU
2.5000 mg | INHALATION_SOLUTION | RESPIRATORY_TRACT | Status: DC | PRN
  Administered 2016-11-14 – 2016-11-15 (×2): 2.5 mg via RESPIRATORY_TRACT
  Filled 2016-11-14: qty 3

## 2016-11-14 MED ORDER — ALBUTEROL SULFATE (2.5 MG/3ML) 0.083% IN NEBU
INHALATION_SOLUTION | RESPIRATORY_TRACT | Status: AC
  Administered 2016-11-14: 2.5 mg via RESPIRATORY_TRACT
  Filled 2016-11-14: qty 3

## 2016-11-14 MED ORDER — VANCOMYCIN HCL 500 MG IV SOLR: 500.0000 mg | INTRAVENOUS | Status: DC

## 2016-11-14 MED ORDER — HYDRALAZINE HCL 20 MG/ML IJ SOLN
10.0000 mg | Freq: Four times a day (QID) | INTRAMUSCULAR | Status: DC | PRN
  Administered 2016-11-14 – 2016-11-16 (×3): 10 mg via INTRAVENOUS
  Filled 2016-11-14 (×4): qty 1

## 2016-11-14 MED ORDER — DEXTROSE 5 % IV SOLN
1.0000 g | INTRAVENOUS | Status: DC
  Administered 2016-11-14 – 2016-11-17 (×4): 1 g via INTRAVENOUS
  Filled 2016-11-14 (×6): qty 1

## 2016-11-14 MED ORDER — ALBUTEROL SULFATE (2.5 MG/3ML) 0.083% IN NEBU: 2.5000 mg | INHALATION_SOLUTION | RESPIRATORY_TRACT | Status: DC | PRN

## 2016-11-14 NOTE — Progress Notes (Signed)
PROGRESS NOTE    [name]  AYT:[id number] DOB: 1930-03-03 DOA: 11/13/2016 PCP: [name], MD   Chief Complaint  Patient presents with  . Fever  . Shortness of Breath    Brief Narrative:  HPI on 11/13/2016 by Dr. Karmen Bongo [name] is a 83 y.o. female with medical history significant of hypertrophic cardiomyopathy, chronic diastolic heart failure, CAD, hyperlipidemia, and essential hypertension, rheumatoid arthritis on chronic prednisone and hypothyroidism presenting with cough yesterday, worse all night.  Unable to eat.  Low-grade fever today with wheezing.  PA was at the facility and was concerned that she had congestion.  Uncertain how high the fever was, maybe 100.  +SOB.  Non-productive cough.   She was hospitalized from 3/19-22 with sepsis due to UTI and left wrist celluliitis.  Assessment & Plan   Sepsis secondary to possible viral illness -Presented with Tachycardia, tachypnea, leukocytosis -Blood cultures show no growth to date -Chest x-Samantha and UA unremarkable for infection -Patient started on vancomycin and cefepime -Discontinued vancomycin today as patient had negative MRSA screen -sepsis morphology appears to be improving -Strep pneumonia urine antigen negative -Continue ipratropium and supportive care  Lactic acidosis -Thought to be secondary to sepsis versus nebulizer treatments with albuterol -PCCM consulted and appreciated and has signed off -lactic acid peaked at 8.7, now 1.6  -was given IVF  Elevated troponin -peaked at 0.64 -Echocardiogram: Showed EF of 35-57%, grade 1 diastolic dysfunction, mild MR and MS, severe LAE, mild to moderate TR with moderately elevated pulm pressure -Likely secondary to sepsis/demand ischemia  Chronic kidney disease, stage III -Creatinine currently 1.34, and at baseline -Continue to monitor BMP  Rheumatoid arthritis -Continue prednisone  Hypothyroidism -Continue Synthroid -TSH 0.353 (WNL)  History of  DVT -Continue Eliqius, will reduce the dose to 2.5 mg twice a day  Hyperglycemia -Patient is on chronic steroids -last hemoglobin A1c 6.4 in July 2017 -CBGs appear controlled  DVT Prophylaxis  Eliquis  Code Status: Full  Family Communication: None at bedside  Disposition Plan: Admitted, continue to monitor in stepdown  Consultants PCCM  Procedures  Echocardiogram  Antibiotics   Anti-infectives    Start     Dose/Rate Route Frequency Ordered Stop   11/14/16 2200  ceFEPIme (MAXIPIME) 1 g in dextrose 5 % 50 mL IVPB     1 g 100 mL/hr over 30 Minutes Intravenous Every 24 hours 11/14/16 0144     11/14/16 1800  vancomycin (VANCOCIN) 500 mg in sodium chloride 0.9 % 100 mL IVPB  Status:  Discontinued     500 mg 100 mL/hr over 60 Minutes Intravenous Every 24 hours 11/14/16 0144 11/14/16 1015   11/13/16 2315  ceFEPIme (MAXIPIME) 2 g in dextrose 5 % 50 mL IVPB     2 g 100 mL/hr over 30 Minutes Intravenous  Once 11/13/16 2311 11/14/16 0137   11/13/16 2315  vancomycin (VANCOCIN) IVPB 1000 mg/200 mL premix  Status:  Discontinued     1,000 mg 200 mL/hr over 60 Minutes Intravenous  Once 11/13/16 2311 11/13/16 2317   11/13/16 1845  piperacillin-tazobactam (ZOSYN) IVPB 3.375 g     3.375 g 100 mL/hr over 30 Minutes Intravenous  Once 11/13/16 1833 11/13/16 1900   11/13/16 1815  piperacillin-tazobactam (ZOSYN) IVPB 3.375 g  Status:  Discontinued     3.375 g 100 mL/hr over 30 Minutes Intravenous  Once 11/13/16 1805 11/13/16 1833   11/13/16 1815  vancomycin (VANCOCIN) IVPB 1000 mg/200 mL premix     1,000 mg 200 mL/hr  over 60 Minutes Intravenous  Once 11/13/16 1805 11/13/16 2050      Subjective:   [name] seen and examined today.  Patient states she is feeling better. Feels breathing has improved since admission. Denies chest pain, abdominal pain, nausea, vomiting, diarrhea, constipation.   Objective:   Vitals:   11/14/16 0800 11/14/16 1000 11/14/16 1200 11/14/16 1227  BP: (!)  165/103 136/86  (!) 167/78  Pulse: 95 91  89  Resp: (!) 24 (!) 24  19  Temp: 99 F (37.2 C)  98.3 F (36.8 C)   TempSrc: Oral  Oral   SpO2: 93% 95%  93%  Weight:      Height:        Intake/Output Summary (Last 24 hours) at 11/14/16 1445 Last data filed at 11/14/16 0600  Gross per 24 hour  Intake          1378.75 ml  Output                0 ml  Net          1378.75 ml   Filed Weights   11/13/16 1548 11/13/16 2320  Weight: 56.7 kg (125 lb) 59.3 kg (130 lb 11.7 oz)    Exam  General: Well developed, well nourished, NAD, appears stated age  HEENT: NCAT, mucous membranes moist.   Cardiovascular: S1 S2 auscultated, no rubs, murmurs or gallops. Regular rate and rhythm.  Respiratory: Expiratory wheezing, dry cough  Abdomen: Soft, nontender, nondistended, + bowel sounds  Extremities: warm dry without cyanosis clubbing or edema  Neuro: AAOx3, nonfocal  Psych: Normal affect and demeanor with intact judgement and insight   Data Reviewed: I have personally reviewed following labs and imaging studies  CBC:  Recent Labs Lab 11/13/16 1655 11/14/16 0516  WBC 12.8* 11.6*  NEUTROABS 10.8* 10.5*  HGB 11.7* 10.0*  HCT 37.5 31.2*  MCV 91.5 91.8  PLT 173 595*   Basic Metabolic Panel:  Recent Labs Lab 11/13/16 1655 11/14/16 0516  NA 141 141  K 3.6 4.0  CL 105 114*  CO2 24 19*  GLUCOSE 189* 170*  BUN 29* 26*  CREATININE 1.59* 1.34*  CALCIUM 8.9 7.4*   GFR: Estimated Creatinine Clearance: 29.3 mL/min (A) (by C-G formula based on SCr of 1.34 mg/dL (H)). Liver Function Tests:  Recent Labs Lab 11/14/16 0203  AST 94*  ALT 66*  ALKPHOS 48  BILITOT 0.4  PROT 5.6*  ALBUMIN 2.8*   No results for input(s): LIPASE, AMYLASE in the last 168 hours. No results for input(s): AMMONIA in the last 168 hours. Coagulation Profile:  Recent Labs Lab 11/13/16 2336  INR 1.33   Cardiac Enzymes:  Recent Labs Lab 11/14/16 0203 11/14/16 0818  TROPONINI 0.16* 0.62*    BNP (last 3 results) No results for input(s): PROBNP in the last 8760 hours. HbA1C: No results for input(s): HGBA1C in the last 72 hours. CBG: No results for input(s): GLUCAP in the last 168 hours. Lipid Profile: No results for input(s): CHOL, HDL, LDLCALC, TRIG, CHOLHDL, LDLDIRECT in the last 72 hours. Thyroid Function Tests:  Recent Labs  11/14/16 0203  TSH 0.353  FREET4 0.70   Anemia Panel: No results for input(s): VITAMINB12, FOLATE, FERRITIN, TIBC, IRON, RETICCTPCT in the last 72 hours. Urine analysis:    Component Value Date/Time   COLORURINE YELLOW 11/13/2016 2010   APPEARANCEUR CLEAR 11/13/2016 2010   APPEARANCEUR Hazy 03/06/2013 1013   LABSPEC 1.018 11/13/2016 2010   LABSPEC 1.013 03/06/2013 1013  PHURINE 5.0 11/13/2016 2010   GLUCOSEU 150 (A) 11/13/2016 2010   GLUCOSEU Negative 03/06/2013 1013   HGBUR NEGATIVE 11/13/2016 2010   BILIRUBINUR NEGATIVE 11/13/2016 2010   BILIRUBINUR Negative 03/06/2013 [place] 11/13/2016 2010   PROTEINUR 30 (A) 11/13/2016 2010   UROBILINOGEN 0.2 12/18/2012 1203   NITRITE NEGATIVE 11/13/2016 2010   LEUKOCYTESUR NEGATIVE 11/13/2016 2010   LEUKOCYTESUR 3+ 03/06/2013 1013   Sepsis Labs: @LABRCNTIP (procalcitonin:4,lacticidven:4)  ) Recent Results (from the past 240 hour(s))  Culture, blood (x 2)     Status: None (Preliminary result)   Collection Time: 11/13/16 11:12 PM  Result Value Ref Range Status   Specimen Description BLOOD LEFT ANTECUBITAL  Final   Special Requests   Final    BOTTLES DRAWN AEROBIC AND ANAEROBIC Blood Culture adequate volume   Culture   Final    NO GROWTH < 12 HOURS Performed at [facility], [place] [address], [place], [place] [address]    Report Status PENDING  Incomplete  Culture, blood (x 2)     Status: None (Preliminary result)   Collection Time: 11/13/16 11:17 PM  Result Value Ref Range Status   Specimen Description BLOOD LEFT HAND  Final   Special Requests IN PEDIATRIC  BOTTLE Blood Culture adequate volume  Final   Culture   Final    NO GROWTH < 12 HOURS Performed at [facility], [place] [address] [place], [place] [address]    Report Status PENDING  Incomplete  MRSA PCR Screening     Status: None   Collection Time: 11/13/16 11:41 PM  Result Value Ref Range Status   MRSA by PCR NEGATIVE NEGATIVE Final    Comment:        The GeneXpert MRSA Assay (FDA approved for NASAL specimens only), is one component of a comprehensive MRSA colonization surveillance program. It is not intended to diagnose MRSA infection nor to guide or monitor treatment for MRSA infections.       Radiology Studies: Dg Chest 2 View  Result Date: 11/13/2016 CLINICAL DATA:  83 year old female with fever, nonproductive cough, wheezing and shortness of breath. EXAM: CHEST  2 VIEW COMPARISON:  09/15/2016 and earlier. FINDINGS: AP and lateral views of the chest. Chronic hiatal hernia. Stable cardiac size and mediastinal contours. Calcified aortic atherosclerosis. Stable to mildly improved lung volumes. No pneumothorax. No pulmonary edema or pleural effusion. No consolidation. No convincing acute pulmonary opacity. Extensive chronic thoracolumbar spinal hardware appears stable. Negative visible bowel gas pattern. Stable visualized osseous structures. IMPRESSION: No acute cardiopulmonary abnormality. Electronically Signed   By: Genevie Ann M.D.   On: 11/13/2016 16:53     Scheduled Meds: . apixaban  2.5 mg Oral BID  . HYDROcodone-acetaminophen  1 tablet Oral Daily  . levothyroxine  25 mcg Oral QAC breakfast  . loratadine  10 mg Oral Daily  . LORazepam  0.5 mg Oral QHS  . meclizine  12.5 mg Oral BID  . metoprolol tartrate  50 mg Oral BID  . montelukast  10 mg Oral QHS  . pantoprazole  40 mg Oral Daily  . polyvinyl alcohol  1 drop Both Eyes TID  . predniSONE  10 mg Oral QAC breakfast  . rosuvastatin  20 mg Oral QPM  . sertraline  50 mg Oral q morning - 10a   Continuous  Infusions: . sodium chloride 75 mL/hr at 11/14/16 0137  . ceFEPime (MAXIPIME) IV       LOS: 1 day  Time Spent in minutes   30 minutes  Kimo Bancroft D.O. on 11/14/2016 at 2:45 PM  Between 7am to 7pm - Pager - [phone]  After 7pm go to [email or web address] - password [email or web address]  And look for the night coverage person covering for me after hours  [facility] Office  [phone]

## 2016-11-14 NOTE — Progress Notes (Signed)
CRITICAL VALUE ALERT  Critical value received:  Lactic acid 8.7  Date of notification:  5/18  Time of notification:  0037  Critical value read back:Yes.    Nurse who received alert:  Patrick,RN  MD notified (1st page):  Dr. Lorin Mercy  Time of first page:  0113  MD notified (2nd page):  Time of second page:  Responding MD:  Dr. Lorin Mercy  Time MD responded:  [phone]

## 2016-11-14 NOTE — Care Management Note (Signed)
Case Management Note  Patient Details  Name: Noriah Osgood MRN: [id number] Date of Birth: 1930/04/30  Subjective/Objective:         Early sepsis with resp distress           Date:  Nov 14, 2016  Chart reviewed for concurrent status and case management needs.  Will continue to follow patient progress.  Discharge Planning: following for needs  Expected discharge date: [id number]  Velva Harman, BSN, [place], [place] Action/Plan:   Expected Discharge Date:   (unknown)               Expected Discharge Plan:  [place]  In-House Referral:  Clinical Social Work  Discharge planning Services  CM Consult  Post Acute Care Choice:    Choice offered to:     DME Arranged:    DME Agency:     HH Arranged:    [place] Agency:     Status of Service:  Completed, signed off  If discussed at [employer], dates discussed:    Additional Comments:  Leeroy Cha, RN 11/14/2016, 9:06 AM

## 2016-11-14 NOTE — Progress Notes (Signed)
  Echocardiogram 2D Echocardiogram has been performed.  Darlina Sicilian M 11/14/2016, 11:38 AM

## 2016-11-14 NOTE — Progress Notes (Signed)
CRITICAL VALUE ALERT  Critical value received:  Lactic acid 2.4  Date of notification:  11/14/16  Time of notification:  0555  Critical value read back:Yes.    Nurse who received alert:  S.Elynor Kallenberger,RN  MD notified (1st page):  MD aware value trending down  Time of first page:    MD notified (2nd page):  Time of second page:  Responding MD:    Time MD responded:

## 2016-11-14 NOTE — Consult Note (Signed)
PULMONARY / CRITICAL CARE MEDICINE   Name: [name] MRN: [id number] DOB: 1930/05/16    ADMISSION DATE:  11/13/2016 CONSULTATION DATE:  11/14/2016  REFERRING MD:  Jeneen Rinks  REASON FOR CONSULT:  Elevated lactic acid CHIEF COMPLAINT: DYSPNEA  HISTORY OF PRESENT ILLNESS:   83 year old female with a past medical history significant for diastolic heart failure and rheumatoid arthritis was admitted on 11/13/2016 in the setting of fever and shortness of breath. She states that she lives in a nursing home. She's had these symptoms for approximately 2 days and she was sent here from her nursing facility for further evaluation. In the emergency department she was administered albuterol and her lactic acid was noted to be elevated. She was given IV fluids and antibiotics and admitted to the hospital for further evaluation. She's still saying that she has some dyspnea.  Pulmonary and critical care medicine was consulted because of rising lactic acid.  PAST MEDICAL HISTORY :  She  has a past medical history of Anxiety; Arthritis; Chronic diastolic CHF (congestive heart failure) ([place]); Coronary artery disease; CVA (cerebral vascular accident) ([place]); Depression; DVT of lower extremity (deep venous thrombosis) ([place]); Dyspnea; Elevated troponin; Enteritis due to Clostridium difficile (01/04/2013); Essential hypertension; Fall; Fatigue; GERD (gastroesophageal reflux disease); GI bleed (2005); HCAP (healthcare-associated pneumonia) (01/22/2013); Hemorrhoids; Hiatal hernia; Hyperlipidemia; Hypothyroidism; Malnutrition ([place]); Obstructive hypertrophic cardiomyopathy ([place]); Osteoporosis; PUD (peptic ulcer disease); RA (rheumatoid arthritis) ([place]); Recurrent colitis due to Clostridium difficile (06/02/2012); Renal insufficiency; Right ear pain; Sepsis ([place]) (12/19/2012); TIA (transient ischemic attack); and Weight loss.  PAST SURGICAL HISTORY: She  has a past surgical history that includes NSTEMI (06/2010); SPINAL FUSION  SURGERY; Knee surgery; Cardiac catheterization; Back surgery; Breast surgery (1964); Ankle surgery; and Colonoscopy.  Allergies  Allergen Reactions  . Codeine Swelling  . Sulfa Antibiotics Other (See Comments)    GI upset  . Biphosphate Other (See Comments)  . Morphine And Related Other (See Comments)  . Percocet [Oxycodone-Acetaminophen] Other (See Comments)  . Plavix [Clopidogrel Bisulfate] Itching and Rash    No current facility-administered medications on file prior to encounter.    Current Outpatient Prescriptions on File Prior to Encounter  Medication Sig  . apixaban (ELIQUIS) 5 MG TABS tablet Take 5 mg by mouth 2 (two) times daily. For DVT  . Artificial Saliva (ACT DRY MOUTH) LOZG Use as directed in the mouth or throat as needed (for dry mouth).  . calcium-vitamin D (OSCAL WITH D) 500-200 MG-UNIT per tablet Take 1 tablet by mouth 3 (three) times daily.  . carboxymethylcellulose 1 % ophthalmic solution Apply 1 drop to eye 3 (three) times daily. Both eyes  . cetirizine (ZYRTEC) 10 MG tablet Take 10 mg by mouth daily.   . Cholecalciferol (VITAMIN D) 2000 UNITS CAPS Take 1 capsule (2,000 Units total) by mouth daily.  . Cranberry 200 MG CAPS Take 2 capsules by mouth 2 (two) times daily.   Marland Kitchen denosumab (PROLIA) 60 MG/ML SOLN injection Inject 60 mg into the skin every 6 (six) months. Administer in upper arm, thigh, or abdomen  . esomeprazole (NEXIUM) 40 MG capsule Take 40 mg by mouth 2 (two) times daily before a meal.   . ferrous sulfate 325 (65 FE) MG tablet Take 325 mg by mouth daily.   . hydrochlorothiazide (HYDRODIURIL) 12.5 MG tablet Take 1 tablet (12.5 mg total) by mouth daily.  Marland Kitchen HYDROcodone-acetaminophen (NORCO/VICODIN) 5-325 MG tablet Take 1 tablet by mouth daily.  Marland Kitchen ipratropium (ATROVENT) 0.03 % nasal spray Place 2 sprays into both  nostrils daily as needed (For asthma).   . ipratropium-albuterol (DUONEB) 0.5-2.5 (3) MG/3ML SOLN Take 3 mLs by nebulization every 8 (eight) hours  as needed (wheezing).   Marland Kitchen levothyroxine (SYNTHROID, LEVOTHROID) 25 MCG tablet Take 1 tablet (25 mcg total) by mouth daily before breakfast.  . lidocaine (LIDODERM) 5 % Place 1 patch onto the skin daily. Remove & Discard patch within 12 hours or as directed by MD  . LORazepam (ATIVAN) 0.5 MG tablet Take one tablet by mouth every night at bedtime for anxiety  . meclizine (ANTIVERT) 12.5 MG tablet Take 12.5 mg by mouth 2 (two) times daily.   . Menthol, Topical Analgesic, (BIOFREEZE) 4 % GEL Apply 1 application topically 2 (two) times daily. For wrist pain   . metoprolol (LOPRESSOR) 50 MG tablet Take 50 mg by mouth 2 (two) times daily.   . montelukast (SINGULAIR) 10 MG tablet Take 10 mg by mouth at bedtime.   . Multiple Vitamins-Minerals (CERTAVITE/ANTIOXIDANTS PO) Take 1 tablet by mouth daily.   . nitrofurantoin, macrocrystal-monohydrate, (MACROBID) 100 MG capsule Take 100 mg by mouth daily.   . nitroGLYCERIN (NITROSTAT) 0.4 MG SL tablet Place 0.4 mg under the tongue every 5 (five) minutes as needed for chest pain.  Marland Kitchen ondansetron (ZOFRAN) 4 MG tablet Take 4 mg by mouth every 6 (six) hours as needed for nausea or vomiting.  . OXYGEN Inhale 2 L into the lungs as needed (To maintain saturations >90%).   Marland Kitchen PATADAY 0.2 % SOLN Place 1 drop into both eyes daily.   . potassium chloride SA (K-DUR,KLOR-CON) 20 MEQ tablet Take 1 tablet (20 mEq total) by mouth daily.  . predniSONE (DELTASONE) 10 MG tablet Take 10 mg by mouth daily. For RA  . rosuvastatin (CRESTOR) 20 MG tablet Take 20 mg by mouth every evening. For hyperlipidemia  . saccharomyces boulardii (FLORASTOR) 250 MG capsule Take 1 capsule (250 mg total) by mouth 2 (two) times daily.  . sertraline (ZOLOFT) 50 MG tablet Take 50 mg by mouth every morning. For depression    FAMILY HISTORY:  Her indicated that her mother is deceased. She indicated that her father is deceased. She indicated that her sister is alive. She indicated that her brother is alive.  She indicated that her maternal grandmother is deceased. She indicated that her maternal grandfather is deceased. She indicated that her paternal grandmother is deceased. She indicated that her paternal grandfather is deceased. She indicated that her daughter is alive. She indicated that both of her sons are alive.    SOCIAL HISTORY: She  reports that she has never smoked. She has never used smokeless tobacco. She reports that she does not drink alcohol or use drugs.  REVIEW OF SYSTEMS:   Gen: Denies fever, chills, weight change, fatigue, night sweats HEENT: Denies blurred vision, double vision, hearing loss, tinnitus, sinus congestion, rhinorrhea, sore throat, neck stiffness, dysphagia PULM: per HPI CV: Denies chest pain, edema, orthopnea, paroxysmal nocturnal dyspnea, palpitations GI: Denies abdominal pain, nausea, vomiting, diarrhea, hematochezia, melena, constipation, change in bowel habits GU: Denies dysuria, hematuria, polyuria, oliguria, urethral discharge Endocrine: Denies hot or cold intolerance, polyuria, polyphagia or appetite change Derm: Denies rash, dry skin, scaling or peeling skin change Heme: Denies easy bruising, bleeding, bleeding gums Neuro: Denies headache, numbness, weakness, slurred speech, loss of memory or consciousness   SUBJECTIVE:  As above  VITAL SIGNS: BP 130/60   Pulse (!) 110   Temp 98.4 F (36.9 C) (Oral)   Resp (!) 26  Ht 5\' 6"  (1.676 m)   Wt 130 lb 11.7 oz (59.3 kg)   LMP  (LMP Unknown)   SpO2 95%   BMI 21.10 kg/m   HEMODYNAMICS:    VENTILATOR SETTINGS:    INTAKE / OUTPUT: No intake/output data recorded.  PHYSICAL EXAMINATION: General:  Awake, in bed, no distress Neuro:  Alert and oriented 4, normal movement HEENT:  Normocephalic, atraumatic, oropharynx clear Cardiovascular:  Tachycardic, regular rhythm Lungs:  Wheezing bilaterally, normal respiratory effort, speaking in full sentences Abdomen:  Bowel sounds positive, nontender  nondistended Musculoskeletal:  Diminished bulk and tone bilaterally Skin:  Thin skin, dry, no rash  LABS:  BMET  Recent Labs Lab 11/13/16 1655  NA 141  K 3.6  CL 105  CO2 24  BUN 29*  CREATININE 1.59*  GLUCOSE 189*    Electrolytes  Recent Labs Lab 11/13/16 1655  CALCIUM 8.9    CBC  Recent Labs Lab 11/13/16 1655  WBC 12.8*  HGB 11.7*  HCT 37.5  PLT 173    Coag's  Recent Labs Lab 11/13/16 2336  APTT 20*  INR 1.33    Sepsis Markers  Recent Labs Lab 11/13/16 1655 11/13/16 1951 11/13/16 2336  LATICACIDVEN 3.3* 7.7* 8.7*  PROCALCITON  --   --  0.18    ABG No results for input(s): PHART, PCO2ART, PO2ART in the last 168 hours.  Liver Enzymes No results for input(s): AST, ALT, ALKPHOS, BILITOT, ALBUMIN in the last 168 hours.  Cardiac Enzymes No results for input(s): TROPONINI, PROBNP in the last 168 hours.  Glucose No results for input(s): GLUCAP in the last 168 hours.  Imaging Dg Chest 2 View  Result Date: 11/13/2016 CLINICAL DATA:  83 year old female with fever, nonproductive cough, wheezing and shortness of breath. EXAM: CHEST  2 VIEW COMPARISON:  09/15/2016 and earlier. FINDINGS: AP and lateral views of the chest. Chronic hiatal hernia. Stable cardiac size and mediastinal contours. Calcified aortic atherosclerosis. Stable to mildly improved lung volumes. No pneumothorax. No pulmonary edema or pleural effusion. No consolidation. No convincing acute pulmonary opacity. Extensive chronic thoracolumbar spinal hardware appears stable. Negative visible bowel gas pattern. Stable visualized osseous structures. IMPRESSION: No acute cardiopulmonary abnormality. Electronically Signed   By: Genevie Ann M.D.   On: 11/13/2016 16:53     STUDIES:  Chest x-ray images independently reviewed showing spinal hardware, no pulmonary infiltrate  CULTURES: 11/13/2016 blood culture  ANTIBIOTICS: May 17 cefepime May 17 vancomycin  SIGNIFICANT  EVENTS:   LINES/TUBES:   DISCUSSION: 83 year old female admitted with fever and dyspnea and pulmonary and critical care medicine was consulted due to a rising lactic acid. On physical exam she shows no signs of shock, her blood pressure is normal though she does have wheezing bilaterally. She has a B type lactic acidosis, or lactic acidosis in the setting of normal cardiac function and perfusion and normal oxygenation. This can come from enzyme decoupling in mitochondria from medication effect. A well-documented side effect of heavy administration of albuterol is lactic acidosis. This patient received continuous albuterol for at least 1 hour though charting would suggest she may received it for longer than that in the emergency department. I believe that her rising lactic acidosis is due primarily to the high dose of albuterol she received earlier. The differential diagnosis includes bowel ischemia though her clinical history does not suggest that she has no abdominal pain. Renal failure and hepatic failure can also cause this at times. This time is no clear evidence of those.  ASSESSMENT /  PLAN:  PULMONARY A: Wheezing Likely acute viral illness She does not have pneumonia P:   Use ipratropium as needed, avoid albuterol  RENAL A:   Lactic acidosis, see discussion above P:   Check hepatic function panel Monitor renal status Avoid aggressive volume resuscitation as she is not in shock and not hypovolemic If lactic acid continues to rise been check a CT abdomen to evaluate for bowel ischemia Consider liver imaging with ultrasound of CT not performed   Roselie Awkward, MD [facility] Pager: [phone] Cell: [phone] After 3pm or if no response, call [phone]   11/14/2016, 1:37 AM

## 2016-11-14 NOTE — Progress Notes (Signed)
CRITICAL VALUE ALERT  Critical value received:  Lactic acid-5.9 and Troponin-0.16  Date of notification:  11/14/16  Time of notification:  0239  Critical value read back:Yes.    Nurse who received alert:  S.Roddie Riegler,RN  MD notified (1st page):  Dr. Hal Hope  Time of first page:  0316  MD notified (2nd page):  Time of second page:  Responding MD:  Dr. Hal Hope  Time MD responded:  [phone]

## 2016-11-14 NOTE — Progress Notes (Signed)
PULMONARY / CRITICAL CARE MEDICINE   Name: [name] MRN: [id number] DOB: [birth year]    ADMISSION DATE:  11/13/2016 CONSULTATION DATE:  11/14/2016  REFERRING MD:  Jeneen Rinks  REASON FOR CONSULT:  Elevated lactic acid CHIEF COMPLAINT: DYSPNEA  SUBJECTIVE:  Feels better   VITAL SIGNS: BP (!) 169/92   Pulse 92   Temp 99 F (37.2 C) (Oral)   Resp (!) 23   Ht 5\' 6"  (1.676 m)   Wt 130 lb 11.7 oz (59.3 kg)   LMP  (LMP Unknown)   SpO2 95%   BMI 21.10 kg/m   HEMODYNAMICS:    VENTILATOR SETTINGS:    INTAKE / OUTPUT: I/O last 3 completed shifts: In: 1378.8 [I.V.:1328.8; IV Piggyback:50] Out: -   General appearance:  83 Year old /female, well nourished NAD,conversant  Eyes: anicteric sclerae, moist conjunctivae; PERRL, EOMI bilaterally. Mouth:  membranes and no mucosal ulcerations; normal hard and soft palate Neck: Trachea midline; neck supple, no JVD, + upper airway wheeze Lungs/chest: exp wheeze, with normal respiratory effort and no intercostal retractions CV: RRR, no MRGs  Abdomen: Soft, non-tender; no masses or HSM Extremities: No peripheral edema or extremity lymphadenopathy Skin: Normal temperature, turgor and texture; no rash, ulcers or subcutaneous nodules Neuro/ Psych: Appropriate affect, alert and oriented to person, place and time  LABS:  BMET  Recent Labs Lab 11/13/16 1655 11/14/16 0516  NA 141 141  K 3.6 4.0  CL 105 114*  CO2 24 19*  BUN 29* 26*  CREATININE 1.59* 1.34*  GLUCOSE 189* 170*    Electrolytes  Recent Labs Lab 11/13/16 1655 11/14/16 0516  CALCIUM 8.9 7.4*    CBC  Recent Labs Lab 11/13/16 1655 11/14/16 0516  WBC 12.8* 11.6*  HGB 11.7* 10.0*  HCT 37.5 31.2*  PLT 173 147*    Coag's  Recent Labs Lab 11/13/16 2336  APTT 20*  INR 1.33    Sepsis Markers  Recent Labs Lab 11/13/16 2336 11/14/16 0203 11/14/16 0516 11/14/16 0818  LATICACIDVEN 8.7* 5.9* 2.4* 1.6  PROCALCITON 0.18  --   --   --     ABG No  results for input(s): PHART, PCO2ART, PO2ART in the last 168 hours.  Liver Enzymes  Recent Labs Lab 11/14/16 0203  AST 94*  ALT 66*  ALKPHOS 48  BILITOT 0.4  ALBUMIN 2.8*    Cardiac Enzymes  Recent Labs Lab 11/14/16 0203 11/14/16 0818  TROPONINI 0.16* 0.62*    Glucose No results for input(s): GLUCAP in the last 168 hours.  Imaging Dg Chest 2 View  Result Date: 11/13/2016 CLINICAL DATA:  83 year old female with fever, nonproductive cough, wheezing and shortness of breath. EXAM: CHEST  2 VIEW COMPARISON:  09/15/2016 and earlier. FINDINGS: AP and lateral views of the chest. Chronic hiatal hernia. Stable cardiac size and mediastinal contours. Calcified aortic atherosclerosis. Stable to mildly improved lung volumes. No pneumothorax. No pulmonary edema or pleural effusion. No consolidation. No convincing acute pulmonary opacity. Extensive chronic thoracolumbar spinal hardware appears stable. Negative visible bowel gas pattern. Stable visualized osseous structures. IMPRESSION: No acute cardiopulmonary abnormality. Electronically Signed   By: Genevie Ann M.D.   On: 11/13/2016 16:53     STUDIES:  Chest x-ray images independently reviewed showing spinal hardware, no pulmonary infiltrate  CULTURES: 11/13/2016 blood culture  ANTIBIOTICS: May 17 cefepime May 17 vancomycin  SIGNIFICANT EVENTS:   LINES/TUBES:  ASSESSMENT / PLAN:  Wheezing in the setting of acute viral illness. ? Also element of subclinical GERD Plan Cont ipratropium  Add reflux rx Supportive care  Lactic acidosis-->resolved.    Discussion 83 year old female admitted w/ fever and dyspnea in what is likely a viral illness. We were consulted for lactic acidosis. This was in the setting of normal hemodynamics and excellent oxygenation. She had received extensive CAB. Lactic acidosis can be a result of heavy albuterol administration. It has resolved since stopping the med.  We will sign off   Erick Colace  ACNP-BC [facility] Pager # [phone] OR # [phone] if no answer  11/14/2016, 9:42 AM

## 2016-11-14 NOTE — Progress Notes (Signed)
Pharmacy Antibiotic Note  [name] is a 83 y.o. female c/o cough admitted on 11/13/2016 with pneumonia.  Pharmacy has been consulted for cefepime and vancomycin dosing.  Plan: Vancomycin 1 Gm x1 then 500 mg IV q24h VT=15-20 mg/L Cefepime 2 Gm x1 then 1 Gm IV q24h F/u scr/cultures/levels  Height: 5\' 6"  (167.6 cm) Weight: 130 lb 11.7 oz (59.3 kg) IBW/kg (Calculated) : 59.3  Temp (24hrs), Avg:98.4 F (36.9 C), Min:96.6 F (35.9 C), Max:99.6 F (37.6 C)   Recent Labs Lab 11/13/16 1655 11/13/16 1951 11/13/16 2336  WBC 12.8*  --   --   CREATININE 1.59*  --   --   LATICACIDVEN 3.3* 7.7* 8.7*    Estimated Creatinine Clearance: 24.7 mL/min (A) (by C-G formula based on SCr of 1.59 mg/dL (H)).    Allergies  Allergen Reactions  . Codeine Swelling  . Sulfa Antibiotics Other (See Comments)    GI upset  . Biphosphate Other (See Comments)  . Morphine And Related Other (See Comments)  . Percocet [Oxycodone-Acetaminophen] Other (See Comments)  . Plavix [Clopidogrel Bisulfate] Itching and Rash    Antimicrobials this admission: 5/17 zosyn >> x1 ED 5/17 vancomycin >>  5/18 cefepime >>  Dose adjustments this admission:   Microbiology results:  BCx:   UCx:    Sputum:    MRSA PCR:   Thank you for allowing pharmacy to be a part of this patient's care.  Dorrene German 11/14/2016 1:44 AM

## 2016-11-14 NOTE — Progress Notes (Signed)
The patient is otherwise reasonably stable but her lactate continues to uptrend for unclear reasons.  Currently 8.7 on her 3rd liter of NS.  No significant abdominal symptoms to raise concern for ischemic bowel.  I have consulted PCCM to assist with the care of this patient - I spoke with Dr. Jimmy Footman who suggests adding LFTs since this abnormality can impair her ability to clear lactate.  Will also check an Echo tomorrow and troponin tonight.  Will continue to trend lactate  They will add her to the list to be seen by PCCM.  I will also make Dr. Hal Hope aware of the patient and ask him to check in on her.  Carlyon Shadow, M.D.

## 2016-11-14 NOTE — NC FL2 (Signed)
[place] MEDICAID FL2 LEVEL OF CARE SCREENING TOOL     IDENTIFICATION  Patient Name: [name] Birthdate: 1930-01-02 Sex: female Admission Date (Current Location): 11/13/2016  [facility] and [place] Number:  Herbalist and Address:  [facility],  [place] [address] [place]      Provider Number: [id number]  Attending Physician Name and Address:  Cristal Ford, DO  Relative Name and Phone Number:       Current Level of Care: Hospital Recommended Level of Care: [place] Prior Approval Number:    Date Approved/Denied:   PASRR Number:    Discharge Plan: SNF    Current Diagnoses: Patient Active Problem List   Diagnosis Date Noted  . Hyperglycemia 11/14/2016  . History of DVT (deep vein thrombosis) 11/13/2016  . Sepsis ([place]) 09/15/2016  . Wrist swelling, left 09/15/2016  . Acute encephalopathy 09/15/2016  . Pain and swelling of left wrist   . UTI (urinary tract infection) 04/08/2016  . Benign paroxysmal positional vertigo 12/19/2015  . Easy bruising 09/20/2015  . CKD (chronic kidney disease) stage 3, GFR 30-59 ml/min 07/27/2015  . Anemia of chronic disease 07/27/2015  . Pulmonary hypertension ([place])   . Generalized osteoarthritis 04/11/2015  . Thyroid activity decreased 04/11/2015  . Coronary artery disease   . Obstructive hypertrophic cardiomyopathy ([place])   . Chronic diastolic CHF (congestive heart failure) ([place])   . HCAP (healthcare-associated pneumonia) 11/24/2014  . Vertigo 05/17/2014  . Hemorrhoids 04/24/2014  . Hypothyroidism 12/16/2013  . HLD (hyperlipidemia) 12/16/2013  . Benign hypertensive heart and kidney disease with diastolic CHF, NYHA class II and CKD stage III ([place]) 12/16/2013  . Allergic rhinitis 07/05/2013  . Acute kidney injury superimposed on CKD ([place]) 01/22/2013  . Adrenal insufficiency ([place]) 12/08/2011    Class: Chronic  . History of CVA (cerebrovascular accident) 12/07/2011    Class: Acute   . Dysphagia 12/07/2011    Class: Acute  . MI (myocardial infarction) ([place])   . RA (rheumatoid arthritis) ([place])   . PUD (peptic ulcer disease)   . Hiatal hernia with gastroesophageal reflux   . Hiatal hernia   . Osteoporosis   . Depression   . Anxiety     Orientation RESPIRATION BLADDER Height & Weight     Self, Time, Place  O2 Incontinent Weight: 130 lb 11.7 oz (59.3 kg) Height:  5\' 6"  (167.6 cm)  BEHAVIORAL SYMPTOMS/MOOD NEUROLOGICAL BOWEL NUTRITION STATUS  Other (Comment) (No behaviors)   Incontinent Diet  AMBULATORY STATUS COMMUNICATION OF NEEDS Skin   Extensive Assist Verbally Normal                       Personal Care Assistance Level of Assistance  Bathing, Feeding, Dressing Bathing Assistance: Maximum assistance Feeding assistance: Limited assistance Dressing Assistance: Maximum assistance     Functional Limitations Info  Sight, Hearing, Speech Sight Info: Adequate Hearing Info: Adequate Speech Info: Adequate    SPECIAL CARE FACTORS FREQUENCY                       Contractures Contractures Info: Not present    Additional Factors Info  Code Status Code Status Info: Full Code             Current Medications (11/14/2016):  This is the current hospital active medication list Current Facility-Administered Medications  Medication Dose Route Frequency Provider Last Rate Last Dose  . 0.9 %  sodium chloride infusion   Intravenous  Continuous Karmen Bongo, MD 75 mL/hr at 11/14/16 [phone]    . apixaban (ELIQUIS) tablet 2.5 mg  2.5 mg Oral BID Mikhail, Velta Addison, DO      . ceFEPIme (MAXIPIME) 1 g in dextrose 5 % 50 mL IVPB  1 g Intravenous Q24H Dorrene German, [facility]      . HYDROcodone-acetaminophen (NORCO/VICODIN) 5-325 MG per tablet 1 tablet  1 tablet Oral Daily Karmen Bongo, MD   1 tablet at 11/14/16 0825  . ipratropium (ATROVENT) nebulizer solution 0.5 mg  0.5 mg Nebulization Q4H PRN Simonne Maffucci B, MD      . levothyroxine (SYNTHROID,  LEVOTHROID) tablet 25 mcg  25 mcg Oral QAC breakfast Karmen Bongo, MD   25 mcg at 11/14/16 0825  . loratadine (CLARITIN) tablet 10 mg  10 mg Oral Daily Karmen Bongo, MD   10 mg at 11/14/16 0825  . LORazepam (ATIVAN) tablet 0.5 mg  0.5 mg Oral QHS Karmen Bongo, MD   0.5 mg at 11/14/16 0050  . meclizine (ANTIVERT) tablet 12.5 mg  12.5 mg Oral BID Karmen Bongo, MD   12.5 mg at 11/14/16 0051  . metoprolol tartrate (LOPRESSOR) tablet 50 mg  50 mg Oral BID Karmen Bongo, MD   50 mg at 11/14/16 0825  . montelukast (SINGULAIR) tablet 10 mg  10 mg Oral QHS Karmen Bongo, MD      . pantoprazole (PROTONIX) EC tablet 40 mg  40 mg Oral Daily Karmen Bongo, MD   40 mg at 11/14/16 0825  . polyvinyl alcohol (LIQUIFILM TEARS) 1.4 % ophthalmic solution 1 drop  1 drop Both Eyes TID Karmen Bongo, MD   1 drop at 11/14/16 0825  . predniSONE (DELTASONE) tablet 10 mg  10 mg Oral QAC breakfast Karmen Bongo, MD   10 mg at 11/14/16 0825  . rosuvastatin (CRESTOR) tablet 20 mg  20 mg Oral QPM Karmen Bongo, MD      . sertraline (ZOLOFT) tablet 50 mg  50 mg Oral q morning - 10a Karmen Bongo, MD   50 mg at 11/14/16 0825     Discharge Medications: Please see discharge summary for a list of discharge medications.  Relevant Imaging Results:  Relevant Lab Results:   Additional Information SSn: [id number]  Wilkie Zenon, Randall An, LCSW

## 2016-11-15 DIAGNOSIS — I5032 Chronic diastolic (congestive) heart failure: Secondary | ICD-10-CM

## 2016-11-15 LAB — CBC
HEMATOCRIT: 31.8 % — AB (ref 36.0–46.0)
HEMOGLOBIN: 10.2 g/dL — AB (ref 12.0–15.0)
MCH: 29.2 pg (ref 26.0–34.0)
MCHC: 32.1 g/dL (ref 30.0–36.0)
MCV: 91.1 fL (ref 78.0–100.0)
Platelets: 163 10*3/uL (ref 150–400)
RBC: 3.49 MIL/uL — ABNORMAL LOW (ref 3.87–5.11)
RDW: 15.7 % — ABNORMAL HIGH (ref 11.5–15.5)
WBC: 22.8 10*3/uL — ABNORMAL HIGH (ref 4.0–10.5)

## 2016-11-15 LAB — BASIC METABOLIC PANEL
Anion gap: 9 (ref 5–15)
BUN: 26 mg/dL — ABNORMAL HIGH (ref 6–20)
CO2: 18 mmol/L — ABNORMAL LOW (ref 22–32)
CREATININE: 1.39 mg/dL — AB (ref 0.44–1.00)
Calcium: 7.5 mg/dL — ABNORMAL LOW (ref 8.9–10.3)
Chloride: 114 mmol/L — ABNORMAL HIGH (ref 101–111)
GFR calc non Af Amer: 34 mL/min — ABNORMAL LOW (ref >60)
GFR, EST AFRICAN AMERICAN: 39 mL/min — AB (ref >60)
Glucose, Bld: 132 mg/dL — ABNORMAL HIGH (ref 65–99)
Potassium: 3.4 mmol/L — ABNORMAL LOW (ref 3.5–5.1)
Sodium: 141 mmol/L (ref 135–145)

## 2016-11-15 LAB — BRAIN NATRIURETIC PEPTIDE: B Natriuretic Peptide: 1009.7 pg/mL — ABNORMAL HIGH (ref 0.0–100.0)

## 2016-11-15 LAB — TROPONIN I: Troponin I: 0.43 ng/mL (ref <0.03)

## 2016-11-15 MED ORDER — FUROSEMIDE 10 MG/ML IJ SOLN
INTRAMUSCULAR | Status: AC
  Filled 2016-11-15: qty 2

## 2016-11-15 MED ORDER — LORAZEPAM 2 MG/ML IJ SOLN
0.5000 mg | Freq: Once | INTRAMUSCULAR | Status: AC
  Administered 2016-11-15: 0.5 mg via INTRAVENOUS

## 2016-11-15 MED ORDER — IPRATROPIUM BROMIDE 0.02 % IN SOLN
RESPIRATORY_TRACT | Status: AC
  Filled 2016-11-15: qty 2.5

## 2016-11-15 MED ORDER — IPRATROPIUM BROMIDE 0.02 % IN SOLN
0.5000 mg | Freq: Three times a day (TID) | RESPIRATORY_TRACT | Status: DC
Start: 2016-11-15 — End: 2016-11-16

## 2016-11-15 MED ORDER — LORAZEPAM 2 MG/ML IJ SOLN
INTRAMUSCULAR | Status: AC
  Filled 2016-11-15: qty 1

## 2016-11-15 MED ORDER — FUROSEMIDE 10 MG/ML IJ SOLN
20.0000 mg | Freq: Once | INTRAMUSCULAR | Status: AC
  Administered 2016-11-15: 20 mg via INTRAVENOUS

## 2016-11-15 MED ORDER — LEVALBUTEROL HCL 0.63 MG/3ML IN NEBU
INHALATION_SOLUTION | RESPIRATORY_TRACT | Status: AC
  Filled 2016-11-15: qty 3

## 2016-11-15 MED ORDER — HYDRALAZINE HCL 20 MG/ML IJ SOLN
10.0000 mg | Freq: Once | INTRAMUSCULAR | Status: AC
  Administered 2016-11-15: 10 mg via INTRAVENOUS

## 2016-11-15 MED ORDER — LEVALBUTEROL HCL 0.63 MG/3ML IN NEBU: 0.6300 mg | INHALATION_SOLUTION | Freq: Three times a day (TID) | RESPIRATORY_TRACT | Status: DC

## 2016-11-15 NOTE — Progress Notes (Signed)
Pt increasingly anxious throughout the night- oxygen saturations are stable, but requesting oxygen d/t stated shortness of breath. Expiratory wheezes are audible throughout lung fields. Pt placed on 2L [place]. Will continue to monitor

## 2016-11-15 NOTE — Progress Notes (Signed)
Pt experiencing tachypnea into the 40's with expiratory wheezes, rhonchi, and labored breathing, BP elevated to 193/92 despite PRN hydralazine, visibly anxious. Pt unable to slow respirations with coaching. MD notified- orders received. Will continue to monitor.

## 2016-11-15 NOTE — Progress Notes (Addendum)
PROGRESS NOTE    [name]  BWG:[id number] DOB: [birth year] DOA: 11/13/2016 PCP: [name], MD   Chief Complaint  Patient presents with  . Fever  . Shortness of Breath    Brief Narrative:  HPI on 11/13/2016 by Dr. Karmen Bongo [name] is a 83 y.o. female with medical history significant of hypertrophic cardiomyopathy, chronic diastolic heart failure, CAD, hyperlipidemia, and essential hypertension, rheumatoid arthritis on chronic prednisone and hypothyroidism presenting with cough yesterday, worse all night.  Unable to eat.  Low-grade fever today with wheezing.  PA was at the facility and was concerned that she had congestion.  Uncertain how high the fever was, maybe 100.  +SOB.  Non-productive cough.   She was hospitalized from 3/19-22 with sepsis due to UTI and left wrist celluliitis.  Assessment & Plan   Sepsis secondary to possible viral illness -Presented with Tachycardia, tachypnea, leukocytosis -Blood cultures show no growth to date -Chest x-ray and UA unremarkable for infection -Patient started on vancomycin and cefepime -Discontinued vancomycin today as patient had negative MRSA screen -Strep pneumonia urine antigen negative -Continue ipratropium and supportive care -patient did have a spike in her WBC, possible reactive. Patient is currently on prednisone   Acute on chronic respiratory failure/ Possible Acute diastolic heart failure -Patient is on chronic home oxygen, 3L -Currently on 3L -Patient did receive 3L of IVF on admission given her sepsis and elevated lactic acid -Will order BNP -Patient became very tachypneic and restless overnight. Was given Ativan and IV lasix, breathing mildly improved. -Will continue to monitor patient closely -home HCTZ was held  Lactic acidosis -Thought to be secondary to sepsis versus nebulizer treatments with albuterol -PCCM consulted and appreciated and has signed off -lactic acid peaked at 8.7, now 1.6  -was given  IVF  Elevated troponin -peaked at 0.64, trending downward 0.43 -Echocardiogram: Showed EF of 17-79%, grade 1 diastolic dysfunction, mild MR and MS, severe LAE, mild to moderate TR with moderately elevated pulm pressure -Likely secondary to sepsis/demand ischemia  Essential hypertension -Continue metoprolol -HCTZ held on admission due to sepsis -Continue IV hydralazine PRN  Chronic kidney disease, stage III -Creatinine currently 1.39, and at baseline -Continue to monitor BMP  Rheumatoid arthritis -Continue prednisone  Hypothyroidism -Continue Synthroid -TSH 0.353 (WNL)  History of DVT -Continue Eliqius, dose was reduced to 2.5 mg twice a day  Hyperglycemia -Patient is on chronic steroids -last hemoglobin A1c 6.4 in July 2017  DVT Prophylaxis  Eliquis  Code Status: Full  Family Communication: None at bedside. Daughter via phone  Disposition Plan: Admitted, continue to monitor in stepdown given respiratory status  Consultants PCCM  Procedures  Echocardiogram  Antibiotics   Anti-infectives    Start     Dose/Rate Route Frequency Ordered Stop   11/14/16 2200  ceFEPIme (MAXIPIME) 1 g in dextrose 5 % 50 mL IVPB     1 g 100 mL/hr over 30 Minutes Intravenous Every 24 hours 11/14/16 0144     11/14/16 1800  vancomycin (VANCOCIN) 500 mg in sodium chloride 0.9 % 100 mL IVPB  Status:  Discontinued     500 mg 100 mL/hr over 60 Minutes Intravenous Every 24 hours 11/14/16 0144 11/14/16 1015   11/13/16 2315  ceFEPIme (MAXIPIME) 2 g in dextrose 5 % 50 mL IVPB     2 g 100 mL/hr over 30 Minutes Intravenous  Once 11/13/16 2311 11/14/16 0137   11/13/16 2315  vancomycin (VANCOCIN) IVPB 1000 mg/200 mL premix  Status:  Discontinued  1,000 mg 200 mL/hr over 60 Minutes Intravenous  Once 11/13/16 2311 11/13/16 2317   11/13/16 1845  piperacillin-tazobactam (ZOSYN) IVPB 3.375 g     3.375 g 100 mL/hr over 30 Minutes Intravenous  Once 11/13/16 1833 11/13/16 1900   11/13/16 1815   piperacillin-tazobactam (ZOSYN) IVPB 3.375 g  Status:  Discontinued     3.375 g 100 mL/hr over 30 Minutes Intravenous  Once 11/13/16 1805 11/13/16 1833   11/13/16 1815  vancomycin (VANCOCIN) IVPB 1000 mg/200 mL premix     1,000 mg 200 mL/hr over 60 Minutes Intravenous  Once 11/13/16 1805 11/13/16 2050      Subjective:   [name] seen and examined today.  Patient states she is feeling better, however overnight she had shortness of breath and anxiety.  Continues to have dry cough. Denies chest pain, abdominal pain, nausea, vomiting, diarrhea, constipation, headache.   Objective:   Vitals:   11/15/16 0235 11/15/16 0400 11/15/16 0600 11/15/16 0703  BP: (!) 171/77 (!) 167/77 (!) 167/71   Pulse: (!) 111 (!) 102 96   Resp: (!) 36 (!) 29 (!) 33   Temp:    99 F (37.2 C)  TempSrc:    Axillary  SpO2: 96% 93% 96%   Weight:      Height:        Intake/Output Summary (Last 24 hours) at 11/15/16 0934 Last data filed at 11/15/16 0600  Gross per 24 hour  Intake             1900 ml  Output                0 ml  Net             1900 ml   Filed Weights   11/13/16 1548 11/13/16 2320  Weight: 56.7 kg (125 lb) 59.3 kg (130 lb 11.7 oz)    Exam  General: Well developed, well nourished, no apparent distress  HEENT: NCAT, mucous membranes moist.   Cardiovascular: S1 S2 auscultated, RRR, no murmurs  Respiratory: Diffuse expiratory wheezing. Diminished breath sounds.  Abdomen: Soft, nontender, nondistended, + bowel sounds  Extremities: warm dry without cyanosis clubbing or edema  Neuro: AAOx3, nonfocal  Psych: Appropriate mood and affect   Data Reviewed: I have personally reviewed following labs and imaging studies  CBC:  Recent Labs Lab 11/13/16 1655 11/14/16 0516 11/15/16 0741  WBC 12.8* 11.6* 22.8*  NEUTROABS 10.8* 10.5*  --   HGB 11.7* 10.0* 10.2*  HCT 37.5 31.2* 31.8*  MCV 91.5 91.8 91.1  PLT 173 147* 952   Basic Metabolic Panel:  Recent Labs Lab  11/13/16 1655 11/14/16 0516 11/15/16 0741  NA 141 141 141  K 3.6 4.0 3.4*  CL 105 114* 114*  CO2 24 19* 18*  GLUCOSE 189* 170* 132*  BUN 29* 26* 26*  CREATININE 1.59* 1.34* 1.39*  CALCIUM 8.9 7.4* 7.5*   GFR: Estimated Creatinine Clearance: 28.2 mL/min (A) (by C-G formula based on SCr of 1.39 mg/dL (H)). Liver Function Tests:  Recent Labs Lab 11/14/16 0203  AST 94*  ALT 66*  ALKPHOS 48  BILITOT 0.4  PROT 5.6*  ALBUMIN 2.8*   No results for input(s): LIPASE, AMYLASE in the last 168 hours. No results for input(s): AMMONIA in the last 168 hours. Coagulation Profile:  Recent Labs Lab 11/13/16 2336  INR 1.33   Cardiac Enzymes:  Recent Labs Lab 11/14/16 0203 11/14/16 0818 11/14/16 1401 11/15/16 0741  TROPONINI 0.16* 0.62* 0.64* 0.43*  BNP (last 3 results) No results for input(s): PROBNP in the last 8760 hours. HbA1C: No results for input(s): HGBA1C in the last 72 hours. CBG: No results for input(s): GLUCAP in the last 168 hours. Lipid Profile: No results for input(s): CHOL, HDL, LDLCALC, TRIG, CHOLHDL, LDLDIRECT in the last 72 hours. Thyroid Function Tests:  Recent Labs  11/14/16 0203  TSH 0.353  FREET4 0.70   Anemia Panel: No results for input(s): VITAMINB12, FOLATE, FERRITIN, TIBC, IRON, RETICCTPCT in the last 72 hours. Urine analysis:    Component Value Date/Time   COLORURINE YELLOW 11/13/2016 2010   APPEARANCEUR CLEAR 11/13/2016 2010   APPEARANCEUR Hazy 03/06/2013 1013   LABSPEC 1.018 11/13/2016 2010   LABSPEC 1.013 03/06/2013 1013   PHURINE 5.0 11/13/2016 2010   GLUCOSEU 150 (A) 11/13/2016 2010   GLUCOSEU Negative 03/06/2013 1013   HGBUR NEGATIVE 11/13/2016 2010   BILIRUBINUR NEGATIVE 11/13/2016 2010   BILIRUBINUR Negative 03/06/2013 [place] 11/13/2016 2010   PROTEINUR 30 (A) 11/13/2016 2010   UROBILINOGEN 0.2 12/18/2012 1203   NITRITE NEGATIVE 11/13/2016 2010   LEUKOCYTESUR NEGATIVE 11/13/2016 2010   LEUKOCYTESUR 3+  03/06/2013 1013   Sepsis Labs: @LABRCNTIP (procalcitonin:4,lacticidven:4)  ) Recent Results (from the past 240 hour(s))  Culture, blood (x 2)     Status: None (Preliminary result)   Collection Time: 11/13/16 11:12 PM  Result Value Ref [place] Status   Specimen Description BLOOD LEFT ANTECUBITAL  Final   Special Requests   Final    BOTTLES DRAWN AEROBIC AND ANAEROBIC Blood Culture adequate volume   Culture   Final    NO GROWTH < 12 HOURS Performed at [facility], [place] [address] [place], [place] [address]    Report Status PENDING  Incomplete  Culture, blood (x 2)     Status: None (Preliminary result)   Collection Time: 11/13/16 11:17 PM  Result Value Ref [place] Status   Specimen Description BLOOD LEFT HAND  Final   Special Requests IN PEDIATRIC BOTTLE Blood Culture adequate volume  Final   Culture   Final    NO GROWTH < 12 HOURS Performed at [facility], [place] [address] [place], [place] [address]    Report Status PENDING  Incomplete  MRSA PCR Screening     Status: None   Collection Time: 11/13/16 11:41 PM  Result Value Ref [place] Status   MRSA by PCR NEGATIVE NEGATIVE Final    Comment:        The GeneXpert MRSA Assay (FDA approved for NASAL specimens only), is one component of a comprehensive MRSA colonization surveillance program. It is not intended to diagnose MRSA infection nor to guide or monitor treatment for MRSA infections.       Radiology Studies: Dg Chest 2 View  Result Date: 11/13/2016 CLINICAL DATA:  83 year old female with fever, nonproductive cough, wheezing and shortness of breath. EXAM: CHEST  2 VIEW COMPARISON:  09/15/2016 and earlier. FINDINGS: AP and lateral views of the chest. Chronic hiatal hernia. Stable cardiac size and mediastinal contours. Calcified aortic atherosclerosis. Stable to mildly improved lung volumes. No pneumothorax. No pulmonary edema or pleural effusion. No consolidation. No convincing acute pulmonary opacity. Extensive  chronic thoracolumbar spinal hardware appears stable. Negative visible bowel gas pattern. Stable visualized osseous structures. IMPRESSION: No acute cardiopulmonary abnormality. Electronically Signed   By: Genevie Ann M.D.   On: 11/13/2016 16:53     Scheduled Meds: . apixaban  2.5 mg Oral BID  . HYDROcodone-acetaminophen  1  tablet Oral Daily  . ipratropium-albuterol  3 mL Nebulization TID  . levothyroxine  25 mcg Oral QAC breakfast  . loratadine  10 mg Oral Daily  . LORazepam  0.5 mg Oral QHS  . meclizine  12.5 mg Oral BID  . metoprolol tartrate  50 mg Oral BID  . montelukast  10 mg Oral QHS  . pantoprazole  40 mg Oral Daily  . polyvinyl alcohol  1 drop Both Eyes TID  . predniSONE  10 mg Oral QAC breakfast  . rosuvastatin  20 mg Oral QPM  . sertraline  50 mg Oral q morning - 10a   Continuous Infusions: . sodium chloride 75 mL/hr at 11/15/16 0600  . ceFEPime (MAXIPIME) IV Stopped (11/14/16 2301)     LOS: 2 days   Time Spent in minutes   30 minutes  Ulysee Fyock D.O. on 11/15/2016 at 9:34 AM  Between 7am to 7pm - Pager - [phone]  After 7pm go to [email or web address] - password [email or web address]  And look for the night coverage person covering for me after hours  [facility] Office  [phone]

## 2016-11-16 DIAGNOSIS — I5033 Acute on chronic diastolic (congestive) heart failure: Secondary | ICD-10-CM

## 2016-11-16 DIAGNOSIS — E876 Hypokalemia: Secondary | ICD-10-CM

## 2016-11-16 LAB — BASIC METABOLIC PANEL
Anion gap: 4 — ABNORMAL LOW (ref 5–15)
BUN: 26 mg/dL — ABNORMAL HIGH (ref 6–20)
CO2: 21 mmol/L — ABNORMAL LOW (ref 22–32)
CREATININE: 1.16 mg/dL — AB (ref 0.44–1.00)
Calcium: 7.2 mg/dL — ABNORMAL LOW (ref 8.9–10.3)
Chloride: 115 mmol/L — ABNORMAL HIGH (ref 101–111)
GFR calc non Af Amer: 42 mL/min — ABNORMAL LOW (ref >60)
GFR, EST AFRICAN AMERICAN: 49 mL/min — AB (ref >60)
GLUCOSE: 91 mg/dL (ref 65–99)
Potassium: 3.3 mmol/L — ABNORMAL LOW (ref 3.5–5.1)
Sodium: 140 mmol/L (ref 135–145)

## 2016-11-16 LAB — CBC
HEMATOCRIT: 29.9 % — AB (ref 36.0–46.0)
Hemoglobin: 9.5 g/dL — ABNORMAL LOW (ref 12.0–15.0)
MCH: 29.1 pg (ref 26.0–34.0)
MCHC: 31.8 g/dL (ref 30.0–36.0)
MCV: 91.4 fL (ref 78.0–100.0)
PLATELETS: 136 10*3/uL — AB (ref 150–400)
RBC: 3.27 MIL/uL — ABNORMAL LOW (ref 3.87–5.11)
RDW: 15.9 % — AB (ref 11.5–15.5)
WBC: 14.7 10*3/uL — ABNORMAL HIGH (ref 4.0–10.5)

## 2016-11-16 LAB — MAGNESIUM: Magnesium: 1.3 mg/dL — ABNORMAL LOW (ref 1.7–2.4)

## 2016-11-16 MED ORDER — POTASSIUM CHLORIDE CRYS ER 20 MEQ PO TBCR
40.0000 meq | EXTENDED_RELEASE_TABLET | Freq: Once | ORAL | Status: AC
  Administered 2016-11-16: 40 meq via ORAL
  Filled 2016-11-16: qty 2

## 2016-11-16 MED ORDER — IPRATROPIUM BROMIDE 0.02 % IN SOLN: 0.5000 mg | Freq: Four times a day (QID) | RESPIRATORY_TRACT | Status: DC | PRN

## 2016-11-16 MED ORDER — FUROSEMIDE 10 MG/ML IJ SOLN
40.0000 mg | Freq: Two times a day (BID) | INTRAMUSCULAR | Status: DC
  Administered 2016-11-16 – 2016-11-17 (×3): 40 mg via INTRAVENOUS
  Filled 2016-11-16 (×3): qty 4

## 2016-11-16 MED ORDER — LEVALBUTEROL HCL 0.63 MG/3ML IN NEBU: 0.6300 mg | INHALATION_SOLUTION | Freq: Four times a day (QID) | RESPIRATORY_TRACT | Status: DC | PRN

## 2016-11-16 NOTE — Progress Notes (Signed)
PROGRESS NOTE    [name]  WER:[id number] DOB: 05-06-1930 DOA: 11/13/2016 PCP: [name], MD   Chief Complaint  Patient presents with  . Fever  . Shortness of Breath    Brief Narrative:  HPI on 11/13/2016 by Dr. Karmen Bongo [name] is a 83 y.o. female with medical history significant of hypertrophic cardiomyopathy, chronic diastolic heart failure, CAD, hyperlipidemia, and essential hypertension, rheumatoid arthritis on chronic prednisone and hypothyroidism presenting with cough yesterday, worse all night.  Unable to eat.  Low-grade fever today with wheezing.  PA was at the facility and was concerned that she had congestion.  Uncertain how high the fever was, maybe 100.  +SOB.  Non-productive cough.   She was hospitalized from 3/19-22 with sepsis due to UTI and left wrist celluliitis.  Assessment & Plan   Sepsis secondary to possible viral illness -Presented with Tachycardia, tachypnea, leukocytosis -Blood cultures show no growth to date -Chest x-ray and UA unremarkable for infection -Patient started on vancomycin and cefepime -Discontinued vancomycin today as patient had negative MRSA screen -Strep pneumonia urine antigen negative -Continue ipratropium and supportive care -leukocytosis improving today  Acute on chronic respiratory failure/ Acute diastolic heart failure -Patient is on chronic home oxygen, 3L -Currently on 3L -Patient did receive 3L of IVF on admission given her sepsis and elevated lactic acid -BNP 1009.7 -HCTZ held -Placed on IV lasix 40mg  BID -will place foley catheter to obtain accurate output as patient is incontinent -Monitor intake, output, daily weights  Lactic acidosis -Thought to be secondary to sepsis versus nebulizer treatments with albuterol -PCCM consulted and appreciated and has signed off -lactic acid peaked at 8.7, down to 1.6 -was given IVF- discontinued IVF  Elevated troponin -peaked at 0.64, trending downward  0.43 -Echocardiogram: Showed EF of 19-50%, grade 1 diastolic dysfunction, mild MR and MS, severe LAE, mild to moderate TR with moderately elevated pulm pressure -Likely secondary to sepsis/demand ischemia  Essential hypertension -Continue metoprolol -HCTZ held on admission due to sepsis -Continue IV hydralazine PRN  Chronic kidney disease, stage III -Creatinine currently 1.16, and at baseline -Continue to monitor BMP  Rheumatoid arthritis -Continue prednisone  Hypothyroidism -Continue Synthroid -TSH 0.353 (WNL)  History of DVT -Continue Eliqius, dose was reduced to 2.5 mg twice a day  Hyperglycemia -Patient is on chronic steroids -last hemoglobin A1c 6.4 in July 2017  Hypokalemia -will replace and continue to monitor BMP -Obtain magnesium level  DVT Prophylaxis  Eliquis  Code Status: Full  Family Communication: None at bedside.   Disposition Plan: Admitted, continue to monitor in stepdown given respiratory status- will start IV lasix. Suspect discharge to home in 72 hours  Consultants PCCM  Procedures  Echocardiogram  Antibiotics   Anti-infectives    Start     Dose/Rate Route Frequency Ordered Stop   11/14/16 2200  ceFEPIme (MAXIPIME) 1 g in dextrose 5 % 50 mL IVPB     1 g 100 mL/hr over 30 Minutes Intravenous Every 24 hours 11/14/16 0144     11/14/16 1800  vancomycin (VANCOCIN) 500 mg in sodium chloride 0.9 % 100 mL IVPB  Status:  Discontinued     500 mg 100 mL/hr over 60 Minutes Intravenous Every 24 hours 11/14/16 0144 11/14/16 1015   11/13/16 2315  ceFEPIme (MAXIPIME) 2 g in dextrose 5 % 50 mL IVPB     2 g 100 mL/hr over 30 Minutes Intravenous  Once 11/13/16 2311 11/14/16 0137   11/13/16 2315  vancomycin (VANCOCIN) IVPB 1000 mg/200 mL  premix  Status:  Discontinued     1,000 mg 200 mL/hr over 60 Minutes Intravenous  Once 11/13/16 2311 11/13/16 2317   11/13/16 1845  piperacillin-tazobactam (ZOSYN) IVPB 3.375 g     3.375 g 100 mL/hr over 30 Minutes  Intravenous  Once 11/13/16 1833 11/13/16 1900   11/13/16 1815  piperacillin-tazobactam (ZOSYN) IVPB 3.375 g  Status:  Discontinued     3.375 g 100 mL/hr over 30 Minutes Intravenous  Once 11/13/16 1805 11/13/16 1833   11/13/16 1815  vancomycin (VANCOCIN) IVPB 1000 mg/200 mL premix     1,000 mg 200 mL/hr over 60 Minutes Intravenous  Once 11/13/16 1805 11/13/16 2050      Subjective:   [name] seen and examined today.  She continues to state she feels her breathing has improved. Denies productive cough. Denies chest pain, abdominal pain, dizziness or headache.   Objective:   Vitals:   11/16/16 0009 11/16/16 0200 11/16/16 0400 11/16/16 0600  BP:  (!) 174/69 (!) 150/68 (!) 161/67  Pulse:  85 89 94  Resp:  (!) 21 (!) 27 (!) 28  Temp: 98 F (36.7 C)     TempSrc: Oral     SpO2:  98% 97% 94%  Weight:      Height:        Intake/Output Summary (Last 24 hours) at 11/16/16 1010 Last data filed at 11/16/16 0700  Gross per 24 hour  Intake             2215 ml  Output                0 ml  Net             2215 ml   Filed Weights   11/13/16 1548 11/13/16 2320  Weight: 56.7 kg (125 lb) 59.3 kg (130 lb 11.7 oz)   Exam  General: Well developed, well nourished, NAD  HEENT: NCAT, mucous membranes moist.   Neck: Supple, no masses  Cardiovascular: S1 S2 auscultated, no rubs, murmurs or gallops. Regular rate and rhythm.  Respiratory: Diffuse expiratory wheezing, diminished breath sounds  Abdomen: Soft, nontender, nondistended, + bowel sounds  Extremities: warm dry without cyanosis clubbing or edema  Neuro: AAOx3, nonfocal  Psych: Appropriate mood and affect  Data Reviewed: I have personally reviewed following labs and imaging studies  CBC:  Recent Labs Lab 11/13/16 1655 11/14/16 0516 11/15/16 0741 11/16/16 0753  WBC 12.8* 11.6* 22.8* 14.7*  NEUTROABS 10.8* 10.5*  --   --   HGB 11.7* 10.0* 10.2* 9.5*  HCT 37.5 31.2* 31.8* 29.9*  MCV 91.5 91.8 91.1 91.4  PLT 173  147* 163 409*   Basic Metabolic Panel:  Recent Labs Lab 11/13/16 1655 11/14/16 0516 11/15/16 0741 11/16/16 0753  NA 141 141 141 140  K 3.6 4.0 3.4* 3.3*  CL 105 114* 114* 115*  CO2 24 19* 18* 21*  GLUCOSE 189* 170* 132* 91  BUN 29* 26* 26* 26*  CREATININE 1.59* 1.34* 1.39* 1.16*  CALCIUM 8.9 7.4* 7.5* 7.2*   GFR: Estimated Creatinine Clearance: 33.8 mL/min (A) (by C-G formula based on SCr of 1.16 mg/dL (H)). Liver Function Tests:  Recent Labs Lab 11/14/16 0203  AST 94*  ALT 66*  ALKPHOS 48  BILITOT 0.4  PROT 5.6*  ALBUMIN 2.8*   No results for input(s): LIPASE, AMYLASE in the last 168 hours. No results for input(s): AMMONIA in the last 168 hours. Coagulation Profile:  Recent Labs Lab 11/13/16 2336  INR  1.33   Cardiac Enzymes:  Recent Labs Lab 11/14/16 0203 11/14/16 0818 11/14/16 1401 11/15/16 0741  TROPONINI 0.16* 0.62* 0.64* 0.43*   BNP (last 3 results) No results for input(s): PROBNP in the last 8760 hours. HbA1C: No results for input(s): HGBA1C in the last 72 hours. CBG: No results for input(s): GLUCAP in the last 168 hours. Lipid Profile: No results for input(s): CHOL, HDL, LDLCALC, TRIG, CHOLHDL, LDLDIRECT in the last 72 hours. Thyroid Function Tests:  Recent Labs  11/14/16 0203  TSH 0.353  FREET4 0.70   Anemia Panel: No results for input(s): VITAMINB12, FOLATE, FERRITIN, TIBC, IRON, RETICCTPCT in the last 72 hours. Urine analysis:    Component Value Date/Time   COLORURINE YELLOW 11/13/2016 2010   APPEARANCEUR CLEAR 11/13/2016 2010   APPEARANCEUR Hazy 03/06/2013 1013   LABSPEC 1.018 11/13/2016 2010   LABSPEC 1.013 03/06/2013 1013   PHURINE 5.0 11/13/2016 2010   GLUCOSEU 150 (A) 11/13/2016 2010   GLUCOSEU Negative 03/06/2013 1013   HGBUR NEGATIVE 11/13/2016 2010   BILIRUBINUR NEGATIVE 11/13/2016 2010   BILIRUBINUR Negative 03/06/2013 [place] 11/13/2016 2010   PROTEINUR 30 (A) 11/13/2016 2010   UROBILINOGEN 0.2  12/18/2012 1203   NITRITE NEGATIVE 11/13/2016 2010   LEUKOCYTESUR NEGATIVE 11/13/2016 2010   LEUKOCYTESUR 3+ 03/06/2013 1013   Sepsis Labs: @LABRCNTIP (procalcitonin:4,lacticidven:4)  ) Recent Results (from the past 240 hour(s))  Culture, blood (x 2)     Status: None (Preliminary result)   Collection Time: 11/13/16 11:12 PM  Result Value Ref Range Status   Specimen Description BLOOD LEFT ANTECUBITAL  Final   Special Requests   Final    BOTTLES DRAWN AEROBIC AND ANAEROBIC Blood Culture adequate volume   Culture   Final    NO GROWTH 1 DAY Performed at [facility], [place] [address] [place], [place] [address]    Report Status PENDING  Incomplete  Culture, blood (x 2)     Status: None (Preliminary result)   Collection Time: 11/13/16 11:17 PM  Result Value Ref Range Status   Specimen Description BLOOD LEFT HAND  Final   Special Requests IN PEDIATRIC BOTTLE Blood Culture adequate volume  Final   Culture   Final    NO GROWTH 1 DAY Performed at [facility], [place] [address] [place], [place] [address]    Report Status PENDING  Incomplete  MRSA PCR Screening     Status: None   Collection Time: 11/13/16 11:41 PM  Result Value Ref Range Status   MRSA by PCR NEGATIVE NEGATIVE Final    Comment:        The GeneXpert MRSA Assay (FDA approved for NASAL specimens only), is one component of a comprehensive MRSA colonization surveillance program. It is not intended to diagnose MRSA infection nor to guide or monitor treatment for MRSA infections.       Radiology Studies: No results found.   Scheduled Meds: . apixaban  2.5 mg Oral BID  . furosemide  40 mg Intravenous BID  . HYDROcodone-acetaminophen  1 tablet Oral Daily  . levothyroxine  25 mcg Oral QAC breakfast  . loratadine  10 mg Oral Daily  . LORazepam  0.5 mg Oral QHS  . meclizine  12.5 mg Oral BID  . metoprolol tartrate  50 mg Oral BID  . montelukast  10 mg Oral QHS  . pantoprazole  40 mg Oral Daily  .  polyvinyl alcohol  1 drop Both Eyes TID  . predniSONE  10  mg Oral QAC breakfast  . rosuvastatin  20 mg Oral QPM  . sertraline  50 mg Oral q morning - 10a   Continuous Infusions: . ceFEPime (MAXIPIME) IV Stopped (11/15/16 2130)     LOS: 3 days   Time Spent in minutes   30 minutes  Ulysess Witz D.O. on 11/16/2016 at 10:10 AM  Between 7am to 7pm - Pager - [phone]  After 7pm go to [email or web address] - password [email or web address]  And look for the night coverage person covering for me after hours  [facility] Office  [phone]

## 2016-11-16 NOTE — Progress Notes (Signed)
RT notified by pts. RN that previous evening pt. became increasingly anxious/shacky from Duoneb aerosol tx. with >'d wheezing post tx., RT assessed pt. with audible wheezing noted upon entering room, auscultated breath sounds and wheezing was localized to upper airways pt. stated," uses oxygen with humidified concentrator where she stays", RT added humidifier to pts. oxygen via nasal cannula, RN made away, RT to monitor.

## 2016-11-16 NOTE — Progress Notes (Signed)
This is an acute visit.  Level care skilled.  Facility is [facility] complaint acute visit secondary to cough suspected low-grade fever change in status.  History of present illness.  Patient is a pleasant 83 year old female seen today for increased cough congestion possible low-grade fever and increased weakness.  She was recently hospitalized back in March for UTI as well as a left wrist cellulitis.  She was actually seen recently for increased erythema of her left wristn was started on doxycycline.  . She has medical history of rheumatoid arthritis, chronic DVT, chronic diastolic heart failure, coronary artery disease, hyperlipidemia, hypothyroidism, among others.   Past Medical History:  Diagnosis Date  . Anxiety   . Arthritis   . Chronic diastolic CHF (congestive heart failure) ([place])    a. 10/2014 Echo: EF 60-65%, mild LVH, grade 1 DD, dynamic obstruction @ rest - peak velocity of 271 cm/sec, peak grad of 68mmHg, PASP 95mmHg.  Marland Kitchen Coronary artery disease    a. 2012 s/p NSTEMI/Cath: 95% apical LAD dzs, otw nonobs dzs-->Med Rx;  b. 05/2011 MV: no ischemia, EF 79%, inf infarct- attenuation.  . CVA (cerebral vascular accident) ([place])   . Depression   . DVT of lower extremity (deep venous thrombosis) ([place])   . Dyspnea   . Elevated troponin    a. 10/2014 in setting of sepsis/?HCAP.  Marland Kitchen Enteritis due to Clostridium difficile 01/04/2013  . Essential hypertension   . Fall   . Fatigue   . GERD (gastroesophageal reflux disease)   . GI bleed 2005  . HCAP (healthcare-associated pneumonia) 01/22/2013  . Hemorrhoids   . Hiatal hernia   . Hyperlipidemia   . Hypothyroidism   . Malnutrition ([place])   . Obstructive hypertrophic cardiomyopathy ([place])    a. 10/2014 Echo: EF 60-65%, mild LVH, grade 1 DD, dynamic obstruction @ rest - peak velocity of 271 cm/sec, peak grad of 18mmHg, PASP 58mmHg.  . Osteoporosis   . PUD (peptic ulcer disease)   . RA (rheumatoid  arthritis) ([place])   . Recurrent colitis due to Clostridium difficile 06/02/2012  . Renal insufficiency   . Right ear pain   . Sepsis ([place]) 12/19/2012  . TIA (transient ischemic attack)   . Weight loss         Past Surgical History:  Procedure Laterality Date  . ANKLE SURGERY    . BACK SURGERY    . [place]   x3  . CARDIAC CATHETERIZATION     SHOWED RUPTURE PLAQUE IN THE LAD. THE LAD IS NONOBSTRUCTIVE WITH ONLY 30-40% STENOSIS  . COLONOSCOPY    . KNEE SURGERY    . NSTEMI  06/2010  . SPINAL FUSION SURGERY           Allergies  Allergen Reactions  . Codeine Swelling  . Sulfa Antibiotics Other (See Comments)    GI upset  . Biphosphate Other (See Comments)  . Morphine And Related Other (See Comments)  . Percocet [Oxycodone-Acetaminophen] Other (See Comments)  . Plavix [Clopidogrel Bisulfate] Itching and Rash        Allergies as of 11/04/2016      Reactions   Codeine Swelling   Sulfa Antibiotics Other (See Comments)   GI upset   Biphosphate Other (See Comments)   Morphine And Related Other (See Comments)   Percocet [oxycodone-acetaminophen] Other (See Comments)   Plavix [clopidogrel Bisulfate] Itching, Rash               Medication List  apixaban (ELIQUIS) 5 MG TABS tablet Take 5 mg by mouth 2 (two) times daily. For DVT   Yes [provider]  Artificial Saliva (ACT DRY MOUTH) LOZG Use as directed in the mouth or throat as needed (for dry mouth).   Yes [provider]  calcium-vitamin D (OSCAL WITH D) 500-200 MG-UNIT per tablet Take 1 tablet by mouth 3 (three) times daily.   Yes [provider]  carboxymethylcellulose 1 % ophthalmic solution Apply 1 drop to eye 3 (three) times daily. Both eyes   Yes [provider]  cetirizine (ZYRTEC) 10 MG tablet Take 10 mg by mouth daily.    Yes [provider]  Cholecalciferol (VITAMIN D) 2000 UNITS CAPS Take 1 capsule (2,000 Units  total) by mouth daily. 11/21/13  Yes [name], NP  Cranberry 200 MG CAPS Take 2 capsules by mouth 2 (two) times daily.    Yes [provider]  denosumab (PROLIA) 60 MG/ML SOLN injection Inject 60 mg into the skin every 6 (six) months. Administer in upper arm, thigh, or abdomen   Yes [provider]  esomeprazole (NEXIUM) 40 MG capsule Take 40 mg by mouth 2 (two) times daily before a meal.    Yes [provider]  ferrous sulfate 325 (65 FE) MG tablet Take 325 mg by mouth daily.    Yes [provider]  guaiFENesin (ROBITUSSIN) 100 MG/5ML liquid Take 200 mg by mouth 4 (four) times daily as needed for cough.   Yes [provider]  hydrochlorothiazide (HYDRODIURIL) 12.5 MG tablet Take 1 tablet (12.5 mg total) by mouth daily. 01/14/16  Yes Ngetich, Dinah C, NP  HYDROcodone-acetaminophen (NORCO/VICODIN) 5-325 MG tablet Take 1 tablet by mouth daily.   Yes [provider]  ipratropium (ATROVENT) 0.03 % nasal spray Place 2 sprays into both nostrils daily as needed (For asthma).    Yes [provider]  ipratropium-albuterol (DUONEB) 0.5-2.5 (3) MG/3ML SOLN Take 3 mLs by nebulization every 8 (eight) hours as needed (wheezing).    Yes [provider]  levothyroxine (SYNTHROID, LEVOTHROID) 25 MCG tablet Take 1 tablet (25 mcg total) by mouth daily before breakfast. 09/19/16  Yes Rosita Fire, MD  lidocaine (LIDODERM) 5 % Place 1 patch onto the skin daily. Remove & Discard patch within 12 hours or as directed by MD 04/04/15  Yes Lauree Chandler, NP  LORazepam (ATIVAN) 0.5 MG tablet Take one tablet by mouth every night at bedtime for anxiety 07/24/16  Yes Reed, Tiffany L, DO  meclizine (ANTIVERT) 12.5 MG tablet Take 12.5 mg by mouth 2 (two) times daily.    Yes [provider]  Menthol, Topical Analgesic, (BIOFREEZE) 4 % GEL Apply 1 application topically 2 (two) times daily. For wrist pain    Yes  [provider]  metoprolol (LOPRESSOR) 50 MG tablet Take 50 mg by mouth 2 (two) times daily.    Yes [provider]  montelukast (SINGULAIR) 10 MG tablet Take 10 mg by mouth at bedtime.    Yes [provider]  Multiple Vitamins-Minerals (CERTAVITE/ANTIOXIDANTS PO) Take 1 tablet by mouth daily.    Yes [provider]  nitrofurantoin, macrocrystal-monohydrate, (MACROBID) 100 MG capsule Take 100 mg by mouth daily.    Yes [provider]  nitroGLYCERIN (NITROSTAT) 0.4 MG SL tablet Place 0.4 mg under the tongue every 5 (five) minutes as needed for chest pain.   Yes [provider]  ondansetron (ZOFRAN) 4 MG tablet Take 4 mg by  mouth every 6 (six) hours as needed for nausea or vomiting.   Yes [provider]  OXYGEN Inhale 2 L into the lungs as needed (To maintain saturations >90%).    Yes [provider]  PATADAY 0.2 % SOLN Place 1 drop into both eyes daily.  03/26/15  Yes [provider]  potassium chloride SA (K-DUR,KLOR-CON) 20 MEQ tablet Take 1 tablet (20 mEq total) by mouth daily. 09/19/16  Yes Rosita Fire, MD  predniSONE (DELTASONE) 10 MG tablet Take 10 mg by mouth daily. For RA 03/08/15  Yes [provider]  rosuvastatin (CRESTOR) 20 MG tablet Take 20 mg by mouth every evening. For hyperlipidemia 07/05/13  Yes [name], NP  saccharomyces boulardii (FLORASTOR) 250 MG capsule Take 1 capsule (250 mg total) by mouth 2 (two) times daily. 11/04/16 11/14/16 Yes Ngetich, Dinah C, NP  sertraline (ZOLOFT) 50 MG tablet Take 50 mg by mouth every morning. For depression   Yes [provider]                                                                               Review of systems.  In general is not really complaining of chills but does appear to have a low-grade fever.  Skin does not complain of rashes or itching.  Head ears eyes nose  mouth and throat does not complain specifically of sore throat or headache or visual changes.  Respiratory as noted to have some increase congestion and cough does not complain of acute shortness of breath but has somewhat chronic dyspnea it appears.  Cardiac not complaining of chest pain or significant edema.  GI is not complaining of any nausea or vomiting or abdominal discomfort.  Muscle- skeletal has somewhat diffuse weakness with a history of rheumatoid arthritis is not complaining of acute pain however.  Neurologic is not complaining of dizziness headache in usual weakness.  Psych is not complaining of overt anxiety per daughter to appears to be somewhat more lethargic change in status from even yesterday.  Physical exam.  Temperatures 100.0 pulse 93 respirations 18 blood pressure 133/73 O2 saturation is 93%.  In general this is a somewhat frail elderly female appear somewhat chronically ill but not in acute distress she is responsive and alert.  Her skin is warm and dry.  Oropharynx is clear mucous membranes appear fairly moist.  Chest she has diffuse coarse breath sounds and coughs with any attempted deep inspiration there is no labored breathing.  Heart is regular rate and rhythm without murmur gallop or rub she does not have significant lower extremity edema.  Her abdomen soft nontender positive bowel sounds.  Musculoskeletal has general frailty with arthritic changes erythema of the left lower arm appears improved  Neurologic she is alert somewhat confused appears to be a poor story could not really appreciate lateralizing findings.  Psych she is oriented to self is pleasant and appropriate but does not speak much she does collect pens and actually asked for a pen before I left the room  Labs.  09/29/2016.  Sodium 140 potassium 3.8 BUN 17 creatinine 1.22.  Albumin 3.49 otherwise liver function tests within normal limits.   09/24/2016.  WBC  9.0 hemoglobin 9.3  platelets 121    07/01/2016.  WBC 5.9 hemoglobin 11.6 platelets 143   Assessment plan.  #1-increased cough congestion with low-grade temperature and apparently change in status per daughter-her daughter is quite concerned here-patient  is at risk of pneumonia she does have an order for duo nebs every 8 hours when necessary she also receives Atrovent inhaler-secondary to apparent change in status over the past 24 hours will send her to the ER for expedient evaluation currently in no acute distress but is at risk for sepsis here.  [id number]

## 2016-11-17 DIAGNOSIS — I5031 Acute diastolic (congestive) heart failure: Secondary | ICD-10-CM

## 2016-11-17 LAB — BASIC METABOLIC PANEL
Anion gap: 8 (ref 5–15)
BUN: 31 mg/dL — AB (ref 6–20)
CHLORIDE: 109 mmol/L (ref 101–111)
CO2: 25 mmol/L (ref 22–32)
CREATININE: 1.5 mg/dL — AB (ref 0.44–1.00)
Calcium: 7.5 mg/dL — ABNORMAL LOW (ref 8.9–10.3)
GFR calc Af Amer: 36 mL/min — ABNORMAL LOW (ref >60)
GFR calc non Af Amer: 31 mL/min — ABNORMAL LOW (ref >60)
Glucose, Bld: 107 mg/dL — ABNORMAL HIGH (ref 65–99)
Potassium: 3.6 mmol/L (ref 3.5–5.1)
Sodium: 142 mmol/L (ref 135–145)

## 2016-11-17 LAB — CBC
HEMATOCRIT: 30.6 % — AB (ref 36.0–46.0)
HEMOGLOBIN: 9.4 g/dL — AB (ref 12.0–15.0)
MCH: 28.1 pg (ref 26.0–34.0)
MCHC: 30.7 g/dL (ref 30.0–36.0)
MCV: 91.3 fL (ref 78.0–100.0)
Platelets: 134 10*3/uL — ABNORMAL LOW (ref 150–400)
RBC: 3.35 MIL/uL — ABNORMAL LOW (ref 3.87–5.11)
RDW: 15.5 % (ref 11.5–15.5)
WBC: 10.5 10*3/uL (ref 4.0–10.5)

## 2016-11-17 MED ORDER — MAGNESIUM SULFATE 2 GM/50ML IV SOLN
2.0000 g | Freq: Once | INTRAVENOUS | Status: AC
  Administered 2016-11-17: 2 g via INTRAVENOUS
  Filled 2016-11-17: qty 50

## 2016-11-17 MED ORDER — ADULT MULTIVITAMIN W/MINERALS CH
1.0000 | ORAL_TABLET | Freq: Every day | ORAL | Status: DC
  Administered 2016-11-17 – 2016-11-19 (×3): 1 via ORAL
  Filled 2016-11-17 (×3): qty 1

## 2016-11-17 MED ORDER — FUROSEMIDE 10 MG/ML IJ SOLN
40.0000 mg | Freq: Every day | INTRAMUSCULAR | Status: DC
  Administered 2016-11-18: 40 mg via INTRAVENOUS
  Filled 2016-11-17: qty 4

## 2016-11-17 MED ORDER — POTASSIUM CHLORIDE CRYS ER 20 MEQ PO TBCR
40.0000 meq | EXTENDED_RELEASE_TABLET | Freq: Once | ORAL | Status: AC
  Administered 2016-11-17: 40 meq via ORAL
  Filled 2016-11-17: qty 2

## 2016-11-17 NOTE — Progress Notes (Signed)
Initial Nutrition Assessment  DOCUMENTATION CODES:   Not applicable  INTERVENTION:  - Will order daily multivitamin with minerals. - Continue to encourage PO intakes of meals and beverages. - RD will continue to monitor for additional needs.  NUTRITION DIAGNOSIS:   Inadequate oral intake related to poor appetite as evidenced by per patient/family report, meal completion < 50%.  GOAL:   Patient will meet greater than or equal to 90% of their needs  MONITOR:   PO intake, Weight trends, Labs  REASON FOR ASSESSMENT:   Low Braden  ASSESSMENT:   83 y.o. female with medical history significant of hypertrophic cardiomyopathy, chronic diastolic heart failure, CAD, hyperlipidemia, and essential hypertension, rheumatoid arthritis on chronic prednisone and hypothyroidism presenting with cough yesterday, worse all night.  Unable to eat.  Low-grade fever today with wheezing.  PA was at the facility and was concerned that she had congestion.  Uncertain how high the fever was, maybe 100.  +SOB.  Non-productive cough.    Pt seen or low Braden. BMI indicates normal weight. Per chart review, pt consumed 10% of breakfast, 15% of lunch, and 25% of dinner on 5/18 and 35% of dinner on 5/19; no other intakes documented since 5/19. She reports that she was at [facility] and did not like the food there so she was not eating well. She likes the food here better but appetite was poor on admission and is slowly increasing. She states 100% completion of pancakes and 50% coffee for breakfast this AM. Pt not interested in oral nutrition supplements at this time. She denies any nutrition-related needs but is appreciative of RD visit and is aware of ability to request RD to stop back on a later date.  Physical assessment shows no muscle and no fat wasting; mild edema noted. Per chart review, weight has been relatively stable since November.   Medications reviewed; 40 mg IV Lasix/day, 25 mcg oral  Synthroid/day, 2 g IV Mg sulfate x1 run today, 40 mg oral Protonix/day, 40 mEq oral KCl x1 dose yesterday and x1 dose today, 10 mg Deltasone/day.  Labs reviewed; BUN: 31 mg/dL, creatinine: 1.50 mg/dL, Ca: 7.5 mg/dL, Mg: 1.3 mg/dL, GFR: 31 mL/min, HgbA1c in 12/2015: 6.4%.   Diet Order:  Diet regular Room service appropriate? Yes; Fluid consistency: Thin; Fluid restriction: 1500 mL Fluid  Skin:  Reviewed, no issues  Last BM:  5/21  Height:   Ht Readings from Last 1 Encounters:  11/13/16 5\' 6"  (1.676 m)    Weight:   Wt Readings from Last 1 Encounters:  11/16/16 133 lb 9.6 oz (60.6 kg)    Ideal Body Weight:  59.09 kg  BMI:  Body mass index is 21.56 kg/m.  Estimated Nutritional Needs:   Kcal:  1395-1575 (23-26 kcal/kg)  Protein:  55-65 grams  Fluid:  1.5-1.7 L/day  EDUCATION NEEDS:   No education needs identified at this time    Jarome Matin, MS, RD, LDN, CNSC Inpatient Clinical Dietitian Pager # [phone] After hours/weekend pager # [phone]

## 2016-11-17 NOTE — Progress Notes (Signed)
  Date:  Nov 17, 2016  Chart reviewed for concurrent status and case management needs.  Will continue to follow patient progress. discharge to home in 72 hours  Discharge Planning: following for needs  Expected discharge date: [id number]  Velva Harman, BSN, [place], [place]

## 2016-11-17 NOTE — Progress Notes (Signed)
PROGRESS NOTE    [name]  CWC:[id number] DOB: 1930-03-22 DOA: 11/13/2016 PCP: [name], MD   Chief Complaint  Patient presents with  . Fever  . Shortness of Breath    Brief Narrative:  HPI on 11/13/2016 by Dr. Karmen Bongo [name] is a 83 y.o. female with medical history significant of hypertrophic cardiomyopathy, chronic diastolic heart failure, CAD, hyperlipidemia, and essential hypertension, rheumatoid arthritis on chronic prednisone and hypothyroidism presenting with cough yesterday, worse all night.  Unable to eat.  Low-grade fever today with wheezing.  PA was at the facility and was concerned that she had congestion.  Uncertain how high the fever was, maybe 100.  +SOB.  Non-productive cough.   She was hospitalized from 3/19-22 with sepsis due to UTI and left wrist celluliitis.  Interim history Admitted with possible viral illness vs pneumonia. Given IVF for elevated lactic acid and sepsis. Developed acute diastolic heart failure. Place on IV lasix, improving.   Assessment & Plan   Sepsis secondary to possible viral illness -Presented with Tachycardia, tachypnea, leukocytosis -Blood cultures show no growth to date -Chest x-ray and UA unremarkable for infection -Patient started on vancomycin and cefepime -Discontinued vancomycin today as patient had negative MRSA screen -Strep pneumonia urine antigen negative -Continue ipratropium and supportive care -Leukocytosis resolved, VSS stable.  Acute on chronic respiratory failure/ Acute diastolic heart failure -Patient is on chronic home oxygen, 3L -Currently on 3L -Patient did receive 3L of IVF on admission given her sepsis and elevated lactic acid -BNP 1009.7 -HCTZ held -Placed on IV lasix 40mg  BID -placed foley catheter for accurate output given incontinence and aggressive diuresis -Urine output over past 24 hours 3050 cc -Continue to monitor intake, output, daily weights  Lactic acidosis -Thought to be  secondary to sepsis versus nebulizer treatments with albuterol -PCCM consulted and appreciated and has signed off -lactic acid peaked at 8.7, down to 1.6 -was given IVF- discontinued IVF  Elevated troponin -peaked at 0.64, trending downward 0.43 -Echocardiogram: Showed EF of 76-16%, grade 1 diastolic dysfunction, mild MR and MS, severe LAE, mild to moderate TR with moderately elevated pulm pressure -Likely secondary to sepsis/demand ischemia  Essential hypertension -Continue metoprolol -HCTZ held on admission due to sepsis -Continue IV hydralazine PRN  Chronic kidney disease, stage III -Creatinine currently 1.16, and at baseline -Continue to monitor BMP  Rheumatoid arthritis -Continue prednisone  Hypothyroidism -Continue Synthroid -TSH 0.353 (WNL)  History of DVT -Continue Eliqius, dose was reduced to 2.5 mg twice a day  Hyperglycemia -Patient is on chronic steroids -last hemoglobin A1c 6.4 in July 2017  Hypokalemia/hypomagnesemia -Potassium replaced, now 3.6 (will continue to replace, goal of 4) -Magnesium 1.3, will replace and continue to monitor  Deconditioning -Will consult PT as patient has been sedentary for the past several days given acute illness/decompensation  DVT Prophylaxis  Eliquis  Code Status: Full  Family Communication: None at bedside.   Disposition Plan: Admitted.   Consultants PCCM  Procedures  Echocardiogram  Antibiotics   Anti-infectives    Start     Dose/Rate Route Frequency Ordered Stop   11/14/16 2200  ceFEPIme (MAXIPIME) 1 g in dextrose 5 % 50 mL IVPB     1 g 100 mL/hr over 30 Minutes Intravenous Every 24 hours 11/14/16 0144     11/14/16 1800  vancomycin (VANCOCIN) 500 mg in sodium chloride 0.9 % 100 mL IVPB  Status:  Discontinued     500 mg 100 mL/hr over 60 Minutes Intravenous Every 24 hours 11/14/16  0144 11/14/16 1015   11/13/16 2315  ceFEPIme (MAXIPIME) 2 g in dextrose 5 % 50 mL IVPB     2 g 100 mL/hr over 30 Minutes  Intravenous  Once 11/13/16 2311 11/14/16 0137   11/13/16 2315  vancomycin (VANCOCIN) IVPB 1000 mg/200 mL premix  Status:  Discontinued     1,000 mg 200 mL/hr over 60 Minutes Intravenous  Once 11/13/16 2311 11/13/16 2317   11/13/16 1845  piperacillin-tazobactam (ZOSYN) IVPB 3.375 g     3.375 g 100 mL/hr over 30 Minutes Intravenous  Once 11/13/16 1833 11/13/16 1900   11/13/16 1815  piperacillin-tazobactam (ZOSYN) IVPB 3.375 g  Status:  Discontinued     3.375 g 100 mL/hr over 30 Minutes Intravenous  Once 11/13/16 1805 11/13/16 1833   11/13/16 1815  vancomycin (VANCOCIN) IVPB 1000 mg/200 mL premix     1,000 mg 200 mL/hr over 60 Minutes Intravenous  Once 11/13/16 1805 11/13/16 2050      Subjective:   [name]  seen and examined today.  Feels her breathing has improved. Denies chest pain. Continues to have dry cough. Denies abdominal pain, nausea, vomiting, diarrhea, constipation.   Objective:   Vitals:   11/17/16 0400 11/17/16 0404 11/17/16 0600 11/17/16 0800  BP: (!) 161/57  (!) 139/52 (!) 130/49  Pulse: 64  65 67  Resp: 16  16 17   Temp:  98.6 F (37 C)    TempSrc:  Oral    SpO2: 100%  100% 90%  Weight:      Height:        Intake/Output Summary (Last 24 hours) at 11/17/16 0948 Last data filed at 11/17/16 0800  Gross per 24 hour  Intake              290 ml  Output             3135 ml  Net            -2845 ml   Filed Weights   11/13/16 1548 11/13/16 2320 11/16/16 1032  Weight: 56.7 kg (125 lb) 59.3 kg (130 lb 11.7 oz) 60.6 kg (133 lb 9.6 oz)   Exam  General: Well developed, well nourished, no distress  HEENT: NCAT, mucous membranes moist.   Cardiovascular: S1 S2 auscultated, RRR, no murmurs  Respiratory: Diminished breath sounds, however, clear. Wheezing has improved.   Abdomen: Soft, nontender, nondistended, + bowel sounds  Extremities: warm dry without cyanosis clubbing or edema  Neuro: AAOx3, nonfocal  Psych: Normal affect and demeanor, pleasant  Data  Reviewed: I have personally reviewed following labs and imaging studies  CBC:  Recent Labs Lab 11/13/16 1655 11/14/16 0516 11/15/16 0741 11/16/16 0753 11/17/16 0327  WBC 12.8* 11.6* 22.8* 14.7* 10.5  NEUTROABS 10.8* 10.5*  --   --   --   HGB 11.7* 10.0* 10.2* 9.5* 9.4*  HCT 37.5 31.2* 31.8* 29.9* 30.6*  MCV 91.5 91.8 91.1 91.4 91.3  PLT 173 147* 163 136* 213*   Basic Metabolic Panel:  Recent Labs Lab 11/13/16 1655 11/14/16 0516 11/15/16 0741 11/16/16 0753 11/17/16 0327  NA 141 141 141 140 142  K 3.6 4.0 3.4* 3.3* 3.6  CL 105 114* 114* 115* 109  CO2 24 19* 18* 21* 25  GLUCOSE 189* 170* 132* 91 107*  BUN 29* 26* 26* 26* 31*  CREATININE 1.59* 1.34* 1.39* 1.16* 1.50*  CALCIUM 8.9 7.4* 7.5* 7.2* 7.5*  MG  --   --   --  1.3*  --  GFR: Estimated Creatinine Clearance: 26.1 mL/min (A) (by C-G formula based on SCr of 1.5 mg/dL (H)). Liver Function Tests:  Recent Labs Lab 11/14/16 0203  AST 94*  ALT 66*  ALKPHOS 48  BILITOT 0.4  PROT 5.6*  ALBUMIN 2.8*   No results for input(s): LIPASE, AMYLASE in the last 168 hours. No results for input(s): AMMONIA in the last 168 hours. Coagulation Profile:  Recent Labs Lab 11/13/16 2336  INR 1.33   Cardiac Enzymes:  Recent Labs Lab 11/14/16 0203 11/14/16 0818 11/14/16 1401 11/15/16 0741  TROPONINI 0.16* 0.62* 0.64* 0.43*   BNP (last 3 results) No results for input(s): PROBNP in the last 8760 hours. HbA1C: No results for input(s): HGBA1C in the last 72 hours. CBG: No results for input(s): GLUCAP in the last 168 hours. Lipid Profile: No results for input(s): CHOL, HDL, LDLCALC, TRIG, CHOLHDL, LDLDIRECT in the last 72 hours. Thyroid Function Tests: No results for input(s): TSH, T4TOTAL, FREET4, T3FREE, THYROIDAB in the last 72 hours. Anemia Panel: No results for input(s): VITAMINB12, FOLATE, FERRITIN, TIBC, IRON, RETICCTPCT in the last 72 hours. Urine analysis:    Component Value Date/Time   COLORURINE  YELLOW 11/13/2016 2010   APPEARANCEUR CLEAR 11/13/2016 2010   APPEARANCEUR Hazy 03/06/2013 1013   LABSPEC 1.018 11/13/2016 2010   LABSPEC 1.013 03/06/2013 1013   PHURINE 5.0 11/13/2016 2010   GLUCOSEU 150 (A) 11/13/2016 2010   GLUCOSEU Negative 03/06/2013 1013   HGBUR NEGATIVE 11/13/2016 2010   BILIRUBINUR NEGATIVE 11/13/2016 2010   BILIRUBINUR Negative 03/06/2013 [place] 11/13/2016 2010   PROTEINUR 30 (A) 11/13/2016 2010   UROBILINOGEN 0.2 12/18/2012 1203   NITRITE NEGATIVE 11/13/2016 2010   LEUKOCYTESUR NEGATIVE 11/13/2016 2010   LEUKOCYTESUR 3+ 03/06/2013 1013   Sepsis Labs: @LABRCNTIP (procalcitonin:4,lacticidven:4)  ) Recent Results (from the past 240 hour(s))  Culture, blood (x 2)     Status: None (Preliminary result)   Collection Time: 11/13/16 11:12 PM  Result Value Ref Range Status   Specimen Description BLOOD LEFT ANTECUBITAL  Final   Special Requests   Final    BOTTLES DRAWN AEROBIC AND ANAEROBIC Blood Culture adequate volume   Culture   Final    NO GROWTH 3 DAYS Performed at [facility], [place] [address], [place], [place] [address]    Report Status PENDING  Incomplete  Culture, blood (x 2)     Status: None (Preliminary result)   Collection Time: 11/13/16 11:17 PM  Result Value Ref Range Status   Specimen Description BLOOD LEFT HAND  Final   Special Requests IN PEDIATRIC BOTTLE Blood Culture adequate volume  Final   Culture   Final    NO GROWTH 3 DAYS Performed at [facility], [place] [address], [place], [place] [address]    Report Status PENDING  Incomplete  MRSA PCR Screening     Status: None   Collection Time: 11/13/16 11:41 PM  Result Value Ref Range Status   MRSA by PCR NEGATIVE NEGATIVE Final    Comment:        The GeneXpert MRSA Assay (FDA approved for NASAL specimens only), is one component of a comprehensive MRSA colonization surveillance program. It is not intended to diagnose MRSA infection nor to guide or monitor  treatment for MRSA infections.       Radiology Studies: No results found.   Scheduled Meds: . apixaban  2.5 mg Oral BID  . furosemide  40 mg Intravenous BID  . HYDROcodone-acetaminophen  1 tablet Oral Daily  . levothyroxine  25 mcg Oral QAC breakfast  . loratadine  10 mg Oral Daily  . LORazepam  0.5 mg Oral QHS  . meclizine  12.5 mg Oral BID  . metoprolol tartrate  50 mg Oral BID  . montelukast  10 mg Oral QHS  . pantoprazole  40 mg Oral Daily  . polyvinyl alcohol  1 drop Both Eyes TID  . predniSONE  10 mg Oral QAC breakfast  . rosuvastatin  20 mg Oral QPM  . sertraline  50 mg Oral q morning - 10a   Continuous Infusions: . ceFEPime (MAXIPIME) IV Stopped (11/17/16 0102)     LOS: 4 days   Time Spent in minutes   30 minutes  [name] D.O. on 11/17/2016 at 9:48 AM  Between 7am to 7pm - Pager - [phone]  After 7pm go to [email or web address] - password [email or web address]  And look for the night coverage person covering for me after hours  [facility] Office  [phone]

## 2016-11-17 NOTE — Progress Notes (Signed)
Pharmacy Antibiotic Note  Shaquasia Caponigro is a 83 y.o. female c/o cough admitted on 11/13/2016 with pneumonia.  Pharmacy has been consulted for cefepime and vancomycin dosing.  Today, 11/17/2016:  D4 abx; TRH wants to treat for at least 5 days  RR better, weaning from [place]  WBC/temps/PCT/LA all normalized  Plan:  Continue cefepime 1 Gm IV q24h  F/u scr/cultures/levels  Height: 5\' 6"  (167.6 cm) Weight: 129 lb 6.6 oz (58.7 kg) IBW/kg (Calculated) : 59.3  Temp (24hrs), Avg:98.4 F (36.9 C), Min:97.6 F (36.4 C), Max:99.3 F (37.4 C)   Recent Labs Lab 11/13/16 1655 11/13/16 1951 11/13/16 2336 11/14/16 0203 11/14/16 0516 11/14/16 0818 11/15/16 0741 11/16/16 0753 11/17/16 0327  WBC 12.8*  --   --   --  11.6*  --  22.8* 14.7* 10.5  CREATININE 1.59*  --   --   --  1.34*  --  1.39* 1.16* 1.50*  LATICACIDVEN 3.3* 7.7* 8.7* 5.9* 2.4* 1.6  --   --   --     Estimated Creatinine Clearance: 25.9 mL/min (A) (by C-G formula based on SCr of 1.5 mg/dL (H)).    Allergies  Allergen Reactions  . Codeine Swelling  . Sulfa Antibiotics Other (See Comments)    GI upset  . Biphosphate Other (See Comments)  . Morphine And Related Other (See Comments)  . Percocet [Oxycodone-Acetaminophen] Other (See Comments)  . Plavix [Clopidogrel Bisulfate] Itching and Rash    Antimicrobials this admission: 5/17 zosyn >> x1 ED 5/18 cefepime >>  5/17 vancomycin >>5/18  Dose adjustments this admission: ---  Microbiology results: 5/17 MRSA PCR neg 5/17 strep pneum uag neg 5/18 BCx2>>ngtd   Thank you for allowing pharmacy to be a part of this patient's care.  Reuel Boom, PharmD, BCPS Pager: [phone] 11/17/2016, 3:36 PM

## 2016-11-18 LAB — CBC
HCT: 31.2 % — ABNORMAL LOW (ref 36.0–46.0)
HEMOGLOBIN: 9.7 g/dL — AB (ref 12.0–15.0)
MCH: 28 pg (ref 26.0–34.0)
MCHC: 31.1 g/dL (ref 30.0–36.0)
MCV: 90.2 fL (ref 78.0–100.0)
PLATELETS: 143 10*3/uL — AB (ref 150–400)
RBC: 3.46 MIL/uL — ABNORMAL LOW (ref 3.87–5.11)
RDW: 15 % (ref 11.5–15.5)
WBC: 10.4 10*3/uL (ref 4.0–10.5)

## 2016-11-18 LAB — BASIC METABOLIC PANEL
Anion gap: 8 (ref 5–15)
BUN: 29 mg/dL — AB (ref 6–20)
CALCIUM: 7.6 mg/dL — AB (ref 8.9–10.3)
CHLORIDE: 106 mmol/L (ref 101–111)
CO2: 26 mmol/L (ref 22–32)
CREATININE: 1.54 mg/dL — AB (ref 0.44–1.00)
GFR, EST AFRICAN AMERICAN: 35 mL/min — AB (ref >60)
GFR, EST NON AFRICAN AMERICAN: 30 mL/min — AB (ref >60)
Glucose, Bld: 89 mg/dL (ref 65–99)
Potassium: 4 mmol/L (ref 3.5–5.1)
Sodium: 140 mmol/L (ref 135–145)

## 2016-11-18 LAB — MAGNESIUM: Magnesium: 1.9 mg/dL (ref 1.7–2.4)

## 2016-11-18 MED ORDER — FUROSEMIDE 40 MG PO TABS
40.0000 mg | ORAL_TABLET | Freq: Every day | ORAL | Status: DC
  Administered 2016-11-19: 40 mg via ORAL
  Filled 2016-11-18: qty 1

## 2016-11-18 NOTE — Evaluation (Signed)
Physical Therapy Evaluation Patient Details Name: [name] MRN: [id number] DOB: 1930/01/02 Today's Date: 11/18/2016   History of Present Illness  Pt is an 83 y.o.femalewith hypertrophic cardiomyopathy, chronic diastolic heart failure, CAD, hyperlipidemia, and essential hypertension, rheumatoid arthritis and hypothyroidism was brought to the ER 11/13/16 with cogh and fever..Recent hospitalization for UTI. patient resides in SNF.[name].  Clinical Impression  The patient was encouraged to get up and into recliner, required 2 assist today. The patient resides in LTC. Plans to return to SNF. Pt admitted with above diagnosis. Pt currently with functional limitations due to the deficits listed below (see PT Problem List). Pt will benefit from skilled PT to increase their independence and safety with mobility to allow discharge to the venue listed below.       Follow Up Recommendations SNF;Supervision/Assistance - 24 hour (pt is longterm resident snf)    Equipment Recommendations  None recommended by PT    Recommendations for Other Services       Precautions / Restrictions Precautions Precautions: Fall Restrictions Weight Bearing Restrictions: No      Mobility  Bed Mobility Overal bed mobility: Needs Assistance Bed Mobility: Supine to Sit     Supine to sit: Mod assist     General bed mobility comments: extra time  Transfers Overall transfer level: Needs assistance Equipment used: Rolling walker (2 wheeled) Transfers: Sit to/from [facility] Sit to Stand: +2 physical assistance;Mod assist Stand pivot transfers: +2 physical assistance;Mod assist       General transfer comment: patient stood x 1 and then sat back down, Stood again with assist to power up  then to take small steps to reclinerwith 2 assist.  Ambulation/Gait                Stairs            Wheelchair Mobility    Modified Rankin (Stroke Patients Only)        Balance                                             Pertinent Vitals/Pain Pain Assessment: No/denies pain    Home Living Family/patient expects to be discharged to:: Skilled nursing facility                 Additional Comments: Resident of [facility] the last 5 years    Prior Function Level of Independence: Needs assistance   Gait / Transfers Assistance Needed: Pt reports she ambulates with RW but recently has been performing mobility via w/c           Hand Dominance        Extremity/Trunk Assessment   Upper Extremity Assessment Upper Extremity Assessment: Generalized weakness    Lower Extremity Assessment Lower Extremity Assessment: Generalized weakness    Cervical / Trunk Assessment Cervical / Trunk Assessment: Kyphotic  Communication   Communication: No difficulties  Cognition Arousal/Alertness: Awake/alert Behavior During Therapy: WFL for tasks assessed/performed Overall Cognitive Status: Within Functional Limits for tasks assessed                                 General Comments: oriented to place not date      General Comments      Exercises     Assessment/Plan    PT Assessment Patient  needs continued PT services  PT Problem List Decreased strength;Decreased range of motion;Decreased activity tolerance;Decreased mobility;Decreased knowledge of precautions;Decreased safety awareness;Decreased knowledge of use of DME;Decreased cognition       PT Treatment Interventions DME instruction;Gait training;Functional mobility training;Therapeutic activities;Therapeutic exercise;Patient/family education    PT Goals (Current goals can be found in the Care Plan section)  Acute Rehab PT Goals Patient Stated Goal: agreed to get up to the chair.  PT Goal Formulation: With patient Time For Goal Achievement: 12/02/16 Potential to Achieve Goals: Fair    Frequency Min 2X/week   Barriers to discharge         Co-evaluation               AM-PAC PT "6 Clicks" Daily Activity  Outcome Measure Difficulty turning over in bed (including adjusting bedclothes, sheets and blankets)?: A Lot Difficulty moving from lying on back to sitting on the side of the bed? : A Lot Difficulty sitting down on and standing up from a chair with arms (e.g., wheelchair, bedside commode, etc,.)?: A Lot Help needed moving to and from a bed to chair (including a wheelchair)?: A Lot Help needed walking in hospital room?: A Lot Help needed climbing 3-5 steps with a railing? : Total 6 Click Score: 11    End of Session   Activity Tolerance: Patient tolerated treatment well Patient left: in chair;with call bell/phone within reach;with chair alarm set;with family/visitor present Nurse Communication: Mobility status PT Visit Diagnosis: Difficulty in walking, not elsewhere classified (R26.2)    Time: 1448-1856 PT Time Calculation (min) (ACUTE ONLY): 13 min   Charges:   PT Evaluation $PT Eval Low Complexity: 1 Procedure     PT G CodesTresa Endo PT [phone]   Claretha Cooper 11/18/2016, 1:13 PM

## 2016-11-18 NOTE — Care Management Note (Signed)
Case Management Note  Patient Details  Name: [name] MRN: [id number] Date of Birth: October 15, 1929  Subjective/Objective: Transfer from SDU.From SNF-[facility].CSW already following for return SNF.                   Action/Plan:d/c SNF.   Expected Discharge Date:   (unknown)               Expected Discharge Plan:  Skilled Nursing Facility  In-House Referral:  Clinical Social Work  Discharge planning Services  CM Consult  Post Acute Care Choice:    Choice offered to:     DME Arranged:    DME Agency:     HH Arranged:    [place] Agency:     Status of Service:  Completed, signed off  If discussed at [employer], dates discussed:    Additional Comments:  Dessa Phi, RN 11/18/2016, 1:11 PM

## 2016-11-18 NOTE — Progress Notes (Signed)
PROGRESS NOTE    [name]  YQM:[id number] DOB: 1929-09-20 DOA: 11/13/2016 PCP: [name], MD   Chief Complaint  Patient presents with  . Fever  . Shortness of Breath    Brief Narrative:  HPI on 11/13/2016 by Dr. Karmen Bongo [name] is a 83 y.o. female with medical history significant of hypertrophic cardiomyopathy, chronic diastolic heart failure, CAD, hyperlipidemia, and essential hypertension, rheumatoid arthritis on chronic prednisone and hypothyroidism presenting with cough yesterday, worse all night.  Unable to eat.  Low-grade fever today with wheezing.  PA was at the facility and was concerned that she had congestion.  Uncertain how high the fever was, maybe 100.  +SOB.  Non-productive cough.   She was hospitalized from 3/19-22 with sepsis due to UTI and left wrist celluliitis.  Interim history Admitted with possible viral illness vs pneumonia. Given IVF for elevated lactic acid and sepsis. Developed acute diastolic heart failure. Place on IV lasix, improving.   Assessment & Plan   Sepsis secondary to possible viral illness -Presented with Tachycardia, tachypnea, leukocytosis -Blood cultures show no growth to date -Chest x-ray and UA unremarkable for infection -Patient started on vancomycin and cefepime -Discontinued vancomycin today as patient had negative MRSA screen -Strep pneumonia urine antigen negative -Continue ipratropium and supportive care -Leukocytosis resolved, VSS stable. -Will discontinue cefepime today as patient has received 5 days of antibiotics.   Acute on chronic hypoxic respiratory failure/ Acute diastolic heart failure -Patient is on chronic home oxygen, 3L -Patient did receive 3L of IVF on admission given her sepsis and elevated lactic acid -Patient presented with wheezing, however that is likely multifactorial- due to viral illness vs CHF. Wheezing has now improved. -BNP 1009.7 -HCTZ held -Placed on IV lasix 40mg  BID, and tapered to  40mg  daily -placed foley catheter for accurate output given incontinence and aggressive diuresis -Urine output over past 24 hours 2200 cc (>5L output) -Doubt daily weights are accurate -Continue to monitor intake, output, daily weights -Will place on PO lasix today. Patient may benefit from a low dose of PO lasix every day or every other day instead of HCTZ.   Lactic acidosis -Thought to be secondary to sepsis versus nebulizer treatments with albuterol -PCCM consulted and appreciated and has signed off -lactic acid peaked at 8.7, down to 1.6 -was given IVF- discontinued IVF  Elevated troponin -peaked at 0.64, trending downward 0.43 -Echocardiogram: Showed EF of 52-84%, grade 1 diastolic dysfunction, mild MR and MS, severe LAE, mild to moderate TR with moderately elevated pulm pressure -Likely secondary to sepsis/demand ischemia  Essential hypertension -Continue metoprolol -Currently on IV lasix -HCTZ held on admission due to sepsis -Continue IV hydralazine PRN  Chronic kidney disease, stage III -Creatinine currently 1.54 (baseline around 1.5) -Continue to monitor BMP closely given patient has been diuresing.   Rheumatoid arthritis -Continue prednisone  Hypothyroidism -Continue Synthroid -TSH 0.353 (WNL)  History of DVT -Continue Eliqius, dose was reduced to 2.5 mg twice a day  Hyperglycemia -Patient is on chronic steroids -last hemoglobin A1c 6.4 in July 2017  Hypokalemia/hypomagnesemia -Potassium 4 after supplementation -Magnesium now 1.9 after supplementation   Deconditioning -PT consulted and pending -Suspect patient does have some physical deconditioning given that she's been sedentary for the past several days given her acute illness  Dementia  -per daughter -patient currently AAOx3  DVT Prophylaxis  Eliquis  Code Status: Full  Family Communication: None at bedside. Daughter via phone  Disposition Plan: Admitted. Pending PT consult. Will transition to  PO lasix.  Consultants PCCM  Procedures  Echocardiogram  Antibiotics   Anti-infectives    Start     Dose/Rate Route Frequency Ordered Stop   11/14/16 2200  ceFEPIme (MAXIPIME) 1 g in dextrose 5 % 50 mL IVPB     1 g 100 mL/hr over 30 Minutes Intravenous Every 24 hours 11/14/16 0144     11/14/16 1800  vancomycin (VANCOCIN) 500 mg in sodium chloride 0.9 % 100 mL IVPB  Status:  Discontinued     500 mg 100 mL/hr over 60 Minutes Intravenous Every 24 hours 11/14/16 0144 11/14/16 1015   11/13/16 2315  ceFEPIme (MAXIPIME) 2 g in dextrose 5 % 50 mL IVPB     2 g 100 mL/hr over 30 Minutes Intravenous  Once 11/13/16 2311 11/14/16 0137   11/13/16 2315  vancomycin (VANCOCIN) IVPB 1000 mg/200 mL premix  Status:  Discontinued     1,000 mg 200 mL/hr over 60 Minutes Intravenous  Once 11/13/16 2311 11/13/16 2317   11/13/16 1845  piperacillin-tazobactam (ZOSYN) IVPB 3.375 g     3.375 g 100 mL/hr over 30 Minutes Intravenous  Once 11/13/16 1833 11/13/16 1900   11/13/16 1815  piperacillin-tazobactam (ZOSYN) IVPB 3.375 g  Status:  Discontinued     3.375 g 100 mL/hr over 30 Minutes Intravenous  Once 11/13/16 1805 11/13/16 1833   11/13/16 1815  vancomycin (VANCOCIN) IVPB 1000 mg/200 mL premix     1,000 mg 200 mL/hr over 60 Minutes Intravenous  Once 11/13/16 1805 11/13/16 2050      Subjective:   Vanessa Cisco seen and examined today.  Patient feels her breathing has improved. Denies any current wheezing. Has occasional cough. Denies chest pain, abdominal pain, nausea or vomiting, diarrhea or constipation.    Objective:   Vitals:   11/17/16 1500 11/17/16 1805 11/17/16 2134 11/18/16 0453  BP:  (!) 155/81 (!) 143/65 (!) 167/80  Pulse:  72 65 66  Resp:  20 20 20   Temp:  98.1 F (36.7 C) 98.2 F (36.8 C) 98.3 F (36.8 C)  TempSrc:  Oral Oral Oral  SpO2:  100% 100% 99%  Weight: 58.7 kg (129 lb 6.6 oz)   60.7 kg (133 lb 13.1 oz)  Height:        Intake/Output Summary (Last 24 hours) at 11/18/16  1053 Last data filed at 11/18/16 0600  Gross per 24 hour  Intake              370 ml  Output             1815 ml  Net            -1445 ml   Filed Weights   11/16/16 1032 11/17/16 1500 11/18/16 0453  Weight: 60.6 kg (133 lb 9.6 oz) 58.7 kg (129 lb 6.6 oz) 60.7 kg (133 lb 13.1 oz)   Exam  General: Well developed, well nourished, no apparent distress  HEENT: NCAT, mucous membranes moist.   Cardiovascular: S1 S2 auscultated, RRR, no murmurs  Respiratory: Diminished breath sounds, however, clear.  Abdomen: Soft, nontender, nondistended, + bowel sounds  Extremities: warm dry without cyanosis clubbing or edema  Neuro: AAOx3, nonfocal  Psych: Normal affect and demeanor, pleasant    Data Reviewed: I have personally reviewed following labs and imaging studies  CBC:  Recent Labs Lab 11/13/16 1655 11/14/16 0516 11/15/16 0741 11/16/16 0753 11/17/16 0327 11/18/16 0530  WBC 12.8* 11.6* 22.8* 14.7* 10.5 10.4  NEUTROABS 10.8* 10.5*  --   --   --   --  HGB 11.7* 10.0* 10.2* 9.5* 9.4* 9.7*  HCT 37.5 31.2* 31.8* 29.9* 30.6* 31.2*  MCV 91.5 91.8 91.1 91.4 91.3 90.2  PLT 173 147* 163 136* 134* 258*   Basic Metabolic Panel:  Recent Labs Lab 11/14/16 0516 11/15/16 0741 11/16/16 0753 11/17/16 0327 11/18/16 0530  NA 141 141 140 142 140  K 4.0 3.4* 3.3* 3.6 4.0  CL 114* 114* 115* 109 106  CO2 19* 18* 21* 25 26  GLUCOSE 170* 132* 91 107* 89  BUN 26* 26* 26* 31* 29*  CREATININE 1.34* 1.39* 1.16* 1.50* 1.54*  CALCIUM 7.4* 7.5* 7.2* 7.5* 7.6*  MG  --   --  1.3*  --  1.9   GFR: Estimated Creatinine Clearance: 25.5 mL/min (A) (by C-G formula based on SCr of 1.54 mg/dL (H)). Liver Function Tests:  Recent Labs Lab 11/14/16 0203  AST 94*  ALT 66*  ALKPHOS 48  BILITOT 0.4  PROT 5.6*  ALBUMIN 2.8*   No results for input(s): LIPASE, AMYLASE in the last 168 hours. No results for input(s): AMMONIA in the last 168 hours. Coagulation Profile:  Recent Labs Lab  11/13/16 2336  INR 1.33   Cardiac Enzymes:  Recent Labs Lab 11/14/16 0203 11/14/16 0818 11/14/16 1401 11/15/16 0741  TROPONINI 0.16* 0.62* 0.64* 0.43*   BNP (last 3 results) No results for input(s): PROBNP in the last 8760 hours. HbA1C: No results for input(s): HGBA1C in the last 72 hours. CBG: No results for input(s): GLUCAP in the last 168 hours. Lipid Profile: No results for input(s): CHOL, HDL, LDLCALC, TRIG, CHOLHDL, LDLDIRECT in the last 72 hours. Thyroid Function Tests: No results for input(s): TSH, T4TOTAL, FREET4, T3FREE, THYROIDAB in the last 72 hours. Anemia Panel: No results for input(s): VITAMINB12, FOLATE, FERRITIN, TIBC, IRON, RETICCTPCT in the last 72 hours. Urine analysis:    Component Value Date/Time   COLORURINE YELLOW 11/13/2016 2010   APPEARANCEUR CLEAR 11/13/2016 2010   APPEARANCEUR Hazy 03/06/2013 1013   LABSPEC 1.018 11/13/2016 2010   LABSPEC 1.013 03/06/2013 1013   PHURINE 5.0 11/13/2016 2010   GLUCOSEU 150 (A) 11/13/2016 2010   GLUCOSEU Negative 03/06/2013 1013   HGBUR NEGATIVE 11/13/2016 2010   BILIRUBINUR NEGATIVE 11/13/2016 2010   BILIRUBINUR Negative 03/06/2013 [place] 11/13/2016 2010   PROTEINUR 30 (A) 11/13/2016 2010   UROBILINOGEN 0.2 12/18/2012 1203   NITRITE NEGATIVE 11/13/2016 2010   LEUKOCYTESUR NEGATIVE 11/13/2016 2010   LEUKOCYTESUR 3+ 03/06/2013 1013   Sepsis Labs: @LABRCNTIP (procalcitonin:4,lacticidven:4)  ) Recent Results (from the past 240 hour(s))  Culture, blood (x 2)     Status: None (Preliminary result)   Collection Time: 11/13/16 11:12 PM  Result Value Ref Range Status   Specimen Description BLOOD LEFT ANTECUBITAL  Final   Special Requests   Final    BOTTLES DRAWN AEROBIC AND ANAEROBIC Blood Culture adequate volume   Culture   Final    NO GROWTH 3 DAYS Performed at [facility], [place] [address] [place], [place] [address]    Report Status PENDING  Incomplete  Culture, blood (x 2)      Status: None (Preliminary result)   Collection Time: 11/13/16 11:17 PM  Result Value Ref Range Status   Specimen Description BLOOD LEFT HAND  Final   Special Requests IN PEDIATRIC BOTTLE Blood Culture adequate volume  Final   Culture   Final    NO GROWTH 3 DAYS Performed at [facility], [place] [address] [place], [place] [address]  Report Status PENDING  Incomplete  MRSA PCR Screening     Status: None   Collection Time: 11/13/16 11:41 PM  Result Value Ref Range Status   MRSA by PCR NEGATIVE NEGATIVE Final    Comment:        The GeneXpert MRSA Assay (FDA approved for NASAL specimens only), is one component of a comprehensive MRSA colonization surveillance program. It is not intended to diagnose MRSA infection nor to guide or monitor treatment for MRSA infections.       Radiology Studies: No results found.   Scheduled Meds: . apixaban  2.5 mg Oral BID  . furosemide  40 mg Intravenous Daily  . HYDROcodone-acetaminophen  1 tablet Oral Daily  . levothyroxine  25 mcg Oral QAC breakfast  . loratadine  10 mg Oral Daily  . LORazepam  0.5 mg Oral QHS  . meclizine  12.5 mg Oral BID  . metoprolol tartrate  50 mg Oral BID  . montelukast  10 mg Oral QHS  . multivitamin with minerals  1 tablet Oral Daily  . pantoprazole  40 mg Oral Daily  . polyvinyl alcohol  1 drop Both Eyes TID  . predniSONE  10 mg Oral QAC breakfast  . rosuvastatin  20 mg Oral QPM  . sertraline  50 mg Oral q morning - 10a   Continuous Infusions: . ceFEPime (MAXIPIME) IV Stopped (11/17/16 2249)     LOS: 5 days   Time Spent in minutes   30 minutes  Jada Fass D.O. on 11/18/2016 at 10:53 AM  Between 7am to 7pm - Pager - [phone]  After 7pm go to [email or web address] - password [email or web address]  And look for the night coverage person covering for me after hours  [facility] Office  [phone]

## 2016-11-18 NOTE — Care Management Important Message (Signed)
Important Message  Patient Details  Name: [name] MRN: [id number] Date of Birth: 23-Jun-1930   Medicare Important Message Given:  Yes    Kerin Salen 11/18/2016, 11:08 AMImportant Message  Patient Details  Name: [name] MRN: [id number] Date of Birth: [birth year]   Medicare Important Message Given:  Yes    Kerin Salen 11/18/2016, 11:08 AM

## 2016-11-19 DIAGNOSIS — R509 Fever, unspecified: Secondary | ICD-10-CM | POA: Diagnosis not present

## 2016-11-19 DIAGNOSIS — Z86718 Personal history of other venous thrombosis and embolism: Secondary | ICD-10-CM | POA: Diagnosis not present

## 2016-11-19 DIAGNOSIS — M0689 Other specified rheumatoid arthritis, multiple sites: Secondary | ICD-10-CM | POA: Diagnosis not present

## 2016-11-19 DIAGNOSIS — N183 Chronic kidney disease, stage 3 (moderate): Secondary | ICD-10-CM | POA: Diagnosis not present

## 2016-11-19 DIAGNOSIS — E039 Hypothyroidism, unspecified: Secondary | ICD-10-CM | POA: Diagnosis not present

## 2016-11-19 DIAGNOSIS — F329 Major depressive disorder, single episode, unspecified: Secondary | ICD-10-CM | POA: Diagnosis not present

## 2016-11-19 DIAGNOSIS — F411 Generalized anxiety disorder: Secondary | ICD-10-CM | POA: Diagnosis not present

## 2016-11-19 DIAGNOSIS — N390 Urinary tract infection, site not specified: Secondary | ICD-10-CM | POA: Diagnosis not present

## 2016-11-19 DIAGNOSIS — R2689 Other abnormalities of gait and mobility: Secondary | ICD-10-CM | POA: Diagnosis not present

## 2016-11-19 DIAGNOSIS — J4520 Mild intermittent asthma, uncomplicated: Secondary | ICD-10-CM | POA: Diagnosis not present

## 2016-11-19 DIAGNOSIS — R488 Other symbolic dysfunctions: Secondary | ICD-10-CM | POA: Diagnosis not present

## 2016-11-19 DIAGNOSIS — A419 Sepsis, unspecified organism: Secondary | ICD-10-CM | POA: Diagnosis not present

## 2016-11-19 DIAGNOSIS — R739 Hyperglycemia, unspecified: Secondary | ICD-10-CM

## 2016-11-19 DIAGNOSIS — R1312 Dysphagia, oropharyngeal phase: Secondary | ICD-10-CM | POA: Diagnosis not present

## 2016-11-19 DIAGNOSIS — I6990 Unspecified sequelae of unspecified cerebrovascular disease: Secondary | ICD-10-CM | POA: Diagnosis not present

## 2016-11-19 DIAGNOSIS — J129 Viral pneumonia, unspecified: Secondary | ICD-10-CM | POA: Diagnosis not present

## 2016-11-19 DIAGNOSIS — R6520 Severe sepsis without septic shock: Secondary | ICD-10-CM | POA: Diagnosis not present

## 2016-11-19 DIAGNOSIS — D419 Neoplasm of uncertain behavior of unspecified urinary organ: Secondary | ICD-10-CM | POA: Diagnosis not present

## 2016-11-19 DIAGNOSIS — L03114 Cellulitis of left upper limb: Secondary | ICD-10-CM | POA: Diagnosis not present

## 2016-11-19 DIAGNOSIS — I421 Obstructive hypertrophic cardiomyopathy: Secondary | ICD-10-CM | POA: Diagnosis not present

## 2016-11-19 DIAGNOSIS — M069 Rheumatoid arthritis, unspecified: Secondary | ICD-10-CM | POA: Diagnosis not present

## 2016-11-19 DIAGNOSIS — M6281 Muscle weakness (generalized): Secondary | ICD-10-CM | POA: Diagnosis not present

## 2016-11-19 LAB — CULTURE, BLOOD (ROUTINE X 2)
Culture: NO GROWTH
Culture: NO GROWTH
Special Requests: ADEQUATE
Special Requests: ADEQUATE

## 2016-11-19 LAB — BASIC METABOLIC PANEL
Anion gap: 7 (ref 5–15)
BUN: 34 mg/dL — ABNORMAL HIGH (ref 6–20)
CALCIUM: 8.3 mg/dL — AB (ref 8.9–10.3)
CO2: 31 mmol/L (ref 22–32)
Chloride: 102 mmol/L (ref 101–111)
Creatinine, Ser: 1.63 mg/dL — ABNORMAL HIGH (ref 0.44–1.00)
GFR, EST AFRICAN AMERICAN: 32 mL/min — AB (ref >60)
GFR, EST NON AFRICAN AMERICAN: 28 mL/min — AB (ref >60)
Glucose, Bld: 100 mg/dL — ABNORMAL HIGH (ref 65–99)
Potassium: 4.4 mmol/L (ref 3.5–5.1)
Sodium: 140 mmol/L (ref 135–145)

## 2016-11-19 LAB — CBC WITH DIFFERENTIAL/PLATELET
BASOS PCT: 0 %
Basophils Absolute: 0 10*3/uL (ref 0.0–0.1)
EOS ABS: 0.3 10*3/uL (ref 0.0–0.7)
Eosinophils Relative: 3 %
HCT: 32.5 % — ABNORMAL LOW (ref 36.0–46.0)
Hemoglobin: 10.1 g/dL — ABNORMAL LOW (ref 12.0–15.0)
Lymphocytes Relative: 18 %
Lymphs Abs: 2.1 10*3/uL (ref 0.7–4.0)
MCH: 27.9 pg (ref 26.0–34.0)
MCHC: 31.1 g/dL (ref 30.0–36.0)
MCV: 89.8 fL (ref 78.0–100.0)
MONO ABS: 1 10*3/uL (ref 0.1–1.0)
Monocytes Relative: 9 %
NEUTROS ABS: 8.2 10*3/uL — AB (ref 1.7–7.7)
Neutrophils Relative %: 70 %
PLATELETS: 184 10*3/uL (ref 150–400)
RBC: 3.62 MIL/uL — ABNORMAL LOW (ref 3.87–5.11)
RDW: 14.5 % (ref 11.5–15.5)
WBC: 11.6 10*3/uL — ABNORMAL HIGH (ref 4.0–10.5)

## 2016-11-19 LAB — HEPATIC FUNCTION PANEL
ALK PHOS: 53 U/L (ref 38–126)
ALT: 35 U/L (ref 14–54)
AST: 35 U/L (ref 15–41)
Albumin: 2.7 g/dL — ABNORMAL LOW (ref 3.5–5.0)
BILIRUBIN INDIRECT: 0.4 mg/dL (ref 0.3–0.9)
BILIRUBIN TOTAL: 0.6 mg/dL (ref 0.3–1.2)
Bilirubin, Direct: 0.2 mg/dL (ref 0.1–0.5)
Total Protein: 5.8 g/dL — ABNORMAL LOW (ref 6.5–8.1)

## 2016-11-19 LAB — MAGNESIUM: Magnesium: 1.8 mg/dL (ref 1.7–2.4)

## 2016-11-19 LAB — PHOSPHORUS: Phosphorus: 3.2 mg/dL (ref 2.5–4.6)

## 2016-11-19 MED ORDER — FUROSEMIDE 40 MG PO TABS: 40.0000 mg | ORAL_TABLET | Freq: Every day | ORAL | 0 refills | Status: DC

## 2016-11-19 MED ORDER — SACCHAROMYCES BOULARDII 250 MG PO CAPS: 250.0000 mg | ORAL_CAPSULE | Freq: Two times a day (BID) | ORAL | 0 refills | Status: AC

## 2016-11-19 MED ORDER — APIXABAN 2.5 MG PO TABS: 2.5000 mg | ORAL_TABLET | Freq: Two times a day (BID) | ORAL | 0 refills | Status: AC

## 2016-11-19 MED ORDER — LORAZEPAM 0.5 MG PO TABS: ORAL_TABLET | ORAL | 0 refills | Status: DC

## 2016-11-19 MED ORDER — HYDROCODONE-ACETAMINOPHEN 5-325 MG PO TABS: 1.0000 | ORAL_TABLET | Freq: Every day | ORAL | 0 refills | Status: DC

## 2016-11-19 NOTE — Progress Notes (Signed)
CSW spoke with patient at bedside, patient confirmed plan to return to [facility]. Patient requested that CSW contact her daughter Edwin Cap May) to inquire about patient's transportation back to SNF. Patient's daughter reported that patient will need [facility] for transportation back to SNF. CSW sent discharge summary to facility and confirmed patient's ability to return. [facility] contacted, family aware and packet complete provided to patient's RN. CSW signing off, please consult if new needs arise.   Abundio Miu, [place] Social Worker [facility] Cell#: [phone]

## 2016-11-19 NOTE — Progress Notes (Signed)
Pt to be discharged back to [facility] this afternoon. Benjamine Sprague RN accepting report for the facility. VSS and no acute changes noted with Pt's assessment at time of discharge

## 2016-11-19 NOTE — Progress Notes (Signed)
Nutrition Follow-up  DOCUMENTATION CODES:   Not applicable  INTERVENTION:  - Continue to encourage PO intakes of meals and beverages. - RD will continue to monitor for needs.  NUTRITION DIAGNOSIS:   Inadequate oral intake related to poor appetite as evidenced by per patient/family report, meal completion < 50%. -improving  GOAL:   Patient will meet greater than or equal to 90% of their needs -beginning to minimally meet on average.  MONITOR:   PO intake, Weight trends, Labs  ASSESSMENT:   83 y.o. female with medical history significant of hypertrophic cardiomyopathy, chronic diastolic heart failure, CAD, hyperlipidemia, and essential hypertension, rheumatoid arthritis on chronic prednisone and hypothyroidism presenting with cough yesterday, worse all night.  Unable to eat.  Low-grade fever today with wheezing.  PA was at the facility and was concerned that she had congestion.  Uncertain how high the fever was, maybe 100.  +SOB.  Non-productive cough.    5/23 Pt's appetite continues to slowly improve. She is consuming ~50% of meals on average. Noted weight fluctuations (125-133 lbs) since admission and Dr. Aldine Contes note yesterday states that she felt weights were not accurate. Will continue to monitor weight trends closely.  Medications reviewed; 40 mg oral Lasix/day, 25 mcg oral Synthroid/day, daily multivitamin with minerals, 40 mg oral Protonix/day, 10 mg Deltasone/day. Labs reviewed; BUN: 34 mg/dL, creatinine: 1.63 mg/dL, Ca: 8.3 mg/dL, GFR: 32 mL/min.    5/21 - Per chart review, pt consumed 10% of breakfast, 15% of lunch, and 25% of dinner on 5/18 and 35% of dinner on 5/19; no other intakes documented since 5/19.  - She reports that she was at [facility] and did not like the food there so she was not eating well.  - She likes the food here better but appetite was poor on admission and is slowly increasing. - She states 100% completion of pancakes and 50% coffee for  breakfast this AM.  - Pt not interested in oral nutrition supplements at this time.  - She denies any nutrition-related needs or questions. - Physical assessment shows no muscle and no fat wasting; mild edema noted.  - Per chart review, weight has been relatively stable since November.    Diet Order:  Diet regular Room service appropriate? Yes; Fluid consistency: Thin; Fluid restriction: 1500 mL Fluid  Skin:  Reviewed, no issues  Last BM:  5/21  Height:   Ht Readings from Last 1 Encounters:  11/13/16 5\' 6"  (1.676 m)    Weight:   Wt Readings from Last 1 Encounters:  11/19/16 130 lb 6.4 oz (59.1 kg)    Ideal Body Weight:  59.09 kg  BMI:  Body mass index is 21.05 kg/m.  Estimated Nutritional Needs:   Kcal:  1395-1575 (23-26 kcal/kg)  Protein:  55-65 grams  Fluid:  1.5-1.7 L/day  EDUCATION NEEDS:   No education needs identified at this time    Jarome Matin, MS, RD, LDN, CNSC Inpatient Clinical Dietitian Pager # [phone] After hours/weekend pager # [phone]

## 2016-11-19 NOTE — Discharge Summary (Signed)
Physician Discharge Summary  [name] HWK:[id number] DOB: [birth year] DOA: 11/13/2016  PCP: [name], MD  Admit date: 11/13/2016 Discharge date: 11/19/2016  Admitted From: SNF Disposition:  SNF  Recommendations for Outpatient Follow-up:  1. Follow up with PCP in 1-2 weeks 2. Repeat CXR in 3 weeks 3. Please obtain CMP/CBC in AM and also in one week  Home Health: No Equipment/Devices: None  Discharge Condition: Stable CODE STATUS: FULL CODE Diet recommendation: Heart Healthy  Brief/Interim Summary: [name] is a 83 y.o. female with medical history significant of hypertrophic cardiomyopathy, chronic diastolic heart failure, CAD, hyperlipidemia, and essential hypertension, rheumatoid arthritis on chronic prednisone and hypothyroidism as well as other comorbids presenting with cough yesterday, worse all night. She was unable to eat and had Low-grade fever on admission with wheezing.  PA was at the facility and was concerned that she had congestion.  Uncertain how high the fever was, maybe 100.  +SOB.  Non-productive cough.  She was hospitalized from 3/19-22 with sepsis due to UTI and left wrist celluliitis. She was admitted with Sepsis and admitted for a possible HCAP but CXR was unrevealing so she was diagnosed with a viral illness. She had a lactic acid level of 8.7 on admission and was volume rehydrated with IVF and subsequently went into Heart Failure. She was steadily diuresed and has improved tremendously. She is s/p treatment with Abx for 5 days and improved. She was deemed medically stable to be D/C'd back to SNF and will need to have her labs repeated in AM and within 1 week.   Discharge Diagnoses:  Principal Problem:   Sepsis ([place]) Active Problems:   RA (rheumatoid arthritis) (HCC)   Hypothyroidism   HCAP (healthcare-associated pneumonia)   CKD (chronic kidney disease) stage 3, GFR 30-59 ml/min   History of DVT (deep vein thrombosis)   Hyperglycemia  Sepsis  secondary to possible viral illness -Sepsis Physiology improved -Presented with Tachycardia, tachypnea, leukocytosis -Blood cultures show no growth to date -Chest x-ray and UA unremarkable for infection -Patient was started on vancomycin and cefepime -Discontinued vancomycin the day after admission as patient had negative MRSA screen -Strep pneumonia urine antigen negative -Continue ipratropium and supportive care -Leukocytosis resolved (went from 11.6 -> 22.8 and now has gone to 11.6, VSS stable. -Cefepime discontinued as patient has received 5 days of antibiotic  -Repeat CBC in AM   Acute on Chronic Hypoxic Respiratory Failure/ Acute on Chronic Diastolic Heart Failure, Stable -Patient is on chronic home oxygen, 3L -Patient did receive 3L of IVF on admission given her sepsis and elevated lactic acid -Patient presented with wheezing, however that is likely multifactorial- due to viral illness vs CHF. Wheezing has now improved. -BNP 1009.7 -HCTZ held -Placed on IV lasix 23m BID, and tapered to 40 mg  po aily -placed foley catheter for accurate output given incontinence and aggressive diuresis; Will D/C -Doubt daily weights are accurate -Continue to monitor intake, output, daily weights -Will place on PO lasix today. Patient may benefit from a low dose of PO lasix every day or every other day instead of HCTZ. Will start patient on 40 mg po daily and defer to Physician at SNF to change -Repeat CMP in AM   Lactic acidosis -Thought to be secondary to sepsis versus nebulizer treatments with albuterol -PCCM consulted and appreciated and has signed off -lactic acid peaked at 8.7, down to 1.6 -was given IVF- discontinued IVF  Elevated troponin -peaked at 0.64, trending downward 0.43 -Echocardiogram: Showed EF of 65-70%,  grade 1 diastolic dysfunction, mild MR and MS, severe LAE, mild to moderate TR with moderately elevated pulm pressure -Likely secondary to sepsis/demand  ischemia -Continue to Monitor for S/Sx of Chest Pain  Essential hypertension -Continue metoprolol -IV Lasix now changed to po 40 mg Daily -HCTZ held on admission due to sepsis and will D/C -Continue IV hydralazine PRN  Chronic kidney disease, stage III -Creatinine currently 1.63 (baseline around 1.5) -Continue to monitor CMP closely given patient has been diuresing. -Repeat CMP in AM and within 1 wee  Rheumatoid arthritis -Continue Home Prednisone  Hypothyroidism -Continue Synthroid -TSH 0.353 (WNL)  History of DVT -Continue Eliqius, dose was reduced to 2.5 mg twice a day given Kidney Fxn and Age  Hyperglycemia -Patient is on chronic steroids -last hemoglobin A1c 6.4 in July 2017 -Continue to Monitor  Hypokalemia/hypomagnesemia -Potassium 4.4 after supplementation -Magnesium now 1.8 after supplementation  -Repeat Mag and K+ as an outpatient  Deconditioning -PT consulted and recommend SNF -Suspect patient does have some physical deconditioning given that she's been sedentary for the past several days given her acute illness  Dementia  -per daughter -patient currently AAOx3 -continue to follow in outpatient setting  Mild Leukocytosis -Chronically on Prednisone -Patient's WBC went from 10.4 -> 11.6 -Continue to Monitor for S/Sx of Infection as patient is Afebrile -D/C foley and continue UTI prophylaxis -Repeat CBC in AM and within 1 week  Discharge Instructions  Discharge Instructions    Call MD for:  difficulty breathing, headache or visual disturbances    Complete by:  As directed    Call MD for:  extreme fatigue    Complete by:  As directed    Call MD for:  persistant dizziness or light-headedness    Complete by:  As directed    Call MD for:  persistant nausea and vomiting    Complete by:  As directed    Call MD for:  severe uncontrolled pain    Complete by:  As directed    Call MD for:  temperature >100.4    Complete by:  As directed    Diet  - low sodium heart healthy    Complete by:  As directed    Discharge instructions    Complete by:  As directed    Follow up Care at SNF. Take all medications as prescribed. If symptoms change or worsen please return to the ED for evaluation.   Increase activity slowly    Complete by:  As directed      Allergies as of 11/19/2016      Reactions   Codeine Swelling   Sulfa Antibiotics Other (See Comments)   GI upset   Biphosphate Other (See Comments)   Morphine And Related Other (See Comments)   Percocet [oxycodone-acetaminophen] Other (See Comments)   Plavix [clopidogrel Bisulfate] Itching, Rash      Medication List    STOP taking these medications   hydrochlorothiazide 12.5 MG tablet Commonly known as:  HYDRODIURIL     TAKE these medications   ACT DRY MOUTH Lozg Use as directed in the mouth or throat as needed (for dry mouth).   apixaban 2.5 MG Tabs tablet Commonly known as:  ELIQUIS Take 1 tablet (2.5 mg total) by mouth 2 (two) times daily. What changed:  medication strength  how much to take  additional instructions   BIOFREEZE 4 % Gel Generic drug:  Menthol (Topical Analgesic) Apply 1 application topically 2 (two) times daily. For wrist pain   calcium-vitamin D 500-200  MG-UNIT tablet Commonly known as:  OSCAL WITH D Take 1 tablet by mouth 3 (three) times daily.   carboxymethylcellulose 1 % ophthalmic solution Apply 1 drop to eye 3 (three) times daily. Both eyes   CERTAVITE/ANTIOXIDANTS PO Take 1 tablet by mouth daily.   cetirizine 10 MG tablet Commonly known as:  ZYRTEC Take 10 mg by mouth daily.   Cranberry 200 MG Caps Take 2 capsules by mouth 2 (two) times daily.   denosumab 60 MG/ML Soln injection Commonly known as:  PROLIA Inject 60 mg into the skin every 6 (six) months. Administer in upper arm, thigh, or abdomen   esomeprazole 40 MG capsule Commonly known as:  NEXIUM Take 40 mg by mouth 2 (two) times daily before a meal.   ferrous sulfate  325 (65 FE) MG tablet Take 325 mg by mouth daily.   furosemide 40 MG tablet Commonly known as:  LASIX Take 1 tablet (40 mg total) by mouth daily. Start taking on:  11/20/2016   guaiFENesin 100 MG/5ML liquid Commonly known as:  ROBITUSSIN Take 200 mg by mouth 4 (four) times daily as needed for cough.   HYDROcodone-acetaminophen 5-325 MG tablet Commonly known as:  NORCO/VICODIN Take 1 tablet by mouth daily.   ipratropium 0.03 % nasal spray Commonly known as:  ATROVENT Place 2 sprays into both nostrils daily as needed (For asthma).   ipratropium-albuterol 0.5-2.5 (3) MG/3ML Soln Commonly known as:  DUONEB Take 3 mLs by nebulization every 8 (eight) hours as needed (wheezing).   levothyroxine 25 MCG tablet Commonly known as:  SYNTHROID, LEVOTHROID Take 1 tablet (25 mcg total) by mouth daily before breakfast.   lidocaine 5 % Commonly known as:  LIDODERM Place 1 patch onto the skin daily. Remove & Discard patch within 12 hours or as directed by MD   LORazepam 0.5 MG tablet Commonly known as:  ATIVAN Take one tablet by mouth every night at bedtime for anxiety   meclizine 12.5 MG tablet Commonly known as:  ANTIVERT Take 12.5 mg by mouth 2 (two) times daily.   metoprolol tartrate 50 MG tablet Commonly known as:  LOPRESSOR Take 50 mg by mouth 2 (two) times daily.   montelukast 10 MG tablet Commonly known as:  SINGULAIR Take 10 mg by mouth at bedtime.   nitrofurantoin (macrocrystal-monohydrate) 100 MG capsule Commonly known as:  MACROBID Take 100 mg by mouth daily.   nitroGLYCERIN 0.4 MG SL tablet Commonly known as:  NITROSTAT Place 0.4 mg under the tongue every 5 (five) minutes as needed for chest pain.   ondansetron 4 MG tablet Commonly known as:  ZOFRAN Take 4 mg by mouth every 6 (six) hours as needed for nausea or vomiting.   OXYGEN Inhale 2 L into the lungs as needed (To maintain saturations >90%).   PATADAY 0.2 % Soln Generic drug:  Olopatadine HCl Place 1  drop into both eyes daily.   potassium chloride SA 20 MEQ tablet Commonly known as:  K-DUR,KLOR-CON Take 1 tablet (20 mEq total) by mouth daily.   predniSONE 10 MG tablet Commonly known as:  DELTASONE Take 10 mg by mouth daily. For RA   rosuvastatin 20 MG tablet Commonly known as:  CRESTOR Take 20 mg by mouth every evening. For hyperlipidemia   saccharomyces boulardii 250 MG capsule Commonly known as:  FLORASTOR Take 1 capsule (250 mg total) by mouth 2 (two) times daily.   sertraline 50 MG tablet Commonly known as:  ZOLOFT Take 50 mg by mouth every morning.  For depression   Vitamin D 2000 units Caps Take 1 capsule (2,000 Units total) by mouth daily.      Contact information for after-discharge care    Destination    [facility] .   Specialty:  [place] information: [address] [place] [phone]             Allergies  Allergen Reactions  . Codeine Swelling  . Sulfa Antibiotics Other (See Comments)    GI upset  . Biphosphate Other (See Comments)  . Morphine And Related Other (See Comments)  . Percocet [Oxycodone-Acetaminophen] Other (See Comments)  . Plavix [Clopidogrel Bisulfate] Itching and Rash   Consultations:  PCCM   Procedures/Studies: Dg Chest 2 View  Result Date: 11/13/2016 CLINICAL DATA:  83 year old female with fever, nonproductive cough, wheezing and shortness of breath. EXAM: CHEST  2 VIEW COMPARISON:  09/15/2016 and earlier. FINDINGS: AP and lateral views of the chest. Chronic hiatal hernia. Stable cardiac size and mediastinal contours. Calcified aortic atherosclerosis. Stable to mildly improved lung volumes. No pneumothorax. No pulmonary edema or pleural effusion. No consolidation. No convincing acute pulmonary opacity. Extensive chronic thoracolumbar spinal hardware appears stable. Negative visible bowel gas pattern. Stable visualized osseous structures. IMPRESSION: No acute  cardiopulmonary abnormality. Electronically Signed   By: Genevie Ann M.D.   On: 11/13/2016 16:53    ECHOCARDIOGRAM ------------------------------------------------------------------- Study Conclusions  - Left ventricle: The cavity size was normal. There was mild focal   basal hypertrophy of the septum. Systolic function was vigorous.   The estimated ejection fraction was in the range of 65% to 70%.   Wall motion was normal; there were no regional wall motion   abnormalities. Doppler parameters are consistent with abnormal   left ventricular relaxation (grade 1 diastolic dysfunction).   Doppler parameters are consistent with high ventricular filling   pressure. - Mitral valve: Calcified annulus. The findings are consistent with   mild stenosis. There was mild regurgitation. Valve area by   pressure half-time: 2.06 cm^2. Valve area by continuity equation   (using LVOT flow): 1.44 cm^2. - Left atrium: The atrium was severely dilated. - Tricuspid valve: There was mild-moderate regurgitation. - Pulmonary arteries: Systolic pressure was moderately increased.   PA peak pressure: 62 mm Hg (S). - Pericardium, extracardiac: A small pericardial effusion was   identified.  Impressions:  - Vigorous LV systolic function; mild diastolic dysfunction with   elevated LV filling pressure; MAC with mild MS (mean gradient 6   mmHg) and mild MR; severe LAE; mild to moderate TR with   moderately elevated pulmonary pressure; small pericardial   effusion.  Subjective: Seen and examined and was feeling better. Had no complaints or concerns. No nausea or vomiting. Patient denied and CP or SOB.   Discharge Exam: Vitals:   11/18/16 2002 11/19/16 0544  BP: (!) 161/76 (!) 165/68  Pulse: 78 65  Resp: 18 18  Temp: 98 F (36.7 C) 98.1 F (36.7 C)   Vitals:   11/18/16 1125 11/18/16 1343 11/18/16 2002 11/19/16 0544  BP: (!) 160/76 130/61 (!) 161/76 (!) 165/68  Pulse: 81 76 78 65  Resp:  _0 Temp:   98.6 F (37 C) 98 F (36.7 C) 98.1 F (36.7 C)  TempSrc:   Oral Oral  SpO2:   99% 100%  Weight:    59.1 kg (130 lb 6.4 oz)  Height:       General: Pt is alert, awake, not  in acute distress Cardiovascular: RRR, S1/S2 +, no rubs, no gallops Respiratory: CTA bilaterally, no wheezing, no rhonchi Abdominal: Soft, NT, ND, bowel sounds + Extremities: no edema, no cyanosis  The results of significant diagnostics from this hospitalization (including imaging, microbiology, ancillary and laboratory) are listed below for reference.    Microbiology: Recent Results (from the past 240 hour(s))  Culture, blood (x 2)     Status: None   Collection Time: 11/13/16 11:12 PM  Result Value Ref Range Status   Specimen Description BLOOD LEFT ANTECUBITAL  Final   Special Requests   Final    BOTTLES DRAWN AEROBIC AND ANAEROBIC Blood Culture adequate volume   Culture   Final    NO GROWTH 5 DAYS Performed at [facility], [address] [place], [place] [address]    Report Status 11/19/2016 FINAL  Final  Culture, blood (x 2)     Status: None   Collection Time: 11/13/16 11:17 PM  Result Value Ref Range Status   Specimen Description BLOOD LEFT HAND  Final   Special Requests IN PEDIATRIC BOTTLE Blood Culture adequate volume  Final   Culture   Final    NO GROWTH 5 DAYS Performed at [facility], [place] [address], [place], [place] [address]    Report Status 11/19/2016 FINAL  Final  MRSA PCR Screening     Status: None   Collection Time: 11/13/16 11:41 PM  Result Value Ref Range Status   MRSA by PCR NEGATIVE NEGATIVE Final    Comment:        The GeneXpert MRSA Assay (FDA approved for NASAL specimens only), is one component of a comprehensive MRSA colonization surveillance program. It is not intended to diagnose MRSA infection nor to guide or monitor treatment for MRSA infections.      Labs: BNP (last 3 results)  Recent Labs  11/15/16 0741  BNP 7,124.5*   Basic Metabolic  Panel:  Recent Labs Lab 11/15/16 0741 11/16/16 0753 11/17/16 0327 11/18/16 0530 11/19/16 0607 11/19/16 0846  NA 141 140 142 140 140  --   K 3.4* 3.3* 3.6 4.0 4.4  --   CL 114* 115* 109 106 102  --   CO2 18* 21* _0 --   GLUCOSE 132* 91 107* 89 100*  --   BUN 26* 26* 31* 29* 34*  --   CREATININE 1.39* 1.16* 1.50* 1.54* 1.63*  --   CALCIUM 7.5* 7.2* 7.5* 7.6* 8.3*  --   MG  --  1.3*  --  1.9  --  1.8  PHOS  --   --   --   --   --  3.2   Liver Function Tests:  Recent Labs Lab 11/14/16 0203 11/19/16 0846  AST 94* 35  ALT 66* 35  ALKPHOS 48 53  BILITOT 0.4 0.6  PROT 5.6* 5.8*  ALBUMIN 2.8* 2.7*   No results for input(s): LIPASE, AMYLASE in the last 168 hours. No results for input(s): AMMONIA in the last 168 hours. CBC:  Recent Labs Lab 11/13/16 1655 11/14/16 0516 11/15/16 0741 11/16/16 0753 11/17/16 0327 11/18/16 0530 11/19/16 0846  WBC 12.8* 11.6* 22.8* 14.7* 10.5 10.4 11.6*  NEUTROABS 10.8* 10.5*  --   --   --   --  8.2*  HGB 11.7* 10.0* 10.2* 9.5* 9.4* 9.7* 10.1*  HCT 37.5 31.2* 31.8* 29.9* 30.6* 31.2* 32.5*  MCV 91.5 91.8 91.1 91.4 91.3 90.2 89.8  PLT 173 147* 163 136* 134*  143* 184   Cardiac Enzymes:  Recent Labs Lab 11/14/16 0203 11/14/16 0818 11/14/16 1401 11/15/16 0741  TROPONINI 0.16* 0.62* 0.64* 0.43*   BNP: Invalid input(s): POCBNP CBG: No results for input(s): GLUCAP in the last 168 hours. D-Dimer No results for input(s): DDIMER in the last 72 hours. Hgb A1c No results for input(s): HGBA1C in the last 72 hours. Lipid Profile No results for input(s): CHOL, HDL, LDLCALC, TRIG, CHOLHDL, LDLDIRECT in the last 72 hours. Thyroid function studies No results for input(s): TSH, T4TOTAL, T3FREE, THYROIDAB in the last 72 hours.  Invalid input(s): FREET3 Anemia work up No results for input(s): VITAMINB12, FOLATE, FERRITIN, TIBC, IRON, RETICCTPCT in the last 72 hours. Urinalysis    Component Value Date/Time   COLORURINE YELLOW  11/13/2016 2010   APPEARANCEUR CLEAR 11/13/2016 2010   APPEARANCEUR Hazy 03/06/2013 1013   LABSPEC 1.018 11/13/2016 2010   LABSPEC 1.013 03/06/2013 1013   PHURINE 5.0 11/13/2016 2010   GLUCOSEU 150 (A) 11/13/2016 2010   GLUCOSEU Negative 03/06/2013 1013   HGBUR NEGATIVE 11/13/2016 2010   BILIRUBINUR NEGATIVE 11/13/2016 2010   BILIRUBINUR Negative 03/06/2013 [place] 11/13/2016 2010   PROTEINUR 30 (A) 11/13/2016 2010   UROBILINOGEN 0.2 12/18/2012 1203   NITRITE NEGATIVE 11/13/2016 2010   LEUKOCYTESUR NEGATIVE 11/13/2016 2010   LEUKOCYTESUR 3+ 03/06/2013 1013   Sepsis Labs Invalid input(s): PROCALCITONIN,  WBC,  LACTICIDVEN Microbiology Recent Results (from the past 240 hour(s))  Culture, blood (x 2)     Status: None   Collection Time: 11/13/16 11:12 PM  Result Value Ref Range Status   Specimen Description BLOOD LEFT ANTECUBITAL  Final   Special Requests   Final    BOTTLES DRAWN AEROBIC AND ANAEROBIC Blood Culture adequate volume   Culture   Final    NO GROWTH 5 DAYS Performed at [facility], [place] [address] [place], [place] [address]    Report Status 11/19/2016 FINAL  Final  Culture, blood (x 2)     Status: None   Collection Time: 11/13/16 11:17 PM  Result Value Ref Range Status   Specimen Description BLOOD LEFT HAND  Final   Special Requests IN PEDIATRIC BOTTLE Blood Culture adequate volume  Final   Culture   Final    NO GROWTH 5 DAYS Performed at [facility], [place] [address], [place], [place] [address]    Report Status 11/19/2016 FINAL  Final  MRSA PCR Screening     Status: None   Collection Time: 11/13/16 11:41 PM  Result Value Ref Range Status   MRSA by PCR NEGATIVE NEGATIVE Final    Comment:        The GeneXpert MRSA Assay (FDA approved for NASAL specimens only), is one component of a comprehensive MRSA colonization surveillance program. It is not intended to diagnose MRSA infection nor to guide or monitor treatment for MRSA  infections.    Time coordinating discharge: 35 minutes  SIGNED:  Kerney Elbe, DO [facility] 11/19/2016, 12:46 PM Pager [phone]  If 7PM-7AM, please contact night-coverage [email or web address] Password [email or web address]

## 2016-11-21 ENCOUNTER — Telehealth: Payer: Self-pay

## 2016-11-21 NOTE — Telephone Encounter (Signed)
This is a patient of [place], who was admitted to [facility] after hospitalization. [facility] F/U is needed. Hospital discharge from [facility] on 11/19/2016.

## 2016-11-22 ENCOUNTER — Non-Acute Institutional Stay (SKILLED_NURSING_FACILITY): Payer: Medicare Other | Admitting: Internal Medicine

## 2016-11-22 DIAGNOSIS — I421 Obstructive hypertrophic cardiomyopathy: Secondary | ICD-10-CM | POA: Diagnosis not present

## 2016-11-22 DIAGNOSIS — J129 Viral pneumonia, unspecified: Secondary | ICD-10-CM | POA: Diagnosis not present

## 2016-11-22 DIAGNOSIS — M0689 Other specified rheumatoid arthritis, multiple sites: Secondary | ICD-10-CM | POA: Diagnosis not present

## 2016-11-22 DIAGNOSIS — J4520 Mild intermittent asthma, uncomplicated: Secondary | ICD-10-CM | POA: Diagnosis not present

## 2016-11-22 NOTE — Progress Notes (Signed)
Facility; [facility]  11/22/16  Chief complaint; readmission to the facility post stay at [facility] 11/13/16 through 11/19/16. She was sent there because of chest congestion cough. The patient tells me she was coughing so much she couldn't sleep and was unable to eat. She had a low-grade fever. She was admitted with sepsis and possible pneumonia but her chest x-ray was clear and therefore she was felt to have a viral illness. She received IV fluid and went into heart failure and then she was diuresis. She was sent back to the nursing home for ongoing care. The patient tells me she has been here for 5 years The patient and is on chronic prednisone at 10 mg. That is continued. She comes back on when necessary duo nebs, Singulair. I therefore wondering if she also has asthma  The patient states she is feels back to normal now. She is on oxygen and tells me this is new. As noted she tells me that she's been in the building for many years stating this is because of "3 strokes"  CBC Latest Ref Rng & Units 11/19/2016 11/18/2016 11/17/2016  WBC 4.0 - 10.5 K/uL 11.6(H) 10.4 10.5  Hemoglobin 12.0 - 15.0 g/dL 10.1(L) 9.7(L) 9.4(L)  Hematocrit 36.0 - 46.0 % 32.5(L) 31.2(L) 30.6(L)  Platelets 150 - 400 K/uL 184 143(L) 134(L)    BMP Latest Ref Rng & Units 11/19/2016 11/18/2016 11/17/2016  Glucose 65 - 99 mg/dL 100(H) 89 107(H)  BUN 6 - 20 mg/dL 34(H) 29(H) 31(H)  Creatinine 0.44 - 1.00 mg/dL 1.63(H) 1.54(H) 1.50(H)  Sodium 135 - 145 mmol/L 140 140 142  Potassium 3.5 - 5.1 mmol/L 4.4 4.0 3.6  Chloride 101 - 111 mmol/L 102 106 109  CO2 22 - 32 mmol/L 31 26 25   Calcium 8.9 - 10.3 mg/dL 8.3(L) 7.6(L) 7.5(L)    Past Medical History:  Diagnosis Date  . Anxiety   . Arthritis   . Chronic diastolic CHF (congestive heart failure) ([place])    a. 10/2014 Echo: EF 60-65%, mild LVH, grade 1 DD, dynamic obstruction @ rest - peak velocity of 271 cm/sec, peak grad of 70mmHg, PASP 2mmHg.  Marland Kitchen Coronary artery disease    a. 2012 s/p NSTEMI/Cath: 95% apical LAD dzs, otw nonobs dzs-->Med Rx;  b. 05/2011 MV: no ischemia, EF 79%, inf infarct- attenuation.  . CVA (cerebral vascular accident) ([place])   . Depression   . DVT of lower extremity (deep venous thrombosis) ([place])   . Dyspnea   . Elevated troponin    a. 10/2014 in setting of sepsis/?HCAP.  Marland Kitchen Enteritis due to Clostridium difficile 01/04/2013  . Essential hypertension   . Fall   . Fatigue   . GERD (gastroesophageal reflux disease)   . GI bleed 2005  . HCAP (healthcare-associated pneumonia) 01/22/2013  . Hemorrhoids   . Hiatal hernia   . Hyperlipidemia   . Hypothyroidism   . Malnutrition ([place])   . Obstructive hypertrophic cardiomyopathy ([place])    a. 10/2014 Echo: EF 60-65%, mild LVH, grade 1 DD, dynamic obstruction @ rest - peak velocity of 271 cm/sec, peak grad of 16mmHg, PASP 25mmHg.  . Osteoporosis   . PUD (peptic ulcer disease)   . RA (rheumatoid arthritis) ([place])   . Recurrent colitis due to Clostridium difficile 06/02/2012  . Renal insufficiency   . Right ear pain   . Sepsis ([place]) 12/19/2012   UTIs  . TIA (transient ischemic attack)   . Weight loss    Past Surgical History:  Procedure Laterality Date  . ANKLE SURGERY    . BACK SURGERY    . [place]   x3  . CARDIAC CATHETERIZATION     SHOWED RUPTURE PLAQUE IN THE LAD. THE LAD IS NONOBSTRUCTIVE WITH ONLY 30-40% STENOSIS  . COLONOSCOPY    . KNEE SURGERY    . NSTEMI  06/2010  . SPINAL FUSION SURGERY      Current Outpatient Prescriptions on File Prior to Visit  Medication Sig Dispense Refill  . apixaban (ELIQUIS) 2.5 MG TABS tablet Take 1 tablet (2.5 mg total) by mouth 2 (two) times daily. 60 tablet 0  . Artificial Saliva (ACT DRY MOUTH) LOZG Use as directed in the mouth or throat as needed (for dry mouth).    . calcium-vitamin D (OSCAL WITH D) 500-200 MG-UNIT per tablet Take 1 tablet by mouth 3 (three) times daily.    . carboxymethylcellulose 1 % ophthalmic solution Apply 1 drop  to eye 3 (three) times daily. Both eyes    . cetirizine (ZYRTEC) 10 MG tablet Take 10 mg by mouth daily.     . Cholecalciferol (VITAMIN D) 2000 UNITS CAPS Take 1 capsule (2,000 Units total) by mouth daily. 30 capsule 11  . Cranberry 200 MG CAPS Take 2 capsules by mouth 2 (two) times daily.     Marland Kitchen denosumab (PROLIA) 60 MG/ML SOLN injection Inject 60 mg into the skin every 6 (six) months. Administer in upper arm, thigh, or abdomen    . esomeprazole (NEXIUM) 40 MG capsule Take 40 mg by mouth 2 (two) times daily before a meal.     . ferrous sulfate 325 (65 FE) MG tablet Take 325 mg by mouth daily.     . furosemide (LASIX) 40 MG tablet Take 1 tablet (40 mg total) by mouth daily. 30 tablet 0  . guaiFENesin (ROBITUSSIN) 100 MG/5ML liquid Take 200 mg by mouth 4 (four) times daily as needed for cough.    Marland Kitchen HYDROcodone-acetaminophen (NORCO/VICODIN) 5-325 MG tablet Take 1 tablet by mouth daily. 10 tablet 0  . ipratropium (ATROVENT) 0.03 % nasal spray Place 2 sprays into both nostrils daily as needed (For asthma).     . ipratropium-albuterol (DUONEB) 0.5-2.5 (3) MG/3ML SOLN Take 3 mLs by nebulization every 8 (eight) hours as needed (wheezing).     Marland Kitchen levothyroxine (SYNTHROID, LEVOTHROID) 25 MCG tablet Take 1 tablet (25 mcg total) by mouth daily before breakfast.    . lidocaine (LIDODERM) 5 % Place 1 patch onto the skin daily. Remove & Discard patch within 12 hours or as directed by MD 30 patch 0  . LORazepam (ATIVAN) 0.5 MG tablet Take one tablet by mouth every night at bedtime for anxiety 10 tablet 0  . meclizine (ANTIVERT) 12.5 MG tablet Take 12.5 mg by mouth 2 (two) times daily.     . Menthol, Topical Analgesic, (BIOFREEZE) 4 % GEL Apply 1 application topically 2 (two) times daily. For wrist pain     . metoprolol (LOPRESSOR) 50 MG tablet Take 50 mg by mouth 2 (two) times daily.     . montelukast (SINGULAIR) 10 MG tablet Take 10 mg by mouth at bedtime.     . Multiple Vitamins-Minerals (CERTAVITE/ANTIOXIDANTS  PO) Take 1 tablet by mouth daily.     . nitrofurantoin, macrocrystal-monohydrate, (MACROBID) 100 MG capsule Take 100 mg by mouth daily.     . nitroGLYCERIN (NITROSTAT) 0.4 MG SL tablet Place 0.4 mg under the tongue every 5 (five) minutes as needed for chest  pain.    . ondansetron (ZOFRAN) 4 MG tablet Take 4 mg by mouth every 6 (six) hours as needed for nausea or vomiting.    . OXYGEN Inhale 2 L into the lungs as needed (To maintain saturations >90%).     Marland Kitchen PATADAY 0.2 % SOLN Place 1 drop into both eyes daily.     . potassium chloride SA (K-DUR,KLOR-CON) 20 MEQ tablet Take 1 tablet (20 mEq total) by mouth daily. 30 tablet 0  . predniSONE (DELTASONE) 10 MG tablet Take 10 mg by mouth daily. For RA    . rosuvastatin (CRESTOR) 20 MG tablet Take 20 mg by mouth every evening. For hyperlipidemia    . saccharomyces boulardii (FLORASTOR) 250 MG capsule Take 1 capsule (250 mg total) by mouth 2 (two) times daily. 20 capsule 0  . sertraline (ZOLOFT) 50 MG tablet Take 50 mg by mouth every morning. For depression      Review of systems Gen. patient states she feels back to normal Respiratory no clear shortness of breath but she has oxygen on Cardiac no complaints of chest pain GI no diarrhea GU no dysuria.  Physical examination Gen. the patient is not in any distress. Vitals temperature 96.8 blood pressure 183/93 pulse 87 respirations 16 O2 sat is 96% presumably on 2 L HEENT oral exam is normal mucous membranes moist Respiratory shallow but otherwise clear air entry bilaterally Cardiac; in spite of the obstructive cardiomyopathy history I can elicit no murmur. She appears to be euvolemic heart sounds are normal Abdomen soft nontender no masses. Musculoskeletal she is able to move all 4 extremities. There is generalized weakness. Chronic arthritis in her hands but she is able to move her hands and wrists. Neuro neurologic no lateralizing signs Skin no rash.  Impression/plan #1 viral illness?  Pneumonia #2 iatrogenic congestive heart failure; I will check her BMP this week #3 in spite of what she is telling me it seems that she was on oxygen PTA I have reviewed notes #4 one would wonder about asthma although I don't see this on her problem list. Breathing today is clear #5 chronic renal failure it would appear that this is stable.  From physical exam and review of the notes everything appears to be stable here.

## 2016-12-08 DIAGNOSIS — E039 Hypothyroidism, unspecified: Secondary | ICD-10-CM | POA: Diagnosis not present

## 2016-12-08 DIAGNOSIS — N390 Urinary tract infection, site not specified: Secondary | ICD-10-CM | POA: Diagnosis not present

## 2016-12-08 DIAGNOSIS — F329 Major depressive disorder, single episode, unspecified: Secondary | ICD-10-CM | POA: Diagnosis not present

## 2017-01-02 DIAGNOSIS — N390 Urinary tract infection, site not specified: Secondary | ICD-10-CM | POA: Diagnosis not present

## 2017-01-14 DIAGNOSIS — F411 Generalized anxiety disorder: Secondary | ICD-10-CM | POA: Diagnosis not present

## 2017-01-14 DIAGNOSIS — F329 Major depressive disorder, single episode, unspecified: Secondary | ICD-10-CM | POA: Diagnosis not present

## 2017-01-15 DIAGNOSIS — E039 Hypothyroidism, unspecified: Secondary | ICD-10-CM | POA: Diagnosis not present

## 2017-01-15 DIAGNOSIS — M069 Rheumatoid arthritis, unspecified: Secondary | ICD-10-CM | POA: Diagnosis not present

## 2017-01-15 DIAGNOSIS — K449 Diaphragmatic hernia without obstruction or gangrene: Secondary | ICD-10-CM | POA: Diagnosis not present

## 2017-01-15 DIAGNOSIS — F411 Generalized anxiety disorder: Secondary | ICD-10-CM | POA: Diagnosis not present

## 2017-01-15 DIAGNOSIS — I679 Cerebrovascular disease, unspecified: Secondary | ICD-10-CM | POA: Diagnosis not present

## 2017-01-15 DIAGNOSIS — F339 Major depressive disorder, recurrent, unspecified: Secondary | ICD-10-CM | POA: Diagnosis not present

## 2017-02-11 DIAGNOSIS — M6281 Muscle weakness (generalized): Secondary | ICD-10-CM | POA: Diagnosis not present

## 2017-02-12 DIAGNOSIS — M6281 Muscle weakness (generalized): Secondary | ICD-10-CM | POA: Diagnosis not present

## 2017-02-13 DIAGNOSIS — M6281 Muscle weakness (generalized): Secondary | ICD-10-CM | POA: Diagnosis not present

## 2017-02-16 DIAGNOSIS — I70235 Atherosclerosis of native arteries of right leg with ulceration of other part of foot: Secondary | ICD-10-CM | POA: Diagnosis not present

## 2017-02-16 DIAGNOSIS — M6281 Muscle weakness (generalized): Secondary | ICD-10-CM | POA: Diagnosis not present

## 2017-02-17 DIAGNOSIS — M6281 Muscle weakness (generalized): Secondary | ICD-10-CM | POA: Diagnosis not present

## 2017-02-18 DIAGNOSIS — M6281 Muscle weakness (generalized): Secondary | ICD-10-CM | POA: Diagnosis not present

## 2017-02-18 DIAGNOSIS — R0603 Acute respiratory distress: Secondary | ICD-10-CM | POA: Diagnosis not present

## 2017-02-19 DIAGNOSIS — M6281 Muscle weakness (generalized): Secondary | ICD-10-CM | POA: Diagnosis not present

## 2017-02-20 DIAGNOSIS — M6281 Muscle weakness (generalized): Secondary | ICD-10-CM | POA: Diagnosis not present

## 2017-02-23 DIAGNOSIS — M6281 Muscle weakness (generalized): Secondary | ICD-10-CM | POA: Diagnosis not present

## 2017-02-24 DIAGNOSIS — D508 Other iron deficiency anemias: Secondary | ICD-10-CM | POA: Diagnosis not present

## 2017-02-24 DIAGNOSIS — M6281 Muscle weakness (generalized): Secondary | ICD-10-CM | POA: Diagnosis not present

## 2017-02-24 DIAGNOSIS — N390 Urinary tract infection, site not specified: Secondary | ICD-10-CM | POA: Diagnosis not present

## 2017-02-24 DIAGNOSIS — M10031 Idiopathic gout, right wrist: Secondary | ICD-10-CM | POA: Diagnosis not present

## 2017-02-24 DIAGNOSIS — E039 Hypothyroidism, unspecified: Secondary | ICD-10-CM | POA: Diagnosis not present

## 2017-02-24 DIAGNOSIS — N219 Calculus of lower urinary tract, unspecified: Secondary | ICD-10-CM | POA: Diagnosis not present

## 2017-02-24 DIAGNOSIS — E08311 Diabetes mellitus due to underlying condition with unspecified diabetic retinopathy with macular edema: Secondary | ICD-10-CM | POA: Diagnosis not present

## 2017-02-24 DIAGNOSIS — D649 Anemia, unspecified: Secondary | ICD-10-CM | POA: Diagnosis not present

## 2017-02-24 DIAGNOSIS — E038 Other specified hypothyroidism: Secondary | ICD-10-CM | POA: Diagnosis not present

## 2017-02-25 DIAGNOSIS — M6281 Muscle weakness (generalized): Secondary | ICD-10-CM | POA: Diagnosis not present

## 2017-02-26 DIAGNOSIS — M6281 Muscle weakness (generalized): Secondary | ICD-10-CM | POA: Diagnosis not present

## 2017-02-27 DIAGNOSIS — M6281 Muscle weakness (generalized): Secondary | ICD-10-CM | POA: Diagnosis not present

## 2017-03-02 DIAGNOSIS — M6281 Muscle weakness (generalized): Secondary | ICD-10-CM | POA: Diagnosis not present

## 2017-03-03 ENCOUNTER — Emergency Department (HOSPITAL_COMMUNITY): Payer: Medicare Other

## 2017-03-03 ENCOUNTER — Emergency Department (HOSPITAL_COMMUNITY)
Admission: EM | Admit: 2017-03-03 | Discharge: 2017-03-03 | Disposition: A | Discharge status: Home / Self Care | Payer: Medicare Other | Attending: Emergency Medicine | Admitting: Emergency Medicine

## 2017-03-03 ENCOUNTER — Encounter (HOSPITAL_COMMUNITY): Payer: Self-pay | Admitting: Nurse Practitioner

## 2017-03-03 DIAGNOSIS — K5730 Diverticulosis of large intestine without perforation or abscess without bleeding: Secondary | ICD-10-CM | POA: Diagnosis not present

## 2017-03-03 DIAGNOSIS — I5032 Chronic diastolic (congestive) heart failure: Secondary | ICD-10-CM | POA: Insufficient documentation

## 2017-03-03 DIAGNOSIS — R109 Unspecified abdominal pain: Secondary | ICD-10-CM | POA: Insufficient documentation

## 2017-03-03 DIAGNOSIS — N183 Chronic kidney disease, stage 3 (moderate): Secondary | ICD-10-CM | POA: Insufficient documentation

## 2017-03-03 DIAGNOSIS — R509 Fever, unspecified: Secondary | ICD-10-CM | POA: Diagnosis not present

## 2017-03-03 DIAGNOSIS — R112 Nausea with vomiting, unspecified: Secondary | ICD-10-CM

## 2017-03-03 DIAGNOSIS — E039 Hypothyroidism, unspecified: Secondary | ICD-10-CM | POA: Insufficient documentation

## 2017-03-03 DIAGNOSIS — R531 Weakness: Secondary | ICD-10-CM | POA: Diagnosis not present

## 2017-03-03 DIAGNOSIS — R110 Nausea: Secondary | ICD-10-CM | POA: Diagnosis not present

## 2017-03-03 DIAGNOSIS — I130 Hypertensive heart and chronic kidney disease with heart failure and stage 1 through stage 4 chronic kidney disease, or unspecified chronic kidney disease: Secondary | ICD-10-CM | POA: Insufficient documentation

## 2017-03-03 DIAGNOSIS — K449 Diaphragmatic hernia without obstruction or gangrene: Secondary | ICD-10-CM | POA: Diagnosis not present

## 2017-03-03 DIAGNOSIS — R4182 Altered mental status, unspecified: Secondary | ICD-10-CM | POA: Diagnosis not present

## 2017-03-03 DIAGNOSIS — Z79899 Other long term (current) drug therapy: Secondary | ICD-10-CM | POA: Diagnosis not present

## 2017-03-03 LAB — COMPREHENSIVE METABOLIC PANEL
ALT: 13 U/L — ABNORMAL LOW (ref 14–54)
AST: 18 U/L (ref 15–41)
Albumin: 2.6 g/dL — ABNORMAL LOW (ref 3.5–5.0)
Alkaline Phosphatase: 51 U/L (ref 38–126)
Anion gap: 7 (ref 5–15)
BUN: 31 mg/dL — ABNORMAL HIGH (ref 6–20)
CO2: 23 mmol/L (ref 22–32)
Calcium: 8 mg/dL — ABNORMAL LOW (ref 8.9–10.3)
Chloride: 111 mmol/L (ref 101–111)
Creatinine, Ser: 1.56 mg/dL — ABNORMAL HIGH (ref 0.44–1.00)
GFR calc Af Amer: 34 mL/min — ABNORMAL LOW (ref >60)
GFR calc non Af Amer: 29 mL/min — ABNORMAL LOW (ref >60)
Glucose, Bld: 144 mg/dL — ABNORMAL HIGH (ref 65–99)
Potassium: 4.1 mmol/L (ref 3.5–5.1)
Sodium: 141 mmol/L (ref 135–145)
Total Bilirubin: 0.5 mg/dL (ref 0.3–1.2)
Total Protein: 5.3 g/dL — ABNORMAL LOW (ref 6.5–8.1)

## 2017-03-03 LAB — CBC WITH DIFFERENTIAL/PLATELET
BASOS ABS: 0 10*3/uL (ref 0.0–0.1)
Basophils Relative: 0 %
Eosinophils Absolute: 0 10*3/uL (ref 0.0–0.7)
Eosinophils Relative: 0 %
HEMATOCRIT: 30.1 % — AB (ref 36.0–46.0)
Hemoglobin: 9.6 g/dL — ABNORMAL LOW (ref 12.0–15.0)
LYMPHS ABS: 0.6 10*3/uL — AB (ref 0.7–4.0)
Lymphocytes Relative: 4 %
MCH: 28.1 pg (ref 26.0–34.0)
MCHC: 31.9 g/dL (ref 30.0–36.0)
MCV: 88 fL (ref 78.0–100.0)
MONO ABS: 0.6 10*3/uL (ref 0.1–1.0)
MONOS PCT: 4 %
NEUTROS ABS: 13.8 10*3/uL — AB (ref 1.7–7.7)
Neutrophils Relative %: 92 %
Platelets: 171 10*3/uL (ref 150–400)
RBC: 3.42 MIL/uL — ABNORMAL LOW (ref 3.87–5.11)
RDW: 16.2 % — AB (ref 11.5–15.5)
WBC: 15 10*3/uL — ABNORMAL HIGH (ref 4.0–10.5)

## 2017-03-03 LAB — I-STAT CG4 LACTIC ACID, ED
Lactic Acid, Venous: 1.71 mmol/L (ref 0.5–1.9)
Lactic Acid, Venous: 1.31 mmol/L (ref 0.5–1.9)

## 2017-03-03 LAB — URINALYSIS, ROUTINE W REFLEX MICROSCOPIC
Bilirubin Urine: NEGATIVE
GLUCOSE, UA: NEGATIVE mg/dL
Hgb urine dipstick: NEGATIVE
KETONES UR: NEGATIVE mg/dL
LEUKOCYTES UA: NEGATIVE
Nitrite: NEGATIVE
PH: 5 (ref 5.0–8.0)
Protein, ur: NEGATIVE mg/dL
SPECIFIC GRAVITY, URINE: 1.006 (ref 1.005–1.030)

## 2017-03-03 MED ORDER — IOPAMIDOL (ISOVUE-300) INJECTION 61%
INTRAVENOUS | Status: AC
Start: 2017-03-03 — End: 2017-03-03
  Administered 2017-03-03: 30 mL
  Filled 2017-03-03: qty 30

## 2017-03-03 MED ORDER — IOPAMIDOL (ISOVUE-300) INJECTION 61%: 100.0000 mL | Freq: Once | INTRAVENOUS | Status: DC | PRN

## 2017-03-03 MED ORDER — ONDANSETRON 4 MG PO TBDP: 4.0000 mg | ORAL_TABLET | Freq: Three times a day (TID) | ORAL | 0 refills | Status: AC | PRN

## 2017-03-03 MED ORDER — IOPAMIDOL (ISOVUE-300) INJECTION 61%: 30.0000 mL | Freq: Once | INTRAVENOUS | Status: DC | PRN

## 2017-03-03 MED ORDER — SODIUM CHLORIDE 0.9 % IV BOLUS (SEPSIS)
1000.0000 mL | Freq: Once | INTRAVENOUS | Status: AC
  Administered 2017-03-03: 1000 mL via INTRAVENOUS

## 2017-03-03 NOTE — ED Provider Notes (Signed)
[facility] DEPT [facility] Provider Note   CSN: [id number] Arrival date & time: 03/03/17  1349     History   Chief Complaint Chief Complaint  Patient presents with  . Fever  . Emesis  . Weakness    HPI Anaissa Macfadden is a 83 y.o. female.  The history is provided by the patient and a relative. No language interpreter was used.  Fever   Associated symptoms include vomiting.  Emesis   Associated symptoms include a fever.  Weakness  Associated symptoms include vomiting.    Cami Delawder is a 83 y.o. female who presents to the Emergency Department complaining of fever, vomiting.  Level V caveat due to confusion.  He presents from nursing facility for evaluation of fever and confusion. Her daughter provides the history. His daughter reports that she is experiencing intermittent fevers over the last week and a half to 101. She has expressed intermittent nausea, vomiting, diarrhea. Last episode of emesis was this morning. She has a history of frequent UTIs and is on Macrobid daily. Patient does have a history of chronic immunosuppression on daily steroids as well as sepsis.  Past Medical History:  Diagnosis Date  . Anxiety   . Arthritis   . Chronic diastolic CHF (congestive heart failure) ([place])    a. 10/2014 Echo: EF 60-65%, mild LVH, grade 1 DD, dynamic obstruction @ rest - peak velocity of 271 cm/sec, peak grad of 68mmHg, PASP 68mmHg.  Marland Kitchen Coronary artery disease    a. 2012 s/p NSTEMI/Cath: 95% apical LAD dzs, otw nonobs dzs-->Med Rx;  b. 05/2011 MV: no ischemia, EF 79%, inf infarct- attenuation.  . CVA (cerebral vascular accident) ([place])   . Depression   . DVT of lower extremity (deep venous thrombosis) ([place])   . Dyspnea   . Elevated troponin    a. 10/2014 in setting of sepsis/?HCAP.  Marland Kitchen Enteritis due to Clostridium difficile 01/04/2013  . Essential hypertension   . Fall   . Fatigue   . GERD (gastroesophageal reflux disease)   . GI bleed 2005  . HCAP (healthcare-associated pneumonia)  01/22/2013  . Hemorrhoids   . Hiatal hernia   . Hyperlipidemia   . Hypothyroidism   . Malnutrition ([place])   . Obstructive hypertrophic cardiomyopathy ([place])    a. 10/2014 Echo: EF 60-65%, mild LVH, grade 1 DD, dynamic obstruction @ rest - peak velocity of 271 cm/sec, peak grad of 71mmHg, PASP 49mmHg.  . Osteoporosis   . PUD (peptic ulcer disease)   . RA (rheumatoid arthritis) ([place])   . Recurrent colitis due to Clostridium difficile 06/02/2012  . Renal insufficiency   . Right ear pain   . Sepsis ([employer]) 12/19/2012   UTIs  . TIA (transient ischemic attack)   . Weight loss     Patient Active Problem List   Diagnosis Date Noted  . Hyperglycemia 11/14/2016  . History of DVT (deep vein thrombosis) 11/13/2016  . Sepsis ([place]) 09/15/2016  . Wrist swelling, left 09/15/2016  . Acute encephalopathy 09/15/2016  . Pain and swelling of left wrist   . UTI (urinary tract infection) 04/08/2016  . Benign paroxysmal positional vertigo 12/19/2015  . Easy bruising 09/20/2015  . CKD (chronic kidney disease) stage 3, GFR 30-59 ml/min 07/27/2015  . Anemia of chronic disease 07/27/2015  . Pulmonary hypertension ([place])   . Generalized osteoarthritis 04/11/2015  . Thyroid activity decreased 04/11/2015  . Coronary artery disease   . Obstructive hypertrophic cardiomyopathy ([place])   . Chronic diastolic CHF (congestive heart failure) ([place])   .  HCAP (healthcare-associated pneumonia) 11/24/2014  . Vertigo 05/17/2014  . Hemorrhoids 04/24/2014  . Hypothyroidism 12/16/2013  . HLD (hyperlipidemia) 12/16/2013  . Benign hypertensive heart and kidney disease with diastolic CHF, NYHA class II and CKD stage III ([place]) 12/16/2013  . Allergic rhinitis 07/05/2013  . Acute kidney injury superimposed on CKD ([place]) 01/22/2013  . Adrenal insufficiency ([place]) 12/08/2011    Class: Chronic  . History of CVA (cerebrovascular accident) 12/07/2011    Class: Acute  . Dysphagia 12/07/2011    Class: Acute  . MI (myocardial  infarction) ([place])   . RA (rheumatoid arthritis) ([place])   . PUD (peptic ulcer disease)   . Hiatal hernia with gastroesophageal reflux   . Hiatal hernia   . Osteoporosis   . Depression   . Anxiety     Past Surgical History:  Procedure Laterality Date  . ANKLE SURGERY    . BACK SURGERY    . [place]   x3  . CARDIAC CATHETERIZATION     SHOWED RUPTURE PLAQUE IN THE LAD. THE LAD IS NONOBSTRUCTIVE WITH ONLY 30-40% STENOSIS  . COLONOSCOPY    . KNEE SURGERY    . NSTEMI  06/2010  . SPINAL FUSION SURGERY      OB History    No data available       Home Medications    Prior to Admission medications   Medication Sig Start Date End Date Taking? Authorizing Provider  acetaminophen (TYLENOL) 650 MG CR tablet Take 650 mg by mouth every 6 (six) hours as needed (fever greater than 100.72F).   Yes [provider]  apixaban (ELIQUIS) 2.5 MG TABS tablet Take 1 tablet (2.5 mg total) by mouth 2 (two) times daily. 11/19/16  Yes Sheikh, Omair Latif, DO  calcium-vitamin D (OSCAL WITH D) 500-200 MG-UNIT per tablet Take 1 tablet by mouth 3 (three) times daily.   Yes [provider]  carboxymethylcellulose 1 % ophthalmic solution Apply 1 drop to eye 3 (three) times daily. Both eyes   Yes [provider]  cetirizine (ZYRTEC) 10 MG tablet Take 10 mg by mouth daily.    Yes [provider]  Cholecalciferol (VITAMIN D) 2000 UNITS CAPS Take 1 capsule (2,000 Units total) by mouth daily. 11/21/13  Yes [name], NP  Cranberry 200 MG CAPS Take 2 capsules by mouth 2 (two) times daily.    Yes [provider]  denosumab (PROLIA) 60 MG/ML SOLN injection Inject 60 mg into the skin every 6 (six) months. Administer in upper arm, thigh, or abdomen   Yes [provider]  erythromycin ophthalmic ointment Place 1 application into the left eye every 6 (six) hours.   Yes [provider]  esomeprazole (NEXIUM) 40 MG capsule Take 40 mg by mouth 2 (two)  times daily before a meal.    Yes [provider]  ferrous sulfate 325 (65 FE) MG tablet Take 325 mg by mouth daily.    Yes [provider]  furosemide (LASIX) 40 MG tablet Take 1 tablet (40 mg total) by mouth daily. 11/20/16  Yes Sheikh, Omair Latif, DO  guaiFENesin (ROBITUSSIN) 100 MG/5ML liquid Take 200 mg by mouth 4 (four) times daily as needed for cough.   Yes [provider]  HYDROcodone-acetaminophen (NORCO/VICODIN) 5-325 MG tablet Take 1 tablet by mouth daily. Patient taking differently: Take 1 tablet by mouth daily as needed for severe pain.  11/19/16  Yes Sheikh, Omair Latif, DO  ipratropium (ATROVENT) 0.03 % nasal spray  Place 2 sprays into both nostrils daily as needed (For asthma).    Yes [provider]  ipratropium-albuterol (DUONEB) 0.5-2.5 (3) MG/3ML SOLN Take 3 mLs by nebulization every 8 (eight) hours as needed (wheezing).    Yes [provider]  levothyroxine (SYNTHROID, LEVOTHROID) 25 MCG tablet Take 1 tablet (25 mcg total) by mouth daily before breakfast. 09/19/16  Yes Rosita Fire, MD  lidocaine (LIDODERM) 5 % Place 1 patch onto the skin daily. Remove & Discard patch within 12 hours or as directed by MD 04/04/15  Yes Lauree Chandler, NP  LORazepam (ATIVAN) 0.5 MG tablet Take one tablet by mouth every night at bedtime for anxiety 11/19/16  Yes Sheikh, Omair Latif, DO  meclizine (ANTIVERT) 12.5 MG tablet Take 12.5 mg by mouth 2 (two) times daily.    Yes [provider]  Menthol, Topical Analgesic, (BIOFREEZE) 4 % GEL Apply 1 application topically 2 (two) times daily. For wrist pain    Yes [provider]  metoprolol (LOPRESSOR) 50 MG tablet Take 50 mg by mouth 2 (two) times daily.    Yes [provider]  mirtazapine (REMERON) 15 MG tablet Take 15 mg by mouth at bedtime.   Yes [provider]  montelukast (SINGULAIR) 10 MG tablet Take 10 mg by mouth at bedtime.    Yes [provider]    Multiple Vitamins-Minerals (CERTAVITE/ANTIOXIDANTS PO) Take 1 tablet by mouth daily.    Yes [provider]  nitrofurantoin, macrocrystal-monohydrate, (MACROBID) 100 MG capsule Take 100 mg by mouth daily.    Yes [provider]  ondansetron (ZOFRAN) 4 MG tablet Take 4 mg by mouth every 6 (six) hours as needed for nausea or vomiting.   Yes [provider]  OXYGEN Inhale 2 L into the lungs as needed (To maintain saturations >90%).    Yes [provider]  PATADAY 0.2 % SOLN Place 1 drop into both eyes daily.  03/26/15  Yes [provider]  potassium chloride SA (K-DUR,KLOR-CON) 20 MEQ tablet Take 1 tablet (20 mEq total) by mouth daily. 09/19/16  Yes Rosita Fire, MD  predniSONE (DELTASONE) 10 MG tablet Take 10 mg by mouth daily. For RA 03/08/15  Yes [provider]  promethazine (PHENERGAN) 25 MG tablet Take 12.5-25 mg by mouth every 6 (six) hours as needed for nausea or vomiting.   Yes [provider]  rosuvastatin (CRESTOR) 20 MG tablet Take 20 mg by mouth every evening. For hyperlipidemia 07/05/13  Yes [name], NP  saccharomyces boulardii (FLORASTOR) 250 MG capsule Take 250 mg by mouth 2 (two) times daily.   Yes [provider]  sertraline (ZOLOFT) 50 MG tablet Take 50 mg by mouth every morning. For depression   Yes [provider]  nitroGLYCERIN (NITROSTAT) 0.4 MG SL tablet Place 0.4 mg under the tongue every 5 (five) minutes as needed for chest pain.    [provider]  ondansetron (ZOFRAN ODT) 4 MG disintegrating tablet Take 1 tablet (4 mg total) by mouth every 8 (eight) hours as needed for nausea or vomiting. 03/03/17   Quintella Reichert, MD    Family History Family History  Problem Relation Age of Onset  . Kidney disease Mother   . Kidney disease Brother   . Lung cancer Father     Social History Social History  Substance Use Topics  . Smoking status: Never Smoker  . Smokeless tobacco:  Never Used  . Alcohol use No  Allergies   Codeine; Sulfa antibiotics; Biphosphate; Morphine and related; Percocet [oxycodone-acetaminophen]; and Plavix [clopidogrel bisulfate]   Review of Systems Review of Systems  Constitutional: Positive for fever.  Gastrointestinal: Positive for vomiting.  Neurological: Positive for weakness.  All other systems reviewed and are negative.    Physical Exam Updated Vital Signs BP 117/64   Pulse 88   Temp 98.9 F (37.2 C) (Oral)   Resp 16   Ht 5\' 6"  (1.676 m)   Wt 54.4 kg (120 lb)   LMP  (LMP Unknown)   SpO2 99%   BMI 19.37 kg/m   Physical Exam  Constitutional: She appears well-developed and well-nourished.  HENT:  Head: Normocephalic and atraumatic.  Cardiovascular: Normal rate and regular rhythm.   No murmur heard. Pulmonary/Chest: Effort normal and breath sounds normal. No respiratory distress.  Abdominal: Soft. There is tenderness. There is no rebound and no guarding.  Mild-to-moderate diffuse abdominal tenderness  Musculoskeletal: She exhibits no edema or tenderness.  Neurological: She is alert.  Generalized weakness. Disoriented to time.  Skin: Skin is warm and dry.  Psychiatric: She has a normal mood and affect. Her behavior is normal.  Nursing note and vitals reviewed.    ED Treatments / Results  Labs (all labs ordered are listed, but only abnormal results are displayed) Labs Reviewed  COMPREHENSIVE METABOLIC PANEL - Abnormal; Notable for the following:       Result Value   Glucose, Bld 144 (*)    BUN 31 (*)    Creatinine, Ser 1.56 (*)    Calcium 8.0 (*)    Total Protein 5.3 (*)    Albumin 2.6 (*)    ALT 13 (*)    GFR calc non Af Amer 29 (*)    GFR calc Af Amer 34 (*)    All other components within normal limits  CBC WITH DIFFERENTIAL/PLATELET - Abnormal; Notable for the following:    WBC 15.0 (*)    RBC 3.42 (*)    Hemoglobin 9.6 (*)    HCT 30.1 (*)    RDW 16.2 (*)    Neutro Abs 13.8 (*)    Lymphs  Abs 0.6 (*)    All other components within normal limits  URINALYSIS, ROUTINE W REFLEX MICROSCOPIC - Abnormal; Notable for the following:    Color, Urine STRAW (*)    All other components within normal limits  URINE CULTURE  CULTURE, BLOOD (ROUTINE X 2)  CULTURE, BLOOD (ROUTINE X 2)  I-STAT CG4 LACTIC ACID, ED  I-STAT CG4 LACTIC ACID, ED    EKG  EKG Interpretation None       Radiology Ct Abdomen Pelvis Wo Contrast  Result Date: 03/03/2017 CLINICAL DATA:  Abdominal pain, fever, vomiting EXAM: CT ABDOMEN AND PELVIS WITHOUT CONTRAST TECHNIQUE: Multidetector CT imaging of the abdomen and pelvis was performed following the standard protocol without IV contrast. COMPARISON:  11/28/2012 FINDINGS: Lower chest: Mild compressive atelectasis in the bilateral lower lobes. Hepatobiliary: Unenhanced liver is unremarkable. Gallbladder is unremarkable. No intrahepatic extrahepatic duct dilatation. Pancreas: Parenchymal atrophy. Spleen: Within normal limits. Adrenals/Urinary Tract: Adrenal glands are within normal limits. Kidneys are within normal limits. No renal, ureteral, or bladder calculi. No hydronephrosis. Bladder is within normal limits. Stomach/Bowel: Stomach is notable for a large hiatal hernia/inverted intrathoracic stomach. No evidence of bowel obstruction. Appendix is not discretely visualized. Scattered colonic diverticulosis, without evidence of diverticulitis. Vascular/Lymphatic: No evidence of abdominal aortic aneurysm. Atherosclerotic calcifications of the abdominal aorta and branch vessels. No suspicious abdominopelvic lymphadenopathy.  Reproductive: Status post hysterectomy. No adnexal masses. Other: No abdominopelvic ascites. Musculoskeletal: Thoracolumbar fixation rods. Moderate superior endplate compression fracture deformity at T10, unchanged. Multilevel degenerative changes, most prominent at L5-S1. Suspected prior bone harvest involving the right iliac bone. Posttraumatic deformity  involving the right superior and inferior pubic rami, chronic. IMPRESSION: Scattered colonic diverticulosis, without evidence of diverticulitis. No CT findings to account for the patient's abdominal pain. No evidence of bowel obstruction. Large hiatal hernia/ inverted intrathoracic stomach. Additional stable ancillary findings as above. Electronically Signed   By: Julian Hy M.D.   On: 03/03/2017 17:09   Dg Chest 2 View  Result Date: 03/03/2017 CLINICAL DATA:  Febrile, nausea and vomiting. EXAM: CHEST  2 VIEW COMPARISON:  Chest radiograph Nov 13, 2016 FINDINGS: Cardiomediastinal silhouette is unremarkable for this low inspiratory examination with crowded vasculature markings. Calcified aortic knob. The lungs are clear without pleural effusions or focal consolidations. Moderate hiatal hernia. Trachea projects midline and there is no pneumothorax. Included soft tissue planes and osseous structures are non-suspicious. Thoracolumbar Harrington rods. IMPRESSION: Stable examination:  No acute cardiopulmonary process. Aortic Atherosclerosis (ICD10-I70.0). Electronically Signed   By: Elon Alas M.D.   On: 03/03/2017 19:15    Procedures Procedures (including critical care time)  Medications Ordered in ED Medications  sodium chloride 0.9 % bolus 1,000 mL (0 mLs Intravenous Stopped 03/03/17 1550)  iopamidol (ISOVUE-300) 61 % injection (30 mLs  Contrast Given 03/03/17 1504)     Initial Impression / Assessment and Plan / ED Course  I have reviewed the triage vital signs and the nursing notes.  Pertinent labs & imaging results that were available during my care of the patient were reviewed by me and considered in my medical decision making (see chart for details).     Patient here for evaluation of a week and a half of fever, nausea, vomiting, diarrhea. She does have abdominal tenderness on examination without any peritoneal findings.her daughter is concerned due to history of sepsis and the  patient. She did take her morning steroids. She is chronically ill appearing on examination. No evidence of cellulitis. Given her abdominal tenderness and to obtain CT abdomen to further evaluate.  CT abdomen is negative for acute disease process. On repeat assessment she has no complaints. She states that she has been experiencing recurrent vomiting for months. She has no fever in the emergency department and no clinical findings concerning for sepsis or acute infectious process. She does have a leukocytosis on her CBC and she has this frequently in the past. UA is not consistent with UTI. There is no skin findings concerning for cellulitis. Counseled patient and daughter on home care, close outpatient follow-up as well as return precautions for any new or concerning symptoms.  Final Clinical Impressions(s) / ED Diagnoses   Final diagnoses:  Non-intractable vomiting with nausea, unspecified vomiting type  Generalized weakness    New Prescriptions Discharge Medication List as of 03/03/2017  5:47 PM    START taking these medications   Details  ondansetron (ZOFRAN ODT) 4 MG disintegrating tablet Take 1 tablet (4 mg total) by mouth every 8 (eight) hours as needed for nausea or vomiting., Starting Tue 03/03/2017, Print         Quintella Reichert, MD 03/04/17 [phone]

## 2017-03-03 NOTE — Discharge Instructions (Signed)
The cause of [name]'s symptoms was not identified today. Please have her rechecked immediately if she has any new or concerning symptoms.

## 2017-03-03 NOTE — ED Notes (Signed)
Spoke with [facility] and informed her of findings and that patient is being transported back. She verbalized understanding.

## 2017-03-03 NOTE — ED Notes (Signed)
Patient transported to CT 

## 2017-03-03 NOTE — ED Triage Notes (Signed)
Pt arrived via ems from [facility] with c/o fever 101.6 oral, decreased appetite, and vomiting. Facility gave her Phenergan to aid with nausea. Per ems she received 10104ml IVF. #20 LAC. Patient's daughter was onsite at transport and states this is the way her mom acts when she is septic and has a severe infection. Facility also is concerned with left eye and right foot. VS: 122/64, 88, 98% RA, 18 resp, CBG 220, temp 101.0. Pt is A&Ox4, neuro intact. Daughter is on the way here.

## 2017-03-03 NOTE — ED Notes (Signed)
Bed: CQ19 Expected date:  Expected time:  Means of arrival:  Comments: EMS-diarrhea

## 2017-03-03 NOTE — ED Notes (Signed)
Bed: WHALD Expected date:  Expected time:  Means of arrival:  Comments: EMS 

## 2017-03-04 DIAGNOSIS — E559 Vitamin D deficiency, unspecified: Secondary | ICD-10-CM | POA: Diagnosis not present

## 2017-03-04 DIAGNOSIS — E039 Hypothyroidism, unspecified: Secondary | ICD-10-CM | POA: Diagnosis not present

## 2017-03-04 DIAGNOSIS — M6281 Muscle weakness (generalized): Secondary | ICD-10-CM | POA: Diagnosis not present

## 2017-03-04 LAB — URINE CULTURE: Culture: NO GROWTH

## 2017-03-05 DIAGNOSIS — M6281 Muscle weakness (generalized): Secondary | ICD-10-CM | POA: Diagnosis not present

## 2017-03-05 DIAGNOSIS — N390 Urinary tract infection, site not specified: Secondary | ICD-10-CM | POA: Diagnosis not present

## 2017-03-06 DIAGNOSIS — M6281 Muscle weakness (generalized): Secondary | ICD-10-CM | POA: Diagnosis not present

## 2017-03-08 LAB — CULTURE, BLOOD (ROUTINE X 2)
Culture: NO GROWTH
Special Requests: ADEQUATE

## 2017-03-09 DIAGNOSIS — M6281 Muscle weakness (generalized): Secondary | ICD-10-CM | POA: Diagnosis not present

## 2017-03-10 DIAGNOSIS — M6281 Muscle weakness (generalized): Secondary | ICD-10-CM | POA: Diagnosis not present

## 2017-03-11 DIAGNOSIS — I70235 Atherosclerosis of native arteries of right leg with ulceration of other part of foot: Secondary | ICD-10-CM | POA: Diagnosis not present

## 2017-03-11 DIAGNOSIS — M6281 Muscle weakness (generalized): Secondary | ICD-10-CM | POA: Diagnosis not present

## 2017-03-12 DIAGNOSIS — M6281 Muscle weakness (generalized): Secondary | ICD-10-CM | POA: Diagnosis not present

## 2017-03-13 DIAGNOSIS — M6281 Muscle weakness (generalized): Secondary | ICD-10-CM | POA: Diagnosis not present

## 2017-03-16 DIAGNOSIS — M6281 Muscle weakness (generalized): Secondary | ICD-10-CM | POA: Diagnosis not present

## 2017-03-17 DIAGNOSIS — M6281 Muscle weakness (generalized): Secondary | ICD-10-CM | POA: Diagnosis not present

## 2017-03-18 DIAGNOSIS — M6281 Muscle weakness (generalized): Secondary | ICD-10-CM | POA: Diagnosis not present

## 2017-03-19 DIAGNOSIS — M6281 Muscle weakness (generalized): Secondary | ICD-10-CM | POA: Diagnosis not present

## 2017-03-20 DIAGNOSIS — M6281 Muscle weakness (generalized): Secondary | ICD-10-CM | POA: Diagnosis not present

## 2017-03-23 DIAGNOSIS — M6281 Muscle weakness (generalized): Secondary | ICD-10-CM | POA: Diagnosis not present

## 2017-03-24 DIAGNOSIS — M6281 Muscle weakness (generalized): Secondary | ICD-10-CM | POA: Diagnosis not present

## 2017-03-25 DIAGNOSIS — M6281 Muscle weakness (generalized): Secondary | ICD-10-CM | POA: Diagnosis not present

## 2017-03-26 DIAGNOSIS — M6281 Muscle weakness (generalized): Secondary | ICD-10-CM | POA: Diagnosis not present

## 2017-03-27 DIAGNOSIS — M6281 Muscle weakness (generalized): Secondary | ICD-10-CM | POA: Diagnosis not present

## 2017-03-30 DIAGNOSIS — M6281 Muscle weakness (generalized): Secondary | ICD-10-CM | POA: Diagnosis not present

## 2017-03-30 DIAGNOSIS — I70235 Atherosclerosis of native arteries of right leg with ulceration of other part of foot: Secondary | ICD-10-CM | POA: Diagnosis not present

## 2017-03-30 DIAGNOSIS — R488 Other symbolic dysfunctions: Secondary | ICD-10-CM | POA: Diagnosis not present

## 2017-03-30 DIAGNOSIS — R293 Abnormal posture: Secondary | ICD-10-CM | POA: Diagnosis not present

## 2017-03-31 DIAGNOSIS — R488 Other symbolic dysfunctions: Secondary | ICD-10-CM | POA: Diagnosis not present

## 2017-03-31 DIAGNOSIS — M6281 Muscle weakness (generalized): Secondary | ICD-10-CM | POA: Diagnosis not present

## 2017-03-31 DIAGNOSIS — R293 Abnormal posture: Secondary | ICD-10-CM | POA: Diagnosis not present

## 2017-04-02 DIAGNOSIS — E039 Hypothyroidism, unspecified: Secondary | ICD-10-CM | POA: Diagnosis not present

## 2017-04-02 DIAGNOSIS — I679 Cerebrovascular disease, unspecified: Secondary | ICD-10-CM | POA: Diagnosis not present

## 2017-04-02 DIAGNOSIS — M069 Rheumatoid arthritis, unspecified: Secondary | ICD-10-CM | POA: Diagnosis not present

## 2017-04-02 DIAGNOSIS — F411 Generalized anxiety disorder: Secondary | ICD-10-CM | POA: Diagnosis not present

## 2017-04-06 DIAGNOSIS — I70235 Atherosclerosis of native arteries of right leg with ulceration of other part of foot: Secondary | ICD-10-CM | POA: Diagnosis not present

## 2017-04-09 DIAGNOSIS — R488 Other symbolic dysfunctions: Secondary | ICD-10-CM | POA: Diagnosis not present

## 2017-04-09 DIAGNOSIS — R293 Abnormal posture: Secondary | ICD-10-CM | POA: Diagnosis not present

## 2017-04-09 DIAGNOSIS — M6281 Muscle weakness (generalized): Secondary | ICD-10-CM | POA: Diagnosis not present

## 2017-04-09 DIAGNOSIS — Z23 Encounter for immunization: Secondary | ICD-10-CM | POA: Diagnosis not present

## 2017-04-27 DIAGNOSIS — I70235 Atherosclerosis of native arteries of right leg with ulceration of other part of foot: Secondary | ICD-10-CM | POA: Diagnosis not present

## 2017-05-04 DIAGNOSIS — I70235 Atherosclerosis of native arteries of right leg with ulceration of other part of foot: Secondary | ICD-10-CM | POA: Diagnosis not present

## 2017-05-11 DIAGNOSIS — I70235 Atherosclerosis of native arteries of right leg with ulceration of other part of foot: Secondary | ICD-10-CM | POA: Diagnosis not present

## 2017-05-24 ENCOUNTER — Encounter: Payer: Self-pay | Admitting: *Deleted

## 2017-05-24 ENCOUNTER — Observation Stay: Payer: Medicare Other

## 2017-05-24 ENCOUNTER — Observation Stay
Admission: EM | Admit: 2017-05-24 | Discharge: 2017-05-26 | Disposition: A | Discharge status: Skilled Nursing Facility | Payer: Medicare Other | Attending: Internal Medicine | Admitting: Internal Medicine

## 2017-05-24 ENCOUNTER — Other Ambulatory Visit: Payer: Self-pay

## 2017-05-24 DIAGNOSIS — Z8711 Personal history of peptic ulcer disease: Secondary | ICD-10-CM | POA: Insufficient documentation

## 2017-05-24 DIAGNOSIS — K219 Gastro-esophageal reflux disease without esophagitis: Secondary | ICD-10-CM | POA: Insufficient documentation

## 2017-05-24 DIAGNOSIS — R2681 Unsteadiness on feet: Secondary | ICD-10-CM | POA: Diagnosis not present

## 2017-05-24 DIAGNOSIS — I130 Hypertensive heart and chronic kidney disease with heart failure and stage 1 through stage 4 chronic kidney disease, or unspecified chronic kidney disease: Secondary | ICD-10-CM | POA: Diagnosis not present

## 2017-05-24 DIAGNOSIS — E860 Dehydration: Secondary | ICD-10-CM

## 2017-05-24 DIAGNOSIS — N179 Acute kidney failure, unspecified: Secondary | ICD-10-CM | POA: Diagnosis not present

## 2017-05-24 DIAGNOSIS — N189 Chronic kidney disease, unspecified: Secondary | ICD-10-CM | POA: Diagnosis not present

## 2017-05-24 DIAGNOSIS — Z885 Allergy status to narcotic agent status: Secondary | ICD-10-CM | POA: Insufficient documentation

## 2017-05-24 DIAGNOSIS — I5032 Chronic diastolic (congestive) heart failure: Secondary | ICD-10-CM | POA: Diagnosis not present

## 2017-05-24 DIAGNOSIS — R404 Transient alteration of awareness: Secondary | ICD-10-CM | POA: Diagnosis not present

## 2017-05-24 DIAGNOSIS — I2510 Atherosclerotic heart disease of native coronary artery without angina pectoris: Secondary | ICD-10-CM | POA: Diagnosis not present

## 2017-05-24 DIAGNOSIS — I252 Old myocardial infarction: Secondary | ICD-10-CM | POA: Diagnosis not present

## 2017-05-24 DIAGNOSIS — K2970 Gastritis, unspecified, without bleeding: Secondary | ICD-10-CM | POA: Diagnosis not present

## 2017-05-24 DIAGNOSIS — Z8673 Personal history of transient ischemic attack (TIA), and cerebral infarction without residual deficits: Secondary | ICD-10-CM | POA: Diagnosis not present

## 2017-05-24 DIAGNOSIS — N289 Disorder of kidney and ureter, unspecified: Secondary | ICD-10-CM | POA: Diagnosis present

## 2017-05-24 DIAGNOSIS — R262 Difficulty in walking, not elsewhere classified: Secondary | ICD-10-CM | POA: Diagnosis not present

## 2017-05-24 DIAGNOSIS — Z86718 Personal history of other venous thrombosis and embolism: Secondary | ICD-10-CM | POA: Diagnosis not present

## 2017-05-24 DIAGNOSIS — M069 Rheumatoid arthritis, unspecified: Secondary | ICD-10-CM | POA: Diagnosis not present

## 2017-05-24 DIAGNOSIS — I129 Hypertensive chronic kidney disease with stage 1 through stage 4 chronic kidney disease, or unspecified chronic kidney disease: Secondary | ICD-10-CM | POA: Diagnosis not present

## 2017-05-24 DIAGNOSIS — R531 Weakness: Secondary | ICD-10-CM | POA: Diagnosis not present

## 2017-05-24 DIAGNOSIS — E785 Hyperlipidemia, unspecified: Secondary | ICD-10-CM | POA: Insufficient documentation

## 2017-05-24 DIAGNOSIS — F329 Major depressive disorder, single episode, unspecified: Secondary | ICD-10-CM | POA: Diagnosis not present

## 2017-05-24 DIAGNOSIS — K449 Diaphragmatic hernia without obstruction or gangrene: Secondary | ICD-10-CM | POA: Insufficient documentation

## 2017-05-24 DIAGNOSIS — Z7902 Long term (current) use of antithrombotics/antiplatelets: Secondary | ICD-10-CM | POA: Insufficient documentation

## 2017-05-24 DIAGNOSIS — R110 Nausea: Secondary | ICD-10-CM

## 2017-05-24 DIAGNOSIS — E039 Hypothyroidism, unspecified: Secondary | ICD-10-CM | POA: Insufficient documentation

## 2017-05-24 DIAGNOSIS — F419 Anxiety disorder, unspecified: Secondary | ICD-10-CM | POA: Diagnosis not present

## 2017-05-24 DIAGNOSIS — N183 Chronic kidney disease, stage 3 (moderate): Secondary | ICD-10-CM | POA: Insufficient documentation

## 2017-05-24 DIAGNOSIS — Z79899 Other long term (current) drug therapy: Secondary | ICD-10-CM | POA: Insufficient documentation

## 2017-05-24 DIAGNOSIS — R197 Diarrhea, unspecified: Secondary | ICD-10-CM

## 2017-05-24 LAB — URINALYSIS, COMPLETE (UACMP) WITH MICROSCOPIC
Bacteria, UA: NONE SEEN
Bilirubin Urine: NEGATIVE
Glucose, UA: NEGATIVE mg/dL
Hgb urine dipstick: NEGATIVE
Ketones, ur: NEGATIVE mg/dL
Leukocytes, UA: NEGATIVE
Nitrite: NEGATIVE
PH: 5 (ref 5.0–8.0)
PROTEIN: NEGATIVE mg/dL
Specific Gravity, Urine: 1.012 (ref 1.005–1.030)

## 2017-05-24 LAB — COMPREHENSIVE METABOLIC PANEL
ALBUMIN: 3.1 g/dL — AB (ref 3.5–5.0)
ALK PHOS: 71 U/L (ref 38–126)
ALT: 10 U/L — AB (ref 14–54)
AST: 22 U/L (ref 15–41)
Anion gap: 11 (ref 5–15)
BUN: 27 mg/dL — AB (ref 6–20)
CALCIUM: 10.1 mg/dL (ref 8.9–10.3)
CHLORIDE: 101 mmol/L (ref 101–111)
CO2: 27 mmol/L (ref 22–32)
CREATININE: 2.08 mg/dL — AB (ref 0.44–1.00)
GFR calc Af Amer: 24 mL/min — ABNORMAL LOW (ref >60)
GFR calc non Af Amer: 21 mL/min — ABNORMAL LOW (ref >60)
GLUCOSE: 140 mg/dL — AB (ref 65–99)
Potassium: 3.8 mmol/L (ref 3.5–5.1)
SODIUM: 139 mmol/L (ref 135–145)
Total Bilirubin: 0.8 mg/dL (ref 0.3–1.2)
Total Protein: 6.7 g/dL (ref 6.5–8.1)

## 2017-05-24 LAB — MRSA PCR SCREENING: MRSA by PCR: NEGATIVE

## 2017-05-24 LAB — CBC
HCT: 36.1 % (ref 35.0–47.0)
Hemoglobin: 11.9 g/dL — ABNORMAL LOW (ref 12.0–16.0)
MCH: 28.2 pg (ref 26.0–34.0)
MCHC: 32.9 g/dL (ref 32.0–36.0)
MCV: 85.6 fL (ref 80.0–100.0)
PLATELETS: 190 10*3/uL (ref 150–440)
RBC: 4.22 MIL/uL (ref 3.80–5.20)
RDW: 16.2 % — ABNORMAL HIGH (ref 11.5–14.5)
WBC: 10.6 10*3/uL (ref 3.6–11.0)

## 2017-05-24 LAB — LIPASE, BLOOD: LIPASE: 20 U/L (ref 11–51)

## 2017-05-24 MED ORDER — ONDANSETRON HCL 4 MG PO TABS: 4.0000 mg | ORAL_TABLET | Freq: Four times a day (QID) | ORAL | Status: DC | PRN

## 2017-05-24 MED ORDER — LEVOTHYROXINE SODIUM 50 MCG PO TABS
25.0000 ug | ORAL_TABLET | Freq: Every day | ORAL | Status: DC
  Administered 2017-05-25 – 2017-05-26 (×2): 25 ug via ORAL
  Filled 2017-05-24 (×2): qty 1

## 2017-05-24 MED ORDER — ACETAMINOPHEN 325 MG PO TABS: 650.0000 mg | ORAL_TABLET | Freq: Four times a day (QID) | ORAL | Status: DC | PRN

## 2017-05-24 MED ORDER — METOCLOPRAMIDE HCL 5 MG PO TABS
5.0000 mg | ORAL_TABLET | Freq: Three times a day (TID) | ORAL | Status: DC
  Administered 2017-05-24 – 2017-05-26 (×7): 5 mg via ORAL
  Filled 2017-05-24 (×7): qty 1

## 2017-05-24 MED ORDER — SODIUM CHLORIDE 0.9 % IV SOLN
INTRAVENOUS | Status: DC
  Administered 2017-05-24 – 2017-05-25 (×3): via INTRAVENOUS

## 2017-05-24 MED ORDER — METOPROLOL TARTRATE 50 MG PO TABS
50.0000 mg | ORAL_TABLET | Freq: Two times a day (BID) | ORAL | Status: DC
  Administered 2017-05-24: 50 mg via ORAL
  Filled 2017-05-24: qty 1

## 2017-05-24 MED ORDER — SODIUM CHLORIDE 0.9 % IV BOLUS (SEPSIS)
500.0000 mL | Freq: Once | INTRAVENOUS | Status: AC
  Administered 2017-05-24: 500 mL via INTRAVENOUS

## 2017-05-24 MED ORDER — POLYVINYL ALCOHOL 1.4 % OP SOLN
1.0000 [drp] | Freq: Three times a day (TID) | OPHTHALMIC | Status: DC | PRN
  Administered 2017-05-25 – 2017-05-26 (×4): 1 [drp] via OPHTHALMIC
  Filled 2017-05-24: qty 15

## 2017-05-24 MED ORDER — APIXABAN 2.5 MG PO TABS
2.5000 mg | ORAL_TABLET | Freq: Two times a day (BID) | ORAL | Status: DC
  Administered 2017-05-24 – 2017-05-25 (×3): 2.5 mg via ORAL
  Filled 2017-05-24 (×3): qty 1

## 2017-05-24 MED ORDER — NITROGLYCERIN 0.4 MG SL SUBL: 0.4000 mg | SUBLINGUAL_TABLET | SUBLINGUAL | Status: DC | PRN

## 2017-05-24 MED ORDER — ONDANSETRON HCL 4 MG/2ML IJ SOLN: 4.0000 mg | Freq: Four times a day (QID) | INTRAMUSCULAR | Status: DC | PRN

## 2017-05-24 MED ORDER — SERTRALINE HCL 50 MG PO TABS
50.0000 mg | ORAL_TABLET | Freq: Every morning | ORAL | Status: DC
  Administered 2017-05-25 – 2017-05-26 (×2): 50 mg via ORAL
  Filled 2017-05-24 (×2): qty 1

## 2017-05-24 MED ORDER — BISACODYL 10 MG RE SUPP: 10.0000 mg | Freq: Every day | RECTAL | Status: DC | PRN

## 2017-05-24 MED ORDER — MONTELUKAST SODIUM 10 MG PO TABS
10.0000 mg | ORAL_TABLET | Freq: Every day | ORAL | Status: DC
  Administered 2017-05-24 – 2017-05-25 (×2): 10 mg via ORAL
  Filled 2017-05-24 (×2): qty 1

## 2017-05-24 MED ORDER — ROSUVASTATIN CALCIUM 10 MG PO TABS
20.0000 mg | ORAL_TABLET | Freq: Every evening | ORAL | Status: DC
  Administered 2017-05-24 – 2017-05-25 (×2): 20 mg via ORAL
  Filled 2017-05-24 (×2): qty 2

## 2017-05-24 MED ORDER — PROMETHAZINE HCL 25 MG/ML IJ SOLN
12.5000 mg | Freq: Once | INTRAMUSCULAR | Status: AC
  Administered 2017-05-24: 12.5 mg via INTRAVENOUS
  Filled 2017-05-24: qty 1

## 2017-05-24 MED ORDER — LORAZEPAM 0.5 MG PO TABS: 0.5000 mg | ORAL_TABLET | Freq: Four times a day (QID) | ORAL | Status: DC | PRN

## 2017-05-24 MED ORDER — ACETAMINOPHEN 650 MG RE SUPP: 650.0000 mg | Freq: Four times a day (QID) | RECTAL | Status: DC | PRN

## 2017-05-24 MED ORDER — PREDNISONE 20 MG PO TABS
10.0000 mg | ORAL_TABLET | Freq: Every day | ORAL | Status: DC
  Administered 2017-05-24 – 2017-05-26 (×3): 10 mg via ORAL
  Filled 2017-05-24 (×3): qty 1

## 2017-05-24 MED ORDER — OLOPATADINE HCL 0.1 % OP SOLN
1.0000 [drp] | Freq: Two times a day (BID) | OPHTHALMIC | Status: DC
  Administered 2017-05-24 – 2017-05-25 (×3): 1 [drp] via OPHTHALMIC
  Filled 2017-05-24 (×2): qty 5

## 2017-05-24 MED ORDER — LIDOCAINE 5 % EX PTCH
1.0000 | MEDICATED_PATCH | CUTANEOUS | Status: DC
  Administered 2017-05-25 – 2017-05-26 (×2): 1 via TRANSDERMAL
  Filled 2017-05-24 (×2): qty 1

## 2017-05-24 MED ORDER — DOCUSATE SODIUM 100 MG PO CAPS
100.0000 mg | ORAL_CAPSULE | Freq: Two times a day (BID) | ORAL | Status: DC
  Administered 2017-05-25 – 2017-05-26 (×2): 100 mg via ORAL
  Filled 2017-05-24 (×5): qty 1

## 2017-05-24 MED ORDER — PANTOPRAZOLE SODIUM 40 MG IV SOLR
40.0000 mg | Freq: Two times a day (BID) | INTRAVENOUS | Status: DC
  Administered 2017-05-24: 40 mg via INTRAVENOUS
  Filled 2017-05-24: qty 40

## 2017-05-24 MED ORDER — IPRATROPIUM-ALBUTEROL 0.5-2.5 (3) MG/3ML IN SOLN
3.0000 mL | Freq: Three times a day (TID) | RESPIRATORY_TRACT | Status: DC | PRN
Start: 2017-05-24 — End: 2017-05-26

## 2017-05-24 MED ORDER — NITROFURANTOIN MONOHYD MACRO 100 MG PO CAPS: 100.0000 mg | ORAL_CAPSULE | Freq: Every day | ORAL | Status: DC

## 2017-05-24 MED ORDER — IPRATROPIUM BROMIDE 0.03 % NA SOLN
2.0000 | Freq: Every day | NASAL | Status: DC | PRN
  Filled 2017-05-24: qty 30

## 2017-05-24 MED ORDER — HYDROCODONE-ACETAMINOPHEN 5-325 MG PO TABS: 1.0000 | ORAL_TABLET | Freq: Four times a day (QID) | ORAL | Status: DC | PRN

## 2017-05-24 MED ORDER — MIRTAZAPINE 15 MG PO TABS
15.0000 mg | ORAL_TABLET | Freq: Every day | ORAL | Status: DC
  Administered 2017-05-24 – 2017-05-25 (×2): 15 mg via ORAL
  Filled 2017-05-24 (×2): qty 1

## 2017-05-24 MED ORDER — LORATADINE 10 MG PO TABS
10.0000 mg | ORAL_TABLET | Freq: Every day | ORAL | Status: DC
  Administered 2017-05-25 – 2017-05-26 (×2): 10 mg via ORAL
  Filled 2017-05-24 (×2): qty 1

## 2017-05-24 MED ORDER — MECLIZINE HCL 12.5 MG PO TABS
12.5000 mg | ORAL_TABLET | Freq: Two times a day (BID) | ORAL | Status: DC
  Administered 2017-05-24 – 2017-05-26 (×4): 12.5 mg via ORAL
  Filled 2017-05-24 (×5): qty 1

## 2017-05-24 MED ORDER — ONDANSETRON HCL 4 MG/2ML IJ SOLN
4.0000 mg | Freq: Once | INTRAMUSCULAR | Status: AC
Start: 2017-05-24 — End: 2017-05-24
  Administered 2017-05-24: 4 mg via INTRAVENOUS
  Filled 2017-05-24: qty 2

## 2017-05-24 NOTE — ED Provider Notes (Signed)
[facility] Emergency Department Provider Note ____________________________________________   First MD Initiated Contact with Patient 05/24/17 1145     (approximate)  I have reviewed the triage vital signs and the nursing notes.   HISTORY  Chief Complaint Nausea and Emesis    HPI [name] is a 83 y.o. female with past medical history as noted below who reports nausea and vomiting persistently over many months, and occurring today as well.  Patient states that she has had some diarrhea over the last several days, but denies any other change in her symptoms.  She reports increased generalized weakness and decreased appetite.  She gets Phenergan regularly, but states that she has not gone that today.  Past Medical History:  Diagnosis Date  . Anxiety   . Arthritis   . Chronic diastolic CHF (congestive heart failure) ([place])    a. 10/2014 Echo: EF 60-65%, mild LVH, grade 1 DD, dynamic obstruction @ rest - peak velocity of 271 cm/sec, peak grad of 53mmHg, PASP 71mmHg.  Marland Kitchen Coronary artery disease    a. 2012 s/p NSTEMI/Cath: 95% apical LAD dzs, otw nonobs dzs-->Med Rx;  b. 05/2011 MV: no ischemia, EF 79%, inf infarct- attenuation.  . CVA (cerebral vascular accident) ([place])   . Depression   . DVT of lower extremity (deep venous thrombosis) ([place])   . Dyspnea   . Elevated troponin    a. 10/2014 in setting of sepsis/?HCAP.  Marland Kitchen Enteritis due to Clostridium difficile 01/04/2013  . Essential hypertension   . Fall   . Fatigue   . GERD (gastroesophageal reflux disease)   . GI bleed 2005  . HCAP (healthcare-associated pneumonia) 01/22/2013  . Hemorrhoids   . Hiatal hernia   . Hyperlipidemia   . Hypothyroidism   . Malnutrition ([place])   . Obstructive hypertrophic cardiomyopathy ([place])    a. 10/2014 Echo: EF 60-65%, mild LVH, grade 1 DD, dynamic obstruction @ rest - peak velocity of 271 cm/sec, peak grad of 19mmHg, PASP 76mmHg.  . Osteoporosis   . PUD (peptic ulcer  disease)   . RA (rheumatoid arthritis) ([place])   . Recurrent colitis due to Clostridium difficile 06/02/2012  . Renal insufficiency   . Right ear pain   . Sepsis ([place]) 12/19/2012   UTIs  . TIA (transient ischemic attack)   . Weight loss     Patient Active Problem List   Diagnosis Date Noted  . Hyperglycemia 11/14/2016  . History of DVT (deep vein thrombosis) 11/13/2016  . Sepsis ([place]) 09/15/2016  . Wrist swelling, left 09/15/2016  . Acute encephalopathy 09/15/2016  . Pain and swelling of left wrist   . UTI (urinary tract infection) 04/08/2016  . Benign paroxysmal positional vertigo 12/19/2015  . Easy bruising 09/20/2015  . CKD (chronic kidney disease) stage 3, GFR 30-59 ml/min (HCC) 07/27/2015  . Anemia of chronic disease 07/27/2015  . Pulmonary hypertension ([place])   . Generalized osteoarthritis 04/11/2015  . Thyroid activity decreased 04/11/2015  . Coronary artery disease   . Obstructive hypertrophic cardiomyopathy ([place])   . Chronic diastolic CHF (congestive heart failure) ([place])   . HCAP (healthcare-associated pneumonia) 11/24/2014  . Vertigo 05/17/2014  . Hemorrhoids 04/24/2014  . Hypothyroidism 12/16/2013  . HLD (hyperlipidemia) 12/16/2013  . Benign hypertensive heart and kidney disease with diastolic CHF, NYHA class II and CKD stage III ([place]) 12/16/2013  . Allergic rhinitis 07/05/2013  . Acute kidney injury superimposed on CKD ([place]) 01/22/2013  . Adrenal insufficiency ([place]) 12/08/2011    Class: Chronic  .  History of CVA (cerebrovascular accident) 12/07/2011    Class: Acute  . Dysphagia 12/07/2011    Class: Acute  . MI (myocardial infarction) ([place])   . RA (rheumatoid arthritis) ([place])   . PUD (peptic ulcer disease)   . Hiatal hernia with gastroesophageal reflux   . Hiatal hernia   . Osteoporosis   . Depression   . Anxiety     Past Surgical History:  Procedure Laterality Date  . ANKLE SURGERY    . BACK SURGERY    . [place]   x3  . CARDIAC  CATHETERIZATION     SHOWED RUPTURE PLAQUE IN THE LAD. THE LAD IS NONOBSTRUCTIVE WITH ONLY 30-40% STENOSIS  . COLONOSCOPY    . KNEE SURGERY    . NSTEMI  06/2010  . SPINAL FUSION SURGERY      Prior to Admission medications   Medication Sig Start Date End Date Taking? Authorizing Provider  acetaminophen (TYLENOL) 650 MG CR tablet Take 650 mg by mouth every 6 (six) hours as needed (fever greater than 100.50F).    [provider]  apixaban (ELIQUIS) 2.5 MG TABS tablet Take 1 tablet (2.5 mg total) by mouth 2 (two) times daily. 11/19/16   Raiford Noble Latif, DO  calcium-vitamin D (OSCAL WITH D) 500-200 MG-UNIT per tablet Take 1 tablet by mouth 3 (three) times daily.    [provider]  carboxymethylcellulose 1 % ophthalmic solution Apply 1 drop to eye 3 (three) times daily. Both eyes    [provider]  cetirizine (ZYRTEC) 10 MG tablet Take 10 mg by mouth daily.     [provider]  Cholecalciferol (VITAMIN D) 2000 UNITS CAPS Take 1 capsule (2,000 Units total) by mouth daily. 11/21/13   [name], NP  Cranberry 200 MG CAPS Take 2 capsules by mouth 2 (two) times daily.     [provider]  denosumab (PROLIA) 60 MG/ML SOLN injection Inject 60 mg into the skin every 6 (six) months. Administer in upper arm, thigh, or abdomen    [provider]  erythromycin ophthalmic ointment Place 1 application into the left eye every 6 (six) hours.    [provider]  esomeprazole (NEXIUM) 40 MG capsule Take 40 mg by mouth 2 (two) times daily before a meal.     [provider]  ferrous sulfate 325 (65 FE) MG tablet Take 325 mg by mouth daily.     [provider]  furosemide (LASIX) 40 MG tablet Take 1 tablet (40 mg total) by mouth daily. 11/20/16   Raiford Noble Latif, DO  guaiFENesin (ROBITUSSIN) 100 MG/5ML liquid Take 200 mg by mouth 4 (four) times daily as needed for cough.    [provider]  HYDROcodone-acetaminophen  (NORCO/VICODIN) 5-325 MG tablet Take 1 tablet by mouth daily. Patient taking differently: Take 1 tablet by mouth daily as needed for severe pain.  11/19/16   Raiford Noble Latif, DO  ipratropium (ATROVENT) 0.03 % nasal spray Place 2 sprays into both nostrils daily as needed (For asthma).     [provider]  ipratropium-albuterol (DUONEB) 0.5-2.5 (3) MG/3ML SOLN Take 3 mLs by nebulization every 8 (eight) hours as needed (wheezing).     [provider]  levothyroxine (SYNTHROID, LEVOTHROID) 25 MCG tablet Take 1 tablet (25 mcg total) by mouth daily before breakfast. 09/19/16   Rosita Fire, MD  lidocaine (LIDODERM) 5 % Place 1 patch onto the skin daily. Remove & Discard patch within 12 hours  or as directed by MD 04/04/15   Lauree Chandler, NP  LORazepam (ATIVAN) 0.5 MG tablet Take one tablet by mouth every night at bedtime for anxiety 11/19/16   Raiford Noble Latif, DO  meclizine (ANTIVERT) 12.5 MG tablet Take 12.5 mg by mouth 2 (two) times daily.     [provider]  Menthol, Topical Analgesic, (BIOFREEZE) 4 % GEL Apply 1 application topically 2 (two) times daily. For wrist pain     [provider]  metoprolol (LOPRESSOR) 50 MG tablet Take 50 mg by mouth 2 (two) times daily.     [provider]  mirtazapine (REMERON) 15 MG tablet Take 15 mg by mouth at bedtime.    [provider]  montelukast (SINGULAIR) 10 MG tablet Take 10 mg by mouth at bedtime.     [provider]  Multiple Vitamins-Minerals (CERTAVITE/ANTIOXIDANTS PO) Take 1 tablet by mouth daily.     [provider]  nitrofurantoin, macrocrystal-monohydrate, (MACROBID) 100 MG capsule Take 100 mg by mouth daily.     [provider]  nitroGLYCERIN (NITROSTAT) 0.4 MG SL tablet Place 0.4 mg under the tongue every 5 (five) minutes as needed for chest pain.    [provider]  ondansetron (ZOFRAN ODT) 4 MG disintegrating tablet Take 1 tablet (4 mg total)  by mouth every 8 (eight) hours as needed for nausea or vomiting. 03/03/17   Quintella Reichert, MD  ondansetron (ZOFRAN) 4 MG tablet Take 4 mg by mouth every 6 (six) hours as needed for nausea or vomiting.    [provider]  OXYGEN Inhale 2 L into the lungs as needed (To maintain saturations >90%).     [provider]  PATADAY 0.2 % SOLN Place 1 drop into both eyes daily.  03/26/15   [provider]  potassium chloride SA (K-DUR,KLOR-CON) 20 MEQ tablet Take 1 tablet (20 mEq total) by mouth daily. 09/19/16   Rosita Fire, MD  predniSONE (DELTASONE) 10 MG tablet Take 10 mg by mouth daily. For RA 03/08/15   [provider]  promethazine (PHENERGAN) 25 MG tablet Take 12.5-25 mg by mouth every 6 (six) hours as needed for nausea or vomiting.    [provider]  rosuvastatin (CRESTOR) 20 MG tablet Take 20 mg by mouth every evening. For hyperlipidemia 07/05/13   [name], NP  saccharomyces boulardii (FLORASTOR) 250 MG capsule Take 250 mg by mouth 2 (two) times daily.    [provider]  sertraline (ZOLOFT) 50 MG tablet Take 50 mg by mouth every morning. For depression    [provider]    Allergies Codeine; Sulfa antibiotics; Biphosphate; Morphine and related; Percocet [oxycodone-acetaminophen]; and Plavix [clopidogrel bisulfate]  Family History  Problem Relation Age of Onset  . Kidney disease Mother   . Kidney disease Brother   . Lung cancer Father     Social History Social History   Tobacco Use  . Smoking status: Never Smoker  . Smokeless tobacco: Never Used  Substance Use Topics  . Alcohol use: No  . Drug use: No    Review of Systems  Constitutional: No fever/chills. Eyes: No redness. ENT: No sore throat. Cardiovascular: Denies chest pain. Respiratory: Denies shortness of breath. Gastrointestinal: Positive for nausea and vomiting. Genitourinary: Negative for dysuria.  Musculoskeletal: Negative for back  pain. Skin: Negative for rash. Neurological: Negative for headache.   ____________________________________________   PHYSICAL EXAM:  VITAL SIGNS: ED Triage Vitals [05/24/17 1144]  Enc Vitals Group  BP (!) 121/59     Pulse Rate 88     Resp 18     Temp 98.2 F (36.8 C)     Temp Source Oral     SpO2 99 %     Weight 134 lb 3 oz (60.9 kg)     Height 5\' 6"  (1.676 m)     Head Circumference      Peak Flow      Pain Score      Pain Loc      Pain Edu?      Excl. in [place]?     Constitutional: Alert and oriented.  Relatively well appearing for age and in no acute distress. Eyes: Conjunctivae are normal.  No scleral icterus. Head: Atraumatic. Nose: No congestion/rhinnorhea. Mouth/Throat: Mucous membranes are slightly dry.   Neck: Normal range of motion.  Cardiovascular: Normal rate, regular rhythm. Grossly normal heart sounds.  Good peripheral circulation. Respiratory: Normal respiratory effort.  No retractions. Lungs CTAB. Gastrointestinal: Soft and nontender. No distention.  Genitourinary: No CVA tenderness. Musculoskeletal: No lower extremity edema.  Extremities warm and well perfused.  Neurologic:  Normal speech and language. No gross focal neurologic deficits are appreciated.  Skin:  Skin is warm and dry. No rash noted. Psychiatric: Mood and affect are normal. Speech and behavior are normal.  ____________________________________________   LABS (all labs ordered are listed, but only abnormal results are displayed)  Labs Reviewed  COMPREHENSIVE METABOLIC PANEL - Abnormal; Notable for the following components:      Result Value   Glucose, Bld 140 (*)    BUN 27 (*)    Creatinine, Ser 2.08 (*)    Albumin 3.1 (*)    ALT 10 (*)    GFR calc non Af Amer 21 (*)    GFR calc Af Amer 24 (*)    All other components within normal limits  CBC - Abnormal; Notable for the following components:   Hemoglobin 11.9 (*)    RDW 16.2 (*)    All other components within normal limits   URINALYSIS, COMPLETE (UACMP) WITH MICROSCOPIC - Abnormal; Notable for the following components:   Color, Urine YELLOW (*)    APPearance HAZY (*)    Squamous Epithelial / LPF 0-5 (*)    All other components within normal limits  LIPASE, BLOOD   ____________________________________________  EKG  ED ECG REPORT I, Arta Silence, the attending physician, personally viewed and interpreted this ECG.  Date: 05/24/2017 EKG Time: 1208 Rate: 87 Rhythm: normal sinus rhythm QRS Axis: normal Intervals: normal ST/T Wave abnormalities: Nonspecific inferior ST abnormalities Narrative Interpretation: no evidence of acute ischemia no significant change when compared to EKG of 11/14/2016  ____________________________________________  RADIOLOGY    ____________________________________________   PROCEDURES  Procedure(s) performed: No    Critical Care performed: No ____________________________________________   INITIAL IMPRESSION / ASSESSMENT AND PLAN / ED COURSE  Pertinent labs & imaging results that were available during my care of the patient were reviewed by me and considered in my medical decision making (see chart for details).  83 year old female with past medical history as noted above presents with acute on chronic nausea and vomiting, as well as some possible recent diarrhea and weakness.  Patient is being treated with Phenergan, but states it is not been helping as much as before.  On review of past medical records in [place], she last presented to the ED on 03/03/2017 for fever, vomiting, and confusion but had negative workup and was discharged.  Admitted  earlier this year for sepsis likely due to viral syndrome and heart failure.  On exam, patient is frail and elderly, but relatively well-appearing, her vital signs are normal, and there are no significant exam findings.  The abdomen is soft and nontender.  Patient is tolerating p.o. Currently.  Most of patient's symptoms  appear to be chronic; the etiology of the chronic nausea and vomiting is somewhat unclear, however given the normal vital signs and reassuring exam, there is no evidence for colitis, diverticulitis, obstruction, or other concerning acute intra-abdominal process.  Most likely etiology for her acute symptoms would be superimposed gastroenteritis or UTI.  Will obtain basic and hepatobiliary labs, give small fluid bolus, give symptomatic treatment, obtain UA, and reassess.  No indication for imaging at this time.  Clinical Course as of May 24 1528  Sun May 24, 2017  1423 Hgb urine dipstick: NEGATIVE [SS]    Clinical Course User Index [SS] Arta Silence, MD   ----------------------------------------- 3:26 PM on 05/24/2017 -----------------------------------------  Patient had brief relief with the Phenergan, but now states that the nausea is back and is persistent.  I was able to talk to the daughter, who was able to give me additional information.  She states that the patient has had intractable nausea for approximately last month, and has been worsening the last several days to the point where she no longer wants to eat any food even things that she enjoys.  She is unable to eat most meals.  She is becoming progressively weaker, and the daughter is concerned that she is not safe in her current condition, and that the etiology of this is unknown.  Patient's lab workup reveals acute on chronic renal insufficiency.  Given her drop in GFR I believe it is appropriate to admit for gentle IV hydration and symptomatic treatment and potential further workup of the persistent nausea.    ____________________________________________   FINAL CLINICAL IMPRESSION(S) / ED DIAGNOSES  Final diagnoses:  Acute on chronic renal insufficiency  Nausea      NEW MEDICATIONS STARTED DURING THIS VISIT:  This SmartLink is deprecated. Use AVSMEDLIST instead to display the medication list for a  patient.   Note:  This document was prepared using Dragon voice recognition software and may include unintentional dictation errors.    Arta Silence, MD 05/24/17 1529

## 2017-05-24 NOTE — H&P (Signed)
History and Physical    [name]:[id number] DOB: 1929/07/27 DOA: 05/24/2017  Referring physician: Dr. Cherylann Banas PCP: Patient, No Pcp Per  Specialists: none  Chief Complaint: nausea  HPI: [name] is a 83 y.o. female has a past medical history significant for RA, HTN, CAD, and hx of CVA who presents to ER with >1 month hx of nausea and anorexia. She resides at a SNF and is not eating. Feeling weak. In ER, pt noted to be dehydrated with AKI. She is now admitted. No fever. Denies CP or SOB. No vomiting or diarrhea  Review of Systems: The patient denies  fever, weight loss,, vision loss, decreased hearing, hoarseness, chest pain, syncope, dyspnea on exertion, peripheral edema, balance deficits, hemoptysis, abdominal pain, melena, hematochezia, severe indigestion/heartburn, hematuria, incontinence, genital sores, muscle weakness, suspicious skin lesions, transient blindness, difficulty walking, depression, unusual weight change, abnormal bleeding, enlarged lymph nodes, angioedema, and breast masses.   Past Medical History:  Diagnosis Date  . Anxiety   . Arthritis   . Chronic diastolic CHF (congestive heart failure) ([place])    a. 10/2014 Echo: EF 60-65%, mild LVH, grade 1 DD, dynamic obstruction @ rest - peak velocity of 271 cm/sec, peak grad of 24mmHg, PASP 63mmHg.  Marland Kitchen Coronary artery disease    a. 2012 s/p NSTEMI/Cath: 95% apical LAD dzs, otw nonobs dzs-->Med Rx;  b. 05/2011 MV: no ischemia, EF 79%, inf infarct- attenuation.  . CVA (cerebral vascular accident) ([place])   . Depression   . DVT of lower extremity (deep venous thrombosis) ([place])   . Dyspnea   . Elevated troponin    a. 10/2014 in setting of sepsis/?HCAP.  Marland Kitchen Enteritis due to Clostridium difficile 01/04/2013  . Essential hypertension   . Fall   . Fatigue   . GERD (gastroesophageal reflux disease)   . GI bleed 2005  . HCAP (healthcare-associated pneumonia) 01/22/2013  . Hemorrhoids   . Hiatal hernia   . Hyperlipidemia    . Hypothyroidism   . Malnutrition ([place])   . Obstructive hypertrophic cardiomyopathy ([place])    a. 10/2014 Echo: EF 60-65%, mild LVH, grade 1 DD, dynamic obstruction @ rest - peak velocity of 271 cm/sec, peak grad of 74mmHg, PASP 42mmHg.  . Osteoporosis   . PUD (peptic ulcer disease)   . RA (rheumatoid arthritis) ([place])   . Recurrent colitis due to Clostridium difficile 06/02/2012  . Renal insufficiency   . Right ear pain   . Sepsis ([place]) 12/19/2012   UTIs  . TIA (transient ischemic attack)   . Weight loss    Past Surgical History:  Procedure Laterality Date  . ANKLE SURGERY    . BACK SURGERY    . [place]   x3  . CARDIAC CATHETERIZATION     SHOWED RUPTURE PLAQUE IN THE LAD. THE LAD IS NONOBSTRUCTIVE WITH ONLY 30-40% STENOSIS  . COLONOSCOPY    . KNEE SURGERY    . NSTEMI  06/2010  . SPINAL FUSION SURGERY     Social History:  reports that  has never smoked. she has never used smokeless tobacco. She reports that she does not drink alcohol or use drugs.  Allergies  Allergen Reactions  . Codeine Swelling  . Sulfa Antibiotics Other (See Comments)    GI upset  . Biphosphate Other (See Comments)  . Morphine And Related Other (See Comments)  . Percocet [Oxycodone-Acetaminophen] Other (See Comments)  . Plavix [Clopidogrel Bisulfate] Itching and Rash    Family History  Problem Relation  Age of Onset  . Kidney disease Mother   . Kidney disease Brother   . Lung cancer Father     Prior to Admission medications   Medication Sig Start Date End Date Taking? Authorizing Provider  apixaban (ELIQUIS) 2.5 MG TABS tablet Take 1 tablet (2.5 mg total) by mouth 2 (two) times daily. 11/19/16  Yes Sheikh, Omair Latif, DO  Biotin 300 MCG TABS Take 300 mcg by mouth daily.   Yes [provider]  carboxymethylcellulose 1 % ophthalmic solution Apply 1 drop to eye 3 (three) times daily. Both eyes   Yes [provider]  cetirizine (ZYRTEC) 10 MG tablet Take 10 mg by mouth  daily.    Yes [provider]  Cholecalciferol (VITAMIN D) 2000 UNITS CAPS Take 1 capsule (2,000 Units total) by mouth daily. 11/21/13  Yes [name], NP  Cranberry 200 MG CAPS Take 2 capsules by mouth 2 (two) times daily.    Yes [provider]  denosumab (PROLIA) 60 MG/ML SOLN injection Inject 60 mg into the skin every 6 (six) months. Administer in upper arm, thigh, or abdomen   Yes [provider]  esomeprazole (NEXIUM) 40 MG capsule Take 40 mg by mouth 2 (two) times daily before a meal.    Yes [provider]  ferrous sulfate 325 (65 FE) MG tablet Take 325 mg by mouth daily.    Yes [provider]  furosemide (LASIX) 40 MG tablet Take 1 tablet (40 mg total) by mouth daily. 11/20/16  Yes Sheikh, Omair Latif, DO  HYDROcodone-acetaminophen (NORCO/VICODIN) 5-325 MG tablet Take 1 tablet by mouth daily. Patient taking differently: Take 1 tablet by mouth daily as needed for severe pain.  11/19/16  Yes Sheikh, Omair Latif, DO  ipratropium (ATROVENT) 0.03 % nasal spray Place 2 sprays into both nostrils daily as needed (For asthma).    Yes [provider]  ipratropium-albuterol (DUONEB) 0.5-2.5 (3) MG/3ML SOLN Take 3 mLs by nebulization every 8 (eight) hours as needed (wheezing).    Yes [provider]  levothyroxine (SYNTHROID, LEVOTHROID) 25 MCG tablet Take 1 tablet (25 mcg total) by mouth daily before breakfast. 09/19/16  Yes Rosita Fire, MD  lidocaine (LIDODERM) 5 % Place 1 patch onto the skin daily. Remove & Discard patch within 12 hours or as directed by MD 04/04/15  Yes Lauree Chandler, NP  LORazepam (ATIVAN) 0.5 MG tablet Take one tablet by mouth every night at bedtime for anxiety 11/19/16  Yes Sheikh, Omair Latif, DO  meclizine (ANTIVERT) 12.5 MG tablet Take 12.5 mg by mouth 2 (two) times daily.    Yes [provider]  Menthol, Topical Analgesic, (BIOFREEZE) 4 % GEL Apply 1 application topically 2 (two) times  daily. For wrist pain    Yes [provider]  metoprolol (LOPRESSOR) 50 MG tablet Take 50 mg by mouth 2 (two) times daily.    Yes [provider]  mirtazapine (REMERON) 15 MG tablet Take 15 mg by mouth at bedtime.   Yes [provider]  montelukast (SINGULAIR) 10 MG tablet Take 10 mg by mouth at bedtime.    Yes [provider]  Multiple Vitamins-Minerals (CERTAVITE/ANTIOXIDANTS PO) Take 1 tablet by mouth daily.    Yes [provider]  nitrofurantoin, macrocrystal-monohydrate, (MACROBID) 100 MG capsule Take 100 mg by mouth daily.    Yes [provider]  ondansetron (ZOFRAN) 4 MG tablet Take 4 mg by mouth every 6 (six) hours as needed for nausea or  vomiting.   Yes [provider]  OXYGEN Inhale 2 L into the lungs as needed (To maintain saturations >90%).    Yes [provider]  PATADAY 0.2 % SOLN Place 1 drop into both eyes daily.  03/26/15  Yes [provider]  potassium chloride SA (K-DUR,KLOR-CON) 20 MEQ tablet Take 1 tablet (20 mEq total) by mouth daily. 09/19/16  Yes Rosita Fire, MD  predniSONE (DELTASONE) 10 MG tablet Take 10 mg by mouth daily. For RA 03/08/15  Yes [provider]  rosuvastatin (CRESTOR) 20 MG tablet Take 20 mg by mouth every evening. For hyperlipidemia 07/05/13  Yes [name], NP  sertraline (ZOLOFT) 50 MG tablet Take 50 mg by mouth every morning. For depression   Yes [provider]  nitroGLYCERIN (NITROSTAT) 0.4 MG SL tablet Place 0.4 mg under the tongue every 5 (five) minutes as needed for chest pain.    [provider]  ondansetron (ZOFRAN ODT) 4 MG disintegrating tablet Take 1 tablet (4 mg total) by mouth every 8 (eight) hours as needed for nausea or vomiting. Patient not taking: Reported on 05/24/2017 03/03/17   Quintella Reichert, MD   Physical Exam: Vitals:   05/24/17 1144  BP: (!) 121/59  Pulse: 88  Resp: 18  Temp: 98.2 F (36.8 C)  TempSrc: Oral   SpO2: 99%  Weight: 60.9 kg (134 lb 3 oz)  Height: 5\' 6"  (1.676 m)     General:  No apparent distress, WDWN, [place]/AT  Eyes: PERRL, EOMI, no scleral icterus, conjunctiva clear  ENT: moist oropharynx without exudate, TM's benign, dentition fair  Neck: supple, no lymphadenopathy. No bruits or thyromegaly  Cardiovascular: regular rate without MRG; 2+ peripheral pulses, no JVD, no peripheral edema  Respiratory: CTA biL, good air movement without wheezing, rhonchi or crackled. Respiratory effort normal  Abdomen: soft, non tender to palpation, positive bowel sounds, no guarding, no rebound  Skin: no rashes or lesions  Musculoskeletal: normal bulk and tone, no joint swelling  Psychiatric: normal mood and affect, A&OX3  Neurologic: CN 2-12 grossly intact, Motor strength 5/5 in all 4 groups with symmetric DTR's and non-focal sensory exam  Labs on Admission:  Basic Metabolic Panel: Recent Labs  Lab 05/24/17 1150  NA 139  K 3.8  CL 101  CO2 27  GLUCOSE 140*  BUN 27*  CREATININE 2.08*  CALCIUM 10.1   Liver Function Tests: Recent Labs  Lab 05/24/17 1150  AST 22  ALT 10*  ALKPHOS 71  BILITOT 0.8  PROT 6.7  ALBUMIN 3.1*   Recent Labs  Lab 05/24/17 1150  LIPASE 20   No results for input(s): AMMONIA in the last 168 hours. CBC: Recent Labs  Lab 05/24/17 1150  WBC 10.6  HGB 11.9*  HCT 36.1  MCV 85.6  PLT 190   Cardiac Enzymes: No results for input(s): CKTOTAL, CKMB, CKMBINDEX, TROPONINI in the last 168 hours.  BNP (last 3 results) Recent Labs    11/15/16 0741  BNP 1,009.7*    ProBNP (last 3 results) No results for input(s): PROBNP in the last 8760 hours.  CBG: No results for input(s): GLUCAP in the last 168 hours.  Radiological Exams on Admission: No results found.  EKG: Independently reviewed.  Assessment/Plan Principal Problem:   AKI (acute kidney injury) ([place]) Active Problems:   RA (rheumatoid arthritis) ([facility])   Dehydration   Chronic  nausea   Will observe on floor with IV fluids and begin Reglan. [place] of abdomen and GI consult  ordered. Begin IV protonix. Repeat labs in AM. Consult PT and CSW for disposition  Diet: clear liquids Fluids: NS@75  DVT Prophylaxis: SQ Heparin  Code Status: FULL  Family Communication: yes  Disposition Plan: SNF  Time spent: 50 min

## 2017-05-24 NOTE — ED Triage Notes (Signed)
Pt to ED from [facility] reporting NVD and weakness over the past 2 weeks. Pt has had similar events in the past and reports Phenergan has helped but this time phenergan has not been helping for long periods of time. Pt reports increased weakness and decreased appetite as well. Pt is alert and oriented x 4.

## 2017-05-24 NOTE — ED Notes (Signed)
Called report to [facility]

## 2017-05-25 DIAGNOSIS — M069 Rheumatoid arthritis, unspecified: Secondary | ICD-10-CM | POA: Diagnosis not present

## 2017-05-25 DIAGNOSIS — E860 Dehydration: Secondary | ICD-10-CM | POA: Diagnosis not present

## 2017-05-25 DIAGNOSIS — R110 Nausea: Secondary | ICD-10-CM | POA: Diagnosis not present

## 2017-05-25 DIAGNOSIS — R197 Diarrhea, unspecified: Secondary | ICD-10-CM | POA: Diagnosis not present

## 2017-05-25 DIAGNOSIS — K2970 Gastritis, unspecified, without bleeding: Secondary | ICD-10-CM | POA: Diagnosis not present

## 2017-05-25 DIAGNOSIS — N179 Acute kidney failure, unspecified: Secondary | ICD-10-CM | POA: Diagnosis not present

## 2017-05-25 LAB — COMPREHENSIVE METABOLIC PANEL
ALBUMIN: 2.8 g/dL — AB (ref 3.5–5.0)
ALK PHOS: 63 U/L (ref 38–126)
ALT: 10 U/L — AB (ref 14–54)
ANION GAP: 7 (ref 5–15)
AST: 20 U/L (ref 15–41)
BILIRUBIN TOTAL: 0.7 mg/dL (ref 0.3–1.2)
BUN: 25 mg/dL — AB (ref 6–20)
CALCIUM: 9.4 mg/dL (ref 8.9–10.3)
CO2: 26 mmol/L (ref 22–32)
Chloride: 107 mmol/L (ref 101–111)
Creatinine, Ser: 1.89 mg/dL — ABNORMAL HIGH (ref 0.44–1.00)
GFR calc Af Amer: 27 mL/min — ABNORMAL LOW (ref >60)
GFR calc non Af Amer: 23 mL/min — ABNORMAL LOW (ref >60)
Glucose, Bld: 146 mg/dL — ABNORMAL HIGH (ref 65–99)
Potassium: 4.3 mmol/L (ref 3.5–5.1)
SODIUM: 140 mmol/L (ref 135–145)
TOTAL PROTEIN: 5.8 g/dL — AB (ref 6.5–8.1)

## 2017-05-25 LAB — CBC
HEMATOCRIT: 33.8 % — AB (ref 35.0–47.0)
HEMOGLOBIN: 11.4 g/dL — AB (ref 12.0–16.0)
MCH: 29.1 pg (ref 26.0–34.0)
MCHC: 33.8 g/dL (ref 32.0–36.0)
MCV: 86.1 fL (ref 80.0–100.0)
Platelets: 171 10*3/uL (ref 150–440)
RBC: 3.92 MIL/uL (ref 3.80–5.20)
RDW: 16.5 % — ABNORMAL HIGH (ref 11.5–14.5)
WBC: 7.2 10*3/uL (ref 3.6–11.0)

## 2017-05-25 MED ORDER — PANTOPRAZOLE SODIUM 40 MG IV SOLR
40.0000 mg | Freq: Two times a day (BID) | INTRAVENOUS | Status: DC
  Administered 2017-05-25 – 2017-05-26 (×3): 40 mg via INTRAVENOUS
  Filled 2017-05-25 (×4): qty 40

## 2017-05-25 MED ORDER — METOPROLOL TARTRATE 50 MG PO TABS
50.0000 mg | ORAL_TABLET | Freq: Two times a day (BID) | ORAL | Status: DC
  Administered 2017-05-25 – 2017-05-26 (×3): 50 mg via ORAL
  Filled 2017-05-25 (×2): qty 1

## 2017-05-25 NOTE — Consult Note (Signed)
Samantha Lame, MD [facility]  [address], [place], [place] [address] Phone: [phone] Fax : [phone]  Consultation  Referring Provider:     Dr, Doy Hutching  Primary Care Physician:  Patient, No Pcp Per Primary Gastroenterologist:  Althia Forts         Reason for Consultation:     Nausea and vomiting  Date of Admission:  05/24/2017 Date of Consultation:  05/25/2017         HPI:   Lacole Komorowski is a 83 y.o. female has a history of dementia who comes in with a report of nausea vomiting for the last few years. The patient was recently in another hospital and thinks it was [facility] but is not sure. The patient states that she was therefore the same issues and sent home. The patient does have a history of peptic ulcer disease and states that she had an upper endoscopy in 2012. The patient states at that time she was bleeding. She is not a great historian but tries hard to remember her past history. The patient's family is with her at this time. The patient states that the vomiting and nausea comes without [place] a reason. She states that sometimes it is right after she eats and other times it is many hours after she eats. There is report of unexplained weight loss which is of 25 pounds. The patient denies any abdominal pain associated with her nausea and vomiting. She had told her family recently that she can no longer handle this amount of nausea and vomiting. There does not seem to be any particular foods that make her nausea and vomiting any worse. She also denies any black stools or bloody stools. There is no blood in her vomitus. She also reports intermittent diarrhea.  Past Medical History:  Diagnosis Date  . Anxiety   . Arthritis   . Chronic diastolic CHF (congestive heart failure) ([place])    a. 10/2014 Echo: EF 60-65%, mild LVH, grade 1 DD, dynamic obstruction @ rest - peak velocity of 271 cm/sec, peak grad of 29mmHg, PASP 54mmHg.  Marland Kitchen Coronary artery disease    a. 2012 s/p  NSTEMI/Cath: 95% apical LAD dzs, otw nonobs dzs-->Med Rx;  b. 05/2011 MV: no ischemia, EF 79%, inf infarct- attenuation.  . CVA (cerebral vascular accident) ([place])   . Depression   . DVT of lower extremity (deep venous thrombosis) ([place])   . Dyspnea   . Elevated troponin    a. 10/2014 in setting of sepsis/?HCAP.  Marland Kitchen Enteritis due to Clostridium difficile 01/04/2013  . Essential hypertension   . Fall   . Fatigue   . GERD (gastroesophageal reflux disease)   . GI bleed 2005  . HCAP (healthcare-associated pneumonia) 01/22/2013  . Hemorrhoids   . Hiatal hernia   . Hyperlipidemia   . Hypothyroidism   . Malnutrition ([place])   . Obstructive hypertrophic cardiomyopathy ([place])    a. 10/2014 Echo: EF 60-65%, mild LVH, grade 1 DD, dynamic obstruction @ rest - peak velocity of 271 cm/sec, peak grad of 27mmHg, PASP 84mmHg.  . Osteoporosis   . PUD (peptic ulcer disease)   . RA (rheumatoid arthritis) ([place])   . Recurrent colitis due to Clostridium difficile 06/02/2012  . Renal insufficiency   . Right ear pain   . Sepsis ([place]) 12/19/2012   UTIs  . TIA (transient ischemic attack)   . Weight loss     Past Surgical History:  Procedure Laterality Date  . ANKLE SURGERY    .  BACK SURGERY    . [place]   x3  . CARDIAC CATHETERIZATION     SHOWED RUPTURE PLAQUE IN THE LAD. THE LAD IS NONOBSTRUCTIVE WITH ONLY 30-40% STENOSIS  . COLONOSCOPY    . KNEE SURGERY    . NSTEMI  06/2010  . SPINAL FUSION SURGERY      Prior to Admission medications   Medication Sig Start Date End Date Taking? Authorizing Provider  apixaban (ELIQUIS) 2.5 MG TABS tablet Take 1 tablet (2.5 mg total) by mouth 2 (two) times daily. 11/19/16  Yes Sheikh, Omair Latif, DO  Biotin 300 MCG TABS Take 300 mcg by mouth daily.   Yes [provider]  carboxymethylcellulose 1 % ophthalmic solution Apply 1 drop to eye 3 (three) times daily. Both eyes   Yes [provider]  cetirizine (ZYRTEC) 10 MG tablet Take 10 mg by  mouth daily.    Yes [provider]  Cholecalciferol (VITAMIN D) 2000 UNITS CAPS Take 1 capsule (2,000 Units total) by mouth daily. 11/21/13  Yes [name], NP  Cranberry 200 MG CAPS Take 2 capsules by mouth 2 (two) times daily.    Yes [provider]  denosumab (PROLIA) 60 MG/ML SOLN injection Inject 60 mg into the skin every 6 (six) months. Administer in upper arm, thigh, or abdomen   Yes [provider]  esomeprazole (NEXIUM) 40 MG capsule Take 40 mg by mouth 2 (two) times daily before a meal.    Yes [provider]  ferrous sulfate 325 (65 FE) MG tablet Take 325 mg by mouth daily.    Yes [provider]  furosemide (LASIX) 40 MG tablet Take 1 tablet (40 mg total) by mouth daily. 11/20/16  Yes Sheikh, Omair Latif, DO  HYDROcodone-acetaminophen (NORCO/VICODIN) 5-325 MG tablet Take 1 tablet by mouth daily. Patient taking differently: Take 1 tablet by mouth daily as needed for severe pain.  11/19/16  Yes Sheikh, Omair Latif, DO  ipratropium (ATROVENT) 0.03 % nasal spray Place 2 sprays into both nostrils daily as needed (For asthma).    Yes [provider]  ipratropium-albuterol (DUONEB) 0.5-2.5 (3) MG/3ML SOLN Take 3 mLs by nebulization every 8 (eight) hours as needed (wheezing).    Yes [provider]  levothyroxine (SYNTHROID, LEVOTHROID) 25 MCG tablet Take 1 tablet (25 mcg total) by mouth daily before breakfast. 09/19/16  Yes Rosita Fire, MD  lidocaine (LIDODERM) 5 % Place 1 patch onto the skin daily. Remove & Discard patch within 12 hours or as directed by MD 04/04/15  Yes Lauree Chandler, NP  LORazepam (ATIVAN) 0.5 MG tablet Take one tablet by mouth every night at bedtime for anxiety 11/19/16  Yes Sheikh, Omair Latif, DO  meclizine (ANTIVERT) 12.5 MG tablet Take 12.5 mg by mouth 2 (two) times daily.    Yes [provider]  Menthol, Topical Analgesic, (BIOFREEZE) 4 % GEL Apply 1 application topically 2 (two)  times daily. For wrist pain    Yes [provider]  metoprolol (LOPRESSOR) 50 MG tablet Take 50 mg by mouth 2 (two) times daily.    Yes [provider]  mirtazapine (REMERON) 15 MG tablet Take 15 mg by mouth at bedtime.   Yes [provider]  montelukast (SINGULAIR) 10 MG tablet Take 10 mg by mouth at bedtime.    Yes [provider]  Multiple Vitamins-Minerals (CERTAVITE/ANTIOXIDANTS PO) Take 1 tablet by mouth daily.    Yes [provider]  nitrofurantoin, macrocrystal-monohydrate, (  MACROBID) 100 MG capsule Take 100 mg by mouth daily.    Yes [provider]  ondansetron (ZOFRAN) 4 MG tablet Take 4 mg by mouth every 6 (six) hours as needed for nausea or vomiting.   Yes [provider]  OXYGEN Inhale 2 L into the lungs as needed (To maintain saturations >90%).    Yes [provider]  PATADAY 0.2 % SOLN Place 1 drop into both eyes daily.  03/26/15  Yes [provider]  potassium chloride SA (K-DUR,KLOR-CON) 20 MEQ tablet Take 1 tablet (20 mEq total) by mouth daily. 09/19/16  Yes Rosita Fire, MD  predniSONE (DELTASONE) 10 MG tablet Take 10 mg by mouth daily. For RA 03/08/15  Yes [provider]  rosuvastatin (CRESTOR) 20 MG tablet Take 20 mg by mouth every evening. For hyperlipidemia 07/05/13  Yes [name], NP  sertraline (ZOLOFT) 50 MG tablet Take 50 mg by mouth every morning. For depression   Yes [provider]  nitroGLYCERIN (NITROSTAT) 0.4 MG SL tablet Place 0.4 mg under the tongue every 5 (five) minutes as needed for chest pain.    [provider]  ondansetron (ZOFRAN ODT) 4 MG disintegrating tablet Take 1 tablet (4 mg total) by mouth every 8 (eight) hours as needed for nausea or vomiting. Patient not taking: Reported on 05/24/2017 03/03/17   Quintella Reichert, MD    Family History  Problem Relation Age of Onset  . Kidney disease Mother   . Kidney disease Brother   . Lung cancer  Father      Social History   Tobacco Use  . Smoking status: Never Smoker  . Smokeless tobacco: Never Used  Substance Use Topics  . Alcohol use: No  . Drug use: No    Allergies as of 05/24/2017 - Review Complete 05/24/2017  Allergen Reaction Noted  . Codeine Swelling 11/11/2010  . Sulfa antibiotics Other (See Comments) 09/15/2016  . Biphosphate Other (See Comments) 11/21/2013  . Morphine and related Other (See Comments) 02/27/2012  . Percocet [oxycodone-acetaminophen] Other (See Comments) 11/13/2010  . Plavix [clopidogrel bisulfate] Itching and Rash 11/11/2010    Review of Systems:    All systems reviewed and negative except where noted in HPI.   Physical Exam:  Vital signs in last 24 hours: Temp:  [97.9 F (36.6 C)-99.6 F (37.6 C)] 98.2 F (36.8 C) (11/26 0843) Pulse Rate:  [79-99] 85 (11/26 0843) Resp:  [17-25] 18 (11/26 0843) BP: (94-142)/(46-71) 142/71 (11/26 0843) SpO2:  [92 %-99 %] 96 % (11/26 0843) Weight:  [134 lb 3 oz (60.9 kg)-138 lb 4.8 oz (62.7 kg)] 138 lb 4.8 oz (62.7 kg) (11/26 0506) Last BM Date: 05/24/17 General:   Pleasant, cooperative in NAD Head:  Normocephalic and atraumatic. Eyes:   No icterus.   Conjunctiva pink. PERRLA. Ears:  Normal auditory acuity. Neck:  Supple; no masses or thyroidomegaly Lungs: Respirations even and unlabored. Lungs clear to auscultation bilaterally.   No wheezes, crackles, or rhonchi.  Heart:  Regular rate and rhythm;  Without murmur, clicks, rubs or gallops Abdomen:  Soft, nondistended, nontender. Normal bowel sounds. No appreciable masses or hepatomegaly.  No rebound or guarding.  Rectal:  Not performed. Msk:  Symmetrical without gross deformities.    Extremities:  Without edema, cyanosis or clubbing. Neurologic:  Alert and oriented x3;  grossly normal neurologically. Skin:  Intact without significant lesions or rashes. Cervical Nodes:  No significant cervical adenopathy. Psych:  Alert and cooperative. Normal  affect.  LAB  RESULTS: Recent Labs    05/24/17 1150 05/25/17 0531  WBC 10.6 7.2  HGB 11.9* 11.4*  HCT 36.1 33.8*  PLT 190 171   BMET Recent Labs    05/24/17 1150 05/25/17 0531  NA 139 140  K 3.8 4.3  CL 101 107  CO2 27 26  GLUCOSE 140* 146*  BUN 27* 25*  CREATININE 2.08* 1.89*  CALCIUM 10.1 9.4   LFT Recent Labs    05/25/17 0531  PROT 5.8*  ALBUMIN 2.8*  AST 20  ALT 10*  ALKPHOS 63  BILITOT 0.7   PT/INR No results for input(s): LABPROT, INR in the last 72 hours.  STUDIES: US Abdomen Complete  Result Date: 05/24/2017 CLINICAL DATA:  Patient with nausea for 2 weeks. EXAM: ABDOMEN ULTRASOUND COMPLETE COMPARISON:  None. FINDINGS: Gallbladder: No gallstones or wall thickening visualized. No sonographic Murphy sign noted by sonographer. Common bile duct: Diameter: 3 mm Liver: No focal lesion identified. Within normal limits in parenchymal echogenicity. Portal vein is patent on color Doppler imaging with normal direction of blood flow towards the liver. IVC: No abnormality visualized. Pancreas: Visualized portion unremarkable. Spleen: Size and appearance within normal limits. Right Kidney: Length: 7.7 cm. Renal cortical thinning. No hydronephrosis. Left Kidney: Length: 8.3 cm. Renal cortical thinning. No hydronephrosis. Abdominal aorta: Not well visualized due to overlying bowel gas. Other findings: None. IMPRESSION: No acute process within the abdomen. No cholelithiasis or sonographic evidence for acute cholecystitis. No hydronephrosis. Electronically Signed   By: Lovey Newcomer M.D.   On: 05/24/2017 19:26      Impression / Plan:   Lance Galas is a 83 y.o. y/o female with chronic nausea for the last few years but she states it is getting worse and more frequent. She also states that the nausea and vomiting is lasting longer. The patient has been found to have some chronic renal disease which may contribute to her nausea and vomiting. The symptoms do not appear to be  gastroparesis. There may be a central cause for her symptoms. The right upper quadrant ultrasound did not show any abnormalities to explain her symptoms. The patient will be set up for an EGD for tomorrow to rule out any GI pathology as the cause of her nausea and vomiting. She will also have her stool sent off for possible pathogens as the cause of her diarrhea.   Thank you for involving me in the care of this patient.      LOS: 0 days   Samantha Lame, MD  05/25/2017, 9:28 AM   Note: This dictation was prepared with Dragon dictation along with smaller phrase technology. Any transcriptional errors that result from this process are unintentional.

## 2017-05-25 NOTE — Evaluation (Signed)
Physical Therapy Evaluation Patient Details Name: [name] MRN: [id number] DOB: [birth year] Today's Date: 05/25/2017   History of Present Illness  presented to ER secondary to persistent nausea, weakness; admitted with acute kidney injury secondary to dehydration.  Clinical Impression  Upon evaluation, patient alert and oriented; follows simple commands, but requires mod cuing from therapist for participation with session.  Chronic arthritic changes noted to bilat hands/fingers (with 4-5th digits of L hand contracted in flexed position); prefers to maintain bilat LEs in windswept position towards L in all sitting/supine positions.  Limited willingness to participate with formal ROM/MMT; demonstrating grossly at least 3-/5 strength throughout all extremities.  Currently requiring mod assist for bed mobility, sit/stand and basic transfers with RW.  Demonstrates very short, choppy steps with poor balance and control; high risk for falls.  Unsafe to attempt without RW and +1 assist at all times. Would benefit from skilled PT to address above deficits and promote optimal return to PLOF; recommend transition to STR upon discharge from acute hospitalization, pending patient's ability to actively and willingly participate/progress with skilled interventions.       Follow Up Recommendations SNF(pending willingness/ability to actively participate/progress; if not, may be more appropriate for return to facility as LTC)    Equipment Recommendations       Recommendations for Other Services       Precautions / Restrictions Precautions Precautions: Fall Restrictions Weight Bearing Restrictions: No      Mobility  Bed Mobility Overal bed mobility: Needs Assistance Bed Mobility: Supine to Sit     Supine to sit: Mod assist        Transfers Overall transfer level: Needs assistance Equipment used: Rolling walker (2 wheeled) Transfers: Sit to/from Stand Sit to Stand: Mod assist          General transfer comment: short, choppy steps; poor balance and control.  Unsafe to attempt without RW and +1 assist at all times.  Ambulation/Gait             General Gait Details: patient declined; WC level as primary mobility at baseline per patient report  Stairs            Wheelchair Mobility    Modified Rankin (Stroke Patients Only)       Balance Overall balance assessment: Needs assistance Sitting-balance support: No upper extremity supported;Feet supported Sitting balance-Leahy Scale: Fair     Standing balance support: Bilateral upper extremity supported Standing balance-Leahy Scale: Poor                               Pertinent Vitals/Pain Pain Assessment: No/denies pain    Home Living Family/patient expects to be discharged to:: Skilled nursing facility                 Additional Comments: Long-term care resident of [facility] x5 years    Prior Function Level of Independence: Needs assistance         Comments: Patient reports utilizing manual WC as primary mobility, performing SPT (with RW) to/from [facility] with staff assist; assist for ADLs, self-care activities.  Denies recent fall history.  Has not recently participated in rehab "because I didn't feel like it"     Hand Dominance        Extremity/Trunk Assessment   Upper Extremity Assessment Upper Extremity Assessment: (grossly at least 4-/5 throughout; chronic RA changes to bilat hands/fingers with 4-5 fingers of L hand contracted in flexed position)  Lower Extremity Assessment Lower Extremity Assessment: Generalized weakness(grossly at least 3/5 throughout (limited participation with formal MMT); maintains windswept position towards L in all sitting/supine positions)       Communication   Communication: No difficulties  Cognition Arousal/Alertness: Awake/alert Behavior During Therapy: WFL for tasks assessed/performed Overall Cognitive Status: Within Functional  Limits for tasks assessed                                        General Comments      Exercises     Assessment/Plan    PT Assessment Patient needs continued PT services  PT Problem List Decreased strength;Decreased range of motion;Decreased activity tolerance;Decreased balance;Decreased mobility;Decreased coordination;Decreased knowledge of use of DME;Decreased safety awareness;Decreased knowledge of precautions       PT Treatment Interventions DME instruction;Gait training;Functional mobility training;Therapeutic activities;Therapeutic exercise;Balance training;Patient/family education    PT Goals (Current goals can be found in the Care Plan section)  Acute Rehab PT Goals Patient Stated Goal: to eat breakfast PT Goal Formulation: With patient Time For Goal Achievement: 06/08/17 Potential to Achieve Goals: Fair    Frequency Min 2X/week   Barriers to discharge Decreased caregiver support      Co-evaluation               AM-PAC PT "6 Clicks" Daily Activity  Outcome Measure Difficulty turning over in bed (including adjusting bedclothes, sheets and blankets)?: Unable Difficulty moving from lying on back to sitting on the side of the bed? : Unable Difficulty sitting down on and standing up from a chair with arms (e.g., wheelchair, bedside commode, etc,.)?: Unable Help needed moving to and from a bed to chair (including a wheelchair)?: A Lot Help needed walking in hospital room?: Total Help needed climbing 3-5 steps with a railing? : Total 6 Click Score: 7    End of Session Equipment Utilized During Treatment: Gait belt Activity Tolerance: Other (comment)(breakfast arrived in room; patient declined additional activity, wanted to eat) Patient left: with call bell/phone within reach;with chair alarm set;in chair Nurse Communication: Mobility status PT Visit Diagnosis: Unsteadiness on feet (R26.81);Difficulty in walking, not elsewhere classified  (R26.2);Muscle weakness (generalized) (M62.81)    Time: 0263-7858 PT Time Calculation (min) (ACUTE ONLY): 16 min   Charges:   PT Evaluation $PT Eval Low Complexity: 1 Low     PT G Codes:   PT G-Codes **NOT FOR INPATIENT CLASS** Functional Assessment Tool Used: AM-PAC 6 Clicks Basic Mobility Functional Limitation: Mobility: Walking and moving around Mobility: Walking and Moving Around Current Status (I5027): At least 60 percent but less than 80 percent impaired, limited or restricted Mobility: Walking and Moving Around Goal Status [phone]): At least 20 percent but less than 40 percent impaired, limited or restricted    Casi Westerfeld H. Owens Shark, PT, DPT, NCS 05/25/17, 10:48 AM [phone]

## 2017-05-25 NOTE — Care Management Obs Status (Signed)
MEDICARE OBSERVATION STATUS NOTIFICATION   Patient Details  Name: [name] MRN: [id number] Date of Birth: 05-10-1930   Medicare Observation Status Notification Given:  No(Hx dementia.  Voicemail left for daughter )    Beverly Sessions, RN 05/25/2017, 11:46 AM

## 2017-05-25 NOTE — Progress Notes (Signed)
[facility] [place] NAME: [name]    MR#:  [id number]  DATE OF BIRTH:  1929-08-17  SUBJECTIVE:  Woman with persistent nausea.  No vomiting anymore.  Denies any bloody vomitus or bloody stools.  Patient reports she wants to be discharged from the hospital  REVIEW OF SYSTEMS:   Review of Systems  Constitutional: Negative for chills, fever and weight loss.  HENT: Negative for ear discharge, ear pain and nosebleeds.   Eyes: Negative for blurred vision, pain and discharge.  Respiratory: Negative for sputum production, shortness of breath, wheezing and stridor.   Cardiovascular: Negative for chest pain, palpitations, orthopnea and PND.  Gastrointestinal: Positive for nausea. Negative for abdominal pain, diarrhea and vomiting.  Genitourinary: Negative for frequency and urgency.  Musculoskeletal: Negative for back pain and joint pain.  Neurological: Positive for weakness. Negative for sensory change, speech change and focal weakness.  Psychiatric/Behavioral: Negative for depression and hallucinations. The patient is not nervous/anxious.    Tolerating Diet:FLD Tolerating PT: pending  DRUG ALLERGIES:   Allergies  Allergen Reactions  . Codeine Swelling  . Sulfa Antibiotics Other (See Comments)    GI upset  . Biphosphate Other (See Comments)  . Morphine And Related Other (See Comments)  . Percocet [Oxycodone-Acetaminophen] Other (See Comments)  . Plavix [Clopidogrel Bisulfate] Itching and Rash    VITALS:  Blood pressure 136/61, pulse 76, temperature 98 F (36.7 C), temperature source Oral, resp. rate 18, height 5\' 6"  (1.676 m), weight 62.7 kg (138 lb 4.8 oz), SpO2 98 %.  PHYSICAL EXAMINATION:   Physical Exam  GENERAL:  82 y.o.-year-old patient lying in the bed with no acute distress.  EYES: Pupils equal, round, reactive to light and accommodation. No scleral icterus. Extraocular muscles intact.  HEENT: Head atraumatic,  normocephalic. Oropharynx and nasopharynx clear.  NECK:  Supple, no jugular venous distention. No thyroid enlargement, no tenderness.  LUNGS: Normal breath sounds bilaterally, no wheezing, rales, rhonchi. No use of accessory muscles of respiration.  CARDIOVASCULAR: S1, S2 normal. No murmurs, rubs, or gallops.  ABDOMEN: Soft, nontender, nondistended. Bowel sounds present. No organomegaly or mass.  EXTREMITIES: No cyanosis, clubbing or edema b/l.   Chronic contractures due to severe rheumatoid arthritis NEUROLOGIC: Cranial nerves II through XII are intact. No focal Motor or sensory deficits b/l.   PSYCHIATRIC:  patient is alert and oriented x 3.  SKIN: No obvious rash, lesion, or ulcer.   LABORATORY PANEL:  CBC Recent Labs  Lab 05/25/17 0531  WBC 7.2  HGB 11.4*  HCT 33.8*  PLT 171    Chemistries  Recent Labs  Lab 05/25/17 0531  NA 140  K 4.3  CL 107  CO2 26  GLUCOSE 146*  BUN 25*  CREATININE 1.89*  CALCIUM 9.4  AST 20  ALT 10*  ALKPHOS 63  BILITOT 0.7   Cardiac Enzymes No results for input(s): TROPONINI in the last 168 hours. RADIOLOGY:  US Abdomen Complete  Result Date: 05/24/2017 CLINICAL DATA:  Patient with nausea for 2 weeks. EXAM: ABDOMEN ULTRASOUND COMPLETE COMPARISON:  None. FINDINGS: Gallbladder: No gallstones or wall thickening visualized. No sonographic Murphy sign noted by sonographer. Common bile duct: Diameter: 3 mm Liver: No focal lesion identified. Within normal limits in parenchymal echogenicity. Portal vein is patent on color Doppler imaging with normal direction of blood flow towards the liver. IVC: No abnormality visualized. Pancreas: Visualized portion unremarkable. Spleen: Size and appearance within normal limits. Right Kidney: Length: 7.7 cm. Renal  cortical thinning. No hydronephrosis. Left Kidney: Length: 8.3 cm. Renal cortical thinning. No hydronephrosis. Abdominal aorta: Not well visualized due to overlying bowel gas. Other findings: None.  IMPRESSION: No acute process within the abdomen. No cholelithiasis or sonographic evidence for acute cholecystitis. No hydronephrosis. Electronically Signed   By: [name] M.D.   On: 05/24/2017 19:26   ASSESSMENT AND PLAN:  [name] is a 83 y.o. female has a past medical history significant for RA, HTN, CAD, and hx of CVA who presents to ER with >1 month hx of nausea and anorexia. She resides at a SNF and is not eating. Feeling weak. In ER, pt noted to be dehydrated with AKI.   1.  Acute renal failure secondary to poor p.o. Intake -IV fluids -His line creatinine 1.36-1.78. -With creatinine point of 2.08--- 1.89 -Avoid nephrotoxins -Monitor I's and O's  2.  Persistent nausea with history of peptic ulcer disease -GI consultation appreciated -Patient will  get EGD tomorrow -Hemoglobin stable -Continue PPI -Abdominal ultrasound negative  3.  History of rheumatoid arthritis -Is on chronic steroids -Significant contractures  4.  DVT prophylaxis subcu Lovenox  Case discussed with Care Management/Social Worker. Management plans discussed with the patient, family and they are in agreement.  CODE STATUS: Full DVT Prophylaxis: Lovenox  TOTAL TIME TAKING CARE OF THIS PATIENT: *30* minutes.  >50% time spent on counselling and coordination of care  POSSIBLE D/C IN 2 to DAYS, DEPENDING ON CLINICAL CONDITION.  Note: This dictation was prepared with Dragon dictation along with smaller phrase technology. Any transcriptional errors that result from this process are unintentional.  [name] M.D on 05/25/2017 at 2:56 PM  Between 7am to 6pm - Pager - [phone]  After 6pm go to [email or web address] - password [email or web address] [facility]  Office  [phone]  CC: Primary care physician; Patient, No Pcp Per

## 2017-05-26 ENCOUNTER — Observation Stay: Payer: Medicare Other | Admitting: Anesthesiology

## 2017-05-26 ENCOUNTER — Encounter: Payer: Self-pay | Admitting: Anesthesiology

## 2017-05-26 ENCOUNTER — Encounter
Admission: EM | Disposition: A | Discharge status: Skilled Nursing Facility | Payer: Self-pay | Source: Home / Self Care | Attending: Emergency Medicine

## 2017-05-26 DIAGNOSIS — M069 Rheumatoid arthritis, unspecified: Secondary | ICD-10-CM | POA: Diagnosis not present

## 2017-05-26 DIAGNOSIS — I739 Peripheral vascular disease, unspecified: Secondary | ICD-10-CM | POA: Diagnosis not present

## 2017-05-26 DIAGNOSIS — I509 Heart failure, unspecified: Secondary | ICD-10-CM | POA: Diagnosis not present

## 2017-05-26 DIAGNOSIS — R110 Nausea: Secondary | ICD-10-CM | POA: Diagnosis not present

## 2017-05-26 DIAGNOSIS — E860 Dehydration: Secondary | ICD-10-CM | POA: Diagnosis not present

## 2017-05-26 DIAGNOSIS — K2970 Gastritis, unspecified, without bleeding: Secondary | ICD-10-CM | POA: Diagnosis not present

## 2017-05-26 DIAGNOSIS — K219 Gastro-esophageal reflux disease without esophagitis: Secondary | ICD-10-CM | POA: Diagnosis not present

## 2017-05-26 DIAGNOSIS — R4182 Altered mental status, unspecified: Secondary | ICD-10-CM | POA: Diagnosis not present

## 2017-05-26 DIAGNOSIS — Z7401 Bed confinement status: Secondary | ICD-10-CM | POA: Diagnosis not present

## 2017-05-26 DIAGNOSIS — K449 Diaphragmatic hernia without obstruction or gangrene: Secondary | ICD-10-CM | POA: Diagnosis not present

## 2017-05-26 DIAGNOSIS — E039 Hypothyroidism, unspecified: Secondary | ICD-10-CM | POA: Diagnosis not present

## 2017-05-26 DIAGNOSIS — I252 Old myocardial infarction: Secondary | ICD-10-CM | POA: Diagnosis not present

## 2017-05-26 DIAGNOSIS — I110 Hypertensive heart disease with heart failure: Secondary | ICD-10-CM | POA: Diagnosis not present

## 2017-05-26 DIAGNOSIS — N179 Acute kidney failure, unspecified: Secondary | ICD-10-CM | POA: Diagnosis not present

## 2017-05-26 HISTORY — PX: ESOPHAGOGASTRODUODENOSCOPY (EGD) WITH PROPOFOL: SHX5813

## 2017-05-26 SURGERY — ESOPHAGOGASTRODUODENOSCOPY (EGD) WITH PROPOFOL
Anesthesia: General

## 2017-05-26 MED ORDER — LIDOCAINE HCL (PF) 2 % IJ SOLN
INTRAMUSCULAR | Status: AC
  Filled 2017-05-26: qty 10

## 2017-05-26 MED ORDER — FUROSEMIDE 40 MG PO TABS
40.0000 mg | ORAL_TABLET | Freq: Every day | ORAL | Status: DC
  Administered 2017-05-26: 40 mg via ORAL
  Filled 2017-05-26: qty 1

## 2017-05-26 MED ORDER — PROPOFOL 500 MG/50ML IV EMUL
INTRAVENOUS | Status: AC
  Filled 2017-05-26: qty 50

## 2017-05-26 MED ORDER — FUROSEMIDE 8 MG/ML PO SOLN: 40.0000 mg | Freq: Every day | ORAL | Status: DC

## 2017-05-26 MED ORDER — PROPOFOL 10 MG/ML IV BOLUS
INTRAVENOUS | Status: DC | PRN
  Administered 2017-05-26: 120 mg via INTRAVENOUS

## 2017-05-26 MED ORDER — LIDOCAINE HCL (CARDIAC) 20 MG/ML IV SOLN
INTRAVENOUS | Status: DC | PRN
  Administered 2017-05-26: 50 mg via INTRAVENOUS

## 2017-05-26 NOTE — Anesthesia Postprocedure Evaluation (Signed)
Anesthesia Post Note  Patient: [name]  Procedure(s) Performed: ESOPHAGOGASTRODUODENOSCOPY (EGD) WITH PROPOFOL (N/A )  Patient location during evaluation: PACU Anesthesia Type: General Level of consciousness: awake Pain management: pain level controlled Vital Signs Assessment: post-procedure vital signs reviewed and stable Respiratory status: spontaneous breathing Cardiovascular status: stable Anesthetic complications: no     Last Vitals:  Vitals:   05/26/17 1204 05/26/17 1231  BP: (!) 129/57 129/60  Pulse: 72 73  Resp: 19 17  Temp:  36.7 C  SpO2: 95% 97%    Last Pain:  Vitals:   05/26/17 1231  TempSrc: Oral  PainSc:                  VAN STAVEREN,Omri Bertran

## 2017-05-26 NOTE — Discharge Summary (Signed)
[facility] NAME: Serai Tukes    MR#:  [id number]  DATE OF BIRTH:  [birth year]  DATE OF ADMISSION:  05/24/2017 ADMITTING PHYSICIAN: Idelle Crouch, MD  DATE OF DISCHARGE: 05/26/2017  PRIMARY CARE PHYSICIAN: Patient, No Pcp Per    ADMISSION DIAGNOSIS:  Nausea [R11.0] Acute on chronic renal insufficiency [N28.9, N18.9]  DISCHARGE DIAGNOSIS:  Nausea and vomiting due to Gastritis Acute on CKD-III   SECONDARY DIAGNOSIS:   Past Medical History:  Diagnosis Date  . Anxiety   . Arthritis   . Chronic diastolic CHF (congestive heart failure) ([place])    a. 10/2014 Echo: EF 60-65%, mild LVH, grade 1 DD, dynamic obstruction @ rest - peak velocity of 271 cm/sec, peak grad of 61mmHg, PASP 66mmHg.  Marland Kitchen Coronary artery disease    a. 2012 s/p NSTEMI/Cath: 95% apical LAD dzs, otw nonobs dzs-->Med Rx;  b. 05/2011 MV: no ischemia, EF 79%, inf infarct- attenuation.  . CVA (cerebral vascular accident) ([place])   . Depression   . DVT of lower extremity (deep venous thrombosis) ([place])   . Dyspnea   . Elevated troponin    a. 10/2014 in setting of sepsis/?HCAP.  Marland Kitchen Enteritis due to Clostridium difficile 01/04/2013  . Essential hypertension   . Fall   . Fatigue   . GERD (gastroesophageal reflux disease)   . GI bleed 2005  . HCAP (healthcare-associated pneumonia) 01/22/2013  . Hemorrhoids   . Hiatal hernia   . Hyperlipidemia   . Hypothyroidism   . Malnutrition ([place])   . Obstructive hypertrophic cardiomyopathy ([place])    a. 10/2014 Echo: EF 60-65%, mild LVH, grade 1 DD, dynamic obstruction @ rest - peak velocity of 271 cm/sec, peak grad of 37mmHg, PASP 42mmHg.  . Osteoporosis   . PUD (peptic ulcer disease)   . RA (rheumatoid arthritis) ([place])   . Recurrent colitis due to Clostridium difficile 06/02/2012  . Renal insufficiency   . Right ear pain   . Sepsis ([place]) 12/19/2012   UTIs  . TIA (transient ischemic attack)   . Weight loss      HOSPITAL COURSE:  Sherly Leggettis a 83 y.o.femalehas a past medical history significant forRA, HTN, CAD, and hx of CVA who presents to ER with >1 month hx of nausea and anorexia. She resides at a SNF and is not eating. Feeling weak. In ER, pt noted to be dehydrated with AKI.   1.  Acute renal failure secondary to poor p.o. Intake -received IV fluids -His line creatinine 1.36-1.78. -With creatinine point of 2.08--- 1.89 -Avoid nephrotoxins -Monitor I's and O's -family request Nephrology consult as out pt  2.  Persistent nausea with history of peptic ulcer disease -GI consultation appreciated -EGD shows HH and gastritis -Hemoglobin stable -Continue PPI -Abdominal ultrasound negative  3.  History of rheumatoid arthritis -Is on chronic steroids -Significant contractures  4.  DVT prophylaxis subcu Lovenox   Overall improving D/c back to her Long term facility CONSULTS OBTAINED:  Treatment Team:  Lucilla Lame, MD  DRUG ALLERGIES:   Allergies  Allergen Reactions  . Codeine Swelling  . Sulfa Antibiotics Other (See Comments)    GI upset  . Biphosphate Other (See Comments)  . Morphine And Related Other (See Comments)  . Percocet [Oxycodone-Acetaminophen] Other (See Comments)  . Plavix [Clopidogrel Bisulfate] Itching and Rash    DISCHARGE MEDICATIONS:   Current Discharge Medication List    CONTINUE these medications which have NOT CHANGED  Details  apixaban (ELIQUIS) 2.5 MG TABS tablet Take 1 tablet (2.5 mg total) by mouth 2 (two) times daily. Qty: 60 tablet, Refills: 0    Biotin 300 MCG TABS Take 300 mcg by mouth daily.    carboxymethylcellulose 1 % ophthalmic solution Apply 1 drop to eye 3 (three) times daily. Both eyes    cetirizine (ZYRTEC) 10 MG tablet Take 10 mg by mouth daily.     Cholecalciferol (VITAMIN D) 2000 UNITS CAPS Take 1 capsule (2,000 Units total) by mouth daily. Qty: 30 capsule, Refills: 11   Associated Diagnoses: RA (rheumatoid  arthritis) (HCC)    Cranberry 200 MG CAPS Take 2 capsules by mouth 2 (two) times daily.     denosumab (PROLIA) 60 MG/ML SOLN injection Inject 60 mg into the skin every 6 (six) months. Administer in upper arm, thigh, or abdomen    esomeprazole (NEXIUM) 40 MG capsule Take 40 mg by mouth 2 (two) times daily before a meal.     ferrous sulfate 325 (65 FE) MG tablet Take 325 mg by mouth daily.     furosemide (LASIX) 40 MG tablet Take 1 tablet (40 mg total) by mouth daily. Qty: 30 tablet, Refills: 0    HYDROcodone-acetaminophen (NORCO/VICODIN) 5-325 MG tablet Take 1 tablet by mouth daily. Qty: 10 tablet, Refills: 0    ipratropium (ATROVENT) 0.03 % nasal spray Place 2 sprays into both nostrils daily as needed (For asthma).     ipratropium-albuterol (DUONEB) 0.5-2.5 (3) MG/3ML SOLN Take 3 mLs by nebulization every 8 (eight) hours as needed (wheezing).     levothyroxine (SYNTHROID, LEVOTHROID) 25 MCG tablet Take 1 tablet (25 mcg total) by mouth daily before breakfast.    lidocaine (LIDODERM) 5 % Place 1 patch onto the skin daily. Remove & Discard patch within 12 hours or as directed by MD Qty: 30 patch, Refills: 0    LORazepam (ATIVAN) 0.5 MG tablet Take one tablet by mouth every night at bedtime for anxiety Qty: 10 tablet, Refills: 0    meclizine (ANTIVERT) 12.5 MG tablet Take 12.5 mg by mouth 2 (two) times daily.     Menthol, Topical Analgesic, (BIOFREEZE) 4 % GEL Apply 1 application topically 2 (two) times daily. For wrist pain     metoprolol (LOPRESSOR) 50 MG tablet Take 50 mg by mouth 2 (two) times daily.     mirtazapine (REMERON) 15 MG tablet Take 15 mg by mouth at bedtime.    montelukast (SINGULAIR) 10 MG tablet Take 10 mg by mouth at bedtime.     Multiple Vitamins-Minerals (CERTAVITE/ANTIOXIDANTS PO) Take 1 tablet by mouth daily.     nitrofurantoin, macrocrystal-monohydrate, (MACROBID) 100 MG capsule Take 100 mg by mouth daily.     ondansetron (ZOFRAN) 4 MG tablet Take 4 mg by  mouth every 6 (six) hours as needed for nausea or vomiting.    OXYGEN Inhale 2 L into the lungs as needed (To maintain saturations >90%).     PATADAY 0.2 % SOLN Place 1 drop into both eyes daily.     potassium chloride SA (K-DUR,KLOR-CON) 20 MEQ tablet Take 1 tablet (20 mEq total) by mouth daily. Qty: 30 tablet, Refills: 0    predniSONE (DELTASONE) 10 MG tablet Take 10 mg by mouth daily. For RA    rosuvastatin (CRESTOR) 20 MG tablet Take 20 mg by mouth every evening. For hyperlipidemia    sertraline (ZOLOFT) 50 MG tablet Take 50 mg by mouth every morning. For depression    nitroGLYCERIN (NITROSTAT) 0.4 MG  SL tablet Place 0.4 mg under the tongue every 5 (five) minutes as needed for chest pain.    ondansetron (ZOFRAN ODT) 4 MG disintegrating tablet Take 1 tablet (4 mg total) by mouth every 8 (eight) hours as needed for nausea or vomiting. Qty: 12 tablet, Refills: 0        If you experience worsening of your admission symptoms, develop shortness of breath, life threatening emergency, suicidal or homicidal thoughts you must seek medical attention immediately by calling 911 or calling your MD immediately  if symptoms less severe.  You Must read complete instructions/literature along with all the possible adverse reactions/side effects for all the Medicines you take and that have been prescribed to you. Take any new Medicines after you have completely understood and accept all the possible adverse reactions/side effects.   Please note  You were cared for by a hospitalist during your hospital stay. If you have any questions about your discharge medications or the care you received while you were in the hospital after you are discharged, you can call the unit and asked to speak with the hospitalist on call if the hospitalist that took care of you is not available. Once you are discharged, your primary care physician will handle any further medical issues. Please note that NO REFILLS for any  discharge medications will be authorized once you are discharged, as it is imperative that you return to your primary care physician (or establish a relationship with a primary care physician if you do not have one) for your aftercare needs so that they can reassess your need for medications and monitor your lab values. Today   SUBJECTIVE   No new complaints  VITAL SIGNS:  Blood pressure 129/60, pulse 73, temperature 98 F (36.7 C), temperature source Oral, resp. rate 17, height 5\' 6"  (1.676 m), weight 63.3 kg (139 lb 9.6 oz), SpO2 97 %.  I/O:    Intake/Output Summary (Last 24 hours) at 05/26/2017 1320 Last data filed at 05/26/2017 1131 Gross per 24 hour  Intake 1568 ml  Output -  Net 1568 ml    PHYSICAL EXAMINATION:  GENERAL:  83 y.o.-year-old patient lying in the bed with no acute distress.  EYES: Pupils equal, round, reactive to light and accommodation. No scleral icterus. Extraocular muscles intact.  HEENT: Head atraumatic, normocephalic. Oropharynx and nasopharynx clear.  NECK:  Supple, no jugular venous distention. No thyroid enlargement, no tenderness.  LUNGS: Normal breath sounds bilaterally, no wheezing, rales,rhonchi or crepitation. No use of accessory muscles of respiration.  CARDIOVASCULAR: S1, S2 normal. No murmurs, rubs, or gallops.  ABDOMEN: Soft, non-tender, non-distended. Bowel sounds present. No organomegaly or mass.  EXTREMITIES: No pedal edema, cyanosis, or clubbing. Chronic arthritis changes NEUROLOGIC: Cranial nerves II through XII are intact. Muscle strength 5/5 in all extremities. Sensation intact. Gait not checked.  PSYCHIATRIC: The patient is alert and oriented x 3.  SKIN: No obvious rash, lesion, or ulcer.   DATA REVIEW:   CBC  Recent Labs  Lab 05/25/17 0531  WBC 7.2  HGB 11.4*  HCT 33.8*  PLT 171    Chemistries  Recent Labs  Lab 05/25/17 0531  NA 140  K 4.3  CL 107  CO2 26  GLUCOSE 146*  BUN 25*  CREATININE 1.89*  CALCIUM 9.4  AST  20  ALT 10*  ALKPHOS 63  BILITOT 0.7    Microbiology Results   Recent Results (from the past 240 hour(s))  MRSA PCR Screening     Status:  None   Collection Time: 05/24/17  5:19 PM  Result Value Ref Range Status   MRSA by PCR NEGATIVE NEGATIVE Final    Comment:        The GeneXpert MRSA Assay (FDA approved for NASAL specimens only), is one component of a comprehensive MRSA colonization surveillance program. It is not intended to diagnose MRSA infection nor to guide or monitor treatment for MRSA infections.     RADIOLOGY:  US Abdomen Complete  Result Date: 05/24/2017 CLINICAL DATA:  Patient with nausea for 2 weeks. EXAM: ABDOMEN ULTRASOUND COMPLETE COMPARISON:  None. FINDINGS: Gallbladder: No gallstones or wall thickening visualized. No sonographic Murphy sign noted by sonographer. Common bile duct: Diameter: 3 mm Liver: No focal lesion identified. Within normal limits in parenchymal echogenicity. Portal vein is patent on color Doppler imaging with normal direction of blood flow towards the liver. IVC: No abnormality visualized. Pancreas: Visualized portion unremarkable. Spleen: Size and appearance within normal limits. Right Kidney: Length: 7.7 cm. Renal cortical thinning. No hydronephrosis. Left Kidney: Length: 8.3 cm. Renal cortical thinning. No hydronephrosis. Abdominal aorta: Not well visualized due to overlying bowel gas. Other findings: None. IMPRESSION: No acute process within the abdomen. No cholelithiasis or sonographic evidence for acute cholecystitis. No hydronephrosis. Electronically Signed   By: Lovey Newcomer M.D.   On: 05/24/2017 19:26     Management plans discussed with the patient, family and they are in agreement.  CODE STATUS:     Code Status Orders  (From admission, onward)        Start     Ordered   05/24/17 1659  Full code  Continuous     05/24/17 1658    Code Status History    Date Active Date Inactive Code Status Order ID Comments User Context    11/13/2016 23:11 11/19/2016 18:59 Full Code [id number]  Karmen Bongo, MD Inpatient   09/15/2016 21:55 09/18/2016 17:32 Full Code [id number]  Rise Patience, MD Inpatient   07/22/2015 17:52 07/26/2015 19:50 Full Code [id number]  Samella Parr, NP Inpatient   10/29/2014 14:59 11/06/2014 18:41 Full Code [id number]  Robbie Lis, MD Inpatient   01/22/2013 22:56 01/25/2013 18:40 Full Code [id number]  Etta Quill., DO ED   07/06/2012 23:38 07/08/2012 20:15 Full Code [id number]  Morrison Old, RN Inpatient   06/01/2012 18:51 06/04/2012 19:24 Full Code [id number]  Wilfred Curtis, RN Inpatient   03/31/2012 19:30 04/05/2012 16:31 Full Code [id number]  Ofilia Neas, RN ED   03/16/2012 17:43 03/19/2012 18:14 Full Code [id number]  Tanda Rockers, MD ED   12/01/2011 20:24 12/09/2011 19:11 Full Code [id number]  Ernestine Conrad, RN Inpatient   05/11/2011 03:12 05/12/2011 23:17 Full Code [id number]  Duffy, Terese Door, RN Inpatient    Advance Directive Documentation     Most Recent Value  Type of Advance Directive  Healthcare Power of Attorney, Living will  Pre-existing out of facility DNR order (yellow form or pink MOST form)  No data  "MOST" Form in Place?  No data      TOTAL TIME TAKING CARE OF THIS PATIENT: 40 minutes.    Fritzi Mandes M.D on 05/26/2017 at 1:20 PM  Between 7am to 6pm - Pager - [phone] After 6pm go to [email or web address] - password [email or web address] [facility]  Office  [phone]  CC: Primary care physician; Patient, No Pcp Per

## 2017-05-26 NOTE — Anesthesia Post-op Follow-up Note (Signed)
Anesthesia QCDR form completed.        

## 2017-05-26 NOTE — Anesthesia Preprocedure Evaluation (Signed)
Anesthesia Evaluation  Patient identified by MRN, date of birth, ID band Patient awake    Reviewed: Allergy & Precautions, NPO status , Patient's Chart, lab work & pertinent test results  Airway Mallampati: III  TM Distance: <3 FB     Dental  (+) Missing, Partial Upper   Pulmonary shortness of breath and with exertion, pneumonia, resolved,     + decreased breath sounds      Cardiovascular hypertension, Pt. on medications + CAD, + Past MI, + Peripheral Vascular Disease and +CHF  Normal cardiovascular exam     Neuro/Psych PSYCHIATRIC DISORDERS Anxiety Depression TIACVA    GI/Hepatic hiatal hernia, PUD, GERD  Medicated,  Endo/Other  Hypothyroidism   Renal/GU Renal disease  negative genitourinary   Musculoskeletal  (+) Arthritis , Rheumatoid disorders,    Abdominal Normal abdominal exam  (+)   Peds negative pediatric ROS (+)  Hematology  (+) anemia ,   Anesthesia Other Findings Past Medical History: No date: Anxiety No date: Arthritis No date: Chronic diastolic CHF (congestive heart failure) ([place])     Comment:  a. 10/2014 Echo: EF 60-65%, mild LVH, grade 1 DD, dynamic              obstruction @ rest - peak velocity of 271 cm/sec, peak               grad of 20mmHg, PASP 66mmHg. No date: Coronary artery disease     Comment:  a. 2012 s/p NSTEMI/Cath: 95% apical LAD dzs, otw nonobs               dzs-->Med Rx;  b. 05/2011 MV: no ischemia, EF 79%, inf               infarct- attenuation. No date: CVA (cerebral vascular accident) ([place]) No date: Depression No date: DVT of lower extremity (deep venous thrombosis) ([facility]) No date: Dyspnea No date: Elevated troponin     Comment:  a. 10/2014 in setting of sepsis/?HCAP. 01/04/2013: Enteritis due to Clostridium difficile No date: Essential hypertension No date: Fall No date: Fatigue No date: GERD (gastroesophageal reflux disease) 2005: GI bleed 01/22/2013: HCAP  (healthcare-associated pneumonia) No date: Hemorrhoids No date: Hiatal hernia No date: Hyperlipidemia No date: Hypothyroidism No date: Malnutrition ([place]) No date: Obstructive hypertrophic cardiomyopathy ([place])     Comment:  a. 10/2014 Echo: EF 60-65%, mild LVH, grade 1 DD, dynamic              obstruction @ rest - peak velocity of 271 cm/sec, peak               grad of 31mmHg, PASP 49mmHg. No date: Osteoporosis No date: PUD (peptic ulcer disease) No date: RA (rheumatoid arthritis) ([place]) 06/02/2012: Recurrent colitis due to Clostridium difficile No date: Renal insufficiency No date: Right ear pain 12/19/2012: Sepsis ([place])     Comment:  UTIs No date: TIA (transient ischemic attack) No date: Weight loss  Reproductive/Obstetrics                             Anesthesia Physical Anesthesia Plan  ASA: IV  Anesthesia Plan: General   Post-op Pain Management:    Induction: Intravenous  PONV Risk Score and Plan:   Airway Management Planned: Nasal Cannula  Additional Equipment:   Intra-op Plan:   Post-operative Plan:   Informed Consent: I have reviewed the patients History and Physical, chart, labs and discussed the procedure including the risks,  benefits and alternatives for the proposed anesthesia with the patient or authorized representative who has indicated his/her understanding and acceptance.   Dental advisory given  Plan Discussed with: CRNA and Surgeon  Anesthesia Plan Comments:         Anesthesia Quick Evaluation

## 2017-05-26 NOTE — Care Management Obs Status (Signed)
[facility] NOTIFICATION   Patient Details  Name: Chassidy Layson MRN: [id number] Date of Birth: 11-Jan-1930   Medicare Observation Status Notification Given:  Yes(Telephone reviewed with daughter )    [place]ly Sessions, RN 05/26/2017, 10:49 AM

## 2017-05-26 NOTE — Transfer of Care (Signed)
Immediate Anesthesia Transfer of Care Note  Patient: [name]  Procedure(s) Performed: ESOPHAGOGASTRODUODENOSCOPY (EGD) WITH PROPOFOL (N/A )  Patient Location: PACU  Anesthesia Type:MAC  Level of Consciousness: sedated  Airway & Oxygen Therapy: Patient Spontanous Breathing and Patient connected to nasal cannula oxygen  Post-op Assessment: Report given to RN and Post -op Vital signs reviewed and stable  Post vital signs: Reviewed and stable  Last Vitals:  Vitals:   05/26/17 1021 05/26/17 1134  BP: (!) 169/65 (!) 89/42  Pulse:  72  Resp: 16 18  Temp: 36.6 C (!) 36.1 C  SpO2: 97% 99%    Last Pain:  Vitals:   05/26/17 1134  TempSrc: Tympanic  PainSc:          Complications: No apparent anesthesia complications

## 2017-05-26 NOTE — Op Note (Addendum)
[facility]astroenterology Patient Name: [name] Procedure Date: 05/26/2017 11:22 AM MRN: [id number] Account #: [id number] Date of Birth: 11-Jan-1930 Admit Type: Inpatient Age: 83 Room: [facility] ENDO ROOM 4 Gender: Female Note Status: Finalized Procedure:            Upper GI endoscopy Indications:          Nausea with vomiting Providers:            Lucilla Lame MD, MD Referring MD:         No Local Md, MD (Referring MD) Medicines:            Propofol per Anesthesia Complications:        No immediate complications. Procedure:            Pre-Anesthesia Assessment:                       - Prior to the procedure, a History and Physical was                        performed, and patient medications and allergies were                        reviewed. The patient's tolerance of previous                        anesthesia was also reviewed. The risks and benefits of                        the procedure and the sedation options and risks were                        discussed with the patient. All questions were                        answered, and informed consent was obtained. Prior                        Anticoagulants: The patient has taken anticoagulant                        medication, last dose was 1 day prior to procedure. ASA                        Grade Assessment: II - A patient with mild systemic                        disease. After reviewing the risks and benefits, the                        patient was deemed in satisfactory condition to undergo                        the procedure.                       After obtaining informed consent, the endoscope was                        passed under direct vision. Throughout the procedure,  the patient's blood pressure, pulse, and oxygen                        saturations were monitored continuously. The                        Colonoscope was introduced through the mouth, and        advanced to the second part of duodenum. The upper GI                        endoscopy was accomplished without difficulty. The                        patient tolerated the procedure well. Findings:      A large hiatal hernia was present.      Localized minimal inflammation characterized by erythema was found in       the gastric antrum.      The examined duodenum was normal. Impression:           - Large hiatal hernia.                       - Gastritis.                       - Normal examined duodenum.                       - No specimens collected. Recommendation:       - Return patient to hospital ward for ongoing care.                       - Advance diet as tolerated. Procedure Code(s):    --- Professional ---                       [phone], Esophagogastroduodenoscopy, flexible, transoral;                        diagnostic, including collection of specimen(s) by                        brushing or washing, when performed (separate procedure) Diagnosis Code(s):    --- Professional ---                       R11.2, Nausea with vomiting, unspecified                       K29.70, Gastritis, unspecified, without bleeding CPT copyright 2016 American Medical Association. All rights reserved. The codes documented in this report are preliminary and upon coder review may  be revised to meet current compliance requirements. Lucilla Lame MD, MD 05/26/2017 11:32:13 AM This report has been signed electronically. Number of Addenda: 0 Note Initiated On: 05/26/2017 11:22 AM      [facility]

## 2017-05-26 NOTE — Clinical Social Work Note (Signed)
Clinical Social Work Assessment  Patient Details  Name: [name] MRN: [id number] Date of Birth: Dec 17, 1929  Date of referral:  05/26/17               Reason for consult:  Discharge Planning                Permission sought to share information with:    Permission granted to share information::     Name::        Agency::     Relationship::     Contact Information:     Housing/Transportation Living arrangements for the past 2 months:  [place] of Information:  Facility, Adult Children Patient Interpreter Needed:  None Criminal Activity/Legal Involvement Pertinent to Current Situation/Hospitalization:  No - Comment as needed Significant Relationships:  Adult Children Lives with:  Facility Resident Do you feel safe going back to the place where you live?  Yes Need for family participation in patient care:  Yes (Comment)  Care giving concerns:  Patient resides at [facility].    Social Worker assessment / plan:  MD to discharge patient today and Tracie at [facility] is aware and can take patient back. Discharge information sent. CSW spoke with patient's daughter and she is aware of discharge and requests discharge via EMS.  Employment status:  Retired Forensic scientist:  Medicare PT Recommendations:  [place] / Referral to community resources:     Patient/Family's Response to care:  Patient's daughter expressed appreciation for CSW assistance.  Patient/Family's Understanding of and Emotional Response to Diagnosis, Current Treatment, and Prognosis: Patient is pleasantly confused today and daughter is aware of discharge.  Emotional Assessment Appearance:  Appears stated age Attitude/Demeanor/Rapport:  (pleasant and confused) Affect (typically observed):    Orientation:  Oriented to Self, Oriented to Place Alcohol / Substance use:  Not Applicable Psych involvement (Current and /or in the community):  No  (Comment)  Discharge Needs  Concerns to be addressed:  Care Coordination Readmission within the last 30 days:  No Current discharge risk:  None Barriers to Discharge:  No Barriers Identified   Shela Leff, LCSW 05/26/2017, 1:45 PM

## 2017-05-26 NOTE — Discharge Instructions (Signed)
F/u with PCP in 1-2 weeks.

## 2017-05-27 ENCOUNTER — Encounter: Payer: Self-pay | Admitting: Gastroenterology

## 2017-05-28 DIAGNOSIS — M6281 Muscle weakness (generalized): Secondary | ICD-10-CM | POA: Diagnosis not present

## 2017-05-28 DIAGNOSIS — E039 Hypothyroidism, unspecified: Secondary | ICD-10-CM | POA: Diagnosis not present

## 2017-05-28 DIAGNOSIS — K2970 Gastritis, unspecified, without bleeding: Secondary | ICD-10-CM | POA: Diagnosis not present

## 2017-05-28 DIAGNOSIS — N179 Acute kidney failure, unspecified: Secondary | ICD-10-CM | POA: Diagnosis not present

## 2017-05-28 DIAGNOSIS — M069 Rheumatoid arthritis, unspecified: Secondary | ICD-10-CM | POA: Diagnosis not present

## 2017-05-28 DIAGNOSIS — R2689 Other abnormalities of gait and mobility: Secondary | ICD-10-CM | POA: Diagnosis not present

## 2017-05-29 DIAGNOSIS — R2689 Other abnormalities of gait and mobility: Secondary | ICD-10-CM | POA: Diagnosis not present

## 2017-05-29 DIAGNOSIS — M6281 Muscle weakness (generalized): Secondary | ICD-10-CM | POA: Diagnosis not present

## 2017-06-01 DIAGNOSIS — I70235 Atherosclerosis of native arteries of right leg with ulceration of other part of foot: Secondary | ICD-10-CM | POA: Diagnosis not present

## 2017-06-01 DIAGNOSIS — M6281 Muscle weakness (generalized): Secondary | ICD-10-CM | POA: Diagnosis not present

## 2017-06-01 DIAGNOSIS — R2689 Other abnormalities of gait and mobility: Secondary | ICD-10-CM | POA: Diagnosis not present

## 2017-06-02 DIAGNOSIS — R2689 Other abnormalities of gait and mobility: Secondary | ICD-10-CM | POA: Diagnosis not present

## 2017-06-02 DIAGNOSIS — M6281 Muscle weakness (generalized): Secondary | ICD-10-CM | POA: Diagnosis not present

## 2017-06-03 DIAGNOSIS — R2689 Other abnormalities of gait and mobility: Secondary | ICD-10-CM | POA: Diagnosis not present

## 2017-06-03 DIAGNOSIS — M6281 Muscle weakness (generalized): Secondary | ICD-10-CM | POA: Diagnosis not present

## 2017-06-04 DIAGNOSIS — M6281 Muscle weakness (generalized): Secondary | ICD-10-CM | POA: Diagnosis not present

## 2017-06-04 DIAGNOSIS — R2689 Other abnormalities of gait and mobility: Secondary | ICD-10-CM | POA: Diagnosis not present

## 2017-06-04 DIAGNOSIS — K2970 Gastritis, unspecified, without bleeding: Secondary | ICD-10-CM | POA: Diagnosis not present

## 2017-06-04 DIAGNOSIS — D509 Iron deficiency anemia, unspecified: Secondary | ICD-10-CM | POA: Diagnosis not present

## 2017-06-04 DIAGNOSIS — M069 Rheumatoid arthritis, unspecified: Secondary | ICD-10-CM | POA: Diagnosis not present

## 2017-06-04 DIAGNOSIS — E039 Hypothyroidism, unspecified: Secondary | ICD-10-CM | POA: Diagnosis not present

## 2017-06-04 DIAGNOSIS — N390 Urinary tract infection, site not specified: Secondary | ICD-10-CM | POA: Diagnosis not present

## 2017-06-05 DIAGNOSIS — R2689 Other abnormalities of gait and mobility: Secondary | ICD-10-CM | POA: Diagnosis not present

## 2017-06-05 DIAGNOSIS — M6281 Muscle weakness (generalized): Secondary | ICD-10-CM | POA: Diagnosis not present

## 2017-06-06 DIAGNOSIS — M6281 Muscle weakness (generalized): Secondary | ICD-10-CM | POA: Diagnosis not present

## 2017-06-06 DIAGNOSIS — R2689 Other abnormalities of gait and mobility: Secondary | ICD-10-CM | POA: Diagnosis not present

## 2017-06-08 DIAGNOSIS — R2689 Other abnormalities of gait and mobility: Secondary | ICD-10-CM | POA: Diagnosis not present

## 2017-06-08 DIAGNOSIS — M6281 Muscle weakness (generalized): Secondary | ICD-10-CM | POA: Diagnosis not present

## 2017-06-09 DIAGNOSIS — R2689 Other abnormalities of gait and mobility: Secondary | ICD-10-CM | POA: Diagnosis not present

## 2017-06-09 DIAGNOSIS — M6281 Muscle weakness (generalized): Secondary | ICD-10-CM | POA: Diagnosis not present

## 2017-06-10 DIAGNOSIS — R2689 Other abnormalities of gait and mobility: Secondary | ICD-10-CM | POA: Diagnosis not present

## 2017-06-10 DIAGNOSIS — M6281 Muscle weakness (generalized): Secondary | ICD-10-CM | POA: Diagnosis not present

## 2017-06-11 DIAGNOSIS — M6281 Muscle weakness (generalized): Secondary | ICD-10-CM | POA: Diagnosis not present

## 2017-06-11 DIAGNOSIS — R2689 Other abnormalities of gait and mobility: Secondary | ICD-10-CM | POA: Diagnosis not present

## 2017-06-12 DIAGNOSIS — R2689 Other abnormalities of gait and mobility: Secondary | ICD-10-CM | POA: Diagnosis not present

## 2017-06-12 DIAGNOSIS — M6281 Muscle weakness (generalized): Secondary | ICD-10-CM | POA: Diagnosis not present

## 2017-06-15 DIAGNOSIS — R2689 Other abnormalities of gait and mobility: Secondary | ICD-10-CM | POA: Diagnosis not present

## 2017-06-15 DIAGNOSIS — M6281 Muscle weakness (generalized): Secondary | ICD-10-CM | POA: Diagnosis not present

## 2017-06-16 DIAGNOSIS — M6281 Muscle weakness (generalized): Secondary | ICD-10-CM | POA: Diagnosis not present

## 2017-06-16 DIAGNOSIS — R05 Cough: Secondary | ICD-10-CM | POA: Diagnosis not present

## 2017-06-16 DIAGNOSIS — R2689 Other abnormalities of gait and mobility: Secondary | ICD-10-CM | POA: Diagnosis not present

## 2017-06-16 DIAGNOSIS — R509 Fever, unspecified: Secondary | ICD-10-CM | POA: Diagnosis not present

## 2017-06-16 DIAGNOSIS — R070 Pain in throat: Secondary | ICD-10-CM | POA: Diagnosis not present

## 2017-06-17 DIAGNOSIS — M6281 Muscle weakness (generalized): Secondary | ICD-10-CM | POA: Diagnosis not present

## 2017-06-17 DIAGNOSIS — R2689 Other abnormalities of gait and mobility: Secondary | ICD-10-CM | POA: Diagnosis not present

## 2017-06-17 DIAGNOSIS — N390 Urinary tract infection, site not specified: Secondary | ICD-10-CM | POA: Diagnosis not present

## 2017-06-18 DIAGNOSIS — M6281 Muscle weakness (generalized): Secondary | ICD-10-CM | POA: Diagnosis not present

## 2017-06-18 DIAGNOSIS — R2689 Other abnormalities of gait and mobility: Secondary | ICD-10-CM | POA: Diagnosis not present

## 2017-06-19 DIAGNOSIS — M6281 Muscle weakness (generalized): Secondary | ICD-10-CM | POA: Diagnosis not present

## 2017-06-19 DIAGNOSIS — R2689 Other abnormalities of gait and mobility: Secondary | ICD-10-CM | POA: Diagnosis not present

## 2017-06-20 DIAGNOSIS — M6281 Muscle weakness (generalized): Secondary | ICD-10-CM | POA: Diagnosis not present

## 2017-06-20 DIAGNOSIS — R2689 Other abnormalities of gait and mobility: Secondary | ICD-10-CM | POA: Diagnosis not present

## 2017-06-21 DIAGNOSIS — R2689 Other abnormalities of gait and mobility: Secondary | ICD-10-CM | POA: Diagnosis not present

## 2017-06-21 DIAGNOSIS — M6281 Muscle weakness (generalized): Secondary | ICD-10-CM | POA: Diagnosis not present

## 2017-06-22 DIAGNOSIS — R2689 Other abnormalities of gait and mobility: Secondary | ICD-10-CM | POA: Diagnosis not present

## 2017-06-22 DIAGNOSIS — M6281 Muscle weakness (generalized): Secondary | ICD-10-CM | POA: Diagnosis not present

## 2017-06-24 DIAGNOSIS — R2689 Other abnormalities of gait and mobility: Secondary | ICD-10-CM | POA: Diagnosis not present

## 2017-06-24 DIAGNOSIS — M6281 Muscle weakness (generalized): Secondary | ICD-10-CM | POA: Diagnosis not present

## 2017-06-25 DIAGNOSIS — M6281 Muscle weakness (generalized): Secondary | ICD-10-CM | POA: Diagnosis not present

## 2017-06-25 DIAGNOSIS — R2689 Other abnormalities of gait and mobility: Secondary | ICD-10-CM | POA: Diagnosis not present

## 2017-06-26 DIAGNOSIS — M6281 Muscle weakness (generalized): Secondary | ICD-10-CM | POA: Diagnosis not present

## 2017-06-26 DIAGNOSIS — R2689 Other abnormalities of gait and mobility: Secondary | ICD-10-CM | POA: Diagnosis not present

## 2017-06-29 DIAGNOSIS — R2689 Other abnormalities of gait and mobility: Secondary | ICD-10-CM | POA: Diagnosis not present

## 2017-06-29 DIAGNOSIS — M6281 Muscle weakness (generalized): Secondary | ICD-10-CM | POA: Diagnosis not present

## 2017-06-29 DIAGNOSIS — I70235 Atherosclerosis of native arteries of right leg with ulceration of other part of foot: Secondary | ICD-10-CM | POA: Diagnosis not present

## 2017-06-30 DIAGNOSIS — M6281 Muscle weakness (generalized): Secondary | ICD-10-CM | POA: Diagnosis not present

## 2017-06-30 DIAGNOSIS — R2689 Other abnormalities of gait and mobility: Secondary | ICD-10-CM | POA: Diagnosis not present

## 2017-07-01 DIAGNOSIS — M6281 Muscle weakness (generalized): Secondary | ICD-10-CM | POA: Diagnosis not present

## 2017-07-01 DIAGNOSIS — R2689 Other abnormalities of gait and mobility: Secondary | ICD-10-CM | POA: Diagnosis not present

## 2017-07-02 DIAGNOSIS — R2689 Other abnormalities of gait and mobility: Secondary | ICD-10-CM | POA: Diagnosis not present

## 2017-07-02 DIAGNOSIS — M6281 Muscle weakness (generalized): Secondary | ICD-10-CM | POA: Diagnosis not present

## 2017-07-03 DIAGNOSIS — R2689 Other abnormalities of gait and mobility: Secondary | ICD-10-CM | POA: Diagnosis not present

## 2017-07-03 DIAGNOSIS — M6281 Muscle weakness (generalized): Secondary | ICD-10-CM | POA: Diagnosis not present

## 2017-07-06 DIAGNOSIS — R2689 Other abnormalities of gait and mobility: Secondary | ICD-10-CM | POA: Diagnosis not present

## 2017-07-06 DIAGNOSIS — M6281 Muscle weakness (generalized): Secondary | ICD-10-CM | POA: Diagnosis not present

## 2017-07-07 DIAGNOSIS — M6281 Muscle weakness (generalized): Secondary | ICD-10-CM | POA: Diagnosis not present

## 2017-07-07 DIAGNOSIS — R2689 Other abnormalities of gait and mobility: Secondary | ICD-10-CM | POA: Diagnosis not present

## 2017-07-08 DIAGNOSIS — M6281 Muscle weakness (generalized): Secondary | ICD-10-CM | POA: Diagnosis not present

## 2017-07-08 DIAGNOSIS — R2689 Other abnormalities of gait and mobility: Secondary | ICD-10-CM | POA: Diagnosis not present

## 2017-07-09 DIAGNOSIS — R2689 Other abnormalities of gait and mobility: Secondary | ICD-10-CM | POA: Diagnosis not present

## 2017-07-09 DIAGNOSIS — M6281 Muscle weakness (generalized): Secondary | ICD-10-CM | POA: Diagnosis not present

## 2017-07-10 DIAGNOSIS — M6281 Muscle weakness (generalized): Secondary | ICD-10-CM | POA: Diagnosis not present

## 2017-07-10 DIAGNOSIS — R2689 Other abnormalities of gait and mobility: Secondary | ICD-10-CM | POA: Diagnosis not present

## 2017-07-13 DIAGNOSIS — R2689 Other abnormalities of gait and mobility: Secondary | ICD-10-CM | POA: Diagnosis not present

## 2017-07-13 DIAGNOSIS — I70235 Atherosclerosis of native arteries of right leg with ulceration of other part of foot: Secondary | ICD-10-CM | POA: Diagnosis not present

## 2017-07-13 DIAGNOSIS — M6281 Muscle weakness (generalized): Secondary | ICD-10-CM | POA: Diagnosis not present

## 2017-07-14 DIAGNOSIS — R2689 Other abnormalities of gait and mobility: Secondary | ICD-10-CM | POA: Diagnosis not present

## 2017-07-14 DIAGNOSIS — M6281 Muscle weakness (generalized): Secondary | ICD-10-CM | POA: Diagnosis not present

## 2017-07-15 DIAGNOSIS — M6281 Muscle weakness (generalized): Secondary | ICD-10-CM | POA: Diagnosis not present

## 2017-07-15 DIAGNOSIS — R2689 Other abnormalities of gait and mobility: Secondary | ICD-10-CM | POA: Diagnosis not present

## 2017-07-16 DIAGNOSIS — M6281 Muscle weakness (generalized): Secondary | ICD-10-CM | POA: Diagnosis not present

## 2017-07-16 DIAGNOSIS — R2689 Other abnormalities of gait and mobility: Secondary | ICD-10-CM | POA: Diagnosis not present

## 2017-07-17 DIAGNOSIS — R2689 Other abnormalities of gait and mobility: Secondary | ICD-10-CM | POA: Diagnosis not present

## 2017-07-17 DIAGNOSIS — M6281 Muscle weakness (generalized): Secondary | ICD-10-CM | POA: Diagnosis not present

## 2017-07-20 DIAGNOSIS — M6281 Muscle weakness (generalized): Secondary | ICD-10-CM | POA: Diagnosis not present

## 2017-07-20 DIAGNOSIS — L603 Nail dystrophy: Secondary | ICD-10-CM | POA: Diagnosis not present

## 2017-07-20 DIAGNOSIS — R2689 Other abnormalities of gait and mobility: Secondary | ICD-10-CM | POA: Diagnosis not present

## 2017-07-20 DIAGNOSIS — B351 Tinea unguium: Secondary | ICD-10-CM | POA: Diagnosis not present

## 2017-07-20 DIAGNOSIS — I739 Peripheral vascular disease, unspecified: Secondary | ICD-10-CM | POA: Diagnosis not present

## 2017-07-20 DIAGNOSIS — M2042 Other hammer toe(s) (acquired), left foot: Secondary | ICD-10-CM | POA: Diagnosis not present

## 2017-07-20 DIAGNOSIS — M2041 Other hammer toe(s) (acquired), right foot: Secondary | ICD-10-CM | POA: Diagnosis not present

## 2017-07-21 DIAGNOSIS — M6281 Muscle weakness (generalized): Secondary | ICD-10-CM | POA: Diagnosis not present

## 2017-07-21 DIAGNOSIS — R2689 Other abnormalities of gait and mobility: Secondary | ICD-10-CM | POA: Diagnosis not present

## 2017-07-22 DIAGNOSIS — R2689 Other abnormalities of gait and mobility: Secondary | ICD-10-CM | POA: Diagnosis not present

## 2017-07-22 DIAGNOSIS — M6281 Muscle weakness (generalized): Secondary | ICD-10-CM | POA: Diagnosis not present

## 2017-07-23 DIAGNOSIS — M6281 Muscle weakness (generalized): Secondary | ICD-10-CM | POA: Diagnosis not present

## 2017-07-23 DIAGNOSIS — R2689 Other abnormalities of gait and mobility: Secondary | ICD-10-CM | POA: Diagnosis not present

## 2017-07-24 DIAGNOSIS — M6281 Muscle weakness (generalized): Secondary | ICD-10-CM | POA: Diagnosis not present

## 2017-07-24 DIAGNOSIS — R2689 Other abnormalities of gait and mobility: Secondary | ICD-10-CM | POA: Diagnosis not present

## 2017-07-27 DIAGNOSIS — I70235 Atherosclerosis of native arteries of right leg with ulceration of other part of foot: Secondary | ICD-10-CM | POA: Diagnosis not present

## 2017-07-27 DIAGNOSIS — M6281 Muscle weakness (generalized): Secondary | ICD-10-CM | POA: Diagnosis not present

## 2017-07-27 DIAGNOSIS — R2689 Other abnormalities of gait and mobility: Secondary | ICD-10-CM | POA: Diagnosis not present

## 2017-07-28 DIAGNOSIS — R2689 Other abnormalities of gait and mobility: Secondary | ICD-10-CM | POA: Diagnosis not present

## 2017-07-28 DIAGNOSIS — M6281 Muscle weakness (generalized): Secondary | ICD-10-CM | POA: Diagnosis not present

## 2017-07-29 DIAGNOSIS — R2689 Other abnormalities of gait and mobility: Secondary | ICD-10-CM | POA: Diagnosis not present

## 2017-07-29 DIAGNOSIS — M6281 Muscle weakness (generalized): Secondary | ICD-10-CM | POA: Diagnosis not present

## 2017-07-30 DIAGNOSIS — M6281 Muscle weakness (generalized): Secondary | ICD-10-CM | POA: Diagnosis not present

## 2017-07-30 DIAGNOSIS — R2689 Other abnormalities of gait and mobility: Secondary | ICD-10-CM | POA: Diagnosis not present

## 2017-07-31 DIAGNOSIS — M6281 Muscle weakness (generalized): Secondary | ICD-10-CM | POA: Diagnosis not present

## 2017-07-31 DIAGNOSIS — R2689 Other abnormalities of gait and mobility: Secondary | ICD-10-CM | POA: Diagnosis not present

## 2017-08-04 DIAGNOSIS — I679 Cerebrovascular disease, unspecified: Secondary | ICD-10-CM | POA: Diagnosis not present

## 2017-08-04 DIAGNOSIS — N390 Urinary tract infection, site not specified: Secondary | ICD-10-CM | POA: Diagnosis not present

## 2017-08-04 DIAGNOSIS — K219 Gastro-esophageal reflux disease without esophagitis: Secondary | ICD-10-CM | POA: Diagnosis not present

## 2017-08-04 DIAGNOSIS — E039 Hypothyroidism, unspecified: Secondary | ICD-10-CM | POA: Diagnosis not present

## 2017-08-05 DIAGNOSIS — Z79899 Other long term (current) drug therapy: Secondary | ICD-10-CM | POA: Diagnosis not present

## 2017-08-10 DIAGNOSIS — I70235 Atherosclerosis of native arteries of right leg with ulceration of other part of foot: Secondary | ICD-10-CM | POA: Diagnosis not present

## 2017-08-24 DIAGNOSIS — I70235 Atherosclerosis of native arteries of right leg with ulceration of other part of foot: Secondary | ICD-10-CM | POA: Diagnosis not present

## 2017-09-07 DIAGNOSIS — I70235 Atherosclerosis of native arteries of right leg with ulceration of other part of foot: Secondary | ICD-10-CM | POA: Diagnosis not present

## 2017-09-14 DIAGNOSIS — R1312 Dysphagia, oropharyngeal phase: Secondary | ICD-10-CM | POA: Diagnosis not present

## 2017-09-14 DIAGNOSIS — R2689 Other abnormalities of gait and mobility: Secondary | ICD-10-CM | POA: Diagnosis not present

## 2017-09-14 DIAGNOSIS — M6281 Muscle weakness (generalized): Secondary | ICD-10-CM | POA: Diagnosis not present

## 2017-09-14 DIAGNOSIS — R293 Abnormal posture: Secondary | ICD-10-CM | POA: Diagnosis not present

## 2017-09-15 DIAGNOSIS — R2689 Other abnormalities of gait and mobility: Secondary | ICD-10-CM | POA: Diagnosis not present

## 2017-09-15 DIAGNOSIS — M6281 Muscle weakness (generalized): Secondary | ICD-10-CM | POA: Diagnosis not present

## 2017-09-15 DIAGNOSIS — R1312 Dysphagia, oropharyngeal phase: Secondary | ICD-10-CM | POA: Diagnosis not present

## 2017-09-15 DIAGNOSIS — R293 Abnormal posture: Secondary | ICD-10-CM | POA: Diagnosis not present

## 2017-09-16 DIAGNOSIS — M6281 Muscle weakness (generalized): Secondary | ICD-10-CM | POA: Diagnosis not present

## 2017-09-16 DIAGNOSIS — R2689 Other abnormalities of gait and mobility: Secondary | ICD-10-CM | POA: Diagnosis not present

## 2017-09-16 DIAGNOSIS — R1312 Dysphagia, oropharyngeal phase: Secondary | ICD-10-CM | POA: Diagnosis not present

## 2017-09-16 DIAGNOSIS — R293 Abnormal posture: Secondary | ICD-10-CM | POA: Diagnosis not present

## 2017-09-17 DIAGNOSIS — R2689 Other abnormalities of gait and mobility: Secondary | ICD-10-CM | POA: Diagnosis not present

## 2017-09-17 DIAGNOSIS — R1312 Dysphagia, oropharyngeal phase: Secondary | ICD-10-CM | POA: Diagnosis not present

## 2017-09-17 DIAGNOSIS — R293 Abnormal posture: Secondary | ICD-10-CM | POA: Diagnosis not present

## 2017-09-17 DIAGNOSIS — M6281 Muscle weakness (generalized): Secondary | ICD-10-CM | POA: Diagnosis not present

## 2017-09-18 DIAGNOSIS — R2689 Other abnormalities of gait and mobility: Secondary | ICD-10-CM | POA: Diagnosis not present

## 2017-09-18 DIAGNOSIS — R1312 Dysphagia, oropharyngeal phase: Secondary | ICD-10-CM | POA: Diagnosis not present

## 2017-09-18 DIAGNOSIS — R293 Abnormal posture: Secondary | ICD-10-CM | POA: Diagnosis not present

## 2017-09-18 DIAGNOSIS — M6281 Muscle weakness (generalized): Secondary | ICD-10-CM | POA: Diagnosis not present

## 2017-09-21 DIAGNOSIS — I70235 Atherosclerosis of native arteries of right leg with ulceration of other part of foot: Secondary | ICD-10-CM | POA: Diagnosis not present

## 2017-09-21 DIAGNOSIS — M6281 Muscle weakness (generalized): Secondary | ICD-10-CM | POA: Diagnosis not present

## 2017-09-21 DIAGNOSIS — R293 Abnormal posture: Secondary | ICD-10-CM | POA: Diagnosis not present

## 2017-09-21 DIAGNOSIS — R1312 Dysphagia, oropharyngeal phase: Secondary | ICD-10-CM | POA: Diagnosis not present

## 2017-09-21 DIAGNOSIS — R2689 Other abnormalities of gait and mobility: Secondary | ICD-10-CM | POA: Diagnosis not present

## 2017-09-22 DIAGNOSIS — M6281 Muscle weakness (generalized): Secondary | ICD-10-CM | POA: Diagnosis not present

## 2017-09-22 DIAGNOSIS — R1312 Dysphagia, oropharyngeal phase: Secondary | ICD-10-CM | POA: Diagnosis not present

## 2017-09-22 DIAGNOSIS — R293 Abnormal posture: Secondary | ICD-10-CM | POA: Diagnosis not present

## 2017-09-22 DIAGNOSIS — R2689 Other abnormalities of gait and mobility: Secondary | ICD-10-CM | POA: Diagnosis not present

## 2017-09-23 DIAGNOSIS — R1312 Dysphagia, oropharyngeal phase: Secondary | ICD-10-CM | POA: Diagnosis not present

## 2017-09-23 DIAGNOSIS — R2689 Other abnormalities of gait and mobility: Secondary | ICD-10-CM | POA: Diagnosis not present

## 2017-09-23 DIAGNOSIS — M6281 Muscle weakness (generalized): Secondary | ICD-10-CM | POA: Diagnosis not present

## 2017-09-23 DIAGNOSIS — R293 Abnormal posture: Secondary | ICD-10-CM | POA: Diagnosis not present

## 2017-09-24 DIAGNOSIS — R2689 Other abnormalities of gait and mobility: Secondary | ICD-10-CM | POA: Diagnosis not present

## 2017-09-24 DIAGNOSIS — R1312 Dysphagia, oropharyngeal phase: Secondary | ICD-10-CM | POA: Diagnosis not present

## 2017-09-24 DIAGNOSIS — R293 Abnormal posture: Secondary | ICD-10-CM | POA: Diagnosis not present

## 2017-09-24 DIAGNOSIS — M6281 Muscle weakness (generalized): Secondary | ICD-10-CM | POA: Diagnosis not present

## 2017-09-25 DIAGNOSIS — R293 Abnormal posture: Secondary | ICD-10-CM | POA: Diagnosis not present

## 2017-09-25 DIAGNOSIS — R2689 Other abnormalities of gait and mobility: Secondary | ICD-10-CM | POA: Diagnosis not present

## 2017-09-25 DIAGNOSIS — R1312 Dysphagia, oropharyngeal phase: Secondary | ICD-10-CM | POA: Diagnosis not present

## 2017-09-25 DIAGNOSIS — M6281 Muscle weakness (generalized): Secondary | ICD-10-CM | POA: Diagnosis not present

## 2017-09-28 DIAGNOSIS — R293 Abnormal posture: Secondary | ICD-10-CM | POA: Diagnosis not present

## 2017-09-28 DIAGNOSIS — R1314 Dysphagia, pharyngoesophageal phase: Secondary | ICD-10-CM | POA: Diagnosis not present

## 2017-09-28 DIAGNOSIS — M6281 Muscle weakness (generalized): Secondary | ICD-10-CM | POA: Diagnosis not present

## 2017-09-28 DIAGNOSIS — R2689 Other abnormalities of gait and mobility: Secondary | ICD-10-CM | POA: Diagnosis not present

## 2017-09-29 DIAGNOSIS — R293 Abnormal posture: Secondary | ICD-10-CM | POA: Diagnosis not present

## 2017-09-29 DIAGNOSIS — M6281 Muscle weakness (generalized): Secondary | ICD-10-CM | POA: Diagnosis not present

## 2017-09-29 DIAGNOSIS — R1314 Dysphagia, pharyngoesophageal phase: Secondary | ICD-10-CM | POA: Diagnosis not present

## 2017-09-29 DIAGNOSIS — R2689 Other abnormalities of gait and mobility: Secondary | ICD-10-CM | POA: Diagnosis not present

## 2017-09-30 DIAGNOSIS — R293 Abnormal posture: Secondary | ICD-10-CM | POA: Diagnosis not present

## 2017-09-30 DIAGNOSIS — R2689 Other abnormalities of gait and mobility: Secondary | ICD-10-CM | POA: Diagnosis not present

## 2017-09-30 DIAGNOSIS — R1314 Dysphagia, pharyngoesophageal phase: Secondary | ICD-10-CM | POA: Diagnosis not present

## 2017-09-30 DIAGNOSIS — M6281 Muscle weakness (generalized): Secondary | ICD-10-CM | POA: Diagnosis not present

## 2017-10-01 DIAGNOSIS — R2689 Other abnormalities of gait and mobility: Secondary | ICD-10-CM | POA: Diagnosis not present

## 2017-10-01 DIAGNOSIS — R1314 Dysphagia, pharyngoesophageal phase: Secondary | ICD-10-CM | POA: Diagnosis not present

## 2017-10-01 DIAGNOSIS — R293 Abnormal posture: Secondary | ICD-10-CM | POA: Diagnosis not present

## 2017-10-01 DIAGNOSIS — M6281 Muscle weakness (generalized): Secondary | ICD-10-CM | POA: Diagnosis not present

## 2017-10-02 DIAGNOSIS — M6281 Muscle weakness (generalized): Secondary | ICD-10-CM | POA: Diagnosis not present

## 2017-10-02 DIAGNOSIS — R293 Abnormal posture: Secondary | ICD-10-CM | POA: Diagnosis not present

## 2017-10-02 DIAGNOSIS — R2689 Other abnormalities of gait and mobility: Secondary | ICD-10-CM | POA: Diagnosis not present

## 2017-10-02 DIAGNOSIS — R1314 Dysphagia, pharyngoesophageal phase: Secondary | ICD-10-CM | POA: Diagnosis not present

## 2017-10-05 DIAGNOSIS — R1314 Dysphagia, pharyngoesophageal phase: Secondary | ICD-10-CM | POA: Diagnosis not present

## 2017-10-05 DIAGNOSIS — M6281 Muscle weakness (generalized): Secondary | ICD-10-CM | POA: Diagnosis not present

## 2017-10-05 DIAGNOSIS — R293 Abnormal posture: Secondary | ICD-10-CM | POA: Diagnosis not present

## 2017-10-05 DIAGNOSIS — R2689 Other abnormalities of gait and mobility: Secondary | ICD-10-CM | POA: Diagnosis not present

## 2017-10-05 DIAGNOSIS — I70235 Atherosclerosis of native arteries of right leg with ulceration of other part of foot: Secondary | ICD-10-CM | POA: Diagnosis not present

## 2017-10-06 DIAGNOSIS — M6281 Muscle weakness (generalized): Secondary | ICD-10-CM | POA: Diagnosis not present

## 2017-10-06 DIAGNOSIS — R293 Abnormal posture: Secondary | ICD-10-CM | POA: Diagnosis not present

## 2017-10-06 DIAGNOSIS — R1314 Dysphagia, pharyngoesophageal phase: Secondary | ICD-10-CM | POA: Diagnosis not present

## 2017-10-06 DIAGNOSIS — R2689 Other abnormalities of gait and mobility: Secondary | ICD-10-CM | POA: Diagnosis not present

## 2017-10-07 DIAGNOSIS — R1314 Dysphagia, pharyngoesophageal phase: Secondary | ICD-10-CM | POA: Diagnosis not present

## 2017-10-07 DIAGNOSIS — R2689 Other abnormalities of gait and mobility: Secondary | ICD-10-CM | POA: Diagnosis not present

## 2017-10-07 DIAGNOSIS — M6281 Muscle weakness (generalized): Secondary | ICD-10-CM | POA: Diagnosis not present

## 2017-10-07 DIAGNOSIS — R293 Abnormal posture: Secondary | ICD-10-CM | POA: Diagnosis not present

## 2017-10-08 DIAGNOSIS — R1314 Dysphagia, pharyngoesophageal phase: Secondary | ICD-10-CM | POA: Diagnosis not present

## 2017-10-08 DIAGNOSIS — M6281 Muscle weakness (generalized): Secondary | ICD-10-CM | POA: Diagnosis not present

## 2017-10-08 DIAGNOSIS — R293 Abnormal posture: Secondary | ICD-10-CM | POA: Diagnosis not present

## 2017-10-08 DIAGNOSIS — R2689 Other abnormalities of gait and mobility: Secondary | ICD-10-CM | POA: Diagnosis not present

## 2017-10-09 DIAGNOSIS — R293 Abnormal posture: Secondary | ICD-10-CM | POA: Diagnosis not present

## 2017-10-09 DIAGNOSIS — R1314 Dysphagia, pharyngoesophageal phase: Secondary | ICD-10-CM | POA: Diagnosis not present

## 2017-10-09 DIAGNOSIS — M6281 Muscle weakness (generalized): Secondary | ICD-10-CM | POA: Diagnosis not present

## 2017-10-09 DIAGNOSIS — R2689 Other abnormalities of gait and mobility: Secondary | ICD-10-CM | POA: Diagnosis not present

## 2017-10-12 DIAGNOSIS — R293 Abnormal posture: Secondary | ICD-10-CM | POA: Diagnosis not present

## 2017-10-12 DIAGNOSIS — R1314 Dysphagia, pharyngoesophageal phase: Secondary | ICD-10-CM | POA: Diagnosis not present

## 2017-10-12 DIAGNOSIS — M069 Rheumatoid arthritis, unspecified: Secondary | ICD-10-CM | POA: Diagnosis not present

## 2017-10-12 DIAGNOSIS — E039 Hypothyroidism, unspecified: Secondary | ICD-10-CM | POA: Diagnosis not present

## 2017-10-12 DIAGNOSIS — L97519 Non-pressure chronic ulcer of other part of right foot with unspecified severity: Secondary | ICD-10-CM | POA: Diagnosis not present

## 2017-10-12 DIAGNOSIS — R2689 Other abnormalities of gait and mobility: Secondary | ICD-10-CM | POA: Diagnosis not present

## 2017-10-12 DIAGNOSIS — I679 Cerebrovascular disease, unspecified: Secondary | ICD-10-CM | POA: Diagnosis not present

## 2017-10-12 DIAGNOSIS — M6281 Muscle weakness (generalized): Secondary | ICD-10-CM | POA: Diagnosis not present

## 2017-10-13 DIAGNOSIS — R1314 Dysphagia, pharyngoesophageal phase: Secondary | ICD-10-CM | POA: Diagnosis not present

## 2017-10-13 DIAGNOSIS — R293 Abnormal posture: Secondary | ICD-10-CM | POA: Diagnosis not present

## 2017-10-13 DIAGNOSIS — M6281 Muscle weakness (generalized): Secondary | ICD-10-CM | POA: Diagnosis not present

## 2017-10-13 DIAGNOSIS — R2689 Other abnormalities of gait and mobility: Secondary | ICD-10-CM | POA: Diagnosis not present

## 2017-10-14 DIAGNOSIS — R293 Abnormal posture: Secondary | ICD-10-CM | POA: Diagnosis not present

## 2017-10-14 DIAGNOSIS — M6281 Muscle weakness (generalized): Secondary | ICD-10-CM | POA: Diagnosis not present

## 2017-10-14 DIAGNOSIS — R2689 Other abnormalities of gait and mobility: Secondary | ICD-10-CM | POA: Diagnosis not present

## 2017-10-14 DIAGNOSIS — R1314 Dysphagia, pharyngoesophageal phase: Secondary | ICD-10-CM | POA: Diagnosis not present

## 2017-10-16 DIAGNOSIS — R293 Abnormal posture: Secondary | ICD-10-CM | POA: Diagnosis not present

## 2017-10-16 DIAGNOSIS — R1314 Dysphagia, pharyngoesophageal phase: Secondary | ICD-10-CM | POA: Diagnosis not present

## 2017-10-16 DIAGNOSIS — M6281 Muscle weakness (generalized): Secondary | ICD-10-CM | POA: Diagnosis not present

## 2017-10-16 DIAGNOSIS — R2689 Other abnormalities of gait and mobility: Secondary | ICD-10-CM | POA: Diagnosis not present

## 2017-10-19 DIAGNOSIS — M6281 Muscle weakness (generalized): Secondary | ICD-10-CM | POA: Diagnosis not present

## 2017-10-19 DIAGNOSIS — I70235 Atherosclerosis of native arteries of right leg with ulceration of other part of foot: Secondary | ICD-10-CM | POA: Diagnosis not present

## 2017-10-19 DIAGNOSIS — R1314 Dysphagia, pharyngoesophageal phase: Secondary | ICD-10-CM | POA: Diagnosis not present

## 2017-10-19 DIAGNOSIS — R293 Abnormal posture: Secondary | ICD-10-CM | POA: Diagnosis not present

## 2017-10-19 DIAGNOSIS — R2689 Other abnormalities of gait and mobility: Secondary | ICD-10-CM | POA: Diagnosis not present

## 2017-10-20 DIAGNOSIS — F339 Major depressive disorder, recurrent, unspecified: Secondary | ICD-10-CM | POA: Diagnosis not present

## 2017-10-20 DIAGNOSIS — M6281 Muscle weakness (generalized): Secondary | ICD-10-CM | POA: Diagnosis not present

## 2017-10-20 DIAGNOSIS — R2689 Other abnormalities of gait and mobility: Secondary | ICD-10-CM | POA: Diagnosis not present

## 2017-10-20 DIAGNOSIS — R293 Abnormal posture: Secondary | ICD-10-CM | POA: Diagnosis not present

## 2017-10-20 DIAGNOSIS — F411 Generalized anxiety disorder: Secondary | ICD-10-CM | POA: Diagnosis not present

## 2017-10-20 DIAGNOSIS — R1314 Dysphagia, pharyngoesophageal phase: Secondary | ICD-10-CM | POA: Diagnosis not present

## 2017-10-21 DIAGNOSIS — R293 Abnormal posture: Secondary | ICD-10-CM | POA: Diagnosis not present

## 2017-10-21 DIAGNOSIS — R1314 Dysphagia, pharyngoesophageal phase: Secondary | ICD-10-CM | POA: Diagnosis not present

## 2017-10-21 DIAGNOSIS — M6281 Muscle weakness (generalized): Secondary | ICD-10-CM | POA: Diagnosis not present

## 2017-10-21 DIAGNOSIS — R2689 Other abnormalities of gait and mobility: Secondary | ICD-10-CM | POA: Diagnosis not present

## 2017-10-22 DIAGNOSIS — R293 Abnormal posture: Secondary | ICD-10-CM | POA: Diagnosis not present

## 2017-10-22 DIAGNOSIS — R2689 Other abnormalities of gait and mobility: Secondary | ICD-10-CM | POA: Diagnosis not present

## 2017-10-22 DIAGNOSIS — M6281 Muscle weakness (generalized): Secondary | ICD-10-CM | POA: Diagnosis not present

## 2017-10-22 DIAGNOSIS — R1314 Dysphagia, pharyngoesophageal phase: Secondary | ICD-10-CM | POA: Diagnosis not present

## 2017-10-26 DIAGNOSIS — R2689 Other abnormalities of gait and mobility: Secondary | ICD-10-CM | POA: Diagnosis not present

## 2017-10-26 DIAGNOSIS — M6281 Muscle weakness (generalized): Secondary | ICD-10-CM | POA: Diagnosis not present

## 2017-10-26 DIAGNOSIS — R293 Abnormal posture: Secondary | ICD-10-CM | POA: Diagnosis not present

## 2017-10-26 DIAGNOSIS — R1314 Dysphagia, pharyngoesophageal phase: Secondary | ICD-10-CM | POA: Diagnosis not present

## 2017-10-27 DIAGNOSIS — R293 Abnormal posture: Secondary | ICD-10-CM | POA: Diagnosis not present

## 2017-10-27 DIAGNOSIS — M6281 Muscle weakness (generalized): Secondary | ICD-10-CM | POA: Diagnosis not present

## 2017-10-27 DIAGNOSIS — R1314 Dysphagia, pharyngoesophageal phase: Secondary | ICD-10-CM | POA: Diagnosis not present

## 2017-10-27 DIAGNOSIS — R2689 Other abnormalities of gait and mobility: Secondary | ICD-10-CM | POA: Diagnosis not present

## 2017-10-28 DIAGNOSIS — R2689 Other abnormalities of gait and mobility: Secondary | ICD-10-CM | POA: Diagnosis not present

## 2017-10-28 DIAGNOSIS — M6281 Muscle weakness (generalized): Secondary | ICD-10-CM | POA: Diagnosis not present

## 2017-10-29 DIAGNOSIS — M6281 Muscle weakness (generalized): Secondary | ICD-10-CM | POA: Diagnosis not present

## 2017-10-29 DIAGNOSIS — R2689 Other abnormalities of gait and mobility: Secondary | ICD-10-CM | POA: Diagnosis not present

## 2017-10-30 DIAGNOSIS — M6281 Muscle weakness (generalized): Secondary | ICD-10-CM | POA: Diagnosis not present

## 2017-10-30 DIAGNOSIS — R2689 Other abnormalities of gait and mobility: Secondary | ICD-10-CM | POA: Diagnosis not present

## 2017-11-02 DIAGNOSIS — R0602 Shortness of breath: Secondary | ICD-10-CM | POA: Diagnosis not present

## 2017-11-02 DIAGNOSIS — I70235 Atherosclerosis of native arteries of right leg with ulceration of other part of foot: Secondary | ICD-10-CM | POA: Diagnosis not present

## 2017-11-02 DIAGNOSIS — R1110 Vomiting, unspecified: Secondary | ICD-10-CM | POA: Diagnosis not present

## 2017-11-04 DIAGNOSIS — R2689 Other abnormalities of gait and mobility: Secondary | ICD-10-CM | POA: Diagnosis not present

## 2017-11-04 DIAGNOSIS — M6281 Muscle weakness (generalized): Secondary | ICD-10-CM | POA: Diagnosis not present

## 2017-11-05 DIAGNOSIS — I739 Peripheral vascular disease, unspecified: Secondary | ICD-10-CM | POA: Diagnosis not present

## 2017-11-05 DIAGNOSIS — B351 Tinea unguium: Secondary | ICD-10-CM | POA: Diagnosis not present

## 2017-11-05 DIAGNOSIS — M6281 Muscle weakness (generalized): Secondary | ICD-10-CM | POA: Diagnosis not present

## 2017-11-05 DIAGNOSIS — R2689 Other abnormalities of gait and mobility: Secondary | ICD-10-CM | POA: Diagnosis not present

## 2017-11-06 DIAGNOSIS — M6281 Muscle weakness (generalized): Secondary | ICD-10-CM | POA: Diagnosis not present

## 2017-11-06 DIAGNOSIS — R2689 Other abnormalities of gait and mobility: Secondary | ICD-10-CM | POA: Diagnosis not present

## 2017-11-09 DIAGNOSIS — M6281 Muscle weakness (generalized): Secondary | ICD-10-CM | POA: Diagnosis not present

## 2017-11-09 DIAGNOSIS — J181 Lobar pneumonia, unspecified organism: Secondary | ICD-10-CM | POA: Diagnosis not present

## 2017-11-09 DIAGNOSIS — R2689 Other abnormalities of gait and mobility: Secondary | ICD-10-CM | POA: Diagnosis not present

## 2017-11-10 DIAGNOSIS — R2689 Other abnormalities of gait and mobility: Secondary | ICD-10-CM | POA: Diagnosis not present

## 2017-11-10 DIAGNOSIS — M6281 Muscle weakness (generalized): Secondary | ICD-10-CM | POA: Diagnosis not present

## 2017-11-11 DIAGNOSIS — R2689 Other abnormalities of gait and mobility: Secondary | ICD-10-CM | POA: Diagnosis not present

## 2017-11-11 DIAGNOSIS — M6281 Muscle weakness (generalized): Secondary | ICD-10-CM | POA: Diagnosis not present

## 2017-11-12 DIAGNOSIS — M6281 Muscle weakness (generalized): Secondary | ICD-10-CM | POA: Diagnosis not present

## 2017-11-12 DIAGNOSIS — R2689 Other abnormalities of gait and mobility: Secondary | ICD-10-CM | POA: Diagnosis not present

## 2017-11-13 ENCOUNTER — Other Ambulatory Visit: Payer: Self-pay

## 2017-11-13 DIAGNOSIS — M6281 Muscle weakness (generalized): Secondary | ICD-10-CM | POA: Diagnosis not present

## 2017-11-13 DIAGNOSIS — R2689 Other abnormalities of gait and mobility: Secondary | ICD-10-CM | POA: Diagnosis not present

## 2017-11-13 DIAGNOSIS — I739 Peripheral vascular disease, unspecified: Secondary | ICD-10-CM

## 2017-11-16 DIAGNOSIS — I70235 Atherosclerosis of native arteries of right leg with ulceration of other part of foot: Secondary | ICD-10-CM | POA: Diagnosis not present

## 2017-11-16 DIAGNOSIS — R05 Cough: Secondary | ICD-10-CM | POA: Diagnosis not present

## 2017-11-17 DIAGNOSIS — J189 Pneumonia, unspecified organism: Secondary | ICD-10-CM | POA: Diagnosis not present

## 2017-11-17 DIAGNOSIS — R112 Nausea with vomiting, unspecified: Secondary | ICD-10-CM | POA: Diagnosis not present

## 2017-11-17 DIAGNOSIS — R1110 Vomiting, unspecified: Secondary | ICD-10-CM | POA: Diagnosis not present

## 2017-11-17 DIAGNOSIS — Z79899 Other long term (current) drug therapy: Secondary | ICD-10-CM | POA: Diagnosis not present

## 2017-11-17 DIAGNOSIS — D649 Anemia, unspecified: Secondary | ICD-10-CM | POA: Diagnosis not present

## 2017-11-19 DIAGNOSIS — I509 Heart failure, unspecified: Secondary | ICD-10-CM | POA: Diagnosis not present

## 2017-11-24 DIAGNOSIS — R2689 Other abnormalities of gait and mobility: Secondary | ICD-10-CM | POA: Diagnosis not present

## 2017-11-24 DIAGNOSIS — M6281 Muscle weakness (generalized): Secondary | ICD-10-CM | POA: Diagnosis not present

## 2017-11-25 DIAGNOSIS — R2689 Other abnormalities of gait and mobility: Secondary | ICD-10-CM | POA: Diagnosis not present

## 2017-11-25 DIAGNOSIS — M6281 Muscle weakness (generalized): Secondary | ICD-10-CM | POA: Diagnosis not present

## 2017-11-27 DIAGNOSIS — M6281 Muscle weakness (generalized): Secondary | ICD-10-CM | POA: Diagnosis not present

## 2017-11-27 DIAGNOSIS — R2689 Other abnormalities of gait and mobility: Secondary | ICD-10-CM | POA: Diagnosis not present

## 2017-11-28 DIAGNOSIS — M6281 Muscle weakness (generalized): Secondary | ICD-10-CM | POA: Diagnosis not present

## 2017-11-28 DIAGNOSIS — R2689 Other abnormalities of gait and mobility: Secondary | ICD-10-CM | POA: Diagnosis not present

## 2017-11-30 DIAGNOSIS — R2689 Other abnormalities of gait and mobility: Secondary | ICD-10-CM | POA: Diagnosis not present

## 2017-11-30 DIAGNOSIS — M6281 Muscle weakness (generalized): Secondary | ICD-10-CM | POA: Diagnosis not present

## 2017-11-30 DIAGNOSIS — I70235 Atherosclerosis of native arteries of right leg with ulceration of other part of foot: Secondary | ICD-10-CM | POA: Diagnosis not present

## 2017-12-01 DIAGNOSIS — M6281 Muscle weakness (generalized): Secondary | ICD-10-CM | POA: Diagnosis not present

## 2017-12-01 DIAGNOSIS — R2689 Other abnormalities of gait and mobility: Secondary | ICD-10-CM | POA: Diagnosis not present

## 2017-12-02 DIAGNOSIS — R2689 Other abnormalities of gait and mobility: Secondary | ICD-10-CM | POA: Diagnosis not present

## 2017-12-02 DIAGNOSIS — M6281 Muscle weakness (generalized): Secondary | ICD-10-CM | POA: Diagnosis not present

## 2017-12-03 DIAGNOSIS — D509 Iron deficiency anemia, unspecified: Secondary | ICD-10-CM | POA: Diagnosis not present

## 2017-12-14 DIAGNOSIS — I70235 Atherosclerosis of native arteries of right leg with ulceration of other part of foot: Secondary | ICD-10-CM | POA: Diagnosis not present

## 2017-12-14 DIAGNOSIS — E785 Hyperlipidemia, unspecified: Secondary | ICD-10-CM | POA: Diagnosis not present

## 2017-12-15 DIAGNOSIS — L97519 Non-pressure chronic ulcer of other part of right foot with unspecified severity: Secondary | ICD-10-CM | POA: Diagnosis not present

## 2017-12-15 DIAGNOSIS — F339 Major depressive disorder, recurrent, unspecified: Secondary | ICD-10-CM | POA: Diagnosis not present

## 2017-12-15 DIAGNOSIS — E039 Hypothyroidism, unspecified: Secondary | ICD-10-CM | POA: Diagnosis not present

## 2017-12-17 DIAGNOSIS — F329 Major depressive disorder, single episode, unspecified: Secondary | ICD-10-CM | POA: Diagnosis not present

## 2017-12-17 DIAGNOSIS — F411 Generalized anxiety disorder: Secondary | ICD-10-CM | POA: Diagnosis not present

## 2017-12-21 ENCOUNTER — Encounter: Payer: Self-pay | Admitting: Orthopaedic Surgery

## 2017-12-21 DIAGNOSIS — H04123 Dry eye syndrome of bilateral lacrimal glands: Secondary | ICD-10-CM | POA: Diagnosis not present

## 2017-12-21 DIAGNOSIS — H26491 Other secondary cataract, right eye: Secondary | ICD-10-CM | POA: Diagnosis not present

## 2017-12-21 DIAGNOSIS — M3501 Sicca syndrome with keratoconjunctivitis: Secondary | ICD-10-CM | POA: Diagnosis not present

## 2017-12-21 DIAGNOSIS — Z961 Presence of intraocular lens: Secondary | ICD-10-CM | POA: Diagnosis not present

## 2017-12-23 ENCOUNTER — Encounter: Payer: Self-pay | Admitting: Vascular Surgery

## 2017-12-23 ENCOUNTER — Ambulatory Visit (HOSPITAL_COMMUNITY)
Admission: RE | Admit: 2017-12-23 | Discharge: 2017-12-23 | Disposition: A | Discharge status: Home / Self Care | Payer: Medicare Other | Source: Ambulatory Visit | Attending: Vascular Surgery | Admitting: Vascular Surgery

## 2017-12-23 ENCOUNTER — Other Ambulatory Visit: Payer: Self-pay

## 2017-12-23 ENCOUNTER — Encounter

## 2017-12-23 ENCOUNTER — Ambulatory Visit (INDEPENDENT_AMBULATORY_CARE_PROVIDER_SITE_OTHER): Payer: Medicare Other | Admitting: Vascular Surgery

## 2017-12-23 VITALS — BP 179/89 | HR 81 | Temp 97.7°F | Resp 20 | Ht 66.0 in | Wt 125.0 lb

## 2017-12-23 DIAGNOSIS — I739 Peripheral vascular disease, unspecified: Secondary | ICD-10-CM | POA: Diagnosis not present

## 2017-12-23 DIAGNOSIS — I70299 Other atherosclerosis of native arteries of extremities, unspecified extremity: Secondary | ICD-10-CM

## 2017-12-23 DIAGNOSIS — L97909 Non-pressure chronic ulcer of unspecified part of unspecified lower leg with unspecified severity: Secondary | ICD-10-CM | POA: Diagnosis not present

## 2017-12-23 NOTE — Progress Notes (Signed)
Patient name: [name] MRN: [id number] DOB: July 24, 1929 Sex: female   REASON FOR CONSULT:    Nonhealing wound right great toe.  The consult is requested by [facility] and rehabilitation.  HPI:   [name] is a pleasant 84 y.o. female, who was referred with an ulcer on the right great toe by Dr. Janie [name].  I have reviewed the notes that were sent with the patient.  Patient had a nonhealing ulcer of the right great toe that was associated with pain.  The patient tells me that she has had a wound on her right great toe for 2 years.  This intermittently causes pain.  She denies any drainage from the toe.  She denies fever or chills.  She has had 3 previous strokes and has significant left-sided weakness.  She tells me that she has been nonambulatory for 2 years.  Thus, there is no history of claudication.  She denies any history of rest pain.  She occasionally gets some pain in the right great toe.  Her risk factors for peripheral vascular disease include hypertension.  She denies any history of diabetes, hypercholesterolemia, family history of premature cardiovascular disease, or tobacco use.  She did have a DVT of her left lower extremity and has been on Eliquis since that time.  This was about 2 years ago.  Past Medical History:  Diagnosis Date  . Anxiety   . Arthritis   . Chronic diastolic CHF (congestive heart failure) ([place])    a. 10/2014 Echo: EF 60-65%, mild LVH, grade 1 DD, dynamic obstruction @ rest - peak velocity of 271 cm/sec, peak grad of 15mmHg, PASP 4mmHg.  Marland Kitchen Coronary artery disease    a. 2012 s/p NSTEMI/Cath: 95% apical LAD dzs, otw nonobs dzs-->Med Rx;  b. 05/2011 MV: no ischemia, EF 79%, inf infarct- attenuation.  . CVA (cerebral vascular accident) ([place])   . Depression   . DVT of lower extremity (deep venous thrombosis) ([place])   . Dyspnea   . Elevated troponin    a. 10/2014 in setting of sepsis/?HCAP.  Marland Kitchen Enteritis due to Clostridium difficile 01/04/2013  .  Essential hypertension   . Fall   . Fatigue   . GERD (gastroesophageal reflux disease)   . GI bleed 2005  . HCAP (healthcare-associated pneumonia) 01/22/2013  . Hemorrhoids   . Hiatal hernia   . Hyperlipidemia   . Hypothyroidism   . Malnutrition ([place])   . Obstructive hypertrophic cardiomyopathy ([place])    a. 10/2014 Echo: EF 60-65%, mild LVH, grade 1 DD, dynamic obstruction @ rest - peak velocity of 271 cm/sec, peak grad of 105mmHg, PASP 72mmHg.  . Osteoporosis   . PUD (peptic ulcer disease)   . RA (rheumatoid arthritis) ([place])   . Recurrent colitis due to Clostridium difficile 06/02/2012  . Renal insufficiency   . Right ear pain   . Sepsis ([place]) 12/19/2012   UTIs  . TIA (transient ischemic attack)   . Weight loss     Family History  Problem Relation Age of Onset  . Kidney disease Mother   . Kidney disease Brother   . Lung cancer Father     SOCIAL HISTORY: Social History   Socioeconomic History  . Marital status: Widowed    Spouse name: Not on file  . Number of children: 3  . Years of education: Not on file  . Highest education level: Not on file  Occupational History  . Occupation: retired Forensic scientist  . Financial  resource strain: Not on file  . Food insecurity:    Worry: Not on file    Inability: Not on file  . Transportation needs:    Medical: Not on file    Non-medical: Not on file  Tobacco Use  . Smoking status: Never Smoker  . Smokeless tobacco: Never Used  Substance and Sexual Activity  . Alcohol use: No  . Drug use: No  . Sexual activity: Never    Birth control/protection: Post-menopausal  Lifestyle  . Physical activity:    Days per week: Not on file    Minutes per session: Not on file  . Stress: Not on file  Relationships  . Social connections:    Talks on phone: Not on file    Gets together: Not on file    Attends religious service: Not on file    Active member of club or organization: Not on file    Attends meetings of clubs or  organizations: Not on file    Relationship status: Not on file  . Intimate partner violence:    Fear of current or ex partner: Not on file    Emotionally abused: Not on file    Physically abused: Not on file    Forced sexual activity: Not on file  Other Topics Concern  . Not on file  Social History Narrative   Patient used to work at [employer] for 25 years until she retired   Widowed   She lives by herself in [place] until she had a stroke earlier in 2013 June. Admitted to [facility] 12/09/11   Her next of kin is her daughter [name], son [name], son [name]   She does get physical therapy was everyday at [place] [facility]   Caffeine use: coffee in the am   Never smoked   Alcohol none   Full Code    Allergies  Allergen Reactions  . Codeine Swelling  . Sulfa Antibiotics Other (See Comments)    GI upset  . Biphosphate Other (See Comments)  . Morphine And Related Other (See Comments)  . Percocet [Oxycodone-Acetaminophen] Other (See Comments)  . Plavix [Clopidogrel Bisulfate] Itching and Rash    Current Outpatient Medications  Medication Sig Dispense Refill  . apixaban (ELIQUIS) 2.5 MG TABS tablet Take 1 tablet (2.5 mg total) by mouth 2 (two) times daily. 60 tablet 0  . Biotin 300 MCG TABS Take 300 mcg by mouth daily.    . carboxymethylcellulose 1 % ophthalmic solution Apply 1 drop to eye 3 (three) times daily. Both eyes    . cetirizine (ZYRTEC) 10 MG tablet Take 10 mg by mouth daily.     . Cholecalciferol (VITAMIN D) 2000 UNITS CAPS Take 1 capsule (2,000 Units total) by mouth daily. 30 capsule 11  . Cranberry 200 MG CAPS Take 2 capsules by mouth 2 (two) times daily.     Marland Kitchen denosumab (PROLIA) 60 MG/ML SOLN injection Inject 60 mg into the skin every 6 (six) months. Administer in upper arm, thigh, or abdomen    . esomeprazole (NEXIUM) 40 MG capsule Take 40 mg by mouth 2 (two) times daily before a meal.     . ferrous sulfate 325 (65 FE) MG tablet Take 325 mg by mouth daily.     .  furosemide (LASIX) 40 MG tablet Take 1 tablet (40 mg total) by mouth daily. 30 tablet 0  . HYDROcodone-acetaminophen (NORCO/VICODIN) 5-325 MG tablet Take 1 tablet by mouth daily. (Patient taking differently: Take 1 tablet by mouth  daily as needed for severe pain. ) 10 tablet 0  . ipratropium (ATROVENT) 0.03 % nasal spray Place 2 sprays into both nostrils daily as needed (For asthma).     . ipratropium-albuterol (DUONEB) 0.5-2.5 (3) MG/3ML SOLN Take 3 mLs by nebulization every 8 (eight) hours as needed (wheezing).     Marland Kitchen levothyroxine (SYNTHROID, LEVOTHROID) 25 MCG tablet Take 1 tablet (25 mcg total) by mouth daily before breakfast.    . lidocaine (LIDODERM) 5 % Place 1 patch onto the skin daily. Remove & Discard patch within 12 hours or as directed by MD 30 patch 0  . LORazepam (ATIVAN) 0.5 MG tablet Take one tablet by mouth every night at bedtime for anxiety 10 tablet 0  . meclizine (ANTIVERT) 12.5 MG tablet Take 12.5 mg by mouth 2 (two) times daily.     . Menthol, Topical Analgesic, (BIOFREEZE) 4 % GEL Apply 1 application topically 2 (two) times daily. For wrist pain     . metoprolol (LOPRESSOR) 50 MG tablet Take 50 mg by mouth 2 (two) times daily.     . mirtazapine (REMERON) 15 MG tablet Take 15 mg by mouth at bedtime.    . montelukast (SINGULAIR) 10 MG tablet Take 10 mg by mouth at bedtime.     . Multiple Vitamins-Minerals (CERTAVITE/ANTIOXIDANTS PO) Take 1 tablet by mouth daily.     . nitrofurantoin, macrocrystal-monohydrate, (MACROBID) 100 MG capsule Take 100 mg by mouth daily.     . nitroGLYCERIN (NITROSTAT) 0.4 MG SL tablet Place 0.4 mg under the tongue every 5 (five) minutes as needed for chest pain.    Marland Kitchen ondansetron (ZOFRAN ODT) 4 MG disintegrating tablet Take 1 tablet (4 mg total) by mouth every 8 (eight) hours as needed for nausea or vomiting. 12 tablet 0  . ondansetron (ZOFRAN) 4 MG tablet Take 4 mg by mouth every 6 (six) hours as needed for nausea or vomiting.    . OXYGEN Inhale 2 L  into the lungs as needed (To maintain saturations >90%).     Marland Kitchen PATADAY 0.2 % SOLN Place 1 drop into both eyes daily.     . potassium chloride SA (K-DUR,KLOR-CON) 20 MEQ tablet Take 1 tablet (20 mEq total) by mouth daily. 30 tablet 0  . predniSONE (DELTASONE) 10 MG tablet Take 10 mg by mouth daily. For RA    . rosuvastatin (CRESTOR) 20 MG tablet Take 20 mg by mouth every evening. For hyperlipidemia    . sertraline (ZOLOFT) 50 MG tablet Take 50 mg by mouth every [name]. For depression     No current facility-administered medications for this visit.     REVIEW OF SYSTEMS:  [X]  denotes positive finding, [ ]  denotes negative finding Cardiac  Comments:  Chest pain or chest pressure:    Shortness of breath upon exertion: x   Short of breath when lying flat: x   Irregular heart rhythm:        Vascular    Pain in calf, thigh, or hip brought on by ambulation:    Pain in feet at night that wakes you up from your sleep:     Blood clot in your veins: x   Leg swelling:         Pulmonary    Oxygen at home:    Productive cough:     Wheezing:         Neurologic    Sudden weakness in arms or legs:  x   Sudden numbness in arms or legs:  x   Sudden onset of difficulty speaking or slurred speech:    Temporary loss of vision in one eye:     Problems with dizziness:         Gastrointestinal    Blood in stool:     Vomited blood:         Genitourinary    Burning when urinating:     Blood in urine:        Psychiatric    Major depression:         Hematologic    Bleeding problems:    Problems with blood clotting too easily:        Skin    Rashes or ulcers:        Constitutional    Fever or chills:     PHYSICAL EXAM:   Vitals:   12/23/17 1002  BP: (!) 179/89  Pulse: 81  Resp: 20  Temp: 97.7 F (36.5 C)  TempSrc: Oral  SpO2: 96%  Weight: 125 lb (56.7 kg)  Height: 5\' 6"  (1.676 m)    GENERAL: The patient is a well-nourished female, in no acute distress. The vital signs are  documented above. CARDIAC: There is a regular rate and rhythm.  VASCULAR: I do not detect carotid bruits. On the right side, which is the site of concern, she has a palpable femoral pulse.  I cannot palpate a popliteal or pedal pulses. On the left side she has a palpable femoral and popliteal pulse.  I could not palpate pedal pulses. She has no significant lower extremity swelling. PULMONARY: There is good air exchange bilaterally without wheezing or rales. ABDOMEN: Soft and non-tender with normal pitched bowel sounds.  MUSCULOSKELETAL: There are no major deformities or cyanosis. NEUROLOGIC: No focal weakness or paresthesias are detected. SKIN: She has a dry wound at the tip of her right great toe.  There is no drainage.  There is mild erythema of the toe. PSYCHIATRIC: The patient has a normal affect.  DATA:    ARTERIAL DOPPLER STUDY: I have independently interpreted her arterial Doppler study today.  On the right side there is a monophasic posterior tibial signal and dorsalis pedis signal.  ABI is 96% however this may be falsely elevated secondary to calcific disease.  Toe pressure on the right is 89 mmHg.  On the left side there is a monophasic posterior tibial signal with a biphasic dorsalis pedis signal.  ABI on the left is 95%.  Again this may be falsely elevated secondary to calcific disease.  Toe pressure on the left is 77 mmHg.  LABS: I reviewed her labs from November 2018.  At that time she had a GFR of 23 with a creatinine of 1.89.  CT ABDOMEN PELVIS: I reviewed her CT of the abdomen and pelvis that was done in September 2018.  This was done without contrast.  However it does show significant calcific disease of her infrarenal aorta and also of the common femoral arteries bilaterally.  She has extensive posterior plaque in both common femoral arteries.  MEDICAL ISSUES:   NONHEALING WOUND RIGHT FOOT WITH PERIPHERAL VASCULAR DISEASE: This patient does have evidence of infrainguinal  arterial occlusive disease on the right.  However, her toe pressure on the right is 89 mmHg which would suggest adequate circulation for healing.  Although the ABIs are likely falsely elevated because of her calcific disease, typically the digital vessels are spared calcific disease and these are more reliable.  Regardless, I do not think she  is a good candidate for intervention given her age and multiple medical comorbidities.  In addition she is nonambulatory and significantly debilitated.  Based on her previous CT scan she has significant calcific disease which also would complicate any endovascular approach.  Regardless the toe is stable.  I have explained that if the toe became worse I think she would likely be able to heal a toe amputation under local anesthesia.  She is very much opposed to even considering this at this time.  I will be happy to see her back at any time if the wound changes.  However at this point I would not recommend a more aggressive work-up.  I did offer her the option of toe amputation which she is not interested in at this time.  HYPERTENSION: The patient's initial blood pressure today was elevated. We repeated this and this was still elevated. We have encouraged the patient to follow up with their primary care physician for management of their blood pressure.   Deitra Mayo [facility] [phone]

## 2017-12-28 DIAGNOSIS — I70235 Atherosclerosis of native arteries of right leg with ulceration of other part of foot: Secondary | ICD-10-CM | POA: Diagnosis not present

## 2017-12-30 DIAGNOSIS — R7989 Other specified abnormal findings of blood chemistry: Secondary | ICD-10-CM | POA: Diagnosis not present

## 2017-12-30 DIAGNOSIS — Z79899 Other long term (current) drug therapy: Secondary | ICD-10-CM | POA: Diagnosis not present

## 2018-01-06 ENCOUNTER — Telehealth: Payer: Self-pay

## 2018-01-06 NOTE — Telephone Encounter (Signed)
Pt appears on [employer] Quality Report for [facility] [employer].  I called the pt and left a message asking for a call back to [phone] confirm their PCP.  VDM (DD) °

## 2018-01-11 DIAGNOSIS — I70235 Atherosclerosis of native arteries of right leg with ulceration of other part of foot: Secondary | ICD-10-CM | POA: Diagnosis not present

## 2018-01-11 DIAGNOSIS — L0390 Cellulitis, unspecified: Secondary | ICD-10-CM | POA: Diagnosis not present

## 2018-01-13 DIAGNOSIS — Z79899 Other long term (current) drug therapy: Secondary | ICD-10-CM | POA: Diagnosis not present

## 2018-01-13 DIAGNOSIS — R7989 Other specified abnormal findings of blood chemistry: Secondary | ICD-10-CM | POA: Diagnosis not present

## 2018-01-14 DIAGNOSIS — N179 Acute kidney failure, unspecified: Secondary | ICD-10-CM | POA: Diagnosis not present

## 2018-01-14 DIAGNOSIS — L03031 Cellulitis of right toe: Secondary | ICD-10-CM | POA: Diagnosis not present

## 2018-01-14 DIAGNOSIS — I5033 Acute on chronic diastolic (congestive) heart failure: Secondary | ICD-10-CM | POA: Diagnosis not present

## 2018-01-14 DIAGNOSIS — R609 Edema, unspecified: Secondary | ICD-10-CM | POA: Diagnosis not present

## 2018-01-18 DIAGNOSIS — L03031 Cellulitis of right toe: Secondary | ICD-10-CM | POA: Diagnosis not present

## 2018-01-18 DIAGNOSIS — L0390 Cellulitis, unspecified: Secondary | ICD-10-CM | POA: Diagnosis not present

## 2018-01-19 DIAGNOSIS — R2681 Unsteadiness on feet: Secondary | ICD-10-CM | POA: Diagnosis not present

## 2018-01-19 DIAGNOSIS — M6281 Muscle weakness (generalized): Secondary | ICD-10-CM | POA: Diagnosis not present

## 2018-01-20 DIAGNOSIS — M6281 Muscle weakness (generalized): Secondary | ICD-10-CM | POA: Diagnosis not present

## 2018-01-20 DIAGNOSIS — R2681 Unsteadiness on feet: Secondary | ICD-10-CM | POA: Diagnosis not present

## 2018-01-21 DIAGNOSIS — R2681 Unsteadiness on feet: Secondary | ICD-10-CM | POA: Diagnosis not present

## 2018-01-21 DIAGNOSIS — M6281 Muscle weakness (generalized): Secondary | ICD-10-CM | POA: Diagnosis not present

## 2018-01-21 DIAGNOSIS — E1151 Type 2 diabetes mellitus with diabetic peripheral angiopathy without gangrene: Secondary | ICD-10-CM | POA: Diagnosis not present

## 2018-01-22 DIAGNOSIS — R2681 Unsteadiness on feet: Secondary | ICD-10-CM | POA: Diagnosis not present

## 2018-01-22 DIAGNOSIS — M6281 Muscle weakness (generalized): Secondary | ICD-10-CM | POA: Diagnosis not present

## 2018-01-25 DIAGNOSIS — R2681 Unsteadiness on feet: Secondary | ICD-10-CM | POA: Diagnosis not present

## 2018-01-25 DIAGNOSIS — M6281 Muscle weakness (generalized): Secondary | ICD-10-CM | POA: Diagnosis not present

## 2018-01-25 DIAGNOSIS — I70235 Atherosclerosis of native arteries of right leg with ulceration of other part of foot: Secondary | ICD-10-CM | POA: Diagnosis not present

## 2018-01-26 DIAGNOSIS — M6281 Muscle weakness (generalized): Secondary | ICD-10-CM | POA: Diagnosis not present

## 2018-01-26 DIAGNOSIS — L0390 Cellulitis, unspecified: Secondary | ICD-10-CM | POA: Diagnosis not present

## 2018-01-26 DIAGNOSIS — R2681 Unsteadiness on feet: Secondary | ICD-10-CM | POA: Diagnosis not present

## 2018-01-27 DIAGNOSIS — R2681 Unsteadiness on feet: Secondary | ICD-10-CM | POA: Diagnosis not present

## 2018-01-27 DIAGNOSIS — M6281 Muscle weakness (generalized): Secondary | ICD-10-CM | POA: Diagnosis not present

## 2018-01-28 DIAGNOSIS — M6281 Muscle weakness (generalized): Secondary | ICD-10-CM | POA: Diagnosis not present

## 2018-01-28 DIAGNOSIS — F411 Generalized anxiety disorder: Secondary | ICD-10-CM | POA: Diagnosis not present

## 2018-01-28 DIAGNOSIS — R2681 Unsteadiness on feet: Secondary | ICD-10-CM | POA: Diagnosis not present

## 2018-01-28 DIAGNOSIS — F329 Major depressive disorder, single episode, unspecified: Secondary | ICD-10-CM | POA: Diagnosis not present

## 2018-01-29 DIAGNOSIS — R2681 Unsteadiness on feet: Secondary | ICD-10-CM | POA: Diagnosis not present

## 2018-01-29 DIAGNOSIS — M6281 Muscle weakness (generalized): Secondary | ICD-10-CM | POA: Diagnosis not present

## 2018-02-01 DIAGNOSIS — M6281 Muscle weakness (generalized): Secondary | ICD-10-CM | POA: Diagnosis not present

## 2018-02-01 DIAGNOSIS — R2681 Unsteadiness on feet: Secondary | ICD-10-CM | POA: Diagnosis not present

## 2018-02-02 DIAGNOSIS — R2681 Unsteadiness on feet: Secondary | ICD-10-CM | POA: Diagnosis not present

## 2018-02-02 DIAGNOSIS — M6281 Muscle weakness (generalized): Secondary | ICD-10-CM | POA: Diagnosis not present

## 2018-02-03 DIAGNOSIS — R2681 Unsteadiness on feet: Secondary | ICD-10-CM | POA: Diagnosis not present

## 2018-02-03 DIAGNOSIS — M6281 Muscle weakness (generalized): Secondary | ICD-10-CM | POA: Diagnosis not present

## 2018-02-04 DIAGNOSIS — M6281 Muscle weakness (generalized): Secondary | ICD-10-CM | POA: Diagnosis not present

## 2018-02-04 DIAGNOSIS — R2681 Unsteadiness on feet: Secondary | ICD-10-CM | POA: Diagnosis not present

## 2018-02-05 DIAGNOSIS — M6281 Muscle weakness (generalized): Secondary | ICD-10-CM | POA: Diagnosis not present

## 2018-02-05 DIAGNOSIS — R2681 Unsteadiness on feet: Secondary | ICD-10-CM | POA: Diagnosis not present

## 2018-02-08 DIAGNOSIS — I70235 Atherosclerosis of native arteries of right leg with ulceration of other part of foot: Secondary | ICD-10-CM | POA: Diagnosis not present

## 2018-02-08 DIAGNOSIS — M6281 Muscle weakness (generalized): Secondary | ICD-10-CM | POA: Diagnosis not present

## 2018-02-08 DIAGNOSIS — R2681 Unsteadiness on feet: Secondary | ICD-10-CM | POA: Diagnosis not present

## 2018-02-09 DIAGNOSIS — M6281 Muscle weakness (generalized): Secondary | ICD-10-CM | POA: Diagnosis not present

## 2018-02-09 DIAGNOSIS — R2681 Unsteadiness on feet: Secondary | ICD-10-CM | POA: Diagnosis not present

## 2018-02-10 DIAGNOSIS — M6281 Muscle weakness (generalized): Secondary | ICD-10-CM | POA: Diagnosis not present

## 2018-02-10 DIAGNOSIS — R2681 Unsteadiness on feet: Secondary | ICD-10-CM | POA: Diagnosis not present

## 2018-02-11 DIAGNOSIS — R2681 Unsteadiness on feet: Secondary | ICD-10-CM | POA: Diagnosis not present

## 2018-02-11 DIAGNOSIS — M6281 Muscle weakness (generalized): Secondary | ICD-10-CM | POA: Diagnosis not present

## 2018-02-12 DIAGNOSIS — M6281 Muscle weakness (generalized): Secondary | ICD-10-CM | POA: Diagnosis not present

## 2018-02-12 DIAGNOSIS — R2681 Unsteadiness on feet: Secondary | ICD-10-CM | POA: Diagnosis not present

## 2018-02-15 DIAGNOSIS — M6281 Muscle weakness (generalized): Secondary | ICD-10-CM | POA: Diagnosis not present

## 2018-02-15 DIAGNOSIS — R2681 Unsteadiness on feet: Secondary | ICD-10-CM | POA: Diagnosis not present

## 2018-02-16 DIAGNOSIS — M6281 Muscle weakness (generalized): Secondary | ICD-10-CM | POA: Diagnosis not present

## 2018-02-16 DIAGNOSIS — R2681 Unsteadiness on feet: Secondary | ICD-10-CM | POA: Diagnosis not present

## 2018-02-17 DIAGNOSIS — R2681 Unsteadiness on feet: Secondary | ICD-10-CM | POA: Diagnosis not present

## 2018-02-17 DIAGNOSIS — M6281 Muscle weakness (generalized): Secondary | ICD-10-CM | POA: Diagnosis not present

## 2018-02-18 DIAGNOSIS — R2681 Unsteadiness on feet: Secondary | ICD-10-CM | POA: Diagnosis not present

## 2018-02-18 DIAGNOSIS — M6281 Muscle weakness (generalized): Secondary | ICD-10-CM | POA: Diagnosis not present

## 2018-02-22 DIAGNOSIS — I70235 Atherosclerosis of native arteries of right leg with ulceration of other part of foot: Secondary | ICD-10-CM | POA: Diagnosis not present

## 2018-03-01 DIAGNOSIS — N390 Urinary tract infection, site not specified: Secondary | ICD-10-CM | POA: Diagnosis not present

## 2018-03-01 DIAGNOSIS — F411 Generalized anxiety disorder: Secondary | ICD-10-CM | POA: Diagnosis not present

## 2018-03-01 DIAGNOSIS — R300 Dysuria: Secondary | ICD-10-CM | POA: Diagnosis not present

## 2018-03-01 DIAGNOSIS — I679 Cerebrovascular disease, unspecified: Secondary | ICD-10-CM | POA: Diagnosis not present

## 2018-03-03 DIAGNOSIS — N219 Calculus of lower urinary tract, unspecified: Secondary | ICD-10-CM | POA: Diagnosis not present

## 2018-03-04 DIAGNOSIS — F339 Major depressive disorder, recurrent, unspecified: Secondary | ICD-10-CM | POA: Diagnosis not present

## 2018-03-04 DIAGNOSIS — F411 Generalized anxiety disorder: Secondary | ICD-10-CM | POA: Diagnosis not present

## 2018-03-04 DIAGNOSIS — E039 Hypothyroidism, unspecified: Secondary | ICD-10-CM | POA: Diagnosis not present

## 2018-03-04 DIAGNOSIS — E559 Vitamin D deficiency, unspecified: Secondary | ICD-10-CM | POA: Diagnosis not present

## 2018-03-08 DIAGNOSIS — M069 Rheumatoid arthritis, unspecified: Secondary | ICD-10-CM | POA: Diagnosis not present

## 2018-03-08 DIAGNOSIS — F339 Major depressive disorder, recurrent, unspecified: Secondary | ICD-10-CM | POA: Diagnosis not present

## 2018-03-08 DIAGNOSIS — I70235 Atherosclerosis of native arteries of right leg with ulceration of other part of foot: Secondary | ICD-10-CM | POA: Diagnosis not present

## 2018-03-08 DIAGNOSIS — I679 Cerebrovascular disease, unspecified: Secondary | ICD-10-CM | POA: Diagnosis not present

## 2018-03-08 DIAGNOSIS — R300 Dysuria: Secondary | ICD-10-CM | POA: Diagnosis not present

## 2018-03-15 DIAGNOSIS — I739 Peripheral vascular disease, unspecified: Secondary | ICD-10-CM | POA: Diagnosis not present

## 2018-03-15 DIAGNOSIS — L84 Corns and callosities: Secondary | ICD-10-CM | POA: Diagnosis not present

## 2018-03-15 DIAGNOSIS — B351 Tinea unguium: Secondary | ICD-10-CM | POA: Diagnosis not present

## 2018-03-22 DIAGNOSIS — I679 Cerebrovascular disease, unspecified: Secondary | ICD-10-CM | POA: Diagnosis not present

## 2018-03-22 DIAGNOSIS — M069 Rheumatoid arthritis, unspecified: Secondary | ICD-10-CM | POA: Diagnosis not present

## 2018-03-22 DIAGNOSIS — R109 Unspecified abdominal pain: Secondary | ICD-10-CM | POA: Diagnosis not present

## 2018-03-22 DIAGNOSIS — R300 Dysuria: Secondary | ICD-10-CM | POA: Diagnosis not present

## 2018-03-23 DIAGNOSIS — M545 Low back pain: Secondary | ICD-10-CM | POA: Diagnosis not present

## 2018-03-23 DIAGNOSIS — R300 Dysuria: Secondary | ICD-10-CM | POA: Diagnosis not present

## 2018-03-23 DIAGNOSIS — N390 Urinary tract infection, site not specified: Secondary | ICD-10-CM | POA: Diagnosis not present

## 2018-03-24 DIAGNOSIS — S32000A Wedge compression fracture of unspecified lumbar vertebra, initial encounter for closed fracture: Secondary | ICD-10-CM | POA: Diagnosis not present

## 2018-03-24 DIAGNOSIS — M545 Low back pain: Secondary | ICD-10-CM | POA: Diagnosis not present

## 2018-03-24 DIAGNOSIS — Z8739 Personal history of other diseases of the musculoskeletal system and connective tissue: Secondary | ICD-10-CM | POA: Diagnosis not present

## 2018-03-29 DIAGNOSIS — I70235 Atherosclerosis of native arteries of right leg with ulceration of other part of foot: Secondary | ICD-10-CM | POA: Diagnosis not present

## 2018-04-01 DIAGNOSIS — F339 Major depressive disorder, recurrent, unspecified: Secondary | ICD-10-CM | POA: Diagnosis not present

## 2018-04-01 DIAGNOSIS — F411 Generalized anxiety disorder: Secondary | ICD-10-CM | POA: Diagnosis not present

## 2018-04-10 DIAGNOSIS — Z23 Encounter for immunization: Secondary | ICD-10-CM | POA: Diagnosis not present

## 2018-04-12 DIAGNOSIS — I70235 Atherosclerosis of native arteries of right leg with ulceration of other part of foot: Secondary | ICD-10-CM | POA: Diagnosis not present

## 2018-04-16 DIAGNOSIS — M6281 Muscle weakness (generalized): Secondary | ICD-10-CM | POA: Diagnosis not present

## 2018-04-16 DIAGNOSIS — M24542 Contracture, left hand: Secondary | ICD-10-CM | POA: Diagnosis not present

## 2018-04-16 DIAGNOSIS — R2681 Unsteadiness on feet: Secondary | ICD-10-CM | POA: Diagnosis not present

## 2018-04-19 DIAGNOSIS — M24542 Contracture, left hand: Secondary | ICD-10-CM | POA: Diagnosis not present

## 2018-04-19 DIAGNOSIS — M6281 Muscle weakness (generalized): Secondary | ICD-10-CM | POA: Diagnosis not present

## 2018-04-19 DIAGNOSIS — R2681 Unsteadiness on feet: Secondary | ICD-10-CM | POA: Diagnosis not present

## 2018-04-20 DIAGNOSIS — R2681 Unsteadiness on feet: Secondary | ICD-10-CM | POA: Diagnosis not present

## 2018-04-20 DIAGNOSIS — M24542 Contracture, left hand: Secondary | ICD-10-CM | POA: Diagnosis not present

## 2018-04-20 DIAGNOSIS — M6281 Muscle weakness (generalized): Secondary | ICD-10-CM | POA: Diagnosis not present

## 2018-04-21 DIAGNOSIS — M6281 Muscle weakness (generalized): Secondary | ICD-10-CM | POA: Diagnosis not present

## 2018-04-21 DIAGNOSIS — M24542 Contracture, left hand: Secondary | ICD-10-CM | POA: Diagnosis not present

## 2018-04-21 DIAGNOSIS — R2681 Unsteadiness on feet: Secondary | ICD-10-CM | POA: Diagnosis not present

## 2018-04-22 DIAGNOSIS — L97519 Non-pressure chronic ulcer of other part of right foot with unspecified severity: Secondary | ICD-10-CM | POA: Diagnosis not present

## 2018-04-22 DIAGNOSIS — M6281 Muscle weakness (generalized): Secondary | ICD-10-CM | POA: Diagnosis not present

## 2018-04-22 DIAGNOSIS — E039 Hypothyroidism, unspecified: Secondary | ICD-10-CM | POA: Diagnosis not present

## 2018-04-22 DIAGNOSIS — I509 Heart failure, unspecified: Secondary | ICD-10-CM | POA: Diagnosis not present

## 2018-04-22 DIAGNOSIS — M24542 Contracture, left hand: Secondary | ICD-10-CM | POA: Diagnosis not present

## 2018-04-22 DIAGNOSIS — R2681 Unsteadiness on feet: Secondary | ICD-10-CM | POA: Diagnosis not present

## 2018-04-22 DIAGNOSIS — I10 Essential (primary) hypertension: Secondary | ICD-10-CM | POA: Diagnosis not present

## 2018-04-23 DIAGNOSIS — M24542 Contracture, left hand: Secondary | ICD-10-CM | POA: Diagnosis not present

## 2018-04-23 DIAGNOSIS — M6281 Muscle weakness (generalized): Secondary | ICD-10-CM | POA: Diagnosis not present

## 2018-04-23 DIAGNOSIS — R2681 Unsteadiness on feet: Secondary | ICD-10-CM | POA: Diagnosis not present

## 2018-04-26 ENCOUNTER — Encounter (INDEPENDENT_AMBULATORY_CARE_PROVIDER_SITE_OTHER): Payer: Self-pay | Admitting: Orthopaedic Surgery

## 2018-04-26 ENCOUNTER — Ambulatory Visit (INDEPENDENT_AMBULATORY_CARE_PROVIDER_SITE_OTHER): Payer: Medicare Other

## 2018-04-26 ENCOUNTER — Ambulatory Visit (INDEPENDENT_AMBULATORY_CARE_PROVIDER_SITE_OTHER): Payer: Medicare Other | Admitting: Orthopaedic Surgery

## 2018-04-26 VITALS — BP 116/50 | HR 78 | Ht 66.0 in | Wt 126.0 lb

## 2018-04-26 DIAGNOSIS — M5441 Lumbago with sciatica, right side: Secondary | ICD-10-CM | POA: Diagnosis not present

## 2018-04-26 DIAGNOSIS — M25512 Pain in left shoulder: Secondary | ICD-10-CM

## 2018-04-26 DIAGNOSIS — M5442 Lumbago with sciatica, left side: Secondary | ICD-10-CM | POA: Diagnosis not present

## 2018-04-26 DIAGNOSIS — M6281 Muscle weakness (generalized): Secondary | ICD-10-CM | POA: Diagnosis not present

## 2018-04-26 DIAGNOSIS — G8929 Other chronic pain: Secondary | ICD-10-CM

## 2018-04-26 DIAGNOSIS — I70299 Other atherosclerosis of native arteries of extremities, unspecified extremity: Secondary | ICD-10-CM

## 2018-04-26 DIAGNOSIS — L97909 Non-pressure chronic ulcer of unspecified part of unspecified lower leg with unspecified severity: Secondary | ICD-10-CM | POA: Diagnosis not present

## 2018-04-26 DIAGNOSIS — M24542 Contracture, left hand: Secondary | ICD-10-CM | POA: Diagnosis not present

## 2018-04-26 DIAGNOSIS — I70235 Atherosclerosis of native arteries of right leg with ulceration of other part of foot: Secondary | ICD-10-CM | POA: Diagnosis not present

## 2018-04-26 DIAGNOSIS — R2681 Unsteadiness on feet: Secondary | ICD-10-CM | POA: Diagnosis not present

## 2018-04-26 MED ORDER — METHYLPREDNISOLONE ACETATE 40 MG/ML IJ SUSP
80.0000 mg | INTRAMUSCULAR | Status: AC | PRN
  Administered 2018-04-26: 80 mg

## 2018-04-26 MED ORDER — BUPIVACAINE HCL 0.5 % IJ SOLN
2.0000 mL | INTRAMUSCULAR | Status: AC | PRN
  Administered 2018-04-26: 2 mL via INTRA_ARTICULAR

## 2018-04-26 MED ORDER — LIDOCAINE HCL 1 % IJ SOLN
2.0000 mL | INTRAMUSCULAR | Status: AC | PRN
  Administered 2018-04-26: 2 mL

## 2018-04-26 NOTE — Progress Notes (Signed)
Office Visit Note   Patient: [name]           Date of Birth: 08-29-1929           MRN: [id number] Visit Date: 04/26/2018              Requested by: Virgel Bouquet, MD [place], [place] [id number] PCP: Patient, No Pcp Per   Assessment & Plan: Visit Diagnoses:  1. Chronic bilateral low back pain with bilateral sciatica   2. Chronic left shoulder pain     Plan: Chronic osteoarthritis left shoulder.  Will inject with cortisone.  Chronic low back pain the presently is asymptomatic.  Films do not demonstrate any significant change from prior films.  Will apply lumbar support when she has the discomfort.  We will plan to see her back as needed  Follow-Up Instructions: Return if symptoms worsen or fail to improve.   Orders:  Orders Placed This Encounter  Procedures  . Large Joint Inj: L glenohumeral  . XR Lumbar Spine 2-3 Views   No orders of the defined types were placed in this encounter.     Procedures: Large Joint Inj: L glenohumeral on 04/26/2018 12:08 PM Details: 25 G 1.5 in needle, anterior approach  Arthrogram: No  Medications: 2 mL lidocaine 1 %; 2 mL bupivacaine 0.5 %; 80 mg methylPREDNISolone acetate 40 MG/ML Procedure, treatment alternatives, risks and benefits explained, specific risks discussed. Consent was given by the patient. Immediately prior to procedure a time out was called to verify the correct patient, procedure, equipment, support staff and site/side marked as required. Patient was prepped and draped in the usual sterile fashion.       Clinical Data: No additional findings.   Subjective: Chief Complaint  Patient presents with  . New Patient (Initial Visit)    back pain for 3 weeks  no injury pt was told had compression fx t12-l1  [name] is 84 years old visited the office evaluation of low back pain and recurrent left shoulder pain.  I have seen her on a number of occasions with evidence of osteoarthritis of her left shoulder.   Years ago she had a cortisone injection that "helped".  She does have some recurrent pain with difficulty raising her arm over her head want to know if she could have a cortisone injection.  In addition she has had some chronic recurrent pain in her lumbar spine.  Many years ago she had extensive instrumentation performed at [facility] from L5 proximally through the thoracic spine.  On occasion she will have some recurrent pain low in her back.  She had some recent films demonstrating what may might be a compression fracture of 2 of the thoracic vertebrae that were unchanged.  She has not had any changes in neurologic status of her lower extremity.  She does have peripheral vascular disease.  HPI  Review of Systems  Constitutional: Positive for fatigue. Negative for fever.  HENT: Negative for ear pain.   Eyes: Negative for pain.  Respiratory: Positive for shortness of breath. Negative for cough.   Cardiovascular: Positive for leg swelling.  Gastrointestinal: Positive for constipation. Negative for diarrhea.  Genitourinary: Negative for difficulty urinating.  Musculoskeletal: Positive for back pain. Negative for neck pain.  Skin: Negative for rash.  Allergic/Immunologic: Negative for food allergies.  Neurological: Positive for weakness and numbness.  Hematological: Bruises/bleeds easily.  Psychiatric/Behavioral: Positive for sleep disturbance.     Objective: Vital Signs: BP (!) 116/50 (BP Location: Left Arm,  Patient Position: Sitting, Cuff Size: Normal)   Pulse 78   Ht 5\' 6"  (1.676 m)   Wt 126 lb (57.2 kg)   LMP  (LMP Unknown)   BMI 20.34 kg/m   Physical Exam  Ortho Exam awake alert and oriented x3.  Comfortable sitting.  Evaluated in a wheelchair.  Very pleasant.  Pupils equally round.  No shortness of breath or chest pain.  She does ambulate but not "very often" she does have bilateral hamstring tightness ability to fully extend either knee.  No swelling distally.  Has some altered  sensibility in both feet related to her peripheral vascular disease.  Does not have any percussible tenderness of the thoracic or upper lumbar spine.  Very minimal discomfort at the lumbosacral junction.  Painless range of motion both hips.  Specialty Comments:  No specialty comments available.  Imaging: Xr Lumbar Spine 2-3 Views  Result Date: 04/26/2018 Films of the lumbar spine were obtained in several projections.  There is instrumentation from L5 through the proximal thoracic spine.  Surgery performed at [facility] many years ago.  There appears to be a chronic compression of T8 and T9 by my count.  Reviewing other films are much of a change.  There is considerable degenerative disc disease at L5-S1    PMFS History: Patient Active Problem List   Diagnosis Date Noted  . Diarrhea   . AKI (acute kidney injury) ([place]) 05/24/2017  . Dehydration 05/24/2017  . Chronic nausea 05/24/2017  . Hyperglycemia 11/14/2016  . History of DVT (deep vein thrombosis) 11/13/2016  . Sepsis ([place]) 09/15/2016  . Wrist swelling, left 09/15/2016  . Acute encephalopathy 09/15/2016  . Pain and swelling of left wrist   . UTI (urinary tract infection) 04/08/2016  . Benign paroxysmal positional vertigo 12/19/2015  . Easy bruising 09/20/2015  . CKD (chronic kidney disease) stage 3, GFR 30-59 ml/min (HCC) 07/27/2015  . Anemia of chronic disease 07/27/2015  . Pulmonary hypertension ([place])   . Generalized osteoarthritis 04/11/2015  . Thyroid activity decreased 04/11/2015  . Coronary artery disease   . Obstructive hypertrophic cardiomyopathy ([place])   . Chronic diastolic CHF (congestive heart failure) ([place])   . HCAP (healthcare-associated pneumonia) 11/24/2014  . Vertigo 05/17/2014  . Hemorrhoids 04/24/2014  . Hypothyroidism 12/16/2013  . HLD (hyperlipidemia) 12/16/2013  . Benign hypertensive heart and kidney disease with diastolic CHF, NYHA class II and CKD stage III ([place]) 12/16/2013  . Allergic rhinitis 07/05/2013    . Acute kidney injury superimposed on CKD ([place]) 01/22/2013  . Adrenal insufficiency ([place]) 12/08/2011    Class: Chronic  . History of CVA (cerebrovascular accident) 12/07/2011    Class: Acute  . Dysphagia 12/07/2011    Class: Acute  . MI (myocardial infarction) ([place])   . RA (rheumatoid arthritis) ([place])   . PUD (peptic ulcer disease)   . Hiatal hernia with gastroesophageal reflux   . Hiatal hernia   . Osteoporosis   . Depression   . Anxiety    Past Medical History:  Diagnosis Date  . Anxiety   . Arthritis   . Chronic diastolic CHF (congestive heart failure) ([place])    a. 10/2014 Echo: EF 60-65%, mild LVH, grade 1 DD, dynamic obstruction @ rest - peak velocity of 271 cm/sec, peak grad of 54mmHg, PASP 75mmHg.  Marland Kitchen Coronary artery disease    a. 2012 s/p NSTEMI/Cath: 95% apical LAD dzs, otw nonobs dzs-->Med Rx;  b. 05/2011 MV: no ischemia, EF 79%, inf infarct- attenuation.  . CVA (cerebral vascular  accident) ([place])   . Depression   . DVT of lower extremity (deep venous thrombosis) ([place])   . Dyspnea   . Elevated troponin    a. 10/2014 in setting of sepsis/?HCAP.  Marland Kitchen Enteritis due to Clostridium difficile 01/04/2013  . Essential hypertension   . Fall   . Fatigue   . GERD (gastroesophageal reflux disease)   . GI bleed 2005  . HCAP (healthcare-associated pneumonia) 01/22/2013  . Hemorrhoids   . Hiatal hernia   . Hyperlipidemia   . Hypothyroidism   . Malnutrition ([place])   . Obstructive hypertrophic cardiomyopathy ([place])    a. 10/2014 Echo: EF 60-65%, mild LVH, grade 1 DD, dynamic obstruction @ rest - peak velocity of 271 cm/sec, peak grad of 59mmHg, PASP 32mmHg.  . Osteoporosis   . PUD (peptic ulcer disease)   . RA (rheumatoid arthritis) ([place])   . Recurrent colitis due to Clostridium difficile 06/02/2012  . Renal insufficiency   . Right ear pain   . Sepsis ([place]) 12/19/2012   UTIs  . TIA (transient ischemic attack)   . Weight loss     Family History  Problem Relation Age of Onset  .  Kidney disease Mother   . Kidney disease Brother   . Lung cancer Father     Past Surgical History:  Procedure Laterality Date  . ANKLE SURGERY    . BACK SURGERY    . [place]   x3  . CARDIAC CATHETERIZATION     SHOWED RUPTURE PLAQUE IN THE LAD. THE LAD IS NONOBSTRUCTIVE WITH ONLY 30-40% STENOSIS  . COLONOSCOPY    . ESOPHAGOGASTRODUODENOSCOPY (EGD) WITH PROPOFOL N/A 05/26/2017   Procedure: ESOPHAGOGASTRODUODENOSCOPY (EGD) WITH PROPOFOL;  Surgeon: Lucilla Lame, MD;  Location[facility] ENDOSCOPY;  Service: Endoscopy;  Laterality: N/A;  . KNEE SURGERY    . NSTEMI  06/2010  . SPINAL FUSION SURGERY     Social History   Occupational History  . Occupation: retired Orthoptist  Tobacco Use  . Smoking status: Never Smoker  . Smokeless tobacco: Never Used  Substance and Sexual Activity  . Alcohol use: No  . Drug use: No  . Sexual activity: Never    Birth control/protection: Post-menopausal

## 2018-04-27 DIAGNOSIS — M6281 Muscle weakness (generalized): Secondary | ICD-10-CM | POA: Diagnosis not present

## 2018-04-27 DIAGNOSIS — R2681 Unsteadiness on feet: Secondary | ICD-10-CM | POA: Diagnosis not present

## 2018-04-27 DIAGNOSIS — M24542 Contracture, left hand: Secondary | ICD-10-CM | POA: Diagnosis not present

## 2018-04-28 DIAGNOSIS — M6281 Muscle weakness (generalized): Secondary | ICD-10-CM | POA: Diagnosis not present

## 2018-04-28 DIAGNOSIS — R2681 Unsteadiness on feet: Secondary | ICD-10-CM | POA: Diagnosis not present

## 2018-04-28 DIAGNOSIS — M24542 Contracture, left hand: Secondary | ICD-10-CM | POA: Diagnosis not present

## 2018-04-29 DIAGNOSIS — M24542 Contracture, left hand: Secondary | ICD-10-CM | POA: Diagnosis not present

## 2018-04-29 DIAGNOSIS — R2681 Unsteadiness on feet: Secondary | ICD-10-CM | POA: Diagnosis not present

## 2018-04-29 DIAGNOSIS — M6281 Muscle weakness (generalized): Secondary | ICD-10-CM | POA: Diagnosis not present

## 2018-04-30 DIAGNOSIS — R2689 Other abnormalities of gait and mobility: Secondary | ICD-10-CM | POA: Diagnosis not present

## 2018-04-30 DIAGNOSIS — M24542 Contracture, left hand: Secondary | ICD-10-CM | POA: Diagnosis not present

## 2018-04-30 DIAGNOSIS — M6281 Muscle weakness (generalized): Secondary | ICD-10-CM | POA: Diagnosis not present

## 2018-05-03 DIAGNOSIS — M24542 Contracture, left hand: Secondary | ICD-10-CM | POA: Diagnosis not present

## 2018-05-03 DIAGNOSIS — M6281 Muscle weakness (generalized): Secondary | ICD-10-CM | POA: Diagnosis not present

## 2018-05-03 DIAGNOSIS — R2689 Other abnormalities of gait and mobility: Secondary | ICD-10-CM | POA: Diagnosis not present

## 2018-05-04 DIAGNOSIS — R2689 Other abnormalities of gait and mobility: Secondary | ICD-10-CM | POA: Diagnosis not present

## 2018-05-04 DIAGNOSIS — M24542 Contracture, left hand: Secondary | ICD-10-CM | POA: Diagnosis not present

## 2018-05-04 DIAGNOSIS — F339 Major depressive disorder, recurrent, unspecified: Secondary | ICD-10-CM | POA: Diagnosis not present

## 2018-05-04 DIAGNOSIS — M6281 Muscle weakness (generalized): Secondary | ICD-10-CM | POA: Diagnosis not present

## 2018-05-04 DIAGNOSIS — F411 Generalized anxiety disorder: Secondary | ICD-10-CM | POA: Diagnosis not present

## 2018-05-05 DIAGNOSIS — M24542 Contracture, left hand: Secondary | ICD-10-CM | POA: Diagnosis not present

## 2018-05-05 DIAGNOSIS — M6281 Muscle weakness (generalized): Secondary | ICD-10-CM | POA: Diagnosis not present

## 2018-05-05 DIAGNOSIS — R2689 Other abnormalities of gait and mobility: Secondary | ICD-10-CM | POA: Diagnosis not present

## 2018-05-06 DIAGNOSIS — M24542 Contracture, left hand: Secondary | ICD-10-CM | POA: Diagnosis not present

## 2018-05-06 DIAGNOSIS — M6281 Muscle weakness (generalized): Secondary | ICD-10-CM | POA: Diagnosis not present

## 2018-05-06 DIAGNOSIS — R2689 Other abnormalities of gait and mobility: Secondary | ICD-10-CM | POA: Diagnosis not present

## 2018-05-07 DIAGNOSIS — M24542 Contracture, left hand: Secondary | ICD-10-CM | POA: Diagnosis not present

## 2018-05-07 DIAGNOSIS — M6281 Muscle weakness (generalized): Secondary | ICD-10-CM | POA: Diagnosis not present

## 2018-05-07 DIAGNOSIS — R2689 Other abnormalities of gait and mobility: Secondary | ICD-10-CM | POA: Diagnosis not present

## 2018-05-10 DIAGNOSIS — R2689 Other abnormalities of gait and mobility: Secondary | ICD-10-CM | POA: Diagnosis not present

## 2018-05-10 DIAGNOSIS — I70235 Atherosclerosis of native arteries of right leg with ulceration of other part of foot: Secondary | ICD-10-CM | POA: Diagnosis not present

## 2018-05-10 DIAGNOSIS — M6281 Muscle weakness (generalized): Secondary | ICD-10-CM | POA: Diagnosis not present

## 2018-05-10 DIAGNOSIS — M24542 Contracture, left hand: Secondary | ICD-10-CM | POA: Diagnosis not present

## 2018-05-11 DIAGNOSIS — M24542 Contracture, left hand: Secondary | ICD-10-CM | POA: Diagnosis not present

## 2018-05-11 DIAGNOSIS — R2689 Other abnormalities of gait and mobility: Secondary | ICD-10-CM | POA: Diagnosis not present

## 2018-05-11 DIAGNOSIS — M6281 Muscle weakness (generalized): Secondary | ICD-10-CM | POA: Diagnosis not present

## 2018-05-12 DIAGNOSIS — M24542 Contracture, left hand: Secondary | ICD-10-CM | POA: Diagnosis not present

## 2018-05-12 DIAGNOSIS — R2689 Other abnormalities of gait and mobility: Secondary | ICD-10-CM | POA: Diagnosis not present

## 2018-05-12 DIAGNOSIS — M6281 Muscle weakness (generalized): Secondary | ICD-10-CM | POA: Diagnosis not present

## 2018-05-13 DIAGNOSIS — R2689 Other abnormalities of gait and mobility: Secondary | ICD-10-CM | POA: Diagnosis not present

## 2018-05-13 DIAGNOSIS — M6281 Muscle weakness (generalized): Secondary | ICD-10-CM | POA: Diagnosis not present

## 2018-05-13 DIAGNOSIS — M24542 Contracture, left hand: Secondary | ICD-10-CM | POA: Diagnosis not present

## 2018-05-24 DIAGNOSIS — I70235 Atherosclerosis of native arteries of right leg with ulceration of other part of foot: Secondary | ICD-10-CM | POA: Diagnosis not present

## 2018-06-01 DIAGNOSIS — M25511 Pain in right shoulder: Secondary | ICD-10-CM | POA: Diagnosis not present

## 2018-06-01 DIAGNOSIS — M545 Low back pain: Secondary | ICD-10-CM | POA: Diagnosis not present

## 2018-06-01 DIAGNOSIS — S32000A Wedge compression fracture of unspecified lumbar vertebra, initial encounter for closed fracture: Secondary | ICD-10-CM | POA: Diagnosis not present

## 2018-06-02 DIAGNOSIS — M25511 Pain in right shoulder: Secondary | ICD-10-CM | POA: Diagnosis not present

## 2018-06-02 DIAGNOSIS — M25512 Pain in left shoulder: Secondary | ICD-10-CM | POA: Diagnosis not present

## 2018-06-03 DIAGNOSIS — M545 Low back pain: Secondary | ICD-10-CM | POA: Diagnosis not present

## 2018-06-03 DIAGNOSIS — S32000A Wedge compression fracture of unspecified lumbar vertebra, initial encounter for closed fracture: Secondary | ICD-10-CM | POA: Diagnosis not present

## 2018-06-03 DIAGNOSIS — M25511 Pain in right shoulder: Secondary | ICD-10-CM | POA: Diagnosis not present

## 2018-06-03 DIAGNOSIS — Z8739 Personal history of other diseases of the musculoskeletal system and connective tissue: Secondary | ICD-10-CM | POA: Diagnosis not present

## 2018-06-04 DIAGNOSIS — D509 Iron deficiency anemia, unspecified: Secondary | ICD-10-CM | POA: Diagnosis not present

## 2018-06-07 DIAGNOSIS — I70235 Atherosclerosis of native arteries of right leg with ulceration of other part of foot: Secondary | ICD-10-CM | POA: Diagnosis not present

## 2018-06-08 DIAGNOSIS — M545 Low back pain: Secondary | ICD-10-CM | POA: Diagnosis not present

## 2018-06-08 DIAGNOSIS — F329 Major depressive disorder, single episode, unspecified: Secondary | ICD-10-CM | POA: Diagnosis not present

## 2018-06-08 DIAGNOSIS — N390 Urinary tract infection, site not specified: Secondary | ICD-10-CM | POA: Diagnosis not present

## 2018-06-08 DIAGNOSIS — F411 Generalized anxiety disorder: Secondary | ICD-10-CM | POA: Diagnosis not present

## 2018-06-08 DIAGNOSIS — R062 Wheezing: Secondary | ICD-10-CM | POA: Diagnosis not present

## 2018-06-08 DIAGNOSIS — M25511 Pain in right shoulder: Secondary | ICD-10-CM | POA: Diagnosis not present

## 2018-06-08 DIAGNOSIS — R4189 Other symptoms and signs involving cognitive functions and awareness: Secondary | ICD-10-CM | POA: Diagnosis not present

## 2018-06-09 DIAGNOSIS — M25511 Pain in right shoulder: Secondary | ICD-10-CM | POA: Diagnosis not present

## 2018-06-09 DIAGNOSIS — R9389 Abnormal findings on diagnostic imaging of other specified body structures: Secondary | ICD-10-CM | POA: Diagnosis not present

## 2018-06-09 DIAGNOSIS — N390 Urinary tract infection, site not specified: Secondary | ICD-10-CM | POA: Diagnosis not present

## 2018-06-09 DIAGNOSIS — M545 Low back pain: Secondary | ICD-10-CM | POA: Diagnosis not present

## 2018-06-11 ENCOUNTER — Inpatient Hospital Stay (HOSPITAL_COMMUNITY)
Admission: EM | Admit: 2018-06-11 | Discharge: 2018-06-15 | DRG: 871 | Disposition: A | Discharge status: Skilled Nursing Facility | Payer: Medicare Other | Attending: Internal Medicine | Admitting: Internal Medicine

## 2018-06-11 ENCOUNTER — Other Ambulatory Visit: Payer: Self-pay

## 2018-06-11 ENCOUNTER — Encounter (HOSPITAL_COMMUNITY): Payer: Self-pay | Admitting: Internal Medicine

## 2018-06-11 ENCOUNTER — Emergency Department (HOSPITAL_COMMUNITY): Payer: Medicare Other

## 2018-06-11 DIAGNOSIS — M6281 Muscle weakness (generalized): Secondary | ICD-10-CM | POA: Diagnosis not present

## 2018-06-11 DIAGNOSIS — J189 Pneumonia, unspecified organism: Secondary | ICD-10-CM | POA: Diagnosis present

## 2018-06-11 DIAGNOSIS — Z791 Long term (current) use of non-steroidal anti-inflammatories (NSAID): Secondary | ICD-10-CM

## 2018-06-11 DIAGNOSIS — I421 Obstructive hypertrophic cardiomyopathy: Secondary | ICD-10-CM | POA: Diagnosis present

## 2018-06-11 DIAGNOSIS — M25511 Pain in right shoulder: Secondary | ICD-10-CM | POA: Diagnosis not present

## 2018-06-11 DIAGNOSIS — F329 Major depressive disorder, single episode, unspecified: Secondary | ICD-10-CM | POA: Diagnosis present

## 2018-06-11 DIAGNOSIS — I509 Heart failure, unspecified: Secondary | ICD-10-CM | POA: Diagnosis not present

## 2018-06-11 DIAGNOSIS — A419 Sepsis, unspecified organism: Principal | ICD-10-CM | POA: Diagnosis present

## 2018-06-11 DIAGNOSIS — Z86718 Personal history of other venous thrombosis and embolism: Secondary | ICD-10-CM

## 2018-06-11 DIAGNOSIS — F32A Depression, unspecified: Secondary | ICD-10-CM | POA: Diagnosis present

## 2018-06-11 DIAGNOSIS — Z79899 Other long term (current) drug therapy: Secondary | ICD-10-CM | POA: Diagnosis not present

## 2018-06-11 DIAGNOSIS — N390 Urinary tract infection, site not specified: Secondary | ICD-10-CM | POA: Diagnosis present

## 2018-06-11 DIAGNOSIS — N189 Chronic kidney disease, unspecified: Secondary | ICD-10-CM

## 2018-06-11 DIAGNOSIS — F0390 Unspecified dementia without behavioral disturbance: Secondary | ICD-10-CM | POA: Diagnosis present

## 2018-06-11 DIAGNOSIS — Z743 Need for continuous supervision: Secondary | ICD-10-CM | POA: Diagnosis not present

## 2018-06-11 DIAGNOSIS — R05 Cough: Secondary | ICD-10-CM | POA: Diagnosis not present

## 2018-06-11 DIAGNOSIS — I5032 Chronic diastolic (congestive) heart failure: Secondary | ICD-10-CM | POA: Diagnosis present

## 2018-06-11 DIAGNOSIS — Z7951 Long term (current) use of inhaled steroids: Secondary | ICD-10-CM

## 2018-06-11 DIAGNOSIS — Z7901 Long term (current) use of anticoagulants: Secondary | ICD-10-CM

## 2018-06-11 DIAGNOSIS — Z7952 Long term (current) use of systemic steroids: Secondary | ICD-10-CM

## 2018-06-11 DIAGNOSIS — R0602 Shortness of breath: Secondary | ICD-10-CM | POA: Diagnosis not present

## 2018-06-11 DIAGNOSIS — R2689 Other abnormalities of gait and mobility: Secondary | ICD-10-CM | POA: Diagnosis not present

## 2018-06-11 DIAGNOSIS — R0902 Hypoxemia: Secondary | ICD-10-CM | POA: Diagnosis not present

## 2018-06-11 DIAGNOSIS — I252 Old myocardial infarction: Secondary | ICD-10-CM

## 2018-06-11 DIAGNOSIS — J9601 Acute respiratory failure with hypoxia: Secondary | ICD-10-CM | POA: Diagnosis not present

## 2018-06-11 DIAGNOSIS — I2510 Atherosclerotic heart disease of native coronary artery without angina pectoris: Secondary | ICD-10-CM | POA: Diagnosis present

## 2018-06-11 DIAGNOSIS — E785 Hyperlipidemia, unspecified: Secondary | ICD-10-CM | POA: Diagnosis present

## 2018-06-11 DIAGNOSIS — N184 Chronic kidney disease, stage 4 (severe): Secondary | ICD-10-CM | POA: Diagnosis present

## 2018-06-11 DIAGNOSIS — J9600 Acute respiratory failure, unspecified whether with hypoxia or hypercapnia: Secondary | ICD-10-CM

## 2018-06-11 DIAGNOSIS — Y95 Nosocomial condition: Secondary | ICD-10-CM | POA: Diagnosis present

## 2018-06-11 DIAGNOSIS — R41841 Cognitive communication deficit: Secondary | ICD-10-CM | POA: Diagnosis not present

## 2018-06-11 DIAGNOSIS — Z8673 Personal history of transient ischemic attack (TIA), and cerebral infarction without residual deficits: Secondary | ICD-10-CM

## 2018-06-11 DIAGNOSIS — Z955 Presence of coronary angioplasty implant and graft: Secondary | ICD-10-CM

## 2018-06-11 DIAGNOSIS — K219 Gastro-esophageal reflux disease without esophagitis: Secondary | ICD-10-CM | POA: Diagnosis present

## 2018-06-11 DIAGNOSIS — E872 Acidosis: Secondary | ICD-10-CM | POA: Diagnosis present

## 2018-06-11 DIAGNOSIS — E039 Hypothyroidism, unspecified: Secondary | ICD-10-CM | POA: Diagnosis present

## 2018-06-11 DIAGNOSIS — I10 Essential (primary) hypertension: Secondary | ICD-10-CM | POA: Diagnosis not present

## 2018-06-11 DIAGNOSIS — Z9981 Dependence on supplemental oxygen: Secondary | ICD-10-CM

## 2018-06-11 DIAGNOSIS — N179 Acute kidney failure, unspecified: Secondary | ICD-10-CM | POA: Diagnosis present

## 2018-06-11 DIAGNOSIS — D638 Anemia in other chronic diseases classified elsewhere: Secondary | ICD-10-CM | POA: Diagnosis present

## 2018-06-11 DIAGNOSIS — H409 Unspecified glaucoma: Secondary | ICD-10-CM | POA: Diagnosis present

## 2018-06-11 DIAGNOSIS — Z79890 Hormone replacement therapy (postmenopausal): Secondary | ICD-10-CM

## 2018-06-11 DIAGNOSIS — R278 Other lack of coordination: Secondary | ICD-10-CM | POA: Diagnosis not present

## 2018-06-11 DIAGNOSIS — D696 Thrombocytopenia, unspecified: Secondary | ICD-10-CM | POA: Diagnosis present

## 2018-06-11 DIAGNOSIS — J99 Respiratory disorders in diseases classified elsewhere: Secondary | ICD-10-CM | POA: Diagnosis not present

## 2018-06-11 DIAGNOSIS — R1312 Dysphagia, oropharyngeal phase: Secondary | ICD-10-CM | POA: Diagnosis not present

## 2018-06-11 DIAGNOSIS — E440 Moderate protein-calorie malnutrition: Secondary | ICD-10-CM | POA: Diagnosis not present

## 2018-06-11 DIAGNOSIS — F419 Anxiety disorder, unspecified: Secondary | ICD-10-CM | POA: Diagnosis not present

## 2018-06-11 DIAGNOSIS — J9690 Respiratory failure, unspecified, unspecified whether with hypoxia or hypercapnia: Secondary | ICD-10-CM | POA: Diagnosis not present

## 2018-06-11 DIAGNOSIS — M069 Rheumatoid arthritis, unspecified: Secondary | ICD-10-CM | POA: Diagnosis present

## 2018-06-11 DIAGNOSIS — I130 Hypertensive heart and chronic kidney disease with heart failure and stage 1 through stage 4 chronic kidney disease, or unspecified chronic kidney disease: Secondary | ICD-10-CM | POA: Diagnosis present

## 2018-06-11 DIAGNOSIS — M810 Age-related osteoporosis without current pathological fracture: Secondary | ICD-10-CM | POA: Diagnosis present

## 2018-06-11 DIAGNOSIS — E782 Mixed hyperlipidemia: Secondary | ICD-10-CM | POA: Diagnosis not present

## 2018-06-11 DIAGNOSIS — A310 Pulmonary mycobacterial infection: Secondary | ICD-10-CM | POA: Diagnosis not present

## 2018-06-11 DIAGNOSIS — R279 Unspecified lack of coordination: Secondary | ICD-10-CM | POA: Diagnosis not present

## 2018-06-11 DIAGNOSIS — R0603 Acute respiratory distress: Secondary | ICD-10-CM | POA: Diagnosis not present

## 2018-06-11 LAB — COMPREHENSIVE METABOLIC PANEL
ALT: 22 U/L (ref 0–44)
AST: 23 U/L (ref 15–41)
Albumin: 2.8 g/dL — ABNORMAL LOW (ref 3.5–5.0)
Alkaline Phosphatase: 57 U/L (ref 38–126)
Anion gap: 12 (ref 5–15)
BUN: 30 mg/dL — ABNORMAL HIGH (ref 8–23)
CO2: 20 mmol/L — ABNORMAL LOW (ref 22–32)
Calcium: 8.4 mg/dL — ABNORMAL LOW (ref 8.9–10.3)
Chloride: 111 mmol/L (ref 98–111)
Creatinine, Ser: 2.42 mg/dL — ABNORMAL HIGH (ref 0.44–1.00)
GFR calc Af Amer: 20 mL/min — ABNORMAL LOW (ref >60)
GFR, EST NON AFRICAN AMERICAN: 18 mL/min — AB (ref >60)
Glucose, Bld: 174 mg/dL — ABNORMAL HIGH (ref 70–99)
Potassium: 4.8 mmol/L (ref 3.5–5.1)
Sodium: 143 mmol/L (ref 135–145)
Total Bilirubin: 0.6 mg/dL (ref 0.3–1.2)
Total Protein: 5.6 g/dL — ABNORMAL LOW (ref 6.5–8.1)

## 2018-06-11 LAB — CBC WITH DIFFERENTIAL/PLATELET
Abs Immature Granulocytes: 0.12 10*3/uL — ABNORMAL HIGH (ref 0.00–0.07)
BASOS ABS: 0.1 10*3/uL (ref 0.0–0.1)
Basophils Relative: 0 %
Eosinophils Absolute: 0.2 10*3/uL (ref 0.0–0.5)
Eosinophils Relative: 2 %
HCT: 29.4 % — ABNORMAL LOW (ref 36.0–46.0)
Hemoglobin: 8.6 g/dL — ABNORMAL LOW (ref 12.0–15.0)
Immature Granulocytes: 1 %
Lymphocytes Relative: 5 %
Lymphs Abs: 0.8 10*3/uL (ref 0.7–4.0)
MCH: 26.5 pg (ref 26.0–34.0)
MCHC: 29.3 g/dL — ABNORMAL LOW (ref 30.0–36.0)
MCV: 90.5 fL (ref 80.0–100.0)
Monocytes Absolute: 1.3 10*3/uL — ABNORMAL HIGH (ref 0.1–1.0)
Monocytes Relative: 9 %
NRBC: 0 % (ref 0.0–0.2)
Neutro Abs: 11.8 10*3/uL — ABNORMAL HIGH (ref 1.7–7.7)
Neutrophils Relative %: 83 %
Platelets: 137 10*3/uL — ABNORMAL LOW (ref 150–400)
RBC: 3.25 MIL/uL — ABNORMAL LOW (ref 3.87–5.11)
RDW: 17.6 % — ABNORMAL HIGH (ref 11.5–15.5)
WBC: 14.2 10*3/uL — AB (ref 4.0–10.5)

## 2018-06-11 LAB — BRAIN NATRIURETIC PEPTIDE: B Natriuretic Peptide: 1300.6 pg/mL — ABNORMAL HIGH (ref 0.0–100.0)

## 2018-06-11 LAB — URINALYSIS, ROUTINE W REFLEX MICROSCOPIC
Bilirubin Urine: NEGATIVE
Glucose, UA: NEGATIVE mg/dL
Ketones, ur: NEGATIVE mg/dL
Nitrite: NEGATIVE
PH: 7 (ref 5.0–8.0)
Protein, ur: NEGATIVE mg/dL
Specific Gravity, Urine: 1.008 (ref 1.005–1.030)

## 2018-06-11 LAB — I-STAT CG4 LACTIC ACID, ED
Lactic Acid, Venous: 2.06 mmol/L (ref 0.5–1.9)
Lactic Acid, Venous: 1.85 mmol/L (ref 0.5–1.9)

## 2018-06-11 LAB — I-STAT TROPONIN, ED: Troponin i, poc: 0.03 ng/mL (ref 0.00–0.08)

## 2018-06-11 LAB — MRSA PCR SCREENING: MRSA by PCR: NEGATIVE

## 2018-06-11 MED ORDER — SERTRALINE HCL 50 MG PO TABS
50.0000 mg | ORAL_TABLET | Freq: Every morning | ORAL | Status: DC
  Administered 2018-06-12 – 2018-06-15 (×4): 50 mg via ORAL
  Filled 2018-06-11 (×4): qty 1

## 2018-06-11 MED ORDER — ACETAMINOPHEN 650 MG RE SUPP: 650.0000 mg | Freq: Four times a day (QID) | RECTAL | Status: DC | PRN

## 2018-06-11 MED ORDER — METOPROLOL TARTRATE 50 MG PO TABS
75.0000 mg | ORAL_TABLET | Freq: Two times a day (BID) | ORAL | Status: DC
  Administered 2018-06-11 – 2018-06-12 (×2): 75 mg via ORAL
  Administered 2018-06-12: 50 mg via ORAL
  Administered 2018-06-13 – 2018-06-15 (×5): 75 mg via ORAL
  Filled 2018-06-11 (×9): qty 1

## 2018-06-11 MED ORDER — PREDNISONE 10 MG PO TABS
10.0000 mg | ORAL_TABLET | Freq: Every day | ORAL | Status: DC
  Administered 2018-06-11 – 2018-06-15 (×5): 10 mg via ORAL
  Filled 2018-06-11 (×5): qty 1

## 2018-06-11 MED ORDER — CARBOXYMETH-GLYCERIN-POLYSORB 0.5-1-0.5 % OP SOLN: 1.0000 [drp] | Freq: Four times a day (QID) | OPHTHALMIC | Status: DC

## 2018-06-11 MED ORDER — SODIUM CHLORIDE 0.9 % IV SOLN
1.0000 g | INTRAVENOUS | Status: DC
  Administered 2018-06-12 – 2018-06-15 (×4): 1 g via INTRAVENOUS
  Filled 2018-06-11 (×4): qty 1

## 2018-06-11 MED ORDER — ONDANSETRON HCL 4 MG/2ML IJ SOLN: 4.0000 mg | Freq: Four times a day (QID) | INTRAMUSCULAR | Status: DC | PRN

## 2018-06-11 MED ORDER — ACETAMINOPHEN 325 MG PO TABS: 650.0000 mg | ORAL_TABLET | Freq: Four times a day (QID) | ORAL | Status: DC | PRN

## 2018-06-11 MED ORDER — MIRTAZAPINE 15 MG PO TABS
15.0000 mg | ORAL_TABLET | Freq: Every day | ORAL | Status: DC
  Administered 2018-06-11 – 2018-06-14 (×4): 15 mg via ORAL
  Filled 2018-06-11 (×5): qty 1

## 2018-06-11 MED ORDER — LORATADINE 10 MG PO TABS
10.0000 mg | ORAL_TABLET | Freq: Every day | ORAL | Status: DC
  Administered 2018-06-11 – 2018-06-15 (×4): 10 mg via ORAL
  Filled 2018-06-11 (×5): qty 1

## 2018-06-11 MED ORDER — CLONIDINE HCL 0.1 MG PO TABS: 0.1000 mg | ORAL_TABLET | Freq: Three times a day (TID) | ORAL | Status: DC | PRN

## 2018-06-11 MED ORDER — LEVOTHYROXINE SODIUM 25 MCG PO TABS
25.0000 ug | ORAL_TABLET | Freq: Every day | ORAL | Status: DC
  Administered 2018-06-12 – 2018-06-14 (×3): 25 ug via ORAL
  Filled 2018-06-11 (×3): qty 1

## 2018-06-11 MED ORDER — POTASSIUM CHLORIDE CRYS ER 20 MEQ PO TBCR
20.0000 meq | EXTENDED_RELEASE_TABLET | Freq: Every day | ORAL | Status: DC
  Administered 2018-06-11 – 2018-06-12 (×2): 20 meq via ORAL
  Filled 2018-06-11 (×2): qty 1

## 2018-06-11 MED ORDER — IPRATROPIUM-ALBUTEROL 0.5-2.5 (3) MG/3ML IN SOLN
3.0000 mL | Freq: Three times a day (TID) | RESPIRATORY_TRACT | Status: DC
  Administered 2018-06-11: 3 mL via RESPIRATORY_TRACT
  Filled 2018-06-11 (×2): qty 3

## 2018-06-11 MED ORDER — VANCOMYCIN HCL IN DEXTROSE 1-5 GM/200ML-% IV SOLN
1000.0000 mg | Freq: Once | INTRAVENOUS | Status: AC
  Administered 2018-06-11: 1000 mg via INTRAVENOUS
  Filled 2018-06-11: qty 200

## 2018-06-11 MED ORDER — SODIUM CHLORIDE 0.9 % IV SOLN
2.0000 g | Freq: Once | INTRAVENOUS | Status: AC
  Administered 2018-06-11: 2 g via INTRAVENOUS
  Filled 2018-06-11: qty 2

## 2018-06-11 MED ORDER — HYDROCODONE-ACETAMINOPHEN 5-325 MG PO TABS: 1.0000 | ORAL_TABLET | Freq: Four times a day (QID) | ORAL | Status: DC | PRN

## 2018-06-11 MED ORDER — SODIUM CHLORIDE 0.45 % IV SOLN
INTRAVENOUS | Status: DC
  Administered 2018-06-11: 15:00:00 via INTRAVENOUS

## 2018-06-11 MED ORDER — ONDANSETRON HCL 4 MG PO TABS: 4.0000 mg | ORAL_TABLET | Freq: Four times a day (QID) | ORAL | Status: DC | PRN

## 2018-06-11 MED ORDER — CARBOXYMETHYLCELLULOSE SODIUM 1 % OP SOLN: 1.0000 [drp] | Freq: Three times a day (TID) | OPHTHALMIC | Status: DC

## 2018-06-11 MED ORDER — NITROGLYCERIN 0.4 MG SL SUBL: 0.4000 mg | SUBLINGUAL_TABLET | SUBLINGUAL | Status: DC | PRN

## 2018-06-11 MED ORDER — ROSUVASTATIN CALCIUM 20 MG PO TABS
20.0000 mg | ORAL_TABLET | Freq: Every evening | ORAL | Status: DC
  Administered 2018-06-11 – 2018-06-15 (×5): 20 mg via ORAL
  Filled 2018-06-11 (×4): qty 1

## 2018-06-11 MED ORDER — VANCOMYCIN HCL 500 MG IV SOLR: 500.0000 mg | INTRAVENOUS | Status: DC

## 2018-06-11 MED ORDER — OLOPATADINE HCL 0.1 % OP SOLN
1.0000 [drp] | Freq: Two times a day (BID) | OPHTHALMIC | Status: DC
  Administered 2018-06-11 – 2018-06-15 (×7): 1 [drp] via OPHTHALMIC
  Filled 2018-06-11 (×2): qty 5

## 2018-06-11 MED ORDER — MONTELUKAST SODIUM 10 MG PO TABS
10.0000 mg | ORAL_TABLET | Freq: Every day | ORAL | Status: DC
  Administered 2018-06-11 – 2018-06-14 (×4): 10 mg via ORAL
  Filled 2018-06-11 (×5): qty 1

## 2018-06-11 MED ORDER — CYCLOSPORINE 0.05 % OP EMUL
1.0000 [drp] | Freq: Two times a day (BID) | OPHTHALMIC | Status: DC
  Administered 2018-06-11 – 2018-06-15 (×7): 1 [drp] via OPHTHALMIC
  Filled 2018-06-11 (×10): qty 1

## 2018-06-11 MED ORDER — FLUTICASONE PROPIONATE 50 MCG/ACT NA SUSP
1.0000 | Freq: Every day | NASAL | Status: DC
  Administered 2018-06-12 – 2018-06-15 (×2): 1 via NASAL
  Filled 2018-06-11: qty 16

## 2018-06-11 MED ORDER — PANTOPRAZOLE SODIUM 40 MG PO TBEC
40.0000 mg | DELAYED_RELEASE_TABLET | Freq: Two times a day (BID) | ORAL | Status: DC
  Administered 2018-06-11 – 2018-06-15 (×8): 40 mg via ORAL
  Filled 2018-06-11 (×9): qty 1

## 2018-06-11 MED ORDER — SODIUM CHLORIDE 0.9 % IV SOLN
1000.0000 mL | INTRAVENOUS | Status: DC
  Administered 2018-06-11: 1000 mL via INTRAVENOUS

## 2018-06-11 MED ORDER — APIXABAN 2.5 MG PO TABS
2.5000 mg | ORAL_TABLET | Freq: Two times a day (BID) | ORAL | Status: DC
  Administered 2018-06-11 – 2018-06-15 (×8): 2.5 mg via ORAL
  Filled 2018-06-11 (×9): qty 1

## 2018-06-11 MED ORDER — LORAZEPAM 0.5 MG PO TABS
0.5000 mg | ORAL_TABLET | Freq: Every day | ORAL | Status: DC
  Administered 2018-06-11 – 2018-06-14 (×4): 0.5 mg via ORAL
  Filled 2018-06-11 (×5): qty 1

## 2018-06-11 NOTE — H&P (Signed)
History and Physical    [name] KPV:[id number] DOB: Jun 08, 1930 DOA: 06/11/2018  PCP: Patient, No Pcp Per   Patient coming from: [facility].  I have personally briefly reviewed patient's old medical records in [place]  Chief Complaint:   Shortness of breath.  HPI: [name] is a 84 y.o. adult with medical history significant of anxiety, osteoarthritis, chronic diastolic CHF, CAD, CVA, depression, history of DVT, elevated troponin, history of C. difficile colitis, hypertension, history of falls, GERD, history of GI bleed, history of H CAP, history of hemorrhoids, hiatal hernia, hyperlipidemia, hypothyroidism, malnutrition, osteoporosis, PUD, rheumatoid arthritis, renal insufficiency, history of sepsis due to UTIs, TIA who is brought from [facility] due to progressively worse dyspnea for the past 2 weeks associated with generalized weakness and overall decline.  ED Course: Initial vital signs temperature 101.6 F, pulse 79, respirations 22, blood pressure 146/63 mmHg and O2 sat 100% on room air.  She did received IV fluids.  Cefepime and vancomycin in the emergency department.  Troponin was normal.  Lactic acid was slightly elevated at 2.06 and subsequently normalized 1.5 a millimoles/L.  White count was 14.2 with 83% neutrophils, hemoglobin was 8.6 g/dL and platelets 137.  CMP shows CO2 of 20 mmol/L, otherwise normal electrolytes when calcium is corrected to an albumin of 2.9 g/dL.  Total protein was 5.6 g/dL, the rest of the hepatic function tests were within normal limits.  Glucose 174, BUN 30 and creatinine 2.42 mg/dL baseline creatinine is usually below 2.  BNP was 1300.0 pg/mL.  Review of Systems: Unable to fully obtain history was given by the patient's daughter.   Past Medical History:  Diagnosis Date  . Anxiety   . Arthritis   . Chronic diastolic CHF (congestive heart failure) ([place])    a. 10/2014 Echo: EF 60-65%, mild LVH, grade 1 DD, dynamic obstruction @ rest -  peak velocity of 271 cm/sec, peak grad of 92mHg, PASP 448mg.  . Marland Kitchenoronary artery disease    a. 2012 s/p NSTEMI/Cath: 95% apical LAD dzs, otw nonobs dzs-->Med Rx;  b. 05/2011 MV: no ischemia, EF 79%, inf infarct- attenuation.  . CVA (cerebral vascular accident) ([facility]  . Depression   . DVT of lower extremity (deep venous thrombosis) ([facility]  . Dyspnea   . Elevated troponin    a. 10/2014 in setting of sepsis/?HCAP.  . Marland Kitchennteritis due to Clostridium difficile 01/04/2013  . Essential hypertension   . Fall   . Fatigue   . GERD (gastroesophageal reflux disease)   . GI bleed 2005  . HCAP (healthcare-associated pneumonia) 01/22/2013  . Hemorrhoids   . Hiatal hernia   . Hyperlipidemia   . Hypothyroidism   . Malnutrition ([facility]  . Obstructive hypertrophic cardiomyopathy ([facility]   a. 10/2014 Echo: EF 60-65%, mild LVH, grade 1 DD, dynamic obstruction @ rest - peak velocity of 271 cm/sec, peak grad of 2972m, PASP 20m106m  . Osteoporosis   . PUD (peptic ulcer disease)   . RA (rheumatoid arthritis) (HCC)[place]. Recurrent colitis due to Clostridium difficile 06/02/2012  . Renal insufficiency   . Right ear pain   . Sepsis (HCC)[place]/22/2014   UTIs  . TIA (transient ischemic attack)   . Weight loss     Past Surgical History:  Procedure Laterality Date  . ANKLE SURGERY    . BACK SURGERY    . [place]  . CARDIAC CATHETERIZATION     SHOWED  RUPTURE PLAQUE IN THE LAD. THE LAD IS NONOBSTRUCTIVE WITH ONLY 30-40% STENOSIS  . COLONOSCOPY    . ESOPHAGOGASTRODUODENOSCOPY (EGD) WITH PROPOFOL N/A 05/26/2017   Procedure: ESOPHAGOGASTRODUODENOSCOPY (EGD) WITH PROPOFOL;  Surgeon: Lucilla Lame, MD;  Location: [facility] ENDOSCOPY;  Service: Endoscopy;  Laterality: N/A;  . KNEE SURGERY    . NSTEMI  06/2010  . SPINAL FUSION SURGERY       reports that he has never smoked. He has never used smokeless tobacco. He reports that he does not drink alcohol or use drugs.  Allergies  Allergen Reactions  .  Codeine Swelling  . Sulfa Antibiotics Other (See Comments)    GI upset  . Biphosphate Other (See Comments)  . Morphine And Related Other (See Comments)  . Percocet [Oxycodone-Acetaminophen] Other (See Comments)  . Plavix [Clopidogrel Bisulfate] Itching and Rash    Family History  Problem Relation Age of Onset  . Kidney disease Mother   . Kidney disease Brother   . Lung cancer Father    Prior to Admission medications   Medication Sig Start Date End Date Taking? Authorizing Provider  acetaminophen (TYLENOL) 325 MG tablet Take 650 mg by mouth every 6 (six) hours as needed for moderate pain or fever.   Yes [provider]  apixaban (ELIQUIS) 2.5 MG TABS tablet Take 1 tablet (2.5 mg total) by mouth 2 (two) times daily. Patient taking differently: Take 2.5 mg by mouth daily.  11/19/16  Yes Sheikh, Omair Latif, DO  Carboxymeth-Glycerin-Polysorb (REFRESH OPTIVE ADVANCED) 0.5-1-0.5 % SOLN Place 1 drop into both eyes 4 (four) times daily.   Yes [provider]  carboxymethylcellulose 1 % ophthalmic solution Place 1 drop into both eyes 3 (three) times daily.    Yes [provider]  cetirizine (ZYRTEC) 10 MG tablet Take 10 mg by mouth daily.    Yes [provider]  ciprofloxacin (CIPRO) 500 MG tablet Take 500 mg by mouth 2 (two) times daily. For 5 days for UTI 06/08/18 06/13/18 Yes [provider]  cloNIDine (CATAPRES) 0.1 MG tablet Take 0.1 mg by mouth every 8 (eight) hours as needed (for SBP>180 or DBP>100).   Yes [provider]  Cranberry 400 MG CAPS Take 400 mg by mouth 2 (two) times daily.    Yes [provider]  cycloSPORINE (RESTASIS) 0.05 % ophthalmic emulsion Place 1 drop into both eyes 2 (two) times daily.   Yes [provider]  denosumab (PROLIA) 60 MG/ML SOLN injection Inject 60 mg into the skin every 6 (six) months. Administer in upper arm, thigh, or abdomen   Yes [provider]  diclofenac sodium  (VOLTAREN) 1 % GEL Apply 2-4 g topically 3 (three) times daily. Apply to lower back for pain 03/28/18  Yes [provider]  enalapril (VASOTEC) 2.5 MG tablet Take 2.5 mg by mouth daily.   Yes [provider]  esomeprazole (NEXIUM) 40 MG capsule Take 40 mg by mouth 2 (two) times daily before a meal.    Yes [provider]  fluticasone (FLONASE) 50 MCG/ACT nasal spray Place 1 spray into both nostrils daily.   Yes [provider]  furosemide (LASIX) 40 MG tablet Take 1 tablet (40 mg total) by mouth daily. Patient taking differently: Take 20 mg by mouth daily.  11/20/16  Yes Sheikh, Omair Latif, DO  HYDROcodone-acetaminophen (NORCO/VICODIN) 5-325 MG tablet Take 1 tablet by mouth daily. Patient taking differently: Take 1 tablet by mouth 3 (three) times daily.  11/19/16  Yes Raiford Noble  Latif, DO  ipratropium-albuterol (DUONEB) 0.5-2.5 (3) MG/3ML SOLN Take 3 mLs by nebulization every 8 (eight) hours as needed (wheezing).    Yes [provider]  levothyroxine (SYNTHROID, LEVOTHROID) 25 MCG tablet Take 1 tablet (25 mcg total) by mouth daily before breakfast. 09/19/16  Yes Rosita Fire, MD  Lidocaine (ASPERCREME LIDOCAINE) 4 % PTCH Apply 2 patches topically See admin instructions. Apply one patch to lower back and one patch to right shoulder each morning. Remove patch after 12 hrs (on am, off pm)   Yes [provider]  loperamide (IMODIUM) 2 MG capsule Take 2 mg by mouth See admin instructions. Give 2 capsules (36m) initially followed by 238mafter each loose stool. Do not give if less than 3 stools or resident has cdif/norovirus dx. Do not exceed more than 1687mn a 24 hr period.   Yes [provider]  LORazepam (ATIVAN) 0.5 MG tablet Take one tablet by mouth every night at bedtime for anxiety Patient taking differently: Take 0.5 mg by mouth at bedtime. Take one tablet by mouth every night at bedtime for anxiety 11/19/16  Yes Sheikh, Omair  Latif, DO  meclizine (ANTIVERT) 12.5 MG tablet Take 12.5 mg by mouth 2 (two) times daily.    Yes [provider]  Menthol, Topical Analgesic, (BIOFREEZE) 4 % GEL Apply 1 application topically 2 (two) times daily. For wrist pain and shoulder pain   Yes [provider]  metoprolol tartrate (LOPRESSOR) 25 MG tablet Take 75 mg by mouth 2 (two) times daily.    Yes [provider]  mirtazapine (REMERON) 15 MG tablet Take 15 mg by mouth at bedtime.   Yes [provider]  montelukast (SINGULAIR) 10 MG tablet Take 10 mg by mouth at bedtime.    Yes [provider]  Multiple Vitamins-Minerals (CERTAVITE/ANTIOXIDANTS PO) Take 1 tablet by mouth daily.    Yes [provider]  nitrofurantoin, macrocrystal-monohydrate, (MACROBID) 100 MG capsule Take 100 mg by mouth daily.    Yes [provider]  nitroGLYCERIN (NITROSTAT) 0.4 MG SL tablet Place 0.4 mg under the tongue every 5 (five) minutes as needed for chest pain.   Yes [provider]  OXYGEN Inhale 2 L into the lungs as needed (To maintain saturations >90%).    Yes [provider]  PATADAY 0.2 % SOLN Place 1 drop into both eyes daily.  03/26/15  Yes [provider]  potassium chloride SA (K-DUR,KLOR-CON) 20 MEQ tablet Take 1 tablet (20 mEq total) by mouth daily. 09/19/16  Yes BhaRosita FireD  predniSONE (DELTASONE) 10 MG tablet Take 10 mg by mouth daily. For RA 03/08/15  Yes [provider]  rosuvastatin (CRESTOR) 20 MG tablet Take 20 mg by mouth every evening. For hyperlipidemia 07/05/13  Yes GreGerlene FeeP  sertraline (ZOLOFT) 50 MG tablet Take 50 mg by mouth every morning.    Yes [provider]  Cholecalciferol (VITAMIN D) 2000 UNITS CAPS Take 1 capsule (2,000 Units total) by mouth daily. Patient not taking: Reported on 06/11/2018 11/21/13   GreGerlene FeeP  lidocaine (LIDODERM) 5 % Place 1 patch onto the skin daily. Remove & Discard  patch within 12 hours or as directed by MD Patient not taking: Reported on 06/11/2018 04/04/15   EubLauree ChandlerP  ondansetron (ZOFRAN ODT) 4 MG disintegrating tablet Take 1 tablet (4 mg total) by mouth every 8 (eight) hours as needed for nausea or vomiting. Patient not taking: Reported on  06/11/2018 03/03/17   Quintella Reichert, MD    Physical Exam: Vitals:   06/11/18 0954 06/11/18 1006 06/11/18 1145 06/11/18 1300  BP:   (!) 146/63 134/86  Pulse:   79 (!) 48  Resp:   (!) 22 (!) 25  Temp:  (S) (!) 101.6 F (38.7 C)    TempSrc:  Rectal    SpO2:   100% 97%  Weight: 54.4 kg     Height: 5' 6"  (1.676 m)       Constitutional: NAD, calm, comfortable Eyes: PERRL, lids and conjunctivae normal ENMT: Mucous membranes are moist. Posterior pharynx clear of any exudate or lesions. Neck: normal, supple, no masses, no thyromegaly Respiratory: clear to auscultation bilaterally, no wheezing, no crackles. Normal respiratory effort. No accessory muscle use.  Cardiovascular: Regular rate and rhythm, no murmurs / rubs / gallops. No extremity edema. 2+ pedal pulses. No carotid bruits.  Abdomen: no tenderness, no masses palpated. No hepatosplenomegaly. Bowel sounds positive.  Musculoskeletal: no clubbing / cyanosis. No joint deformity upper and lower extremities. Good ROM, no contractures. Normal muscle tone.  Skin: no rashes, lesions, ulcers. No induration Neurologic: CN 2-12 grossly intact. Sensation intact, DTR normal. Strength 5/5 in all 4.  Psychiatric: Normal judgment and insight. Alert and oriented x 3. Normal mood.   Labs on Admission: I have personally reviewed following labs and imaging studies  CBC: Recent Labs  Lab 06/11/18 0956  WBC 14.2*  NEUTROABS 11.8*  HGB 8.6*  HCT 29.4*  MCV 90.5  PLT 660*   Basic Metabolic Panel: Recent Labs  Lab 06/11/18 0956  NA 143  K 4.8  CL 111  CO2 20*  GLUCOSE 174*  BUN 30*  CREATININE 2.42*  CALCIUM 8.4*   GFR: Estimated Creatinine  Clearance (by C-G formula based on SCr of 2.42 mg/dL (H)) Female: 14.3 mL/min (A) Female: 16.9 mL/min (A) Liver Function Tests: Recent Labs  Lab 06/11/18 0956  AST 23  ALT 22  ALKPHOS 57  BILITOT 0.6  PROT 5.6*  ALBUMIN 2.8*   No results for input(s): LIPASE, AMYLASE in the last 168 hours. No results for input(s): AMMONIA in the last 168 hours. Coagulation Profile: No results for input(s): INR, PROTIME in the last 168 hours. Cardiac Enzymes: No results for input(s): CKTOTAL, CKMB, CKMBINDEX, TROPONINI in the last 168 hours. BNP (last 3 results) No results for input(s): PROBNP in the last 8760 hours. HbA1C: No results for input(s): HGBA1C in the last 72 hours. CBG: No results for input(s): GLUCAP in the last 168 hours. Lipid Profile: No results for input(s): CHOL, HDL, LDLCALC, TRIG, CHOLHDL, LDLDIRECT in the last 72 hours. Thyroid Function Tests: No results for input(s): TSH, T4TOTAL, FREET4, T3FREE, THYROIDAB in the last 72 hours. Anemia Panel: No results for input(s): VITAMINB12, FOLATE, FERRITIN, TIBC, IRON, RETICCTPCT in the last 72 hours. Urine analysis:    Component Value Date/Time   COLORURINE YELLOW 06/11/2018 1217   APPEARANCEUR HAZY (A) 06/11/2018 1217   APPEARANCEUR Hazy 03/06/2013 1013   LABSPEC 1.008 06/11/2018 1217   LABSPEC 1.013 03/06/2013 1013   PHURINE 7.0 06/11/2018 1217   GLUCOSEU NEGATIVE 06/11/2018 1217   GLUCOSEU Negative 03/06/2013 1013   HGBUR SMALL (A) 06/11/2018 1217   BILIRUBINUR NEGATIVE 06/11/2018 1217   BILIRUBINUR Negative 03/06/2013 [place] 06/11/2018 1217   PROTEINUR NEGATIVE 06/11/2018 1217   UROBILINOGEN 0.2 12/18/2012 1203   NITRITE NEGATIVE 06/11/2018 1217   LEUKOCYTESUR LARGE (A) 06/11/2018 1217   LEUKOCYTESUR 3+ 03/06/2013 1013  Radiological Exams on Admission: Dg Chest Port 1 View  Result Date: 06/11/2018 CLINICAL DATA:  Cough and shortness of breath EXAM: PORTABLE CHEST 1 VIEW COMPARISON:  03/03/2017  FINDINGS: Cardiac shadow remains enlarged. Aortic calcifications are noted. Hiatal hernia is again identified and stable. The lungs are hypoinflated but clear. Fixation hardware is noted in the thoracic spine with discontinuity of the posterior fixation rod on the right in the mid to lower thoracic spine. These changes are stable over multiple previous exams. No new bony abnormality is seen. IMPRESSION: No acute abnormality noted.  Poor inspiratory effort is noted. Electronically Signed   By: Inez Catalina M.D.   On: 06/11/2018 11:07    11/14/2016 echocardiogram completed LV EF: 65% -   70%  ------------------------------------------------------------------- Indications:      Sepsis 038.9Septic shock 785.59.  ------------------------------------------------------------------- History:   PMH:  Fever. Shortness of Breath. Lactic Acidosis. Congestive heart failure.  ------------------------------------------------------------------- Study Conclusions  - Left ventricle: The cavity size was normal. There was mild focal   basal hypertrophy of the septum. Systolic function was vigorous.   The estimated ejection fraction was in the range of 65% to 70%.   Wall motion was normal; there were no regional wall motion   abnormalities. Doppler parameters are consistent with abnormal   left ventricular relaxation (grade 1 diastolic dysfunction).   Doppler parameters are consistent with high ventricular filling   pressure. - Mitral valve: Calcified annulus. The findings are consistent with   mild stenosis. There was mild regurgitation. Valve area by   pressure half-time: 2.06 cm^2. Valve area by continuity equation   (using LVOT flow): 1.44 cm^2. - Left atrium: The atrium was severely dilated. - Tricuspid valve: There was mild-moderate regurgitation. - Pulmonary arteries: Systolic pressure was moderately increased.   PA peak pressure: 62 mm Hg (S). - Pericardium, extracardiac: A small pericardial  effusion was   identified.  Impressions:  - Vigorous LV systolic function; mild diastolic dysfunction with   elevated LV filling pressure; MAC with mild MS (mean gradient 6   mmHg) and mild MR; severe LAE; mild to moderate TR with   moderately elevated pulmonary pressure; small pericardial   effusion.   EKG: Independently reviewed.  Vent. rate 82 BPM PR interval * ms QRS duration 90 ms QT/QTc 389/455 ms P-R-T axes 36 54 27 Sinus rhythm Ventricular premature complex since last tracing no significant change   Assessment/Plan Principal Problem:   HCAP (healthcare-associated pneumonia) Admit to telemetry/inpatient. Continue supplemental oxygen. Cefepime per pharmacy. Vancomycin per pharmacy. Check sputum Gram stain, culture and sensitivity. Check strep pneumoniae and Legionella urinary antigen. Follow-up blood culture and sensitivity. Bronchodilators as needed.  Active Problems:   Acute lower UTI Continue cefepime and vancomycin.    Acute kidney injury superimposed on CKD ([place]) Continue gentle and time-limited IV hydration. Follow-up intake and output. Follow-up renal function and electrolytes.    RA (rheumatoid arthritis) (HCC) On prednisone 10 mg p.o. daily.    Depression Continue lorazepam at bedtime. Daily Remeron 15 mg p.o. bedtime. Continue sertraline 50 mg p.o. in the morning.    Anemia of chronic disease Monitor hematocrit and hemoglobin.    HLD (hyperlipidemia) Continue Crestor 20 mg p.o. every evening    Coronary artery disease Denies chest pain. Continue statin, metoprolol and apixaban.    History of DVT (deep vein thrombosis) Continue apixaban.    Glaucoma Continue refresh, Restasis, Patanol drops.   DVT prophylaxis: Apixaban Code Status: Full code. Family Communication: Her daughter was present in  the ED room. Disposition Plan: Admit for IV antibiotics for 2 to 3 days. Consults called: Admission status:  Inpatient/telemetry.   Reubin Milan MD [facility] Pager [phone].  If 7PM-7AM, please contact night-coverage [email or web address] Password [facility]  06/11/2018, 2:38 PM

## 2018-06-11 NOTE — ED Triage Notes (Signed)
Pt here from [facility] facility via [place] c/o shortness of breath. Per daughter, she has had a generalized decline x2 weeks and shortness of breath on exertion. Nursing staff stood patient up for a weight today and her O2 sats were 83% on RA. EMS placed pt on 2-3 L of O2 and O2 now 96% on 3L. Hx pneumonia and CHF. Given 150 mls NS by EMS.

## 2018-06-11 NOTE — Progress Notes (Signed)
Pharmacy Antibiotic Note  [name] is a 84 y.o. adult admitted on 06/11/2018 with pneumonia.  Pharmacy has been consulted for vancomycin and cefepime dosing. Tmax is 101.6 and WBC is elevated at 14.2. SCr is elevated at 2.42 and lactic acid is elevated.   Plan: Vancomycin 1gm IV x 1 then 500mg  IV Q36H Cefepime 2gm IV x 1 then 1gm IV Q24H F/u renal fxn, C&S, clinical status and peak/trough at SS  Height: 5\' 6"  (167.6 cm) Weight: 120 lb (54.4 kg) IBW/kg (Calculated) : 59.3  No data recorded.  No results for input(s): WBC, CREATININE, LATICACIDVEN, VANCOTROUGH, VANCOPEAK, VANCORANDOM, GENTTROUGH, GENTPEAK, GENTRANDOM, TOBRATROUGH, TOBRAPEAK, TOBRARND, AMIKACINPEAK, AMIKACINTROU, AMIKACIN in the last 168 hours.  CrCl cannot be calculated (Patient's most recent lab result is older than the maximum 21 days allowed.).    Allergies  Allergen Reactions  . Codeine Swelling  . Sulfa Antibiotics Other (See Comments)    GI upset  . Biphosphate Other (See Comments)  . Morphine And Related Other (See Comments)  . Percocet [Oxycodone-Acetaminophen] Other (See Comments)  . Plavix [Clopidogrel Bisulfate] Itching and Rash    Antimicrobials this admission: Vanc 12/13>> Cefepime 12/13>>  Dose adjustments this admission: N/A  Microbiology results: Pending  Thank you for allowing pharmacy to be a part of this patient's care.  Travonte Byard, Rande Lawman 06/11/2018 9:59 AM

## 2018-06-11 NOTE — ED Provider Notes (Addendum)
[facility] EMERGENCY DEPARTMENT Provider Note   CSN: [id number] Arrival date & time: 06/11/18  0944     History   Chief Complaint Chief Complaint  Patient presents with  . Shortness of Breath    HPI [name] is a 84 y.o. adult.  HPI  The patient is a 84 year old female, prior history of stroke, coronary disease status post heart catheterization, ejection fraction of 60 to 65% in 2016 with grade 1 diastolic dysfunction, currently living in a nursing facility at [facility], when asked why she is here she states because I do not like living there, I do not like the food.  Paramedics were called out for shortness of breath and noted the patient to be hypoxic to the low 80% range, she does not use home oxygen.  When she was stood up to use the bathroom she became acutely hypoxic and in respiratory distress.  This has continued throughout the prehospital transport.  The patient states that she is short of breath, occasional coughing, denies swelling, states that she is also progressively weak over the last couple of weeks.  Symptoms are persistent, gradually worsening, became acutely worse this morning and or not associated with fevers as far as the patient knows.  Paramedics did give prehospital oxygen with some improvement.  Additional history obtained from the medical record shows that the patient had an ejection fraction of 65 to 70% during an admission to the hospital for sepsis.  She was noted to have grade 1 diastolic dysfunction at that time as well.    Past Medical History:  Diagnosis Date  . Anxiety   . Arthritis   . Chronic diastolic CHF (congestive heart failure) ([place])    a. 10/2014 Echo: EF 60-65%, mild LVH, grade 1 DD, dynamic obstruction @ rest - peak velocity of 271 cm/sec, peak grad of 21mmHg, PASP 53mmHg.  Marland Kitchen Coronary artery disease    a. 2012 s/p NSTEMI/Cath: 95% apical LAD dzs, otw nonobs dzs-->Med Rx;  b. 05/2011 MV: no ischemia, EF 79%, inf  infarct- attenuation.  . CVA (cerebral vascular accident) ([place])   . Depression   . DVT of lower extremity (deep venous thrombosis) ([place])   . Dyspnea   . Elevated troponin    a. 10/2014 in setting of sepsis/?HCAP.  Marland Kitchen Enteritis due to Clostridium difficile 01/04/2013  . Essential hypertension   . Fall   . Fatigue   . GERD (gastroesophageal reflux disease)   . GI bleed 2005  . HCAP (healthcare-associated pneumonia) 01/22/2013  . Hemorrhoids   . Hiatal hernia   . Hyperlipidemia   . Hypothyroidism   . Malnutrition ([place])   . Obstructive hypertrophic cardiomyopathy ([place])    a. 10/2014 Echo: EF 60-65%, mild LVH, grade 1 DD, dynamic obstruction @ rest - peak velocity of 271 cm/sec, peak grad of 22mmHg, PASP 50mmHg.  . Osteoporosis   . PUD (peptic ulcer disease)   . RA (rheumatoid arthritis) ([place])   . Recurrent colitis due to Clostridium difficile 06/02/2012  . Renal insufficiency   . Right ear pain   . Sepsis ([place]) 12/19/2012   UTIs  . TIA (transient ischemic attack)   . Weight loss     Patient Active Problem List   Diagnosis Date Noted  . Diarrhea   . AKI (acute kidney injury) ([place]) 05/24/2017  . Dehydration 05/24/2017  . Chronic nausea 05/24/2017  . Hyperglycemia 11/14/2016  . History of DVT (deep vein thrombosis) 11/13/2016  . Sepsis ([place]) 09/15/2016  .  Wrist swelling, left 09/15/2016  . Acute encephalopathy 09/15/2016  . Pain and swelling of left wrist   . UTI (urinary tract infection) 04/08/2016  . Benign paroxysmal positional vertigo 12/19/2015  . Easy bruising 09/20/2015  . CKD (chronic kidney disease) stage 3, GFR 30-59 ml/min (HCC) 07/27/2015  . Anemia of chronic disease 07/27/2015  . Pulmonary hypertension ([place])   . Generalized osteoarthritis 04/11/2015  . Thyroid activity decreased 04/11/2015  . Coronary artery disease   . Obstructive hypertrophic cardiomyopathy ([place])   . Chronic diastolic CHF (congestive heart failure) ([place])   . HCAP (healthcare-associated  pneumonia) 11/24/2014  . Vertigo 05/17/2014  . Hemorrhoids 04/24/2014  . Hypothyroidism 12/16/2013  . HLD (hyperlipidemia) 12/16/2013  . Benign hypertensive heart and kidney disease with diastolic CHF, NYHA class II and CKD stage III ([place]) 12/16/2013  . Allergic rhinitis 07/05/2013  . Acute kidney injury superimposed on CKD ([place]) 01/22/2013  . Adrenal insufficiency ([place]) 12/08/2011    Class: Chronic  . History of CVA (cerebrovascular accident) 12/07/2011    Class: Acute  . Dysphagia 12/07/2011    Class: Acute  . MI (myocardial infarction) ([place])   . RA (rheumatoid arthritis) ([place])   . PUD (peptic ulcer disease)   . Hiatal hernia with gastroesophageal reflux   . Hiatal hernia   . Osteoporosis   . Depression   . Anxiety     Past Surgical History:  Procedure Laterality Date  . ANKLE SURGERY    . BACK SURGERY    . [place]   x3  . CARDIAC CATHETERIZATION     SHOWED RUPTURE PLAQUE IN THE LAD. THE LAD IS NONOBSTRUCTIVE WITH ONLY 30-40% STENOSIS  . COLONOSCOPY    . ESOPHAGOGASTRODUODENOSCOPY (EGD) WITH PROPOFOL N/A 05/26/2017   Procedure: ESOPHAGOGASTRODUODENOSCOPY (EGD) WITH PROPOFOL;  Surgeon: Lucilla Lame, MD;  Location: [facility] ENDOSCOPY;  Service: Endoscopy;  Laterality: N/A;  . KNEE SURGERY    . NSTEMI  06/2010  . SPINAL FUSION SURGERY       OB History   No obstetric history on file.      Home Medications    Prior to Admission medications   Medication Sig Start Date End Date Taking? Authorizing Provider  apixaban (ELIQUIS) 2.5 MG TABS tablet Take 1 tablet (2.5 mg total) by mouth 2 (two) times daily. 11/19/16   Raiford Noble Latif, DO  Biotin 300 MCG TABS Take 300 mcg by mouth daily.    [provider]  carboxymethylcellulose 1 % ophthalmic solution Apply 1 drop to eye 3 (three) times daily. Both eyes    [provider]  cetirizine (ZYRTEC) 10 MG tablet Take 10 mg by mouth daily.     [provider]  Cholecalciferol (VITAMIN D) 2000  UNITS CAPS Take 1 capsule (2,000 Units total) by mouth daily. 11/21/13   [name], NP  Cranberry 200 MG CAPS Take 2 capsules by mouth 2 (two) times daily.     [provider]  denosumab (PROLIA) 60 MG/ML SOLN injection Inject 60 mg into the skin every 6 (six) months. Administer in upper arm, thigh, or abdomen    [provider]  diclofenac sodium (VOLTAREN) 1 % GEL  03/28/18   [provider]  esomeprazole (NEXIUM) 40 MG capsule Take 40 mg by mouth 2 (two) times daily before a meal.     [provider]  ferrous sulfate 325 (65 FE) MG tablet Take 325 mg by mouth daily.     [provider]  furosemide (LASIX) 40 MG tablet Take 1 tablet (40 mg total) by mouth daily. 11/20/16   Raiford Noble Latif, DO  HYDROcodone-acetaminophen (NORCO/VICODIN) 5-325 MG tablet Take 1 tablet by mouth daily. Patient taking differently: Take 1 tablet by mouth daily as needed for severe pain.  11/19/16   Raiford Noble Latif, DO  ipratropium (ATROVENT) 0.03 % nasal spray Place 2 sprays into both nostrils daily as needed (For asthma).     [provider]  ipratropium-albuterol (DUONEB) 0.5-2.5 (3) MG/3ML SOLN Take 3 mLs by nebulization every 8 (eight) hours as needed (wheezing).     [provider]  levothyroxine (SYNTHROID, LEVOTHROID) 25 MCG tablet Take 1 tablet (25 mcg total) by mouth daily before breakfast. 09/19/16   Rosita Fire, MD  lidocaine (LIDODERM) 5 % Place 1 patch onto the skin daily. Remove & Discard patch within 12 hours or as directed by MD 04/04/15   Lauree Chandler, NP  LORazepam (ATIVAN) 0.5 MG tablet Take one tablet by mouth every night at bedtime for anxiety 11/19/16   Raiford Noble Latif, DO  meclizine (ANTIVERT) 12.5 MG tablet Take 12.5 mg by mouth 2 (two) times daily.     [provider]  Menthol, Topical Analgesic, (BIOFREEZE) 4 % GEL Apply 1 application topically 2 (two) times daily. For wrist pain     [provider]  metoprolol (LOPRESSOR) 50 MG tablet Take 50 mg by mouth 2 (two) times daily.     [provider]  mirtazapine (REMERON) 15 MG tablet Take 15 mg by mouth at bedtime.    [provider]  montelukast (SINGULAIR) 10 MG tablet Take 10 mg by mouth at bedtime.     [provider]  Multiple Vitamins-Minerals (CERTAVITE/ANTIOXIDANTS PO) Take 1 tablet by mouth daily.     [provider]  nitrofurantoin, macrocrystal-monohydrate, (MACROBID) 100 MG capsule Take 100 mg by mouth daily.     [provider]  nitroGLYCERIN (NITROSTAT) 0.4 MG SL tablet Place 0.4 mg under the tongue every 5 (five) minutes as needed for chest pain.    [provider]  ondansetron (ZOFRAN ODT) 4 MG disintegrating tablet Take 1 tablet (4 mg total) by mouth every 8 (eight) hours as needed for nausea or vomiting. 03/03/17   Quintella Reichert, MD  ondansetron (ZOFRAN) 4 MG tablet Take 4 mg by mouth every 6 (six) hours as needed for nausea or vomiting.    [provider]  OXYGEN Inhale 2 L into the lungs as needed (To maintain saturations >90%).     [provider]  PATADAY 0.2 % SOLN Place 1 drop into both eyes daily.  03/26/15   [provider]  potassium chloride SA (K-DUR,KLOR-CON) 20 MEQ tablet Take 1 tablet (20 mEq total) by mouth daily. 09/19/16   Rosita Fire, MD  predniSONE (DELTASONE) 10 MG tablet Take 10 mg by mouth daily. For RA 03/08/15   [provider]  rosuvastatin (CRESTOR) 20 MG tablet Take 20 mg by mouth every evening. For hyperlipidemia 07/05/13   [name], NP  sertraline (ZOLOFT) 50 MG tablet Take 50 mg by mouth every morning. For depression    [provider]    Family History Family History  Problem Relation Age of Onset  . Kidney disease Mother   . Kidney disease Brother   . Lung cancer Father     Social History Social History   Tobacco Use  . Smoking status: Never Smoker  . Smokeless  tobacco: Never Used  Substance Use Topics  . Alcohol use: No  . Drug use: No     Allergies   Codeine; Sulfa antibiotics; Biphosphate; Morphine and related; Percocet [oxycodone-acetaminophen]; and Plavix [clopidogrel bisulfate]   Review of Systems Review of Systems  All other systems reviewed and are negative.    Physical Exam Updated Vital Signs LMP  (LMP Unknown)   Physical Exam Vitals signs and nursing note reviewed.  Constitutional:      General: He is not in acute distress.    Appearance: He is well-developed. He is ill-appearing.  HENT:     Head: Normocephalic and atraumatic.     Mouth/Throat:     Pharynx: No oropharyngeal exudate.  Eyes:     General: No scleral icterus.       Right eye: No discharge.        Left eye: No discharge.     Conjunctiva/sclera: Conjunctivae normal.     Pupils: Pupils are equal, round, and reactive to light.  Neck:     Musculoskeletal: Normal range of motion and neck supple.     Thyroid: No thyromegaly.     Vascular: No JVD.  Cardiovascular:     Rate and Rhythm: Regular rhythm.     Heart sounds: Normal heart sounds. No murmur. No friction rub. No gallop.      Comments: Borderline tachycardia Pulmonary:     Effort: Respiratory distress present.     Breath sounds: Rhonchi and rales present. No wheezing.  Abdominal:     General: Bowel sounds are normal. There is no distension.     Palpations: Abdomen is soft. There is no mass.     Tenderness: There is no abdominal tenderness.  Musculoskeletal: Normal range of motion.        General: No tenderness.  Lymphadenopathy:     Cervical: No cervical adenopathy.  Skin:    General: Skin is warm and dry.     Findings: No erythema or rash.  Neurological:     Mental Status: He is alert.     Coordination: Coordination normal.  Psychiatric:        Behavior: Behavior normal.      ED Treatments / Results  Labs (all labs ordered are listed, but only abnormal results are displayed) Labs  Reviewed - No data to display  EKG None  Radiology No results found.  Procedures .Critical Care Performed by: Noemi Chapel, MD Authorized by: Noemi Chapel, MD   Critical care provider statement:    Critical care time (minutes):  35   Critical care time was exclusive of:  Separately billable procedures and treating other patients and teaching time   Critical care was necessary to treat or prevent imminent or life-threatening deterioration of the following conditions:  Sepsis   Critical care was time spent personally by me on the following activities:  Blood draw for specimens, development of treatment plan with patient or surrogate, discussions with consultants, evaluation of patient's response to treatment, examination of patient, obtaining history from patient or surrogate, ordering and performing treatments and interventions, ordering and review of laboratory studies, ordering and review of radiographic studies, pulse oximetry, re-evaluation of patient's condition and review of old charts   (including critical care time)  Medications Ordered in ED Medications - No data to display   Initial Impression / Assessment and Plan / ED Course  I have reviewed the triage vital signs and the nursing notes.  Pertinent labs & imaging results that were available during  my care of the patient were reviewed by me and considered in my medical decision making (see chart for details).  Clinical Course as of Jun 11 1150  Fri Jun 11, 2018  1110 The patient's laboratory work-up shows chronic anemia, white blood cell count of 14,000 and creatinine is increased from baseline at 2.4.  Baseline is 2.0 or slightly lower.   [BM]  1110 I have personally looked at the x-ray, there is some left-sided fullness, no right-sided infiltrate or pneumothorax   [BM]    Clinical Course User Index [BM] Noemi Chapel, MD   The patient is ill-appearing, she is tachypneic, using accessory muscles, she has some rales  primarily on the left and some mild lower extremity edema.  She is hypoxic to less than 90% on room air, she will need further evaluation and likely hospitalization.  Question whether this is cardiac versus pulmonary versus infectious etiology.  Chest x-ray without definite cause of infection however the patient does have left-sided rales and this seems to be obscured by poor inspiratory effort.  No leukocytosis, the slightly elevated lactic acid is just over 2, the fever all question whether the patient is septic.  She has been given broad-spectrum antibiotics, to IV antibiotics, some IV fluids, her creatinine is up, discussed with the hospitalist Dr. Olevia Bowens who will admit.  Final Clinical Impressions(s) / ED Diagnoses   Final diagnoses:  Sepsis without acute organ dysfunction, due to unspecified organism ([place])  HCAP (healthcare-associated pneumonia)      Noemi Chapel, MD 06/11/18 1152    Noemi Chapel, MD 06/11/18 1153

## 2018-06-12 ENCOUNTER — Inpatient Hospital Stay (HOSPITAL_COMMUNITY): Payer: Medicare Other

## 2018-06-12 DIAGNOSIS — N390 Urinary tract infection, site not specified: Secondary | ICD-10-CM

## 2018-06-12 DIAGNOSIS — N179 Acute kidney failure, unspecified: Secondary | ICD-10-CM

## 2018-06-12 DIAGNOSIS — D638 Anemia in other chronic diseases classified elsewhere: Secondary | ICD-10-CM

## 2018-06-12 DIAGNOSIS — N189 Chronic kidney disease, unspecified: Secondary | ICD-10-CM

## 2018-06-12 LAB — BASIC METABOLIC PANEL
Anion gap: 10 (ref 5–15)
BUN: 29 mg/dL — ABNORMAL HIGH (ref 8–23)
CALCIUM: 7.7 mg/dL — AB (ref 8.9–10.3)
CHLORIDE: 110 mmol/L (ref 98–111)
CO2: 21 mmol/L — AB (ref 22–32)
CREATININE: 2.23 mg/dL — AB (ref 0.44–1.00)
GFR calc Af Amer: 22 mL/min — ABNORMAL LOW (ref >60)
GFR calc non Af Amer: 19 mL/min — ABNORMAL LOW (ref >60)
Glucose, Bld: 130 mg/dL — ABNORMAL HIGH (ref 70–99)
Potassium: 5.1 mmol/L (ref 3.5–5.1)
Sodium: 141 mmol/L (ref 135–145)

## 2018-06-12 LAB — CBC
HCT: 28.1 % — ABNORMAL LOW (ref 36.0–46.0)
Hemoglobin: 8 g/dL — ABNORMAL LOW (ref 12.0–15.0)
MCH: 25.6 pg — ABNORMAL LOW (ref 26.0–34.0)
MCHC: 28.5 g/dL — ABNORMAL LOW (ref 30.0–36.0)
MCV: 90.1 fL (ref 80.0–100.0)
Platelets: 121 10*3/uL — ABNORMAL LOW (ref 150–400)
RBC: 3.12 MIL/uL — ABNORMAL LOW (ref 3.87–5.11)
RDW: 17.4 % — ABNORMAL HIGH (ref 11.5–15.5)
WBC: 10.9 10*3/uL — ABNORMAL HIGH (ref 4.0–10.5)
nRBC: 0 % (ref 0.0–0.2)

## 2018-06-12 MED ORDER — IPRATROPIUM-ALBUTEROL 0.5-2.5 (3) MG/3ML IN SOLN
3.0000 mL | Freq: Four times a day (QID) | RESPIRATORY_TRACT | Status: DC | PRN
  Administered 2018-06-14: 3 mL via RESPIRATORY_TRACT
  Filled 2018-06-12: qty 3

## 2018-06-12 NOTE — Progress Notes (Signed)
Took patient's oxygen off, and her O2 saturation is 92% and above on RA. Will continue to monitor.   Farley Ly RN

## 2018-06-12 NOTE — Progress Notes (Signed)
PROGRESS NOTE    [name]   PRF:[id number]  DOB: [birth year]  DOA: 06/11/2018 PCP: Patient, No Pcp Per   Brief Narrative:  [name] is an 84 y/o with dementia, RA, CAD s/p stents, mild d CHF (grade 1), CVA, DVT, GERD,hypothyroid, c diff colitis,  CKD 3 brought from [facility] for dyspnea, weakness and found to be hypoxic with pulse ox of 83%, febrile with temp of 101.6.  Noted to have unilateral crackles in ED although no confirmed pneumonia on imaging, had high suspicion for pneumonia.   Subjective: She has no complaints but states she feels better.    Assessment & Plan:   Principal Problem:   HCAP (healthcare-associated pneumonia)/ UTI/ Sepsis  - Cipro mentioned on Med rec to have been started for UTI - MRSA PCR neg- d/c Vanc - unfortunately urine culture not sent - blood culture negative - WBC improving - will treat her for pneumonia for now and repeat a CXR (PA/Lat) - discussed plan with daughter  Active Problems: AKI/ CKD 3 - Cr up to 2.08 from 1.56- hold lasix and Vasotec today    RA (rheumatoid arthritis)   - Prednisone  HTN - Clonidine PRN, Lopressor BID- holding Vasotec and Lasix- BP stable  Hypothyoroid - Synthroid     Anemia of chronic disease - Hb in the past ranging from 9-11- currently about 8-9 - follow    History of DVT (deep vein thrombosis) - on Eliquis   Dementia   DVT prophylaxis: Eliquis Code Status: Full code Family Communication: daughter Disposition Plan: return to SNF Consultants:   none Procedures:   none Antimicrobials:  Anti-infectives (From admission, onward)   Start     Dose/Rate Route Frequency Ordered Stop   06/13/18 0000  vancomycin (VANCOCIN) 500 mg in sodium chloride 0.9 % 100 mL IVPB     500 mg 100 mL/hr over 60 Minutes Intravenous Every 36 hours 06/11/18 1127     06/12/18 1200  ceFEPIme (MAXIPIME) 1 g in sodium chloride 0.9 % 100 mL IVPB     1 g 200 mL/hr over 30 Minutes Intravenous Every 24 hours  06/11/18 1127     06/11/18 1000  vancomycin (VANCOCIN) IVPB 1000 mg/200 mL premix     1,000 mg 200 mL/hr over 60 Minutes Intravenous  Once 06/11/18 0956 06/11/18 1304   06/11/18 1000  ceFEPIme (MAXIPIME) 2 g in sodium chloride 0.9 % 100 mL IVPB     2 g 200 mL/hr over 30 Minutes Intravenous  Once 06/11/18 0956 06/11/18 1202       Objective: Vitals:   06/11/18 1720 06/11/18 2100 06/11/18 2134 06/12/18 0556  BP:   138/72 (!) 143/84  Pulse:  89 86 70  Resp:  20 16 16   Temp:   98.4 F (36.9 C) 97.7 F (36.5 C)  TempSrc:   Oral Oral  SpO2: 98% 98% 100% 100%  Weight:      Height:        Intake/Output Summary (Last 24 hours) at 06/12/2018 0733 Last data filed at 06/12/2018 9357 Gross per 24 hour  Intake 0 ml  Output 0 ml  Net 0 ml   Filed Weights   06/11/18 0954  Weight: 54.4 kg    Examination: General exam: Appears comfortable  HEENT: PERRLA, oral mucosa moist, no sclera icterus or thrush Respiratory system: mild congestion Cardiovascular system: S1 & S2 heard, RRR.   Gastrointestinal system: Abdomen soft, non-tender, nondistended. Normal bowel sounds. Central nervous system: Alert - oriented to  person only. No focal neurological deficits. Extremities: No cyanosis, clubbing or edema Skin: No rashes or ulcers     Data Reviewed: I have personally reviewed following labs and imaging studies  CBC: Recent Labs  Lab 06/11/18 0956  WBC 14.2*  NEUTROABS 11.8*  HGB 8.6*  HCT 29.4*  MCV 90.5  PLT 161*   Basic Metabolic Panel: Recent Labs  Lab 06/11/18 0956  NA 143  K 4.8  CL 111  CO2 20*  GLUCOSE 174*  BUN 30*  CREATININE 2.42*  CALCIUM 8.4*   GFR: Estimated Creatinine Clearance (by C-G formula based on SCr of 2.42 mg/dL (H)) Female: 14.3 mL/min (A) Female: 16.9 mL/min (A) Liver Function Tests: Recent Labs  Lab 06/11/18 0956  AST 23  ALT 22  ALKPHOS 57  BILITOT 0.6  PROT 5.6*  ALBUMIN 2.8*   No results for input(s): LIPASE, AMYLASE in the last  168 hours. No results for input(s): AMMONIA in the last 168 hours. Coagulation Profile: No results for input(s): INR, PROTIME in the last 168 hours. Cardiac Enzymes: No results for input(s): CKTOTAL, CKMB, CKMBINDEX, TROPONINI in the last 168 hours. BNP (last 3 results) No results for input(s): PROBNP in the last 8760 hours. HbA1C: No results for input(s): HGBA1C in the last 72 hours. CBG: No results for input(s): GLUCAP in the last 168 hours. Lipid Profile: No results for input(s): CHOL, HDL, LDLCALC, TRIG, CHOLHDL, LDLDIRECT in the last 72 hours. Thyroid Function Tests: No results for input(s): TSH, T4TOTAL, FREET4, T3FREE, THYROIDAB in the last 72 hours. Anemia Panel: No results for input(s): VITAMINB12, FOLATE, FERRITIN, TIBC, IRON, RETICCTPCT in the last 72 hours. Urine analysis:    Component Value Date/Time   COLORURINE YELLOW 06/11/2018 1217   APPEARANCEUR HAZY (A) 06/11/2018 1217   APPEARANCEUR Hazy 03/06/2013 1013   LABSPEC 1.008 06/11/2018 1217   LABSPEC 1.013 03/06/2013 1013   PHURINE 7.0 06/11/2018 1217   GLUCOSEU NEGATIVE 06/11/2018 1217   GLUCOSEU Negative 03/06/2013 1013   HGBUR SMALL (A) 06/11/2018 1217   BILIRUBINUR NEGATIVE 06/11/2018 1217   BILIRUBINUR Negative 03/06/2013 1013   KETONESUR NEGATIVE 06/11/2018 1217   PROTEINUR NEGATIVE 06/11/2018 1217   UROBILINOGEN 0.2 12/18/2012 1203   NITRITE NEGATIVE 06/11/2018 1217   LEUKOCYTESUR LARGE (A) 06/11/2018 1217   LEUKOCYTESUR 3+ 03/06/2013 1013   Sepsis Labs: @LABRCNTIP (procalcitonin:4,lacticidven:4) ) Recent Results (from the past 240 hour(s))  MRSA PCR Screening     Status: None   Collection Time: 06/11/18  9:29 PM  Result Value Ref Range Status   MRSA by PCR NEGATIVE NEGATIVE Final    Comment:        The GeneXpert MRSA Assay (FDA approved for NASAL specimens only), is one component of a comprehensive MRSA colonization surveillance program. It is not intended to diagnose MRSA infection nor to  guide or monitor treatment for MRSA infections. Performed at [facility], [place] [address], [place],  [id number]          Radiology Studies: Dg Chest Port 1 View  Result Date: 06/11/2018 CLINICAL DATA:  Cough and shortness of breath EXAM: PORTABLE CHEST 1 VIEW COMPARISON:  03/03/2017 FINDINGS: Cardiac shadow remains enlarged. Aortic calcifications are noted. Hiatal hernia is again identified and stable. The lungs are hypoinflated but clear. Fixation hardware is noted in the thoracic spine with discontinuity of the posterior fixation rod on the right in the mid to lower thoracic spine. These changes are stable over multiple previous exams. No new bony abnormality is seen.  IMPRESSION: No acute abnormality noted.  Poor inspiratory effort is noted. Electronically Signed   By: Inez Catalina M.D.   On: 06/11/2018 11:07      Scheduled Meds: . apixaban  2.5 mg Oral BID  . cycloSPORINE  1 drop Both Eyes BID  . fluticasone  1 spray Each Nare Daily  . levothyroxine  25 mcg Oral QAC breakfast  . loratadine  10 mg Oral Daily  . LORazepam  0.5 mg Oral QHS  . metoprolol tartrate  75 mg Oral BID  . mirtazapine  15 mg Oral QHS  . montelukast  10 mg Oral QHS  . olopatadine  1 drop Both Eyes BID  . pantoprazole  40 mg Oral BID  . potassium chloride SA  20 mEq Oral Daily  . predniSONE  10 mg Oral Daily  . rosuvastatin  20 mg Oral QPM  . sertraline  50 mg Oral q morning - 10a   Continuous Infusions: . ceFEPime (MAXIPIME) IV    . [START ON 06/13/2018] vancomycin       LOS: 1 day    Time spent in minutes: [place], MD [facility] Pager: [email or web address] Password [facility] 06/12/2018, 7:33 AM

## 2018-06-13 ENCOUNTER — Inpatient Hospital Stay (HOSPITAL_COMMUNITY): Payer: Medicare Other

## 2018-06-13 LAB — IRON AND TIBC
Iron: 20 ug/dL — ABNORMAL LOW (ref 28–170)
Saturation Ratios: 6 % — ABNORMAL LOW (ref 10.4–31.8)
TIBC: 316 ug/dL (ref 250–450)
UIBC: 296 ug/dL

## 2018-06-13 LAB — RESPIRATORY PANEL BY PCR
Adenovirus: NOT DETECTED
Bordetella pertussis: NOT DETECTED
Chlamydophila pneumoniae: NOT DETECTED
Coronavirus 229E: NOT DETECTED
Coronavirus HKU1: NOT DETECTED
Coronavirus NL63: NOT DETECTED
Coronavirus OC43: NOT DETECTED
INFLUENZA A-RVPPCR: NOT DETECTED
INFLUENZA B-RVPPCR: NOT DETECTED
Metapneumovirus: NOT DETECTED
Mycoplasma pneumoniae: NOT DETECTED
PARAINFLUENZA VIRUS 4-RVPPCR: NOT DETECTED
Parainfluenza Virus 1: NOT DETECTED
Parainfluenza Virus 2: NOT DETECTED
Parainfluenza Virus 3: NOT DETECTED
Respiratory Syncytial Virus: NOT DETECTED
Rhinovirus / Enterovirus: NOT DETECTED

## 2018-06-13 LAB — CBC
HCT: 26.7 % — ABNORMAL LOW (ref 36.0–46.0)
Hemoglobin: 7.7 g/dL — ABNORMAL LOW (ref 12.0–15.0)
MCH: 25.8 pg — ABNORMAL LOW (ref 26.0–34.0)
MCHC: 28.8 g/dL — ABNORMAL LOW (ref 30.0–36.0)
MCV: 89.6 fL (ref 80.0–100.0)
PLATELETS: 126 10*3/uL — AB (ref 150–400)
RBC: 2.98 MIL/uL — ABNORMAL LOW (ref 3.87–5.11)
RDW: 17.7 % — ABNORMAL HIGH (ref 11.5–15.5)
WBC: 11 10*3/uL — ABNORMAL HIGH (ref 4.0–10.5)
nRBC: 0 % (ref 0.0–0.2)

## 2018-06-13 LAB — FERRITIN: Ferritin: 60 ng/mL (ref 11–307)

## 2018-06-13 LAB — RETICULOCYTES
Immature Retic Fract: 17.3 % — ABNORMAL HIGH (ref 2.3–15.9)
RBC.: 3.16 MIL/uL — ABNORMAL LOW (ref 3.87–5.11)
Retic Count, Absolute: 70.8 10*3/uL (ref 19.0–186.0)
Retic Ct Pct: 2.2 % (ref 0.4–3.1)

## 2018-06-13 LAB — BASIC METABOLIC PANEL
Anion gap: 10 (ref 5–15)
BUN: 28 mg/dL — ABNORMAL HIGH (ref 8–23)
CO2: 19 mmol/L — ABNORMAL LOW (ref 22–32)
Calcium: 7.7 mg/dL — ABNORMAL LOW (ref 8.9–10.3)
Chloride: 115 mmol/L — ABNORMAL HIGH (ref 98–111)
Creatinine, Ser: 2.12 mg/dL — ABNORMAL HIGH (ref 0.44–1.00)
GFR calc Af Amer: 24 mL/min — ABNORMAL LOW (ref >60)
GFR, EST NON AFRICAN AMERICAN: 21 mL/min — AB (ref >60)
Glucose, Bld: 101 mg/dL — ABNORMAL HIGH (ref 70–99)
Potassium: 4.7 mmol/L (ref 3.5–5.1)
Sodium: 144 mmol/L (ref 135–145)

## 2018-06-13 LAB — FOLATE: Folate: 13.7 ng/mL (ref >5.9)

## 2018-06-13 LAB — VITAMIN B12: Vitamin B-12: 568 pg/mL (ref 180–914)

## 2018-06-13 MED ORDER — SODIUM CHLORIDE 0.9 % IV BOLUS
500.0000 mL | Freq: Once | INTRAVENOUS | Status: AC
  Administered 2018-06-13: 500 mL via INTRAVENOUS

## 2018-06-13 MED ORDER — SODIUM CHLORIDE 0.9 % IV BOLUS: 500.0000 mL | Freq: Once | INTRAVENOUS | Status: DC

## 2018-06-13 MED ORDER — FUROSEMIDE 10 MG/ML IJ SOLN
40.0000 mg | Freq: Once | INTRAMUSCULAR | Status: AC
  Administered 2018-06-13: 40 mg via INTRAVENOUS
  Filled 2018-06-13: qty 4

## 2018-06-13 MED ORDER — SODIUM CHLORIDE 0.9 % IV SOLN
INTRAVENOUS | Status: DC
  Administered 2018-06-13: 12:00:00 via INTRAVENOUS

## 2018-06-13 NOTE — Progress Notes (Signed)
After 500 mL bolus, patient's BP and RR were elevated. MD paged, received orders for IV lasix and chest xray.

## 2018-06-13 NOTE — Progress Notes (Signed)
PROGRESS NOTE    [name]   RKY:[id number]  DOB: 1930-05-08  DOA: 06/11/2018 PCP: Patient, No Pcp Per   Brief Narrative:  [name] is an 84 y/o with dementia, RA, CAD s/p stents, mild d CHF (grade 1), CVA, DVT, GERD,hypothyroid, c diff colitis,  CKD 3 brought from [facility] for dyspnea, weakness and found to be hypoxic with pulse ox of 83%, febrile with temp of 101.6.  Noted to have unilateral crackles in ED although no confirmed pneumonia on imaging, had high suspicion for pneumonia.   Subjective: She has no complaints today.     Assessment & Plan:   Principal Problem:   HCAP (healthcare-associated pneumonia)/ UTI/ Sepsis  - Cipro mentioned on Med rec to have been started for UTI - MRSA PCR neg- d/c Vanc - unfortunately urine culture not sent- appears she was started on Cipro at the SNF for this - blood culture negative - will treat her for pneumonia for now and repeat a CXR (PA/Lat) - discussed plan with daughter - 12/16> repeat CXR shows LLL infiltrate consistent with crackles on exam- had low grade fever last night ? Viral pneumonia- check resp virus panel- cont Cefepime for HCAP   Active Problems: AKI/ CKD 3 - Cr up to 2.08 from 1.56- holding lasix and Vasotec  - Cr elevation persists- urine output documented as 400 from yesterday- can give a small bolus and slow IVF to see if this helps    RA (rheumatoid arthritis)   - Prednisone  HTN - Clonidine PRN, Lopressor BID- holding Vasotec and Lasix- BP stable  Hypothyoroid - Synthroid     Anemia of chronic disease - Hb in the past ranging from 9-11- currently about 8-9 - down to 7.7 today- anemia panel consistent with AOCD- no bloody stools - may have anemia of acute illness - hold of on transfusion and follow Hb tomorrow    History of DVT (deep vein thrombosis) - on Eliquis   Dementia - not oriented to time, place or situation  DVT prophylaxis: Eliquis Code Status: Full code Family Communication:  daughter Disposition Plan: return to SNF Consultants:   none Procedures:   none Antimicrobials:  Anti-infectives (From admission, onward)   Start     Dose/Rate Route Frequency Ordered Stop   06/13/18 0000  vancomycin (VANCOCIN) 500 mg in sodium chloride 0.9 % 100 mL IVPB  Status:  Discontinued     500 mg 100 mL/hr over 60 Minutes Intravenous Every 36 hours 06/11/18 1127 06/12/18 0906   06/12/18 1200  ceFEPIme (MAXIPIME) 1 g in sodium chloride 0.9 % 100 mL IVPB     1 g 200 mL/hr over 30 Minutes Intravenous Every 24 hours 06/11/18 1127     06/11/18 1000  vancomycin (VANCOCIN) IVPB 1000 mg/200 mL premix     1,000 mg 200 mL/hr over 60 Minutes Intravenous  Once 06/11/18 0956 06/11/18 1304   06/11/18 1000  ceFEPIme (MAXIPIME) 2 g in sodium chloride 0.9 % 100 mL IVPB     2 g 200 mL/hr over 30 Minutes Intravenous  Once 06/11/18 0956 06/11/18 1202       Objective: Vitals:   06/12/18 2115 06/13/18 0550 06/13/18 0554 06/13/18 1011  BP: 139/64 (!) 151/72  129/61  Pulse: 87 72  78  Resp: 16 18  (!) 26  Temp: (!) 100.7 F (38.2 C) 98.1 F (36.7 C) 98.1 F (36.7 C) 98.5 F (36.9 C)  TempSrc: Oral Oral  Oral  SpO2: 93% 95%  95%  Weight:   53.2 kg   Height:        Intake/Output Summary (Last 24 hours) at 06/13/2018 1144 Last data filed at 06/13/2018 0900 Gross per 24 hour  Intake 320 ml  Output 400 ml  Net -80 ml   Filed Weights   06/11/18 0954 06/13/18 0554  Weight: 54.4 kg 53.2 kg    Examination: General exam: Appears comfortable  HEENT: PERRLA, oral mucosa moist, no sclera icterus or thrush Respiratory system: mild crackles in LLL and rhonchi in all lung fields Cardiovascular system: S1 & S2 heard, RRR.   Gastrointestinal system: Abdomen soft, non-tender, nondistended. Normal bowel sounds. Central nervous system: Alert - oriented to person only. No focal neurological deficits. Extremities: No cyanosis, clubbing or edema Skin: No rashes or ulcers     Data  Reviewed: I have personally reviewed following labs and imaging studies  CBC: Recent Labs  Lab 06/11/18 0956 06/12/18 0822 06/13/18 0744  WBC 14.2* 10.9* 11.0*  NEUTROABS 11.8*  --   --   HGB 8.6* 8.0* 7.7*  HCT 29.4* 28.1* 26.7*  MCV 90.5 90.1 89.6  PLT 137* 121* 161*   Basic Metabolic Panel: Recent Labs  Lab 06/11/18 0956 06/12/18 0822 06/13/18 0744  NA 143 141 144  K 4.8 5.1 4.7  CL 111 110 115*  CO2 20* 21* 19*  GLUCOSE 174* 130* 101*  BUN 30* 29* 28*  CREATININE 2.42* 2.23* 2.12*  CALCIUM 8.4* 7.7* 7.7*   GFR: Estimated Creatinine Clearance: 16 mL/min (A) (by C-G formula based on SCr of 2.12 mg/dL (H)). Liver Function Tests: Recent Labs  Lab 06/11/18 0956  AST 23  ALT 22  ALKPHOS 57  BILITOT 0.6  PROT 5.6*  ALBUMIN 2.8*   No results for input(s): LIPASE, AMYLASE in the last 168 hours. No results for input(s): AMMONIA in the last 168 hours. Coagulation Profile: No results for input(s): INR, PROTIME in the last 168 hours. Cardiac Enzymes: No results for input(s): CKTOTAL, CKMB, CKMBINDEX, TROPONINI in the last 168 hours. BNP (last 3 results) No results for input(s): PROBNP in the last 8760 hours. HbA1C: No results for input(s): HGBA1C in the last 72 hours. CBG: No results for input(s): GLUCAP in the last 168 hours. Lipid Profile: No results for input(s): CHOL, HDL, LDLCALC, TRIG, CHOLHDL, LDLDIRECT in the last 72 hours. Thyroid Function Tests: No results for input(s): TSH, T4TOTAL, FREET4, T3FREE, THYROIDAB in the last 72 hours. Anemia Panel: Recent Labs    06/13/18 0914  VITAMINB12 568  FOLATE 13.7  FERRITIN 60  TIBC 316  IRON 20*  RETICCTPCT 2.2   Urine analysis:    Component Value Date/Time   COLORURINE YELLOW 06/11/2018 1217   APPEARANCEUR HAZY (A) 06/11/2018 1217   APPEARANCEUR Hazy 03/06/2013 1013   LABSPEC 1.008 06/11/2018 1217   LABSPEC 1.013 03/06/2013 1013   PHURINE 7.0 06/11/2018 1217   GLUCOSEU NEGATIVE 06/11/2018 1217    GLUCOSEU Negative 03/06/2013 1013   HGBUR SMALL (A) 06/11/2018 1217   BILIRUBINUR NEGATIVE 06/11/2018 1217   BILIRUBINUR Negative 03/06/2013 1013   KETONESUR NEGATIVE 06/11/2018 1217   PROTEINUR NEGATIVE 06/11/2018 1217   UROBILINOGEN 0.2 12/18/2012 1203   NITRITE NEGATIVE 06/11/2018 1217   LEUKOCYTESUR LARGE (A) 06/11/2018 1217   LEUKOCYTESUR 3+ 03/06/2013 1013   Sepsis Labs: @LABRCNTIP (procalcitonin:4,lacticidven:4) ) Recent Results (from the past 240 hour(s))  Blood Culture (routine x 2)     Status: None (Preliminary result)   Collection Time: 06/11/18 10:50 AM  Result Value Ref Range  Status   Specimen Description BLOOD RIGHT ANTECUBITAL  Final   Special Requests   Final    BOTTLES DRAWN AEROBIC AND ANAEROBIC Blood Culture adequate volume   Culture   Final    NO GROWTH 1 DAY Performed at [facility], [address], [place], [place] [address]    Report Status PENDING  Incomplete  Blood Culture (routine x 2)     Status: None (Preliminary result)   Collection Time: 06/11/18 10:50 AM  Result Value Ref Range Status   Specimen Description BLOOD BLOOD RIGHT FOREARM  Final   Special Requests   Final    BOTTLES DRAWN AEROBIC ONLY Blood Culture adequate volume   Culture   Final    NO GROWTH 1 DAY Performed at [facility], [place] [address], [place], [place] [address]    Report Status PENDING  Incomplete  MRSA PCR Screening     Status: None   Collection Time: 06/11/18  9:29 PM  Result Value Ref Range Status   MRSA by PCR NEGATIVE NEGATIVE Final    Comment:        The GeneXpert MRSA Assay (FDA approved for NASAL specimens only), is one component of a comprehensive MRSA colonization surveillance program. It is not intended to diagnose MRSA infection nor to guide or monitor treatment for MRSA infections. Performed at [facility], [place] [address] [place], [place] [address]          Radiology Studies: Dg Chest 2 View  Result Date:  06/12/2018 CLINICAL DATA:  Pneumonia, follow-up EXAM: CHEST - 2 VIEW COMPARISON:  06/11/2018 FINDINGS: Mild enlargement of cardiac silhouette. Large hiatal hernia. Atherosclerotic calcification aorta. Questionable retrocardiac LEFT lower lobe opacity/infiltrate. Minimal LEFT upper lobe infiltrate remaining lungs clear. No pneumothorax. Bones demineralized. IMPRESSION: Hiatal hernia. Questionable LEFT lower lobe infiltrate. Electronically Signed   By: Lavonia Dana M.D.   On: 06/12/2018 20:17      Scheduled Meds: . apixaban  2.5 mg Oral BID  . cycloSPORINE  1 drop Both Eyes BID  . fluticasone  1 spray Each Nare Daily  . levothyroxine  25 mcg Oral QAC breakfast  . loratadine  10 mg Oral Daily  . LORazepam  0.5 mg Oral QHS  . metoprolol tartrate  75 mg Oral BID  . mirtazapine  15 mg Oral QHS  . montelukast  10 mg Oral QHS  . olopatadine  1 drop Both Eyes BID  . pantoprazole  40 mg Oral BID  . predniSONE  10 mg Oral Daily  . rosuvastatin  20 mg Oral QPM  . sertraline  50 mg Oral q morning - 10a   Continuous Infusions: . ceFEPime (MAXIPIME) IV 1 g (06/12/18 1140)     LOS: 2 days    Time spent in minutes: [place], MD [facility] Pager: [email or web address] Password [email or web address] 06/13/2018, 11:44 AM

## 2018-06-14 LAB — URINALYSIS, ROUTINE W REFLEX MICROSCOPIC
Bilirubin Urine: NEGATIVE
Glucose, UA: NEGATIVE mg/dL
Hgb urine dipstick: NEGATIVE
Ketones, ur: NEGATIVE mg/dL
Nitrite: NEGATIVE
Protein, ur: 30 mg/dL — AB
Specific Gravity, Urine: 1.014 (ref 1.005–1.030)
pH: 6 (ref 5.0–8.0)

## 2018-06-14 LAB — BASIC METABOLIC PANEL
Anion gap: 12 (ref 5–15)
BUN: 28 mg/dL — ABNORMAL HIGH (ref 8–23)
CO2: 20 mmol/L — ABNORMAL LOW (ref 22–32)
Calcium: 7.9 mg/dL — ABNORMAL LOW (ref 8.9–10.3)
Chloride: 111 mmol/L (ref 98–111)
Creatinine, Ser: 2.21 mg/dL — ABNORMAL HIGH (ref 0.44–1.00)
GFR calc Af Amer: 23 mL/min — ABNORMAL LOW (ref >60)
GFR calc non Af Amer: 20 mL/min — ABNORMAL LOW (ref >60)
Glucose, Bld: 97 mg/dL (ref 70–99)
Potassium: 4.3 mmol/L (ref 3.5–5.1)
Sodium: 143 mmol/L (ref 135–145)

## 2018-06-14 LAB — CBC
HCT: 26.4 % — ABNORMAL LOW (ref 36.0–46.0)
Hemoglobin: 7.7 g/dL — ABNORMAL LOW (ref 12.0–15.0)
MCH: 25.8 pg — ABNORMAL LOW (ref 26.0–34.0)
MCHC: 29.2 g/dL — AB (ref 30.0–36.0)
MCV: 88.3 fL (ref 80.0–100.0)
Platelets: 125 10*3/uL — ABNORMAL LOW (ref 150–400)
RBC: 2.99 MIL/uL — ABNORMAL LOW (ref 3.87–5.11)
RDW: 17.2 % — ABNORMAL HIGH (ref 11.5–15.5)
WBC: 9.7 10*3/uL (ref 4.0–10.5)
nRBC: 0 % (ref 0.0–0.2)

## 2018-06-14 NOTE — Progress Notes (Signed)
PROGRESS NOTE    [name]   NIO:[id number]  DOB: Feb 13, 1930  DOA: 06/11/2018 PCP: Patient, No Pcp Per   Brief Narrative:  [name] is an 84 y/o chronically ill female with dementia, RA, CAD s/p stents, mild d CHF (grade 1), CVA, DVT, GERD,hypothyroid, c diff colitis,  CKD 3 (per labs in 2018) brought from [facility] for dyspnea, weakness and found to be hypoxic with pulse ox of 83%, febrile with temp of 101.6.  Noted to have unilateral crackles in ED although no confirmed pneumonia on imaging, had high suspicion for pneumonia.   Subjective: She has no complaints today.     Assessment & Plan:   Principal Problem:   HCAP (healthcare-associated pneumonia)/ UTI/ Sepsis  - Cipro mentioned on Med rec to have been started for UTI - MRSA PCR neg- d/c Vanc- cont Cefepime - unfortunately urine culture not sent- appears she was started on Cipro at the SNF for this - blood culture negative - will treat her for pneumonia for now and repeat a CXR (PA/Lat) - discussed plan with daughter - leukocytosis improving - repeat UA to look for improvement as we had no culture  Active Problems:  h/o KD 3   - appears to have progressed  To CKD 4 - Cr up to 2.08- holding lasix  - not fluid overloaded - d/c Vasotec  - does not want dialysis     RA (rheumatoid arthritis)   - Prednisone  HTN - Clonidine PRN, Lopressor BID- holding Vasotec and Lasix- BP stable  Hypothyoroid - Synthroid     Anemia of chronic disease - Hb in the past ranging from 9-11  - down to ~ 7.7 - anemia panel consistent with AOCD- no bloody stools - asymptomatics and no need to transfuse yet   Chronic mild thrombocytopenia - stable    History of DVT (deep vein thrombosis) - on Eliquis   Dementia - not oriented to time, place or situation  DVT prophylaxis: Eliquis Code Status: Full code Family Communication: daughter- spoke with her about sitting down the the PCP to discuss long term plans and goals  of care. Disposition Plan: return to SNF likely tomorrow Consultants:   none Procedures:   none Antimicrobials:  Anti-infectives (From admission, onward)   Start     Dose/Rate Route Frequency Ordered Stop   06/13/18 0000  vancomycin (VANCOCIN) 500 mg in sodium chloride 0.9 % 100 mL IVPB  Status:  Discontinued     500 mg 100 mL/hr over 60 Minutes Intravenous Every 36 hours 06/11/18 1127 06/12/18 0906   06/12/18 1200  ceFEPIme (MAXIPIME) 1 g in sodium chloride 0.9 % 100 mL IVPB     1 g 200 mL/hr over 30 Minutes Intravenous Every 24 hours 06/11/18 1127     06/11/18 1000  vancomycin (VANCOCIN) IVPB 1000 mg/200 mL premix     1,000 mg 200 mL/hr over 60 Minutes Intravenous  Once 06/11/18 0956 06/11/18 1304   06/11/18 1000  ceFEPIme (MAXIPIME) 2 g in sodium chloride 0.9 % 100 mL IVPB     2 g 200 mL/hr over 30 Minutes Intravenous  Once 06/11/18 0956 06/11/18 1202       Objective: Vitals:   06/13/18 1400 06/13/18 1625 06/13/18 2129 06/14/18 0540  BP: (!) 170/97 (!) 156/84 (!) 128/96 (!) 144/92  Pulse: 89 79 76 69  Resp: (!) 40 (!) 24 18 18   Temp: 98.4 F (36.9 C) 98.4 F (36.9 C) 98.2 F (36.8 C) 98.1 F (36.7  C)  TempSrc: Oral Oral Oral Oral  SpO2: 94% 100% 100% 100%  Weight:      Height:        Intake/Output Summary (Last 24 hours) at 06/14/2018 0704 Last data filed at 06/14/2018 0645 Gross per 24 hour  Intake 492.14 ml  Output 1800 ml  Net -1307.86 ml   Filed Weights   06/11/18 0954 06/13/18 0554  Weight: 54.4 kg 53.2 kg    Examination: General exam: Appears comfortable  HEENT: PERRLA, oral mucosa moist, no sclera icterus or thrush Respiratory system: no rhonci or wheezing today.  Cardiovascular system: S1 & S2 heard,  No murmurs  Gastrointestinal system: Abdomen soft, non-tender, nondistended. Normal bowel sound. No organomegaly Central nervous system: Alert and oriented to only self. No focal neurological deficits. Extremities: No cyanosis, clubbing or  edema Skin: No rashes or ulcers Psychiatry:  Mood & affect appropriate.      Data Reviewed: I have personally reviewed following labs and imaging studies  CBC: Recent Labs  Lab 06/11/18 0956 06/12/18 0822 06/13/18 0744 06/14/18 0431  WBC 14.2* 10.9* 11.0* 9.7  NEUTROABS 11.8*  --   --   --   HGB 8.6* 8.0* 7.7* 7.7*  HCT 29.4* 28.1* 26.7* 26.4*  MCV 90.5 90.1 89.6 88.3  PLT 137* 121* 126* 578*   Basic Metabolic Panel: Recent Labs  Lab 06/11/18 0956 06/12/18 0822 06/13/18 0744 06/14/18 0431  NA 143 141 144 143  K 4.8 5.1 4.7 4.3  CL 111 110 115* 111  CO2 20* 21* 19* 20*  GLUCOSE 174* 130* 101* 97  BUN 30* 29* 28* 28*  CREATININE 2.42* 2.23* 2.12* 2.21*  CALCIUM 8.4* 7.7* 7.7* 7.9*   GFR: Estimated Creatinine Clearance: 15.3 mL/min (A) (by C-G formula based on SCr of 2.21 mg/dL (H)). Liver Function Tests: Recent Labs  Lab 06/11/18 0956  AST 23  ALT 22  ALKPHOS 57  BILITOT 0.6  PROT 5.6*  ALBUMIN 2.8*   No results for input(s): LIPASE, AMYLASE in the last 168 hours. No results for input(s): AMMONIA in the last 168 hours. Coagulation Profile: No results for input(s): INR, PROTIME in the last 168 hours. Cardiac Enzymes: No results for input(s): CKTOTAL, CKMB, CKMBINDEX, TROPONINI in the last 168 hours. BNP (last 3 results) No results for input(s): PROBNP in the last 8760 hours. HbA1C: No results for input(s): HGBA1C in the last 72 hours. CBG: No results for input(s): GLUCAP in the last 168 hours. Lipid Profile: No results for input(s): CHOL, HDL, LDLCALC, TRIG, CHOLHDL, LDLDIRECT in the last 72 hours. Thyroid Function Tests: No results for input(s): TSH, T4TOTAL, FREET4, T3FREE, THYROIDAB in the last 72 hours. Anemia Panel: Recent Labs    06/13/18 0914  VITAMINB12 568  FOLATE 13.7  FERRITIN 60  TIBC 316  IRON 20*  RETICCTPCT 2.2   Urine analysis:    Component Value Date/Time   COLORURINE YELLOW 06/11/2018 1217   APPEARANCEUR HAZY (A)  06/11/2018 1217   APPEARANCEUR Hazy 03/06/2013 1013   LABSPEC 1.008 06/11/2018 1217   LABSPEC 1.013 03/06/2013 1013   PHURINE 7.0 06/11/2018 1217   GLUCOSEU NEGATIVE 06/11/2018 1217   GLUCOSEU Negative 03/06/2013 1013   HGBUR SMALL (A) 06/11/2018 1217   BILIRUBINUR NEGATIVE 06/11/2018 1217   BILIRUBINUR Negative 03/06/2013 1013   KETONESUR NEGATIVE 06/11/2018 1217   PROTEINUR NEGATIVE 06/11/2018 1217   UROBILINOGEN 0.2 12/18/2012 1203   NITRITE NEGATIVE 06/11/2018 1217   LEUKOCYTESUR LARGE (A) 06/11/2018 1217   LEUKOCYTESUR 3+ 03/06/2013 1013  Sepsis Labs: @LABRCNTIP (procalcitonin:4,lacticidven:4) ) Recent Results (from the past 240 hour(s))  Blood Culture (routine x 2)     Status: None (Preliminary result)   Collection Time: 06/11/18 10:50 AM  Result Value Ref Range Status   Specimen Description BLOOD RIGHT ANTECUBITAL  Final   Special Requests   Final    BOTTLES DRAWN AEROBIC AND ANAEROBIC Blood Culture adequate volume   Culture   Final    NO GROWTH 1 DAY Performed at [facility], [address] [place], [place] [address]    Report Status PENDING  Incomplete  Blood Culture (routine x 2)     Status: None (Preliminary result)   Collection Time: 06/11/18 10:50 AM  Result Value Ref Range Status   Specimen Description BLOOD BLOOD RIGHT FOREARM  Final   Special Requests   Final    BOTTLES DRAWN AEROBIC ONLY Blood Culture adequate volume   Culture   Final    NO GROWTH 1 DAY Performed at [facility], [place] [address] [place], [place] [address]    Report Status PENDING  Incomplete  MRSA PCR Screening     Status: None   Collection Time: 06/11/18  9:29 PM  Result Value Ref Range Status   MRSA by PCR NEGATIVE NEGATIVE Final    Comment:        The GeneXpert MRSA Assay (FDA approved for NASAL specimens only), is one component of a comprehensive MRSA colonization surveillance program. It is not intended to diagnose MRSA infection nor to guide or monitor  treatment for MRSA infections. Performed at [facility], [place] [address], [place], [place] [address]   Respiratory Panel by PCR     Status: None   Collection Time: 06/13/18 12:14 PM  Result Value Ref Range Status   Adenovirus NOT DETECTED NOT DETECTED Final   Coronavirus 229E NOT DETECTED NOT DETECTED Final   Coronavirus HKU1 NOT DETECTED NOT DETECTED Final   Coronavirus NL63 NOT DETECTED NOT DETECTED Final   Coronavirus OC43 NOT DETECTED NOT DETECTED Final   Metapneumovirus NOT DETECTED NOT DETECTED Final   Rhinovirus / Enterovirus NOT DETECTED NOT DETECTED Final   Influenza A NOT DETECTED NOT DETECTED Final   Influenza B NOT DETECTED NOT DETECTED Final   Parainfluenza Virus 1 NOT DETECTED NOT DETECTED Final   Parainfluenza Virus 2 NOT DETECTED NOT DETECTED Final   Parainfluenza Virus 3 NOT DETECTED NOT DETECTED Final   Parainfluenza Virus 4 NOT DETECTED NOT DETECTED Final   Respiratory Syncytial Virus NOT DETECTED NOT DETECTED Final   Bordetella pertussis NOT DETECTED NOT DETECTED Final   Chlamydophila pneumoniae NOT DETECTED NOT DETECTED Final   Mycoplasma pneumoniae NOT DETECTED NOT DETECTED Final    Comment: Performed at [facility], [place] [address], [place], [place] [address]         Radiology Studies: Dg Chest 2 View  Result Date: 06/12/2018 CLINICAL DATA:  Pneumonia, follow-up EXAM: CHEST - 2 VIEW COMPARISON:  06/11/2018 FINDINGS: Mild enlargement of cardiac silhouette. Large hiatal hernia. Atherosclerotic calcification aorta. Questionable retrocardiac LEFT lower lobe opacity/infiltrate. Minimal LEFT upper lobe infiltrate remaining lungs clear. No pneumothorax. Bones demineralized. IMPRESSION: Hiatal hernia. Questionable LEFT lower lobe infiltrate. Electronically Signed   By: Lavonia Dana M.D.   On: 06/12/2018 20:17   Dg Chest Port 1 View  Result Date: 06/13/2018 CLINICAL DATA:  Difficulty breathing.  Respiratory failure. EXAM: PORTABLE CHEST 1 VIEW  COMPARISON:  June 12, 2018 FINDINGS: Large hiatal hernia. The  cardiomediastinal silhouette is stable. No pneumothorax. No pulmonary nodules or masses. No focal infiltrates. IMPRESSION: No acute interval change. Electronically Signed   By: Dorise Bullion III M.D   On: 06/13/2018 15:18      Scheduled Meds: . apixaban  2.5 mg Oral BID  . cycloSPORINE  1 drop Both Eyes BID  . fluticasone  1 spray Each Nare Daily  . levothyroxine  25 mcg Oral QAC breakfast  . loratadine  10 mg Oral Daily  . LORazepam  0.5 mg Oral QHS  . metoprolol tartrate  75 mg Oral BID  . mirtazapine  15 mg Oral QHS  . montelukast  10 mg Oral QHS  . olopatadine  1 drop Both Eyes BID  . pantoprazole  40 mg Oral BID  . predniSONE  10 mg Oral Daily  . rosuvastatin  20 mg Oral QPM  . sertraline  50 mg Oral q morning - 10a   Continuous Infusions: . ceFEPime (MAXIPIME) IV 1 g (06/13/18 1204)     LOS: 3 days    Time spent in minutes: [place], MD [facility] Pager: [email or web address] Password [email or web address] 06/14/2018, 7:04 AM

## 2018-06-14 NOTE — Evaluation (Signed)
Physical Therapy Evaluation Patient Details Name: [name] MRN: [id number] DOB: [birth year] Today's Date: 06/14/2018   History of Present Illness  Pt is an 84 y/o female who presents from SNF with SOB. She was found to have HCAP  Clinical Impression  Pt admitted with above diagnosis. Pt currently with functional limitations due to the deficits listed below (see PT Problem List). At the time of PT eval pt was able to perform transfer to/from EOB with heavy mod assist. Pt self-limiting, stating she cannot breathe once EOB. Supplemental O2 was reapplied and sats at 96%. PT instructed pt in incentive spirometer use. She will require someone to be with her for repeated instruction for future use. Return to SNF is appropriate. Acutely, pt will benefit from skilled PT to increase their independence and safety with mobility to allow discharge to the venue listed below.       Follow Up Recommendations SNF;Supervision/Assistance - 24 hour    Equipment Recommendations  None recommended by PT    Recommendations for Other Services       Precautions / Restrictions Precautions Precautions: Fall Precaution Comments: watch O2 Restrictions Weight Bearing Restrictions: No      Mobility  Bed Mobility Overal bed mobility: Needs Assistance Bed Mobility: Supine to Sit;Sit to Supine     Supine to sit: Mod assist Sit to supine: Mod assist   General bed mobility comments: HOB elevated ~40 degrees. Pt was able to transition to EOB with heavy mod assist. Flexed trunk in sitting, and pt immediately asking to lay back down stating "I can't breathe, I can't breathe." Supplemental O2 reapplied (pt on RA upon PT arrival) and sats checked. Pt at 96% on 2L/min supplemental O2.   Transfers                 General transfer comment: Unable to progress mobility at this time.   Ambulation/Gait                Stairs            Wheelchair Mobility    Modified Rankin (Stroke Patients  Only)       Balance Overall balance assessment: Needs assistance Sitting-balance support: Feet unsupported;Bilateral upper extremity supported Sitting balance-Leahy Scale: Poor Sitting balance - Comments: Pt leaning on elevated HOB to support trunk  Postural control: Posterior lean;Left lateral lean                                   Pertinent Vitals/Pain Pain Assessment: No/denies pain    Home Living Family/patient expects to be discharged to:: Skilled nursing facility                 Additional Comments: Long-term care resident of [facility] x6 years    Prior Function           Comments: Unsure of PLOF. Pt reports she does not use a cane or walker to ambulate, however noted from prior admission pt was primarily using the wheelchair for mobility. Pt does state that she requires assist for bathing and dressing from staff.      Hand Dominance   Dominant Hand: Right    Extremity/Trunk Assessment   Upper Extremity Assessment Upper Extremity Assessment: Generalized weakness;LUE deficits/detail LUE Deficits / Details: Noted weakness and decreased AROM consistent with prior stroke. Fingers flexed into a fist at rest, but pt was able to extend thumb, second and third fingers  to grasp therapist's hand to test grip strength    Lower Extremity Assessment Lower Extremity Assessment: Generalized weakness;LLE deficits/detail LLE Deficits / Details: Decreased strength and AROM consistent with prior stroke. Was able to elevate off bed slightly    Cervical / Trunk Assessment Cervical / Trunk Assessment: Other exceptions Cervical / Trunk Exceptions: Forward head posture with rounded shoulders. Significant trunk flexion in sitting.   Communication   Communication: HOH  Cognition Arousal/Alertness: Awake/alert Behavior During Therapy: WFL for tasks assessed/performed Overall Cognitive Status: History of cognitive impairments - at baseline                                         General Comments      Exercises     Assessment/Plan    PT Assessment Patient needs continued PT services  PT Problem List Decreased strength;Decreased activity tolerance;Decreased balance;Decreased mobility;Decreased cognition;Decreased knowledge of use of DME;Decreased safety awareness;Decreased knowledge of precautions;Cardiopulmonary status limiting activity       PT Treatment Interventions DME instruction;Gait training;Functional mobility training;Therapeutic activities;Therapeutic exercise;Neuromuscular re-education;Patient/family education    PT Goals (Current goals can be found in the Care Plan section)  Acute Rehab PT Goals Patient Stated Goal: Pt did not state goals during session PT Goal Formulation: Patient unable to participate in goal setting Time For Goal Achievement: 06/28/18 Potential to Achieve Goals: Fair    Frequency Min 2X/week   Barriers to discharge        Co-evaluation               AM-PAC PT "6 Clicks" Mobility  Outcome Measure Help needed turning from your back to your side while in a flat bed without using bedrails?: A Lot Help needed moving from lying on your back to sitting on the side of a flat bed without using bedrails?: A Lot Help needed moving to and from a bed to a chair (including a wheelchair)?: Total Help needed standing up from a chair using your arms (e.g., wheelchair or bedside chair)?: Total Help needed to walk in hospital room?: Total Help needed climbing 3-5 steps with a railing? : Total 6 Click Score: 8    End of Session Equipment Utilized During Treatment: Oxygen Activity Tolerance: Patient limited by fatigue Patient left: in bed;with call bell/phone within reach Nurse Communication: Mobility status;Other (comment)(Purewick needs to be reapplied) PT Visit Diagnosis: Other abnormalities of gait and mobility (R26.89)    Time: 5427-0623 PT Time Calculation (min) (ACUTE ONLY): 13  min   Charges:   PT Evaluation $PT Eval Moderate Complexity: 1 Mod          Rolinda Roan, PT, DPT Acute Rehabilitation Services Pager: [phone] Office: [phone]   Thelma Comp 06/14/2018, 12:52 PM

## 2018-06-15 DIAGNOSIS — A310 Pulmonary mycobacterial infection: Secondary | ICD-10-CM | POA: Diagnosis not present

## 2018-06-15 DIAGNOSIS — F329 Major depressive disorder, single episode, unspecified: Secondary | ICD-10-CM | POA: Diagnosis not present

## 2018-06-15 DIAGNOSIS — R279 Unspecified lack of coordination: Secondary | ICD-10-CM | POA: Diagnosis not present

## 2018-06-15 DIAGNOSIS — M6281 Muscle weakness (generalized): Secondary | ICD-10-CM | POA: Diagnosis not present

## 2018-06-15 DIAGNOSIS — J9601 Acute respiratory failure with hypoxia: Secondary | ICD-10-CM

## 2018-06-15 DIAGNOSIS — R2689 Other abnormalities of gait and mobility: Secondary | ICD-10-CM | POA: Diagnosis not present

## 2018-06-15 DIAGNOSIS — R1312 Dysphagia, oropharyngeal phase: Secondary | ICD-10-CM | POA: Diagnosis not present

## 2018-06-15 DIAGNOSIS — E440 Moderate protein-calorie malnutrition: Secondary | ICD-10-CM | POA: Diagnosis not present

## 2018-06-15 DIAGNOSIS — R509 Fever, unspecified: Secondary | ICD-10-CM | POA: Diagnosis not present

## 2018-06-15 DIAGNOSIS — Z86718 Personal history of other venous thrombosis and embolism: Secondary | ICD-10-CM | POA: Diagnosis not present

## 2018-06-15 DIAGNOSIS — I70235 Atherosclerosis of native arteries of right leg with ulceration of other part of foot: Secondary | ICD-10-CM | POA: Diagnosis not present

## 2018-06-15 DIAGNOSIS — A419 Sepsis, unspecified organism: Principal | ICD-10-CM

## 2018-06-15 DIAGNOSIS — N184 Chronic kidney disease, stage 4 (severe): Secondary | ICD-10-CM

## 2018-06-15 DIAGNOSIS — Z7189 Other specified counseling: Secondary | ICD-10-CM | POA: Diagnosis not present

## 2018-06-15 DIAGNOSIS — I252 Old myocardial infarction: Secondary | ICD-10-CM | POA: Diagnosis not present

## 2018-06-15 DIAGNOSIS — R5383 Other fatigue: Secondary | ICD-10-CM | POA: Diagnosis not present

## 2018-06-15 DIAGNOSIS — E782 Mixed hyperlipidemia: Secondary | ICD-10-CM | POA: Diagnosis not present

## 2018-06-15 DIAGNOSIS — R41841 Cognitive communication deficit: Secondary | ICD-10-CM | POA: Diagnosis not present

## 2018-06-15 DIAGNOSIS — J189 Pneumonia, unspecified organism: Secondary | ICD-10-CM | POA: Diagnosis not present

## 2018-06-15 DIAGNOSIS — M069 Rheumatoid arthritis, unspecified: Secondary | ICD-10-CM

## 2018-06-15 DIAGNOSIS — R278 Other lack of coordination: Secondary | ICD-10-CM | POA: Diagnosis not present

## 2018-06-15 DIAGNOSIS — E039 Hypothyroidism, unspecified: Secondary | ICD-10-CM | POA: Diagnosis not present

## 2018-06-15 DIAGNOSIS — N189 Chronic kidney disease, unspecified: Secondary | ICD-10-CM | POA: Diagnosis not present

## 2018-06-15 DIAGNOSIS — F411 Generalized anxiety disorder: Secondary | ICD-10-CM | POA: Diagnosis not present

## 2018-06-15 DIAGNOSIS — N390 Urinary tract infection, site not specified: Secondary | ICD-10-CM | POA: Diagnosis not present

## 2018-06-15 DIAGNOSIS — D649 Anemia, unspecified: Secondary | ICD-10-CM | POA: Diagnosis not present

## 2018-06-15 DIAGNOSIS — F419 Anxiety disorder, unspecified: Secondary | ICD-10-CM | POA: Diagnosis not present

## 2018-06-15 DIAGNOSIS — I509 Heart failure, unspecified: Secondary | ICD-10-CM | POA: Diagnosis not present

## 2018-06-15 DIAGNOSIS — J99 Respiratory disorders in diseases classified elsewhere: Secondary | ICD-10-CM | POA: Diagnosis not present

## 2018-06-15 DIAGNOSIS — F339 Major depressive disorder, recurrent, unspecified: Secondary | ICD-10-CM | POA: Diagnosis not present

## 2018-06-15 DIAGNOSIS — Z743 Need for continuous supervision: Secondary | ICD-10-CM | POA: Diagnosis not present

## 2018-06-15 MED ORDER — CEFUROXIME AXETIL 500 MG PO TABS: 500.0000 mg | ORAL_TABLET | Freq: Two times a day (BID) | ORAL | 0 refills | Status: AC

## 2018-06-15 MED ORDER — HYDROCODONE-ACETAMINOPHEN 5-325 MG PO TABS: 1.0000 | ORAL_TABLET | Freq: Every day | ORAL | 0 refills | Status: AC

## 2018-06-15 MED ORDER — LORAZEPAM 0.5 MG PO TABS: 0.5000 mg | ORAL_TABLET | Freq: Every day | ORAL | 0 refills | Status: AC

## 2018-06-15 NOTE — Clinical Social Work Note (Signed)
Patient medically stable for discharge and will return to [facility] today, transported by ambulance. Admissions director at [facility]) contacted, advised of readiness for discharge and d/c clinicals transmitted to facility. Son Emilie Carp [phone]) contacted and informed of discharge. CSW signing off as no other SW intervention services needed.  Sherree Shankman Givens, MSW, LCSW Licensed Clinical Social Worker [place] [phone]

## 2018-06-15 NOTE — Consult Note (Signed)
            [facility] CM Primary Care Navigator  06/15/2018  [name] 02-27-1930 [id number]   Went to see patient at the bedside to identify possible discharge needs but Inpatient CM states that patient is anticipatingto return back to long term care facility [facility]).  Per MD note, patient was brought from the facility due to dyspnea, weakness, found to be hypoxic with pulse ox of 83% and febrile with temp of 101.6. (sepsis due to healthcare associated pneumonia and possibly UTI)  Per Inpatient social worker note, patient's son [name]) confirmed that patient is a resident of [name] for about 7 years in June and will return there at discharge.   Notedno furtherhealth management needs identifiable atthis point.   For additional questions please contact:  Edwena Felty A. Dierre Crevier, BSN, RN-BC [facility] PRIMARY CARE Navigator Cell: [phone]

## 2018-06-15 NOTE — Progress Notes (Signed)
Report called to nurse at [facility].  All belongings packed to go with pt. Family and pt are aware of transfer.  Will await PTAR for transfer to facility.

## 2018-06-15 NOTE — Discharge Summary (Addendum)
Physician Discharge Summary  [name] NKN:[id number] DOB: 1929/08/23 DOA: 06/11/2018  PCP: Patient, No Pcp Per- PCP at SNF  Admit date: 06/11/2018 Discharge date: 06/15/2018  Admitted From: SNF  Disposition:  SNF   Recommendations for Outpatient Follow-up:  1. Have spoken with son about having a goals of care conversation with PCP at facility in regards to DNR and overall aggressive treatments in the future considering her CKD4, severe dementia, rheumatoid arthritis and inability to ambulate now- consider a palliative care consult please  Discharge Condition:  stable   CODE STATUS:  Full code   Diet recommendation:  Heart healthy Consultations:  none    Discharge Diagnoses:  Principal Problem:   Acute hypoxic resp failure/ HCAP (healthcare-associated pneumonia)   Sepsis Active Problems:   RA (rheumatoid arthritis)     Depression   CKD 4   HLD (hyperlipidemia)   Coronary artery disease   Anemia of chronic disease   History of DVT (deep vein thrombosis)   Glaucoma      Brief Summary: [name] is an 84 y/o chronically ill female who resided at SNF with dementia, RA, CAD s/p stents, mild d CHF (grade 1), CVA, DVT, GERD,hypothyroid, c diff colitis,  CKD 3 (per labs in 2018) brought from [facility] for dyspnea, weakness and found to be hypoxic with pulse ox of 83%, febrile with temp of 101.6.  Noted to have unilateral crackles in ED although no confirmed pneumonia on imaging, had high suspicion for pneumonia.  Hospital Course:  Principal Problem: Sepsis due to HCAP and possibly a UTI - Cipro mentioned on Med rec appeared to have been started for UTI at SNF - Started on Vanc and Cefepime - LLL crackles on admission and possible LEFT lower lobe infiltrate on repeat CT on 12/14 - MRSA PCR neg- d/c'd Vanc- continued on Cefepime - blood culture negative - leukocytosis improving with currently treatment- hypoxia and fever resolved - will d/c back to SNF with oral  antibiotics  Active Problems: Possible UTI - unfortunately urine culture not sent - appears she was started on Cipro at the SNF for this- UA has been repeated yesterday and is shows improvement   h/o CKD 3   - appears to have progressed to CKD 4 - Cr up to 2.08 from 1.89 last year - holding lasix  - not fluid overloaded (normal pulse ox and lungs clear) - d/c'd Vasotec due to CKD 4 - she has told her family in the past that she does not want dialysis     RA (rheumatoid arthritis)   - on Prednisone- no longer able to walk  HTN - Clonidine PRN, Lopressor BID- holding Vasotec and Lasix- BP stable  Hypothyoroid - Synthroid     Anemia of chronic disease - Hb has been running 7-8 range  - anemia panel consistent with AOCD- no bloody stools - asymptomatics and no need to transfuse yet   Chronic mild thrombocytopenia - stable    History of DVT (deep vein thrombosis) - on Eliquis   Dementia - not oriented to time, place or situation  CAD - h/o stents - on Eliquis and statin  Discharge Exam: Vitals:   06/15/18 0720 06/15/18 1008  BP: (!) 145/79   Pulse: 79 82  Resp: 18   Temp: 98.3 F (36.8 C)   SpO2: 98%    Vitals:   06/14/18 2151 06/15/18 0423 06/15/18 0720 06/15/18 1008  BP: (!) 143/65 (!) 151/77 (!) 145/79   Pulse: 80 72 79  82  Resp: 18 16 18    Temp: 97.8 F (36.6 C) 98.2 F (36.8 C) 98.3 F (36.8 C)   TempSrc: Oral  Oral   SpO2: 100% 100% 98%   Weight:      Height:        General: Pt is alert, awake, not in acute distress -  oriented only to self Cardiovascular: RRR, S1/S2 +  Respiratory: CTA bilaterally, no wheezing, no rhonchi- pulse ox 99% on room air Abdominal: Soft, NT, ND, bowel sounds + Extremities: no edema, no cyanosis   Discharge Instructions  Discharge Instructions    Diet - low sodium heart healthy   Complete by:  As directed    Increase activity slowly   Complete by:  As directed      Allergies as of 06/15/2018       Reactions   Codeine Swelling   Sulfa Antibiotics Other (See Comments)   GI upset   Biphosphate Other (See Comments)   Morphine And Related Other (See Comments)   Percocet [oxycodone-acetaminophen] Other (See Comments)   Plavix [clopidogrel Bisulfate] Itching, Rash      Medication List    STOP taking these medications   ciprofloxacin 500 MG tablet Commonly known as:  CIPRO   enalapril 2.5 MG tablet Commonly known as:  VASOTEC   furosemide 40 MG tablet Commonly known as:  LASIX   nitrofurantoin (macrocrystal-monohydrate) 100 MG capsule Commonly known as:  MACROBID   OXYGEN   potassium chloride SA 20 MEQ tablet Commonly known as:  K-DUR,KLOR-CON     TAKE these medications   acetaminophen 325 MG tablet Commonly known as:  TYLENOL Take 650 mg by mouth every 6 (six) hours as needed for moderate pain or fever.   apixaban 2.5 MG Tabs tablet Commonly known as:  ELIQUIS Take 1 tablet (2.5 mg total) by mouth 2 (two) times daily. What changed:  when to take this   BIOFREEZE 4 % Gel Generic drug:  Menthol (Topical Analgesic) Apply 1 application topically 2 (two) times daily. For wrist pain and shoulder pain   carboxymethylcellulose 1 % ophthalmic solution Place 1 drop into both eyes 3 (three) times daily.   cefUROXime 500 MG tablet Commonly known as:  CEFTIN Take 1 tablet (500 mg total) by mouth 2 (two) times daily for 10 days.   CERTAVITE/ANTIOXIDANTS PO Take 1 tablet by mouth daily.   cetirizine 10 MG tablet Commonly known as:  ZYRTEC Take 10 mg by mouth daily.   cloNIDine 0.1 MG tablet Commonly known as:  CATAPRES Take 0.1 mg by mouth every 8 (eight) hours as needed (for SBP>180 or DBP>100).   Cranberry 400 MG Caps Take 400 mg by mouth 2 (two) times daily.   cycloSPORINE 0.05 % ophthalmic emulsion Commonly known as:  RESTASIS Place 1 drop into both eyes 2 (two) times daily.   denosumab 60 MG/ML Soln injection Commonly known as:  PROLIA Inject 60 mg into  the skin every 6 (six) months. Administer in upper arm, thigh, or abdomen   diclofenac sodium 1 % Gel Commonly known as:  VOLTAREN Apply 2-4 g topically 3 (three) times daily. Apply to lower back for pain   esomeprazole 40 MG capsule Commonly known as:  NEXIUM Take 40 mg by mouth 2 (two) times daily before a meal.   fluticasone 50 MCG/ACT nasal spray Commonly known as:  FLONASE Place 1 spray into both nostrils daily.   HYDROcodone-acetaminophen 5-325 MG tablet Commonly known as:  NORCO/VICODIN Take 1 tablet  by mouth daily. What changed:  when to take this   ipratropium-albuterol 0.5-2.5 (3) MG/3ML Soln Commonly known as:  DUONEB Take 3 mLs by nebulization every 8 (eight) hours as needed (wheezing).   levothyroxine 25 MCG tablet Commonly known as:  SYNTHROID, LEVOTHROID Take 1 tablet (25 mcg total) by mouth daily before breakfast.   ASPERCREME LIDOCAINE 4 % Ptch Generic drug:  Lidocaine Apply 2 patches topically See admin instructions. Apply one patch to lower back and one patch to right shoulder each morning. Remove patch after 12 hrs (on am, off pm)   lidocaine 5 % Commonly known as:  LIDODERM Place 1 patch onto the skin daily. Remove & Discard patch within 12 hours or as directed by MD   loperamide 2 MG capsule Commonly known as:  IMODIUM Take 2 mg by mouth See admin instructions. Give 2 capsules (4mg ) initially followed by 2mg  after each loose stool. Do not give if less than 3 stools or resident has cdif/norovirus dx. Do not exceed more than 16mg  in a 24 hr period.   LORazepam 0.5 MG tablet Commonly known as:  ATIVAN Take 1 tablet (0.5 mg total) by mouth at bedtime. Take one tablet by mouth every night at bedtime for anxiety   meclizine 12.5 MG tablet Commonly known as:  ANTIVERT Take 12.5 mg by mouth 2 (two) times daily.   metoprolol tartrate 25 MG tablet Commonly known as:  LOPRESSOR Take 75 mg by mouth 2 (two) times daily.   mirtazapine 15 MG tablet Commonly  known as:  REMERON Take 15 mg by mouth at bedtime.   montelukast 10 MG tablet Commonly known as:  SINGULAIR Take 10 mg by mouth at bedtime.   nitroGLYCERIN 0.4 MG SL tablet Commonly known as:  NITROSTAT Place 0.4 mg under the tongue every 5 (five) minutes as needed for chest pain.   ondansetron 4 MG disintegrating tablet Commonly known as:  ZOFRAN ODT Take 1 tablet (4 mg total) by mouth every 8 (eight) hours as needed for nausea or vomiting.   PATADAY 0.2 % Soln Generic drug:  Olopatadine HCl Place 1 drop into both eyes daily.   predniSONE 10 MG tablet Commonly known as:  DELTASONE Take 10 mg by mouth daily. For RA   REFRESH OPTIVE ADVANCED 0.5-1-0.5 % Soln Generic drug:  Carboxymeth-Glycerin-Polysorb Place 1 drop into both eyes 4 (four) times daily.   rosuvastatin 20 MG tablet Commonly known as:  CRESTOR Take 20 mg by mouth every evening. For hyperlipidemia   sertraline 50 MG tablet Commonly known as:  ZOLOFT Take 50 mg by mouth every morning.   Vitamin D 50 MCG (2000 UT) Caps Take 1 capsule (2,000 Units total) by mouth daily.       Allergies  Allergen Reactions  . Codeine Swelling  . Sulfa Antibiotics Other (See Comments)    GI upset  . Biphosphate Other (See Comments)  . Morphine And Related Other (See Comments)  . Percocet [Oxycodone-Acetaminophen] Other (See Comments)  . Plavix [Clopidogrel Bisulfate] Itching and Rash     Procedures/Studies:    Dg Chest 2 View  Result Date: 06/12/2018 CLINICAL DATA:  Pneumonia, follow-up EXAM: CHEST - 2 VIEW COMPARISON:  06/11/2018 FINDINGS: Mild enlargement of cardiac silhouette. Large hiatal hernia. Atherosclerotic calcification aorta. Questionable retrocardiac LEFT lower lobe opacity/infiltrate. Minimal LEFT upper lobe infiltrate remaining lungs clear. No pneumothorax. Bones demineralized. IMPRESSION: Hiatal hernia. Questionable LEFT lower lobe infiltrate. Electronically Signed   By: Lavonia Dana M.D.   On: 06/12/2018  20:17   Dg Chest Port 1 View  Result Date: 06/13/2018 CLINICAL DATA:  Difficulty breathing.  Respiratory failure. EXAM: PORTABLE CHEST 1 VIEW COMPARISON:  June 12, 2018 FINDINGS: Large hiatal hernia. The cardiomediastinal silhouette is stable. No pneumothorax. No pulmonary nodules or masses. No focal infiltrates. IMPRESSION: No acute interval change. Electronically Signed   By: Dorise Bullion III M.D   On: 06/13/2018 15:18   Dg Chest Port 1 View  Result Date: 06/11/2018 CLINICAL DATA:  Cough and shortness of breath EXAM: PORTABLE CHEST 1 VIEW COMPARISON:  03/03/2017 FINDINGS: Cardiac shadow remains enlarged. Aortic calcifications are noted. Hiatal hernia is again identified and stable. The lungs are hypoinflated but clear. Fixation hardware is noted in the thoracic spine with discontinuity of the posterior fixation rod on the right in the mid to lower thoracic spine. These changes are stable over multiple previous exams. No new bony abnormality is seen. IMPRESSION: No acute abnormality noted.  Poor inspiratory effort is noted. Electronically Signed   By: Inez Catalina M.D.   On: 06/11/2018 11:07     The results of significant diagnostics from this hospitalization (including imaging, microbiology, ancillary and laboratory) are listed below for reference.     Microbiology: Recent Results (from the past 240 hour(s))  Blood Culture (routine x 2)     Status: None (Preliminary result)   Collection Time: 06/11/18 10:50 AM  Result Value Ref Range Status   Specimen Description BLOOD RIGHT ANTECUBITAL  Final   Special Requests   Final    BOTTLES DRAWN AEROBIC AND ANAEROBIC Blood Culture adequate volume   Culture   Final    NO GROWTH 4 DAYS Performed at [facility], [address] [place], [place] [address]    Report Status PENDING  Incomplete  Blood Culture (routine x 2)     Status: None (Preliminary result)   Collection Time: 06/11/18 10:50 AM  Result Value Ref Range Status    Specimen Description BLOOD BLOOD RIGHT FOREARM  Final   Special Requests   Final    BOTTLES DRAWN AEROBIC ONLY Blood Culture adequate volume   Culture   Final    NO GROWTH 4 DAYS Performed at [facility], [place] [address], [place], [place] [address]    Report Status PENDING  Incomplete  MRSA PCR Screening     Status: None   Collection Time: 06/11/18  9:29 PM  Result Value Ref Range Status   MRSA by PCR NEGATIVE NEGATIVE Final    Comment:        The GeneXpert MRSA Assay (FDA approved for NASAL specimens only), is one component of a comprehensive MRSA colonization surveillance program. It is not intended to diagnose MRSA infection nor to guide or monitor treatment for MRSA infections. Performed at [facility], [place] [address], [place], [place] [address]   Respiratory Panel by PCR     Status: None   Collection Time: 06/13/18 12:14 PM  Result Value Ref Range Status   Adenovirus NOT DETECTED NOT DETECTED Final   Coronavirus 229E NOT DETECTED NOT DETECTED Final   Coronavirus HKU1 NOT DETECTED NOT DETECTED Final   Coronavirus NL63 NOT DETECTED NOT DETECTED Final   Coronavirus OC43 NOT DETECTED NOT DETECTED Final   Metapneumovirus NOT DETECTED NOT DETECTED Final   Rhinovirus / Enterovirus NOT DETECTED NOT DETECTED Final   Influenza A NOT DETECTED NOT DETECTED Final   Influenza B NOT DETECTED NOT DETECTED Final   Parainfluenza Virus 1 NOT  DETECTED NOT DETECTED Final   Parainfluenza Virus 2 NOT DETECTED NOT DETECTED Final   Parainfluenza Virus 3 NOT DETECTED NOT DETECTED Final   Parainfluenza Virus 4 NOT DETECTED NOT DETECTED Final   Respiratory Syncytial Virus NOT DETECTED NOT DETECTED Final   Bordetella pertussis NOT DETECTED NOT DETECTED Final   Chlamydophila pneumoniae NOT DETECTED NOT DETECTED Final   Mycoplasma pneumoniae NOT DETECTED NOT DETECTED Final    Comment: Performed at [facility], [place] [address] [place], [place] [address]     Labs: BNP (last 3  results) Recent Labs    06/11/18 0956  BNP 6,045.4*   Basic Metabolic Panel: Recent Labs  Lab 06/11/18 0956 06/12/18 0822 06/13/18 0744 06/14/18 0431  NA 143 141 144 143  K 4.8 5.1 4.7 4.3  CL 111 110 115* 111  CO2 20* 21* 19* 20*  GLUCOSE 174* 130* 101* 97  BUN 30* 29* 28* 28*  CREATININE 2.42* 2.23* 2.12* 2.21*  CALCIUM 8.4* 7.7* 7.7* 7.9*   Liver Function Tests: Recent Labs  Lab 06/11/18 0956  AST 23  ALT 22  ALKPHOS 57  BILITOT 0.6  PROT 5.6*  ALBUMIN 2.8*   No results for input(s): LIPASE, AMYLASE in the last 168 hours. No results for input(s): AMMONIA in the last 168 hours. CBC: Recent Labs  Lab 06/11/18 0956 06/12/18 0822 06/13/18 0744 06/14/18 0431  WBC 14.2* 10.9* 11.0* 9.7  NEUTROABS 11.8*  --   --   --   HGB 8.6* 8.0* 7.7* 7.7*  HCT 29.4* 28.1* 26.7* 26.4*  MCV 90.5 90.1 89.6 88.3  PLT 137* 121* 126* 125*   Cardiac Enzymes: No results for input(s): CKTOTAL, CKMB, CKMBINDEX, TROPONINI in the last 168 hours. BNP: Invalid input(s): POCBNP CBG: No results for input(s): GLUCAP in the last 168 hours. D-Dimer No results for input(s): DDIMER in the last 72 hours. Hgb A1c No results for input(s): HGBA1C in the last 72 hours. Lipid Profile No results for input(s): CHOL, HDL, LDLCALC, TRIG, CHOLHDL, LDLDIRECT in the last 72 hours. Thyroid function studies No results for input(s): TSH, T4TOTAL, T3FREE, THYROIDAB in the last 72 hours.  Invalid input(s): FREET3 Anemia work up Recent Labs    06/13/18 0914  VITAMINB12 568  FOLATE 13.7  FERRITIN 60  TIBC 316  IRON 20*  RETICCTPCT 2.2   Urinalysis    Component Value Date/Time   COLORURINE YELLOW 06/14/2018 1634   APPEARANCEUR HAZY (A) 06/14/2018 1634   APPEARANCEUR Hazy 03/06/2013 1013   LABSPEC 1.014 06/14/2018 1634   LABSPEC 1.013 03/06/2013 1013   PHURINE 6.0 06/14/2018 1634   GLUCOSEU NEGATIVE 06/14/2018 1634   GLUCOSEU Negative 03/06/2013 1013   HGBUR NEGATIVE 06/14/2018 [place] 06/14/2018 1634   BILIRUBINUR Negative 03/06/2013 1013   [place] 06/14/2018 1634   PROTEINUR 30 (A) 06/14/2018 1634   UROBILINOGEN 0.2 12/18/2012 1203   NITRITE NEGATIVE 06/14/2018 1634   LEUKOCYTESUR TRACE (A) 06/14/2018 1634   LEUKOCYTESUR 3+ 03/06/2013 1013   Sepsis Labs Invalid input(s): PROCALCITONIN,  WBC,  LACTICIDVEN Microbiology Recent Results (from the past 240 hour(s))  Blood Culture (routine x 2)     Status: None (Preliminary result)   Collection Time: 06/11/18 10:50 AM  Result Value Ref Range Status   Specimen Description BLOOD RIGHT ANTECUBITAL  Final   Special Requests   Final    BOTTLES DRAWN AEROBIC AND ANAEROBIC Blood Culture adequate volume   Culture   Final    NO GROWTH  4 DAYS Performed at [facility], [place] [address], [place], [place] [address]    Report Status PENDING  Incomplete  Blood Culture (routine x 2)     Status: None (Preliminary result)   Collection Time: 06/11/18 10:50 AM  Result Value Ref Range Status   Specimen Description BLOOD BLOOD RIGHT FOREARM  Final   Special Requests   Final    BOTTLES DRAWN AEROBIC ONLY Blood Culture adequate volume   Culture   Final    NO GROWTH 4 DAYS Performed at [facility], [place] [address], [place], [place] [address]    Report Status PENDING  Incomplete  MRSA PCR Screening     Status: None   Collection Time: 06/11/18  9:29 PM  Result Value Ref Range Status   MRSA by PCR NEGATIVE NEGATIVE Final    Comment:        The GeneXpert MRSA Assay (FDA approved for NASAL specimens only), is one component of a comprehensive MRSA colonization surveillance program. It is not intended to diagnose MRSA infection nor to guide or monitor treatment for MRSA infections. Performed at [facility], [place] [address], [place], [place] [address]   Respiratory Panel by PCR     Status: None   Collection Time: 06/13/18 12:14 PM  Result Value Ref Range Status   Adenovirus NOT  DETECTED NOT DETECTED Final   Coronavirus 229E NOT DETECTED NOT DETECTED Final   Coronavirus HKU1 NOT DETECTED NOT DETECTED Final   Coronavirus NL63 NOT DETECTED NOT DETECTED Final   Coronavirus OC43 NOT DETECTED NOT DETECTED Final   Metapneumovirus NOT DETECTED NOT DETECTED Final   Rhinovirus / Enterovirus NOT DETECTED NOT DETECTED Final   Influenza A NOT DETECTED NOT DETECTED Final   Influenza B NOT DETECTED NOT DETECTED Final   Parainfluenza Virus 1 NOT DETECTED NOT DETECTED Final   Parainfluenza Virus 2 NOT DETECTED NOT DETECTED Final   Parainfluenza Virus 3 NOT DETECTED NOT DETECTED Final   Parainfluenza Virus 4 NOT DETECTED NOT DETECTED Final   Respiratory Syncytial Virus NOT DETECTED NOT DETECTED Final   Bordetella pertussis NOT DETECTED NOT DETECTED Final   Chlamydophila pneumoniae NOT DETECTED NOT DETECTED Final   Mycoplasma pneumoniae NOT DETECTED NOT DETECTED Final    Comment: Performed at [facility], [place] [address] [place], [place] [address]     Time coordinating discharge in minutes: 65  SIGNED:   Debbe Odea, MD  [facility] 06/15/2018, 2:35 PM Pager   If 7PM-7AM, please contact night-coverage [email or web address] Password [email or web address]

## 2018-06-15 NOTE — [place] FL2 (Addendum)
[place] MEDICAID FL2 LEVEL OF CARE SCREENING TOOL     IDENTIFICATION  Patient Name: [name] Birthdate: [birth year] Sex: female Admission Date (Current Location): 06/11/2018  [place] and [place] Number:  Kathleen Argue [id number] [place] and Address:  The [place], [place] [address] [place], [place] [address]      Provider Number: [id number]  Attending Physician Name and Address:  Debbe Odea, MD  Relative Name and Phone Number:  Thomasine Klutts - son, [phone] (home), [phone] (mobile)    Current Level of Care: Hospital Recommended Level of Care: Skilled Nursing Facility(From [facility]) Prior Approval Number:    Date Approved/Denied:   PASRR Number:   [id number]  Discharge Plan: SNF    Current Diagnoses: Patient Active Problem List   Diagnosis Date Noted  . Acute lower UTI 06/11/2018  . Glaucoma 06/11/2018  . Diarrhea   . AKI (acute kidney injury) ([place]) 05/24/2017  . Dehydration 05/24/2017  . Chronic nausea 05/24/2017  . Hyperglycemia 11/14/2016  . History of DVT (deep vein thrombosis) 11/13/2016  . Sepsis ([place]) 09/15/2016  . Wrist swelling, left 09/15/2016  . Acute encephalopathy 09/15/2016  . Pain and swelling of left wrist   . UTI (urinary tract infection) 04/08/2016  . Benign paroxysmal positional vertigo 12/19/2015  . Easy bruising 09/20/2015  . CKD (chronic kidney disease) stage 3, GFR 30-59 ml/min (HCC) 07/27/2015  . Anemia of chronic disease 07/27/2015  . Pulmonary hypertension ([place])   . Generalized osteoarthritis 04/11/2015  . Thyroid activity decreased 04/11/2015  . Coronary artery disease   . Obstructive hypertrophic cardiomyopathy ([place])   . Chronic diastolic CHF (congestive heart failure) ([place])   . HCAP (healthcare-associated pneumonia) 11/24/2014  . Vertigo 05/17/2014  . Hemorrhoids 04/24/2014  . Hypothyroidism 12/16/2013  . HLD (hyperlipidemia) 12/16/2013  . Benign hypertensive heart and kidney disease  with diastolic CHF, NYHA class II and CKD stage III ([place]) 12/16/2013  . Allergic rhinitis 07/05/2013  . Acute kidney injury superimposed on CKD ([place]) 01/22/2013  . Anemia 06/03/2012  . Adrenal insufficiency ([place]) 12/08/2011    Class: Chronic  . History of CVA (cerebrovascular accident) 12/07/2011    Class: Acute  . Dysphagia 12/07/2011    Class: Acute  . MI (myocardial infarction) ([place])   . RA (rheumatoid arthritis) ([place])   . PUD (peptic ulcer disease)   . Hiatal hernia with gastroesophageal reflux   . Hiatal hernia   . Osteoporosis   . Depression   . Anxiety     Orientation RESPIRATION BLADDER Height & Weight     Self  O2(2 Liters oxygen) Incontinent Weight: 117 lb 4.6 oz (53.2 kg) Height:  5\' 6"  (167.6 cm)  BEHAVIORAL SYMPTOMS/MOOD NEUROLOGICAL BOWEL NUTRITION STATUS      Continent Diet(Low sodium - heart healthy)  AMBULATORY STATUS COMMUNICATION OF NEEDS Skin   Total Care(Patient was unable to ambulate with PT on 12/16) Verbally Skin abrasions(Abrasions to foot, hip, let, toe)                       Personal Care Assistance Level of Assistance  Bathing, Feeding, Dressing Bathing Assistance: Limited assistance Feeding assistance: Independent Dressing Assistance: Limited assistance     Functional Limitations Info  Sight, Hearing, Speech Sight Info: Adequate Hearing Info: Impaired Speech Info: Adequate    SPECIAL CARE FACTORS FREQUENCY  PT (By licensed PT)     PT Frequency: Evaluated 12/16.  Contractures Contractures Info: Not present    Additional Factors Info  Code Status, Allergies Code Status Info: Full Allergies Info: Codeine, Sulfa Antibiotics, Biphosphate, Morphine And Related, Percocet Oxycodone-acetaminophen, Plavix Clopidogrel Bisulfate           Current Medications (06/15/2018):  This is the current hospital active medication list Current Facility-Administered Medications  Medication Dose Route Frequency Provider Last  Rate Last Dose  . acetaminophen (TYLENOL) tablet 650 mg  650 mg Oral Q6H PRN Reubin Milan, MD       Or  . acetaminophen (TYLENOL) suppository 650 mg  650 mg Rectal Q6H PRN Reubin Milan, MD      . apixaban Arne [place]) tablet 2.5 mg  2.5 mg Oral BID Reubin Milan, MD   2.5 mg at 06/15/18 1008  . ceFEPIme (MAXIPIME) 1 g in sodium chloride 0.9 % 100 mL IVPB  1 g Intravenous Q24H Reubin Milan, MD 200 mL/hr at 06/15/18 1206 1 g at 06/15/18 1206  . cloNIDine (CATAPRES) tablet 0.1 mg  0.1 mg Oral Q8H PRN Reubin Milan, MD      . cycloSPORINE (RESTASIS) 0.05 % ophthalmic emulsion 1 drop  1 drop Both Eyes BID Reubin Milan, MD   1 drop at 06/15/18 1009  . fluticasone (FLONASE) 50 MCG/ACT nasal spray 1 spray  1 spray Each Nare Daily Reubin Milan, MD   1 spray at 06/15/18 1009  . HYDROcodone-acetaminophen (NORCO/VICODIN) 5-325 MG per tablet 1 tablet  1 tablet Oral Q6H PRN Reubin Milan, MD      . ipratropium-albuterol (DUONEB) 0.5-2.5 (3) MG/3ML nebulizer solution 3 mL  3 mL Nebulization Q6H PRN Reubin Milan, MD   3 mL at 06/14/18 2014  . levothyroxine (SYNTHROID, LEVOTHROID) tablet 25 mcg  25 mcg Oral QAC breakfast Reubin Milan, MD   25 mcg at 06/14/18 0602  . loratadine (CLARITIN) tablet 10 mg  10 mg Oral Daily Reubin Milan, MD   10 mg at 06/15/18 1008  . LORazepam (ATIVAN) tablet 0.5 mg  0.5 mg Oral QHS Reubin Milan, MD   0.5 mg at 06/14/18 2209  . metoprolol tartrate (LOPRESSOR) tablet 75 mg  75 mg Oral BID Reubin Milan, MD   75 mg at 06/15/18 1008  . mirtazapine (REMERON) tablet 15 mg  15 mg Oral QHS Reubin Milan, MD   15 mg at 06/14/18 2209  . montelukast (SINGULAIR) tablet 10 mg  10 mg Oral QHS Reubin Milan, MD   10 mg at 06/14/18 2209  . nitroGLYCERIN (NITROSTAT) SL tablet 0.4 mg  0.4 mg Sublingual Q5 min PRN Reubin Milan, MD      . olopatadine (PATANOL) 0.1 % ophthalmic solution 1 drop  1 drop  Both Eyes BID Reubin Milan, MD   1 drop at 06/15/18 1009  . ondansetron (ZOFRAN) tablet 4 mg  4 mg Oral Q6H PRN Reubin Milan, MD       Or  . ondansetron [facility]) injection 4 mg  4 mg Intravenous Q6H PRN Reubin Milan, MD      . pantoprazole (PROTONIX) EC tablet 40 mg  40 mg Oral BID Reubin Milan, MD   40 mg at 06/15/18 1008  . predniSONE (DELTASONE) tablet 10 mg  10 mg Oral Daily Reubin Milan, MD   10 mg at 06/15/18 1008  . rosuvastatin (CRESTOR) tablet 20 mg  20 mg Oral QPM Reubin Milan, MD   20  mg at 06/14/18 1742  . sertraline (ZOLOFT) tablet 50 mg  50 mg Oral q morning - 10a Reubin Milan, MD   50 mg at 06/15/18 1008     Discharge Medications: Please see discharge summary for a list of discharge medications.  Relevant Imaging Results:  Relevant Lab Results:   Additional Information: ss# [id number].    Sable Feil, LCSW

## 2018-06-15 NOTE — Care Management Important Message (Signed)
Important Message  Patient Details  Name: [name] MRN: [id number] Date of Birth: 26-Jun-1930   Medicare Important Message Given:  Yes    Auther Lyerly Montine Circle 06/15/2018, 3:51 PM

## 2018-06-15 NOTE — Progress Notes (Signed)
Pharmacy Antibiotic Note  [name] is a 84 y.o. female admitted on 06/11/2018 with pneumonia.  Pharmacy was initially consulted for vancomycin and cefepime dosing. Vancomycin was discontinued 12/14 due to negative MRSA nasal swab. Patient initially febrile at 101.6 and WBC elevated at 14.2.   Currently afebrile, WBC 9.7, Cr 2.21  Plan: Continue cefepime 1gm IV Q24H F/u renal fxn, C&S, clinical status, and LOT   Height: 5\' 6"  (167.6 cm) Weight: 117 lb 4.6 oz (53.2 kg) IBW/kg (Calculated) : 59.3  Temp (24hrs), Avg:98.7 F (37.1 C), Min:97.8 F (36.6 C), Max:100.3 F (37.9 C)  Recent Labs  Lab 06/11/18 0956 06/11/18 1025 06/11/18 1243 06/12/18 0822 06/13/18 0744 06/14/18 0431  WBC 14.2*  --   --  10.9* 11.0* 9.7  CREATININE 2.42*  --   --  2.23* 2.12* 2.21*  LATICACIDVEN  --  2.06* 1.85  --   --   --     Estimated Creatinine Clearance: 15.3 mL/min (A) (by C-G formula based on SCr of 2.21 mg/dL (H)).    Allergies  Allergen Reactions  . Codeine Swelling  . Sulfa Antibiotics Other (See Comments)    GI upset  . Biphosphate Other (See Comments)  . Morphine And Related Other (See Comments)  . Percocet [Oxycodone-Acetaminophen] Other (See Comments)  . Plavix [Clopidogrel Bisulfate] Itching and Rash    Antimicrobials this admission: Vanc 12/13>>12/14 Cefepime 12/13>>  Dose adjustments this admission: N/A  Microbiology results: 12/13 MRSA PCR - negative  12/15 Resp PCR - negative  12/13 Blood Cx - NGTD  Thank you for allowing pharmacy to be a part of this patient's care.  Azzie Roup D PGY1 Pharmacy Resident  Phone [phone] Please use AMION for clinical pharmacists numbers  06/15/2018      9:51 AM

## 2018-06-15 NOTE — Clinical Social Work Note (Signed)
Clinical Social Work Assessment  Patient Details  Name: [name] MRN: [id number] Date of Birth: 10/17/1929  Date of referral:  06/15/18               Reason for consult:  Discharge Planning                Permission sought to share information with:  Family Supports Permission granted to share information::  No(Patient oriented to person only)  Name::        Agency::     Relationship::     Contact Information:     Housing/Transportation Living arrangements for the past 2 months:  Skilled Nursing Facility([facility]) Source of Information:  Adult Children, Other (Comment Required)(Talked with admissions Mudlogger at [facility]) Patient Interpreter Needed:  None Criminal Activity/Legal Involvement Pertinent to Current Situation/Hospitalization:  No - Comment as needed Significant Relationships:  Adult Children Lives with:  Facility Resident([facility]) Do you feel safe going back to the place where you live?  Yes Need for family participation in patient care:  Yes (Comment)  Care giving concerns:  Patient's son, [name], did not express any concerns regarding patient's care at skilled nursing facility.  Social Worker assessment / plan:  CSW talked with patient's son, [name] by phone and confirmed patient is from [facility], has been there 7 years in June and will return there at discharge.   Employment status:  Retired Forensic scientist:  Information systems manager, [employer] [place] PT Recommendations:  [place] / Referral to community resources:  Other (Comment Required)(None needed or requested as patient from a skilled nursing facility)  Patient/Family's Response to care: [name] expressed no concerns regarding patient's care during hospitalization.  Patient/Family's Understanding of and Emotional Response to Diagnosis, Current Treatment, and Prognosis:  Son expressed no concerns regarding patient's hospitalization or return to SNF  today.   Emotional Assessment Appearance:  Appears stated age Attitude/Demeanor/Rapport:  Unable to Assess Affect (typically observed):  Unable to Assess Orientation:  Oriented to Self Alcohol / Substance use:  Tobacco Use, Alcohol Use, Illicit Drugs(Per H&P patient has never smoked, drank alcohol or used illicit drugs) Psych involvement (Current and /or in the community):  No (Comment)  Discharge Needs  Concerns to be addressed:  Discharge Planning Concerns Readmission within the last 30 days:  Yes Current discharge risk:  None Barriers to Discharge:  No Barriers Identified   Samantha Feil, LCSW 06/15/2018, 2:45 PM

## 2018-06-16 LAB — CULTURE, BLOOD (ROUTINE X 2)
Culture: NO GROWTH
Culture: NO GROWTH
Special Requests: ADEQUATE
Special Requests: ADEQUATE

## 2018-06-17 DIAGNOSIS — R509 Fever, unspecified: Secondary | ICD-10-CM | POA: Diagnosis not present

## 2018-06-17 DIAGNOSIS — R5383 Other fatigue: Secondary | ICD-10-CM | POA: Diagnosis not present

## 2018-06-17 DIAGNOSIS — I509 Heart failure, unspecified: Secondary | ICD-10-CM | POA: Diagnosis not present

## 2018-06-17 DIAGNOSIS — D649 Anemia, unspecified: Secondary | ICD-10-CM | POA: Diagnosis not present

## 2018-06-21 DIAGNOSIS — I70235 Atherosclerosis of native arteries of right leg with ulceration of other part of foot: Secondary | ICD-10-CM | POA: Diagnosis not present

## 2018-06-25 DIAGNOSIS — I252 Old myocardial infarction: Secondary | ICD-10-CM | POA: Diagnosis not present

## 2018-06-25 DIAGNOSIS — N189 Chronic kidney disease, unspecified: Secondary | ICD-10-CM | POA: Diagnosis not present

## 2018-06-25 DIAGNOSIS — Z7189 Other specified counseling: Secondary | ICD-10-CM | POA: Diagnosis not present

## 2018-06-25 DIAGNOSIS — I509 Heart failure, unspecified: Secondary | ICD-10-CM | POA: Diagnosis not present

## 2018-06-29 DIAGNOSIS — F411 Generalized anxiety disorder: Secondary | ICD-10-CM | POA: Diagnosis not present

## 2018-06-29 DIAGNOSIS — F339 Major depressive disorder, recurrent, unspecified: Secondary | ICD-10-CM | POA: Diagnosis not present

## 2018-07-09 ENCOUNTER — Non-Acute Institutional Stay: Payer: Medicare Other | Admitting: Primary Care

## 2018-07-12 ENCOUNTER — Non-Acute Institutional Stay: Payer: Medicare Other | Admitting: Licensed Clinical Social Worker

## 2018-07-12 DIAGNOSIS — Z515 Encounter for palliative care: Secondary | ICD-10-CM

## 2018-07-12 NOTE — Progress Notes (Signed)
COMMUNITY PALLIATIVE CARE SW NOTE  PATIENT NAME: [name] DOB: [birth year] MRN: [id number]  PRIMARY CARE PROVIDER: Patient, No Pcp Per  RESPONSIBLE PARTY:  Acct ID - Guarantor Home Phone Work Phone Relationship Acct Type  [id number] KERILYN, CORTNER* [id number] [phone] Self P/F     [address] [place], [place] [address]     PLAN OF CARE and INTERVENTIONS:             1. GOALS OF CARE/ ADVANCE CARE PLANNING:  Patient's goal is to remain in the facility.  She has a DNR. 2. SOCIAL/EMOTIONAL/SPIRITUAL ASSESSMENT/ INTERVENTIONS:  SW met with patient in her room at [facility].  Patient was alert with her lunch tray in front of her.  She stated she did not want to eat at the moment.  Consulted facility nurse, Colletta Maryland, who indicated patient must now be fed.  Patient displayed a bright affect and answered questions appropriately.  SW provided active listening while she discussed her seven year stay at the facility.  She moved to [facility] after her mother died.  Patient has two sons and one daughter in the area.  Patient said her daughter visits her daily.  Patient denied pain and stated she had no needs or questions. 3. PATIENT/CAREGIVER EDUCATION/ COPING:  Patient copes by expressing her feelings openly.  Provided education regarding the Palliative Care Program. 4. PERSONAL EMERGENCY PLAN:  Per facility protocol. 5. COMMUNITY RESOURCES COORDINATION/ HEALTH CARE NAVIGATION:  None. 6. FINANCIAL/LEGAL CONCERNS/INTERVENTIONS:  None.     SOCIAL HX:  Social History   Tobacco Use  . Smoking status: Never Smoker  . Smokeless tobacco: Never Used  Substance Use Topics  . Alcohol use: No    CODE STATUS:  DNR  ADVANCED DIRECTIVES: N MOST FORM COMPLETE:   N HOSPICE EDUCATION PROVIDED:  N PPS:  Patient's appetite has decreased per staff.  She no longer gets out of her bed. Duration of visit and documentation:  45 minutes.      [place] Corn Shed Nixon, LCSW

## 2018-07-16 ENCOUNTER — Non-Acute Institutional Stay: Payer: Medicare Other | Admitting: Primary Care

## 2018-07-16 DIAGNOSIS — Z515 Encounter for palliative care: Secondary | ICD-10-CM

## 2018-07-16 NOTE — Progress Notes (Signed)
Visit made to patient to meet her and call made to family POA to explain [facility] program. Patient alert, interactive. No exam No answer on POA VMS, message left and call back number left. No charge.

## 2018-07-19 ENCOUNTER — Telehealth: Payer: Self-pay | Admitting: Licensed Clinical Social Worker

## 2018-07-19 NOTE — Telephone Encounter (Signed)
Palliative Care SW left a vm returning sons call.

## 2018-07-21 ENCOUNTER — Non-Acute Institutional Stay: Payer: Medicare Other | Admitting: *Deleted

## 2018-07-21 VITALS — BP 122/78 | HR 87 | Temp 97.4°F | Resp 20

## 2018-07-21 DIAGNOSIS — Z515 Encounter for palliative care: Secondary | ICD-10-CM

## 2018-07-22 ENCOUNTER — Telehealth: Payer: Self-pay | Admitting: *Deleted

## 2018-07-22 NOTE — Telephone Encounter (Signed)
Received a call from patient's son [name] stating that he has spoken with his siblings, and they are ready to move forward with hospice services. Explained Referral process and sent information to hospice referral center to complete processing.

## 2018-07-22 NOTE — Progress Notes (Signed)
COMMUNITY PALLIATIVE CARE RN NOTE  PATIENT NAME: [name] DOB: 01/08/1930 MRN: [id number]  PRIMARY CARE PROVIDER: Virgel Bouquet, MD  RESPONSIBLE PARTY: [name] (son) Acct ID - Guarantor Home Phone Work Phone Relationship Acct Type  [id number] CAMEO, [phone] Self P/F     [address], [place], [place] [address]    PLAN OF CARE and INTERVENTION:  1. ADVANCE CARE PLANNING/GOALS OF CARE: Family wants comfort care and avoid hospitalizations 2. PATIENT/CAREGIVER EDUCATION: Explained Palliative Services and Hospice Education Provided 3. DISEASE STATUS: Met with patient in her room at the facility. Daughter [name] present during visit.  [name] is a 85 yo female who resides at [facility]. She has been a resident there for 6 years. She has a history of CHF, CKD Stage 4 (refuses dialysis), COPD, HTN, CVA, CAD s/p stents and GERD. She continues to decline overall. She is alert and oriented to self only, which is her baseline. She is able to answer some simple questions with short replies. Denies pain. She is on Oxygen at 2L/min via [place]. Use of accessory chest and abdominal accessory muscles with breathing while at rest. Her Oxygen sat was 93% on 2L. With any exertion it will drop to about 87%. Due to increased weakness, fatigue and work of breathing, they have been unable to get her out of bed for the past month. They used to get her up with 2 person assistance to wheelchair or recliner. She has difficulty sitting upright in a chair, as this makes her breathing worse. She was made a feeder a week ago by the facility. Her oral intake is poor. Less than 25% of meals to bites and sips. She is on a heart healthy diet with thin liquids. Her weight in July 2019 was 136 lbs and this month 132.6 lbs. Facility was getting an order for a stool softener due to issues with constipation. Mild abdominal distention noted, but she did have a large BM yesterday. She spends 90% of  her day asleep. She was falling asleep some during my visit, even though her daughter was present and we were having a conversation. She was hospitalized from 06/11/18-06/15/18 for dyspnea, weakness, hypoxia (83% on 2L), and fever of 101.6. Imaging did not confirm PNA, however they placed her on IV antibiotics anyway. She had a UTI right before admission to the hospital that facility was treating with oral Cipro. Her skin is pale and legs are cool to touch. No swelling. She is incontinent of both bowel and bladder, wears Depends and is total care. Spoke with son [name] to provide update regarding visit as requested. He is wanting to know my assessment regarding hospice eligibility. Spoke with Palliative Care NP along with hospice physician Dr. Gilford [name] who feels that patient is hospice eligible. [name] wants to meet with his siblings to decide if they want to pursue hospice services. Son to call tomorrow with family decision.    CODE STATUS: DNR  ADVANCED DIRECTIVES: Y MOST FORM: no  PPS: 30%   PHYSICAL EXAM:   VITALS: Today's Vitals   07/21/18 1336  BP: 122/78  Pulse: 87  Resp: 20  Temp: (!) 97.4 F (36.3 C)  TempSrc: Temporal  SpO2: 96%  PainSc: 0-No pain    LUNGS: clear to auscultation  CARDIAC: Cor RRR EXTREMITIES: No edema, slight abdominal distention SKIN: Skin is pale, dry and intact  NEURO: Alert and oriented to self, intermittent confusion, increased generalized weakness, total care   (Duration of visit and  documentation 120 minutes)    Samantha [place], RN, BSN
